# Patient Record
Sex: Male | Born: 1945 | Race: White | Hispanic: No | Marital: Married | Smoking: Former smoker
Health system: Southern US, Community
[De-identification: ages and names within clinical notes are randomized; demographics above are authoritative.]

## PROBLEM LIST (undated history)

## (undated) DIAGNOSIS — I739 Peripheral vascular disease, unspecified: Secondary | ICD-10-CM

## (undated) DIAGNOSIS — K036 Deposits [accretions] on teeth: Secondary | ICD-10-CM

## (undated) DIAGNOSIS — L02416 Cutaneous abscess of left lower limb: Secondary | ICD-10-CM

## (undated) DIAGNOSIS — I10 Essential (primary) hypertension: Secondary | ICD-10-CM

## (undated) DIAGNOSIS — I6529 Occlusion and stenosis of unspecified carotid artery: Secondary | ICD-10-CM

## (undated) DIAGNOSIS — K08109 Complete loss of teeth, unspecified cause, unspecified class: Secondary | ICD-10-CM

## (undated) DIAGNOSIS — E278 Other specified disorders of adrenal gland: Secondary | ICD-10-CM

## (undated) DIAGNOSIS — D329 Benign neoplasm of meninges, unspecified: Secondary | ICD-10-CM

## (undated) DIAGNOSIS — M1990 Unspecified osteoarthritis, unspecified site: Secondary | ICD-10-CM

## (undated) DIAGNOSIS — K031 Abrasion of teeth: Secondary | ICD-10-CM

## (undated) DIAGNOSIS — T7840XA Allergy, unspecified, initial encounter: Secondary | ICD-10-CM

## (undated) DIAGNOSIS — R0600 Dyspnea, unspecified: Secondary | ICD-10-CM

## (undated) DIAGNOSIS — N400 Enlarged prostate without lower urinary tract symptoms: Secondary | ICD-10-CM

## (undated) DIAGNOSIS — K0530 Chronic periodontitis, unspecified: Secondary | ICD-10-CM

## (undated) DIAGNOSIS — Z96652 Presence of left artificial knee joint: Secondary | ICD-10-CM

## (undated) DIAGNOSIS — I5032 Chronic diastolic (congestive) heart failure: Secondary | ICD-10-CM

## (undated) DIAGNOSIS — K029 Dental caries, unspecified: Secondary | ICD-10-CM

## (undated) DIAGNOSIS — G8929 Other chronic pain: Secondary | ICD-10-CM

## (undated) DIAGNOSIS — I714 Abdominal aortic aneurysm, without rupture, unspecified: Secondary | ICD-10-CM

## (undated) DIAGNOSIS — N138 Other obstructive and reflux uropathy: Secondary | ICD-10-CM

## (undated) DIAGNOSIS — I5033 Acute on chronic diastolic (congestive) heart failure: Secondary | ICD-10-CM

## (undated) DIAGNOSIS — J449 Chronic obstructive pulmonary disease, unspecified: Secondary | ICD-10-CM

## (undated) DIAGNOSIS — Z0120 Encounter for dental examination and cleaning without abnormal findings: Secondary | ICD-10-CM

## (undated) DIAGNOSIS — G4733 Obstructive sleep apnea (adult) (pediatric): Secondary | ICD-10-CM

## (undated) DIAGNOSIS — D126 Benign neoplasm of colon, unspecified: Secondary | ICD-10-CM

## (undated) DIAGNOSIS — I639 Cerebral infarction, unspecified: Secondary | ICD-10-CM

## (undated) DIAGNOSIS — M549 Dorsalgia, unspecified: Secondary | ICD-10-CM

## (undated) DIAGNOSIS — I2510 Atherosclerotic heart disease of native coronary artery without angina pectoris: Secondary | ICD-10-CM

## (undated) DIAGNOSIS — R351 Nocturia: Secondary | ICD-10-CM

## (undated) DIAGNOSIS — K032 Erosion of teeth: Secondary | ICD-10-CM

## (undated) DIAGNOSIS — G4730 Sleep apnea, unspecified: Secondary | ICD-10-CM

## (undated) DIAGNOSIS — M5030 Other cervical disc degeneration, unspecified cervical region: Secondary | ICD-10-CM

## (undated) DIAGNOSIS — K5790 Diverticulosis of intestine, part unspecified, without perforation or abscess without bleeding: Secondary | ICD-10-CM

## (undated) DIAGNOSIS — Z9889 Other specified postprocedural states: Secondary | ICD-10-CM

## (undated) DIAGNOSIS — R011 Cardiac murmur, unspecified: Secondary | ICD-10-CM

## (undated) DIAGNOSIS — K030 Excessive attrition of teeth: Secondary | ICD-10-CM

## (undated) DIAGNOSIS — N401 Enlarged prostate with lower urinary tract symptoms: Secondary | ICD-10-CM

## (undated) DIAGNOSIS — M545 Low back pain, unspecified: Secondary | ICD-10-CM

## (undated) DIAGNOSIS — I350 Nonrheumatic aortic (valve) stenosis: Secondary | ICD-10-CM

## (undated) DIAGNOSIS — H9319 Tinnitus, unspecified ear: Secondary | ICD-10-CM

## (undated) DIAGNOSIS — M109 Gout, unspecified: Secondary | ICD-10-CM

## (undated) HISTORY — DX: Complete loss of teeth, unspecified cause, unspecified class: K08.109

## (undated) HISTORY — DX: Excessive attrition of teeth: K03.0

## (undated) HISTORY — DX: Deposits (accretions) on teeth: K03.6

## (undated) HISTORY — DX: Gout, unspecified: M10.9

## (undated) HISTORY — DX: Other specified postprocedural states: Z98.890

## (undated) HISTORY — DX: Obstructive sleep apnea (adult) (pediatric): G47.33

## (undated) HISTORY — DX: Occlusion and stenosis of unspecified carotid artery: I65.29

## (undated) HISTORY — DX: Essential (primary) hypertension: I10

## (undated) HISTORY — DX: Abrasion of teeth: K03.1

## (undated) HISTORY — DX: Other obstructive and reflux uropathy: N13.8

## (undated) HISTORY — DX: Presence of left artificial knee joint: Z96.652

## (undated) HISTORY — DX: Low back pain, unspecified: M54.50

## (undated) HISTORY — PX: TRANSURETHRAL RESECTION OF PROSTATE: SHX73

## (undated) HISTORY — DX: Sleep apnea, unspecified: G47.30

## (undated) HISTORY — PX: APPENDECTOMY: SHX54

## (undated) HISTORY — DX: Unspecified osteoarthritis, unspecified site: M19.90

## (undated) HISTORY — DX: Benign neoplasm of meninges, unspecified: D32.9

## (undated) HISTORY — DX: Chronic periodontitis, unspecified: K05.30

## (undated) HISTORY — DX: Other cervical disc degeneration, unspecified cervical region: M50.30

## (undated) HISTORY — DX: Chronic diastolic (congestive) heart failure: I50.32

## (undated) HISTORY — DX: Erosion of teeth: K03.2

## (undated) HISTORY — DX: Atherosclerotic heart disease of native coronary artery without angina pectoris: I25.10

## (undated) HISTORY — DX: Other obstructive and reflux uropathy: N40.1

## (undated) HISTORY — PX: EYE SURGERY: SHX253

## (undated) HISTORY — DX: Diverticulosis of intestine, part unspecified, without perforation or abscess without bleeding: K57.90

## (undated) HISTORY — DX: Benign prostatic hyperplasia without lower urinary tract symptoms: N40.0

## (undated) HISTORY — DX: Cerebral infarction, unspecified: I63.9

## (undated) HISTORY — DX: Acute on chronic diastolic (congestive) heart failure: I50.33

## (undated) HISTORY — PX: CARPAL TUNNEL RELEASE: SHX101

## (undated) HISTORY — PX: TONSILLECTOMY: SUR1361

## (undated) HISTORY — PX: INGUINAL HERNIA REPAIR: SUR1180

## (undated) HISTORY — DX: Allergy, unspecified, initial encounter: T78.40XA

## (undated) HISTORY — DX: Encounter for dental examination and cleaning without abnormal findings: Z01.20

## (undated) HISTORY — DX: Benign neoplasm of colon, unspecified: D12.6

## (undated) HISTORY — DX: Dental caries, unspecified: K02.9

## (undated) HISTORY — PX: PROSTATE SURGERY: SHX751

## (undated) HISTORY — DX: Peripheral vascular disease, unspecified: I73.9

## (undated) HISTORY — DX: Tinnitus, unspecified ear: H93.19

## (undated) HISTORY — DX: Chronic obstructive pulmonary disease, unspecified: J44.9

## (undated) HISTORY — PX: OTHER SURGICAL HISTORY: SHX169

---

## 1989-06-05 HISTORY — PX: BRAIN MENINGIOMA EXCISION: SHX576

## 2006-05-21 ENCOUNTER — Ambulatory Visit: Payer: Self-pay | Admitting: Internal Medicine

## 2006-05-31 ENCOUNTER — Encounter (INDEPENDENT_AMBULATORY_CARE_PROVIDER_SITE_OTHER): Payer: Self-pay | Admitting: Specialist

## 2006-05-31 ENCOUNTER — Ambulatory Visit: Payer: Self-pay | Admitting: Internal Medicine

## 2009-03-31 ENCOUNTER — Encounter (INDEPENDENT_AMBULATORY_CARE_PROVIDER_SITE_OTHER): Payer: Self-pay | Admitting: *Deleted

## 2009-06-05 HISTORY — PX: UMBILICAL HERNIA REPAIR: SHX196

## 2010-01-06 ENCOUNTER — Encounter (INDEPENDENT_AMBULATORY_CARE_PROVIDER_SITE_OTHER): Payer: Self-pay | Admitting: *Deleted

## 2010-03-17 DIAGNOSIS — F32A Depression, unspecified: Secondary | ICD-10-CM

## 2010-03-17 DIAGNOSIS — R5381 Other malaise: Secondary | ICD-10-CM | POA: Insufficient documentation

## 2010-03-17 DIAGNOSIS — Z860100 Personal history of colon polyps, unspecified: Secondary | ICD-10-CM

## 2010-03-17 DIAGNOSIS — R1310 Dysphagia, unspecified: Secondary | ICD-10-CM | POA: Insufficient documentation

## 2010-03-17 DIAGNOSIS — F329 Major depressive disorder, single episode, unspecified: Secondary | ICD-10-CM | POA: Insufficient documentation

## 2010-03-17 DIAGNOSIS — K5730 Diverticulosis of large intestine without perforation or abscess without bleeding: Secondary | ICD-10-CM

## 2010-03-17 DIAGNOSIS — R5383 Other fatigue: Secondary | ICD-10-CM

## 2010-03-17 HISTORY — DX: Depression, unspecified: F32.A

## 2010-03-17 HISTORY — DX: Personal history of colonic polyps: Z86.010

## 2010-03-17 HISTORY — DX: Diverticulosis of large intestine without perforation or abscess without bleeding: K57.30

## 2010-03-17 HISTORY — DX: Personal history of colon polyps, unspecified: Z86.0100

## 2010-07-05 NOTE — Procedures (Signed)
Summary: COLON   Colonoscopy  Procedure date:  05/31/2006  Findings:      Location:  [facility].    Procedures Next Due Date:    Colonoscopy: 06/2009 Patient Name: Kyle Lewis, Kyle Lewis MRN:  Procedure Procedures: Colonoscopy CPT: 40102.    with polypectomy. CPT: A3573898.  Personnel: Endoscopist: Talya Quain L. Juanda Chance, MD.  Referred By: [name], MD.  Exam Location: Exam performed in Outpatient Clinic. Outpatient  Patient Consent: Procedure, Alternatives, Risks and Benefits discussed, consent obtained, from patient. Consent was obtained by the RN.  Indications  Average Risk Screening Routine.  History  Current Medications: Patient is not currently taking Coumadin.  Pre-Exam Physical: Performed May 31, 2006. Entire physical exam was normal.  Comments: Pt. history reviewed/updated, physical exam performed prior to initiation of sedation?yes Exam Exam: Extent of exam reached: Cecum, extent intended: Cecum.  ASA Classification: II. Tolerance: good.  Monitoring: Pulse and BP monitoring, Oximetry used. Supplemental O2 given.  Colon Prep Used Miralax for colon prep. Prep results: fair, adequate exam.  Sedation Meds: Patient assessed and found to be appropriate for moderate (conscious) sedation. Fentanyl 100 mcg. given IV. Versed 10 mg. given IV.  Findings - MULTIPLE POLYPS: Cecum. minimum size 4 mm, maximum size 5 mm. removed, Polyp retrieved, 2 polyps Polyps sent to pathology. ICD9: Colon Polyps: 211.3. Comments: 2 sessile polyps at the cecum, removed with hot biopsies.  - NORMAL EXAM: Cecum.  POLYP: Sigmoid Colon, Maximum size: 15 mm. pedunculated polyp. Distance from Anus 30 cm. Procedure:  snare with cautery, removed, retrieved, Polyp sent to pathology. ICD9: Colon Polyps: 211.3.  - DIVERTICULOSIS: Sigmoid Colon. ICD9: Diverticulosis: 562.10. Comments: mild diverticulosis.  - NORMAL EXAM: Rectum.    Comments: scope removal time  10:49  min Assessment Abnormal examination, see findings above.  Diagnoses: 211.3: Colon Polyps.  562.10: Diverticulosis.   Comments: multiple left and right colon polyps removed Events  Unplanned Interventions: No intervention was required.  Unplanned Events: There were no complications. Plans  Post Exam Instructions: No aspirin or non-steroidal containing medications: 2 weeks.  Medication Plan: Await pathology.  Patient Education: Patient given standard instructions for: Patient instructed to get routine colonoscopy every 3 years.  Disposition: After procedure patient sent to recovery. After recovery patient sent home.   This report was created from the original endoscopy report, which was reviewed and signed by the above listed endoscopist.     FINAL DIAGNOSIS    ***MICROSCOPIC EXAMINATION AND DIAGNOSIS***    1. COLON, CECAL POLYPS: TUBULAR ADENOMAS. NO HIGH GRADE   DYSPLASIA OR MALIGNANCY IDENTIFIED.    2. COLON, SIGMOID POLYP AT 30 CM: TUBULOVILLOUS ADENOMA. NO   HIGH GRADE DYSPLASIA OR MALIGNANCY IDENTIFIED.    cc   Date Reported: 06/04/2006 Beulah Gandy. Luisa Hart, MD   *** Electronically Signed Out By JDP ***    Clinical information   Screening. R/O adenoma (mj)    specimen(s) obtained   1: Colon, polyp(s), cecum   2: Colon, polyp(s), sigmoid    Gross Description   1. Received in formalin are tan, soft tissue fragments that are   submitted in toto. Number: two.   Size: 0.3 and 0.4 cm    2. Received in formalin is a polypoid portion of soft dark   reddish brown tissue with a smooth external surface that measures   1 x 0.9 x 0.8 cm. The base involves an area measuring 0.5 cm.   Block Summary: The specimen is bisected and submitted entirely in   one cassette.  JBM:mw 06/01/06    mw/

## 2010-07-05 NOTE — Letter (Signed)
Summary: [facility]   Imported By: Lamona Curl CMA (AAMA) 03/18/2010 15:04:40  _____________________________________________________________________  External Attachment:    Type:   Image     Comment:   External Document

## 2011-06-06 DIAGNOSIS — I639 Cerebral infarction, unspecified: Secondary | ICD-10-CM

## 2011-06-06 HISTORY — DX: Cerebral infarction, unspecified: I63.9

## 2011-07-13 ENCOUNTER — Telehealth: Payer: Self-pay | Admitting: Internal Medicine

## 2011-07-13 NOTE — Telephone Encounter (Signed)
Spoke with patient's wife. States he has a bulge where he had mesh put in for umbilical hernia and wants Dr. Juanda Chance to look at it the same time his wife is coming on 08/04/11. Scheduled patient at 3:15 PM on 08/04/11.

## 2011-07-13 NOTE — Telephone Encounter (Signed)
Line busy - will try again later

## 2011-07-19 ENCOUNTER — Encounter: Payer: Self-pay | Admitting: Internal Medicine

## 2011-07-27 ENCOUNTER — Encounter: Payer: Self-pay | Admitting: *Deleted

## 2011-08-04 ENCOUNTER — Ambulatory Visit (INDEPENDENT_AMBULATORY_CARE_PROVIDER_SITE_OTHER): Payer: Medicare Other | Admitting: Internal Medicine

## 2011-08-04 ENCOUNTER — Encounter: Payer: Self-pay | Admitting: Internal Medicine

## 2011-08-04 DIAGNOSIS — Z86010 Personal history of colonic polyps: Secondary | ICD-10-CM

## 2011-08-04 DIAGNOSIS — K439 Ventral hernia without obstruction or gangrene: Secondary | ICD-10-CM

## 2011-08-04 DIAGNOSIS — Z1211 Encounter for screening for malignant neoplasm of colon: Secondary | ICD-10-CM

## 2011-08-04 MED ORDER — PEG-KCL-NACL-NASULF-NA ASC-C 100 G PO SOLR: 1.0000 | Freq: Once | ORAL | Status: DC

## 2011-08-04 NOTE — Progress Notes (Signed)
Kyle Lewis 1946/03/30 MRN [id number]    History of Present Illness:  This is a 66 year old white male with a large umbilical hernia. He is here to discuss the hernia as well as discuss a recall colonoscopy. About 3 years ago, he underwent repair of a small umbilical hernia. About 1 week after his discharge, he developed severe abdominal pain and projectile vomiting and was admitted for a small bowel obstruction. He underwent  exploratory laparotomy for incarcerated hernia and placement of mesh in the anterior abdominal wall. Almost immediately after the second surgery, he developed a recurrent hernia which has  Persisted and has enlarged somewhat. He has a history of adenomatous polyps of the colon on a colonoscopy in December 2007. One was a tubular adenoma and one was a tubovillous adenoma. He is due for a recall colonoscopy. He denies any abdominal pain, nausea, vomiting or change in bowel habits.    Past Medical History  Diagnosis Date  . Diverticulosis   . Adenomatous colon polyp   . Sleep apnea    Past Surgical History  Procedure Date  . Umbilical hernia repair   . Tonsillectomy   . Appendectomy   . Brain meningioma excision   . Carpal tunnel release     left    reports that he has been smoking.  He has never used smokeless tobacco. He reports that he drinks alcohol. He reports that he does not use illicit drugs. family history includes Prostate cancer in his maternal grandfather.  There is no history of Colon cancer. No Known Allergies      Review of Systems: Denies heartburn, nausea vomiting or diarrhea  The remainder of the 10 point ROS is negative except as outlined in H&P   Physical Exam: General appearance  Well developed, in no distress. Obese Eyes- non icteric. HEENT nontraumatic, normocephalic. Mouth no lesions, tongue papillated, no cheilosis. Neck supple without adenopathy, thyroid not enlarged, no carotid bruits, no JVD. Lungs Clear to auscultation  bilaterally. Cor normal S1, normal S2, regular rhythm, no murmur,  quiet precordium. Abdomen: Large vertical scar with large ventral hernia 8 cm in diameter easily reducible. Nontender. Bowel sounds are normal active. Noted there is no tympany. Rectal: Not done Extremities no pedal edema. Skin no lesions. Neurological alert and oriented x 3. Psychological normal mood and affect.  Assessment and Plan:  Problem #1 History of adenomatous polyps. Patient is due for a recall colonoscopy which will be scheduled.  Problem #2 Recurrent ventral hernia. He is status post umbilical hernia repair about 4 years ago  and placement of mesh for incarcerated hernia immediately following the first surgery.. I advised patient not to rush into another surgery since here is no evidence of obstruction or incarceration. He is morbidly obese  and another surgery to repair the hernia  would be very complicated. I strongly advised him to lose weight and wear an  abdominal binder.    08/04/2011 Kyle Lewis

## 2011-08-04 NOTE — Patient Instructions (Addendum)
You have been scheduled for a colonoscopy with propofol. Please follow written instructions given to you at your visit today.  Please pick up your prep kit at the pharmacy within the next 1-3 days. Dr Kathie Rhodes. [employer]

## 2011-08-05 ENCOUNTER — Encounter: Payer: Self-pay | Admitting: Internal Medicine

## 2011-08-07 ENCOUNTER — Encounter: Payer: Self-pay | Admitting: Internal Medicine

## 2011-09-06 ENCOUNTER — Encounter: Payer: Self-pay | Admitting: Internal Medicine

## 2011-09-06 ENCOUNTER — Ambulatory Visit (AMBULATORY_SURGERY_CENTER): Payer: Medicare Other | Admitting: Internal Medicine

## 2011-09-06 VITALS — BP 152/113 | HR 66 | Temp 97.2°F | Resp 22 | Ht 66.0 in | Wt 273.0 lb

## 2011-09-06 DIAGNOSIS — Z1211 Encounter for screening for malignant neoplasm of colon: Secondary | ICD-10-CM

## 2011-09-06 DIAGNOSIS — D126 Benign neoplasm of colon, unspecified: Secondary | ICD-10-CM

## 2011-09-06 MED ORDER — SODIUM CHLORIDE 0.9 % IV SOLN: 500.0000 mL | INTRAVENOUS | Status: DC

## 2011-09-06 NOTE — Progress Notes (Signed)
Patient did not experience any of the following events: a burn prior to discharge; a fall within the facility; wrong site/side/patient/procedure/implant event; or a hospital transfer or hospital admission upon discharge from the facility. (G8907) Patient did not have preoperative order for IV antibiotic SSI prophylaxis. (G8918)  

## 2011-09-06 NOTE — Progress Notes (Signed)
The pt tolerated the colonoscopy very well. Maw   

## 2011-09-06 NOTE — Op Note (Signed)
[facility] [address] [place], [place]  [address]  COLONOSCOPY PROCEDURE REPORT  PATIENT:  Kyle, Lewis  MR#:  [id number] BIRTHDATE:  Apr 11, 1946, 65 yrs. old  GENDER:  male ENDOSCOPIST:  Hedwig Morton. Juanda Chance, MD REF. BY:  [name], M.D. PROCEDURE DATE:  09/06/2011 PROCEDURE:  Colonoscopy with biopsy and snare polypectomy ASA CLASS:  Class II INDICATIONS:  history of pre-cancerous (adenomatous) colon polyps aden.polyp 2008 MEDICATIONS:   MAC sedation, administered by CRNA, propofol (Diprivan) 250 mg  DESCRIPTION OF PROCEDURE:   After the risks and benefits and of the procedure were explained, informed consent was obtained. Digital rectal exam was performed and revealed no rectal masses. The LB 180AL [id number] endoscope was introduced through the anus and advanced to the cecum, which was identified by both the appendix and ileocecal valve.  The quality of the prep was good, using MoviPrep.  The instrument was then slowly withdrawn as the colon was fully examined. <<PROCEDUREIMAGES>>  FINDINGS:  Four polyps were found. 2 polyps 5 mm at 30 cm, 1 polyp 8 mm at 15 cm, 1 polyp 18 mm at 65cm, al;l bu t one polyp not recovered The polyps were removed using cold biopsy forceps. Polyp was snared without cautery. Retrieval was successful. snare polyp Polyp was snared without cautery. Retrieval was unsuccessful (see image1, image3, and image12). snare polyp  Arteriovenous malformations were seen in the in the cecum (see image9, image6, image5, and image4). 3 flame shaped avm"s  Moderate diverticulosis was found in the sigmoid colon (see image2 and image11).  This was otherwise a normal examination of the colon (see image13, image7, image6, image3, and image4).   Retroflexed views in the rectum revealed no abnormalities.    The scope was then withdrawn from the patient and the procedure completed.  COMPLICATIONS:  None ENDOSCOPIC IMPRESSION: 1) Four polyps 2) AVM's in the  cecum 3) Moderate diverticulosis in the sigmoid colon 4) Otherwise normal examination RECOMMENDATIONS: 1) Await pathology results 2) High fiber diet.  REPEAT EXAM:  In 5 year(s) for.  ______________________________ Hedwig Morton. Juanda Chance, MD  CC:  n. eSIGNED:   Hedwig Morton. Travell Desaulniers at 09/06/2011 03:19 PM  Holley Bouche, [id number]

## 2011-09-06 NOTE — Patient Instructions (Signed)
YOU HAD AN ENDOSCOPIC PROCEDURE TODAY AT THE [facility]: Refer to the procedure report that was given to you for any specific questions about what was found during the examination.  If the procedure report does not answer your questions, please call your gastroenterologist to clarify.  If you requested that your care partner not be given the details of your procedure findings, then the procedure report has been included in a sealed envelope for you to review at your convenience later.  YOU SHOULD EXPECT: Some feelings of bloating in the abdomen. Passage of more gas than usual.  Walking can help get rid of the air that was put into your GI tract during the procedure and reduce the bloating. If you had a lower endoscopy (such as a colonoscopy or flexible sigmoidoscopy) you may notice spotting of blood in your stool or on the toilet paper. If you underwent a bowel prep for your procedure, then you may not have a normal bowel movement for a few days.  DIET: Your first meal following the procedure should be a light meal and then it is ok to progress to your normal diet.  A half-sandwich or bowl of soup is an example of a good first meal.  Heavy or fried foods are harder to digest and may make you feel nauseous or bloated.  Likewise meals heavy in dairy and vegetables can cause extra gas to form and this can also increase the bloating.  Drink plenty of fluids but you should avoid alcoholic beverages for 24 hours.  ACTIVITY: Your care partner should take you home directly after the procedure.  You should plan to take it easy, moving slowly for the rest of the day.  You can resume normal activity the day after the procedure however you should NOT DRIVE or use heavy machinery for 24 hours (because of the sedation medicines used during the test).    SYMPTOMS TO REPORT IMMEDIATELY: A gastroenterologist can be reached at any hour.  During normal business hours, 8:30 AM to 5:00 PM Monday through Friday,  call [phone].  After hours and on weekends, please call the GI answering service at [phone] who will take a message and have the physician on call contact you.   Following lower endoscopy (colonoscopy or flexible sigmoidoscopy):  Excessive amounts of blood in the stool  Significant tenderness or worsening of abdominal pains  Swelling of the abdomen that is new, acute  Fever of 100F or higher    FOLLOW UP: If any biopsies were taken you will be contacted by phone or by letter within the next 1-3 weeks.  Call your gastroenterologist if you have not heard about the biopsies in 3 weeks.  Our staff will call the home number listed on your records the next business day following your procedure to check on you and address any questions or concerns that you may have at that time regarding the information given to you following your procedure. This is a courtesy call and so if there is no answer at the home number and we have not heard from you through the emergency physician on call, we will assume that you have returned to your regular daily activities without incident.  SIGNATURES/CONFIDENTIALITY: You and/or your care partner have signed paperwork which will be entered into your electronic medical record.  These signatures attest to the fact that that the information above on your After Visit Summary has been reviewed and is understood.  Full responsibility of the confidentiality   of this discharge information lies with you and/or your care-partner.     

## 2011-09-07 ENCOUNTER — Telehealth: Payer: Self-pay | Admitting: *Deleted

## 2011-09-07 NOTE — Telephone Encounter (Signed)
  Follow up Call-  Call back number 09/06/2011  Post procedure Call Back phone  # cell [phone], house [phone]  Permission to leave phone message Yes     Patient questions:  Do you have a fever, pain , or abdominal swelling? no Pain Score  0 *  Have you tolerated food without any problems? yes  Have you been able to return to your normal activities? yes  Do you have any questions about your discharge instructions: Diet   no Medications  no Follow up visit  no  Do you have questions or concerns about your Care? no  Actions: * If pain score is 4 or above: No action needed, pain <4. Pt states tell dr Juanda Chance i appreciate everything. ewm

## 2011-09-12 ENCOUNTER — Encounter: Payer: Self-pay | Admitting: Internal Medicine

## 2011-09-19 LAB — HM COLONOSCOPY

## 2012-03-06 ENCOUNTER — Other Ambulatory Visit: Payer: Self-pay | Admitting: Orthopedic Surgery

## 2012-03-06 MED ORDER — BUPIVACAINE 0.25 % ON-Q PUMP SINGLE CATH 300ML: 300.0000 mL | INJECTION | Status: DC

## 2012-03-06 MED ORDER — DEXAMETHASONE SODIUM PHOSPHATE 10 MG/ML IJ SOLN: 10.0000 mg | Freq: Once | INTRAMUSCULAR | Status: DC

## 2012-03-06 NOTE — Progress Notes (Signed)
Preoperative surgical orders have been place into the Epic hospital system for Holley Bouche on 03/06/2012, 9:17 AM  by Patrica Duel for surgery on 05/17/2012.  Preop Total Knee orders including Bupivacaine On-Q pump, IV Tylenol, and IV Decadron as long as there are no contraindications to the above medications. Avel Peace, PA-C

## 2012-03-07 ENCOUNTER — Other Ambulatory Visit: Payer: Self-pay | Admitting: Orthopedic Surgery

## 2012-03-07 MED ORDER — DEXAMETHASONE SODIUM PHOSPHATE 10 MG/ML IJ SOLN: 10.0000 mg | Freq: Once | INTRAMUSCULAR | Status: DC

## 2012-03-07 MED ORDER — BUPIVACAINE 0.25 % ON-Q PUMP SINGLE CATH 300ML: 300.0000 mL | INJECTION | Status: DC

## 2012-03-21 ENCOUNTER — Encounter (HOSPITAL_COMMUNITY): Payer: Self-pay | Admitting: Pharmacy Technician

## 2012-03-25 ENCOUNTER — Ambulatory Visit (HOSPITAL_COMMUNITY)
Admission: RE | Admit: 2012-03-25 | Discharge: 2012-03-25 | Disposition: A | Discharge status: Home / Self Care | Payer: Medicare Other | Source: Ambulatory Visit | Attending: Orthopedic Surgery | Admitting: Orthopedic Surgery

## 2012-03-25 ENCOUNTER — Encounter (HOSPITAL_COMMUNITY): Payer: Self-pay

## 2012-03-25 ENCOUNTER — Encounter (HOSPITAL_COMMUNITY)
Admission: RE | Admit: 2012-03-25 | Discharge: 2012-03-25 | Disposition: A | Discharge status: Home / Self Care | Payer: Medicare Other | Source: Ambulatory Visit | Attending: Orthopedic Surgery | Admitting: Orthopedic Surgery

## 2012-03-25 DIAGNOSIS — M1710 Unilateral primary osteoarthritis, unspecified knee: Secondary | ICD-10-CM | POA: Insufficient documentation

## 2012-03-25 DIAGNOSIS — Z01812 Encounter for preprocedural laboratory examination: Secondary | ICD-10-CM | POA: Insufficient documentation

## 2012-03-25 DIAGNOSIS — I700 Atherosclerosis of aorta: Secondary | ICD-10-CM | POA: Insufficient documentation

## 2012-03-25 HISTORY — DX: Nocturia: R35.1

## 2012-03-25 HISTORY — DX: Other chronic pain: G89.29

## 2012-03-25 HISTORY — DX: Other specified disorders of adrenal gland: E27.8

## 2012-03-25 HISTORY — DX: Dorsalgia, unspecified: M54.9

## 2012-03-25 LAB — COMPREHENSIVE METABOLIC PANEL
ALT: 25 U/L (ref 0–53)
AST: 19 U/L (ref 0–37)
Albumin: 4 g/dL (ref 3.5–5.2)
Alkaline Phosphatase: 93 U/L (ref 39–117)
BUN: 11 mg/dL (ref 6–23)
CO2: 30 mEq/L (ref 19–32)
Calcium: 9.7 mg/dL (ref 8.4–10.5)
Chloride: 101 mEq/L (ref 96–112)
Creatinine, Ser: 0.56 mg/dL (ref 0.50–1.35)
GFR calc Af Amer: >90 mL/min (ref >90)
GFR calc non Af Amer: >90 mL/min (ref >90)
Glucose, Bld: 118 mg/dL — ABNORMAL HIGH (ref 70–99)
Potassium: 4.2 mEq/L (ref 3.5–5.1)
Sodium: 139 mEq/L (ref 135–145)
Total Bilirubin: 0.4 mg/dL (ref 0.3–1.2)
Total Protein: 7.1 g/dL (ref 6.0–8.3)

## 2012-03-25 LAB — URINALYSIS, ROUTINE W REFLEX MICROSCOPIC
Bilirubin Urine: NEGATIVE
Glucose, UA: NEGATIVE mg/dL
Hgb urine dipstick: NEGATIVE
Ketones, ur: NEGATIVE mg/dL
Leukocytes, UA: NEGATIVE
Nitrite: NEGATIVE
Protein, ur: NEGATIVE mg/dL
Specific Gravity, Urine: 1.015 (ref 1.005–1.030)
Urobilinogen, UA: 0.2 mg/dL (ref 0.0–1.0)
pH: 5 (ref 5.0–8.0)

## 2012-03-25 LAB — APTT: aPTT: 28 seconds (ref 24–37)

## 2012-03-25 LAB — CBC
HCT: 45.1 % (ref 39.0–52.0)
Hemoglobin: 16 g/dL (ref 13.0–17.0)
MCH: 32.1 pg (ref 26.0–34.0)
MCHC: 35.5 g/dL (ref 30.0–36.0)
MCV: 90.6 fL (ref 78.0–100.0)
Platelets: 276 10*3/uL (ref 150–400)
RBC: 4.98 MIL/uL (ref 4.22–5.81)
RDW: 13.3 % (ref 11.5–15.5)
WBC: 10.3 10*3/uL (ref 4.0–10.5)

## 2012-03-25 LAB — SURGICAL PCR SCREEN
MRSA, PCR: POSITIVE — AB
Staphylococcus aureus: POSITIVE — AB

## 2012-03-25 LAB — PROTIME-INR
INR: 0.94 (ref 0.00–1.49)
Prothrombin Time: 12.5 seconds (ref 11.6–15.2)

## 2012-03-25 MED ORDER — CHLORHEXIDINE GLUCONATE 4 % EX LIQD
60.0000 mL | Freq: Once | CUTANEOUS | Status: DC
  Filled 2012-03-25: qty 60

## 2012-03-25 NOTE — Patient Instructions (Addendum)
Quadry Kampa  03/25/2012                           YOUR PROCEDURE IS SCHEDULED ON: 04/01/12 AT 11:15 AM               PLEASE REPORT TO SHORT STAY CENTER AT :  8:45 AM               CALL THIS NUMBER IF ANY PROBLEMS THE DAY OF SURGERY :               [phone]                      REMEMBER:   Do not eat food or drink liquids AFTER MIDNIGHT    Take these medicines the morning of surgery with A SIP OF WATER: PERCOCET   Do not wear jewelry, make-up   Do not wear lotions, powders, or perfumes.   Do not shave legs or underarms 12 hrs. before surgery (men may shave face)  Do not bring valuables to the hospital.  Contacts, dentures or bridgework may not be worn into surgery.  Leave suitcase in the car. After surgery it may be brought to your room.  For patients admitted to the hospital more than one night, checkout time is 11:00                          The day of discharge.   Patients discharged the day of surgery will not be allowed to drive home                             If going home same day of surgery, must have someone stay with you first                           24 hrs at home and arrange for some one to drive you home from hospital.    Special Instructions:   Please read over the following fact sheets that you were given:               1. MRSA  INFORMATION                      2. [facility] PREPARING FOR SURGERY SHEET                    3. INCENTIVE SPIROMETER               4. BRING C-PAP MASK AND TUBING TO HOSPITAL                                                X_____________________________________________________________________

## 2012-03-31 ENCOUNTER — Other Ambulatory Visit: Payer: Self-pay | Admitting: Orthopedic Surgery

## 2012-03-31 NOTE — H&P (Signed)
Kyle Lewis  DOB: 07/10/1945 Married / Language: English / Race: White Male  Date of Admission:  04/01/2012  Chief Complaint:  Left Knee Pain  History of Present Illness The patient is a 65 year old male who comes in for a preoperative History and Physical. The patient is scheduled for a left total knee arthroplasty to be performed by Dr. Frank V. Aluisio, MD at [facility]. The patient is a 65 year old male who presents with knee complaints. The patient reports left knee symptoms including: pain, instability, giving way and weakness which began year(s) ago without any known injury. The patient describes the severity of the symptoms as severe.The patient feels that the symptoms are worsening. The patient has the current diagnosis of knee osteoarthritis. Prior to being seen today the patient was previously evaluated in this clinic (Dr. Aplington). Previous work-up for this problem has included knee x-rays. Past treatment for this problem has included intra-articular injection of corticosteroids (as well as Euflexxa. The Euflexxa didn't really help at all). The patient was last seen in clinic 7 month(s) ago (had cortisone injection then). Current treatment includes knee brace and opioid analgesics (taking Oxycodone 10mg, mostly for his back pain). He complains of pain at all times. He states that he's been unable to walk recently because of increased pain. Recently, he had a marked increase in acute onset of low back pain. This made it almost impossible for him to get around. Dr. Ramos did an injection and put him on analgesics, and the back is better, but the knee is what is bothering him the most. He states that he needs his knee replaced and wants me to do it immediately.  He is definitely ready to proceed with surgery. They have been treated conservatively in the past for the above stated problem and despite conservative measures, they continue to have progressive pain  and severe functional limitations and dysfunction. They have failed non-operative management including home exercise, medications, and injections. It is felt that they would benefit from undergoing total joint replacement. Risks and benefits of the procedure have been discussed with the patient and they elect to proceed with surgery. There are no active contraindications to surgery such as ongoing infection or rapidly progressive neurological disease.  Problem List Osteoarthrosis NOS, lower leg (715.96). 10/05/2010   Allergies No Known Drug Allergies   Family History Heart Disease. mother, father and grandfather fathers side, mother and father Diabetes Mellitus. father Hypertension. mother Osteoarthritis. mother Cancer. grandfather mothers side Congestive Heart Failure. mother Chronic Obstructive Lung Disease. mother   Social History Children. 2 Alcohol use. former drinker current drinker; only occasionally per week Drug/Alcohol Rehab (Currently). no Current work status. working full time Drug/Alcohol Rehab (Previously). no Tobacco use. current every day smoker; smoke(d) 3 or more pack(s) per day smoke(d) 1 pack(s) per day Tobacco / smoke exposure. yes no Living situation. live with spouse Illicit drug use. no Pain Contract. no Marital status. married Previously in rehab. no Number of flights of stairs before winded. less than 1 Most recent primary occupation. truck driver Exercise. Exercises never   Medication History Tamsulosin HCl (0.4MG Capsule, Oral) Active. Losartan Potassium-HCTZ (100-25MG Tablet, Oral) Active. Finasteride (5MG Tablet, Oral) Active. Meloxicam ( Oral) Specific dose unknown - Active. OxyCODONE HCl (10MG Tablet, 1 (one) Tablet Oral four times daily, as needed, Taken starting 02/13/2012) Active. Omega-3 Fatty Acids (300MG Capsule, Oral) Active. Aspirin EC (81MG Tablet DR, Oral) Active.   Past Surgical  History Tonsillectomy Prostatectomy; Transurethral   Inguinal Hernia Repair. open: bilateral Carpal Tunnel Repair. right left Appendectomy Colon Polyp Removal - Colonoscopy Cataract Surgery. bilateral   Medical History High blood pressure Chronic Pain Osteoarthritis Anxiety Disorder Sleep Apnea. uses CPAP Hemorrhoids Scarlet Fever. Childhood Measles Mumps Diverticulosis   Review of Systems General:Not Present- Chills, Fever, Night Sweats, Fatigue, Weight Gain, Weight Loss and Memory Loss. Skin:Not Present- Hives, Itching, Rash, Eczema and Lesions. HEENT:Not Present- Tinnitus, Headache, Double Vision, Visual Loss, Hearing Loss and Dentures. Respiratory:Not Present- Shortness of breath with exertion, Shortness of breath at rest, Allergies, Coughing up blood and Chronic Cough. Cardiovascular:Not Present- Chest Pain, Racing/skipping heartbeats, Difficulty Breathing Lying Down, Murmur, Swelling and Palpitations. Gastrointestinal:Not Present- Bloody Stool, Heartburn, Abdominal Pain, Vomiting, Nausea, Constipation, Diarrhea, Difficulty Swallowing, Jaundice and Loss of appetitie. Male Genitourinary:Present- Urinary frequency and Urinating at Night. Not Present- Blood in Urine, Weak urinary stream, Discharge, Flank Pain, Incontinence, Painful Urination, Urgency and Urinary Retention. Musculoskeletal:Present- Muscle Weakness, Joint Pain and Back Pain. Not Present- Muscle Pain, Joint Swelling, Morning Stiffness and Spasms. Neurological:Not Present- Tremor, Dizziness, Blackout spells, Paralysis, Difficulty with balance and Weakness. Psychiatric:Not Present- Insomnia.   Vitals Weight: 260 lb Height: 67 in Body Surface Area: 2.36 m Body Mass Index: 40.72 kg/m Pulse: 64 (Regular) Resp.: 12 (Unlabored) BP: 138/58 (Sitting, Right Arm, Standard)    Physical Exam The physical exam findings are as follows:  Patient is a 65 year old male with continued knee  pain. Patient is accompanied today by with wife.   General Mental Status - Alert, cooperative and good historian. General Appearance- pleasant. Not in acute distress. Orientation- Oriented X3. Build & Nutrition- Well nourished and Well developed.   Head and Neck Head- normocephalic, atraumatic . Neck Global Assessment- bruit auscultated on the right, bruit auscultated on the left and supple.   Eye Pupil- Bilateral- Regular and Round. Motion- Bilateral- EOMI.   Chest and Lung Exam Auscultation: Breath sounds:- clear at anterior chest wall and - clear at posterior chest wall. Adventitious sounds:- No Adventitious sounds.   Cardiovascular Auscultation:Rhythm- Regular rate and rhythm. Heart Sounds- S1 WNL and S2 WNL. Murmurs & Other Heart Sounds: Murmur 1:Location- Aortic Area. Timing- Early systolic. Grade- II/VI. Character- Low pitched.   Abdomen Inspection:Contour- Generalized moderate distention. Palpation/Percussion:Tenderness- Abdomen is non-tender to palpation. Rigidity (guarding)- Abdomen is soft. Auscultation:Auscultation of the abdomen reveals - Bowel sounds normal.   Male Genitourinary Not done, not pertinent to present illness  Musculoskeletal On exam, well-developed male, alert and oriented, in no apparent distress. Evaluation of his hips shows normal ROM and no discomfort. Right knee exam shows range 0-135, no swelling, tenderness or instability, only slight crepitus on ROM. Left knee shows a significant varus deformity, range 5-115. Marked crepitus on ROM. Tender medial greater than lateral. No true laxity, just pseudolaxity due to the deformity. Pulses, sensation and motor are intact distally.  RADIOGRAPHS: AP of both knees and a lateral show severe advanced end stage arthritis of the left knee with significant tibial subluxation, bone on bone, all 3 compartments, but worse medially.  Assessment & Plan Osteoarthrosis  NOS, lower leg (715.96) Impression: Left Knee  Note: Patient is for a Left Total Knee Replacement by Dr. Aluisio.  Plan is to go to [facility].  Drew Perkins, PA-C  

## 2012-04-01 ENCOUNTER — Inpatient Hospital Stay (HOSPITAL_COMMUNITY): Payer: Medicare Other | Admitting: Anesthesiology

## 2012-04-01 ENCOUNTER — Encounter (HOSPITAL_COMMUNITY)
Admission: RE | Disposition: A | Discharge status: Skilled Nursing Facility | Payer: Self-pay | Source: Ambulatory Visit | Attending: Orthopedic Surgery

## 2012-04-01 ENCOUNTER — Encounter (HOSPITAL_COMMUNITY): Payer: Self-pay | Admitting: Anesthesiology

## 2012-04-01 ENCOUNTER — Encounter (HOSPITAL_COMMUNITY): Payer: Self-pay

## 2012-04-01 ENCOUNTER — Inpatient Hospital Stay (HOSPITAL_COMMUNITY)
Admission: RE | Admit: 2012-04-01 | Discharge: 2012-04-04 | DRG: 470 | Disposition: A | Discharge status: Skilled Nursing Facility | Payer: Medicare Other | Source: Ambulatory Visit | Attending: Orthopedic Surgery | Admitting: Orthopedic Surgery

## 2012-04-01 DIAGNOSIS — G4730 Sleep apnea, unspecified: Secondary | ICD-10-CM | POA: Diagnosis present

## 2012-04-01 DIAGNOSIS — M179 Osteoarthritis of knee, unspecified: Secondary | ICD-10-CM

## 2012-04-01 DIAGNOSIS — D62 Acute posthemorrhagic anemia: Secondary | ICD-10-CM | POA: Diagnosis not present

## 2012-04-01 DIAGNOSIS — Z6841 Body Mass Index (BMI) 40.0 and over, adult: Secondary | ICD-10-CM

## 2012-04-01 DIAGNOSIS — E876 Hypokalemia: Secondary | ICD-10-CM | POA: Diagnosis not present

## 2012-04-01 DIAGNOSIS — Z96659 Presence of unspecified artificial knee joint: Secondary | ICD-10-CM

## 2012-04-01 DIAGNOSIS — M1710 Unilateral primary osteoarthritis, unspecified knee: Principal | ICD-10-CM | POA: Diagnosis present

## 2012-04-01 DIAGNOSIS — I10 Essential (primary) hypertension: Secondary | ICD-10-CM | POA: Diagnosis present

## 2012-04-01 HISTORY — PX: TOTAL KNEE ARTHROPLASTY: SHX125

## 2012-04-01 HISTORY — PX: JOINT REPLACEMENT: SHX530

## 2012-04-01 HISTORY — DX: Osteoarthritis of knee, unspecified: M17.9

## 2012-04-01 LAB — TYPE AND SCREEN
ABO/RH(D): O POS
Antibody Screen: NEGATIVE

## 2012-04-01 LAB — ABO/RH: ABO/RH(D): O POS

## 2012-04-01 SURGERY — ARTHROPLASTY, KNEE, TOTAL
Anesthesia: Spinal | Site: Knee | Laterality: Left | Wound class: Clean

## 2012-04-01 MED ORDER — SODIUM CHLORIDE 0.9 % IV SOLN
INTRAVENOUS | Status: DC
  Administered 2012-04-01 – 2012-04-02 (×2): via INTRAVENOUS

## 2012-04-01 MED ORDER — CEFAZOLIN SODIUM-DEXTROSE 2-3 GM-% IV SOLR
2.0000 g | Freq: Four times a day (QID) | INTRAVENOUS | Status: AC
  Administered 2012-04-01 – 2012-04-02 (×2): 2 g via INTRAVENOUS
  Filled 2012-04-01 (×2): qty 50

## 2012-04-01 MED ORDER — DEXTROSE 5 % IV SOLN
500.0000 mg | Freq: Four times a day (QID) | INTRAVENOUS | Status: DC | PRN
  Administered 2012-04-01: 500 mg via INTRAVENOUS
  Filled 2012-04-01 (×2): qty 5

## 2012-04-01 MED ORDER — BISACODYL 10 MG RE SUPP: 10.0000 mg | Freq: Every day | RECTAL | Status: DC | PRN

## 2012-04-01 MED ORDER — SODIUM CHLORIDE 0.9 % IR SOLN
Status: DC | PRN
  Administered 2012-04-01: 1000 mL

## 2012-04-01 MED ORDER — DEXAMETHASONE 6 MG PO TABS
10.0000 mg | ORAL_TABLET | Freq: Once | ORAL | Status: AC
  Administered 2012-04-02: 10 mg via ORAL
  Filled 2012-04-01: qty 1

## 2012-04-01 MED ORDER — ONDANSETRON HCL 4 MG PO TABS: 4.0000 mg | ORAL_TABLET | Freq: Four times a day (QID) | ORAL | Status: DC | PRN

## 2012-04-01 MED ORDER — METOCLOPRAMIDE HCL 10 MG PO TABS: 5.0000 mg | ORAL_TABLET | Freq: Three times a day (TID) | ORAL | Status: DC | PRN

## 2012-04-01 MED ORDER — EPHEDRINE SULFATE 50 MG/ML IJ SOLN
INTRAMUSCULAR | Status: DC | PRN
  Administered 2012-04-01: 5 mg via INTRAVENOUS

## 2012-04-01 MED ORDER — HYDROMORPHONE HCL PF 1 MG/ML IJ SOLN: 0.2500 mg | INTRAMUSCULAR | Status: DC | PRN

## 2012-04-01 MED ORDER — PROPOFOL 10 MG/ML IV BOLUS
INTRAVENOUS | Status: DC | PRN
  Administered 2012-04-01 (×5): 20 mg via INTRAVENOUS

## 2012-04-01 MED ORDER — LACTATED RINGERS IV SOLN
INTRAVENOUS | Status: DC | PRN
  Administered 2012-04-01 (×2): via INTRAVENOUS

## 2012-04-01 MED ORDER — LACTATED RINGERS IV SOLN: INTRAVENOUS | Status: DC

## 2012-04-01 MED ORDER — BUPIVACAINE 0.25 % ON-Q PUMP SINGLE CATH 300ML
INJECTION | Status: DC | PRN
  Administered 2012-04-01: 300 mL

## 2012-04-01 MED ORDER — DEXAMETHASONE SODIUM PHOSPHATE 10 MG/ML IJ SOLN
INTRAMUSCULAR | Status: DC | PRN
  Administered 2012-04-01: 10 mg via INTRAVENOUS

## 2012-04-01 MED ORDER — DIPHENHYDRAMINE HCL 12.5 MG/5ML PO ELIX: 12.5000 mg | ORAL_SOLUTION | ORAL | Status: DC | PRN

## 2012-04-01 MED ORDER — METHOCARBAMOL 500 MG PO TABS
500.0000 mg | ORAL_TABLET | Freq: Four times a day (QID) | ORAL | Status: DC | PRN
  Administered 2012-04-02 – 2012-04-04 (×7): 500 mg via ORAL
  Filled 2012-04-01 (×8): qty 1

## 2012-04-01 MED ORDER — ACETAMINOPHEN 650 MG RE SUPP: 650.0000 mg | Freq: Four times a day (QID) | RECTAL | Status: DC | PRN

## 2012-04-01 MED ORDER — CEFAZOLIN SODIUM-DEXTROSE 2-3 GM-% IV SOLR
2.0000 g | INTRAVENOUS | Status: AC
  Administered 2012-04-01: 2 g via INTRAVENOUS

## 2012-04-01 MED ORDER — RIVAROXABAN 10 MG PO TABS
10.0000 mg | ORAL_TABLET | Freq: Every day | ORAL | Status: DC
  Administered 2012-04-02 – 2012-04-04 (×3): 10 mg via ORAL
  Filled 2012-04-01 (×6): qty 1

## 2012-04-01 MED ORDER — BUPIVACAINE ON-Q PAIN PUMP (FOR ORDER SET NO CHG)
INJECTION | Status: DC
  Filled 2012-04-01: qty 1

## 2012-04-01 MED ORDER — FENTANYL CITRATE 0.05 MG/ML IJ SOLN
INTRAMUSCULAR | Status: DC | PRN
  Administered 2012-04-01: 50 ug via INTRAVENOUS

## 2012-04-01 MED ORDER — LOSARTAN POTASSIUM-HCTZ 100-25 MG PO TABS: 1.0000 | ORAL_TABLET | Freq: Every morning | ORAL | Status: DC

## 2012-04-01 MED ORDER — TAMSULOSIN HCL 0.4 MG PO CAPS
0.4000 mg | ORAL_CAPSULE | Freq: Every day | ORAL | Status: DC
  Administered 2012-04-01 – 2012-04-03 (×3): 0.4 mg via ORAL
  Filled 2012-04-01 (×4): qty 1

## 2012-04-01 MED ORDER — METOCLOPRAMIDE HCL 5 MG/ML IJ SOLN: 5.0000 mg | Freq: Three times a day (TID) | INTRAMUSCULAR | Status: DC | PRN

## 2012-04-01 MED ORDER — OXYCODONE HCL 5 MG PO TABS
5.0000 mg | ORAL_TABLET | ORAL | Status: DC | PRN
  Administered 2012-04-01 – 2012-04-02 (×2): 10 mg via ORAL
  Administered 2012-04-02: 15 mg via ORAL
  Administered 2012-04-02 – 2012-04-04 (×4): 10 mg via ORAL
  Filled 2012-04-01: qty 4
  Filled 2012-04-01: qty 2
  Filled 2012-04-01: qty 1
  Filled 2012-04-01: qty 2
  Filled 2012-04-01: qty 4
  Filled 2012-04-01 (×2): qty 2
  Filled 2012-04-01 (×2): qty 1
  Filled 2012-04-01: qty 2

## 2012-04-01 MED ORDER — BUPIVACAINE HCL (PF) 0.75 % IJ SOLN
INTRAMUSCULAR | Status: DC | PRN
  Administered 2012-04-01: 15 mg via INTRATHECAL

## 2012-04-01 MED ORDER — ACETAMINOPHEN 10 MG/ML IV SOLN
1000.0000 mg | Freq: Four times a day (QID) | INTRAVENOUS | Status: AC
  Administered 2012-04-01 – 2012-04-02 (×4): 1000 mg via INTRAVENOUS
  Filled 2012-04-01 (×6): qty 100

## 2012-04-01 MED ORDER — ONDANSETRON HCL 4 MG/2ML IJ SOLN: 4.0000 mg | Freq: Four times a day (QID) | INTRAMUSCULAR | Status: DC | PRN

## 2012-04-01 MED ORDER — DEXAMETHASONE SODIUM PHOSPHATE 10 MG/ML IJ SOLN
10.0000 mg | Freq: Once | INTRAMUSCULAR | Status: AC
  Filled 2012-04-01: qty 1

## 2012-04-01 MED ORDER — HYDROCHLOROTHIAZIDE 25 MG PO TABS
25.0000 mg | ORAL_TABLET | Freq: Every day | ORAL | Status: DC
  Administered 2012-04-01 – 2012-04-04 (×4): 25 mg via ORAL
  Filled 2012-04-01 (×4): qty 1

## 2012-04-01 MED ORDER — MORPHINE SULFATE 2 MG/ML IJ SOLN
1.0000 mg | INTRAMUSCULAR | Status: DC | PRN
  Administered 2012-04-01: 2 mg via INTRAVENOUS
  Filled 2012-04-01: qty 1

## 2012-04-01 MED ORDER — ACETAMINOPHEN 10 MG/ML IV SOLN
1000.0000 mg | Freq: Once | INTRAVENOUS | Status: AC
  Administered 2012-04-01: 1000 mg via INTRAVENOUS

## 2012-04-01 MED ORDER — DOCUSATE SODIUM 100 MG PO CAPS
100.0000 mg | ORAL_CAPSULE | Freq: Two times a day (BID) | ORAL | Status: DC
  Administered 2012-04-01 – 2012-04-04 (×6): 100 mg via ORAL

## 2012-04-01 MED ORDER — PROPOFOL INFUSION 10 MG/ML OPTIME
INTRAVENOUS | Status: DC | PRN
  Administered 2012-04-01: 75 ug/kg/min via INTRAVENOUS

## 2012-04-01 MED ORDER — LOSARTAN POTASSIUM 50 MG PO TABS
100.0000 mg | ORAL_TABLET | Freq: Every day | ORAL | Status: DC
  Administered 2012-04-01 – 2012-04-04 (×4): 100 mg via ORAL
  Filled 2012-04-01 (×4): qty 2

## 2012-04-01 MED ORDER — MENTHOL 3 MG MT LOZG: 1.0000 | LOZENGE | OROMUCOSAL | Status: DC | PRN

## 2012-04-01 MED ORDER — ACETAMINOPHEN 325 MG PO TABS: 650.0000 mg | ORAL_TABLET | Freq: Four times a day (QID) | ORAL | Status: DC | PRN

## 2012-04-01 MED ORDER — SODIUM CHLORIDE 0.9 % IV SOLN: INTRAVENOUS | Status: DC

## 2012-04-01 MED ORDER — 0.9 % SODIUM CHLORIDE (POUR BTL) OPTIME
TOPICAL | Status: DC | PRN
  Administered 2012-04-01: 1000 mL

## 2012-04-01 MED ORDER — POLYETHYLENE GLYCOL 3350 17 G PO PACK: 17.0000 g | PACK | Freq: Every day | ORAL | Status: DC | PRN

## 2012-04-01 MED ORDER — FINASTERIDE 5 MG PO TABS
5.0000 mg | ORAL_TABLET | Freq: Every day | ORAL | Status: DC
  Administered 2012-04-01 – 2012-04-04 (×4): 5 mg via ORAL
  Filled 2012-04-01 (×4): qty 1

## 2012-04-01 MED ORDER — ONDANSETRON HCL 4 MG/2ML IJ SOLN
INTRAMUSCULAR | Status: DC | PRN
  Administered 2012-04-01: 4 mg via INTRAVENOUS

## 2012-04-01 MED ORDER — PHENOL 1.4 % MT LIQD: 1.0000 | OROMUCOSAL | Status: DC | PRN

## 2012-04-01 MED ORDER — FLEET ENEMA 7-19 GM/118ML RE ENEM: 1.0000 | ENEMA | Freq: Once | RECTAL | Status: AC | PRN

## 2012-04-01 MED ORDER — TRAMADOL HCL 50 MG PO TABS: 50.0000 mg | ORAL_TABLET | Freq: Four times a day (QID) | ORAL | Status: DC | PRN

## 2012-04-01 SURGICAL SUPPLY — 53 items
BAG ZIPLOCK 12X15 (MISCELLANEOUS) ×2 IMPLANT
BANDAGE ELASTIC 6 VELCRO ST LF (GAUZE/BANDAGES/DRESSINGS) ×2 IMPLANT
BANDAGE ESMARK 6X9 LF (GAUZE/BANDAGES/DRESSINGS) ×1 IMPLANT
BLADE SAG 18X100X1.27 (BLADE) ×2 IMPLANT
BLADE SAW SGTL 11.0X1.19X90.0M (BLADE) ×2 IMPLANT
BNDG CMPR 9X6 STRL LF SNTH (GAUZE/BANDAGES/DRESSINGS) ×1
BNDG ESMARK 6X9 LF (GAUZE/BANDAGES/DRESSINGS) ×2
BOWL SMART MIX CTS (DISPOSABLE) ×2 IMPLANT
CATH KIT ON-Q SILVERSOAK 5IN (CATHETERS) ×2 IMPLANT
CEMENT HV SMART SET (Cement) ×2 IMPLANT
CLOTH BEACON ORANGE TIMEOUT ST (SAFETY) ×2 IMPLANT
CLSR STERI-STRIP ANTIMIC 1/2X4 (GAUZE/BANDAGES/DRESSINGS) ×2 IMPLANT
CUFF TOURN SGL QUICK 34 (TOURNIQUET CUFF) ×2
CUFF TRNQT CYL 34X4X40X1 (TOURNIQUET CUFF) ×1 IMPLANT
DRAPE EXTREMITY T 121X128X90 (DRAPE) ×2 IMPLANT
DRAPE POUCH INSTRU U-SHP 10X18 (DRAPES) ×2 IMPLANT
DRAPE U-SHAPE 47X51 STRL (DRAPES) ×2 IMPLANT
DRSG ADAPTIC 3X8 NADH LF (GAUZE/BANDAGES/DRESSINGS) ×2 IMPLANT
DRSG PAD ABDOMINAL 8X10 ST (GAUZE/BANDAGES/DRESSINGS) ×2 IMPLANT
DURAPREP 26ML APPLICATOR (WOUND CARE) ×2 IMPLANT
ELECT REM PT RETURN 9FT ADLT (ELECTROSURGICAL) ×2
ELECTRODE REM PT RTRN 9FT ADLT (ELECTROSURGICAL) ×1 IMPLANT
EVACUATOR 1/8 PVC DRAIN (DRAIN) ×2 IMPLANT
FACESHIELD LNG OPTICON STERILE (SAFETY) ×10 IMPLANT
GLOVE BIO SURGEON STRL SZ8 (GLOVE) ×2 IMPLANT
GLOVE BIOGEL PI IND STRL 8 (GLOVE) ×2 IMPLANT
GLOVE BIOGEL PI INDICATOR 8 (GLOVE) ×2
GLOVE ECLIPSE 8.0 STRL XLNG CF (GLOVE) ×2 IMPLANT
GLOVE SURG SS PI 6.5 STRL IVOR (GLOVE) ×4 IMPLANT
GOWN STRL NON-REIN LRG LVL3 (GOWN DISPOSABLE) ×4 IMPLANT
GOWN STRL REIN XL XLG (GOWN DISPOSABLE) ×2 IMPLANT
HANDPIECE INTERPULSE COAX TIP (DISPOSABLE) ×2
IMMOBILIZER KNEE 20 (SOFTGOODS) ×2
IMMOBILIZER KNEE 20 THIGH 36 (SOFTGOODS) ×1 IMPLANT
KIT BASIN OR (CUSTOM PROCEDURE TRAY) ×2 IMPLANT
MANIFOLD NEPTUNE II (INSTRUMENTS) ×2 IMPLANT
NS IRRIG 1000ML POUR BTL (IV SOLUTION) ×2 IMPLANT
PACK TOTAL JOINT (CUSTOM PROCEDURE TRAY) ×2 IMPLANT
PAD ABD 7.5X8 STRL (GAUZE/BANDAGES/DRESSINGS) ×2 IMPLANT
PADDING CAST COTTON 6X4 STRL (CAST SUPPLIES) ×2 IMPLANT
POSITIONER SURGICAL ARM (MISCELLANEOUS) ×2 IMPLANT
SET HNDPC FAN SPRY TIP SCT (DISPOSABLE) ×1 IMPLANT
SPONGE GAUZE 4X4 12PLY (GAUZE/BANDAGES/DRESSINGS) ×2 IMPLANT
STRIP CLOSURE SKIN 1/2X4 (GAUZE/BANDAGES/DRESSINGS) ×4 IMPLANT
SUCTION FRAZIER 12FR DISP (SUCTIONS) ×2 IMPLANT
SUT MNCRL AB 4-0 PS2 18 (SUTURE) ×2 IMPLANT
SUT VIC AB 2-0 CT1 27 (SUTURE) ×6
SUT VIC AB 2-0 CT1 TAPERPNT 27 (SUTURE) ×3 IMPLANT
SUT VLOC 180 0 24IN GS25 (SUTURE) ×2 IMPLANT
TOWEL OR 17X26 10 PK STRL BLUE (TOWEL DISPOSABLE) ×4 IMPLANT
TRAY FOLEY CATH 14FRSI W/METER (CATHETERS) ×2 IMPLANT
WATER STERILE IRR 1500ML POUR (IV SOLUTION) ×2 IMPLANT
WRAP KNEE MAXI GEL POST OP (GAUZE/BANDAGES/DRESSINGS) ×4 IMPLANT

## 2012-04-01 NOTE — Interval H&P Note (Signed)
History and Physical Interval Note:  04/01/2012 9:42 AM  Kyle Lewis  has presented today for surgery, with the diagnosis of osteoarthritis right knee  The various methods of treatment have been discussed with the patient and family. After consideration of risks, benefits and other options for treatment, the patient has consented to  Procedure(s) (LRB) with comments: TOTAL KNEE ARTHROPLASTY (Right) as a surgical intervention .  The patient's history has been reviewed, patient examined, no change in status, stable for surgery.  I have reviewed the patient's chart and labs.  Questions were answered to the patient's satisfaction.     Loanne Drilling

## 2012-04-01 NOTE — Anesthesia Postprocedure Evaluation (Signed)
  Anesthesia Post-op Note  Patient: Kyle Lewis  Procedure(s) Performed: Procedure(s) (LRB): TOTAL KNEE ARTHROPLASTY (Left)  Patient Location: PACU  Anesthesia Type: Spinal  Level of Consciousness: awake and alert   Airway and Oxygen Therapy: Patient Spontanous Breathing  Post-op Pain: mild  Post-op Assessment: Post-op Vital signs reviewed, Patient's Cardiovascular Status Stable, Respiratory Function Stable, Patent Airway and No signs of Nausea or vomiting  Post-op Vital Signs: stable  Complications: No apparent anesthesia complications

## 2012-04-01 NOTE — Plan of Care (Signed)
Problem: Consults Goal: Diagnosis- Total Joint Replacement Total knee replacement         

## 2012-04-01 NOTE — Anesthesia Procedure Notes (Signed)
Spinal  Patient location during procedure: OR Start time: 04/01/2012 11:10 AM End time: 04/01/2012 11:15 AM Staffing Anesthesiologist: Ronelle Nigh L Performed by: anesthesiologist  Preanesthetic Checklist Completed: patient identified, site marked, surgical consent, pre-op evaluation, timeout performed, IV checked, risks and benefits discussed and monitors and equipment checked Spinal Block Patient position: sitting Prep: Betadine Patient monitoring: heart rate, continuous pulse ox and blood pressure Approach: midline Location: L3-4 Injection technique: single-shot Needle Needle type: Spinocan  Needle gauge: 22 G Needle length: 9 cm Assessment Sensory level: T6 Additional Notes Expiration date of kit checked and confirmed. Patient tolerated procedure well, without complications.

## 2012-04-01 NOTE — Op Note (Signed)
Pre-operative diagnosis- Osteoarthritis  Left knee(s)  Post-operative diagnosis- Osteoarthritis Left knee(s)  Procedure-  Left  Total Knee Arthroplasty  Surgeon- Gus Rankin. Kelso Bibby, MD  Assistant- Dimitri Ped, PA-C   Anesthesia-  Spinal EBL-* No blood loss amount entered *  Drains Hemovac  Tourniquet time- 36 minutes @ 300 mmHg Complications- None  Condition-PACU - hemodynamically stable.   Brief Clinical Note   Choice Kleinsasser is a 66 y.o. year old male with end stage OA of his left knee with progressively worsening pain and dysfunction. He has constant pain, with activity and at rest and significant functional deficits with difficulties even with ADLs. He has had extensive non-op management including analgesics, injections of cortisone and viscosupplements, and home exercise program, but remains in significant pain with significant dysfunction. Radiographs show bone on bone arthritis medial and patellofemoral with large varus deformity. He presents now for left Total Knee Arthroplasty.     Procedure in detail---   The patient is brought into the operating room and positioned supine on the operating table. After successful administration of  Spinal,   a tourniquet is placed high on the  Left thigh(s) and the lower extremity is prepped and draped in the usual sterile fashion. Time out is performed by the operating team and then the  Left lower extremity is wrapped in Esmarch, knee flexed and the tourniquet inflated to 300 mmHg.       A midline incision is made with a ten blade through the subcutaneous tissue to the level of the extensor mechanism. A fresh blade is used to make a medial parapatellar arthrotomy. Soft tissue over the proximal medial tibia is subperiosteally elevated to the joint line with a knife and into the semimembranosus bursa with a Cobb elevator. Soft tissue over the proximal lateral tibia is elevated with attention being paid to avoiding the patellar tendon on the tibial  tubercle. The patella is everted, knee flexed 90 degrees and the ACL and PCL are removed. Findings are bone on bone medial and patellofemoral with lare medial tibial defect.        The drill is used to create a starting hole in the distal femur and the canal is thoroughly irrigated with sterile saline to remove the fatty contents. The 5 degree Left  valgus alignment guide is placed into the femoral canal and the distal femoral cutting block is pinned to remove 10 mm off the distal femur. Resection is made with an oscillating saw.      The tibia is subluxed forward and the menisci are removed. The extramedullary alignment guide is placed referencing proximally at the medial aspect of the tibial tubercle and distally along the second metatarsal axis and tibial crest. The block is pinned to remove 2mm off the more deficient medial  side. The resection is larger than usual due to the depth of his tibial defect.Resection is made with an oscillating saw. Size 4is the most appropriate size for the tibia and the proximal tibia is prepared with the modular drill and keel punch for that size.      The femoral sizing guide is placed and size 4 is most appropriate. Rotation is marked off the epicondylar axis and confirmed by creating a rectangular flexion gap at 90 degrees. The size 4 cutting block is pinned in this rotation and the anterior, posterior and chamfer cuts are made with the oscillating saw. The intercondylar block is then placed and that cut is made.      Trial size 4  tibial component, trial size 4 posterior stabilized femur and a 17.5  mm posterior stabilized rotating platform insert trial is placed. Full extension is achieved with excellent varus/valgus and anterior/posterior balance throughout full range of motion. The patella is everted and thickness measured to be 24  mm. Free hand resection is taken to 14 mm, a 38 template is placed, lug holes are drilled, trial patella is placed, and it tracks normally.  Osteophytes are removed off the posterior femur with the trial in place. All trials are removed and the cut bone surfaces prepared with pulsatile lavage. Cement is mixed and once ready for implantation, the size 4 tibial implant, size  4 posterior stabilized femoral component, and the size 38 patella are cemented in place and the patella is held with the clamp. The trial insert is placed and the knee held in full extension. All extruded cement is removed and once the cement is hard the permanent 17.5 mm posterior stabilized rotating platform insert is placed into the tibial tray.      The wound is copiously irrigated with saline solution and the extensor mechanism closed over a hemovac drain with #1 PDS suture. The tourniquet is released for a total tourniquet time of 36  minutes. Flexion against gravity is 140 degrees and the patella tracks normally. Subcutaneous tissue is closed with 2.0 vicryl and subcuticular with running 4.0 Monocryl. The catheter for the Marcaine pain pump is placed and the pump is initiated. The incision is cleaned and dried and steri-strips and a bulky sterile dressing are applied. The limb is placed into a knee immobilizer and the patient is awakened and transported to recovery in stable condition.      Please note that a surgical assistant was a medical necessity for this procedure in order to perform it in a safe and expeditious manner. Surgical assistant was necessary to retract the ligaments and vital neurovascular structures to prevent injury to them and also necessary for proper positioning of the limb to allow for anatomic placement of the prosthesis.   Gus Rankin Virginio Isidore, MD    04/01/2012, 12:04 PM

## 2012-04-01 NOTE — Transfer of Care (Signed)
Immediate Anesthesia Transfer of Care Note  Patient: Kyle Lewis  Procedure(s) Performed: Procedure(s) (LRB) with comments: TOTAL KNEE ARTHROPLASTY (Left)  Patient Location: PACU  Anesthesia Type:Spinal  Level of Consciousness: awake, alert , oriented and patient cooperative  Airway & Oxygen Therapy: Patient Spontanous Breathing and Patient connected to face mask oxygen  Post-op Assessment: Report given to PACU RN and Post -op Vital signs reviewed and stable  Post vital signs: Reviewed and stable  Complications: No apparent anesthesia complications

## 2012-04-01 NOTE — H&P (View-Only) (Signed)
Kyle Lewis  DOB: July 14, 1945 Married / Language: English / Race: White Male  Date of Admission:  04/01/2012  Chief Complaint:  Left Knee Pain  History of Present Illness The patient is a 66 year old male who comes in for a preoperative History and Physical. The patient is scheduled for a left total knee arthroplasty to be performed by Dr. Gus Rankin. Aluisio, MD at [facility]. The patient is a 66 year old male who presents with knee complaints. The patient reports left knee symptoms including: pain, instability, giving way and weakness which began year(s) ago without any known injury. The patient describes the severity of the symptoms as severe.The patient feels that the symptoms are worsening. The patient has the current diagnosis of knee osteoarthritis. Prior to being seen today the patient was previously evaluated in this clinic (Dr. Simonne Come). Previous work-up for this problem has included knee x-rays. Past treatment for this problem has included intra-articular injection of corticosteroids (as well as Euflexxa. The Euflexxa didn't really help at all). The patient was last seen in clinic 7 month(s) ago (had cortisone injection then). Current treatment includes knee brace and opioid analgesics (taking Oxycodone 10mg , mostly for his back pain). He complains of pain at all times. He states that he's been unable to walk recently because of increased pain. Recently, he had a marked increase in acute onset of low back pain. This made it almost impossible for him to get around. Dr. Ethelene Hal did an injection and put him on analgesics, and the back is better, but the knee is what is bothering him the most. He states that he needs his knee replaced and wants me to do it immediately.  He is definitely ready to proceed with surgery. They have been treated conservatively in the past for the above stated problem and despite conservative measures, they continue to have progressive pain  and severe functional limitations and dysfunction. They have failed non-operative management including home exercise, medications, and injections. It is felt that they would benefit from undergoing total joint replacement. Risks and benefits of the procedure have been discussed with the patient and they elect to proceed with surgery. There are no active contraindications to surgery such as ongoing infection or rapidly progressive neurological disease.  Problem List Osteoarthrosis NOS, lower leg (715.96). 10/05/2010   Allergies No Known Drug Allergies   Family History Heart Disease. mother, father and grandfather fathers side, mother and father Diabetes Mellitus. father Hypertension. mother Osteoarthritis. mother Cancer. grandfather mothers side Congestive Heart Failure. mother Chronic Obstructive Lung Disease. mother   Social History Children. 2 Alcohol use. former drinker current drinker; only occasionally per week Drug/Alcohol Rehab (Currently). no Current work status. working full time Drug/Alcohol Rehab (Previously). no Tobacco use. current every day smoker; smoke(d) 3 or more pack(s) per day smoke(d) 1 pack(s) per day Tobacco / smoke exposure. yes no Living situation. live with spouse Illicit drug use. no Pain Contract. no Marital status. married Previously in rehab. no Number of flights of stairs before winded. less than 1 Most recent primary occupation. truck driver Exercise. Exercises never   Medication History Tamsulosin HCl (0.4MG  Capsule, Oral) Active. Losartan Potassium-HCTZ (100-25MG  Tablet, Oral) Active. Finasteride (5MG  Tablet, Oral) Active. Meloxicam ( Oral) Specific dose unknown - Active. OxyCODONE HCl (10MG  Tablet, 1 (one) Tablet Oral four times daily, as needed, Taken starting 02/13/2012) Active. Omega-3 Fatty Acids (300MG  Capsule, Oral) Active. Aspirin EC (81MG  Tablet DR, Oral) Active.   Past Surgical  History Tonsillectomy Prostatectomy; Transurethral  Inguinal Hernia Repair. open: bilateral Carpal Tunnel Repair. right left Appendectomy Colon Polyp Removal - Colonoscopy Cataract Surgery. bilateral   Medical History High blood pressure Chronic Pain Osteoarthritis Anxiety Disorder Sleep Apnea. uses CPAP Hemorrhoids Scarlet Fever. Childhood Measles Mumps Diverticulosis   Review of Systems General:Not Present- Chills, Fever, Night Sweats, Fatigue, Weight Gain, Weight Loss and Memory Loss. Skin:Not Present- Hives, Itching, Rash, Eczema and Lesions. HEENT:Not Present- Tinnitus, Headache, Double Vision, Visual Loss, Hearing Loss and Dentures. Respiratory:Not Present- Shortness of breath with exertion, Shortness of breath at rest, Allergies, Coughing up blood and Chronic Cough. Cardiovascular:Not Present- Chest Pain, Racing/skipping heartbeats, Difficulty Breathing Lying Down, Murmur, Swelling and Palpitations. Gastrointestinal:Not Present- Bloody Stool, Heartburn, Abdominal Pain, Vomiting, Nausea, Constipation, Diarrhea, Difficulty Swallowing, Jaundice and Loss of appetitie. Male Genitourinary:Present- Urinary frequency and Urinating at Night. Not Present- Blood in Urine, Weak urinary stream, Discharge, Flank Pain, Incontinence, Painful Urination, Urgency and Urinary Retention. Musculoskeletal:Present- Muscle Weakness, Joint Pain and Back Pain. Not Present- Muscle Pain, Joint Swelling, Morning Stiffness and Spasms. Neurological:Not Present- Tremor, Dizziness, Blackout spells, Paralysis, Difficulty with balance and Weakness. Psychiatric:Not Present- Insomnia.   Vitals Weight: 260 lb Height: 67 in Body Surface Area: 2.36 m Body Mass Index: 40.72 kg/m Pulse: 64 (Regular) Resp.: 12 (Unlabored) BP: 138/58 (Sitting, Right Arm, Standard)    Physical Exam The physical exam findings are as follows:  Patient is a 66 year old male with continued knee  pain. Patient is accompanied today by with wife.   General Mental Status - Alert, cooperative and good historian. General Appearance- pleasant. Not in acute distress. Orientation- Oriented X3. Build & Nutrition- Well nourished and Well developed.   Head and Neck Head- normocephalic, atraumatic . Neck Global Assessment- bruit auscultated on the right, bruit auscultated on the left and supple.   Eye Pupil- Bilateral- Regular and Round. Motion- Bilateral- EOMI.   Chest and Lung Exam Auscultation: Breath sounds:- clear at anterior chest wall and - clear at posterior chest wall. Adventitious sounds:- No Adventitious sounds.   Cardiovascular Auscultation:Rhythm- Regular rate and rhythm. Heart Sounds- S1 WNL and S2 WNL. Murmurs & Other Heart Sounds: Murmur 1:Location- Aortic Area. Timing- Early systolic. Grade- II/VI. Character- Low pitched.   Abdomen Inspection:Contour- Generalized moderate distention. Palpation/Percussion:Tenderness- Abdomen is non-tender to palpation. Rigidity (guarding)- Abdomen is soft. Auscultation:Auscultation of the abdomen reveals - Bowel sounds normal.   Male Genitourinary Not done, not pertinent to present illness  Musculoskeletal On exam, well-developed male, alert and oriented, in no apparent distress. Evaluation of his hips shows normal ROM and no discomfort. Right knee exam shows range 0-135, no swelling, tenderness or instability, only slight crepitus on ROM. Left knee shows a significant varus deformity, range 5-115. Marked crepitus on ROM. Tender medial greater than lateral. No true laxity, just pseudolaxity due to the deformity. Pulses, sensation and motor are intact distally.  RADIOGRAPHS: AP of both knees and a lateral show severe advanced end stage arthritis of the left knee with significant tibial subluxation, bone on bone, all 3 compartments, but worse medially.  Assessment & Plan Osteoarthrosis  NOS, lower leg (715.96) Impression: Left Knee  Note: Patient is for a Left Total Knee Replacement by Dr. Lequita Halt.  Plan is to go to [facility].  Avel Peace, PA-C

## 2012-04-01 NOTE — Anesthesia Preprocedure Evaluation (Addendum)
Anesthesia Evaluation  Patient identified by MRN, date of birth, ID band Patient awake    Reviewed: Allergy & Precautions, H&P , NPO status , Patient's Chart, lab work & pertinent test results  Airway Mallampati: III TM Distance: >3 FB Neck ROM: full    Dental  (+) Missing Missing side and back teeth:   Pulmonary sleep apnea and Continuous Positive Airway Pressure Ventilation , Current Smoker,  breath sounds clear to auscultation  Pulmonary exam normal       Cardiovascular Exercise Tolerance: Good hypertension, Pt. on medications negative cardio ROS  Rhythm:regular Rate:Normal     Neuro/Psych negative neurological ROS  negative psych ROS   GI/Hepatic negative GI ROS, Neg liver ROS,   Endo/Other  negative endocrine ROSMorbid obesityAdrenal mass  Renal/GU negative Renal ROS  negative genitourinary   Musculoskeletal   Abdominal   Peds  Hematology negative hematology ROS (+)   Anesthesia Other Findings   Reproductive/Obstetrics negative OB ROS                          Anesthesia Physical Anesthesia Plan  ASA: III  Anesthesia Plan: Spinal   Post-op Pain Management:    Induction:   Airway Management Planned:   Additional Equipment:   Intra-op Plan:   Post-operative Plan:   Informed Consent: I have reviewed the patients History and Physical, chart, labs and discussed the procedure including the risks, benefits and alternatives for the proposed anesthesia with the patient or authorized representative who has indicated his/her understanding and acceptance.   Dental Advisory Given  Plan Discussed with: CRNA and Surgeon  Anesthesia Plan Comments:         Anesthesia Quick Evaluation

## 2012-04-02 ENCOUNTER — Encounter (HOSPITAL_COMMUNITY): Payer: Self-pay | Admitting: Orthopedic Surgery

## 2012-04-02 LAB — CBC
HCT: 37.6 % — ABNORMAL LOW (ref 39.0–52.0)
Hemoglobin: 13.7 g/dL (ref 13.0–17.0)
MCH: 31.9 pg (ref 26.0–34.0)
MCHC: 36.4 g/dL — ABNORMAL HIGH (ref 30.0–36.0)
MCV: 87.6 fL (ref 78.0–100.0)
Platelets: 240 10*3/uL (ref 150–400)
RBC: 4.29 MIL/uL (ref 4.22–5.81)
RDW: 12.8 % (ref 11.5–15.5)
WBC: 10.7 10*3/uL — ABNORMAL HIGH (ref 4.0–10.5)

## 2012-04-02 LAB — BASIC METABOLIC PANEL
BUN: 9 mg/dL (ref 6–23)
CO2: 29 mEq/L (ref 19–32)
Calcium: 8.8 mg/dL (ref 8.4–10.5)
Chloride: 98 mEq/L (ref 96–112)
Creatinine, Ser: 0.49 mg/dL — ABNORMAL LOW (ref 0.50–1.35)
GFR calc Af Amer: >90 mL/min (ref >90)
GFR calc non Af Amer: >90 mL/min (ref >90)
Glucose, Bld: 126 mg/dL — ABNORMAL HIGH (ref 70–99)
Potassium: 3.4 mEq/L — ABNORMAL LOW (ref 3.5–5.1)
Sodium: 136 mEq/L (ref 135–145)

## 2012-04-02 MED ORDER — POTASSIUM CHLORIDE CRYS ER 20 MEQ PO TBCR
40.0000 meq | EXTENDED_RELEASE_TABLET | Freq: Two times a day (BID) | ORAL | Status: AC
  Administered 2012-04-02 (×2): 40 meq via ORAL
  Filled 2012-04-02 (×3): qty 2

## 2012-04-02 NOTE — Progress Notes (Signed)
Clinical Social Work Department CLINICAL SOCIAL WORK PLACEMENT NOTE 04/02/2012  Patient:  Kyle Lewis, Kyle Lewis  Account Number:  [id number] Admit date:  04/01/2012  Clinical Social Worker:  Cori Razor, LCSW  Date/time:  04/02/2012 03:00 PM  Clinical Social Work is seeking post-discharge placement for this patient at the following level of care:   SKILLED NURSING   (*CSW will update this form in Epic as items are completed)     Patient/family provided with [facility] Department of Clinical Social Work's list of facilities offering this level of care within the geographic area requested by the patient (or if unable, by the patient's family).    Patient/family informed of their freedom to choose among providers that offer the needed level of care, that participate in Medicare, Medicaid or managed care program needed by the patient, have an available bed and are willing to accept the patient.    Patient/family informed of [facility]' ownership interest in [facility], as well as of the fact that they are under no obligation to receive care at this facility.  PASARR submitted to EDS on 04/02/2012 PASARR number received from EDS on   FL2 transmitted to all facilities in geographic area requested by pt/family on  04/02/2012 FL2 transmitted to all facilities within larger geographic area on   Patient informed that his/her managed care company has contracts with or will negotiate with  certain facilities, including the following:     Patient/family informed of bed offers received:  04/02/2012 Patient chooses bed at [facility] Physician recommends and patient chooses bed at    Patient to be transferred to  on   Patient to be transferred to facility by   The following physician request were entered in Epic:   Additional Comments:  Cori Razor LCSW [phone]

## 2012-04-02 NOTE — Evaluation (Signed)
Physical Therapy Evaluation Patient Details Name: Kanard Fors MRN: [id number] DOB: [birth year] Today's Date: 04/02/2012 Time: 7829-5621 PT Time Calculation (min): 46 min  PT Assessment / Plan / Recommendation Clinical Impression  Pt s/p L TKR presents with decreased L LE strength/ROM  and preexisting back problems limiting functional mobility    PT Assessment  Patient needs continued PT services    Follow Up Recommendations  Post acute inpatient    Does the patient have the potential to tolerate intense rehabilitation   No, Recommend SNF  Barriers to Discharge None      Equipment Recommendations  None recommended by PT    Recommendations for Other Services OT consult   Frequency 7X/week    Precautions / Restrictions Precautions Precautions: Knee Restrictions Weight Bearing Restrictions: No Other Position/Activity Restrictions: WBAT   Pertinent Vitals/Pain 4/10      Mobility  Bed Mobility Bed Mobility: Supine to Sit Supine to Sit: 4: Min assist;3: Mod assist Details for Bed Mobility Assistance: cues for sequence with assist for L LE Transfers Transfers: Sit to Stand;Stand to Sit Sit to Stand: 4: Min assist;3: Mod assist Stand to Sit: 4: Min assist;3: Mod assist Details for Transfer Assistance: cues for LE management and use of UEs to self assist Ambulation/Gait Ambulation/Gait Assistance: 4: Min assist;3: Mod assist Ambulation Distance (Feet): 61 Feet Assistive device: Rolling walker Ambulation/Gait Assistance Details: cues for posture, sequence and position from RW Gait Pattern: Step-to pattern Stairs: No    Shoulder Instructions     Exercises Total Joint Exercises Ankle Circles/Pumps: AROM;15 reps;Supine;Both Quad Sets: AROM;10 reps;Supine;Both Heel Slides: AAROM;15 reps;Supine;Left Straight Leg Raises: AAROM;10 reps;Left;Supine   PT Diagnosis: Difficulty walking  PT Problem List: Decreased strength;Decreased range of motion;Decreased activity  tolerance;Decreased mobility;Decreased knowledge of use of DME;Obesity;Pain PT Treatment Interventions: DME instruction;Gait training;Stair training;Functional mobility training;Therapeutic activities;Therapeutic exercise;Patient/family education   PT Goals Acute Rehab PT Goals PT Goal Formulation: With patient Time For Goal Achievement: 04/06/12 Potential to Achieve Goals: Good Pt will go Supine/Side to Sit: with supervision PT Goal: Supine/Side to Sit - Progress: Goal set today Pt will go Sit to Supine/Side: with supervision PT Goal: Sit to Supine/Side - Progress: Goal set today Pt will go Sit to Stand: with supervision PT Goal: Sit to Stand - Progress: Goal set today Pt will go Stand to Sit: with supervision PT Goal: Stand to Sit - Progress: Goal set today Pt will Ambulate: 51 - 150 feet;with supervision;with rolling walker PT Goal: Ambulate - Progress: Goal set today  Visit Information  Last PT Received On: 04/02/12 Assistance Needed: +1    Subjective Data  Subjective: I'm more worried about my back hurting than my knee Patient Stated Goal: Rehab my knee without aggravating my back   Prior Functioning  Home Living Lives With: Spouse Prior Function Level of Independence: Independent with assistive device(s) Able to Take Stairs?: Yes Driving: Yes Vocation: On disability Communication Communication: No difficulties    Cognition  Overall Cognitive Status: Appears within functional limits for tasks assessed/performed Arousal/Alertness: Awake/alert Orientation Level: Appears intact for tasks assessed Behavior During Session: [facility] for tasks performed    Extremity/Trunk Assessment Right Upper Extremity Assessment RUE ROM/Strength/Tone: [facility] for tasks assessed Left Upper Extremity Assessment LUE ROM/Strength/Tone: WFL for tasks assessed Right Lower Extremity Assessment RLE ROM/Strength/Tone: [facility] for tasks assessed Left Lower Extremity Assessment LLE ROM/Strength/Tone:  Deficits LLE ROM/Strength/Tone Deficits: 3-/5 quads with AAROM at knee -10 - 40   Balance    End of Session PT - End of  Session Equipment Utilized During Treatment: Left knee immobilizer Activity Tolerance: Patient tolerated treatment well Patient left: in chair;with call bell/phone within reach;with family/visitor present Nurse Communication: Mobility status CPM Left Knee CPM Left Knee: Off  GP     Zhamir Pirro 04/02/2012, 12:56 PM

## 2012-04-02 NOTE — Progress Notes (Signed)
Clinical Social Work Department BRIEF PSYCHOSOCIAL ASSESSMENT 04/02/2012  Patient:  Kyle Lewis,Kyle Lewis     Account Number:  [id number]     Admit date:  04/01/2012  Clinical Social Worker:  Candie Chroman  Date/Time:  04/02/2012 02:52 PM  Referred by:  Physician  Date Referred:  04/02/2012 Referred for  SNF Placement   Other Referral:   Interview type:  Patient Other interview type:    PSYCHOSOCIAL DATA Living Status:  WIFE Admitted from facility:   Level of care:   Primary support name:  Kyle Lewis Primary support relationship to patient:  SPOUSE Degree of support available:   supportive    CURRENT CONCERNS Current Concerns  Post-Acute Placement   Other Concerns:    SOCIAL WORK ASSESSMENT / PLAN Pt is a 66 yr old gentleman living at home prior to hospitalization. CSW met with pt/spouse to assist with d/c planning. Pt has made prior arrangements to have ST Rehab at [facility] in [place]. CSW contacted SNF and d/c plan has been confirmed. CSW will follow to assist with d/c planning to SNF.   Assessment/plan status:  Psychosocial Support/Ongoing Assessment of Needs Other assessment/ plan:   Information/referral to community resources:   None needed at this time.    PATIENT'S/FAMILY'S RESPONSE TO PLAN OF CARE: Pt is looking forward to having rehab at [facility] following hospital d/c.   Cori Razor LCSW [phone]

## 2012-04-02 NOTE — Progress Notes (Signed)
   Subjective: 1 Day Post-Op Procedure(s) (LRB): TOTAL KNEE ARTHROPLASTY (Left) Patient reports pain as mild and moderate.   Patient seen in rounds with Dr. Lequita Halt. Patient is well, and has had no acute complaints or problems We will start therapy today.  Plan is to go Skilled nursing facility after hospital stay.  Objective: Vital signs in last 24 hours: Temp:  [97.4 F (36.3 C)-99.1 F (37.3 C)] 98.3 F (36.8 C) (10/29 2100) Pulse Rate:  [72-90] 90  (10/29 2100) Resp:  [16-17] 16  (10/29 2100) BP: (139-156)/(71-86) 156/86 mmHg (10/29 2100) SpO2:  [91 %-97 %] 91 % (10/29 2100)  Intake/Output from previous day:  Intake/Output Summary (Last 24 hours) at 04/02/12 2245 Last data filed at 04/02/12 2047  Gross per 24 hour  Intake 786.67 ml  Output   3085 ml  Net -2298.33 ml    Intake/Output this shift: Total I/O In: -  Out: 350 [Urine:350]  Labs:  [facility] 04/02/12 0419  HGB 13.7    Basename 04/02/12 0419  WBC 10.7*  RBC 4.29  HCT 37.6*  PLT 240    Basename 04/02/12 0419  NA 136  K 3.4*  CL 98  CO2 29  BUN 9  CREATININE 0.49*  GLUCOSE 126*  CALCIUM 8.8   No results found for this basename: LABPT:2,INR:2 in the last 72 hours  EXAM General - Patient is Alert, Appropriate and Oriented Extremity - Neurovascular intact Sensation intact distally Dorsiflexion/Plantar flexion intact Dressing - dressing C/D/I Motor Function - intact, moving foot and toes well on exam.  Hemovac pulled without difficulty.  Past Medical History  Diagnosis Date  . Diverticulosis   . Adenomatous colon polyp   . Sleep apnea     wears CPAP  . Allergy     seasonal  . Arthritis   . Cataract   . Hypertension   . Nocturia   . BPH (benign prostatic hyperplasia)   . Adrenal mass   . Chronic back pain     Assessment/Plan: 1 Day Post-Op Procedure(s) (LRB): TOTAL KNEE ARTHROPLASTY (Left) Principal Problem:  *OA (osteoarthritis) of knee  Estimated Body mass index is 40.41  kg/(m^2) as calculated from the following:   Height as of this encounter: 5\' 7" (1.702 m).   Weight as of this encounter: 258 lb(117.028 kg). Advance diet Up with therapy Discharge to SNF  DVT Prophylaxis - Xarelto Weight-Bearing as tolerated to left leg No vaccines. D/C O2 and Pulse OX and try on Room [address]  Patrica Duel 04/02/2012, 10:45 PM

## 2012-04-02 NOTE — Care Management Note (Signed)
    Page 1 of 2   04/02/2012     5:26:46 PM   CARE MANAGEMENT NOTE 04/02/2012  Patient:  Kyle Lewis,Kyle Lewis   Account Number:  [id number]  Date Initiated:  04/02/2012  Documentation initiated by:  Colleen Can  Subjective/Objective Assessment:   dx total right knee replacemnt     Action/Plan:   Cm spoke with patient and spouse. Plans are for patient to go to SNF for rehab   Anticipated DC Date:  04/04/2012   Anticipated DC Plan:  SKILLED NURSING FACILITY  In-house referral  Clinical Social Worker      DC Planning Services  CM consult      [facility] Choice  NA   Choice offered to / List presented to:  NA   DME arranged  NA      DME agency  NA     HH arranged  NA      HH agency  NA   Status of service:  Completed, signed off Medicare Important Message given?   (If response is "NO", the following Medicare IM given date fields will be blank) Date Medicare IM given:   Date Additional Medicare IM given:    Discharge Disposition:    Per UR Regulation:    If discussed at Long Length of Stay Meetings, dates discussed:    Comments:

## 2012-04-02 NOTE — Progress Notes (Signed)
Physical Therapy Treatment Patient Details Name: Kyle Lewis MRN: [id number] DOB: 1945-07-07 Today's Date: 04/02/2012 Time: 1340-1403 PT Time Calculation (min): 23 min  PT Assessment / Plan / Recommendation Comments on Treatment Session       Follow Up Recommendations  Post acute inpatient     Does the patient have the potential to tolerate intense rehabilitation  No, Recommend SNF  Barriers to Discharge        Equipment Recommendations  3 in 1 bedside comode    Recommendations for Other Services OT consult  Frequency 7X/week   Plan Discharge plan remains appropriate    Precautions / Restrictions Precautions Precautions: Knee Restrictions Other Position/Activity Restrictions: WBAT   Pertinent Vitals/Pain 4/10    Mobility  Bed Mobility Bed Mobility: Sit to Supine Sit to Supine: 4: Min assist Details for Bed Mobility Assistance: cues for sequence with assist for L LE Transfers Transfers: Sit to Stand;Stand to Sit Sit to Stand: 4: Min assist;From chair/3-in-1;With armrests Stand to Sit: 4: Min assist;With upper extremity assist;To bed Details for Transfer Assistance: cues for LE management and use of UEs to self assist Ambulation/Gait Ambulation/Gait Assistance: 4: Min assist Ambulation Distance (Feet): 123 Feet Assistive device: Rolling walker Ambulation/Gait Assistance Details: cues for posture, sequence and position from RW Gait Pattern: Step-to pattern    Exercises     PT Diagnosis:    PT Problem List:   PT Treatment Interventions:     PT Goals Acute Rehab PT Goals PT Goal Formulation: With patient Time For Goal Achievement: 04/06/12 Potential to Achieve Goals: Good Pt will go Supine/Side to Sit: with supervision PT Goal: Supine/Side to Sit - Progress: Progressing toward goal Pt will go Sit to Supine/Side: with supervision PT Goal: Sit to Supine/Side - Progress: Progressing toward goal Pt will go Sit to Stand: with supervision PT Goal: Sit to  Stand - Progress: Progressing toward goal Pt will go Stand to Sit: with supervision PT Goal: Stand to Sit - Progress: Progressing toward goal Pt will Ambulate: 51 - 150 feet;with supervision;with rolling walker PT Goal: Ambulate - Progress: Progressing toward goal  Visit Information  Last PT Received On: 04/02/12 Assistance Needed: +1    Subjective Data  Subjective: My back is holding up better than I expected Patient Stated Goal: Rehab my knee without aggravating my back   Cognition  Overall Cognitive Status: Appears within functional limits for tasks assessed/performed Arousal/Alertness: Awake/alert Orientation Level: Appears intact for tasks assessed Behavior During Session: [facility] for tasks performed    Balance     End of Session PT - End of Session Equipment Utilized During Treatment: Left knee immobilizer Activity Tolerance: Patient tolerated treatment well Patient left: with call bell/phone within reach;in bed Nurse Communication: Mobility status   GP     Kyle Lewis 04/02/2012, 3:28 PM

## 2012-04-02 NOTE — Evaluation (Signed)
Occupational Therapy Evaluation Patient Details Name: Kyle Lewis MRN: [id number] DOB: 1946/05/09 Today's Date: 04/02/2012 Time: 4098-1191 OT Time Calculation (min): 20 min  OT Assessment / Plan / Recommendation Clinical Impression  This 66 year old man was admitted for L TKA.  He will benefit from skilled OT to increase independence with bathroom transfers with supervision level goals.     OT Assessment  Patient needs continued OT Services    Follow Up Recommendations  Skilled nursing facility    Barriers to Discharge      Equipment Recommendations  3 in 1 bedside comode    Recommendations for Other Services    Frequency  Min 2X/week    Precautions / Restrictions Precautions Precautions: Knee Restrictions Weight Bearing Restrictions: No Other Position/Activity Restrictions: WBAT   Pertinent Vitals/Pain 1/10 sitting; 2/10 standing--L knee    ADL  Grooming: Performed;teeth care;Supervision/safety Where Assessed - Grooming: Supported standing Upper Body Bathing: Simulated;Set up Where Assessed - Upper Body Bathing: Supported sitting Lower Body Bathing: Simulated;Minimal assistance Where Assessed - Lower Body Bathing: Supported sit to stand Upper Body Dressing: Simulated;Minimal assistance (lines) Where Assessed - Upper Body Dressing: Supported sitting Lower Body Dressing: Simulated;Moderate assistance Where Assessed - Lower Body Dressing: Supported sit to Pharmacist, hospital: [employer] Method: Sit to stand Toileting - Clothing Manipulation and Hygiene: Simulated;Min guard Where Assessed - Toileting Clothing Manipulation and Hygiene: Sit to stand from 3-in-1 or toilet Transfers/Ambulation Related to ADLs: ambulated a few steps to sink in room: min guard:  once cue to push up with arms from chair ADL Comments: Pt is able to bend forward for adls comfortably.  He states that twisting is what hurts his back    OT Diagnosis:  Generalized weakness  OT Problem List: Decreased strength;Decreased activity tolerance;Decreased knowledge of use of DME or AE OT Treatment Interventions: Self-care/ADL training;DME and/or AE instruction;Patient/family education   OT Goals Acute Rehab OT Goals OT Goal Formulation: With patient Time For Goal Achievement: 04/09/12 Potential to Achieve Goals: Good ADL Goals Pt Will Transfer to Toilet: with supervision;Ambulation;3-in-1 (and complete all aspects of toileting at supervision) ADL Goal: Toilet Transfer - Progress: Goal set today Pt Will Perform Tub/Shower Transfer: Shower transfer;with supervision;Ambulation ADL Goal: Tub/Shower Transfer - Progress: Goal set today  Visit Information  Last OT Received On: 04/02/12 Assistance Needed: +1    Subjective Data  Subjective: My back is doing better since I've had this surgery; I don't want to make it worse Patient Stated Goal: rehab   Prior Functioning     Home Living Lives With: Spouse Bathroom Shower/Tub: Walk-in shower Additional Comments: plans rehab Prior Function Level of Independence: Independent with assistive device(s) Able to Take Stairs?: Yes Driving: Yes Vocation: On disability Communication Communication: No difficulties Dominant Hand: Right         Vision/Perception     Cognition  Overall Cognitive Status: Appears within functional limits for tasks assessed/performed Arousal/Alertness: Awake/alert Orientation Level: Appears intact for tasks assessed Behavior During Session: [facility] for tasks performed    Extremity/Trunk Assessment Right Upper Extremity Assessment RUE ROM/Strength/Tone: [facility] for tasks assessed Left Upper Extremity Assessment LUE ROM/Strength/Tone: WFL for tasks assessed     Mobility  Transfers Sit to Stand: 4: Min assist;From chair/3-in-1;With armrests, min cues     Shoulder Instructions     Exercise    Balance     End of Session   GO      Ryelynn Guedea 04/02/2012, 1:01 PM Marica Otter, OTR/L [phone] 04/02/2012

## 2012-04-03 DIAGNOSIS — D62 Acute posthemorrhagic anemia: Secondary | ICD-10-CM | POA: Diagnosis not present

## 2012-04-03 DIAGNOSIS — E876 Hypokalemia: Secondary | ICD-10-CM | POA: Diagnosis not present

## 2012-04-03 LAB — CBC
HCT: 37.4 % — ABNORMAL LOW (ref 39.0–52.0)
Hemoglobin: 13.4 g/dL (ref 13.0–17.0)
MCH: 32.1 pg (ref 26.0–34.0)
MCHC: 35.8 g/dL (ref 30.0–36.0)
MCV: 89.5 fL (ref 78.0–100.0)
Platelets: 241 10*3/uL (ref 150–400)
RBC: 4.18 MIL/uL — ABNORMAL LOW (ref 4.22–5.81)
RDW: 13.1 % (ref 11.5–15.5)
WBC: 12.7 10*3/uL — ABNORMAL HIGH (ref 4.0–10.5)

## 2012-04-03 LAB — BASIC METABOLIC PANEL
BUN: 15 mg/dL (ref 6–23)
CO2: 31 mEq/L (ref 19–32)
Calcium: 9.2 mg/dL (ref 8.4–10.5)
Chloride: 98 mEq/L (ref 96–112)
Creatinine, Ser: 0.59 mg/dL (ref 0.50–1.35)
GFR calc Af Amer: >90 mL/min (ref >90)
GFR calc non Af Amer: >90 mL/min (ref >90)
Glucose, Bld: 132 mg/dL — ABNORMAL HIGH (ref 70–99)
Potassium: 4 mEq/L (ref 3.5–5.1)
Sodium: 136 mEq/L (ref 135–145)

## 2012-04-03 NOTE — Progress Notes (Signed)
   Subjective: 2 Days Post-Op Procedure(s) (LRB): TOTAL KNEE ARTHROPLASTY (Left) Patient reports pain as mild.   Patient seen in rounds for Dr. Lequita Halt. Sitting up in chair. Patient is well, and has had no acute complaints or problems Plan is to go Skilled nursing facility after hospital stay.  Objective: Vital signs in last 24 hours: Temp:  [98 F (36.7 C)-98.5 F (36.9 C)] 98.4 F (36.9 C) (10/30 0521) Pulse Rate:  [72-90] 80  (10/30 0521) Resp:  [16-17] 16  (10/30 0521) BP: (135-156)/(73-86) 135/73 mmHg (10/30 0521) SpO2:  [91 %-95 %] 91 % (10/30 0521)  Intake/Output from previous day:  Intake/Output Summary (Last 24 hours) at 04/03/12 0759 Last data filed at 04/02/12 2047  Gross per 24 hour  Intake      0 ml  Output   1150 ml  Net  -1150 ml    Intake/Output this shift:    Labs:  Basename 04/03/12 0435 04/02/12 0419  HGB 13.4 13.7    Basename 04/03/12 0435 04/02/12 0419  WBC 12.7* 10.7*  RBC 4.18* 4.29  HCT 37.4* 37.6*  PLT 241 240    Basename 04/03/12 0435 04/02/12 0419  NA 136 136  K 4.0 3.4*  CL 98 98  CO2 31 29  BUN 15 9  CREATININE 0.59 0.49*  GLUCOSE 132* 126*  CALCIUM 9.2 8.8   No results found for this basename: LABPT:2,INR:2 in the last 72 hours  EXAM General - Patient is Alert, Appropriate and Oriented Extremity - Neurovascular intact Sensation intact distally Dorsiflexion/Plantar flexion intact No cellulitis present Dressing/Incision - clean, dry, no drainage, healing Motor Function - intact, moving foot and toes well on exam.   Past Medical History  Diagnosis Date  . Diverticulosis   . Adenomatous colon polyp   . Sleep apnea     wears CPAP  . Allergy     seasonal  . Arthritis   . Cataract   . Hypertension   . Nocturia   . BPH (benign prostatic hyperplasia)   . Adrenal mass   . Chronic back pain     Assessment/Plan: 2 Days Post-Op Procedure(s) (LRB): TOTAL KNEE ARTHROPLASTY (Left) Principal Problem:  *OA  (osteoarthritis) of knee Active Problems:  Postop Acute blood loss anemia  Postop Hypokalemia  Estimated Body mass index is 40.41 kg/(m^2) as calculated from the following:   Height as of this encounter: 5\' 7" (1.702 m).   Weight as of this encounter: 258 lb(117.028 kg). Up with therapy Plan for discharge tomorrow Discharge to SNF  DVT Prophylaxis - Xarelto Weight-Bearing as tolerated to left leg  PERKINS, ALEXZANDREW 04/03/2012, 7:59 AM

## 2012-04-03 NOTE — Progress Notes (Signed)
Physical Therapy Treatment Patient Details Name: Kyle Lewis MRN: [id number] DOB: 06-27-1945 Today's Date: 04/03/2012 Time: 1421-1440 PT Time Calculation (min): 19 min  PT Assessment / Plan / Recommendation Comments on Treatment Session       Follow Up Recommendations  Post acute inpatient     Does the patient have the potential to tolerate intense rehabilitation  No, Recommend SNF  Barriers to Discharge        Equipment Recommendations  3 in 1 bedside comode    Recommendations for Other Services OT consult  Frequency 7X/week   Plan Discharge plan remains appropriate    Precautions / Restrictions Precautions Precautions: Knee Restrictions Weight Bearing Restrictions: No Other Position/Activity Restrictions: WBAT   Pertinent Vitals/Pain 3-4/10 with mobility    Mobility  Transfers Transfers: Sit to Stand;Stand to Sit Sit to Stand: 4: Min guard Stand to Sit: 4: Min guard Details for Transfer Assistance: cues for LE management and use of UEs to self assist Ambulation/Gait Ambulation/Gait Assistance: 4: Min guard Ambulation Distance (Feet): 127 Feet (twice) Assistive device: Rolling walker Ambulation/Gait Assistance Details: min cues for posture and position from RW Gait Pattern: Step-to pattern Stairs: No    Exercises     PT Diagnosis:    PT Problem List:   PT Treatment Interventions:     PT Goals Acute Rehab PT Goals PT Goal Formulation: With patient Time For Goal Achievement: 04/06/12 Potential to Achieve Goals: Good Pt will go Supine/Side to Sit: with supervision PT Goal: Supine/Side to Sit - Progress: Progressing toward goal Pt will go Sit to Supine/Side: with supervision PT Goal: Sit to Supine/Side - Progress: Progressing toward goal Pt will go Sit to Stand: with supervision PT Goal: Sit to Stand - Progress: Progressing toward goal Pt will go Stand to Sit: with supervision PT Goal: Stand to Sit - Progress: Progressing toward goal Pt will  Ambulate: 51 - 150 feet;with supervision;with rolling walker PT Goal: Ambulate - Progress: Progressing toward goal  Visit Information  Last PT Received On: 04/03/12 Assistance Needed: +1    Subjective Data  Subjective: I'm doing pretty good - its just stiff Patient Stated Goal: Rehab my knee without aggravating my back   Cognition  Overall Cognitive Status: Appears within functional limits for tasks assessed/performed Arousal/Alertness: Awake/alert Orientation Level: Appears intact for tasks assessed Behavior During Session: [facility] for tasks performed    Balance     End of Session PT - End of Session Activity Tolerance: Patient tolerated treatment well Patient left: with call bell/phone within reach;in chair Nurse Communication: Mobility status   GP     Jarvin Ogren 04/03/2012, 3:52 PM

## 2012-04-03 NOTE — Progress Notes (Signed)
Physical Therapy Treatment Patient Details Name: Kyle Lewis MRN: [id number] DOB: 1945-07-31 Today's Date: 04/03/2012 Time: 8295-6213 PT Time Calculation (min): 29 min  PT Assessment / Plan / Recommendation Comments on Treatment Session       Follow Up Recommendations  Post acute inpatient     Does the patient have the potential to tolerate intense rehabilitation  No, Recommend SNF  Barriers to Discharge        Equipment Recommendations  3 in 1 bedside comode    Recommendations for Other Services OT consult  Frequency 7X/week   Plan Discharge plan remains appropriate    Precautions / Restrictions Precautions Precautions: Knee Restrictions Weight Bearing Restrictions: No Other Position/Activity Restrictions: WBAT   Pertinent Vitals/Pain 3/10    Mobility  Transfers Transfers: Sit to Stand;Stand to Sit Sit to Stand: 4: Min guard Stand to Sit: 4: Min guard Details for Transfer Assistance: cues for LE management and use of UEs to self assist Ambulation/Gait Ambulation/Gait Assistance: 4: Min guard Ambulation Distance (Feet): 88 Feet (twice) Assistive device: Rolling walker Ambulation/Gait Assistance Details: cues for posture and position from RW Gait Pattern: Step-to pattern    Exercises     PT Diagnosis:    PT Problem List:   PT Treatment Interventions:     PT Goals Acute Rehab PT Goals PT Goal Formulation: With patient Time For Goal Achievement: 04/06/12 Potential to Achieve Goals: Good Pt will go Supine/Side to Sit: with supervision PT Goal: Supine/Side to Sit - Progress: Progressing toward goal Pt will go Sit to Supine/Side: with supervision PT Goal: Sit to Supine/Side - Progress: Progressing toward goal Pt will go Sit to Stand: with supervision PT Goal: Sit to Stand - Progress: Progressing toward goal Pt will go Stand to Sit: with supervision PT Goal: Stand to Sit - Progress: Progressing toward goal Pt will Ambulate: 51 - 150 feet;with  supervision;with rolling walker PT Goal: Ambulate - Progress: Progressing toward goal  Visit Information  Last PT Received On: 04/03/12 Assistance Needed: +1    Subjective Data  Subjective: My back is holding up better than I expected Patient Stated Goal: Rehab my knee without aggravating my back   Cognition  Overall Cognitive Status: Appears within functional limits for tasks assessed/performed Arousal/Alertness: Awake/alert Orientation Level: Appears intact for tasks assessed Behavior During Session: [facility] for tasks performed    Balance     End of Session PT - End of Session Equipment Utilized During Treatment: Left knee immobilizer Activity Tolerance: Patient tolerated treatment well Patient left: with call bell/phone within reach;in chair Nurse Communication: Mobility status CPM Left Knee CPM Left Knee: Off   GP     Kyle Lewis 04/03/2012, 11:13 AM

## 2012-04-04 LAB — CBC
HCT: 37.1 % — ABNORMAL LOW (ref 39.0–52.0)
Hemoglobin: 13.1 g/dL (ref 13.0–17.0)
MCH: 31.7 pg (ref 26.0–34.0)
MCHC: 35.3 g/dL (ref 30.0–36.0)
MCV: 89.8 fL (ref 78.0–100.0)
Platelets: 261 10*3/uL (ref 150–400)
RBC: 4.13 MIL/uL — ABNORMAL LOW (ref 4.22–5.81)
RDW: 13.5 % (ref 11.5–15.5)
WBC: 11.2 10*3/uL — ABNORMAL HIGH (ref 4.0–10.5)

## 2012-04-04 MED ORDER — RIVAROXABAN 10 MG PO TABS: 10.0000 mg | ORAL_TABLET | Freq: Every day | ORAL | Status: DC

## 2012-04-04 MED ORDER — BISACODYL 10 MG RE SUPP: 10.0000 mg | Freq: Every day | RECTAL | Status: DC | PRN

## 2012-04-04 MED ORDER — DSS 100 MG PO CAPS: 100.0000 mg | ORAL_CAPSULE | Freq: Two times a day (BID) | ORAL | Status: DC

## 2012-04-04 MED ORDER — TRAMADOL HCL 50 MG PO TABS: 50.0000 mg | ORAL_TABLET | Freq: Four times a day (QID) | ORAL | Status: DC | PRN

## 2012-04-04 MED ORDER — METHOCARBAMOL 500 MG PO TABS: 500.0000 mg | ORAL_TABLET | Freq: Four times a day (QID) | ORAL | Status: DC | PRN

## 2012-04-04 MED ORDER — ONDANSETRON HCL 4 MG PO TABS: 4.0000 mg | ORAL_TABLET | Freq: Four times a day (QID) | ORAL | Status: DC | PRN

## 2012-04-04 MED ORDER — POLYETHYLENE GLYCOL 3350 17 G PO PACK: 17.0000 g | PACK | Freq: Every day | ORAL | Status: DC | PRN

## 2012-04-04 MED ORDER — OXYCODONE HCL 5 MG PO TABS: 5.0000 mg | ORAL_TABLET | ORAL | Status: DC | PRN

## 2012-04-04 NOTE — Progress Notes (Signed)
Report called to Vernice RN at [facility]. Pt dressing dry and intact, VSS. No complaints, pain minimal. Pt d/c with packet to take to SNF. D/c via wheelchair to car with wife.

## 2012-04-04 NOTE — Progress Notes (Signed)
Physical Therapy Treatment Patient Details Name: Kollyn Lingafelter MRN: [id number] DOB: 1945/10/17 Today's Date: 04/04/2012 Time: 4401-0272 PT Time Calculation (min): 26 min  PT Assessment / Plan / Recommendation Comments on Treatment Session  Pt plans to D/C today vis car to SNF.  Assisted pt out of his recliner to amb in hallway then with spouse assisted pt in front seat of their car with instructions on proper tech and safety.    Follow Up Recommendations   (skilled nursing)     Does the patient have the potential to tolerate intense rehabilitation     Barriers to Discharge        Equipment Recommendations       Recommendations for Other Services    Frequency 7X/week   Plan      Precautions / Restrictions Restrictions Weight Bearing Restrictions: Yes   Pertinent Vitals/Pain C/o "some"    Mobility  Bed Mobility Bed Mobility: Not assessed Details for Bed Mobility Assistance: Pt OOB in recliner  Transfers Transfers: Sit to Stand;Stand to Sit Sit to Stand: 5: Supervision;From chair/3-in-1 Stand to Sit: 5: Supervision;To chair/3-in-1 Details for Transfer Assistance: good use of hands and safety tech  Ambulation/Gait Ambulation/Gait Assistance: 4: Min guard Ambulation Distance (Feet): 85 Feet Assistive device: Rolling walker Ambulation/Gait Assistance Details: increased time and < 25% VC's on safety with turns and backward gait sequencing Gait Pattern: Step-to pattern;Decreased stance time - left;Trunk flexed Gait velocity: decreased     PT Goals           progressing    Visit Information  Last PT Received On: 04/04/12 Assistance Needed: +1    Subjective Data      Cognition       Balance     End of Session PT - End of Session Equipment Utilized During Treatment: Gait belt Activity Tolerance: Patient tolerated treatment well Patient left: Other (comment) (car) Nurse Communication: Mobility status  Felecia Shelling  PTA [facility]  Acute  Rehab Pager      [phone]

## 2012-04-04 NOTE — Discharge Summary (Signed)
Physician Discharge Summary   Patient ID: Kyle Lewis MRN: [id number] DOB/AGE: April 19, 1946 66 y.o.  Admit date: 04/01/2012 Discharge date: 04/04/2012  Primary Diagnosis: Osteoarthritis Left knee   Admission Diagnoses:  Past Medical History  Diagnosis Date  . Diverticulosis   . Adenomatous colon polyp   . Sleep apnea     wears CPAP  . Allergy     seasonal  . Arthritis   . Cataract   . Hypertension   . Nocturia   . BPH (benign prostatic hyperplasia)   . Adrenal mass   . Chronic back pain    Discharge Diagnoses:   Principal Problem:  *OA (osteoarthritis) of knee Active Problems:  Postop Acute blood loss anemia  Postop Hypokalemia  Estimated Body mass index is 40.41 kg/(m^2) as calculated from the following:   Height as of this encounter: 5\' 7" (1.702 m).   Weight as of this encounter: 258 lb(117.028 kg).  Classification of overweight in adults according to BMI (WHO, 1998)   Procedure:  Procedure(s) (LRB): TOTAL KNEE ARTHROPLASTY (Left)   Consults: None  HPI: Kyle Lewis is a 66 y.o. year old male with end stage OA of his left knee with progressively worsening pain and dysfunction. He has constant pain, with activity and at rest and significant functional deficits with difficulties even with ADLs. He has had extensive non-op management including analgesics, injections of cortisone and viscosupplements, and home exercise program, but remains in significant pain with significant dysfunction. Radiographs show bone on bone arthritis medial and patellofemoral with large varus deformity. He presents now for left Total Knee Arthroplasty.   Laboratory Data: Admission on 04/01/2012  Component Date Value Range Status  . ABO/RH(D) 04/01/2012 O POS   Final  . Antibody Screen 04/01/2012 NEG   Final  . Sample Expiration 04/01/2012 04/04/2012   Final  . ABO/RH(D) 04/01/2012 O POS   Final  . WBC 04/02/2012 10.7* 4.0 - 10.5 K/uL Final  . RBC 04/02/2012 4.29  4.22 - 5.81 MIL/uL  Final  . Hemoglobin 04/02/2012 13.7  13.0 - 17.0 g/dL Final  . HCT 52/84/1324 37.6* 39.0 - 52.0 % Final  . MCV 04/02/2012 87.6  78.0 - 100.0 fL Final  . MCH 04/02/2012 31.9  26.0 - 34.0 pg Final  . MCHC 04/02/2012 36.4* 30.0 - 36.0 g/dL Final  . RDW 40/03/2724 12.8  11.5 - 15.5 % Final  . Platelets 04/02/2012 240  150 - 400 K/uL Final  . Sodium 04/02/2012 136  135 - 145 mEq/L Final  . Potassium 04/02/2012 3.4* 3.5 - 5.1 mEq/L Final  . Chloride 04/02/2012 98  96 - 112 mEq/L Final  . CO2 04/02/2012 29  19 - 32 mEq/L Final  . Glucose, Bld 04/02/2012 126* 70 - 99 mg/dL Final  . BUN 36/64/4034 9  6 - 23 mg/dL Final  . Creatinine, Ser 04/02/2012 0.49* 0.50 - 1.35 mg/dL Final  . Calcium 74/25/9563 8.8  8.4 - 10.5 mg/dL Final  . GFR calc non Af Amer 04/02/2012 >90  >90 mL/min Final  . GFR calc Af Amer 04/02/2012 >90  >90 mL/min Final   Comment:                                 The eGFR has been calculated                          using the CKD EPI equation.  This calculation has not been                          validated in all clinical                          situations.                          eGFR's persistently                          <90 mL/min signify                          possible Chronic Kidney Disease.  . WBC 04/03/2012 12.7* 4.0 - 10.5 K/uL Final  . RBC 04/03/2012 4.18* 4.22 - 5.81 MIL/uL Final  . Hemoglobin 04/03/2012 13.4  13.0 - 17.0 g/dL Final  . HCT 19/14/7829 37.4* 39.0 - 52.0 % Final  . MCV 04/03/2012 89.5  78.0 - 100.0 fL Final  . MCH 04/03/2012 32.1  26.0 - 34.0 pg Final  . MCHC 04/03/2012 35.8  30.0 - 36.0 g/dL Final  . RDW 56/21/3086 13.1  11.5 - 15.5 % Final  . Platelets 04/03/2012 241  150 - 400 K/uL Final  . Sodium 04/03/2012 136  135 - 145 mEq/L Final  . Potassium 04/03/2012 4.0  3.5 - 5.1 mEq/L Final  . Chloride 04/03/2012 98  96 - 112 mEq/L Final  . CO2 04/03/2012 31  19 - 32 mEq/L Final  . Glucose, Bld 04/03/2012 132* 70 - 99  mg/dL Final  . BUN 57/84/6962 15  6 - 23 mg/dL Final  . Creatinine, Ser 04/03/2012 0.59  0.50 - 1.35 mg/dL Final  . Calcium 95/28/4132 9.2  8.4 - 10.5 mg/dL Final  . GFR calc non Af Amer 04/03/2012 >90  >90 mL/min Final  . GFR calc Af Amer 04/03/2012 >90  >90 mL/min Final   Comment:                                 The eGFR has been calculated                          using the CKD EPI equation.                          This calculation has not been                          validated in all clinical                          situations.                          eGFR's persistently                          <90 mL/min signify                          possible Chronic Kidney Disease.  . WBC 04/04/2012 11.2* 4.0 -  10.5 K/uL Final  . RBC 04/04/2012 4.13* 4.22 - 5.81 MIL/uL Final  . Hemoglobin 04/04/2012 13.1  13.0 - 17.0 g/dL Final  . HCT 16/03/9603 37.1* 39.0 - 52.0 % Final  . MCV 04/04/2012 89.8  78.0 - 100.0 fL Final  . MCH 04/04/2012 31.7  26.0 - 34.0 pg Final  . MCHC 04/04/2012 35.3  30.0 - 36.0 g/dL Final  . RDW 54/02/8118 13.5  11.5 - 15.5 % Final  . Platelets 04/04/2012 261  150 - 400 K/uL Final  Hospital Outpatient Visit on 03/25/2012  Component Date Value Range Status  . aPTT 03/25/2012 28  24 - 37 seconds Final  . WBC 03/25/2012 10.3  4.0 - 10.5 K/uL Final  . RBC 03/25/2012 4.98  4.22 - 5.81 MIL/uL Final  . Hemoglobin 03/25/2012 16.0  13.0 - 17.0 g/dL Final  . HCT 14/78/2956 45.1  39.0 - 52.0 % Final  . MCV 03/25/2012 90.6  78.0 - 100.0 fL Final  . MCH 03/25/2012 32.1  26.0 - 34.0 pg Final  . MCHC 03/25/2012 35.5  30.0 - 36.0 g/dL Final  . RDW 21/30/8657 13.3  11.5 - 15.5 % Final  . Platelets 03/25/2012 276  150 - 400 K/uL Final  . Sodium 03/25/2012 139  135 - 145 mEq/L Final  . Potassium 03/25/2012 4.2  3.5 - 5.1 mEq/L Final  . Chloride 03/25/2012 101  96 - 112 mEq/L Final  . CO2 03/25/2012 30  19 - 32 mEq/L Final  . Glucose, Bld 03/25/2012 118* 70 - 99 mg/dL Final  .  BUN 84/69/6295 11  6 - 23 mg/dL Final  . Creatinine, Ser 03/25/2012 0.56  0.50 - 1.35 mg/dL Final  . Calcium 28/41/3244 9.7  8.4 - 10.5 mg/dL Final  . Total Protein 03/25/2012 7.1  6.0 - 8.3 g/dL Final  . Albumin 06/07/7251 4.0  3.5 - 5.2 g/dL Final  . AST 66/44/0347 19  0 - 37 U/L Final  . ALT 03/25/2012 25  0 - 53 U/L Final  . Alkaline Phosphatase 03/25/2012 93  39 - 117 U/L Final  . Total Bilirubin 03/25/2012 0.4  0.3 - 1.2 mg/dL Final  . GFR calc non Af Amer 03/25/2012 >90  >90 mL/min Final  . GFR calc Af Amer 03/25/2012 >90  >90 mL/min Final   Comment:                                 The eGFR has been calculated                          using the CKD EPI equation.                          This calculation has not been                          validated in all clinical                          situations.                          eGFR's persistently                          <  90 mL/min signify                          possible Chronic Kidney Disease.  Marland Kitchen Prothrombin Time 03/25/2012 12.5  11.6 - 15.2 seconds Final  . INR 03/25/2012 0.94  0.00 - 1.49 Final  . Color, Urine 03/25/2012 YELLOW  YELLOW Final  . APPearance 03/25/2012 CLEAR  CLEAR Final  . Specific Gravity, Urine 03/25/2012 1.015  1.005 - 1.030 Final  . pH 03/25/2012 5.0  5.0 - 8.0 Final  . Glucose, UA 03/25/2012 NEGATIVE  NEGATIVE mg/dL Final  . Hgb urine dipstick 03/25/2012 NEGATIVE  NEGATIVE Final  . Bilirubin Urine 03/25/2012 NEGATIVE  NEGATIVE Final  . Ketones, ur 03/25/2012 NEGATIVE  NEGATIVE mg/dL Final  . Protein, ur 16/03/9603 NEGATIVE  NEGATIVE mg/dL Final  . Urobilinogen, UA 03/25/2012 0.2  0.0 - 1.0 mg/dL Final  . Nitrite 54/02/8118 NEGATIVE  NEGATIVE Final  . Leukocytes, UA 03/25/2012 NEGATIVE  NEGATIVE Final   MICROSCOPIC NOT DONE ON URINES WITH NEGATIVE PROTEIN, BLOOD, LEUKOCYTES, NITRITE, OR GLUCOSE <1000 mg/dL.  Marland Kitchen MRSA, PCR 03/25/2012 POSITIVE* NEGATIVE Final   Comment: RESULT CALLED TO, READ BACK BY AND  VERIFIED WITH:                          ROBERTS M. BY GALLIMORE S. 1649 03/25/12  . Staphylococcus aureus 03/25/2012 POSITIVE* NEGATIVE Final   Comment:                                 The Xpert SA Assay (FDA                          approved for NASAL specimens                          in patients over 36 years of age),                          is one component of                          a comprehensive surveillance                          program.  Test performance has                          been validated by [employer] for patients greater                          than or equal to 68 year old.                          It is not intended                          to diagnose infection nor to  guide or monitor treatment.     X-Rays:Dg Chest 2 View  03/25/2012  *RADIOLOGY REPORT*  Clinical Data: Preoperative evaluation for left total knee replacement.  CHEST - 2 VIEW  Comparison: No priors.  Findings: Lung volumes are normal.  No consolidative airspace disease.  No pleural effusions.  No pneumothorax.  No pulmonary nodule or mass noted.  Pulmonary vasculature and the cardiomediastinal silhouette are within normal limits. Atherosclerosis in the thoracic aorta.  IMPRESSION: 1. No radiographic evidence of acute cardiopulmonary disease. 2.  Atherosclerosis   Original Report Authenticated By: Florencia Reasons, M.D.     EKG:No orders found for this or any previous visit.   Hospital Course:  Kyle Lewis is a 66 y.o. who was admitted to [facility]. They were brought to the operating room on 04/01/2012 and underwent Procedure(s): TOTAL KNEE ARTHROPLASTY.  Patient tolerated the procedure well and was later transferred to the recovery room and then to the orthopaedic floor for postoperative care.  They were given PO and IV analgesics for pain control following their surgery.  They were given 24 hours of postoperative antibiotics of    Anti-infectives     Start     Dose/Rate Route Frequency Ordered Stop   04/01/12 1800   ceFAZolin (ANCEF) IVPB 2 g/50 mL premix        2 g 100 mL/hr over 30 Minutes Intravenous Every 6 hours 04/01/12 1432 04/02/12 0116   04/01/12 0846   ceFAZolin (ANCEF) IVPB 2 g/50 mL premix        2 g 100 mL/hr over 30 Minutes Intravenous 60 min pre-op 04/01/12 0846 04/01/12 1120         and started on DVT prophylaxis in the form of Xarelto.   PT and OT were ordered for total joint protocol.  Discharge planning consulted to help with postop disposition and equipment needs.  Patient had a decent night on the evening of surgery and started to get up OOB with therapy on day one and walked over 100 feet.  Hemovac drain was pulled without difficulty.  Continued to work with therapy into day two.  Dressing was changed on day two and the incision was healing well.  By day three, the patient had progressed with therapy and meeting their goals.  Incision was healing well.  Patient was seen in rounds and was ready to go to [facility] for continued care.   Discharge Medications: Prior to Admission medications   Medication Sig Start Date End Date Taking? Authorizing Provider  finasteride (PROSCAR) 5 MG tablet Take 5 mg by mouth daily.   Yes Historical Provider, MD  losartan-hydrochlorothiazide (HYZAAR) 100-25 MG per tablet Take 1 tablet by mouth every morning.   Yes Historical Provider, MD  Tamsulosin HCl (FLOMAX) 0.4 MG CAPS Take 0.4 mg by mouth daily after supper.  06/07/11  Yes Historical Provider, MD  bisacodyl (DULCOLAX) 10 MG suppository Place 1 suppository (10 mg total) rectally daily as needed. 04/04/12   Nykira Reddix, PA  docusate sodium 100 MG CAPS Take 100 mg by mouth 2 (two) times daily. 04/04/12   Ranyia Witting Julien Girt, PA  methocarbamol (ROBAXIN) 500 MG tablet Take 1 tablet (500 mg total) by mouth every 6 (six) hours as needed. 04/04/12   Azlan Hanway, PA  ondansetron (ZOFRAN) 4 MG  tablet Take 1 tablet (4 mg total) by mouth every 6 (six) hours as needed for nausea. 04/04/12   Clydell Sposito, PA  oxyCODONE (OXY IR/ROXICODONE) 5 MG immediate release  tablet Take 1-4 tablets (5-20 mg total) by mouth every 3 (three) hours as needed. 04/04/12   Jerid Catherman, PA  polyethylene glycol (MIRALAX / GLYCOLAX) packet Take 17 g by mouth daily as needed. 04/04/12   Moshe Wenger Julien Girt, PA  rivaroxaban (XARELTO) 10 MG TABS tablet Take 1 tablet (10 mg total) by mouth daily with breakfast. Take Xarelto for two and a half more weeks, then discontinue Xarelto. Once the patient has completed the Xarelto, they may resume the 325 mg Aspirin. 04/04/12   Jules Vidovich Julien Girt, PA  traMADol (ULTRAM) 50 MG tablet Take 1-2 tablets (50-100 mg total) by mouth every 6 (six) hours as needed. 04/04/12   Zuri Lascala Julien Girt, PA    Diet: Cardiac diet Activity:WBAT Follow-up:in 2 weeks Disposition - Skilled nursing facility - [facility] Discharged Condition: good   Discharge Orders    Future Orders Please Complete By Expires   Diet - low sodium heart healthy      Call MD / Call 911      Comments:   If you experience chest pain or shortness of breath, CALL 911 and be transported to the hospital emergency room.  If you develope a fever above 101 F, pus (white drainage) or increased drainage or redness at the wound, or calf pain, call your surgeon's office.   Discharge instructions      Comments:   Pick up stool softner and laxative for home. Do not submerge incision under water. May shower. Continue to use ice for pain and swelling from surgery.  Take Xarelto for two and a half more weeks, then discontinue Xarelto. Once the patient has completed the Xarelto, they may resume the 325 mg Aspirin.  When discharged from the skilled rehab facility, please have the facility set up the patient's Home Health Physical Therapy prior to being released.  Also provide the patient with their  medications at time of release from the facility to include their pain medication, the muscle relaxants, and their blood thinner medication.  If the patient is still at the rehab facility at time of follow up appointment, please also assist the patient in arranging follow up appointment in our office and any transportation needs.   Constipation Prevention      Comments:   Drink plenty of fluids.  Prune juice may be helpful.  You may use a stool softener, such as Colace (over the counter) 100 mg twice a day.  Use MiraLax (over the counter) for constipation as needed.   Increase activity slowly as tolerated      Patient may shower      Comments:   You may shower without a dressing once there is no drainage.  Do not wash over the wound.  If drainage remains, do not shower until drainage stops.   Weight bearing as tolerated      Driving restrictions      Comments:   No driving until released by the physician.   Lifting restrictions      Comments:   No lifting until released by the physician.   TED hose      Comments:   Use stockings (TED hose) for 3 weeks on both leg(s).  You may remove them at night for sleeping.   Change dressing      Comments:   Change dressing daily with sterile 4 x 4 inch gauze dressing and apply TED hose. Do not submerge the incision under water.   Do not put a pillow under the knee. Place  it under the heel.      Do not sit on low chairs, stoools or toilet seats, as it may be difficult to get up from low surfaces          Medication List     As of 04/04/2012  7:57 AM    STOP taking these medications         aspirin 325 MG tablet      B Complex Caps      cholecalciferol 1000 UNITS tablet   Commonly known as: VITAMIN D      Fish Oil 1200 MG Caps      KRILL OIL PO      meloxicam 15 MG tablet   Commonly known as: MOBIC      multivitamin with minerals Tabs      OVER THE COUNTER MEDICATION      oxyCODONE-acetaminophen 10-325 MG per tablet   Commonly  known as: PERCOCET      TAKE these medications         bisacodyl 10 MG suppository   Commonly known as: DULCOLAX   Place 1 suppository (10 mg total) rectally daily as needed.      DSS 100 MG Caps   Take 100 mg by mouth 2 (two) times daily.      finasteride 5 MG tablet   Commonly known as: PROSCAR   Take 5 mg by mouth daily.      losartan-hydrochlorothiazide 100-25 MG per tablet   Commonly known as: HYZAAR   Take 1 tablet by mouth every morning.      methocarbamol 500 MG tablet   Commonly known as: ROBAXIN   Take 1 tablet (500 mg total) by mouth every 6 (six) hours as needed.      ondansetron 4 MG tablet   Commonly known as: ZOFRAN   Take 1 tablet (4 mg total) by mouth every 6 (six) hours as needed for nausea.      oxyCODONE 5 MG immediate release tablet   Commonly known as: Oxy IR/ROXICODONE   Take 1-4 tablets (5-20 mg total) by mouth every 3 (three) hours as needed.      polyethylene glycol packet   Commonly known as: MIRALAX / GLYCOLAX   Take 17 g by mouth daily as needed.      rivaroxaban 10 MG Tabs tablet   Commonly known as: XARELTO   Take 1 tablet (10 mg total) by mouth daily with breakfast. Take Xarelto for two and a half more weeks, then discontinue Xarelto.  Once the patient has completed the Xarelto, they may resume the 325 mg Aspirin.      Tamsulosin HCl 0.4 MG Caps   Commonly known as: FLOMAX   Take 0.4 mg by mouth daily after supper.      traMADol 50 MG tablet   Commonly known as: ULTRAM   Take 1-2 tablets (50-100 mg total) by mouth every 6 (six) hours as needed.           Follow-up Information    Follow up with Loanne Drilling, MD. Schedule an appointment as soon as possible for a visit in 2 weeks.   Contact information:   [address] [place] [address] [phone]          Signed: Patrica Duel 04/04/2012, 7:57 AM

## 2012-04-04 NOTE — Progress Notes (Signed)
   Subjective: 3 Days Post-Op Procedure(s) (LRB): TOTAL KNEE ARTHROPLASTY (Left) Patient reports pain as mild.   Patient seen in rounds with Dr. Lequita Halt. Patient is well, and has had no acute complaints or problems Patient is ready to go to [facility] today.  Objective: Vital signs in last 24 hours: Temp:  [98.2 F (36.8 C)-98.4 F (36.9 C)] 98.3 F (36.8 C) (10/31 0656) Pulse Rate:  [85-99] 85  (10/31 0656) Resp:  [16-18] 18  (10/31 0656) BP: (108-140)/(57-76) 140/69 mmHg (10/31 0656) SpO2:  [91 %-95 %] 92 % (10/31 0656)  Intake/Output from previous day:  Intake/Output Summary (Last 24 hours) at 04/04/12 0749 Last data filed at 04/04/12 0500  Gross per 24 hour  Intake    480 ml  Output   1300 ml  Net   -820 ml    Intake/Output this shift:    Labs:  Basename 04/04/12 0545 04/03/12 0435 04/02/12 0419  HGB 13.1 13.4 13.7    Basename 04/04/12 0545 04/03/12 0435  WBC 11.2* 12.7*  RBC 4.13* 4.18*  HCT 37.1* 37.4*  PLT 261 241    Basename 04/03/12 0435 04/02/12 0419  NA 136 136  K 4.0 3.4*  CL 98 98  CO2 31 29  BUN 15 9  CREATININE 0.59 0.49*  GLUCOSE 132* 126*  CALCIUM 9.2 8.8   No results found for this basename: LABPT:2,INR:2 in the last 72 hours  EXAM: General - Patient is Alert, Appropriate and Oriented Extremity - Neurovascular intact Sensation intact distally Dorsiflexion/Plantar flexion intact No cellulitis present Incision - clean, dry, no drainage, healing Motor Function - intact, moving foot and toes well on exam.   Assessment/Plan: 3 Days Post-Op Procedure(s) (LRB): TOTAL KNEE ARTHROPLASTY (Left) Procedure(s) (LRB): TOTAL KNEE ARTHROPLASTY (Left) Past Medical History  Diagnosis Date  . Diverticulosis   . Adenomatous colon polyp   . Sleep apnea     wears CPAP  . Allergy     seasonal  . Arthritis   . Cataract   . Hypertension   . Nocturia   . BPH (benign prostatic hyperplasia)   . Adrenal mass   . Chronic back pain    Principal  Problem:  *OA (osteoarthritis) of knee Active Problems:  Postop Acute blood loss anemia  Postop Hypokalemia  Estimated Body mass index is 40.41 kg/(m^2) as calculated from the following:   Height as of this encounter: 5\' 7" (1.702 m).   Weight as of this encounter: 258 lb(117.028 kg). Discharge to SNF Diet - Cardiac diet Follow up - in 2 weeks Activity - WBAT Disposition - Skilled nursing facility Condition Upon Discharge - Good D/C Meds - See DC Summary DVT Prophylaxis - Xarelto  Jones Viviani 04/04/2012, 7:49 AM

## 2012-04-04 NOTE — Progress Notes (Signed)
Clinical Social Work Department CLINICAL SOCIAL WORK PLACEMENT NOTE 04/04/2012  Patient:  Kyle Lewis, Kyle Lewis  Account Number:  [id number] Admit date:  04/01/2012  Clinical Social Worker:  Cori Razor, LCSW  Date/time:  04/02/2012 03:00 PM  Clinical Social Work is seeking post-discharge placement for this patient at the following level of care:   SKILLED NURSING   (*CSW will update this form in Epic as items are completed)     Patient/family provided with [facility] Department of Clinical Social Work's list of facilities offering this level of care within the geographic area requested by the patient (or if unable, by the patient's family).    Patient/family informed of their freedom to choose among providers that offer the needed level of care, that participate in Medicare, Medicaid or managed care program needed by the patient, have an available bed and are willing to accept the patient.    Patient/family informed of [facility]' ownership interest in [facility], as well as of the fact that they are under no obligation to receive care at this facility.  PASARR submitted to [employer] on 04/02/2012 PASARR number received from [employer] on   FL2 transmitted to all facilities in geographic area requested by pt/family on  04/02/2012 FL2 transmitted to all facilities within larger geographic area on   Patient informed that his/her managed care company has contracts with or will negotiate with  certain facilities, including the following:     Patient/family informed of bed offers received:  04/02/2012 Patient chooses bed at [facility] Physician recommends and patient chooses bed at    Patient to be transferred to [facility]  on  04/04/2012 Patient to be transferred to facility by Family transport  The following physician request were entered in Epic:   Additional Comments:  Cori Razor LCSW [phone]

## 2012-04-04 NOTE — Plan of Care (Signed)
Problem: Discharge Progression Outcomes Goal: Negotiates stairs Outcome: Adequate for Discharge Will address at SNF

## 2012-04-29 ENCOUNTER — Telehealth: Payer: Self-pay | Admitting: Vascular Surgery

## 2012-04-29 NOTE — Telephone Encounter (Signed)
unable to reach patient by phone no answer at home # and no voicemail on mobile mailed appt letter notifying him of an appt on  05/23/12 11am with dr. Darrick Penna

## 2012-04-30 ENCOUNTER — Other Ambulatory Visit: Payer: Self-pay | Admitting: *Deleted

## 2012-04-30 DIAGNOSIS — I6529 Occlusion and stenosis of unspecified carotid artery: Secondary | ICD-10-CM

## 2012-05-03 ENCOUNTER — Emergency Department (HOSPITAL_COMMUNITY): Payer: Medicare Other

## 2012-05-03 ENCOUNTER — Encounter (HOSPITAL_COMMUNITY): Payer: Self-pay | Admitting: Emergency Medicine

## 2012-05-03 ENCOUNTER — Inpatient Hospital Stay (HOSPITAL_COMMUNITY): Payer: Medicare Other

## 2012-05-03 ENCOUNTER — Inpatient Hospital Stay (HOSPITAL_COMMUNITY)
Admission: EM | Admit: 2012-05-03 | Discharge: 2012-05-05 | DRG: 066 | Disposition: A | Discharge status: Home / Self Care | Payer: Medicare Other | Attending: Internal Medicine | Admitting: Internal Medicine

## 2012-05-03 DIAGNOSIS — N400 Enlarged prostate without lower urinary tract symptoms: Secondary | ICD-10-CM

## 2012-05-03 DIAGNOSIS — M179 Osteoarthritis of knee, unspecified: Secondary | ICD-10-CM

## 2012-05-03 DIAGNOSIS — N138 Other obstructive and reflux uropathy: Secondary | ICD-10-CM | POA: Diagnosis present

## 2012-05-03 DIAGNOSIS — G8929 Other chronic pain: Secondary | ICD-10-CM

## 2012-05-03 DIAGNOSIS — N401 Enlarged prostate with lower urinary tract symptoms: Secondary | ICD-10-CM | POA: Diagnosis present

## 2012-05-03 DIAGNOSIS — R5381 Other malaise: Secondary | ICD-10-CM

## 2012-05-03 DIAGNOSIS — I6350 Cerebral infarction due to unspecified occlusion or stenosis of unspecified cerebral artery: Principal | ICD-10-CM

## 2012-05-03 DIAGNOSIS — F17200 Nicotine dependence, unspecified, uncomplicated: Secondary | ICD-10-CM | POA: Diagnosis present

## 2012-05-03 DIAGNOSIS — F3289 Other specified depressive episodes: Secondary | ICD-10-CM

## 2012-05-03 DIAGNOSIS — M1710 Unilateral primary osteoarthritis, unspecified knee: Secondary | ICD-10-CM

## 2012-05-03 DIAGNOSIS — Z860100 Personal history of colon polyps, unspecified: Secondary | ICD-10-CM

## 2012-05-03 DIAGNOSIS — E876 Hypokalemia: Secondary | ICD-10-CM

## 2012-05-03 DIAGNOSIS — F329 Major depressive disorder, single episode, unspecified: Secondary | ICD-10-CM

## 2012-05-03 DIAGNOSIS — G4730 Sleep apnea, unspecified: Secondary | ICD-10-CM

## 2012-05-03 DIAGNOSIS — G4733 Obstructive sleep apnea (adult) (pediatric): Secondary | ICD-10-CM | POA: Diagnosis present

## 2012-05-03 DIAGNOSIS — R1310 Dysphagia, unspecified: Secondary | ICD-10-CM

## 2012-05-03 DIAGNOSIS — I10 Essential (primary) hypertension: Secondary | ICD-10-CM

## 2012-05-03 DIAGNOSIS — I639 Cerebral infarction, unspecified: Secondary | ICD-10-CM

## 2012-05-03 DIAGNOSIS — Z79899 Other long term (current) drug therapy: Secondary | ICD-10-CM

## 2012-05-03 DIAGNOSIS — I359 Nonrheumatic aortic valve disorder, unspecified: Secondary | ICD-10-CM | POA: Diagnosis present

## 2012-05-03 DIAGNOSIS — Z7902 Long term (current) use of antithrombotics/antiplatelets: Secondary | ICD-10-CM

## 2012-05-03 DIAGNOSIS — Z96659 Presence of unspecified artificial knee joint: Secondary | ICD-10-CM

## 2012-05-03 DIAGNOSIS — M549 Dorsalgia, unspecified: Secondary | ICD-10-CM | POA: Diagnosis present

## 2012-05-03 DIAGNOSIS — K5730 Diverticulosis of large intestine without perforation or abscess without bleeding: Secondary | ICD-10-CM

## 2012-05-03 DIAGNOSIS — D62 Acute posthemorrhagic anemia: Secondary | ICD-10-CM

## 2012-05-03 HISTORY — DX: Cerebral infarction, unspecified: I63.9

## 2012-05-03 LAB — CBC
HCT: 41.8 % (ref 39.0–52.0)
Hemoglobin: 14 g/dL (ref 13.0–17.0)
MCH: 30 pg (ref 26.0–34.0)
MCHC: 33.5 g/dL (ref 30.0–36.0)
MCV: 89.5 fL (ref 78.0–100.0)
Platelets: 312 10*3/uL (ref 150–400)
RBC: 4.67 MIL/uL (ref 4.22–5.81)
RDW: 13.6 % (ref 11.5–15.5)
WBC: 8.8 10*3/uL (ref 4.0–10.5)

## 2012-05-03 LAB — PROTIME-INR
INR: 1.02 (ref 0.00–1.49)
Prothrombin Time: 13.3 seconds (ref 11.6–15.2)

## 2012-05-03 LAB — COMPREHENSIVE METABOLIC PANEL
ALT: 14 U/L (ref 0–53)
AST: 16 U/L (ref 0–37)
Albumin: 3.5 g/dL (ref 3.5–5.2)
Alkaline Phosphatase: 108 U/L (ref 39–117)
BUN: 17 mg/dL (ref 6–23)
CO2: 25 mEq/L (ref 19–32)
Calcium: 9.2 mg/dL (ref 8.4–10.5)
Chloride: 101 mEq/L (ref 96–112)
Creatinine, Ser: 0.65 mg/dL (ref 0.50–1.35)
GFR calc Af Amer: >90 mL/min (ref >90)
GFR calc non Af Amer: >90 mL/min (ref >90)
Glucose, Bld: 121 mg/dL — ABNORMAL HIGH (ref 70–99)
Potassium: 3.9 mEq/L (ref 3.5–5.1)
Sodium: 134 mEq/L — ABNORMAL LOW (ref 135–145)
Total Bilirubin: 0.4 mg/dL (ref 0.3–1.2)
Total Protein: 6.7 g/dL (ref 6.0–8.3)

## 2012-05-03 LAB — POCT I-STAT, CHEM 8
BUN: 17 mg/dL (ref 6–23)
Calcium, Ion: 1.12 mmol/L — ABNORMAL LOW (ref 1.13–1.30)
Chloride: 104 mEq/L (ref 96–112)
Creatinine, Ser: 0.8 mg/dL (ref 0.50–1.35)
Glucose, Bld: 114 mg/dL — ABNORMAL HIGH (ref 70–99)
HCT: 41 % (ref 39.0–52.0)
Hemoglobin: 13.9 g/dL (ref 13.0–17.0)
Potassium: 4 mEq/L (ref 3.5–5.1)
Sodium: 138 mEq/L (ref 135–145)
TCO2: 24 mmol/L (ref 0–100)

## 2012-05-03 LAB — TROPONIN I: Troponin I: <0.3 ng/mL (ref <0.30)

## 2012-05-03 LAB — DIFFERENTIAL
Basophils Absolute: 0 10*3/uL (ref 0.0–0.1)
Basophils Relative: 0 % (ref 0–1)
Eosinophils Absolute: 0.3 10*3/uL (ref 0.0–0.7)
Eosinophils Relative: 4 % (ref 0–5)
Lymphocytes Relative: 25 % (ref 12–46)
Lymphs Abs: 2.2 10*3/uL (ref 0.7–4.0)
Monocytes Absolute: 0.4 10*3/uL (ref 0.1–1.0)
Monocytes Relative: 4 % (ref 3–12)
Neutro Abs: 5.9 10*3/uL (ref 1.7–7.7)
Neutrophils Relative %: 67 % (ref 43–77)

## 2012-05-03 LAB — RAPID URINE DRUG SCREEN, HOSP PERFORMED
Amphetamines: NOT DETECTED
Barbiturates: NOT DETECTED
Benzodiazepines: NOT DETECTED
Cocaine: NOT DETECTED
Opiates: NOT DETECTED
Tetrahydrocannabinol: NOT DETECTED

## 2012-05-03 LAB — APTT: aPTT: 27 seconds (ref 24–37)

## 2012-05-03 LAB — GLUCOSE, CAPILLARY: Glucose-Capillary: 109 mg/dL — ABNORMAL HIGH (ref 70–99)

## 2012-05-03 MED ORDER — CLOPIDOGREL BISULFATE 75 MG PO TABS: 75.0000 mg | ORAL_TABLET | Freq: Every day | ORAL | Status: DC

## 2012-05-03 MED ORDER — B COMPLEX-C PO TABS
1.0000 | ORAL_TABLET | Freq: Every day | ORAL | Status: DC
  Administered 2012-05-04 – 2012-05-05 (×2): 1 via ORAL
  Filled 2012-05-03 (×2): qty 1

## 2012-05-03 MED ORDER — DOCUSATE SODIUM 100 MG PO CAPS
100.0000 mg | ORAL_CAPSULE | Freq: Two times a day (BID) | ORAL | Status: DC
  Administered 2012-05-03 – 2012-05-05 (×4): 100 mg via ORAL
  Filled 2012-05-03 (×4): qty 1

## 2012-05-03 MED ORDER — ENOXAPARIN SODIUM 40 MG/0.4ML ~~LOC~~ SOLN
40.0000 mg | SUBCUTANEOUS | Status: DC
  Administered 2012-05-03 – 2012-05-04 (×2): 40 mg via SUBCUTANEOUS
  Filled 2012-05-03 (×3): qty 0.4

## 2012-05-03 MED ORDER — SODIUM CHLORIDE 0.9 % IV SOLN
INTRAVENOUS | Status: DC
  Administered 2012-05-03 – 2012-05-04 (×2): via INTRAVENOUS

## 2012-05-03 MED ORDER — VITAMIN D (ERGOCALCIFEROL) 1.25 MG (50000 UNIT) PO CAPS: 50000.0000 [IU] | ORAL_CAPSULE | ORAL | Status: DC

## 2012-05-03 MED ORDER — ASPIRIN 81 MG PO CHEW
81.0000 mg | CHEWABLE_TABLET | Freq: Every day | ORAL | Status: DC
  Administered 2012-05-04: 81 mg via ORAL
  Filled 2012-05-03: qty 1

## 2012-05-03 MED ORDER — LOSARTAN POTASSIUM 50 MG PO TABS: 50.0000 mg | ORAL_TABLET | Freq: Every day | ORAL | Status: DC

## 2012-05-03 MED ORDER — GADOBENATE DIMEGLUMINE 529 MG/ML IV SOLN
20.0000 mL | Freq: Once | INTRAVENOUS | Status: AC | PRN
  Administered 2012-05-03: 20 mL via INTRAVENOUS

## 2012-05-03 MED ORDER — ASPIRIN 81 MG PO CHEW: 81.0000 mg | CHEWABLE_TABLET | Freq: Every day | ORAL | Status: DC

## 2012-05-03 MED ORDER — OXYCODONE HCL 5 MG PO TABS
5.0000 mg | ORAL_TABLET | ORAL | Status: DC | PRN
  Administered 2012-05-04 – 2012-05-05 (×4): 5 mg via ORAL
  Filled 2012-05-03 (×4): qty 1

## 2012-05-03 MED ORDER — OMEGA-3-ACID ETHYL ESTERS 1 G PO CAPS
2.0000 g | ORAL_CAPSULE | Freq: Two times a day (BID) | ORAL | Status: DC
  Administered 2012-05-03 – 2012-05-05 (×4): 2 g via ORAL
  Filled 2012-05-03 (×5): qty 2

## 2012-05-03 MED ORDER — FINASTERIDE 5 MG PO TABS
5.0000 mg | ORAL_TABLET | Freq: Every day | ORAL | Status: DC
  Administered 2012-05-04 – 2012-05-05 (×2): 5 mg via ORAL
  Filled 2012-05-03 (×2): qty 1

## 2012-05-03 MED ORDER — ONDANSETRON HCL 4 MG/2ML IJ SOLN: 4.0000 mg | Freq: Four times a day (QID) | INTRAMUSCULAR | Status: DC | PRN

## 2012-05-03 MED ORDER — VITAMIN C 500 MG PO TABS
500.0000 mg | ORAL_TABLET | Freq: Every day | ORAL | Status: DC
  Administered 2012-05-04 – 2012-05-05 (×2): 500 mg via ORAL
  Filled 2012-05-03 (×2): qty 1

## 2012-05-03 MED ORDER — CLOPIDOGREL BISULFATE 75 MG PO TABS
75.0000 mg | ORAL_TABLET | Freq: Every day | ORAL | Status: DC
  Administered 2012-05-04 – 2012-05-05 (×2): 75 mg via ORAL
  Filled 2012-05-03 (×3): qty 1

## 2012-05-03 MED ORDER — TRAMADOL HCL 50 MG PO TABS: 50.0000 mg | ORAL_TABLET | Freq: Four times a day (QID) | ORAL | Status: DC | PRN

## 2012-05-03 MED ORDER — POLYETHYLENE GLYCOL 3350 17 G PO PACK
17.0000 g | PACK | Freq: Every day | ORAL | Status: DC | PRN
  Filled 2012-05-03: qty 1

## 2012-05-03 MED ORDER — ADULT MULTIVITAMIN W/MINERALS CH
1.0000 | ORAL_TABLET | Freq: Every day | ORAL | Status: DC
  Administered 2012-05-04 – 2012-05-05 (×3): 1 via ORAL
  Filled 2012-05-03 (×3): qty 1

## 2012-05-03 MED ORDER — TAMSULOSIN HCL 0.4 MG PO CAPS
0.4000 mg | ORAL_CAPSULE | Freq: Every day | ORAL | Status: DC
  Administered 2012-05-04 (×2): 0.4 mg via ORAL
  Filled 2012-05-03 (×3): qty 1

## 2012-05-03 NOTE — ED Notes (Addendum)
Last wed symptoms; went to [facility] on Thursday; c/o unresolved lt. Arm numbness; lt upper lip num - started on rt. Side; resolved rt. Tongue; front teeth numbness. No dysarthria speech, normal gait. Total knee replacement (l) on October 28th. MRI should mini strokes.

## 2012-05-03 NOTE — ED Notes (Addendum)
EDP in room at this time. Pt reports left side mouth feeling numb. Pt has had multiple test at [facility]- MRI, [place], CT for same s/s of numbness over last week. 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

## 2012-05-03 NOTE — Progress Notes (Signed)
The PT has refused our CPAP machine tonight. Kyle Lewis says he does have a CPAP at home but he is feeling comfortable right now without one. PT also states that when his last o2 sat was checked it was good and he is no distress on room air. RT advised PT to let us know if he changes his mind. RT will continue to monitor.

## 2012-05-03 NOTE — Consult Note (Signed)
Reason for Consult: Stroke Referring Physician: Ignacia Palma, A  CC: left sided numbness  History is obtained from:PAtient, wife  HPI: Kim Lauver is a 66 y.o. male who presented to [place] [facility] last week with transient left sided numbness. He was arranged for a TIA workup and his CTA showed carotid occlusion, and his MRI showed an acute infarct in the right hemisphere. He also was seen to have an old stroke in the left frontal lobe. He was stable until earlier today when he had some more numbness in his tongue and hand and therefore presented for evaluation here.   LSN: last week tpa given: no, recent stroke NIHSS: 1  ROS: A 14 point ROS was performed and is negative except as noted in the HPI.  Past Medical History  Diagnosis Date  . Diverticulosis   . Adenomatous colon polyp   . Sleep apnea     wears CPAP  . Allergy     seasonal  . Arthritis   . Cataract   . Hypertension   . Nocturia   . BPH (benign prostatic hyperplasia)   . Adrenal mass   . Chronic back pain   . CVA (cerebral infarction)     Family History: Several family members with stroke  Social History: Tob: +smoker, trying to quit  Exam: Current vital signs: BP 131/64  Pulse 68  Temp 97.1 F (36.2 C) (Oral)  Resp 18  SpO2 100% Vital signs in last 24 hours: Temp:  [97.1 F (36.2 C)] 97.1 F (36.2 C) (11/29 1904) Pulse Rate:  [68-82] 68  (11/29 1715) Resp:  [14-18] 18  (11/29 1715) BP: (122-131)/(64-93) 131/64 mmHg (11/29 1715) SpO2:  [97 %-100 %] 100 % (11/29 1715)  General: in bed, NAD CV: RRR Mental Status: Patient is awake, alert, oriented to person, place, month, year, and situation. Immediate and remote memory are intact. Patient is able to give a clear and coherent history. Cranial Nerves: II: Visual Fields are full. Pupils are equal, round, and reactive to light.  Discs are difficult to visualize. III,IV, VI: EOMI without ptosis or diploplia.  V: Facial sensation is symmetric to  temperature VII: Facial movement is symmetric.  VIII: hearing is intact to voice X: Uvula elevates symmetrically XI: Shoulder shrug is symmetric. XII: tongue is midline without atrophy or fasciculations.  Motor: Tone is normal. Bulk is normal. 5/5 strength was present in all four extremities with the exception of mild APB weakness on the right.  Sensory: Sensation is symmetric to light touch and temperature in the arms and legs. Deep Tendon Reflexes: 2+ and symmetric in the biceps and patellae.  Plantars: Toes are downgoing bilaterally.  Cerebellar: FNF intact bilaterally Gait: Antalgic gait.  I have reviewed labs in epic and the results pertinent to this consultation are: BMP, cbc - unremarkable  I have reviewed the images obtained:  CT head - old left frontal and subacute right posterior fontal lobe infarct.  Impression: 66 yo M with right and left cortical infarcts. He right is likely related to his carotid occlusion, but his left appears embolic. He has not had an embolic workup and will need to be admitted for his sunacute stroke that has had an incomplete workup.   Recommendations: 1. HgbA1c, fasting lipid panel 2. MRI of the brain without contrast to see if his stroke has extended. (Previous MRI viewable with canopy pacs) 3. PT consult, OT consult, Speech consult 4. Echocardiogram 5. Carotid dopplers are not needed 6. Prophylactic therapy- already on asa +  plavix, if for cardiac reasons would continue, but for soley secondary stroke prophylaxis, typically I use monotherapy.  7. Risk factor modification 8. Telemetry monitoring 9. Frequent neuro checks 10 Permissive hypertension(would hold anti-hypertensives)   Ritta Slot, MD [facility] [phone]  If 7pm- 7am, please page neurology on call at [phone].

## 2012-05-03 NOTE — H&P (Signed)
[facility] History and Physical  Frisco Cordts JXB:[id number] DOB: 1945-12-13 DOA: 05/03/2012  Referring physician:  PCP: [name], MD  Specialists:   Chief Complaint: Left upper lip numbness.  HPI: Kyle Lewis is a 66 y.o. male with history of recent CVA (right frontal lobe) diagnosed at [facility] last week who presents with above complaints. He states that about 9 days ago he had some strokelike symptoms and-numbness on the right side of his tongue with left upper extremity weakness and so went to [facility] the next day and he was placed on Plavix and instructed to continue taking that along with his aspirin. He was then discharged home on as to followup for imaging studies this week. 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 MRI of brain on 04/30/12 showed right frontal subacute stroke. 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 He reports he was set up to see a vascular surgeon on 12/13. Today he began having new left upper lip numbness, the left forearm or weakness was about the same as had been-he came to the ED. He denies blurry vision, dysphagia, slurred speech chest pain fevers and no cough. He was seen in the ED and a CT scan of his head showed a subacute infarct consistent with the one seen on MRI on 1126. An MRI was ordered to Neurology was consulted on admission to the hospitalist service recommended further evaluation and management.    Review of Systems: The patient denies anorexia, fever, weight loss,, vision loss, decreased hearing, hoarseness, chest pain, syncope, dyspnea on exertion, peripheral edema, balance deficits, hemoptysis, abdominal pain, melena, hematochezia, severe indigestion/heartburn, hematuria, incontinence, transient blindness,  difficulty walking, depression, unusual weight change, abnormal bleeding, enlarged lymph nodes, angioedema, and breast masses.    Past Medical History  Diagnosis Date  . Diverticulosis   . Adenomatous colon polyp   . Sleep apnea     wears CPAP  . Allergy     seasonal  . Arthritis   . Cataract   . Hypertension   . Nocturia   . BPH (benign prostatic hyperplasia)   . Adrenal mass   . Chronic back pain   . CVA (cerebral infarction)    Past Surgical History  Procedure Date  . Umbilical hernia repair   . Tonsillectomy   . Appendectomy   . Brain meningioma excision   . Carpal tunnel release     left  . Cataracts     both eyes  . Inguinal hernia repair   . Transurethral resection of prostate   . 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  He has never used smokeless tobacco. He reports that he drinks alcohol. He reports that he does not use illicit drugs.  where does patient live--home with wife   No Known Allergies  Family History  Problem Relation Age of Onset  . Prostate cancer Maternal Grandfather   . Colon cancer Neg Hx   . Esophageal cancer Neg Hx   . Rectal cancer Neg Hx   . Stomach cancer Neg Hx   mother  With multiple TIAs and uncle had  massive CVA at age 25  Prior to Admission medications   Medication Sig Start Date End Date Taking? Authorizing Provider  aspirin 81 MG chewable tablet Chew 162 mg by mouth daily.   Yes Historical Provider, MD  B Complex-C (B-COMPLEX WITH VITAMIN C) tablet Take 1 tablet by mouth daily.   Yes Historical Provider, MD  clopidogrel (PLAVIX) 75 MG tablet Take 75 mg by mouth daily.   Yes Historical Provider, MD  docusate sodium 100 MG CAPS Take 100 mg by mouth 2 (two) times daily. 04/04/12  Yes Alexzandrew Julien Girt, PA  finasteride (PROSCAR) 5 MG tablet Take 5 mg  by mouth daily.   Yes Historical Provider, MD  losartan-hydrochlorothiazide (HYZAAR) 100-25 MG per tablet Take 1 tablet by mouth every morning.   Yes Historical Provider, MD  meloxicam (MOBIC) 15 MG tablet Take 15 mg by mouth daily.   Yes Historical Provider, MD  Multiple Vitamin (MULTIVITAMIN WITH MINERALS) TABS Take 1 tablet by mouth daily.   Yes Historical Provider, MD  omega-3 acid ethyl esters (LOVAZA) 1 G capsule Take 2 g by mouth 2 (two) times daily.   Yes Historical Provider, MD  oxyCODONE (OXY IR/ROXICODONE) 5 MG immediate release tablet Take 1-4 tablets (5-20 mg total) by mouth every 3 (three) hours as needed. 04/04/12  Yes Alexzandrew Julien Girt, PA  polyethylene glycol (MIRALAX / GLYCOLAX) packet Take 17 g by mouth daily as needed. 04/04/12  Yes Alexzandrew Julien Girt, PA  rivaroxaban (XARELTO) 10 MG TABS tablet Take 1 tablet (10 mg total) by mouth daily with breakfast. Take Xarelto for two and a half more weeks, then discontinue Xarelto. Once the patient has completed the Xarelto, they may resume the 325 mg Aspirin. 04/04/12  Yes Alexzandrew Julien Girt, PA  Tamsulosin HCl (FLOMAX) 0.4 MG CAPS Take 0.4 mg by mouth daily after supper.  06/07/11  Yes Historical Provider, MD  traMADol (ULTRAM) 50 MG tablet Take 1-2 tablets (50-100 mg total) by mouth every 6 (six) hours as needed. 04/04/12  Yes Alexzandrew Julien Girt, PA  vitamin C (ASCORBIC ACID) 500 MG tablet Take 500 mg by mouth daily.   Yes Historical Provider, MD  Vitamin D, Ergocalciferol, (DRISDOL) 50000 UNITS CAPS Take 50,000 Units by mouth every 7 (seven) days.   Yes Historical Provider, MD   Physical Exam: Filed Vitals:   05/03/12 1533 05/03/12 1715 05/03/12 1904  BP: 122/93 131/64   Pulse: 82 68   Temp: 97.1 F (36.2 C)  97.1 F (36.2 C)  TempSrc: Oral    Resp: 14 18   SpO2: 97% 100%     Constitutional: Vital signs reviewed.  Patient is a well-developed and well-nourished  in no acute distress and cooperative with exam. Alert and  oriented x3.  Head: Normocephalic and atraumatic Mouth: no erythema or exudates, MMM Eyes: PERRL, EOMI, conjunctivae normal, No scleral icterus.  Neck: Supple, Trachea midline normal ROM, No JVD, mass, thyromegaly, or carotid bruit present.  Cardiovascular: RRR, S1 normal, S2 normal, no MRG, pulses symmetric and intact bilaterally Pulmonary/Chest: CTAB, no wheezes, rales, or rhonchi Abdominal: Soft. Non-tender, non-distended, bowel sounds are normal, no masses, organomegaly, or guarding present.  GU: no CVA tenderness Extremities: No cyanosis and no edema, well-healed midline incision over left knee  Neurological: A&O x3, no facial asymmetry Strength -right upper extremity 5/5, left upper extremity 4+/5, also normal strength in lower extremities bilaterally-symmetric. cranial nerve II-XII are grossly intact, no focal motor deficit, sensory intact to light touch bilaterally.  Skin: Warm, dry and intact. No rash,  cyanosis, or clubbing.  Psychiatric: Normal mood and affect. speech and behavior is normal. Judgment and thought content normal. Cognition and memory are normal.    Labs on Admission:  Basic Metabolic Panel:  Lab 05/03/12 9604 05/03/12 1613  NA 138 134*  K 4.0 3.9  CL 104 101  CO2 -- 25  GLUCOSE 114* 121*  BUN 17 17  CREATININE 0.80 0.65  CALCIUM -- 9.2  MG -- --  PHOS -- --   Liver Function Tests:  Lab 05/03/12 1613  AST 16  ALT 14  ALKPHOS 108  BILITOT 0.4  PROT 6.7  ALBUMIN 3.5   No results found for this basename: LIPASE:5,AMYLASE:5 in the last 168 hours No results found for this basename: AMMONIA:5 in the last 168 hours CBC:  Lab 05/03/12 1652 05/03/12 1613  WBC -- 8.8  NEUTROABS -- 5.9  HGB 13.9 14.0  HCT 41.0 41.8  MCV -- 89.5  PLT -- 312   Cardiac Enzymes:  Lab 05/03/12 1613  CKTOTAL --  CKMB --  CKMBINDEX --  TROPONINI <0.30    BNP (last 3 results) No results found for this basename: PROBNP:3 in the last 8760 hours CBG:  Lab 05/03/12  1634  GLUCAP 109*    Radiological Exams on Admission: Ct Head Wo Contrast  05/03/2012  *RADIOLOGY REPORT*  Clinical Data: Left perioral numbness.  CT HEAD WITHOUT CONTRAST  Technique:  Contiguous axial images were obtained from the base of the skull through the vertex without contrast.  Comparison: Multiple exams including MRI brain from [facility] dated 04/30/2012  Findings: Prior left frontal craniotomy with underlying encephalomalacia.  Hypodensity along the posterior portion the right frontal lobe corresponds with region of subacute infarct shown on the MRI from 3 days ago.  No hemorrhagic transformation observed.  The brain stem, cerebellum, cerebral peduncles, thalami, and basal ganglia appear intact.  Periventricular and corona radiata white matter hypodensities are most compatible with chronic ischemic microvascular white matter disease.  Ventricular system unremarkable.  IMPRESSION:  1.  Hypodensity posteriorly in the right frontal lobe in the vicinity of the previously identified subacute infarct shown on MRI from [facility] 3 days ago, compatible with expected evolutionary changes. 2.  Chronic microvascular white matter disease. 3.  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 4.  No intracranial hemorrhage is observed.  I have contacted [employer] personnel to merge this exam with PACS records from several institutions.  In the meantime, his other imaging is available in [place] PACS under the name ELISHUA RADFORD.   Original Report Authenticated By: Gaylyn Rong, M.D.       Assessment/Plan Principal Problem:  *CVA (cerebral infarction) -As discussed above, will continue Plavix and aspirin 81 mg -Follow up and review pending results of MRI and MRA of head and neck. -Ordered 2-D echo which was not done at [facility]. -Neurology consulted, await their evaluation and further recommendations. Active Problems:  Hypertension -Continue Cozaar, monitor blood pressures and further  treat accordingly allowing for permissive hypertension. Hold off HCTZ for now given mild hyponatremia noted initially  Sleep apnea - CPAP per RT  Chronic back pain -Continue pain management  BPH (benign prostatic hyperplasia) - continue outpatient medications Recent left total knee replacement -PT consult, follow.   Code Status: full Family Communication: wife at bedside Disposition Plan: admit to tele  Time spent: >23mins  Kela Millin [facility] Pager [phone]  If 7PM-7AM, please contact night-coverage [email or web address] Password [facility] 05/03/2012, 7:40 PM

## 2012-05-03 NOTE — ED Provider Notes (Signed)
History     CSN: [id number]  Arrival date & time 05/03/12  1526   First MD Initiated Contact with Patient 05/03/12 1547      No chief complaint on file.   (Consider location/radiation/quality/duration/timing/severity/associated sxs/prior treatment) Patient is a 66 y.o. male presenting with Acute Neurological Problem. The history is provided by the patient. No language interpreter was used.  Cerebrovascular Accident This is a recurrent problem. The current episode started 6 to 12 hours ago (Pt is a man with history of a stroke on April 25, 2012, who today has developed left hand weakness and numbness in the left upper lip.  He called his doctor who advised him to be checked at [facility] ED and to have neurology consult.). The problem occurs constantly. The problem has not changed since onset.Associated symptoms comments: None . Nothing aggravates the symptoms. Nothing (He took his aspirin and plavix and pravachol.  ) relieves the symptoms. Treatments tried: He took his regular medications without any change in symptoms.    Past Medical History  Diagnosis Date  . Diverticulosis   . Adenomatous colon polyp   . Sleep apnea     wears CPAP  . Allergy     seasonal  . Arthritis   . Cataract   . Hypertension   . Nocturia   . BPH (benign prostatic hyperplasia)   . Adrenal mass   . Chronic back pain     Past Surgical History  Procedure Date  . Umbilical hernia repair   . Tonsillectomy   . Appendectomy   . Brain meningioma excision   . Carpal tunnel release     left  . Cataracts     both eyes  . Inguinal hernia repair   . Transurethral resection of prostate   . 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 Prostate cancer Maternal Grandfather   . Colon cancer Neg Hx   . Esophageal cancer Neg Hx   .  Rectal cancer Neg Hx   . Stomach cancer Neg Hx     History  Substance Use Topics  . Smoking status: Current Every Day Smoker -- 1.0 packs/day  . Smokeless tobacco: Never Used     Comment: Counseling sheet given in exam room 08-2011   . Alcohol Use: Yes     Comment: rare      Review of Systems  Constitutional: Negative for fever and chills.  HENT:       Left upper lip numbess.  Eyes: Negative.   Respiratory: Negative.   Cardiovascular: Negative.   Gastrointestinal: Negative.   Genitourinary: Negative.   Musculoskeletal:       Left knee pain post recent left TKR.  Neurological: Positive for weakness and numbness.       Numbness of left upper lip and weakness in left hand.  Psychiatric/Behavioral: Negative.     Allergies  Review of patient's allergies indicates no known allergies.  Home Medications   Current Outpatient Rx  Name  Route  Sig  Dispense  Refill  . DSS 100 MG PO CAPS   Oral   Take 100 mg by mouth 2 (two) times daily.   60 capsule   0   . FINASTERIDE 5 MG PO TABS   Oral   Take 5 mg by mouth daily.         Marland Kitchen  LOSARTAN POTASSIUM-HCTZ 100-25 MG PO TABS   Oral   Take 1 tablet by mouth every morning.         . OXYCODONE HCL 5 MG PO TABS   Oral   Take 1-4 tablets (5-20 mg total) by mouth every 3 (three) hours as needed.   120 tablet   0   . POLYETHYLENE GLYCOL 3350 PO PACK   Oral   Take 17 g by mouth daily as needed.   14 each   0   . RIVAROXABAN 10 MG PO TABS   Oral   Take 1 tablet (10 mg total) by mouth daily with breakfast. Take Xarelto for two and a half more weeks, then discontinue Xarelto. Once the patient has completed the Xarelto, they may resume the 325 mg Aspirin.   18 tablet   0   . TAMSULOSIN HCL 0.4 MG PO CAPS   Oral   Take 0.4 mg by mouth daily after supper.          . TRAMADOL HCL 50 MG PO TABS   Oral   Take 1-2 tablets (50-100 mg total) by mouth every 6 (six) hours as needed.   60 tablet   0     BP 122/93  Pulse  82  Temp 97.1 F (36.2 C) (Oral)  Resp 14  SpO2 97%  Physical Exam  Nursing note and vitals reviewed. Constitutional: He is oriented to person, place, and time. He appears well-developed and well-nourished. No distress.  HENT:  Head: Normocephalic and atraumatic.  Right Ear: External ear normal.  Left Ear: External ear normal.  Mouth/Throat: Oropharynx is clear and moist.  Eyes: Conjunctivae normal and EOM are normal. Pupils are equal, round, and reactive to light.  Neck: Normal range of motion. Neck supple.       No right carotid pulse.  Faint bruit in left carotid.    Cardiovascular: Normal rate, regular rhythm and normal heart sounds.   Pulmonary/Chest: Effort normal and breath sounds normal.  Abdominal: Soft. Bowel sounds are normal.       Multiple abdominal scars from prior surgery.  Musculoskeletal:       Has recent L TKR scar on anterior knee.  His left knee is nontender with full ROM.   Neurological: He is alert and oriented to person, place, and time.       No facial asymmetry.  Has qualitative numb feeling in left upper lip.  No sensory or motor deficit.  I could not detect left grip weakness.    Skin: Skin is warm and dry.  Psychiatric: He has a normal mood and affect. His behavior is normal.    ED Course  Procedures (including critical care time)   Labs Reviewed  PROTIME-INR  APTT  CBC  DIFFERENTIAL  COMPREHENSIVE METABOLIC PANEL  TROPONIN I  URINE RAPID DRUG SCREEN (HOSP PERFORMED)   I reviewed pt's paper work.  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   MRI of brain on 04/30/12 showed right frontal subacute stroke.  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  Neurology consult requested.   4:45 PM Discussed with Neurology who requested MRI  of brain, MRA of head and neck.   Date: 05/03/2012  Rate: 70  Rhythm: normal sinus rhythm  QRS  Axis: normal  Intervals: normal  ST/T Wave abnormalities: normal  Conduction Disutrbances:none  Narrative Interpretation: Normal EKG  Old EKG Reviewed: none available  5:55 PM Results for orders placed during the hospital encounter of 05/03/12  PROTIME-INR      Component Value Range   Prothrombin Time 13.3  11.6 - 15.2 seconds   INR 1.02  0.00 - 1.49  APTT      Component Value Range   aPTT 27  24 - 37 seconds  CBC      Component Value Range   WBC 8.8  4.0 - 10.5 K/uL   RBC 4.67  4.22 - 5.81 MIL/uL   Hemoglobin 14.0  13.0 - 17.0 g/dL   HCT 16.1  09.6 - 04.5 %   MCV 89.5  78.0 - 100.0 fL   MCH 30.0  26.0 - 34.0 pg   MCHC 33.5  30.0 - 36.0 g/dL   RDW 40.9  81.1 - 91.4 %   Platelets 312  150 - 400 K/uL  DIFFERENTIAL      Component Value Range   Neutrophils Relative 67  43 - 77 %   Neutro Abs 5.9  1.7 - 7.7 K/uL   Lymphocytes Relative 25  12 - 46 %   Lymphs Abs 2.2  0.7 - 4.0 K/uL   Monocytes Relative 4  3 - 12 %   Monocytes Absolute 0.4  0.1 - 1.0 K/uL   Eosinophils Relative 4  0 - 5 %   Eosinophils Absolute 0.3  0.0 - 0.7 K/uL   Basophils Relative 0  0 - 1 %   Basophils Absolute 0.0  0.0 - 0.1 K/uL  COMPREHENSIVE METABOLIC PANEL      Component Value Range   Sodium 134 (*) 135 - 145 mEq/L   Potassium 3.9  3.5 - 5.1 mEq/L   Chloride 101  96 - 112 mEq/L   CO2 25  19 - 32 mEq/L   Glucose, Bld 121 (*) 70 - 99 mg/dL   BUN 17  6 - 23 mg/dL   Creatinine, Ser 7.82  0.50 - 1.35 mg/dL   Calcium 9.2  8.4 - 95.6 mg/dL   Total Protein 6.7  6.0 - 8.3 g/dL   Albumin 3.5  3.5 - 5.2 g/dL   AST 16  0 - 37 U/L   ALT 14  0 - 53 U/L   Alkaline Phosphatase 108  39 - 117 U/L   Total Bilirubin 0.4  0.3 - 1.2 mg/dL   GFR calc non Af Amer >90  >90 mL/min   GFR calc Af Amer >90  >90 mL/min  TROPONIN I      Component Value Range   Troponin I <0.30  <0.30 ng/mL  URINE RAPID DRUG SCREEN (HOSP  PERFORMED)      Component Value Range   Opiates NONE DETECTED  NONE DETECTED   Cocaine NONE DETECTED  NONE DETECTED   Benzodiazepines NONE DETECTED  NONE DETECTED   Amphetamines NONE DETECTED  NONE DETECTED   Tetrahydrocannabinol NONE DETECTED  NONE DETECTED   Barbiturates NONE DETECTED  NONE DETECTED  GLUCOSE, CAPILLARY      Component Value Range   Glucose-Capillary 109 (*) 70 - 99 mg/dL  POCT I-STAT, CHEM 8      Component Value Range   Sodium 138  135 - 145 mEq/L   Potassium 4.0  3.5 - 5.1 mEq/L   Chloride 104  96 - 112 mEq/L   BUN 17  6 - 23  mg/dL   Creatinine, Ser 1.61  0.50 - 1.35 mg/dL   Glucose, Bld 096 (*) 70 - 99 mg/dL   Calcium, Ion 0.45 (*) 1.13 - 1.30 mmol/L   TCO2 24  0 - 100 mmol/L   Hemoglobin 13.9  13.0 - 17.0 g/dL   HCT 40.9  81.1 - 91.4 %   Ct Head Wo Contrast  05/03/2012  *RADIOLOGY REPORT*  Clinical Data: Left perioral numbness.  CT HEAD WITHOUT CONTRAST  Technique:  Contiguous axial images were obtained from the base of the skull through the vertex without contrast.  Comparison: Multiple exams including MRI brain [facility] dated 04/30/2012  Findings: Prior left frontal craniotomy with underlying encephalomalacia.  Hypodensity along the posterior portion the right frontal lobe corresponds with region of subacute infarct shown on the MRI from 3 days ago.  No hemorrhagic transformation observed.  The brain stem, cerebellum, cerebral peduncles, thalami, and basal ganglia appear intact.  Periventricular and corona radiata white matter hypodensities are most compatible with chronic ischemic microvascular white matter disease.  Ventricular system unremarkable.  IMPRESSION:  1.  Hypodensity posteriorly in the right frontal lobe in the vicinity of the previously identified subacute infarct shown on MRI from [facility] 3 days ago, compatible with expected evolutionary changes. 2.  Chronic microvascular white matter disease. 3.  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 4.  No intracranial hemorrhage is observed.  I have contacted [employer] personnel to merge this exam with PACS records from several institutions.  In the meantime, his other imaging is available in [place] PACS under the name [facility].   Original Report Authenticated By: Gaylyn Rong, M.D.    5:57 PM  Case discussed with Onalee Hua, M.D., neurologist, who advised that pt needs to be admitted for completion of his stroke workup with echocardiogram, ? Vascular surgery consult.  Call to [facility] to admit him.    6:18 PM Case discussed with Dr. Donna Bernard.  Admit to [facility] to a telemetry bed.  1. CVA (cerebral infarction)            Carleene Cooper III, MD 05/03/12 Rickey Primus

## 2012-05-04 ENCOUNTER — Encounter (HOSPITAL_COMMUNITY): Payer: Self-pay | Admitting: *Deleted

## 2012-05-04 DIAGNOSIS — F329 Major depressive disorder, single episode, unspecified: Secondary | ICD-10-CM

## 2012-05-04 DIAGNOSIS — N400 Enlarged prostate without lower urinary tract symptoms: Secondary | ICD-10-CM

## 2012-05-04 DIAGNOSIS — D62 Acute posthemorrhagic anemia: Secondary | ICD-10-CM

## 2012-05-04 LAB — LIPID PANEL
Cholesterol: 127 mg/dL (ref 0–200)
HDL: 37 mg/dL — ABNORMAL LOW (ref >39)
LDL Cholesterol: 63 mg/dL (ref 0–99)
Total CHOL/HDL Ratio: 3.4 RATIO
Triglycerides: 137 mg/dL (ref <150)
VLDL: 27 mg/dL (ref 0–40)

## 2012-05-04 LAB — HEMOGLOBIN A1C
Hgb A1c MFr Bld: 5.5 % (ref <5.7)
Mean Plasma Glucose: 111 mg/dL (ref <117)

## 2012-05-04 LAB — MRSA PCR SCREENING: MRSA by PCR: NEGATIVE

## 2012-05-04 NOTE — Progress Notes (Signed)
[facility] PROGRESS NOTE  Alp Goldwater OZH:[id number] DOB: April 26, 1946 DOA: 05/03/2012 PCP: [name], MD  Assessment/Plan: Principal Problem:  *CVA (cerebral infarction)  -will d/c ASA, continue Plavix only as per Neuro recs - MRI  Reveals recent CVA, no acute findings, and MRA with  occlusion R. ICA  Which is non surgical and so neuro not recommending vascular consultation at this time  -follow up on 2-D echo results -appreciate neuro input Active Problems:  Hypertension  -Continue current Cozaar, monitor blood pressures and further treat accordingly allowing for permissive hypertension. Holding off HCTZ for now given mild hyponatremia noted initially  Sleep apnea  - CPAP per RT  Chronic back pain  -Continue pain management  BPH (benign prostatic hyperplasia)  - continue outpatient medications  Recent left total knee replacement     Code Status:full Family Communication:  Directly with pt at bedside Disposition Plan: to home when medically stable   Consultants:  neuro  Procedures:  Echo result pending  Antibiotics:  none  HPI/Subjective: Still with L. Upper lip numbness, denies any new c/o  Objective: Filed Vitals:   05/04/12 0221 05/04/12 0421 05/04/12 0621 05/04/12 1010  BP: 129/61 132/62 136/72 144/75  Pulse: 86 81 69 65  Temp: 97.7 F (36.5 C) 98.7 F (37.1 C) 97.7 F (36.5 C) 97.8 F (36.6 C)  TempSrc: Oral Oral Oral Oral  Resp: 20 20 18 18   Height: 5\' 7"  (1.702 m)     Weight: 113 kg (249 lb 1.9 oz)     SpO2: 99% 100% 97% 95%    Intake/Output Summary (Last 24 hours) at 05/04/12 1320 Last data filed at 05/04/12 1100  Gross per 24 hour  Intake    240 ml  Output   1900 ml  Net  -1660 ml   Filed Weights   05/04/12 0221  Weight: 113 kg (249 lb 1.9 oz)    Exam:   General:  A&O x3, in NAD  Cardiovascular: RRR, nl S1S2  Respiratory:CTAB  Abdomen: benign  Neuro:A&O x3, no facial asymmetry Strength -right upper extremity  5/5, left upper extremity 4+/5, sensory grossly intact  Data Reviewed: Basic Metabolic Panel:  Lab 05/03/12 6295 05/03/12 1613  NA 138 134*  K 4.0 3.9  CL 104 101  CO2 -- 25  GLUCOSE 114* 121*  BUN 17 17  CREATININE 0.80 0.65  CALCIUM -- 9.2  MG -- --  PHOS -- --   Liver Function Tests:  Lab 05/03/12 1613  AST 16  ALT 14  ALKPHOS 108  BILITOT 0.4  PROT 6.7  ALBUMIN 3.5   No results found for this basename: LIPASE:5,AMYLASE:5 in the last 168 hours No results found for this basename: AMMONIA:5 in the last 168 hours CBC:  Lab 05/03/12 1652 05/03/12 1613  WBC -- 8.8  NEUTROABS -- 5.9  HGB 13.9 14.0  HCT 41.0 41.8  MCV -- 89.5  PLT -- 312   Cardiac Enzymes:  Lab 05/03/12 1613  CKTOTAL --  CKMB --  CKMBINDEX --  TROPONINI <0.30   BNP (last 3 results) No results found for this basename: PROBNP:3 in the last 8760 hours CBG:  Lab 05/03/12 1634  GLUCAP 109*    Recent Results (from the past 240 hour(s))  MRSA PCR SCREENING     Status: Normal   Collection Time   05/04/12  3:00 AM      Component Value Range Status Comment   MRSA by PCR NEGATIVE  NEGATIVE Final  Studies: Dg Chest 2 View  05/03/2012  *RADIOLOGY REPORT*  Clinical Data:  Left-sided weakness and cerebral infarction.  CHEST - 2 VIEW  Comparison: 03/25/2012  Findings: The heart size and mediastinal contours are within normal limits.  Both lungs are clear.  The visualized skeletal structures are unremarkable.  IMPRESSION: No active disease.   Original Report Authenticated By: Irish Lack, M.D.    Ct Head Wo Contrast  05/03/2012  *RADIOLOGY REPORT*  Clinical Data: Left perioral numbness.  CT HEAD WITHOUT CONTRAST  Technique:  Contiguous axial images were obtained from the base of the skull through the vertex without contrast.  Comparison: Multiple exams including MRI brain from [facility] dated 04/30/2012  Findings: Prior left frontal craniotomy with underlying encephalomalacia.   Hypodensity along the posterior portion the right frontal lobe corresponds with region of subacute infarct shown on the MRI from 3 days ago.  No hemorrhagic transformation observed.  The brain stem, cerebellum, cerebral peduncles, thalami, and basal ganglia appear intact.  Periventricular and corona radiata white matter hypodensities are most compatible with chronic ischemic microvascular white matter disease.  Ventricular system unremarkable.  IMPRESSION:  1.  Hypodensity posteriorly in the right frontal lobe in the vicinity of the previously identified subacute infarct shown on MRI from [facility] 3 days ago, compatible with expected evolutionary changes. 2.  Chronic microvascular white matter disease. 3.  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 4.  No intracranial hemorrhage is observed.  I have contacted [employer] personnel to merge this exam with PACS records from several institutions.  In the meantime, his other imaging is available in [place] PACS under the name SOL ENGLERT.   Original Report Authenticated By: Gaylyn Rong, M.D.    Mr Angiogram Head Wo Contrast  05/04/2012  *RADIOLOGY REPORT*  Clinical Data:  Recent CVA with new left hand weakness.  Numbness in the left upper lip.  MRI HEAD WITHOUT AND WITH CONTRAST MRA HEAD WITHOUT CONTRAST MRA NECK WITHOUT AND WITH CONTRAST  Technique:  Multiplanar, multiecho pulse sequences of the brain and surrounding structures were obtained without and with intravenous contrast.  Angiographic images of the Circle of Willis were obtained using MRA technique without intravenous contrast. Angiographic images of the neck were obtained using MRA technique without and with intravenous contrast.  Carotid stenosis measurements (when applicable) are obtained utilizing NASCET criteria, using the distal internal carotid diameter as the denominator.  Contrast: 20mL MULTIHANCE GADOBENATE DIMEGLUMINE 529 MG/ML IV SOLN  Comparison:  CTA head and neck 04/30/2012 at  [facility]. MRI brain without contrast 04/30/2012 at [facility].  MRI HEAD  Findings:  The right posterior frontal lobe non hemorrhagic infarct has not change significantly on diffusion weighted images.  T2 and FLAIR hyperintensity associated with this infarct are stable as well. Diffusion signal on the left likely represents susceptibility associated with previously documented remote hemorrhage or calcification in the posterior to the area of encephalomalacia in the left frontal lobe.  Flow is present in the major intracranial arteries remote lacunar infarcts of the basal ganglia are stable.  Extensive white matter disease is similar to the prior exam.  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 Flow is present in the left internal carotid artery in the vertebral basilar system.  MCA branch vessels are within normal limits bilaterally.  The patient is status post bilateral lens extractions.  The paranasal sinuses and mastoid air cells are clear.  IMPRESSION:  1.  Expected evolution of  a non hemorrhagic posterior right frontal lobe MCA territory infarct. 2.  No acute intracranial abnormality. 3.  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 4.  Atrophy diffuse white matter is stable. 5.  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  MRA HEAD  Findings: The right internal carotid artery is occluded with reconstitution at the level of the ophthalmic segment.  There is some irregularity within the ophthalmic segment.  Stenosis is less than 50%.  Signal intensity is less robust than on the left.  Focal narrowing is present in the proximal right A1 segment.  The left cervical internal carotid artery is markedly tortuous without focal stenosis.  The left A1 and M1 segments are normal.  The no definite anterior communicating artery is identified on this exam although the anterior communicating  artery was present on recent CTA.  The left MCA bifurcation is within normal limits.  Segmental narrowing of distal MCA branch vessels is evident.  There is significant decrease in signal intensity within the right MCA branch vessels, suggesting decreased flow.  The vertebral arteries are codominant.  The PICA origins are visualized and within normal limits bilaterally.  The basilar artery is tortuous, but within normal limits.  Both posterior cerebral arteries originate from basilar tip.  There is some attenuation of distal PCA branch vessels bilaterally.  IMPRESSION:  1.  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 2.  Significantly diminished signal within the right MCA and ACA territories, compatible with decreased flow. 3.  Although the anterior communicating artery was patent on the recent CT, it is not well visualized, suggesting decreased flow. 4.  Moderate small vessel disease.  MRA NECK  Findings: Time-of-flight images demonstrate signal loss at the origin of the right internal carotid artery.  There is no significant flow disturbance on the left.  Flow is antegrade in the vertebral arteries bilaterally.  The anterior aspect of the arch is incompletely imaged and the origins of the great vessels cannot be properly evaluated.  A standard three-vessel arch configuration appears to be present. The vertebral arteries originate from the subclavian arteries bilaterally.  Poor signal is evident bilaterally.  Based on recent CTA, this is artifactual on the right and compatible with a stenosis on the left.  The remainder of the vertebral arteries are within normal limits bilaterally.  There is mild segmental narrowing of the distal right vertebral artery, is proximal to the vertebral basilar junction.  The right internal carotid artery is occluded approximately 5 mm from the bifurcation.  The vessels reconstituted in the cavernous carotid artery.  The left  common carotid artery is within normal limits.  The left bifurcation demonstrates no significant stenosis.  Moderate tortuosity is present in the distal left cervical ICA without a significant stenosis.  IMPRESSION:  1.  Occluded right internal carotid artery. Contrast within the more proximal cavernous segment likely still represents reconstitution at the ophthalmic segment. 2.  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 3.  Artifactual signal loss at the origin of the right vertebral artery. 4.  Marked tortuosity of the cervical left internal carotid artery without significant stenosis. 5.  Mild atherosclerotic narrowing of the distal right vertebral artery, just proximal to the vertebral basilar junction, without significant stenosis.   Original Report Authenticated By: Marin Roberts, M.D.    Mr Angiogram Neck W Wo Contrast  05/04/2012  *RADIOLOGY REPORT*  Clinical Data:  Recent CVA with new left hand weakness.  Numbness in the left upper lip.  MRI HEAD WITHOUT AND WITH CONTRAST MRA HEAD WITHOUT CONTRAST MRA NECK WITHOUT AND WITH CONTRAST  Technique:  Multiplanar, multiecho pulse sequences of the brain and surrounding structures were obtained without and with intravenous contrast.  Angiographic images of the Circle of Willis were obtained using MRA technique without intravenous contrast. Angiographic images of the neck were obtained using MRA technique without and with intravenous contrast.  Carotid stenosis measurements (when applicable) are obtained utilizing NASCET criteria, using the distal internal carotid diameter as the denominator.  Contrast: 20mL MULTIHANCE GADOBENATE DIMEGLUMINE 529 MG/ML IV SOLN  Comparison:  CTA head and neck 04/30/2012 at [facility]. MRI brain without contrast 04/30/2012 at [facility].  MRI HEAD  Findings:  The right posterior frontal lobe non hemorrhagic infarct has not change significantly on diffusion weighted images.   T2 and FLAIR hyperintensity associated with this infarct are stable as well. Diffusion signal on the left likely represents susceptibility associated with previously documented remote hemorrhage or calcification in the posterior to the area of encephalomalacia in the left frontal lobe.  Flow is present in the major intracranial arteries remote lacunar infarcts of the basal ganglia are stable.  Extensive white matter disease is similar to the prior exam.  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 Flow is present in the left internal carotid artery in the vertebral basilar system.  MCA branch vessels are within normal limits bilaterally.  The patient is status post bilateral lens extractions.  The paranasal sinuses and mastoid air cells are clear.  IMPRESSION:  1.  Expected evolution of a non hemorrhagic posterior right frontal lobe MCA territory infarct. 2.  No acute intracranial abnormality. 3.  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 4.  Atrophy diffuse white matter is stable. 5.  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  MRA HEAD  Findings: The right internal carotid artery is occluded with reconstitution at the level of the ophthalmic segment.  There is some irregularity within the ophthalmic segment.  Stenosis is less than 50%.  Signal intensity is less robust than on the left.  Focal narrowing is present in the proximal right A1 segment.  The left cervical internal carotid artery is markedly tortuous without focal stenosis.  The left A1 and M1 segments are normal.  The no definite anterior communicating artery is identified on this exam although the anterior communicating artery was present on recent CTA.  The left MCA bifurcation is within normal limits.  Segmental narrowing of distal MCA branch vessels is evident.  There is significant decrease in signal intensity within the right MCA  branch vessels, suggesting decreased flow.  The vertebral arteries are codominant.  The PICA origins are visualized and within normal limits bilaterally.  The basilar artery is tortuous, but within normal limits.  Both posterior cerebral arteries originate from basilar tip.  There is some attenuation of distal PCA branch vessels bilaterally.  IMPRESSION:  1.  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 2.  Significantly diminished signal within the right MCA and ACA territories, compatible with decreased flow. 3.  Although the anterior communicating artery was patent on the recent CT, it is not well visualized, suggesting decreased flow. 4.  Moderate small vessel disease.  MRA NECK  Findings: Time-of-flight images demonstrate signal loss at the  origin of the right internal carotid artery.  There is no significant flow disturbance on the left.  Flow is antegrade in the vertebral arteries bilaterally.  The anterior aspect of the arch is incompletely imaged and the origins of the great vessels cannot be properly evaluated.  A standard three-vessel arch configuration appears to be present. The vertebral arteries originate from the subclavian arteries bilaterally.  Poor signal is evident bilaterally.  Based on recent CTA, this is artifactual on the right and compatible with a stenosis on the left.  The remainder of the vertebral arteries are within normal limits bilaterally.  There is mild segmental narrowing of the distal right vertebral artery, is proximal to the vertebral basilar junction.  The right internal carotid artery is occluded approximately 5 mm from the bifurcation.  The vessels reconstituted in the cavernous carotid artery.  The left common carotid artery is within normal limits.  The left bifurcation demonstrates no significant stenosis.  Moderate tortuosity is present in the distal left cervical ICA without a significant stenosis.  IMPRESSION:  1.   Occluded right internal carotid artery. Contrast within the more proximal cavernous segment likely still represents reconstitution at the ophthalmic segment. 2.  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 3.  Artifactual signal loss at the origin of the right vertebral artery. 4.  Marked tortuosity of the cervical left internal carotid artery without significant stenosis. 5.  Mild atherosclerotic narrowing of the distal right vertebral artery, just proximal to the vertebral basilar junction, without significant stenosis.   Original Report Authenticated By: Marin Roberts, M.D.    Mr Laqueta Jean Wo Contrast  05/04/2012  *RADIOLOGY REPORT*  Clinical Data:  Recent CVA with new left hand weakness.  Numbness in the left upper lip.  MRI HEAD WITHOUT AND WITH CONTRAST MRA HEAD WITHOUT CONTRAST MRA NECK WITHOUT AND WITH CONTRAST  Technique:  Multiplanar, multiecho pulse sequences of the brain and surrounding structures were obtained without and with intravenous contrast.  Angiographic images of the Circle of Willis were obtained using MRA technique without intravenous contrast. Angiographic images of the neck were obtained using MRA technique without and with intravenous contrast.  Carotid stenosis measurements (when applicable) are obtained utilizing NASCET criteria, using the distal internal carotid diameter as the denominator.  Contrast: 20mL MULTIHANCE GADOBENATE DIMEGLUMINE 529 MG/ML IV SOLN  Comparison:  CTA head and neck 04/30/2012 at [facility]. MRI brain without contrast 04/30/2012 at [facility].  MRI HEAD  Findings:  The right posterior frontal lobe non hemorrhagic infarct has not change significantly on diffusion weighted images.  T2 and FLAIR hyperintensity associated with this infarct are stable as well. Diffusion signal on the left likely represents susceptibility associated with previously documented remote hemorrhage or calcification in the posterior  to the area of encephalomalacia in the left frontal lobe.  Flow is present in the major intracranial arteries remote lacunar infarcts of the basal ganglia are stable.  Extensive white matter disease is similar to the prior exam.  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 Flow is present in the left internal carotid artery in the vertebral basilar system.  MCA branch vessels are within normal limits bilaterally.  The patient is status post bilateral lens extractions.  The paranasal sinuses and mastoid air cells are clear.  IMPRESSION:  1.  Expected evolution of a non hemorrhagic posterior right frontal lobe MCA territory infarct. 2.  No acute intracranial abnormality. 3.  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 4.  Atrophy diffuse white matter is stable. 5.  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  MRA HEAD  Findings: The right internal carotid artery is occluded with reconstitution at the level of the ophthalmic segment.  There is some irregularity within the ophthalmic segment.  Stenosis is less than 50%.  Signal intensity is less robust than on the left.  Focal narrowing is present in the proximal right A1 segment.  The left cervical internal carotid artery is markedly tortuous without focal stenosis.  The left A1 and M1 segments are normal.  The no definite anterior communicating artery is identified on this exam although the anterior communicating artery was present on recent CTA.  The left MCA bifurcation is within normal limits.  Segmental narrowing of distal MCA branch vessels is evident.  There is significant decrease in signal intensity within the right MCA branch vessels, suggesting decreased flow.  The vertebral arteries are codominant.  The PICA origins are visualized and within normal limits bilaterally.  The basilar artery is tortuous, but within normal limits.  Both posterior  cerebral arteries originate from basilar tip.  There is some attenuation of distal PCA branch vessels bilaterally.  IMPRESSION:  1.  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 2.  Significantly diminished signal within the right MCA and ACA territories, compatible with decreased flow. 3.  Although the anterior communicating artery was patent on the recent CT, it is not well visualized, suggesting decreased flow. 4.  Moderate small vessel disease.  MRA NECK  Findings: Time-of-flight images demonstrate signal loss at the origin of the right internal carotid artery.  There is no significant flow disturbance on the left.  Flow is antegrade in the vertebral arteries bilaterally.  The anterior aspect of the arch is incompletely imaged and the origins of the great vessels cannot be properly evaluated.  A standard three-vessel arch configuration appears to be present. The vertebral arteries originate from the subclavian arteries bilaterally.  Poor signal is evident bilaterally.  Based on recent CTA, this is artifactual on the right and compatible with a stenosis on the left.  The remainder of the vertebral arteries are within normal limits bilaterally.  There is mild segmental narrowing of the distal right vertebral artery, is proximal to the vertebral basilar junction.  The right internal carotid artery is occluded approximately 5 mm from the bifurcation.  The vessels reconstituted in the cavernous carotid artery.  The left common carotid artery is within normal limits.  The left bifurcation demonstrates no significant stenosis.  Moderate tortuosity is present in the distal left cervical ICA without a significant stenosis.  IMPRESSION:  1.  Occluded right internal carotid artery. Contrast within the more proximal cavernous segment likely still represents reconstitution at the ophthalmic segment. 2.  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 3.  Artifactual signal loss at the origin of the right vertebral artery. 4.  Marked tortuosity of the cervical left internal carotid artery without significant stenosis. 5.  Mild atherosclerotic narrowing of the distal right vertebral artery, just proximal to the vertebral basilar junction, without significant stenosis.   Original Report Authenticated By: Marin Roberts, M.D.     Scheduled Meds:   . aspirin  81 mg Oral Daily  . B-complex with vitamin C  1 tablet Oral Daily  . clopidogrel  75 mg Oral Q breakfast  .  docusate sodium  100 mg Oral BID  . enoxaparin  40 mg Subcutaneous Q24H  . finasteride  5 mg Oral Daily  . multivitamin with minerals  1 tablet Oral Daily  . omega-3 acid ethyl esters  2 g Oral BID  . Tamsulosin HCl  0.4 mg Oral QPC supper  . vitamin C  500 mg Oral Daily  . Vitamin D (Ergocalciferol)  50,000 Units Oral Q7 days  . [DISCONTINUED] aspirin  81 mg Oral Daily  . [DISCONTINUED] clopidogrel  75 mg Oral Daily  . [DISCONTINUED] losartan  50 mg Oral Daily   Continuous Infusions:   . sodium chloride 50 mL/hr at 05/03/12 2352    Principal Problem:  *CVA (cerebral infarction) Active Problems:  Hypertension  Sleep apnea  Chronic back pain  BPH (benign prostatic hyperplasia)    Time spent:27mins    Kela Millin  [facility] Pager [phone] If 8PM-8AM, please contact night-coverage at [email or web address], password [facility] 05/04/2012, 1:20 PM  LOS: 1 day

## 2012-05-04 NOTE — Progress Notes (Signed)
Pt refusing to follow the safety plan and is refusing for the bed alarm to be set. Pt was explained why we use the bed alarms, and he started getting agitated, and cussing at staff. The Pt's bed alarm turned off. Will cont to monitor.

## 2012-05-04 NOTE — Progress Notes (Signed)
Placed patient on CPAP at 10cm. Sp02=97%

## 2012-05-04 NOTE — Progress Notes (Signed)
Kyle Lewis is a 66 y.o right handed. male who presented to [facility] last week with transient left sided numbness. He was arranged for a TIA workup and his CTA showed carotid occlusion, and his MRI showed an acute infarct in the right hemisphere.  He had history of left frontal meningioma, s/p resection in 1991, presented with expressive aphasia then. 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 Subjective: Friend at bedside, he only has mild left finger tips and left upper lip numbness.  Past Medical History   Diagnosis  Date   .  Diverticulosis    .  Adenomatous colon polyp    .  Sleep apnea      wears CPAP   .  Allergy      seasonal   .  Arthritis    .  Cataract    .  Hypertension    .  Nocturia    .  BPH (benign prostatic hyperplasia)    .  Adrenal mass    .  Chronic back pain    .  CVA (cerebral infarction)     Family History:  Several family members with stroke  Social History:  Tob: +smoker, trying to quit  Exam:  Current vital signs:  BP 122/93 Pulse 68  Temp 97.1 F (36.2 C) (Oral)  Resp 18  SpO2 100%    CV: RRR, obese, Mental Status:  Patient is awake, alert, oriented to person, place, month, year, and situation.   Patient is able to give a clear and coherent history.  Cranial Nerves:  II: Visual Fields are full. Pupils are equal, round, and reactive to light. Discs are difficult to visualize.  III,IV, VI: EOMI without ptosis or diploplia.  V: Facial sensation is symmetric to temperature  VII: Facial movement is symmetric.  VIII: hearing is intact to voice  X: Uvula elevates symmetrically  XI: Shoulder shrug is symmetric.  XII: tongue is midline without atrophy or fasciculations.  Motor:  Tone is normal. Bulk is normal. 5/5 strength was present in all four extremities with the exception of mild APB and grip weakness on the right.  Sensory:  Sensation is  symmetric to light touch and temperature in the arms and legs.  Deep Tendon Reflexes:  2+ and symmetric in the biceps and patellae.  Plantars:  Toes are downgoing bilaterally.  Cerebellar:  FNF intact bilaterally  Gait: deferred.  MRI brain:Expected evolution of a non hemorrhagic posterior right frontal  lobe MCA territory infarct.  No acute intracranial abnormality. 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  1. Continue to address vascular risk factor. 2. He was on ASA, now switch to plavix. 3. Less likely surgical or interventional candidate. 4. Follow up with [facility] in 1-2 months post discharge.

## 2012-05-04 NOTE — Evaluation (Signed)
Physical Therapy Evaluation Patient Details Name: Kyle Lewis MRN: [id number] DOB: [birth year] Today's Date: 05/04/2012 Time: 4098-1191 PT Time Calculation (min): 14 min  PT Assessment / Plan / Recommendation Clinical Impression  Patient is a 66 yo male admitted with CVA with lt. sided weakness. Patient will benefit from acute PT to maximize independence prior to discharge home with wife.      PT Assessment  Patient needs continued PT services    Follow Up Recommendations  Home health PT;Supervision - Intermittent    Does the patient have the potential to tolerate intense rehabilitation      Barriers to Discharge Decreased caregiver support      Equipment Recommendations  None recommended by PT    Recommendations for Other Services     Frequency Min 4X/week    Precautions / Restrictions Precautions Precautions: Fall Restrictions Weight Bearing Restrictions: No   Pertinent Vitals/Pain       Mobility  Bed Mobility Bed Mobility: Supine to Sit;Sit to Supine Supine to Sit: 7: Independent;HOB flat Sit to Supine: 7: Independent;HOB flat Details for Bed Mobility Assistance: No cues or assist needed Transfers Transfers: Sit to Stand;Stand to Sit Sit to Stand: 4: Min guard;With upper extremity assist;From bed Stand to Sit: 4: Min guard;With upper extremity assist;To bed Details for Transfer Assistance: Verbal cues for hand placement.  Assist for safety only Ambulation/Gait Ambulation/Gait Assistance: 4: Min guard Ambulation Distance (Feet): 94 Feet Assistive device: Straight cane Ambulation/Gait Assistance Details: Verbal cues to stand tall and move at safe speed.  Patient with antalgic gait - h/o Lt. TKA. Gait Pattern: Step-through pattern;Decreased stance time - left;Decreased step length - right;Antalgic;Trunk flexed Gait velocity: slow gait speed Stairs: No Modified Rankin (Stroke Patients Only) Pre-Morbid Rankin Score: No significant disability Modified Rankin:  Moderate disability           PT Diagnosis: Difficulty walking;Abnormality of gait  PT Problem List: Decreased strength;Decreased activity tolerance;Decreased balance;Decreased mobility;Decreased knowledge of use of DME;Obesity PT Treatment Interventions: DME instruction;Gait training;Stair training;Balance training;Functional mobility training;Patient/family education   PT Goals Acute Rehab PT Goals PT Goal Formulation: With patient Time For Goal Achievement: 05/11/12 Potential to Achieve Goals: Good Pt will go Sit to Stand: with modified independence;with upper extremity assist PT Goal: Sit to Stand - Progress: Goal set today Pt will Ambulate: >150 feet;with modified independence;with least restrictive assistive device PT Goal: Ambulate - Progress: Goal set today Pt will Go Up / Down Stairs: 3-5 stairs;with supervision;with rail(s);with least restrictive assistive device PT Goal: Up/Down Stairs - Progress: Goal set today Additional Goals Additional Goal #1: Patient will score > 19 on DGI balance assessment to indicate low fall risk. PT Goal: Additional Goal #1 - Progress: Goal set today  Visit Information  Last PT Received On: 05/04/12 Assistance Needed: +1    Subjective Data  Subjective: "I'm walking about like I usually do" Patient Stated Goal: To go home soon   Prior Functioning  Home Living Lives With: Spouse Available Help at Discharge: Family;Available PRN/intermittently (Wife works 6 hrs/day, 5 days/week) Type of Home: House Home Access: Stairs to enter [employer] of Steps: 7 Entrance Stairs-Rails: Right;Left Home Layout: One level Bathroom Shower/Tub: Health visitor: Standard Bathroom Accessibility: Yes How Accessible: Accessible via walker Home Adaptive Equipment: Walker - rolling;Straight cane;Shower chair with back Prior Function Level of Independence: Independent Able to Take Stairs?: Yes Driving: Yes 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, 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 Dominant Hand: Right  Cognition  Overall Cognitive Status: Appears within functional limits for tasks assessed/performed Arousal/Alertness: Awake/alert Orientation Level: Oriented X4 / Intact Behavior During Session: [facility] for tasks performed    Extremity/Trunk Assessment Right Upper Extremity Assessment RUE ROM/Strength/Tone: WFL for tasks assessed RUE Sensation: WFL - Light Touch (Fingertips feel numb - chronic) Left Upper Extremity Assessment LUE ROM/Strength/Tone: WFL for tasks assessed LUE Sensation: WFL - Light Touch (Fingertips feel numb - new) Right Lower Extremity Assessment RLE ROM/Strength/Tone: Within functional levels RLE Sensation: WFL - Light Touch RLE Coordination: WFL - gross motor Left Lower Extremity Assessment LLE ROM/Strength/Tone: Deficits LLE ROM/Strength/Tone Deficits: Strength 4+/5 - TKA impacting strength/function LLE Sensation: WFL - Light Touch (Except feels "numb" around knee) LLE Coordination: WFL - gross motor   Balance    End of Session PT - End of Session Equipment Utilized During Treatment: Gait belt Activity Tolerance: Patient tolerated treatment well Patient left: in bed;with call bell/phone within reach;with bed alarm set Nurse Communication: Mobility status  GP     Vena Austria 05/04/2012, 9:53 AM Durenda Hurt. Renaldo Fiddler, [facility] Acute Rehab Services Pager [phone]

## 2012-05-04 NOTE — Progress Notes (Signed)
  Echocardiogram 2D Echocardiogram has been performed.  Karee Christopherson FRANCES 05/04/2012, 12:35 PM

## 2012-05-04 NOTE — Evaluation (Signed)
Occupational Therapy Evaluation Patient Details Name: Kyle Lewis MRN: [id number] DOB: November 20, 1945 Today's Date: 05/04/2012 Time: 9147-8295 OT Time Calculation (min): 18 min  OT Assessment / Plan / Recommendation Clinical Impression  This 66 yo male admitted with right side of tongue numbness and left UE weakness last week at [facility] and new symptom now of left lip numbness presents to acute OT with problems below. Will benefit from acute OT without need for follow up.    OT Assessment  Patient needs continued OT Services    Follow Up Recommendations  No OT follow up    Barriers to Discharge None    Equipment Recommendations  None recommended by PT;None recommended by OT       Frequency  Min 2X/week    Precautions / Restrictions Precautions Precautions: Fall Restrictions Weight Bearing Restrictions: No   Pertinent Vitals/Pain Left knee due to recent knee surgery (Oct)    ADL  Eating/Feeding: Simulated;Independent Where Assessed - Eating/Feeding: Edge of bed Grooming: Simulated;Independent Where Assessed - Grooming: Unsupported standing Upper Body Bathing: Simulated;Independent Where Assessed - Upper Body Bathing: Unsupported sitting Lower Body Bathing: Simulated;Minimal assistance (left foot due to recent knee surgery) Where Assessed - Lower Body Bathing: Unsupported sit to stand Upper Body Dressing: Simulated;Independent Where Assessed - Upper Body Dressing: Unsupported sitting Lower Body Dressing: Performed;Minimal assistance (left sock due to recent knee surgery) Where Assessed - Lower Body Dressing: Unsupported sit to stand Toilet Transfer: Performed;Modified independent Toilet Transfer Method: Sit to Barista: Comfort height toilet;Grab bars Toileting - Clothing Manipulation and Hygiene: Simulated;Independent Where Assessed - Toileting Clothing Manipulation and Hygiene: Standing Transfers/Ambulation Related to ADLs: Independent  sit to stand and stand to sit, Mod I with ambulation with SPC    OT Diagnosis:  (FM coordination)  OT Problem List: Decreased coordination OT Treatment Interventions: Therapeutic activities;Therapeutic exercise   OT Goals Acute Rehab OT Goals OT Goal Formulation: With patient Time For Goal Achievement: 05/05/12 Potential to Achieve Goals: Good Arm Goals Additional Arm Goal #1: Pt wil be independent with LUE FM activities. Arm Goal: Additional Goal #1 - Progress: Goal set today  Visit Information  Last OT Received On: 05/04/12 Assistance Needed: +1    Subjective Data  Subjective: My left knee hurts (knee replacement Oct 2013) Patient Stated Goal: Did not ask   Prior Functioning     Home Living Lives With: Spouse Available Help at Discharge: Family;Available PRN/intermittently (wife works 6hrs/day 5 days/wk) Type of Home: House Home Access: Stairs to enter [employer] of Steps: 7 Entrance Stairs-Rails: Right;Left Home Layout: One level Bathroom Shower/Tub: Walk-in shower;Door [employer] Toilet: Standard Bathroom Accessibility: Yes How Accessible: Accessible via walker Home Adaptive Equipment: Walker - rolling;Straight cane;Shower chair with back Prior Function Level of Independence: Independent Able to Take Stairs?: Yes Driving: Yes 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, 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 Dominant Hand: Right            Cognition  Overall Cognitive Status: Appears within functional limits for tasks assessed/performed Arousal/Alertness: Awake/alert Orientation Level: Appears intact for tasks assessed Behavior During Session: [facility] for tasks performed    Extremity/Trunk Assessment Right Upper Extremity Assessment RUE ROM/Strength/Tone: Within functional levels RUE Sensation: WFL - Light Touch (finger tips feel numb-chronic) Left Upper Extremity Assessment LUE ROM/Strength/Tone: Within functional  levels LUE Sensation: Deficits LUE Sensation Deficits: finger tips feel numb--new, reports feels a little different than the right hand, arms feel the same. LUE Coordination: Deficits LUE Coordination Deficits:  FM mildly off     Mobility Bed Mobility Bed Mobility: Supine to Sit Supine to Sit: 7: Independent;HOB flat Transfers Transfers: Sit to Stand;Stand to Sit Sit to Stand: 7: Independent;With upper extremity assist;From bed Stand to Sit: 7: Independent;With upper extremity assist;To bed        Exercise Other Exercises Other Exercises: Went over some general activities he could do with his LUE (hand)---manipulation of keys, dealing/flipping card, putting together/ taking apart nuts and bolts, memory game with grand-daughter so she can flip over the pieces. Will have OT tomorrow follow up with handout for him.      End of Session OT - End of Session Equipment Utilized During Treatment:  [facility]) Activity Tolerance: Patient tolerated treatment well Patient left: in bed;with family/visitor present (sitting EOb) Nurse Communication:  (Skin tear on left forearm)       Evette Georges [phone] 05/04/2012, 3:11 PM

## 2012-05-05 LAB — BASIC METABOLIC PANEL
BUN: 10 mg/dL (ref 6–23)
CO2: 29 mEq/L (ref 19–32)
Calcium: 9.1 mg/dL (ref 8.4–10.5)
Chloride: 104 mEq/L (ref 96–112)
Creatinine, Ser: 0.48 mg/dL — ABNORMAL LOW (ref 0.50–1.35)
GFR calc Af Amer: >90 mL/min (ref >90)
GFR calc non Af Amer: >90 mL/min (ref >90)
Glucose, Bld: 111 mg/dL — ABNORMAL HIGH (ref 70–99)
Potassium: 3.8 mEq/L (ref 3.5–5.1)
Sodium: 141 mEq/L (ref 135–145)

## 2012-05-05 NOTE — Discharge Summary (Signed)
Physician Discharge Summary  Kyle Lewis ZOX:[id number] DOB: 1945-08-06 DOA: 05/03/2012  PCP: Kyle Lewis  Admit date: 05/03/2012 Discharge date: 05/05/2012  Time spent: <30 minutes  Recommendations for Outpatient Follow-up:       Follow-up Information    Follow up with Kyle Lewis. (in 1week, call for appt upon discharge)    Contact information:   [address] [place] [address] [phone]       Follow up with Kyle Lewis. (in 1-58mos, call for appt upon discharge)    Contact information:   [address] [place] [address] [phone]        Follow up outpatient with vascular surgery as previously directed Follow up for outpatient physical therapy as previously directed per orthopedics Followup with orthopedics outpatient as previously directed.  Discharge Diagnoses:  Principal Problem:  *CVA (cerebral infarction) Active Problems:  Hypertension  Sleep apnea  Chronic back pain  BPH (benign prostatic hyperplasia)  mild aortic valve stenosis-follow up outpatient with PCP  Discharge Condition: Improved/stable  Diet recommendation: Heart healthy diet  Filed Weights   05/04/12 0221  Weight: 113 kg (249 lb 1.9 oz)    History of present illness:  Kyle Lewis is a 66 y.o. male with history of recent CVA (right frontal lobe) diagnosed at [facility] hospital last week who presents with above complaints. He states that about 9 days ago he had some strokelike symptoms and-numbness on the right side of his tongue with left upper extremity weakness and so went to [facility] the next day and he was placed on Plavix and instructed to continue taking that along with his aspirin. He was then discharged home on as to followup for imaging studies this week. 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 MRI of brain on 04/30/12  showed right frontal subacute stroke. 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 He reports he was set up to see a vascular surgeon on 12/13. Today he began having new left upper lip numbness, the left forearm or weakness was about the same as had been-he came to the ED. He denies blurry vision, dysphagia, slurred speech chest pain fevers and no cough. He was seen in the ED and a CT scan of his head showed a subacute infarct consistent with the one seen on MRI on 1126. An MRI was ordered to Neurology was consulted on admission to the hospitalist service recommended further evaluation and management.   Hospital Course:  Principal Problem:  *CVA (cerebral infarction) As discussed above, upon admission neurology was consulted and MRI MRA of head and neck were ordered as well as echocardiogram, and neurology recommended discontinuing the aspirin and keeping patient on Plavix only since patient stated dose no cardiac reason for for which he had been placed on aspirin.  - MRI Reveals recent CVA, no acute findings, and MRA with occlusion R. ICA Which is non surgical and so neuro not recommending vascular consultation at this time  -2-D echo was done no wall motion abnormalities reported systolic function normal mild aortic valve stenosis reported as valve area of 1.28, and calcified mitral annulus reported,no report of embolic source  -Discussed patient with neuro and from their standpoint okay to discharge home, and his to follow up with outpatient  neurology in one to 2 months Active Problems:  Hypertension  -She was maintained on Cozaar, and blood pressures monitored and further treat accordingly allowing for permissive hypertension while in the hospital. He is to resume his preadmission medications upon discharge and followup with his primary care physician for continued monitoring and adjustment of his  medications as appropriate for optimal blood pressure control. Sleep apnea  - CPAP per RT was ordered in the hospital Chronic back pain  -Continue pain management  BPH (benign prostatic hyperplasia)  - continue outpatient medications  Recent left total knee replacement  -His to followup outpatient with orthopedics.  Procedures: Study Conclusions  - Left ventricle: The cavity size was normal. Systolic function was normal. Wall motion was normal; there were no regional wall motion abnormalities. - Aortic valve: There was mild stenosis. Valve area: 1.46cm^2(VTI). Valve area: 1.28cm^2 (Vmax). - Mitral valve: Calcified annulus. Recommendations: Consider transesophageal echocardiography if clinically indicated.   Consultations:  Neurology  Discharge Exam: Filed Vitals:   05/04/12 2200 05/05/12 0200 05/05/12 0600 05/05/12 1014  BP: 140/75 131/68 137/63 127/65  Pulse: 68 77 88 73  Temp: 97.9 F (36.6 C) 97.7 F (36.5 C) 97.8 F (36.6 C) 98.3 F (36.8 C)  TempSrc:    Oral  Resp: 20 20 20 20   Height:      Weight:      SpO2: 95% 100% 97% 97%    Exam:  General: A&O x3, in NAD  Cardiovascular: RRR, nl S1S2  Respiratory:CTAB  Abdomen: benign  Neuro:A&O x3, no facial asymmetry Strength -right upper extremity 5/5, left upper extremity 4+/5, sensory grossly intact     Discharge Instructions  Discharge Orders    Future Appointments: Provider: Department: Dept Phone: Center:   05/23/2012 11:00 AM [facility] [phone] [facility]   05/23/2012 12:30 PM Kyle Kerns, [facility] [phone] [facility]     Future Orders Please Complete By Expires   Diet - low sodium heart healthy      Increase activity slowly          Medication List     As of 05/05/2012  1:56 PM    STOP taking these medications         aspirin 81 MG chewable tablet      rivaroxaban 10 MG Tabs tablet   Commonly known as: XARELTO       TAKE these medications         B-complex with vitamin C tablet   Take 1 tablet by mouth daily.      clopidogrel 75 MG tablet   Commonly known as: PLAVIX   Take 75 mg by mouth daily.      DSS 100 MG Caps   Take 100 mg by mouth 2 (two) times daily.      finasteride 5 MG tablet   Commonly known as: PROSCAR   Take 5 mg by mouth daily.      losartan-hydrochlorothiazide 100-25 MG per tablet   Commonly known as: HYZAAR   Take 1 tablet by mouth every morning.      meloxicam 15 MG tablet   Commonly known as: MOBIC   Take 15 mg by mouth daily.      multivitamin with minerals Tabs   Take 1 tablet by mouth daily.      omega-3 acid ethyl esters 1 G capsule   Commonly known as: LOVAZA   Take 2 g by mouth 2 (two) times  daily.      oxyCODONE 5 MG immediate release tablet   Commonly known as: Oxy IR/ROXICODONE   Take 1-4 tablets (5-20 mg total) by mouth every 3 (three) hours as needed.      polyethylene glycol packet   Commonly known as: MIRALAX / GLYCOLAX   Take 17 g by mouth daily as needed.      Tamsulosin HCl 0.4 MG Caps   Commonly known as: FLOMAX   Take 0.4 mg by mouth daily after supper.      traMADol 50 MG tablet   Commonly known as: ULTRAM   Take 1-2 tablets (50-100 mg total) by mouth every 6 (six) hours as needed.      vitamin C 500 MG tablet   Commonly known as: ASCORBIC ACID   Take 500 mg by mouth daily.      Vitamin D (Ergocalciferol) 50000 UNITS Caps   Commonly known as: DRISDOL   Take 50,000 Units by mouth every 7 (seven) days.           Follow-up Information    Follow up with Kyle Lewis. (in 1week, call for appt upon discharge)    Contact information:   [address] [place] [address] [phone]       Follow up with Kyle Lewis. (in 1-54mos, call for appt upon discharge)    Contact information:   [address] [place] [address] [phone]           The results of significant diagnostics from  this hospitalization (including imaging, microbiology, ancillary and laboratory) are listed below for reference.    Significant Diagnostic Studies: Dg Chest 2 View  05/03/2012  *RADIOLOGY REPORT*  Clinical Data:  Left-sided weakness and cerebral infarction.  CHEST - 2 VIEW  Comparison: 03/25/2012  Findings: The heart size and mediastinal contours are within normal limits.  Both lungs are clear.  The visualized skeletal structures are unremarkable.  IMPRESSION: No active disease.   Original Report Authenticated By: Irish Lack, M.D.    Ct Head Wo Contrast  05/03/2012  *RADIOLOGY REPORT*  Clinical Data: Left perioral numbness.  CT HEAD WITHOUT CONTRAST  Technique:  Contiguous axial images were obtained from the base of the skull through the vertex without contrast.  Comparison: Multiple exams including MRI brain from [facility] dated 04/30/2012  Findings: Prior left frontal craniotomy with underlying encephalomalacia.  Hypodensity along the posterior portion the right frontal lobe corresponds with region of subacute infarct shown on the MRI from 3 days ago.  No hemorrhagic transformation observed.  The brain stem, cerebellum, cerebral peduncles, thalami, and basal ganglia appear intact.  Periventricular and corona radiata white matter hypodensities are most compatible with chronic ischemic microvascular white matter disease.  Ventricular system unremarkable.  IMPRESSION:  1.  Hypodensity posteriorly in the right frontal lobe in the vicinity of the previously identified subacute infarct shown on MRI from [facility] 3 days ago, compatible with expected evolutionary changes. 2.  Chronic microvascular white matter disease. 3.  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 4.  No intracranial hemorrhage is observed.  I have contacted [employer] [employer] personnel to merge this exam with PACS records from several institutions.  In the meantime, his other imaging is available in [place] PACS under the name JERAD DUNLAP.   Original Report Authenticated By: Gaylyn Rong, M.D.    Mr Angiogram Head Wo Contrast  05/04/2012  *RADIOLOGY REPORT*  Clinical Data:  Recent CVA with new left hand  weakness.  Numbness in the left upper lip.  MRI HEAD WITHOUT AND WITH CONTRAST MRA HEAD WITHOUT CONTRAST MRA NECK WITHOUT AND WITH CONTRAST  Technique:  Multiplanar, multiecho pulse sequences of the brain and surrounding structures were obtained without and with intravenous contrast.  Angiographic images of the Circle of Willis were obtained using MRA technique without intravenous contrast. Angiographic images of the neck were obtained using MRA technique without and with intravenous contrast.  Carotid stenosis measurements (when applicable) are obtained utilizing NASCET criteria, using the distal internal carotid diameter as the denominator.  Contrast: 20mL MULTIHANCE GADOBENATE DIMEGLUMINE 529 MG/ML IV SOLN  Comparison:  CTA head and neck 04/30/2012 at [facility]. MRI brain without contrast 04/30/2012 at [facility].  MRI HEAD  Findings:  The right posterior frontal lobe non hemorrhagic infarct has not change significantly on diffusion weighted images.  T2 and FLAIR hyperintensity associated with this infarct are stable as well. Diffusion signal on the left likely represents susceptibility associated with previously documented remote hemorrhage or calcification in the posterior to the area of encephalomalacia in the left frontal lobe.  Flow is present in the major intracranial arteries remote lacunar infarcts of the basal ganglia are stable.  Extensive white matter disease is similar to the prior exam.  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 Flow is present in the left internal carotid artery in the vertebral basilar system.  MCA branch vessels are within normal limits bilaterally.  The patient is status post bilateral lens extractions.  The paranasal sinuses and  mastoid air cells are clear.  IMPRESSION:  1.  Expected evolution of a non hemorrhagic posterior right frontal lobe MCA territory infarct. 2.  No acute intracranial abnormality. 3.  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 4.  Atrophy diffuse white matter is stable. 5.  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  MRA HEAD  Findings: The right internal carotid artery is occluded with reconstitution at the level of the ophthalmic segment.  There is some irregularity within the ophthalmic segment.  Stenosis is less than 50%.  Signal intensity is less robust than on the left.  Focal narrowing is present in the proximal right A1 segment.  The left cervical internal carotid artery is markedly tortuous without focal stenosis.  The left A1 and M1 segments are normal.  The no definite anterior communicating artery is identified on this exam although the anterior communicating artery was present on recent CTA.  The left MCA bifurcation is within normal limits.  Segmental narrowing of distal MCA branch vessels is evident.  There is significant decrease in signal intensity within the right MCA branch vessels, suggesting decreased flow.  The vertebral arteries are codominant.  The PICA origins are visualized and within normal limits bilaterally.  The basilar artery is tortuous, but within normal limits.  Both posterior cerebral arteries originate from basilar tip.  There is some attenuation of distal PCA branch vessels bilaterally.  IMPRESSION:  1.  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 2.  Significantly diminished signal within the right MCA and ACA territories, compatible with decreased flow. 3.  Although the anterior communicating artery was patent on the recent CT, it is not well visualized, suggesting decreased flow. 4.  Moderate small vessel disease.  MRA NECK  Findings: Time-of-flight images  demonstrate signal loss at the origin of the right internal carotid  artery.  There is no significant flow disturbance on the left.  Flow is antegrade in the vertebral arteries bilaterally.  The anterior aspect of the arch is incompletely imaged and the origins of the great vessels cannot be properly evaluated.  A standard three-vessel arch configuration appears to be present. The vertebral arteries originate from the subclavian arteries bilaterally.  Poor signal is evident bilaterally.  Based on recent CTA, this is artifactual on the right and compatible with a stenosis on the left.  The remainder of the vertebral arteries are within normal limits bilaterally.  There is mild segmental narrowing of the distal right vertebral artery, is proximal to the vertebral basilar junction.  The right internal carotid artery is occluded approximately 5 mm from the bifurcation.  The vessels reconstituted in the cavernous carotid artery.  The left common carotid artery is within normal limits.  The left bifurcation demonstrates no significant stenosis.  Moderate tortuosity is present in the distal left cervical ICA without a significant stenosis.  IMPRESSION:  1.  Occluded right internal carotid artery. Contrast within the more proximal cavernous segment likely still represents reconstitution at the ophthalmic segment. 2.  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 3.  Artifactual signal loss at the origin of the right vertebral artery. 4.  Marked tortuosity of the cervical left internal carotid artery without significant stenosis. 5.  Mild atherosclerotic narrowing of the distal right vertebral artery, just proximal to the vertebral basilar junction, without significant stenosis.   Original Report Authenticated By: Marin Roberts, M.D.    Mr Angiogram Neck W Wo Contrast  05/04/2012  *RADIOLOGY REPORT*  Clinical Data:  Recent CVA with new left hand weakness.  Numbness in the left  upper lip.  MRI HEAD WITHOUT AND WITH CONTRAST MRA HEAD WITHOUT CONTRAST MRA NECK WITHOUT AND WITH CONTRAST  Technique:  Multiplanar, multiecho pulse sequences of the brain and surrounding structures were obtained without and with intravenous contrast.  Angiographic images of the Circle of Willis were obtained using MRA technique without intravenous contrast. Angiographic images of the neck were obtained using MRA technique without and with intravenous contrast.  Carotid stenosis measurements (when applicable) are obtained utilizing NASCET criteria, using the distal internal carotid diameter as the denominator.  Contrast: 20mL MULTIHANCE GADOBENATE DIMEGLUMINE 529 MG/ML IV SOLN  Comparison:  CTA head and neck 04/30/2012 at [facility]. MRI brain without contrast 04/30/2012 at [facility].  MRI HEAD  Findings:  The right posterior frontal lobe non hemorrhagic infarct has not change significantly on diffusion weighted images.  T2 and FLAIR hyperintensity associated with this infarct are stable as well. Diffusion signal on the left likely represents susceptibility associated with previously documented remote hemorrhage or calcification in the posterior to the area of encephalomalacia in the left frontal lobe.  Flow is present in the major intracranial arteries remote lacunar infarcts of the basal ganglia are stable.  Extensive white matter disease is similar to the prior exam.  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 Flow is present in the left internal carotid artery in the vertebral basilar system.  MCA branch vessels are within normal limits bilaterally.  The patient is status post bilateral lens extractions.  The paranasal sinuses and mastoid air cells are clear.  IMPRESSION:  1.  Expected evolution of a non hemorrhagic posterior right frontal lobe MCA territory infarct. 2.  No acute intracranial abnormality. 3.  Remote encephalomalacia of the  anterior  left frontal lobe is stable. 4.  Atrophy diffuse white matter is stable. 5.  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  MRA HEAD  Findings: The right internal carotid artery is occluded with reconstitution at the level of the ophthalmic segment.  There is some irregularity within the ophthalmic segment.  Stenosis is less than 50%.  Signal intensity is less robust than on the left.  Focal narrowing is present in the proximal right A1 segment.  The left cervical internal carotid artery is markedly tortuous without focal stenosis.  The left A1 and M1 segments are normal.  The no definite anterior communicating artery is identified on this exam although the anterior communicating artery was present on recent CTA.  The left MCA bifurcation is within normal limits.  Segmental narrowing of distal MCA branch vessels is evident.  There is significant decrease in signal intensity within the right MCA branch vessels, suggesting decreased flow.  The vertebral arteries are codominant.  The PICA origins are visualized and within normal limits bilaterally.  The basilar artery is tortuous, but within normal limits.  Both posterior cerebral arteries originate from basilar tip.  There is some attenuation of distal PCA branch vessels bilaterally.  IMPRESSION:  1.  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 2.  Significantly diminished signal within the right MCA and ACA territories, compatible with decreased flow. 3.  Although the anterior communicating artery was patent on the recent CT, it is not well visualized, suggesting decreased flow. 4.  Moderate small vessel disease.  MRA NECK  Findings: Time-of-flight images demonstrate signal loss at the origin of the right internal carotid artery.  There is no significant flow disturbance on the left.  Flow is antegrade in the vertebral arteries bilaterally.  The anterior aspect of  the arch is incompletely imaged and the origins of the great vessels cannot be properly evaluated.  A standard three-vessel arch configuration appears to be present. The vertebral arteries originate from the subclavian arteries bilaterally.  Poor signal is evident bilaterally.  Based on recent CTA, this is artifactual on the right and compatible with a stenosis on the left.  The remainder of the vertebral arteries are within normal limits bilaterally.  There is mild segmental narrowing of the distal right vertebral artery, is proximal to the vertebral basilar junction.  The right internal carotid artery is occluded approximately 5 mm from the bifurcation.  The vessels reconstituted in the cavernous carotid artery.  The left common carotid artery is within normal limits.  The left bifurcation demonstrates no significant stenosis.  Moderate tortuosity is present in the distal left cervical ICA without a significant stenosis.  IMPRESSION:  1.  Occluded right internal carotid artery. Contrast within the more proximal cavernous segment likely still represents reconstitution at the ophthalmic segment. 2.  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 3.  Artifactual signal loss at the origin of the right vertebral artery. 4.  Marked tortuosity of the cervical left internal carotid artery without significant stenosis. 5.  Mild atherosclerotic narrowing of the distal right vertebral artery, just proximal to the vertebral basilar junction, without significant stenosis.   Original Report Authenticated By: Marin Roberts, M.D.    Mr Laqueta Jean Wo Contrast  05/04/2012  *RADIOLOGY REPORT*  Clinical Data:  Recent CVA with new left hand weakness.  Numbness in the left upper lip.  MRI HEAD WITHOUT AND WITH CONTRAST MRA HEAD WITHOUT  CONTRAST MRA NECK WITHOUT AND WITH CONTRAST  Technique:  Multiplanar, multiecho pulse sequences of the brain and surrounding structures were obtained without and  with intravenous contrast.  Angiographic images of the Circle of Willis were obtained using MRA technique without intravenous contrast. Angiographic images of the neck were obtained using MRA technique without and with intravenous contrast.  Carotid stenosis measurements (when applicable) are obtained utilizing NASCET criteria, using the distal internal carotid diameter as the denominator.  Contrast: 20mL MULTIHANCE GADOBENATE DIMEGLUMINE 529 MG/ML IV SOLN  Comparison:  CTA head and neck 04/30/2012 at [facility] MRI brain without contrast 04/30/2012 at [facility].  MRI HEAD  Findings:  The right posterior frontal lobe non hemorrhagic infarct has not change significantly on diffusion weighted images.  T2 and FLAIR hyperintensity associated with this infarct are stable as well. Diffusion signal on the left likely represents susceptibility associated with previously documented remote hemorrhage or calcification in the posterior to the area of encephalomalacia in the left frontal lobe.  Flow is present in the major intracranial arteries remote lacunar infarcts of the basal ganglia are stable.  Extensive white matter disease is similar to the prior exam.  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 Flow is present in the left internal carotid artery in the vertebral basilar system.  MCA branch vessels are within normal limits bilaterally.  The patient is status post bilateral lens extractions.  The paranasal sinuses and mastoid air cells are clear.  IMPRESSION:  1.  Expected evolution of a non hemorrhagic posterior right frontal lobe MCA territory infarct. 2.  No acute intracranial abnormality. 3.  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 4.  Atrophy diffuse white matter is stable. 5.  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  MRA HEAD  Findings: The right internal carotid artery is  occluded with reconstitution at the level of the ophthalmic segment.  There is some irregularity within the ophthalmic segment.  Stenosis is less than 50%.  Signal intensity is less robust than on the left.  Focal narrowing is present in the proximal right A1 segment.  The left cervical internal carotid artery is markedly tortuous without focal stenosis.  The left A1 and M1 segments are normal.  The no definite anterior communicating artery is identified on this exam although the anterior communicating artery was present on recent CTA.  The left MCA bifurcation is within normal limits.  Segmental narrowing of distal MCA branch vessels is evident.  There is significant decrease in signal intensity within the right MCA branch vessels, suggesting decreased flow.  The vertebral arteries are codominant.  The PICA origins are visualized and within normal limits bilaterally.  The basilar artery is tortuous, but within normal limits.  Both posterior cerebral arteries originate from basilar tip.  There is some attenuation of distal PCA branch vessels bilaterally.  IMPRESSION:  1.  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 2.  Significantly diminished signal within the right MCA and ACA territories, compatible with decreased flow. 3.  Although the anterior communicating artery was patent on the recent CT, it is not well visualized, suggesting decreased flow. 4.  Moderate small vessel disease.  MRA NECK  Findings: Time-of-flight images demonstrate signal loss at the origin of the right internal carotid artery.  There is no significant flow disturbance on the left.  Flow is antegrade in the vertebral  arteries bilaterally.  The anterior aspect of the arch is incompletely imaged and the origins of the great vessels cannot be properly evaluated.  A standard three-vessel arch configuration appears to be present. The vertebral arteries originate from the subclavian arteries  bilaterally.  Poor signal is evident bilaterally.  Based on recent CTA, this is artifactual on the right and compatible with a stenosis on the left.  The remainder of the vertebral arteries are within normal limits bilaterally.  There is mild segmental narrowing of the distal right vertebral artery, is proximal to the vertebral basilar junction.  The right internal carotid artery is occluded approximately 5 mm from the bifurcation.  The vessels reconstituted in the cavernous carotid artery.  The left common carotid artery is within normal limits.  The left bifurcation demonstrates no significant stenosis.  Moderate tortuosity is present in the distal left cervical ICA without a significant stenosis.  IMPRESSION:  1.  Occluded right internal carotid artery. Contrast within the more proximal cavernous segment likely still represents reconstitution at the ophthalmic segment. 2.  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 3.  Artifactual signal loss at the origin of the right vertebral artery. 4.  Marked tortuosity of the cervical left internal carotid artery without significant stenosis. 5.  Mild atherosclerotic narrowing of the distal right vertebral artery, just proximal to the vertebral basilar junction, without significant stenosis.   Original Report Authenticated By: Marin Roberts, M.D.     Microbiology: Recent Results (from the past 240 hour(s))  MRSA PCR SCREENING     Status: Normal   Collection Time   05/04/12  3:00 AM      Component Value Range Status Comment   MRSA by PCR NEGATIVE  NEGATIVE Final      Labs: Basic Metabolic Panel:  Lab 05/05/12 1610 05/03/12 1652 05/03/12 1613  NA 141 138 134*  K 3.8 4.0 3.9  CL 104 104 101  CO2 29 -- 25  GLUCOSE 111* 114* 121*  BUN 10 17 17   CREATININE 0.48* 0.80 0.65  CALCIUM 9.1 -- 9.2  MG -- -- --  PHOS -- -- --   Liver Function Tests:  Lab 05/03/12 1613  AST 16  ALT 14  ALKPHOS 108  BILITOT 0.4    PROT 6.7  ALBUMIN 3.5   No results found for this basename: LIPASE:5,AMYLASE:5 in the last 168 hours No results found for this basename: AMMONIA:5 in the last 168 hours CBC:  Lab 05/03/12 1652 05/03/12 1613  WBC -- 8.8  NEUTROABS -- 5.9  HGB 13.9 14.0  HCT 41.0 41.8  MCV -- 89.5  PLT -- 312   Cardiac Enzymes:  Lab 05/03/12 1613  CKTOTAL --  CKMB --  CKMBINDEX --  TROPONINI <0.30   BNP: BNP (last 3 results) No results found for this basename: PROBNP:3 in the last 8760 hours CBG:  Lab 05/03/12 1634  GLUCAP 109*       Signed:  Zacchaeus Halm C  [facility] 05/05/2012, 1:56 PM

## 2012-05-05 NOTE — Progress Notes (Signed)
Occupational Therapy Treatment Patient Details Name: Kyle Lewis MRN: [id number] DOB: 13-Apr-1946 Today's Date: 05/05/2012 Time: 1040-1055 OT Time Calculation (min): 15 min  OT Assessment / Plan / Recommendation Comments on Treatment Session Goals met, No further acute OT needs.    Follow Up Recommendations  No OT follow up    Barriers to Discharge       Equipment Recommendations  None recommended by OT    Recommendations for Other Services    Frequency Min 2X/week   Plan Discharge plan remains appropriate;All goals met and education completed, patient discharged from OT services    Precautions / Restrictions Precautions Precautions: Fall Restrictions Weight Bearing Restrictions: No   Pertinent Vitals/Pain See vitals    ADL  ADL Comments: Focus of session on fine motor HEP.  Provided pt with hand out describing multiple fine motor activities. Also gave pt theraputty (tan). Instructed pt on putting small objects such as coins or beads in putty and using Left hand to locate objects and pull them out.  Also gave pt several marbles to practice picking up one at a time and placing in cup one at a time.  Pt appreciative for handouts and theraputty and reports he has no further questions. Reports left hand feels the same as during eval on 11/30.    OT Diagnosis:    OT Problem List:   OT Treatment Interventions:     OT Goals Arm Goals Additional Arm Goal #1: Pt wil be independent with LUE FM activities. Arm Goal: Additional Goal #1 - Progress: Met  Visit Information  Last OT Received On: 05/05/12 Assistance Needed: +1    Subjective Data      Prior Functioning       Cognition  Overall Cognitive Status: Appears within functional limits for tasks assessed/performed Arousal/Alertness: Awake/alert Orientation Level: Appears intact for tasks assessed Behavior During Session: [facility] for tasks performed    Mobility  Shoulder Instructions Bed Mobility Bed Mobility: Not  assessed Transfers Transfers: Not assessed       Exercises   Hand Exercises Thumb Abduction: AROM;Left;10 reps;Seated Opposition: AROM;Left;10 reps;Seated Other Exercises Other Exercises: Provided handouts and theraputty with instructions for fine motor activities.   Balance    End of Session OT - End of Session Activity Tolerance: Patient tolerated treatment well Patient left: in bed;with call bell/phone within reach;with nursing in room  GO     Kyle Lewis 05/05/2012, 11:07 AM

## 2012-05-05 NOTE — Progress Notes (Signed)
Physical Therapy Treatment Patient Details Name: Kyle Lewis MRN: [id number] DOB: 13-Sep-1945 Today's Date: 05/05/2012 Time: 5621-3086 PT Time Calculation (min): 24 min  PT Assessment / Plan / Recommendation Comments on Treatment Session  Patient did well with ambulation and stairs.  Patient at pre-hospital functional level.  May need to continue OP PT for Lt. TKA (patient with appointment with orthopedics later in week to follow-up)  No further acute PT needs identified - PT will sign off.    Follow Up Recommendations  Supervision - Intermittent;Outpatient PT (at later time per orthopedics)     Does the patient have the potential to tolerate intense rehabilitation     Barriers to Discharge        Equipment Recommendations  None recommended by PT    Recommendations for Other Services    Frequency     Plan Discharge plan needs to be updated;All goals met and education completed, patient dischaged from PT services    Precautions / Restrictions Precautions Precautions: Fall Restrictions Weight Bearing Restrictions: No   Pertinent Vitals/Pain     Mobility  Bed Mobility Bed Mobility: Not assessed Transfers Transfers: Sit to Stand;Stand to Sit Sit to Stand: 6: Modified independent (Device/Increase time);With upper extremity assist;With armrests;From chair/3-in-1 Stand to Sit: 6: Modified independent (Device/Increase time);With upper extremity assist;With armrests;To chair/3-in-1 Details for Transfer Assistance: No cues or assist needed Ambulation/Gait Ambulation/Gait Assistance: 6: Modified independent (Device/Increase time) Ambulation Distance (Feet): 200 Feet Assistive device: Straight cane Ambulation/Gait Assistance Details: Patient continues with antalgic gait from Lt. TKA.  Gait stable with good balance. Gait Pattern: Step-through pattern;Decreased stance time - left;Decreased step length - right;Antalgic;Trunk flexed Gait velocity: slow gait speed Stairs: Yes Stairs  Assistance: 5: Supervision Stairs Assistance Details (indicate cue type and reason): Patient recalled proper technique on stairs from education related to TKA.  Able to complete with supervision. Stair Management Technique: One rail Left;With cane;Step to pattern;Forwards Number of Stairs: 5  (x2) Modified Rankin (Stroke Patients Only) Pre-Morbid Rankin Score: No significant disability Modified Rankin: Slight disability    Exercises Total Joint Exercises Quad Sets: AROM;Left;10 reps;Seated Long Arc Quad: AROM;Left;10 reps;Seated Knee Flexion: AROM;AAROM;Left;10 reps;Seated (Using RLE to assist flexion of LLE in sitting)     PT Goals Acute Rehab PT Goals PT Goal: Sit to Stand - Progress: Met PT Goal: Ambulate - Progress: Met PT Goal: Up/Down Stairs - Progress: Met Additional Goals PT Goal: Additional Goal #1 - Progress: Partly met  Visit Information  Last PT Received On: 05/05/12 Assistance Needed: +1    Subjective Data  Subjective: "My knee feels a little tight today"   Cognition  Overall Cognitive Status: Appears within functional limits for tasks assessed/performed Arousal/Alertness: Awake/alert Orientation Level: Appears intact for tasks assessed Behavior During Session: [facility] for tasks performed    Balance  Balance Balance Assessed: Yes High Level Balance High Level Balance Activites: Turns;Sudden stops;Head turns High Level Balance Comments: No loss of balance during high level balance activities  End of Session PT - End of Session Equipment Utilized During Treatment: Gait belt Activity Tolerance: Patient tolerated treatment well Patient left: in chair;with call bell/phone within reach Nurse Communication: Mobility status (Encouraged ambulation in hallway with nursing)   GP     Vena Austria 05/05/2012, 8:44 AM Durenda Hurt. Renaldo Fiddler, [facility] Acute Rehab Services Pager [phone]

## 2012-05-22 ENCOUNTER — Encounter: Payer: Self-pay | Admitting: Vascular Surgery

## 2012-05-23 ENCOUNTER — Encounter: Payer: Self-pay | Admitting: Vascular Surgery

## 2012-05-23 ENCOUNTER — Ambulatory Visit (INDEPENDENT_AMBULATORY_CARE_PROVIDER_SITE_OTHER): Payer: Medicare Other | Admitting: Vascular Surgery

## 2012-05-23 ENCOUNTER — Other Ambulatory Visit (INDEPENDENT_AMBULATORY_CARE_PROVIDER_SITE_OTHER): Payer: Medicare Other | Admitting: Vascular Surgery

## 2012-05-23 VITALS — BP 132/72 | HR 82 | Ht 67.0 in | Wt 246.9 lb

## 2012-05-23 DIAGNOSIS — I6529 Occlusion and stenosis of unspecified carotid artery: Secondary | ICD-10-CM

## 2012-05-23 DIAGNOSIS — I779 Disorder of arteries and arterioles, unspecified: Secondary | ICD-10-CM

## 2012-05-23 HISTORY — DX: Disorder of arteries and arterioles, unspecified: I77.9

## 2012-05-23 NOTE — Progress Notes (Signed)
History of Present Illness:  Patient is a 66 y.o. year old male who presents for evaluation of carotid stenosis.  The patient was recently admitted to [facility] for a stroke workup. This showed occlusion of the right internal carotid artery. At that time the left internal carotid artery also had some stenosis. He had some tingling in his left fingertips and left lip at that time. This has improved somewhat. The patient denies any prior symptoms of TIA, amaurosis, or stroke.  The patient is currently on Plavix antiplatelet therapy.  Other medical problems include sleep apnea requiring CPAP, hypertension, COPD.  These are currently stable. He also has a history of tobacco abuse. He currently smokes about one pack of cigarettes per day. Greater than 3 minutes they were spent regarding smoking cessation counseling.  Past Medical History  Diagnosis Date  . Diverticulosis   . Adenomatous colon polyp   . Sleep apnea     wears CPAP  . Allergy     seasonal  . Arthritis   . Cataract   . Hypertension   . Nocturia   . BPH (benign prostatic hyperplasia)   . Adrenal mass   . Chronic back pain   . CVA (cerebral infarction)   . COPD (chronic obstructive pulmonary disease)   . Peripheral vascular disease   . Stroke     Past Surgical History  Procedure Date  . Umbilical hernia repair   . Tonsillectomy   . Appendectomy   . Brain meningioma excision   . Carpal tunnel release     left  . Cataracts     both eyes  . Inguinal hernia repair   . Transurethral resection of prostate   . 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 Smoking status: Current Every Day Smoker -- 0.5 packs/day for 20 years    Types: Cigarettes  . Smokeless tobacco: Never Used     Comment: pt states he is trying to quit  . Alcohol Use: Yes   Comment: rare    Family History Family History  Problem Relation Age of Onset  . Prostate cancer Maternal Grandfather   . Colon cancer Neg Hx   . Esophageal cancer Neg Hx   . Rectal cancer Neg Hx   . Stomach cancer Neg Hx   . Heart disease Mother   . Hypertension Mother   . Diabetes Father   . Heart attack Father     Allergies  No Known Allergies   Current Outpatient Prescriptions  Medication Sig Dispense Refill  . B Complex-C (B-COMPLEX WITH VITAMIN C) tablet Take 1 tablet by mouth daily.      . Cholecalciferol (VITAMIN D-3) 5000 UNITS TABS Take 1 tablet by mouth daily.      . clopidogrel (PLAVIX) 75 MG tablet Take 75 mg by mouth daily.      . finasteride (PROSCAR) 5 MG tablet Take 5 mg by mouth daily.      Marland Kitchen losartan-hydrochlorothiazide (HYZAAR) 100-25 MG per tablet Take 1 tablet by mouth every morning.      . meloxicam (MOBIC) 15 MG tablet Take 15 mg by mouth daily.      . Multiple Vitamin (MULTIVITAMIN WITH MINERALS) TABS Take 1 tablet by mouth daily.      Marland Kitchen omega-3 acid ethyl esters (LOVAZA) 1 G capsule  Take 2 g by mouth 2 (two) times daily.      . Tamsulosin HCl (FLOMAX) 0.4 MG CAPS Take 0.4 mg by mouth daily after supper.       . vitamin C (ASCORBIC ACID) 500 MG tablet Take 500 mg by mouth daily.      Marland Kitchen docusate sodium 100 MG CAPS Take 100 mg by mouth 2 (two) times daily.  60 capsule  0  . oxyCODONE (OXY IR/ROXICODONE) 5 MG immediate release tablet Take 1-4 tablets (5-20 mg total) by mouth every 3 (three) hours as needed.  120 tablet  0  . polyethylene glycol (MIRALAX / GLYCOLAX) packet Take 17 g by mouth daily as needed.  14 each  0  . traMADol (ULTRAM) 50 MG tablet Take 1-2 tablets (50-100 mg total) by mouth every 6 (six) hours as needed.  60 tablet  0  . Vitamin D, Ergocalciferol, (DRISDOL) 50000 UNITS CAPS Take 50,000 Units by mouth every 7 (seven) days.        ROS:   General:  No weight loss, Fever, chills  HEENT: No recent headaches, no nasal bleeding, no  visual changes, no sore throat  Neurologic: No dizziness, blackouts, seizures. No recent symptoms of stroke or mini- stroke. No recent episodes of slurred speech, or temporary blindness.  Cardiac: No recent episodes of chest pain/pressure, no shortness of breath at rest.  No shortness of breath with exertion.  Denies history of atrial fibrillation or irregular heartbeat  Vascular: No history of rest pain in feet.  No history of claudication.  No history of non-healing ulcer, No history of DVT   Pulmonary: No home oxygen, no productive cough, no hemoptysis,  No asthma or wheezing  Musculoskeletal:  [ ]  Arthritis, [ ]  Low back pain,  [ ]  Joint pain  Hematologic:No history of hypercoagulable state.  No history of easy bleeding.  No history of anemia  Gastrointestinal: No hematochezia or melena,  No gastroesophageal reflux, no trouble swallowing  Urinary: [ ]  chronic Kidney disease, [ ]  on HD - [ ]  MWF or [ ]  TTHS, [ ]  Burning with urination, [ ]  Frequent urination, [ ]  Difficulty urinating;   Skin: No rashes  Psychological: No history of anxiety,  No history of depression   Physical Examination  Filed Vitals:   05/23/12 1147 05/23/12 1151  BP: 118/73 132/72  Pulse: 82   Height: 5\' 7"  (1.702 m)   Weight: 246 lb 14.4 oz (111.993 kg)   SpO2: 98%     Body mass index is 38.67 kg/(m^2).  General:  Alert and oriented, no acute distress HEENT: Normal Neck: No bruit or JVD Pulmonary: Clear to auscultation bilaterally Cardiac: Regular Rate and Rhythm without murmur Gastrointestinal: Soft, non-tender, non-distended, no mass, periumbilical scar Skin: No rash Extremity Pulses:  2+ radial, brachial, femoral, absent dorsalis pedis, posterior tibial pulses bilaterally Musculoskeletal: No deformity or edema  Neurologic: Upper and lower extremity motor 5/5 and symmetric, no facial droop  DATA: A carotid duplex exam was obtained today. I reviewed and interpreted this study. This shows a  40-60% left internal carotid artery stenosis. 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 Also if he exhibits any symptoms of left brain TIA or stroke symptoms we would need  to consider left-sided carotid endarterectomy. He will continue to take his Plavix. We'll try to quit smoking. He will followup with a repeat duplex exam in 6 months. Continue medical therapy for right internal carotid artery occlusion no surgical intervention for this   Fabienne Bruns, MD [facility] Office: [phone] Pager: [phone]

## 2012-09-03 ENCOUNTER — Other Ambulatory Visit: Payer: Self-pay | Admitting: Neurology

## 2012-11-21 ENCOUNTER — Ambulatory Visit: Payer: Medicare Other | Admitting: Neurosurgery

## 2012-11-21 ENCOUNTER — Other Ambulatory Visit (INDEPENDENT_AMBULATORY_CARE_PROVIDER_SITE_OTHER): Payer: Medicare Other | Admitting: *Deleted

## 2012-11-21 DIAGNOSIS — I6529 Occlusion and stenosis of unspecified carotid artery: Secondary | ICD-10-CM

## 2012-11-22 ENCOUNTER — Other Ambulatory Visit: Payer: Self-pay

## 2012-11-27 ENCOUNTER — Encounter: Payer: Self-pay | Admitting: Vascular Surgery

## 2012-12-04 ENCOUNTER — Ambulatory Visit: Payer: Self-pay | Admitting: Nurse Practitioner

## 2013-02-25 ENCOUNTER — Ambulatory Visit: Payer: Self-pay | Admitting: Nurse Practitioner

## 2013-04-02 ENCOUNTER — Ambulatory Visit: Payer: Self-pay | Admitting: Nurse Practitioner

## 2013-06-27 ENCOUNTER — Telehealth: Payer: Self-pay | Admitting: Neurology

## 2013-06-27 NOTE — Telephone Encounter (Signed)
Kyle Lewis called in thinking that there was an appointment for Kyle Lewis in January.  There was none scheduled.  Was in process of making appointment and it appeared that the 1st available for Dr. Leonie Man was in April and Kyle Lewis's schedule was fairly booked as well. I asked if he was having any new symptoms and Kyle Lewis said "NO" while Kyle Lewis said "yes".  Kyle Lewis made the comment that last week the patient was asleep in his recliner and she and another family had a hard time waking him up. [name] denied this but Kyle Lewis feels this is a new symptom and would like to see if there would be any way to get him in to see either Kyle Sauer or Dr. Leonie Man earlier.  Please contact.

## 2013-06-27 NOTE — Telephone Encounter (Signed)
Called and spoke with patient and he said that he has never had this problem before, informed him to contact Pcp first and get a referral for this problem, he verbalized understanding and said that he would

## 2013-07-18 ENCOUNTER — Other Ambulatory Visit: Payer: Self-pay | Admitting: Vascular Surgery

## 2013-07-18 DIAGNOSIS — I6529 Occlusion and stenosis of unspecified carotid artery: Secondary | ICD-10-CM

## 2013-09-13 ENCOUNTER — Other Ambulatory Visit: Payer: Self-pay | Admitting: Neurology

## 2013-10-10 ENCOUNTER — Encounter: Payer: Self-pay | Admitting: *Deleted

## 2013-10-13 ENCOUNTER — Ambulatory Visit (INDEPENDENT_AMBULATORY_CARE_PROVIDER_SITE_OTHER): Payer: Medicare Other | Admitting: Nurse Practitioner

## 2013-10-13 ENCOUNTER — Encounter: Payer: Self-pay | Admitting: Nurse Practitioner

## 2013-10-13 ENCOUNTER — Encounter (INDEPENDENT_AMBULATORY_CARE_PROVIDER_SITE_OTHER): Payer: Self-pay

## 2013-10-13 VITALS — BP 119/74 | HR 73 | Ht 66.5 in | Wt 264.0 lb

## 2013-10-13 DIAGNOSIS — I6350 Cerebral infarction due to unspecified occlusion or stenosis of unspecified cerebral artery: Secondary | ICD-10-CM

## 2013-10-13 DIAGNOSIS — I639 Cerebral infarction, unspecified: Secondary | ICD-10-CM

## 2013-10-13 DIAGNOSIS — I10 Essential (primary) hypertension: Secondary | ICD-10-CM

## 2013-10-13 DIAGNOSIS — G4730 Sleep apnea, unspecified: Secondary | ICD-10-CM

## 2013-10-13 MED ORDER — CLOPIDOGREL BISULFATE 75 MG PO TABS: 75.0000 mg | ORAL_TABLET | Freq: Every day | ORAL | Status: DC

## 2013-10-13 NOTE — Progress Notes (Signed)
[facility]  PATIENT: Kyle Lewis DOB: 1945-08-12   REASON FOR VISIT: Followup for history of stroke 05/03/2012    HISTORY OF PRESENT ILLNESS: Kyle Lewis, 68 year old male returns for followup. He was last seen in this office 08/14/2012. 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 He has been on Plavix since that time for secondary stroke prevention without further stroke or TIA symptoms. His blood pressure has been in good control and his lipids are followed by his primary care Dr. Alona Bene in [facility]. His most recent carotid Doppler was done 11/21/2012 by vascular and vein specialist at [facility]. 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 Right internal carotid artery not evaluated due to occlusion. Patient is not scheduled for another Doppler he claims. He does little exercise. He has minimal bruising from his Plavix. He also has obstructive sleep apnea and is compliant with his CPAP. He continues to smoke about 10 cigarettes a day. He has no new neurologic complaints  HISTORY developed sudden onset of left upper limb and hand  numbness one week prior to presentation. He was initially seen at [facility] and diagnosed with a TIA. He had outpatient workup arrange an MRI scan subsequently showed a right frontal MCA branch infarct. CT angiogram showed complete occlusion of the right internal carotid artery at its origin. There was some reconstitution in the ophthalmic segment. Left vertebral artery also had proximal high-grade stenosis. The patient was started on Plavix for secondary stroke prevention 2-D echocardiogram showed normal ejection fraction. Patient states is done well since discharge and he has only minimum tingling in his left upper lip and fingertips with some diminished fine motor  skills. He has otherwise made good recovery and has no significant physical or cognitive limitations. He wants to go back to driving. 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 Is tolerating Plavix without bleeding, bruising or other side effects. He states his blood pressure is under good control. He does have sleep apnea and uses his CPAP every night. He claimed to have quit smoking when asked him but while filling out the paperwork he stated that he still smokes half a pack per day. He was not aware of any known diagnosis of right carotid stenosis. He denies any significant injury to his neck or neck pain to suggest possible carotid dissection as etiology of his carotid occlusion.  REVIEW OF SYSTEMS: Full 14 system review of systems performed and notable only for those listed, all others are neg:  Constitutional: N/A  Cardiovascular: N/A  Ear/Nose/Throat: N/A  Skin: N/A  Eyes: N/A  Respiratory: N/A  Gastroitestinal: Urinary frequency  Hematology/Lymphatic: Easy bruising  Endocrine: N/A Musculoskeletal: Joint pain  Allergy/Immunology: N/A  Neurological: N/A Psychiatric: N/A Sleep : Obstructive sleep apnea   ALLERGIES: No Known Allergies  HOME MEDICATIONS: Outpatient Prescriptions Prior to Visit  Medication Sig Dispense Refill  . B Complex-C (B-COMPLEX WITH VITAMIN C) tablet Take 1 tablet by mouth daily.      . Cholecalciferol (VITAMIN D-3) 5000 UNITS TABS Take 1 tablet by mouth daily.      . clopidogrel (PLAVIX) 75 MG tablet TAKE 1 TABLET EVERY DAY  90 tablet  0  . finasteride (PROSCAR) 5 MG tablet Take 5 mg by mouth daily.      Marland Kitchen losartan-hydrochlorothiazide (HYZAAR) 100-25 MG per  tablet Take 1 tablet by mouth every morning.      . Multiple Vitamin (MULTIVITAMIN WITH MINERALS) TABS Take 1 tablet by mouth daily.      Marland Kitchen omega-3 acid ethyl esters (LOVAZA) 1 G capsule Take 2 g by mouth 2 (two) times daily.      . Tamsulosin HCl (FLOMAX) 0.4 MG CAPS Take  0.4 mg by mouth daily after supper.       . vitamin C (ASCORBIC ACID) 500 MG tablet Take 500 mg by mouth daily.      Marland Kitchen oxyCODONE (OXY IR/ROXICODONE) 5 MG immediate release tablet Take 1-4 tablets (5-20 mg total) by mouth every 3 (three) hours as needed.  120 tablet  0  . docusate sodium 100 MG CAPS Take 100 mg by mouth 2 (two) times daily.  60 capsule  0  . meloxicam (MOBIC) 15 MG tablet Take 15 mg by mouth daily.      . polyethylene glycol (MIRALAX / GLYCOLAX) packet Take 17 g by mouth daily as needed.  14 each  0  . traMADol (ULTRAM) 50 MG tablet Take 1-2 tablets (50-100 mg total) by mouth every 6 (six) hours as needed.  60 tablet  0  . Vitamin D, Ergocalciferol, (DRISDOL) 50000 UNITS CAPS Take 50,000 Units by mouth every 7 (seven) days.       No facility-administered medications prior to visit.    PAST MEDICAL HISTORY: Past Medical History  Diagnosis Date  . Diverticulosis   . Adenomatous colon polyp   . Sleep apnea     wears CPAP  . Allergy     seasonal  . Arthritis   . Cataract   . Hypertension   . Nocturia   . BPH (benign prostatic hyperplasia)   . Adrenal mass   . Chronic back pain   . CVA (cerebral infarction)   . COPD (chronic obstructive pulmonary disease)   . Peripheral vascular disease   . Stroke     PAST SURGICAL HISTORY: Past Surgical History  Procedure Laterality Date  . Umbilical hernia repair    . Tonsillectomy    . Appendectomy    . Brain meningioma excision    . Carpal tunnel release      left  . Cataracts      both eyes  . Inguinal hernia repair    . Transurethral resection of prostate    . Total knee arthroplasty  04/01/2012    Procedure: TOTAL KNEE ARTHROPLASTY;  Surgeon: Gearlean Alf, MD;  Location: [facility];  Service: Orthopedics;  Laterality: Left;    FAMILY HISTORY: Family History  Problem Relation Age of Onset  . Prostate cancer Maternal Grandfather   . Colon cancer Neg Hx   . Esophageal cancer Neg Hx   . Rectal cancer Neg Hx   .  Stomach cancer Neg Hx   . Heart disease Mother   . Hypertension Mother   . Diabetes Father   . Heart attack Father     SOCIAL HISTORY: History   Social History  . Marital Status: Married    Spouse Name: [name]     Number of Children: 2  . Years of Education: College    Occupational History  .      Self employed Administrator    Social History Main Topics  . Smoking status: Current Every Day Smoker -- 0.50 packs/day for 20 years    Types: Cigarettes  . Smokeless tobacco: Never Used     Comment: pt states  he is trying to quit  . Alcohol Use: Yes     Comment: rare  . Drug Use: No  . Sexual Activity: Not on file   Other Topics Concern  . Not on file   Social History Narrative   Patient lives at home with his wife. Rhonda    Patient has a Financial risk analyst.    Patient has 2 sons.    Patient smokes a half pack a day.      PHYSICAL EXAM  Filed Vitals:   10/13/13 1519  BP: 119/74  Pulse: 73  Height: 5' 6.5" (1.689 m)  Weight: 264 lb (119.75 kg)   Body mass index is 41.98 kg/(m^2).  Generalized: Well developed, morbidly obese male in no acute distress  Head: normocephalic and atraumatic,. Oropharynx benign  Neck: Supple, no carotid bruits  Cardiac: Regular rate rhythm, no murmur  Musculoskeletal: No deformity   Neurological examination   Mentation: Alert oriented to time, place, history taking. Follows all commands speech and language fluent  Cranial nerve II-XII: Pupils were equal round reactive to light extraocular movements were full, visual field were full on confrontational test. Facial sensation and strength were normal. hearing was intact to finger rubbing bilaterally. Uvula tongue midline. head turning and shoulder shrug were normal and symmetric.Tongue protrusion into cheek strength was normal. Motor: normal bulk and tone, full strength in the BUE, BLE, fine finger movements normal, no pronator drift. No focal weakness Sensory: normal and symmetric to  light touch, pinprick, and  vibration  Coordination: finger-nose-finger, heel-to-shin bilaterally, no dysmetria Reflexes: Brachioradialis 2/2, biceps 2/2, triceps 2/2, patellar 2/2, Achilles 2/2, plantar responses were flexor bilaterally. Gait and Station: Rising up from seated position without assistance, normal stance,  moderate stride, good arm swing, smooth turning, able to perform tiptoe, and heel walking without difficulty. Tandem gait is mildly unsteady  DIAGNOSTIC DATA (LABS, IMAGING, TESTING) -    ASSESSMENT AND PLAN  68 y.o. year old male  has a past medical history of right frontal embolic infarct secondary to right internal carotid artery occlusion in November 2013. Multiple risk factors of obesity smoking hypertension and obstructive sleep apnea.  Continue Plavix for secondary stroke prevention Set up for carotid Dopplers, last done 11/21/2012 by vascular and vein specialist Keep  blood pressure 093 systolic or below excellent reading today Moderate exercise for overall general health Lipids followed by primary care Stop smoking  Followup in 6-8 months Dennie Bible, [facility], [facility], [facility] [address] [place], [place] [address] [phone]

## 2013-10-13 NOTE — Patient Instructions (Addendum)
Continue Plavix for secondary stroke prevention Set up for carotid Dopplers, last done 11/21/2012 vascular and vein specialist Keep blood pressure 264 systolic or below excellent reading today Moderate exercise for overall general health Lipids followed by primary care Followup in 6-8 months

## 2013-11-08 DIAGNOSIS — R3129 Other microscopic hematuria: Secondary | ICD-10-CM

## 2013-11-08 DIAGNOSIS — E291 Testicular hypofunction: Secondary | ICD-10-CM

## 2013-11-08 DIAGNOSIS — N318 Other neuromuscular dysfunction of bladder: Secondary | ICD-10-CM | POA: Insufficient documentation

## 2013-11-08 DIAGNOSIS — Z720 Tobacco use: Secondary | ICD-10-CM | POA: Insufficient documentation

## 2013-11-08 DIAGNOSIS — R351 Nocturia: Secondary | ICD-10-CM | POA: Insufficient documentation

## 2013-11-08 DIAGNOSIS — K439 Ventral hernia without obstruction or gangrene: Secondary | ICD-10-CM

## 2013-11-08 DIAGNOSIS — E6609 Other obesity due to excess calories: Secondary | ICD-10-CM | POA: Insufficient documentation

## 2013-11-08 DIAGNOSIS — N179 Acute kidney failure, unspecified: Secondary | ICD-10-CM | POA: Insufficient documentation

## 2013-11-08 DIAGNOSIS — K567 Ileus, unspecified: Secondary | ICD-10-CM | POA: Insufficient documentation

## 2013-11-08 DIAGNOSIS — Z87891 Personal history of nicotine dependence: Secondary | ICD-10-CM

## 2013-11-08 DIAGNOSIS — E860 Dehydration: Secondary | ICD-10-CM | POA: Insufficient documentation

## 2013-11-08 DIAGNOSIS — E878 Other disorders of electrolyte and fluid balance, not elsewhere classified: Secondary | ICD-10-CM | POA: Insufficient documentation

## 2013-11-08 DIAGNOSIS — R112 Nausea with vomiting, unspecified: Secondary | ICD-10-CM | POA: Insufficient documentation

## 2013-11-08 HISTORY — DX: Other neuromuscular dysfunction of bladder: N31.8

## 2013-11-08 HISTORY — DX: Ventral hernia without obstruction or gangrene: K43.9

## 2013-11-08 HISTORY — DX: Testicular hypofunction: E29.1

## 2013-11-08 HISTORY — DX: Tobacco use: Z72.0

## 2013-11-08 HISTORY — DX: Other obesity due to excess calories: E66.09

## 2013-11-08 HISTORY — DX: Other microscopic hematuria: R31.29

## 2013-11-08 HISTORY — DX: Personal history of nicotine dependence: Z87.891

## 2013-11-26 ENCOUNTER — Encounter: Payer: Self-pay | Admitting: Family

## 2013-11-27 ENCOUNTER — Ambulatory Visit: Payer: Medicare Other | Admitting: Family

## 2013-11-27 ENCOUNTER — Other Ambulatory Visit (HOSPITAL_COMMUNITY): Payer: Medicare Other

## 2013-12-25 ENCOUNTER — Encounter: Payer: Self-pay | Admitting: Family

## 2013-12-26 ENCOUNTER — Ambulatory Visit (HOSPITAL_COMMUNITY)
Admission: RE | Admit: 2013-12-26 | Discharge: 2013-12-26 | Disposition: A | Discharge status: Home / Self Care | Payer: Medicare Other | Source: Ambulatory Visit | Attending: Family | Admitting: Family

## 2013-12-26 ENCOUNTER — Encounter: Payer: Self-pay | Admitting: Family

## 2013-12-26 ENCOUNTER — Ambulatory Visit (INDEPENDENT_AMBULATORY_CARE_PROVIDER_SITE_OTHER): Payer: Medicare Other | Admitting: Family

## 2013-12-26 VITALS — BP 135/75 | HR 64 | Resp 14 | Ht 69.0 in | Wt 269.0 lb

## 2013-12-26 DIAGNOSIS — I6529 Occlusion and stenosis of unspecified carotid artery: Secondary | ICD-10-CM

## 2013-12-26 NOTE — Progress Notes (Signed)
Established Carotid Patient   History of Present Illness  Kyle Lewis is a 68 y.o. male patient of Dr. Oneida Alar who presents for evaluation of carotid stenosis. The patient was recently admitted to [facility] for a stroke workup in 2014, as manifested by numbness of lips that lasted a couple of weeks, and still has some residual numbness. He also has intermitent left radial nerve distribution tingling and numbness which started with his stroke event in 2014.   This showed occlusion of the right internal carotid artery. At that time the left internal carotid artery also had some stenosis. He had some tingling in his left fingertips and left lip at that time. This has improved somewhat. The patient denies any prior symptoms of TIA, amaurosis, or stroke. The patient is currently on Plavix antiplatelet therapy. Other medical problems include sleep apnea requiring CPAP, hypertension, COPD. These are currently stable. He also has a history of tobacco abuse. He currently smokes about one pack of cigarettes per day.  He has a history of meningioma since 1991 with accompanying expressive aphasia and right hand weakness, resolved with the craniotomy in 1991.  Patient has not had previous carotid artery intervention.  Pt  reports New Medical or Surgical History: left knee replacement. He is a Engineer, drilling.  Pt Diabetic: No Pt smoker: smoker  (3/4 ppd, decreased from 2 ppd, started at age 3 yrs)  Pt meds include: Statin : No, states his cholesterol is good ASA: No Other anticoagulants/antiplatelets: Plavix   Past Medical History  Diagnosis Date  . Diverticulosis   . Adenomatous colon polyp   . Sleep apnea     wears CPAP  . Allergy     seasonal  . Arthritis   . Cataract   . Hypertension   . Nocturia   . BPH (benign prostatic hyperplasia)   . Adrenal mass   . Chronic back pain   . CVA (cerebral infarction)   . COPD (chronic obstructive pulmonary disease)   . Peripheral vascular  disease   . Stroke   . Carotid artery occlusion     Social History History  Substance Use Topics  . Smoking status: Current Every Day Smoker -- 0.50 packs/day for 20 years    Types: Cigarettes  . Smokeless tobacco: Never Used     Comment: pt states he is trying to quit  . Alcohol Use: Yes     Comment: rare    Family History Family History  Problem Relation Age of Onset  . Prostate cancer Maternal Grandfather   . Colon cancer Neg Hx   . Esophageal cancer Neg Hx   . Rectal cancer Neg Hx   . Stomach cancer Neg Hx   . Heart disease Mother   . Hypertension Mother   . Heart attack Mother   . Diabetes Father   . Heart attack Father   . Heart disease Father     NOT  before age 87- [facility]    Surgical History Past Surgical History  Procedure Laterality Date  . Umbilical hernia repair  2011  . Tonsillectomy    . Appendectomy    . Brain meningioma excision    . Carpal tunnel release      left  . Cataracts      both eyes  . Inguinal hernia repair    . Transurethral resection of prostate    . Total knee arthroplasty  04/01/2012    Procedure: TOTAL KNEE ARTHROPLASTY;  Surgeon: Gearlean Alf, MD;  Location: [facility];  Service: Orthopedics;  Laterality: Left;  . Joint replacement Left 04-01-12    Knee  . Eye surgery Bilateral     Cataract    No Known Allergies  Current Outpatient Prescriptions  Medication Sig Dispense Refill  . B Complex-C (B-COMPLEX WITH VITAMIN C) tablet Take 1 tablet by mouth daily.      . Cholecalciferol (VITAMIN D-3) 5000 UNITS TABS Take 1 tablet by mouth daily.      . clopidogrel (PLAVIX) 75 MG tablet Take 1 tablet (75 mg total) by mouth daily.  90 tablet  1  . finasteride (PROSCAR) 5 MG tablet Take 5 mg by mouth daily.      . mirabegron ER (MYRBETRIQ) 50 MG TB24 tablet Take 50 mg by mouth daily.      . Multiple Vitamin (MULTIVITAMIN WITH MINERALS) TABS Take 1 tablet by mouth daily.      Marland Kitchen omega-3 acid ethyl esters (LOVAZA) 1 G capsule Take 2 g by  mouth 2 (two) times daily.      Marland Kitchen oxyCODONE (OXY IR/ROXICODONE) 5 MG immediate release tablet Take 10 mg by mouth as needed. Once per night      . Tamsulosin HCl (FLOMAX) 0.4 MG CAPS Take 0.4 mg by mouth daily after supper.       . losartan-hydrochlorothiazide (HYZAAR) 100-25 MG per tablet Take 1 tablet by mouth every morning.      . vitamin C (ASCORBIC ACID) 500 MG tablet Take 500 mg by mouth daily.       No current facility-administered medications for this visit.    Review of Systems : See HPI for pertinent positives and negatives.  Physical Examination  2 Filed Vitals:   12/26/13 1430 12/26/13 1432  BP: 142/76 135/75  Pulse: 64 64  Resp:  14  Height:  5\' 9"  (1.753 m)  Weight:  269 lb (122.018 kg)  SpO2:  95%   Body mass index is 39.71 kg/(m^2).  General: WDWN obese male in NAD GAIT: normal Eyes: PERRLA Pulmonary:  Non-labored, CTAB, Negative  Rales, Negative rhonchi, & Negative wheezing.  Cardiac: regular Rhythm ,  Negative detected murmur.  VASCULAR EXAM Carotid Bruits Left Right   Negative Negative     Radial pulses are 2+ palpable and equal.                                                                                                                       Gastrointestinal: soft, nontender, BS WNL, no r/g,  negative masses.  Musculoskeletal: Negative muscle atrophy/wasting. M/S 5/5 throughout, Extremities without ischemic changes.  Neurologic: A&O X 3; Appropriate Affect ; SENSATION ;normal;  Speech is normal CN 2-12 intact, Pain and light touch intact in extremities, Motor exam as listed above.   Non-Invasive Vascular Imaging CAROTID DUPLEX 12/26/2013   CEREBROVASCULAR DUPLEX EVALUATION    INDICATION: Carotid artery stenosis    PREVIOUS INTERVENTION(S):     DUPLEX EXAM:     RIGHT  LEFT  Peak  Systolic Velocities (cm/s) End Diastolic Velocities (cm/s) Plaque LOCATION Peak Systolic Velocities (cm/s) End Diastolic Velocities (cm/s) Plaque  64 8  CCA  PROXIMAL 76 21   34 O HT CCA MID 45 14 HT  33 5  CCA DISTAL 53 15 HT  111 15 HT ECA 104 11   0  HT ICA PROXIMAL 42 14 HT  0  HT ICA MID 87 33   0  HT ICA DISTAL 95 38     N/A ICA / CCA Ratio (PSV) .93  Antegrade Vertebral Flow Antegrade  409 Brachial Systolic Pressure (mmHg) 811  Triphasic Brachial Artery Waveforms Triphasic    Plaque Morphology:  HM = Homogeneous, HT = Heterogeneous, CP = Calcific Plaque, SP = Smooth Plaque, IP = Irregular Plaque     ADDITIONAL FINDINGS:     IMPRESSION: 1.  Occlusion of the right internal carotid artery.   2. <40% left internal carotid artery stenosis.    Compared to the previous exam:  No significant change since the previous exam performed on 11-21-2012.    Assessment: Kyle Lewis is a 68 y.o. male who presents with asymptomatic occluded Right ICA and <40% left ICA Stenosis. The  ICA stenosis is  Unchanged from previous exam. He does not have DM but unfortunately continues to smoke and is obese (atherosclerotic risk factors).   Plan: Follow-up in 6 months with Carotid Duplex scan. He was again counseled re smoking cessation.   I discussed in depth with the patient the nature of atherosclerosis, and emphasized the importance of maximal medical management including strict control of blood pressure, blood glucose, and lipid levels, obtaining regular exercise, and cessation of smoking.  The patient is aware that without maximal medical management the underlying atherosclerotic disease process will progress, limiting the benefit of any interventions. The patient was given information about stroke prevention and what symptoms should prompt the patient to seek immediate medical care. Thank you for allowing [place] to participate in this patient's care.  Clemon Chambers, RN, MSN, FNP-C [facility] Office: [phone]  Clinic Physician: Bridgett Larsson  12/26/2013 2:52 PM

## 2013-12-26 NOTE — Patient Instructions (Signed)
Stroke Prevention Some medical conditions and behaviors are associated with an increased chance of having a stroke. You may prevent a stroke by making healthy choices and managing medical conditions. HOW CAN I REDUCE MY RISK OF HAVING A STROKE?   Stay physically active. Get at least 30 minutes of activity on most or all days.  Do not smoke. It may also be helpful to avoid exposure to secondhand smoke.  Limit alcohol use. Moderate alcohol use is considered to be:  No more than 2 drinks per day for men.  No more than 1 drink per day for nonpregnant women.  Eat healthy foods. This involves:  Eating 5 or more servings of fruits and vegetables a day.  Making dietary changes that address high blood pressure (hypertension), high cholesterol, diabetes, or obesity.  Manage your cholesterol levels.  Making food choices that are high in fiber and low in saturated fat, trans fat, and cholesterol may control cholesterol levels.  Take any prescribed medicines to control cholesterol as directed by your health care provider.  Manage your diabetes.  Controlling your carbohydrate and sugar intake is recommended to manage diabetes.  Take any prescribed medicines to control diabetes as directed by your health care provider.  Control your hypertension.  Making food choices that are low in salt (sodium), saturated fat, trans fat, and cholesterol is recommended to manage hypertension.  Take any prescribed medicines to control hypertension as directed by your health care provider.  Maintain a healthy weight.  Reducing calorie intake and making food choices that are low in sodium, saturated fat, trans fat, and cholesterol are recommended to manage weight.  Stop drug abuse.  Avoid taking birth control pills.  Talk to your health care provider about the risks of taking birth control pills if you are over 35 years old, smoke, get migraines, or have ever had a blood clot.  Get evaluated for sleep  disorders (sleep apnea).  Talk to your health care provider about getting a sleep evaluation if you snore a lot or have excessive sleepiness.  Take medicines only as directed by your health care provider.  For some people, aspirin or blood thinners (anticoagulants) are helpful in reducing the risk of forming abnormal blood clots that can lead to stroke. If you have the irregular heart rhythm of atrial fibrillation, you should be on a blood thinner unless there is a good reason you cannot take them.  Understand all your medicine instructions.  Make sure that other conditions (such as anemia or atherosclerosis) are addressed. SEEK IMMEDIATE MEDICAL CARE IF:   You have sudden weakness or numbness of the face, arm, or leg, especially on one side of the body.  Your face or eyelid droops to one side.  You have sudden confusion.  You have trouble speaking (aphasia) or understanding.  You have sudden trouble seeing in one or both eyes.  You have sudden trouble walking.  You have dizziness.  You have a loss of balance or coordination.  You have a sudden, severe headache with no known cause.  You have new chest pain or an irregular heartbeat. Any of these symptoms may represent a serious problem that is an emergency. Do not wait to see if the symptoms will go away. Get medical help at once. Call your local emergency services (911 in [place] Do not drive yourself to the hospital. Document Released: 06/29/2004 Document Revised: 10/06/2013 Document Reviewed: 11/22/2012 ExitCare Patient Information 2015 [employer]. This information is not intended to replace advice given   to you by your health care provider. Make sure you discuss any questions you have with your health care provider.   Smoking Cessation Quitting smoking is important to your health and has many advantages. However, it is not always easy to quit since nicotine is a very addictive drug. Oftentimes, people try 3 times or more  before being able to quit. This document explains the best ways for you to prepare to quit smoking. Quitting takes hard work and a lot of effort, but you can do it. ADVANTAGES OF QUITTING SMOKING  You will live longer, feel better, and live better.  Your body will feel the impact of quitting smoking almost immediately.  Within 20 minutes, blood pressure decreases. Your pulse returns to its normal level.  After 8 hours, carbon monoxide levels in the blood return to normal. Your oxygen level increases.  After 24 hours, the chance of having a heart attack starts to decrease. Your breath, hair, and body stop smelling like smoke.  After 48 hours, damaged nerve endings begin to recover. Your sense of taste and smell improve.  After 72 hours, the body is virtually free of nicotine. Your bronchial tubes relax and breathing becomes easier.  After 2 to 12 weeks, lungs can hold more air. Exercise becomes easier and circulation improves.  The risk of having a heart attack, stroke, cancer, or lung disease is greatly reduced.  After 1 year, the risk of coronary heart disease is cut in half.  After 5 years, the risk of stroke falls to the same as a nonsmoker.  After 10 years, the risk of lung cancer is cut in half and the risk of other cancers decreases significantly.  After 15 years, the risk of coronary heart disease drops, usually to the level of a nonsmoker.  If you are pregnant, quitting smoking will improve your chances of having a healthy baby.  The people you live with, especially any children, will be healthier.  You will have extra money to spend on things other than cigarettes. QUESTIONS TO THINK ABOUT BEFORE ATTEMPTING TO QUIT You may want to talk about your answers with your health care provider.  Why do you want to quit?  If you tried to quit in the past, what helped and what did not?  What will be the most difficult situations for you after you quit? How will you plan to  handle them?  Who can help you through the tough times? Your family? Friends? A health care provider?  What pleasures do you get from smoking? What ways can you still get pleasure if you quit? Here are some questions to ask your health care provider:  How can you help me to be successful at quitting?  What medicine do you think would be best for me and how should I take it?  What should I do if I need more help?  What is smoking withdrawal like? How can I get information on withdrawal? GET READY  Set a quit date.  Change your environment by getting rid of all cigarettes, ashtrays, matches, and lighters in your home, car, or work. Do not let people smoke in your home.  Review your past attempts to quit. Think about what worked and what did not. GET SUPPORT AND ENCOURAGEMENT You have a better chance of being successful if you have help. You can get support in many ways.  Tell your family, friends, and coworkers that you are going to quit and need their support. Ask them not   to smoke around you.  Get individual, group, or telephone counseling and support. Programs are available at local hospitals and health centers. Call your local health department for information about programs in your area.  Spiritual beliefs and practices may help some smokers quit.  Download a "quit meter" on your computer to keep track of quit statistics, such as how long you have gone without smoking, cigarettes not smoked, and money saved.  Get a self-help book about quitting smoking and staying off tobacco. LEARN NEW SKILLS AND BEHAVIORS  Distract yourself from urges to smoke. Talk to someone, go for a walk, or occupy your time with a task.  Change your normal routine. Take a different route to work. Drink tea instead of coffee. Eat breakfast in a different place.  Reduce your stress. Take a hot bath, exercise, or read a book.  Plan something enjoyable to do every day. Reward yourself for not  smoking.  Explore interactive web-based programs that specialize in helping you quit. GET MEDICINE AND USE IT CORRECTLY Medicines can help you stop smoking and decrease the urge to smoke. Combining medicine with the above behavioral methods and support can greatly increase your chances of successfully quitting smoking.  Nicotine replacement therapy helps deliver nicotine to your body without the negative effects and risks of smoking. Nicotine replacement therapy includes nicotine gum, lozenges, inhalers, nasal sprays, and skin patches. Some may be available over-the-counter and others require a prescription.  Antidepressant medicine helps people abstain from smoking, but how this works is unknown. This medicine is available by prescription.  Nicotinic receptor partial agonist medicine simulates the effect of nicotine in your brain. This medicine is available by prescription. Ask your health care provider for advice about which medicines to use and how to use them based on your health history. Your health care provider will tell you what side effects to look out for if you choose to be on a medicine or therapy. Carefully read the information on the package. Do not use any other product containing nicotine while using a nicotine replacement product.  RELAPSE OR DIFFICULT SITUATIONS Most relapses occur within the first 3 months after quitting. Do not be discouraged if you start smoking again. Remember, most people try several times before finally quitting. You may have symptoms of withdrawal because your body is used to nicotine. You may crave cigarettes, be irritable, feel very hungry, cough often, get headaches, or have difficulty concentrating. The withdrawal symptoms are only temporary. They are strongest when you first quit, but they will go away within 10-14 days. To reduce the chances of relapse, try to:  Avoid drinking alcohol. Drinking lowers your chances of successfully quitting.  Reduce the  amount of caffeine you consume. Once you quit smoking, the amount of caffeine in your body increases and can give you symptoms, such as a rapid heartbeat, sweating, and anxiety.  Avoid smokers because they can make you want to smoke.  Do not let weight gain distract you. Many smokers will gain weight when they quit, usually less than 10 pounds. Eat a healthy diet and stay active. You can always lose the weight gained after you quit.  Find ways to improve your mood other than smoking. FOR MORE INFORMATION  www.smokefree.gov  Document Released: 05/16/2001 Document Revised: 10/06/2013 Document Reviewed: 08/31/2011 ExitCare Patient Information 2015 [employer]. This information is not intended to replace advice given to you by your health care provider. Make sure you discuss any questions you have with your health care   provider.  

## 2013-12-29 NOTE — Addendum Note (Signed)
Addended by: Mena Goes on: 12/29/2013 09:32 AM   Modules accepted: Orders

## 2014-01-01 DIAGNOSIS — I639 Cerebral infarction, unspecified: Secondary | ICD-10-CM | POA: Insufficient documentation

## 2014-01-19 DIAGNOSIS — N3281 Overactive bladder: Secondary | ICD-10-CM

## 2014-01-19 HISTORY — DX: Overactive bladder: N32.81

## 2014-04-22 ENCOUNTER — Encounter: Payer: Self-pay | Admitting: Neurology

## 2014-04-28 ENCOUNTER — Encounter: Payer: Self-pay | Admitting: Neurology

## 2014-05-18 ENCOUNTER — Ambulatory Visit: Payer: Medicare Other | Admitting: Nurse Practitioner

## 2014-06-09 ENCOUNTER — Other Ambulatory Visit: Payer: Self-pay | Admitting: Nurse Practitioner

## 2014-06-10 ENCOUNTER — Other Ambulatory Visit: Payer: Self-pay | Admitting: Nurse Practitioner

## 2014-07-01 ENCOUNTER — Encounter: Payer: Self-pay | Admitting: Family

## 2014-07-02 ENCOUNTER — Ambulatory Visit (INDEPENDENT_AMBULATORY_CARE_PROVIDER_SITE_OTHER): Payer: Medicare Other | Admitting: Family

## 2014-07-02 ENCOUNTER — Ambulatory Visit (HOSPITAL_COMMUNITY)
Admission: RE | Admit: 2014-07-02 | Discharge: 2014-07-02 | Disposition: A | Discharge status: Home / Self Care | Payer: Medicare Other | Source: Ambulatory Visit | Attending: Family | Admitting: Family

## 2014-07-02 ENCOUNTER — Encounter: Payer: Self-pay | Admitting: Family

## 2014-07-02 VITALS — BP 129/75 | HR 63 | Resp 16 | Ht 67.0 in | Wt 262.0 lb

## 2014-07-02 DIAGNOSIS — Z720 Tobacco use: Secondary | ICD-10-CM

## 2014-07-02 DIAGNOSIS — I6523 Occlusion and stenosis of bilateral carotid arteries: Secondary | ICD-10-CM | POA: Insufficient documentation

## 2014-07-02 DIAGNOSIS — F17200 Nicotine dependence, unspecified, uncomplicated: Secondary | ICD-10-CM

## 2014-07-02 DIAGNOSIS — I6521 Occlusion and stenosis of right carotid artery: Secondary | ICD-10-CM

## 2014-07-02 NOTE — Progress Notes (Signed)
Established Carotid Patient   History of Present Illness  Kyle Lewis is a 69 y.o. male patient of Dr. Oneida Alar who presents for follow up of carotid stenosis. He has a known occlusion of the right internal carotid artery. The patient was admitted to [facility] for a stroke workup in 2014, as manifested by numbness of lips that lasted a couple of weeks, and still has some residual numbness. He also has intermitent left radial nerve distribution tingling and numbness which started with his stroke event in 2014.  The patient is currently on Plavix antiplatelet therapy.  Other medical problems include sleep apnea requiring CPAP, hypertension, COPD. These are currently stable.  He has a history of meningioma since 1991 with accompanying expressive aphasia and right hand weakness, resolved with the craniotomy in 1991.  Patient has not had previous carotid artery intervention.  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  Pt Diabetic: No Pt smoker: smoker (3/4 ppd, decreased from 2 ppd, started at age 79 yrs)  Pt meds include: Statin : No, states his cholesterol is good ASA: No Other anticoagulants/antiplatelets: Plavix   Past Medical History  Diagnosis Date  . Diverticulosis   . Adenomatous colon polyp   . Sleep apnea     wears CPAP  . Allergy     seasonal  . Arthritis   . Cataract   . Hypertension   . Nocturia   . BPH (benign prostatic hyperplasia)   . Adrenal mass   . Chronic back pain   . CVA (cerebral infarction)   . COPD (chronic obstructive pulmonary disease)   . Peripheral vascular disease   . Stroke   . Carotid artery occlusion     Social History History  Substance Use Topics  . Smoking status: Light Tobacco Smoker -- 0.50 packs/day for 20 years    Types: Cigarettes  . Smokeless tobacco: Never Used     Comment: pt states he is trying to quit  .  Alcohol Use: Yes     Comment: rare    Family History Family History  Problem Relation Age of Onset  . Prostate cancer Maternal Grandfather   . Colon cancer Neg Hx   . Esophageal cancer Neg Hx   . Rectal cancer Neg Hx   . Stomach cancer Neg Hx   . Heart disease Mother     After 65 yrs of age  . Hypertension Mother   . Heart attack Mother   . Hyperlipidemia Mother   . Diabetes Father   . Heart attack Father   . Heart disease Father     NOT  before age 47- [facility]    Surgical History Past Surgical History  Procedure Laterality Date  . Umbilical hernia repair  2011  . Tonsillectomy    . Appendectomy    . Brain meningioma excision    . Carpal tunnel release      left  . Cataracts      both eyes  . Inguinal hernia repair    . Transurethral resection of prostate    . Total knee arthroplasty  04/01/2012    Procedure: TOTAL KNEE ARTHROPLASTY;  Surgeon: Gearlean Alf, MD;  Location: [facility];  Service: Orthopedics;  Laterality: Left;  . Joint replacement Left 04-01-12    Knee  . Eye surgery Bilateral     Cataract  . Prostate surgery  No Known Allergies  Current Outpatient Prescriptions  Medication Sig Dispense Refill  . B Complex-C (B-COMPLEX WITH VITAMIN C) tablet Take 1 tablet by mouth daily.    . Cholecalciferol (VITAMIN D-3) 5000 UNITS TABS Take 1 tablet by mouth daily.    . clopidogrel (PLAVIX) 75 MG tablet TAKE 1 TABLET (75 MG TOTAL) BY MOUTH DAILY. 90 tablet 0  . clopidogrel (PLAVIX) 75 MG tablet TAKE 1 TABLET (75 MG TOTAL) BY MOUTH DAILY. 90 tablet 0  . finasteride (PROSCAR) 5 MG tablet Take 5 mg by mouth daily.    Marland Kitchen losartan-hydrochlorothiazide (HYZAAR) 100-25 MG per tablet Take 1 tablet by mouth every morning.    . Multiple Vitamin (MULTIVITAMIN WITH MINERALS) TABS Take 1 tablet by mouth daily.    Marland Kitchen omega-3 acid ethyl esters (LOVAZA) 1 G capsule Take 2 g by mouth 2 (two) times daily.    Marland Kitchen oxyCODONE (OXY IR/ROXICODONE) 5 MG immediate release tablet Take 10 mg  by mouth as needed. Once per night    . Tamsulosin HCl (FLOMAX) 0.4 MG CAPS Take 0.4 mg by mouth daily after supper.     . vitamin C (ASCORBIC ACID) 500 MG tablet Take 500 mg by mouth daily.    . mirabegron ER (MYRBETRIQ) 50 MG TB24 tablet Take 50 mg by mouth daily.     No current facility-administered medications for this visit.    Review of Systems : See HPI for pertinent positives and negatives.  Physical Examination  Filed Vitals:   07/02/14 1606 07/02/14 1610  BP: 131/72 129/75  Pulse: 64 63  Resp:  16  Height:  5\' 7"  (1.702 m)  Weight:  262 lb (118.842 kg)  SpO2:  95%   Body mass index is 41.03 kg/(m^2).  WDWN morbidly obese male in NAD GAIT: normal Eyes: PERRLA Pulmonary: Non-labored, CTAB, Negative Rales, Negative rhonchi, & Negative wheezing.  Cardiac: regular Rhythm, no detected murmur.  VASCULAR EXAM Carotid Bruits Left Right   Negative Negative  Aorta is not palpable.  Radial pulses are 2+ palpable and equal. Dorsalis pedis pulses are palpable.     Gastrointestinal: soft, nontender, BS WNL, no r/g,ventral hernia present and reducible s/p repair.  Musculoskeletal: Negative muscle atrophy/wasting. M/S 5/5 throughout, Extremities without ischemic changes.  Neurologic: A&O X 3; Appropriate Affect;  Speech is normal CN 2-12 intact, Pain and light touch intact in extremities, Motor exam as listed above.     Non-Invasive Vascular Imaging CAROTID DUPLEX 07/02/2014   CEREBROVASCULAR DUPLEX EVALUATION    INDICATION: Carotid disease    PREVIOUS INTERVENTION(S):     DUPLEX EXAM:     RIGHT  LEFT  Peak Systolic Velocities (cm/s) End Diastolic Velocities (cm/s) Plaque LOCATION Peak Systolic Velocities (cm/s) End Diastolic Velocities (cm/s) Plaque  59 7  CCA PROXIMAL 77 20   34 6  CCA MID 55 14   35 6 HT CCA DISTAL 42 12  HT  90 10 HT ECA 102 10   Occluded  HT ICA PROXIMAL 106 29 HT  Occluded  HT ICA MID 65 21   Occluded  HT ICA DISTAL 88 27     Not Calculated ICA / CCA Ratio (PSV) 2.5  Antegrade Vertebral Flow Antegrade   Brachial Systolic Pressure (mmHg)   Multiphasic (subclavian artery) Brachial Artery Waveforms Multiphasic (subclavian artery)    Plaque Morphology:  HM = Homogeneous, HT = Heterogeneous, CP = Calcific Plaque, SP = Smooth Plaque, IP = Irregular Plaque     ADDITIONAL FINDINGS: No significant  stenosis of the bilateral external or common carotid arteries.    IMPRESSION: 1. Known occlusion of the right internal carotid artery noted. 2. Doppler velocities suggest a less than 40% stenosis of the left proximal internal carotid artery.    Compared to the previous exam:  No significant change noted when compared to the previous exam on 12/26/13.         Assessment: Stpehen Petitjean is a 69 y.o. male who presents with asymptomatic known occlusion of the right internal carotid artery and  less than 40% stenosis of the left proximal internal carotid artery. No significant change noted when compared to the previous exam on 12/26/13.   His atherosclerotic risk factors include smoking and morbid obesity.   Face to face time with patient was 15 minutes. Over 50% of this time was spent on counseling and coordination of care.    Plan: Follow-up in 1 year with Carotid Duplex.   The patient was counseled re smoking cessation and given several free resources re smoking cessation.   I discussed in depth with the patient the nature of atherosclerosis, and emphasized the importance of maximal medical management including strict control of blood pressure, blood glucose, and lipid levels, obtaining regular exercise, and cessation of smoking.  The patient is aware that without maximal medical management the underlying atherosclerotic disease process will progress, limiting the benefit of any  interventions. The patient was given information about stroke prevention and what symptoms should prompt the patient to seek immediate medical care. Thank you for allowing [place] to participate in this patient's care.  Clemon Chambers, RN, MSN, FNP-C [facility] Office: [phone]  Clinic Physician: Oneida Alar  07/02/2014  4:33 PM

## 2014-07-02 NOTE — Patient Instructions (Signed)
Stroke Prevention Some medical conditions and behaviors are associated with an increased chance of having a stroke. You may prevent a stroke by making healthy choices and managing medical conditions. HOW CAN I REDUCE MY RISK OF HAVING A STROKE?   Stay physically active. Get at least 30 minutes of activity on most or all days.  Do not smoke. It may also be helpful to avoid exposure to secondhand smoke.  Limit alcohol use. Moderate alcohol use is considered to be:  No more than 2 drinks per day for men.  No more than 1 drink per day for nonpregnant women.  Eat healthy foods. This involves:  Eating 5 or more servings of fruits and vegetables a day.  Making dietary changes that address high blood pressure (hypertension), high cholesterol, diabetes, or obesity.  Manage your cholesterol levels.  Making food choices that are high in fiber and low in saturated fat, trans fat, and cholesterol may control cholesterol levels.  Take any prescribed medicines to control cholesterol as directed by your health care provider.  Manage your diabetes.  Controlling your carbohydrate and sugar intake is recommended to manage diabetes.  Take any prescribed medicines to control diabetes as directed by your health care provider.  Control your hypertension.  Making food choices that are low in salt (sodium), saturated fat, trans fat, and cholesterol is recommended to manage hypertension.  Take any prescribed medicines to control hypertension as directed by your health care provider.  Maintain a healthy weight.  Reducing calorie intake and making food choices that are low in sodium, saturated fat, trans fat, and cholesterol are recommended to manage weight.  Stop drug abuse.  Avoid taking birth control pills.  Talk to your health care provider about the risks of taking birth control pills if you are over 35 years old, smoke, get migraines, or have ever had a blood clot.  Get evaluated for sleep  disorders (sleep apnea).  Talk to your health care provider about getting a sleep evaluation if you snore a lot or have excessive sleepiness.  Take medicines only as directed by your health care provider.  For some people, aspirin or blood thinners (anticoagulants) are helpful in reducing the risk of forming abnormal blood clots that can lead to stroke. If you have the irregular heart rhythm of atrial fibrillation, you should be on a blood thinner unless there is a good reason you cannot take them.  Understand all your medicine instructions.  Make sure that other conditions (such as anemia or atherosclerosis) are addressed. SEEK IMMEDIATE MEDICAL CARE IF:   You have sudden weakness or numbness of the face, arm, or leg, especially on one side of the body.  Your face or eyelid droops to one side.  You have sudden confusion.  You have trouble speaking (aphasia) or understanding.  You have sudden trouble seeing in one or both eyes.  You have sudden trouble walking.  You have dizziness.  You have a loss of balance or coordination.  You have a sudden, severe headache with no known cause.  You have new chest pain or an irregular heartbeat. Any of these symptoms may represent a serious problem that is an emergency. Do not wait to see if the symptoms will go away. Get medical help at once. Call your local emergency services (911 in [place] Do not drive yourself to the hospital. Document Released: 06/29/2004 Document Revised: 10/06/2013 Document Reviewed: 11/22/2012 [employer] Patient Information 2015 [employer]. This information is not intended to replace advice given   to you by your health care provider. Make sure you discuss any questions you have with your health care provider.    Smoking Cessation Quitting smoking is important to your health and has many advantages. However, it is not always easy to quit since nicotine is a very addictive drug. Oftentimes, people try 3 times or  more before being able to quit. This document explains the best ways for you to prepare to quit smoking. Quitting takes hard work and a lot of effort, but you can do it. ADVANTAGES OF QUITTING SMOKING  You will live longer, feel better, and live better.  Your body will feel the impact of quitting smoking almost immediately.  Within 20 minutes, blood pressure decreases. Your pulse returns to its normal level.  After 8 hours, carbon monoxide levels in the blood return to normal. Your oxygen level increases.  After 24 hours, the chance of having a heart attack starts to decrease. Your breath, hair, and body stop smelling like smoke.  After 48 hours, damaged nerve endings begin to recover. Your sense of taste and smell improve.  After 72 hours, the body is virtually free of nicotine. Your bronchial tubes relax and breathing becomes easier.  After 2 to 12 weeks, lungs can hold more air. Exercise becomes easier and circulation improves.  The risk of having a heart attack, stroke, cancer, or lung disease is greatly reduced.  After 1 year, the risk of coronary heart disease is cut in half.  After 5 years, the risk of stroke falls to the same as a nonsmoker.  After 10 years, the risk of lung cancer is cut in half and the risk of other cancers decreases significantly.  After 15 years, the risk of coronary heart disease drops, usually to the level of a nonsmoker.  If you are pregnant, quitting smoking will improve your chances of having a healthy baby.  The people you live with, especially any children, will be healthier.  You will have extra money to spend on things other than cigarettes. QUESTIONS TO THINK ABOUT BEFORE ATTEMPTING TO QUIT You may want to talk about your answers with your health care provider.  Why do you want to quit?  If you tried to quit in the past, what helped and what did not?  What will be the most difficult situations for you after you quit? How will you plan to  handle them?  Who can help you through the tough times? Your family? Friends? A health care provider?  What pleasures do you get from smoking? What ways can you still get pleasure if you quit? Here are some questions to ask your health care provider:  How can you help me to be successful at quitting?  What medicine do you think would be best for me and how should I take it?  What should I do if I need more help?  What is smoking withdrawal like? How can I get information on withdrawal? GET READY  Set a quit date.  Change your environment by getting rid of all cigarettes, ashtrays, matches, and lighters in your home, car, or work. Do not let people smoke in your home.  Review your past attempts to quit. Think about what worked and what did not. GET SUPPORT AND ENCOURAGEMENT You have a better chance of being successful if you have help. You can get support in many ways.  Tell your family, friends, and coworkers that you are going to quit and need their support. Ask them   not to smoke around you.  Get individual, group, or telephone counseling and support. Programs are available at local hospitals and health centers. Call your local health department for information about programs in your area.  Spiritual beliefs and practices may help some smokers quit.  Download a "quit meter" on your computer to keep track of quit statistics, such as how long you have gone without smoking, cigarettes not smoked, and money saved.  Get a self-help book about quitting smoking and staying off tobacco. LEARN NEW SKILLS AND BEHAVIORS  Distract yourself from urges to smoke. Talk to someone, go for a walk, or occupy your time with a task.  Change your normal routine. Take a different route to work. Drink tea instead of coffee. Eat breakfast in a different place.  Reduce your stress. Take a hot bath, exercise, or read a book.  Plan something enjoyable to do every day. Reward yourself for not  smoking.  Explore interactive web-based programs that specialize in helping you quit. GET MEDICINE AND USE IT CORRECTLY Medicines can help you stop smoking and decrease the urge to smoke. Combining medicine with the above behavioral methods and support can greatly increase your chances of successfully quitting smoking.  Nicotine replacement therapy helps deliver nicotine to your body without the negative effects and risks of smoking. Nicotine replacement therapy includes nicotine gum, lozenges, inhalers, nasal sprays, and skin patches. Some may be available over-the-counter and others require a prescription.  Antidepressant medicine helps people abstain from smoking, but how this works is unknown. This medicine is available by prescription.  Nicotinic receptor partial agonist medicine simulates the effect of nicotine in your brain. This medicine is available by prescription. Ask your health care provider for advice about which medicines to use and how to use them based on your health history. Your health care provider will tell you what side effects to look out for if you choose to be on a medicine or therapy. Carefully read the information on the package. Do not use any other product containing nicotine while using a nicotine replacement product.  RELAPSE OR DIFFICULT SITUATIONS Most relapses occur within the first 3 months after quitting. Do not be discouraged if you start smoking again. Remember, most people try several times before finally quitting. You may have symptoms of withdrawal because your body is used to nicotine. You may crave cigarettes, be irritable, feel very hungry, cough often, get headaches, or have difficulty concentrating. The withdrawal symptoms are only temporary. They are strongest when you first quit, but they will go away within 10-14 days. To reduce the chances of relapse, try to:  Avoid drinking alcohol. Drinking lowers your chances of successfully quitting.  Reduce the  amount of caffeine you consume. Once you quit smoking, the amount of caffeine in your body increases and can give you symptoms, such as a rapid heartbeat, sweating, and anxiety.  Avoid smokers because they can make you want to smoke.  Do not let weight gain distract you. Many smokers will gain weight when they quit, usually less than 10 pounds. Eat a healthy diet and stay active. You can always lose the weight gained after you quit.  Find ways to improve your mood other than smoking. FOR MORE INFORMATION  www.smokefree.gov  Document Released: 05/16/2001 Document Revised: 10/06/2013 Document Reviewed: 08/31/2011 [employer] Patient Information 2015 [employer]. This information is not intended to replace advice given to you by your health care provider. Make sure you discuss any questions you have with your health   care provider.    Smoking Cessation, Tips for Success If you are ready to quit smoking, congratulations! You have chosen to help yourself be healthier. Cigarettes bring nicotine, tar, carbon monoxide, and other irritants into your body. Your lungs, heart, and blood vessels will be able to work better without these poisons. There are many different ways to quit smoking. Nicotine gum, nicotine patches, a nicotine inhaler, or nicotine nasal spray can help with physical craving. Hypnosis, support groups, and medicines help break the habit of smoking. WHAT THINGS CAN I DO TO MAKE QUITTING EASIER?  Here are some tips to help you quit for good:  Pick a date when you will quit smoking completely. Tell all of your friends and family about your plan to quit on that date.  Do not try to slowly cut down on the number of cigarettes you are smoking. Pick a quit date and quit smoking completely starting on that day.  Throw away all cigarettes.   Clean and remove all ashtrays from your home, work, and car.  On a card, write down your reasons for quitting. Carry the card with you and read it  when you get the urge to smoke.  Cleanse your body of nicotine. Drink enough water and fluids to keep your urine clear or pale yellow. Do this after quitting to flush the nicotine from your body.  Learn to predict your moods. Do not let a bad situation be your excuse to have a cigarette. Some situations in your life might tempt you into wanting a cigarette.  Never have "just one" cigarette. It leads to wanting another and another. Remind yourself of your decision to quit.  Change habits associated with smoking. If you smoked while driving or when feeling stressed, try other activities to replace smoking. Stand up when drinking your coffee. Brush your teeth after eating. Sit in a different chair when you read the paper. Avoid alcohol while trying to quit, and try to drink fewer caffeinated beverages. Alcohol and caffeine may urge you to smoke.  Avoid foods and drinks that can trigger a desire to smoke, such as sugary or spicy foods and alcohol.  Ask people who smoke not to smoke around you.  Have something planned to do right after eating or having a cup of coffee. For example, plan to take a walk or exercise.  Try a relaxation exercise to calm you down and decrease your stress. Remember, you may be tense and nervous for the first 2 weeks after you quit, but this will pass.  Find new activities to keep your hands busy. Play with a pen, coin, or rubber band. Doodle or draw things on paper.  Brush your teeth right after eating. This will help cut down on the craving for the taste of tobacco after meals. You can also try mouthwash.   Use oral substitutes in place of cigarettes. Try using lemon drops, carrots, cinnamon sticks, or chewing gum. Keep them handy so they are available when you have the urge to smoke.  When you have the urge to smoke, try deep breathing.  Designate your home as a nonsmoking area.  If you are a heavy smoker, ask your health care provider about a prescription for  nicotine chewing gum. It can ease your withdrawal from nicotine.  Reward yourself. Set aside the cigarette money you save and buy yourself something nice.  Look for support from others. Join a support group or smoking cessation program. Ask someone at home or at work   to help you with your plan to quit smoking.  Always ask yourself, "Do I need this cigarette or is this just a reflex?" Tell yourself, "Today, I choose not to smoke," or "I do not want to smoke." You are reminding yourself of your decision to quit.  Do not replace cigarette smoking with electronic cigarettes (commonly called e-cigarettes). The safety of e-cigarettes is unknown, and some may contain harmful chemicals.  If you relapse, do not give up! Plan ahead and think about what you will do the next time you get the urge to smoke. HOW WILL I FEEL WHEN I QUIT SMOKING? You may have symptoms of withdrawal because your body is used to nicotine (the addictive substance in cigarettes). You may crave cigarettes, be irritable, feel very hungry, cough often, get headaches, or have difficulty concentrating. The withdrawal symptoms are only temporary. They are strongest when you first quit but will go away within 10-14 days. When withdrawal symptoms occur, stay in control. Think about your reasons for quitting. Remind yourself that these are signs that your body is healing and getting used to being without cigarettes. Remember that withdrawal symptoms are easier to treat than the major diseases that smoking can cause.  Even after the withdrawal is over, expect periodic urges to smoke. However, these cravings are generally short lived and will go away whether you smoke or not. Do not smoke! WHAT RESOURCES ARE AVAILABLE TO HELP ME QUIT SMOKING? Your health care provider can direct you to community resources or hospitals for support, which may include:  Group support.  Education.  Hypnosis.  Therapy. Document Released: 02/18/2004 Document  Revised: 10/06/2013 Document Reviewed: 11/07/2012 [employer] Patient Information 2015 [employer]. This information is not intended to replace advice given to you by your health care provider. Make sure you discuss any questions you have with your health care provider.  

## 2014-07-03 NOTE — Addendum Note (Signed)
Addended by: Mena Goes on: 07/03/2014 03:36 PM   Modules accepted: Orders

## 2014-07-21 ENCOUNTER — Ambulatory Visit (INDEPENDENT_AMBULATORY_CARE_PROVIDER_SITE_OTHER): Payer: Medicare Other | Admitting: Nurse Practitioner

## 2014-07-21 ENCOUNTER — Encounter: Payer: Self-pay | Admitting: Nurse Practitioner

## 2014-07-21 VITALS — BP 140/80 | HR 97 | Temp 97.0°F | Ht 67.0 in | Wt 267.6 lb

## 2014-07-21 DIAGNOSIS — Z8673 Personal history of transient ischemic attack (TIA), and cerebral infarction without residual deficits: Secondary | ICD-10-CM

## 2014-07-21 DIAGNOSIS — I10 Essential (primary) hypertension: Secondary | ICD-10-CM

## 2014-07-21 HISTORY — DX: Personal history of transient ischemic attack (TIA), and cerebral infarction without residual deficits: Z86.73

## 2014-07-21 MED ORDER — CLOPIDOGREL BISULFATE 75 MG PO TABS: ORAL_TABLET | ORAL | Status: DC

## 2014-07-21 NOTE — Patient Instructions (Signed)
Continue Plavix , will give patient 3 month refill per request Keep B/P  222 systolic or lower todays reading 140/80 Lipids followed by PCP Use CPAP as directed F/U yearly and prn

## 2014-07-21 NOTE — Progress Notes (Signed)
[facility]  PATIENT: Kyle Lewis DOB: 05-Oct-1945   REASON FOR VISIT:follow up stroke HISTORY FROM:patient    HISTORY OF PRESENT ILLNESS:Kyle Lewis, 69 year old male returns for followup. He was last seen in this office 10/13/13. 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 He has been on Plavix since that time for secondary stroke prevention without further stroke or TIA symptoms. His blood pressure has been in good control and his lipids are followed by his primary care Dr. Alona Bene in [facility]. His most recent carotid Doppler was done 11/21/2012 by vascular and vein specialist at [facility]. 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 Right internal carotid artery not evaluated due to occlusion.  He does little exercise. He has minimal bruising from his Plavix. He also has obstructive sleep apnea and is compliant with his CPAP. He continues to smoke about 10 cigarettes a day. He has no new neurologic complaints  HISTORY developed sudden onset of left upper limb and hand numbness one week prior to presentation. He was initially seen at [facility] and diagnosed with a TIA. He had outpatient workup arrange an MRI scan subsequently showed a right frontal MCA branch infarct. CT angiogram showed complete occlusion of the right internal carotid artery at its origin. There was some reconstitution in the ophthalmic segment. Left vertebral artery also had proximal high-grade stenosis. The patient was started on Plavix for secondary stroke prevention 2-D echocardiogram showed normal ejection fraction. Patient states is done well since discharge and he has only minimum tingling in his left upper lip and fingertips with some diminished fine motor skills. He has otherwise made good recovery and has no  significant physical or cognitive limitations. He wants to go back to driving. 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 Is tolerating Plavix without bleeding, bruising or other side effects. He states his blood pressure is under good control. He does have sleep apnea and uses his CPAP every night. He claimed to have quit smoking when asked him but while filling out the paperwork he stated that he still smokes half a pack per day. He was not aware of any known diagnosis of right carotid stenosis. He denies any significant injury to his neck or neck pain to suggest possible carotid dissection as etiology of his carotid occlusion.   REVIEW OF SYSTEMS: Full 14 system review of systems performed and notable only for those listed, all others are neg:  Constitutional: neg  Cardiovascular: neg Ear/Nose/Throat: neg  Skin: neg Eyes: neg Respiratory: neg Gastroitestinal: Urinary frequency  Hematology/Lymphatic: Mild bruising Endocrine: neg Musculoskeletal:neg Allergy/Immunology: neg Neurological: neg Psychiatric: neg Sleep : Obstructive sleep apnea with CPAP ALLERGIES: No Known Allergies  HOME MEDICATIONS: Outpatient Prescriptions Prior to Visit  Medication Sig Dispense Refill  . B Complex-C (B-COMPLEX WITH VITAMIN C) tablet Take 1 tablet by mouth daily.    . finasteride (PROSCAR) 5 MG tablet Take 5 mg by mouth daily.    Marland Kitchen losartan-hydrochlorothiazide (HYZAAR) 100-25 MG per tablet Take 1 tablet by mouth every morning.    . Multiple Vitamin (MULTIVITAMIN WITH MINERALS) TABS Take 1 tablet by mouth daily.    Marland Kitchen omega-3 acid ethyl esters (LOVAZA) 1 G capsule Take 2 g by mouth 2 (two) times daily.    Marland Kitchen oxyCODONE (OXY IR/ROXICODONE) 5 MG immediate release tablet Take 10  mg by mouth as needed. Once per night    . Tamsulosin HCl (FLOMAX) 0.4 MG CAPS Take 0.4 mg by mouth daily after supper.     . vitamin C (ASCORBIC ACID) 500 MG tablet Take 500 mg by mouth daily.    .  clopidogrel (PLAVIX) 75 MG tablet TAKE 1 TABLET (75 MG TOTAL) BY MOUTH DAILY. 90 tablet 0  . Cholecalciferol (VITAMIN D-3) 5000 UNITS TABS Take 1 tablet by mouth daily.    . mirabegron ER (MYRBETRIQ) 50 MG TB24 tablet Take 50 mg by mouth daily.    . clopidogrel (PLAVIX) 75 MG tablet TAKE 1 TABLET (75 MG TOTAL) BY MOUTH DAILY. 90 tablet 0   No facility-administered medications prior to visit.    PAST MEDICAL HISTORY: Past Medical History  Diagnosis Date  . Diverticulosis   . Adenomatous colon polyp   . Sleep apnea     wears CPAP  . Allergy     seasonal  . Arthritis   . Cataract   . Hypertension   . Nocturia   . BPH (benign prostatic hyperplasia)   . Adrenal mass   . Chronic back pain   . CVA (cerebral infarction)   . COPD (chronic obstructive pulmonary disease)   . Peripheral vascular disease   . Stroke   . Carotid artery occlusion     PAST SURGICAL HISTORY: Past Surgical History  Procedure Laterality Date  . Umbilical hernia repair  2011  . Tonsillectomy    . Appendectomy    . Brain meningioma excision    . Carpal tunnel release      left  . Cataracts      both eyes  . Inguinal hernia repair    . Transurethral resection of prostate    . Total knee arthroplasty  04/01/2012    Procedure: TOTAL KNEE ARTHROPLASTY;  Surgeon: Gearlean Alf, MD;  Location: [facility];  Service: Orthopedics;  Laterality: Left;  . Joint replacement Left 04-01-12    Knee  . Eye surgery Bilateral     Cataract  . Prostate surgery      FAMILY HISTORY: Family History  Problem Relation Age of Onset  . Prostate cancer Maternal Grandfather   . Colon cancer Neg Hx   . Esophageal cancer Neg Hx   . Rectal cancer Neg Hx   . Stomach cancer Neg Hx   . Heart disease Mother     After 3 yrs of age  . Hypertension Mother   . Heart attack Mother   . Hyperlipidemia Mother   . Diabetes Father   . Heart attack Father   . Heart disease Father     NOT  before age 26- [facility]    SOCIAL  HISTORY: History   Social History  . Marital Status: Married    Spouse Name: [name]   . Number of Children: 2  . Years of Education: College    Occupational History  .      Self employed Administrator    Social History Main Topics  . Smoking status: Light Tobacco Smoker -- 0.50 packs/day for 20 years    Types: Cigarettes  . Smokeless tobacco: Never Used     Comment: pt states he is trying to quit  . Alcohol Use: Yes     Comment: rare  . Drug Use: No  . Sexual Activity: Not on file   Other Topics Concern  . Not on file   Social History Narrative   Patient lives at  home with his wife. Rhonda    Patient has a Financial risk analyst.    Patient has 2 sons.    Patient smokes a half pack a day.      PHYSICAL EXAM  Filed Vitals:   07/21/14 0837  BP: 140/80  Pulse: 97  Temp: 97 F (36.1 C)  TempSrc: Oral  Height: 5\' 7"  (1.702 m)  Weight: 267 lb 9.6 oz (121.383 kg)   Body mass index is 41.9 kg/(m^2). Generalized: Well developed, morbidly obese male in no acute distress  Head: normocephalic and atraumatic,. Oropharynx benign  Neck: Supple, no carotid bruits  Cardiac: Regular rate rhythm Musculoskeletal: No deformity   Neurological examination   Mentation: Alert oriented to time, place, history taking. Follows all commands speech and language fluent  Cranial nerve II-XII: Pupils were equal round reactive to light extraocular movements were full, visual field were full on confrontational test. Facial sensation and strength were normal. hearing was intact to finger rubbing bilaterally. Uvula tongue midline. head turning and shoulder shrug were normal and symmetric.Tongue protrusion into cheek strength was normal. Motor: normal bulk and tone, full strength in the BUE, BLE, fine finger movements normal, no pronator drift. No focal weakness Sensory: normal and symmetric to light touch, pinprick, and vibration  Coordination: finger-nose-finger, heel-to-shin bilaterally, no  dysmetria Reflexes: Brachioradialis 2/2, biceps 2/2, triceps 2/2, patellar 2/2, Achilles 2/2, plantar responses were flexor bilaterally. Gait and Station: Rising up from seated position without assistance, normal stance, moderate stride, good arm swing, smooth turning, able to perform tiptoe, and heel walking without difficulty. Tandem gait is mildly unsteady. No assistive device  DIAGNOSTIC DATA (LABS, IMAGING, TESTING)   ASSESSMENT AND PLAN  69 y.o. year old male  has a past medical history of right frontal embolic infarct secondary to right internal carotid artery occlusion in November 2013. Multiple risk factors of smoking, obesity, hypertension,  and obstructive sleep apnea. He has not had further stroke or TIA symptoms. He is currently on Plavix with minimal bruising.The patient is a current patient of Dr. Leonie Man  who is out of the office today . This note is sent to the work in doctor.     Continue Plavix , will give patient 3 month refill per request Keep B/P  062 systolic or lower todays reading 140/80 keep a log and take to PCP Dr. Alona Bene Lipids followed by PCP Use CPAP as directed F/U yearly and prn  Dennie Bible, [facility], [facility], [facility] [address], [place], [place] [address] [phone]

## 2014-07-21 NOTE — Progress Notes (Signed)
I have read the note, and I agree with the clinical assessment and plan.  Nyree Yonker KEITH   

## 2014-09-11 ENCOUNTER — Other Ambulatory Visit: Payer: Self-pay | Admitting: Nurse Practitioner

## 2015-01-07 ENCOUNTER — Other Ambulatory Visit (HOSPITAL_COMMUNITY): Payer: Medicare Other

## 2015-01-07 ENCOUNTER — Ambulatory Visit: Payer: Medicare Other | Admitting: Family

## 2015-01-07 DIAGNOSIS — R319 Hematuria, unspecified: Secondary | ICD-10-CM | POA: Insufficient documentation

## 2015-01-26 ENCOUNTER — Encounter: Payer: Self-pay | Admitting: Internal Medicine

## 2015-05-05 ENCOUNTER — Telehealth: Payer: Self-pay | Admitting: Neurology

## 2015-05-05 NOTE — Telephone Encounter (Signed)
Deanna with [facility] inquiring if medical release to come off plavix was rec'd. Please call at [phone]

## 2015-05-05 NOTE — Telephone Encounter (Signed)
Rn call Deanna and stated that form has not been receive to have clearance.Deanna stated it was fax last week before holidays. Rn gave Deanna pod fax number. Rn stated Dr.Sethi will be able to sign it before Friday. Pt has to be off Plavix for 5 days before his back injection next Wednesday.

## 2015-05-07 NOTE — Telephone Encounter (Signed)
Form fax to [place] at  [phone] for clearance form for patient. Pt needed neuro clearance. Form fax twice and receive.

## 2015-05-18 ENCOUNTER — Other Ambulatory Visit: Payer: Self-pay | Admitting: Physical Medicine and Rehabilitation

## 2015-05-19 ENCOUNTER — Other Ambulatory Visit: Payer: Self-pay | Admitting: *Deleted

## 2015-05-19 DIAGNOSIS — I714 Abdominal aortic aneurysm, without rupture, unspecified: Secondary | ICD-10-CM

## 2015-05-21 ENCOUNTER — Ambulatory Visit (HOSPITAL_COMMUNITY)
Admission: RE | Admit: 2015-05-21 | Discharge: 2015-05-21 | Disposition: A | Discharge status: Home / Self Care | Payer: Medicare Other | Source: Ambulatory Visit | Attending: Vascular Surgery | Admitting: Vascular Surgery

## 2015-05-21 ENCOUNTER — Other Ambulatory Visit (HOSPITAL_COMMUNITY): Payer: Medicare Other

## 2015-05-21 DIAGNOSIS — I714 Abdominal aortic aneurysm, without rupture, unspecified: Secondary | ICD-10-CM

## 2015-05-21 DIAGNOSIS — I10 Essential (primary) hypertension: Secondary | ICD-10-CM | POA: Insufficient documentation

## 2015-05-21 DIAGNOSIS — I723 Aneurysm of iliac artery: Secondary | ICD-10-CM | POA: Diagnosis not present

## 2015-05-28 ENCOUNTER — Other Ambulatory Visit: Payer: Self-pay | Admitting: Neurology

## 2015-06-01 ENCOUNTER — Other Ambulatory Visit (HOSPITAL_COMMUNITY): Payer: Medicare Other

## 2015-06-01 ENCOUNTER — Ambulatory Visit (HOSPITAL_COMMUNITY)
Admission: RE | Admit: 2015-06-01 | Discharge: 2015-06-01 | Disposition: A | Discharge status: Home / Self Care | Payer: Medicare Other | Source: Ambulatory Visit | Attending: Vascular Surgery | Admitting: Vascular Surgery

## 2015-06-01 DIAGNOSIS — I6523 Occlusion and stenosis of bilateral carotid arteries: Secondary | ICD-10-CM | POA: Insufficient documentation

## 2015-06-01 DIAGNOSIS — I6521 Occlusion and stenosis of right carotid artery: Secondary | ICD-10-CM | POA: Diagnosis present

## 2015-06-01 DIAGNOSIS — I10 Essential (primary) hypertension: Secondary | ICD-10-CM | POA: Insufficient documentation

## 2015-06-03 ENCOUNTER — Encounter: Payer: Self-pay | Admitting: Vascular Surgery

## 2015-06-10 ENCOUNTER — Encounter: Payer: Self-pay | Admitting: Vascular Surgery

## 2015-06-10 ENCOUNTER — Ambulatory Visit (INDEPENDENT_AMBULATORY_CARE_PROVIDER_SITE_OTHER): Payer: Medicare Other | Admitting: Vascular Surgery

## 2015-06-10 VITALS — BP 119/75 | HR 87 | Temp 98.2°F | Resp 16 | Ht 65.75 in | Wt 250.0 lb

## 2015-06-10 DIAGNOSIS — I714 Abdominal aortic aneurysm, without rupture, unspecified: Secondary | ICD-10-CM

## 2015-06-10 DIAGNOSIS — F17210 Nicotine dependence, cigarettes, uncomplicated: Secondary | ICD-10-CM | POA: Diagnosis not present

## 2015-06-10 DIAGNOSIS — I6521 Occlusion and stenosis of right carotid artery: Secondary | ICD-10-CM

## 2015-06-10 NOTE — Progress Notes (Signed)
History of Present Illness:  Patient is a 70 y.o. year old male who presents for evaluation of carotid stenosis as well as newly discovered abdominal aortic aneurysm.   The patient had a stroke approximate 4 years ago. He was noted to have occlusion of his right internal carotid artery at that time.. At that time the left internal carotid artery also had some stenosis. He had some tingling in his left fingertips and left lip at that time. This has improved somewhat. The patient denies any  recent symptoms of TIA, amaurosis, or stroke.  The patient is currently on Plavix antiplatelet therapy.  Other medical problems include sleep apnea requiring CPAP, hypertension, COPD.  These are currently stable. He also has a history of tobacco abuse. He currently smokes about one pack of cigarettes per day. Greater than 3 minutes they were spent regarding smoking cessation counseling.   He was recently found to have a 4.8 cm abdominal aortic aneurysm on MRI scan for back pain. He presents today for further evaluation of his carotid stenosis as well as aneurysm.    Past Medical History   Diagnosis  Date   .  Diverticulosis     .  Adenomatous colon polyp     .  Sleep apnea         wears CPAP   .  Allergy         seasonal   .  Arthritis     .  Cataract     .  Hypertension     .  Nocturia     .  BPH (benign prostatic hyperplasia)     .  Adrenal mass     .  Chronic back pain     .  CVA (cerebral infarction)     .  COPD (chronic obstructive pulmonary disease)     .  Peripheral vascular disease     .  Stroke         Past Surgical History   Procedure  Date   .  Umbilical hernia repair     .  Tonsillectomy     .  Appendectomy     .  Brain meningioma excision     .  Carpal tunnel release         left   .  Cataracts         both eyes   .  Inguinal hernia repair     .  Transurethral resection of prostate     .  Total knee arthroplasty  04/01/2012       Procedure: TOTAL KNEE ARTHROPLASTY;  Surgeon: Gearlean Alf, MD;  Location: [facility];  Service: Orthopedics;  Laterality: Left;      Social History History   Substance Use Topics   .  Smoking status:  Current Every Day Smoker -- 0.5 packs/day for 20 years       Types:  Cigarettes   .  Smokeless tobacco:  Never Used         Comment: pt states he is trying to quit   .  Alcohol Use:  Yes         Comment: rare     Family History Family History   Problem  Relation  Age of Onset   .  Prostate cancer  Maternal Grandfather     .  Colon cancer  Neg Hx     .  Esophageal cancer  Neg Hx     .  Rectal cancer  Neg Hx     .  Stomach cancer  Neg Hx     .  Heart disease  Mother     .  Hypertension  Mother     .  Diabetes  Father     .  Heart attack  Father       Allergies  No Known Allergies     Current Outpatient Prescriptions on File Prior to Visit  Medication Sig Dispense Refill  . B Complex-C (B-COMPLEX WITH VITAMIN C) tablet Take 1 tablet by mouth daily.    . Cholecalciferol (VITAMIN D-3) 5000 UNITS TABS Take 1 tablet by mouth daily.    . clopidogrel (PLAVIX) 75 MG tablet TAKE 1 TABLET BY MOUTH DAILY. 90 tablet 0  . finasteride (PROSCAR) 5 MG tablet Take 5 mg by mouth daily.    Marland Kitchen losartan-hydrochlorothiazide (HYZAAR) 100-25 MG per tablet Take 1 tablet by mouth every morning.    . mirabegron ER (MYRBETRIQ) 50 MG TB24 tablet Take 50 mg by mouth daily.    . Multiple Vitamin (MULTIVITAMIN WITH MINERALS) TABS Take 1 tablet by mouth daily.    Marland Kitchen omega-3 acid ethyl esters (LOVAZA) 1 G capsule Take 2 g by mouth 2 (two) times daily.    Marland Kitchen oxyCODONE (OXY IR/ROXICODONE) 5 MG immediate release tablet Take 10 mg by mouth as needed. Once per night    . Tamsulosin HCl (FLOMAX) 0.4 MG CAPS Take 0.4 mg by mouth daily after supper.     . vitamin C (ASCORBIC ACID) 500 MG tablet Take 500 mg by mouth daily.     No current facility-administered medications on file prior to visit.    ROS:    General:  No weight loss, Fever, chills  HEENT: No recent  headaches, no nasal bleeding, no visual changes, no sore throat  Neurologic: No dizziness, blackouts, seizures. No recent symptoms of stroke or mini- stroke. No recent episodes of slurred speech, or temporary blindness.  Cardiac: No recent episodes of chest pain/pressure, + shortness of breath at rest.  No shortness of breath with exertion.  Denies history of atrial fibrillation or irregular heartbeat  Vascular: No history of rest pain in feet.  No history of claudication.  No history of non-healing ulcer, No history of DVT    Pulmonary: No home oxygen, no productive cough, no hemoptysis,  No asthma or wheezing  Musculoskeletal:  [ ]  Arthritis, [x ] Low back pain,  [ ]  Joint pain  Hematologic:No history of hypercoagulable state.  No history of easy bleeding.  No history of anemia  Gastrointestinal: No hematochezia or melena,  No gastroesophageal reflux, no trouble swallowing  Urinary: [ ]  chronic Kidney disease, [ ]  on HD - [ ]  MWF or [ ]  TTHS, [ ]  Burning with urination, [ ]  Frequent urination, [ ]  Difficulty urinating;    Skin: No rashes  Psychological: No history of anxiety,  No history of depression   Physical Examination    Filed Vitals:   06/10/15 1023  BP: 119/75  Pulse: 87  Temp: 98.2 F (36.8 C)  TempSrc: Oral  Resp: 16  Height: 5' 5.75" (1.67 m)  Weight: 250 lb (113.399 kg)  SpO2: 96%    General:  Alert and oriented, no acute distress HEENT: Normal Neck: No bruit or JVD Pulmonary: Clear to auscultation bilaterally Cardiac: Regular Rate and Rhythm without murmur Gastrointestinal: Soft, non-tender, non-distended, no mass, periumbilical scar,  obese Skin: No rash Extremity Pulses:  2+ radial, brachial, femoral,  absent dorsalis pedis, posterior tibial pulses bilaterally Musculoskeletal: No deformity or edema     Neurologic: Upper and lower extremity motor 5/5 and symmetric, no facial droop  DATA: A carotid duplex exam was obtained today. I reviewed and  interpreted this study. This shows a 40-60% left internal carotid artery stenosis. 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 There is antegrade vertebral flow bilaterally.   This is unchanged over the last 4 years.   Patient also had a recent aortic ultrasound which showed aortic diameter of 5 cm. However this was a difficult exam and may have been measured on an oblique so potentially is smaller diameter than this. 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   Continue risk factor modification and again emphasized smoking cessation  PLAN:  The patient requires followup of his left internal carotid artery stenosis. Also if he exhibits any symptoms of left brain TIA or stroke symptoms we would need to consider left-sided carotid endarterectomy. He will continue to take his Plavix.  He will try to quit smoking. He will followup with a repeat duplex exam in 12 months. Continue medical therapy for right internal carotid artery occlusion no surgical intervention for this.   Abdominal aortic aneurysm 4.8 cm by MRI scan. 5 cm 2. 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 I discussed the patient today the risk of rupture of abdominal aortic aneurysm less than 5 and half centimeters is less than 1% per year. We will obtain a CT angiogram in 6 months time to review his entire aortoiliac system.  If this is stable we will do continued surveillance with ultrasound.  Ruta Hinds, MD [facility] Office: [phone] Pager: [phone]

## 2015-06-11 ENCOUNTER — Encounter: Payer: Self-pay | Admitting: Physical Medicine and Rehabilitation

## 2015-06-14 DIAGNOSIS — M5136 Other intervertebral disc degeneration, lumbar region: Secondary | ICD-10-CM | POA: Insufficient documentation

## 2015-06-14 DIAGNOSIS — M51369 Other intervertebral disc degeneration, lumbar region without mention of lumbar back pain or lower extremity pain: Secondary | ICD-10-CM

## 2015-06-14 HISTORY — DX: Other intervertebral disc degeneration, lumbar region: M51.36

## 2015-06-14 HISTORY — DX: Other intervertebral disc degeneration, lumbar region without mention of lumbar back pain or lower extremity pain: M51.369

## 2015-06-15 ENCOUNTER — Other Ambulatory Visit (HOSPITAL_COMMUNITY): Payer: Self-pay | Admitting: Neurological Surgery

## 2015-06-29 ENCOUNTER — Encounter (HOSPITAL_COMMUNITY)
Admission: RE | Admit: 2015-06-29 | Discharge: 2015-06-29 | Disposition: A | Discharge status: Home / Self Care | Payer: Medicare Other | Source: Ambulatory Visit | Attending: Neurological Surgery | Admitting: Neurological Surgery

## 2015-06-29 ENCOUNTER — Encounter (HOSPITAL_COMMUNITY): Payer: Self-pay

## 2015-06-29 DIAGNOSIS — G4733 Obstructive sleep apnea (adult) (pediatric): Secondary | ICD-10-CM | POA: Diagnosis not present

## 2015-06-29 DIAGNOSIS — J449 Chronic obstructive pulmonary disease, unspecified: Secondary | ICD-10-CM | POA: Insufficient documentation

## 2015-06-29 DIAGNOSIS — Z96652 Presence of left artificial knee joint: Secondary | ICD-10-CM | POA: Insufficient documentation

## 2015-06-29 DIAGNOSIS — M48061 Spinal stenosis, lumbar region without neurogenic claudication: Secondary | ICD-10-CM

## 2015-06-29 DIAGNOSIS — Z01812 Encounter for preprocedural laboratory examination: Secondary | ICD-10-CM | POA: Insufficient documentation

## 2015-06-29 DIAGNOSIS — Z8673 Personal history of transient ischemic attack (TIA), and cerebral infarction without residual deficits: Secondary | ICD-10-CM | POA: Insufficient documentation

## 2015-06-29 DIAGNOSIS — Z79899 Other long term (current) drug therapy: Secondary | ICD-10-CM | POA: Insufficient documentation

## 2015-06-29 DIAGNOSIS — M4806 Spinal stenosis, lumbar region: Secondary | ICD-10-CM | POA: Insufficient documentation

## 2015-06-29 DIAGNOSIS — G8929 Other chronic pain: Secondary | ICD-10-CM | POA: Insufficient documentation

## 2015-06-29 DIAGNOSIS — M549 Dorsalgia, unspecified: Secondary | ICD-10-CM | POA: Diagnosis not present

## 2015-06-29 DIAGNOSIS — Z0183 Encounter for blood typing: Secondary | ICD-10-CM | POA: Insufficient documentation

## 2015-06-29 DIAGNOSIS — I10 Essential (primary) hypertension: Secondary | ICD-10-CM | POA: Diagnosis not present

## 2015-06-29 DIAGNOSIS — D3502 Benign neoplasm of left adrenal gland: Secondary | ICD-10-CM | POA: Diagnosis not present

## 2015-06-29 DIAGNOSIS — Z7902 Long term (current) use of antithrombotics/antiplatelets: Secondary | ICD-10-CM | POA: Insufficient documentation

## 2015-06-29 DIAGNOSIS — Z01818 Encounter for other preprocedural examination: Secondary | ICD-10-CM | POA: Diagnosis not present

## 2015-06-29 DIAGNOSIS — M4310 Spondylolisthesis, site unspecified: Secondary | ICD-10-CM | POA: Insufficient documentation

## 2015-06-29 DIAGNOSIS — Z7982 Long term (current) use of aspirin: Secondary | ICD-10-CM | POA: Insufficient documentation

## 2015-06-29 DIAGNOSIS — IMO0002 Reserved for concepts with insufficient information to code with codable children: Secondary | ICD-10-CM

## 2015-06-29 DIAGNOSIS — I714 Abdominal aortic aneurysm, without rupture: Secondary | ICD-10-CM | POA: Diagnosis not present

## 2015-06-29 HISTORY — DX: Abdominal aortic aneurysm, without rupture: I71.4

## 2015-06-29 HISTORY — DX: Abdominal aortic aneurysm, without rupture, unspecified: I71.40

## 2015-06-29 HISTORY — DX: Nonrheumatic aortic (valve) stenosis: I35.0

## 2015-06-29 LAB — BASIC METABOLIC PANEL
Anion gap: 8 (ref 5–15)
BUN: 12 mg/dL (ref 6–20)
CO2: 27 mmol/L (ref 22–32)
Calcium: 9.5 mg/dL (ref 8.9–10.3)
Chloride: 102 mmol/L (ref 101–111)
Creatinine, Ser: 0.62 mg/dL (ref 0.61–1.24)
GFR calc Af Amer: >60 mL/min (ref >60)
GFR calc non Af Amer: >60 mL/min (ref >60)
Glucose, Bld: 121 mg/dL — ABNORMAL HIGH (ref 65–99)
Potassium: 4.4 mmol/L (ref 3.5–5.1)
Sodium: 137 mmol/L (ref 135–145)

## 2015-06-29 LAB — CBC WITH DIFFERENTIAL/PLATELET
Basophils Absolute: 0 10*3/uL (ref 0.0–0.1)
Basophils Relative: 0 %
Eosinophils Absolute: 0.1 10*3/uL (ref 0.0–0.7)
Eosinophils Relative: 1 %
HCT: 47 % (ref 39.0–52.0)
Hemoglobin: 16.5 g/dL (ref 13.0–17.0)
Lymphocytes Relative: 20 %
Lymphs Abs: 1.8 10*3/uL (ref 0.7–4.0)
MCH: 31.9 pg (ref 26.0–34.0)
MCHC: 35.1 g/dL (ref 30.0–36.0)
MCV: 90.7 fL (ref 78.0–100.0)
Monocytes Absolute: 0.6 10*3/uL (ref 0.1–1.0)
Monocytes Relative: 6 %
Neutro Abs: 6.6 10*3/uL (ref 1.7–7.7)
Neutrophils Relative %: 73 %
Platelets: 259 10*3/uL (ref 150–400)
RBC: 5.18 MIL/uL (ref 4.22–5.81)
RDW: 13.7 % (ref 11.5–15.5)
WBC: 9.1 10*3/uL (ref 4.0–10.5)

## 2015-06-29 LAB — ABO/RH: ABO/RH(D): O POS

## 2015-06-29 LAB — TYPE AND SCREEN
ABO/RH(D): O POS
Antibody Screen: NEGATIVE

## 2015-06-29 LAB — PROTIME-INR
INR: 1.04 (ref 0.00–1.49)
Prothrombin Time: 13.8 seconds (ref 11.6–15.2)

## 2015-06-29 LAB — SURGICAL PCR SCREEN
MRSA, PCR: NEGATIVE
Staphylococcus aureus: NEGATIVE

## 2015-06-29 NOTE — Pre-Procedure Instructions (Addendum)
Kyle Lewis  06/29/2015      [id number] - [place], [place] [address] [place] [address] Phone: [phone] Fax: [phone]    Your procedure is scheduled on 07/07/15.  Report to [facility] Admitting at 945 A.M.  Call this number if you have problems the morning of surgery:  [phone]   Remember:  Do not eat food or drink liquids after midnight.  Take these medicines the morning of surgery with A SIP OF WATER pain med if needed,   STOP all herbel meds, nsaids (aleve,naproxen,advil,ibuprofen) 5 days prior to surgery including all vitamins(B,D,C, multi,),omega 3 fish oil 07/02/15   STOP plavix per dr   Lazaro Arms not wear jewelry, make-up or nail polish.  Do not wear lotions, powders, or perfumes.  You may wear deodorant.  Do not shave 48 hours prior to surgery.  Men may shave face and neck.  Do not bring valuables to the hospital.  [facility] is not responsible for any belongings or valuables.  Contacts, dentures or bridgework may not be worn into surgery.  Leave your suitcase in the car.  After surgery it may be brought to your room.  For patients admitted to the hospital, discharge time will be determined by your treatment team.  Patients discharged the day of surgery will not be allowed to drive home.   Name and phone number of your driver:    Special instructions:   Special Instructions: [place] - Preparing for Surgery  Before surgery, you can play an important role.  Because skin is not sterile, your skin needs to be as free of germs as possible.  You can reduce the number of germs on you skin by washing with CHG (chlorahexidine gluconate) soap before surgery.  CHG is an antiseptic cleaner which kills germs and bonds with the skin to continue killing germs even after washing.  Please DO NOT use if you have an allergy to CHG or antibacterial soaps.  If your skin becomes reddened/irritated stop using  the CHG and inform your nurse when you arrive at Short Stay.  Do not shave (including legs and underarms) for at least 48 hours prior to the first CHG shower.  You may shave your face.  Please follow these instructions carefully:   1.  Shower with CHG Soap the night before surgery and the morning of Surgery.  2.  If you choose to wash your hair, wash your hair first as usual with your normal shampoo.  3.  After you shampoo, rinse your hair and body thoroughly to remove the Shampoo.  4.  Use CHG as you would any other liquid soap.  You can apply chg directly  to the skin and wash gently with scrungie or a clean washcloth.  5.  Apply the CHG Soap to your body ONLY FROM THE NECK DOWN.  Do not use on open wounds or open sores.  Avoid contact with your eyes ears, mouth and genitals (private parts).  Wash genitals (private parts)       with your normal soap.  6.  Wash thoroughly, paying special attention to the area where your surgery will be performed.  7.  Thoroughly rinse your body with warm water from the neck down.  8.  DO NOT shower/wash with your normal soap after using and rinsing off the CHG Soap.  9.  Pat yourself dry with a clean towel.  10.  Wear clean pajamas.            11.  Place clean sheets on your bed the night of your first shower and do not sleep with pets.  Day of Surgery  Do not apply any lotions/deodorants the morning of surgery.  Please wear clean clothes to the hospital/surgery center.  Please read over the following fact sheets that you were given. Pain Booklet, Coughing and Deep Breathing, Blood Transfusion Information, MRSA Information and Surgical Site Infection Prevention

## 2015-06-30 ENCOUNTER — Encounter (HOSPITAL_COMMUNITY): Payer: Self-pay

## 2015-06-30 NOTE — Progress Notes (Addendum)
Anesthesia Chart Review:  Patient is a 70 year old male scheduled for MAS PLIF L4-5, L2-3, L3-4 laminectomies on 07/07/15 by Dr. Ronnald Ramp.  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 BMI is consistent with obesity. PCP is listed as Dr. [name].  Vascular surgeon is Dr. Oneida Alar, last visit 06/10/15. He recommended continued medical therapy for carotid disease and plan to re-evaluate aneurysms in 6 months (next visit 12/2015).  Meds include ASA 81 mg, Plavix 75 mg (on hold starting 06/30/15), Flexeril, finasteride, losartan-HCTZ, Lovaza, oxycodone, Flomax.   06/29/15 EKG: NSR.  05/04/12 Echocardiogram: Study Conclusions - Left ventricle: The cavity size was normal. Systolic function was normal. Wall motion was normal; there were no regional wall motion abnormalities. - Aortic valve: Trileaflet; mildly thickened, mildly calcified leaflets. Mobility was not restricted. Doppler: There was mild stenosis. No regurgitation. VTI ratio of LVOT to aortic valve: 0.47. Valve area: 1.46cm^2(VTI). Indexed valve area: 0.62cm^2/m^2 (VTI). Peak velocity ratio of LVOT to aortic valve: 0.41. Valve area: 1.28cm^2 (Vmax). Indexed valve area: 0.54cm^2/m^2 (Vmax). Mean gradient: 1mm Hg (S). Peak gradient: 91mm Hg (S). - Mitral valve: Calcified annulus. Recommendations: Consider transesophageal echocardiography if clinically indicated.        06/01/15 Carotid U/S: Known occlusion of the right ICA noted. Evidence of 40-59% stenosis of the LICA. Bilateral vertebral artery is antegrade, right is elevated due to compensatory flow.  05/21/15 Abdominal aorta U/S: Impression: Maximum  AAA measurement 4.7 cm X 5.0 cm, however, this may be inaccurate due to oblique acoustic windows. 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 (05/12/15 MRI L-spine measurements showed AAA up to 4.8 cm and LCIA 2.7 cm.)  06/29/15 CXR: IMPRESSION: No active cardiopulmonary disease. Atherosclerotic disease of the aorta.  Preoperative labs noted.   Discussed above with anesthesiologist Dr. Lissa Hoard. Patient with multiple CAD risk factors and mild AS on echo three years ago, recommend pre-operative cardiology evaluation. I will notify staff at Dr. Ronnald Ramp' office.  George Hugh [facility]/Anesthesiology Phone [phone] 06/30/2015 12:59 PM  Addendum: Patient was seen by cardiologist Dr. Jenne Campus with [facility] on 07/01/15 for pre-operative cardiology evaluation. Stress and echo ordered (see below). He wrote, "If stress test and echocardiogram showed no significant finding on think she will be acceptable candidate for surgery."  07/05/15 Nuclear stress test [facility]): Conclusions:  1. Normal left ventricular size and function. 2. No clinical or scintigraphic ischemia at target heart rate on dobutamine.  3. Negative dobutamine perfusion stress test.  07/01/15 Echo [facility]): Conclusion: 1. Native trileaflet aortic valve with trace regurgitation. Mild aortic leaflet thickening with mild calcification. Normal aortic valve leaflet mobility. Mild aortic valve stenosis. AV pk vel 2.54 m/s. AV pk grad 25.9 mmHg. AV mean grad 16.0 mmHg. AVA VTI 1.20 cm2. 2. Left ventricle cavity is normal in size. Moderate concentric hypertrophy of the left ventricle. Normal global wall motion. Visual EF is 60-65%. Doppler evidence of grade 1 (impaired) diastolic dysfunction. Calculated EF 60%.  George Hugh [facility] Short Stay Center/Anesthesiology Phone [phone] 07/06/2015 9:47 AM

## 2015-07-01 ENCOUNTER — Encounter (HOSPITAL_COMMUNITY): Payer: Medicare Other

## 2015-07-01 ENCOUNTER — Ambulatory Visit: Payer: Medicare Other | Admitting: Vascular Surgery

## 2015-07-01 DIAGNOSIS — I739 Peripheral vascular disease, unspecified: Secondary | ICD-10-CM | POA: Insufficient documentation

## 2015-07-01 DIAGNOSIS — I713 Abdominal aortic aneurysm, ruptured, unspecified: Secondary | ICD-10-CM

## 2015-07-01 DIAGNOSIS — I723 Aneurysm of iliac artery: Secondary | ICD-10-CM

## 2015-07-01 DIAGNOSIS — I714 Abdominal aortic aneurysm, without rupture: Secondary | ICD-10-CM

## 2015-07-01 HISTORY — DX: Abdominal aortic aneurysm, ruptured, unspecified: I71.30

## 2015-07-01 HISTORY — DX: Aneurysm of iliac artery: I72.3

## 2015-07-06 MED ORDER — DEXAMETHASONE SODIUM PHOSPHATE 10 MG/ML IJ SOLN
10.0000 mg | INTRAMUSCULAR | Status: AC
  Administered 2015-07-07: 10 mg via INTRAVENOUS
  Filled 2015-07-06: qty 1

## 2015-07-06 MED ORDER — CEFAZOLIN SODIUM-DEXTROSE 2-3 GM-% IV SOLR
2.0000 g | INTRAVENOUS | Status: AC
  Administered 2015-07-07: 2 g via INTRAVENOUS
  Administered 2015-07-07: 1 g via INTRAVENOUS
  Administered 2015-07-07: 2 g via INTRAVENOUS
  Filled 2015-07-06: qty 50

## 2015-07-07 ENCOUNTER — Inpatient Hospital Stay (HOSPITAL_COMMUNITY): Payer: Medicare Other

## 2015-07-07 ENCOUNTER — Inpatient Hospital Stay (HOSPITAL_COMMUNITY): Payer: Medicare Other | Admitting: Certified Registered"

## 2015-07-07 ENCOUNTER — Inpatient Hospital Stay (HOSPITAL_COMMUNITY)
Admission: RE | Admit: 2015-07-07 | Discharge: 2015-07-09 | DRG: 460 | Disposition: A | Discharge status: Home Health | Payer: Medicare Other | Source: Ambulatory Visit | Attending: Neurological Surgery | Admitting: Neurological Surgery

## 2015-07-07 ENCOUNTER — Inpatient Hospital Stay (HOSPITAL_COMMUNITY): Payer: Medicare Other | Admitting: Vascular Surgery

## 2015-07-07 ENCOUNTER — Encounter (HOSPITAL_COMMUNITY): Payer: Self-pay | Admitting: *Deleted

## 2015-07-07 ENCOUNTER — Encounter (HOSPITAL_COMMUNITY)
Admission: RE | Disposition: A | Discharge status: Home Health | Payer: Self-pay | Source: Ambulatory Visit | Attending: Neurological Surgery

## 2015-07-07 DIAGNOSIS — M4806 Spinal stenosis, lumbar region: Secondary | ICD-10-CM | POA: Diagnosis present

## 2015-07-07 DIAGNOSIS — M4316 Spondylolisthesis, lumbar region: Principal | ICD-10-CM | POA: Diagnosis present

## 2015-07-07 DIAGNOSIS — Z6839 Body mass index (BMI) 39.0-39.9, adult: Secondary | ICD-10-CM

## 2015-07-07 DIAGNOSIS — Z7902 Long term (current) use of antithrombotics/antiplatelets: Secondary | ICD-10-CM | POA: Diagnosis not present

## 2015-07-07 DIAGNOSIS — E662 Morbid (severe) obesity with alveolar hypoventilation: Secondary | ICD-10-CM | POA: Diagnosis present

## 2015-07-07 DIAGNOSIS — Z7982 Long term (current) use of aspirin: Secondary | ICD-10-CM | POA: Diagnosis not present

## 2015-07-07 DIAGNOSIS — I739 Peripheral vascular disease, unspecified: Secondary | ICD-10-CM | POA: Diagnosis present

## 2015-07-07 DIAGNOSIS — K5790 Diverticulosis of intestine, part unspecified, without perforation or abscess without bleeding: Secondary | ICD-10-CM | POA: Diagnosis present

## 2015-07-07 DIAGNOSIS — Z8673 Personal history of transient ischemic attack (TIA), and cerebral infarction without residual deficits: Secondary | ICD-10-CM

## 2015-07-07 DIAGNOSIS — Z981 Arthrodesis status: Secondary | ICD-10-CM

## 2015-07-07 DIAGNOSIS — Z96652 Presence of left artificial knee joint: Secondary | ICD-10-CM | POA: Diagnosis present

## 2015-07-07 DIAGNOSIS — I714 Abdominal aortic aneurysm, without rupture: Secondary | ICD-10-CM | POA: Diagnosis present

## 2015-07-07 DIAGNOSIS — F17210 Nicotine dependence, cigarettes, uncomplicated: Secondary | ICD-10-CM | POA: Diagnosis present

## 2015-07-07 DIAGNOSIS — J449 Chronic obstructive pulmonary disease, unspecified: Secondary | ICD-10-CM | POA: Diagnosis present

## 2015-07-07 DIAGNOSIS — I10 Essential (primary) hypertension: Secondary | ICD-10-CM | POA: Diagnosis present

## 2015-07-07 DIAGNOSIS — Z23 Encounter for immunization: Secondary | ICD-10-CM | POA: Diagnosis not present

## 2015-07-07 DIAGNOSIS — M549 Dorsalgia, unspecified: Secondary | ICD-10-CM | POA: Diagnosis present

## 2015-07-07 DIAGNOSIS — M5126 Other intervertebral disc displacement, lumbar region: Secondary | ICD-10-CM | POA: Diagnosis present

## 2015-07-07 DIAGNOSIS — Z419 Encounter for procedure for purposes other than remedying health state, unspecified: Secondary | ICD-10-CM

## 2015-07-07 HISTORY — DX: Arthrodesis status: Z98.1

## 2015-07-07 HISTORY — PX: MAXIMUM ACCESS (MAS)POSTERIOR LUMBAR INTERBODY FUSION (PLIF) 1 LEVEL: SHX6368

## 2015-07-07 LAB — GLUCOSE, CAPILLARY: Glucose-Capillary: 181 mg/dL — ABNORMAL HIGH (ref 65–99)

## 2015-07-07 SURGERY — FOR MAXIMUM ACCESS (MAS) POSTERIOR LUMBAR INTERBODY FUSION (PLIF) 1 LEVEL
Anesthesia: General | Site: Spine Lumbar

## 2015-07-07 MED ORDER — ALBUMIN HUMAN 5 % IV SOLN
INTRAVENOUS | Status: DC | PRN
  Administered 2015-07-07 (×2): via INTRAVENOUS

## 2015-07-07 MED ORDER — INFLUENZA VAC SPLIT QUAD 0.5 ML IM SUSY
0.5000 mL | PREFILLED_SYRINGE | INTRAMUSCULAR | Status: AC
  Administered 2015-07-08: 0.5 mL via INTRAMUSCULAR
  Filled 2015-07-07: qty 0.5

## 2015-07-07 MED ORDER — SODIUM CHLORIDE 0.9% FLUSH
3.0000 mL | INTRAVENOUS | Status: DC | PRN
  Administered 2015-07-08: 3 mL via INTRAVENOUS
  Filled 2015-07-07: qty 3

## 2015-07-07 MED ORDER — BUPIVACAINE HCL (PF) 0.25 % IJ SOLN
INTRAMUSCULAR | Status: DC | PRN
  Administered 2015-07-07: 5 mL

## 2015-07-07 MED ORDER — CEFAZOLIN SODIUM 1-5 GM-% IV SOLN
1.0000 g | Freq: Three times a day (TID) | INTRAVENOUS | Status: AC
  Administered 2015-07-07 – 2015-07-08 (×2): 1 g via INTRAVENOUS
  Filled 2015-07-07 (×2): qty 50

## 2015-07-07 MED ORDER — NEOSTIGMINE METHYLSULFATE 10 MG/10ML IV SOLN
INTRAVENOUS | Status: AC
  Filled 2015-07-07: qty 1

## 2015-07-07 MED ORDER — HYDROCHLOROTHIAZIDE 25 MG PO TABS
25.0000 mg | ORAL_TABLET | Freq: Every day | ORAL | Status: DC
  Administered 2015-07-08 – 2015-07-09 (×2): 25 mg via ORAL
  Filled 2015-07-07 (×2): qty 1

## 2015-07-07 MED ORDER — EPHEDRINE SULFATE 50 MG/ML IJ SOLN
INTRAMUSCULAR | Status: DC | PRN
  Administered 2015-07-07 (×3): 10 mg via INTRAVENOUS
  Administered 2015-07-07: 5 mg via INTRAVENOUS

## 2015-07-07 MED ORDER — MIDAZOLAM HCL 2 MG/2ML IJ SOLN
INTRAMUSCULAR | Status: AC
  Filled 2015-07-07: qty 2

## 2015-07-07 MED ORDER — ONDANSETRON HCL 4 MG/2ML IJ SOLN
INTRAMUSCULAR | Status: DC | PRN
  Administered 2015-07-07: 4 mg via INTRAVENOUS

## 2015-07-07 MED ORDER — SUCCINYLCHOLINE CHLORIDE 20 MG/ML IJ SOLN
INTRAMUSCULAR | Status: DC | PRN
  Administered 2015-07-07: 100 mg via INTRAVENOUS

## 2015-07-07 MED ORDER — 0.9 % SODIUM CHLORIDE (POUR BTL) OPTIME
TOPICAL | Status: DC | PRN
  Administered 2015-07-07: 1000 mL

## 2015-07-07 MED ORDER — PROMETHAZINE HCL 25 MG/ML IJ SOLN: 6.2500 mg | INTRAMUSCULAR | Status: DC | PRN

## 2015-07-07 MED ORDER — PHENOL 1.4 % MT LIQD: 1.0000 | OROMUCOSAL | Status: DC | PRN

## 2015-07-07 MED ORDER — LOSARTAN POTASSIUM 50 MG PO TABS
100.0000 mg | ORAL_TABLET | Freq: Every day | ORAL | Status: DC
  Administered 2015-07-08 – 2015-07-09 (×2): 100 mg via ORAL
  Filled 2015-07-07 (×2): qty 2

## 2015-07-07 MED ORDER — TAMSULOSIN HCL 0.4 MG PO CAPS
0.4000 mg | ORAL_CAPSULE | Freq: Every day | ORAL | Status: DC
  Administered 2015-07-07 – 2015-07-08 (×2): 0.4 mg via ORAL
  Filled 2015-07-07 (×2): qty 1

## 2015-07-07 MED ORDER — SODIUM CHLORIDE 0.9% FLUSH
3.0000 mL | Freq: Two times a day (BID) | INTRAVENOUS | Status: DC
  Administered 2015-07-07 – 2015-07-09 (×3): 3 mL via INTRAVENOUS

## 2015-07-07 MED ORDER — HYDROMORPHONE HCL 1 MG/ML IJ SOLN
0.2500 mg | INTRAMUSCULAR | Status: DC | PRN
  Administered 2015-07-07 (×2): 0.5 mg via INTRAVENOUS

## 2015-07-07 MED ORDER — ROCURONIUM BROMIDE 50 MG/5ML IV SOLN
INTRAVENOUS | Status: AC
  Filled 2015-07-07: qty 1

## 2015-07-07 MED ORDER — BACITRACIN 50000 UNITS IM SOLR
INTRAMUSCULAR | Status: DC | PRN
  Administered 2015-07-07 (×2): 500 mL

## 2015-07-07 MED ORDER — SODIUM CHLORIDE 0.9 % IV SOLN: 250.0000 mL | INTRAVENOUS | Status: DC

## 2015-07-07 MED ORDER — PHENYLEPHRINE HCL 10 MG/ML IJ SOLN
10.0000 mg | INTRAMUSCULAR | Status: DC | PRN
  Administered 2015-07-07 (×2): 10 ug/min via INTRAVENOUS

## 2015-07-07 MED ORDER — ASPIRIN 81 MG PO CHEW
81.0000 mg | CHEWABLE_TABLET | Freq: Every day | ORAL | Status: DC
  Administered 2015-07-08 – 2015-07-09 (×2): 81 mg via ORAL
  Filled 2015-07-07 (×3): qty 1

## 2015-07-07 MED ORDER — CELECOXIB 200 MG PO CAPS
200.0000 mg | ORAL_CAPSULE | Freq: Two times a day (BID) | ORAL | Status: DC
  Administered 2015-07-07 – 2015-07-09 (×4): 200 mg via ORAL
  Filled 2015-07-07 (×4): qty 1

## 2015-07-07 MED ORDER — POLYETHYLENE GLYCOL 3350 17 G PO PACK: 17.0000 g | PACK | Freq: Every day | ORAL | Status: DC | PRN

## 2015-07-07 MED ORDER — ROCURONIUM BROMIDE 100 MG/10ML IV SOLN
INTRAVENOUS | Status: DC | PRN
  Administered 2015-07-07: 10 mg via INTRAVENOUS
  Administered 2015-07-07: 20 mg via INTRAVENOUS
  Administered 2015-07-07 (×2): 30 mg via INTRAVENOUS
  Administered 2015-07-07: 50 mg via INTRAVENOUS

## 2015-07-07 MED ORDER — LACTATED RINGERS IV SOLN
INTRAVENOUS | Status: DC
  Administered 2015-07-07 (×3): via INTRAVENOUS

## 2015-07-07 MED ORDER — GLYCOPYRROLATE 0.2 MG/ML IJ SOLN
INTRAMUSCULAR | Status: AC
  Filled 2015-07-07: qty 2

## 2015-07-07 MED ORDER — MAGNESIUM CITRATE PO SOLN: 1.0000 | Freq: Once | ORAL | Status: DC | PRN

## 2015-07-07 MED ORDER — THROMBIN 5000 UNITS EX SOLR
OROMUCOSAL | Status: DC | PRN
  Administered 2015-07-07 (×2): 5 mL via TOPICAL

## 2015-07-07 MED ORDER — ACETAMINOPHEN 10 MG/ML IV SOLN
INTRAVENOUS | Status: AC
  Administered 2015-07-07: 1000 mg via INTRAVENOUS
  Filled 2015-07-07: qty 100

## 2015-07-07 MED ORDER — MIDAZOLAM HCL 5 MG/5ML IJ SOLN
INTRAMUSCULAR | Status: DC | PRN
  Administered 2015-07-07: 2 mg via INTRAVENOUS

## 2015-07-07 MED ORDER — NEOSTIGMINE METHYLSULFATE 10 MG/10ML IV SOLN
INTRAVENOUS | Status: DC | PRN
  Administered 2015-07-07: 3 mg via INTRAVENOUS

## 2015-07-07 MED ORDER — GLYCOPYRROLATE 0.2 MG/ML IJ SOLN
INTRAMUSCULAR | Status: DC | PRN
  Administered 2015-07-07: 0.4 mg via INTRAVENOUS

## 2015-07-07 MED ORDER — VANCOMYCIN HCL 1000 MG IV SOLR
INTRAVENOUS | Status: AC
  Filled 2015-07-07: qty 1000

## 2015-07-07 MED ORDER — FENTANYL CITRATE (PF) 250 MCG/5ML IJ SOLN
INTRAMUSCULAR | Status: AC
  Filled 2015-07-07: qty 5

## 2015-07-07 MED ORDER — POTASSIUM CHLORIDE IN NACL 20-0.9 MEQ/L-% IV SOLN
INTRAVENOUS | Status: DC
  Administered 2015-07-07: 75 mL/h via INTRAVENOUS
  Filled 2015-07-07 (×5): qty 1000

## 2015-07-07 MED ORDER — ACETAMINOPHEN 325 MG PO TABS: 650.0000 mg | ORAL_TABLET | ORAL | Status: DC | PRN

## 2015-07-07 MED ORDER — LIDOCAINE HCL (CARDIAC) 20 MG/ML IV SOLN
INTRAVENOUS | Status: DC | PRN
  Administered 2015-07-07: 80 mg via INTRAVENOUS

## 2015-07-07 MED ORDER — ACETAMINOPHEN 650 MG RE SUPP: 650.0000 mg | RECTAL | Status: DC | PRN

## 2015-07-07 MED ORDER — ONDANSETRON HCL 4 MG/2ML IJ SOLN: 4.0000 mg | INTRAMUSCULAR | Status: DC | PRN

## 2015-07-07 MED ORDER — HYDROMORPHONE HCL 1 MG/ML IJ SOLN
INTRAMUSCULAR | Status: AC
  Filled 2015-07-07: qty 1

## 2015-07-07 MED ORDER — CEFAZOLIN SODIUM-DEXTROSE 2-3 GM-% IV SOLR
INTRAVENOUS | Status: AC
  Filled 2015-07-07: qty 50

## 2015-07-07 MED ORDER — MENTHOL 3 MG MT LOZG: 1.0000 | LOZENGE | OROMUCOSAL | Status: DC | PRN

## 2015-07-07 MED ORDER — VANCOMYCIN HCL 1000 MG IV SOLR
INTRAVENOUS | Status: DC | PRN
Start: 2015-07-07 — End: 2015-07-07
  Administered 2015-07-07: 1000 mg via TOPICAL

## 2015-07-07 MED ORDER — PNEUMOCOCCAL VAC POLYVALENT 25 MCG/0.5ML IJ INJ
0.5000 mL | INJECTION | INTRAMUSCULAR | Status: DC
  Filled 2015-07-07: qty 0.5

## 2015-07-07 MED ORDER — MORPHINE SULFATE (PF) 2 MG/ML IV SOLN
1.0000 mg | INTRAVENOUS | Status: DC | PRN
  Administered 2015-07-08: 2 mg via INTRAVENOUS
  Administered 2015-07-08: 4 mg via INTRAVENOUS
  Filled 2015-07-07: qty 2
  Filled 2015-07-07: qty 1

## 2015-07-07 MED ORDER — LOSARTAN POTASSIUM-HCTZ 100-25 MG PO TABS: 1.0000 | ORAL_TABLET | Freq: Every morning | ORAL | Status: DC

## 2015-07-07 MED ORDER — SENNA 8.6 MG PO TABS
1.0000 | ORAL_TABLET | Freq: Two times a day (BID) | ORAL | Status: DC
  Administered 2015-07-07 – 2015-07-09 (×4): 8.6 mg via ORAL
  Filled 2015-07-07 (×4): qty 1

## 2015-07-07 MED ORDER — PROPOFOL 10 MG/ML IV BOLUS
INTRAVENOUS | Status: DC | PRN
  Administered 2015-07-07: 200 mg via INTRAVENOUS

## 2015-07-07 MED ORDER — CYCLOBENZAPRINE HCL 10 MG PO TABS
10.0000 mg | ORAL_TABLET | Freq: Three times a day (TID) | ORAL | Status: DC | PRN
  Administered 2015-07-07 – 2015-07-08 (×2): 10 mg via ORAL
  Filled 2015-07-07 (×2): qty 1

## 2015-07-07 MED ORDER — FENTANYL CITRATE (PF) 100 MCG/2ML IJ SOLN
INTRAMUSCULAR | Status: DC | PRN
  Administered 2015-07-07: 50 ug via INTRAVENOUS
  Administered 2015-07-07: 150 ug via INTRAVENOUS
  Administered 2015-07-07 (×2): 50 ug via INTRAVENOUS
  Administered 2015-07-07: 25 ug via INTRAVENOUS
  Administered 2015-07-07: 50 ug via INTRAVENOUS
  Administered 2015-07-07 (×3): 25 ug via INTRAVENOUS
  Administered 2015-07-07: 50 ug via INTRAVENOUS
  Administered 2015-07-07 (×2): 25 ug via INTRAVENOUS

## 2015-07-07 MED ORDER — PROPOFOL 10 MG/ML IV BOLUS
INTRAVENOUS | Status: AC
  Filled 2015-07-07: qty 20

## 2015-07-07 MED ORDER — THROMBIN 20000 UNITS EX SOLR
CUTANEOUS | Status: DC | PRN
  Administered 2015-07-07: 20 mL via TOPICAL

## 2015-07-07 MED ORDER — FINASTERIDE 5 MG PO TABS
5.0000 mg | ORAL_TABLET | Freq: Every day | ORAL | Status: DC
  Administered 2015-07-07 – 2015-07-09 (×3): 5 mg via ORAL
  Filled 2015-07-07 (×3): qty 1

## 2015-07-07 SURGICAL SUPPLY — 71 items
ADH SKN CLS APL DERMABOND .7 (GAUZE/BANDAGES/DRESSINGS) ×1
APL SKNCLS STERI-STRIP NONHPOA (GAUZE/BANDAGES/DRESSINGS) ×1
BAG DECANTER FOR FLEXI CONT (MISCELLANEOUS) ×3 IMPLANT
BENZOIN TINCTURE PRP APPL 2/3 (GAUZE/BANDAGES/DRESSINGS) ×3 IMPLANT
BIT DRILL PLIF MAS 5.0MM DISP (DRILL) ×1 IMPLANT
BLADE CLIPPER SURG (BLADE) IMPLANT
BONE CANC CHIPS 40CC CAN1/2 (Bone Implant) ×3 IMPLANT
BUR MATCHSTICK NEURO 3.0 LAGG (BURR) ×3 IMPLANT
CAGE COROENT 10X9X28-8 LUMBAR (Cage) ×6 IMPLANT
CANISTER SUCT 3000ML PPV (MISCELLANEOUS) ×3 IMPLANT
CHIPS CANC BONE 40CC CAN1/2 (Bone Implant) ×1 IMPLANT
CLEANER TIP ELECTROSURG 2X2 (MISCELLANEOUS) ×3 IMPLANT
CLOSURE WOUND 1/2 X4 (GAUZE/BANDAGES/DRESSINGS) ×1
CONT SPEC 4OZ CLIKSEAL STRL BL (MISCELLANEOUS) ×3 IMPLANT
COVER BACK TABLE 24X17X13 BIG (DRAPES) IMPLANT
COVER BACK TABLE 60X90IN (DRAPES) ×3 IMPLANT
DERMABOND ADVANCED (GAUZE/BANDAGES/DRESSINGS) ×2
DERMABOND ADVANCED .7 DNX12 (GAUZE/BANDAGES/DRESSINGS) ×1 IMPLANT
DRAPE C-ARM 42X72 X-RAY (DRAPES) ×3 IMPLANT
DRAPE C-ARMOR (DRAPES) ×3 IMPLANT
DRAPE LAPAROTOMY 100X72X124 (DRAPES) ×3 IMPLANT
DRAPE POUCH INSTRU U-SHP 10X18 (DRAPES) ×3 IMPLANT
DRAPE SURG 17X23 STRL (DRAPES) ×3 IMPLANT
DRILL PLIF MAS 5.0MM DISP (DRILL) ×3
DRSG OPSITE 4X5.5 SM (GAUZE/BANDAGES/DRESSINGS) ×3 IMPLANT
DRSG OPSITE POSTOP 4X8 (GAUZE/BANDAGES/DRESSINGS) ×3 IMPLANT
DURAPREP 26ML APPLICATOR (WOUND CARE) ×3 IMPLANT
ELECT REM PT RETURN 9FT ADLT (ELECTROSURGICAL) ×3
ELECTRODE REM PT RTRN 9FT ADLT (ELECTROSURGICAL) ×1 IMPLANT
EVACUATOR 1/8 PVC DRAIN (DRAIN) ×3 IMPLANT
GAUZE SPONGE 4X4 16PLY XRAY LF (GAUZE/BANDAGES/DRESSINGS) ×3 IMPLANT
GLOVE BIO SURGEON STRL SZ8 (GLOVE) ×9 IMPLANT
GLOVE BIOGEL PI IND STRL 7.0 (GLOVE) ×5 IMPLANT
GLOVE BIOGEL PI INDICATOR 7.0 (GLOVE) ×10
GLOVE INDICATOR 6.5 STRL GRN (GLOVE) ×3 IMPLANT
GLOVE SURG SS PI 6.5 STRL IVOR (GLOVE) ×15 IMPLANT
GOWN STRL REUS W/ TWL LRG LVL3 (GOWN DISPOSABLE) ×1 IMPLANT
GOWN STRL REUS W/ TWL XL LVL3 (GOWN DISPOSABLE) ×4 IMPLANT
GOWN STRL REUS W/TWL 2XL LVL3 (GOWN DISPOSABLE) IMPLANT
GOWN STRL REUS W/TWL LRG LVL3 (GOWN DISPOSABLE) ×3
GOWN STRL REUS W/TWL XL LVL3 (GOWN DISPOSABLE) ×12
HEMOSTAT POWDER KIT SURGIFOAM (HEMOSTASIS) ×6 IMPLANT
KIT BASIN OR (CUSTOM PROCEDURE TRAY) ×3 IMPLANT
KIT ROOM TURNOVER OR (KITS) ×3 IMPLANT
MILL MEDIUM DISP (BLADE) ×3 IMPLANT
NEEDLE HYPO 25X1 1.5 SAFETY (NEEDLE) ×3 IMPLANT
NS IRRIG 1000ML POUR BTL (IV SOLUTION) ×3 IMPLANT
PACK LAMINECTOMY NEURO (CUSTOM PROCEDURE TRAY) ×3 IMPLANT
PAD ARMBOARD 7.5X6 YLW CONV (MISCELLANEOUS) ×18 IMPLANT
PUTTY BONE ATTRAX 10CC STRIP (Putty) ×3 IMPLANT
ROD FIXATION PLIF 85MM PREBENT (Rod) ×6 IMPLANT
SCREW LOCK (Screw) ×24 IMPLANT
SCREW LOCK FXNS SPNE MAS PL (Screw) ×8 IMPLANT
SCREW MAS PLIF 5.5X30 (Screw) ×4 IMPLANT
SCREW PAS PLIF 5X30 (Screw) ×2 IMPLANT
SCREW PLIF MAS 5.0X35 (Screw) ×18 IMPLANT
SCREW SHANK 5.0X35 (Screw) ×3 IMPLANT
SCREW TULIP 5.5 (Screw) ×3 IMPLANT
SPONGE LAP 4X18 X RAY DECT (DISPOSABLE) ×3 IMPLANT
SPONGE SURGIFOAM ABS GEL 100 (HEMOSTASIS) ×3 IMPLANT
STRIP CLOSURE SKIN 1/2X4 (GAUZE/BANDAGES/DRESSINGS) ×2 IMPLANT
SUT VIC AB 0 CT1 18XCR BRD8 (SUTURE) ×2 IMPLANT
SUT VIC AB 0 CT1 8-18 (SUTURE) ×6
SUT VIC AB 2-0 CP2 18 (SUTURE) ×3 IMPLANT
SUT VIC AB 3-0 SH 8-18 (SUTURE) ×6 IMPLANT
SYR 3ML LL SCALE MARK (SYRINGE) IMPLANT
TOWEL OR 17X24 6PK STRL BLUE (TOWEL DISPOSABLE) ×3 IMPLANT
TOWEL OR 17X26 10 PK STRL BLUE (TOWEL DISPOSABLE) ×6 IMPLANT
TRAP SPECIMEN MUCOUS 40CC (MISCELLANEOUS) ×3 IMPLANT
TRAY FOLEY W/METER SILVER 14FR (SET/KITS/TRAYS/PACK) ×3 IMPLANT
WATER STERILE IRR 1000ML POUR (IV SOLUTION) ×3 IMPLANT

## 2015-07-07 NOTE — Op Note (Signed)
07/07/2015  5:54 PM  PATIENT:  Kyle Lewis  70 y.o. male  PRE-OPERATIVE DIAGNOSIS:  Spondylolisthesis L4-5, severe spinal stenosis L2-3 L3-4 L4-5, back and leg pain  POST-OPERATIVE DIAGNOSIS:  Same  PROCEDURE:   1. Decompressive lumbar laminectomy L2-3 L3-4 and L4-5 requiring more work than would be required for a simple exposure of the disk for PLIF in order to adequately decompress the neural elements and address the spinal stenosis, note that 2 of the decompression levels were separate from the interbody fusion level 2. Posterior lumbar interbody fusion L4-5 using PEEK interbody cages packed with morcellized allograft and autograft 3. Posterior fixation L2-L5 using nuvasive cortical pedicle screws.  4. Intertransverse arthrodesis L2-L5 using morcellized autograft and allograft.  SURGEON:  Sherley Bounds, MD  ASSISTANTS: Dr. Saintclair Halsted  ANESTHESIA:  General  EBL: 1400 ml  Total I/O In: 3150 [I.V.:2000; Blood:650; IV [id number] Out: [id number] [Urine:395; Blood:1400]  BLOOD ADMINISTERED:650 CC CELLSAVER  DRAINS: Hemovac   INDICATION FOR PROCEDURE: This patient presented with severe back pain and left leg pain. MRI showed severe spondylosis with severe spinal stenosis L2 through L3-4 L4-5 with a spondylolisthesis L4-5. There was retrolisthesis of L2-3 and L3-4. He tried medical management without relief. I recommended decompression and instrumented fusion. We considered only fusing L4-5 with decompressions at L2-3 and L3-4, but during surgery it appeared that he had instability at L2-3 and L3-4 with severe spondylosis and facet arthropathy and since we were performing full laminectomies of L2-3 and L3-4 above a fusion I felt it was important to go ahead and fuse him up to L2. Patient understood the risks, benefits, and alternatives and potential outcomes and wished to proceed.  PROCEDURE DETAILS:  The patient was brought to the operating room. After induction of generalized endotracheal  anesthesia the patient was rolled into the prone position on chest rolls and all pressure points were padded. The patient's lumbar region was cleaned and then prepped with DuraPrep and draped in the usual sterile fashion. Anesthesia was injected and then a dorsal midline incision was made and carried down to the lumbosacral fascia. The fascia was opened and the paraspinous musculature was taken down in a subperiosteal fashion to expose L2-3 L3-4 and L4-5. A self-retaining retractor was placed. Intraoperative fluoroscopy confirmed my level, and I started with placement of the L2 cortical pedicle screws. The pedicle screw entry zones were identified utilizing surface landmarks and  AP and lateral fluoroscopy. I scored the cortex with the high-speed drill and then used the hand drill and EMG monitoring to drill an upward and outward direction into the pedicle. I then tapped line to line, and the tap was also monitored. I then placed a 5-0 x 35 mm cortical pedicle screw into the pedicles of L2 bilaterally. I then turned my attention to the decompression and the spinous process was removed and complete lumbar laminectomies, hemi- facetectomies, and foraminotomies were performed at L2-3 L3-4 and L4-5. The patient had significant spinal stenosis and this required more work than would be required for a simple exposure of the disc for posterior lumbar interbody fusion at L4-5. There was severe spondylosis and overgrown facet. Much more generous decompression was undertaken in order to adequately decompress the neural elements and address the patient's leg pain. The yellow ligament was removed to expose the underlying dura and nerve roots, and generous foraminotomies were performed to adequately decompress the neural elements. Both the exiting and traversing nerve roots were decompressed on both sides until a coronary dilator passed easily along  the nerve roots. There was a large disc herniation at L4-5 on the left with a  superior free fragment and an extraforaminal fragment. These were removed. Once the decompression was complete, I turned my attention to the posterior lower lumbar interbody fusion. The epidural venous vasculature was coagulated and cut sharply. Disc space was incised and the initial discectomy was performed with pituitary rongeurs. The disc space was distracted with sequential distractors to a height of 10 mm. We then used a series of scrapers and shavers to prepare the endplates for fusion. The midline was prepared with Epstein curettes. Once the complete discectomy was finished, we packed an appropriate sized peek interbody cage with local autograft and morcellized allograft, gently retracted the nerve root, and tapped the cage into position at L4-5 bilaterally.  The midline between the cages was packed with morselized autograft and allograft. We then turned our attention to the placement of the lower pedicle screws. The pedicle screw entry zones were identified utilizing surface landmarks and fluoroscopy. I drilled into each pedicle utilizing the hand drill and EMG monitoring, and tapped each pedicle with the appropriate tap. We palpated with a ball probe to assure no break in the cortex. We then placed 5-0 x 35 mm cortical pedicle screws into the pedicles bilaterally at L3-L4 and L5. We then decorticated the transverse processes and laid a mixture of morcellized autograft and allograft out over these to perform intertransverse arthrodesis at L2-L5 bilaterally. We then placed lordotic rods into the multiaxial screw heads of the pedicle screws and locked these in position with the locking caps and anti-torque device. We then checked our construct with AP and lateral fluoroscopy. Irrigated with copious amounts of bacitracin-containing saline solution. Placed a medium Hemovac drain through separate stab incision. Inspected the nerve roots once again to assure adequate decompression, lined to the dura with Gelfoam,  and closed the muscle and the fascia with 0 Vicryl. Closed the subcutaneous tissues with 2-0 Vicryl and subcuticular tissues with 3-0 Vicryl. The skin was closed with benzoin and Steri-Strips. Dressing was then applied, the patient was awakened from general anesthesia and transported to the recovery room in stable condition. At the end of the procedure all sponge, needle and instrument counts were correct.   PLAN OF CARE: Admit to inpatient   PATIENT DISPOSITION:  PACU - hemodynamically stable.   Delay start of Pharmacological VTE agent (>24hrs) due to surgical blood loss or risk of bleeding:  yes

## 2015-07-07 NOTE — Anesthesia Preprocedure Evaluation (Addendum)
Anesthesia Evaluation  Patient identified by MRN, date of birth, ID band Patient awake    Reviewed: Allergy & Precautions, NPO status , Patient's Chart, lab work & pertinent test results  History of Anesthesia Complications Negative for: history of anesthetic complications  Airway Mallampati: II  TM Distance: >3 FB Neck ROM: Full    Dental  (+) Teeth Intact, Dental Advisory Given   Pulmonary sleep apnea and Continuous Positive Airway Pressure Ventilation , COPD,  COPD inhaler, Current Smoker,    breath sounds clear to auscultation       Cardiovascular hypertension, + Peripheral Vascular Disease   Rhythm:Regular Rate:Normal  Cleared in recent cardiology eval , notes in chart Other hx notale for Pvd atheroclerosis   Neuro/Psych Depression CVA, No Residual Symptoms    GI/Hepatic negative GI ROS, Neg liver ROS,   Endo/Other  Morbid obesity  Renal/GU negative Renal ROS     Musculoskeletal  (+) Arthritis ,   Abdominal (+) + obese,  Abdomen: soft. Bowel sounds: normal.  Peds  Hematology   Anesthesia Other Findings   Reproductive/Obstetrics                         Anesthesia Physical Anesthesia Plan  ASA: III  Anesthesia Plan: General   Post-op Pain Management:    Induction: Intravenous  Airway Management Planned: Oral ETT  Additional Equipment: Arterial line  Intra-op Plan:   Post-operative Plan: Extubation in OR  Informed Consent: I have reviewed the patients History and Physical, chart, labs and discussed the procedure including the risks, benefits and alternatives for the proposed anesthesia with the patient or authorized representative who has indicated his/her understanding and acceptance.   Dental advisory given  Plan Discussed with: CRNA and Surgeon  Anesthesia Plan Comments:         Anesthesia Quick Evaluation

## 2015-07-07 NOTE — Anesthesia Procedure Notes (Signed)
Procedure Name: Intubation Date/Time: 07/07/2015 1:10 PM Performed by: Lavell Luster Pre-anesthesia Checklist: Patient identified, Emergency Drugs available, Suction available, Patient being monitored and Timeout performed Patient Re-evaluated:Patient Re-evaluated prior to inductionOxygen Delivery Method: Circle system utilized Preoxygenation: Pre-oxygenation with 100% oxygen Intubation Type: IV induction Ventilation: Oral airway inserted - appropriate to patient size Laryngoscope Size: Sabra Heck and 2 Grade View: Grade II Tube type: Oral Tube size: 7.5 mm Number of attempts: 1 Airway Equipment and Method: Stylet Placement Confirmation: ETT inserted through vocal cords under direct vision,  breath sounds checked- equal and bilateral and positive ETCO2 Secured at: 24 cm Tube secured with: Tape Dental Injury: Teeth and Oropharynx as per pre-operative assessment

## 2015-07-07 NOTE — Transfer of Care (Signed)
Immediate Anesthesia Transfer of Care Note  Patient: Daneal Larock  Procedure(s) Performed: Procedure(s) with comments:  POSTERIOR LUMBAR INTERBODY FUSION (PLIF) Lumbar Four-Five with Pedicle Screw Fixation Lumbar Two-Five;Laminectomy Lumbar Two-Five (N/A) -  POSTERIOR LUMBAR INTERBODY FUSION (PLIF) Lumbar Four-Five with Pedicle Screw Fixation Lumbar Two-Five;Laminectomy Lumbar Two-Five  Patient Location: PACU  Anesthesia Type:General  Level of Consciousness: awake  Airway & Oxygen Therapy: Patient Spontanous Breathing and Patient connected to face mask oxygen  Post-op Assessment: Report given to RN, Post -op Vital signs reviewed and stable and Patient moving all extremities X 4  Post vital signs: stable  Last Vitals:  Filed Vitals:   07/07/15 1016  BP: 112/68  Pulse: 77  Temp: 36.2 C  Resp: 20    Complications: No apparent anesthesia complications

## 2015-07-07 NOTE — H&P (Signed)
Subjective: Patient is a 70 y.o. male admitted for DLL and PLIF. Onset of symptoms was several months ago, gradually worsening since that time.  The pain is rated severe, and is located at the across the lower back and radiates to LLE. The pain is described as aching and occurs all day. The symptoms have been progressive. Symptoms are exacerbated by exercise. MRI or CT showed spondylolisthesis with stenosis L4-5 with degenerative disc disease and retrolisthesis with stenosis L2-3 and L3-4. I think his symptomatic lesion is the large disc herniation at L4-5 on the left with severe stenosis.   Past Medical History  Diagnosis Date  . Diverticulosis   . Adenomatous colon polyp   . Sleep apnea     wears CPAP  . Allergy     seasonal  . Arthritis   . Cataract   . Hypertension   . Nocturia   . BPH (benign prostatic hyperplasia)   . Adrenal mass ([place])     2 benign appearing left adrenal adenomas noted on 01/13/15 CT  . Chronic back pain   . CVA (cerebral infarction)   . COPD (chronic obstructive pulmonary disease) ([place])   . Peripheral vascular disease ([place])   . Carotid artery occlusion   . Stroke [facility]) 2013    tia   . 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 Aortic stenosis     mild AS by 04/2012 echo    Past Surgical History  Procedure Laterality Date  . Umbilical hernia repair  2011  . Tonsillectomy    . Appendectomy    . Carpal tunnel release      left  . Cataracts      both eyes  . Inguinal hernia repair    . Transurethral resection of prostate    . Total knee arthroplasty  04/01/2012    Procedure: TOTAL KNEE ARTHROPLASTY;  Surgeon: Gearlean Alf, MD;  Location: [facility] ORS;  Service: Orthopedics;  Laterality: Left;  . Joint replacement Left 04-01-12    Knee  . Eye surgery Bilateral     Cataract  . Prostate surgery    . Brain meningioma excision      menigioma    Prior to Admission medications   Medication Sig Start Date End Date  Taking? Authorizing Provider  aspirin 81 MG tablet Take 81 mg by mouth daily. To start 06/30/15 after stop plavix   Yes Historical Provider, MD  B Complex-C (B-COMPLEX WITH VITAMIN C) tablet Take 1 tablet by mouth daily.   Yes Historical Provider, MD  Cholecalciferol (VITAMIN D-3) 5000 UNITS TABS Take 1 tablet by mouth daily.   Yes Historical Provider, MD  clopidogrel (PLAVIX) 75 MG tablet TAKE 1 TABLET BY MOUTH DAILY. 05/28/15  Yes Garvin Fila, MD  cyclobenzaprine (FLEXERIL) 10 MG tablet Take 10 mg by mouth 2 (two) times daily as needed for muscle spasms.   Yes Historical Provider, MD  finasteride (PROSCAR) 5 MG tablet Take 5 mg by mouth daily.   Yes Historical Provider, MD  losartan-hydrochlorothiazide (HYZAAR) 100-25 MG per tablet Take 1 tablet by mouth every morning.   Yes Historical Provider, MD  Multiple Vitamin (MULTIVITAMIN WITH MINERALS) TABS Take 1 tablet by mouth daily.   Yes Historical Provider, MD  oxyCODONE (OXY IR/ROXICODONE) 5 MG immediate release tablet Take 10 mg by mouth 2 (two) times daily.  04/04/12  Yes Arlee Muslim, PA-C  Tamsulosin HCl (FLOMAX) 0.4 MG CAPS Take  0.4 mg by mouth daily after supper.  06/07/11  Yes Historical Provider, MD  omega-3 acid ethyl esters (LOVAZA) 1 G capsule Take 2 g by mouth 2 (two) times daily.    Historical Provider, MD  vitamin C (ASCORBIC ACID) 500 MG tablet Take 500 mg by mouth daily.    Historical Provider, MD   No Known Allergies  Social History  Substance Use Topics  . Smoking status: Light Tobacco Smoker -- 0.50 packs/day for 20 years    Types: Cigarettes  . Smokeless tobacco: Never Used     Comment: pt states he is trying to quit  . Alcohol Use: Yes     Comment: rare    Family History  Problem Relation Age of Onset  . Prostate cancer Maternal Grandfather   . Colon cancer Neg Hx   . Esophageal cancer Neg Hx   . Rectal cancer Neg Hx   . Stomach cancer Neg Hx   . Heart disease Mother     After 18 yrs of age  . Hypertension  Mother   . Heart attack Mother   . Hyperlipidemia Mother   . Diabetes Father   . Heart attack Father   . Heart disease Father     NOT  before age 70- [facility]     Review of Systems  Positive ROS: neg  All other systems have been reviewed and were otherwise negative with the exception of those mentioned in the HPI and as above.  Objective: Vital signs in last 24 hours: Temp:  [97.2 F (36.2 C)] 97.2 F (36.2 C) (02/01 1016) Pulse Rate:  [77] 77 (02/01 1016) Resp:  [20] 20 (02/01 1016) BP: (112)/(68) 112/68 mmHg (02/01 1016) SpO2:  [94 %] 94 % (02/01 1016) Weight:  [113.569 kg (250 lb 6 oz)] 113.569 kg (250 lb 6 oz) (02/01 1016)  General Appearance: Alert, cooperative, no distress, appears stated age Head: Normocephalic, without obvious abnormality, atraumatic Eyes: PERRL, conjunctiva/corneas clear, EOM's intact    Neck: Supple, symmetrical, trachea midline Back: Symmetric, no curvature, ROM normal, no CVA tenderness Lungs:  respirations unlabored Heart: Regular rate and rhythm Abdomen: Soft, non-tender Extremities: Extremities normal, atraumatic, no cyanosis or edema Pulses: 2+ and symmetric all extremities Skin: Skin color, texture, turgor normal, no rashes or lesions  NEUROLOGIC:   Mental status: Alert and oriented x4,  no aphasia, good attention span, fund of knowledge, and memory Motor Exam - grossly normal Sensory Exam - grossly normal Reflexes: 1+ Coordination - grossly normal Gait - grossly normal Balance - grossly normal Cranial Nerves: I: smell Not tested  II: visual acuity  OS: nl    OD: nl  II: visual fields Full to confrontation  II: pupils Equal, round, reactive to light  III,VII: ptosis None  III,IV,VI: extraocular muscles  Full ROM  V: mastication Normal  V: facial light touch sensation  Normal  V,VII: corneal reflex  Present  VII: facial muscle function - upper  Normal  VII: facial muscle function - lower Normal  VIII: hearing Not tested  IX: soft  palate elevation  Normal  IX,X: gag reflex Present  XI: trapezius strength  5/5  XI: sternocleidomastoid strength 5/5  XI: neck flexion strength  5/5  XII: tongue strength  Normal    Data Review Lab Results  Component Value Date   WBC 9.1 06/29/2015   HGB 16.5 06/29/2015   HCT 47.0 06/29/2015   MCV 90.7 06/29/2015   PLT 259 06/29/2015   Lab Results  Component  Value Date   NA 137 06/29/2015   K 4.4 06/29/2015   CL 102 06/29/2015   CO2 27 06/29/2015   BUN 12 06/29/2015   CREATININE 0.62 06/29/2015   GLUCOSE 121* 06/29/2015   Lab Results  Component Value Date   INR 1.04 06/29/2015    Assessment/Plan: Patient admitted for posterior lumbar interbody fusion with his mentation L4-5, decompressive laminectomy L2 through L3-4.Marland Kitchen Patient has failed a reasonable attempt at conservative therapy.  I explained the condition and procedure to the patient and answered any questions.  Patient wishes to proceed with procedure as planned. Understands risks/ benefits and typical outcomes of procedure.   Kyle Lewis S 07/07/2015 12:37 PM

## 2015-07-08 ENCOUNTER — Encounter (HOSPITAL_COMMUNITY): Payer: Self-pay | Admitting: Neurological Surgery

## 2015-07-08 DIAGNOSIS — M4316 Spondylolisthesis, lumbar region: Secondary | ICD-10-CM | POA: Diagnosis not present

## 2015-07-08 LAB — GLUCOSE, CAPILLARY
Glucose-Capillary: 123 mg/dL — ABNORMAL HIGH (ref 65–99)
Glucose-Capillary: 118 mg/dL — ABNORMAL HIGH (ref 65–99)
Glucose-Capillary: 138 mg/dL — ABNORMAL HIGH (ref 65–99)
Glucose-Capillary: 143 mg/dL — ABNORMAL HIGH (ref 65–99)

## 2015-07-08 LAB — CBC
HCT: 36.9 % — ABNORMAL LOW (ref 39.0–52.0)
Hemoglobin: 12.9 g/dL — ABNORMAL LOW (ref 13.0–17.0)
MCH: 31.6 pg (ref 26.0–34.0)
MCHC: 35 g/dL (ref 30.0–36.0)
MCV: 90.4 fL (ref 78.0–100.0)
Platelets: 214 10*3/uL (ref 150–400)
RBC: 4.08 MIL/uL — ABNORMAL LOW (ref 4.22–5.81)
RDW: 13.7 % (ref 11.5–15.5)
WBC: 10.6 10*3/uL — ABNORMAL HIGH (ref 4.0–10.5)

## 2015-07-08 NOTE — Evaluation (Signed)
Physical Therapy Evaluation Patient Details Name: Kyle Lewis MRN: [id number] DOB: 07/29/1945 Today's Date: 07/08/2015   History of Present Illness  s/p Decompressive lumbar laminectomy L2-3 L3-4 and L4-5, arthrodesis L2-L5  PMHx- obesity, Lt TKR, CVA, COPD  Clinical Impression  Patient is s/p above surgery resulting in the deficits listed below (see PT Problem List). Patient moving well (almost too well, with some impulsivity!). Wife very concerned re: pt going home too soon and having incr pain POD 3 and she will not be able to physically assist him due to her back issues. Patient required education re: back precautions and mobility, however demonstrated good carryover with repetition. Anticipate d/c home with HHPT to assure pt is adhering to back precautions and safe use of DME. Patient will benefit from skilled PT to increase their independence and safety with mobility (while adhering to their precautions) to allow discharge to the venue listed below.     Follow Up Recommendations Home health PT;Supervision for mobility/OOB    Equipment Recommendations  None recommended by PT    Recommendations for Other Services OT consult     Precautions / Restrictions Precautions Precautions: Back Precaution Booklet Issued: Yes (comment) Precaution Comments: Educated pt and wife on precautions Required Braces or Orthoses: Spinal Brace Spinal Brace: Lumbar corset;Applied in sitting position Restrictions Weight Bearing Restrictions: No      Mobility  Bed Mobility Overal bed mobility: Needs Assistance Bed Mobility: Rolling;Sidelying to Sit;Sit to Sidelying Rolling: Supervision Sidelying to sit: Supervision     Sit to sidelying: Supervision General bed mobility comments: x2 in/out of bed; vc for technique with good follow-through  Transfers Overall transfer level: Needs assistance Equipment used: Rolling walker (2 wheeled) Transfers: Sit to/from Stand Sit to Stand: Min guard          General transfer comment: x 2; vc for safe use of RW  Ambulation/Gait Ambulation/Gait assistance: Supervision Ambulation Distance (Feet): 200 Feet Assistive device: Rolling walker (2 wheeled) Gait Pattern/deviations: Step-through pattern;Trunk flexed   Gait velocity interpretation: at or above normal speed for age/gender General Gait Details: vc for upright posture and proximity to [employer]:  (discussed, not a problem with rails)          Wheelchair Mobility    Modified Rankin (Stroke Patients Only)       Balance Overall balance assessment: No apparent balance deficits (not formally assessed)                                           Pertinent Vitals/Pain Pain Assessment: 0-10 Pain Score: 4  Pain Location: back Pain Descriptors / Indicators: Operative site guarding Pain Intervention(s): Limited activity within patient's tolerance;Monitored during session;Repositioned    Home Living Family/patient expects to be discharged to:: Private residence Living Arrangements: Spouse/significant other Available Help at Discharge: Family Type of Home: House Home Access: Stairs to enter Entrance Stairs-Rails: Right;Left;Can reach both Entrance Stairs-Number of Steps: 7 Home Layout: One level Home Equipment: Environmental consultant - 4 wheels;Shower seat;Hand held shower head;Grab bars - tub/shower (sink and windowsill)      Prior Function Level of Independence: Independent with assistive device(s)         Comments: using rollator     Hand Dominance        Extremity/Trunk Assessment   Upper Extremity Assessment: Defer to OT evaluation;Overall [facility] for tasks assessed  Lower Extremity Assessment: Overall WFL for tasks assessed      Cervical / Trunk Assessment: Other exceptions  Communication   Communication: No difficulties  Cognition Arousal/Alertness: Awake/alert Behavior During Therapy: WFL for tasks  assessed/performed;Impulsive Overall Cognitive Status: Within Functional Limits for tasks assessed                      General Comments General comments (skin integrity, edema, etc.): Wife concerned re: caring for pt due to her h/o back surgery. Eager for pt to go to SNF short-term. Educated that he would only be covered by insurance while he needs therapy--which would not be many days (if at all).     Exercises        Assessment/Plan    PT Assessment Patient needs continued PT services  PT Diagnosis Difficulty walking;Acute pain   PT Problem List Decreased mobility;Decreased knowledge of use of DME;Decreased knowledge of precautions;Obesity;Pain  PT Treatment Interventions DME instruction;Gait training;Functional mobility training;Therapeutic activities;Patient/family education   PT Goals (Current goals can be found in the Care Plan section) Acute Rehab PT Goals Patient Stated Goal: regain upright posture PT Goal Formulation: With patient Time For Goal Achievement: 07/10/15 Potential to Achieve Goals: Good    Frequency Min 5X/week   Barriers to discharge Decreased caregiver support wife can only provide supervision    Co-evaluation               End of Session Equipment Utilized During Treatment: Back brace Activity Tolerance: Patient tolerated treatment well Patient left: in chair;with call bell/phone within reach;with chair alarm set;with family/visitor present Nurse Communication: Mobility status         Time: [id number] PT Time Calculation (min) (ACUTE ONLY): 37 min   Charges:   PT Evaluation $PT Eval Moderate Complexity: 1 Procedure PT Treatments $Gait Training: 8-22 mins   PT G Codes:        Kyle Lewis 07/26/15, 9:31 AM  Pager [phone]

## 2015-07-08 NOTE — Anesthesia Postprocedure Evaluation (Signed)
Anesthesia Post Note  Patient: Kyle Lewis  Procedure(s) Performed: Procedure(s) (LRB):  POSTERIOR LUMBAR INTERBODY FUSION (PLIF) Lumbar Four-Five with Pedicle Screw Fixation Lumbar Two-Five;Laminectomy Lumbar Two-Five (N/A)  Patient location during evaluation: PACU Anesthesia Type: General Level of consciousness: awake and alert Pain management: pain level controlled Vital Signs Assessment: post-procedure vital signs reviewed and stable Respiratory status: spontaneous breathing, nonlabored ventilation and respiratory function stable Cardiovascular status: blood pressure returned to baseline and stable Postop Assessment: no signs of nausea or vomiting Anesthetic complications: no    Last Vitals:  Filed Vitals:   07/08/15 0951 07/08/15 1440  BP: 107/62 121/62  Pulse: 95 86  Temp: 36.6 C 36.6 C  Resp: 20 20    Last Pain:  Filed Vitals:   07/08/15 1442  PainSc: 5                  Ajai Terhaar,W. EDMOND

## 2015-07-08 NOTE — Progress Notes (Signed)
Utilization review completed.  

## 2015-07-08 NOTE — Evaluation (Signed)
Occupational Therapy Evaluation Patient Details Name: Kyle Lewis MRN: [id number] DOB: 1946/04/09 Today's Date: 07/08/2015    History of Present Illness s/p Decompressive lumbar laminectomy L2-3 L3-4 and L4-5, arthrodesis L2-L5  PMHx- obesity, Lt TKR, CVA, COPD   Clinical Impression   Pt reports he was independent with ADLs PTA. Currently pt overall supervision for safety with ADLs and functional mobility with the exception of min assist for LB ADLs. Began back, safety, and ADL education with pt and wife; they verbalize understanding. Pt planning to d/c home with 24/7 supervision; wife reports she is unable to physically assist due to hx of back problems. Pt would benefit from continued skilled OT to address established goals.     Follow Up Recommendations  No OT follow up;Supervision - Intermittent    Equipment Recommendations  Other (comment) (long handled sponge)    Recommendations for Other Services       Precautions / Restrictions Precautions Precautions: Back Precaution Booklet Issued: Yes (comment) Precaution Comments: Pt able to recall 3/3 back precautions and maintain throughout functional activities during session. Required Braces or Orthoses: Spinal Brace Spinal Brace: Lumbar corset;Applied in sitting position Restrictions Weight Bearing Restrictions: No      Mobility Bed Mobility         General bed mobility comments: Pt OOB in chair  Transfers Overall transfer level: Needs assistance Equipment used: 1 person hand held assist Transfers: Sit to/from Stand Sit to Stand: Min guard         General transfer comment: Discussed use of RW for functional mobility in home for safety. Min guard for safety. Sit to stand from chair x 1.    Balance Overall balance assessment: No apparent balance deficits (not formally assessed)                                          ADL Overall ADL's : Needs assistance/impaired Eating/Feeding: Set  up;Sitting   Grooming: Supervision/safety;Standing;Wash/dry hands Grooming Details (indicate cue type and reason): Educated on use of cup for oral care.     Lower Body Bathing: Minimal assistance;Sit to/from stand Lower Body Bathing Details (indicate cue type and reason): Educated on use of long handled sponge for increased independence with LB bathing; pt and wife verbalize understanding.   Upper Body Dressing Details (indicate cue type and reason): Back brace already donned. Pt and wife report no difficulties with donning/doffing brace. Lower Body Dressing: Minimal assistance;Sit to/from stand Lower Body Dressing Details (indicate cue type and reason): Pt unable to cross one foot over opposite knee. Pt reports that he has reacher at home to assist with LB ADLs. Pt reports that he does not wear socks, just slip on shoes.  Toilet Transfer: Supervision/safety;Ambulation Toilet Transfer Details (indicate cue type and reason): Pt able to stand at toilet to void. Educated on use of 3 in 1 over toilet for height and handles to push off from when performing sit to stand; pt and wife verbalize understanding. Toileting- Clothing Manipulation and Hygiene: Supervision/safety;Sit to/from stand       Functional mobility during ADLs: Supervision/safety General ADL Comments: Pts wife present for OT eval. Educated on use of RW for functional mobility in home, supervision for shower transfers, maintaining precautions during functional activities, keeping frequently used items at counter top height; pt verbalized understanding.     Vision     Perception     Praxis  Pertinent Vitals/Pain Pain Assessment: 0-10 Pain Score: 3  Pain Location: back Pain Descriptors / Indicators: Sore;Operative site guarding Pain Intervention(s): Limited activity within patient's tolerance;Monitored during session;Repositioned     Hand Dominance Right   Extremity/Trunk Assessment Upper Extremity Assessment Upper  Extremity Assessment: Overall WFL for tasks assessed;RUE deficits/detail RUE Deficits / Details: Slight numbness in tips of digits 1, 2, 3.   Lower Extremity Assessment Lower Extremity Assessment: Defer to PT evaluation   Cervical / Trunk Assessment Cervical / Trunk Assessment: Other exceptions Cervical / Trunk Exceptions: obese, apple shape   Communication Communication Communication: No difficulties   Cognition Arousal/Alertness: Awake/alert Behavior During Therapy: WFL for tasks assessed/performed;Impulsive Overall Cognitive Status: Within Functional Limits for tasks assessed                     General Comments       Exercises      Shoulder Instructions      Home Living Family/patient expects to be discharged to:: Private residence Living Arrangements: Spouse/significant other Available Help at Discharge: Family Type of Home: House Home Access: Stairs to enter Technical brewer of Steps: 7 Entrance Stairs-Rails: Right;Left;Can reach both [place]: One level     Bathroom Shower/Tub: Occupational psychologist: Handicapped height Bathroom Accessibility: No   Home Equipment: Environmental consultant - 4 wheels;Shower seat;Hand held shower head;Grab bars - tub/shower;Adaptive equipment;Bedside commode (sink and windowsill) Adaptive Equipment: Reacher        Prior Functioning/Environment Level of Independence: Independent with assistive device(s)        Comments: using rollator    OT Diagnosis: Acute pain   OT Problem List: Decreased safety awareness;Decreased knowledge of use of DME or AE;Decreased knowledge of precautions;Obesity;Pain   OT Treatment/Interventions: Self-care/ADL training;Energy conservation;DME and/or AE instruction;Patient/family education    OT Goals(Current goals can be found in the care plan section) Acute Rehab OT Goals Patient Stated Goal: regain upright posture OT Goal Formulation: With patient Time For Goal Achievement:  07/22/15 Potential to Achieve Goals: Good ADL Goals Pt Will Perform Lower Body Dressing: with supervision;with adaptive equipment;sit to/from stand Pt Will Transfer to Toilet: with modified independence;ambulating;bedside commode (over toilet) Pt Will Perform Toileting - Clothing Manipulation and hygiene: with modified independence;with adaptive equipment;sit to/from stand Additional ADL Goal #1: Pt will don/doff brace independently.  Additional ADL Goal #2: Pt will maintain back precautions throughout  ADL activity.  OT Frequency: Min 2X/week   Barriers to D/C: Decreased caregiver support  Wife unable to provide physical assist secondary to hx of back sx       Co-evaluation              End of Session Equipment Utilized During Treatment: Back brace  Activity Tolerance: Patient tolerated treatment well Patient left: in chair;with call bell/phone within reach;with chair alarm set;with family/visitor present   Time: [id number] OT Time Calculation (min): 19 min Charges:  OT General Charges $OT Visit: 1 Procedure OT Evaluation $OT Eval Moderate Complexity: 1 Procedure G-Codes:     Binnie Kand M.S., OTR/L Pager: [phone]  07/08/2015, 9:54 AM

## 2015-07-08 NOTE — Care Management Note (Signed)
Case Management Note  Patient Details  Name: Ghassan Coggeshall MRN: [id number] Date of Birth: [birth year]  Subjective/Objective:                    Action/Plan: Plan is for home health when patient is ready for discharge. CM met with the patient and provided him a list of agencies in the [facility] area. He selected [facility]. CM spoke to [facility] and they accepted the referral. Information they requested was faxed to them at the number they provided. Patient also ordered 3 in 1 and rolling walker. Patient states he has a walker at home and he does not want the 3 in 1. CM will continue to follow for discharge needs.   Expected Discharge Date:                  Expected Discharge Plan:  [place]  In-House Referral:     Discharge planning Services  CM Consult  Post Acute Care Choice:  Durable Medical Equipment, Home Health Choice offered to:  Patient  DME Arranged:  3-N-1, Walker rolling DME Agency:  [place]:  PT [place]:  [place]  Status of Service:  In process, will continue to follow  Medicare Important Message Given:    Date Medicare IM Given:    Medicare IM give by:    Date Additional Medicare IM Given:    Additional Medicare Important Message give by:     If discussed at [place] of Stay Meetings, dates discussed:    Additional Comments:  Pollie Friar, RN 07/08/2015, 2:34 PM

## 2015-07-08 NOTE — Progress Notes (Signed)
Pt arrived on unit 1940 hrs, A&O, no obvious distress, pain rated 5/10 but refused pain medication, pt oriented to room and equipment, transfer orders implemented.

## 2015-07-08 NOTE — Progress Notes (Signed)
Patient ID: Kyle Lewis, male   DOB: March 21, 1946, 70 y.o.   MRN: [id number] Doing great, walking straighter, no leg pain , taking no pain meds, good strength, already urinated. Maybe home today or tomorrow depending on how day progresses and how therapy goes

## 2015-07-09 LAB — GLUCOSE, CAPILLARY: Glucose-Capillary: 126 mg/dL — ABNORMAL HIGH (ref 65–99)

## 2015-07-09 MED ORDER — OXYCODONE HCL 5 MG PO TABS: 10.0000 mg | ORAL_TABLET | Freq: Four times a day (QID) | ORAL | Status: DC | PRN

## 2015-07-09 NOTE — Care Management Important Message (Signed)
Important Message  Patient Details  Name: Kyle Lewis MRN: [id number] Date of Birth: 17-Mar-1946   Medicare Important Message Given:  Yes    Barb Merino [name] 07/09/2015, 4:14 PM

## 2015-07-09 NOTE — Progress Notes (Signed)
Patient is being discharged home. Discharge instructions given and patient verbalized understanding.

## 2015-07-09 NOTE — Progress Notes (Signed)
Physical Therapy Treatment Patient Details Name: Kyle Lewis MRN: [id number] DOB: 07-Sep-1945 Today's Date: 07/09/2015    History of Present Illness s/p Decompressive lumbar laminectomy L2-3 L3-4 and L4-5, arthrodesis L2-L5  PMHx- obesity, Lt TKR, CVA, COPD    PT Comments    Patient mobilizing well. Knows his precautions and adhered to these while up and walking. Patient with no further questions for PT. Anticipated d/c home later today.  Follow Up Recommendations  Home health PT;Supervision for mobility/OOB     Equipment Recommendations  None recommended by PT    Recommendations for Other Services OT consult     Precautions / Restrictions Precautions Precautions: Back Precaution Comments: Patient able to recall and adhere to back precautions Required Braces or Orthoses: Spinal Brace Spinal Brace: Lumbar corset;Applied in sitting position    Mobility  Bed Mobility               General bed mobility comments: up in chair and returned to chair for lunch  Transfers Overall transfer level: Needs assistance Equipment used: Rolling walker (2 wheeled) Transfers: Sit to/from Stand Sit to Stand: Supervision         General transfer comment: vc for safe use of RW  Ambulation/Gait Ambulation/Gait assistance: Min assist;Supervision Ambulation Distance (Feet): 120 Feet Assistive device: Rolling walker (2 wheeled);None Gait Pattern/deviations: Step-through pattern;Decreased stride length;Trunk flexed   Gait velocity interpretation: Below normal speed for age/gender General Gait Details: attempted without RW to see if he would stand more upright, however pt felt unsteady and incr pain (grabbing for countertop, wall); vc for upright posture and proximity to [employer] Mobility    Modified Rankin (Stroke Patients Only)       Balance                                    Cognition Arousal/Alertness: Awake/alert Behavior  During Therapy: WFL for tasks assessed/performed;Impulsive Overall Cognitive Status: Within Functional Limits for tasks assessed                      Exercises      General Comments        Pertinent Vitals/Pain Pain Assessment: 0-10 Pain Score: 5  Pain Location: back Pain Descriptors / Indicators: Operative site guarding Pain Intervention(s): Limited activity within patient's tolerance;Monitored during session;Repositioned    Home Living                      Prior Function            PT Goals (current goals can now be found in the care plan section) Acute Rehab PT Goals Patient Stated Goal: regain upright posture Time For Goal Achievement: 07/10/15 Progress towards PT goals: Progressing toward goals (pt will have needed help on d/c; does not need to be mod I)    Frequency  Min 5X/week    PT Plan Current plan remains appropriate    Co-evaluation             End of Session Equipment Utilized During Treatment: Back brace;Gait belt Activity Tolerance: Patient tolerated treatment well Patient left: in chair;with call bell/phone within reach     Time: 1051-1103 PT Time Calculation (min) (ACUTE ONLY): 12 min  Charges:  $Gait Training: 8-22 mins  G Codes:      [id number] 07/09/2015, 2:18 PM Pager [phone]

## 2015-07-09 NOTE — Discharge Summary (Signed)
Physician Discharge Summary  Patient ID: Kyle Lewis MRN: [id number] DOB/AGE: [birth year] 70 y.o.  Admit date: 07/07/2015 Discharge date: 07/09/2015  Admission Diagnoses: lumbar stenosis with instability    Discharge Diagnoses: same   Discharged Condition: good  Hospital Course: The patient was admitted on 07/07/2015 and taken to the operating room where the patient underwent PLIF. The patient tolerated the procedure well and was taken to the recovery room and then to the floor in stable condition. The hospital course was routine. There were no complications. The wound remained clean dry and intact. Pt had appropriate back soreness. No complaints of leg pain or new N/T/W. The patient remained afebrile with stable vital signs, and tolerated a regular diet. The patient continued to increase activities, and pain was well controlled with oral pain medications.   Consults: None  Significant Diagnostic Studies:  Results for orders placed or performed during the hospital encounter of 07/07/15  CBC  Result Value Ref Range   WBC 10.6 (H) 4.0 - 10.5 K/uL   RBC 4.08 (L) 4.22 - 5.81 MIL/uL   Hemoglobin 12.9 (L) 13.0 - 17.0 g/dL   HCT 36.9 (L) 39.0 - 52.0 %   MCV 90.4 78.0 - 100.0 fL   MCH 31.6 26.0 - 34.0 pg   MCHC 35.0 30.0 - 36.0 g/dL   RDW 13.7 11.5 - 15.5 %   Platelets 214 150 - 400 K/uL  Glucose, capillary  Result Value Ref Range   Glucose-Capillary 181 (H) 65 - 99 mg/dL   Comment 1 Notify RN    Comment 2 Document in Chart   Glucose, capillary  Result Value Ref Range   Glucose-Capillary 123 (H) 65 - 99 mg/dL   Comment 1 Notify RN    Comment 2 Document in Chart   Glucose, capillary  Result Value Ref Range   Glucose-Capillary 118 (H) 65 - 99 mg/dL   Comment 1 Notify RN    Comment 2 Document in Chart   Glucose, capillary  Result Value Ref Range   Glucose-Capillary 138 (H) 65 - 99 mg/dL   Comment 1 Notify RN    Comment 2 Document in Chart   Glucose, capillary  Result Value Ref  Range   Glucose-Capillary 143 (H) 65 - 99 mg/dL   Comment 1 Notify RN    Comment 2 Document in Chart   Glucose, capillary  Result Value Ref Range   Glucose-Capillary 126 (H) 65 - 99 mg/dL    Chest 2 View  06/29/2015  CLINICAL DATA:  Preoperative exam. Patient is short of breath with activity. EXAM: CHEST  2 VIEW COMPARISON:  05/17/2015 FINDINGS: The cardiac silhouette is normal. Mediastinal contours appear intact. Tortuosity and calcific atherosclerotic disease of the aorta are noted. There is no evidence of focal airspace consolidation, pleural effusion or pneumothorax. Osseous structures are without acute abnormality. Bilateral shoulder joint arthropathy. Soft tissues are grossly normal. IMPRESSION: No active cardiopulmonary disease. Atherosclerotic disease of the aorta. Electronically Signed   By: Fidela Salisbury M.D.   On: 06/29/2015 13:03   Dg Lumbar Spine 2-3 Views  07/07/2015  CLINICAL DATA:  PLIF of lumbar spine. EXAM: DG C-ARM 61-120 MIN; LUMBAR SPINE - 2-3 VIEW COMPARISON:  None. FINDINGS: Three images from intraoperative C-arm radiography shows pedicle screws and posterior rods which extend from L2 through L5. Fusion cage is identified at the L4-5 disc space. IMPRESSION: 1. Status post posterior laminectomy and interbody fusion of L2 through L5. Electronically Signed   By: Queen Slough.D.  On: 07/07/2015 17:45   Dg C-arm 61-120 Min  07/07/2015  CLINICAL DATA:  PLIF of lumbar spine. EXAM: DG C-ARM 61-120 MIN; LUMBAR SPINE - 2-3 VIEW COMPARISON:  None. FINDINGS: Three images from intraoperative C-arm radiography shows pedicle screws and posterior rods which extend from L2 through L5. Fusion cage is identified at the L4-5 disc space. IMPRESSION: 1. Status post posterior laminectomy and interbody fusion of L2 through L5. Electronically Signed   By: Kerby Moors M.D.   On: 07/07/2015 17:45    Antibiotics:  Anti-infectives    Start     Dose/Rate Route Frequency Ordered Stop    07/07/15 2000  ceFAZolin (ANCEF) IVPB 1 g/50 mL premix     1 g 100 mL/hr over 30 Minutes Intravenous Every 8 hours 07/07/15 1958 07/08/15 0457   07/07/15 1708  vancomycin (VANCOCIN) powder  Status:  Discontinued       As needed 07/07/15 1709 07/07/15 1759   07/07/15 1422  vancomycin (VANCOCIN) 1000 MG powder    Comments:  Day, Dory   : cabinet override      07/07/15 1422 07/08/15 0229   07/07/15 1419  bacitracin 50,000 Units in sodium chloride irrigation 0.9 % 500 mL irrigation  Status:  Discontinued       As needed 07/07/15 1419 07/07/15 1759   07/07/15 0600  ceFAZolin (ANCEF) IVPB 2 g/50 mL premix     2 g 100 mL/hr over 30 Minutes Intravenous On call to O.R. 07/06/15 1348 07/07/15 1745      Discharge Exam: Blood pressure 124/64, pulse 75, temperature 97.9 F (36.6 C), temperature source Oral, resp. rate 20, height 5\' 7"  (1.702 m), weight 113.569 kg (250 lb 6 oz), SpO2 98 %. Neurologic: Grossly normal Dressing dry  Discharge Medications:     Medication List    TAKE these medications        aspirin 81 MG tablet  Take 81 mg by mouth daily. To start 06/30/15 after stop plavix     B-complex with vitamin C tablet  Take 1 tablet by mouth daily.     clopidogrel 75 MG tablet  Commonly known as:  PLAVIX  TAKE 1 TABLET BY MOUTH DAILY.     cyclobenzaprine 10 MG tablet  Commonly known as:  FLEXERIL  Take 10 mg by mouth 2 (two) times daily as needed for muscle spasms.     finasteride 5 MG tablet  Commonly known as:  PROSCAR  Take 5 mg by mouth daily.     losartan-hydrochlorothiazide 100-25 MG tablet  Commonly known as:  HYZAAR  Take 1 tablet by mouth every morning.     multivitamin with minerals Tabs tablet  Take 1 tablet by mouth daily.     omega-3 acid ethyl esters 1 g capsule  Commonly known as:  LOVAZA  Take 2 g by mouth 2 (two) times daily.     oxyCODONE 5 MG immediate release tablet  Commonly known as:  Oxy IR/ROXICODONE  Take 2 tablets (10 mg total) by mouth every  6 (six) hours as needed for severe pain.     tamsulosin 0.4 MG Caps capsule  Commonly known as:  FLOMAX  Take 0.4 mg by mouth daily after supper.     vitamin C 500 MG tablet  Commonly known as:  ASCORBIC ACID  Take 500 mg by mouth daily.     Vitamin D-3 5000 UNITS Tabs  Take 1 tablet by mouth daily.        Disposition: home  Final Dx: LL/ fusion L2-L5      Discharge Instructions    Call MD for:  difficulty breathing, headache or visual disturbances    Complete by:  As directed      Call MD for:  persistant nausea and vomiting    Complete by:  As directed      Call MD for:  redness, tenderness, or signs of infection (pain, swelling, redness, odor or green/yellow discharge around incision site)    Complete by:  As directed      Call MD for:  severe uncontrolled pain    Complete by:  As directed      Call MD for:  temperature >100.4    Complete by:  As directed      Diet - low sodium heart healthy    Complete by:  As directed      Discharge instructions    Complete by:  As directed   No driving, no bending or twisting, no heavy lifting, may shower     Increase activity slowly    Complete by:  As directed      Remove dressing in 48 hours    Complete by:  As directed            Follow-up Information    Follow up with JONES,DAVID S, MD In 2 weeks.   Specialty:  Neurosurgery   Contact information:   [address] [place] [address] [place] [address] [phone]        Signed: Eustace Moore 07/09/2015, 10:42 AM

## 2015-07-09 NOTE — Progress Notes (Signed)
Occupational Therapy Treatment Patient Details Name: Kendrick Ledyard MRN: [id number] DOB: 1946/04/20 Today's Date: 07/09/2015    History of present illness s/p Decompressive lumbar laminectomy L2-3 L3-4 and L4-5, arthrodesis L2-L5  PMHx- obesity, Lt TKR, CVA, COPD   OT comments  All education completed.  Pt is ready for discharge.   Follow Up Recommendations  No OT follow up;Supervision - Intermittent    Equipment Recommendations  None recommended by OT    Recommendations for Other Services      Precautions / Restrictions Precautions Precautions: Back Precaution Comments: Patient able to recall and adhere to back precautions Required Braces or Orthoses: Spinal Brace Spinal Brace: Lumbar corset;Applied in sitting position       Mobility Bed Mobility                  Transfers Overall transfer level: Needs assistance Equipment used: Rolling walker (2 wheeled) Transfers: Sit to/from [facility] Sit to Stand: Supervision Stand pivot transfers: Supervision            Balance Overall balance assessment: No apparent balance deficits (not formally assessed)                                 ADL                                         General ADL Comments: Reviewed back precautions and safety with pt.  Pt able to state 3/3 precautions and verbalize how to safely perform ADLs at discharge.  He reports wife will assist him as needed with LB ADLs, but he was able to verbalize safe technique.  Reviewed safety with grooming and recommendation to sit no longer than 45 mins at a time.  Discussed option of adding extra cushion to recliner to provide additional support and height, vs. sitting in straight back chair.       Vision                     Perception     Praxis      Cognition   Behavior During Therapy: [facility] for tasks assessed/performed;Impulsive Overall Cognitive Status: Within Functional Limits for tasks  assessed                       Extremity/Trunk Assessment               Exercises     Shoulder Instructions       General Comments      Pertinent Vitals/ Pain       Pain Assessment: Faces Faces Pain Scale: Hurts little more Pain Location: back  Pain Descriptors / Indicators: Aching Pain Intervention(s): Monitored during session  Home Living                                          Prior Functioning/Environment              Frequency Min 2X/week     Progress Toward Goals  OT Goals(current goals can now be found in the care plan section)  Progress towards OT goals: Progressing toward goals  ADL Goals Pt Will Perform Lower Body Dressing: with supervision;with adaptive equipment;sit to/from  stand Pt Will Transfer to Toilet: with modified independence;ambulating;bedside commode (over toilet) Pt Will Perform Toileting - Clothing Manipulation and hygiene: with modified independence;with adaptive equipment;sit to/from stand Additional ADL Goal #1: Pt will don/doff brace independently.  Additional ADL Goal #2: Pt will maintain back precautions throughout  ADL activity.  Plan Discharge plan remains appropriate    Co-evaluation                 End of Session Equipment Utilized During Treatment: Back brace   Activity Tolerance Patient tolerated treatment well   Patient Left in chair;with call bell/phone within reach   Nurse Communication Mobility status        Time: [id number] OT Time Calculation (min): 19 min  Charges: OT General Charges $OT Visit: 1 Procedure OT Treatments $Self Care/Home Management : 8-22 mins  Melissia Lahman M 07/09/2015, 8:03 PM

## 2015-07-13 MED FILL — Heparin Sodium (Porcine) Inj 1000 Unit/ML: INTRAMUSCULAR | Qty: 30 | Status: AC

## 2015-07-13 MED FILL — Sodium Chloride IV Soln 0.9%: INTRAVENOUS | Qty: 3000 | Status: AC

## 2015-07-22 ENCOUNTER — Ambulatory Visit: Payer: Medicare Other | Admitting: Nurse Practitioner

## 2015-09-01 ENCOUNTER — Telehealth: Payer: Self-pay

## 2015-09-01 ENCOUNTER — Other Ambulatory Visit: Payer: Self-pay | Admitting: Neurology

## 2015-09-01 NOTE — Telephone Encounter (Signed)
Rn call patients wife about her husband r/s for a follow up.Pt was last seen 2016 and had to cancel appt for 07/2015 because of surgery. Rn stated a refill can be given one time until he comes in. Pt schedule with Nancy(NP) for April 2017.

## 2015-09-01 NOTE — Telephone Encounter (Signed)
Pt said he had missed a call from [place]. Msg relayed to pt, he understood.

## 2015-09-09 ENCOUNTER — Encounter: Payer: Self-pay | Admitting: Nurse Practitioner

## 2015-09-09 ENCOUNTER — Ambulatory Visit: Payer: Self-pay | Admitting: Nurse Practitioner

## 2015-09-09 ENCOUNTER — Ambulatory Visit (INDEPENDENT_AMBULATORY_CARE_PROVIDER_SITE_OTHER): Payer: Medicare Other | Admitting: Nurse Practitioner

## 2015-09-09 VITALS — BP 123/76 | HR 72 | Wt 258.8 lb

## 2015-09-09 DIAGNOSIS — I6521 Occlusion and stenosis of right carotid artery: Secondary | ICD-10-CM

## 2015-09-09 DIAGNOSIS — G4730 Sleep apnea, unspecified: Secondary | ICD-10-CM | POA: Diagnosis not present

## 2015-09-09 DIAGNOSIS — Z8673 Personal history of transient ischemic attack (TIA), and cerebral infarction without residual deficits: Secondary | ICD-10-CM

## 2015-09-09 DIAGNOSIS — I10 Essential (primary) hypertension: Secondary | ICD-10-CM | POA: Diagnosis not present

## 2015-09-09 DIAGNOSIS — I739 Peripheral vascular disease, unspecified: Secondary | ICD-10-CM

## 2015-09-09 DIAGNOSIS — I779 Disorder of arteries and arterioles, unspecified: Secondary | ICD-10-CM | POA: Diagnosis not present

## 2015-09-09 MED ORDER — CLOPIDOGREL BISULFATE 75 MG PO TABS: 75.0000 mg | ORAL_TABLET | Freq: Every day | ORAL | Status: DC

## 2015-09-09 NOTE — Progress Notes (Signed)
[facility]  PATIENT: Kyle Lewis DOB: 1946-04-14   REASON FOR VISIT:  History of right MCA stroke with risk factors of obesity obstructive sleep apnea, smoking, carotid artery disease HISTORY FROM: patient    HISTORY OF PRESENT ILLNESS:Kyle Lewis, 70 year old male returns for followup. He was last seen in this office 07/21/14. 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 He has been on Plavix since that time for secondary stroke prevention without further stroke or TIA symptoms. His blood pressure has been in good control and his lipids are followed by his primary care Dr. Asher Muir in [facility] His most recent carotid Doppler was done 06/10/15 by Dr. Sabino Snipes was evidence of 40-60% left internal carotid artery stenosis. 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 This has been unchanged for 4 years  He does little exercise. He has minimal bruising from his Plavix. He also has obstructive sleep apnea and is compliant with his CPAP. He continues to smoke about a pack of cigarettes a day. He has no new neurologic complaints. He needs refills on his Plavix    HISTORY developed sudden onset of left upper limb and hand numbness one week prior to presentation. He was initially seen at [facility] and diagnosed with a TIA. He had outpatient workup arrange an MRI scan subsequently showed a right frontal MCA branch infarct. CT angiogram showed complete occlusion of the right internal carotid artery at its origin. There was some reconstitution in the ophthalmic segment. Left vertebral artery also had proximal high-grade stenosis. The patient was started on Plavix for secondary stroke prevention 2-D echocardiogram showed normal ejection fraction. Patient states is done well since discharge and he has only minimum  tingling in his left upper lip and fingertips with some diminished fine motor skills. He has otherwise made good recovery and has no significant physical or cognitive limitations. He wants to go back to driving. 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 Is tolerating Plavix without bleeding, bruising or other side effects. He states his blood pressure is under good control. He does have sleep apnea and uses his CPAP every night. He claimed to have quit smoking when asked him but while filling out the paperwork he stated that he still smokes half a pack per day. He was not aware of any known diagnosis of right carotid stenosis. He denies any significant injury to his neck or neck pain to suggest possible carotid dissection as etiology of his carotid occlusion.    REVIEW OF SYSTEMS: Full 14 system review of systems performed and notable only for those listed, all others are neg:  Constitutional: neg  Cardiovascular: neg Ear/Nose/Throat: neg  Skin: neg Eyes: neg Respiratory: neg Gastroitestinal:  Urinary frequency Hematology/Lymphatic: neg  Endocrine: neg Musculoskeletal: recent back surgery Allergy/Immunology: neg Neurological: neg Psychiatric: neg Sleep :  Obstructive sleep apnea with CPAP   ALLERGIES: No Known Allergies  HOME MEDICATIONS: Outpatient Prescriptions Prior to Visit  Medication Sig Dispense Refill  . B Complex-C (B-COMPLEX WITH VITAMIN C) tablet Take 1 tablet by mouth daily.    . Cholecalciferol (VITAMIN D-3) 5000 UNITS TABS Take 1 tablet by mouth daily.    . cyclobenzaprine (FLEXERIL) 10 MG tablet Take 10 mg by mouth 2 (two) times daily as needed for muscle spasms.    . finasteride (PROSCAR) 5 MG  tablet Take 5 mg by mouth daily.    Marland Kitchen losartan-hydrochlorothiazide (HYZAAR) 100-25 MG per tablet Take 1 tablet by mouth every morning.    . Multiple Vitamin (MULTIVITAMIN WITH MINERALS) TABS Take 1 tablet by mouth daily.    Marland Kitchen omega-3 acid ethyl  esters (LOVAZA) 1 G capsule Take 2 g by mouth 2 (two) times daily.    . Tamsulosin HCl (FLOMAX) 0.4 MG CAPS Take 0.4 mg by mouth daily after supper.     . vitamin C (ASCORBIC ACID) 500 MG tablet Take 500 mg by mouth daily.    . clopidogrel (PLAVIX) 75 MG tablet TAKE 1 TABLET BY MOUTH DAILY. 90 tablet 0  . aspirin 81 MG tablet Take 81 mg by mouth daily. Reported on 09/09/2015    . oxyCODONE (OXY IR/ROXICODONE) 5 MG immediate release tablet Take 2 tablets (10 mg total) by mouth every 6 (six) hours as needed for severe pain. 90 tablet 0   No facility-administered medications prior to visit.    PAST MEDICAL HISTORY: Past Medical History  Diagnosis Date  . Diverticulosis   . Adenomatous colon polyp   . Sleep apnea     wears CPAP  . Allergy     seasonal  . Arthritis   . Cataract   . Hypertension   . Nocturia   . BPH (benign prostatic hyperplasia)   . Adrenal mass ([place])     2 benign appearing left adrenal adenomas noted on 01/13/15 CT  . Chronic back pain   . CVA (cerebral infarction)   . COPD (chronic obstructive pulmonary disease) ([place])   . Peripheral vascular disease ([place])   . Carotid artery occlusion   . Stroke [facility]) 2013    tia   . 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 Aortic stenosis     mild AS by 04/2012 echo    PAST SURGICAL HISTORY: Past Surgical History  Procedure Laterality Date  . Umbilical hernia repair  2011  . Tonsillectomy    . Appendectomy    . Carpal tunnel release      left  . Cataracts      both eyes  . Inguinal hernia repair    . Transurethral resection of prostate    . Total knee arthroplasty  04/01/2012    Procedure: TOTAL KNEE ARTHROPLASTY;  Surgeon: Gearlean Alf, MD;  Location: [facility];  Service: Orthopedics;  Laterality: Left;  . Joint replacement Left 04-01-12    Knee  . Eye surgery Bilateral     Cataract  . Prostate surgery    . Brain meningioma excision      menigioma  . Maximum access  (mas)posterior lumbar interbody fusion (plif) 1 level N/A 07/07/2015    Procedure:  POSTERIOR LUMBAR INTERBODY FUSION (PLIF) Lumbar Four-Five with Pedicle Screw Fixation Lumbar Two-Five;Laminectomy Lumbar Two-Five;  Surgeon: Eustace Moore, MD;  Location: [facility];  Service: Neurosurgery;  Laterality: N/A;   POSTERIOR LUMBAR INTERBODY FUSION (PLIF) Lumbar Four-Five with Pedicle Screw Fixation Lumbar Two-Five;Laminectomy Lumbar Two-Five    FAMILY HISTORY: Family History  Problem Relation Age of Onset  . Prostate cancer Maternal Grandfather   . Colon cancer Neg Hx   . Esophageal cancer Neg Hx   . Rectal cancer Neg Hx   . Stomach cancer Neg Hx   . Heart disease Mother     After 27 yrs of age  . Hypertension Mother   . Heart  attack Mother   . Hyperlipidemia Mother   . Diabetes Father   . Heart attack Father   . Heart disease Father     NOT  before age 22- [facility]    SOCIAL HISTORY: Social History   Social History  . Marital Status: Married    Spouse Name: [name]   . Number of Children: 2  . Years of Education: College    Occupational History  .      Self employed Administrator    Social History Main Topics  . Smoking status: Light Tobacco Smoker -- 0.50 packs/day for 20 years    Types: Cigarettes  . Smokeless tobacco: Never Used     Comment: pt states he is trying to quit, 09/09/15 1/2 PPD  . Alcohol Use: Yes     Comment: rare  . Drug Use: No  . Sexual Activity: Not on file   Other Topics Concern  . Not on file   Social History Narrative   Patient lives at home with his wife. Rhonda    Patient has a Financial risk analyst.    Patient has 2 sons.    Patient smokes a half pack a day.      PHYSICAL EXAM  Filed Vitals:   09/09/15 0942 09/09/15 0947  BP: 123/76 123/76  Pulse: 72 72  Weight: 259 lb 12.8 oz (117.845 kg) 258 lb 12.8 oz (117.391 kg)   Body mass index is 40.52 kg/(m^2).   Generalized: Well developed, morbidly obese male in no acute distress  Head:  normocephalic and atraumatic,. Oropharynx benign  Neck: Supple, no carotid bruits  Cardiac: Regular rate rhythm,  No murmur Musculoskeletal: No deformity   Neurological examination   Mentation: Alert oriented to time, place, history taking. Follows all commands speech and language fluent  Cranial nerve II-XII: Pupils were equal round reactive to light extraocular movements were full, visual field were full on confrontational test. Facial sensation and strength were normal. hearing was intact to finger rubbing bilaterally. Uvula tongue midline. head turning and shoulder shrug were normal and symmetric.Tongue protrusion into cheek strength was normal. Motor: normal bulk and tone, full strength in the BUE, BLE, fine finger movements normal, no pronator drift. No focal weakness Sensory: normal and symmetric to light touch,  on the face arms and legs Coordination: finger-nose-finger, heel-to-shin bilaterally, no dysmetria Reflexes: Brachioradialis 2/2, biceps 2/2, triceps 2/2, patellar 2/2, Achilles 2/2, plantar responses were flexor bilaterally. Gait and Station: Rising up from seated position without assistance, normal stance, moderate stride, good arm swing, smooth turning, able to perform tiptoe, and heel walking without difficulty. Tandem gait is mildly unsteady.  Ambulates with a single-point cane DIAGNOSTIC DATA (LABS, IMAGING, TESTING) - I reviewed patient records, labs, notes, testing and imaging myself where available.  Lab Results  Component Value Date   WBC 10.6* 07/08/2015   HGB 12.9* 07/08/2015   HCT 36.9* 07/08/2015   MCV 90.4 07/08/2015   PLT 214 07/08/2015      Component Value Date/Time   NA 137 06/29/2015 1200   K 4.4 06/29/2015 1200   CL 102 06/29/2015 1200   CO2 27 06/29/2015 1200   GLUCOSE 121* 06/29/2015 1200   BUN 12 06/29/2015 1200   CREATININE 0.62 06/29/2015 1200   CALCIUM 9.5 06/29/2015 1200   PROT 6.7 05/03/2012 1613   ALBUMIN 3.5 05/03/2012 1613    AST 16 05/03/2012 1613   ALT 14 05/03/2012 1613   ALKPHOS 108 05/03/2012 1613   BILITOT 0.4 05/03/2012 1613  GFRNONAA >60 06/29/2015 1200   GFRAA >60 06/29/2015 1200     ASSESSMENT AND PLAN 70 y.o. year old male has a past medical history of right frontal embolic infarct secondary to right internal carotid artery occlusion in November 2013. Multiple risk factors of smoking, obesity, hypertension, and obstructive sleep apnea. He has not had further stroke or TIA symptoms. He is currently on Plavix with minimal bruising.Carotid Doppler was done 06/10/15 by Dr. Sabino Snipes was evidence of 40-60% left internal carotid artery stenosis. 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 This has been unchanged for 4 years The patient is a current patient of Dr. Leonie Man who is out of the office today . This note is sent to the work in doctor.   PLAN: Continue CPAP as ordered every night Keep systolic blood pressure less than 130, today's reading 123/76 Lipids are followed by Dr. Asher Muir  Carotid Doppler to be followed by Dr. Oneida Alar, just done in January 2017 Continue Plavix for secondary stroke prevention will refill for 1 year then obtain from PCP Stop smoking and given information on smoking cessation No further stroke or TIA symptoms since November 2013 If recurrent stroke symptoms occur, call 911 and proceed to the hospital Discharge from neurologic services at this time Dennie Bible, [facility], [facility], [facility] [address], [place], [place] [address] [phone]

## 2015-09-09 NOTE — Patient Instructions (Signed)
Continue CPAP as ordered every night Keep systolic blood pressure less than 130, today's reading 123/76 Lipids are followed by Dr. Asher Muir  Carotid Doppler to be followed by Dr. Oneida Alar, just done in January 2017 Continue Plavix for secondary stroke prevention will refill for 1 year then obtain from PCP No further stroke or TIA symptoms since November 2013 If recurrent stroke symptoms occur, call 911 and proceed to the hospital Discharge from neurologic services at this time

## 2015-09-09 NOTE — Progress Notes (Signed)
Agree with impression and plan described above by Cecille Rubin, NP

## 2015-12-23 ENCOUNTER — Inpatient Hospital Stay: Admission: RE | Admit: 2015-12-23 | Payer: Medicare Other | Source: Ambulatory Visit

## 2015-12-23 ENCOUNTER — Encounter: Payer: Self-pay | Admitting: Vascular Surgery

## 2015-12-23 ENCOUNTER — Ambulatory Visit
Admission: RE | Admit: 2015-12-23 | Discharge: 2015-12-23 | Disposition: A | Discharge status: Home / Self Care | Payer: Medicare Other | Source: Ambulatory Visit | Attending: Vascular Surgery | Admitting: Vascular Surgery

## 2015-12-23 DIAGNOSIS — I714 Abdominal aortic aneurysm, without rupture, unspecified: Secondary | ICD-10-CM

## 2015-12-23 DIAGNOSIS — I6521 Occlusion and stenosis of right carotid artery: Secondary | ICD-10-CM

## 2015-12-23 MED ORDER — IOPAMIDOL (ISOVUE-370) INJECTION 76%
100.0000 mL | Freq: Once | INTRAVENOUS | Status: AC | PRN
  Administered 2015-12-23: 100 mL via INTRAVENOUS

## 2015-12-30 ENCOUNTER — Encounter: Payer: Self-pay | Admitting: Vascular Surgery

## 2015-12-30 ENCOUNTER — Ambulatory Visit (INDEPENDENT_AMBULATORY_CARE_PROVIDER_SITE_OTHER): Payer: Medicare Other | Admitting: Vascular Surgery

## 2015-12-30 VITALS — BP 112/72 | HR 73 | Temp 97.3°F | Ht 67.0 in | Wt 253.0 lb

## 2015-12-30 DIAGNOSIS — I714 Abdominal aortic aneurysm, without rupture, unspecified: Secondary | ICD-10-CM

## 2015-12-30 DIAGNOSIS — I723 Aneurysm of iliac artery: Secondary | ICD-10-CM | POA: Diagnosis not present

## 2015-12-30 DIAGNOSIS — I6529 Occlusion and stenosis of unspecified carotid artery: Secondary | ICD-10-CM | POA: Diagnosis not present

## 2015-12-30 NOTE — Progress Notes (Signed)
History of Present Illness:  Patient is a 70 y.o. year old male who presents for evaluation of carotid stenosis as well asabdominal aortic aneurysm.   The patient had a stroke approximate 4 years ago. He was noted to have occlusion of his right internal carotid artery at that time. At that time the left internal carotid artery also had some stenosis. He had some tingling in his left fingertips and left lip at that time. This has improved somewhat. The patient denies any  recent symptoms of TIA, amaurosis, or stroke.  The patient is currently on Plavix antiplatelet therapy.  Other medical problems include sleep apnea requiring CPAP, hypertension, COPD.  These are currently stable. He also has a history of tobacco abuse. He currently smokes about one pack of cigarettes per day. Greater than 3 minutes they were spent regarding smoking cessation counseling.    He was recently found to have a 4.8 cm abdominal aortic aneurysm on MRI scan for back pain. He presents today for further evaluation of his aneurysm. He is scheduled to have a repeat carotid ultrasound in January 2017.        Past Medical History   Diagnosis  Date   .  Diverticulosis     .  Adenomatous colon polyp     .  Sleep apnea         wears CPAP   .  Allergy         seasonal   .  Arthritis     .  Cataract     .  Hypertension     .  Nocturia     .  BPH (benign prostatic hyperplasia)     .  Adrenal mass     .  Chronic back pain     .  CVA (cerebral infarction)     .  COPD (chronic obstructive pulmonary disease)     .  Peripheral vascular disease     .  Stroke              Past Surgical History   Procedure  Date   .  Umbilical hernia repair     .  Tonsillectomy     .  Appendectomy     .  Brain meningioma excision     .  Carpal tunnel release         left   .  Cataracts         both eyes   .  Inguinal hernia repair     .  Transurethral resection of prostate     .  Total knee arthroplasty  04/01/2012       Procedure: TOTAL  KNEE ARTHROPLASTY;  Surgeon: Gearlean Alf, MD;  Location: [facility];  Service: Orthopedics;  Laterality: Left;         Social History       History   Substance Use Topics   .  Smoking status:  Current Every Day Smoker -- 0.5 packs/day for 20 years       Types:  Cigarettes   .  Smokeless tobacco:  Never Used         Comment: pt states he is trying to quit   .  Alcohol Use:  Yes         Comment: rare       Family History      Family History   Problem  Relation  Age of Onset   .  Prostate cancer  Maternal Grandfather     .  Colon cancer  Neg Hx     .  Esophageal cancer  Neg Hx     .  Rectal cancer  Neg Hx     .  Stomach cancer  Neg Hx     .  Heart disease  Mother     .  Hypertension  Mother     .  Diabetes  Father     .  Heart attack  Father         Allergies   No Known Allergies           Current Outpatient Prescriptions on File Prior to Visit  Medication Sig Dispense Refill  . B Complex-C (B-COMPLEX WITH VITAMIN C) tablet Take 1 tablet by mouth daily.      . Cholecalciferol (VITAMIN D-3) 5000 UNITS TABS Take 1 tablet by mouth daily.      . clopidogrel (PLAVIX) 75 MG tablet TAKE 1 TABLET BY MOUTH DAILY. 90 tablet 0  . finasteride (PROSCAR) 5 MG tablet Take 5 mg by mouth daily.      Marland Kitchen losartan-hydrochlorothiazide (HYZAAR) 100-25 MG per tablet Take 1 tablet by mouth every morning.      . mirabegron ER (MYRBETRIQ) 50 MG TB24 tablet Take 50 mg by mouth daily.      . Multiple Vitamin (MULTIVITAMIN WITH MINERALS) TABS Take 1 tablet by mouth daily.      Marland Kitchen omega-3 acid ethyl esters (LOVAZA) 1 G capsule Take 2 g by mouth 2 (two) times daily.      Marland Kitchen oxyCODONE (OXY IR/ROXICODONE) 5 MG immediate release tablet Take 10 mg by mouth as needed. Once per night      . Tamsulosin HCl (FLOMAX) 0.4 MG CAPS Take 0.4 mg by mouth daily after supper.       . vitamin C (ASCORBIC ACID) 500 MG tablet Take 500 mg by mouth daily.        No current facility-administered medications on file prior to  visit.      ROS:     General:  No weight loss, Fever, chills   HEENT: No recent headaches, no nasal bleeding, no visual changes, no sore throat   Neurologic: No dizziness, blackouts, seizures. No recent symptoms of stroke or mini- stroke. No recent episodes of slurred speech, or temporary blindness.   Cardiac: No recent episodes of chest pain/pressure, + shortness of breath at rest.  No shortness of breath with exertion.  Denies history of atrial fibrillation or irregular heartbeat   Vascular: No history of rest pain in feet.  No history of claudication.  No history of non-healing ulcer, No history of DVT     Pulmonary: No home oxygen, no productive cough, no hemoptysis,  No asthma or wheezing   Musculoskeletal:  [ ]  Arthritis, [x ] Low back pain,  [ ]  Joint pain   Hematologic:No history of hypercoagulable state.  No history of easy bleeding.  No history of anemia   Gastrointestinal: No hematochezia or melena,  No gastroesophageal reflux, no trouble swallowing   Urinary: [ ]  chronic Kidney disease, [ ]  on HD - [ ]  MWF or [ ]  TTHS, [ ]  Burning with urination, [ ]  Frequent urination, [ ]  Difficulty urinating;     Skin: No rashes   Psychological: No history of anxiety,  No history of depression     Physical Examination    Vitals:   12/30/15 1125 12/30/15 1127  BP: 115/73 112/72  Pulse:  73   Temp: 97.3 F (36.3 C)   TempSrc: Oral   SpO2: 94%   Weight: 253 lb (114.8 kg)   Height: 5\' 7"  (1.702 m)      General:  Alert and oriented, no acute distress HEENT: Normal Neck: No bruit or JVD Pulmonary: Clear to auscultation bilaterally Cardiac: Regular Rate and Rhythm without murmur Gastrointestinal: Soft, non-tender, non-distended, no mass, periumbilical scar,  obese Skin: No rash Extremity Pulses:  2+ radial, brachial, femoral, absent dorsalis pedis, posterior tibial pulses bilaterally Musculoskeletal: No deformity or edema     Neurologic: Upper and lower extremity motor 5/5  and symmetric, no facial droop   DATA:   Recent CT angiogram shows his aneurysm has increased in size from 5-5.3 cm comparing to a CT from one year ago. 3 cm left common iliac aneurysm.     ASSESSMENT: Asymptomatic 40-60% left internal carotid artery stenosis with right internal carotid artery occlusion.   Continue risk factor modification and again emphasized smoking cessation.  Follow-up carotid duplex in January 2017  Abdominal aortic aneurysm appears to be amenable to stent graft repair if we proceed with this. I offered the patient repair of his abdominal aortic aneurysm today since it has grown 3 mm and is almost at the level of 5.5 cm. I discussed with the patient that I think is inevitable that the aneurysm is going to grow this level. However he prefers to defer repair at this time wishing to wait until it is 5-1/2 cm or more. The left iliac aneurysm is also going to need to be repaired which is currently 3 cm. The patient will follow-up with me in 6 months time with repeat CT scan at that time. If he has continued progression of growth we will again discuss whether or not he would wish to go with repair. Discussed with the patient today if he has severe abdominal or back pain he should let anyone in the emergency room no that he has no abdominal aortic aneurysm.   PLAN:   see above   Ruta Hinds, MD [facility] Office: [phone] Pager: [phone]

## 2016-04-20 ENCOUNTER — Other Ambulatory Visit: Payer: Self-pay | Admitting: *Deleted

## 2016-04-20 DIAGNOSIS — I714 Abdominal aortic aneurysm, without rupture, unspecified: Secondary | ICD-10-CM

## 2016-06-05 HISTORY — PX: BACK SURGERY: SHX140

## 2016-06-15 ENCOUNTER — Ambulatory Visit: Payer: Medicare Other | Admitting: Family

## 2016-06-15 ENCOUNTER — Ambulatory Visit (HOSPITAL_COMMUNITY): Payer: Medicare Other

## 2016-07-06 ENCOUNTER — Ambulatory Visit
Admission: RE | Admit: 2016-07-06 | Discharge: 2016-07-06 | Disposition: A | Discharge status: Home / Self Care | Payer: Medicare Other | Source: Ambulatory Visit | Attending: Vascular Surgery | Admitting: Vascular Surgery

## 2016-07-06 ENCOUNTER — Ambulatory Visit: Payer: Medicare Other | Admitting: Vascular Surgery

## 2016-07-06 ENCOUNTER — Other Ambulatory Visit: Payer: Medicare Other

## 2016-07-06 DIAGNOSIS — I714 Abdominal aortic aneurysm, without rupture, unspecified: Secondary | ICD-10-CM

## 2016-07-06 MED ORDER — IOPAMIDOL (ISOVUE-370) INJECTION 76%
100.0000 mL | Freq: Once | INTRAVENOUS | Status: DC | PRN
Start: 2016-07-06 — End: 2016-07-07

## 2016-07-18 ENCOUNTER — Encounter: Payer: Self-pay | Admitting: Vascular Surgery

## 2016-07-27 ENCOUNTER — Ambulatory Visit (INDEPENDENT_AMBULATORY_CARE_PROVIDER_SITE_OTHER): Payer: Medicare Other | Admitting: Vascular Surgery

## 2016-07-27 ENCOUNTER — Telehealth: Payer: Self-pay | Admitting: Vascular Surgery

## 2016-07-27 ENCOUNTER — Encounter: Payer: Self-pay | Admitting: Vascular Surgery

## 2016-07-27 ENCOUNTER — Ambulatory Visit (HOSPITAL_COMMUNITY)
Admission: RE | Admit: 2016-07-27 | Discharge: 2016-07-27 | Disposition: A | Discharge status: Home / Self Care | Payer: Medicare Other | Source: Ambulatory Visit | Attending: Vascular Surgery | Admitting: Vascular Surgery

## 2016-07-27 VITALS — BP 137/86 | HR 74 | Temp 97.2°F | Resp 20 | Ht 67.0 in | Wt 241.0 lb

## 2016-07-27 DIAGNOSIS — I714 Abdominal aortic aneurysm, without rupture, unspecified: Secondary | ICD-10-CM

## 2016-07-27 DIAGNOSIS — I6523 Occlusion and stenosis of bilateral carotid arteries: Secondary | ICD-10-CM | POA: Diagnosis not present

## 2016-07-27 DIAGNOSIS — I6521 Occlusion and stenosis of right carotid artery: Secondary | ICD-10-CM

## 2016-07-27 LAB — VAS US CAROTID
LEFT ECA DIAS: -10 cm/s
LEFT VERTEBRAL DIAS: 17 cm/s
Left CCA dist dias: -13 cm/s
Left CCA dist sys: -39 cm/s
Left CCA prox dias: 19 cm/s
Left CCA prox sys: 90 cm/s
Left ICA prox dias: 16 cm/s
Left ICA prox sys: 46 cm/s
RIGHT CCA MID DIAS: 5 cm/s
RIGHT ECA DIAS: -12 cm/s
RIGHT VERTEBRAL DIAS: -42 cm/s
Right CCA prox dias: 9 cm/s
Right CCA prox sys: 71 cm/s

## 2016-07-27 NOTE — Telephone Encounter (Signed)
Notified [facility] of referral for Pre-op clearance.

## 2016-07-27 NOTE — Telephone Encounter (Signed)
Left a message for Dr. Ledell Peoples office regarding referral for forehead lesion. Will follow up.

## 2016-07-27 NOTE — Progress Notes (Addendum)
History of Present Illness:  Patient is a 71 y.o. year old male who presents for evaluation of carotid stenosis as well as abdominal aortic aneurysm.   The patient had a stroke approximate 4 years ago. He was noted to have occlusion of his right internal carotid artery at that time. At that time the left internal carotid artery also had some stenosis. He had some tingling in his left fingertips and left lip at that time. This has improved somewhat. The patient denies any  recent symptoms of TIA, amaurosis, or stroke.  The patient is currently on Plavix antiplatelet therapy.  Other medical problems include sleep apnea requiring CPAP, hypertension, COPD.  These are currently stable. He also has a history of tobacco abuse. He currently smokes about one pack of cigarettes per day. Greater than 3 minutes they were spent regarding smoking cessation counseling.    He was recently found to have a 4.8 cm abdominal aortic aneurysm on MRI scan for back pain. He presents today for further evaluation of his aneurysm. He is scheduled to have a repeat carotid ultrasound in January 2017.    Past Medical History:  Diagnosis Date  . 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 Adenomatous colon polyp   . Adrenal mass ([place])    2 benign appearing left adrenal adenomas noted on 01/13/15 CT  . Allergy    seasonal  . Aortic stenosis    mild AS by 04/2012 echo  . Arthritis   . BPH (benign prostatic hyperplasia)   . Carotid artery occlusion   . Cataract   . Chronic back pain   . COPD (chronic obstructive pulmonary disease) ([place])   . CVA (cerebral infarction)   . Diverticulosis   . Hypertension   . Nocturia   . Peripheral vascular disease ([place])   . Sleep apnea    wears CPAP  . Stroke [facility]) 2013   tia    Past Surgical History:  Procedure Laterality Date  . APPENDECTOMY    . BRAIN MENINGIOMA EXCISION     menigioma  . CARPAL TUNNEL RELEASE     left  . cataracts     both eyes  . EYE SURGERY Bilateral    Cataract  . INGUINAL HERNIA REPAIR    . JOINT REPLACEMENT Left 04-01-12   Knee  . MAXIMUM ACCESS (MAS)POSTERIOR LUMBAR INTERBODY FUSION (PLIF) 1 LEVEL N/A 07/07/2015   Procedure:  POSTERIOR LUMBAR INTERBODY FUSION (PLIF) Lumbar Four-Five with Pedicle Screw Fixation Lumbar Two-Five;Laminectomy Lumbar Two-Five;  Surgeon: Eustace Moore, MD;  Location: [place] [facility];  Service: Neurosurgery;  Laterality: N/A;   POSTERIOR LUMBAR INTERBODY FUSION (PLIF) Lumbar Four-Five with Pedicle Screw Fixation Lumbar Two-Five;Laminectomy Lumbar Two-Five  . PROSTATE SURGERY    . TONSILLECTOMY    . TOTAL KNEE ARTHROPLASTY  04/01/2012   Procedure: TOTAL KNEE ARTHROPLASTY;  Surgeon: Gearlean Alf, MD;  Location: [facility];  Service: Orthopedics;  Laterality: Left;  . TRANSURETHRAL RESECTION OF PROSTATE    . UMBILICAL HERNIA REPAIR  2011       Social History            History   Substance Use Topics   .  Smoking status:  Current Every Day Smoker -- 0.5 packs/day for 20 years       Types:  Cigarettes   .  Smokeless tobacco:  Never Used         Comment: pt states he  is trying to quit   .  Alcohol Use:  Yes         Comment: rare       Family History          Family History   Problem  Relation  Age of Onset   .  Prostate cancer  Maternal Grandfather     .  Colon cancer  Neg Hx     .  Esophageal cancer  Neg Hx     .  Rectal cancer  Neg Hx     .  Stomach cancer  Neg Hx     .  Heart disease  Mother     .  Hypertension  Mother     .  Diabetes  Father     .  Heart attack  Father         Allergies   No Known Allergies      Current Outpatient Prescriptions on File Prior to Visit  Medication Sig Dispense Refill  . B Complex-C (B-COMPLEX WITH VITAMIN C) tablet Take 1 tablet by mouth daily.    . Cholecalciferol (VITAMIN D-3) 5000 UNITS TABS Take 1 tablet by mouth daily.    . clopidogrel (PLAVIX) 75 MG tablet Take 1 tablet (75 mg total) by mouth daily. 90 tablet 3  .  cyclobenzaprine (FLEXERIL) 10 MG tablet Take 10 mg by mouth 2 (two) times daily as needed for muscle spasms.    . finasteride (PROSCAR) 5 MG tablet Take 5 mg by mouth daily.    Marland Kitchen losartan-hydrochlorothiazide (HYZAAR) 100-25 MG per tablet Take 1 tablet by mouth every morning.    . Multiple Vitamin (MULTIVITAMIN WITH MINERALS) TABS Take 1 tablet by mouth daily.    . Oxycodone HCl 10 MG TABS 1 (ONE) TABLET BY MOUTH FOUR TIMES DAILY, AS NEEDED  0  . Tamsulosin HCl (FLOMAX) 0.4 MG CAPS Take 0.4 mg by mouth daily after supper.     Marland Kitchen tiZANidine (ZANAFLEX) 4 MG tablet As needed  0  . omega-3 acid ethyl esters (LOVAZA) 1 G capsule Take 2 g by mouth 2 (two) times daily.    . vitamin C (ASCORBIC ACID) 500 MG tablet Take 500 mg by mouth daily.     No current facility-administered medications on file prior to visit.      ROS:     General:  No weight loss, Fever, chills   HEENT: No recent headaches, no nasal bleeding, no visual changes, no sore throat   Neurologic: No dizziness, blackouts, seizures. No recent symptoms of stroke or mini- stroke. No recent episodes of slurred speech, or temporary blindness.   Cardiac: No recent episodes of chest pain/pressure, + shortness of breath at rest.  No shortness of breath with exertion.  Denies history of atrial fibrillation or irregular heartbeat   Vascular: No history of rest pain in feet.  No history of claudication.  No history of non-healing ulcer, No history of DVT     Pulmonary: No home oxygen, no productive cough, no hemoptysis,  No asthma or wheezing   Musculoskeletal:  [ ]  Arthritis, [x ] Low back pain,  [ ]  Joint pain   Hematologic:No history of hypercoagulable state.  No history of easy bleeding.  No history of anemia   Gastrointestinal: No hematochezia or melena,  No gastroesophageal reflux, no trouble swallowing   Urinary: [ ]  chronic Kidney disease, [ ]  on HD - [ ]  MWF or [ ]  TTHS, [ ]  Burning with urination, [ ]   Frequent urination, [ ]   Difficulty urinating;     Skin: No rashes   Psychological: No history of anxiety,  No history of depression     Physical Examination      Vitals:   07/27/16 0906 07/27/16 0908  BP: (!) 141/84 137/86  Pulse: 74   Resp: 20   Temp: 97.2 F (36.2 C)   TempSrc: Oral   SpO2: 96%   Weight: 241 lb (109.3 kg)   Height: 5\' 7"  (1.702 m)      General:  Alert and oriented, no acute distress HEENT: 2 cm ulcerated area left frontal occipital scalp Neck: No bruit or JVD Pulmonary: Clear to auscultation bilaterally Cardiac: Regular Rate and Rhythm without murmur Gastrointestinal: Soft, non-tender, non-distended, no mass, periumbilical scar with recurrent hernia,  obese Skin: No rash Extremity Pulses:  2+ radial, brachial, femoral, absent dorsalis pedis, posterior tibial pulses bilaterally Musculoskeletal: No deformity or edema     Neurologic: Upper and lower extremity motor 5/5 and symmetric, no facial droop   DATA:   Recent CT angiogram shows his aneurysm has increased in size 5.8 cm comparing to a CT from one year ago. 3 cm left common iliac aneurysm.     ASSESSMENT: Asymptomatic 40-60% left internal carotid artery stenosis with right internal carotid artery occlusion.   Continue risk factor modification and again emphasized smoking cessation.  Follow-up carotid duplex in January 2017   Abdominal aortic aneurysm Which has a fairly short neck and I do not believe would be amenable to standard stent graft repair but may be fixable with a fenestrated stent graft. I will discuss the patient with my partner Dr. Trula Slade and if it is felt he is a candidate for branch stent graft would proceed with that. Otherwise the patient may require open aortic aneurysm repair. We will get him scheduled for cardiac risk stratification. Additionally I referred him to dermatology for a nonhealing wound on his left for head to make sure this is not a basal cell cancer. Discussed with the patient today if he has  severe abdominal or back pain he should let anyone in the emergency room no that he has no abdominal aortic aneurysm.   PLAN:   see above   Ruta Hinds, MD [facility] Office: [phone] Pager: [phone]

## 2016-08-10 ENCOUNTER — Encounter: Payer: Self-pay | Admitting: Internal Medicine

## 2016-08-17 ENCOUNTER — Ambulatory Visit (INDEPENDENT_AMBULATORY_CARE_PROVIDER_SITE_OTHER): Payer: Medicare Other | Admitting: Internal Medicine

## 2016-08-17 ENCOUNTER — Encounter: Payer: Self-pay | Admitting: Internal Medicine

## 2016-08-17 VITALS — BP 130/70 | HR 66 | Ht 67.0 in | Wt 245.0 lb

## 2016-08-17 DIAGNOSIS — I350 Nonrheumatic aortic (valve) stenosis: Secondary | ICD-10-CM | POA: Diagnosis not present

## 2016-08-17 DIAGNOSIS — Z01810 Encounter for preprocedural cardiovascular examination: Secondary | ICD-10-CM | POA: Diagnosis not present

## 2016-08-17 DIAGNOSIS — Z720 Tobacco use: Secondary | ICD-10-CM | POA: Diagnosis not present

## 2016-08-17 DIAGNOSIS — R0609 Other forms of dyspnea: Secondary | ICD-10-CM | POA: Diagnosis not present

## 2016-08-17 DIAGNOSIS — I739 Peripheral vascular disease, unspecified: Secondary | ICD-10-CM

## 2016-08-17 NOTE — Progress Notes (Signed)
New Outpatient Visit Date: 08/17/2016  Referring Provider: Elam Dutch, MD [address] [place], [place] [address]  Chief Complaint: Preop evaluation for abdominal aortic aneurysm repair  HPI:  Kyle Lewis is a 71 y.o. year-old male with history of PAD (cerebrovascular disease and AAA), stroke, HTN, COPD, sleep apnea, and mild aortic stenosis by echo in 2013, who has been referred by Dr. Oneida Alar for preoperative cardiovascular evaluation in anticipation of AAA repair. The patient underwent spine MRI last year for evaluation of back pain and was incidentally noted to have a 4.8 cm AAA. Abdominal CTA last month showed further enlargement of the aneurysm, which now measures 5.8 x 5.4 cm.  The patient denies a history of cardiac disease, and has not had any chest pain. He notes some exertional dyspnea when walking for extended periods or climbing stairs. He denies orthopnea, PND, leg edema, palpitations, and lightheadedness. At the time of his stroke in 2013, the patient reportedly felt only numbness along his upper lip. Subsequent work-up showed right MCA territory stroke and occlusion of the right internal carotid artery. Moderate disease of the left internal carotid artery has been monitored by Dr. Oneida Alar. The patient's only remaining symptom are tingling in the left hand. This has been stable for several years; he has not had any recent neurologic changes (including weakness, paresthesias, and vision changes). 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 He continues to smoke, though he has tried cutting back. He denies a history of anesthesia/perioperative complications with surgeries in the past.  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  AAA  Aortic stenosis  Risk Factors:  PVD, hypertension,  obesity, sedentary lifestyle, male gender, tobacco use, and age > 48  Cath/PCI:  None  CV Surgery:  None  EP Procedures and Devices:  None  Non-Invasive Evaluation(s):  Carotid Doppler (07/27/16): Chronically occluded right ICA. Stable 40-59% left ICA stenosis.  TTE (04/24/12): Normal LV size and contraction. Mildly thickened aortic valve with mild stenosis (mean gradient 12 mmHg). Normal RV size and function.  Recent CV Pertinent Labs: Lab Results  Component Value Date   CHOL 127 05/04/2012   HDL 37 (L) 05/04/2012   LDLCALC 63 05/04/2012   TRIG 137 05/04/2012   CHOLHDL 3.4 05/04/2012   INR 1.04 06/29/2015   K 4.4 06/29/2015   BUN 12 06/29/2015   CREATININE 0.62 06/29/2015    --------------------------------------------------------------------------------------------------  Past Medical History:  Diagnosis Date  . 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 Adenomatous colon polyp   . Adrenal mass ([place])    2 benign appearing left adrenal adenomas noted on 01/13/15 CT  . Allergy    seasonal  . Aortic stenosis    mild AS by 04/2012 echo  . Arthritis   . BPH (benign prostatic hyperplasia)   . Carotid artery occlusion   . Cataract   . Chronic back pain   . COPD (chronic obstructive pulmonary disease) ([place])   . CVA (cerebral infarction)   . Diverticulosis   . Hypertension   . Nocturia   . Peripheral vascular disease ([place])   . Sleep apnea    wears CPAP  . Stroke [facility]) 2013   tia     Past Surgical History:  Procedure Laterality Date  . APPENDECTOMY    . BRAIN MENINGIOMA EXCISION     menigioma  . CARPAL  TUNNEL RELEASE     left  . cataracts     both eyes  . EYE SURGERY Bilateral    Cataract  . INGUINAL HERNIA REPAIR    . JOINT REPLACEMENT Left 04-01-12   Knee  . MAXIMUM ACCESS (MAS)POSTERIOR LUMBAR INTERBODY FUSION (PLIF) 1 LEVEL N/A 07/07/2015   Procedure:  POSTERIOR LUMBAR INTERBODY FUSION (PLIF) Lumbar  Four-Five with Pedicle Screw Fixation Lumbar Two-Five;Laminectomy Lumbar Two-Five;  Surgeon: Eustace Moore, MD;  Location: [facility];  Service: Neurosurgery;  Laterality: N/A;   POSTERIOR LUMBAR INTERBODY FUSION (PLIF) Lumbar Four-Five with Pedicle Screw Fixation Lumbar Two-Five;Laminectomy Lumbar Two-Five  . PROSTATE SURGERY    . TONSILLECTOMY    . TOTAL KNEE ARTHROPLASTY  04/01/2012   Procedure: TOTAL KNEE ARTHROPLASTY;  Surgeon: Gearlean Alf, MD;  Location: [facility];  Service: Orthopedics;  Laterality: Left;  . TRANSURETHRAL RESECTION OF PROSTATE    . UMBILICAL HERNIA REPAIR  2011    Outpatient Encounter Prescriptions as of 08/17/2016  Medication Sig  . B Complex-C (B-COMPLEX WITH VITAMIN C) tablet Take 1 tablet by mouth daily.  . Cholecalciferol (VITAMIN D-3) 5000 UNITS TABS Take 1 tablet by mouth daily.  . clopidogrel (PLAVIX) 75 MG tablet Take 1 tablet (75 mg total) by mouth daily.  . finasteride (PROSCAR) 5 MG tablet Take 5 mg by mouth daily.  Marland Kitchen losartan-hydrochlorothiazide (HYZAAR) 100-25 MG per tablet Take 1 tablet by mouth every morning.  . Multiple Vitamin (MULTIVITAMIN WITH MINERALS) TABS Take 1 tablet by mouth daily.  Marland Kitchen omega-3 acid ethyl esters (LOVAZA) 1 G capsule Take 2 g by mouth 2 (two) times daily.  . Tamsulosin HCl (FLOMAX) 0.4 MG CAPS Take 0.4 mg by mouth daily after supper.   . vitamin C (ASCORBIC ACID) 500 MG tablet Take 500 mg by mouth daily.  . cyclobenzaprine (FLEXERIL) 10 MG tablet Take 10 mg by mouth 2 (two) times daily as needed for muscle spasms.  . Oxycodone HCl 10 MG TABS 1 (ONE) TABLET BY MOUTH FOUR TIMES DAILY, AS NEEDED  . tiZANidine (ZANAFLEX) 4 MG tablet As needed   No facility-administered encounter medications on file as of 08/17/2016.     Allergies: Patient has no known allergies.  Social History   Social History  . Marital status: Married    Spouse name: Kyle Lewis   . Number of children: 2  . Years of education: College    Occupational  History  .      Self employed Administrator    Social History Main Topics  . Smoking status: Current Every Day Smoker    Packs/day: 0.75    Years: 40.00    Types: Cigarettes  . Smokeless tobacco: Never Used  . Alcohol use Yes     Comment: rare  . Drug use: No  . Sexual activity: Not on file   Other Topics Concern  . Not on file   Social History Narrative   Patient lives at home with his wife. Rhonda    Patient has a Financial risk analyst.    Patient has 2 sons.    Patient smokes a half pack a day.     Family History  Problem Relation Age of Onset  . Heart disease Mother     Onset ~68 y/o  . Hypertension Mother   . Hyperlipidemia Mother   . Diabetes Father   . Heart attack Father   . Heart disease Father     CABG at age 30  . Prostate cancer  Maternal Grandfather   . Colon cancer Neg Hx   . Esophageal cancer Neg Hx   . Rectal cancer Neg Hx   . Stomach cancer Neg Hx     Review of Systems: A 12-system review of systems was performed and was negative except as noted in the HPI.  --------------------------------------------------------------------------------------------------  Physical Exam: BP 130/70   Pulse 66   Ht 5\' 7"  (1.702 m)   Wt 245 lb (111.1 kg)   BMI 38.37 kg/m   General:  Obese man, seated comfortably in the exam room. HEENT: No conjunctival pallor or scleral icterus.  Moist mucous membranes.  OP clear. Neck: Supple without lymphadenopathy, thyromegaly, JVD, or HJR.  No carotid bruit. Lungs: Normal work of breathing. Mildly diminished breath sounds with faint expiratory wheezes at the bases. No crackles. Heart: Distant heart sounds. Regular rate and rhythm without murmurs, rubs, or gallops.  Unable to assess PMI due to body habitus. Abd: Bowel sounds present.  Soft, non-tender, and non-distended. Unable to assess HSM due to body habitus. Ext: 1+ pretibial edema bilaterally.  2+ radial and trace pedal pulses bilaterally. Skin: warm and dry Neuro:  CNIII-XII intact.  Strength and fine-touch sensation intact in upper and lower extremities bilaterally. Psych: Normal mood and affect.  EKG:  Normal sinus rhythm without significant abnormalities. No significant change from prior tracing on 06/29/15 (I have personally reviewed both tracings).  Lab Results  Component Value Date   WBC 10.6 (H) 07/08/2015   HGB 12.9 (L) 07/08/2015   HCT 36.9 (L) 07/08/2015   MCV 90.4 07/08/2015   PLT 214 07/08/2015    Lab Results  Component Value Date   NA 137 06/29/2015   K 4.4 06/29/2015   CL 102 06/29/2015   CO2 27 06/29/2015   BUN 12 06/29/2015   CREATININE 0.62 06/29/2015   GLUCOSE 121 (H) 06/29/2015   ALT 14 05/03/2012    Lab Results  Component Value Date   CHOL 127 05/04/2012   HDL 37 (L) 05/04/2012   LDLCALC 63 05/04/2012   TRIG 137 05/04/2012   CHOLHDL 3.4 05/04/2012    --------------------------------------------------------------------------------------------------  ASSESSMENT AND PLAN: Preoperative cardiovascular risk assessment with dyspnea on exertion and history of aortic stenosis The patient is planning to undergo AAA repair; open vs endovascular repair is still undecided. Though Mr. Karner does not have chest pain, he has multiple cardiovascular risk factors and experienced significant dyspnea when I observed him climb 2 flights of stairs in the office today. Given the high-risk nature of AAA repair, I recommend further evaluation cardiac evaluation. We have agreed to obtain a transthoracic echocardiogram to reevaluate his previously mild aortic stenosis, as well as to exclude other structural abnormalities that could be contributing to his dyspnea. In addition, we will perform a pharmacologic myocardial perfusion stress test to exclude high-risk ischemia. If significant ischemia is seen, we will need to consider coronary angiography before the patient proceeds with elective AAA repair.  Vascular disease Patient has history of  CVA with known cerebrovascular disease, as well as recently discovered AAA. His blood pressure is borderline elevated today. He remains on clopidogrel for secondary prevention of stroke. It does not appear that he is on a statin. Given his ASCVD, he would ideally be on a high-intensity statin. If he has documented intolerance to this, a PCSK9 inhibitor would be another option. I will defer further lipid management to Mr. Waide's PCP.  Tobacco abuse The patient continues to smoke, likely contributing to his dyspnea on exertion and  increasing his risk for a perioperative complication. I encouraged him to stop smoking, ideally at least a month before undergoing surgery.  Follow-up: Return to clinic as needed based on results of echo and stress test.  Nelva Bush, MD 08/17/2016 8:49 PM

## 2016-08-17 NOTE — Patient Instructions (Signed)
Medication Instructions:   Your physician recommends that you continue on your current medications as directed. Please refer to the Current Medication list given to you today.  Labwork: None   Testing/Procedures: Your physician has requested that you have an echocardiogram. Echocardiography is a painless test that uses sound waves to create images of your heart. It provides your doctor with information about the size and shape of your heart and how well your heart's chambers and valves are working. This procedure takes approximately one hour. There are no restrictions for this procedure.  Your physician has requested that you have a lexiscan myoview. For further information please visit [email or web address]. Please follow instruction sheet, as given.    Follow-Up: Your physician recommends that you schedule a follow-up appointment as needed with Dr End.       If you need a refill on your cardiac medications before your next appointment, please call your pharmacy.

## 2016-08-21 ENCOUNTER — Telehealth: Payer: Self-pay | Admitting: Vascular Surgery

## 2016-08-21 NOTE — Telephone Encounter (Signed)
Spoke with pt.  His anatomy is not suited for stent graft traditional or fenestrated.  He will need open repair with aortobiiliac graft.  We will await conclusion of cardiac work up stress test April 2.  Will see if we can move up.  Ruta Hinds, MD [facility] Office: [phone] Pager: [phone]

## 2016-08-24 ENCOUNTER — Encounter (HOSPITAL_COMMUNITY): Payer: Medicare Other

## 2016-08-25 ENCOUNTER — Telehealth: Payer: Self-pay | Admitting: Vascular Surgery

## 2016-08-25 NOTE — Telephone Encounter (Signed)
The heart tests could not be moved up enough that it would make a difference. Informed Kyle Lewis. Sched [facility] f/u 09/07/16 at 3:00. Spoke to pt to inform them of appt.

## 2016-08-25 NOTE — Telephone Encounter (Signed)
-----   Message from Gregery Na, RN sent at 08/22/2016  9:42 AM EDT ----- Regarding: OV Can you see if Mr. Rosevear stress test & echo can be moved to an earlier date? Also, patient will need to see Dr. Oneida Alar in the office after these tests. Please see Dr. Oneida Alar message below.  Thank you, Colletta [place] ----- Message ----- From: Elam Dutch, MD Sent: 08/21/2016   2:45 PM To: Gregery Na, RN  Not a candidate for FEVAR.  He will need open repair.  His stress test is April 2.  Can you see if we can move this up and he will need to see me in office after stress test to discuss open AAA repair  Thanks  Ruta Hinds

## 2016-08-29 ENCOUNTER — Encounter: Payer: Self-pay | Admitting: Vascular Surgery

## 2016-08-30 ENCOUNTER — Telehealth (HOSPITAL_COMMUNITY): Payer: Self-pay | Admitting: *Deleted

## 2016-08-30 NOTE — Telephone Encounter (Signed)
Patient given detailed instructions per Myocardial Perfusion Study Information Sheet for the test on  09/04/16. Patient notified to arrive 15 minutes early and that it is imperative to arrive on time for appointment to keep from having the test rescheduled.  If you need to cancel or reschedule your appointment, please call the office within 24 hours of your appointment. Failure to do so may result in a cancellation of your appointment, and a $50 no show fee. Patient verbalized understanding. Zaahir Pickney Jacqueline  \  

## 2016-09-01 ENCOUNTER — Other Ambulatory Visit (HOSPITAL_COMMUNITY): Payer: Medicare Other

## 2016-09-04 ENCOUNTER — Ambulatory Visit (HOSPITAL_COMMUNITY): Payer: Medicare Other | Attending: Internal Medicine

## 2016-09-04 ENCOUNTER — Encounter (HOSPITAL_COMMUNITY): Payer: Medicare Other

## 2016-09-04 ENCOUNTER — Other Ambulatory Visit (HOSPITAL_COMMUNITY): Payer: Medicare Other

## 2016-09-04 ENCOUNTER — Other Ambulatory Visit: Payer: Self-pay

## 2016-09-04 ENCOUNTER — Ambulatory Visit (HOSPITAL_BASED_OUTPATIENT_CLINIC_OR_DEPARTMENT_OTHER): Payer: Medicare Other

## 2016-09-04 DIAGNOSIS — Z8673 Personal history of transient ischemic attack (TIA), and cerebral infarction without residual deficits: Secondary | ICD-10-CM | POA: Diagnosis not present

## 2016-09-04 DIAGNOSIS — E669 Obesity, unspecified: Secondary | ICD-10-CM | POA: Insufficient documentation

## 2016-09-04 DIAGNOSIS — I350 Nonrheumatic aortic (valve) stenosis: Secondary | ICD-10-CM

## 2016-09-04 DIAGNOSIS — Z8249 Family history of ischemic heart disease and other diseases of the circulatory system: Secondary | ICD-10-CM | POA: Diagnosis not present

## 2016-09-04 DIAGNOSIS — G4733 Obstructive sleep apnea (adult) (pediatric): Secondary | ICD-10-CM | POA: Diagnosis not present

## 2016-09-04 DIAGNOSIS — J449 Chronic obstructive pulmonary disease, unspecified: Secondary | ICD-10-CM | POA: Insufficient documentation

## 2016-09-04 DIAGNOSIS — Z6838 Body mass index (BMI) 38.0-38.9, adult: Secondary | ICD-10-CM | POA: Diagnosis not present

## 2016-09-04 DIAGNOSIS — I10 Essential (primary) hypertension: Secondary | ICD-10-CM | POA: Diagnosis not present

## 2016-09-04 DIAGNOSIS — R9439 Abnormal result of other cardiovascular function study: Secondary | ICD-10-CM | POA: Insufficient documentation

## 2016-09-04 DIAGNOSIS — R0609 Other forms of dyspnea: Secondary | ICD-10-CM | POA: Diagnosis not present

## 2016-09-04 DIAGNOSIS — I739 Peripheral vascular disease, unspecified: Secondary | ICD-10-CM | POA: Insufficient documentation

## 2016-09-04 LAB — ECHOCARDIOGRAM COMPLETE
Height: 67 in
Weight: 3920 oz

## 2016-09-04 LAB — MYOCARDIAL PERFUSION IMAGING
LV dias vol: 114 mL (ref 62–150)
LV sys vol: 50 mL
Peak HR: 99 {beats}/min
RATE: 0.27
Rest HR: 68 {beats}/min
SDS: 4
SRS: 4
SSS: 8
TID: 1.02

## 2016-09-04 MED ORDER — PERFLUTREN LIPID MICROSPHERE
1.0000 mL | INTRAVENOUS | Status: AC | PRN
  Administered 2016-09-04: 1 mL via INTRAVENOUS

## 2016-09-04 MED ORDER — REGADENOSON 0.4 MG/5ML IV SOLN
0.4000 mg | Freq: Once | INTRAVENOUS | Status: AC
  Administered 2016-09-04: 0.4 mg via INTRAVENOUS

## 2016-09-04 MED ORDER — TECHNETIUM TC 99M TETROFOSMIN IV KIT
10.2000 | PACK | Freq: Once | INTRAVENOUS | Status: AC | PRN
  Administered 2016-09-04: 10.2 via INTRAVENOUS
  Filled 2016-09-04: qty 11

## 2016-09-04 MED ORDER — TECHNETIUM TC 99M TETROFOSMIN IV KIT
30.8000 | PACK | Freq: Once | INTRAVENOUS | Status: AC | PRN
  Administered 2016-09-04: 30.8 via INTRAVENOUS
  Filled 2016-09-04: qty 31

## 2016-09-05 ENCOUNTER — Telehealth: Payer: Self-pay | Admitting: Internal Medicine

## 2016-09-05 ENCOUNTER — Encounter: Payer: Self-pay | Admitting: *Deleted

## 2016-09-05 DIAGNOSIS — I714 Abdominal aortic aneurysm, without rupture, unspecified: Secondary | ICD-10-CM

## 2016-09-05 DIAGNOSIS — R9439 Abnormal result of other cardiovascular function study: Secondary | ICD-10-CM

## 2016-09-05 DIAGNOSIS — I350 Nonrheumatic aortic (valve) stenosis: Secondary | ICD-10-CM

## 2016-09-05 DIAGNOSIS — Z01812 Encounter for preprocedural laboratory examination: Secondary | ICD-10-CM

## 2016-09-05 DIAGNOSIS — R0609 Other forms of dyspnea: Principal | ICD-10-CM

## 2016-09-05 NOTE — Telephone Encounter (Signed)
I spoke with pt's wife and verbally went over cath instructions with her. Pt will come in for lab work on 09/11/16.  I will mail copy of instructions to pt and also leave copy at front desk for him to pick up when he comes in for lab work.

## 2016-09-05 NOTE — Telephone Encounter (Signed)
Cath arranged for 09/15/16 at 7:30.  Will need to arrive at 5:30.  I spoke with pt and he is available to have cath as scheduled.  He is currently out and not able to go over instructions.  He will call us back later this afternoon.

## 2016-09-05 NOTE — Telephone Encounter (Signed)
Echocardiogram and myocardial perfusion stress test were reviewed with the patient. Echo shows mild to moderate AS, slightly worse than on prior study. LVEF is normal. Stress test reveals an intermediate-risk defect involving the inferior wall and septum, concerning for possible ischemia. I have touched base with Dr. Oneida Alar and the patient, and we have agreed to perform left heart catheterization for further assessment. We will obtain routine precath labs and tentatively scheduled the procedure for 09/15/16. I have reviewed the risks, indications, and alternatives to cardiac catheterization, possible angioplasty, and stenting with the patient. Risks include but are not limited to bleeding, infection, vascular injury, stroke, myocardial infection, arrhythmia, kidney injury, radiation-related injury in the case of prolonged fluoroscopy use, emergency cardiac surgery, and death. The patient understands the risks of serious complication is 1-2 in 6283 with diagnostic cardiac cath and 1-2% or less with angioplasty/stenting.  Nelva Bush, MD [facility] Pager: [phone]

## 2016-09-07 ENCOUNTER — Ambulatory Visit: Payer: Medicare Other | Admitting: Vascular Surgery

## 2016-09-11 ENCOUNTER — Other Ambulatory Visit: Payer: Medicare Other

## 2016-09-11 DIAGNOSIS — I714 Abdominal aortic aneurysm, without rupture, unspecified: Secondary | ICD-10-CM

## 2016-09-11 DIAGNOSIS — R0609 Other forms of dyspnea: Principal | ICD-10-CM

## 2016-09-11 DIAGNOSIS — I350 Nonrheumatic aortic (valve) stenosis: Secondary | ICD-10-CM

## 2016-09-11 DIAGNOSIS — Z01812 Encounter for preprocedural laboratory examination: Secondary | ICD-10-CM

## 2016-09-11 DIAGNOSIS — R9439 Abnormal result of other cardiovascular function study: Secondary | ICD-10-CM

## 2016-09-12 LAB — BASIC METABOLIC PANEL
BUN/Creatinine Ratio: 16 (ref 10–24)
BUN: 11 mg/dL (ref 8–27)
CO2: 29 mmol/L (ref 18–29)
Calcium: 9 mg/dL (ref 8.6–10.2)
Chloride: 95 mmol/L — ABNORMAL LOW (ref 96–106)
Creatinine, Ser: 0.68 mg/dL — ABNORMAL LOW (ref 0.76–1.27)
GFR calc Af Amer: 112 mL/min/{1.73_m2} (ref >59)
GFR calc non Af Amer: 97 mL/min/{1.73_m2} (ref >59)
Glucose: 118 mg/dL — ABNORMAL HIGH (ref 65–99)
Potassium: 4.2 mmol/L (ref 3.5–5.2)
Sodium: 138 mmol/L (ref 134–144)

## 2016-09-12 LAB — CBC WITH DIFFERENTIAL/PLATELET
Basophils Absolute: 0 10*3/uL (ref 0.0–0.2)
Basos: 0 %
EOS (ABSOLUTE): 0.3 10*3/uL (ref 0.0–0.4)
Eos: 3 %
Hematocrit: 46.3 % (ref 37.5–51.0)
Hemoglobin: 15.5 g/dL (ref 13.0–17.7)
Immature Grans (Abs): 0 10*3/uL (ref 0.0–0.1)
Immature Granulocytes: 0 %
Lymphocytes Absolute: 2.6 10*3/uL (ref 0.7–3.1)
Lymphs: 28 %
MCH: 30.4 pg (ref 26.6–33.0)
MCHC: 33.5 g/dL (ref 31.5–35.7)
MCV: 91 fL (ref 79–97)
Monocytes Absolute: 0.7 10*3/uL (ref 0.1–0.9)
Monocytes: 7 %
Neutrophils Absolute: 5.9 10*3/uL (ref 1.4–7.0)
Neutrophils: 62 %
Platelets: 251 10*3/uL (ref 150–379)
RBC: 5.1 x10E6/uL (ref 4.14–5.80)
RDW: 14.4 % (ref 12.3–15.4)
WBC: 9.5 10*3/uL (ref 3.4–10.8)

## 2016-09-12 LAB — PROTIME-INR
INR: 1 (ref 0.8–1.2)
Prothrombin Time: 10.6 s (ref 9.1–12.0)

## 2016-09-14 ENCOUNTER — Other Ambulatory Visit: Payer: Self-pay | Admitting: Internal Medicine

## 2016-09-14 DIAGNOSIS — R9439 Abnormal result of other cardiovascular function study: Secondary | ICD-10-CM

## 2016-09-14 DIAGNOSIS — R0602 Shortness of breath: Secondary | ICD-10-CM

## 2016-09-15 ENCOUNTER — Ambulatory Visit (HOSPITAL_COMMUNITY)
Admission: RE | Admit: 2016-09-15 | Discharge: 2016-09-15 | Disposition: A | Discharge status: Home / Self Care | Payer: Medicare Other | Source: Ambulatory Visit | Attending: Internal Medicine | Admitting: Internal Medicine

## 2016-09-15 ENCOUNTER — Encounter (HOSPITAL_COMMUNITY)
Admission: RE | Disposition: A | Discharge status: Home / Self Care | Payer: Self-pay | Source: Ambulatory Visit | Attending: Internal Medicine

## 2016-09-15 DIAGNOSIS — R351 Nocturia: Secondary | ICD-10-CM | POA: Diagnosis not present

## 2016-09-15 DIAGNOSIS — Z8249 Family history of ischemic heart disease and other diseases of the circulatory system: Secondary | ICD-10-CM | POA: Diagnosis not present

## 2016-09-15 DIAGNOSIS — Z01810 Encounter for preprocedural cardiovascular examination: Secondary | ICD-10-CM | POA: Diagnosis not present

## 2016-09-15 DIAGNOSIS — I739 Peripheral vascular disease, unspecified: Secondary | ICD-10-CM | POA: Diagnosis not present

## 2016-09-15 DIAGNOSIS — I2510 Atherosclerotic heart disease of native coronary artery without angina pectoris: Secondary | ICD-10-CM | POA: Diagnosis not present

## 2016-09-15 DIAGNOSIS — I10 Essential (primary) hypertension: Secondary | ICD-10-CM | POA: Diagnosis not present

## 2016-09-15 DIAGNOSIS — G8929 Other chronic pain: Secondary | ICD-10-CM | POA: Insufficient documentation

## 2016-09-15 DIAGNOSIS — F17210 Nicotine dependence, cigarettes, uncomplicated: Secondary | ICD-10-CM | POA: Diagnosis not present

## 2016-09-15 DIAGNOSIS — I6523 Occlusion and stenosis of bilateral carotid arteries: Secondary | ICD-10-CM | POA: Insufficient documentation

## 2016-09-15 DIAGNOSIS — Z7902 Long term (current) use of antithrombotics/antiplatelets: Secondary | ICD-10-CM | POA: Diagnosis not present

## 2016-09-15 DIAGNOSIS — Z6838 Body mass index (BMI) 38.0-38.9, adult: Secondary | ICD-10-CM | POA: Insufficient documentation

## 2016-09-15 DIAGNOSIS — G4730 Sleep apnea, unspecified: Secondary | ICD-10-CM | POA: Insufficient documentation

## 2016-09-15 DIAGNOSIS — I350 Nonrheumatic aortic (valve) stenosis: Secondary | ICD-10-CM | POA: Diagnosis not present

## 2016-09-15 DIAGNOSIS — Z8673 Personal history of transient ischemic attack (TIA), and cerebral infarction without residual deficits: Secondary | ICD-10-CM | POA: Diagnosis not present

## 2016-09-15 DIAGNOSIS — R9439 Abnormal result of other cardiovascular function study: Secondary | ICD-10-CM

## 2016-09-15 DIAGNOSIS — M549 Dorsalgia, unspecified: Secondary | ICD-10-CM | POA: Insufficient documentation

## 2016-09-15 DIAGNOSIS — N401 Enlarged prostate with lower urinary tract symptoms: Secondary | ICD-10-CM | POA: Insufficient documentation

## 2016-09-15 DIAGNOSIS — E669 Obesity, unspecified: Secondary | ICD-10-CM | POA: Diagnosis not present

## 2016-09-15 DIAGNOSIS — J449 Chronic obstructive pulmonary disease, unspecified: Secondary | ICD-10-CM | POA: Diagnosis not present

## 2016-09-15 DIAGNOSIS — M1990 Unspecified osteoarthritis, unspecified site: Secondary | ICD-10-CM | POA: Insufficient documentation

## 2016-09-15 DIAGNOSIS — I714 Abdominal aortic aneurysm, without rupture: Secondary | ICD-10-CM | POA: Insufficient documentation

## 2016-09-15 DIAGNOSIS — R0602 Shortness of breath: Secondary | ICD-10-CM

## 2016-09-15 HISTORY — PX: LEFT HEART CATH AND CORONARY ANGIOGRAPHY: CATH118249

## 2016-09-15 LAB — LIPID PANEL
Cholesterol: 157 mg/dL (ref 0–200)
HDL: 39 mg/dL — ABNORMAL LOW (ref >40)
LDL Cholesterol: 104 mg/dL — ABNORMAL HIGH (ref 0–99)
Total CHOL/HDL Ratio: 4 RATIO
Triglycerides: 68 mg/dL (ref <150)
VLDL: 14 mg/dL (ref 0–40)

## 2016-09-15 LAB — ALT: ALT: 17 U/L (ref 17–63)

## 2016-09-15 SURGERY — LEFT HEART CATH AND CORONARY ANGIOGRAPHY
Anesthesia: LOCAL

## 2016-09-15 MED ORDER — MIDAZOLAM HCL 2 MG/2ML IJ SOLN
INTRAMUSCULAR | Status: DC | PRN
  Administered 2016-09-15: 1 mg via INTRAVENOUS

## 2016-09-15 MED ORDER — ROSUVASTATIN CALCIUM 5 MG PO TABS: 5.0000 mg | ORAL_TABLET | Freq: Every day | ORAL | 5 refills | Status: DC

## 2016-09-15 MED ORDER — VERAPAMIL HCL 2.5 MG/ML IV SOLN
INTRAVENOUS | Status: AC
  Filled 2016-09-15: qty 2

## 2016-09-15 MED ORDER — LIDOCAINE HCL (PF) 1 % IJ SOLN
INTRAMUSCULAR | Status: DC | PRN
  Administered 2016-09-15: 2 mL via INTRADERMAL

## 2016-09-15 MED ORDER — CLOPIDOGREL BISULFATE 75 MG PO TABS: 75.0000 mg | ORAL_TABLET | Freq: Once | ORAL | Status: DC

## 2016-09-15 MED ORDER — VERAPAMIL HCL 2.5 MG/ML IV SOLN
INTRAVENOUS | Status: DC | PRN
  Administered 2016-09-15: 10 mL via INTRA_ARTERIAL

## 2016-09-15 MED ORDER — IOPAMIDOL (ISOVUE-370) INJECTION 76%
INTRAVENOUS | Status: AC
  Filled 2016-09-15: qty 100

## 2016-09-15 MED ORDER — FENTANYL CITRATE (PF) 100 MCG/2ML IJ SOLN
INTRAMUSCULAR | Status: DC | PRN
  Administered 2016-09-15: 25 ug via INTRAVENOUS

## 2016-09-15 MED ORDER — IOPAMIDOL (ISOVUE-370) INJECTION 76%
INTRAVENOUS | Status: DC | PRN
  Administered 2016-09-15: 50 mL via INTRA_ARTERIAL

## 2016-09-15 MED ORDER — HEPARIN SODIUM (PORCINE) 1000 UNIT/ML IJ SOLN
INTRAMUSCULAR | Status: DC | PRN
  Administered 2016-09-15: 5000 [IU] via INTRAVENOUS

## 2016-09-15 MED ORDER — LIDOCAINE HCL (PF) 1 % IJ SOLN
INTRAMUSCULAR | Status: AC
  Filled 2016-09-15: qty 30

## 2016-09-15 MED ORDER — AMLODIPINE BESYLATE 2.5 MG PO TABS: 2.5000 mg | ORAL_TABLET | Freq: Every day | ORAL | 5 refills | Status: DC

## 2016-09-15 MED ORDER — SODIUM CHLORIDE 0.9 % IV SOLN: 250.0000 mL | INTRAVENOUS | Status: DC | PRN

## 2016-09-15 MED ORDER — HEPARIN SODIUM (PORCINE) 1000 UNIT/ML IJ SOLN
INTRAMUSCULAR | Status: AC
  Filled 2016-09-15: qty 1

## 2016-09-15 MED ORDER — SODIUM CHLORIDE 0.9 % WEIGHT BASED INFUSION
3.0000 mL/kg/h | INTRAVENOUS | Status: DC
  Administered 2016-09-15: 3 mL/kg/h via INTRAVENOUS

## 2016-09-15 MED ORDER — FENTANYL CITRATE (PF) 100 MCG/2ML IJ SOLN
INTRAMUSCULAR | Status: AC
  Filled 2016-09-15: qty 2

## 2016-09-15 MED ORDER — SODIUM CHLORIDE 0.9 % WEIGHT BASED INFUSION: 1.0000 mL/kg/h | INTRAVENOUS | Status: DC

## 2016-09-15 MED ORDER — HEPARIN (PORCINE) IN NACL 2-0.9 UNIT/ML-% IJ SOLN
INTRAMUSCULAR | Status: DC | PRN
  Administered 2016-09-15: 1000 mL via INTRA_ARTERIAL

## 2016-09-15 MED ORDER — SODIUM CHLORIDE 0.9% FLUSH: 3.0000 mL | Freq: Two times a day (BID) | INTRAVENOUS | Status: DC

## 2016-09-15 MED ORDER — SODIUM CHLORIDE 0.9 % IV SOLN: INTRAVENOUS | Status: DC

## 2016-09-15 MED ORDER — SODIUM CHLORIDE 0.9% FLUSH: 3.0000 mL | INTRAVENOUS | Status: DC | PRN

## 2016-09-15 MED ORDER — ASPIRIN 81 MG PO CHEW
CHEWABLE_TABLET | ORAL | Status: AC
  Administered 2016-09-15: 81 mg via ORAL
  Filled 2016-09-15: qty 1

## 2016-09-15 MED ORDER — ASPIRIN 81 MG PO CHEW
81.0000 mg | CHEWABLE_TABLET | ORAL | Status: AC
  Administered 2016-09-15: 81 mg via ORAL

## 2016-09-15 MED ORDER — HEPARIN (PORCINE) IN NACL 2-0.9 UNIT/ML-% IJ SOLN
INTRAMUSCULAR | Status: AC
  Filled 2016-09-15: qty 1000

## 2016-09-15 MED ORDER — MIDAZOLAM HCL 2 MG/2ML IJ SOLN
INTRAMUSCULAR | Status: AC
  Filled 2016-09-15: qty 2

## 2016-09-15 SURGICAL SUPPLY — 9 items
CATH 5FR JL3.5 JR4 ANG PIG MP (CATHETERS) ×2 IMPLANT
DEVICE RAD COMP TR BAND LRG (VASCULAR PRODUCTS) ×2 IMPLANT
GLIDESHEATH SLEND SS 6F .021 (SHEATH) ×2 IMPLANT
GUIDEWIRE INQWIRE 1.5J.035X260 (WIRE) ×1 IMPLANT
INQWIRE 1.5J .035X260CM (WIRE) ×2
KIT HEART LEFT (KITS) ×2 IMPLANT
PACK CARDIAC CATHETERIZATION (CUSTOM PROCEDURE TRAY) ×2 IMPLANT
TRANSDUCER W/STOPCOCK (MISCELLANEOUS) ×2 IMPLANT
TUBING CIL FLEX 10 FLL-RA (TUBING) ×2 IMPLANT

## 2016-09-15 NOTE — Interval H&P Note (Signed)
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 The various methods of treatment have been discussed with the patient and family. After consideration of risks, benefits and other options for treatment, the patient has consented to  Procedure(s): Left Heart Cath and Coronary Angiography (N/A) as a surgical intervention .  The patient's history has been reviewed, patient examined, no change in status, stable for surgery.  I have reviewed the patient's chart and labs.  Questions were answered to the patient's satisfaction.    Cath Lab Visit (complete for each Cath Lab visit)  Clinical Evaluation Leading to the Procedure:   ACS: No.  Non-ACS:    Anginal Classification: CCS III  Anti-ischemic medical therapy: No Therapy  Non-Invasive Test Results: Intermediate-risk stress test findings: cardiac mortality 1-3%/year  Prior CABG: No previous CABG   Kyle Lewis

## 2016-09-15 NOTE — H&P (View-Only) (Signed)
New Outpatient Visit Date: 08/17/2016  Referring Provider: Elam Dutch, MD [address] [place], [place] [address]  Chief Complaint: Preop evaluation for abdominal aortic aneurysm repair  HPI:  Kyle Lewis is a 71 y.o. year-old male with history of PAD (cerebrovascular disease and AAA), stroke, HTN, COPD, sleep apnea, and mild aortic stenosis by echo in 2013, who has been referred by Dr. Oneida Alar for preoperative cardiovascular evaluation in anticipation of AAA repair. The patient underwent spine MRI last year for evaluation of back pain and was incidentally noted to have a 4.8 cm AAA. Abdominal CTA last month showed further enlargement of the aneurysm, which now measures 5.8 x 5.4 cm.  The patient denies a history of cardiac disease, and has not had any chest pain. He notes some exertional dyspnea when walking for extended periods or climbing stairs. He denies orthopnea, PND, leg edema, palpitations, and lightheadedness. At the time of his stroke in 2013, the patient reportedly felt only numbness along his upper lip. Subsequent work-up showed right MCA territory stroke and occlusion of the right internal carotid artery. Moderate disease of the left internal carotid artery has been monitored by Dr. Oneida Alar. The patient's only remaining symptom are tingling in the left hand. This has been stable for several years; he has not had any recent neurologic changes (including weakness, paresthesias, and vision changes). 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 He continues to smoke, though he has tried cutting back. He denies a history of anesthesia/perioperative complications with surgeries in the past.  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  AAA  Aortic stenosis  Risk Factors:  PVD, hypertension,  obesity, sedentary lifestyle, male gender, tobacco use, and age > 59  Cath/PCI:  None  CV Surgery:  None  EP Procedures and Devices:  None  Non-Invasive Evaluation(s):  Carotid Doppler (07/27/16): Chronically occluded right ICA. Stable 40-59% left ICA stenosis.  TTE (04/24/12): Normal LV size and contraction. Mildly thickened aortic valve with mild stenosis (mean gradient 12 mmHg). Normal RV size and function.  Recent CV Pertinent Labs: Lab Results  Component Value Date   CHOL 127 05/04/2012   HDL 37 (L) 05/04/2012   LDLCALC 63 05/04/2012   TRIG 137 05/04/2012   CHOLHDL 3.4 05/04/2012   INR 1.04 06/29/2015   K 4.4 06/29/2015   BUN 12 06/29/2015   CREATININE 0.62 06/29/2015    --------------------------------------------------------------------------------------------------  Past Medical History:  Diagnosis Date  . 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 Adenomatous colon polyp   . Adrenal mass ([place])    2 benign appearing left adrenal adenomas noted on 01/13/15 CT  . Allergy    seasonal  . Aortic stenosis    mild AS by 04/2012 echo  . Arthritis   . BPH (benign prostatic hyperplasia)   . Carotid artery occlusion   . Cataract   . Chronic back pain   . COPD (chronic obstructive pulmonary disease) ([place] [facility])   . CVA (cerebral infarction)   . Diverticulosis   . Hypertension   . Nocturia   . Peripheral vascular disease ([place])   . Sleep apnea    wears CPAP  . Stroke [facility]) 2013   tia     Past Surgical History:  Procedure Laterality Date  . APPENDECTOMY    . BRAIN MENINGIOMA EXCISION     menigioma  . CARPAL  TUNNEL RELEASE     left  . cataracts     both eyes  . EYE SURGERY Bilateral    Cataract  . INGUINAL HERNIA REPAIR    . JOINT REPLACEMENT Left 04-01-12   Knee  . MAXIMUM ACCESS (MAS)POSTERIOR LUMBAR INTERBODY FUSION (PLIF) 1 LEVEL N/A 07/07/2015   Procedure:  POSTERIOR LUMBAR INTERBODY FUSION (PLIF) Lumbar  Four-Five with Pedicle Screw Fixation Lumbar Two-Five;Laminectomy Lumbar Two-Five;  Surgeon: Eustace Moore, MD;  Location: [facility];  Service: Neurosurgery;  Laterality: N/A;   POSTERIOR LUMBAR INTERBODY FUSION (PLIF) Lumbar Four-Five with Pedicle Screw Fixation Lumbar Two-Five;Laminectomy Lumbar Two-Five  . PROSTATE SURGERY    . TONSILLECTOMY    . TOTAL KNEE ARTHROPLASTY  04/01/2012   Procedure: TOTAL KNEE ARTHROPLASTY;  Surgeon: Gearlean Alf, MD;  Location: [facility];  Service: Orthopedics;  Laterality: Left;  . TRANSURETHRAL RESECTION OF PROSTATE    . UMBILICAL HERNIA REPAIR  2011    Outpatient Encounter Prescriptions as of 08/17/2016  Medication Sig  . B Complex-C (B-COMPLEX WITH VITAMIN C) tablet Take 1 tablet by mouth daily.  . Cholecalciferol (VITAMIN D-3) 5000 UNITS TABS Take 1 tablet by mouth daily.  . clopidogrel (PLAVIX) 75 MG tablet Take 1 tablet (75 mg total) by mouth daily.  . finasteride (PROSCAR) 5 MG tablet Take 5 mg by mouth daily.  Marland Kitchen losartan-hydrochlorothiazide (HYZAAR) 100-25 MG per tablet Take 1 tablet by mouth every morning.  . Multiple Vitamin (MULTIVITAMIN WITH MINERALS) TABS Take 1 tablet by mouth daily.  Marland Kitchen omega-3 acid ethyl esters (LOVAZA) 1 G capsule Take 2 g by mouth 2 (two) times daily.  . Tamsulosin HCl (FLOMAX) 0.4 MG CAPS Take 0.4 mg by mouth daily after supper.   . vitamin C (ASCORBIC ACID) 500 MG tablet Take 500 mg by mouth daily.  . cyclobenzaprine (FLEXERIL) 10 MG tablet Take 10 mg by mouth 2 (two) times daily as needed for muscle spasms.  . Oxycodone HCl 10 MG TABS 1 (ONE) TABLET BY MOUTH FOUR TIMES DAILY, AS NEEDED  . tiZANidine (ZANAFLEX) 4 MG tablet As needed   No facility-administered encounter medications on file as of 08/17/2016.     Allergies: Patient has no known allergies.  Social History   Social History  . Marital status: Married    Spouse name: Kyle Lewis   . Number of children: 2  . Years of education: College    Occupational  History  .      Self employed Administrator    Social History Main Topics  . Smoking status: Current Every Day Smoker    Packs/day: 0.75    Years: 40.00    Types: Cigarettes  . Smokeless tobacco: Never Used  . Alcohol use Yes     Comment: rare  . Drug use: No  . Sexual activity: Not on file   Other Topics Concern  . Not on file   Social History Narrative   Patient lives at home with his wife. Rhonda    Patient has a Financial risk analyst.    Patient has 2 sons.    Patient smokes a half pack a day.     Family History  Problem Relation Age of Onset  . Heart disease Mother     Onset ~29 y/o  . Hypertension Mother   . Hyperlipidemia Mother   . Diabetes Father   . Heart attack Father   . Heart disease Father     CABG at age 47  . Prostate cancer  Maternal Grandfather   . Colon cancer Neg Hx   . Esophageal cancer Neg Hx   . Rectal cancer Neg Hx   . Stomach cancer Neg Hx     Review of Systems: A 12-system review of systems was performed and was negative except as noted in the HPI.  --------------------------------------------------------------------------------------------------  Physical Exam: BP 130/70   Pulse 66   Ht 5\' 7"  (1.702 m)   Wt 245 lb (111.1 kg)   BMI 38.37 kg/m   General:  Obese man, seated comfortably in the exam room. HEENT: No conjunctival pallor or scleral icterus.  Moist mucous membranes.  OP clear. Neck: Supple without lymphadenopathy, thyromegaly, JVD, or HJR.  No carotid bruit. Lungs: Normal work of breathing. Mildly diminished breath sounds with faint expiratory wheezes at the bases. No crackles. Heart: Distant heart sounds. Regular rate and rhythm without murmurs, rubs, or gallops.  Unable to assess PMI due to body habitus. Abd: Bowel sounds present.  Soft, non-tender, and non-distended. Unable to assess HSM due to body habitus. Ext: 1+ pretibial edema bilaterally.  2+ radial and trace pedal pulses bilaterally. Skin: warm and dry Neuro:  CNIII-XII intact.  Strength and fine-touch sensation intact in upper and lower extremities bilaterally. Psych: Normal mood and affect.  EKG:  Normal sinus rhythm without significant abnormalities. No significant change from prior tracing on 06/29/15 (I have personally reviewed both tracings).  Lab Results  Component Value Date   WBC 10.6 (H) 07/08/2015   HGB 12.9 (L) 07/08/2015   HCT 36.9 (L) 07/08/2015   MCV 90.4 07/08/2015   PLT 214 07/08/2015    Lab Results  Component Value Date   NA 137 06/29/2015   K 4.4 06/29/2015   CL 102 06/29/2015   CO2 27 06/29/2015   BUN 12 06/29/2015   CREATININE 0.62 06/29/2015   GLUCOSE 121 (H) 06/29/2015   ALT 14 05/03/2012    Lab Results  Component Value Date   CHOL 127 05/04/2012   HDL 37 (L) 05/04/2012   LDLCALC 63 05/04/2012   TRIG 137 05/04/2012   CHOLHDL 3.4 05/04/2012    --------------------------------------------------------------------------------------------------  ASSESSMENT AND PLAN: Preoperative cardiovascular risk assessment with dyspnea on exertion and history of aortic stenosis The patient is planning to undergo AAA repair; open vs endovascular repair is still undecided. Though Mr. Inclan does not have chest pain, he has multiple cardiovascular risk factors and experienced significant dyspnea when I observed him climb 2 flights of stairs in the office today. Given the high-risk nature of AAA repair, I recommend further evaluation cardiac evaluation. We have agreed to obtain a transthoracic echocardiogram to reevaluate his previously mild aortic stenosis, as well as to exclude other structural abnormalities that could be contributing to his dyspnea. In addition, we will perform a pharmacologic myocardial perfusion stress test to exclude high-risk ischemia. If significant ischemia is seen, we will need to consider coronary angiography before the patient proceeds with elective AAA repair.  Vascular disease Patient has history of  CVA with known cerebrovascular disease, as well as recently discovered AAA. His blood pressure is borderline elevated today. He remains on clopidogrel for secondary prevention of stroke. It does not appear that he is on a statin. Given his ASCVD, he would ideally be on a high-intensity statin. If he has documented intolerance to this, a PCSK9 inhibitor would be another option. I will defer further lipid management to Mr. Atayde's PCP.  Tobacco abuse The patient continues to smoke, likely contributing to his dyspnea on exertion and  increasing his risk for a perioperative complication. I encouraged him to stop smoking, ideally at least a month before undergoing surgery.  Follow-up: Return to clinic as needed based on results of echo and stress test.  Nelva Bush, MD 08/17/2016 8:49 PM

## 2016-09-15 NOTE — Progress Notes (Signed)
Dr. Saunders Revel at bedside. Aware that pt took Plavix at 2000 last night. Dr. Saunders Revel verbalized to give 81 mg aspirin but no Plavix at this time.

## 2016-09-15 NOTE — Brief Op Note (Signed)
Brief Cardiac Catheterization Note (Full Report to Follow)  Date: 09/15/2016 Time: 7:54 AM  PATIENT:  Kyle Lewis  71 y.o. male  PRE-OPERATIVE DIAGNOSIS:  Dyspnea on exertion, abnormal stress test  POST-OPERATIVE DIAGNOSIS:  Same  PROCEDURE:  Procedure(s): Left Heart Cath and Coronary Angiography (N/A)  SURGEON:  Surgeon(s) and Role:    * Nelva Bush, MD - Primary  FINDINGS: 1. Moderate-severe but noncritical coronary artery disease involving mid PDA and OM2. 2. Normal left ventricular filling pressure. 3. Mild to moderate aortic valve peak-to-peak gradient.  RECOMMENDATIONS: 1. Aggressive medical therapy. 2. Given the lack of chest pain and involvement only of small to moderate-caliber vessels, I do not feel that stenting of the OM or PDA would provide any significant perioperative risk reduction. It is reasonable to proceed with AAA repair. 3. Initiate low-dose amlodipine for blood pressure control and antianginal therapy to see if this improves his shortness of breath. 4. Smoking cessation counseling.  Nelva Bush, MD [facility] Pager: [phone]

## 2016-09-15 NOTE — Discharge Instructions (Signed)
Radial Site Care °Refer to this sheet in the next few weeks. These instructions provide you with information about caring for yourself after your procedure. Your health care provider may also give you more specific instructions. Your treatment has been planned according to current medical practices, but problems sometimes occur. Call your health care provider if you have any problems or questions after your procedure. °What can I expect after the procedure? °After your procedure, it is typical to have the following: °· Bruising at the radial site that usually fades within 1-2 weeks. °· Blood collecting in the tissue (hematoma) that may be painful to the touch. It should usually decrease in size and tenderness within 1-2 weeks. °Follow these instructions at home: °· Take medicines only as directed by your health care provider. °· You may shower 24-48 hours after the procedure or as directed by your health care provider. Remove the bandage (dressing) and gently wash the site with plain soap and water. Pat the area dry with a clean towel. Do not rub the site, because this may cause bleeding. °· Do not take baths, swim, or use a hot tub until your health care provider approves. °· Check your insertion site every day for redness, swelling, or drainage. °· Do not apply powder or lotion to the site. °· Do not flex or bend the affected arm for 24 hours or as directed by your health care provider. °· Do not push or pull heavy objects with the affected arm for 24 hours or as directed by your health care provider. °· Do not lift over 10 lb (4.5 kg) for 5 days after your procedure or as directed by your health care provider. °· Ask your health care provider when it is okay to: °¨ Return to work or school. °¨ Resume usual physical activities or sports. °¨ Resume sexual activity. °· Do not drive home if you are discharged the same day as the procedure. Have someone else drive you. °· You may drive 24 hours after the procedure  unless otherwise instructed by your health care provider. °· Do not operate machinery or power tools for 24 hours after the procedure. °· If your procedure was done as an outpatient procedure, which means that you went home the same day as your procedure, a responsible adult should be with you for the first 24 hours after you arrive home. °· Keep all follow-up visits as directed by your health care provider. This is important. °Contact a health care provider if: °· You have a fever. °· You have chills. °· You have increased bleeding from the radial site. Hold pressure on the site. °Get help right away if: °· You have unusual pain at the radial site. °· You have redness, warmth, or swelling at the radial site. °· You have drainage (other than a small amount of blood on the dressing) from the radial site. °· The radial site is bleeding, and the bleeding does not stop after 30 minutes of holding steady pressure on the site. °· Your arm or hand becomes pale, cool, tingly, or numb. °This information is not intended to replace advice given to you by your health care provider. Make sure you discuss any questions you have with your health care provider. °Document Released: 06/24/2010 Document Revised: 10/28/2015 Document Reviewed: 12/08/2013 °Elsevier Interactive Patient Education © 2017 [employer] ° °

## 2016-09-18 ENCOUNTER — Encounter (HOSPITAL_COMMUNITY): Payer: Self-pay | Admitting: Internal Medicine

## 2016-09-27 ENCOUNTER — Encounter: Payer: Self-pay | Admitting: Vascular Surgery

## 2016-09-28 ENCOUNTER — Other Ambulatory Visit: Payer: Self-pay | Admitting: Nurse Practitioner

## 2016-10-02 ENCOUNTER — Other Ambulatory Visit: Payer: Self-pay | Admitting: Neurology

## 2016-10-02 ENCOUNTER — Telehealth: Payer: Self-pay | Admitting: Nurse Practitioner

## 2016-10-02 DIAGNOSIS — Z8673 Personal history of transient ischemic attack (TIA), and cerebral infarction without residual deficits: Secondary | ICD-10-CM

## 2016-10-02 MED ORDER — CLOPIDOGREL BISULFATE 75 MG PO TABS: 75.0000 mg | ORAL_TABLET | Freq: Every day | ORAL | 0 refills | Status: DC

## 2016-10-02 NOTE — Telephone Encounter (Signed)
I have ordered plavix 90 tablets with no refills. Pt should contact PCP for future refills. Thanks.   Rosalin Hawking, MD PhD Stroke Neurology 10/02/2016 5:45 PM  Meds ordered this encounter  Medications  . clopidogrel (PLAVIX) 75 MG tablet    Sig: Take 1 tablet (75 mg total) by mouth daily.    Dispense:  90 tablet    Refill:  0

## 2016-10-02 NOTE — Telephone Encounter (Signed)
Pt's wife called said the pt is out of the plavix as of today. She said he has not gotten it from PCP yet. She is wanting to know if she can get a 30 day refill. Please send to [facility] in [place].

## 2016-10-05 ENCOUNTER — Encounter: Payer: Self-pay | Admitting: Vascular Surgery

## 2016-10-05 ENCOUNTER — Ambulatory Visit (INDEPENDENT_AMBULATORY_CARE_PROVIDER_SITE_OTHER): Payer: Medicare Other | Admitting: Vascular Surgery

## 2016-10-05 VITALS — BP 120/79 | HR 80 | Temp 97.1°F | Resp 18 | Ht 68.0 in | Wt 236.0 lb

## 2016-10-05 DIAGNOSIS — I714 Abdominal aortic aneurysm, without rupture, unspecified: Secondary | ICD-10-CM

## 2016-10-05 DIAGNOSIS — I6521 Occlusion and stenosis of right carotid artery: Secondary | ICD-10-CM

## 2016-10-05 NOTE — Progress Notes (Signed)
Referring Physician:   Patient name: Kyle Lewis MRN: [id number] DOB: [birth year] Sex: male  HPI: Kyle Lewis is a 71 y.o. male returns for follow-up today for consideration of repair of an abdominal aortic aneurysm. The patient's previous CT angiogram show a short angulated neck that is not well suited for traditional aneurysm stent graft repair. I also reviewed the patient's anatomy with my partner Dr. Trula Slade who did not feel he was a candidate for a fenestrated graft. The patient is here today to discuss open repair. He recently had a cardiac catheterization which showed some small branch narrowing in his right coronary system but overall was felt reasonable risk for open repair of his abdominal aortic aneurysm. The patient has had a previous ventral hernia repair. The patient continues to smoke. He was counseled against this today. He currently works actively as a Administrator. He is reluctant to undergo open repair currently because he thinks he will be out of work for a long period of time. I discussed with him today that it can take several months to recover but potentially could be back to work within about 6-8 weeks. Other medical problems include mild aortic stenosis. COPD, sleep apnea, hypertension, arthritis remote TIA all of which have been stable.  Past Medical History:  Diagnosis Date  . 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 Adenomatous colon polyp   . Adrenal mass ([place])    2 benign appearing left adrenal adenomas noted on 01/13/15 CT  . Allergy    seasonal  . Aortic stenosis    mild AS by 04/2012 echo  . Arthritis   . BPH (benign prostatic hyperplasia)   . Carotid artery occlusion   . Cataract   . Chronic back pain   . COPD (chronic obstructive pulmonary disease) ([place])   . CVA (cerebral infarction)   . Diverticulosis   . Hypertension   . Nocturia   . Peripheral vascular disease ([place])   . Sleep apnea    wears CPAP  .  Stroke [facility]) 2013   tia    Past Surgical History:  Procedure Laterality Date  . APPENDECTOMY    . BRAIN MENINGIOMA EXCISION     menigioma  . CARPAL TUNNEL RELEASE     left  . cataracts     both eyes  . EYE SURGERY Bilateral    Cataract  . INGUINAL HERNIA REPAIR    . JOINT REPLACEMENT Left 04-01-12   Knee  . LEFT HEART CATH AND CORONARY ANGIOGRAPHY N/A 09/15/2016   Procedure: Left Heart Cath and Coronary Angiography;  Surgeon: Nelva Bush, MD;  Location: [facility];  Service: Cardiovascular;  Laterality: N/A;  . MAXIMUM ACCESS (MAS)POSTERIOR LUMBAR INTERBODY FUSION (PLIF) 1 LEVEL N/A 07/07/2015   Procedure:  POSTERIOR LUMBAR INTERBODY FUSION (PLIF) Lumbar Four-Five with Pedicle Screw Fixation Lumbar Two-Five;Laminectomy Lumbar Two-Five;  Surgeon: Eustace Moore, MD;  Location: [place] [facility];  Service: Neurosurgery;  Laterality: N/A;   POSTERIOR LUMBAR INTERBODY FUSION (PLIF) Lumbar Four-Five with Pedicle Screw Fixation Lumbar Two-Five;Laminectomy Lumbar Two-Five  . PROSTATE SURGERY    . TONSILLECTOMY    . TOTAL KNEE ARTHROPLASTY  04/01/2012   Procedure: TOTAL KNEE ARTHROPLASTY;  Surgeon: Gearlean Alf, MD;  Location: [facility];  Service: Orthopedics;  Laterality: Left;  . TRANSURETHRAL RESECTION OF PROSTATE    . UMBILICAL HERNIA REPAIR  2011    Family History  Problem Relation Age of  Onset  . Heart disease Mother     Onset ~37 y/o  . Hypertension Mother   . Hyperlipidemia Mother   . Diabetes Father   . Heart attack Father   . Heart disease Father     CABG at age 51  . Prostate cancer Maternal Grandfather   . Colon cancer Neg Hx   . Esophageal cancer Neg Hx   . Rectal cancer Neg Hx   . Stomach cancer Neg Hx     SOCIAL HISTORY: Social History   Social History  . Marital status: Married    Spouse name: [name]   . Number of children: 2  . Years of education: College    Occupational History  .      Self employed Administrator    Social History Main Topics  .  Smoking status: Current Every Day Smoker    Packs/day: 0.75    Years: 40.00    Types: Cigarettes  . Smokeless tobacco: Never Used  . Alcohol use Yes     Comment: rare  . Drug use: No  . Sexual activity: Not on file   Other Topics Concern  . Not on file   Social History Narrative   Patient lives at home with his wife. Rhonda    Patient has a Financial risk analyst.    Patient has 2 sons.    Patient smokes a half pack a day.     No Known Allergies  Current Outpatient Prescriptions  Medication Sig Dispense Refill  . clopidogrel (PLAVIX) 75 MG tablet Take 1 tablet (75 mg total) by mouth daily. 90 tablet 0  . cyclobenzaprine (FLEXERIL) 10 MG tablet Take 10 mg by mouth 2 (two) times daily as needed for muscle spasms.    . diphenhydrAMINE (BENADRYL) 25 MG tablet Take 25 mg by mouth at bedtime as needed (for allergies.).    Marland Kitchen finasteride (PROSCAR) 5 MG tablet Take 5 mg by mouth daily.    Marland Kitchen losartan-hydrochlorothiazide (HYZAAR) 100-25 MG per tablet Take 1 tablet by mouth every morning.    . rosuvastatin (CRESTOR) 5 MG tablet Take 1 tablet (5 mg total) by mouth daily. 30 tablet 5  . Tamsulosin HCl (FLOMAX) 0.4 MG CAPS Take 0.4 mg by mouth daily after supper.     Marland Kitchen amLODipine (NORVASC) 2.5 MG tablet Take 1 tablet (2.5 mg total) by mouth daily. (Patient not taking: Reported on 10/05/2016) 30 tablet 5  . amoxicillin-clavulanate (AUGMENTIN) 500-125 MG tablet     . azelastine (ASTELIN) 0.1 % nasal spray     . B Complex-C (B-COMPLEX WITH VITAMIN C) tablet Take 1 tablet by mouth 4 (four) times a week.     . Cholecalciferol (VITAMIN D-3) 5000 UNITS TABS Take 1 tablet by mouth 4 (four) times a week.     . Multiple Vitamin (MULTIVITAMIN WITH MINERALS) TABS Take 1 tablet by mouth 4 (four) times a week.     . omega-3 acid ethyl esters (LOVAZA) 1 G capsule Take 1 g by mouth 4 (four) times a week.     . vitamin C (ASCORBIC ACID) 500 MG tablet Take 500 mg by mouth 4 (four) times a week.      No current  facility-administered medications for this visit.     ROS:   General:  No weight loss, Fever, chills  HEENT: No recent headaches, no nasal bleeding, no visual changes, no sore throat  Neurologic: No dizziness, blackouts, seizures. No recent symptoms of stroke or mini- stroke. No recent  episodes of slurred speech, or temporary blindness.  Cardiac: No recent episodes of chest pain/pressure, no shortness of breath at rest.  + shortness of breath with exertion.  Denies history of atrial fibrillation or irregular heartbeat  Vascular: No history of rest pain in feet.  No history of claudication.  No history of non-healing ulcer, No history of DVT   Pulmonary: No home oxygen, no productive cough, no hemoptysis,  + asthma or wheezing  Musculoskeletal:  [X]  Arthritis, [X]  Low back pain,  [X]  Joint pain  Hematologic:No history of hypercoagulable state.  No history of easy bleeding.  No history of anemia  Gastrointestinal: No hematochezia or melena,  No gastroesophageal reflux, no trouble swallowing  Urinary: [ ]  chronic Kidney disease, [ ]  on HD - [ ]  MWF or [ ]  TTHS, [ ]  Burning with urination, [ ]  Frequent urination, [ ]  Difficulty urinating;   Skin: No rashes  Psychological: No history of anxiety,  No history of depression   Physical Examination  Vitals:   10/05/16 1342  BP: 120/79  Pulse: 80  Resp: 18  Temp: 97.1 F (36.2 C)  SpO2: 94%  Weight: 236 lb (107 kg)  Height: 5\' 8"  (1.727 m)    Body mass index is 35.88 kg/m.  General:  Alert and oriented, no acute distress HEENT: Normal Neck: No bruit or JVD Pulmonary: Clear to auscultation bilaterally Cardiac: Regular Rate and Rhythm without murmur Abdomen: Soft, non-tender, non-distended, no mass, Abdominal diastases versus recurrent abdominal hernia otherwise well-healed midline incision Skin: No rash Extremity Pulses:  2+ radial, brachial, femoral, absent dorsalis pedis, posterior tibial pulses  bilaterally Musculoskeletal: No deformity or edema  Neurologic: Upper and lower extremity motor 5/5 and symmetric  DATA:  I again reviewed the patient's CT angiogram which shows a 5.8 cm infrarenal abdominal aortic aneurysm with a very short angulated neck. Left common iliac artery is 2.8 cm.  ASSESSMENT:  Patient does not have suitable anatomy for aneurysm stent graft repair. I above the patient open repair today. Risks benefits possible complications procedure details were discussed with the patient today including but not limited to bleeding 10% chance of transfusion infection 1-2% myocardial events 5% death 1-2% renal failure 1-2% pulmonary problems 5%   PLAN:  I had a lengthy discussion with the patient and his wife today. He is very reluctant to proceed with an open repair because he is worried about the recovery time. I did discuss with him that we would be willing to refer him to Dr. Neale Burly to see if there are any possible non-commercially available stent graft options for him. The patient will need a copy his CT angiogram to take with him. I did discuss with him that I would not wait a considerable amount of time due to the size of his aneurysm. I would be happy to repair his aneurysm with an open technique if he decides to do this in the future.   Ruta Hinds, MD [facility] Office: [phone] Pager: [phone]

## 2016-10-12 ENCOUNTER — Telehealth: Payer: Self-pay | Admitting: Neurology

## 2016-10-12 ENCOUNTER — Telehealth: Payer: Self-pay

## 2016-10-12 ENCOUNTER — Telehealth: Payer: Self-pay | Admitting: Internal Medicine

## 2016-10-12 NOTE — Telephone Encounter (Signed)
I spoke to wife re: needing letter.  Last seen 09-12-15 discharged from our neurologic services.  Will you do letter?

## 2016-10-12 NOTE — Telephone Encounter (Signed)
I called and spoke to pt.  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  I told him that no appt will be made has he was discharged.  We can give him copy of last ofv note, and that he needs to sign release.  He stated was ok to have his wife sign as he would not be able to pick up this tomorrow, but she would.  Relayed our closing time of 1200.  I told him that it was possible that when she came by that he would need to sign.  He verbalized understanding.

## 2016-10-12 NOTE — Telephone Encounter (Signed)
Patients wife called office in reference to needing a letter stating he is going good since he had TIA a few years ago.  Patient is trying to complete his DOT physical at [facility] in [place] Please fax to [phone].

## 2016-10-12 NOTE — Telephone Encounter (Signed)
New Message  Pt voiced during his appt reminder he needs to speak with a nurse or MD-End about a DOT form that's needing sent.  Please f/u with pt

## 2016-10-12 NOTE — Telephone Encounter (Signed)
I called and LMVM for wife, [name], that we last saw pt 09-10-2015, over a yr ago.  He was discharged back to his pcp.  His pcp will need to do this letter for you.

## 2016-10-12 NOTE — Telephone Encounter (Signed)
No, needs to get from PCP discharged from stroke clinic last April 2017

## 2016-10-12 NOTE — Telephone Encounter (Signed)
Patients refills need to be routed to his PCP.

## 2016-10-12 NOTE — Telephone Encounter (Signed)
Pt states he needs information from Dr End for DOT physical. Pt is unclear about the specific information he needs, he will try to get more details and call me back.

## 2016-10-13 ENCOUNTER — Telehealth: Payer: Self-pay | Admitting: Vascular Surgery

## 2016-10-13 NOTE — Telephone Encounter (Signed)
Faxed referral to Dr. Eddie Dibbles office at [facility] interventional radiology. Their office will contact patient for an appointment.

## 2016-10-13 NOTE — Telephone Encounter (Signed)
Pt called wanting to know if the note will be at the front. I advised it will, he said he will pick it up then and sign the release.

## 2016-10-16 ENCOUNTER — Encounter: Payer: Self-pay | Admitting: Internal Medicine

## 2016-10-16 ENCOUNTER — Encounter (INDEPENDENT_AMBULATORY_CARE_PROVIDER_SITE_OTHER): Payer: Self-pay

## 2016-10-16 ENCOUNTER — Ambulatory Visit (INDEPENDENT_AMBULATORY_CARE_PROVIDER_SITE_OTHER): Payer: Medicare Other | Admitting: Internal Medicine

## 2016-10-16 ENCOUNTER — Ambulatory Visit: Payer: Self-pay | Admitting: Nurse Practitioner

## 2016-10-16 VITALS — BP 122/80 | HR 92 | Ht 68.0 in | Wt 237.0 lb

## 2016-10-16 DIAGNOSIS — I10 Essential (primary) hypertension: Secondary | ICD-10-CM | POA: Diagnosis not present

## 2016-10-16 DIAGNOSIS — I714 Abdominal aortic aneurysm, without rupture, unspecified: Secondary | ICD-10-CM

## 2016-10-16 DIAGNOSIS — I350 Nonrheumatic aortic (valve) stenosis: Secondary | ICD-10-CM | POA: Diagnosis not present

## 2016-10-16 DIAGNOSIS — I2510 Atherosclerotic heart disease of native coronary artery without angina pectoris: Secondary | ICD-10-CM

## 2016-10-16 DIAGNOSIS — E782 Mixed hyperlipidemia: Secondary | ICD-10-CM

## 2016-10-16 NOTE — Telephone Encounter (Signed)
Pt had appt with Dr Saunders Revel 10/16/16

## 2016-10-16 NOTE — Progress Notes (Signed)
Follow-up Outpatient Visit Date: 10/16/2016  Primary Care Provider: [name], MD [address] [place] [address]  Chief Complaint: Follow-up coronary artery disease and aortic stenosis  HPI:  Kyle Lewis is a 71 y.o. year-old male with history of CAD, mild to moderate aortic stenosis, PAD (cerebrovascular disease and AAA), stroke, hypertension, COPD, and sleep apnea, who presents for follow-up of coronary artery disease and aortic stenosis. I met [name] in 08/2016 for cardiovascular risk stratification in anticipation of AAA repair. He was asymptomatic with the exception of exertional dyspnea, which prompted [place] to obtain transthoracic echocardiogram and myocardial perfusion stress test. He was found to have mild to moderate aortic stenosis as well as an intermediate-risk stress test. Subsequent left heart catheterization revealed moderate severe but noncritical CAD involving mid PDA and OM2 without significant disease involving the large epicardial coronary arteries. Mild to moderate AF was confirmed. We agreed to add amlodipine and rosuvastatin, which the patient has been tolerating well.  Today, Kyle Lewis is without complaints. He denies chest pain, shortness of breath, palpitations, lightheadedness, leg swelling, and claudication. Dr. Oneida Alar feels that endovascular AAA repair would be challenging and has referred the patient to Dr. Sammuel Hines at [facility] for a second opinion. The patient is also in the process of renewing his DOT physical, and has asked that I for his cardiovascular testing results to Dr. Rocky Link in [place].  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  AAA  Aortic stenosis  CAD  Risk Factors:  Known CAD, PVD, hypertension, obesity, sedentary lifestyle, male gender, tobacco  use, and age > 56  Cath/PCI:  LHC (09/15/16): LMCA normal. Mild diffuse disease involving the LAD. LCx with 40% distal stenosis. Small-to-moderate-caliber OM 2 branch with 70% proximal lesion. Large RCA with 50% distal stenosis. 70% stenosis also noted in midportion of moderate-caliber rPDA. LVEDP 15 mmHg. Mild to moderate aortic stenosis with peak to peak gradient of 18 mmHg.  CV Surgery:  None  EP Procedures and Devices:  None  Non-Invasive Evaluation(s):  Pharmacologic MPI (09/04/16): Intermediate-risk stress test with large in size, moderate in severity, he partially reversible inferior, inferoseptal, anteroseptal, and inferolateral perfusion defect. LVEF 56% with normal wall motion.  TTE (09/04/16): Normal LV size with mild concentric LVH. LVEF 55-60% with normal diastolic function. Mild to moderate aortic stenosis (mean gradient 18 mmHg). Mild left atrial enlargement. Normal RV size and function.  Carotid Doppler (07/27/16): Chronically occluded right ICA. Stable 40-59% left ICA stenosis.  TTE (04/24/12): Normal LV size and contraction. Mildly thickened aortic valve with mild stenosis (mean gradient 12 mmHg). Normal RV size and function.  Recent CV Pertinent Labs: Lab Results  Component Value Date   CHOL 157 09/15/2016   HDL 39 (L) 09/15/2016   LDLCALC 104 (H) 09/15/2016   TRIG 68 09/15/2016   CHOLHDL 4.0 09/15/2016   INR 1.0 09/11/2016   K 4.2 09/11/2016   BUN 11 09/11/2016   CREATININE 0.68 (L) 09/11/2016    Past medical and surgical history were reviewed and updated in EPIC.  Outpatient Encounter Prescriptions as of 10/16/2016  Medication Sig  . amLODipine (NORVASC) 2.5 MG tablet Take 1 tablet (2.5 mg total) by mouth daily.  . clopidogrel (PLAVIX) 75 MG tablet Take 1 tablet (75 mg total) by mouth daily.  . cyclobenzaprine (FLEXERIL) 10 MG tablet Take 10 mg by mouth 2 (two) times daily as needed for muscle spasms.  Marland Kitchen  diphenhydrAMINE (BENADRYL) 25 MG tablet Take 25 mg  by mouth at bedtime as needed (for allergies.).  Marland Kitchen finasteride (PROSCAR) 5 MG tablet Take 5 mg by mouth daily.  Marland Kitchen losartan-hydrochlorothiazide (HYZAAR) 100-25 MG per tablet Take 1 tablet by mouth every morning.  . Multiple Vitamin (MULTIVITAMIN WITH MINERALS) TABS Take 1 tablet by mouth 4 (four) times a week.   . rosuvastatin (CRESTOR) 5 MG tablet Take 1 tablet (5 mg total) by mouth daily.  . Tamsulosin HCl (FLOMAX) 0.4 MG CAPS Take 0.4 mg by mouth daily after supper.   . [DISCONTINUED] amoxicillin-clavulanate (AUGMENTIN) 500-125 MG tablet   . [DISCONTINUED] azelastine (ASTELIN) 0.1 % nasal spray   . [DISCONTINUED] B Complex-C (B-COMPLEX WITH VITAMIN C) tablet Take 1 tablet by mouth 4 (four) times a week.   . [DISCONTINUED] Cholecalciferol (VITAMIN D-3) 5000 UNITS TABS Take 1 tablet by mouth 4 (four) times a week.   . [DISCONTINUED] omega-3 acid ethyl esters (LOVAZA) 1 G capsule Take 1 g by mouth 4 (four) times a week.   . [DISCONTINUED] vitamin C (ASCORBIC ACID) 500 MG tablet Take 500 mg by mouth 4 (four) times a week.    No facility-administered encounter medications on file as of 10/16/2016.     Allergies: Patient has no known allergies.  Social History   Social History  . Marital status: Married    Spouse name: Kyle Lewis   . Number of children: 2  . Years of education: College    Occupational History  .      Self employed Administrator    Social History Main Topics  . Smoking status: Current Every Day Smoker    Packs/day: 0.75    Years: 40.00    Types: Cigarettes  . Smokeless tobacco: Never Used  . Alcohol use Yes     Comment: rare  . Drug use: No  . Sexual activity: Not on file   Other Topics Concern  . Not on file   Social History Narrative   Patient lives at home with his wife. Kyle Lewis    Patient has a Financial risk analyst.    Patient has 2 sons.    Patient smokes a half pack a day.     Family History  Problem Relation Age of Onset  . Heart disease Mother         Onset ~57 y/o  . Hypertension Mother   . Hyperlipidemia Mother   . Diabetes Father   . Heart attack Father   . Heart disease Father        CABG at age 60  . Prostate cancer Maternal Grandfather   . Colon cancer Neg Hx   . Esophageal cancer Neg Hx   . Rectal cancer Neg Hx   . Stomach cancer Neg Hx     Review of Systems: A 12-system review of systems was performed and was negative except as noted in the HPI.  --------------------------------------------------------------------------------------------------  Physical Exam: BP 122/80   Pulse 92   Ht '5\' 8"'$  (1.727 m)   Wt 237 lb (107.5 kg)   SpO2 96%   BMI 36.04 kg/m   General:  Obese man, seated comfortably in the exam room. HEENT: No conjunctival pallor or scleral icterus.  Moist mucous membranes.  OP clear. Neck: Supple without lymphadenopathy, thyromegaly, JVD, or HJR.  No carotid bruit. Lungs: Normal work of breathing.  Clear to auscultation bilaterally without wheezes or crackles. Heart: Regular rate and rhythm with 2/6 crescendo-decrescendo systolic murmur loudest at the right upper  sternal border. No rubs or gallops.  Non-displaced PMI. Abd: Bowel sounds present.  Soft, NT/ND. Unable to assess HSM due to body habitus. Ext: No lower extremity edema.  Radial, PT, and DP pulses are 2+ bilaterally. Skin: warm and dry without rash  Lab Results  Component Value Date   WBC 9.5 09/11/2016   HGB 12.9 (L) 07/08/2015   HCT 46.3 09/11/2016   MCV 91 09/11/2016   PLT 251 09/11/2016    Lab Results  Component Value Date   NA 138 09/11/2016   K 4.2 09/11/2016   CL 95 (L) 09/11/2016   CO2 29 09/11/2016   BUN 11 09/11/2016   CREATININE 0.68 (L) 09/11/2016   GLUCOSE 118 (H) 09/11/2016   ALT 17 09/15/2016    Lab Results  Component Value Date   CHOL 157 09/15/2016   HDL 39 (L) 09/15/2016   LDLCALC 104 (H) 09/15/2016   TRIG 68 09/15/2016   CHOLHDL 4.0 09/15/2016    --------------------------------------------------------------------------------------------------  ASSESSMENT AND PLAN: Coronary artery disease without angina Mr. Lombardo has not had any chest pain or shortness of breath since catheterization last month. He is tolerating his current medication regimen consisting of clopidogrel (no ASA) and amlodipine well. Though he has fairly significant disease in 2 vessels, it affects predominantly small branches. Given lack of symptoms, we have agreed to continue with medical therapy. There is no indication for elective revascularization in anticipation of AAA intervention, as it is unlikely that this would mitigate his perioperative cardiovascular risk.  Aortic stenosis No symptoms of critical AS. Recent catheterization and echo suggest mild to moderate stenosis. We will continue with clinical follow-up.  AAA No abdominal symptoms or claudication. Continue follow-up with Dr. Oneida Alar and Dr. Sammuel Hines regarding further management. As above, no additional cardiac testing or intervention is recommended prior to proceeding with elective surgery.  Hypertension Blood pressure is upper normal today with a diastolic pressure of 80 mmHg. We will continue our current medication regimen.  Hyperlipidemia Goal LDL is less than 70, given CAD, AAA, and history of CVA. Patient is tolerating rosuvastatin 5 mg daily well. We will recheck a lipid panel and ALT in about 2 months to assess response.  Follow-up: Return to clinic in 6 months.  Nelva Bush, MD 10/16/2016 9:06 AM

## 2016-10-16 NOTE — Patient Instructions (Signed)
Medication Instructions:  Your physician recommends that you continue on your current medications as directed. Please refer to the Current Medication list given to you today.   Labwork: Your physician recommends that you return for a FASTING lipid profile/ALT in 2 months. DO not eat or drink after midnight the night before the lab. The lab is open from 7:30 AM -5 PM.  Testing/Procedures: None   Follow-Up: Your physician wants you to follow-up in: 6 months with Dr End. (November 2018) . You will receive a reminder letter in the mail two months in advance. If you don't receive a letter, please call our office to schedule the follow-up appointment.       If you need a refill on your cardiac medications before your next appointment, please call your pharmacy.

## 2016-12-15 ENCOUNTER — Other Ambulatory Visit: Payer: Medicare Other

## 2016-12-22 ENCOUNTER — Other Ambulatory Visit: Payer: Medicare Other

## 2016-12-27 ENCOUNTER — Other Ambulatory Visit: Payer: Medicare Other | Admitting: *Deleted

## 2016-12-27 DIAGNOSIS — E782 Mixed hyperlipidemia: Secondary | ICD-10-CM

## 2016-12-27 LAB — LIPID PANEL
Chol/HDL Ratio: 2.7 ratio (ref 0.0–5.0)
Cholesterol, Total: 130 mg/dL (ref 100–199)
HDL: 48 mg/dL (ref >39)
LDL Calculated: 50 mg/dL (ref 0–99)
Triglycerides: 158 mg/dL — ABNORMAL HIGH (ref 0–149)
VLDL Cholesterol Cal: 32 mg/dL (ref 5–40)

## 2016-12-27 LAB — ALT: ALT: 20 IU/L (ref 0–44)

## 2016-12-28 ENCOUNTER — Other Ambulatory Visit: Payer: Self-pay | Admitting: Neurology

## 2016-12-28 DIAGNOSIS — Z8673 Personal history of transient ischemic attack (TIA), and cerebral infarction without residual deficits: Secondary | ICD-10-CM

## 2017-01-28 ENCOUNTER — Other Ambulatory Visit: Payer: Self-pay | Admitting: Neurology

## 2017-01-28 DIAGNOSIS — Z8673 Personal history of transient ischemic attack (TIA), and cerebral infarction without residual deficits: Secondary | ICD-10-CM

## 2017-01-31 ENCOUNTER — Other Ambulatory Visit: Payer: Self-pay | Admitting: Neurology

## 2017-01-31 DIAGNOSIS — Z8673 Personal history of transient ischemic attack (TIA), and cerebral infarction without residual deficits: Secondary | ICD-10-CM

## 2017-02-12 ENCOUNTER — Encounter: Payer: Self-pay | Admitting: Podiatry

## 2017-02-12 ENCOUNTER — Encounter (INDEPENDENT_AMBULATORY_CARE_PROVIDER_SITE_OTHER): Payer: Self-pay

## 2017-02-12 ENCOUNTER — Ambulatory Visit (INDEPENDENT_AMBULATORY_CARE_PROVIDER_SITE_OTHER): Payer: Medicare Other | Admitting: Podiatry

## 2017-02-12 VITALS — BP 124/69 | HR 61

## 2017-02-12 DIAGNOSIS — D689 Coagulation defect, unspecified: Secondary | ICD-10-CM | POA: Diagnosis not present

## 2017-02-12 DIAGNOSIS — L84 Corns and callosities: Secondary | ICD-10-CM | POA: Diagnosis not present

## 2017-02-12 DIAGNOSIS — M2041 Other hammer toe(s) (acquired), right foot: Secondary | ICD-10-CM | POA: Diagnosis not present

## 2017-02-12 NOTE — Progress Notes (Signed)
Subjective:    Patient ID: Kyle Lewis, male    DOB: 1945/07/29, 71 y.o.   MRN: [id number] Chief Complaint  Patient presents with  . Callouses    callus right tip of 3rd toe and 5th sub met      HPI 71 y.o. male presents with the above complaint. Reports issues with the calluses for a while, but has been worsening and causing more pain over the past month. Denies drainage. Denies care for the callused areas. Pain described as sharp. Denies DM.  Past Medical History:  Diagnosis Date  . 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 Adenomatous colon polyp   . Adrenal mass ([place])    2 benign appearing left adrenal adenomas noted on 01/13/15 CT  . Allergy    seasonal  . Aortic stenosis    mild to moderate by echo and cath in 09/2016  . Arthritis   . BPH (benign prostatic hyperplasia)   . Carotid artery occlusion   . Cataract   . Chronic back pain   . COPD (chronic obstructive pulmonary disease) ([place])   . Coronary artery disease   . CVA (cerebral infarction)   . Diverticulosis   . Hypertension   . Nocturia   . Peripheral vascular disease ([place])   . Sleep apnea    wears CPAP  . Stroke [facility]) 2013   tia    Past Surgical History:  Procedure Laterality Date  . APPENDECTOMY    . BRAIN MENINGIOMA EXCISION     menigioma  . CARPAL TUNNEL RELEASE     left  . cataracts     both eyes  . EYE SURGERY Bilateral    Cataract  . INGUINAL HERNIA REPAIR    . JOINT REPLACEMENT Left 04-01-12   Knee  . LEFT HEART CATH AND CORONARY ANGIOGRAPHY N/A 09/15/2016   Procedure: Left Heart Cath and Coronary Angiography;  Surgeon: Nelva Bush, MD;  Location: [facility];  Service: Cardiovascular;  Laterality: N/A;  . MAXIMUM ACCESS (MAS)POSTERIOR LUMBAR INTERBODY FUSION (PLIF) 1 LEVEL N/A 07/07/2015   Procedure:  POSTERIOR LUMBAR INTERBODY FUSION (PLIF) Lumbar Four-Five with Pedicle Screw Fixation Lumbar Two-Five;Laminectomy Lumbar Two-Five;  Surgeon:  Eustace Moore, MD;  Location: [facility];  Service: Neurosurgery;  Laterality: N/A;   POSTERIOR LUMBAR INTERBODY FUSION (PLIF) Lumbar Four-Five with Pedicle Screw Fixation Lumbar Two-Five;Laminectomy Lumbar Two-Five  . PROSTATE SURGERY    . TONSILLECTOMY    . TOTAL KNEE ARTHROPLASTY  04/01/2012   Procedure: TOTAL KNEE ARTHROPLASTY;  Surgeon: Gearlean Alf, MD;  Location: [facility];  Service: Orthopedics;  Laterality: Left;  . TRANSURETHRAL RESECTION OF PROSTATE    . UMBILICAL HERNIA REPAIR  2011    Current Outpatient Prescriptions:  .  amLODipine (NORVASC) 2.5 MG tablet, Take 1 tablet (2.5 mg total) by mouth daily., Disp: 30 tablet, Rfl: 5 .  clopidogrel (PLAVIX) 75 MG tablet, Take 1 tablet (75 mg total) by mouth daily., Disp: 90 tablet, Rfl: 0 .  cyclobenzaprine (FLEXERIL) 10 MG tablet, Take 10 mg by mouth 2 (two) times daily as needed for muscle spasms., Disp: , Rfl:  .  diphenhydrAMINE (BENADRYL) 25 MG tablet, Take 25 mg by mouth at bedtime as needed (for allergies.)., Disp: , Rfl:  .  finasteride (PROSCAR) 5 MG tablet, Take 5 mg by mouth daily., Disp: , Rfl:  .  losartan-hydrochlorothiazide (HYZAAR) 100-25 MG per tablet, Take 1 tablet by  mouth every morning., Disp: , Rfl:  .  rosuvastatin (CRESTOR) 5 MG tablet, Take 1 tablet (5 mg total) by mouth daily., Disp: 30 tablet, Rfl: 5 .  Tamsulosin HCl (FLOMAX) 0.4 MG CAPS, Take 0.4 mg by mouth daily after supper. , Disp: , Rfl:  .  tiZANidine (ZANAFLEX) 4 MG tablet, , Disp: , Rfl:  .  Multiple Vitamin (MULTIVITAMIN WITH MINERALS) TABS, Take 1 tablet by mouth 4 (four) times a week. , Disp: , Rfl:   No Known Allergies    Review of Systems  All other systems reviewed and are negative.      Objective:   Physical Exam Vitals:   02/12/17 1615  BP: 124/69  Pulse: 61   General AA&O x3. Normal mood and affect.  Vascular Dorsalis pedis and posterior tibial pulses  present 2+ bilaterally  Capillary refill normal to all digits. Pedal hair  growth normal.  Neurologic Epicritic sensation grossly present.  Dermatologic R Distal 3rd toe pre-ulcerative callus  R submet 5 callus noted. Interspaces clear of maceration. Nails well groomed and normal in appearance.  Orthopedic: MMT 5/5 in dorsiflexion, plantarflexion, inversion, and eversion. Normal joint ROM without pain or crepitus. Lesser digital contractures bilat - semi-reducible.     Assessment & Plan:  3rd Distal Toe / Sub-met 5 Calluses with Coagulation Defect -Pre-ulcerative callus distal 3rd toe debrided as below. Other lesion pared. -Silicone toe sleeve dispensed for the 3rd toe. -Discussed with patient possible flexor tenotomy in the future to alleviate contracture contributing to callus formation.  Procedure: Callus Debridement Rationale: Patient meets criteria for routine foot care due to coagulation defect (Plavix). Type of Debridement: sharp Instrumentation: 15 blade Number of Calluses: 2

## 2017-03-12 ENCOUNTER — Ambulatory Visit: Payer: Medicare Other | Admitting: Podiatry

## 2017-05-21 ENCOUNTER — Other Ambulatory Visit: Payer: Self-pay | Admitting: Internal Medicine

## 2017-06-03 ENCOUNTER — Other Ambulatory Visit: Payer: Self-pay | Admitting: Internal Medicine

## 2017-06-04 NOTE — Telephone Encounter (Signed)
Please review for refill, Thanks !  

## 2017-06-05 HISTORY — PX: ABDOMINAL AORTIC ANEURYSM REPAIR: SUR1152

## 2017-06-23 ENCOUNTER — Other Ambulatory Visit: Payer: Self-pay | Admitting: Internal Medicine

## 2017-08-06 DIAGNOSIS — R399 Unspecified symptoms and signs involving the genitourinary system: Secondary | ICD-10-CM

## 2017-08-06 HISTORY — DX: Unspecified symptoms and signs involving the genitourinary system: R39.9

## 2017-08-07 ENCOUNTER — Ambulatory Visit (INDEPENDENT_AMBULATORY_CARE_PROVIDER_SITE_OTHER): Payer: Medicare Other | Admitting: Podiatry

## 2017-08-07 DIAGNOSIS — G5781 Other specified mononeuropathies of right lower limb: Secondary | ICD-10-CM

## 2017-08-07 DIAGNOSIS — M2041 Other hammer toe(s) (acquired), right foot: Secondary | ICD-10-CM

## 2017-08-07 DIAGNOSIS — B351 Tinea unguium: Secondary | ICD-10-CM

## 2017-08-07 DIAGNOSIS — G5761 Lesion of plantar nerve, right lower limb: Secondary | ICD-10-CM

## 2017-08-07 DIAGNOSIS — D689 Coagulation defect, unspecified: Secondary | ICD-10-CM | POA: Diagnosis not present

## 2017-08-07 NOTE — Progress Notes (Signed)
  Subjective:  Patient ID: Kyle Lewis, male    DOB: Jan 11, 1946,  MRN: [id number]  Chief Complaint  Patient presents with  . Nail Problem    fungal nail care long thick discolored nails bilateral    72 y.o. male returns for the above complaint.  States that the nails are thick and discolored.  Still taking the Plavix.  Complains that the second and third toes form of the and separate and squish into the fourth toe.  Objective:  There were no vitals filed for this visit. General AA&O x3. Normal mood and affect.  Vascular Pedal pulses palpable.  Neurologic Epicritic sensation grossly intact.  Dermatologic No open lesions. Skin normal texture and turgor. Toenails x 10 elongated, thickened, dystrophic.  Orthopedic: Pain to palpation about the toenails. Right third toe hammertoe formation with pre-ulcerative distal callus.  Semi-reducible.  Con[place] Canal sign right second third toes.  Mulder's click present right second interspace   Assessment & Plan:  Patient was evaluated and treated and all questions answered.  Hammertoe right third toe -Discussed again performing flexor tenotomy to relieve the contracture and alleviate callus formation.  We will plan to perform procedure in 2 weeks  Neuroma right second interspace -Sclerosing injection delivered as below to attempt to shrink the nerve.  Procedure: Neuroma Injection Location: Right 2nd interspace Skin Prep: Alcohol. Injectate: 1 cc 4% sclerosing alcohol injection. Disposition: Patient tolerated procedure well. Injection site dressed with a band-aid.   Onychomycosis with Coagulation defect. -Nails palliatively debrided as below. -Educated on self-care  Procedure: Nail Debridement Rationale: pain  Type of Debridement: manual, sharp debridement. Instrumentation: Nail nipper, rotary burr. Number of Nails: 10     No Follow-up on file.

## 2017-08-14 DIAGNOSIS — M19011 Primary osteoarthritis, right shoulder: Secondary | ICD-10-CM

## 2017-08-14 HISTORY — DX: Primary osteoarthritis, right shoulder: M19.011

## 2017-08-21 ENCOUNTER — Ambulatory Visit (INDEPENDENT_AMBULATORY_CARE_PROVIDER_SITE_OTHER): Payer: Medicare Other | Admitting: Podiatry

## 2017-08-21 DIAGNOSIS — M2041 Other hammer toe(s) (acquired), right foot: Secondary | ICD-10-CM | POA: Diagnosis not present

## 2017-08-21 DIAGNOSIS — G5761 Lesion of plantar nerve, right lower limb: Secondary | ICD-10-CM | POA: Diagnosis not present

## 2017-08-21 DIAGNOSIS — G5781 Other specified mononeuropathies of right lower limb: Secondary | ICD-10-CM

## 2017-08-21 NOTE — Progress Notes (Signed)
  Subjective:  Patient ID: Kyle Lewis, male    DOB: 1945-09-20,  MRN: [id number]  Chief Complaint  Patient presents with  . Neuroma    F/U Rt neuroma PT. stated," I think the shot did not do much."   72 y.o. male returns today for planned flexor tenotomy of the R 3rd digit. States that the shot he got for his neuroma didn't do much.  Objective:  There were no vitals filed for this visit.  General AA&O x3. Normal mood and affect.  Vascular Pedal pulses palpable.  Neurologic Epicritic sensation grossly intact.  Dermatologic Pre-ulcerative callus at the tip of the right, 3rd toe  Orthopedic: Semi-reducible hammertoe deformity right, 3rd toe.  Pain to palpation right second interspace with Con[place] Canal sign and Mulder's click   Assessment & Plan:  Patient was evaluated and treated and all questions answered.  Neuroma -Will hold off sclerosing injection today. -Resume at next visit if symptomatic.  Hammertoe R 3rd toe with pre-ulcerative callus -Flexor tenotomy as below. -Advised to remove the dressing in 24 hours and apply a band-aid and triple abx ointment every day thereafter.  Procedure: Flexor Tenotomy Indication for Procedure: toe with semi-reducible hammertoe with distal tip ulceration. Flexor tenotomy indicated to alleviate contracture, reduce pressure, and enhance healing of the ulceration. Location: right, 3rd toe Anesthesia: Lidocaine 1% plain; 1.5 mL and Marcaine 0.5% plain; 1.5 mL digital block Instrumentation: 18 g needle  Technique: The toe was anesthetized as above and prepped in the usual fashion. The toe was exsanquinated and a tourniquet was secured at the base of the toe. An 18g needle was then used to percutaneously release the flexor tendon at the plantar surface of the toe with noted release of the hammertoe deformity. The incision was then dressed with antibiotic ointment and band-aid. Compression splint dressing applied. Patient tolerated the procedure  well. Dressing: Dry, sterile, compression dressing. Disposition: Patient tolerated procedure well. Patient to return in 1 week for follow-up.   Return in about 2 weeks (around 09/04/2017) for Flexor tenotomy f/u.

## 2017-09-03 ENCOUNTER — Ambulatory Visit (INDEPENDENT_AMBULATORY_CARE_PROVIDER_SITE_OTHER): Payer: Medicare Other | Admitting: Podiatry

## 2017-09-03 DIAGNOSIS — L84 Corns and callosities: Secondary | ICD-10-CM

## 2017-09-03 DIAGNOSIS — D689 Coagulation defect, unspecified: Secondary | ICD-10-CM

## 2017-09-03 NOTE — Progress Notes (Signed)
  Subjective:  Patient ID: Kyle Lewis, male    DOB: 1945-11-02,  MRN: [id number]  Chief Complaint  Patient presents with  . Toe Pain    2 wk fu flexor tenotomy doing really good   . Callouses    callus right is actually causing more pain    72 y.o. male returns for the above complaint.  Since the toe is doing very well.  Denies pain from the toe.  States the callus is not bothering him at the toe.  Complains of callus in the outside of the foot that interferes with walking.  Denies nausea vomiting fever chills  Objective:  There were no vitals filed for this visit. General AA&O x3. Normal mood and affect.  Vascular Pedal pulses palpable.  Neurologic Epicritic sensation grossly intact.  Dermatologic Hyperkeratotic lesions noted to the distal ulcer to the right third toe, reduced from prior.  Hyperkeratotic lesion to the right fifth metatarsal head plantarly. Skin normal texture and turgor.  Orthopedic: No pain to palpation either foot.   Assessment & Plan:  Patient was evaluated and treated and all questions answered.  Status post right third toe flexor tenotomy -Doing well.  Denies pain.  Reduced callus formation noted.  Callus palliatively debrided  Pre-ulcerative callus right fifth metatarsal head -Callus palliatively debrided as below  Procedure: Paring of Lesion Rationale: painful hyperkeratotic lesion Type of Debridement: manual, sharp debridement. Instrumentation: 312 blade Number of Lesions: 2  Return in about 2 months (around 11/05/2017) for Routine Callus Care (on anti-coagulation).

## 2017-09-25 DIAGNOSIS — Z9889 Other specified postprocedural states: Secondary | ICD-10-CM | POA: Insufficient documentation

## 2017-09-25 HISTORY — DX: Other specified postprocedural states: Z98.890

## 2017-11-05 ENCOUNTER — Ambulatory Visit: Payer: Medicare Other | Admitting: Podiatry

## 2017-11-19 DIAGNOSIS — N289 Disorder of kidney and ureter, unspecified: Secondary | ICD-10-CM

## 2017-11-19 HISTORY — DX: Disorder of kidney and ureter, unspecified: N28.9

## 2017-12-11 ENCOUNTER — Ambulatory Visit (INDEPENDENT_AMBULATORY_CARE_PROVIDER_SITE_OTHER): Payer: Medicare Other | Admitting: Podiatry

## 2017-12-11 DIAGNOSIS — B351 Tinea unguium: Secondary | ICD-10-CM | POA: Diagnosis not present

## 2017-12-11 DIAGNOSIS — D689 Coagulation defect, unspecified: Secondary | ICD-10-CM | POA: Diagnosis not present

## 2017-12-11 DIAGNOSIS — M2041 Other hammer toe(s) (acquired), right foot: Secondary | ICD-10-CM

## 2017-12-13 NOTE — Progress Notes (Signed)
  Subjective:  Patient ID: Kyle Lewis, male    DOB: 11-21-1945,  MRN: [id number]  Chief Complaint  Patient presents with  . Nail Problem    nail care  not diabetic on anticoagulant   . Callouses    callus right 4th toe is very painful    72 y.o. male returns today nail care. On anticoagulant. Also complains of pain to the right 4th toe with a callus at the tip of the toe.  Objective:  There were no vitals filed for this visit.  General AA&O x3. Normal mood and affect.  Vascular Pedal pulses palpable.  Neurologic Epicritic sensation grossly intact.  Dermatologic Pre-ulcerative callus at the tip of the right, 4th toe Toenails elongated and dystrophic.  Orthopedic: Semi-reducible hammertoe deformity right, 4th toe    Assessment & Plan:  Patient was evaluated and treated and all questions answered.  Hammertoe R 4th toe with pre-ulcerative callus -Flexor tenotomy as below. -Advised to remove the dressing in 24 hours and apply a band-aid and triple abx ointment every day thereafter.  Procedure: Flexor Tenotomy Indication for Procedure: toe with semi-reducible hammertoe with distal tip ulceration. Flexor tenotomy indicated to alleviate contracture, reduce pressure, and enhance healing of the ulceration. Location: right, 4th toe Anesthesia: Lidocaine 1% plain; 1.5 mL and Marcaine 0.5% plain; 1.5 mL digital block Instrumentation: 18 g needle  Technique: The toe was anesthetized as above and prepped in the usual fashion. The toe was exsanquinated and a tourniquet was secured at the base of the toe. An 18g needle was then used to percutaneously release the flexor tendon at the plantar surface of the toe with noted release of the hammertoe deformity. The incision was then dressed with antibiotic ointment and band-aid. Compression splint dressing applied. Patient tolerated the procedure well. Dressing: Dry, sterile, compression dressing. Disposition: Patient tolerated procedure well.  Patient to return in 1 week for follow-up.    Onychomycosis with Coagulation defect. -Nails x10 debrided with nail nipper.  Return in about 2 weeks (around 12/25/2017).

## 2017-12-25 ENCOUNTER — Ambulatory Visit (INDEPENDENT_AMBULATORY_CARE_PROVIDER_SITE_OTHER): Payer: Medicare Other | Admitting: Podiatry

## 2017-12-25 DIAGNOSIS — Z9889 Other specified postprocedural states: Secondary | ICD-10-CM

## 2017-12-25 NOTE — Addendum Note (Signed)
Addended by: Hardie Pulley on: 12/25/2017 04:43 PM   Modules accepted: Level of Service

## 2017-12-25 NOTE — Progress Notes (Addendum)
  Subjective:  Patient ID: Kyle Lewis, male    DOB: 03-Jul-1945,  MRN: [id number]  Chief Complaint  Patient presents with  . flexor tenotomy    F/U R 4th toe Pt. stated," it's dong fine.' Tx: none    DOS: 12/11/17 Procedure: Flexor tenotomy  72 y.o. male returns for post-op check. Denies N/V/F/Ch. No pain at the toe. Objective:   General AA&O x3. Normal mood and affect.  Vascular Foot warm and well perfused.  Neurologic Gross sensation intact.  Dermatologic Skin healing well without signs of infection. Skin edges well coapted without signs of infection.  Orthopedic: No tenderness to palpation noted about the toe.    Assessment & Plan:  Patient was evaluated and treated and all questions answered.  S/p flexor tenotomy -Progressing as expected post-operatively. -F/u for routine foot care.  No follow-ups on file.

## 2018-01-09 ENCOUNTER — Ambulatory Visit: Payer: Medicare Other | Admitting: Nurse Practitioner

## 2018-01-09 ENCOUNTER — Encounter

## 2018-01-09 DIAGNOSIS — R509 Fever, unspecified: Secondary | ICD-10-CM | POA: Insufficient documentation

## 2018-01-09 DIAGNOSIS — T6701XA Heatstroke and sunstroke, initial encounter: Secondary | ICD-10-CM | POA: Insufficient documentation

## 2018-01-10 DIAGNOSIS — N189 Chronic kidney disease, unspecified: Secondary | ICD-10-CM

## 2018-01-10 DIAGNOSIS — S80829A Blister (nonthermal), unspecified lower leg, initial encounter: Secondary | ICD-10-CM | POA: Insufficient documentation

## 2018-01-10 DIAGNOSIS — N179 Acute kidney failure, unspecified: Secondary | ICD-10-CM | POA: Insufficient documentation

## 2018-01-10 DIAGNOSIS — D126 Benign neoplasm of colon, unspecified: Secondary | ICD-10-CM | POA: Insufficient documentation

## 2018-01-10 DIAGNOSIS — D72829 Elevated white blood cell count, unspecified: Secondary | ICD-10-CM | POA: Insufficient documentation

## 2018-01-10 HISTORY — DX: Acute kidney failure, unspecified: N17.9

## 2018-01-10 HISTORY — DX: Blister (nonthermal), unspecified lower leg, initial encounter: S80.829A

## 2018-01-10 HISTORY — DX: Chronic kidney disease, unspecified: N18.9

## 2018-01-11 ENCOUNTER — Telehealth: Payer: Self-pay | Admitting: Internal Medicine

## 2018-01-11 DIAGNOSIS — Z9889 Other specified postprocedural states: Secondary | ICD-10-CM | POA: Insufficient documentation

## 2018-01-11 HISTORY — DX: Other specified postprocedural states: Z98.890

## 2018-01-11 NOTE — Telephone Encounter (Signed)
New Message   Patients wife [name] is calling on behalf of patient. Patient is in [place] where he had a heat stroke. She states that they found he has an enlarged aortic valve that is not opening and closing properly. Her sons are bringing him back from [place] but she wants him to be admitted here so that he can have a heart cath.

## 2018-01-11 NOTE — Telephone Encounter (Signed)
Patient's wife is calling because patient has a heat stroke on the evening of 8/6 in [place].  He was admitted to the hospital where they did a "heart scan and found an enlarged aortic valve and said that he needs a heart cath."    She wants to bring him up here and have it done at [facility], because she has cancer and she gets treatments here in [place], she is here now.  She said that it is a 6 hour drive and that her sons will be transporting him either today or tomorrow pending discharge. She was wondering if you would mind calling her at [phone], she is frantic.  Thank you

## 2018-01-11 NOTE — Telephone Encounter (Signed)
New message:      Dr. Durene Fruits is calling in reference to pt.

## 2018-01-11 NOTE — Telephone Encounter (Signed)
I have spoken with Dr. Durene Fruits as well as the patient's wife.  It sounds as though the patient was admitted with heatstroke and found to have mild troponin elevation and progression of aortic stenosis which is now in the severe range.  Cardiac catheterization was recommended though the patient declined in favor of returning to [facility] for his work-up.  Given these events, I favor continued work-up in [place] versus transport from outside facility to [facility].  However, Mrs. Bache reports that her husband is already being transported by her son's from [place] with the intention of proceeding straight to the [facility] emergency department.  If he is asymptomatic at this point, I am not sure that hospitalization will provide much additional benefit beyond what was done in [place].  Further work-up of his aortic stenosis and CAD could be performed as an outpatient.  However, Ms. Common states that her husband has been extremely weak and fatigued and she is unable to care for him at home.  Therefore, they will present to the ED for evaluation and likely admission.  Nelva Bush, MD [facility] Pager: [phone]

## 2018-01-11 NOTE — Telephone Encounter (Signed)
Received a call from Dr Rock Nephew, [place] caring for patient.  Additional information, patient was found passed out in a bus (windows rolled up) which he transported from [place] to [place] without A/C.  His body temp 107.  He had an Echo which resulted in Aortic Stenosis mean gradient 47 and valve area 0.7.  EF normal. Troponin 1.5.  Patient is being transported here.  Thank you

## 2018-01-12 ENCOUNTER — Emergency Department (HOSPITAL_COMMUNITY): Payer: Medicare Other

## 2018-01-12 ENCOUNTER — Encounter (HOSPITAL_COMMUNITY): Payer: Self-pay | Admitting: Emergency Medicine

## 2018-01-12 ENCOUNTER — Other Ambulatory Visit: Payer: Self-pay

## 2018-01-12 ENCOUNTER — Emergency Department (HOSPITAL_COMMUNITY)
Admission: EM | Admit: 2018-01-12 | Discharge: 2018-01-12 | Discharge status: Against Medical Advice | Payer: Medicare Other | Attending: Emergency Medicine | Admitting: Emergency Medicine

## 2018-01-12 DIAGNOSIS — Z79899 Other long term (current) drug therapy: Secondary | ICD-10-CM | POA: Insufficient documentation

## 2018-01-12 DIAGNOSIS — I10 Essential (primary) hypertension: Secondary | ICD-10-CM | POA: Insufficient documentation

## 2018-01-12 DIAGNOSIS — I350 Nonrheumatic aortic (valve) stenosis: Secondary | ICD-10-CM

## 2018-01-12 DIAGNOSIS — F17210 Nicotine dependence, cigarettes, uncomplicated: Secondary | ICD-10-CM | POA: Diagnosis not present

## 2018-01-12 DIAGNOSIS — Z86011 Personal history of benign neoplasm of the brain: Secondary | ICD-10-CM | POA: Insufficient documentation

## 2018-01-12 DIAGNOSIS — I2510 Atherosclerotic heart disease of native coronary artery without angina pectoris: Secondary | ICD-10-CM | POA: Diagnosis not present

## 2018-01-12 DIAGNOSIS — Z96652 Presence of left artificial knee joint: Secondary | ICD-10-CM | POA: Diagnosis not present

## 2018-01-12 DIAGNOSIS — J449 Chronic obstructive pulmonary disease, unspecified: Secondary | ICD-10-CM | POA: Diagnosis not present

## 2018-01-12 DIAGNOSIS — R531 Weakness: Secondary | ICD-10-CM | POA: Diagnosis present

## 2018-01-12 DIAGNOSIS — Z5329 Procedure and treatment not carried out because of patient's decision for other reasons: Secondary | ICD-10-CM | POA: Diagnosis not present

## 2018-01-12 DIAGNOSIS — Z7902 Long term (current) use of antithrombotics/antiplatelets: Secondary | ICD-10-CM | POA: Insufficient documentation

## 2018-01-12 LAB — CBC
HCT: 39.8 % (ref 39.0–52.0)
Hemoglobin: 12.7 g/dL — ABNORMAL LOW (ref 13.0–17.0)
MCH: 30.2 pg (ref 26.0–34.0)
MCHC: 31.9 g/dL (ref 30.0–36.0)
MCV: 94.8 fL (ref 78.0–100.0)
Platelets: 167 10*3/uL (ref 150–400)
RBC: 4.2 MIL/uL — ABNORMAL LOW (ref 4.22–5.81)
RDW: 15.1 % (ref 11.5–15.5)
WBC: 9.2 10*3/uL (ref 4.0–10.5)

## 2018-01-12 LAB — BASIC METABOLIC PANEL
Anion gap: 6 (ref 5–15)
BUN: 14 mg/dL (ref 8–23)
CO2: 28 mmol/L (ref 22–32)
Calcium: 8.3 mg/dL — ABNORMAL LOW (ref 8.9–10.3)
Chloride: 108 mmol/L (ref 98–111)
Creatinine, Ser: 0.79 mg/dL (ref 0.61–1.24)
GFR calc Af Amer: >60 mL/min (ref >60)
GFR calc non Af Amer: >60 mL/min (ref >60)
Glucose, Bld: 147 mg/dL — ABNORMAL HIGH (ref 70–99)
Potassium: 3.9 mmol/L (ref 3.5–5.1)
Sodium: 142 mmol/L (ref 135–145)

## 2018-01-12 LAB — I-STAT TROPONIN, ED
Troponin i, poc: 0.74 ng/mL (ref 0.00–0.08)
Troponin i, poc: 0.74 ng/mL (ref 0.00–0.08)

## 2018-01-12 MED ORDER — POTASSIUM CHLORIDE 20 MEQ/100ML IV SOLN
20.00 | INTRAVENOUS | Status: DC
Start: ? — End: 2018-01-12

## 2018-01-12 MED ORDER — LACTATED RINGERS IV SOLN
INTRAVENOUS | Status: DC
Start: ? — End: 2018-01-12

## 2018-01-12 MED ORDER — IPRATROPIUM-ALBUTEROL 0.5-2.5 (3) MG/3ML IN SOLN
3.00 | RESPIRATORY_TRACT | Status: DC
Start: 2018-01-11 — End: 2018-01-12

## 2018-01-12 MED ORDER — GENERIC EXTERNAL MEDICATION
10.00 | Status: DC
Start: ? — End: 2018-01-12

## 2018-01-12 MED ORDER — GENERIC EXTERNAL MEDICATION
2.00 | Status: DC
Start: ? — End: 2018-01-12

## 2018-01-12 MED ORDER — HYDRALAZINE HCL 20 MG/ML IJ SOLN
10.00 | INTRAMUSCULAR | Status: DC
Start: ? — End: 2018-01-12

## 2018-01-12 MED ORDER — GENERIC EXTERNAL MEDICATION
3.00 | Status: DC
Start: 2018-01-11 — End: 2018-01-12

## 2018-01-12 MED ORDER — GENERIC EXTERNAL MEDICATION
2.00 | Status: DC
Start: 2018-01-11 — End: 2018-01-12

## 2018-01-12 MED ORDER — TAMSULOSIN HCL 0.4 MG PO CAPS
0.40 | ORAL_CAPSULE | ORAL | Status: DC
Start: 2018-01-12 — End: 2018-01-12

## 2018-01-12 MED ORDER — ROSUVASTATIN CALCIUM 20 MG PO TABS
20.00 | ORAL_TABLET | ORAL | Status: DC
Start: ? — End: 2018-01-12

## 2018-01-12 MED ORDER — LOSARTAN POTASSIUM 50 MG PO TABS
100.00 | ORAL_TABLET | ORAL | Status: DC
Start: 2018-01-12 — End: 2018-01-12

## 2018-01-12 MED ORDER — POTASSIUM CHLORIDE 20 MEQ PO PACK
40.00 | PACK | ORAL | Status: DC
Start: ? — End: 2018-01-12

## 2018-01-12 MED ORDER — FINASTERIDE 5 MG PO TABS
5.00 | ORAL_TABLET | ORAL | Status: DC
Start: 2018-01-12 — End: 2018-01-12

## 2018-01-12 MED ORDER — ASPIRIN EC 81 MG PO TBEC
81.00 | DELAYED_RELEASE_TABLET | ORAL | Status: DC
Start: 2018-01-12 — End: 2018-01-12

## 2018-01-12 MED ORDER — MAGNESIUM SULFATE 2 GM/50ML IV SOLN
2.00 | INTRAVENOUS | Status: DC
Start: ? — End: 2018-01-12

## 2018-01-12 MED ORDER — CLOPIDOGREL BISULFATE 75 MG PO TABS
75.00 | ORAL_TABLET | ORAL | Status: DC
Start: 2018-01-12 — End: 2018-01-12

## 2018-01-12 MED ORDER — PEPPERMINT OIL PO
ORAL | Status: DC
Start: ? — End: 2018-01-12

## 2018-01-12 MED ORDER — CHLORHEXIDINE GLUCONATE 4 % EX LIQD
CUTANEOUS | Status: DC
Start: ? — End: 2018-01-12

## 2018-01-12 MED ORDER — METOPROLOL SUCCINATE ER 25 MG PO TB24
25.00 | ORAL_TABLET | ORAL | Status: DC
Start: 2018-01-12 — End: 2018-01-12

## 2018-01-12 NOTE — ED Notes (Signed)
Dr Venora Maples informed of troponin results .[place]

## 2018-01-12 NOTE — ED Notes (Signed)
Pt states that he does not want to wait for his test results, pt eloped.

## 2018-01-12 NOTE — ED Notes (Signed)
Pt refuses to get into gown or have vitals assessed, pt denies CP at this time, wife states that pt is unsteady on his feet, pt ambulated in hallway without assistance. Pt wife upset that pt is not being directly admitted and wants another doctor.

## 2018-01-12 NOTE — ED Notes (Signed)
Pt brought back from x-ray and was refusing to be put on cardiac monitoring and into a gown, pts wife was verbally agressive and upset stating "that they weren't going to comply because we were not doing anything for them" made RN [name] aware.

## 2018-01-12 NOTE — ED Notes (Addendum)
Dr Venora Maples informed of troponin results .[place]

## 2018-01-12 NOTE — ED Triage Notes (Signed)
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 Found suffering from heat unresponsive in the bus on Wednesday evening. Pt was taken to hospital and treated. States he had an elevated troponin and needed further cardiac treatment but pt declined wanting to see his own cardiologist. Pt talked with cardiologist earlier tonight and was told to come to the hospital to be monitored.  Pt has CT-A results in hand. Pt has history of AAA repair.

## 2018-01-12 NOTE — ED Notes (Signed)
Patient transported to X-ray, family in room.
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
## 2018-01-14 ENCOUNTER — Telehealth: Payer: Self-pay | Admitting: Internal Medicine

## 2018-01-14 NOTE — Telephone Encounter (Signed)
Spoke with patient's wife again and wants him to be seen in our office, because he is SOB, although his O2 sat is 98%.  He was SOB and asked his wife if he could use her O2.  He has COPD so I gave her warning of that.  You have an opening Thursday afternoon, should I slip him in there?

## 2018-01-14 NOTE — Telephone Encounter (Signed)
New Message:       Pt's wife is calling and states they wouldn't admit the pt to the hospital. After coming from [facility] after they expressed him needing to have a heart cath.

## 2018-01-14 NOTE — Telephone Encounter (Signed)
See prior phone message from today.  Will review further at appointment later this week.  If symptoms worsen in the meantime, he should go to the ED.  Nelva Bush, MD [facility] Pager: [phone]
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
## 2018-01-14 NOTE — Care Management Note (Signed)
Case Management Note  CM consulted for [facility] follow up from pt's transition home from an ED visit on 8/10 which resulted in the pt eloping.  Pt is ambulatory without assistance per notes and he/spouse can drive, so pt is not a candidate for [facility] services that will be covered by ins.  CM noted pt has another call in with Dr. Saunders Revel this morning and can follow up as outpt as previously discussed with pt.  Dr. Saunders Revel or pt's PCP can prescribe outpt therapy if they feel that the pt needs it.  Updated Dr. Venora Maples via messages.  No further CM needs noted at this time.  Aairah Negrette, Benjaman Lobe, RN 01/14/2018, 10:31 AM

## 2018-01-14 NOTE — Telephone Encounter (Signed)
New Message         Patient's wife called states husband can't breathe well. Would like for someone to call her concerning this matter.

## 2018-01-14 NOTE — Telephone Encounter (Signed)
I reviewed his ED note.  It sounds like the patient was asymptomatic and able to ambulate without difficulty.  ED physician felt that there was no indication for admission.  I see that Kyle Lewis has been added to my schedule for Thursday (8/15).  We will discuss further workup of his CAD and aortic stenosis at that time.  If he has new symptoms in the meantime, he should return to the ED.  Nelva Bush, MD [facility] Pager: [phone]

## 2018-01-15 ENCOUNTER — Telehealth: Payer: Self-pay

## 2018-01-15 NOTE — Telephone Encounter (Signed)
Left patient message confirming appt for 8/15 @ 1:40p.  I told them to call if they have questions.

## 2018-01-15 NOTE — Telephone Encounter (Signed)
Spoke to patient and confirmed appt on 8/15 @ 1:40p.  He acknowledged understanding and knows that if he declines in health to head to the ED.

## 2018-01-17 ENCOUNTER — Encounter (INDEPENDENT_AMBULATORY_CARE_PROVIDER_SITE_OTHER): Payer: Self-pay

## 2018-01-17 ENCOUNTER — Ambulatory Visit (INDEPENDENT_AMBULATORY_CARE_PROVIDER_SITE_OTHER): Payer: Medicare Other | Admitting: Internal Medicine

## 2018-01-17 ENCOUNTER — Encounter: Payer: Self-pay | Admitting: Internal Medicine

## 2018-01-17 VITALS — BP 140/78 | HR 56 | Ht 68.0 in | Wt 230.4 lb

## 2018-01-17 DIAGNOSIS — Z01812 Encounter for preprocedural laboratory examination: Secondary | ICD-10-CM

## 2018-01-17 DIAGNOSIS — I214 Non-ST elevation (NSTEMI) myocardial infarction: Secondary | ICD-10-CM

## 2018-01-17 DIAGNOSIS — I350 Nonrheumatic aortic (valve) stenosis: Secondary | ICD-10-CM | POA: Diagnosis not present

## 2018-01-17 DIAGNOSIS — I10 Essential (primary) hypertension: Secondary | ICD-10-CM

## 2018-01-17 DIAGNOSIS — Z720 Tobacco use: Secondary | ICD-10-CM

## 2018-01-17 DIAGNOSIS — E785 Hyperlipidemia, unspecified: Secondary | ICD-10-CM | POA: Diagnosis not present
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
## 2018-01-17 NOTE — Patient Instructions (Addendum)
Medication Instructions:  Your physician recommends that you continue on your current medications as directed. Please refer to the Current Medication list given to you today.  -- If you need a refill on your cardiac medications before your next appointment, please call your pharmacy. --  Labwork:TODAY  BMET/CBC  Testing/Procedures: Your physician has requested that you have a cardiac catheterization. Cardiac catheterization is used to diagnose and/or treat various heart conditions. Doctors may recommend this procedure for a number of different reasons. The most common reason is to evaluate chest pain. Chest pain can be a symptom of coronary artery disease (CAD), and cardiac catheterization can show whether plaque is narrowing or blocking your heart's arteries. This procedure is also used to evaluate the valves, as well as measure the blood flow and oxygen levels in different parts of your heart. For further information please visit [email or web address]. Please follow instruction sheet, as given.   Follow-Up: After Heart Cath  Thank you for choosing [facility]!!    Any Other Special Instructions Will Be Listed Below (If Applicable).

## 2018-01-18 ENCOUNTER — Other Ambulatory Visit: Payer: Self-pay | Admitting: Internal Medicine

## 2018-01-18 LAB — BASIC METABOLIC PANEL
BUN/Creatinine Ratio: 29 — ABNORMAL HIGH (ref 10–24)
BUN: 20 mg/dL (ref 8–27)
CO2: 26 mmol/L (ref 20–29)
Calcium: 9.3 mg/dL (ref 8.6–10.2)
Chloride: 103 mmol/L (ref 96–106)
Creatinine, Ser: 0.7 mg/dL — ABNORMAL LOW (ref 0.76–1.27)
GFR calc Af Amer: 110 mL/min/{1.73_m2} (ref >59)
GFR calc non Af Amer: 95 mL/min/{1.73_m2} (ref >59)
Glucose: 176 mg/dL — ABNORMAL HIGH (ref 65–99)
Potassium: 4.4 mmol/L (ref 3.5–5.2)
Sodium: 140 mmol/L (ref 134–144)

## 2018-01-18 LAB — CBC
Hematocrit: 38.8 % (ref 37.5–51.0)
Hemoglobin: 13.1 g/dL (ref 13.0–17.7)
MCH: 30.6 pg (ref 26.6–33.0)
MCHC: 33.8 g/dL (ref 31.5–35.7)
MCV: 91 fL (ref 79–97)
Platelets: 357 10*3/uL (ref 150–450)
RBC: 4.28 x10E6/uL (ref 4.14–5.80)
RDW: 15.8 % — ABNORMAL HIGH (ref 12.3–15.4)
WBC: 7.5 10*3/uL (ref 3.4–10.8)

## 2018-01-19 ENCOUNTER — Encounter: Payer: Self-pay | Admitting: Internal Medicine

## 2018-01-19 DIAGNOSIS — E785 Hyperlipidemia, unspecified: Secondary | ICD-10-CM

## 2018-01-19 DIAGNOSIS — Z01812 Encounter for preprocedural laboratory examination: Secondary | ICD-10-CM | POA: Insufficient documentation

## 2018-01-19 HISTORY — DX: Hyperlipidemia, unspecified: E78.5

## 2018-01-19 HISTORY — DX: Encounter for preprocedural laboratory examination: Z01.812

## 2018-01-21 ENCOUNTER — Telehealth: Payer: Self-pay

## 2018-01-21 NOTE — Telephone Encounter (Signed)
Notes recorded by Frederik Schmidt, RN on 01/21/2018 at 8:01 AM EDT Informed patient of results. He verbalized understanding and he can proceed with heart cath.

## 2018-01-21 NOTE — Telephone Encounter (Signed)
-----   Message from Nelva Bush, MD sent at 01/19/2018 10:35 AM EDT ----- BMP within normal limits.  Okay to proceed with catheterization, as planned.

## 2018-01-23 ENCOUNTER — Telehealth: Payer: Self-pay | Admitting: *Deleted

## 2018-01-23 NOTE — Telephone Encounter (Signed)
Pt contacted pre-catheterization scheduled at [facility] for: Thursday August 22,2019 7:30 AM Verified arrival time and place: [place] Entrance A at: 5:30 AM  No solid food after midnight prior to cath, clear liquids until 5 AM day of procedure. Verified allergies in Epic Verified no diabetes medications.  Hold: Losartan-HCTZ AM of procedure.  Except hold medications AM meds can be  taken pre-cath with sip of water including: ASA 81 mg Clopidogrel 75 mg  Confirmed patient has responsible person to drive home post procedure and for 24 hours after you arrive home: yes

## 2018-01-24 ENCOUNTER — Other Ambulatory Visit: Payer: Self-pay

## 2018-01-24 ENCOUNTER — Ambulatory Visit (HOSPITAL_COMMUNITY)
Admission: RE | Admit: 2018-01-24 | Discharge: 2018-01-24 | Disposition: A | Discharge status: Home / Self Care | Payer: Medicare Other | Source: Ambulatory Visit | Attending: Internal Medicine | Admitting: Internal Medicine

## 2018-01-24 ENCOUNTER — Encounter (HOSPITAL_COMMUNITY)
Admission: RE | Disposition: A | Discharge status: Home / Self Care | Payer: Self-pay | Source: Ambulatory Visit | Attending: Internal Medicine

## 2018-01-24 DIAGNOSIS — I714 Abdominal aortic aneurysm, without rupture: Secondary | ICD-10-CM | POA: Insufficient documentation

## 2018-01-24 DIAGNOSIS — I350 Nonrheumatic aortic (valve) stenosis: Secondary | ICD-10-CM

## 2018-01-24 DIAGNOSIS — E785 Hyperlipidemia, unspecified: Secondary | ICD-10-CM | POA: Insufficient documentation

## 2018-01-24 DIAGNOSIS — J449 Chronic obstructive pulmonary disease, unspecified: Secondary | ICD-10-CM | POA: Diagnosis not present

## 2018-01-24 DIAGNOSIS — I2510 Atherosclerotic heart disease of native coronary artery without angina pectoris: Secondary | ICD-10-CM | POA: Diagnosis not present

## 2018-01-24 DIAGNOSIS — Z7902 Long term (current) use of antithrombotics/antiplatelets: Secondary | ICD-10-CM | POA: Insufficient documentation

## 2018-01-24 DIAGNOSIS — E669 Obesity, unspecified: Secondary | ICD-10-CM | POA: Insufficient documentation

## 2018-01-24 DIAGNOSIS — I10 Essential (primary) hypertension: Secondary | ICD-10-CM | POA: Diagnosis not present

## 2018-01-24 DIAGNOSIS — Z8673 Personal history of transient ischemic attack (TIA), and cerebral infarction without residual deficits: Secondary | ICD-10-CM | POA: Insufficient documentation

## 2018-01-24 DIAGNOSIS — Z8249 Family history of ischemic heart disease and other diseases of the circulatory system: Secondary | ICD-10-CM | POA: Diagnosis not present

## 2018-01-24 DIAGNOSIS — I252 Old myocardial infarction: Secondary | ICD-10-CM | POA: Insufficient documentation

## 2018-01-24 DIAGNOSIS — Z881 Allergy status to other antibiotic agents status: Secondary | ICD-10-CM | POA: Insufficient documentation

## 2018-01-24 DIAGNOSIS — Z6835 Body mass index (BMI) 35.0-35.9, adult: Secondary | ICD-10-CM | POA: Insufficient documentation

## 2018-01-24 DIAGNOSIS — I214 Non-ST elevation (NSTEMI) myocardial infarction: Secondary | ICD-10-CM | POA: Diagnosis not present

## 2018-01-24 DIAGNOSIS — Z79899 Other long term (current) drug therapy: Secondary | ICD-10-CM | POA: Diagnosis not present

## 2018-01-24 DIAGNOSIS — F17210 Nicotine dependence, cigarettes, uncomplicated: Secondary | ICD-10-CM | POA: Diagnosis not present

## 2018-01-24 HISTORY — PX: RIGHT/LEFT HEART CATH AND CORONARY ANGIOGRAPHY: CATH118266

## 2018-01-24 LAB — POCT I-STAT 3, VENOUS BLOOD GAS (G3P V)
Acid-Base Excess: 2 mmol/L (ref 0.0–2.0)
Acid-Base Excess: 3 mmol/L — ABNORMAL HIGH (ref 0.0–2.0)
Acid-Base Excess: 3 mmol/L — ABNORMAL HIGH (ref 0.0–2.0)
Acid-Base Excess: 3 mmol/L — ABNORMAL HIGH (ref 0.0–2.0)
Bicarbonate: 28.7 mmol/L — ABNORMAL HIGH (ref 20.0–28.0)
Bicarbonate: 28.8 mmol/L — ABNORMAL HIGH (ref 20.0–28.0)
Bicarbonate: 29.2 mmol/L — ABNORMAL HIGH (ref 20.0–28.0)
Bicarbonate: 29.3 mmol/L — ABNORMAL HIGH (ref 20.0–28.0)
O2 Saturation: 71 %
O2 Saturation: 75 %
O2 Saturation: 76 %
O2 Saturation: 78 %
TCO2: 30 mmol/L (ref 22–32)
TCO2: 30 mmol/L (ref 22–32)
TCO2: 31 mmol/L (ref 22–32)
TCO2: 31 mmol/L (ref 22–32)
pCO2, Ven: 50.5 mmHg (ref 44.0–60.0)
pCO2, Ven: 50.6 mmHg (ref 44.0–60.0)
pCO2, Ven: 51.2 mmHg (ref 44.0–60.0)
pCO2, Ven: 51.8 mmHg (ref 44.0–60.0)
pH, Ven: 7.36 (ref 7.250–7.430)
pH, Ven: 7.362 (ref 7.250–7.430)
pH, Ven: 7.363 (ref 7.250–7.430)
pH, Ven: 7.364 (ref 7.250–7.430)
pO2, Ven: 39 mmHg (ref 32.0–45.0)
pO2, Ven: 42 mmHg (ref 32.0–45.0)
pO2, Ven: 43 mmHg (ref 32.0–45.0)
pO2, Ven: 45 mmHg (ref 32.0–45.0)

## 2018-01-24 LAB — POCT I-STAT 3, ART BLOOD GAS (G3+)
Acid-Base Excess: 3 mmol/L — ABNORMAL HIGH (ref 0.0–2.0)
Bicarbonate: 28.4 mmol/L — ABNORMAL HIGH (ref 20.0–28.0)
O2 Saturation: 99 %
TCO2: 30 mmol/L (ref 22–32)
pCO2 arterial: 44.6 mmHg (ref 32.0–48.0)
pH, Arterial: 7.411 (ref 7.350–7.450)
pO2, Arterial: 115 mmHg — ABNORMAL HIGH (ref 83.0–108.0)

## 2018-01-24 SURGERY — RIGHT/LEFT HEART CATH AND CORONARY ANGIOGRAPHY
Anesthesia: LOCAL

## 2018-01-24 MED ORDER — SODIUM CHLORIDE 0.9 % IV SOLN: INTRAVENOUS | Status: DC

## 2018-01-24 MED ORDER — SODIUM CHLORIDE 0.9 % IV SOLN: 250.0000 mL | INTRAVENOUS | Status: DC | PRN

## 2018-01-24 MED ORDER — SODIUM CHLORIDE 0.9% FLUSH: 3.0000 mL | INTRAVENOUS | Status: DC | PRN

## 2018-01-24 MED ORDER — VERAPAMIL HCL 2.5 MG/ML IV SOLN
INTRAVENOUS | Status: DC | PRN
  Administered 2018-01-24: 10 mL via INTRA_ARTERIAL

## 2018-01-24 MED ORDER — HEPARIN SODIUM (PORCINE) 1000 UNIT/ML IJ SOLN
INTRAMUSCULAR | Status: AC
  Filled 2018-01-24: qty 1

## 2018-01-24 MED ORDER — SODIUM CHLORIDE 0.9% FLUSH: 3.0000 mL | Freq: Two times a day (BID) | INTRAVENOUS | Status: DC

## 2018-01-24 MED ORDER — HEPARIN (PORCINE) IN NACL 1000-0.9 UT/500ML-% IV SOLN
INTRAVENOUS | Status: AC
  Filled 2018-01-24: qty 1000

## 2018-01-24 MED ORDER — HEPARIN (PORCINE) IN NACL 1000-0.9 UT/500ML-% IV SOLN
INTRAVENOUS | Status: DC | PRN
  Administered 2018-01-24 (×2): 500 mL

## 2018-01-24 MED ORDER — ACETAMINOPHEN 325 MG PO TABS: 650.0000 mg | ORAL_TABLET | ORAL | Status: DC | PRN

## 2018-01-24 MED ORDER — VERAPAMIL HCL 2.5 MG/ML IV SOLN
INTRAVENOUS | Status: AC
  Filled 2018-01-24: qty 2

## 2018-01-24 MED ORDER — ONDANSETRON HCL 4 MG/2ML IJ SOLN: 4.0000 mg | Freq: Four times a day (QID) | INTRAMUSCULAR | Status: DC | PRN

## 2018-01-24 MED ORDER — LIDOCAINE HCL (PF) 1 % IJ SOLN
INTRAMUSCULAR | Status: DC | PRN
  Administered 2018-01-24: 4 mL

## 2018-01-24 MED ORDER — FENTANYL CITRATE (PF) 100 MCG/2ML IJ SOLN
INTRAMUSCULAR | Status: AC
  Filled 2018-01-24: qty 2

## 2018-01-24 MED ORDER — IOHEXOL 350 MG/ML SOLN
INTRAVENOUS | Status: DC | PRN
  Administered 2018-01-24: 50 mL via INTRAVENOUS

## 2018-01-24 MED ORDER — LIDOCAINE HCL (PF) 1 % IJ SOLN
INTRAMUSCULAR | Status: AC
  Filled 2018-01-24: qty 30

## 2018-01-24 MED ORDER — FENTANYL CITRATE (PF) 100 MCG/2ML IJ SOLN
INTRAMUSCULAR | Status: DC | PRN
  Administered 2018-01-24: 50 ug via INTRAVENOUS

## 2018-01-24 MED ORDER — MIDAZOLAM HCL 2 MG/2ML IJ SOLN
INTRAMUSCULAR | Status: AC
  Filled 2018-01-24: qty 2

## 2018-01-24 MED ORDER — HEPARIN SODIUM (PORCINE) 1000 UNIT/ML IJ SOLN
INTRAMUSCULAR | Status: DC | PRN
  Administered 2018-01-24: 5000 [IU] via INTRAVENOUS

## 2018-01-24 MED ORDER — SODIUM CHLORIDE 0.9 % WEIGHT BASED INFUSION
3.0000 mL/kg/h | INTRAVENOUS | Status: DC
  Administered 2018-01-24: 3 mL/kg/h via INTRAVENOUS

## 2018-01-24 MED ORDER — SODIUM CHLORIDE 0.9 % WEIGHT BASED INFUSION: 1.0000 mL/kg/h | INTRAVENOUS | Status: DC

## 2018-01-24 MED ORDER — MIDAZOLAM HCL 2 MG/2ML IJ SOLN
INTRAMUSCULAR | Status: DC | PRN
  Administered 2018-01-24: 1 mg via INTRAVENOUS

## 2018-01-24 MED ORDER — ASPIRIN 81 MG PO CHEW: 81.0000 mg | CHEWABLE_TABLET | ORAL | Status: DC

## 2018-01-24 SURGICAL SUPPLY — 13 items
CATH 5FR JL3.5 JR4 ANG PIG MP (CATHETERS) ×2 IMPLANT
CATH BALLN WEDGE 5F 110CM (CATHETERS) ×2 IMPLANT
CATH LANGSTON DUAL LUM PIG 6FR (CATHETERS) ×2 IMPLANT
DEVICE RAD COMP TR BAND LRG (VASCULAR PRODUCTS) ×2 IMPLANT
GLIDESHEATH SLEND SS 6F .021 (SHEATH) ×2 IMPLANT
GUIDEWIRE INQWIRE 1.5J.035X260 (WIRE) ×1 IMPLANT
INQWIRE 1.5J .035X260CM (WIRE) ×2
KIT HEART LEFT (KITS) ×2 IMPLANT
KIT HEART RIGHT NAMIC (KITS) ×2 IMPLANT
PACK CARDIAC CATHETERIZATION (CUSTOM PROCEDURE TRAY) ×2 IMPLANT
SHEATH GLIDE SLENDER 4/5FR (SHEATH) ×2 IMPLANT
TRANSDUCER W/STOPCOCK (MISCELLANEOUS) ×2 IMPLANT
TUBING CIL FLEX 10 FLL-RA (TUBING) ×2 IMPLANT

## 2018-01-24 NOTE — Brief Op Note (Signed)
BRIEF CARDIAC CATHETERIZATION NOTE  DATE: 01/24/2018 TIME: 8:28 AM  PATIENT:  Kyle Lewis  72 y.o. male  PRE-OPERATIVE DIAGNOSIS:  Aortic stenosis and recent NSTEMI  POST-OPERATIVE DIAGNOSIS:  Moderate aortic stenosis and non-critical coronary artery disease  PROCEDURE:  Procedure(s): RIGHT/LEFT HEART CATH AND CORONARY ANGIOGRAPHY (N/A)  SURGEON:  Surgeon(s) and Role:    * Earl Losee, MD - Primary  FINDINGS: 1. Stable appearance of coronary arteries since 2018.  Moderate to severe disease involving the distal RCA/rPDA and OM2 is not significantly changed and involves relatively small vessels. 2. Upper normal to mildly elevated left and right heart filling pressures. 3. Normal Fick cardiac output/index. 4. Moderate aortic stenosis.  RECOMMENDATIONS: 1. Continue medical therapy. 2. Obtain outpatient echo to reassess aortic valve, given discrepant findings between recent outside echo (severe AS) and today's catheterization.  Nelva Bush, MD [facility] Pager: [phone]

## 2018-01-24 NOTE — Interval H&P Note (Signed)
History and Physical Interval Note:  01/24/2018 7:26 AM  Kyle Lewis  has presented today for cardiac catheterization, with the diagnosis of aortic stenosis and recent NSTEMI. The various methods of treatment have been discussed with the patient and family. After consideration of risks, benefits and other options for treatment, the patient has consented to  Procedure(s): RIGHT/LEFT HEART CATH AND CORONARY ANGIOGRAPHY (N/A) as a surgical intervention .  The patient's history has been reviewed, patient examined, no change in status, stable for surgery.  I have reviewed the patient's chart and labs.  Questions were answered to the patient's satisfaction.    Cath Lab Visit (complete for each Cath Lab visit)  Clinical Evaluation Leading to the Procedure:   ACS: Yes.  (recent NSTEMI)  Non-ACS:    Anginal Classification: CCS III  Anti-ischemic medical therapy: Minimal Therapy (1 class of medications)  Non-Invasive Test Results: No non-invasive testing performed  Prior CABG: No previous CABG  Nalina Yeatman

## 2018-01-24 NOTE — Discharge Instructions (Signed)

## 2018-01-25 ENCOUNTER — Encounter (HOSPITAL_COMMUNITY): Payer: Self-pay | Admitting: Internal Medicine

## 2018-01-29 ENCOUNTER — Telehealth: Payer: Self-pay

## 2018-01-29 NOTE — Telephone Encounter (Signed)
-----   Message from Frederik Schmidt, RN sent at 01/24/2018 10:58 AM EDT ----- Regarding: FW: Post-cath follow-up Please re-establish patient with Dr Geraldo Pitter in [place] per patient.  Okay with Dr End here in [place].  Dr End would like him scheduled in 2 weeks, if possible.  Patient is post-cath 8/22 Aortic Stenosis. Thank you ----- Message ----- From: Nelva Bush, MD Sent: 01/24/2018  10:24 AM EDT To: Jenean Lindau, MD, Frederik Schmidt, RN Subject: Post-cath follow-up                            Kyle Lewis,  Can you help arrange follow-up for Mr. [place]burg in about 2 weeks?  He lives and [place] and would like to reestablish with Dr. Geraldo Pitter now that our office in [place] is open.  I also recommend that we obtain an echo at the patient's convenience, given recent outside echo report suggesting severe AS but today's catheterization showing moderate AS.  Thanks.  Gerald Stabs

## 2018-01-29 NOTE — Telephone Encounter (Signed)
Post cath follow-up appointment was made with patient's wife (okay per DPR). Patient will transition to [place] okay with Dr. Saunders Revel.

## 2018-01-30 ENCOUNTER — Ambulatory Visit (HOSPITAL_COMMUNITY): Payer: Medicare Other | Attending: Cardiology

## 2018-01-30 ENCOUNTER — Other Ambulatory Visit: Payer: Self-pay

## 2018-01-30 ENCOUNTER — Telehealth: Payer: Self-pay

## 2018-01-30 DIAGNOSIS — Z8673 Personal history of transient ischemic attack (TIA), and cerebral infarction without residual deficits: Secondary | ICD-10-CM | POA: Diagnosis not present

## 2018-01-30 DIAGNOSIS — Z951 Presence of aortocoronary bypass graft: Secondary | ICD-10-CM | POA: Insufficient documentation

## 2018-01-30 DIAGNOSIS — Z87891 Personal history of nicotine dependence: Secondary | ICD-10-CM | POA: Insufficient documentation

## 2018-01-30 DIAGNOSIS — I350 Nonrheumatic aortic (valve) stenosis: Secondary | ICD-10-CM | POA: Diagnosis present

## 2018-01-30 DIAGNOSIS — Z6837 Body mass index (BMI) 37.0-37.9, adult: Secondary | ICD-10-CM | POA: Diagnosis not present

## 2018-01-30 DIAGNOSIS — I2510 Atherosclerotic heart disease of native coronary artery without angina pectoris: Secondary | ICD-10-CM | POA: Insufficient documentation

## 2018-01-30 DIAGNOSIS — I10 Essential (primary) hypertension: Secondary | ICD-10-CM | POA: Insufficient documentation

## 2018-01-30 DIAGNOSIS — J449 Chronic obstructive pulmonary disease, unspecified: Secondary | ICD-10-CM | POA: Insufficient documentation

## 2018-01-30 DIAGNOSIS — E785 Hyperlipidemia, unspecified: Secondary | ICD-10-CM | POA: Diagnosis not present

## 2018-01-30 DIAGNOSIS — I219 Acute myocardial infarction, unspecified: Secondary | ICD-10-CM | POA: Diagnosis not present

## 2018-01-30 NOTE — Telephone Encounter (Signed)
-----   Message from Nelva Bush, MD sent at 01/30/2018  3:14 PM EDT ----- Please let Mr. Kyle Lewis know that his echo shows vigorous LV contraction with moderate aortic stenosis, as noted on recent catheterization.  I do not recommend any further testing or intervention at this time.  She should follow-up as planned with Dr. Geraldo Pitter.  I think it is okay for him to return to work if cleared by his wound care specialist.

## 2018-01-30 NOTE — Telephone Encounter (Signed)
Notes recorded by Frederik Schmidt, RN on 01/30/2018 at 3:49 PM EDT Informed patient's wife of results/recommendations. She verbalized understanding and thanked [place] for the call. ------

## 2018-01-30 NOTE — Telephone Encounter (Signed)
Patient's wife called in regards to submitting a letter allowing the patient to return to work next week.  Are you ok with me doing that?

## 2018-01-30 NOTE — Telephone Encounter (Signed)
Dr End, patient had his Echo today.  I'll wait for your interpretation.  Thank you.

## 2018-01-30 NOTE — Telephone Encounter (Signed)
Please let Kyle Lewis know that his echo shows vigorous LV contraction with moderate aortic stenosis, as noted on recent catheterization.  I do not recommend any further testing or intervention at this time.  She should follow-up as planned with Dr. Geraldo Pitter.  I think it is okay for him to return to work if cleared by his wound care specialist.

## 2018-01-30 NOTE — Telephone Encounter (Signed)
He needs echo first.  Nelva Bush, MD [place] Pager: [phone]

## 2018-02-01 ENCOUNTER — Ambulatory Visit (INDEPENDENT_AMBULATORY_CARE_PROVIDER_SITE_OTHER): Payer: Medicare Other | Admitting: Cardiology

## 2018-02-01 ENCOUNTER — Encounter: Payer: Self-pay | Admitting: Cardiology

## 2018-02-01 VITALS — BP 132/76 | HR 61 | Ht 66.0 in | Wt 231.0 lb

## 2018-02-01 DIAGNOSIS — I2510 Atherosclerotic heart disease of native coronary artery without angina pectoris: Secondary | ICD-10-CM

## 2018-02-01 DIAGNOSIS — Z8679 Personal history of other diseases of the circulatory system: Secondary | ICD-10-CM | POA: Insufficient documentation

## 2018-02-01 DIAGNOSIS — Z8673 Personal history of transient ischemic attack (TIA), and cerebral infarction without residual deficits: Secondary | ICD-10-CM | POA: Diagnosis not present

## 2018-02-01 DIAGNOSIS — E785 Hyperlipidemia, unspecified: Secondary | ICD-10-CM | POA: Diagnosis not present

## 2018-02-01 DIAGNOSIS — I350 Nonrheumatic aortic (valve) stenosis: Secondary | ICD-10-CM

## 2018-02-01 DIAGNOSIS — I10 Essential (primary) hypertension: Secondary | ICD-10-CM | POA: Diagnosis not present

## 2018-02-01 DIAGNOSIS — Z9889 Other specified postprocedural states: Secondary | ICD-10-CM

## 2018-02-01 HISTORY — DX: Personal history of other diseases of the circulatory system: Z86.79

## 2018-02-01 HISTORY — DX: Atherosclerotic heart disease of native coronary artery without angina pectoris: I25.10

## 2018-02-01 HISTORY — DX: Nonrheumatic aortic (valve) stenosis: I35.0

## 2018-02-01 LAB — LIPID PANEL
Chol/HDL Ratio: 3 ratio (ref 0.0–5.0)
Cholesterol, Total: 144 mg/dL (ref 100–199)
HDL: 48 mg/dL (ref >39)
LDL Calculated: 78 mg/dL (ref 0–99)
Triglycerides: 92 mg/dL (ref 0–149)
VLDL Cholesterol Cal: 18 mg/dL (ref 5–40)

## 2018-02-01 LAB — BASIC METABOLIC PANEL
BUN/Creatinine Ratio: 19 (ref 10–24)
BUN: 13 mg/dL (ref 8–27)
CO2: 28 mmol/L (ref 20–29)
Calcium: 9.3 mg/dL (ref 8.6–10.2)
Chloride: 100 mmol/L (ref 96–106)
Creatinine, Ser: 0.7 mg/dL — ABNORMAL LOW (ref 0.76–1.27)
GFR calc Af Amer: 110 mL/min/{1.73_m2} (ref >59)
GFR calc non Af Amer: 95 mL/min/{1.73_m2} (ref >59)
Glucose: 116 mg/dL — ABNORMAL HIGH (ref 65–99)
Potassium: 4.3 mmol/L (ref 3.5–5.2)
Sodium: 139 mmol/L (ref 134–144)

## 2018-02-01 LAB — HEPATIC FUNCTION PANEL
ALT: 12 IU/L (ref 0–44)
AST: 12 IU/L (ref 0–40)
Albumin: 3.8 g/dL (ref 3.5–4.8)
Alkaline Phosphatase: 104 IU/L (ref 39–117)
Bilirubin Total: 0.3 mg/dL (ref 0.0–1.2)
Bilirubin, Direct: 0.12 mg/dL (ref 0.00–0.40)
Total Protein: 6.4 g/dL (ref 6.0–8.5)

## 2018-02-01 NOTE — Patient Instructions (Signed)
Medication Instructions:  Your physician recommends that you continue on your current medications as directed. Please refer to the Current Medication list given to you today.  Labwork: Your physician recommends that you have the following labs drawn: BMP, liver and lipid panel  Testing/Procedures: None  Follow-Up: Your physician recommends that you schedule a follow-up appointment in: 6 months  Any Other Special Instructions Will Be Listed Below (If Applicable).     If you need a refill on your cardiac medications before your next appointment, please call your pharmacy.   [place], RN, BSN

## 2018-02-01 NOTE — Progress Notes (Signed)
Cardiology Office Note:    Date:  02/01/2018   ID:  Kyle Lewis, DOB 1945/12/16, MRN [id number]  PCP:  [name], MD  Cardiologist:  Jenean Lindau, MD   Referring MD: [name], MD    ASSESSMENT:    1. Essential hypertension   2. History of right MCA stroke   3. Hyperlipidemia LDL goal <70   4. Coronary artery disease involving native coronary artery of native heart without angina pectoris   5. Moderate aortic stenosis    PLAN:    In order of problems listed above:  1. Secondary prevention stressed to the patient.  Importance of compliance with diet and medication stressed and he vocalized understanding.  I advised him to walk regularly and lose weight any he is agreeable. 2. He is fasting this morning and have blood work including lipids.  He was told by his doctors at [facility] after coronary angiography that he could go back.  I agree with this.  He was given a note to that effect to take to work. 3. Patient will be seen in follow-up appointment in 6 months or earlier if the patient has any concerns    Medication Adjustments/Labs and Tests Ordered: Current medicines are reviewed at length with the patient today.  Concerns regarding medicines are outlined above.  No orders of the defined types were placed in this encounter.  No orders of the defined types were placed in this encounter.    No chief complaint on file.    History of Present Illness:    Kyle Lewis is a 72 y.o. male.  The patient has known coronary artery disease and aortic stenosis.  He mentions to me that he has no problems with exertion such as chest pain dizziness syncope or any shortness of breath.  He takes care of activities of daily living.  At the time of my evaluation, the patient is alert awake oriented and in no distress.  Past Medical History:  Diagnosis Date  . 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 Adenomatous  colon polyp   . Adrenal mass ([place])    2 benign appearing left adrenal adenomas noted on 01/13/15 CT  . Allergy    seasonal  . Aortic stenosis    mild to moderate by echo and cath in 09/2016  . Arthritis   . BPH (benign prostatic hyperplasia)   . Carotid artery occlusion   . Cataract   . Chronic back pain   . COPD (chronic obstructive pulmonary disease) ([place])   . Coronary artery disease   . CVA (cerebral infarction)   . Diverticulosis   . Hypertension   . Nocturia   . Peripheral vascular disease ([place])   . Sleep apnea    wears CPAP  . Stroke [facility]) 2013   tia     Past Surgical History:  Procedure Laterality Date  . APPENDECTOMY    . BRAIN MENINGIOMA EXCISION     menigioma  . CARPAL TUNNEL RELEASE     left  . cataracts     both eyes  . EYE SURGERY Bilateral    Cataract  . INGUINAL HERNIA REPAIR    . JOINT REPLACEMENT Left 04-01-12   Knee  . LEFT HEART CATH AND CORONARY ANGIOGRAPHY N/A 09/15/2016   Procedure: Left Heart Cath and Coronary Angiography;  Surgeon: Nelva Bush, MD;  Location: [facility] CV LAB;  Service: Cardiovascular;  Laterality:  N/A;  . MAXIMUM ACCESS (MAS)POSTERIOR LUMBAR INTERBODY FUSION (PLIF) 1 LEVEL N/A 07/07/2015   Procedure:  POSTERIOR LUMBAR INTERBODY FUSION (PLIF) Lumbar Four-Five with Pedicle Screw Fixation Lumbar Two-Five;Laminectomy Lumbar Two-Five;  Surgeon: Eustace Moore, MD;  Location: [place] NEURO ORS;  Service: Neurosurgery;  Laterality: N/A;   POSTERIOR LUMBAR INTERBODY FUSION (PLIF) Lumbar Four-Five with Pedicle Screw Fixation Lumbar Two-Five;Laminectomy Lumbar Two-Five  . PROSTATE SURGERY    . RIGHT/LEFT HEART CATH AND CORONARY ANGIOGRAPHY N/A 01/24/2018   Procedure: RIGHT/LEFT HEART CATH AND CORONARY ANGIOGRAPHY;  Surgeon: Nelva Bush, MD;  Location: [place] CV LAB;  Service: Cardiovascular;  Laterality: N/A;  . TONSILLECTOMY    . TOTAL KNEE ARTHROPLASTY  04/01/2012   Procedure: TOTAL KNEE ARTHROPLASTY;  Surgeon: Gearlean Alf, MD;   Location: [facility] ORS;  Service: Orthopedics;  Laterality: Left;  . TRANSURETHRAL RESECTION OF PROSTATE    . UMBILICAL HERNIA REPAIR  2011    Current Medications: Current Meds  Medication Sig  . amLODipine (NORVASC) 2.5 MG tablet Take 1 tablet (2.5 mg total) by mouth daily. Please make yearly appt with Dr. Saunders Revel for May before anymore refills. 1st attempt (Patient taking differently: Take 2.5 mg by mouth daily. )  . atorvastatin (LIPITOR) 20 MG tablet Take 20 mg by mouth every evening.  . clopidogrel (PLAVIX) 75 MG tablet Take 1 tablet (75 mg total) by mouth daily.  . finasteride (PROSCAR) 5 MG tablet Take 5 mg by mouth daily.  Marland Kitchen losartan-hydrochlorothiazide (HYZAAR) 100-25 MG per tablet Take 1 tablet by mouth daily.   . Tamsulosin HCl (FLOMAX) 0.4 MG CAPS Take 0.4 mg by mouth daily after supper.      Allergies:   Cefazolin   Social History   Socioeconomic History  . Marital status: Married    Spouse name: [name]   . Number of children: 2  . Years of education: College   . Highest education level: Not on file  Occupational History    Comment: Self employed Administrator   Social Needs  . Financial resource strain: Not on file  . Food insecurity:    Worry: Not on file    Inability: Not on file  . Transportation needs:    Medical: Not on file    Non-medical: Not on file  Tobacco Use  . Smoking status: Current Every Day Smoker    Packs/day: 0.75    Years: 40.00    Pack years: 30.00    Types: Cigarettes  . Smokeless tobacco: Never Used  Substance and Sexual Activity  . Alcohol use: Yes    Comment: rare  . Drug use: No  . Sexual activity: Not on file  Lifestyle  . Physical activity:    Days per week: Not on file    Minutes per session: Not on file  . Stress: Not on file  Relationships  . Social connections:    Talks on phone: Not on file    Gets together: Not on file    Attends religious service: Not on file    Active member of club or organization: Not on file     Attends meetings of clubs or organizations: Not on file    Relationship status: Not on file  Other Topics Concern  . Not on file  Social History Narrative   Patient lives at home with his wife. Rhonda    Patient has a Financial risk analyst.    Patient has 2 sons.    Patient smokes a half pack a day.  Family History: The patient's family history includes Diabetes in his father; Heart attack in his father; Heart disease in his father and mother; Hyperlipidemia in his mother; Hypertension in his mother; Prostate cancer in his maternal grandfather. There is no history of Colon cancer, Esophageal cancer, Rectal cancer, or Stomach cancer.  ROS:   Please see the history of present illness.    All other systems reviewed and are negative.  EKGs/Labs/Other Studies Reviewed:    The following studies were reviewed today: Discussed findings of the coronary angiography with the patient at extensive length and questions were answered to satisfaction.   Recent Labs: 01/17/2018: Hemoglobin 13.1; Platelets 357 01/18/2018: BUN 20; Creatinine, Ser 0.70; Potassium 4.4; Sodium 140  Recent Lipid Panel    Component Value Date/Time   CHOL 130 12/27/2016 0821   TRIG 158 (H) 12/27/2016 0821   HDL 48 12/27/2016 0821   CHOLHDL 2.7 12/27/2016 0821   CHOLHDL 4.0 09/15/2016 0824   VLDL 14 09/15/2016 0824   LDLCALC 50 12/27/2016 0821    Physical Exam:    VS:  BP 132/76 (BP Location: Right Arm, Patient Position: Sitting, Cuff Size: Normal)   Pulse 61   Ht 5\' 6"  (1.676 m)   Wt 231 lb (104.8 kg)   SpO2 98%   BMI 37.28 kg/m     Wt Readings from Last 3 Encounters:  02/01/18 231 lb (104.8 kg)  01/24/18 230 lb (104.3 kg)  01/17/18 230 lb 6.4 oz (104.5 kg)     GEN: Patient is in no acute distress HEENT: Normal NECK: No JVD; No carotid bruits LYMPHATICS: No lymphadenopathy CARDIAC: Hear sounds regular, 2/6 systolic murmur at the apex. RESPIRATORY:  Clear to auscultation without rales, wheezing or  rhonchi  ABDOMEN: Soft, non-tender, non-distended MUSCULOSKELETAL:  No edema; No deformity  SKIN: Warm and dry NEUROLOGIC:  Alert and oriented x 3 PSYCHIATRIC:  Normal affect   Signed, Jenean Lindau, MD  02/01/2018 10:44 AM    [place]

## 2018-02-05 ENCOUNTER — Telehealth: Payer: Self-pay

## 2018-02-05 NOTE — Telephone Encounter (Signed)
patient was called and notified of results.

## 2018-02-05 NOTE — Telephone Encounter (Signed)
-----   Message from Jenean Lindau, MD sent at 02/05/2018  9:00 AM EDT ----- The results of the study is unremarkable. Please inform patient. I will discuss in detail at next appointment. Cc  primary care/referring physician Jenean Lindau, MD 02/05/2018 9:00 AM

## 2018-02-26 ENCOUNTER — Encounter: Payer: Self-pay | Admitting: Podiatry

## 2018-02-26 ENCOUNTER — Ambulatory Visit (INDEPENDENT_AMBULATORY_CARE_PROVIDER_SITE_OTHER): Payer: Medicare Other | Admitting: Podiatry

## 2018-02-26 VITALS — BP 152/81 | HR 61 | Resp 16

## 2018-02-26 DIAGNOSIS — D689 Coagulation defect, unspecified: Secondary | ICD-10-CM

## 2018-02-26 DIAGNOSIS — S81802A Unspecified open wound, left lower leg, initial encounter: Secondary | ICD-10-CM

## 2018-02-26 DIAGNOSIS — B351 Tinea unguium: Secondary | ICD-10-CM

## 2018-02-26 NOTE — Progress Notes (Signed)
Subjective:  Patient ID: Kyle Lewis, male    DOB: [birth year],  MRN: [id number]  Chief Complaint  Patient presents with  . debride    BL nail trimming -pt on BT    72 y.o. male presents with the above complaint.  Here for routine foot care.  Still on blood thinner.  Also complains of a burn to left ankle currently being treated by wound care center.  Patient request dressing change of the wound today.   Review of Systems: Negative except as noted in the HPI. Denies N/V/F/Ch.  Past Medical History:  Diagnosis Date  . 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 Adenomatous colon polyp   . Adrenal mass ([place])    2 benign appearing left adrenal adenomas noted on 01/13/15 CT  . Allergy    seasonal  . Aortic stenosis    mild to moderate by echo and cath in 09/2016  . Arthritis   . BPH (benign prostatic hyperplasia)   . Carotid artery occlusion   . Cataract   . Chronic back pain   . COPD (chronic obstructive pulmonary disease) ([place])   . Coronary artery disease   . CVA (cerebral infarction)   . Diverticulosis   . Hypertension   . Nocturia   . Peripheral vascular disease ([place])   . Sleep apnea    wears CPAP  . Stroke [facility]) 2013   tia     Current Outpatient Medications:  .  amLODipine (NORVASC) 2.5 MG tablet, Take 1 tablet (2.5 mg total) by mouth daily. Please make yearly appt with Dr. Saunders Revel for May before anymore refills. 1st attempt (Patient taking differently: Take 2.5 mg by mouth daily. ), Disp: 30 tablet, Rfl: 4 .  atorvastatin (LIPITOR) 20 MG tablet, Take 20 mg by mouth every evening., Disp: , Rfl:  .  clopidogrel (PLAVIX) 75 MG tablet, Take 1 tablet (75 mg total) by mouth daily., Disp: 90 tablet, Rfl: 0 .  finasteride (PROSCAR) 5 MG tablet, Take 5 mg by mouth daily., Disp: , Rfl:  .  losartan-hydrochlorothiazide (HYZAAR) 100-25 MG per tablet, Take 1 tablet by mouth daily. , Disp: , Rfl:  .  Tamsulosin HCl (FLOMAX) 0.4 MG CAPS, Take  0.4 mg by mouth daily after supper. , Disp: , Rfl:   Social History   Tobacco Use  Smoking Status Current Every Day Smoker  . Packs/day: 0.75  . Years: 40.00  . Pack years: 30.00  . Types: Cigarettes  Smokeless Tobacco Never Used    Allergies  Allergen Reactions  . Cefazolin Rash    The patient had surgery and was given cefazolin intraop. ~ 10 days later he developed a rash confirmed by biopsy to be consistent w/ drug eruption. We cannot know for sure, but this is the most likely agent.     Objective:   Vitals:   02/26/18 1321  BP: (!) 152/81  Pulse: 61  Resp: 16   There is no height or weight on file to calculate BMI. Constitutional Well developed. Well nourished.  Vascular Dorsalis pedis pulses palpable bilaterally. Posterior tibial pulses palpable bilaterally. Capillary refill normal to all digits.  No cyanosis or clubbing noted. Pedal hair growth normal.  Neurologic Normal speech. Oriented to person, place, and time. Epicritic sensation to light touch grossly present bilaterally.  Dermatologic Nails well groomed and normal in appearance. Left ankle burn with thin epithelialization noted No skin lesions.  Orthopedic: Normal joint ROM  without pain or crepitus bilaterally. No visible deformities. No bony tenderness.   Radiographs: None Assessment:   1. 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 Open wound of left lower leg, initial encounter    Plan:  Patient was evaluated and treated and all questions answered.  Onychomycosis with coagulation defect -Nails debrided x10 -Small iatrogenic bleed right hallux cauterized with silver nitrate  Burn to left leg -Dressing change per patient request.  Dressed with Silvadene ABD pad Kerlix Ace bandage  Return in about 2 months (around 04/29/2018) for Routine Foot Care - on anticoagulant.

## 2018-04-30 ENCOUNTER — Ambulatory Visit: Payer: Medicare Other | Admitting: Podiatry

## 2018-08-27 ENCOUNTER — Telehealth: Payer: Self-pay | Admitting: Internal Medicine

## 2018-08-27 NOTE — Telephone Encounter (Signed)
Called and spoke with Kyle Lewis. She stated Patient needs OV/televist for bipap compliance.  As of this time, Patient is scheduled with Dr Chase Caller, for COPD consult, 09/04/18.  Bethanne Ginger stated Patient is willing to see another pulmonologist/sleep doc for bipap compliance. Bethanne Ginger stated she would call the Patient to let him know,as of now, he is scheduled with MR, 09/04/18. Judith Blonder, if Patient receives a  call, to explain his need, and willing to see another provider, if needed. Nothing further at this time.

## 2018-08-27 NOTE — Telephone Encounter (Signed)
Spoke with pt wife, [name] [facility]) and she stated they wanted to keep appt and come in.  Instructed her to stay in the house and stay safe and come in for appt on 09/04/18.  If she needs anything please call us.  She also has appt with MR on 09/04/18

## 2018-09-04 ENCOUNTER — Institutional Professional Consult (permissible substitution): Payer: Medicare Other | Admitting: Internal Medicine

## 2018-09-05 ENCOUNTER — Encounter: Payer: Self-pay | Admitting: Internal Medicine

## 2018-09-05 ENCOUNTER — Other Ambulatory Visit: Payer: Self-pay

## 2018-09-05 ENCOUNTER — Ambulatory Visit (INDEPENDENT_AMBULATORY_CARE_PROVIDER_SITE_OTHER): Payer: Medicare Other | Admitting: Internal Medicine

## 2018-09-05 VITALS — BP 124/70 | HR 81 | Temp 98.7°F | Ht 68.0 in | Wt 224.4 lb

## 2018-09-05 DIAGNOSIS — G4733 Obstructive sleep apnea (adult) (pediatric): Secondary | ICD-10-CM | POA: Diagnosis not present

## 2018-09-05 DIAGNOSIS — F17210 Nicotine dependence, cigarettes, uncomplicated: Secondary | ICD-10-CM

## 2018-09-05 HISTORY — DX: Nicotine dependence, cigarettes, uncomplicated: F17.210

## 2018-09-05 NOTE — Progress Notes (Signed)
Kyle Lewis, male    DOB: 02-20-1946,    MRN: [id number]   Brief patient profile:  4 yowm active smoker with onset insomnia "can't get to sleep" on side x 2015 > chodri eval place on cpap > slept better but then changed to bipap and just wanted to change sleep doctors due to communication issues with Chodri, actually satisfied he's sleeping better with bipap and denies every having hypersomnolence or poorly controlled hypertension previous to dx.      History of Present Illness  09/05/2018  Pulmonary/ 1st office eval/Anjelo Pullman  Chief Complaint  Patient presents with  . Consult    Consult for COPD. He has previously seen pulmonology but was not happy with their services. He to get established for sleep and COPD.   Dyspnea:  More limited by knee and back than breathing but still able to get MB flat and haul the trash to street Cough: not a problem  Sleep: fine as far  as he's aware / minimal daytime drowsiness / some p supper  SABA use: none   No  assoc excess/ purulent sputum or mucus plugs or hemoptysis or cp or chest tightness, subjective wheeze or overt sinus or hb symptoms.   Sleeping now without nocturnal  or early am exacerbation  of respiratory  c/o's or need for noct saba. Also denies any obvious fluctuation of symptoms with weather or environmental changes or other aggravating or alleviating factors except as outlined above   No unusual exposure hx or h/o childhood pna/ asthma or knowledge of premature birth.  Current Allergies, Complete Past Medical History, Past Surgical History, Family History, and Social History were reviewed in [employer] record.  ROS  The following are not active complaints unless bolded Hoarseness, sore throat, dysphagia, dental problems, itching, sneezing,  nasal congestion or discharge of excess mucus or purulent secretions, ear ache,   fever, chills, sweats, unintended wt loss or wt gain, classically pleuritic or exertional cp,   orthopnea pnd or arm/hand swelling  or leg swelling, presyncope, palpitations, abdominal pain, anorexia, nausea, vomiting, diarrhea  or change in bowel habits or change in bladder habits, change in stools or change in urine, dysuria, hematuria,  rash, arthralgias, visual complaints, headache, numbness, weakness or ataxia or problems with walking or coordination,  change in mood or  memory.           Past Medical History:  Diagnosis Date  . 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 Adenomatous colon polyp   . Adrenal mass ([address])    2 benign appearing left adrenal adenomas noted on 01/13/15 CT  . Allergy    seasonal  . Aortic stenosis    mild to moderate by echo and cath in 09/2016  . Arthritis   . BPH (benign prostatic hyperplasia)   . Carotid artery occlusion   . Cataract   . Chronic back pain   . COPD (chronic obstructive pulmonary disease) ([place])   . Coronary artery disease   . CVA (cerebral infarction)   . Diverticulosis   . Hypertension   . Nocturia   . Peripheral vascular disease ([place])   . Sleep apnea    wears CPAP  . Stroke [facility]) 2013   tia     Outpatient Medications Prior to Visit  Medication Sig Dispense Refill  . ALPRAZolam (XANAX) 0.5 MG tablet Take 0.5 mg by mouth at bedtime as needed for anxiety.    Marland Kitchen  HYDROcodone-acetaminophen (NORCO/VICODIN) 5-325 MG tablet Take 1 tablet by mouth every 6 (six) hours as needed for moderate pain.    Marland Kitchen loperamide (IMODIUM A-D) 2 MG capsule Take 2 mg by mouth.    . losartan-hydrochlorothiazide (HYZAAR) 100-25 MG per tablet Take 1 tablet by mouth daily.     . Tamsulosin HCl (FLOMAX) 0.4 MG CAPS Take 0.4 mg by mouth daily after supper.     Marland Kitchen tiZANidine (ZANAFLEX) 4 MG capsule Take 4 mg by mouth 3 (three) times daily as needed for muscle spasms.    . finasteride (PROSCAR) 5 MG tablet Take 5 mg by mouth daily.    Marland Kitchen amLODipine (NORVASC) 2.5 MG tablet Take 1 tablet (2.5 mg total) by mouth daily. Please  make yearly appt with Dr. Saunders Revel for May before anymore refills. 1st attempt (Patient not taking: Reported on 09/05/2018) 30 tablet 4  . atorvastatin (LIPITOR) 20 MG tablet Take 20 mg by mouth every evening.    . clopidogrel (PLAVIX) 75 MG tablet Take 1 tablet (75 mg total) by mouth daily. (Patient not taking: Reported on 09/05/2018) 90 tablet 0      Objective:     BP 124/70   Pulse 81   Temp 98.7 F (37.1 C) (Oral)   Ht 5\' 8"  (1.727 m)   Wt 224 lb 6.4 oz (101.8 kg)   SpO2 99%   BMI 34.12 kg/m   SpO2: 99 %  RA  Obese pleasant wm nad   HEENT: nl dentition, turbinates bilaterally, and oropharynx. Nl external ear canals without cough reflex Modified Mallampati Score =   1   NECK :  without JVD/Nodes/TM/ nl carotid upstrokes bilaterally   LUNGS: no acc muscle use,  Nl contour chest which is clear to A and P bilaterally without cough on insp or exp maneuvers   CV:  RRR  no s3 or murmur or increase in P2, and no edema   ABD:  Quite obese but  nontender with nl inspiratory excursion in the supine position. No bruits or organomegaly appreciated, bowel sounds nl  MS:  Nl gait/ ext warm without deformities, calf tenderness, cyanosis or clubbing No obvious joint restrictions   SKIN: warm and dry without lesions    NEURO:  alert, approp, nl sensorium with  no motor or cerebellar deficits apparent.     I personally reviewed radiology impression as follows:   Chest CT in care everywhere  05/09/18  Clear central tracheobronchial tree. No lung consolidation. Emphysema. Unchanged sub-5 mm right middle lobe pulmonary nodule (6:116). No new suspicious nodule.   HC03  02/01/18  = 28       Assessment   OSA (obstructive sleep apnea) Bipap per Chodri since ? 2018 - Download 09/05/2018 used > 4 h x > 92% of days and avg use 8 h 56min with AHI 3.1 @ 6 ipap and 10 epap    Pt appears to be compliant and benefiting from bipap but wants to establish here with sleep medicine > referred.  In  meantime,   Etiology and pathophysiology of osa including relationship to obesity reviewed in detail       Cigarette smoker Counseled re importance of smoking cessation but did not meet time criteria for separate billing     Ask about tobacco use:   ongoing Advise quitting    I reviewed the Fletcher curve with the patient that basically indicates  if you quit smoking when your best day FEV1 is still well preserved (as is clearly  the case here)  it is highly unlikely you will progress to severe disease and informed the patient there was  no medication on the market that has proven to alter the curve/ its downward trajectory  or the likelihood of progression of their disease(unlike other chronic medical conditions such as atheroclerosis where we do think we can change the natural hx with risk reducing meds)    Therefore stopping smoking and maintaining abstinence are  the most important aspects of care, not choice of inhalers or for that matter, doctors.   Treatment other than smoking cessation  is entirely directed by severity of symptoms and focused also on reducing exacerbations, not attempting to change the natural history of the disease.  Since no aecopd or limiting sob then no indication to treat at this time  Assess willingness:  Not committed at this point Assist in quit attempt:  Per PCP when ready Arrange follow up:   Follow up per Primary Care planned  For smoking cessation classes call [phone]         Total time devoted to counseling  > 50 % of initial 60 min office visit:  review case with pt/ reviewed download info from his bipap with him/ discussion of options/alternatives/ personally creating written customized instructions  in presence of pt  then going over those specific  Instructions directly with the pt including how to use all of the meds but in particular covering each new medication in detail and the difference between the maintenance= "automatic" meds and the prns  using an action plan format for the latter (If this problem/symptom => do that organization reading Left to right).  Please see AVS from this visit for a full list of these instructions which I personally wrote for this pt and  are unique to this visit.        Christinia Gully, MD 09/05/2018

## 2018-09-05 NOTE — Assessment & Plan Note (Signed)
Bipap per Chodri since ? 2018 - Download 09/05/2018 used > 4 h x > 92% of days and avg use 8 h 53min with AHI 3.1 @ 6 ipap and 10 epap    Pt appears to be compliant and benefiting from bipap but wants to establish here with sleep medicine > referred.  In meantime,   Etiology and pathophysiology of osa including relationship to obesity reviewed in detail

## 2018-09-05 NOTE — Patient Instructions (Signed)
We will  let [facility] know you are seeing [place] and provide you with whatever you need pending an appointment (1st available) with one of our sleep medicine doctors.

## 2018-09-05 NOTE — Assessment & Plan Note (Signed)
Counseled re importance of smoking cessation but did not meet time criteria for separate billing    4-5 min discussion re active cigarette smoking in addition to office E&M  Ask about tobacco use:   ongoing Advise quitting    I reviewed the Fletcher curve with the patient that basically indicates  if you quit smoking when your best day FEV1 is still well preserved (as is clearly  the case here)  it is highly unlikely you will progress to severe disease and informed the patient there was  no medication on the market that has proven to alter the curve/ its downward trajectory  or the likelihood of progression of their disease(unlike other chronic medical conditions such as atheroclerosis where we do think we can change the natural hx with risk reducing meds)    Therefore stopping smoking and maintaining abstinence are  the most important aspects of care, not choice of inhalers or for that matter, doctors.   Treatment other than smoking cessation  is entirely directed by severity of symptoms and focused also on reducing exacerbations, not attempting to change the natural history of the disease.  Since no aecopd or limiting sob then no indication to treat at this time  Assess willingness:  Not committed at this point Assist in quit attempt:  Per PCP when ready Arrange follow up:   Follow up per Primary Care planned  For smoking cessation classes call [phone]       Total time devoted to counseling  > 50 % of initial 60 min office visit:  review case with pt/ reviewed download info from his bipap with him/ discussion of options/alternatives/ personally creating written customized instructions  in presence of pt  then going over those specific  Instructions directly with the pt including how to use all of the meds but in particular covering each new medication in detail and the difference between the maintenance= "automatic" meds and the prns using an action plan format for the latter (If this  problem/symptom => do that organization reading Left to right).  Please see AVS from this visit for a full list of these instructions which I personally wrote for this pt and  are unique to this visit.

## 2018-09-10 ENCOUNTER — Other Ambulatory Visit: Payer: Self-pay

## 2018-09-10 ENCOUNTER — Encounter: Payer: Self-pay | Admitting: Podiatry

## 2018-09-10 ENCOUNTER — Ambulatory Visit (INDEPENDENT_AMBULATORY_CARE_PROVIDER_SITE_OTHER): Payer: Medicare Other | Admitting: Podiatry

## 2018-09-10 VITALS — BP 162/85 | HR 82 | Temp 97.5°F | Resp 16

## 2018-09-10 DIAGNOSIS — D689 Coagulation defect, unspecified: Secondary | ICD-10-CM | POA: Diagnosis not present

## 2018-09-10 DIAGNOSIS — M79676 Pain in unspecified toe(s): Secondary | ICD-10-CM

## 2018-09-10 DIAGNOSIS — B351 Tinea unguium: Secondary | ICD-10-CM

## 2018-09-10 NOTE — Progress Notes (Signed)
Subjective:  Patient ID: Kyle Lewis, male    DOB: 16-Jun-1945,  MRN: [id number]  Chief Complaint  Patient presents with  . debride    BL nail trimmign -pt still on Plavix -pt deneis any issues/concern    73 y.o. male presents with the above complaint.  Reports painfully elongated nails to both feet. On plavix  Review of Systems: Negative except as noted in the HPI. Denies N/V/F/Ch.  Past Medical History:  Diagnosis Date  . 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 Adenomatous colon polyp   . Adrenal mass ([place])    2 benign appearing left adrenal adenomas noted on 01/13/15 CT  . Allergy    seasonal  . Aortic stenosis    mild to moderate by echo and cath in 09/2016  . Arthritis   . BPH (benign prostatic hyperplasia)   . Carotid artery occlusion   . Cataract   . Chronic back pain   . COPD (chronic obstructive pulmonary disease) ([place])   . Coronary artery disease   . CVA (cerebral infarction)   . Diverticulosis   . Hypertension   . Nocturia   . Peripheral vascular disease ([place])   . Sleep apnea    wears CPAP  . Stroke [facility]) 2013   tia     Current Outpatient Medications:  .  ALPRAZolam (XANAX) 0.5 MG tablet, Take 0.5 mg by mouth at bedtime as needed for anxiety., Disp: , Rfl:  .  clopidogrel (PLAVIX) 300 MG TABS tablet, Take 300 mg by mouth once., Disp: , Rfl:  .  finasteride (PROSCAR) 5 MG tablet, Take 5 mg by mouth daily., Disp: , Rfl:  .  HYDROcodone-acetaminophen (NORCO/VICODIN) 5-325 MG tablet, Take 1 tablet by mouth every 6 (six) hours as needed for moderate pain., Disp: , Rfl:  .  loperamide (IMODIUM A-D) 2 MG capsule, Take 2 mg by mouth., Disp: , Rfl:  .  losartan-hydrochlorothiazide (HYZAAR) 100-25 MG per tablet, Take 1 tablet by mouth daily. , Disp: , Rfl:  .  oxyCODONE-acetaminophen (PERCOCET/ROXICET) 5-325 MG tablet, oxycodone-acetaminophen 5 mg-325 mg tablet, Disp: , Rfl:  .  Tamsulosin HCl (FLOMAX) 0.4 MG CAPS, Take  0.4 mg by mouth daily after supper. , Disp: , Rfl:  .  tiZANidine (ZANAFLEX) 4 MG capsule, Take 4 mg by mouth 3 (three) times daily as needed for muscle spasms., Disp: , Rfl:  .  tiZANidine (ZANAFLEX) 4 MG tablet, , Disp: , Rfl:   Social History   Tobacco Use  Smoking Status Current Every Day Smoker  . Packs/day: 0.75  . Years: 40.00  . Pack years: 30.00  . Types: Cigarettes  Smokeless Tobacco Never Used  Tobacco Comment   1/2 pack per day 09/05/18 BB LPN    Allergies  Allergen Reactions  . Cefazolin Rash    The patient had surgery and was given cefazolin intraop. ~ 10 days later he developed a rash confirmed by biopsy to be consistent w/ drug eruption. We cannot know for sure, but this is the most likely agent.     Objective:   Vitals:   09/10/18 1313  BP: (!) 162/85  Pulse: 82  Resp: 16  Temp: (!) 97.5 F (36.4 C)   There is no height or weight on file to calculate BMI. Constitutional Well developed. Well nourished.  Vascular Dorsalis pedis pulses palpable bilaterally. Posterior tibial pulses palpable bilaterally. Capillary refill normal to all digits.  No cyanosis or  clubbing noted. Pedal hair growth normal.  Neurologic Normal speech. Oriented to person, place, and time. Epicritic sensation to light touch grossly present bilaterally.  Dermatologic Nails elongated dystrophic pain to palpation No open wounds. No skin lesions.  Orthopedic: Normal joint ROM without pain or crepitus bilaterally. No visible deformities. No bony tenderness.   Radiographs: None Assessment:   1. 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  Onychomycosis with coagulation defect. -Nails palliatively debridement as below -Educated on self-care  Procedure: Nail Debridement Rationale: Pain Type of Debridement: manual, sharp debridement. Instrumentation: Nail nipper, rotary burr. Number of Nails: 10     Return in about 9 weeks (around 11/12/2018).

## 2018-09-16 ENCOUNTER — Encounter: Payer: Self-pay | Admitting: Internal Medicine

## 2018-09-19 ENCOUNTER — Encounter: Payer: Self-pay | Admitting: Internal Medicine

## 2018-09-19 ENCOUNTER — Other Ambulatory Visit: Payer: Self-pay

## 2018-09-19 ENCOUNTER — Ambulatory Visit (INDEPENDENT_AMBULATORY_CARE_PROVIDER_SITE_OTHER): Payer: Medicare Other | Admitting: Internal Medicine

## 2018-09-19 VITALS — BP 136/78 | HR 59 | Ht 68.0 in | Wt 228.0 lb

## 2018-09-19 DIAGNOSIS — G4733 Obstructive sleep apnea (adult) (pediatric): Secondary | ICD-10-CM | POA: Diagnosis not present

## 2018-09-19 DIAGNOSIS — Z720 Tobacco use: Secondary | ICD-10-CM | POA: Diagnosis not present

## 2018-09-19 NOTE — Patient Instructions (Signed)
[facility]- Continue BIPAP 10/6, mask of choice, humidifier, supplies, AirView/ card  Mr Archila- please try hard to stop smoking. You can do it and you know it is important.   Please call if we can help

## 2018-09-19 NOTE — Progress Notes (Signed)
09/19/2018- 5 yoM smoker with hx OSA for sleep evalutation. referred by Dr Melvyn Novas for OSA, Complicating medical problems COPD, moderate AS, R MCA CVA, PVD, BPH  DME [facility], uses bipap, denies problems Previously w Dr Alcide Clever NPSG 10/06/17- AHI 84.6/ hr, desaturation mean 89.3%, body weight 236 lbs BIPAP Max IPAP 25, Min EPAP 5, Min PS 4. 90% of time I 10.4/ E 6.4 Download  100% compliance, AHI 2.3/ hr CXR 01/12/18-Vascular congestion noted. Mildly increased interstitial markings may reflect mild interstitial edema.  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  Sleeps much better with BIPAP. Needs to reestablish for supplies.  Denies active breathing problems- no cough, wheeze or unusual dyspnea despite smoking.   Prior to Admission medications   Medication Sig Start Date End Date Taking? Authorizing Provider  ALPRAZolam Duanne Moron) 0.5 MG tablet Take 0.5 mg by mouth at bedtime as needed for anxiety.   Yes [provider]  clopidogrel (PLAVIX) 300 MG TABS tablet Take 300 mg by mouth once.   Yes [provider]  finasteride (PROSCAR) 5 MG tablet Take 5 mg by mouth daily.   Yes [provider]  HYDROcodone-acetaminophen (NORCO/VICODIN) 5-325 MG tablet Take 1 tablet by mouth every 6 (six) hours as needed for moderate pain.   Yes [provider]  losartan-hydrochlorothiazide (HYZAAR) 100-25 MG per tablet Take 1 tablet by mouth daily.    Yes [provider]  oxyCODONE-acetaminophen (PERCOCET/ROXICET) 5-325 MG tablet oxycodone-acetaminophen 5 mg-325 mg tablet   Yes [provider]  Tamsulosin HCl (FLOMAX) 0.4 MG CAPS Take 0.4 mg by mouth daily after supper.  06/07/11  Yes [provider]  tiZANidine (ZANAFLEX) 4 MG capsule Take 4 mg by mouth 3 (three) times daily as  needed for muscle spasms.   Yes [provider]  loperamide (IMODIUM A-D) 2 MG capsule Take 2 mg by mouth.    [provider]   Past Medical History:  Diagnosis Date  . 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 Adenomatous colon polyp   . Adrenal mass ([place])    2 benign appearing left adrenal adenomas noted on 01/13/15 CT  . Allergy    seasonal  . Aortic stenosis    mild to moderate by echo and cath in 09/2016  . Arthritis   . BPH (benign prostatic hyperplasia)   . Carotid artery occlusion   . Cataract   . Chronic back pain   . COPD (chronic obstructive pulmonary disease) ([place])   . Coronary artery disease   . CVA (cerebral infarction)   . Diverticulosis   . Hypertension   . Nocturia   . Peripheral vascular disease ([place])   . Sleep apnea    wears CPAP  . Stroke [facility]) 2013   tia    Past Surgical History:  Procedure Laterality Date  . APPENDECTOMY    . BRAIN MENINGIOMA EXCISION     menigioma  . CARPAL TUNNEL RELEASE     left  . cataracts     both eyes  . EYE SURGERY Bilateral    Cataract  . INGUINAL HERNIA REPAIR    . JOINT REPLACEMENT Left 04-01-12   Knee  . LEFT HEART CATH AND CORONARY ANGIOGRAPHY N/A 09/15/2016   Procedure: Left Heart Cath and Coronary  Angiography;  Surgeon: Nelva Bush, MD;  Location: [place] CV LAB;  Service: Cardiovascular;  Laterality: N/A;  . MAXIMUM ACCESS (MAS)POSTERIOR LUMBAR INTERBODY FUSION (PLIF) 1 LEVEL N/A 07/07/2015   Procedure:  POSTERIOR LUMBAR INTERBODY FUSION (PLIF) Lumbar Four-Five with Pedicle Screw Fixation Lumbar Two-Five;Laminectomy Lumbar Two-Five;  Surgeon: Eustace Moore, MD;  Location: [place] NEURO ORS;  Service: Neurosurgery;  Laterality: N/A;   POSTERIOR LUMBAR INTERBODY FUSION (PLIF) Lumbar Four-Five with Pedicle Screw Fixation Lumbar Two-Five;Laminectomy Lumbar Two-Five  . PROSTATE SURGERY    . RIGHT/LEFT HEART CATH AND CORONARY ANGIOGRAPHY N/A 01/24/2018    Procedure: RIGHT/LEFT HEART CATH AND CORONARY ANGIOGRAPHY;  Surgeon: Nelva Bush, MD;  Location: [place] CV LAB;  Service: Cardiovascular;  Laterality: N/A;  . TONSILLECTOMY    . TOTAL KNEE ARTHROPLASTY  04/01/2012   Procedure: TOTAL KNEE ARTHROPLASTY;  Surgeon: Gearlean Alf, MD;  Location: [facility] OR[facility];  Service: Orthopedics;  Laterality: Left;  . TRANSURETHRAL RESECTION OF PROSTATE    . UMBILICAL HERNIA REPAIR  2011   Family History  Problem Relation Age of Onset  . Heart disease Mother        Onset ~85 y/o  . Hypertension Mother   . Hyperlipidemia Mother   . Diabetes Father   . Heart attack Father   . Heart disease Father        CABG at age 66  . Prostate cancer Maternal Grandfather   . Colon cancer Neg Hx   . Esophageal cancer Neg Hx   . Rectal cancer Neg Hx   . Stomach cancer Neg Hx    Social History   Socioeconomic History  . Marital status: Married    Spouse name: [name]   . Number of children: 2  . Years of education: College   . Highest education level: Not on file  Occupational History    Comment: Self employed Administrator   Social Needs  . Financial resource strain: Not on file  . Food insecurity:    Worry: Not on file    Inability: Not on file  . Transportation needs:    Medical: Not on file    Non-medical: Not on file  Tobacco Use  . Smoking status: Current Every Day Smoker    Packs/day: 0.75    Years: 40.00    Pack years: 30.00    Types: Cigarettes  . Smokeless tobacco: Never Used  . Tobacco comment: 1/2 pack per day   Substance and Sexual Activity  . Alcohol use: Yes    Comment: rare  . Drug use: No  . Sexual activity: Not on file  Lifestyle  . Physical activity:    Days per week: Not on file    Minutes per session: Not on file  . Stress: Not on file  Relationships  . Social connections:    Talks on phone: Not on file    Gets together: Not on file    Attends religious service: Not on file    Active member of club or organization:  Not on file    Attends meetings of clubs or organizations: Not on file    Relationship status: Not on file  . Intimate partner violence:    Fear of current or ex partner: Not on file    Emotionally abused: Not on file    Physically abused: Not on file    Forced sexual activity: Not on file  Other Topics Concern  . Not on file  Social History Narrative   Patient  lives at home with his wife. Rhonda    Patient has a Financial risk analyst.    Patient has 2 sons.    Patient smokes a half pack a day.    ROS-see HPI  + = positive Constitutional:    weight loss, night sweats, fevers, chills, fatigue, lassitude. HEENT:    headaches, difficulty swallowing, tooth/dental problems, sore throat,       +sneezing, itching, ear ache, nasal congestion, post nasal drip, snoring CV:    chest pain, orthopnea, PND, swelling in lower extremities, anasarca,                                  dizziness, palpitations Resp:   +shortness of breath with exertion or at rest.                productive cough,   non-productive cough, coughing up of blood.              change in color of mucus.  wheezing.   Skin:    rash or lesions. GI:  No-   heartburn, indigestion, abdominal pain, nausea, vomiting, diarrhea,                 change in bowel habits, loss of appetite GU: dysuria, change in color of urine, no urgency or frequency.   flank pain. MS:  + joint pain, stiffness, decreased range of motion, back pain. Neuro-     nothing unusual Psych:  change in mood or affect.  depression or anxiety.   memory loss.  OBJ- Physical Exam General- Alert, Oriented, Affect-appropriate, Distress- none acute, + obese Skin- rash-none, lesions- none, excoriation- none Lymphadenopathy- none Head- atraumatic            Eyes- Gross vision intact, PERRLA, conjunctivae and secretions clear            Ears- Hearing, canals-normal            Nose- Clear, no-Septal dev, mucus, polyps, erosion, perforation             Throat- Mallampati  II-III , mucosa clear , drainage- none, tonsils- atrophic, own teeth Neck- flexible , trachea midline, no stridor , thyroid nl, carotid no bruit Chest - symmetrical excursion , unlabored           Heart/CV- RRR , no murmur , no gallop  , no rub, nl s1 s2                           - JVD- none , edema- none, stasis changes- none, varices- none           Lung- clear to P&A, wheeze- none, cough- none , dullness-none, rub- none           Chest wall-  Abd-  Br/ Gen/ Rectal- Not done, not indicated Extrem- cyanosis- none, clubbing, none, atrophy- none, strength- nl Neuro- grossly intact to observation

## 2018-09-23 ENCOUNTER — Encounter: Payer: Self-pay | Admitting: Internal Medicine

## 2018-09-23 NOTE — Assessment & Plan Note (Signed)
He clearly benefits from BIPAP and appreciates the importance, given his cardiovascular disease. Download confirms good compliance and control. Plan- Continue BIPAP Max IPAP 25, Min EPAP 5, Min PS 4. 90% of time I 10.4/ E 6.4

## 2018-09-23 NOTE — Assessment & Plan Note (Signed)
Counseled, emphasizing his cardiovascular ddisease. He is not ready to try to quit. Encouraged to reconsider.

## 2018-10-21 ENCOUNTER — Telehealth: Payer: Self-pay | Admitting: Internal Medicine

## 2018-10-21 NOTE — Telephone Encounter (Signed)
° °  [facility] Pre-operative Risk Assessment    Request for surgical clearance:  1. What type of surgery is being performed? RT Reverse total shoulder    2. When is this surgery scheduled? 11/21/2018   3. What type of clearance is required (medical clearance vs. Pharmacy clearance to hold med vs. Both)? both  4. Are there any medications that need to be held prior to surgery and how long? Plavix instructions   5. Practice name and name of physician performing surgery? [facility], Dr Lennette Bihari Supple  6. What is your office phone number [phone] - Kyle Lewis    7.   What is your office fax number [phone]  8.   Anesthesia type (None, local, MAC, general) ? general   Kyle Lewis 10/21/2018, 2:34 PM  _________________________________________________________________   (provider comments below)

## 2018-10-21 NOTE — Telephone Encounter (Signed)
   Primary Cardiologist:Rajan R Revankar, MD  Chart reviewed as part of pre-operative protocol coverage. Because of Kyle Lewis's past medical history and time since last visit, he/she will require a follow-up visit in order to better assess preoperative cardiovascular risk.  Pre-op covering staff: - Please schedule appointment and call patient to inform them.  Please make an appt with Dr. Geraldo Pitter for cardiac clearance. Surgery is schedule on 11/21/18 and he may need to hold plavix for 7 days. Please schedule prior to 11/13/18.  - Please contact requesting surgeon's office via preferred method (i.e, phone, fax) to inform them of need for appointment prior to surgery.  If applicable, this message will also be routed to pharmacy pool and/or primary cardiologist for input on holding anticoagulant/antiplatelet agent as requested below so that this information is available at time of patient's appointment.   Ledora Bottcher, PA  10/21/2018, 5:54 PM

## 2018-10-22 ENCOUNTER — Telehealth: Payer: Self-pay

## 2018-10-22 NOTE — Telephone Encounter (Signed)
Called patient to schedule for cardiac clearance virtual visit.   Received phone consent for telemedicine visit. Patient instructed to have home BP cuff available, weigh himself and all medication out for reconciliation/refills. Patient scheduled for virtual visit.  Request sent to Dr. Asher Muir office for most recent labs

## 2018-10-22 NOTE — Telephone Encounter (Signed)
Patient has been scheduled

## 2018-10-23 ENCOUNTER — Telehealth: Payer: Self-pay | Admitting: Cardiology

## 2018-10-23 NOTE — Telephone Encounter (Signed)
Virtual Visit Pre-Appointment Phone Call  "(Name), I am calling you today to discuss your upcoming appointment. We are currently trying to limit exposure to the virus that causes COVID-19 by seeing patients at home rather than in the office."  1. "What is the BEST phone number to call the day of the visit?" - include this in appointment notes  2. Do you have or have access to (through a family member/friend) a smartphone with video capability that we can use for your visit?" a. If yes - list this number in appt notes as cell (if different from BEST phone #) and list the appointment type as a VIDEO visit in appointment notes b. If no - list the appointment type as a PHONE visit in appointment notes  Confirm consent - "In the setting of the current Covid19 crisis, you are scheduled for a (phone or video) visit with your provider on (date) at (time).  Just as we do with many in-office visits, in order for you to participate in this visit, we must obtain consent.  If you'd like, I can send this to your mychart (if signed up) or email for you to review.  Otherwise, I can obtain your verbal consent now.  All virtual visits are billed to your insurance company just like a normal visit would be.  By agreeing to a virtual visit, we'd like you to understand that the technology does not allow for your provider to perform an examination, and thus may limit your provider's ability to fully assess your condition. If your provider identifies any concerns that need to be evaluated in person, we will make arrangements to do so.  Finally, though the technology is pretty good, we cannot assure that it will always work on either your or our end, and in the setting of a video visit, we may have to convert it to a phone-only visit.  In either situation, we cannot ensure that we have a secure connection.  Are you willing to proceed?" STAFF: Did the patient verbally acknowledge consent to telehealth visit? Document  YES/NO here: YES 3. Advise patient to be prepared - "Two hours prior to your appointment, go ahead and check your blood pressure, pulse, oxygen saturation, and your weight (if you have the equipment to check those) and write them all down. When your visit starts, your provider will ask you for this information. If you have an Apple Watch or Kardia device, please plan to have heart rate information ready on the day of your appointment. Please have a pen and paper handy nearby the day of the visit as well."  4. Give patient instructions for MyChart download to smartphone OR Doximity/Doxy.me as below if video visit (depending on what platform provider is using)  5. Inform patient they will receive a phone call 15 minutes prior to their appointment time (may be from unknown caller ID) so they should be prepared to answer    TELEPHONE CALL NOTE  Kyle Lewis has been deemed a candidate for a follow-up tele-health visit to limit community exposure during the Covid-19 pandemic. I spoke with the patient via phone to ensure availability of phone/video source, confirm preferred email & phone number, and discuss instructions and expectations.  I reminded JAKHARI SPACE to be prepared with any vital sign and/or heart rhythm information that could potentially be obtained via home monitoring, at the time of his visit. I reminded KAEVION SINCLAIR to expect a phone call prior to his visit.  Frederic Jericho 10/23/2018 2:37 PM   INSTRUCTIONS FOR DOWNLOADING THE MYCHART APP TO SMARTPHONE  - The patient must first make sure to have activated MyChart and know their login information - If Apple, go to [employer] and type in MyChart in the search bar and download the app. If Android, ask patient to go to [employer] and type in [place] in the search bar and download the app. The app is free but as with any other app downloads, their phone may require them to verify saved payment information or Apple/Android  password.  - The patient will need to then log into the app with their MyChart username and password, and select [place] as their healthcare provider to link the account. When it is time for your visit, go to the MyChart app, find appointments, and click Begin Video Visit. Be sure to Select Allow for your device to access the Microphone and Camera for your visit. You will then be connected, and your provider will be with you shortly.  **If they have any issues connecting, or need assistance please contact MyChart service desk [phone])**  **If using a computer, in order to ensure the best quality for their visit they will need to use either of the following Internet Browsers: [employer], or Google Chrome**  IF USING DOXIMITY or DOXY.ME - The patient will receive a link just prior to their visit by text.     FULL LENGTH CONSENT FOR TELE-HEALTH VISIT   I hereby voluntarily request, consent and authorize [facility] and its employed or contracted physicians, physician assistants, nurse practitioners or other licensed health care professionals (the Practitioner), to provide me with telemedicine health care services (the Services") as deemed necessary by the treating Practitioner. I acknowledge and consent to receive the Services by the Practitioner via telemedicine. I understand that the telemedicine visit will involve communicating with the Practitioner through live audiovisual communication technology and the disclosure of certain medical information by electronic transmission. I acknowledge that I have been given the opportunity to request an in-person assessment or other available alternative prior to the telemedicine visit and am voluntarily participating in the telemedicine visit.  I understand that I have the right to withhold or withdraw my consent to the use of telemedicine in the course of my care at any time, without affecting my right to future care or treatment,  and that the Practitioner or I may terminate the telemedicine visit at any time. I understand that I have the right to inspect all information obtained and/or recorded in the course of the telemedicine visit and may receive copies of available information for a reasonable fee.  I understand that some of the potential risks of receiving the Services via telemedicine include:   Delay or interruption in medical evaluation due to technological equipment failure or disruption;  Information transmitted may not be sufficient (e.g. poor resolution of images) to allow for appropriate medical decision making by the Practitioner; and/or   In rare instances, security protocols could fail, causing a breach of personal health information.  Furthermore, I acknowledge that it is my responsibility to provide information about my medical history, conditions and care that is complete and accurate to the best of my ability. I acknowledge that Practitioner's advice, recommendations, and/or decision may be based on factors not within their control, such as incomplete or inaccurate data provided by me or distortions of diagnostic images or specimens that may result from electronic transmissions. I understand that the  practice of medicine is not an Chief Strategy Officer and that Practitioner makes no warranties or guarantees regarding treatment outcomes. I acknowledge that I will receive a copy of this consent concurrently upon execution via email to the email address I last provided but may also request a printed copy by calling the office of [facility].    I understand that my insurance will be billed for this visit.   I have read or had this consent read to me.  I understand the contents of this consent, which adequately explains the benefits and risks of the Services being provided via telemedicine.   I have been provided ample opportunity to ask questions regarding this consent and the Services and have had my questions  answered to my satisfaction.  I give my informed consent for the services to be provided through the use of telemedicine in my medical care  By participating in this telemedicine visit I agree to the above.

## 2018-10-24 ENCOUNTER — Other Ambulatory Visit: Payer: Self-pay

## 2018-10-24 ENCOUNTER — Telehealth (INDEPENDENT_AMBULATORY_CARE_PROVIDER_SITE_OTHER): Payer: Medicare Other | Admitting: Cardiology

## 2018-10-24 ENCOUNTER — Encounter: Payer: Self-pay | Admitting: Cardiology

## 2018-10-24 VITALS — BP 137/85 | HR 67 | Temp 97.6°F | Ht 68.0 in | Wt 227.4 lb

## 2018-10-24 DIAGNOSIS — I2510 Atherosclerotic heart disease of native coronary artery without angina pectoris: Secondary | ICD-10-CM

## 2018-10-24 DIAGNOSIS — I350 Nonrheumatic aortic (valve) stenosis: Secondary | ICD-10-CM

## 2018-10-24 DIAGNOSIS — I714 Abdominal aortic aneurysm, without rupture, unspecified: Secondary | ICD-10-CM

## 2018-10-24 DIAGNOSIS — Z01810 Encounter for preprocedural cardiovascular examination: Secondary | ICD-10-CM

## 2018-10-24 DIAGNOSIS — G4733 Obstructive sleep apnea (adult) (pediatric): Secondary | ICD-10-CM

## 2018-10-24 DIAGNOSIS — Z8673 Personal history of transient ischemic attack (TIA), and cerebral infarction without residual deficits: Secondary | ICD-10-CM | POA: Diagnosis not present

## 2018-10-24 DIAGNOSIS — I10 Essential (primary) hypertension: Secondary | ICD-10-CM

## 2018-10-24 DIAGNOSIS — E6609 Other obesity due to excess calories: Secondary | ICD-10-CM

## 2018-10-24 HISTORY — DX: Encounter for preprocedural cardiovascular examination: Z01.810

## 2018-10-24 NOTE — Patient Instructions (Signed)
Per Dr.Revankar, copy of today's office note was sent to Dr. Justice Britain for surgical clearance.  Medication Instructions:  Your physician recommends that you continue on your current medications as directed. Please refer to the Current Medication list given to you today.  If you need a refill on your cardiac medications before your next appointment, please call your pharmacy.   Lab work: NONE If you have labs (blood work) drawn today and your tests are completely normal, you will receive your results only by: Marland Kitchen MyChart Message (if you have MyChart) OR . A paper copy in the mail If you have any lab test that is abnormal or we need to change your treatment, we will call you to review the results.  Testing/Procedures: NONE  Follow-Up: At [facility], you and your health needs are our priority.  As part of our continuing mission to provide you with exceptional heart care, we have created designated Provider Care Teams.  These Care Teams include your primary Cardiologist (physician) and Advanced Practice Providers (APPs -  Physician Assistants and Nurse Practitioners) who all work together to provide you with the care you need, when you need it. You will need a follow up appointment in 2 weeks.

## 2018-10-24 NOTE — Progress Notes (Signed)
Virtual Visit via Video Note   This visit type was conducted due to national recommendations for restrictions regarding the COVID-19 Pandemic (e.g. social distancing) in an effort to limit this patient's exposure and mitigate transmission in our community.  Due to his co-morbid illnesses, this patient is at least at moderate risk for complications without adequate follow up.  This format is felt to be most appropriate for this patient at this time.  All issues noted in this document were discussed and addressed.  A limited physical exam was performed with this format.  Please refer to the patient's chart for his consent to telehealth for [facility].   Date:  10/24/2018   ID:  Kyle Lewis, DOB 1945-08-26, MRN [id number]  Patient Location: Home Provider Location: Home  PCP:  [name], MD  Cardiologist:  Jenean Lindau, MD  Electrophysiologist:  None   Evaluation Performed:  Follow-Up Visit  Chief Complaint: Preoperative cardiovascular risk stratification  History of Present Illness:    Kyle Lewis is a 73 y.o. male with past medical history of coronary artery disease which is nonobstructive by recent coronary angiography and moderate aortic stenosis by echocardiogram and coronary angiography.  Patient leads a sedentary lifestyle.  He denies any problems at this time and takes care of activities of daily living.  No chest pain orthopnea PND syncope or dyspnea on exertion.  At the time of my evaluation, the patient is alert awake oriented and in no distress.  He is planning to undergo shoulder surgery.  The patient does not have symptoms concerning for COVID-19 infection (fever, chills, cough, or new shortness of breath).    Past Medical History:  Diagnosis Date  . 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 Adenomatous colon polyp   . Adrenal mass ([place])    2 benign appearing left adrenal adenomas noted on 01/13/15 CT  .  Allergy    seasonal  . Aortic stenosis    mild to moderate by echo and cath in 09/2016  . Arthritis   . BPH (benign prostatic hyperplasia)   . Carotid artery occlusion   . Cataract   . Chronic back pain   . COPD (chronic obstructive pulmonary disease) ([place])   . Coronary artery disease   . CVA (cerebral infarction)   . Diverticulosis   . Hypertension   . Nocturia   . Peripheral vascular disease ([place])   . Sleep apnea    wears CPAP  . Stroke [facility]) 2013   tia    Past Surgical History:  Procedure Laterality Date  . APPENDECTOMY    . BRAIN MENINGIOMA EXCISION     menigioma  . CARPAL TUNNEL RELEASE     left  . cataracts     both eyes  . EYE SURGERY Bilateral    Cataract  . INGUINAL HERNIA REPAIR    . JOINT REPLACEMENT Left 04-01-12   Knee  . LEFT HEART CATH AND CORONARY ANGIOGRAPHY N/A 09/15/2016   Procedure: Left Heart Cath and Coronary Angiography;  Surgeon: Nelva Bush, MD;  Location: [place] CV LAB;  Service: Cardiovascular;  Laterality: N/A;  . MAXIMUM ACCESS (MAS)POSTERIOR LUMBAR INTERBODY FUSION (PLIF) 1 LEVEL N/A 07/07/2015   Procedure:  POSTERIOR LUMBAR INTERBODY FUSION (PLIF) Lumbar Four-Five with Pedicle Screw Fixation Lumbar Two-Five;Laminectomy Lumbar Two-Five;  Surgeon: Eustace Moore, MD;  Location: [place] [facility]UR[facility] ORS;  Service: Neurosurgery;  Laterality: N/A;   POSTERIOR LUMBAR INTERBODY  FUSION (PLIF) Lumbar Four-Five with Pedicle Screw Fixation Lumbar Two-Five;Laminectomy Lumbar Two-Five  . PROSTATE SURGERY    . RIGHT/LEFT HEART CATH AND CORONARY ANGIOGRAPHY N/A 01/24/2018   Procedure: RIGHT/LEFT HEART CATH AND CORONARY ANGIOGRAPHY;  Surgeon: Nelva Bush, MD;  Location: [place] CV LAB;  Service: Cardiovascular;  Laterality: N/A;  . TONSILLECTOMY    . TOTAL KNEE ARTHROPLASTY  04/01/2012   Procedure: TOTAL KNEE ARTHROPLASTY;  Surgeon: Gearlean Alf, MD;  Location: [facility];  Service: Orthopedics;  Laterality: Left;  . TRANSURETHRAL RESECTION OF PROSTATE    .  UMBILICAL HERNIA REPAIR  2011     Current Meds  Medication Sig  . ALPRAZolam (XANAX) 0.5 MG tablet Take 0.5 mg by mouth at bedtime as needed for anxiety.  . clopidogrel (PLAVIX) 300 MG TABS tablet Take 300 mg by mouth daily.   . finasteride (PROSCAR) 5 MG tablet Take 5 mg by mouth daily.  . hydrochlorothiazide (HYDRODIURIL) 25 MG tablet Take 25 mg by mouth daily.  Marland Kitchen HYDROcodone-acetaminophen (NORCO/VICODIN) 5-325 MG tablet Take 1 tablet by mouth every 6 (six) hours as needed for moderate pain.  Marland Kitchen loperamide (IMODIUM A-D) 2 MG capsule Take 2 mg by mouth as needed.   Marland Kitchen losartan (COZAAR) 100 MG tablet Take 1 tablet by mouth daily.  Marland Kitchen losartan-hydrochlorothiazide (HYZAAR) 100-25 MG per tablet Take 1 tablet by mouth daily.   Marland Kitchen olmesartan (BENICAR) 40 MG tablet Take 40 mg by mouth daily.  . Tamsulosin HCl (FLOMAX) 0.4 MG CAPS Take 0.4 mg by mouth daily after supper.   Marland Kitchen tiZANidine (ZANAFLEX) 4 MG capsule Take 4 mg by mouth 3 (three) times daily as needed for muscle spasms.     Allergies:   Cefazolin   Social History   Tobacco Use  . Smoking status: Current Every Day Smoker    Packs/day: 0.75    Years: 40.00    Pack years: 30.00    Types: Cigarettes  . Smokeless tobacco: Never Used  . Tobacco comment: 1/2 pack per day   Substance Use Topics  . Alcohol use: Yes    Comment: rare  . Drug use: No     Family Hx: The patient's family history includes Diabetes in his father; Heart attack in his father; Heart disease in his father and mother; Hyperlipidemia in his mother; Hypertension in his mother; Prostate cancer in his maternal grandfather. There is no history of Colon cancer, Esophageal cancer, Rectal cancer, or Stomach cancer.  ROS:   Please see the history of present illness.    As above All other systems reviewed and are negative.   Prior CV studies:   The following studies were reviewed today:  Echocardiogram and coronary angiography and left heart catheterization was  reviewed and discussed with patient  Labs/Other Tests and Data Reviewed:    EKG:  No ECG reviewed.  Recent Labs: 01/17/2018: Hemoglobin 13.1; Platelets 357 02/01/2018: ALT 12; BUN 13; Creatinine, Ser 0.70; Potassium 4.3; Sodium 139   Recent Lipid Panel Lab Results  Component Value Date/Time   CHOL 144 02/01/2018 11:01 AM   TRIG 92 02/01/2018 11:01 AM   HDL 48 02/01/2018 11:01 AM   CHOLHDL 3.0 02/01/2018 11:01 AM   CHOLHDL 4.0 09/15/2016 08:24 AM   LDLCALC 78 02/01/2018 11:01 AM    Wt Readings from Last 3 Encounters:  10/24/18 227 lb 6.4 oz (103.1 kg)  09/19/18 228 lb (103.4 kg)  09/05/18 224 lb 6.4 oz (101.8 kg)     Objective:  Vital Signs:  BP 137/85 (BP Location: Right Arm, Patient Position: Sitting, Cuff Size: Normal)   Pulse 67   Temp 97.6 F (36.4 C)   Ht 5\' 8"  (1.727 m)   Wt 227 lb 6.4 oz (103.1 kg)   BMI 34.58 kg/m    VITAL SIGNS:  reviewed  ASSESSMENT & PLAN:    1. Preoperative risk stratification from cardiovascular standpoint: I discussed my findings and preoperative risk stratification process with the patient at extensive length.  He and his wife had questions which were answered to their satisfaction.  I told him to start walking on a regular basis 15 to 20 minutes every day and he promises to do so.  With this he does not have any symptoms.  If he does this successfully and can walk anywhere between 15 to 20 minutes or up to half an hour at regular pace then I think he is not at high risk for coronary events during the aforementioned surgery.  Also it will be a good assessment of his valvular function and effort tolerance. 2. Essential hypertension: He will keep a track of his blood pressures twice a day and mail it to me in 3 to 4 days from now. 3. Follow-up appointment in 2 weeks or earlier if he has any concerns his shoulder surgery scheduled to be in the second part of June.  COVID-19 Education: The signs and symptoms of COVID-19 were discussed with  the patient and how to seek care for testing (follow up with PCP or arrange E-visit).  The importance of social distancing was discussed today.  Time:   Today, I have spent 15 minutes with the patient with telehealth technology discussing the above problems.     Medication Adjustments/Labs and Tests Ordered: Current medicines are reviewed at length with the patient today.  Concerns regarding medicines are outlined above.   Tests Ordered: No orders of the defined types were placed in this encounter.   Medication Changes: No orders of the defined types were placed in this encounter.   Disposition:  Follow up in 3 month(s)  Signed, Jenean Lindau, MD  10/24/2018 11:42 AM    [place]

## 2018-11-01 ENCOUNTER — Telehealth: Payer: Self-pay | Admitting: *Deleted

## 2018-11-01 NOTE — Telephone Encounter (Signed)
Telephone call to patient . Spoke with wife. Informed her that patient's blood pressure log was reviewed by Dr. Geraldo Pitter and that there is no need to change medication routine at this time. Also informed to continue  to encourage walking daily. Wife states that they have put off his surgery until July 23,2020.

## 2018-11-14 ENCOUNTER — Telehealth: Payer: Medicare Other | Admitting: Cardiology

## 2018-11-14 ENCOUNTER — Other Ambulatory Visit: Payer: Self-pay

## 2018-11-18 ENCOUNTER — Telehealth (INDEPENDENT_AMBULATORY_CARE_PROVIDER_SITE_OTHER): Payer: Medicare Other | Admitting: Cardiology

## 2018-11-18 ENCOUNTER — Encounter: Payer: Self-pay | Admitting: Cardiology

## 2018-11-18 ENCOUNTER — Other Ambulatory Visit: Payer: Self-pay

## 2018-11-18 VITALS — BP 172/92 | HR 68 | Temp 97.9°F | Ht 68.0 in | Wt 225.0 lb

## 2018-11-18 DIAGNOSIS — I2510 Atherosclerotic heart disease of native coronary artery without angina pectoris: Secondary | ICD-10-CM

## 2018-11-18 DIAGNOSIS — F17210 Nicotine dependence, cigarettes, uncomplicated: Secondary | ICD-10-CM | POA: Diagnosis not present

## 2018-11-18 DIAGNOSIS — I10 Essential (primary) hypertension: Secondary | ICD-10-CM

## 2018-11-18 DIAGNOSIS — I350 Nonrheumatic aortic (valve) stenosis: Secondary | ICD-10-CM

## 2018-11-18 NOTE — Patient Instructions (Signed)

## 2018-11-18 NOTE — Progress Notes (Signed)
Virtual Visit via Telephone Note   This visit type was conducted due to national recommendations for restrictions regarding the COVID-19 Pandemic (e.g. social distancing) in an effort to limit this patient's exposure and mitigate transmission in our community.  Due to his co-morbid illnesses, this patient is at least at moderate risk for complications without adequate follow up.  This format is felt to be most appropriate for this patient at this time.  The patient did not have access to video technology/had technical difficulties with video requiring transitioning to audio format only (telephone).  All issues noted in this document were discussed and addressed.  No physical exam could be performed with this format.  Please refer to the patient's chart for his  consent to telehealth for [facility].   Date:  11/18/2018   ID:  Kyle Lewis, DOB 11/15/1945, MRN [id number]  Patient Location: Home Provider Location: Home  PCP:  [name], MD  Cardiologist:  Jenean Lindau, MD  Electrophysiologist:  None   Evaluation Performed:  Follow-Up Visit  Chief Complaint: Moderate aortic stenosis  History of Present Illness:    Kyle Lewis is a 73 y.o. 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  He has moderate aortic stenosis.  He was evaluated with coronary angiography and left and right heart catheterization and echocardiography.  This has revealed moderate aortic stenosis as mentioned above.  He denies any problems at this time and takes care of activities of daily living.  No chest pain orthopnea or PND.  Unfortunately continues to smoke.  He originally wanted to have shoulder surgery but because of the coronavirus situation he has put it off.  He walks as much as he can on a daily basis.  At the time of my evaluation, the patient is alert awake oriented  and in no distress.  The patient does not have symptoms concerning for COVID-19 infection (fever, chills, cough, or new shortness of breath).    Past Medical History:  Diagnosis Date  . 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 Adenomatous colon polyp   . Adrenal mass ([place])    2 benign appearing left adrenal adenomas noted on 01/13/15 CT  . Allergy    seasonal  . Aortic stenosis    mild to moderate by echo and cath in 09/2016  . Arthritis   . BPH (benign prostatic hyperplasia)   . Carotid artery occlusion   . Cataract   . Chronic back pain   . COPD (chronic obstructive pulmonary disease) ([place])   . Coronary artery disease   . CVA (cerebral infarction)   . Diverticulosis   . Hypertension   . Nocturia   . Peripheral vascular disease ([place])   . Sleep apnea    wears CPAP  . Stroke [facility]) 2013   tia    Past Surgical History:  Procedure Laterality Date  . APPENDECTOMY    . BRAIN MENINGIOMA EXCISION     menigioma  . CARPAL TUNNEL RELEASE     left  . cataracts     both eyes  . EYE SURGERY Bilateral    Cataract  . INGUINAL HERNIA REPAIR    . JOINT REPLACEMENT Left 04-01-12   Knee  . LEFT HEART CATH AND CORONARY ANGIOGRAPHY N/A 09/15/2016   Procedure: Left Heart Cath and Coronary Angiography;  Surgeon: Nelva Bush, MD;  Location: [place] CV LAB;  Service: Cardiovascular;  Laterality: N/A;  . MAXIMUM ACCESS (MAS)POSTERIOR LUMBAR INTERBODY FUSION (PLIF) 1 LEVEL N/A 07/07/2015   Procedure:  POSTERIOR LUMBAR INTERBODY FUSION (PLIF) Lumbar Four-Five with Pedicle Screw Fixation Lumbar Two-Five;Laminectomy Lumbar Two-Five;  Surgeon: Eustace Moore, MD;  Location: [place] NEURO ORS;  Service: Neurosurgery;  Laterality: N/A;   POSTERIOR LUMBAR INTERBODY FUSION (PLIF) Lumbar Four-Five with Pedicle Screw Fixation Lumbar Two-Five;Laminectomy Lumbar Two-Five  . PROSTATE SURGERY    . RIGHT/LEFT HEART CATH AND CORONARY ANGIOGRAPHY N/A 01/24/2018    Procedure: RIGHT/LEFT HEART CATH AND CORONARY ANGIOGRAPHY;  Surgeon: Nelva Bush, MD;  Location: [place] CV LAB;  Service: Cardiovascular;  Laterality: N/A;  . TONSILLECTOMY    . TOTAL KNEE ARTHROPLASTY  04/01/2012   Procedure: TOTAL KNEE ARTHROPLASTY;  Surgeon: Gearlean Alf, MD;  Location: [facility] OR[facility];  Service: Orthopedics;  Laterality: Left;  . TRANSURETHRAL RESECTION OF PROSTATE    . UMBILICAL HERNIA REPAIR  2011     Current Meds  Medication Sig  . ALPRAZolam (XANAX) 0.5 MG tablet Take 0.5 mg by mouth at bedtime as needed for anxiety.  . clopidogrel (PLAVIX) 300 MG TABS tablet Take 300 mg by mouth daily.   . finasteride (PROSCAR) 5 MG tablet Take 5 mg by mouth daily.  Marland Kitchen HYDROcodone-acetaminophen (NORCO/VICODIN) 5-325 MG tablet Take 1 tablet by mouth every 6 (six) hours as needed for moderate pain.  Marland Kitchen loperamide (IMODIUM A-D) 2 MG capsule Take 2 mg by mouth as needed.   Marland Kitchen olmesartan (BENICAR) 40 MG tablet Take 40 mg by mouth daily.  . Tamsulosin HCl (FLOMAX) 0.4 MG CAPS Take 0.4 mg by mouth daily after supper.   . [DISCONTINUED] tiZANidine (ZANAFLEX) 4 MG capsule Take 4 mg by mouth 3 (three) times daily as needed for muscle spasms.     Allergies:   Cefazolin   Social History   Tobacco Use  . Smoking status: Current Every Day Smoker    Packs/day: 0.75    Years: 40.00    Pack years: 30.00    Types: Cigarettes  . Smokeless tobacco: Never Used  . Tobacco comment: 1/2 pack per day   Substance Use Topics  . Alcohol use: Yes    Comment: rare  . Drug use: No     Family Hx: The patient's family history includes Diabetes in his father; Heart attack in his father; Heart disease in his father and mother; Hyperlipidemia in his mother; Hypertension in his mother; Prostate cancer in his maternal grandfather. There is no history of Colon cancer, Esophageal cancer, Rectal cancer, or Stomach cancer.  ROS:   Please see the history of present illness.    As mentioned above All other  systems reviewed and are negative.   Prior CV studies:   The following studies were reviewed today:  I reviewed coronary angiography and echocardiogram report with the patient at extensive length  Labs/Other Tests and Data Reviewed:    EKG:  No ECG reviewed.  Recent Labs: 01/17/2018: Hemoglobin 13.1; Platelets 357 02/01/2018: ALT 12; BUN 13; Creatinine, Ser 0.70; Potassium 4.3; Sodium 139   Recent Lipid Panel Lab Results  Component Value Date/Time   CHOL 144 02/01/2018 11:01 AM   TRIG 92 02/01/2018 11:01 AM   HDL 48 02/01/2018 11:01 AM   CHOLHDL 3.0 02/01/2018 11:01 AM   CHOLHDL 4.0 09/15/2016 08:24 AM   LDLCALC 78 02/01/2018 11:01 AM    Wt Readings from Last 3 Encounters:  11/18/18 225 lb (102.1  kg)  10/24/18 227 lb 6.4 oz (103.1 kg)  09/19/18 228 lb (103.4 kg)     Objective:    Vital Signs:  BP (!) 172/92 (BP Location: Left Arm, Patient Position: Sitting, Cuff Size: Normal)   Pulse 68   Temp 97.9 F (36.6 C)   Ht 5\' 8"  (1.727 m)   Wt 225 lb (102.1 kg)   BMI 34.21 kg/m    VITAL SIGNS:  reviewed  ASSESSMENT & PLAN:    1. Moderate aortic stenosis: Asymptomatic at this time.  Patient denies any history of chest pain orthopnea PND syncope or any dizziness or shortness of breath.  We will continue with medical therapy. 2. Essential hypertension: Blood pressure stable.  Today was a little anxious and also mentions to me that he has not taken his morning medications. 3. Cigarette smoking: I spent 5 minutes with the patient discussing solely about smoking. Smoking cessation was counseled. I suggested to the patient also different medications and pharmacological interventions. Patient is keen to try stopping on its own at this time. He will get back to me if he needs any further assistance in this matter. 4. Mixed dyslipidemia: Diet was discussed.  Patient will continue statin therapy.  He also takes antiplatelet agents on a regular basis. 5. Patient will be seen in  follow-up appointment in 3 months or earlier if the patient has any concerns   COVID-19 Education: The signs and symptoms of COVID-19 were discussed with the patient and how to seek care for testing (follow up with PCP or arrange E-visit).  The importance of social distancing was discussed today.  Time:   Today, I have spent 15 minutes with the patient with telehealth technology discussing the above problems.     Medication Adjustments/Labs and Tests Ordered: Current medicines are reviewed at length with the patient today.  Concerns regarding medicines are outlined above.   Tests Ordered: No orders of the defined types were placed in this encounter.   Medication Changes: No orders of the defined types were placed in this encounter.   Follow Up:  Virtual Visit or In Person in 3 month(s)  Signed, Jenean Lindau, MD  11/18/2018 11:40 AM    [place]

## 2018-12-09 ENCOUNTER — Encounter: Payer: Self-pay | Admitting: Podiatry

## 2018-12-09 ENCOUNTER — Other Ambulatory Visit: Payer: Self-pay

## 2018-12-09 ENCOUNTER — Ambulatory Visit (INDEPENDENT_AMBULATORY_CARE_PROVIDER_SITE_OTHER): Payer: Medicare Other | Admitting: Podiatry

## 2018-12-09 VITALS — Temp 99.2°F | Resp 16

## 2018-12-09 DIAGNOSIS — D689 Coagulation defect, unspecified: Secondary | ICD-10-CM | POA: Diagnosis not present

## 2018-12-09 DIAGNOSIS — B351 Tinea unguium: Secondary | ICD-10-CM

## 2018-12-09 NOTE — Progress Notes (Signed)
Subjective:  Patient ID: Kyle Lewis, male    DOB: 05-30-1946,  MRN: [id number]  Chief Complaint  Patient presents with  . debride    BL routine nail care -pt still on plavix   73 y.o. male presents with the above complaint.  Hx as above. On plavix.  Review of Systems: Negative except as noted in the HPI. Denies N/V/F/Ch.  Past Medical History:  Diagnosis Date  . 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 Adenomatous colon polyp   . Adrenal mass ([place])    2 benign appearing left adrenal adenomas noted on 01/13/15 CT  . Allergy    seasonal  . Aortic stenosis    mild to moderate by echo and cath in 09/2016  . Arthritis   . BPH (benign prostatic hyperplasia)   . Carotid artery occlusion   . Cataract   . Chronic back pain   . COPD (chronic obstructive pulmonary disease) ([place])   . Coronary artery disease   . CVA (cerebral infarction)   . Diverticulosis   . Hypertension   . Nocturia   . Peripheral vascular disease ([place])   . Sleep apnea    wears CPAP  . Stroke [facility]) 2013   tia     Current Outpatient Medications:  .  ALPRAZolam (XANAX) 0.5 MG tablet, Take 0.5 mg by mouth at bedtime as needed for anxiety., Disp: , Rfl:  .  amLODipine (NORVASC) 5 MG tablet, , Disp: , Rfl:  .  clarithromycin (BIAXIN) 500 MG tablet, , Disp: , Rfl:  .  clopidogrel (PLAVIX) 300 MG TABS tablet, Take 300 mg by mouth daily. , Disp: , Rfl:  .  finasteride (PROSCAR) 5 MG tablet, Take 5 mg by mouth daily., Disp: , Rfl:  .  HYDROcodone-acetaminophen (NORCO/VICODIN) 5-325 MG tablet, Take 1 tablet by mouth every 6 (six) hours as needed for moderate pain., Disp: , Rfl:  .  loperamide (IMODIUM A-D) 2 MG capsule, Take 2 mg by mouth as needed. , Disp: , Rfl:  .  nitrofurantoin (MACRODANTIN) 100 MG capsule, Take 100 mg by mouth at bedtime., Disp: , Rfl:  .  olmesartan (BENICAR) 40 MG tablet, Take 40 mg by mouth daily., Disp: , Rfl:  .  predniSONE (DELTASONE) 10 MG  tablet, , Disp: , Rfl:  .  Tamsulosin HCl (FLOMAX) 0.4 MG CAPS, Take 0.4 mg by mouth daily after supper. , Disp: , Rfl:   Social History   Tobacco Use  Smoking Status Current Every Day Smoker  . Packs/day: 0.75  . Years: 40.00  . Pack years: 30.00  . Types: Cigarettes  Smokeless Tobacco Never Used  Tobacco Comment   1/2 pack per day     Allergies  Allergen Reactions  . Cefazolin Rash    The patient had surgery and was given cefazolin intraop. ~ 10 days later he developed a rash confirmed by biopsy to be consistent w/ drug eruption. We cannot know for sure, but this is the most likely agent.     Objective:   Vitals:   12/09/18 1322  Resp: 16  Temp: 99.2 F (37.3 C)   There is no height or weight on file to calculate BMI. Constitutional Well developed. Well nourished.  Vascular Dorsalis pedis pulses palpable bilaterally. Posterior tibial pulses palpable bilaterally. Capillary refill normal to all digits.  No cyanosis or clubbing noted. Pedal hair growth normal.  Neurologic Normal speech. Oriented to person, place, and  time. Epicritic sensation to light touch grossly present bilaterally.  Dermatologic Nails elongated dystrophic pain to palpation No open wounds. No skin lesions.  Orthopedic: Normal joint ROM without pain or crepitus bilaterally. No visible deformities. No bony tenderness.   Radiographs: None Assessment:   1. 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  Onychomycosis with coagulation defect. -Routine foot care as below  Procedure: Nail Debridement Rationale: Patient meets criteria for routine foot care due to Coag defect Type of Debridement: manual, sharp debridement. Instrumentation: Nail nipper, rotary burr. Number of Nails: 10  No follow-ups on file.

## 2018-12-26 ENCOUNTER — Inpatient Hospital Stay: Admit: 2018-12-26 | Payer: Medicare Other | Admitting: Orthopedic Surgery

## 2018-12-26 SURGERY — ARTHROPLASTY, SHOULDER, TOTAL, REVERSE
Anesthesia: General | Laterality: Right

## 2019-02-19 ENCOUNTER — Telehealth: Payer: Medicare Other | Admitting: Cardiology

## 2019-02-21 ENCOUNTER — Encounter (HOSPITAL_COMMUNITY): Payer: Medicare Other

## 2019-02-24 ENCOUNTER — Encounter: Payer: Self-pay | Admitting: Primary Care

## 2019-02-24 ENCOUNTER — Other Ambulatory Visit: Payer: Self-pay

## 2019-02-24 ENCOUNTER — Ambulatory Visit (INDEPENDENT_AMBULATORY_CARE_PROVIDER_SITE_OTHER): Payer: Medicare Other | Admitting: Primary Care

## 2019-02-24 VITALS — BP 138/74 | HR 61 | Ht 67.0 in | Wt 222.8 lb

## 2019-02-24 DIAGNOSIS — R0609 Other forms of dyspnea: Secondary | ICD-10-CM | POA: Diagnosis not present

## 2019-02-24 DIAGNOSIS — I2510 Atherosclerotic heart disease of native coronary artery without angina pectoris: Secondary | ICD-10-CM

## 2019-02-24 DIAGNOSIS — G4733 Obstructive sleep apnea (adult) (pediatric): Secondary | ICD-10-CM

## 2019-02-24 DIAGNOSIS — F17210 Nicotine dependence, cigarettes, uncomplicated: Secondary | ICD-10-CM

## 2019-02-24 MED ORDER — ALBUTEROL SULFATE HFA 108 (90 BASE) MCG/ACT IN AERS: 2.0000 | INHALATION_SPRAY | Freq: Four times a day (QID) | RESPIRATORY_TRACT | 2 refills | Status: DC | PRN

## 2019-02-24 NOTE — Patient Instructions (Signed)
Pleasure meeting you today, great work wearing BIPAP every night  Recommendations: Continue BIPAP use every night, goal 4-6 hours or more Do not drive if experiencing excessive daytime fatigue or somnolence Continue to work on quitting smoking (taper amount and pick quit date!!!!)  Rx: - Abuterol rescue inhaler- take 2 puffs every 4-6 hours as needed for shortness of breath/wheezing *Keep take of how often you are having to use your inhaler, if requiring daily then we may consider putting you on maintenance inhaler   Letter: -Please provider patient with letter states that he is under Dr. Annamaria Boots care for OSA. He is 100% compliant with BIPAP use and is not having any significant event. You can attach download if able.   Orders: -Needs PFTs  Follow-up: - 6-8 weeks with PFTs - Dr. Melvyn Novas or NP     Living With Sleep Apnea Sleep apnea is a condition in which breathing pauses or becomes shallow during sleep. Sleep apnea is most commonly caused by a collapsed or blocked airway. People with sleep apnea snore loudly and have times when they gasp and stop breathing for 10 seconds or more during sleep. This happens over and over during the night. This disrupts your sleep and keeps your body from getting the rest that it needs, which can cause tiredness and lack of energy (fatigue) during the day. The breaks in breathing also interrupt the deep sleep that you need to feel rested. Even if you do not completely wake up from the gaps in breathing, your sleep may not be restful. You may also have a headache in the morning and low energy during the day, and you may feel anxious or depressed. How can sleep apnea affect me? Sleep apnea increases your chances of extreme tiredness during the day (daytime fatigue). It can also increase your risk for health conditions, such as:  Heart attack.  Stroke.  Diabetes.  Heart failure.  Irregular heartbeat.  High blood pressure. If you have daytime fatigue as a  result of sleep apnea, you may be more likely to:  Perform poorly at school or work.  Fall asleep while driving.  Have difficulty with attention.  Develop depression or anxiety.  Become severely overweight (obese).  Have sexual dysfunction. What actions can I take to manage sleep apnea? Sleep apnea treatment   If you were given a device to open your airway while you sleep, use it only as told by your health care provider. You may be given: ? An oral appliance. This is a custom-made mouthpiece that shifts your lower jaw forward. ? A continuous positive airway pressure (CPAP) device. This device blows air through a mask when you breathe out (exhale). ? A nasal expiratory positive airway pressure (EPAP) device. This device has valves that you put into each nostril. ? A bi-level positive airway pressure (BPAP) device. This device blows air through a mask when you breathe in (inhale) and breathe out (exhale).  You may need surgery if other treatments do not work for you. Sleep habits  Go to sleep and wake up at the same time every day. This helps set your internal clock (circadian rhythm) for sleeping. ? If you stay up later than usual, such as on weekends, try to get up in the morning within 2 hours of your normal wake time.  Try to get at least 7-9 hours of sleep each night.  Stop computer, tablet, and mobile phone use a few hours before bedtime.  Do not take long naps during  the day. If you nap, limit it to 30 minutes.  Have a relaxing bedtime routine. Reading or listening to music may relax you and help you sleep.  Use your bedroom only for sleep. ? Keep your television and computer out of your bedroom. ? Keep your bedroom cool, dark, and quiet. ? Use a supportive mattress and pillows.  Follow your health care provider's instructions for other changes to sleep habits. Nutrition  Do not eat heavy meals in the evening.  Do not have caffeine in the later part of the day.  The effects of caffeine can last for more than 5 hours.  Follow your health care provider's or dietitian's instructions for any diet changes. Lifestyle      Do not drink alcohol before bedtime. Alcohol can cause you to fall asleep at first, but then it can cause you to wake up in the middle of the night and have trouble getting back to sleep.  Do not use any products that contain nicotine or tobacco, such as cigarettes and e-cigarettes. If you need help quitting, ask your health care provider. Medicines  Take over-the-counter and prescription medicines only as told by your health care provider.  Do not use over-the-counter sleep medicine. You can become dependent on this medicine, and it can make sleep apnea worse.  Do not use medicines, such as sedatives and narcotics, unless told by your health care provider. Activity  Exercise on most days, but avoid exercising in the evening. Exercising near bedtime can interfere with sleeping.  If possible, spend time outside every day. Natural light helps regulate your circadian rhythm. General information  Lose weight if you need to, and maintain a healthy weight.  Keep all follow-up visits as told by your health care provider. This is important.  If you are having surgery, make sure to tell your health care provider that you have sleep apnea. You may need to bring your device with you. Where to find more information Learn more about sleep apnea and daytime fatigue from:  American Sleep Association: sleepassociation.[place]: sleepfoundation.org  National Heart, Lung, and Blood Institute: https://www.[place].biz/ Summary  Sleep apnea can cause daytime fatigue and other serious health conditions.  Both sleep apnea and daytime fatigue can be bad for your health and well-being.  You may need to wear a device while sleeping to help keep your airway open.  If you are having surgery, make sure to tell your health care  provider that you have sleep apnea. You may need to bring your device with you.  Making changes to sleep habits, diet, lifestyle, and activity can help you manage sleep apnea. This information is not intended to replace advice given to you by your health care provider. Make sure you discuss any questions you have with your health care provider. Document Released: 08/16/2017 Document Revised: 09/13/2018 Document Reviewed: 08/16/2017 Elsevier Patient Education  [place].

## 2019-02-24 NOTE — Progress Notes (Signed)
@Patient  ID: Kyle Lewis, male    DOB: 04-Jul-1945, 74 y.o.   MRN: [id number]  Chief Complaint  Patient presents with  . Follow-up    [facility] pt on bipap for OSA. pt states he is doing well with bipap, no complaints at this time.     Referring provider: [name], MD  HPI: 73 year old male, current every day smoker (30+ pack year hx). PMH significant for OSA, moderate COPD, insomnia, aortic stenosis, PVD, stroke, CAD, hypertension, AAA repair, osteoarthritis, lumbar spinal fusion, BPH. Patient of Dr. Loretha Brasil, last seen on 09/19/18. NPSG 10/06/17 showed AHI 84.6/ hr. BIPAP Max IPAP 25, Min EPAP 5, Min PS 4.   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 [place] 3. Referred to [facility] for supplies. Encouraged smoking cessation.  02/24/2019 Patient presents today for 6 month OSA follow-up. He is doing well, reports compliance with BIPAP everynight. He is sleeping well. No issues with pressure setting or mask. Breathing is at baseline. Occasional shortness of breath when walking long distanced. Reports some cough, brings up clear mucus. Seldom wheezing. He is not on a maintenance inhaler and does not have rescue inhaler. Works part time delivering school buses. States that he could not get physical renewed for more than 3 months at a time. DME is [employer].    [facility]view download -100% compliant with use - Averaging 8 hours - AHI 2.3.   Allergies  Allergen Reactions  . Cefazolin Rash    The patient had surgery and was given cefazolin intraop. ~ 10 days later he developed a rash confirmed by biopsy to be consistent w/ drug eruption. We cannot know for sure, but this is the most likely agent.      Immunization History  Administered Date(s) Administered  . Influenza, High Dose Seasonal PF 04/03/2018, 02/17/2019  . Influenza,inj,Quad PF,6+ Mos 07/08/2015    Past Medical History:  Diagnosis Date  . 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  Adenomatous colon polyp   . Adrenal mass ([place])    2 benign appearing left adrenal adenomas noted on 01/13/15 CT  . Allergy    seasonal  . Aortic stenosis    mild to moderate by echo and cath in 09/2016  . Arthritis   . BPH (benign prostatic hyperplasia)   . Carotid artery occlusion   . Cataract   . Chronic back pain   . COPD (chronic obstructive pulmonary disease) ([place])   . Coronary artery disease   . CVA (cerebral infarction)   . Diverticulosis   . Hypertension   . Nocturia   . Peripheral vascular disease ([place])   . Sleep apnea    wears CPAP  . Stroke [facility]) 2013   tia     Tobacco History: Social History   Tobacco Use  Smoking Status Current Every Day Smoker  . Packs/day: 0.75  . Years: 40.00  . Pack years: 30.00  . Types: Cigarettes  Smokeless Tobacco Never Used  Tobacco Comment   1/2 pack per day    Ready to quit: Not Answered Counseling given: Not Answered Comment: 1/2 pack per day    Outpatient Medications Prior to Visit  Medication Sig Dispense Refill  . ALPRAZolam (XANAX) 0.5 MG tablet Take 0.5 mg by mouth at bedtime as needed for anxiety.    Marland Kitchen amLODipine (NORVASC) 5 MG tablet     . clarithromycin (BIAXIN) 500 MG tablet     . clopidogrel (  PLAVIX) 300 MG TABS tablet Take 300 mg by mouth daily.     . finasteride (PROSCAR) 5 MG tablet Take 5 mg by mouth daily.    Marland Kitchen loperamide (IMODIUM A-D) 2 MG capsule Take 2 mg by mouth as needed.     Marland Kitchen olmesartan (BENICAR) 40 MG tablet Take 40 mg by mouth daily.    . Tamsulosin HCl (FLOMAX) 0.4 MG CAPS Take 0.4 mg by mouth daily after supper.     . predniSONE (DELTASONE) 10 MG tablet     . HYDROcodone-acetaminophen (NORCO/VICODIN) 5-325 MG tablet Take 1 tablet by mouth every 6 (six) hours as needed for moderate pain.    . nitrofurantoin (MACRODANTIN) 100 MG capsule Take 100 mg by mouth at bedtime.     No facility-administered medications prior to visit.    Review of Systems  Review of Systems  Constitutional: Negative.    Respiratory: Negative for cough, shortness of breath and wheezing.        DOE   Cardiovascular: Negative.    Physical Exam  BP 138/74 (BP Location: Left Arm, Cuff Size: Normal)   Pulse 61   Ht 5\' 7"  (1.702 m)   Wt 222 lb 12.8 oz (101.1 kg)   SpO2 97%   BMI 34.90 kg/m  Physical Exam Constitutional:      Appearance: Normal appearance. He is obese. He is not ill-appearing.  HENT:     Head: Normocephalic and atraumatic.     Nose: Nose normal.     Mouth/Throat:     Mouth: Mucous membranes are moist.     Pharynx: Oropharynx is clear.     Comments: Mallampati class II-III Neck:     Musculoskeletal: Normal range of motion and neck supple.  Cardiovascular:     Rate and Rhythm: Normal rate and regular rhythm.  Pulmonary:     Effort: Pulmonary effort is normal. No respiratory distress.     Breath sounds: Wheezing present. No rhonchi.     Comments: Dull expiratory wheeze at bases Musculoskeletal: Normal range of motion.  Skin:    General: Skin is warm and dry.  Neurological:     General: No focal deficit present.     Mental Status: He is alert and oriented to person, place, and time. Mental status is at baseline.  Psychiatric:        Mood and Affect: Mood normal.        Behavior: Behavior normal.        Thought Content: Thought content normal.        Judgment: Judgment normal.      Lab Results:  CBC    Component Value Date/Time   WBC 7.5 01/17/2018 1520   WBC 9.2 01/12/2018 0222   RBC 4.28 01/17/2018 1520   RBC 4.20 (L) 01/12/2018 0222   HGB 13.1 01/17/2018 1520   HCT 38.8 01/17/2018 1520   PLT 357 01/17/2018 1520   MCV 91 01/17/2018 1520   MCH 30.6 01/17/2018 1520   MCH 30.2 01/12/2018 0222   MCHC 33.8 01/17/2018 1520   MCHC 31.9 01/12/2018 0222   RDW 15.8 (H) 01/17/2018 1520   LYMPHSABS 2.6 09/11/2016 1647   MONOABS 0.6 06/29/2015 1200   EOSABS 0.3 09/11/2016 1647   BASOSABS 0.0 09/11/2016 1647    BMET    Component Value Date/Time   NA 139 02/01/2018  1101   K 4.3 02/01/2018 1101   CL 100 02/01/2018 1101   CO2 28 02/01/2018 1101   GLUCOSE 116 (H) 02/01/2018  1101   GLUCOSE 147 (H) 01/12/2018 0222   BUN 13 02/01/2018 1101   CREATININE 0.70 (L) 02/01/2018 1101   CALCIUM 9.3 02/01/2018 1101   GFRNONAA 95 02/01/2018 1101   GFRAA 110 02/01/2018 1101    BNP No results found for: BNP  ProBNP No results found for: PROBNP  Imaging: No results found.   Assessment & Plan:   OSA (obstructive sleep apnea) - Patient is 100% compliant and reports benefit from BIPAP use - Pressure IPAP 6/ EPAP 10; AHI 2.3  - Continue BIPAP use every night, goal 4-6 hours or more - Do not drive if experiencing excessive daytime fatigue or somnolence  Exertional dyspnea - Current smoker - Needs PFTs - RX albuterol 2 puffs every 4-6 hours prn sob/wheezing  Cigarette smoker - Smoking cessation reviewed, encouraged tapering amount and Lewis quit date     Martyn Ehrich, NP 02/24/2019

## 2019-02-24 NOTE — Assessment & Plan Note (Signed)
-   Current smoker - Needs PFTs - RX albuterol 2 puffs every 4-6 hours prn sob/wheezing

## 2019-02-24 NOTE — Assessment & Plan Note (Signed)
-   Smoking cessation reviewed, encouraged tapering amount and picking quit date

## 2019-02-24 NOTE — Assessment & Plan Note (Addendum)
-   Patient is 100% compliant and reports benefit from BIPAP use - Pressure IPAP 6/ EPAP 10; AHI 2.3  - Continue BIPAP use every night, goal 4-6 hours or more - Do not drive if experiencing excessive daytime fatigue or somnolence

## 2019-02-27 ENCOUNTER — Inpatient Hospital Stay: Admit: 2019-02-27 | Payer: Medicare Other | Admitting: Orthopedic Surgery

## 2019-02-27 SURGERY — ARTHROPLASTY, SHOULDER, TOTAL, REVERSE
Anesthesia: General | Laterality: Right

## 2019-03-10 ENCOUNTER — Ambulatory Visit (INDEPENDENT_AMBULATORY_CARE_PROVIDER_SITE_OTHER): Payer: Medicare Other | Admitting: Podiatry

## 2019-03-10 ENCOUNTER — Other Ambulatory Visit: Payer: Self-pay

## 2019-03-10 DIAGNOSIS — Z5329 Procedure and treatment not carried out because of patient's decision for other reasons: Secondary | ICD-10-CM

## 2019-03-10 NOTE — Progress Notes (Signed)
No show for appt. 

## 2019-03-24 ENCOUNTER — Ambulatory Visit: Payer: Medicare Other | Admitting: Internal Medicine

## 2019-03-25 DIAGNOSIS — M542 Cervicalgia: Secondary | ICD-10-CM | POA: Insufficient documentation

## 2019-03-25 HISTORY — DX: Cervicalgia: M54.2

## 2019-03-31 ENCOUNTER — Telehealth: Payer: Self-pay | Admitting: Primary Care

## 2019-03-31 NOTE — Telephone Encounter (Signed)
LMTCB x1 for pt's wife.  

## 2019-04-01 NOTE — Telephone Encounter (Signed)
Spoke with patient's wife Kyle Lewis. She stated that the patient had an appt with Beth on 9/21 and he received a letter stating that he was cleared to drive. He has misplaced the letter in process of moving. She has requested that we send a copy of the letter to [phone]. I advised her that I would send the letter today. Will also send a copy of the letter to the patient. Their address has changed to [address] [place] [address].   Nothing further needed at time of call.

## 2019-04-01 NOTE — Telephone Encounter (Signed)
Pt returning call regarding letter.   [phone].

## 2019-04-04 ENCOUNTER — Other Ambulatory Visit (HOSPITAL_COMMUNITY)
Admission: RE | Admit: 2019-04-04 | Discharge: 2019-04-04 | Disposition: A | Discharge status: Home / Self Care | Payer: Medicare Other | Source: Ambulatory Visit | Attending: Internal Medicine | Admitting: Internal Medicine

## 2019-04-04 ENCOUNTER — Other Ambulatory Visit: Payer: Self-pay | Admitting: *Deleted

## 2019-04-04 DIAGNOSIS — Z20828 Contact with and (suspected) exposure to other viral communicable diseases: Secondary | ICD-10-CM | POA: Insufficient documentation

## 2019-04-04 DIAGNOSIS — Z01812 Encounter for preprocedural laboratory examination: Secondary | ICD-10-CM | POA: Insufficient documentation

## 2019-04-04 DIAGNOSIS — R0609 Other forms of dyspnea: Secondary | ICD-10-CM

## 2019-04-04 LAB — SARS CORONAVIRUS 2 (TAT 6-24 HRS): SARS Coronavirus 2: NEGATIVE

## 2019-04-07 ENCOUNTER — Ambulatory Visit (INDEPENDENT_AMBULATORY_CARE_PROVIDER_SITE_OTHER): Payer: Medicare Other | Admitting: Internal Medicine

## 2019-04-07 ENCOUNTER — Encounter: Payer: Self-pay | Admitting: Internal Medicine

## 2019-04-07 ENCOUNTER — Ambulatory Visit (INDEPENDENT_AMBULATORY_CARE_PROVIDER_SITE_OTHER): Payer: Medicare Other

## 2019-04-07 ENCOUNTER — Other Ambulatory Visit: Payer: Self-pay

## 2019-04-07 DIAGNOSIS — R0600 Dyspnea, unspecified: Secondary | ICD-10-CM

## 2019-04-07 DIAGNOSIS — I2510 Atherosclerotic heart disease of native coronary artery without angina pectoris: Secondary | ICD-10-CM | POA: Diagnosis not present

## 2019-04-07 DIAGNOSIS — R0609 Other forms of dyspnea: Secondary | ICD-10-CM

## 2019-04-07 DIAGNOSIS — I350 Nonrheumatic aortic (valve) stenosis: Secondary | ICD-10-CM | POA: Diagnosis not present

## 2019-04-07 DIAGNOSIS — J449 Chronic obstructive pulmonary disease, unspecified: Secondary | ICD-10-CM

## 2019-04-07 HISTORY — DX: Chronic obstructive pulmonary disease, unspecified: J44.9

## 2019-04-07 LAB — PULMONARY FUNCTION TEST
DL/VA % pred: 74 %
DL/VA: 3.05 ml/min/mmHg/L
DLCO unc % pred: 66 %
DLCO unc: 15.22 ml/min/mmHg
FEF 25-75 Post: 0.65 L/sec
FEF 25-75 Pre: 0.67 L/sec
FEF2575-%Change-Post: -2 %
FEF2575-%Pred-Post: 31 %
FEF2575-%Pred-Pre: 32 %
FEV1-%Change-Post: -2 %
FEV1-%Pred-Post: 56 %
FEV1-%Pred-Pre: 57 %
FEV1-Post: 1.54 L
FEV1-Pre: 1.58 L
FEV1FVC-%Change-Post: 1 %
FEV1FVC-%Pred-Pre: 73 %
FEV6-%Change-Post: -1 %
FEV6-%Pred-Post: 77 %
FEV6-%Pred-Pre: 78 %
FEV6-Post: 2.73 L
FEV6-Pre: 2.77 L
FEV6FVC-%Change-Post: 3 %
FEV6FVC-%Pred-Post: 104 %
FEV6FVC-%Pred-Pre: 101 %
FVC-%Change-Post: -3 %
FVC-%Pred-Post: 74 %
FVC-%Pred-Pre: 77 %
FVC-Post: 2.81 L
FVC-Pre: 2.92 L
Post FEV1/FVC ratio: 55 %
Post FEV6/FVC ratio: 98 %
Pre FEV1/FVC ratio: 54 %
Pre FEV6/FVC Ratio: 95 %
RV % pred: 157 %
RV: 3.61 L
TLC % pred: 106 %
TLC: 6.74 L

## 2019-04-07 NOTE — Assessment & Plan Note (Signed)
Quit smoking 03/2019 - PFT's  04/07/2019  FEV1 1.58 (57 % ) ratio 0.54  p 0 % improvement from saba p nothing prior to study with DLCO  15.22 (66%) corrects to 3.05 (74%)  for alv volume and FV curve concave classically    As I explained to this patient in detail:  although there is moderate copd present, it may not be clinically relevant:   it does not appear to be limiting activity tolerance any more than a set of worn tires limits someone from driving a car  around a parking lot.  A new set of Michelins might look good but would have no perceived impact on the performance of the car and would not be worth the cost.  That is to say:   this pt is so sedentary due to obesity, djd knees/ deconditioning that    I don't recommend aggressive pulmonary rx at this point unless limiting symptoms arise or acute exacerbations become as issue, neither of which is the case now.  I asked the patient to contact this office at any time in the future should either of these problems arise.    Also rec LDSCT for lung ca but will call for referral for shared decision making with our NP if choses to pursue this modality > advised that stopping smoking prevents more lung ca than LDSCT and re-inforced importance of maintaining completely off at this point   > 50 % of this final summary f/u 25 min ov spent in face to face counseling.  >>> pulmonary f/u is prn

## 2019-04-07 NOTE — Patient Instructions (Addendum)
Only use your albuterol as a rescue medication to be used if you can't catch your breath by resting or doing a relaxed purse lip breathing pattern.  - The less you use it, the better it will work when you need it. - Ok to use up to 2 puffs  every 4 hours if you must but call for immediate appointment if use goes up over your usual need - Don't leave home without it !!  (think of it like the spare tire for your car)   Try using albuterol x 2 pffs x 15 min before exertion to see if makes a difference and call me back if this is the case for stronger longer acting version  Call if you wish to entire the lung cancer screening program give [place] a call  Please remember to go to the  x-ray department  for your tests - we will call you with the results when they are available    Pulmonary follow up is as needed

## 2019-04-07 NOTE — Progress Notes (Signed)
Full PFT performed today. °

## 2019-04-07 NOTE — Assessment & Plan Note (Signed)
ECHO 01/30/18 - Left ventricle: The cavity size was normal. There was moderate   focal basal hypertrophy of the septum. Systolic function was   vigorous. The estimated ejection fraction was in the range of 65%   to 70%. Wall motion was normal; there were no regional wall   motion abnormalities. Doppler parameters are consistent with   abnormal left ventricular relaxation (grade 1 diastolic   dysfunction). - Aortic valve: Trileaflet; severely thickened, severely calcified   leaflets. Valve mobility was restricted. There was moderate   stenosis. Peak velocity (S): 366 cm/s. Mean gradient (S): 32 mm   Hg. Peak gradient (S): 54 mm Hg. Valve area (VTI): 1.23 cm^2.   Valve area (Vmax): 1.4 cm^2. Valve area (Vmean): 1.37 cm^2. - Mitral valve: There was trivial regurgitation. - Pulmonary arteries: Systolic pressure was mildly increased. PA   peak pressure: 33 mm Hg (S).  At risk of progression to severe AS/ advised f/u as planned

## 2019-04-07 NOTE — Progress Notes (Addendum)
Kyle Lewis, male    DOB: 06-28-1945,    MRN: [id number]   Brief patient profile:  9 yowm active smoker with onset insomnia "can't get to sleep" on side x 2015 > chodri eval place on cpap > slept better but then changed to bipap and just wanted to change sleep doctors due to communication issues with Chodri, actually satisfied he's sleeping better with bipap and denies every having hypersomnolence or poorly controlled hypertension previous to dx.      History of Present Illness  09/05/2018  Pulmonary/ 1st office eval/Nicosha Struve  Chief Complaint  Patient presents with  . Consult    Consult for COPD. He has previously seen pulmonology but was not happy with their services. He to get established for sleep and COPD.   Dyspnea:  More limited by knee and back than breathing but still able to get MB flat and haul the trash to street Cough: not a problem  Sleep: fine as far  as he's aware / minimal daytime drowsiness / some p supper  SABA use: none  rec Sleep w/u   04/07/2019  f/u ov/Ilani Otterson re: COPD II/ reported quit smoking a week prior to [place]   Chief Complaint  Patient presents with  . Follow-up    PFT's done today.   Dyspnea:  Limited by knees  >  Breathing  Cough: no  Sleeping: on bipap per [place]  SABA use: twice daily no benefit "does it that way cause was told to"  02: none    No obvious day to day or daytime variability or assoc excess/ purulent sputum or mucus plugs or hemoptysis or cp or chest tightness, subjective wheeze or overt sinus or hb symptoms.   Sleeping as above  without nocturnal  or early am exacerbation  of respiratory  c/o's or need for noct saba. Also denies any obvious fluctuation of symptoms with weather or environmental changes or other aggravating or alleviating factors except as outlined above   No unusual exposure hx or h/o childhood pna/ asthma or knowledge of premature birth.  Current Allergies, Complete Past Medical History, Past Surgical History, Family  History, and Social History were reviewed in [employer] record.  ROS  The following are not active complaints unless bolded Hoarseness, sore throat, dysphagia, dental problems, itching, sneezing,  nasal congestion or discharge of excess mucus or purulent secretions, ear ache,   fever, chills, sweats, unintended wt loss or wt gain, classically pleuritic or exertional cp,  orthopnea pnd or arm/hand swelling  or leg swelling, presyncope, palpitations, abdominal pain, anorexia, nausea, vomiting, diarrhea  or change in bowel habits or change in bladder habits, change in stools or change in urine, dysuria, hematuria,  rash, arthralgias, visual complaints, headache, numbness, weakness or ataxia or problems with walking or coordination,  change in mood or  memory.        Current Meds  Medication Sig  . albuterol (VENTOLIN HFA) 108 (90 Base) MCG/ACT inhaler Inhale 2 puffs into the lungs every 6 (six) hours as needed for wheezing or shortness of breath.  . ALPRAZolam (XANAX) 0.5 MG tablet Take 0.5 mg by mouth at bedtime as needed for anxiety.  . clopidogrel (PLAVIX) 300 MG TABS tablet Take 300 mg by mouth daily.   . finasteride (PROSCAR) 5 MG tablet Take 5 mg by mouth daily.  Marland Kitchen loperamide (IMODIUM A-D) 2 MG capsule Take 2 mg by mouth as needed.   . rosuvastatin (CRESTOR) 40 MG tablet rosuvastatin 40 mg  tablet  . Tamsulosin HCl (FLOMAX) 0.4 MG CAPS Take 0.4 mg by mouth daily after supper.                   Past Medical History:  Diagnosis Date  . 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 Adenomatous colon polyp   . Adrenal mass ([place])    2 benign appearing left adrenal adenomas noted on 01/13/15 CT  . Allergy    seasonal  . Aortic stenosis    mild to moderate by echo and cath in 09/2016  . Arthritis   . BPH (benign prostatic hyperplasia)   . Carotid artery occlusion   . Cataract   . Chronic back pain   . COPD (chronic obstructive  pulmonary disease) ([place])   . Coronary artery disease   . CVA (cerebral infarction)   . Diverticulosis   . Hypertension   . Nocturia   . Peripheral vascular disease ([place])   . Sleep apnea    wears CPAP  . Stroke [facility]) 2013   tia        Objective:      amb wm gruff voice  Wt Readings from Last 3 Encounters:  04/07/19 223 lb (101.2 kg)  02/24/19 222 lb 12.8 oz (101.1 kg)  11/18/18 225 lb (102.1 kg)     BP 122/78 (BP Location: Left Arm, Cuff Size: Normal)   Pulse 85   Temp 98.2 F (36.8 C) (Temporal)   Ht 5' 6.5" (1.689 m)   Wt 223 lb (101.2 kg)   SpO2 93% Comment: on RA  BMI 35.45 kg/m      HEENT : pt wearing mask not removed for exam due to covid - 19 concerns.    NECK :  without JVD/Nodes/TM/ nl carotid upstrokes bilaterally   LUNGS: no acc muscle use,  Mild barrel  contour chest wall with bilateral  Distant bs s audible wheeze and  without cough on insp or exp maneuvers  and mild  Hyperresonant  to  percussion bilaterally     CV:  RRR  no s3 with II-III/VI sem s increase in P2, and no edema   ABD:  soft and nontender with pos end  insp Hoover's  in the supine position. No bruits or organomegaly appreciated, bowel sounds nl  MS:   Nl gait/  ext warm without deformities, calf tenderness, cyanosis or clubbing No obvious joint restrictions   SKIN: warm and dry without lesions    NEURO:  alert, approp, nl sensorium with  no motor or cerebellar deficits apparent.         CXR PA and Lateral:   04/07/2019 :    I personally reviewed images and agree with radiology impression as follows:    1. Findings consistent with emphysema/COPD, similar in comparison to prior radiographs. 2. No acute cardiopulmonary disease. 3. Central pulmonary arterial enlargement suggesting pulmonary artery hypertension.    Assessment

## 2019-04-07 NOTE — Progress Notes (Signed)
Spoke with pt and notified of results per Dr. Wert. Pt verbalized understanding and denied any questions. 

## 2019-04-07 NOTE — Progress Notes (Signed)
mychart result note sent

## 2019-04-24 ENCOUNTER — Ambulatory Visit (INDEPENDENT_AMBULATORY_CARE_PROVIDER_SITE_OTHER): Payer: Medicare Other | Admitting: Podiatry

## 2019-04-24 ENCOUNTER — Other Ambulatory Visit: Payer: Self-pay

## 2019-04-24 VITALS — Temp 99.1°F

## 2019-04-24 DIAGNOSIS — D689 Coagulation defect, unspecified: Secondary | ICD-10-CM | POA: Diagnosis not present

## 2019-04-24 DIAGNOSIS — B351 Tinea unguium: Secondary | ICD-10-CM | POA: Diagnosis not present

## 2019-05-04 ENCOUNTER — Encounter: Payer: Self-pay | Admitting: Podiatry

## 2019-05-04 NOTE — Progress Notes (Signed)
Subjective: Kyle Lewis is a 73 y.o. y.o. male who presents for preventative foot care today with h/o coagulation defect and painful, discolored, thick toenails  which interfere with daily activities. Pain is aggravated when wearing enclosed shoe gear. Pain is relieved with periodic professional debridement.  Kyle Poot, MD is his PCP.   Current Outpatient Medications on File Prior to Visit  Medication Sig Dispense Refill  . albuterol (VENTOLIN HFA) 108 (90 Base) MCG/ACT inhaler Inhale 2 puffs into the lungs every 6 (six) hours as needed for wheezing or shortness of breath. 6.7 g 2  . ALPRAZolam (XANAX) 0.5 MG tablet Take 0.5 mg by mouth at bedtime as needed for anxiety.    Marland Kitchen amLODipine (NORVASC) 5 MG tablet     . clopidogrel (PLAVIX) 300 MG TABS tablet Take 300 mg by mouth daily.     . finasteride (PROSCAR) 5 MG tablet Take 5 mg by mouth daily.    Marland Kitchen loperamide (IMODIUM A-D) 2 MG capsule Take 2 mg by mouth as needed.     Marland Kitchen olmesartan (BENICAR) 40 MG tablet Take 40 mg by mouth daily.    . rosuvastatin (CRESTOR) 40 MG tablet rosuvastatin 40 mg tablet    . Tamsulosin HCl (FLOMAX) 0.4 MG CAPS Take 0.4 mg by mouth daily after supper.      No current facility-administered medications on file prior to visit.     Allergies  Allergen Reactions  . Cefazolin Rash    The patient had surgery and was given cefazolin intraop. ~ 10 days later he developed a rash confirmed by biopsy to be consistent w/ drug eruption. We cannot know for sure, but this is the most likely agent.     Objective: Vitals:   04/24/19 0926  Temp: 99.1 F (37.3 C)   Vascular Examination: Capillary refill time immediate x 10 digits.  Dorsalis pedis pulses palpable b/l.   Posterior tibial pulses palpable b/l.  Digital hair present b/l.  Skin temperature WNL b/l.  Dermatological Examination: Skin with normal turgor, texture and tone b/l.  Toenails 1-5 b/l discolored, thick, dystrophic with subungual debris  and pain with palpation to nailbeds due to thickness of nails.  Musculoskeletal: Muscle strength 5/5 to all LE muscle groups.  Rigid hammertoe deformity right 2nd digit.  Neurological: Sensation intact 5/5 b/l with 10 gram monofilament.  Vibratory sensation intact b/l.  Assessment: 1. Painful onychomycosis toenails 1-5 b/l 2. Coagulation defect  Plan: 1. Toenails 1-5 b/l were debrided in length and girth without iatrogenic bleeding. 2. Patient to continue soft, supportive shoe gear daily. 3. Patient to report any pedal injuries to medical professional immediately. 4. Follow up 3 months.  5. Patient/POA to call should there be a concern in the interim.

## 2019-05-06 DIAGNOSIS — R030 Elevated blood-pressure reading, without diagnosis of hypertension: Secondary | ICD-10-CM | POA: Insufficient documentation

## 2019-05-06 DIAGNOSIS — M461 Sacroiliitis, not elsewhere classified: Secondary | ICD-10-CM

## 2019-05-06 HISTORY — DX: Sacroiliitis, not elsewhere classified: M46.1

## 2019-05-14 ENCOUNTER — Telehealth: Payer: Self-pay | Admitting: Cardiology

## 2019-05-14 NOTE — Telephone Encounter (Signed)
Called patient to schedule recall that patient cancelled and wife states he in not ready to schedule and will call back at a later time.

## 2019-05-23 ENCOUNTER — Telehealth: Payer: Self-pay | Admitting: Cardiology

## 2019-05-23 NOTE — Telephone Encounter (Signed)
I contacted wife to schedule her recall with Dr. Stanford Breed. While on the phone she asked to speak with Edmon Crape in regards to her husbands appt.

## 2019-07-29 ENCOUNTER — Ambulatory Visit: Payer: Medicare Other | Admitting: Podiatry

## 2019-07-31 DIAGNOSIS — R269 Unspecified abnormalities of gait and mobility: Secondary | ICD-10-CM

## 2019-07-31 HISTORY — DX: Unspecified abnormalities of gait and mobility: R26.9

## 2019-08-04 ENCOUNTER — Other Ambulatory Visit: Payer: Self-pay | Admitting: Student

## 2019-08-04 ENCOUNTER — Other Ambulatory Visit: Payer: Self-pay

## 2019-08-04 ENCOUNTER — Ambulatory Visit (INDEPENDENT_AMBULATORY_CARE_PROVIDER_SITE_OTHER): Payer: Medicare Other | Admitting: Podiatry

## 2019-08-04 DIAGNOSIS — D689 Coagulation defect, unspecified: Secondary | ICD-10-CM

## 2019-08-04 DIAGNOSIS — R269 Unspecified abnormalities of gait and mobility: Secondary | ICD-10-CM

## 2019-08-04 DIAGNOSIS — B351 Tinea unguium: Secondary | ICD-10-CM | POA: Diagnosis not present

## 2019-08-04 NOTE — Progress Notes (Signed)
  Subjective:  Patient ID: Kyle Lewis, male    DOB: [birth year],  MRN: [id number]  Chief Complaint  Patient presents with  . debride    routine foot care -pt on plavix    74 y.o. male presents with the above complaint. History confirmed with patient.   Objective:  Physical Exam: warm, good capillary refill, nail exam onychomycosis of the toenails, no trophic changes or ulcerative lesions. DP pulses palpable, PT pulses palpable and protective sensation intact Left Foot: normal exam, no swelling, tenderness, instability; ligaments intact, full range of motion of all ankle/foot joints  Right Foot: normal exam, no swelling, tenderness, instability; ligaments intact, full range of motion of all ankle/foot joints   No images are attached to the encounter.  Assessment:   1. Onychomycosis   2. Coagulation defect [facility])      Plan:  Patient was evaluated and treated and all questions answered.  Onychomycosis and Coagulation Defect -Nails palliatively debrided secondary to pain  Procedure: Nail Debridement Rationale: Patient meets criteria for routine foot care due to coagulation defect Type of Debridement: manual, sharp debridement. Instrumentation: Nail nipper, rotary burr. Number of Nails: 10    Return in about 4 weeks (around 09/01/2019).

## 2019-08-06 ENCOUNTER — Other Ambulatory Visit: Payer: Self-pay | Admitting: Student

## 2019-09-02 ENCOUNTER — Ambulatory Visit (INDEPENDENT_AMBULATORY_CARE_PROVIDER_SITE_OTHER): Payer: Medicare Other | Admitting: Podiatry

## 2019-09-02 ENCOUNTER — Other Ambulatory Visit: Payer: Self-pay

## 2019-09-02 DIAGNOSIS — D689 Coagulation defect, unspecified: Secondary | ICD-10-CM

## 2019-09-02 DIAGNOSIS — B351 Tinea unguium: Secondary | ICD-10-CM | POA: Diagnosis not present

## 2019-09-04 ENCOUNTER — Other Ambulatory Visit: Payer: Self-pay

## 2019-09-04 ENCOUNTER — Ambulatory Visit
Admission: RE | Admit: 2019-09-04 | Discharge: 2019-09-04 | Disposition: A | Discharge status: Home / Self Care | Payer: Medicare Other | Source: Ambulatory Visit | Attending: Student | Admitting: Student

## 2019-09-04 DIAGNOSIS — R269 Unspecified abnormalities of gait and mobility: Secondary | ICD-10-CM

## 2019-09-04 MED ORDER — GADOBENATE DIMEGLUMINE 529 MG/ML IV SOLN
20.0000 mL | Freq: Once | INTRAVENOUS | Status: AC | PRN
  Administered 2019-09-04: 20 mL via INTRAVENOUS

## 2019-09-25 ENCOUNTER — Encounter: Payer: Self-pay | Admitting: Neurology

## 2019-09-25 ENCOUNTER — Ambulatory Visit (INDEPENDENT_AMBULATORY_CARE_PROVIDER_SITE_OTHER): Payer: Medicare Other | Admitting: Neurology

## 2019-09-25 ENCOUNTER — Other Ambulatory Visit: Payer: Self-pay

## 2019-09-25 VITALS — BP 176/80 | HR 86 | Temp 97.7°F | Ht 66.0 in | Wt 243.0 lb

## 2019-09-25 DIAGNOSIS — G3184 Mild cognitive impairment, so stated: Secondary | ICD-10-CM | POA: Diagnosis not present

## 2019-09-25 DIAGNOSIS — G5793 Unspecified mononeuropathy of bilateral lower limbs: Secondary | ICD-10-CM

## 2019-09-25 DIAGNOSIS — R413 Other amnesia: Secondary | ICD-10-CM | POA: Diagnosis not present

## 2019-09-25 DIAGNOSIS — R269 Unspecified abnormalities of gait and mobility: Secondary | ICD-10-CM
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
## 2019-09-25 NOTE — Patient Instructions (Signed)
I had a long discussion with the patient as well as his wife whom I spoke to over the phone regarding his subacute symptoms of worsening gait and balance difficulties which I think are multifactorial due to combination of underlying peripheral neuropathy as well as residual radiculopathy from his degenerative back disease as well as age-related changes of small vessel disease.  Recommend further evaluation by checking EMG nerve conduction studies, neuropathy panel labs as well as referral to physical therapy for gait and balance training.  He also has mild cognitive impairment but is worried about family history of Alzheimer's.  We will check lab work for reversible causes of memory loss and EEG.  We also discussed memory compensation strategies.  He will return for follow-up in the future in 2 months or call earlier if necessary Memory Compensation Strategies  1. Use "WARM" strategy.  W= write it down  A= associate it  R= repeat it  M= make a mental note  2.   You can keep a Social worker.  Use a 3-ring notebook with sections for the following: calendar, important names and phone numbers,  medications, doctors' names/phone numbers, lists/reminders, and a section to journal what you did  each day.   3.    Use a calendar to write appointments down.  4.    Write yourself a schedule for the day.  This can be placed on the calendar or in a separate section of the Memory Notebook.  Keeping a  regular schedule can help memory.  5.    Use medication organizer with sections for each day or morning/evening pills.  You may need help loading it  6.    Keep a basket, or pegboard by the door.  Place items that you need to take out with you in the basket or on the pegboard.  You may also want to  include a message board for reminders.  7.    Use sticky notes.  Place sticky notes with reminders in a place where the task is performed.  For example: " turn off the  stove" placed by the stove, "lock the  door" placed on the door at eye level, " take your medications" on  the bathroom mirror or by the place where you normally take your medications.  8.    Use alarms/timers.  Use while cooking to remind yourself to check on food or as a reminder to take your medicine, or as a  reminder to make a call, or as a reminder to perform another task, etc. .

## 2019-09-29 LAB — NEUROPATHY PANEL
A/G Ratio: 1.4 (ref 0.7–1.7)
Albumin ELP: 3.7 g/dL (ref 2.9–4.4)
Alpha 1: 0.2 g/dL (ref 0.0–0.4)
Alpha 2: 0.6 g/dL (ref 0.4–1.0)
Angio Convert Enzyme: 38 U/L (ref 14–82)
Anti Nuclear Antibody (ANA): NEGATIVE
Beta: 1.1 g/dL (ref 0.7–1.3)
Gamma Globulin: 0.7 g/dL (ref 0.4–1.8)
Globulin, Total: 2.7 g/dL (ref 2.2–3.9)
Rhuematoid fact SerPl-aCnc: <10 IU/mL (ref 0.0–13.9)
Sed Rate: 8 mm/hr (ref 0–30)
TSH: 1.86 u[IU]/mL (ref 0.450–4.500)
Total Protein: 6.4 g/dL (ref 6.0–8.5)
Vit D, 25-Hydroxy: 26.5 ng/mL — ABNORMAL LOW (ref 30.0–100.0)
Vitamin B-12: 232 pg/mL (ref 232–1245)

## 2019-09-29 LAB — HEMOGLOBIN A1C
Est. average glucose Bld gHb Est-mCnc: 111 mg/dL
Hgb A1c MFr Bld: 5.5 % (ref 4.8–5.6)

## 2019-09-29 LAB — DEMENTIA PANEL
Homocysteine: 13.8 umol/L (ref 0.0–19.2)
RPR Ser Ql: NONREACTIVE

## 2019-10-10 NOTE — Progress Notes (Signed)
Kindly inform the patient that lab work for reversible causes of neuropathy and memory loss fairly unremarkable except for low vitamin D levels.  He needs to see his primary care physician to discuss treatment option for vitamin D replacement

## 2019-10-15 ENCOUNTER — Telehealth: Payer: Self-pay

## 2019-10-15 NOTE — Telephone Encounter (Signed)
I called pts wife on dpr about pts lab work.I stated the memory and neuropathy panel were unremarkable except for low vitamin d levels. I stated per D.Leonie Man pt needs to seek primary doctor for treatment for this. I explain to wife this will be fax and sent electronically and to call the office this week for their recommendations. The wife verbalized understanding

## 2019-10-15 NOTE — Telephone Encounter (Signed)
-----   Message from Garvin Fila, MD sent at 10/10/2019  1:38 PM EDT ----- Kyle Lewis inform the patient that lab work for reversible causes of neuropathy and memory loss fairly unremarkable except for low vitamin D levels.  He needs to see his primary care physician to discuss treatment option for vitamin D replacement

## 2019-10-15 NOTE — Telephone Encounter (Signed)
Labs routed to PCP about vitamin d.

## 2019-10-23 ENCOUNTER — Ambulatory Visit (INDEPENDENT_AMBULATORY_CARE_PROVIDER_SITE_OTHER): Payer: Medicare Other | Admitting: Diagnostic Neuroimaging

## 2019-10-23 ENCOUNTER — Other Ambulatory Visit: Payer: Self-pay

## 2019-10-23 ENCOUNTER — Encounter (INDEPENDENT_AMBULATORY_CARE_PROVIDER_SITE_OTHER): Payer: Medicare Other | Admitting: Diagnostic Neuroimaging

## 2019-10-23 DIAGNOSIS — Z0289 Encounter for other administrative examinations: Secondary | ICD-10-CM

## 2019-10-23 DIAGNOSIS — G5793 Unspecified mononeuropathy of bilateral lower limbs: Secondary | ICD-10-CM

## 2019-10-27 ENCOUNTER — Other Ambulatory Visit: Payer: Medicare Other

## 2019-10-27 ENCOUNTER — Encounter: Payer: Self-pay | Admitting: Neurology

## 2019-11-04 ENCOUNTER — Telehealth: Payer: Self-pay | Admitting: Neurology

## 2019-11-06 NOTE — Procedures (Signed)
[facility]  NCS (NERVE CONDUCTION STUDY) WITH EMG (ELECTROMYOGRAPHY) REPORT   STUDY DATE: 10/23/19 PATIENT NAME: Kyle Lewis DOB: 16-Oct-1945 MRN: [id number]  ORDERING CLINICIAN: Antony Contras, MD   TECHNOLOGIST: Sherre Scarlet ELECTROMYOGRAPHER: Earlean Polka. Calan Doren, MD  CLINICAL INFORMATION: 74 year old male with low back pain, unsteady gait, evaluate for neuropathy or lumbar radiculopathy.  FINDINGS: NERVE CONDUCTION STUDY:  Bilateral peroneal and right tibial motor responses of decreased amplitudes.  Right tibial motor response also has slightly slow conduction velocity.  Left tibial motor response is normal.  Bilateral sural and superficial peroneal sensory responses are normal.  Bilateral tibial F wave latencies are normal.   NEEDLE ELECTROMYOGRAPHY:  Needle examination of left lower extremity notable for chronic denervation in left tibialis anterior and left gastrocnemius muscles on exertion.  Abnormal spontaneous activity noted in left tibialis anterior at rest.  Lumbar paraspinal muscles deferred due to patient's history of low back surgery with metal hardware.   IMPRESSION:   Abnormal study demonstrating: - Decreased amplitudes in motor responses of bilateral lower extremities and chronic denervation changes on needle EMG which suggest bilateral lumbar radiculopathies (L5-S1).     INTERPRETING PHYSICIAN:  Penni Bombard, MD Certified in Neurology, Neurophysiology and Neuroimaging  [facility] [address], [place], [place] [address] [phone]   [facility]    Nerve / Sites Muscle Latency Ref. Amplitude Ref. Rel Amp Segments Distance Velocity Ref. Area    ms ms mV mV %  cm m/s m/s mVms  R Peroneal - EDB     Ankle EDB 4.5 ?6.5 1.8 ?2.0 100 Ankle - EDB 9   7.0     Fib head EDB 10.1  1.5  81 Fib head - Ankle 26 46 ?44 5.5     Pop fossa EDB 12.2  1.4  96 Pop fossa - Fib head 10 47 ?44 5.1         Pop fossa -  Ankle      L Peroneal - EDB     Ankle EDB 4.1 ?6.5 1.7 ?2.0 100 Ankle - EDB 9   4.1     Fib head EDB 10.3  1.9  118 Fib head - Ankle 27 44 ?44 5.5     Pop fossa EDB 12.5  1.9  97 Pop fossa - Fib head 10 44 ?44 5.5         Pop fossa - Ankle      R Tibial - AH     Ankle AH 3.8 ?5.8 0.6 ?4.0 100 Ankle - AH 9   2.3     Pop fossa AH 14.3  0.4  62.9 Pop fossa - Ankle 37 35 ?41 1.7  L Tibial - AH     Ankle AH 4.4 ?5.8 4.8 ?4.0 100 Ankle - AH 9   11.3     Pop fossa AH 12.2  2.5  52.3 Pop fossa - Ankle 38 48 ?41 11.9             SNC    Nerve / Sites Rec. Site Peak Lat Ref.  Amp Ref. Segments Distance    ms ms V V  cm  R Sural - Ankle (Calf)     Calf Ankle 3.4 ?4.4 6 ?6 Calf - Ankle 14  L Sural - Ankle (Calf)     Calf Ankle 3.5 ?4.4 7 ?6 Calf - Ankle 14  R Superficial peroneal - Ankle     Lat leg Ankle  4.0 ?4.4 6 ?6 Lat leg - Ankle 14  L Superficial peroneal - Ankle     Lat leg Ankle 3.9 ?4.4 7 ?6 Lat leg - Ankle 14             F  Wave    Nerve F Lat Ref.   ms ms  R Tibial - AH 55.1 ?56.0  L Tibial - AH 53.4 ?56.0         EMG Summary Table    Spontaneous MUAP Recruitment  Muscle IA Fib PSW Fasc Other Amp Dur. Poly Pattern  L. Vastus medialis Normal None None None _______ Normal Normal Normal Normal  L. Tibialis anterior Normal 3+ 3+ None _______ Increased Normal Normal Discrete  L. Gastrocnemius (Medial head) Increased None None None _______ Increased Normal Normal Discrete

## 2019-12-01 ENCOUNTER — Ambulatory Visit: Payer: Medicare Other | Admitting: Neurology

## 2019-12-05 NOTE — Progress Notes (Signed)
Kindly inform patient that EMG nerve conduction study shows no evidence of neuropathy but pinched nerves in his back on both sides

## 2019-12-07 NOTE — Progress Notes (Signed)
  Subjective:  Patient ID: Kyle Lewis, male    DOB: 1945-08-05,  MRN: [id number]  Chief Complaint  Patient presents with  . debride    routine foot care -plavix     74 y.o. male presents with the above complaint. History confirmed with patient.   Objective:  Physical Exam: warm, good capillary refill, nail exam onychomycosis of the toenails, no trophic changes or ulcerative lesions. DP pulses palpable, PT pulses palpable and protective sensation intact Left Foot: normal exam, no swelling, tenderness, instability; ligaments intact, full range of motion of all ankle/foot joints  Right Foot: normal exam, no swelling, tenderness, instability; ligaments intact, full range of motion of all ankle/foot joints   No images are attached to the encounter.  Assessment:   1. 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  Onychomycosis and Coagulation Defect -Nails palliatively debrided secondary to pain   Procedure: Nail Debridement Rationale: Patient meets criteria for routine foot care due to coag defect Type of Debridement: manual, sharp debridement. Instrumentation: Nail nipper, rotary burr. Number of Nails: 10      No follow-ups on file.

## 2019-12-15 ENCOUNTER — Ambulatory Visit: Payer: Medicare Other | Admitting: Podiatry

## 2019-12-22 ENCOUNTER — Ambulatory Visit: Payer: Medicare Other | Admitting: Podiatry

## 2020-01-01 ENCOUNTER — Ambulatory Visit: Payer: Medicare Other | Admitting: Neurology

## 2020-01-12 ENCOUNTER — Ambulatory Visit (INDEPENDENT_AMBULATORY_CARE_PROVIDER_SITE_OTHER): Payer: Medicare Other | Admitting: Neurology

## 2020-01-12 ENCOUNTER — Encounter: Payer: Self-pay | Admitting: Neurology

## 2020-01-12 ENCOUNTER — Other Ambulatory Visit: Payer: Self-pay

## 2020-01-12 VITALS — BP 141/73 | HR 60 | Ht 66.0 in | Wt 238.0 lb

## 2020-01-12 DIAGNOSIS — M5416 Radiculopathy, lumbar region: Secondary | ICD-10-CM

## 2020-01-12 DIAGNOSIS — G3184 Mild cognitive impairment, so stated: Secondary | ICD-10-CM
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
## 2020-01-12 NOTE — Patient Instructions (Signed)
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  Recommend he follow-up with Dr. Amaryllis Dyke his neurosurgeon to see if he may be a candidate for further surgery to help his back pain and gait.  Continue Plavix for stroke prevention with strict control of hypertension blood pressure goal below 130/90 and lipids with LDL cholesterol goal below 70 mg percent.  I encouraged him to continue to participate in cognitively challenging activities like solving crossword puzzles, playing bridge and sodoku.  He will return for follow-up in the future in 1 year or call earlier if necessary.

## 2020-01-19 ENCOUNTER — Ambulatory Visit: Payer: Medicare Other | Admitting: Podiatry

## 2020-01-22 ENCOUNTER — Other Ambulatory Visit: Payer: Self-pay

## 2020-01-22 ENCOUNTER — Ambulatory Visit (INDEPENDENT_AMBULATORY_CARE_PROVIDER_SITE_OTHER): Payer: Medicare Other | Admitting: Podiatry

## 2020-01-22 DIAGNOSIS — B351 Tinea unguium: Secondary | ICD-10-CM | POA: Diagnosis not present

## 2020-01-22 DIAGNOSIS — D689 Coagulation defect, unspecified: Secondary | ICD-10-CM | POA: Diagnosis not present

## 2020-01-22 NOTE — Progress Notes (Signed)
  Subjective:  Patient ID: Kyle Lewis, male    DOB: January 08, 1946,  MRN: [id number]  Chief Complaint  Patient presents with  . debride    RFC -pt still on plavix    74 y.o. male presents with the above complaint. History confirmed with patient.   Objective:  Physical Exam: warm, good capillary refill, nail exam onychomycosis of the toenails, no trophic changes or ulcerative lesions. DP pulses palpable, PT pulses palpable and protective sensation intact Left Foot: normal exam, no swelling, tenderness, instability; ligaments intact, full range of motion of all ankle/foot joints  Right Foot: normal exam, no swelling, tenderness, instability; ligaments intact, full range of motion of all ankle/foot joints   No images are attached to the encounter.  Assessment:   1. Onychomycosis   2. 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  Onychomycosis and Coagulation Defect -Nails palliatively debrided secondary to pain   Procedure: Nail Debridement Rationale: Patient meets criteria for routine foot care due to coag defect Type of Debridement: manual, sharp debridement. Instrumentation: Nail nipper, rotary burr. Number of Nails: 10  Return in about 3 months (around 04/23/2020) for Routine Foot Care - on Anticoagulant.

## 2020-01-29 ENCOUNTER — Other Ambulatory Visit: Payer: Self-pay | Admitting: Neurological Surgery

## 2020-01-29 DIAGNOSIS — M545 Low back pain, unspecified: Secondary | ICD-10-CM

## 2020-02-10 ENCOUNTER — Ambulatory Visit
Admission: RE | Admit: 2020-02-10 | Discharge: 2020-02-10 | Disposition: A | Discharge status: Home / Self Care | Payer: Medicare Other | Source: Ambulatory Visit | Attending: Neurological Surgery | Admitting: Neurological Surgery

## 2020-02-10 ENCOUNTER — Other Ambulatory Visit: Payer: Self-pay

## 2020-02-10 DIAGNOSIS — M545 Low back pain, unspecified: Secondary | ICD-10-CM

## 2020-03-24 ENCOUNTER — Telehealth: Payer: Self-pay | Admitting: Neurology

## 2020-03-24 NOTE — Telephone Encounter (Signed)
I think we discuss PET scan to make diagnosis of Alzheimer's early I do not think Medicare ordering on his insurance will still cover it.  If his memory difficulties are getting worse I could see him and start him on medications like Aricept or Namenda.  I do not strongly believe the test is necessary

## 2020-03-24 NOTE — Telephone Encounter (Signed)
Thier,Rhonda(wife on DPR) is asking for a call to discuss a certain test that Dr Leonie Man ordered for pt months ago (a test that cost around $3,000.00) wife states at the time they were unable to afford.  Wife states now this will be covered thru an insurance and she would like the name of the [place] office that referral was sent to, please call.
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
## 2020-03-25 NOTE — Telephone Encounter (Signed)
Called and LVM to return call.

## 2020-03-25 NOTE — Telephone Encounter (Signed)
The wife of pt has called Erika,RN back.  Please call

## 2020-04-07 DIAGNOSIS — Z6834 Body mass index (BMI) 34.0-34.9, adult: Secondary | ICD-10-CM

## 2020-04-07 HISTORY — DX: Body mass index (BMI) 34.0-34.9, adult: Z68.34

## 2020-04-08 ENCOUNTER — Other Ambulatory Visit: Payer: Self-pay

## 2020-04-08 ENCOUNTER — Encounter: Payer: Self-pay | Admitting: Internal Medicine

## 2020-04-08 ENCOUNTER — Ambulatory Visit (INDEPENDENT_AMBULATORY_CARE_PROVIDER_SITE_OTHER): Payer: Medicare Other | Admitting: Internal Medicine

## 2020-04-08 VITALS — BP 130/82 | HR 60 | Temp 98.1°F | Ht 61.0 in | Wt 227.0 lb

## 2020-04-08 DIAGNOSIS — G8929 Other chronic pain: Secondary | ICD-10-CM

## 2020-04-08 DIAGNOSIS — J449 Chronic obstructive pulmonary disease, unspecified: Secondary | ICD-10-CM

## 2020-04-08 DIAGNOSIS — R413 Other amnesia: Secondary | ICD-10-CM

## 2020-04-08 DIAGNOSIS — Z23 Encounter for immunization: Secondary | ICD-10-CM

## 2020-04-08 DIAGNOSIS — E785 Hyperlipidemia, unspecified: Secondary | ICD-10-CM

## 2020-04-08 DIAGNOSIS — N401 Enlarged prostate with lower urinary tract symptoms: Secondary | ICD-10-CM

## 2020-04-08 DIAGNOSIS — I10 Essential (primary) hypertension: Secondary | ICD-10-CM

## 2020-04-08 DIAGNOSIS — F419 Anxiety disorder, unspecified: Secondary | ICD-10-CM

## 2020-04-08 DIAGNOSIS — M5442 Lumbago with sciatica, left side: Secondary | ICD-10-CM

## 2020-04-08 DIAGNOSIS — Z1159 Encounter for screening for other viral diseases: Secondary | ICD-10-CM

## 2020-04-08 DIAGNOSIS — I350 Nonrheumatic aortic (valve) stenosis: Secondary | ICD-10-CM

## 2020-04-08 DIAGNOSIS — N138 Other obstructive and reflux uropathy: Secondary | ICD-10-CM

## 2020-04-08 DIAGNOSIS — Z9889 Other specified postprocedural states: Secondary | ICD-10-CM

## 2020-04-08 DIAGNOSIS — G2581 Restless legs syndrome: Secondary | ICD-10-CM

## 2020-04-08 DIAGNOSIS — G4733 Obstructive sleep apnea (adult) (pediatric): Secondary | ICD-10-CM

## 2020-04-08 DIAGNOSIS — I2510 Atherosclerotic heart disease of native coronary artery without angina pectoris: Secondary | ICD-10-CM

## 2020-04-08 DIAGNOSIS — Z8679 Personal history of other diseases of the circulatory system: Secondary | ICD-10-CM

## 2020-04-08 HISTORY — DX: Anxiety disorder, unspecified: F41.9

## 2020-04-08 MED ORDER — FINASTERIDE 5 MG PO TABS: 5.0000 mg | ORAL_TABLET | Freq: Every day | ORAL | 1 refills | Status: DC

## 2020-04-08 MED ORDER — GABAPENTIN 300 MG PO CAPS: 300.0000 mg | ORAL_CAPSULE | Freq: Every day | ORAL | 1 refills | Status: DC

## 2020-04-08 NOTE — Patient Instructions (Signed)
Please get your covid booster at the pharmacy A few weeks later, go back for your tdap (tetanus) booster.
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
## 2020-04-12 LAB — COMPLETE METABOLIC PANEL WITH GFR
AG Ratio: 1.9 (calc) (ref 1.0–2.5)
ALT: 16 U/L (ref 9–46)
AST: 16 U/L (ref 10–35)
Albumin: 4.2 g/dL (ref 3.6–5.1)
Alkaline phosphatase (APISO): 73 U/L (ref 35–144)
BUN: 16 mg/dL (ref 7–25)
CO2: 27 mmol/L (ref 20–32)
Calcium: 9.1 mg/dL (ref 8.6–10.3)
Chloride: 104 mmol/L (ref 98–110)
Creat: 0.72 mg/dL (ref 0.70–1.18)
GFR, Est African American: 107 mL/min/{1.73_m2} (ref >=60)
GFR, Est Non African American: 93 mL/min/{1.73_m2} (ref >=60)
Globulin: 2.2 g/dL (calc) (ref 1.9–3.7)
Glucose, Bld: 104 mg/dL — ABNORMAL HIGH (ref 65–99)
Potassium: 4.1 mmol/L (ref 3.5–5.3)
Sodium: 141 mmol/L (ref 135–146)
Total Bilirubin: 0.6 mg/dL (ref 0.2–1.2)
Total Protein: 6.4 g/dL (ref 6.1–8.1)

## 2020-04-12 LAB — HEPATITIS C ANTIBODY
Hepatitis C Ab: NONREACTIVE
SIGNAL TO CUT-OFF: 0.01 (ref <1.00)

## 2020-04-12 LAB — CBC WITH DIFFERENTIAL/PLATELET
Absolute Monocytes: 455 cells/uL (ref 200–950)
Basophils Absolute: 21 cells/uL (ref 0–200)
Basophils Relative: 0.3 %
Eosinophils Absolute: 83 cells/uL (ref 15–500)
Eosinophils Relative: 1.2 %
HCT: 47.8 % (ref 38.5–50.0)
Hemoglobin: 16.5 g/dL (ref 13.2–17.1)
Lymphs Abs: 1387 cells/uL (ref 850–3900)
MCH: 32 pg (ref 27.0–33.0)
MCHC: 34.5 g/dL (ref 32.0–36.0)
MCV: 92.6 fL (ref 80.0–100.0)
MPV: 9 fL (ref 7.5–12.5)
Monocytes Relative: 6.6 %
Neutro Abs: 4954 cells/uL (ref 1500–7800)
Neutrophils Relative %: 71.8 %
Platelets: 216 10*3/uL (ref 140–400)
RBC: 5.16 10*6/uL (ref 4.20–5.80)
RDW: 13.6 % (ref 11.0–15.0)
Total Lymphocyte: 20.1 %
WBC: 6.9 10*3/uL (ref 3.8–10.8)

## 2020-04-12 LAB — LIPID PANEL
Cholesterol: 161 mg/dL (ref <200)
HDL: 49 mg/dL (ref >=40)
LDL Cholesterol (Calc): 95 mg/dL (calc)
Non-HDL Cholesterol (Calc): 112 mg/dL (calc) (ref <130)
Total CHOL/HDL Ratio: 3.3 (calc) (ref <5.0)
Triglycerides: 81 mg/dL (ref <150)

## 2020-04-12 LAB — FOLATE: Folate: 11.9 ng/mL

## 2020-04-12 LAB — VITAMIN B12: Vitamin B-12: 299 pg/mL (ref 200–1100)

## 2020-04-12 LAB — TSH: TSH: 1.54 mIU/L (ref 0.40–4.50)

## 2020-04-15 ENCOUNTER — Other Ambulatory Visit: Payer: Self-pay | Admitting: *Deleted

## 2020-04-15 MED ORDER — OLMESARTAN MEDOXOMIL 40 MG PO TABS
40.0000 mg | ORAL_TABLET | Freq: Every day | ORAL | 1 refills | Status: DC
Start: 2020-04-15 — End: 2020-07-19

## 2020-04-15 NOTE — Telephone Encounter (Signed)
Patient requested refill

## 2020-04-26 ENCOUNTER — Ambulatory Visit: Payer: Medicare Other | Admitting: Podiatry

## 2020-05-06 ENCOUNTER — Encounter: Payer: Self-pay | Admitting: Podiatry

## 2020-05-06 ENCOUNTER — Ambulatory Visit (INDEPENDENT_AMBULATORY_CARE_PROVIDER_SITE_OTHER): Payer: Medicare Other | Admitting: Podiatry

## 2020-05-06 ENCOUNTER — Other Ambulatory Visit: Payer: Self-pay

## 2020-05-06 DIAGNOSIS — D689 Coagulation defect, unspecified: Secondary | ICD-10-CM

## 2020-05-06 DIAGNOSIS — B351 Tinea unguium: Secondary | ICD-10-CM

## 2020-05-06 NOTE — Progress Notes (Signed)
  Subjective:  Patient ID: Kyle Lewis, male    DOB: 12/13/1945,  MRN: [id number]  Chief Complaint  Patient presents with  . Nail Problem    trim nails     74 y.o. male presents with the above complaint. History confirmed with patient.   Objective:  Physical Exam: warm, good capillary refill, nail exam onychomycosis of the toenails, no trophic changes or ulcerative lesions. DP pulses palpable, PT pulses palpable and protective sensation intact Left Foot: normal exam, no swelling, tenderness, instability; ligaments intact, full range of motion of all ankle/foot joints  Right Foot: normal exam, no swelling, tenderness, instability; ligaments intact, full range of motion of all ankle/foot joints   No images are attached to the encounter.  Assessment:   1. 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  Onychomycosis and Coagulation Defect -Nails palliatively debrided secondary to pain  Procedure: Nail Debridement Type of Debridement: manual, sharp debridement. Instrumentation: Nail nipper, rotary burr. Number of Nails: 10  No follow-ups on file.

## 2020-05-20 ENCOUNTER — Telehealth: Payer: Self-pay | Admitting: *Deleted

## 2020-05-20 NOTE — Telephone Encounter (Signed)
I started him on the 25mg  dose of myrbetriq which is fine to give more samples and send a Rx.

## 2020-05-20 NOTE — Telephone Encounter (Signed)
Patient called and stated that Dr. Mariea Clonts gave him some Myrebetriq samples and they worked good and he is requesting more samples.   Medication is not in current medication list and patient is unsure of the mg.   Please Advise.    OV Note Dated 04/08/2020: 7. BPH with obstruction/lower urinary tract symptoms - cont flomax, finasteride and will try myrbetriq samples again for OAB/nocturia component since helped before--maybe more affordable than over a year ago - finasteride (PROSCAR) 5 MG tablet; Take 1 tablet (5 mg total) by mouth daily.  Dispense: 90 tablet; Refill: 1

## 2020-05-21 NOTE — Telephone Encounter (Signed)
LMOM to return call.  Medication list updated.

## 2020-05-21 NOTE — Telephone Encounter (Signed)
Patient stated that he will wait for samples and will call back next week to see if we have any.

## 2020-06-03 ENCOUNTER — Encounter: Payer: Self-pay | Admitting: Neurology

## 2020-06-03 ENCOUNTER — Ambulatory Visit (INDEPENDENT_AMBULATORY_CARE_PROVIDER_SITE_OTHER): Payer: Medicare Other | Admitting: Neurology

## 2020-06-03 VITALS — BP 157/81 | HR 65 | Ht 67.0 in | Wt 225.0 lb

## 2020-06-03 DIAGNOSIS — I2510 Atherosclerotic heart disease of native coronary artery without angina pectoris: Secondary | ICD-10-CM

## 2020-06-03 DIAGNOSIS — G3184 Mild cognitive impairment, so stated: Secondary | ICD-10-CM | POA: Diagnosis not present

## 2020-06-03 DIAGNOSIS — R269 Unspecified abnormalities of gait and mobility: Secondary | ICD-10-CM

## 2020-06-03 MED ORDER — CYANOCOBALAMIN 2000 MCG PO TABS: 1000.0000 ug | ORAL_TABLET | Freq: Every day | ORAL | 2 refills | Status: DC
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
## 2020-06-03 NOTE — Patient Instructions (Signed)
I had a long discussion with the patient regarding his chronic gait and balance difficulties and mild cognitive impairment which appear to be stable.  I recommend he use a cane at all times and we discussed fall safety precautions.  I recommend he start taking vitamin B12 1000 mcg daily to correct his low normal vitamin B12 levels this may also help with his gait as well as cognitive impairment.  Recommend increase participation in cognitively challenging activities like solving crossword puzzles, playing bridge and sodoku.  We will continue Plavix for stroke prevention and maintain aggressive risk factor modification with strict control of hypertension with blood pressure goal below 130/90, lipids with LDL cholesterol goal below 70 mg percent and diabetes with hemoglobin A1c goal below 6.5%.  He will return for follow-up in the future in 6 months with my nurse practitioner Shanda Bumps or call earlier if necessary.

## 2020-06-07 DIAGNOSIS — Z952 Presence of prosthetic heart valve: Secondary | ICD-10-CM

## 2020-06-09 ENCOUNTER — Other Ambulatory Visit: Payer: Self-pay | Admitting: *Deleted

## 2020-06-09 DIAGNOSIS — G2581 Restless legs syndrome: Secondary | ICD-10-CM

## 2020-06-09 MED ORDER — TAMSULOSIN HCL 0.4 MG PO CAPS
0.4000 mg | ORAL_CAPSULE | Freq: Every day | ORAL | 1 refills | Status: DC
Start: 2020-06-09 — End: 2020-12-20

## 2020-06-09 MED ORDER — CLOPIDOGREL BISULFATE 75 MG PO TABS
75.0000 mg | ORAL_TABLET | Freq: Every day | ORAL | 1 refills | Status: DC
Start: 2020-06-09 — End: 2020-12-20

## 2020-06-09 MED ORDER — GABAPENTIN 300 MG PO CAPS: 300.0000 mg | ORAL_CAPSULE | Freq: Every day | ORAL | 1 refills | Status: DC

## 2020-06-09 NOTE — Telephone Encounter (Signed)
Patient requested refill

## 2020-06-10 ENCOUNTER — Telehealth: Payer: Self-pay

## 2020-06-10 NOTE — Telephone Encounter (Signed)
   [facility] Pre-operative Risk Assessment    HEARTCARE STAFF: - Please ensure there is not already an duplicate clearance open for this procedure. - Under Visit Info/Reason for Call, type in Other and utilize the format Clearance MM/DD/YY or Clearance TBD. Do not use dashes or single digits. - If request is for dental extraction, please clarify the # of teeth to be extracted.  Request for surgical clearance:  1. What type of surgery is being performed? L5-S1 posterior lumbar interbody fusion with removal of hardware at L2-3   2. When is this surgery scheduled? TBD   3. What type of clearance is required (medical clearance vs. Pharmacy clearance to hold med vs. Both)? Pharmacy  4. Are there any medications that need to be held prior to surgery and how long? Plavix   5. Practice name and name of physician performing surgery? [facility]   6. What is the office phone number? [phone]   7.   What is the office fax number? [phone]  8.   Anesthesia type (None, local, MAC, general) ? None listed   Mady Haagensen 06/10/2020, 5:21 PM  _________________________________________________________________   (provider comments below)

## 2020-06-11 ENCOUNTER — Telehealth: Payer: Self-pay | Admitting: *Deleted

## 2020-06-11 NOTE — Telephone Encounter (Signed)
Received fax from [facility] requesting Preoperative Clearance.  Procedure: L5-S1 Posterior Lumber interbody fusion with removal of Hardware at L2-3 Patient is on Plavix and will need to hold, needing provider to Advise.   Placed paperwork in Dr. Cyndi Lennert folder with OV notes attached.   To be faxed back to [place] at [facility] Fax: [phone]

## 2020-06-11 NOTE — Telephone Encounter (Signed)
   Primary Cardiologist: Jenean Lindau, MD  Chart reviewed as part of pre-operative protocol coverage.   Patient has a history of non-obstructive CAD without prior stent placement. Would be reasonable to hold plavix 5 days prior to his spinal injection with plans to restart as soon as he is cleared to do so by his surgeon.   I will route this recommendation to the requesting party via Epic fax function and remove from pre-op pool.  Please call with questions.  Abigail Butts, PA-C 06/11/2020, 4:21 PM

## 2020-06-14 ENCOUNTER — Telehealth: Payer: Self-pay | Admitting: Emergency Medicine

## 2020-06-14 ENCOUNTER — Telehealth: Payer: Self-pay

## 2020-06-14 NOTE — Telephone Encounter (Signed)
I spoke to patient and he stated he is trying to get in to see the cardiologist, he is just waiting for them to call him back.

## 2020-06-14 NOTE — Telephone Encounter (Signed)
Good because I recommend he get cardiology clearance.  It looks like his last visit with them was virtual in 6/20 and he may need more workup preop.

## 2020-06-14 NOTE — Telephone Encounter (Signed)
Per Dr. Mariea Clonts " I recommend cardiac clearance be obtained for him. Cal him and recommend he schedule a cardio appt. ASAP. I called patient and left a voicemail for him to call back for your recommendation.

## 2020-06-14 NOTE — Telephone Encounter (Signed)
Faxed preop clearance to [facility].  OK transmission received.

## 2020-07-19 ENCOUNTER — Encounter: Payer: Self-pay | Admitting: Internal Medicine

## 2020-07-19 ENCOUNTER — Ambulatory Visit (INDEPENDENT_AMBULATORY_CARE_PROVIDER_SITE_OTHER): Payer: Medicare Other | Admitting: Internal Medicine

## 2020-07-19 ENCOUNTER — Other Ambulatory Visit: Payer: Self-pay

## 2020-07-19 VITALS — BP 110/62 | HR 63 | Temp 97.8°F | Ht 67.0 in | Wt 225.0 lb

## 2020-07-19 DIAGNOSIS — M5442 Lumbago with sciatica, left side: Secondary | ICD-10-CM | POA: Diagnosis not present

## 2020-07-19 DIAGNOSIS — E785 Hyperlipidemia, unspecified: Secondary | ICD-10-CM

## 2020-07-19 DIAGNOSIS — I350 Nonrheumatic aortic (valve) stenosis: Secondary | ICD-10-CM

## 2020-07-19 DIAGNOSIS — N401 Enlarged prostate with lower urinary tract symptoms: Secondary | ICD-10-CM

## 2020-07-19 DIAGNOSIS — G8929 Other chronic pain: Secondary | ICD-10-CM

## 2020-07-19 DIAGNOSIS — J449 Chronic obstructive pulmonary disease, unspecified: Secondary | ICD-10-CM | POA: Diagnosis not present

## 2020-07-19 DIAGNOSIS — N138 Other obstructive and reflux uropathy: Secondary | ICD-10-CM | POA: Diagnosis not present

## 2020-07-19 MED ORDER — FINASTERIDE 5 MG PO TABS: 5.0000 mg | ORAL_TABLET | Freq: Every day | ORAL | 1 refills | Status: DC

## 2020-07-19 MED ORDER — OLMESARTAN MEDOXOMIL 40 MG PO TABS: 40.0000 mg | ORAL_TABLET | Freq: Every day | ORAL | 1 refills | Status: DC

## 2020-07-19 MED ORDER — METHOCARBAMOL 500 MG PO TABS: 500.0000 mg | ORAL_TABLET | Freq: Every evening | ORAL | 0 refills | Status: DC | PRN

## 2020-07-19 NOTE — Patient Instructions (Addendum)
I recommend you get your covid booster asap.  Please call the [place] to find out if you might qualify for disability for paperwork to be done.

## 2020-07-19 NOTE — Progress Notes (Signed)
Location:  [facility] clinic Provider:  Draeden Kellman L. Mariea Clonts, D.O., C.M.D.  Goals of Care:  Advanced Directives 07/19/2020  Does Patient Have a Medical Advance Directive? No  Does patient want to make changes to medical advance directive? No - Patient declined  Would patient like information on creating a medical advance directive? -  Pre-existing out of facility DNR order (yellow form or pink MOST form) -     Chief Complaint  Patient presents with  . Medical Management of Chronic Issues    3 month follow up   . Health Maintenance    Covid Booster and  PNA     HPI: Patient is a 75 y.o. male seen today for medical management of chronic diseases and MMSE.    He told CMA he wants flexeril for sleep  He scored 30/30 on mmse today.  Passed clock.     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  He has periods of unsteadiness on his feet which means he cannot be hired.  He needs a desk job.  He last worked 08/2018.  He used to be in a [employer] for 4 or 6 other people he did not know in close quarters.    His wife has CML--she's immunocompromised so he can't be around people too close.    He is to have another back surgery done with Dr. Ronnald Ramp.  Cardiology did their clearance for it and sent it back.  His prior surgery made him better than he was.  He was hoping he'd be 100% afterward.    He used to use flexeril at night.  He does not have to go far to the toilet to get up to urinate.    Usually the most times he gets up at night are 3.  Usually gets up 2x.  Is on myrbetriq.  Also takes finasteride.    Past Medical History:  Diagnosis Date  . 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 Adenomatous colon polyp   . Adrenal mass ([place])    2 benign appearing left adrenal adenomas noted on 01/13/15 CT  . Allergy    seasonal  . Aortic stenosis    mild to moderate by echo and cath in 09/2016  . Arthritis   . BPH (benign prostatic  hyperplasia)   . Carotid artery occlusion   . Cataract   . Chronic back pain   . COPD (chronic obstructive pulmonary disease) ([place])   . Coronary artery disease   . CVA (cerebral infarction)   . Degeneration of cervical intervertebral disc   . Diverticulosis   . Gout   . History of back surgery    Rods and Screws in back  . History of left knee replacement   . Hypertension   . Low back pain   . Meningioma ([place])   . Nocturia   . Peripheral vascular disease ([place])   . Ringing in ear    (SLIGHT)  . Sleep apnea    wears CPAP  . Stroke [facility]) 2013   tia     Past Surgical History:  Procedure Laterality Date  . APPENDECTOMY    . BACK SURGERY     Rods and Screws in Back  . BRAIN MENINGIOMA EXCISION     menigioma  . CARPAL TUNNEL RELEASE     left  . cataracts     both eyes  . EYE SURGERY Bilateral  Cataract  . INGUINAL HERNIA REPAIR    . JOINT REPLACEMENT Left 04-01-12   Knee  . LEFT HEART CATH AND CORONARY ANGIOGRAPHY N/A 09/15/2016   Procedure: Left Heart Cath and Coronary Angiography;  Surgeon: Nelva Bush, MD;  Location: [place] CV LAB;  Service: Cardiovascular;  Laterality: N/A;  . MAXIMUM ACCESS (MAS)POSTERIOR LUMBAR INTERBODY FUSION (PLIF) 1 LEVEL N/A 07/07/2015   Procedure:  POSTERIOR LUMBAR INTERBODY FUSION (PLIF) Lumbar Four-Five with Pedicle Screw Fixation Lumbar Two-Five;Laminectomy Lumbar Two-Five;  Surgeon: Eustace Moore, MD;  Location: [place] NEURO ORS;  Service: Neurosurgery;  Laterality: N/A;   POSTERIOR LUMBAR INTERBODY FUSION (PLIF) Lumbar Four-Five with Pedicle Screw Fixation Lumbar Two-Five;Laminectomy Lumbar Two-Five  . PROSTATE SURGERY    . RIGHT/LEFT HEART CATH AND CORONARY ANGIOGRAPHY N/A 01/24/2018   Procedure: RIGHT/LEFT HEART CATH AND CORONARY ANGIOGRAPHY;  Surgeon: Nelva Bush, MD;  Location: [place] CV LAB;  Service: Cardiovascular;  Laterality: N/A;  . TONSILLECTOMY    . TOTAL KNEE ARTHROPLASTY  04/01/2012   Procedure: TOTAL KNEE  ARTHROPLASTY;  Surgeon: Gearlean Alf, MD;  Location: [facility];  Service: Orthopedics;  Laterality: Left;  . TRANSURETHRAL RESECTION OF PROSTATE    . UMBILICAL HERNIA REPAIR  2011    Allergies  Allergen Reactions  . Cefazolin Rash    The patient had surgery and was given cefazolin intraop. ~ 10 days later he developed a rash confirmed by biopsy to be consistent w/ drug eruption. We cannot know for sure, but this is the most likely agent.      Outpatient Encounter Medications as of 07/19/2020  Medication Sig  . acetaminophen (TYLENOL) 500 MG tablet Take 500 mg by mouth at bedtime.   Marland Kitchen albuterol (VENTOLIN HFA) 108 (90 Base) MCG/ACT inhaler Inhale 2 puffs into the lungs every 6 (six) hours as needed for wheezing or shortness of breath.  . ALPRAZolam (XANAX) 0.5 MG tablet Take by mouth at bedtime.  Marland Kitchen amLODipine (NORVASC) 5 MG tablet   . AZO-CRANBERRY PO Take by mouth.  Marland Kitchen b complex vitamins capsule Take 1 capsule by mouth daily.  . Cholecalciferol (D-3-5) 125 MCG (5000 UT) capsule Take 5,000 Units by mouth daily.  . clopidogrel (PLAVIX) 75 MG tablet Take 1 tablet (75 mg total) by mouth daily.  . cyanocobalamin 2000 MCG tablet Take 0.5 tablets (1,000 mcg total) by mouth daily.  . fluticasone (FLONASE) 50 MCG/ACT nasal spray   . gabapentin (NEURONTIN) 300 MG capsule Take 1 capsule (300 mg total) by mouth at bedtime.  Marland Kitchen loperamide (IMODIUM) 2 MG capsule Take 2 mg by mouth as needed.   . mirabegron ER (MYRBETRIQ) 25 MG TB24 tablet Take 25 mg by mouth daily.  . Phenazopyridine HCl (AZO TABS PO) Take by mouth.  . Pumpkin Seed (AZO MEN BLADDER CONTROL PO) Take by mouth 2 (two) times daily.  . rosuvastatin (CRESTOR) 40 MG tablet rosuvastatin 40 mg tablet  . tamsulosin (FLOMAX) 0.4 MG CAPS capsule Take 1 capsule (0.4 mg total) by mouth daily after supper.  Marland Kitchen UNABLE TO FIND Med Name: xyzol  . [DISCONTINUED] finasteride (PROSCAR) 5 MG tablet Take 1 tablet (5 mg total) by mouth daily.  .  [DISCONTINUED] olmesartan (BENICAR) 40 MG tablet Take 1 tablet (40 mg total) by mouth daily.  . finasteride (PROSCAR) 5 MG tablet Take 1 tablet (5 mg total) by mouth daily.  Marland Kitchen olmesartan (BENICAR) 40 MG tablet Take 1 tablet (40 mg total) by mouth daily.   No facility-administered encounter medications on file  as of 07/19/2020.    Review of Systems:  Review of Systems  Constitutional: Negative for chills, fever and malaise/fatigue.  HENT: Negative for congestion and sore throat.   Eyes: Negative for blurred vision.  Respiratory: Negative for cough and shortness of breath.   Cardiovascular: Negative for chest pain, palpitations and leg swelling.  Gastrointestinal: Negative for abdominal pain and constipation.  Genitourinary: Positive for frequency. Negative for dysuria and urgency.  Musculoskeletal: Positive for back pain. Negative for falls and joint pain.  Skin: Negative for itching and rash.  Neurological: Negative for dizziness and loss of consciousness.       Unsteady gait, has cane  Endo/Heme/Allergies: Bruises/bleeds easily.  Psychiatric/Behavioral: Positive for memory loss. Negative for depression. The patient is not nervous/anxious and does not have insomnia.        MCI, but scores perfect on mmse     Health Maintenance  Topic Date Due  . PNA vac Low Risk Adult (1 of 2 - PCV13) Never done  . TETANUS/TDAP  11/03/2017  . COVID-19 Vaccine (3 - Booster for Pfizer series) 08/03/2020  . COLONOSCOPY (Pts 45-4yrs Insurance coverage will need to be confirmed)  09/18/2021  . INFLUENZA VACCINE  Completed  . Hepatitis C Screening  Completed    Physical Exam: Vitals:   07/19/20 1520  BP: 110/62  Pulse: 63  Temp: 97.8 F (36.6 C)  TempSrc: Temporal  SpO2: 95%  Weight: 225 lb (102.1 kg)  Height: 5\' 7"  (1.702 m)   Body mass index is 35.24 kg/m. Physical Exam Vitals reviewed.  Constitutional:      Appearance: Normal appearance. He is obese.  HENT:     Head: Normocephalic  and atraumatic.  Eyes:     Pupils: Pupils are equal, round, and reactive to light.  Cardiovascular:     Rate and Rhythm: Normal rate and regular rhythm.     Heart sounds: Murmur heard.    Pulmonary:     Effort: Pulmonary effort is normal.     Breath sounds: Normal breath sounds. No wheezing, rhonchi or rales.  Abdominal:     General: Bowel sounds are normal.  Musculoskeletal:        General: Normal range of motion.     Right lower leg: No edema.     Left lower leg: No edema.  Neurological:     General: No focal deficit present.     Mental Status: He is alert and oriented to person, place, and time.     Gait: Gait abnormal.     Comments: Uses cane  Psychiatric:        Mood and Affect: Mood normal.        Behavior: Behavior normal.     Labs reviewed: Basic Metabolic Panel: Recent Labs    09/25/19 1110 04/08/20 1506  NA  --  141  K  --  4.1  CL  --  104  CO2  --  27  GLUCOSE  --  104*  BUN  --  16  CREATININE  --  0.72  CALCIUM  --  9.1  TSH 1.860 1.54   Liver Function Tests: Recent Labs    09/25/19 1110 04/08/20 1506  AST  --  16  ALT  --  16  BILITOT  --  0.6  PROT 6.4 6.4   No results for input(s): LIPASE, AMYLASE in the last 8760 hours. No results for input(s): AMMONIA in the last 8760 hours. CBC: Recent Labs    04/08/20 1506  WBC 6.9  NEUTROABS 4,954  HGB 16.5  HCT 47.8  MCV 92.6  PLT 216   Lipid Panel: Recent Labs    04/08/20 1506  CHOL 161  HDL 49  LDLCALC 95  TRIG 81  CHOLHDL 3.3   Lab Results  Component Value Date   HGBA1C 5.5 09/25/2019    Assessment/Plan 1. BPH with obstruction/lower urinary tract symptoms -cont myrbetriq and proscar - finasteride (PROSCAR) 5 MG tablet; Take 1 tablet (5 mg total) by mouth daily.  Dispense: 90 tablet; Refill: 1  2. Chronic bilateral low back pain with left-sided sciatica - is for another surgery with Dr. Ronnald Ramp, just had cardiac clearance done - methocarbamol (ROBAXIN) 500 MG tablet; Take  1 tablet (500 mg total) by mouth at bedtime as needed for muscle spasms.  Dispense: 30 tablet; Refill: 0  3. Morbid obesity ([place]) -ongoing, he's not very active due to his back and BMI 35 -encouraged healthy low fat and low carb diet  4. COPD GOLD II -cont prn albuterol but breathing well  5. Moderate aortic stenosis - follows with cardiology, denies symptoms  6. Hyperlipidemia LDL goal <70 -was above goal last check Lab Results  Component Value Date   LDLCALC 95 04/08/2020  -continue crestor 40mg  daily -will need recheck when seen again  Labs/tests ordered:  None ordered today--will need f/u flp, cbc, bmp  Next appt:  4 mos med mgt and prevnar vaccine  Kynsley Whitehouse L. Asia Favata, D.O. [facility] [address], [place] [address] Cell Phone (Mon-Fri 8am-5pm):  [phone] On Call:  [phone] & follow prompts after 5pm & weekends Office Phone:  [phone] Office Fax:  [phone]

## 2020-07-26 ENCOUNTER — Encounter: Payer: Self-pay | Admitting: Internal Medicine

## 2020-08-02 ENCOUNTER — Ambulatory Visit: Payer: Medicare Other | Admitting: Internal Medicine

## 2020-08-05 ENCOUNTER — Ambulatory Visit: Payer: Medicare Other | Admitting: Podiatry

## 2020-08-05 ENCOUNTER — Telehealth: Payer: Self-pay | Admitting: *Deleted

## 2020-08-05 NOTE — Telephone Encounter (Signed)
Received fax fax from [facility] for Preoperative Medical Clearance.  Printed last OV note and placed in Dr. Cyndi Lennert folder to review. To be signed and faxed back to [phone]

## 2020-08-06 ENCOUNTER — Other Ambulatory Visit: Payer: Self-pay

## 2020-08-06 ENCOUNTER — Encounter: Payer: Self-pay | Admitting: Orthopedic Surgery

## 2020-08-06 ENCOUNTER — Ambulatory Visit (INDEPENDENT_AMBULATORY_CARE_PROVIDER_SITE_OTHER): Payer: Medicare Other | Admitting: Orthopedic Surgery

## 2020-08-06 VITALS — BP 90/62 | HR 51 | Temp 98.4°F | Resp 20 | Ht 67.0 in | Wt 220.8 lb

## 2020-08-06 DIAGNOSIS — T148XXA Other injury of unspecified body region, initial encounter: Secondary | ICD-10-CM

## 2020-08-06 DIAGNOSIS — L03116 Cellulitis of left lower limb: Secondary | ICD-10-CM

## 2020-08-06 MED ORDER — DOXYCYCLINE HYCLATE 100 MG PO TABS: 100.0000 mg | ORAL_TABLET | Freq: Two times a day (BID) | ORAL | 0 refills | Status: DC

## 2020-08-06 NOTE — Progress Notes (Signed)
Careteam: Patient Care Team: Gayland Curry, DO as PCP - General (Geriatric Medicine) Revankar, Reita Cliche, MD as PCP - Cardiology (Cardiology) Druscilla Brownie, MD as Consulting Physician (Dermatology) Garvin Fila, MD as Consulting Physician (Neurology)  Seen by: Windell Moulding, AGNP-C  PLACE OF SERVICE:  [place] Directive information Does Patient Have a Medical Advance Directive?: No, Does patient want to make changes to medical advance directive?: No - Patient declined  Allergies  Allergen Reactions  . Cefazolin Rash    The patient had surgery and was given cefazolin intraop. ~ 10 days later he developed a rash confirmed by biopsy to be consistent w/ drug eruption. We cannot know for sure, but this is the most likely agent.      Chief Complaint  Patient presents with  . Acute Visit    Knot on left leg      HPI: Patient is a 75 y.o. male seen today for acute visit for left leg abrasion and swelling.   About a week ago, he dropped a bottle of bleach, the cap broke, injuring his left shin. He developed a nickel-sized abrasion. Initially, he cleansed area with soap and water and left it open to air. The next day, he noticed swelling under abrasion. Yesterday, redness and tenderness developed on lower part of injury. Denies fever, pain or pus drainage. Seeking medical attention due to new symptoms of redness and tenderness.   Review of Systems:  Review of Systems  Constitutional: Negative for chills, fever and malaise/fatigue.  Respiratory: Negative for cough, shortness of breath and wheezing.   Cardiovascular: Negative for chest pain and leg swelling.  Skin:       Abrasion   Psychiatric/Behavioral: Negative for depression. The patient is not nervous/anxious.     Past Medical History:  Diagnosis Date  . 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 Adenomatous colon polyp   . Adrenal mass ([place])    2 benign appearing  left adrenal adenomas noted on 01/13/15 CT  . Allergy    seasonal  . Aortic stenosis    mild to moderate by echo and cath in 09/2016  . Arthritis   . BPH (benign prostatic hyperplasia)   . Carotid artery occlusion   . Cataract   . Chronic back pain   . COPD (chronic obstructive pulmonary disease) ([place])   . Coronary artery disease   . CVA (cerebral infarction)   . Degeneration of cervical intervertebral disc   . Diverticulosis   . Gout   . History of back surgery    Rods and Screws in back  . History of left knee replacement   . Hypertension   . Low back pain   . Meningioma ([place])   . Nocturia   . Peripheral vascular disease ([place])   . Ringing in ear    ([place])  . Sleep apnea    wears CPAP  . Stroke [facility]) 2013   tia    Past Surgical History:  Procedure Laterality Date  . APPENDECTOMY    . BACK SURGERY     Rods and Screws in Back  . BRAIN MENINGIOMA EXCISION     menigioma  . CARPAL TUNNEL RELEASE     left  . cataracts     both eyes  . EYE SURGERY Bilateral    Cataract  . INGUINAL HERNIA REPAIR    . JOINT REPLACEMENT Left 04-01-12   Knee  .  LEFT HEART CATH AND CORONARY ANGIOGRAPHY N/A 09/15/2016   Procedure: Left Heart Cath and Coronary Angiography;  Surgeon: Nelva Bush, MD;  Location: [place] CV LAB;  Service: Cardiovascular;  Laterality: N/A;  . MAXIMUM ACCESS (MAS)POSTERIOR LUMBAR INTERBODY FUSION (PLIF) 1 LEVEL N/A 07/07/2015   Procedure:  POSTERIOR LUMBAR INTERBODY FUSION (PLIF) Lumbar Four-Five with Pedicle Screw Fixation Lumbar Two-Five;Laminectomy Lumbar Two-Five;  Surgeon: Eustace Moore, MD;  Location: [place] NEURO ORS;  Service: Neurosurgery;  Laterality: N/A;   POSTERIOR LUMBAR INTERBODY FUSION (PLIF) Lumbar Four-Five with Pedicle Screw Fixation Lumbar Two-Five;Laminectomy Lumbar Two-Five  . PROSTATE SURGERY    . RIGHT/LEFT HEART CATH AND CORONARY ANGIOGRAPHY N/A 01/24/2018   Procedure: RIGHT/LEFT HEART CATH AND CORONARY ANGIOGRAPHY;  Surgeon: Nelva Bush, MD;  Location: [place] CV LAB;  Service: Cardiovascular;  Laterality: N/A;  . TONSILLECTOMY    . TOTAL KNEE ARTHROPLASTY  04/01/2012   Procedure: TOTAL KNEE ARTHROPLASTY;  Surgeon: Gearlean Alf, MD;  Location: [facility];  Service: Orthopedics;  Laterality: Left;  . TRANSURETHRAL RESECTION OF PROSTATE    . UMBILICAL HERNIA REPAIR  2011   Social History:   reports that he quit smoking about 12 months ago. His smoking use included cigarettes. He has a 30.00 pack-year smoking history. He has never used smokeless tobacco. He reports current alcohol use. He reports that he does not use drugs.  Family History  Problem Relation Age of Onset  . Heart disease Mother        Onset ~47 y/o  . Hypertension Mother        Deceased from old age at 50  . Hyperlipidemia Mother   . Arthritis Mother   . Diabetes Father        Deceased from old age at 22  . Heart attack Father   . Heart disease Father        CABG at age 23  . Dementia Father 70  . Arthritis Father   . Prostate cancer Maternal Grandfather   . Colon cancer Neg Hx   . Esophageal cancer Neg Hx   . Rectal cancer Neg Hx   . Stomach cancer Neg Hx     Medications: Patient's Medications  New Prescriptions   No medications on file  Previous Medications   ACETAMINOPHEN (TYLENOL) 500 MG TABLET    Take 500 mg by mouth at bedtime.    ALBUTEROL (VENTOLIN HFA) 108 (90 BASE) MCG/ACT INHALER    Inhale 2 puffs into the lungs every 6 (six) hours as needed for wheezing or shortness of breath.   AMLODIPINE (NORVASC) 5 MG TABLET       AZO-CRANBERRY PO    Take 2 tablets by mouth at bedtime.   B COMPLEX VITAMINS CAPSULE    Take 1 capsule by mouth daily.   CHOLECALCIFEROL (D-3-5) 125 MCG (5000 UT) CAPSULE    Take 5,000 Units by mouth daily.   CLOPIDOGREL (PLAVIX) 75 MG TABLET    Take 1 tablet (75 mg total) by mouth daily.   CYANOCOBALAMIN 2000 MCG TABLET    Take 0.5 tablets (1,000 mcg total) by mouth daily.   FINASTERIDE (PROSCAR) 5 MG  TABLET    Take 1 tablet (5 mg total) by mouth daily.   FLUTICASONE (FLONASE) 50 MCG/ACT NASAL SPRAY    Place 1 spray into both nostrils daily.   GABAPENTIN (NEURONTIN) 300 MG CAPSULE    Take 1 capsule (300 mg total) by mouth at bedtime.   LEVOCETIRIZINE DIHYDROCHLORIDE (XYZAL ALLERGY 24HR  PO)    Take 2.5 mg by mouth at bedtime. For Allergies   LOPERAMIDE (IMODIUM) 2 MG CAPSULE    Take 2 mg by mouth as needed.    METHOCARBAMOL (ROBAXIN) 500 MG TABLET    Take 1 tablet (500 mg total) by mouth at bedtime as needed for muscle spasms.   MIRABEGRON ER (MYRBETRIQ) 25 MG TB24 TABLET    Take 25 mg by mouth daily.   OLMESARTAN (BENICAR) 40 MG TABLET    Take 1 tablet (40 mg total) by mouth daily.   ROSUVASTATIN (CRESTOR) 40 MG TABLET    Take 40 mg by mouth daily.   TAMSULOSIN (FLOMAX) 0.4 MG CAPS CAPSULE    Take 1 capsule (0.4 mg total) by mouth daily after supper.  Modified Medications   No medications on file  Discontinued Medications   PHENAZOPYRIDINE HCL (AZO TABS PO)    Take by mouth.   PUMPKIN SEED (AZO MEN BLADDER CONTROL PO)    Take by mouth 2 (two) times daily.   UNABLE TO FIND    Med Name: xyzol    Physical Exam:  Vitals:   08/06/20 1450  Pulse: (!) 51  Resp: 20  Temp: 98.4 F (36.9 C)  TempSrc: Temporal  SpO2: 93%  Weight: 220 lb 12.8 oz (100.2 kg)  Height: 5\' 7"  (1.702 m)   Body mass index is 34.58 kg/m. Wt Readings from Last 3 Encounters:  08/06/20 220 lb 12.8 oz (100.2 kg)  07/19/20 225 lb (102.1 kg)  06/03/20 225 lb (102.1 kg)    Physical Exam Vitals reviewed.  Constitutional:      General: He is not in acute distress. HENT:     Head: Normocephalic.  Cardiovascular:     Rate and Rhythm: Normal rate and regular rhythm.     Pulses: Normal pulses.     Heart sounds: Murmur heard.    Pulmonary:     Effort: Pulmonary effort is normal. No respiratory distress.     Breath sounds: Normal breath sounds. No wheezing.  Skin:    General: Skin is warm and dry.      Capillary Refill: Capillary refill takes less than 2 seconds.     Findings: Lesion present.     Comments: Nickel-sized abrasion to left anterior leg. Scab developed, black. Erythema present near lower half of scab. Moderate swelling under wound suggestive of hematoma. No drainage present. Tenderness noted with touch, patient tolerated well.   Neurological:     General: No focal deficit present.     Mental Status: He is alert and oriented to person, place, and time.     Motor: Weakness present.     Gait: Gait abnormal.     Comments: cane  Psychiatric:        Mood and Affect: Mood normal.        Behavior: Behavior normal.     Labs reviewed: Basic Metabolic Panel: Recent Labs    09/25/19 1110 04/08/20 1506  NA  --  141  K  --  4.1  CL  --  104  CO2  --  27  GLUCOSE  --  104*  BUN  --  16  CREATININE  --  0.72  CALCIUM  --  9.1  TSH 1.860 1.54   Liver Function Tests: Recent Labs    09/25/19 1110 04/08/20 1506  AST  --  16  ALT  --  16  BILITOT  --  0.6  PROT 6.4 6.4   No results  for input(s): LIPASE, AMYLASE in the last 8760 hours. No results for input(s): AMMONIA in the last 8760 hours. CBC: Recent Labs    04/08/20 1506  WBC 6.9  NEUTROABS 4,954  HGB 16.5  HCT 47.8  MCV 92.6  PLT 216   Lipid Panel: Recent Labs    04/08/20 1506  CHOL 161  HDL 49  LDLCALC 95  TRIG 81  CHOLHDL 3.3   TSH: Recent Labs    09/25/19 1110 04/08/20 1506  TSH 1.860 1.54   A1C: Lab Results  Component Value Date   HGBA1C 5.5 09/25/2019     Assessment/Plan 1. Cellulitis of left lower extremity - erythema and tenderness suggestive of cellulitis - advised to keep area clean and scab intact - advised to complete all antibiotics - contact PCP if fever, pus drainage or increased redness occur - doxycycline (VIBRA-TABS) 100 MG tablet; Take 1 tablet (100 mg total) by mouth 2 (two) times daily.  Dispense: 20 tablet; Refill: 0  2. Hematoma - effusion under wound suggestive  of hematoma - do not recommend heat until infection has resolved - I have educated patient it may take awhile for swelling to subside - contact PCP if swelling increases  I provided 20 minutes of face-to-face time during this encounter.    Next appt: 11/19/2020 Windell Moulding, [facility] [phone]

## 2020-08-06 NOTE — Patient Instructions (Signed)

## 2020-08-11 ENCOUNTER — Other Ambulatory Visit: Payer: Self-pay | Admitting: Neurological Surgery

## 2020-08-11 DIAGNOSIS — M5126 Other intervertebral disc displacement, lumbar region: Secondary | ICD-10-CM

## 2020-08-13 ENCOUNTER — Encounter (HOSPITAL_COMMUNITY): Payer: Self-pay

## 2020-08-13 ENCOUNTER — Emergency Department (HOSPITAL_COMMUNITY)
Admission: EM | Admit: 2020-08-13 | Discharge: 2020-08-13 | Disposition: A | Discharge status: Home / Self Care | Payer: Medicare Other | Attending: Emergency Medicine | Admitting: Emergency Medicine

## 2020-08-13 ENCOUNTER — Other Ambulatory Visit: Payer: Self-pay

## 2020-08-13 ENCOUNTER — Encounter: Payer: Self-pay | Admitting: Orthopedic Surgery

## 2020-08-13 ENCOUNTER — Ambulatory Visit (INDEPENDENT_AMBULATORY_CARE_PROVIDER_SITE_OTHER): Payer: Medicare Other | Admitting: Orthopedic Surgery

## 2020-08-13 VITALS — BP 100/60 | HR 61 | Temp 98.8°F | Resp 20 | Ht 67.0 in | Wt 222.2 lb

## 2020-08-13 DIAGNOSIS — I129 Hypertensive chronic kidney disease with stage 1 through stage 4 chronic kidney disease, or unspecified chronic kidney disease: Secondary | ICD-10-CM | POA: Diagnosis not present

## 2020-08-13 DIAGNOSIS — J449 Chronic obstructive pulmonary disease, unspecified: Secondary | ICD-10-CM | POA: Insufficient documentation

## 2020-08-13 DIAGNOSIS — Z96652 Presence of left artificial knee joint: Secondary | ICD-10-CM | POA: Diagnosis not present

## 2020-08-13 DIAGNOSIS — I2510 Atherosclerotic heart disease of native coronary artery without angina pectoris: Secondary | ICD-10-CM | POA: Insufficient documentation

## 2020-08-13 DIAGNOSIS — Z79899 Other long term (current) drug therapy: Secondary | ICD-10-CM | POA: Insufficient documentation

## 2020-08-13 DIAGNOSIS — M79662 Pain in left lower leg: Secondary | ICD-10-CM | POA: Diagnosis present

## 2020-08-13 DIAGNOSIS — N189 Chronic kidney disease, unspecified: Secondary | ICD-10-CM | POA: Diagnosis not present

## 2020-08-13 DIAGNOSIS — Z7952 Long term (current) use of systemic steroids: Secondary | ICD-10-CM | POA: Diagnosis not present

## 2020-08-13 DIAGNOSIS — L03116 Cellulitis of left lower limb: Secondary | ICD-10-CM | POA: Insufficient documentation

## 2020-08-13 DIAGNOSIS — Z87891 Personal history of nicotine dependence: Secondary | ICD-10-CM | POA: Insufficient documentation

## 2020-08-13 LAB — COMPREHENSIVE METABOLIC PANEL
ALT: 17 U/L (ref 0–44)
AST: 16 U/L (ref 15–41)
Albumin: 3.8 g/dL (ref 3.5–5.0)
Alkaline Phosphatase: 66 U/L (ref 38–126)
Anion gap: 5 (ref 5–15)
BUN: 18 mg/dL (ref 8–23)
CO2: 28 mmol/L (ref 22–32)
Calcium: 8.9 mg/dL (ref 8.9–10.3)
Chloride: 107 mmol/L (ref 98–111)
Creatinine, Ser: 0.69 mg/dL (ref 0.61–1.24)
GFR, Estimated: >60 mL/min (ref >60)
Glucose, Bld: 115 mg/dL — ABNORMAL HIGH (ref 70–99)
Potassium: 4.4 mmol/L (ref 3.5–5.1)
Sodium: 140 mmol/L (ref 135–145)
Total Bilirubin: 1.2 mg/dL (ref 0.3–1.2)
Total Protein: 6.4 g/dL — ABNORMAL LOW (ref 6.5–8.1)

## 2020-08-13 LAB — CBC WITH DIFFERENTIAL/PLATELET
Abs Immature Granulocytes: 0.04 10*3/uL (ref 0.00–0.07)
Basophils Absolute: 0 10*3/uL (ref 0.0–0.1)
Basophils Relative: 0 %
Eosinophils Absolute: 0.1 10*3/uL (ref 0.0–0.5)
Eosinophils Relative: 1 %
HCT: 43.9 % (ref 39.0–52.0)
Hemoglobin: 14.7 g/dL (ref 13.0–17.0)
Immature Granulocytes: 1 %
Lymphocytes Relative: 12 %
Lymphs Abs: 1.1 10*3/uL (ref 0.7–4.0)
MCH: 32 pg (ref 26.0–34.0)
MCHC: 33.5 g/dL (ref 30.0–36.0)
MCV: 95.6 fL (ref 80.0–100.0)
Monocytes Absolute: 0.6 10*3/uL (ref 0.1–1.0)
Monocytes Relative: 7 %
Neutro Abs: 6.9 10*3/uL (ref 1.7–7.7)
Neutrophils Relative %: 79 %
Platelets: 195 10*3/uL (ref 150–400)
RBC: 4.59 MIL/uL (ref 4.22–5.81)
RDW: 13.2 % (ref 11.5–15.5)
WBC: 8.7 10*3/uL (ref 4.0–10.5)
nRBC: 0 % (ref 0.0–0.2)

## 2020-08-13 MED ORDER — DOXYCYCLINE HYCLATE 100 MG PO CAPS: 100.0000 mg | ORAL_CAPSULE | Freq: Two times a day (BID) | ORAL | 0 refills | Status: AC

## 2020-08-13 MED ORDER — CIPROFLOXACIN HCL 500 MG PO TABS: 500.0000 mg | ORAL_TABLET | Freq: Two times a day (BID) | ORAL | 0 refills | Status: DC

## 2020-08-13 MED ORDER — CIPROFLOXACIN HCL 500 MG PO TABS
500.0000 mg | ORAL_TABLET | Freq: Once | ORAL | Status: AC
  Administered 2020-08-13: 500 mg via ORAL
  Filled 2020-08-13: qty 1

## 2020-08-13 NOTE — Patient Instructions (Addendum)
Please report to ED for further evaluation  Cellulitis, Adult  Cellulitis is a skin infection. The infected area is often warm, red, swollen, and sore. It occurs most often in the arms and lower legs. It is very important to get treated for this condition. What are the causes? This condition is caused by bacteria. The bacteria enter through a break in the skin, such as a cut, burn, insect bite, open sore, or crack. What increases the risk? This condition is more likely to occur in people who:  Have a weak body defense system (immune system).  Have open cuts, burns, bites, or scrapes on the skin.  Are older than 75 years of age.  Have a blood sugar problem (diabetes).  Have a long-lasting (chronic) liver disease (cirrhosis) or kidney disease.  Are very overweight (obese).  Have a skin problem, such as: ? Itchy rash (eczema). ? Slow movement of blood in the veins (venous stasis). ? Fluid buildup below the skin (edema).  Have been treated with high-energy rays (radiation).  Use IV drugs. What are the signs or symptoms? Symptoms of this condition include:  Skin that is: ? Red. ? Streaking. ? Spotting. ? Swollen. ? Sore or painful when you touch it. ? Warm.  A fever.  Chills.  Blisters. How is this diagnosed? This condition is diagnosed based on:  Medical history.  Physical exam.  Blood tests.  Imaging tests. How is this treated? Treatment for this condition may include:  Medicines to treat infections or allergies.  Home care, such as: ? Rest. ? Placing cold or warm cloths (compresses) on the skin.  Hospital care, if the condition is very bad. Follow these instructions at home: Medicines  Take over-the-counter and prescription medicines only as told by your doctor.  If you were prescribed an antibiotic medicine, take it as told by your doctor. Do not stop taking it even if you start to feel better. General instructions  Drink enough fluid to keep  your pee (urine) pale yellow.  Do not touch or rub the infected area.  Raise (elevate) the infected area above the level of your heart while you are sitting or lying down.  Place cold or warm cloths on the area as told by your doctor.  Keep all follow-up visits as told by your doctor. This is important.   Contact a doctor if:  You have a fever.  You do not start to get better after 1-2 days of treatment.  Your bone or joint under the infected area starts to hurt after the skin has healed.  Your infection comes back. This can happen in the same area or another area.  You have a swollen bump in the area.  You have new symptoms.  You feel ill and have muscle aches and pains. Get help right away if:  Your symptoms get worse.  You feel very sleepy.  You throw up (vomit) or have watery poop (diarrhea) for a long time.  You see red streaks coming from the area.  Your red area gets larger.  Your red area turns dark in color. These symptoms may represent a serious problem that is an emergency. Do not wait to see if the symptoms will go away. Get medical help right away. Call your local emergency services (911 in the [place] Do not drive yourself to the hospital. Summary  Cellulitis is a skin infection. The area is often warm, red, swollen, and sore.  This condition is treated with medicines, rest, and  cold and warm cloths.  Take all medicines only as told by your doctor.  Tell your doctor if symptoms do not start to get better after 1-2 days of treatment. This information is not intended to replace advice given to you by your health care provider. Make sure you discuss any questions you have with your health care provider. Document Revised: 10/11/2017 Document Reviewed: 10/11/2017 Elsevier Patient Education  [place].

## 2020-08-13 NOTE — ED Provider Notes (Signed)
[facility] DEPT Provider Note   CSN: [id number] Arrival date & time: 08/13/20  1310     History Chief Complaint  Patient presents with  . Leg Injury    Kyle Lewis is a 75 y.o. male history of AAA, BPH, COPD who presents for evaluation of pain, redness noted to his left lower extremity that has been ongoing for about a week and a half.  He states that initially, he had hit his shin on a bottle of Clorox.  He states that Had broken off and caused a small abrasion to his shin.  He started noticing a few days later, the area was getting warm, hot to touch.  He saw his primary care doctor who started him on doxycycline.  He has been taking doxycycline for about 5 days now.  He had a follow-up appointment with her today.  He states that he feels like the central wound itself is getting better but he started noticing that the redness started spreading around his leg.  He has not had any fevers.  He denies any chest pain, difficulty breathing.  He has been taking the antibiotics and has not missed any doses.  No history of diabetes.  No history of DVT or PE.  The history is provided by the patient.       Past Medical History:  Diagnosis Date  . 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 Adenomatous colon polyp   . Adrenal mass ([place])    2 benign appearing left adrenal adenomas noted on 01/13/15 CT  . Allergy    seasonal  . Aortic stenosis    mild to moderate by echo and cath in 09/2016  . Arthritis   . BPH (benign prostatic hyperplasia)   . Carotid artery occlusion   . Cataract   . Chronic back pain   . COPD (chronic obstructive pulmonary disease) ([place])   . Coronary artery disease   . CVA (cerebral infarction)   . Degeneration of cervical intervertebral disc   . Diverticulosis   . Gout   . History of back surgery    Rods and Screws in back  . History of left knee replacement   . Hypertension   . Low back  pain   . Meningioma ([place])   . Nocturia   . Peripheral vascular disease ([place])   . Ringing in ear    ([place])  . Sleep apnea    wears CPAP  . Stroke [facility]) 2013   tia     Patient Active Problem List   Diagnosis Date Noted  . Anxiety 04/08/2020  . COPD GOLD II 04/07/2019  . Exertional dyspnea 02/24/2019  . Preop cardiovascular exam 10/24/2018  . Cigarette smoker 09/05/2018  . CAD (coronary artery disease) 02/01/2018  . Moderate aortic stenosis 02/01/2018  . Status post AAA (abdominal aortic aneurysm) repair 02/01/2018  . Pre-procedure lab exam 01/19/2018  . Hyperlipidemia LDL goal <70 01/19/2018  . History of craniotomy 01/11/2018  . Blister of multiple sites of lower extremity 01/10/2018  . Leukocytosis 01/10/2018  . Acute kidney injury superimposed on chronic kidney disease ([place]) 01/10/2018  . Heat stroke 01/09/2018  . Hyperthermia 01/09/2018  . Other specified postprocedural states 09/25/2017  . Osteoarthritis of right glenohumeral joint 08/14/2017  . Lower urinary tract symptoms (LUTS) 08/06/2017  . S/P lumbar spinal fusion 07/07/2015  . Iliac artery aneurysm, left ([place]) 07/01/2015  . Peripheral vascular disease (  [place]) 07/01/2015  . Ruptured abdominal aortic aneurysm (AAA) ([place]) 07/01/2015  . Hematuria 01/07/2015  . History of right MCA stroke 07/21/2014  . OAB (overactive bladder) 01/19/2014  . Stroke ([place]) 01/01/2014  . Acute renal failure ([place]) 11/08/2013  . Dehydration 11/08/2013  . Electrolyte and fluid disorder 11/08/2013  . Exogenous obesity 11/08/2013  . 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  . Ileus ([place]) 11/08/2013  . Microscopic hematuria 11/08/2013  . Nausea with vomiting 11/08/2013  . Tobacco use 11/08/2013  . Nocturia 11/08/2013  . Ventral hernia 11/08/2013  . Carotid arterial disease ([place]) 05/23/2012  . Essential hypertension   . OSA (obstructive sleep apnea)   . Chronic back pain   . BPH with obstruction/lower  urinary tract symptoms   . CVA (cerebral infarction)   . Postop Acute blood loss anemia 04/03/2012  . Postop Hypokalemia 04/03/2012  . OA (osteoarthritis) of knee 04/01/2012  . DEPRESSION 03/17/2010  . DIVERTICULOSIS, COLON 03/17/2010  . FATIGUE 03/17/2010  . DYSPHAGIA UNSPECIFIED 03/17/2010  . COLONIC POLYPS, ADENOMATOUS, HX OF 03/17/2010    Past Surgical History:  Procedure Laterality Date  . APPENDECTOMY    . BACK SURGERY     Rods and Screws in Back  . BRAIN MENINGIOMA EXCISION     menigioma  . CARPAL TUNNEL RELEASE     left  . cataracts     both eyes  . EYE SURGERY Bilateral    Cataract  . INGUINAL HERNIA REPAIR    . JOINT REPLACEMENT Left 04-01-12   Knee  . LEFT HEART CATH AND CORONARY ANGIOGRAPHY N/A 09/15/2016   Procedure: Left Heart Cath and Coronary Angiography;  Surgeon: Nelva Bush, MD;  Location: [facility] CV LAB;  Service: Cardiovascular;  Laterality: N/A;  . MAXIMUM ACCESS (MAS)POSTERIOR LUMBAR INTERBODY FUSION (PLIF) 1 LEVEL N/A 07/07/2015   Procedure:  POSTERIOR LUMBAR INTERBODY FUSION (PLIF) Lumbar Four-Five with Pedicle Screw Fixation Lumbar Two-Five;Laminectomy Lumbar Two-Five;  Surgeon: Eustace Moore, MD;  Location: [facility]UR[facility] ORS;  Service: Neurosurgery;  Laterality: N/A;   POSTERIOR LUMBAR INTERBODY FUSION (PLIF) Lumbar Four-Five with Pedicle Screw Fixation Lumbar Two-Five;Laminectomy Lumbar Two-Five  . PROSTATE SURGERY    . RIGHT/LEFT HEART CATH AND CORONARY ANGIOGRAPHY N/A 01/24/2018   Procedure: RIGHT/LEFT HEART CATH AND CORONARY ANGIOGRAPHY;  Surgeon: Nelva Bush, MD;  Location: [facility] CV LAB;  Service: Cardiovascular;  Laterality: N/A;  . TONSILLECTOMY    . TOTAL KNEE ARTHROPLASTY  04/01/2012   Procedure: TOTAL KNEE ARTHROPLASTY;  Surgeon: Gearlean Alf, MD;  Location: [facility] ORS;  Service: Orthopedics;  Laterality: Left;  . TRANSURETHRAL RESECTION OF PROSTATE    . UMBILICAL HERNIA REPAIR  2011       Family History  Problem Relation Age  of Onset  . Heart disease Mother        Onset ~36 y/o  . Hypertension Mother        Deceased from old age at 32  . Hyperlipidemia Mother   . Arthritis Mother   . Diabetes Father        Deceased from old age at 85  . Heart attack Father   . Heart disease Father        CABG at age 104  . Dementia Father 53  . Arthritis Father   . Prostate cancer Maternal Grandfather   . Colon cancer Neg Hx   . Esophageal cancer Neg Hx   . Rectal cancer Neg Hx   . Stomach cancer Neg Hx  Social History   Tobacco Use  . Smoking status: Former Smoker    Packs/day: 0.75    Years: 40.00    Pack years: 30.00    Types: Cigarettes    Quit date: 07/20/2019    Years since quitting: 1.0  . Smokeless tobacco: Never Used  . Tobacco comment: 1/2 pack a day if that   Vaping Use  . Vaping Use: Never used  Substance Use Topics  . Alcohol use: Yes    Comment: rare  . Drug use: No    Home Medications Prior to Admission medications   Medication Sig Start Date End Date Taking? Authorizing Provider  ciprofloxacin (CIPRO) 500 MG tablet Take 1 tablet (500 mg total) by mouth every 12 (twelve) hours. 08/13/20  Yes Volanda Napoleon, PA-C  doxycycline (VIBRAMYCIN) 100 MG capsule Take 1 capsule (100 mg total) by mouth 2 (two) times daily for 5 days. 08/13/20 08/18/20 Yes Volanda Napoleon, PA-C  acetaminophen (TYLENOL) 500 MG tablet Take 500 mg by mouth at bedtime.     [provider]  albuterol (VENTOLIN HFA) 108 (90 Base) MCG/ACT inhaler Inhale 2 puffs into the lungs every 6 (six) hours as needed for wheezing or shortness of breath. 02/24/19   Martyn Ehrich, NP  amLODipine (NORVASC) 5 MG tablet  11/28/18   [provider]  AZO-CRANBERRY PO Take 2 tablets by mouth at bedtime.    [provider]  b complex vitamins capsule Take 1 capsule by mouth daily.    [provider]  Cholecalciferol (D-3-5) 125 MCG (5000 UT) capsule Take 5,000 Units by mouth daily.    [provider]  clopidogrel (PLAVIX) 75 MG tablet Take 1 tablet (75 mg total) by mouth daily. 06/09/20   Reed, Tiffany L, DO  cyanocobalamin 2000 MCG tablet Take 0.5 tablets (1,000 mcg total) by mouth daily. 06/03/20   Garvin Fila, MD  finasteride (PROSCAR) 5 MG tablet Take 1 tablet (5 mg total) by mouth daily. 07/19/20   Reed, Tiffany L, DO  fluticasone (FLONASE) 50 MCG/ACT nasal spray Place 1 spray into both nostrils daily. 09/02/19   [provider]  gabapentin (NEURONTIN) 300 MG capsule Take 1 capsule (300 mg total) by mouth at bedtime. 06/09/20   Reed, Tiffany L, DO  Levocetirizine Dihydrochloride (XYZAL ALLERGY 24HR PO) Take 2.5 mg by mouth at bedtime. For Allergies    [provider]  loperamide (IMODIUM) 2 MG capsule Take 2 mg by mouth as needed.     [provider]  methocarbamol (ROBAXIN) 500 MG tablet Take 1 tablet (500 mg total) by mouth at bedtime as needed for muscle spasms. 07/19/20   Reed, Tiffany L, DO  mirabegron ER (MYRBETRIQ) 25 MG TB24 tablet Take 25 mg by mouth daily.    [provider]  olmesartan (BENICAR) 40 MG tablet Take 1 tablet (40 mg total) by mouth daily. 07/19/20   Reed, Tiffany L, DO  rosuvastatin (CRESTOR) 40 MG tablet Take 40 mg by mouth daily.    [provider]  tamsulosin (FLOMAX) 0.4 MG CAPS capsule Take 1 capsule (0.4 mg total) by mouth daily after supper. 06/09/20   Reed, Tiffany L, DO    Allergies    Cefazolin  Review of Systems   Review of Systems  Constitutional: Negative for fever.  Respiratory: Negative for cough and shortness of breath.   Cardiovascular: Negative for chest pain.  Gastrointestinal: Negative for abdominal pain, nausea and vomiting.  Genitourinary: Negative for dysuria.  Skin: Positive for color change and wound.  Neurological: Negative for weakness and numbness.  All other systems reviewed and are negative.   Physical Exam Updated Vital Signs BP (!) 174/81   Pulse (!) 53   Temp 98.4  F (36.9 C)   Resp 19   Ht 5\' 7"  (1.702 m)   Wt 100.8 kg   SpO2 95%   BMI 34.80 kg/m   Physical Exam Vitals and nursing note reviewed.  Constitutional:      Appearance: Normal appearance. He is well-developed.  HENT:     Head: Normocephalic and atraumatic.  Eyes:     General: Lids are normal.     Conjunctiva/sclera: Conjunctivae normal.     Pupils: Pupils are equal, round, and reactive to light.  Cardiovascular:     Rate and Rhythm: Normal rate and regular rhythm.     Pulses: Normal pulses.          Dorsalis pedis pulses are 2+ on the right side and 2+ on the left side.     Heart sounds: Normal heart sounds. No murmur heard. No friction rub. No gallop.   Pulmonary:     Effort: Pulmonary effort is normal.     Breath sounds: Normal breath sounds.     Comments: Lungs clear to auscultation bilaterally.  Symmetric chest rise.  No wheezing, rales, rhonchi. Abdominal:     Palpations: Abdomen is soft. Abdomen is not rigid.     Tenderness: There is no abdominal tenderness. There is no guarding.  Musculoskeletal:        General: Normal range of motion.     Cervical back: Full passive range of motion without pain.  Skin:    General: Skin is warm and dry.     Capillary Refill: Capillary refill takes less than 2 seconds.          Comments: Nickel sized wound noted to the anterior aspect of the left lower leg.  No drainage noted.  There is surrounding warmth, erythema noted to the anterior aspect of the left lower extremity. Good distal cap refill.  LLE is not dusky in appearance or cool to touch.  Neurological:     Mental Status: He is alert and oriented to person, place, and time.  Psychiatric:        Speech: Speech normal.        ED Results / Procedures / Treatments   Labs (all labs ordered are listed, but only abnormal results are displayed) Labs Reviewed  COMPREHENSIVE METABOLIC PANEL - Abnormal; Notable for the following components:      Result Value   Glucose, Bld  115 (*)    Total Protein 6.4 (*)    All other components within normal limits  CBC WITH DIFFERENTIAL/PLATELET    EKG None  Radiology No results found.  Procedures Procedures   Medications Ordered in ED Medications  ciprofloxacin (CIPRO) tablet 500 mg (500 mg Oral Given 08/13/20 1717)    ED Course  I have reviewed the triage vital signs and the nursing notes.  Pertinent labs & imaging results that were available during my care of the patient were reviewed by me and considered in my medical decision making (see chart for details).    MDM Rules/Calculators/A&P                          75 year old male who presents for evaluation of redness, wound noted to the left lower leg.  He  states that about a week and a half ago, he hit it on a cap of a bleach bottle.  Saw PCP and started him on doxycycline which he states he has been taking with no improvement.  Was sent here for further evaluation.  On initial arrival, he is afebrile, nontoxic-appearing.  Vital signs are stable.  On exam, he has a nickel sized wound noted to the anterior aspect of the shin.  No active drainage.  There is surrounding warmth, erythema. Concern for infectious process.  Do not suspect this is a DVT given that it is not circumferential and it occurred after trauma. We will plan to check labs.  CBC shows no leukocytosis. CMP shows normal BUN/Cr.  Discussed results with patient.  At this time, patient does not have fevers, he is hemodynamically stable with reassuring work-up.  At this time, he does not meet criteria for admission and in his work-up is not concerning for systemic infection.  We will plan to add an antibiotic to his doxycycline regimen.  He has a history of allergy to cefazolin.  We will hold off on any cephalosporins.  We will add Cipro.  The area was marked.  I discussed patient that if he has no improvement 40 hours to return as he would have failed outpatient oral antibiotic therapy.  Additionally, he  is starting spreading outside the line in the time before that, to return to the emergency department.  At this time, he has no signs of systemic illness, he is well-appearing. At this time, patient exhibits no emergent life-threatening condition that require further evaluation in ED. Patient had ample opportunity for questions and discussion. All patient's questions were answered with full understanding. Strict return precautions discussed. Patient expresses understanding and agreement to plan.   Portions of this note were generated with [employer]. Dictation errors may occur despite best attempts at proofreading.   Final Clinical Impression(s) / ED Diagnoses Final diagnoses:  Cellulitis of left lower extremity    Rx / DC Orders ED Discharge Orders         Ordered    doxycycline (VIBRAMYCIN) 100 MG capsule  2 times daily        08/13/20 1709    ciprofloxacin (CIPRO) 500 MG tablet  Every 12 hours        08/13/20 1709           Volanda Napoleon, PA-C 08/13/20 2041    Varney Biles, MD 08/15/20 1235

## 2020-08-13 NOTE — Progress Notes (Signed)
Careteam: Patient Care Team: Gayland Curry, DO as PCP - General (Geriatric Medicine) Revankar, Reita Cliche, MD as PCP - Cardiology (Cardiology) Druscilla Brownie, MD as Consulting Physician (Dermatology) Garvin Fila, MD as Consulting Physician (Neurology)  Seen by: Windell Moulding, AGNP-C  PLACE OF SERVICE:  [place] Directive information Does Patient Have a Medical Advance Directive?: No, Does patient want to make changes to medical advance directive?: No - Patient declined  Allergies  Allergen Reactions  . Cefazolin Rash    The patient had surgery and was given cefazolin intraop. ~ 10 days later he developed a rash confirmed by biopsy to be consistent w/ drug eruption. We cannot know for sure, but this is the most likely agent.      Chief Complaint  Patient presents with  . Acute Visit    Knot on leg hot to touch and sore to touch, red, and slite bleeding      HPI: Patient is a 75 y.o. male seen today for unresolved cellulitis of left shin.   Over a week ago, he dropped a bottle of bleach and the cap caused a abrasion to left shin. He initially kept area clean with soap and water, but a few days later, swelling and redness began. He was seen last Friday and doxycycline started. Today, he reports the abrasion to his skin is painful, hot to touch and redness has spread. Denies fever. He is still taking doxycycline and keeping area clean. Seeking advise for unresolved cellulitis and worsening symptoms.   Review of Systems:  Review of Systems  Constitutional: Negative for chills and fever.  Respiratory: Negative for cough, shortness of breath and wheezing.   Cardiovascular: Negative for chest pain and leg swelling.  Skin:       Abrasion to left shin  Psychiatric/Behavioral: Negative for depression. The patient is not nervous/anxious.     Past Medical History:  Diagnosis Date  . 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  Adenomatous colon polyp   . Adrenal mass ([place])    2 benign appearing left adrenal adenomas noted on 01/13/15 CT  . Allergy    seasonal  . Aortic stenosis    mild to moderate by echo and cath in 09/2016  . Arthritis   . BPH (benign prostatic hyperplasia)   . Carotid artery occlusion   . Cataract   . Chronic back pain   . COPD (chronic obstructive pulmonary disease) ([place])   . Coronary artery disease   . CVA (cerebral infarction)   . Degeneration of cervical intervertebral disc   . Diverticulosis   . Gout   . History of back surgery    Rods and Screws in back  . History of left knee replacement   . Hypertension   . Low back pain   . Meningioma ([place])   . Nocturia   . Peripheral vascular disease ([place])   . Ringing in ear    (SLIGHT)  . Sleep apnea    wears CPAP  . Stroke [facility]) 2013   tia    Past Surgical History:  Procedure Laterality Date  . APPENDECTOMY    . BACK SURGERY     Rods and Screws in Back  . BRAIN MENINGIOMA EXCISION     menigioma  . CARPAL TUNNEL RELEASE     left  . cataracts     both eyes  . EYE SURGERY Bilateral  Cataract  . INGUINAL HERNIA REPAIR    . JOINT REPLACEMENT Left 04-01-12   Knee  . LEFT HEART CATH AND CORONARY ANGIOGRAPHY N/A 09/15/2016   Procedure: Left Heart Cath and Coronary Angiography;  Surgeon: Nelva Bush, MD;  Location: [place] CV LAB;  Service: Cardiovascular;  Laterality: N/A;  . MAXIMUM ACCESS (MAS)POSTERIOR LUMBAR INTERBODY FUSION (PLIF) 1 LEVEL N/A 07/07/2015   Procedure:  POSTERIOR LUMBAR INTERBODY FUSION (PLIF) Lumbar Four-Five with Pedicle Screw Fixation Lumbar Two-Five;Laminectomy Lumbar Two-Five;  Surgeon: Eustace Moore, MD;  Location: [place] NEURO ORS;  Service: Neurosurgery;  Laterality: N/A;   POSTERIOR LUMBAR INTERBODY FUSION (PLIF) Lumbar Four-Five with Pedicle Screw Fixation Lumbar Two-Five;Laminectomy Lumbar Two-Five  . PROSTATE SURGERY    . RIGHT/LEFT HEART CATH AND CORONARY ANGIOGRAPHY N/A 01/24/2018   Procedure:  RIGHT/LEFT HEART CATH AND CORONARY ANGIOGRAPHY;  Surgeon: Nelva Bush, MD;  Location: [place] CV LAB;  Service: Cardiovascular;  Laterality: N/A;  . TONSILLECTOMY    . TOTAL KNEE ARTHROPLASTY  04/01/2012   Procedure: TOTAL KNEE ARTHROPLASTY;  Surgeon: Gearlean Alf, MD;  Location: [facility];  Service: Orthopedics;  Laterality: Left;  . TRANSURETHRAL RESECTION OF PROSTATE    . UMBILICAL HERNIA REPAIR  2011   Social History:   reports that he quit smoking about 12 months ago. His smoking use included cigarettes. He has a 30.00 pack-year smoking history. He has never used smokeless tobacco. He reports current alcohol use. He reports that he does not use drugs.  Family History  Problem Relation Age of Onset  . Heart disease Mother        Onset ~44 y/o  . Hypertension Mother        Deceased from old age at 68  . Hyperlipidemia Mother   . Arthritis Mother   . Diabetes Father        Deceased from old age at 7  . Heart attack Father   . Heart disease Father        CABG at age 69  . Dementia Father 11  . Arthritis Father   . Prostate cancer Maternal Grandfather   . Colon cancer Neg Hx   . Esophageal cancer Neg Hx   . Rectal cancer Neg Hx   . Stomach cancer Neg Hx     Medications: Patient's Medications  New Prescriptions   No medications on file  Previous Medications   ACETAMINOPHEN (TYLENOL) 500 MG TABLET    Take 500 mg by mouth at bedtime.    ALBUTEROL (VENTOLIN HFA) 108 (90 BASE) MCG/ACT INHALER    Inhale 2 puffs into the lungs every 6 (six) hours as needed for wheezing or shortness of breath.   AMLODIPINE (NORVASC) 5 MG TABLET       AZO-CRANBERRY PO    Take 2 tablets by mouth at bedtime.   B COMPLEX VITAMINS CAPSULE    Take 1 capsule by mouth daily.   CHOLECALCIFEROL (D-3-5) 125 MCG (5000 UT) CAPSULE    Take 5,000 Units by mouth daily.   CLOPIDOGREL (PLAVIX) 75 MG TABLET    Take 1 tablet (75 mg total) by mouth daily.   CYANOCOBALAMIN 2000 MCG TABLET    Take 0.5 tablets  (1,000 mcg total) by mouth daily.   DOXYCYCLINE (VIBRA-TABS) 100 MG TABLET    Take 1 tablet (100 mg total) by mouth 2 (two) times daily.   FINASTERIDE (PROSCAR) 5 MG TABLET    Take 1 tablet (5 mg total) by mouth daily.   FLUTICASONE (FLONASE)  50 MCG/ACT NASAL SPRAY    Place 1 spray into both nostrils daily.   GABAPENTIN (NEURONTIN) 300 MG CAPSULE    Take 1 capsule (300 mg total) by mouth at bedtime.   LEVOCETIRIZINE DIHYDROCHLORIDE (XYZAL ALLERGY 24HR PO)    Take 2.5 mg by mouth at bedtime. For Allergies   LOPERAMIDE (IMODIUM) 2 MG CAPSULE    Take 2 mg by mouth as needed.    METHOCARBAMOL (ROBAXIN) 500 MG TABLET    Take 1 tablet (500 mg total) by mouth at bedtime as needed for muscle spasms.   MIRABEGRON ER (MYRBETRIQ) 25 MG TB24 TABLET    Take 25 mg by mouth daily.   OLMESARTAN (BENICAR) 40 MG TABLET    Take 1 tablet (40 mg total) by mouth daily.   ROSUVASTATIN (CRESTOR) 40 MG TABLET    Take 40 mg by mouth daily.   TAMSULOSIN (FLOMAX) 0.4 MG CAPS CAPSULE    Take 1 capsule (0.4 mg total) by mouth daily after supper.  Modified Medications   No medications on file  Discontinued Medications   No medications on file    Physical Exam:  Vitals:   08/13/20 1150  BP: 100/60  Pulse: 61  Resp: 20  Temp: 98.8 F (37.1 C)  TempSrc: Temporal  SpO2: 95%  Weight: 222 lb 3.2 oz (100.8 kg)  Height: 5\' 7"  (1.702 m)   Body mass index is 34.8 kg/m. Wt Readings from Last 3 Encounters:  08/13/20 222 lb 3.2 oz (100.8 kg)  08/06/20 220 lb 12.8 oz (100.2 kg)  07/19/20 225 lb (102.1 kg)    Physical Exam Vitals reviewed.  Constitutional:      General: He is not in acute distress. Cardiovascular:     Rate and Rhythm: Normal rate and regular rhythm.     Pulses: Normal pulses.     Heart sounds: Normal heart sounds.  Pulmonary:     Effort: Pulmonary effort is normal. No respiratory distress.     Breath sounds: Normal breath sounds. No wheezing.  Musculoskeletal:     Right lower leg: No edema.      Left lower leg: No edema.  Skin:    General: Skin is warm and dry.     Capillary Refill: Capillary refill takes less than 2 seconds.     Findings: Lesion present.     Comments: Nickel sized abrasion to left shin with purulent drainage. Abrasion about 2 cm deep. Erythema surrounding abrasion and increased over anterior shin. Tenderness and warmth noted with touch.   Neurological:     General: No focal deficit present.     Mental Status: He is alert and oriented to person, place, and time.  Psychiatric:        Mood and Affect: Mood normal.        Behavior: Behavior normal.     Labs reviewed: Basic Metabolic Panel: Recent Labs    09/25/19 1110 04/08/20 1506  NA  --  141  K  --  4.1  CL  --  104  CO2  --  27  GLUCOSE  --  104*  BUN  --  16  CREATININE  --  0.72  CALCIUM  --  9.1  TSH 1.860 1.54   Liver Function Tests: Recent Labs    09/25/19 1110 04/08/20 1506  AST  --  16  ALT  --  16  BILITOT  --  0.6  PROT 6.4 6.4   No results for input(s): LIPASE, AMYLASE in the  last 8760 hours. No results for input(s): AMMONIA in the last 8760 hours. CBC: Recent Labs    04/08/20 1506  WBC 6.9  NEUTROABS 4,954  HGB 16.5  HCT 47.8  MCV 92.6  PLT 216   Lipid Panel: Recent Labs    04/08/20 1506  CHOL 161  HDL 49  LDLCALC 95  TRIG 81  CHOLHDL 3.3   TSH: Recent Labs    09/25/19 1110 04/08/20 1506  TSH 1.860 1.54   A1C: Lab Results  Component Value Date   HGBA1C 5.5 09/25/2019     Assessment/Plan 1. Cellulitis of left lower extremity - ongoing, symptoms have progressed- increased erythema, tenderness, heat and purulent drainage - day 7 doxycycline - advised patient to report to ED for further evaluation and treatment   I provided 20 minutes of face-to-face time during this encounter.    Next appt: 11/19/2020 Windell Moulding, [facility] [phone]

## 2020-08-13 NOTE — Discharge Instructions (Addendum)
As we discussed, your work up today was reassuring.   We will plan to add an additional antibiotic to your course. Take cipro and doxy for 5 days.   As we discussed, if you have no improvement in 48 hours, return to the emergency department or if the redness starts extending outside of the marked lines, return to emergency department immediately.  If you start developing fever, worsening redness or swelling of the leg or any other worsening concerning symptoms, return to emergency department.

## 2020-08-13 NOTE — ED Triage Notes (Addendum)
Patient states he had a bottle of clorox in a bag and the bag broke causing the Clorox bottle to hit him in the left shin approx 1 1/2 -2 weeks ago. patient has been on oral antibiotics and states he was sent to the ED for Iv antibiotics.

## 2020-08-19 ENCOUNTER — Other Ambulatory Visit: Payer: Self-pay

## 2020-08-19 ENCOUNTER — Ambulatory Visit (INDEPENDENT_AMBULATORY_CARE_PROVIDER_SITE_OTHER): Payer: Medicare Other | Admitting: Podiatry

## 2020-08-19 DIAGNOSIS — D689 Coagulation defect, unspecified: Secondary | ICD-10-CM | POA: Diagnosis not present

## 2020-08-19 DIAGNOSIS — B351 Tinea unguium: Secondary | ICD-10-CM

## 2020-08-19 DIAGNOSIS — L03116 Cellulitis of left lower limb: Secondary | ICD-10-CM | POA: Diagnosis not present

## 2020-08-19 DIAGNOSIS — L97923 Non-pressure chronic ulcer of unspecified part of left lower leg with necrosis of muscle: Secondary | ICD-10-CM

## 2020-08-19 DIAGNOSIS — L02416 Cutaneous abscess of left lower limb: Secondary | ICD-10-CM

## 2020-08-19 MED ORDER — SILVER SULFADIAZINE 1 % EX CREA: TOPICAL_CREAM | CUTANEOUS | 0 refills | Status: DC

## 2020-08-19 NOTE — Progress Notes (Signed)
  Subjective:  Patient ID: Kyle Lewis, male    DOB: 1945/07/04,  MRN: [id number]  No chief complaint on file.  75 y.o. male presents with the above complaint. History confirmed with patient. New complaint of left leg injury - states he was carrying a chlorox bottle in a bag and the bag broke and the handle hit his leg. Was seen in the ED where he was given abx and told to f/u if it worsens. The demarcation line does show the cellulitis to be improving. Also requests care of his nails today.  Objective:  Physical Exam: warm, good capillary refill, nail exam onychomycosis of the toenails, DP pulses palpable, PT pulses palpable and protective sensation intact   Left leg abscess with tunneling at 12 o clock and 6 o clock. Purulent drainage. 50/50 fibrotic/granular base. Surrounding resolving cellulitis.  Assessment:   1. Cellulitis and abscess of left leg   2. Onychomycosis   3. 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  Onychomycosis and Coagulation Defect -Nails palliatively debrided secondary to pain   Procedure: Nail Debridement Type of Debridement: manual, sharp debridement. Instrumentation: Nail nipper, rotary burr. Number of Nails: 10  Abscess left foot -Manually expressed purulence. Wound cultured. Thoroughly cleansed and irrigated and packed with iodoform packing. Patient's wife to pull tomorrow. Continue abx. Will f/u Monday to assess progress, should this worsen will need to go to the ED and consider surgical debridement  Return in about 4 days (around 08/23/2020) for Abscess f/u.

## 2020-08-19 NOTE — Progress Notes (Signed)
Wound culture confirmation code : [phone]

## 2020-08-23 ENCOUNTER — Ambulatory Visit: Payer: Self-pay | Admitting: Podiatry

## 2020-08-23 ENCOUNTER — Encounter: Payer: Self-pay | Admitting: Podiatry

## 2020-08-23 ENCOUNTER — Ambulatory Visit (INDEPENDENT_AMBULATORY_CARE_PROVIDER_SITE_OTHER): Payer: Medicare Other | Admitting: Podiatry

## 2020-08-23 ENCOUNTER — Other Ambulatory Visit: Payer: Self-pay

## 2020-08-23 DIAGNOSIS — L02416 Cutaneous abscess of left lower limb: Secondary | ICD-10-CM | POA: Diagnosis not present

## 2020-08-23 DIAGNOSIS — L03116 Cellulitis of left lower limb: Secondary | ICD-10-CM

## 2020-08-23 DIAGNOSIS — I739 Peripheral vascular disease, unspecified: Secondary | ICD-10-CM | POA: Diagnosis not present

## 2020-08-23 DIAGNOSIS — L97923 Non-pressure chronic ulcer of unspecified part of left lower leg with necrosis of muscle: Secondary | ICD-10-CM

## 2020-08-24 ENCOUNTER — Other Ambulatory Visit: Payer: Self-pay

## 2020-08-24 ENCOUNTER — Ambulatory Visit (INDEPENDENT_AMBULATORY_CARE_PROVIDER_SITE_OTHER): Payer: Medicare Other | Admitting: Orthopedic Surgery

## 2020-08-24 ENCOUNTER — Encounter (HOSPITAL_BASED_OUTPATIENT_CLINIC_OR_DEPARTMENT_OTHER): Payer: Self-pay | Admitting: Podiatry

## 2020-08-24 ENCOUNTER — Encounter: Payer: Self-pay | Admitting: Orthopedic Surgery

## 2020-08-24 ENCOUNTER — Other Ambulatory Visit (HOSPITAL_COMMUNITY)
Admission: RE | Admit: 2020-08-24 | Discharge: 2020-08-24 | Disposition: A | Discharge status: Home / Self Care | Payer: Medicare Other | Source: Ambulatory Visit | Attending: Podiatry | Admitting: Podiatry

## 2020-08-24 VITALS — BP 140/80 | HR 65 | Temp 97.2°F | Resp 20 | Ht 67.0 in | Wt 227.0 lb

## 2020-08-24 DIAGNOSIS — Z20822 Contact with and (suspected) exposure to covid-19: Secondary | ICD-10-CM | POA: Insufficient documentation

## 2020-08-24 DIAGNOSIS — G8929 Other chronic pain: Secondary | ICD-10-CM

## 2020-08-24 DIAGNOSIS — I10 Essential (primary) hypertension: Secondary | ICD-10-CM

## 2020-08-24 DIAGNOSIS — L03116 Cellulitis of left lower limb: Secondary | ICD-10-CM

## 2020-08-24 DIAGNOSIS — Z01818 Encounter for other preprocedural examination: Secondary | ICD-10-CM | POA: Diagnosis not present

## 2020-08-24 DIAGNOSIS — E785 Hyperlipidemia, unspecified: Secondary | ICD-10-CM

## 2020-08-24 DIAGNOSIS — Z01812 Encounter for preprocedural laboratory examination: Secondary | ICD-10-CM | POA: Insufficient documentation

## 2020-08-24 DIAGNOSIS — J449 Chronic obstructive pulmonary disease, unspecified: Secondary | ICD-10-CM

## 2020-08-24 DIAGNOSIS — M5442 Lumbago with sciatica, left side: Secondary | ICD-10-CM

## 2020-08-24 DIAGNOSIS — I350 Nonrheumatic aortic (valve) stenosis: Secondary | ICD-10-CM

## 2020-08-24 LAB — SARS CORONAVIRUS 2 (TAT 6-24 HRS): SARS Coronavirus 2: NEGATIVE

## 2020-08-24 NOTE — Progress Notes (Signed)
Spoke with patient by phone and patient stated he is seeing dr read for pre op clearance at 100 pm today., pt states he wishes to put off surgery due to weather tomorrow, pt given shelley's number at dr price, pt instructed to call me back and let me know if he is having surgery tomorrow.

## 2020-08-24 NOTE — Progress Notes (Signed)
Careteam: Patient Care Team: Yvonna Alanis, NP as PCP - General (Adult Health Nurse Practitioner) Revankar, Reita Cliche, MD as PCP - Cardiology (Cardiology) Druscilla Brownie, MD as Consulting Physician (Dermatology) Garvin Fila, MD as Consulting Physician (Neurology)  Seen by: Windell Moulding, AGNP-C  PLACE OF SERVICE:  [place] Directive information    Allergies  Allergen Reactions  . Cefazolin Rash    The patient had surgery and was given cefazolin intraop. ~ 10 days later he developed a rash confirmed by biopsy to be consistent w/ drug eruption. We cannot know for sure, but this is the most likely agent.      Chief Complaint  Patient presents with  . Pre-op Exam    Surgical clearance      HPI: Patient is a 75 y.o. male seen today for presurgical clearance.   Scheduled for irrigation and debridement of left leg abscess with Dr. Hardie Pulley 08/25/2020.   EKG done in office. Leads V1 and V2 inconclusive, overall normal sinus rhythm. Blood pressure 140/80 with daily amlodipine. He denies chest pain, sob or ankle edema. He does not have a pacemaker or defibrillator.   He quit smoking 03/2019. Denies cough or sob. Wears CPAP at night for OSA.   Advised to stop Plavix until procedure.   No recent hospitalizations or injuries.   Left leg abscess with minimal pain. He was seen by Dr. March Rummage yesterday and advised to schedule surgery. He is currently not taking antibiotics. Abscess repacked and clean dressing place, patient tolerated well.   He is concerned about upcoming weather tomorrow. Worried procedure may be cancelled.     Review of Systems:  Review of Systems  Constitutional: Negative for chills, fever and malaise/fatigue.  HENT: Negative for hearing loss and sore throat.   Eyes: Negative for blurred vision and double vision.  Respiratory: Negative for cough, shortness of breath and wheezing.   Cardiovascular: Negative for chest pain, palpitations,  orthopnea and leg swelling.  Gastrointestinal: Negative for abdominal pain, constipation, diarrhea, heartburn, nausea and vomiting.  Musculoskeletal: Positive for back pain and joint pain. Negative for falls.  Neurological: Negative for dizziness, seizures, weakness and headaches.  Psychiatric/Behavioral: Negative for depression. The patient is not nervous/anxious.     Past Medical History:  Diagnosis Date  . 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 Adenomatous colon polyp   . Adrenal mass ([place])    2 benign appearing left adrenal adenomas noted on 01/13/15 CT  . Allergy    seasonal  . Aortic stenosis    mild to moderate by echo and cath in 09/2016  . Arthritis   . BPH (benign prostatic hyperplasia)   . Carotid artery occlusion   . Cataract   . Chronic back pain   . COPD (chronic obstructive pulmonary disease) ([place])   . Coronary artery disease   . CVA (cerebral infarction)   . Degeneration of cervical intervertebral disc   . Diverticulosis   . Gout   . History of back surgery    Rods and Screws in back  . History of left knee replacement   . Hypertension   . Low back pain   . Meningioma ([place])   . Nocturia   . Peripheral vascular disease ([place])   . Ringing in ear    (SLIGHT)  . Sleep apnea    wears CPAP  . Stroke [facility]) 2013   tia  Past Surgical History:  Procedure Laterality Date  . APPENDECTOMY    . BACK SURGERY     Rods and Screws in Back  . BRAIN MENINGIOMA EXCISION     menigioma  . CARPAL TUNNEL RELEASE     left  . cataracts     both eyes  . EYE SURGERY Bilateral    Cataract  . INGUINAL HERNIA REPAIR    . JOINT REPLACEMENT Left 04-01-12   Knee  . LEFT HEART CATH AND CORONARY ANGIOGRAPHY N/A 09/15/2016   Procedure: Left Heart Cath and Coronary Angiography;  Surgeon: Nelva Bush, MD;  Location: [place] CV LAB;  Service: Cardiovascular;  Laterality: N/A;  . MAXIMUM ACCESS (MAS)POSTERIOR LUMBAR INTERBODY  FUSION (PLIF) 1 LEVEL N/A 07/07/2015   Procedure:  POSTERIOR LUMBAR INTERBODY FUSION (PLIF) Lumbar Four-Five with Pedicle Screw Fixation Lumbar Two-Five;Laminectomy Lumbar Two-Five;  Surgeon: Eustace Moore, MD;  Location: [place] NEURO ORS;  Service: Neurosurgery;  Laterality: N/A;   POSTERIOR LUMBAR INTERBODY FUSION (PLIF) Lumbar Four-Five with Pedicle Screw Fixation Lumbar Two-Five;Laminectomy Lumbar Two-Five  . PROSTATE SURGERY    . RIGHT/LEFT HEART CATH AND CORONARY ANGIOGRAPHY N/A 01/24/2018   Procedure: RIGHT/LEFT HEART CATH AND CORONARY ANGIOGRAPHY;  Surgeon: Nelva Bush, MD;  Location: [place] CV LAB;  Service: Cardiovascular;  Laterality: N/A;  . TONSILLECTOMY    . TOTAL KNEE ARTHROPLASTY  04/01/2012   Procedure: TOTAL KNEE ARTHROPLASTY;  Surgeon: Gearlean Alf, MD;  [facility];  Service: Orthopedics;  Laterality: Left;  . TRANSURETHRAL RESECTION OF PROSTATE    . UMBILICAL HERNIA REPAIR  2011   Social History:   reports that he quit smoking about 13 months ago. His smoking use included cigarettes. He has a 30.00 pack-year smoking history. He has never used smokeless tobacco. He reports current alcohol use. He reports that he does not use drugs.  Family History  Problem Relation Age of Onset  . Heart disease Mother        Onset ~1 y/o  . Hypertension Mother        Deceased from old age at 42  . Hyperlipidemia Mother   . Arthritis Mother   . Diabetes Father        Deceased from old age at 92  . Heart attack Father   . Heart disease Father        CABG at age 73  . Dementia Father 47  . Arthritis Father   . Prostate cancer Maternal Grandfather   . Colon cancer Neg Hx   . Esophageal cancer Neg Hx   . Rectal cancer Neg Hx   . Stomach cancer Neg Hx     Medications: Patient's Medications  New Prescriptions   No medications on file  Previous Medications   ACETAMINOPHEN (TYLENOL) 500 MG TABLET    Take 500 mg by mouth at bedtime.    ALBUTEROL (VENTOLIN HFA) 108 (90  BASE) MCG/ACT INHALER    Inhale 2 puffs into the lungs every 6 (six) hours as needed for wheezing or shortness of breath.   AMLODIPINE (NORVASC) 5 MG TABLET       AZO-CRANBERRY PO    Take 2 tablets by mouth at bedtime.   B COMPLEX VITAMINS CAPSULE    Take 1 capsule by mouth daily.   CHOLECALCIFEROL (D-3-5) 125 MCG (5000 UT) CAPSULE    Take 5,000 Units by mouth daily.   CIPROFLOXACIN (CIPRO) 500 MG TABLET    Take 1 tablet (500 mg total) by mouth every 12 (  twelve) hours.   CLOPIDOGREL (PLAVIX) 75 MG TABLET    Take 1 tablet (75 mg total) by mouth daily.   CYANOCOBALAMIN 2000 MCG TABLET    Take 0.5 tablets (1,000 mcg total) by mouth daily.   FINASTERIDE (PROSCAR) 5 MG TABLET    Take 1 tablet (5 mg total) by mouth daily.   FLUTICASONE (FLONASE) 50 MCG/ACT NASAL SPRAY    Place 1 spray into both nostrils daily.   GABAPENTIN (NEURONTIN) 300 MG CAPSULE    Take 1 capsule (300 mg total) by mouth at bedtime.   LEVOCETIRIZINE DIHYDROCHLORIDE (XYZAL ALLERGY 24HR PO)    Take 2.5 mg by mouth at bedtime. For Allergies   LOPERAMIDE (IMODIUM) 2 MG CAPSULE    Take 2 mg by mouth as needed.    METHOCARBAMOL (ROBAXIN) 500 MG TABLET    Take 1 tablet (500 mg total) by mouth at bedtime as needed for muscle spasms.   MIRABEGRON ER (MYRBETRIQ) 25 MG TB24 TABLET    Take 25 mg by mouth daily.   OLMESARTAN (BENICAR) 40 MG TABLET    Take 1 tablet (40 mg total) by mouth daily.   ROSUVASTATIN (CRESTOR) 40 MG TABLET    Take 40 mg by mouth daily.   SILVER SULFADIAZINE (SILVADENE) 1 % CREAM    Apply pea-sized amount to wound daily.   TAMSULOSIN (FLOMAX) 0.4 MG CAPS CAPSULE    Take 1 capsule (0.4 mg total) by mouth daily after supper.  Modified Medications   No medications on file  Discontinued Medications   No medications on file    Physical Exam:  Vitals:   08/24/20 1256  BP: 140/80  Pulse: 65  Resp: 20  SpO2: 94%  Weight: 227 lb (103 kg)  Height: 5\' 7"  (1.702 m)   Body mass index is 35.55 kg/m. Wt Readings from  Last 3 Encounters:  08/24/20 227 lb (103 kg)  08/13/20 222 lb 3.2 oz (100.8 kg)  08/13/20 222 lb 3.2 oz (100.8 kg)    Physical Exam Vitals reviewed.  Constitutional:      General: He is not in acute distress. HENT:     Head: Normocephalic.     Right Ear: There is no impacted cerumen.     Left Ear: There is no impacted cerumen.     Nose: Nose normal.     Mouth/Throat:     Mouth: Mucous membranes are moist.     Pharynx: No posterior oropharyngeal erythema.  Eyes:     General:        Right eye: No discharge.        Left eye: No discharge.  Neck:     Vascular: No carotid bruit.  Cardiovascular:     Rate and Rhythm: Normal rate and regular rhythm.     Pulses: Normal pulses.     Heart sounds: Murmur heard.    Pulmonary:     Effort: Pulmonary effort is normal. No respiratory distress.     Breath sounds: Normal breath sounds. No wheezing.  Abdominal:     General: Bowel sounds are normal. There is no distension.     Palpations: Abdomen is soft.     Tenderness: There is no abdominal tenderness.  Musculoskeletal:     Cervical back: Normal range of motion.     Right lower leg: No edema.     Left lower leg: No edema.  Lymphadenopathy:     Cervical: No cervical adenopathy.  Skin:    General: Skin is warm and dry.  Capillary Refill: Capillary refill takes less than 2 seconds.     Findings: Lesion present.     Comments: Left leg abscess with tunneling present. Drainage purulent. Erythema and effusion present. Tenderness noted during dressing change. New iodiform packing applied, covered with mepilex.   Neurological:     General: No focal deficit present.     Mental Status: He is alert and oriented to person, place, and time.     Motor: No weakness.     Gait: Gait normal.  Psychiatric:        Mood and Affect: Mood normal.        Behavior: Behavior normal.     Labs reviewed: Basic Metabolic Panel: Recent Labs    09/25/19 1110 04/08/20 1506 08/13/20 1348  NA  --  141  140  K  --  4.1 4.4  CL  --  104 107  CO2  --  27 28  GLUCOSE  --  104* 115*  BUN  --  16 18  CREATININE  --  0.72 0.69  CALCIUM  --  9.1 8.9  TSH 1.860 1.54  --    Liver Function Tests: Recent Labs    09/25/19 1110 04/08/20 1506 08/13/20 1348  AST  --  16 16  ALT  --  16 17  ALKPHOS  --   --  66  BILITOT  --  0.6 1.2  PROT 6.4 6.4 6.4*  ALBUMIN  --   --  3.8   No results for input(s): LIPASE, AMYLASE in the last 8760 hours. No results for input(s): AMMONIA in the last 8760 hours. CBC: Recent Labs    04/08/20 1506 08/13/20 1348  WBC 6.9 8.7  NEUTROABS 4,954 6.9  HGB 16.5 14.7  HCT 47.8 43.9  MCV 92.6 95.6  PLT 216 195   Lipid Panel: Recent Labs    04/08/20 1506  CHOL 161  HDL 49  LDLCALC 95  TRIG 81  CHOLHDL 3.3   TSH: Recent Labs    09/25/19 1110 04/08/20 1506  TSH 1.860 1.54   A1C: Lab Results  Component Value Date   HGBA1C 5.5 09/25/2019     Assessment/Plan 1. Pre-op exam - leads V1 and V2 inconclusive, otherwise NRS, murmur noted - CHADS-VASC score 3 - advised to stop plavix prior to procedure - EKG 12-Lead  2. Cellulitis of left lower extremity - ongoing, followed by Dr. March Rummage - I/D scheduled tomorrow  3. Chronic bilateral low back pain with left-sided sciatica - ongoing, scheduled for back surgery 08/30/2020 - he may postpone until abscess heals  4. Morbid obesity (HCC) - BMI 35 - activity limited by low back pain  encourage healthy eating and limiting calroies  5. COPD GOLD II - does not require oxygen - cont prn albuterol  6. Moderate aortic stenosis - followed by cardiology, no symptoms at this time  7. Hyperlipidemia LDL goal <70 -LDL 95, goal < 70 - cont crestor - lipid panel- future  8. Essential hypertension - bp at goal with amlodipine - cont low sodium diet  I provided 30 minutes of face-to-face time during this encounter.      Next appt: 11/19/2020 Windell Moulding, [place] [phone]

## 2020-08-24 NOTE — Progress Notes (Signed)
Spoke w/ via phone for pre-op interview---pt  Lab needs dos----I stat                Lab results-----see below- COVID test ------08-24-2020 1500 Arrive at -------900 07-28-2020 NPO after MN NO Solid Food.  Clear liquids from MN until---800 am then npo Med rec completed Medications to take morning of surgery -----albuterol inhaler prn/bring inhaler, finasteride, amlodipine, rosuvastatin Diabetic medication -----n/a Patient instructed to bring photo id and insurance card day of surgery Patient aware to have Driver (ride ) / caregiver   [name] spouse  for 24 hours after surgery  Patient Special Instructions ----bring bipap maks tubing and machine and leave in car Pre-Op special Istructions -----none Patient verbalized understanding of instructions that were given at this phone interview. Patient denies shortness of breath, chest pain, fever, cough at this phone interview.  H & P amy [place] np 08-25-2020 on chart/epic  ekg 08-24-2020 on chart/epic old ekg 01-17-2018 chart/epic lov cardiology dr Geraldo Pitter 11-18-2018 epic.chart Echo 01-31-2018 epic Cardiac cath 01-24-2018 epic 111-07-2018 pulmonary lov dr wert epic pf test 04-07-2019 epic Chest 2 view 04-07-2019 epic plavix last dose was 07-26-2020 per patient  Reviewed patient chart with dr Legrand [place] foster mda and pt to be assessed by anesthesia tomorrow and ok for [facility] per dr Legrand [place] foster mda

## 2020-08-24 NOTE — Patient Instructions (Signed)

## 2020-08-24 NOTE — Progress Notes (Signed)
  Subjective:  Patient ID: Kyle Lewis, male    DOB: Nov 11, 1945,  MRN: [id number]  Chief Complaint  Patient presents with  . Foot Ulcer    It seems to be getting better on the left leg area   75 y.o. male presents with the above complaint. Takin the abx as directed. Wife pulled his packing the day after his visit as directed.   Objective:  Physical Exam: warm, good capillary refill, nail exam onychomycosis of the toenails, DP pulses palpable, PT pulses palpable and protective sensation intact  Left leg abscess with tunneling at 12 o clock and 6 o clock. Purulent drainage. 64/40 fibrotic/granular base. Resolving cellulitis  Assessment:   1. Cellulitis and abscess of left leg   2. Ulcer of left lower leg, with necrosis of muscle ([place])   3. Peripheral vascular disease ([place])    Plan:  Patient was evaluated and treated and all questions answered.  Abscess left leg -While the cellulitis is decreased he still has some deep purulence that was expressible today. I think it would be best to perform a deep debridement as the antibiotics alone are not allowing for full resolution of the abscess. Discussed this with patient who agrees to proceed. -Patient has failed all conservative therapy and wishes to proceed with surgical intervention. All risks, benefits, and alternatives discussed with patient. No guarantees given. Consent reviewed and signed by patient. -Planned procedures: Left leg debridement and irrigation of abcess -ASA 3 - Patient with moderate systemic disease with functional limitations   No follow-ups on file.

## 2020-08-24 NOTE — H&P (View-Only) (Signed)
  Subjective:  Patient ID: Kyle Lewis, male    DOB: 12-05-1945,  MRN: [id number]  Chief Complaint  Patient presents with  . Foot Ulcer    It seems to be getting better on the left leg area   75 y.o. male presents with the above complaint. Takin the abx as directed. Wife pulled his packing the day after his visit as directed.   Objective:  Physical Exam: warm, good capillary refill, nail exam onychomycosis of the toenails, DP pulses palpable, PT pulses palpable and protective sensation intact  Left leg abscess with tunneling at 12 o clock and 6 o clock. Purulent drainage. 64/40 fibrotic/granular base. Resolving cellulitis  Assessment:   1. Cellulitis and abscess of left leg   2. Ulcer of left lower leg, with necrosis of muscle ([place])   3. Peripheral vascular disease ([place])    Plan:  Patient was evaluated and treated and all questions answered.  Abscess left leg -While the cellulitis is decreased he still has some deep purulence that was expressible today. I think it would be best to perform a deep debridement as the antibiotics alone are not allowing for full resolution of the abscess. Discussed this with patient who agrees to proceed. -Patient has failed all conservative therapy and wishes to proceed with surgical intervention. All risks, benefits, and alternatives discussed with patient. No guarantees given. Consent reviewed and signed by patient. -Planned procedures: Left leg debridement and irrigation of abcess -ASA 3 - Patient with moderate systemic disease with functional limitations   No follow-ups on file.

## 2020-08-25 ENCOUNTER — Ambulatory Visit (HOSPITAL_BASED_OUTPATIENT_CLINIC_OR_DEPARTMENT_OTHER): Payer: Medicare Other | Admitting: Certified Registered Nurse Anesthetist

## 2020-08-25 ENCOUNTER — Encounter: Payer: Self-pay | Admitting: Podiatry

## 2020-08-25 ENCOUNTER — Encounter (HOSPITAL_BASED_OUTPATIENT_CLINIC_OR_DEPARTMENT_OTHER): Payer: Self-pay | Admitting: Podiatry

## 2020-08-25 ENCOUNTER — Other Ambulatory Visit: Payer: Self-pay

## 2020-08-25 ENCOUNTER — Ambulatory Visit (HOSPITAL_BASED_OUTPATIENT_CLINIC_OR_DEPARTMENT_OTHER)
Admission: RE | Admit: 2020-08-25 | Discharge: 2020-08-25 | Disposition: A | Discharge status: Home / Self Care | Payer: Medicare Other | Attending: Podiatry | Admitting: Podiatry

## 2020-08-25 ENCOUNTER — Encounter (HOSPITAL_BASED_OUTPATIENT_CLINIC_OR_DEPARTMENT_OTHER)
Admission: RE | Disposition: A | Discharge status: Home / Self Care | Payer: Self-pay | Source: Home / Self Care | Attending: Podiatry

## 2020-08-25 DIAGNOSIS — L02416 Cutaneous abscess of left lower limb: Secondary | ICD-10-CM | POA: Insufficient documentation

## 2020-08-25 DIAGNOSIS — I70248 Atherosclerosis of native arteries of left leg with ulceration of other part of lower left leg: Secondary | ICD-10-CM | POA: Diagnosis not present

## 2020-08-25 DIAGNOSIS — L03116 Cellulitis of left lower limb: Secondary | ICD-10-CM | POA: Diagnosis not present

## 2020-08-25 DIAGNOSIS — L97823 Non-pressure chronic ulcer of other part of left lower leg with necrosis of muscle: Secondary | ICD-10-CM | POA: Insufficient documentation

## 2020-08-25 HISTORY — DX: Cutaneous abscess of left lower limb: L02.416

## 2020-08-25 HISTORY — PX: IRRIGATION AND DEBRIDEMENT ABSCESS: SHX5252

## 2020-08-25 LAB — POCT I-STAT, CHEM 8
BUN: 23 mg/dL (ref 8–23)
Calcium, Ion: 1.28 mmol/L (ref 1.15–1.40)
Chloride: 101 mmol/L (ref 98–111)
Creatinine, Ser: 0.7 mg/dL (ref 0.61–1.24)
Glucose, Bld: 115 mg/dL — ABNORMAL HIGH (ref 70–99)
HCT: 45 % (ref 39.0–52.0)
Hemoglobin: 15.3 g/dL (ref 13.0–17.0)
Potassium: 4.1 mmol/L (ref 3.5–5.1)
Sodium: 142 mmol/L (ref 135–145)
TCO2: 31 mmol/L (ref 22–32)

## 2020-08-25 SURGERY — IRRIGATION AND DEBRIDEMENT ABSCESS
Anesthesia: Monitor Anesthesia Care | Laterality: Left

## 2020-08-25 MED ORDER — LACTATED RINGERS IV SOLN: INTRAVENOUS | Status: DC | PRN

## 2020-08-25 MED ORDER — PROPOFOL 500 MG/50ML IV EMUL
INTRAVENOUS | Status: AC
  Filled 2020-08-25: qty 50

## 2020-08-25 MED ORDER — FENTANYL CITRATE (PF) 100 MCG/2ML IJ SOLN
25.0000 ug | INTRAMUSCULAR | Status: DC | PRN
Start: 2020-08-25 — End: 2020-08-25

## 2020-08-25 MED ORDER — PROPOFOL 500 MG/50ML IV EMUL
INTRAVENOUS | Status: DC | PRN
  Administered 2020-08-25: 200 ug/kg/min via INTRAVENOUS

## 2020-08-25 MED ORDER — EPHEDRINE SULFATE-NACL 50-0.9 MG/10ML-% IV SOSY
PREFILLED_SYRINGE | INTRAVENOUS | Status: DC | PRN
  Administered 2020-08-25 (×2): 10 mg via INTRAVENOUS

## 2020-08-25 MED ORDER — IPRATROPIUM-ALBUTEROL 0.5-2.5 (3) MG/3ML IN SOLN
3.0000 mL | RESPIRATORY_TRACT | Status: DC
  Administered 2020-08-25: 3 mL via RESPIRATORY_TRACT

## 2020-08-25 MED ORDER — FENTANYL CITRATE (PF) 100 MCG/2ML IJ SOLN
INTRAMUSCULAR | Status: AC
  Filled 2020-08-25: qty 2

## 2020-08-25 MED ORDER — MIDAZOLAM HCL 5 MG/5ML IJ SOLN
INTRAMUSCULAR | Status: DC | PRN
  Administered 2020-08-25: 1 mg via INTRAVENOUS

## 2020-08-25 MED ORDER — PROPOFOL 500 MG/50ML IV EMUL
INTRAVENOUS | Status: AC
  Filled 2020-08-25: qty 100

## 2020-08-25 MED ORDER — FENTANYL CITRATE (PF) 250 MCG/5ML IJ SOLN
INTRAMUSCULAR | Status: DC | PRN
  Administered 2020-08-25: 25 ug via INTRAVENOUS

## 2020-08-25 MED ORDER — ONDANSETRON HCL 4 MG/2ML IJ SOLN
INTRAMUSCULAR | Status: DC | PRN
  Administered 2020-08-25: 4 mg via INTRAVENOUS

## 2020-08-25 MED ORDER — LACTATED RINGERS IV SOLN
INTRAVENOUS | Status: DC
  Administered 2020-08-25: 1000 mL via INTRAVENOUS

## 2020-08-25 MED ORDER — PROPOFOL 10 MG/ML IV BOLUS
INTRAVENOUS | Status: AC
  Filled 2020-08-25: qty 20

## 2020-08-25 MED ORDER — CLINDAMYCIN PHOSPHATE 900 MG/50ML IV SOLN
INTRAVENOUS | Status: AC
  Filled 2020-08-25: qty 50

## 2020-08-25 MED ORDER — CIPROFLOXACIN HCL 500 MG PO TABS: 500.0000 mg | ORAL_TABLET | Freq: Two times a day (BID) | ORAL | 0 refills | Status: AC

## 2020-08-25 MED ORDER — BUPIVACAINE HCL (PF) 0.5 % IJ SOLN
INTRAMUSCULAR | Status: DC | PRN
  Administered 2020-08-25: 10 mL

## 2020-08-25 MED ORDER — CLINDAMYCIN PHOSPHATE 900 MG/50ML IV SOLN
900.0000 mg | INTRAVENOUS | Status: AC
  Administered 2020-08-25: 900 mg via INTRAVENOUS

## 2020-08-25 MED ORDER — ACETAMINOPHEN 10 MG/ML IV SOLN: 1000.0000 mg | Freq: Once | INTRAVENOUS | Status: DC | PRN

## 2020-08-25 MED ORDER — ONDANSETRON HCL 4 MG/2ML IJ SOLN: 4.0000 mg | Freq: Once | INTRAMUSCULAR | Status: DC | PRN

## 2020-08-25 MED ORDER — IPRATROPIUM-ALBUTEROL 0.5-2.5 (3) MG/3ML IN SOLN
RESPIRATORY_TRACT | Status: AC
  Filled 2020-08-25: qty 3

## 2020-08-25 MED ORDER — MIDAZOLAM HCL 2 MG/2ML IJ SOLN
INTRAMUSCULAR | Status: AC
  Filled 2020-08-25: qty 2

## 2020-08-25 SURGICAL SUPPLY — 61 items
APL PRP STRL LF DISP 70% ISPRP (MISCELLANEOUS)
BLADE SURG 15 STRL LF DISP TIS (BLADE) ×1 IMPLANT
BLADE SURG 15 STRL SS (BLADE) ×2
BNDG CMPR 9X4 STRL LF SNTH (GAUZE/BANDAGES/DRESSINGS)
BNDG ELASTIC 3X5.8 VLCR STR LF (GAUZE/BANDAGES/DRESSINGS) ×2 IMPLANT
BNDG ELASTIC 4X5.8 VLCR STR LF (GAUZE/BANDAGES/DRESSINGS) ×2 IMPLANT
BNDG ESMARK 4X9 LF (GAUZE/BANDAGES/DRESSINGS) IMPLANT
BNDG GAUZE ELAST 4 BULKY (GAUZE/BANDAGES/DRESSINGS) ×2 IMPLANT
CHLORAPREP W/TINT 26 (MISCELLANEOUS) IMPLANT
CNTNR URN SCR LID CUP LEK RST (MISCELLANEOUS) IMPLANT
CONT SPEC 4OZ STRL OR WHT (MISCELLANEOUS)
COVER BACK TABLE 60X90IN (DRAPES) ×2 IMPLANT
COVER WAND RF STERILE (DRAPES) ×2 IMPLANT
CUFF TOURN SGL QUICK 18X4 (TOURNIQUET CUFF) IMPLANT
CUFF TOURN SGL QUICK 24 (TOURNIQUET CUFF)
CUFF TRNQT CYL 24X4X16.5-23 (TOURNIQUET CUFF) IMPLANT
DRAPE 3/4 80X56 (DRAPES) ×2 IMPLANT
DRAPE EXTREMITY T 121X128X90 (DISPOSABLE) ×2 IMPLANT
DRAPE SHEET LG 3/4 BI-LAMINATE (DRAPES) ×2 IMPLANT
DRAPE U-SHAPE 47X51 STRL (DRAPES) ×2 IMPLANT
DRSG TEGADERM 4X4.75 (GAUZE/BANDAGES/DRESSINGS) ×2 IMPLANT
ELECT REM PT RETURN 9FT ADLT (ELECTROSURGICAL) ×2
ELECTRODE REM PT RTRN 9FT ADLT (ELECTROSURGICAL) ×1 IMPLANT
GAUZE SPONGE 4X4 12PLY STRL (GAUZE/BANDAGES/DRESSINGS) ×2 IMPLANT
GAUZE XEROFORM 1X8 LF (GAUZE/BANDAGES/DRESSINGS) ×2 IMPLANT
GLOVE SRG 8 PF TXTR STRL LF DI (GLOVE) ×1 IMPLANT
GLOVE SURG ENC MOIS LTX SZ7.5 (GLOVE) ×2 IMPLANT
GLOVE SURG UNDER POLY LF SZ8 (GLOVE) ×2
GOWN STRL REUS W/TWL XL LVL3 (GOWN DISPOSABLE) ×2 IMPLANT
KIT TURNOVER CYSTO (KITS) ×2 IMPLANT
MANIFOLD NEPTUNE II (INSTRUMENTS) ×2 IMPLANT
MATRIX WOUND FLOWABLE 3CC (Tissue) ×2 IMPLANT
MICROMATRIX 1000MG (Tissue) ×2 IMPLANT
NEEDLE HYPO 25X1 1.5 SAFETY (NEEDLE) ×2 IMPLANT
NS IRRIG 1000ML POUR BTL (IV SOLUTION) IMPLANT
PACK BASIN DAY SURGERY FS (CUSTOM PROCEDURE TRAY) ×2 IMPLANT
PADDING CAST ABS 4INX4YD NS (CAST SUPPLIES) ×1
PADDING CAST ABS COTTON 4X4 ST (CAST SUPPLIES) ×1 IMPLANT
PENCIL SMOKE EVAC W/HOLSTER (ELECTROSURGICAL) ×2 IMPLANT
PROBE DEBRIDE SHARPVAC MISONIX (TIP) ×2 IMPLANT
SET IRRIG Y TYPE TUR BLADDER L (SET/KITS/TRAYS/PACK) IMPLANT
SOLUTION PARTIC MCRMTRX 1000MG (Tissue) ×1 IMPLANT
SPONGE LAP 4X18 RFD (DISPOSABLE) ×2 IMPLANT
STAPLER VISISTAT 35W (STAPLE) IMPLANT
STOCKINETTE 6  STRL (DRAPES) ×2
STOCKINETTE 6 STRL (DRAPES) ×1 IMPLANT
SUCTION FRAZIER HANDLE 10FR (MISCELLANEOUS)
SUCTION TUBE FRAZIER 10FR DISP (MISCELLANEOUS) IMPLANT
SUT ETHILON 3 0 PS 1 (SUTURE) IMPLANT
SUT ETHILON 4 0 PS 2 18 (SUTURE) IMPLANT
SUT MNCRL AB 3-0 PS2 18 (SUTURE) IMPLANT
SUT MNCRL AB 4-0 PS2 18 (SUTURE) IMPLANT
SUT VIC AB 2-0 SH 27 (SUTURE)
SUT VIC AB 2-0 SH 27XBRD (SUTURE) IMPLANT
SYR BULB EAR ULCER 3OZ GRN STR (SYRINGE) ×2 IMPLANT
SYR CONTROL 10ML LL (SYRINGE) ×2 IMPLANT
TIP PROBE CYLINDRICAL MISONIX (TIP) ×2 IMPLANT
TRAY DSU PREP LF (CUSTOM PROCEDURE TRAY) ×2 IMPLANT
TUBE IRRIGATION SET MISONIX (TUBING) ×4 IMPLANT
UNDERPAD 30X36 HEAVY ABSORB (UNDERPADS AND DIAPERS) ×2 IMPLANT
YANKAUER SUCT BULB TIP NO VENT (SUCTIONS) ×2 IMPLANT

## 2020-08-25 NOTE — Anesthesia Preprocedure Evaluation (Addendum)
Anesthesia Evaluation  Patient identified by MRN, date of birth, ID band Patient awake    Reviewed: Allergy & Precautions, NPO status , Patient's Chart, lab work & pertinent test results  Airway Mallampati: II  TM Distance: >3 FB Neck ROM: Full    Dental  (+) Teeth Intact   Pulmonary sleep apnea and Continuous Positive Airway Pressure Ventilation , COPD,  COPD inhaler, former smoker,    Pulmonary exam normal        Cardiovascular hypertension, Pt. on medications + CAD and + Peripheral Vascular Disease (AAA s/p repair 2019, on Plavix)   Rhythm:Regular Rate:Normal     Neuro/Psych Anxiety Depression CVA (2013), No Residual Symptoms    GI/Hepatic negative GI ROS, Neg liver ROS,   Endo/Other  negative endocrine ROS  Renal/GU Renal disease  negative genitourinary   Musculoskeletal  (+) Arthritis , Osteoarthritis,  Left thigh abscess    Abdominal (+)  Abdomen: soft.    Peds  Hematology  (+) anemia ,   Anesthesia Other Findings   Reproductive/Obstetrics                            Anesthesia Physical Anesthesia Plan  ASA: III  Anesthesia Plan: MAC   Post-op Pain Management:    Induction: Intravenous  PONV Risk Score and Plan: 1 and Ondansetron, Dexamethasone, Propofol infusion and Treatment may vary due to age or medical condition  Airway Management Planned: Simple Face Mask, Natural Airway and Nasal Cannula  Additional Equipment: None  Intra-op Plan:   Post-operative Plan:   Informed Consent: I have reviewed the patients History and Physical, chart, labs and discussed the procedure including the risks, benefits and alternatives for the proposed anesthesia with the patient or authorized representative who has indicated his/her understanding and acceptance.     Dental advisory given  Plan Discussed with: CRNA  Anesthesia Plan Comments: (ECHO 2019: - Left ventricle: The cavity  size was normal. There was moderate  focal basal hypertrophy of the septum. Systolic function was  vigorous. The estimated ejection fraction was in the range of 65%  to 70%. Wall motion was normal; there were no regional wall  motion abnormalities. Doppler parameters are consistent with  abnormal left ventricular relaxation (grade 1 diastolic  dysfunction).  - Aortic valve: Trileaflet; severely thickened, severely calcified  leaflets. Valve mobility was restricted. There was moderate  stenosis. Peak velocity (S): 366 cm/s. Mean gradient (S): 32 mm  Hg. Peak gradient (S): 54 mm Hg. Valve area (VTI): 1.23 cm^2.  Valve area (Vmax): 1.4 cm^2. Valve area (Vmean): 1.37 cm^2.  - Mitral valve: There was trivial regurgitation.  - Pulmonary arteries: Systolic pressure was mildly increased. PA  peak pressure: 33 mm Hg (S). Lab Results      Component                Value               Date                      WBC                      8.7                 08/13/2020                HGB  15.3                08/25/2020                HCT                      45.0                08/25/2020                MCV                      95.6                08/13/2020                PLT                      195                 08/13/2020           Lab Results      Component                Value               Date                      NA                       142                 08/25/2020                K                        4.1                 08/25/2020                CO2                      28                  08/13/2020                GLUCOSE                  115 (H)             08/25/2020                BUN                      23                  08/25/2020                CREATININE               0.70                08/25/2020                CALCIUM                  8.9  08/13/2020                GFRNONAA                 >60                  08/13/2020                GFRAA                    107                 04/08/2020          )        Anesthesia Quick Evaluation

## 2020-08-25 NOTE — Anesthesia Postprocedure Evaluation (Signed)
Anesthesia Post Note  Patient: Kyle Lewis  Procedure(s) Performed: IRRIGATION AND DEBRIDEMENT ABSCESS LEFT LEG (Left )     Patient location during evaluation: PACU Anesthesia Type: MAC Level of consciousness: awake and alert Pain management: pain level controlled Vital Signs Assessment: post-procedure vital signs reviewed and stable Respiratory status: spontaneous breathing, nonlabored ventilation, respiratory function stable and patient connected to nasal cannula oxygen Cardiovascular status: stable and blood pressure returned to baseline Postop Assessment: no apparent nausea or vomiting Anesthetic complications: no   No complications documented.  Last Vitals:  Vitals:   08/25/20 1300 08/25/20 1326  BP: 91/70 124/64  Pulse: 86 80  Resp: 19 18  Temp:  36.5 C  SpO2: 92% (S) (!) 87%    Last Pain:  Vitals:   08/25/20 1326  TempSrc: Axillary  PainSc: 0-No pain                 Belenda Cruise P Kyden Potash

## 2020-08-25 NOTE — H&P (Signed)
Anesthesia H&P Update: History and Physical Exam reviewed; patient is OK for planned anesthetic and procedure. ? ?

## 2020-08-25 NOTE — Transfer of Care (Signed)
Immediate Anesthesia Transfer of Care Note  Patient: Kyle Lewis  Procedure(s) Performed: IRRIGATION AND DEBRIDEMENT ABSCESS LEFT LEG (Left )  Patient Location: PACU  Anesthesia Type:MAC  Level of Consciousness: patient cooperative and responds to stimulation  Airway & Oxygen Therapy: Patient Spontanous Breathing and Patient connected to face mask oxygen  Post-op Assessment: Report given to RN and Post -op Vital signs reviewed and stable  Post vital signs: Reviewed and stable  Last Vitals:  Vitals Value Taken Time  BP 86/61 08/25/20 1207  Temp    Pulse 97 08/25/20 1209  Resp 23 08/25/20 1209  SpO2 99 % 08/25/20 1209  Vitals shown include unvalidated device data.  Last Pain:  Vitals:   08/25/20 1014  TempSrc: Oral  PainSc: 0-No pain      Patients Stated Pain Goal: 6 (51/70/01 7494)  Complications: No complications documented.

## 2020-08-25 NOTE — Op Note (Signed)
Patient Name: Kyle Lewis DOB: [birth year]  MRN: [id number]   Date of Service: 08/25/20   Surgeon: Dr. Hardie Pulley, DPM Assistants: None Pre-operative Diagnosis: Abscess and ulcer of left leg Post-operative Diagnosis: same Procedures:             1) Incision and drainage of left leg abscess  2) Application of skin graft substitute. Pathology/Specimens: None Anesthesia: MAC/local Hemostasis: Anatomic Estimated Blood Loss: 1m Materials: Implant Name Type Inv. Item Serial No. Manufacturer Lot No. LRB No. Used Action  MICRO MATRIX STANDARD PARTICULATE   [id number]  [id number] 1 Implanted  MATRIX WOUND FLOWABLE 3CC - [id number] MATRIX WOUND FLOWABLE 3CC  [employer] [id number] 1 Implanted    Medications: 10 ml marcaine Complications: None  Indications for Procedure:  This is a 75y.o. male with a wound to the left leg as a result of a direct injury. The wound has had continued purulent drainage despite abx, and presents today for further treatment.   Procedure in Detail: Patient was identified in pre-operative holding area. Formal consent was signed and the left lower extremity was marked. Patient was brought back to the operating room and placed on the operating room table in the supine position. Anesthesia was induced.   The extremity was prepped and draped in the usual sterile fashion. Timeout was taken to confirm patient name, laterality, and procedure prior to incision. Attention was then directed to the left leg where a wound measuring 2x1 was encountered. The wound tunneled at 12 O'clock position. There was only mild purulence expressible.   The wound was sharply excisionally debrided with a 15 blade, followed by a misonix ultrasonic debrider. Debridement was performed to bleeding, viable wound base. The wound was debrided to and including the level of the muscle tissue. Following debridement the wound measured 2x1x2. Integra flowable matrix was applied to the  tunneling aspect of the wound. Micromatrix powder was hydrated into a paste and applied to the wound base.   The foot was then dressed with xeroform, 4x4, Tegaderm, and ACE bandage. Patient tolerated the procedure well.   Disposition: Following a period of post-operative monitoring, patient will be transferred back home.

## 2020-08-25 NOTE — Interval H&P Note (Signed)
History and Physical Interval Note:  08/25/2020 11:05 AM  Kyle Lewis  has presented today for surgery, with the diagnosis of Abscess left leg.  The various methods of treatment have been discussed with the patient and family. After consideration of risks, benefits and other options for treatment, the patient has consented to  Procedure(s): IRRIGATION AND DEBRIDEMENT ABSCESS LEFT LEG (Left) as a surgical intervention.  The patient's history has been reviewed, patient examined, no change in status, stable for surgery.  I have reviewed the patient's chart and labs.  Questions were answered to the patient's satisfaction.     Evelina Bucy

## 2020-08-25 NOTE — Anesthesia Procedure Notes (Signed)
Procedure Name: MAC Date/Time: 08/25/2020 11:34 AM Performed by: Rogers Blocker, CRNA Pre-anesthesia Checklist: Patient identified, Emergency Drugs available, Suction available, Patient being monitored and Timeout performed Oxygen Delivery Method: Simple face mask Placement Confirmation: positive ETCO2 and CO2 detector

## 2020-08-25 NOTE — Discharge Instructions (Signed)
  After Surgery Instructions   1) If you are recuperating from surgery anywhere other than home, please be sure to leave [place] the number where you can be reached.  2) Go directly home and rest.  3) Keep the operated foot(feet) elevated six inches above the hip when sitting or lying down. This will help control swelling and pain.  4) Support the elevated foot and leg with pillows. DO NOT PLACE PILLOWS UNDER THE KNEE.  5) DO NOT REMOVE or get your bandages WET, unless you were given different instructions by your doctor to do so. This increases the risk of infection.  6) Wear your surgical shoe or surgical boot at all times when you are up on your feet.  7) A limited amount of pain and swelling may occur. The skin may take on a bruised appearance. DO NOT BE ALARMED, THIS IS NORMAL.  8) For slight pain and swelling, apply an ice pack directly over the bandages for 15 minutes only out of each hour of the day. Continue until seen in the office for your first post op visit. DO NOT APPLY ANY FORM OF HEAT TO THE AREA.  9) Have prescriptions filled immediately and take as directed.  10) Drink lots of liquids, water and juice to stay hydrated.  11) CALL IMMEDIATELY IF:  *Bleeding continues until the following day of surgery  *Pain increases and/or does not respond to medication  *Bandages or cast appears to tight  *If your bandage gets wet  *Trip, fall or stump your surgical foot  *If your temperature goes above 101  *If you have ANY questions at all  12) You are expected to be weightbearing after your surgery.   If you need to reach the nurse for any reason, please call: [place]: [phone] [place]: [phone] [place]: [phone]    Post Anesthesia Home Care Instructions  Activity: Get plenty of rest for the remainder of the day. A responsible individual must stay with you for 24 hours following the procedure.  For the next 24 hours, DO NOT: -Drive a  car -Paediatric nurse -Drink alcoholic beverages -Take any medication unless instructed by your physician -Make any legal decisions or sign important papers.  Meals: Start with liquid foods such as gelatin or soup. Progress to regular foods as tolerated. Avoid greasy, spicy, heavy foods. If nausea and/or vomiting occur, drink only clear liquids until the nausea and/or vomiting subsides. Call your physician if vomiting continues.  Special Instructions/Symptoms: Your throat may feel dry or sore from the anesthesia or the breathing tube placed in your throat during surgery. If this causes discomfort, gargle with warm salt water. The discomfort should disappear within 24 hours.  If you had a scopolamine patch placed behind your ear for the management of post- operative nausea and/or vomiting:  1. The medication in the patch is effective for 72 hours, after which it should be removed.  Wrap patch in a tissue and discard in the trash. Wash hands thoroughly with soap and water. 2. You may remove the patch earlier than 72 hours if you experience unpleasant side effects which may include dry mouth, dizziness or visual disturbances. 3. Avoid touching the patch. Wash your hands with soap and water after contact with the patch.

## 2020-08-26 ENCOUNTER — Encounter (HOSPITAL_BASED_OUTPATIENT_CLINIC_OR_DEPARTMENT_OTHER): Payer: Self-pay | Admitting: Podiatry

## 2020-08-27 LAB — AEROBIC CULTURE

## 2020-08-27 LAB — SPECIMEN STATUS REPORT

## 2020-08-29 IMAGING — DX DG CHEST 2V
2 series · 2 of 2 positions shown · non-contrast
Comparison: Radiograph 03/07/2019, CT 06/26/2017

CLINICAL DATA: Cough, history of COPD

EXAM:
CHEST - 2 VIEW

[chest pa]
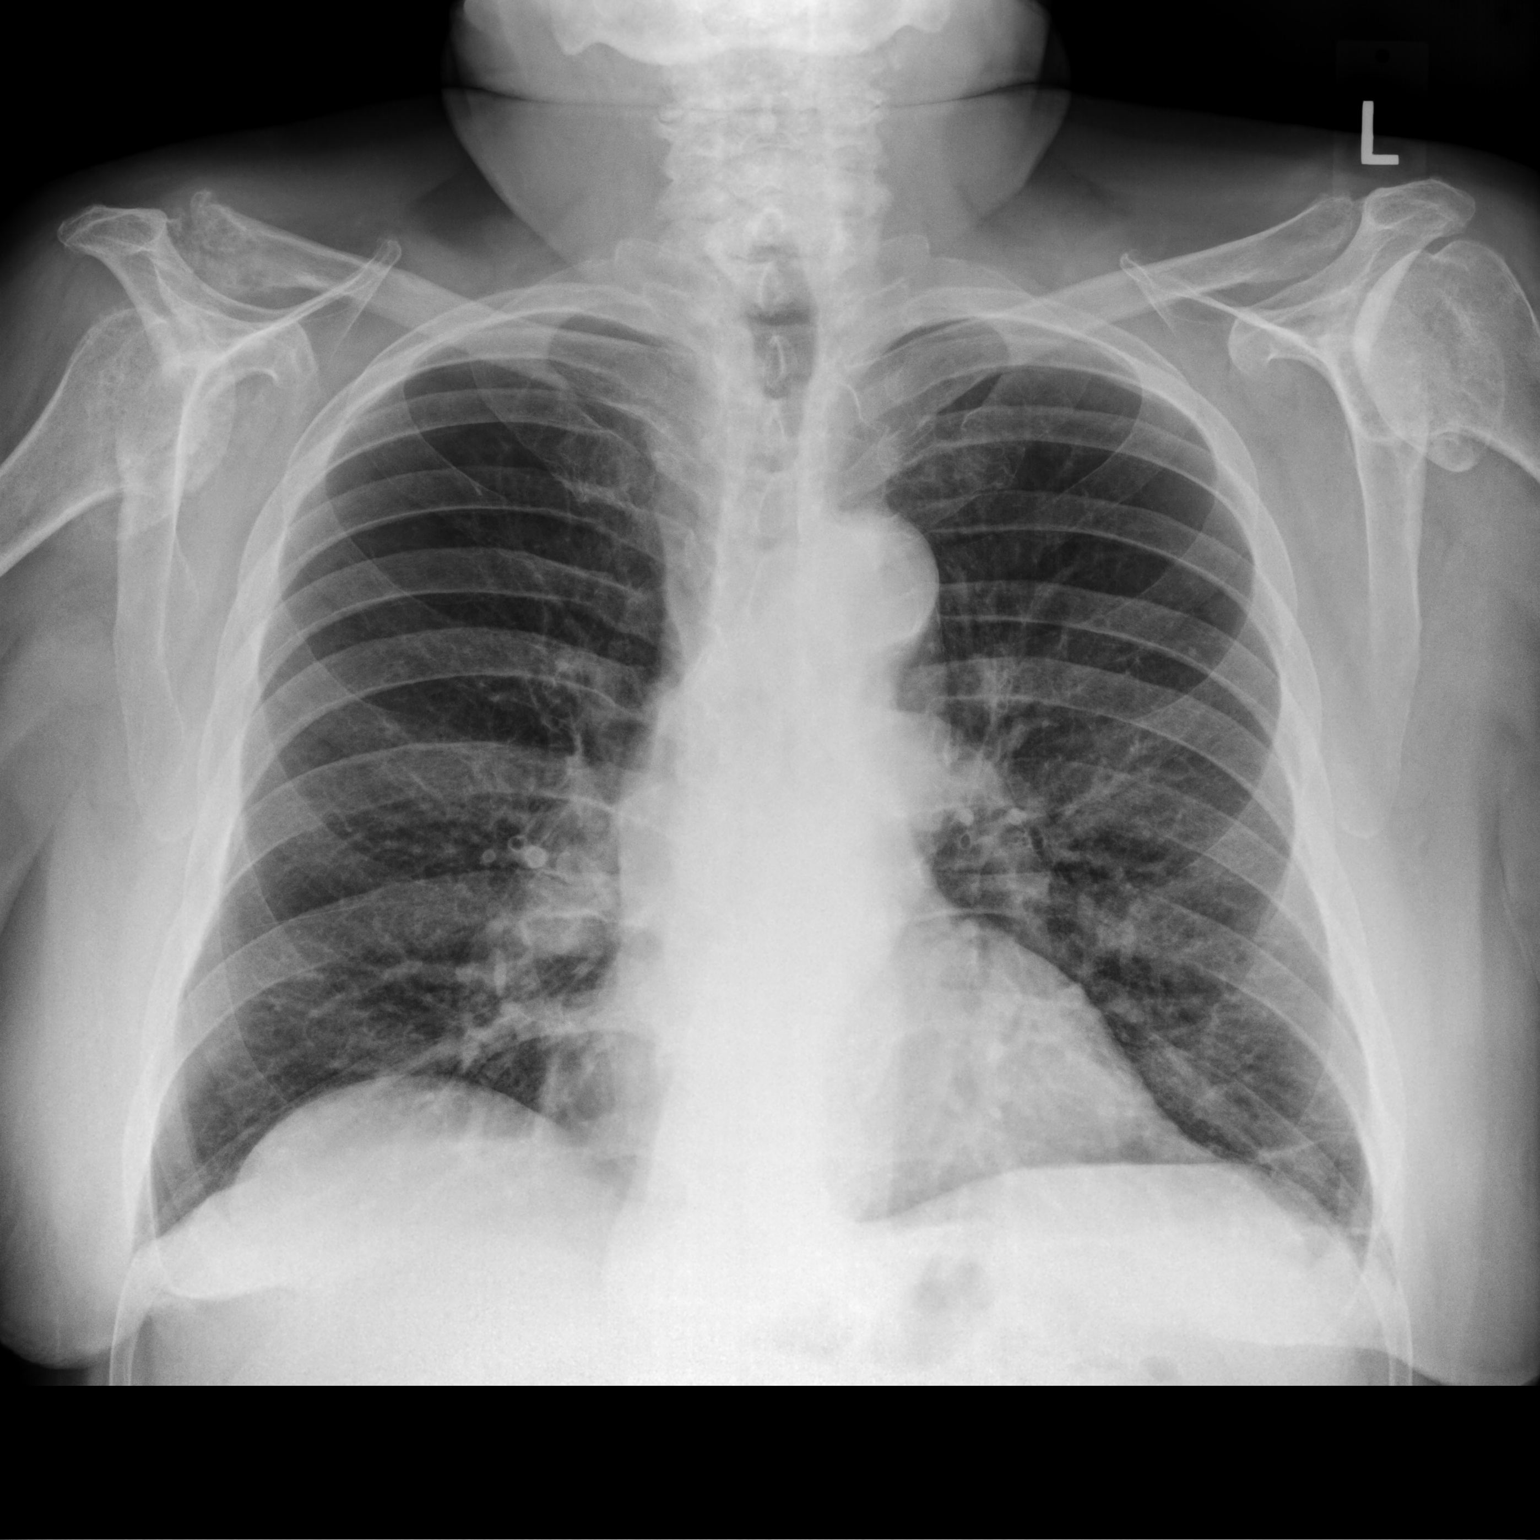

[chest lat]
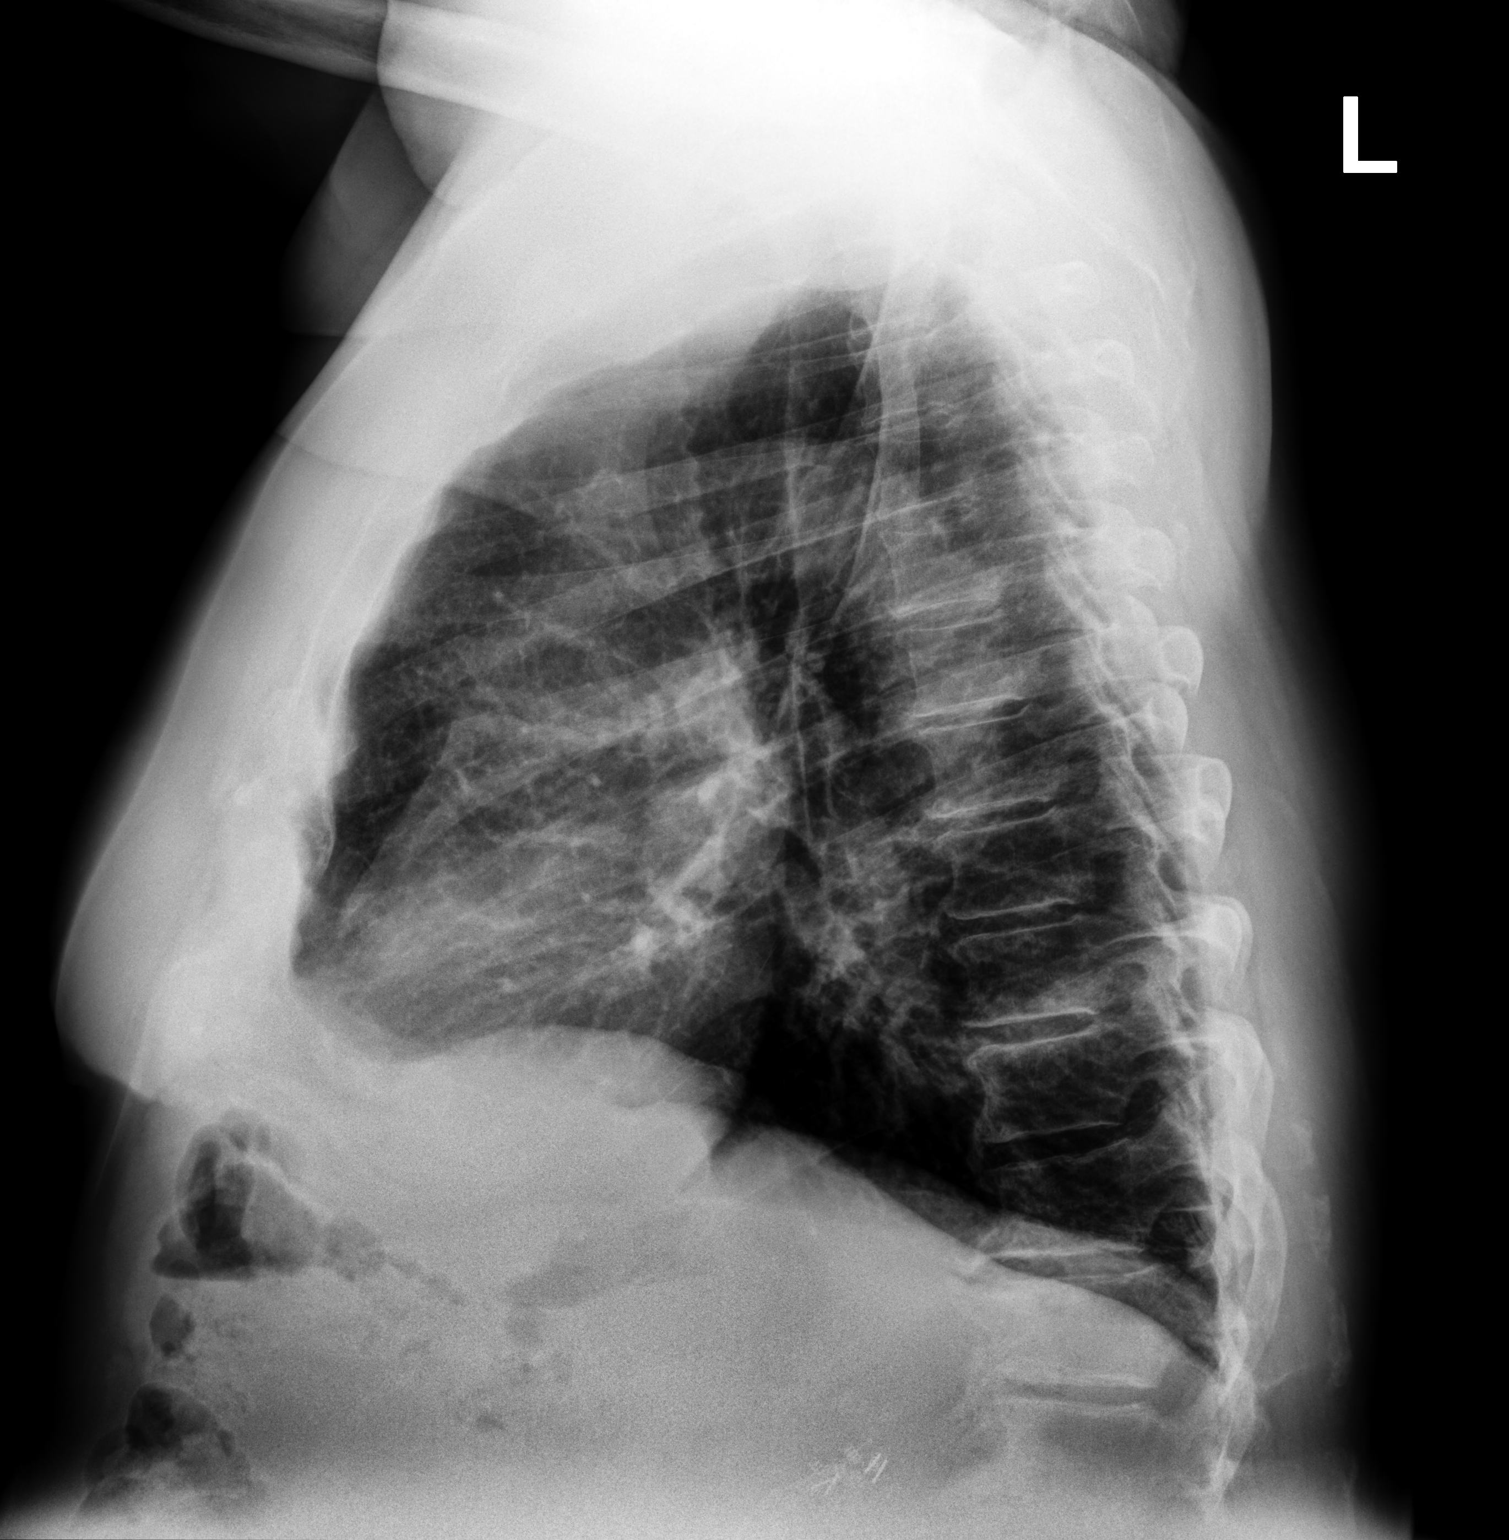

[2 of 2 positions shown; findings below may reference images not displayed]

FINDINGS: Lungs appear hyperinflated with coarsened interstitial changes
towards the bases and apical lucency as well as diaphragmatic
flattening compatible with emphysematous changes seen on prior CT
and overall similar in comparison to prior radiographs. No focal
consolidative process. The aorta is calcified. Central pulmonary
arterial enlargement suggesting pulmonary artery hypertension. The
remaining cardiomediastinal contours are unremarkable. Severe
degenerative changes noted in both shoulders. Multilevel
degenerative changes are present in the imaged portions of the
spine. Surgical clips in the upper abdomen.
IMPRESSION: 1. Findings consistent with emphysema/COPD, similar in comparison to
prior radiographs.
2. No acute cardiopulmonary disease.
3. Central pulmonary arterial enlargement suggesting pulmonary
artery hypertension.

## 2020-08-30 ENCOUNTER — Encounter (HOSPITAL_COMMUNITY): Admission: RE | Payer: Self-pay | Source: Home / Self Care

## 2020-08-30 ENCOUNTER — Ambulatory Visit (INDEPENDENT_AMBULATORY_CARE_PROVIDER_SITE_OTHER): Payer: Medicare Other | Admitting: Podiatry

## 2020-08-30 ENCOUNTER — Other Ambulatory Visit: Payer: Self-pay

## 2020-08-30 ENCOUNTER — Inpatient Hospital Stay (HOSPITAL_COMMUNITY): Admission: RE | Admit: 2020-08-30 | Payer: Medicare Other | Source: Home / Self Care | Admitting: Neurological Surgery

## 2020-08-30 DIAGNOSIS — L02416 Cutaneous abscess of left lower limb: Secondary | ICD-10-CM | POA: Diagnosis not present

## 2020-08-30 DIAGNOSIS — L97923 Non-pressure chronic ulcer of unspecified part of left lower leg with necrosis of muscle: Secondary | ICD-10-CM

## 2020-08-30 DIAGNOSIS — L03116 Cellulitis of left lower limb: Secondary | ICD-10-CM

## 2020-08-30 SURGERY — POSTERIOR LUMBAR FUSION 1 WITH HARDWARE REMOVAL
Anesthesia: General | Site: Back

## 2020-08-30 NOTE — Progress Notes (Signed)
  Subjective:  Patient ID: Kyle Lewis, male    DOB: [birth year],  MRN: [id number]  Chief Complaint  Patient presents with  . Routine Post Op    POV #1 -pt denies N/V/F/Ch -pt states," it'd doing okay, with a little pain; 2/10 occasional pain." - with little drainage (clear), redness and swelling per pt Tx: dry bandage   75 y.o. male presents with the above complaint. Hx confirmed with patient.  Objective:  Physical Exam: warm, good capillary refill, nail exam onychomycosis of the toenails, DP pulses palpable, PT pulses palpable and protective sensation intact  Left leg abscess site without warmth, erythema, signs of infection. Serous drainage. No purulence.  Assessment:   1. Cellulitis and abscess of left leg   2. Ulcer of left lower leg, with necrosis of muscle ([place])    Plan:  Patient was evaluated and treated and all questions answered.  Abscess left leg -Improving. No purulence expressible today. Decreased depth noted. Soft eschar overtop. -Wound cleansed. Dressed with silvadene and foam border dressing. Change daily. Ok to shower.  Return in about 2 weeks (around 09/13/2020) for abscess f/u.

## 2020-09-07 ENCOUNTER — Encounter: Payer: Self-pay | Admitting: Family Medicine

## 2020-09-07 ENCOUNTER — Ambulatory Visit (INDEPENDENT_AMBULATORY_CARE_PROVIDER_SITE_OTHER): Payer: Medicare Other | Admitting: Family Medicine

## 2020-09-07 ENCOUNTER — Other Ambulatory Visit: Payer: Self-pay

## 2020-09-07 VITALS — BP 118/72 | HR 58 | Temp 98.1°F | Resp 20 | Ht 67.0 in | Wt 224.6 lb

## 2020-09-07 DIAGNOSIS — I10 Essential (primary) hypertension: Secondary | ICD-10-CM

## 2020-09-07 DIAGNOSIS — N318 Other neuromuscular dysfunction of bladder: Secondary | ICD-10-CM

## 2020-09-07 DIAGNOSIS — G4733 Obstructive sleep apnea (adult) (pediatric): Secondary | ICD-10-CM | POA: Diagnosis not present

## 2020-09-07 NOTE — Patient Instructions (Signed)
Continue brain exercises, crooswords, etc
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
## 2020-09-13 ENCOUNTER — Ambulatory Visit (INDEPENDENT_AMBULATORY_CARE_PROVIDER_SITE_OTHER): Payer: Medicare Other | Admitting: Podiatry

## 2020-09-13 ENCOUNTER — Other Ambulatory Visit: Payer: Self-pay

## 2020-09-13 DIAGNOSIS — L97923 Non-pressure chronic ulcer of unspecified part of left lower leg with necrosis of muscle: Secondary | ICD-10-CM

## 2020-09-13 DIAGNOSIS — L02416 Cutaneous abscess of left lower limb: Secondary | ICD-10-CM | POA: Diagnosis not present

## 2020-09-13 DIAGNOSIS — L03116 Cellulitis of left lower limb: Secondary | ICD-10-CM

## 2020-09-13 NOTE — Progress Notes (Signed)
  Subjective:  Patient ID: Kyle Lewis, male    DOB: 27-Dec-1945,  MRN: [id number]  Chief Complaint  Patient presents with  . Routine Post Op    POV#2 -pt denies N/V/F/Ch -pt states," doing better, it's looking better." - pt denies pain/swelling -w/ less redness and drainage Tx: abx ointment and dressing change   75 y.o. male presents with the above complaint. Hx confirmed with patient.  Objective:  Physical Exam: warm, good capillary refill, nail exam onychomycosis of the toenails, DP pulses palpable, PT pulses palpable and protective sensation intact  Left leg abscess site without warmth, erythema, signs of infection. 1x0.5 x0.4. No deep probing or to bone.  Assessment:   1. Cellulitis and abscess of left leg   2. Ulcer of left lower leg, with necrosis of muscle ([place])    Plan:  Patient was evaluated and treated and all questions answered.  Abscess left leg -Continues to improve. Wound cleansed with saline and cotton tip applicator. No deep probing. No purulence. -Prisma packed into the wound and covered with band-aid. Patient to do so daily. Given supplies to do this. -F/u in 2 weeks.  Return in about 2 weeks (around 09/27/2020) for Wound Care.

## 2020-10-07 ENCOUNTER — Ambulatory Visit (INDEPENDENT_AMBULATORY_CARE_PROVIDER_SITE_OTHER): Payer: Medicare Other | Admitting: Podiatry

## 2020-10-07 ENCOUNTER — Other Ambulatory Visit: Payer: Self-pay

## 2020-10-07 ENCOUNTER — Encounter: Payer: Self-pay | Admitting: Podiatry

## 2020-10-07 DIAGNOSIS — L97923 Non-pressure chronic ulcer of unspecified part of left lower leg with necrosis of muscle: Secondary | ICD-10-CM | POA: Diagnosis not present

## 2020-10-07 NOTE — Progress Notes (Signed)
  Subjective:  Patient ID: LAWRNCE REYEZ, male    DOB: Oct 07, 1945,  MRN: [id number]  Chief Complaint  Patient presents with  . Foot Ulcer    I am doing better on the left leg and there is little draining    75 y.o. male presents for wound care. Hx confirmed with patient.  Objective:  Physical Exam: Wound Location: left leg Wound Measurement: 1x0.5 Wound Base: Granular/Healthy Peri-wound: Normal Exudate: Scant/small amount Serosanguinous exudate undermining noted 12 to 6 o'clock  No images are attached to the encounter. Assessment:   1. Ulcer of left lower leg, with necrosis of muscle ([place])    Plan:  Patient was evaluated and treated and all questions answered.  Ulcer left leg -Wound cleansed and debrided -Puraply graft applied to the wound. Lot [id number] exp 11/21/22. Hydrated with saline, covered with Mepitel, followed by foam border dressing. -To be left intact x1 week.  No follow-ups on file.

## 2020-10-14 ENCOUNTER — Other Ambulatory Visit: Payer: Self-pay

## 2020-10-14 ENCOUNTER — Encounter: Payer: Self-pay | Admitting: Podiatry

## 2020-10-14 ENCOUNTER — Ambulatory Visit (INDEPENDENT_AMBULATORY_CARE_PROVIDER_SITE_OTHER): Payer: Medicare Other | Admitting: Podiatry

## 2020-10-14 DIAGNOSIS — L97923 Non-pressure chronic ulcer of unspecified part of left lower leg with necrosis of muscle: Secondary | ICD-10-CM | POA: Diagnosis not present

## 2020-10-14 NOTE — Progress Notes (Signed)
  Subjective:  Patient ID: Kyle Lewis, male    DOB: 1945/06/17,  MRN: [id number]  Chief Complaint  Patient presents with  . Foot Problem    The left leg is doing better and there is not any draining unless I mash it   75 y.o. male presents for wound care. Hx confirmed with patient.  Objective:  Physical Exam: Wound Location: left leg Wound Measurement: 0.6x0.5 Wound Base: Granular/Healthy Peri-wound: Normal Exudate: Scant/small amount Serosanguinous exudate undermining noted 12 to 6 o'clock  No images are attached to the encounter. Assessment:   1. Ulcer of left lower leg, with necrosis of muscle ([place])    Plan:  Patient was evaluated and treated and all questions answered.  Ulcer left leg -Wound cleansed and minimally debrided -Puraply packed into the wound. Lot [id number] exp 11/21/22. Dressed with mepilex border dressing. Patient to keep intact x1 week  Return in about 1 week (around 10/21/2020) for Wound Care possible graft application.

## 2020-10-21 ENCOUNTER — Ambulatory Visit (INDEPENDENT_AMBULATORY_CARE_PROVIDER_SITE_OTHER): Payer: Medicare Other | Admitting: Podiatry

## 2020-10-21 ENCOUNTER — Other Ambulatory Visit: Payer: Self-pay

## 2020-10-21 ENCOUNTER — Encounter: Payer: Self-pay | Admitting: Podiatry

## 2020-10-21 DIAGNOSIS — L97923 Non-pressure chronic ulcer of unspecified part of left lower leg with necrosis of muscle: Secondary | ICD-10-CM | POA: Diagnosis not present

## 2020-10-21 NOTE — Progress Notes (Signed)
  Subjective:  Patient ID: Kyle Lewis, male    DOB: [birth year],  MRN: [id number]  Chief Complaint  Patient presents with  . Foot Ulcer    The left leg is healing up and I think he is using a graft application    75 y.o. male presents for wound care. Hx confirmed with patient. Thinks the wound is doing ok. Objective:  Physical Exam: Wound Location: left leg Wound Measurement: 0.4x0.4 Wound Base: Granular/Healthy Peri-wound: Normal Exudate: Scant/small amount Serosanguinous exudate undermining noted 12 to 6 o'clock  No images are attached to the encounter. Assessment:   1. Ulcer of left lower leg, with necrosis of muscle ([place])    Plan:  Patient was evaluated and treated and all questions answered.  Ulcer left leg -Improving -Wound cleansed, minimally mechanically debrided -Puraply graft [id number] exp 11/30/22 applied and packed into wound. Dressed with foam border dressing. Keep intact x1 week.  Return in about 1 week (around 10/28/2020) for Wound care, possible graft application.

## 2020-10-28 ENCOUNTER — Other Ambulatory Visit: Payer: Self-pay

## 2020-10-28 ENCOUNTER — Ambulatory Visit (INDEPENDENT_AMBULATORY_CARE_PROVIDER_SITE_OTHER): Payer: Medicare Other | Admitting: Podiatry

## 2020-10-28 ENCOUNTER — Encounter: Payer: Self-pay | Admitting: Podiatry

## 2020-10-28 DIAGNOSIS — L97923 Non-pressure chronic ulcer of unspecified part of left lower leg with necrosis of muscle: Secondary | ICD-10-CM | POA: Diagnosis not present

## 2020-10-28 NOTE — Progress Notes (Signed)
  Subjective:  Patient ID: Kyle Lewis, male    DOB: 1945-12-15,  MRN: [id number]  Chief Complaint  Patient presents with  . Foot Ulcer    I have been doing ok on the left leg    75 y.o. male presents for wound care. Hx confirmed with patient. Thinks the wound is doing better. Denies new issues with is feet. Objective:  Physical Exam: Wound Location: left leg Wound Measurement: 0.3x0.3x1.5 Wound Base: Granular/Healthy Peri-wound: Normal Exudate: Scant/small amount Serosanguinous exudate undermining noted 12 to 6 o'clock  No images are attached to the encounter. Assessment:   1. Ulcer of left lower leg, with necrosis of muscle ([place])    Plan:  Patient was evaluated and treated and all questions answered.  Ulcer left leg -Improving -Wound cleansed and flushed. -Puraply graft [id number] exp 11/30/22 packed into wound and hydrated with saline, dressed with mepitel, foam border dressing.  Return in about 1 week (around 11/04/2020) for Wound Care.

## 2020-11-04 ENCOUNTER — Other Ambulatory Visit: Payer: Self-pay

## 2020-11-04 ENCOUNTER — Encounter: Payer: Self-pay | Admitting: Podiatry

## 2020-11-04 ENCOUNTER — Ambulatory Visit (INDEPENDENT_AMBULATORY_CARE_PROVIDER_SITE_OTHER): Payer: Medicare Other | Admitting: Podiatry

## 2020-11-04 DIAGNOSIS — L97923 Non-pressure chronic ulcer of unspecified part of left lower leg with necrosis of muscle: Secondary | ICD-10-CM

## 2020-11-04 NOTE — Progress Notes (Signed)
  Subjective:  Patient ID: Kyle Lewis, male    DOB: 04/10/1946,  MRN: [id number]  Chief Complaint  Patient presents with  . Foot Ulcer    Doing ok on the left leg   75 y.o. male presents for wound care. Hx confirmed with patient. Thinks the wound is doing better. Objective:  Physical Exam: Wound Location: left leg Wound Measurement: 0.5x0.5x1 Wound Base: Granular/Healthy Peri-wound: Normal Exudate: Scant/small amount Serosanguinous exudate undermining noted 12 to 3 o'clock  No images are attached to the encounter. Assessment:   1. Ulcer of left lower leg, with necrosis of muscle ([place])    Plan:  Patient was evaluated and treated and all questions answered.  Ulcer left leg -Improving -Wound cleansed and flushed. -#2 Affinity grafts each 2.5x2.5 applied. One packed deep into wound. The other placed on the wound bed itself. Graft ID [id number] and [id number].  Return in about 1 week (around 11/11/2020) for Wound Care, possible graft application.

## 2020-11-11 ENCOUNTER — Ambulatory Visit (INDEPENDENT_AMBULATORY_CARE_PROVIDER_SITE_OTHER): Payer: Medicare Other | Admitting: Podiatry

## 2020-11-11 ENCOUNTER — Other Ambulatory Visit: Payer: Self-pay

## 2020-11-11 ENCOUNTER — Encounter: Payer: Self-pay | Admitting: Podiatry

## 2020-11-11 DIAGNOSIS — L97923 Non-pressure chronic ulcer of unspecified part of left lower leg with necrosis of muscle: Secondary | ICD-10-CM | POA: Diagnosis not present

## 2020-11-11 NOTE — Progress Notes (Signed)
  Subjective:  Patient ID: Kyle Lewis, male    DOB: 1945/11/12,  MRN: [id number]  Chief Complaint  Patient presents with   Foot Ulcer    Doing ok on the left leg    75 y.o. male presents for wound care. Hx confirmed with patient. Has kept dressing intact as directed.  Objective:  Physical Exam: Wound Location: left leg Wound Measurement: 0.5x0.5x0.5 Wound Base: Granular/Healthy Peri-wound: Normal Exudate: Scant/small amount Serosanguinous exudate No undermining noted.  No images are attached to the encounter. Assessment:   1. Ulcer of left lower leg, with necrosis of muscle ([place])    Plan:  Patient was evaluated and treated and all questions answered.  Ulcer left leg -Continues to improve. No longer undermining. -Wound cleansed and debrided.  -Affinity graft applied to the wound base. ID# [id number] exp 11/12/20. Covered with Mepitel and Foam border dressing. Leave intact x1 week  No follow-ups on file.

## 2020-11-18 ENCOUNTER — Ambulatory Visit: Payer: Medicare Other | Admitting: Podiatry

## 2020-11-19 ENCOUNTER — Ambulatory Visit: Payer: Medicare Other | Admitting: Orthopedic Surgery

## 2020-11-25 ENCOUNTER — Encounter: Payer: Self-pay | Admitting: Podiatry

## 2020-11-25 ENCOUNTER — Ambulatory Visit (INDEPENDENT_AMBULATORY_CARE_PROVIDER_SITE_OTHER): Payer: Medicare Other | Admitting: Podiatry

## 2020-11-25 ENCOUNTER — Other Ambulatory Visit: Payer: Self-pay

## 2020-11-25 DIAGNOSIS — L97923 Non-pressure chronic ulcer of unspecified part of left lower leg with necrosis of muscle: Secondary | ICD-10-CM | POA: Diagnosis not present

## 2020-11-25 NOTE — Progress Notes (Signed)
  Subjective:  Patient ID: Kyle Lewis, male    DOB: 10-22-1945,  MRN: [id number]  Chief Complaint  Patient presents with   Foot Ulcer    I am doing a lot better on the left leg and is almost healed   75 y.o. male presents for wound care. Hx confirmed with patient. Has kept dressing intact as directed.  Objective:  Physical Exam: Wound Location: left leg Wound Measurement: 0.2x0.3 Wound Base: Granular/Healthy Peri-wound: Normal Exudate: Scant/small amount Serosanguinous exudate No undermining noted.  No images are attached to the encounter. Assessment:   1. Ulcer of left lower leg, with necrosis of muscle ([place])    Plan:  Patient was evaluated and treated and all questions answered.  Ulcer left leg -Again without tunnneling or undermining. Minimal debridement, cleansed.  -[employer] affinity graft applied. ID# [id number] exp 11/26/20 applied. Covered with mepitel and foam border dressing.  Return in about 2 weeks (around 12/09/2020) for Wound Care.

## 2020-11-30 ENCOUNTER — Ambulatory Visit: Payer: Medicare Other | Admitting: Adult Health

## 2020-12-09 ENCOUNTER — Encounter: Payer: Self-pay | Admitting: Podiatry

## 2020-12-09 ENCOUNTER — Ambulatory Visit (INDEPENDENT_AMBULATORY_CARE_PROVIDER_SITE_OTHER): Payer: Medicare Other | Admitting: Podiatry

## 2020-12-09 DIAGNOSIS — L03116 Cellulitis of left lower limb: Secondary | ICD-10-CM | POA: Diagnosis not present

## 2020-12-09 DIAGNOSIS — L97923 Non-pressure chronic ulcer of unspecified part of left lower leg with necrosis of muscle: Secondary | ICD-10-CM | POA: Diagnosis not present

## 2020-12-09 DIAGNOSIS — L02416 Cutaneous abscess of left lower limb: Secondary | ICD-10-CM

## 2020-12-09 DIAGNOSIS — I739 Peripheral vascular disease, unspecified: Secondary | ICD-10-CM

## 2020-12-09 NOTE — Progress Notes (Signed)
  Subjective:  Patient ID: Kyle Lewis, male    DOB: 24-Feb-1946,  MRN: [id number]  Chief Complaint  Patient presents with   Foot Ulcer    The left leg is doing good and all healed up    75 y.o. male presents for wound care. Hx confirmed with patient. Thinks the wound is all healed and is pleased with results. Objective:  Physical Exam: Wound Location: left leg Wound Measurement: healed Wound Base: epithelialized Peri-wound: Normal Exudate: None   No images are attached to the encounter. Assessment:   1. Ulcer of left lower leg, with necrosis of muscle ([place])   2. Cellulitis and abscess of left leg   3. Peripheral vascular disease ([place])     Plan:  Patient was evaluated and treated and all questions answered.  Ulcer left leg -Wound appears fully healed. No debridement today. -F/u in 3 months for at risk foot care.  Return in about 3 months (around 03/11/2021) for Diabetic Foot Care.

## 2020-12-20 ENCOUNTER — Other Ambulatory Visit: Payer: Self-pay | Admitting: *Deleted

## 2020-12-20 MED ORDER — CLOPIDOGREL BISULFATE 75 MG PO TABS: 75.0000 mg | ORAL_TABLET | Freq: Every day | ORAL | 1 refills | Status: DC

## 2020-12-20 MED ORDER — TAMSULOSIN HCL 0.4 MG PO CAPS: 0.4000 mg | ORAL_CAPSULE | Freq: Every day | ORAL | 1 refills | Status: DC

## 2020-12-20 NOTE — Addendum Note (Signed)
Addended by: Rafael Bihari A on: 12/20/2020 03:31 PM   Modules accepted: Orders

## 2020-12-20 NOTE — Telephone Encounter (Signed)
[place] request refill.

## 2020-12-21 ENCOUNTER — Other Ambulatory Visit: Payer: Self-pay | Admitting: *Deleted

## 2020-12-21 DIAGNOSIS — G2581 Restless legs syndrome: Secondary | ICD-10-CM

## 2020-12-21 MED ORDER — GABAPENTIN 300 MG PO CAPS: 300.0000 mg | ORAL_CAPSULE | Freq: Every day | ORAL | 1 refills | Status: DC

## 2020-12-21 NOTE — Telephone Encounter (Signed)
Patient requested refill

## 2021-01-05 ENCOUNTER — Encounter: Payer: Self-pay | Admitting: Podiatry

## 2021-01-11 ENCOUNTER — Encounter: Payer: Self-pay | Admitting: Family Medicine

## 2021-01-11 ENCOUNTER — Ambulatory Visit (INDEPENDENT_AMBULATORY_CARE_PROVIDER_SITE_OTHER): Payer: Medicare Other | Admitting: Family Medicine

## 2021-01-11 ENCOUNTER — Other Ambulatory Visit: Payer: Self-pay

## 2021-01-11 VITALS — BP 132/78 | HR 68 | Temp 97.7°F | Ht 66.0 in | Wt 224.0 lb

## 2021-01-11 DIAGNOSIS — I350 Nonrheumatic aortic (valve) stenosis: Secondary | ICD-10-CM | POA: Diagnosis not present

## 2021-01-11 DIAGNOSIS — M170 Bilateral primary osteoarthritis of knee: Secondary | ICD-10-CM | POA: Diagnosis not present

## 2021-01-11 DIAGNOSIS — I10 Essential (primary) hypertension: Secondary | ICD-10-CM

## 2021-01-11 NOTE — Progress Notes (Signed)
Provider:  [name], MD  Careteam: Patient Care Team: [name], MD as PCP - General (Family Medicine) Revankar, Reita Cliche, MD as PCP - Cardiology (Cardiology) Druscilla Brownie, MD as Consulting Physician (Dermatology) Garvin Fila, MD as Consulting Physician (Neurology)  PLACE OF SERVICE:  [place]  Advanced Directive information    Allergies  Allergen Reactions   Cefazolin Rash    The patient had surgery and was given cefazolin intraop. ~ 10 days later he developed a rash confirmed by biopsy to be consistent w/ drug eruption. We cannot know for sure, but this is the most likely agent.      Chief Complaint  Patient presents with   Medical Management of Chronic Issues    Patient presents today for 4 month follow-up.     HPI: Patient is a 75 y.o. male patient is here for routine blood pressure follow-up.  He has also noted some hearing loss and feels like he might need his ears irrigated. Other problems include enlarged prostate for which he takes tamsulosin and finasteride. At his last visit we talked about memory issues and he performed well on recent recall with 3 words at 5 minutes and what he ate for supper the previous day.  He was accompanied by his wife that day.  She is not here today to contribute to the history.  Review of Systems:  Review of Systems  HENT:  Positive for hearing loss.   Respiratory: Negative.    Cardiovascular: Negative.   Genitourinary:  Positive for frequency.  All other systems reviewed and are negative.  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   Abscess of left leg    Adenomatous colon polyp    Adrenal mass (HCC)    2 benign appearing left adrenal adenomas noted on 01/13/15 CT   Allergy    seasonal   Aortic stenosis    mild to moderate by echo and cath in 09/2016   Arthritis    Carotid artery occlusion    Cataract    Chronic back  pain    COPD (chronic obstructive pulmonary disease) (HCC)    Coronary artery disease    CVA (cerebral infarction)    Degeneration of cervical intervertebral disc    Diverticulosis    History of back surgery    Rods and Screws in back   History of left knee replacement    Hypertension    Meningioma (HCC)    Nocturia    Peripheral vascular disease ([place])    Ringing in ear    (SLIGHT)   Sleep apnea    wears BIPAP set on 5 to 9   Stroke ([place]) 2013   tia no residual deficit from   Past Surgical History:  Procedure Laterality Date   ABDOMINAL AORTIC ANEURYSM REPAIR  2019   [place]  as child   BACK SURGERY  2018   Rods and Screws in Back lower back   BRAIN MENINGIOMA EXCISION  1991   menigioma   CARPAL TUNNEL RELEASE     left   EYE SURGERY Bilateral    Cataract   IRRIGATION AND DEBRIDEMENT ABSCESS Left 08/25/2020   Procedure: IRRIGATION AND DEBRIDEMENT ABSCESS LEFT LEG;  Surgeon: Evelina Bucy, DPM;  Location: [facility];  Service: Podiatry;  Laterality: Left;   JOINT REPLACEMENT Left 04-01-12   Knee  LEFT HEART CATH AND CORONARY ANGIOGRAPHY N/A 09/15/2016   Procedure: Left Heart Cath and Coronary Angiography;  Surgeon: Nelva Bush, MD;  Location: [facility];  Service: Cardiovascular;  Laterality: N/A;   MAXIMUM ACCESS (MAS)POSTERIOR LUMBAR INTERBODY FUSION (PLIF) 1 LEVEL N/A 07/07/2015   Procedure:  POSTERIOR LUMBAR INTERBODY FUSION (PLIF) Lumbar Four-Five with Pedicle Screw Fixation Lumbar Two-Five;Laminectomy Lumbar Two-Five;  Surgeon: Eustace Moore, MD;  Location: [facility];  Service: Neurosurgery;  Laterality: N/A;   POSTERIOR LUMBAR INTERBODY FUSION (PLIF) Lumbar Four-Five with Pedicle Screw Fixation Lumbar Two-Five;Laminectomy Lumbar Two-Five   RIGHT/LEFT HEART CATH AND CORONARY ANGIOGRAPHY N/A 01/24/2018   Procedure: RIGHT/LEFT HEART CATH AND CORONARY ANGIOGRAPHY;  Surgeon: Nelva Bush, MD;  Location: [facility];   Service: Cardiovascular;  Laterality: N/A;   TONSILLECTOMY  as child   TOTAL KNEE ARTHROPLASTY  04/01/2012   Procedure: TOTAL KNEE ARTHROPLASTY;  Surgeon: Gearlean Alf, MD;  Location: [facility];  Service: Orthopedics;  Laterality: Left;   TRANSURETHRAL RESECTION OF PROSTATE     UMBILICAL HERNIA REPAIR  2011   Social History:   reports that he quit smoking about 17 months ago. His smoking use included cigarettes. He has a 30.00 pack-year smoking history. He has never used smokeless tobacco. He reports current alcohol use. He reports that he does not use drugs.  Family History  Problem Relation Age of Onset   Heart disease Mother        Onset ~106 y/o   Hypertension Mother        Deceased from old age at 12   Hyperlipidemia Mother    Arthritis Mother    Diabetes Father        Deceased from old age at 49   Heart attack Father    Heart disease Father        CABG at age 57   Dementia Father 45   Arthritis Father    Prostate cancer Maternal Grandfather    Colon cancer Neg Hx    Esophageal cancer Neg Hx    Rectal cancer Neg Hx    Stomach cancer Neg Hx     Medications: Patient's Medications  New Prescriptions   No medications on file  Previous Medications   ACETAMINOPHEN (TYLENOL) 500 MG TABLET    Take 500 mg by mouth at bedtime.    ALBUTEROL (VENTOLIN HFA) 108 (90 BASE) MCG/ACT INHALER    Inhale 2 puffs into the lungs every 6 (six) hours as needed for wheezing or shortness of breath.   AZO-CRANBERRY PO    Take 2 tablets by mouth at bedtime.   B COMPLEX VITAMINS CAPSULE    Take 1 capsule by mouth daily.   CHOLECALCIFEROL (D-3-5) 125 MCG (5000 UT) CAPSULE    Take 5,000 Units by mouth daily.   CLOPIDOGREL (PLAVIX) 75 MG TABLET    Take 1 tablet (75 mg total) by mouth daily.   FINASTERIDE (PROSCAR) 5 MG TABLET    Take 1 tablet (5 mg total) by mouth daily.   GABAPENTIN (NEURONTIN) 300 MG CAPSULE    Take 1 capsule (300 mg total) by mouth at bedtime.   LOPERAMIDE (IMODIUM) 2 MG CAPSULE     Take 2 mg by mouth as needed.    METHOCARBAMOL (ROBAXIN) 500 MG TABLET    Take 1 tablet (500 mg total) by mouth at bedtime as needed for muscle spasms.   MIRABEGRON ER (MYRBETRIQ) 25 MG TB24 TABLET    Take 25 mg by  mouth daily.   OLMESARTAN (BENICAR) 40 MG TABLET    Take 1 tablet (40 mg total) by mouth daily.   SILVER SULFADIAZINE (SILVADENE) 1 % CREAM    Apply pea-sized amount to wound daily.   TAMSULOSIN (FLOMAX) 0.4 MG CAPS CAPSULE    Take 1 capsule (0.4 mg total) by mouth daily after supper.  Modified Medications   No medications on file  Discontinued Medications   AMLODIPINE (NORVASC) 5 MG TABLET    daily.   CIPROFLOXACIN (CIPRO) 500 MG TABLET    Take 1 tablet (500 mg total) by mouth every 12 (twelve) hours.   LEVOCETIRIZINE DIHYDROCHLORIDE (XYZAL ALLERGY 24HR PO)    Take 2.5 mg by mouth at bedtime. For Allergies    Physical Exam:  Vitals:   01/11/21 1420  BP: 132/78  Pulse: 68  Temp: 97.7 F (36.5 C)  TempSrc: Temporal  SpO2: 96%  Weight: 224 lb (101.6 kg)  Height: '5\' 6"'$  (1.676 m)   Body mass index is 36.15 kg/m. Wt Readings from Last 3 Encounters:  01/11/21 224 lb (101.6 kg)  09/07/20 224 lb 9.6 oz (101.9 kg)  08/25/20 222 lb 9.6 oz (101 kg)    Physical Exam Vitals and nursing note reviewed.  Constitutional:      Appearance: Normal appearance. He is obese.  HENT:     Ears:     Comments: Both external auditory canals are full of cerumen and will require irrigation Cardiovascular:     Rate and Rhythm: Normal rate and regular rhythm.     Heart sounds: Murmur heard.  Pulmonary:     Effort: Pulmonary effort is normal.     Breath sounds: Wheezing present.  Neurological:     General: No focal deficit present.     Mental Status: He is alert and oriented to person, place, and time.     Gait: Gait abnormal.     Comments: Uses cane to help with ambulation    Labs reviewed: Basic Metabolic Panel: Recent Labs    04/08/20 1506 08/13/20 1348 08/25/20 1036  NA  141 140 142  K 4.1 4.4 4.1  CL 104 107 101  CO2 27 28  --   GLUCOSE 104* 115* 115*  BUN '16 18 23  '$ CREATININE 0.72 0.69 0.70  CALCIUM 9.1 8.9  --   TSH 1.54  --   --    Liver Function Tests: Recent Labs    04/08/20 1506 08/13/20 1348  AST 16 16  ALT 16 17  ALKPHOS  --  66  BILITOT 0.6 1.2  PROT 6.4 6.4*  ALBUMIN  --  3.8   No results for input(s): LIPASE, AMYLASE in the last 8760 hours. No results for input(s): AMMONIA in the last 8760 hours. CBC: Recent Labs    04/08/20 1506 08/13/20 1348 08/25/20 1036  WBC 6.9 8.7  --   NEUTROABS 4,954 6.9  --   HGB 16.5 14.7 15.3  HCT 47.8 43.9 45.0  MCV 92.6 95.6  --   PLT 216 195  --    Lipid Panel: Recent Labs    04/08/20 1506  CHOL 161  HDL 49  LDLCALC 95  TRIG 81  CHOLHDL 3.3   TSH: Recent Labs    04/08/20 1506  TSH 1.54   A1C: Lab Results  Component Value Date   HGBA1C 5.5 09/25/2019     Assessment/Plan There are no diagnoses linked to this encounter.  1. Primary osteoarthritis of both knees Patient walks with pain and  cane.  2. Moderate aortic stenosis No evidence of CHF.  Murmur still present.  Denies syncope or shortness of breath  3. Primary hypertension Blood pressure is well controlled at 132/78 on olmesartan.   [name], MD [facility] [phone]

## 2021-01-12 ENCOUNTER — Ambulatory Visit: Payer: Medicare Other | Admitting: Neurology

## 2021-01-25 ENCOUNTER — Telehealth: Payer: Self-pay

## 2021-01-25 NOTE — Telephone Encounter (Signed)
Incoming call received from patient requesting samples for myrbetriq.  2 weeks of samples was provided to the patient and I offered to send in a rx to his pharmacy. Patient states " I can not afford the rx, it is about 700 dollars for a 30 day supply." I asked patient if he has ever looked into a patient assistance program and patient replied no.  I googled the [employer] Patient [employer], printed off the contact information for patient to call and see if he qualifies for assistance. Papers were attached to sample bag and placed at the front desk for pick-up

## 2021-01-27 ENCOUNTER — Other Ambulatory Visit: Payer: Self-pay | Admitting: *Deleted

## 2021-01-27 MED ORDER — OLMESARTAN MEDOXOMIL 40 MG PO TABS: 40.0000 mg | ORAL_TABLET | Freq: Every day | ORAL | 1 refills | Status: DC

## 2021-01-27 NOTE — Telephone Encounter (Signed)
Patient wife requested refill.

## 2021-03-01 ENCOUNTER — Telehealth: Payer: Self-pay | Admitting: *Deleted

## 2021-03-01 NOTE — Telephone Encounter (Signed)
Patient wife called requesting samples of Myrebetriq. Samples left up front for pick up.   Asked wife if they filled out the patient assistance forms that [place] printed off last time and she stated that they make too much money to qualify and the medication is $700/month.

## 2021-03-14 ENCOUNTER — Ambulatory Visit: Payer: Medicare Other | Admitting: Podiatry

## 2021-03-24 ENCOUNTER — Other Ambulatory Visit: Payer: Self-pay

## 2021-03-24 ENCOUNTER — Ambulatory Visit (INDEPENDENT_AMBULATORY_CARE_PROVIDER_SITE_OTHER): Payer: Medicare Other | Admitting: Podiatry

## 2021-03-24 ENCOUNTER — Encounter: Payer: Self-pay | Admitting: Podiatry

## 2021-03-24 DIAGNOSIS — D689 Coagulation defect, unspecified: Secondary | ICD-10-CM

## 2021-03-24 DIAGNOSIS — B351 Tinea unguium: Secondary | ICD-10-CM

## 2021-03-24 NOTE — Progress Notes (Signed)
  Subjective:  Patient ID: EDRAS WILFORD, male    DOB: 1946-04-20,  MRN: [id number]  Chief Complaint  Patient presents with   Nail Problem    Trim nails     75 y.o. male presents with the above complaint. History confirmed with patient. Denies new issues with his feet. Left leg wound has remained healed.  Objective:  Physical Exam: warm, good capillary refill, nail exam onychomycosis of the toenails, no trophic changes or ulcerative lesions. DP pulses palpable, PT pulses palpable, and protective sensation intact   No images are attached to the encounter.  Assessment:   1. 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  Onychomycosis and Coagulation Defect -Nails palliatively debrided secondary to pain and coag defect  Procedure: Nail Debridement Type of Debridement: manual, sharp debridement. Instrumentation: Nail nipper, rotary burr. Number of Nails: 10  No follow-ups on file.

## 2021-04-21 ENCOUNTER — Telehealth: Payer: Self-pay

## 2021-04-21 NOTE — Telephone Encounter (Signed)
Message left on clinical intake voicemail:   Incoming voicemail received from patient requesting samples for Myrbetriq.   Side note: Patient request myrbetriq medication samples frequently.  I returned called to patients wife and asked if patient applied for patient assistance, as we may not always have samples to supply. Mrs.Oelke states if you make 2 dollars more than the requirement, you will not qualify and she has tried patient assistance several times.  I then posed the question what happens when they request samples and we do not have any samples and Mrs.Eichhorst stated patient will be off medication until samples become available. Mrs.Suess also states patient has tried several alternatives and this is the only medication that works.    4 weeks of samples placed at the front desk for pick-up.   I informed Mrs.Garris that I will send this information to [place] as a [place]

## 2021-05-02 ENCOUNTER — Telehealth: Payer: Self-pay

## 2021-05-02 NOTE — Telephone Encounter (Signed)
Can use mucinex by mouth twice daily for chest congestion. It would be beneficial to talk to him if he wants further recommendations

## 2021-05-02 NOTE — Telephone Encounter (Signed)
Patient called requesting antibiotic for cough and chest congestion x 1 week. Patient took at home covid test and it was negative. I offered patient appointment and he refused due to lack of transportation. I offered video visit and patient also refused. Patient is asking for recommendations to treat smyptoms  Patient denies fever or other any other symptoms.   Please advise

## 2021-05-03 NOTE — Telephone Encounter (Signed)
Call returned to patient and/or family member, a detailed message was left with the providers reply.   

## 2021-05-05 ENCOUNTER — Ambulatory Visit (INDEPENDENT_AMBULATORY_CARE_PROVIDER_SITE_OTHER): Payer: Medicare Other | Admitting: Orthopedic Surgery

## 2021-05-05 ENCOUNTER — Encounter: Payer: Self-pay | Admitting: Orthopedic Surgery

## 2021-05-05 ENCOUNTER — Other Ambulatory Visit: Payer: Self-pay

## 2021-05-05 VITALS — BP 138/90 | HR 72 | Temp 96.8°F | Resp 18 | Ht 66.0 in | Wt 222.6 lb

## 2021-05-05 DIAGNOSIS — M5442 Lumbago with sciatica, left side: Secondary | ICD-10-CM | POA: Diagnosis not present

## 2021-05-05 DIAGNOSIS — N138 Other obstructive and reflux uropathy: Secondary | ICD-10-CM

## 2021-05-05 DIAGNOSIS — Z23 Encounter for immunization: Secondary | ICD-10-CM

## 2021-05-05 DIAGNOSIS — G8929 Other chronic pain: Secondary | ICD-10-CM

## 2021-05-05 DIAGNOSIS — I10 Essential (primary) hypertension: Secondary | ICD-10-CM

## 2021-05-05 DIAGNOSIS — R053 Chronic cough: Secondary | ICD-10-CM

## 2021-05-05 DIAGNOSIS — G2581 Restless legs syndrome: Secondary | ICD-10-CM

## 2021-05-05 DIAGNOSIS — J449 Chronic obstructive pulmonary disease, unspecified: Secondary | ICD-10-CM

## 2021-05-05 DIAGNOSIS — N401 Enlarged prostate with lower urinary tract symptoms: Secondary | ICD-10-CM

## 2021-05-05 DIAGNOSIS — M170 Bilateral primary osteoarthritis of knee: Secondary | ICD-10-CM

## 2021-05-05 DIAGNOSIS — E785 Hyperlipidemia, unspecified: Secondary | ICD-10-CM

## 2021-05-05 MED ORDER — GUAIFENESIN 200 MG PO TABS: 400.0000 mg | ORAL_TABLET | Freq: Two times a day (BID) | ORAL | 0 refills | Status: DC

## 2021-05-05 MED ORDER — GABAPENTIN 300 MG PO CAPS: 300.0000 mg | ORAL_CAPSULE | Freq: Two times a day (BID) | ORAL | 1 refills | Status: DC

## 2021-05-05 MED ORDER — FINASTERIDE 5 MG PO TABS: 5.0000 mg | ORAL_TABLET | Freq: Every day | ORAL | 1 refills | Status: AC

## 2021-05-05 MED ORDER — ALBUTEROL SULFATE HFA 108 (90 BASE) MCG/ACT IN AERS: 2.0000 | INHALATION_SPRAY | Freq: Four times a day (QID) | RESPIRATORY_TRACT | 2 refills | Status: DC | PRN

## 2021-05-05 MED ORDER — METHOCARBAMOL 500 MG PO TABS: 500.0000 mg | ORAL_TABLET | Freq: Every evening | ORAL | 0 refills | Status: DC | PRN

## 2021-05-05 NOTE — Progress Notes (Signed)
Careteam: Patient Care Team: [name], MD as PCP - General (Family Medicine) Revankar, Reita Cliche, MD as PCP - Cardiology (Cardiology) Druscilla Brownie, MD as Consulting Physician (Dermatology) Garvin Fila, MD as Consulting Physician (Neurology)  Seen by: Windell Moulding, AGNP-C  PLACE OF SERVICE:  [place]  Advanced Directive information    Allergies  Allergen Reactions   Cefazolin Rash    The patient had surgery and was given cefazolin intraop. ~ 10 days later he developed a rash confirmed by biopsy to be consistent w/ drug eruption. We cannot know for sure, but this is the most likely agent.      No chief complaint on file.    HPI: Patient is a 75 y.o. male seen today for medical management of chronic conditions.   He is not fasting for lab work today.   Blood pressure- continues to take Benicar for blood pressure. Does not check blood pressure at home.Tries to limit salty foods. Denies chest pain, blurred vision and headaches.   HLD- tries to follow low fat diet.   COPD- needs refill on albuterol inhaler. Starting to have chronic productive cough with clear sputum daily. He is not taking anything to help with cough. Denies other cold symptoms.  Chronic back pain- taking robaxin every night for the past week. Pain to right lower back pain/ pelvis area. History of back surgery in past. Back pain rated 6/10. Continues to take gabapentin every night. Requesting PT referral.   Urinary frequency unchanged. Continues to take Finasteride, Myrbetriq and Flomax.   Knee pain ongoing. Ambulates with cane. No recent falls.   Does not want covid booster vaccines.       Review of Systems:  Review of Systems  Constitutional:  Negative for chills, fever, malaise/fatigue and weight loss.  HENT:  Negative for congestion, hearing loss and sore throat.   Eyes:  Negative for blurred vision and double vision.  Respiratory:  Positive for cough and sputum production.  Negative for shortness of breath and wheezing.   Cardiovascular:  Negative for chest pain and leg swelling.  Gastrointestinal:  Negative for abdominal pain, blood in stool, constipation, diarrhea, heartburn, nausea and vomiting.  Genitourinary:  Positive for frequency. Negative for dysuria and hematuria.  Musculoskeletal:  Positive for back pain and joint pain. Negative for falls and myalgias.  Skin: Negative.   Neurological:  Negative for dizziness, weakness and headaches.  Psychiatric/Behavioral:  Negative for depression. The patient is not nervous/anxious and does not have insomnia.    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   Abscess of left leg    Adenomatous colon polyp    Adrenal mass (HCC)    2 benign appearing left adrenal adenomas noted on 01/13/15 CT   Allergy    seasonal   Aortic stenosis    mild to moderate by echo and cath in 09/2016   Arthritis    Carotid artery occlusion    Cataract    Chronic back pain    COPD (chronic obstructive pulmonary disease) (HCC)    Coronary artery disease    CVA (cerebral infarction)    Degeneration of cervical intervertebral disc    Diverticulosis    History of back surgery    Rods and Screws in back   History of left knee replacement    Hypertension    Meningioma (HCC)    Nocturia  Peripheral vascular disease ([place])    Ringing in ear    (SLIGHT)   Sleep apnea    wears BIPAP set on 5 to 9   Stroke [facility]) 2013   tia no residual deficit from   Past Surgical History:  Procedure Laterality Date   ABDOMINAL AORTIC ANEURYSM REPAIR  2019   [place]  as child   BACK SURGERY  2018   Rods and Screws in Back lower back   BRAIN MENINGIOMA EXCISION  1991   menigioma   CARPAL TUNNEL RELEASE     left   EYE SURGERY Bilateral    Cataract   IRRIGATION AND DEBRIDEMENT ABSCESS Left 08/25/2020   Procedure: IRRIGATION AND DEBRIDEMENT ABSCESS LEFT  LEG;  Surgeon: Evelina Bucy, DPM;  Location: [place];  Service: Podiatry;  Laterality: Left;   JOINT REPLACEMENT Left 04-01-12   Knee   LEFT HEART CATH AND CORONARY ANGIOGRAPHY N/A 09/15/2016   Procedure: Left Heart Cath and Coronary Angiography;  Surgeon: Nelva Bush, MD;  Location: [place] [facility];  Service: Cardiovascular;  Laterality: N/A;   MAXIMUM ACCESS (MAS)POSTERIOR LUMBAR INTERBODY FUSION (PLIF) 1 LEVEL N/A 07/07/2015   Procedure:  POSTERIOR LUMBAR INTERBODY FUSION (PLIF) Lumbar Four-Five with Pedicle Screw Fixation Lumbar Two-Five;Laminectomy Lumbar Two-Five;  Surgeon: Eustace Moore, MD;  Location: [place] [facility];  Service: Neurosurgery;  Laterality: N/A;   POSTERIOR LUMBAR INTERBODY FUSION (PLIF) Lumbar Four-Five with Pedicle Screw Fixation Lumbar Two-Five;Laminectomy Lumbar Two-Five   RIGHT/LEFT HEART CATH AND CORONARY ANGIOGRAPHY N/A 01/24/2018   Procedure: RIGHT/LEFT HEART CATH AND CORONARY ANGIOGRAPHY;  Surgeon: Nelva Bush, MD;  Location: [place] [facility];  Service: Cardiovascular;  Laterality: N/A;   TONSILLECTOMY  as child   TOTAL KNEE ARTHROPLASTY  04/01/2012   Procedure: TOTAL KNEE ARTHROPLASTY;  Surgeon: Gearlean Alf, MD;  Location: [facility];  Service: Orthopedics;  Laterality: Left;   TRANSURETHRAL RESECTION OF PROSTATE     UMBILICAL HERNIA REPAIR  2011   Social History:   reports that he quit smoking about 21 months ago. His smoking use included cigarettes. He has a 30.00 pack-year smoking history. He has never used smokeless tobacco. He reports current alcohol use. He reports that he does not use drugs.  Family History  Problem Relation Age of Onset   Heart disease Mother        Onset ~44 y/o   Hypertension Mother        Deceased from old age at 72   Hyperlipidemia Mother    Arthritis Mother    Diabetes Father        Deceased from old age at 21   Heart attack Father    Heart disease Father        CABG at age 62   Dementia Father  17   Arthritis Father    Prostate cancer Maternal Grandfather    Colon cancer Neg Hx    Esophageal cancer Neg Hx    Rectal cancer Neg Hx    Stomach cancer Neg Hx     Medications: Patient's Medications  New Prescriptions   No medications on file  Previous Medications   ACETAMINOPHEN (TYLENOL) 500 MG TABLET    Take 500 mg by mouth at bedtime.    ALBUTEROL (VENTOLIN HFA) 108 (90 BASE) MCG/ACT INHALER    Inhale 2 puffs into the lungs every 6 (six) hours as needed for wheezing or shortness of breath.   AZO-CRANBERRY PO  Take 2 tablets by mouth at bedtime.   B COMPLEX VITAMINS CAPSULE    Take 1 capsule by mouth daily.   CHOLECALCIFEROL (D-3-5) 125 MCG (5000 UT) CAPSULE    Take 5,000 Units by mouth daily.   CLOPIDOGREL (PLAVIX) 75 MG TABLET    Take 1 tablet (75 mg total) by mouth daily.   FINASTERIDE (PROSCAR) 5 MG TABLET    Take 1 tablet (5 mg total) by mouth daily.   GABAPENTIN (NEURONTIN) 300 MG CAPSULE    Take 1 capsule (300 mg total) by mouth at bedtime.   LOPERAMIDE (IMODIUM) 2 MG CAPSULE    Take 2 mg by mouth as needed.    METHOCARBAMOL (ROBAXIN) 500 MG TABLET    Take 1 tablet (500 mg total) by mouth at bedtime as needed for muscle spasms.   MIRABEGRON ER (MYRBETRIQ) 25 MG TB24 TABLET    Take 25 mg by mouth daily.   OLMESARTAN (BENICAR) 40 MG TABLET    Take 1 tablet (40 mg total) by mouth daily.   SILVER SULFADIAZINE (SILVADENE) 1 % CREAM    Apply pea-sized amount to wound daily.   TAMSULOSIN (FLOMAX) 0.4 MG CAPS CAPSULE    Take 1 capsule (0.4 mg total) by mouth daily after supper.  Modified Medications   No medications on file  Discontinued Medications   No medications on file    Physical Exam:  There were no vitals filed for this visit. There is no height or weight on file to calculate BMI. Wt Readings from Last 3 Encounters:  01/11/21 224 lb (101.6 kg)  09/07/20 224 lb 9.6 oz (101.9 kg)  08/25/20 222 lb 9.6 oz (101 kg)    Physical Exam Vitals reviewed.   Constitutional:      General: He is not in acute distress. HENT:     Head: Normocephalic.  Eyes:     General:        Right eye: No discharge.        Left eye: No discharge.  Neck:     Vascular: No carotid bruit.  Cardiovascular:     Rate and Rhythm: Normal rate and regular rhythm.     Pulses: Normal pulses.     Heart sounds: Murmur heard.  Pulmonary:     Effort: Pulmonary effort is normal. No respiratory distress.     Breath sounds: Normal breath sounds. No wheezing.  Abdominal:     General: Bowel sounds are normal. There is no distension.     Palpations: Abdomen is soft.     Tenderness: There is no abdominal tenderness.  Musculoskeletal:     Cervical back: Normal range of motion.     Right lower leg: Edema present.     Left lower leg: Edema present.     Comments: Non-pitting  Lymphadenopathy:     Cervical: No cervical adenopathy.  Skin:    General: Skin is warm and dry.     Capillary Refill: Capillary refill takes less than 2 seconds.  Neurological:     General: No focal deficit present.     Mental Status: He is alert and oriented to person, place, and time.     Gait: Gait abnormal.     Comments: cane  Psychiatric:        Mood and Affect: Mood normal.        Behavior: Behavior normal.    Labs reviewed: Basic Metabolic Panel: Recent Labs    08/13/20 1348 08/25/20 1036  NA 140 142  K  4.4 4.1  CL 107 101  CO2 28  --   GLUCOSE 115* 115*  BUN 18 23  CREATININE 0.69 0.70  CALCIUM 8.9  --    Liver Function Tests: Recent Labs    08/13/20 1348  AST 16  ALT 17  ALKPHOS 66  BILITOT 1.2  PROT 6.4*  ALBUMIN 3.8   No results for input(s): LIPASE, AMYLASE in the last 8760 hours. No results for input(s): AMMONIA in the last 8760 hours. CBC: Recent Labs    08/13/20 1348 08/25/20 1036  WBC 8.7  --   NEUTROABS 6.9  --   HGB 14.7 15.3  HCT 43.9 45.0  MCV 95.6  --   PLT 195  --    Lipid Panel: No results for input(s): CHOL, HDL, LDLCALC, TRIG, CHOLHDL,  LDLDIRECT in the last 8760 hours. TSH: No results for input(s): TSH in the last 8760 hours. A1C: Lab Results  Component Value Date   HGBA1C 5.5 09/25/2019     Assessment/Plan 1. Need for influenza vaccination - Flu Vaccine QUAD High Dose(Fluad)  2. BPH with obstruction/lower urinary tract symptoms - ongoing, no increase in frequency - cont finasteride, flomax and myrbetriq - finasteride (PROSCAR) 5 MG tablet; Take 1 tablet (5 mg total) by mouth daily.  Dispense: 90 tablet; Refill: 1  3. Chronic bilateral low back pain with left-sided sciatica - ongoing, using robaxin more often - will increase gabapentin to 300 mg po bid - cont robaxin qhs prn - Ambulatory referral to Physical Therapy; Future - methocarbamol (ROBAXIN) 500 MG tablet; Take 1 tablet (500 mg total) by mouth at bedtime as needed for muscle spasms.  Dispense: 30 tablet; Refill: 0  4. Restless leg syndrome - stable with gabapentin - gabapentin (NEURONTIN) 300 MG capsule; Take 1 capsule (300 mg total) by mouth 2 (two) times daily.  Dispense: 90 capsule; Refill: 1  5. COPD GOLD II - suspect he has developed chronic bronchitis - lung sounds clear today, sats 90% - will start mucinex to start with secretions- see below - albuterol (VENTOLIN HFA) 108 (90 Base) MCG/ACT inhaler; Inhale 2 puffs into the lungs every 6 (six) hours as needed for wheezing or shortness of breath.  Dispense: 6.7 g; Refill:   6. Essential hypertension - controlled - cont Benicar - CBC with Differential/Platelet; Future - CMP; Future  7. Primary osteoarthritis of both knees - ongoing - cont tylenol 500 mf qhs  8. Morbid obesity ([place]) - BMI 35.93 - exercise limited due to back and knee pain - recommend limiting calories, consume < 1800 calories/day  9. Hyperlipidemia LDL goal <70 - LDL 95 04/08/2021 - Lipid Panel; Future  10. Chronic cough - increased cough with clear sputum - guaiFENesin 200 MG tablet; Take 2 tablets (400 mg total)  by mouth 2 (two) times daily.  Dispense: 90 tablet; Refill: 0  Total time: 35 minutes. Greater than 50% of total time spent doing patient education on symptom and medication management regarding chronic cough, back pain and health maintenance.   Next appt: none Skyy Mcknight [place], [facility] [phone]

## 2021-05-05 NOTE — Patient Instructions (Signed)
Please schedule fasting labs within next week  Referral to PT made  Increase gabapentin for pain to twice daily  Start guaifenesin for chronic cough

## 2021-05-09 ENCOUNTER — Other Ambulatory Visit: Payer: Self-pay | Admitting: Orthopedic Surgery

## 2021-05-09 ENCOUNTER — Telehealth: Payer: Self-pay | Admitting: *Deleted

## 2021-05-09 DIAGNOSIS — J069 Acute upper respiratory infection, unspecified: Secondary | ICD-10-CM

## 2021-05-09 MED ORDER — AZITHROMYCIN 250 MG PO TABS: ORAL_TABLET | ORAL | 0 refills | Status: AC

## 2021-05-09 NOTE — Telephone Encounter (Signed)
Prescription for z-pack (azithromycin) sent to [facility].

## 2021-05-09 NOTE — Telephone Encounter (Signed)
Patient notified and agreed.  

## 2021-05-09 NOTE — Telephone Encounter (Signed)
Patient wife, Kyle Lewis called and stated that patient was seen on 05/05/2021. Stated that his cough is no better and now coughing up Yellow congestion. No fever. No other symptom except cough and congestion. Patient is taking the guaifenesin with no relief.   Wife is requesting an antibiotic to be called to the pharmacy because she is fearful of it turning into pneumonia. Patient unable to come into office.   Please Advise.

## 2021-05-10 ENCOUNTER — Ambulatory Visit: Payer: Medicare Other | Admitting: Family Medicine

## 2021-05-11 ENCOUNTER — Telehealth: Payer: Self-pay | Admitting: *Deleted

## 2021-05-11 NOTE — Telephone Encounter (Signed)
Suppressing a cough is not advised. Recommend cough drops or OTC Robitussin. Continue mucinex. If cough does not improve, recommend scheduling appointment.

## 2021-05-11 NOTE — Telephone Encounter (Signed)
Patient wife notified and agreed.  

## 2021-05-11 NOTE — Telephone Encounter (Signed)
Patient wife called and stated that you saw patient on 12/1 for URI and gave him a Z-Pac. Patient is better but has a terrible cough. Non Productive. No fever.   Patient is requesting a cough medication to be sent to pharmacy to help ease this deep cough.   Please Advise.

## 2021-05-18 ENCOUNTER — Other Ambulatory Visit: Payer: Medicare Other

## 2021-05-20 ENCOUNTER — Ambulatory Visit (INDEPENDENT_AMBULATORY_CARE_PROVIDER_SITE_OTHER): Payer: Medicare Other | Admitting: Adult Health

## 2021-05-20 ENCOUNTER — Other Ambulatory Visit: Payer: Medicare Other

## 2021-05-20 ENCOUNTER — Encounter: Payer: Self-pay | Admitting: Adult Health

## 2021-05-20 ENCOUNTER — Other Ambulatory Visit: Payer: Self-pay

## 2021-05-20 VITALS — BP 140/88 | HR 62 | Temp 97.9°F | Resp 16 | Ht 66.0 in

## 2021-05-20 DIAGNOSIS — J209 Acute bronchitis, unspecified: Secondary | ICD-10-CM

## 2021-05-20 DIAGNOSIS — J440 Chronic obstructive pulmonary disease with acute lower respiratory infection: Secondary | ICD-10-CM

## 2021-05-20 LAB — CBC WITH DIFFERENTIAL/PLATELET
Absolute Monocytes: 401 cells/uL (ref 200–950)
Basophils Absolute: 41 cells/uL (ref 0–200)
Basophils Relative: 0.6 %
Eosinophils Absolute: 61 cells/uL (ref 15–500)
Eosinophils Relative: 0.9 %
HCT: 49.3 % (ref 38.5–50.0)
Hemoglobin: 16.3 g/dL (ref 13.2–17.1)
Lymphs Abs: 884 cells/uL (ref 850–3900)
MCH: 31.4 pg (ref 27.0–33.0)
MCHC: 33.1 g/dL (ref 32.0–36.0)
MCV: 95 fL (ref 80.0–100.0)
MPV: 8.5 fL (ref 7.5–12.5)
Monocytes Relative: 5.9 %
Neutro Abs: 5413 cells/uL (ref 1500–7800)
Neutrophils Relative %: 79.6 %
Platelets: 213 10*3/uL (ref 140–400)
RBC: 5.19 10*6/uL (ref 4.20–5.80)
RDW: 13.2 % (ref 11.0–15.0)
Total Lymphocyte: 13 %
WBC: 6.8 10*3/uL (ref 3.8–10.8)

## 2021-05-20 LAB — BASIC METABOLIC PANEL WITH GFR
BUN: 16 mg/dL (ref 7–25)
CO2: 37 mmol/L — ABNORMAL HIGH (ref 20–32)
Calcium: 8.4 mg/dL — ABNORMAL LOW (ref 8.6–10.3)
Chloride: 102 mmol/L (ref 98–110)
Creat: 0.71 mg/dL (ref 0.70–1.28)
Glucose, Bld: 106 mg/dL (ref 65–139)
Potassium: 4.3 mmol/L (ref 3.5–5.3)
Sodium: 145 mmol/L (ref 135–146)
eGFR: 96 mL/min/{1.73_m2} (ref >=60)

## 2021-05-20 MED ORDER — SACCHAROMYCES BOULARDII 250 MG PO CAPS: 250.0000 mg | ORAL_CAPSULE | Freq: Two times a day (BID) | ORAL | 0 refills | Status: DC

## 2021-05-20 MED ORDER — AZITHROMYCIN 250 MG PO TABS: ORAL_TABLET | ORAL | 0 refills | Status: AC

## 2021-05-20 MED ORDER — DEXAMETHASONE 4 MG PO TABS: 4.0000 mg | ORAL_TABLET | Freq: Every day | ORAL | 0 refills | Status: DC

## 2021-05-20 NOTE — Patient Instructions (Signed)
Acute Bronchitis, Adult ?Acute bronchitis is when air tubes in the lungs (bronchi) suddenly get swollen. The condition can make it hard for you to breathe. In adults, acute bronchitis usually goes away within 2 weeks. A cough caused by bronchitis may last up to 3 weeks. Smoking, allergies, and asthma can make the condition worse. ?What are the causes? ?Germs that cause cold and flu (viruses). The most common cause of this condition is the virus that causes the common cold. ?Bacteria. ?Substances that bother (irritate) the lungs, including: ?Smoke from cigarettes and other types of tobacco. ?Dust and pollen. ?Fumes from chemicals, gases, or burned fuel. ?Indoor or outdoor air pollution. ?What increases the risk? ?A weak body's defense system. This is also called the immune system. ?Any condition that affects your lungs and breathing, such as asthma. ?What are the signs or symptoms? ?A cough. ?Coughing up clear, yellow, or green mucus. ?Making high-pitched whistling sounds when you breathe, most often when you breathe out (wheezing). ?Runny or stuffy nose. ?Having too much mucus in your lungs (chest congestion). ?Shortness of breath. ?Body aches. ?A sore throat. ?How is this treated? ?Acute bronchitis may go away over time without treatment. Your doctor may tell you to: ?Drink more fluids. This will help thin your mucus so it is easier to cough up. ?Use a device that gets medicine into your lungs (inhaler). ?Use a vaporizer or a humidifier. These are machines that add water to the air. This helps with coughing and poor breathing. ?Take a medicine that thins mucus and helps clear it from your lungs. ?Take a medicine that prevents or stops coughing. ?It is not common to take an antibiotic medicine for this condition. ?Follow these instructions at home: ? ?Take over-the-counter and prescription medicines only as told by your doctor. ?Use an inhaler, vaporizer, or humidifier as told by your doctor. ?Take two teaspoons (10  mL) of honey at bedtime. This helps lessen your coughing at night. ?Drink enough fluid to keep your pee (urine) pale yellow. ?Do not smoke or use any products that contain nicotine or tobacco. If you need help quitting, ask your doctor. ?Get a lot of rest. ?Return to your normal activities when your doctor says that it is safe. ?Keep all follow-up visits. ?How is this prevented? ? ?Wash your hands often with soap and water for at least 20 seconds. If you cannot use soap and water, use hand sanitizer. ?Avoid contact with people who have cold symptoms. ?Try not to touch your mouth, nose, or eyes with your hands. ?Avoid breathing in smoke or chemical fumes. ?Make sure to get the flu shot every year. ?Contact a doctor if: ?Your symptoms do not get better in 2 weeks. ?You have trouble coughing up the mucus. ?Your cough keeps you awake at night. ?You have a fever. ?Get help right away if: ?You cough up blood. ?You have chest pain. ?You have very bad shortness of breath. ?You faint or keep feeling like you are going to faint. ?You have a very bad headache. ?Your fever or chills get worse. ?These symptoms may be an emergency. Get help right away. Call your local emergency services (911 in the [place] ?Do not wait to see if the symptoms will go away. ?Do not drive yourself to the hospital. ?Summary ?Acute bronchitis is when air tubes in the lungs (bronchi) suddenly get swollen. In adults, acute bronchitis usually goes away within 2 weeks. ?Drink more fluids. This will help thin your mucus so it is easier to   cough up. ?Take over-the-counter and prescription medicines only as told by your doctor. ?Contact a doctor if your symptoms do not improve after 2 weeks of treatment. ?This information is not intended to replace advice given to you by your health care provider. Make sure you discuss any questions you have with your health care provider. ?Document Revised: 09/22/2020 Document Reviewed: 09/22/2020 ?Elsevier Patient Education  ? 2022 [employer] ? ?

## 2021-05-20 NOTE — Progress Notes (Signed)
[facility] clinic  Provider:   Durenda Age  DNP  Code Status:  Full Code  Goals of Care:  Advanced Directives 05/20/2021  Does Patient Have a Medical Advance Directive? No  Does patient want to make changes to medical advance directive? -  Would patient like information on creating a medical advance directive? No - Patient declined  Pre-existing out of facility DNR order (yellow form or pink MOST form) -     Chief Complaint  Patient presents with   Acute Visit    Patient complains of chest congestion, puffy eyes, SOB, and coughing. Complains of possible PNE. Negative Covid Test done at home.    HPI: Patient is a 75 y.o. male seen today for an acute visit. He has a PMH of COPD, history of CVA, BPH, seasonal allergy and CAD. He was accompanied today by wife. He complains of chest congestion, puffy eyes, shortness of breath and coughing. He has 2 COVID-19 vaccines. Wife stated that he tested negative for COVID-19 at home.Marland Kitchen He denies fever nor chills.   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   Abscess of left leg    Adenomatous colon polyp    Adrenal mass (HCC)    2 benign appearing left adrenal adenomas noted on 01/13/15 CT   Allergy    seasonal   Aortic stenosis    mild to moderate by echo and cath in 09/2016   Arthritis    Carotid artery occlusion    Cataract    Chronic back pain    COPD (chronic obstructive pulmonary disease) (HCC)    Coronary artery disease    CVA (cerebral infarction)    Degeneration of cervical intervertebral disc    Diverticulosis    History of back surgery    Rods and Screws in back   History of left knee replacement    Hypertension    Meningioma (HCC)    Nocturia    Peripheral vascular disease ([place])    Ringing in ear    (SLIGHT)   Sleep apnea    wears BIPAP set on 5 to 9   Stroke ([place]) 2013   tia no residual deficit from    Past Surgical History:   Procedure Laterality Date   ABDOMINAL AORTIC ANEURYSM REPAIR  2019   [place]  as child   BACK SURGERY  2018   Rods and Screws in Back lower back   BRAIN MENINGIOMA EXCISION  1991   menigioma   CARPAL TUNNEL RELEASE     left   EYE SURGERY Bilateral    Cataract   IRRIGATION AND DEBRIDEMENT ABSCESS Left 08/25/2020   Procedure: IRRIGATION AND DEBRIDEMENT ABSCESS LEFT LEG;  Surgeon: Evelina Bucy, DPM;  Location: [facility];  Service: Podiatry;  Laterality: Left;   JOINT REPLACEMENT Left 04-01-12   Knee   LEFT HEART CATH AND CORONARY ANGIOGRAPHY N/A 09/15/2016   Procedure: Left Heart Cath and Coronary Angiography;  Surgeon: Nelva Bush, MD;  Location: [place] [facility];  Service: Cardiovascular;  Laterality: N/A;   MAXIMUM ACCESS (MAS)POSTERIOR LUMBAR INTERBODY FUSION (PLIF) 1 LEVEL N/A 07/07/2015   Procedure:  POSTERIOR LUMBAR INTERBODY FUSION (PLIF) Lumbar Four-Five with Pedicle Screw Fixation Lumbar Two-Five;Laminectomy Lumbar Two-Five;  Surgeon: Eustace Moore, MD;  Location: [facility];  Service: Neurosurgery;  Laterality: N/A;   POSTERIOR LUMBAR INTERBODY FUSION (PLIF) Lumbar Four-Five  with Pedicle Screw Fixation Lumbar Two-Five;Laminectomy Lumbar Two-Five   RIGHT/LEFT HEART CATH AND CORONARY ANGIOGRAPHY N/A 01/24/2018   Procedure: RIGHT/LEFT HEART CATH AND CORONARY ANGIOGRAPHY;  Surgeon: Nelva Bush, MD;  Location: [place] [facility];  Service: Cardiovascular;  Laterality: N/A;   TONSILLECTOMY  as child   TOTAL KNEE ARTHROPLASTY  04/01/2012   Procedure: TOTAL KNEE ARTHROPLASTY;  Surgeon: Gearlean Alf, MD;  Location: [facility];  Service: Orthopedics;  Laterality: Left;   TRANSURETHRAL RESECTION OF PROSTATE     UMBILICAL HERNIA REPAIR  2011    Allergies  Allergen Reactions   Cefazolin Rash    The patient had surgery and was given cefazolin intraop. ~ 10 days later he developed a rash confirmed by biopsy to be consistent w/ drug eruption.  We cannot know for sure, but this is the most likely agent.      Outpatient Encounter Medications as of 05/20/2021  Medication Sig   acetaminophen (TYLENOL) 500 MG tablet Take 500 mg by mouth at bedtime.    albuterol (VENTOLIN HFA) 108 (90 Base) MCG/ACT inhaler Inhale 2 puffs into the lungs every 6 (six) hours as needed for wheezing or shortness of breath.   AZO-CRANBERRY PO Take 2 tablets by mouth at bedtime.   b complex vitamins capsule Take 1 capsule by mouth daily.   Cholecalciferol (D-3-5) 125 MCG (5000 UT) capsule Take 5,000 Units by mouth daily.   clopidogrel (PLAVIX) 75 MG tablet Take 1 tablet (75 mg total) by mouth daily.   finasteride (PROSCAR) 5 MG tablet Take 1 tablet (5 mg total) by mouth daily.   gabapentin (NEURONTIN) 300 MG capsule Take 1 capsule (300 mg total) by mouth 2 (two) times daily.   guaiFENesin 200 MG tablet Take 2 tablets (400 mg total) by mouth 2 (two) times daily.   loperamide (IMODIUM) 2 MG capsule Take 2 mg by mouth as needed.    methocarbamol (ROBAXIN) 500 MG tablet Take 1 tablet (500 mg total) by mouth at bedtime as needed for muscle spasms.   mirabegron ER (MYRBETRIQ) 25 MG TB24 tablet Take 25 mg by mouth daily.   olmesartan (BENICAR) 40 MG tablet Take 1 tablet (40 mg total) by mouth daily.   tamsulosin (FLOMAX) 0.4 MG CAPS capsule Take 1 capsule (0.4 mg total) by mouth daily after supper.   No facility-administered encounter medications on file as of 05/20/2021.    Review of Systems:  Review of Systems  Constitutional:  Negative for activity change, appetite change, chills and fever.  HENT:  Positive for congestion. Negative for rhinorrhea.   Eyes:  Negative for pain and redness.  Respiratory:  Positive for cough and shortness of breath. Negative for wheezing.   Cardiovascular:  Negative for chest pain.  Gastrointestinal:  Negative for abdominal pain, constipation, nausea and vomiting.  Genitourinary:  Negative for difficulty urinating and frequency.   Skin:  Negative for rash.  Neurological:  Negative for dizziness, numbness and headaches.  Psychiatric/Behavioral: Negative.     Health Maintenance  Topic Date Due   Zoster Vaccines- Shingrix (1 of 2) Never done   Pneumonia Vaccine 24+ Years old (2 - PCV) 05/28/2012   TETANUS/TDAP  11/03/2017   COVID-19 Vaccine (3 - Booster for Pfizer series) 03/31/2020   COLONOSCOPY (Pts 45-94yrs Insurance coverage will need to be confirmed)  09/18/2021   INFLUENZA VACCINE  Completed   Hepatitis C Screening  Completed   HPV VACCINES  Aged Out    Physical Exam: Vitals:   05/20/21 1558  BP: 140/88  Pulse: 62  Resp: 16  Temp: 97.9 F (36.6 C)  Height: $Remove'5\' 6"'kLXjAbv$  (1.676 m)   Body mass index is 35.93 kg/m. Physical Exam Constitutional:      Appearance: He is obese.  HENT:     Head: Normocephalic.     Right Ear: External ear normal.     Left Ear: External ear normal.     Nose: No congestion.     Mouth/Throat:     Mouth: Mucous membranes are moist.  Eyes:     Conjunctiva/sclera: Conjunctivae normal.     Comments: Bilateral periorbital puffiness  Cardiovascular:     Rate and Rhythm: Normal rate and regular rhythm.  Abdominal:     General: Bowel sounds are normal.     Palpations: Abdomen is soft.  Musculoskeletal:     Cervical back: Normal range of motion and neck supple.     Right lower leg: No edema.     Left lower leg: No edema.  Skin:    General: Skin is warm and dry.  Neurological:     General: No focal deficit present.  Psychiatric:        Mood and Affect: Mood normal.        Behavior: Behavior normal.        Thought Content: Thought content normal.        Judgment: Judgment normal.    Labs reviewed: Basic Metabolic Panel: Recent Labs    08/13/20 1348 08/25/20 1036  NA 140 142  K 4.4 4.1  CL 107 101  CO2 28  --   GLUCOSE 115* 115*  BUN 18 23  CREATININE 0.69 0.70  CALCIUM 8.9  --    Liver Function Tests: Recent Labs    08/13/20 1348  AST 16  ALT 17  ALKPHOS  66  BILITOT 1.2  PROT 6.4*  ALBUMIN 3.8   No results for input(s): LIPASE, AMYLASE in the last 8760 hours. No results for input(s): AMMONIA in the last 8760 hours. CBC: Recent Labs    08/13/20 1348 08/25/20 1036  WBC 8.7  --   NEUTROABS 6.9  --   HGB 14.7 15.3  HCT 43.9 45.0  MCV 95.6  --   PLT 195  --    Lipid Panel: No results for input(s): CHOL, HDL, LDLCALC, TRIG, CHOLHDL, LDLDIRECT in the last 8760 hours. Lab Results  Component Value Date   HGBA1C 5.5 09/25/2019    Procedures since last visit: No results found.  Assessment/Plan  1. Acute bronchitis with COPD ([place]) - azithromycin (ZITHROMAX) 250 MG tablet; Take 2 tablets on day 1, then 1 tablet daily on days 2 through 5  Dispense: 6 tablet; Refill: 0 - saccharomyces boulardii (FLORASTOR) 250 MG capsule; Take 1 capsule (250 mg total) by mouth 2 (two) times daily for 9 days.  Dispense: 18 capsule; Refill: 0 - dexamethasone (DECADRON) 4 MG tablet; Take 1 tablet (4 mg total) by mouth daily for 7 days.  Dispense: 7 tablet; Refill: 0 - CBC with Differential/Platelets - BMP with eGFR([facility]) - SARS-COV-2 RNA,(COVID-19) QUAL NAAT    Labs/tests ordered:  BMP with GFR, CBC with differentials, SARS-COV RNA  Next appt:  09/13/2021

## 2021-05-21 LAB — SARS-COV-2 RNA,(COVID-19) QUALITATIVE NAAT: SARS CoV2 RNA: NOT DETECTED

## 2021-05-21 NOTE — Progress Notes (Signed)
CO2 slightly elevated @ 37 (normal level 2-  32). Continue to use BiPAP when sleeping.

## 2021-05-24 ENCOUNTER — Telehealth: Payer: Self-pay | Admitting: *Deleted

## 2021-05-24 NOTE — Telephone Encounter (Signed)
Kyle Honour, MD  You Just now (1:05 PM)   Given zithromax which onle given for 5 days, but keeps working for 5 more       LMOM to return call.

## 2021-05-24 NOTE — Telephone Encounter (Signed)
Patient wife notified and agreed. Stated that she will call us if patient doesn't get better or if he gets worse.

## 2021-05-24 NOTE — Telephone Encounter (Signed)
Patient wife called and stated that patient is still having SOB, Fatigue and Chest Congestion with Non Productive cough. No Fever.  Stated that they have a PulseOx and his O2 is ranging between 61-91%  Stated that patient was seen on Friday and given Antibiotic and Prednisone but he is almost out.   Wife thinks not enough was given and requesting a longer supply.   I offered patient an appointment for today for reevaluation but they refused. Stated that they Want YOUR ADVICE/Recommendation.   Please Advise.

## 2021-05-27 ENCOUNTER — Other Ambulatory Visit: Payer: Self-pay

## 2021-05-27 ENCOUNTER — Emergency Department (HOSPITAL_COMMUNITY): Payer: Medicare Other

## 2021-05-27 ENCOUNTER — Encounter (HOSPITAL_COMMUNITY): Payer: Self-pay

## 2021-05-27 ENCOUNTER — Inpatient Hospital Stay (HOSPITAL_COMMUNITY)
Admission: EM | Admit: 2021-05-27 | Discharge: 2021-06-10 | DRG: 266 | Disposition: A | Discharge status: Home / Self Care | Payer: Medicare Other | Attending: Internal Medicine | Admitting: Internal Medicine

## 2021-05-27 ENCOUNTER — Telehealth: Payer: Self-pay | Admitting: *Deleted

## 2021-05-27 DIAGNOSIS — I739 Peripheral vascular disease, unspecified: Secondary | ICD-10-CM | POA: Diagnosis present

## 2021-05-27 DIAGNOSIS — J9621 Acute and chronic respiratory failure with hypoxia: Secondary | ICD-10-CM | POA: Diagnosis not present

## 2021-05-27 DIAGNOSIS — R351 Nocturia: Secondary | ICD-10-CM | POA: Diagnosis present

## 2021-05-27 DIAGNOSIS — Z86011 Personal history of benign neoplasm of the brain: Secondary | ICD-10-CM

## 2021-05-27 DIAGNOSIS — J9622 Acute and chronic respiratory failure with hypercapnia: Secondary | ICD-10-CM | POA: Diagnosis not present

## 2021-05-27 DIAGNOSIS — Z7902 Long term (current) use of antithrombotics/antiplatelets: Secondary | ICD-10-CM

## 2021-05-27 DIAGNOSIS — Z952 Presence of prosthetic heart valve: Secondary | ICD-10-CM | POA: Diagnosis not present

## 2021-05-27 DIAGNOSIS — I252 Old myocardial infarction: Secondary | ICD-10-CM | POA: Diagnosis not present

## 2021-05-27 DIAGNOSIS — R32 Unspecified urinary incontinence: Secondary | ICD-10-CM | POA: Diagnosis present

## 2021-05-27 DIAGNOSIS — J449 Chronic obstructive pulmonary disease, unspecified: Secondary | ICD-10-CM | POA: Diagnosis present

## 2021-05-27 DIAGNOSIS — Z83438 Family history of other disorder of lipoprotein metabolism and other lipidemia: Secondary | ICD-10-CM

## 2021-05-27 DIAGNOSIS — Z720 Tobacco use: Secondary | ICD-10-CM | POA: Diagnosis not present

## 2021-05-27 DIAGNOSIS — Z79899 Other long term (current) drug therapy: Secondary | ICD-10-CM

## 2021-05-27 DIAGNOSIS — Z8679 Personal history of other diseases of the circulatory system: Secondary | ICD-10-CM

## 2021-05-27 DIAGNOSIS — N183 Chronic kidney disease, stage 3 unspecified: Secondary | ICD-10-CM | POA: Diagnosis present

## 2021-05-27 DIAGNOSIS — M5030 Other cervical disc degeneration, unspecified cervical region: Secondary | ICD-10-CM | POA: Diagnosis present

## 2021-05-27 DIAGNOSIS — Z7982 Long term (current) use of aspirin: Secondary | ICD-10-CM

## 2021-05-27 DIAGNOSIS — I340 Nonrheumatic mitral (valve) insufficiency: Secondary | ICD-10-CM | POA: Diagnosis not present

## 2021-05-27 DIAGNOSIS — D49519 Neoplasm of unspecified behavior of unspecified kidney: Secondary | ICD-10-CM | POA: Diagnosis present

## 2021-05-27 DIAGNOSIS — R7303 Prediabetes: Secondary | ICD-10-CM | POA: Diagnosis present

## 2021-05-27 DIAGNOSIS — N189 Chronic kidney disease, unspecified: Secondary | ICD-10-CM | POA: Diagnosis not present

## 2021-05-27 DIAGNOSIS — I5043 Acute on chronic combined systolic (congestive) and diastolic (congestive) heart failure: Secondary | ICD-10-CM | POA: Diagnosis present

## 2021-05-27 DIAGNOSIS — J9602 Acute respiratory failure with hypercapnia: Secondary | ICD-10-CM | POA: Diagnosis not present

## 2021-05-27 DIAGNOSIS — Z8042 Family history of malignant neoplasm of prostate: Secondary | ICD-10-CM

## 2021-05-27 DIAGNOSIS — Z006 Encounter for examination for normal comparison and control in clinical research program: Secondary | ICD-10-CM

## 2021-05-27 DIAGNOSIS — Z8673 Personal history of transient ischemic attack (TIA), and cerebral infarction without residual deficits: Secondary | ICD-10-CM

## 2021-05-27 DIAGNOSIS — E8729 Other acidosis: Secondary | ICD-10-CM | POA: Diagnosis present

## 2021-05-27 DIAGNOSIS — J209 Acute bronchitis, unspecified: Secondary | ICD-10-CM

## 2021-05-27 DIAGNOSIS — F17210 Nicotine dependence, cigarettes, uncomplicated: Secondary | ICD-10-CM | POA: Diagnosis present

## 2021-05-27 DIAGNOSIS — G4733 Obstructive sleep apnea (adult) (pediatric): Secondary | ICD-10-CM | POA: Diagnosis not present

## 2021-05-27 DIAGNOSIS — E6609 Other obesity due to excess calories: Secondary | ICD-10-CM | POA: Diagnosis present

## 2021-05-27 DIAGNOSIS — I5033 Acute on chronic diastolic (congestive) heart failure: Secondary | ICD-10-CM | POA: Diagnosis present

## 2021-05-27 DIAGNOSIS — N400 Enlarged prostate without lower urinary tract symptoms: Secondary | ICD-10-CM | POA: Diagnosis present

## 2021-05-27 DIAGNOSIS — Z8249 Family history of ischemic heart disease and other diseases of the circulatory system: Secondary | ICD-10-CM

## 2021-05-27 DIAGNOSIS — Z20822 Contact with and (suspected) exposure to covid-19: Secondary | ICD-10-CM | POA: Diagnosis present

## 2021-05-27 DIAGNOSIS — I130 Hypertensive heart and chronic kidney disease with heart failure and stage 1 through stage 4 chronic kidney disease, or unspecified chronic kidney disease: Secondary | ICD-10-CM | POA: Diagnosis present

## 2021-05-27 DIAGNOSIS — I350 Nonrheumatic aortic (valve) stenosis: Principal | ICD-10-CM

## 2021-05-27 DIAGNOSIS — K089 Disorder of teeth and supporting structures, unspecified: Secondary | ICD-10-CM

## 2021-05-27 DIAGNOSIS — R627 Adult failure to thrive: Secondary | ICD-10-CM | POA: Diagnosis present

## 2021-05-27 DIAGNOSIS — I6523 Occlusion and stenosis of bilateral carotid arteries: Secondary | ICD-10-CM | POA: Diagnosis present

## 2021-05-27 DIAGNOSIS — I10 Essential (primary) hypertension: Secondary | ICD-10-CM | POA: Diagnosis present

## 2021-05-27 DIAGNOSIS — Z87891 Personal history of nicotine dependence: Secondary | ICD-10-CM | POA: Diagnosis present

## 2021-05-27 DIAGNOSIS — Z6836 Body mass index (BMI) 36.0-36.9, adult: Secondary | ICD-10-CM

## 2021-05-27 DIAGNOSIS — I509 Heart failure, unspecified: Secondary | ICD-10-CM | POA: Diagnosis not present

## 2021-05-27 DIAGNOSIS — I5032 Chronic diastolic (congestive) heart failure: Secondary | ICD-10-CM | POA: Diagnosis present

## 2021-05-27 DIAGNOSIS — E279 Disorder of adrenal gland, unspecified: Secondary | ICD-10-CM | POA: Diagnosis present

## 2021-05-27 DIAGNOSIS — I714 Abdominal aortic aneurysm, without rupture, unspecified: Secondary | ICD-10-CM | POA: Diagnosis not present

## 2021-05-27 DIAGNOSIS — R739 Hyperglycemia, unspecified: Secondary | ICD-10-CM | POA: Diagnosis not present

## 2021-05-27 DIAGNOSIS — F03A0 Unspecified dementia, mild, without behavioral disturbance, psychotic disturbance, mood disturbance, and anxiety: Secondary | ICD-10-CM | POA: Diagnosis present

## 2021-05-27 DIAGNOSIS — I779 Disorder of arteries and arterioles, unspecified: Secondary | ICD-10-CM | POA: Diagnosis not present

## 2021-05-27 DIAGNOSIS — M5136 Other intervertebral disc degeneration, lumbar region: Secondary | ICD-10-CM | POA: Diagnosis present

## 2021-05-27 DIAGNOSIS — Z86010 Personal history of colonic polyps: Secondary | ICD-10-CM

## 2021-05-27 DIAGNOSIS — Z8261 Family history of arthritis: Secondary | ICD-10-CM

## 2021-05-27 DIAGNOSIS — R0602 Shortness of breath: Secondary | ICD-10-CM | POA: Diagnosis present

## 2021-05-27 DIAGNOSIS — J9601 Acute respiratory failure with hypoxia: Secondary | ICD-10-CM | POA: Diagnosis present

## 2021-05-27 DIAGNOSIS — K5790 Diverticulosis of intestine, part unspecified, without perforation or abscess without bleeding: Secondary | ICD-10-CM | POA: Diagnosis present

## 2021-05-27 DIAGNOSIS — Z888 Allergy status to other drugs, medicaments and biological substances status: Secondary | ICD-10-CM

## 2021-05-27 DIAGNOSIS — E662 Morbid (severe) obesity with alveolar hypoventilation: Secondary | ICD-10-CM | POA: Diagnosis present

## 2021-05-27 DIAGNOSIS — E785 Hyperlipidemia, unspecified: Secondary | ICD-10-CM | POA: Diagnosis present

## 2021-05-27 DIAGNOSIS — Z833 Family history of diabetes mellitus: Secondary | ICD-10-CM

## 2021-05-27 DIAGNOSIS — Z01818 Encounter for other preprocedural examination: Secondary | ICD-10-CM

## 2021-05-27 DIAGNOSIS — G8929 Other chronic pain: Secondary | ICD-10-CM | POA: Diagnosis present

## 2021-05-27 DIAGNOSIS — Z96652 Presence of left artificial knee joint: Secondary | ICD-10-CM | POA: Diagnosis present

## 2021-05-27 DIAGNOSIS — Z981 Arthrodesis status: Secondary | ICD-10-CM

## 2021-05-27 DIAGNOSIS — I2510 Atherosclerotic heart disease of native coronary artery without angina pectoris: Secondary | ICD-10-CM | POA: Diagnosis present

## 2021-05-27 HISTORY — DX: Acute respiratory failure with hypoxia: J96.01

## 2021-05-27 LAB — CBC WITH DIFFERENTIAL/PLATELET
Abs Immature Granulocytes: 0.06 10*3/uL (ref 0.00–0.07)
Basophils Absolute: 0 10*3/uL (ref 0.0–0.1)
Basophils Relative: 0 %
Eosinophils Absolute: 0 10*3/uL (ref 0.0–0.5)
Eosinophils Relative: 0 %
HCT: 52.8 % — ABNORMAL HIGH (ref 39.0–52.0)
Hemoglobin: 16.6 g/dL (ref 13.0–17.0)
Immature Granulocytes: 1 %
Lymphocytes Relative: 12 %
Lymphs Abs: 0.9 10*3/uL (ref 0.7–4.0)
MCH: 31.9 pg (ref 26.0–34.0)
MCHC: 31.4 g/dL (ref 30.0–36.0)
MCV: 101.5 fL — ABNORMAL HIGH (ref 80.0–100.0)
Monocytes Absolute: 0.5 10*3/uL (ref 0.1–1.0)
Monocytes Relative: 7 %
Neutro Abs: 6.1 10*3/uL (ref 1.7–7.7)
Neutrophils Relative %: 80 %
Platelets: 186 10*3/uL (ref 150–400)
RBC: 5.2 MIL/uL (ref 4.22–5.81)
RDW: 14.5 % (ref 11.5–15.5)
WBC: 7.7 10*3/uL (ref 4.0–10.5)
nRBC: 0 % (ref 0.0–0.2)

## 2021-05-27 LAB — COMPREHENSIVE METABOLIC PANEL
ALT: 31 U/L (ref 0–44)
AST: 19 U/L (ref 15–41)
Albumin: 3.7 g/dL (ref 3.5–5.0)
Alkaline Phosphatase: 56 U/L (ref 38–126)
Anion gap: 6 (ref 5–15)
BUN: 23 mg/dL (ref 8–23)
CO2: 34 mmol/L — ABNORMAL HIGH (ref 22–32)
Calcium: 8.4 mg/dL — ABNORMAL LOW (ref 8.9–10.3)
Chloride: 104 mmol/L (ref 98–111)
Creatinine, Ser: 0.74 mg/dL (ref 0.61–1.24)
GFR, Estimated: >60 mL/min (ref >60)
Glucose, Bld: 123 mg/dL — ABNORMAL HIGH (ref 70–99)
Potassium: 4.1 mmol/L (ref 3.5–5.1)
Sodium: 144 mmol/L (ref 135–145)
Total Bilirubin: 0.8 mg/dL (ref 0.3–1.2)
Total Protein: 6.2 g/dL — ABNORMAL LOW (ref 6.5–8.1)

## 2021-05-27 LAB — TROPONIN I (HIGH SENSITIVITY)
Troponin I (High Sensitivity): 58 ng/L — ABNORMAL HIGH (ref <18)
Troponin I (High Sensitivity): 54 ng/L — ABNORMAL HIGH (ref <18)

## 2021-05-27 LAB — RESP PANEL BY RT-PCR (FLU A&B, COVID) ARPGX2
Influenza A by PCR: NEGATIVE
Influenza B by PCR: NEGATIVE
SARS Coronavirus 2 by RT PCR: NEGATIVE

## 2021-05-27 LAB — BRAIN NATRIURETIC PEPTIDE: B Natriuretic Peptide: 1348.7 pg/mL — ABNORMAL HIGH (ref 0.0–100.0)

## 2021-05-27 MED ORDER — FINASTERIDE 5 MG PO TABS
5.0000 mg | ORAL_TABLET | Freq: Every day | ORAL | Status: DC
  Administered 2021-05-28 – 2021-06-10 (×13): 5 mg via ORAL
  Filled 2021-05-27 (×13): qty 1

## 2021-05-27 MED ORDER — NITROGLYCERIN 2 % TD OINT
1.0000 [in_us] | TOPICAL_OINTMENT | Freq: Four times a day (QID) | TRANSDERMAL | Status: DC
  Administered 2021-05-27 – 2021-05-28 (×3): 1 [in_us] via TOPICAL
  Filled 2021-05-27: qty 1
  Filled 2021-05-27: qty 30

## 2021-05-27 MED ORDER — FUROSEMIDE 10 MG/ML IJ SOLN
40.0000 mg | Freq: Once | INTRAMUSCULAR | Status: AC
  Administered 2021-05-27: 19:00:00 40 mg via INTRAVENOUS
  Filled 2021-05-27: qty 4

## 2021-05-27 MED ORDER — IPRATROPIUM-ALBUTEROL 0.5-2.5 (3) MG/3ML IN SOLN
3.0000 mL | Freq: Once | RESPIRATORY_TRACT | Status: AC
  Administered 2021-05-27: 16:00:00 3 mL via RESPIRATORY_TRACT
  Filled 2021-05-27: qty 3

## 2021-05-27 MED ORDER — ACETAMINOPHEN 650 MG RE SUPP: 650.0000 mg | Freq: Four times a day (QID) | RECTAL | Status: DC | PRN

## 2021-05-27 MED ORDER — ONDANSETRON HCL 4 MG PO TABS: 4.0000 mg | ORAL_TABLET | Freq: Four times a day (QID) | ORAL | Status: DC | PRN

## 2021-05-27 MED ORDER — ONDANSETRON HCL 4 MG/2ML IJ SOLN: 4.0000 mg | Freq: Four times a day (QID) | INTRAMUSCULAR | Status: DC | PRN

## 2021-05-27 MED ORDER — GABAPENTIN 300 MG PO CAPS
300.0000 mg | ORAL_CAPSULE | Freq: Two times a day (BID) | ORAL | Status: DC
  Administered 2021-05-27 – 2021-06-10 (×26): 300 mg via ORAL
  Filled 2021-05-27 (×26): qty 1

## 2021-05-27 MED ORDER — ALBUTEROL SULFATE (2.5 MG/3ML) 0.083% IN NEBU: 2.5000 mg | INHALATION_SOLUTION | RESPIRATORY_TRACT | Status: DC | PRN

## 2021-05-27 MED ORDER — TAMSULOSIN HCL 0.4 MG PO CAPS
0.4000 mg | ORAL_CAPSULE | Freq: Every day | ORAL | Status: DC
  Administered 2021-05-28 – 2021-06-09 (×13): 0.4 mg via ORAL
  Filled 2021-05-27 (×14): qty 1

## 2021-05-27 MED ORDER — METHYLPREDNISOLONE SODIUM SUCC 125 MG IJ SOLR
125.0000 mg | Freq: Once | INTRAMUSCULAR | Status: AC
  Administered 2021-05-27: 15:00:00 125 mg via INTRAVENOUS
  Filled 2021-05-27: qty 2

## 2021-05-27 MED ORDER — IRBESARTAN 300 MG PO TABS
300.0000 mg | ORAL_TABLET | Freq: Once | ORAL | Status: AC
  Administered 2021-05-27: 22:00:00 300 mg via ORAL
  Filled 2021-05-27 (×2): qty 1

## 2021-05-27 MED ORDER — ALBUTEROL SULFATE (2.5 MG/3ML) 0.083% IN NEBU
2.5000 mg | INHALATION_SOLUTION | Freq: Once | RESPIRATORY_TRACT | Status: AC
  Administered 2021-05-27: 14:00:00 2.5 mg via RESPIRATORY_TRACT
  Filled 2021-05-27: qty 3

## 2021-05-27 MED ORDER — ENSURE ENLIVE PO LIQD
237.0000 mL | Freq: Two times a day (BID) | ORAL | Status: DC
  Administered 2021-05-28 – 2021-06-09 (×14): 237 mL via ORAL

## 2021-05-27 MED ORDER — ACETAMINOPHEN 325 MG PO TABS
650.0000 mg | ORAL_TABLET | Freq: Four times a day (QID) | ORAL | Status: DC | PRN
  Filled 2021-05-27: qty 2

## 2021-05-27 MED ORDER — ASPIRIN 81 MG PO CHEW
324.0000 mg | CHEWABLE_TABLET | Freq: Once | ORAL | Status: AC
  Administered 2021-05-27: 19:00:00 324 mg via ORAL
  Filled 2021-05-27: qty 4

## 2021-05-27 MED ORDER — ENOXAPARIN SODIUM 40 MG/0.4ML IJ SOSY
40.0000 mg | PREFILLED_SYRINGE | Freq: Every day | INTRAMUSCULAR | Status: DC
  Administered 2021-05-27 – 2021-05-30 (×4): 40 mg via SUBCUTANEOUS
  Filled 2021-05-27 (×4): qty 0.4

## 2021-05-27 MED ORDER — MIRABEGRON ER 25 MG PO TB24
25.0000 mg | ORAL_TABLET | Freq: Every day | ORAL | Status: DC
  Administered 2021-05-28 – 2021-06-10 (×13): 25 mg via ORAL
  Filled 2021-05-27 (×14): qty 1

## 2021-05-27 MED ORDER — FUROSEMIDE 10 MG/ML IJ SOLN
40.0000 mg | Freq: Two times a day (BID) | INTRAMUSCULAR | Status: DC
  Administered 2021-05-28: 09:00:00 40 mg via INTRAVENOUS
  Filled 2021-05-27: qty 4

## 2021-05-27 NOTE — Assessment & Plan Note (Signed)
Admit to cardiac telemetry at [facility].  I think that his acute heart failure is due to severe aortic stenosis.  Patient is hypertensive so I do not think that his severe AS has low flow.  Patient has known moderate to severe aortic stenosis since 2019.  He has been lost to cardiac follow-up.  I think the patient needs another echo and probably expedited TAVR work-up.  Transfer to [facility] for cardiac evaluation.  We will gently diurese him but his AAS is probably very preload dependent.

## 2021-05-27 NOTE — Assessment & Plan Note (Signed)
Patient not on home oxygen.  Continue supplemental oxygen.  Wean as tolerated.

## 2021-05-27 NOTE — ED Provider Notes (Signed)
[facility] DEPT Provider Note   CSN: [id number] Arrival date & time: 05/27/21  1345     History Chief Complaint  Patient presents with   Shortness of Breath    Kyle Lewis is a 75 y.o. male.  HPI Patient reports that he has been getting increasingly short of breath for over a week.  He reports he has had cough has been nonproductive.  He has had no body aches fevers or chills.  No sore throat or URI symptoms.  He reports he is getting pretty short of breath with minimal exertion.  Patient describes 2 courses of antibiotics over the past 2 weeks.  He denies he has been prescribed or told to take any inhalers for his COPD.  He does still smoke.  He has been trying to cut back and is down to half a pack per day.  He reports his wife has noticed that his legs are more swollen over the past week.  Denies calf pain.    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   Abscess of left leg    Adenomatous colon polyp    Adrenal mass (HCC)    2 benign appearing left adrenal adenomas noted on 01/13/15 CT   Allergy    seasonal   Aortic stenosis    mild to moderate by echo and cath in 09/2016   Arthritis    Carotid artery occlusion    Cataract    Chronic back pain    COPD (chronic obstructive pulmonary disease) (HCC)    Coronary artery disease    CVA (cerebral infarction)    Degeneration of cervical intervertebral disc    Diverticulosis    History of back surgery    Rods and Screws in back   History of left knee replacement    Hypertension    Meningioma (HCC)    Nocturia    Peripheral vascular disease ([place])    Ringing in ear    (SLIGHT)   Sleep apnea    wears BIPAP set on 5 to 9   Stroke ([place]) 2013   tia no residual deficit from    Patient Active Problem List   Diagnosis Date Noted   Anxiety 04/08/2020   Body mass index (BMI) 34.0-34.9, adult 04/07/2020   Abnormal gait  07/31/2019   Elevated blood-pressure reading, without diagnosis of hypertension 05/06/2019   Inflammation of sacroiliac joint ([place]) 05/06/2019   COPD GOLD II 04/07/2019   Neck pain 03/25/2019   Exertional dyspnea 02/24/2019   Preop cardiovascular exam 10/24/2018   Cigarette smoker 09/05/2018   CAD (coronary artery disease) 02/01/2018   Moderate aortic stenosis 02/01/2018   Status post AAA (abdominal aortic aneurysm) repair 02/01/2018   Pre-procedure lab exam 01/19/2018   Hyperlipidemia LDL goal <70 01/19/2018   History of craniotomy 01/11/2018   Blister of multiple sites of lower extremity 01/10/2018   Leukocytosis 01/10/2018   Acute kidney injury superimposed on chronic kidney disease ([place]) 01/10/2018   Heat stroke 01/09/2018   Hyperthermia 01/09/2018   Other specified postprocedural states 09/25/2017   Osteoarthritis of right glenohumeral joint 08/14/2017   Lower urinary tract symptoms (LUTS) 08/06/2017   S/P lumbar spinal fusion 07/07/2015   Iliac artery aneurysm, left ([place]) 07/01/2015   Peripheral vascular disease ([place]) 07/01/2015   Ruptured abdominal aortic aneurysm (AAA) 07/01/2015   Degeneration of lumbar intervertebral disc 06/14/2015   Hematuria  01/07/2015   History of right MCA stroke 07/21/2014   OAB (overactive bladder) 01/19/2014   Stroke ([place]) 01/01/2014   Acute renal failure ([place]) 11/08/2013   Dehydration 11/08/2013   Electrolyte and fluid disorder 11/08/2013   Exogenous obesity 11/08/2013   Hypertonicity of bladder 11/08/2013   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   Ileus ([place]) 11/08/2013   Microscopic hematuria 11/08/2013   Nausea with vomiting 11/08/2013   Tobacco use 11/08/2013   Nocturia 11/08/2013   Ventral hernia 11/08/2013   Carotid arterial disease ([place]) 05/23/2012   Hypertension    OSA (obstructive sleep apnea)    Chronic back pain    BPH with obstruction/lower urinary tract symptoms    CVA (cerebral infarction)    Postop Acute blood loss anemia  04/03/2012   Postop Hypokalemia 04/03/2012   OA (osteoarthritis) of knee 04/01/2012   DEPRESSION 03/17/2010   DIVERTICULOSIS, COLON 03/17/2010   FATIGUE 03/17/2010   DYSPHAGIA UNSPECIFIED 03/17/2010   COLONIC POLYPS, ADENOMATOUS, HX OF 03/17/2010    Past Surgical History:  Procedure Laterality Date   ABDOMINAL AORTIC ANEURYSM REPAIR  2019   [facility]   APPENDECTOMY  as child   BACK SURGERY  2018   Rods and Screws in Back lower back   BRAIN MENINGIOMA EXCISION  1991   [facility]igi[facility]   CARPAL TUNNEL RELEASE     left   EYE SURGERY Bilateral    Cataract   IRRIGATION AND DEBRIDEMENT ABSCESS Left 08/25/2020   Procedure: IRRIGATION AND DEBRIDEMENT ABSCESS LEFT LEG;  Surgeon: Evelina Bucy, DPM;  Location: [facility];  Service: Podiatry;  Laterality: Left;   JOINT REPLACEMENT Left 04-01-12   Knee   LEFT HEART CATH AND CORONARY ANGIOGRAPHY N/A 09/15/2016   Procedure: Left Heart Cath and Coronary Angiography;  Surgeon: Nelva Bush, MD;  Location: [facility];  Service: Cardiovascular;  Laterality: N/A;   MAXIMUM ACCESS (MAS)POSTERIOR LUMBAR INTERBODY FUSION (PLIF) 1 LEVEL N/A 07/07/2015   Procedure:  POSTERIOR LUMBAR INTERBODY FUSION (PLIF) Lumbar Four-Five with Pedicle Screw Fixation Lumbar Two-Five;Laminectomy Lumbar Two-Five;  Surgeon: Eustace Moore, MD;  Location: [facility];  Service: Neurosurgery;  Laterality: N/A;   POSTERIOR LUMBAR INTERBODY FUSION (PLIF) Lumbar Four-Five with Pedicle Screw Fixation Lumbar Two-Five;Laminectomy Lumbar Two-Five   RIGHT/LEFT HEART CATH AND CORONARY ANGIOGRAPHY N/A 01/24/2018   Procedure: RIGHT/LEFT HEART CATH AND CORONARY ANGIOGRAPHY;  Surgeon: Nelva Bush, MD;  Location: [facility];  Service: Cardiovascular;  Laterality: N/A;   TONSILLECTOMY  as child   TOTAL KNEE ARTHROPLASTY  04/01/2012   Procedure: TOTAL KNEE ARTHROPLASTY;  Surgeon: Gearlean Alf, MD;  Location: [facility];  Service: Orthopedics;  Laterality: Left;    TRANSURETHRAL RESECTION OF PROSTATE     UMBILICAL HERNIA REPAIR  2011       Family History  Problem Relation Age of Onset   Heart disease Mother        Onset ~43 y/o   Hypertension Mother        Deceased from old age at 85   Hyperlipidemia Mother    Arthritis Mother    Diabetes Father        Deceased from old age at 29   Heart attack Father    Heart disease Father        CABG at age 23   Dementia Father 40   Arthritis Father    Prostate cancer Maternal Grandfather    Colon cancer Neg Hx    Esophageal cancer Neg Hx  Rectal cancer Neg Hx    Stomach cancer Neg Hx     Social History   Tobacco Use   Smoking status: Former    Packs/day: 0.75    Years: 40.00    Pack years: 30.00    Types: Cigarettes    Quit date: 07/20/2019    Years since quitting: 1.8   Smokeless tobacco: Never   Tobacco comments:    1/2 pack a day if that   Vaping Use   Vaping Use: Never used  Substance Use Topics   Alcohol use: Yes    Comment: rare   Drug use: No    Home Medications Prior to Admission medications   Medication Sig Start Date End Date Taking? Authorizing Provider  acetaminophen (TYLENOL) 500 MG tablet Take 500 mg by mouth at bedtime.     [provider]  albuterol (VENTOLIN HFA) 108 (90 Base) MCG/ACT inhaler Inhale 2 puffs into the lungs every 6 (six) hours as needed for wheezing or shortness of breath. 05/05/21   Fargo, Amy E, NP  AZO-CRANBERRY PO Take 2 tablets by mouth at bedtime.    [provider]  b complex vitamins capsule Take 1 capsule by mouth daily.    [provider]  Cholecalciferol (D-3-5) 125 MCG (5000 UT) capsule Take 5,000 Units by mouth daily.    [provider]  clopidogrel (PLAVIX) 75 MG tablet Take 1 tablet (75 mg total) by mouth daily. 12/20/20   [name], MD  dexamethasone (DECADRON) 4 MG tablet Take 1 tablet (4 mg total) by mouth daily for 7 days. 05/20/21 05/27/21  Medina-Vargas, Monina C, NP  finasteride  (PROSCAR) 5 MG tablet Take 1 tablet (5 mg total) by mouth daily. 05/05/21   Fargo, Amy E, NP  gabapentin (NEURONTIN) 300 MG capsule Take 1 capsule (300 mg total) by mouth 2 (two) times daily. 05/05/21   Fargo, Amy E, NP  guaiFENesin 200 MG tablet Take 2 tablets (400 mg total) by mouth 2 (two) times daily. 05/05/21   Fargo, Amy E, NP  loperamide (IMODIUM) 2 MG capsule Take 2 mg by mouth as needed.     [provider]  methocarbamol (ROBAXIN) 500 MG tablet Take 1 tablet (500 mg total) by mouth at bedtime as needed for muscle spasms. 05/05/21   Fargo, Amy E, NP  mirabegron ER (MYRBETRIQ) 25 MG TB24 tablet Take 25 mg by mouth daily.    [provider]  olmesartan (BENICAR) 40 MG tablet Take 1 tablet (40 mg total) by mouth daily. 01/27/21   [name], MD  saccharomyces boulardii (FLORASTOR) 250 MG capsule Take 1 capsule (250 mg total) by mouth 2 (two) times daily for 9 days. 05/20/21 05/29/21  Medina-Vargas, Monina C, NP  tamsulosin (FLOMAX) 0.4 MG CAPS capsule Take 1 capsule (0.4 mg total) by mouth daily after supper. 12/20/20   [name], MD    Allergies    Cefazolin  Review of Systems   Review of Systems 10 systems reviewed and negative except as per HPI Physical Exam Updated Vital Signs BP (!) 179/101    Pulse 74    Temp 97.7 F (36.5 C) (Oral)    Resp 20    Ht 5\' 6"  (1.676 m)    Wt 99.8 kg    SpO2 93%    BMI 35.51 kg/m   Physical Exam Constitutional:      Comments: Mild to moderate increased work of breathing at rest.  Speaking in full sentences.  HENT:     Head: Normocephalic and atraumatic.     Mouth/Throat:     Pharynx: Oropharynx is clear.  Cardiovascular:     Comments: Heart regular.  Do not appreciate gross rub murmur gallop Pulmonary:     Comments: Mild/moderate increased work of breathing.  Breath sounds are soft to the bases.  Occasional crackles appreciated. Abdominal:     General: There is no distension.     Palpations: Abdomen is soft.      Tenderness: There is no abdominal tenderness. There is no guarding.  Musculoskeletal:     Comments: 1+ to 2+ pitting edema bilateral lower extremities.  Patient has erythematous scaly rash on the soles of the feet and around the bottoms of the ankles consistent with tinea pedis.  Skin:    General: Skin is warm and dry.  Neurological:     General: No focal deficit present.     Mental Status: He is oriented to person, place, and time.     Motor: No weakness.     Coordination: Coordination normal.  Psychiatric:        Mood and Affect: Mood normal.    ED Results / Procedures / Treatments   Labs (all labs ordered are listed, but only abnormal results are displayed) Labs Reviewed  COMPREHENSIVE METABOLIC PANEL - Abnormal; Notable for the following components:      Result Value   CO2 34 (*)    Glucose, Bld 123 (*)    Calcium 8.4 (*)    Total Protein 6.2 (*)    All other components within normal limits  CBC WITH DIFFERENTIAL/PLATELET - Abnormal; Notable for the following components:   HCT 52.8 (*)    MCV 101.5 (*)    All other components within normal limits  BRAIN NATRIURETIC PEPTIDE - Abnormal; Notable for the following components:   B Natriuretic Peptide 1,348.7 (*)    All other components within normal limits  TROPONIN I (HIGH SENSITIVITY) - Abnormal; Notable for the following components:   Troponin I (High Sensitivity) 58 (*)    All other components within normal limits  RESP PANEL BY RT-PCR (FLU A&B, COVID) [id number]  TROPONIN I (HIGH SENSITIVITY)    EKG EKG Interpretation  Date/Time:  Friday May 27 2021 17:06:46 EST Ventricular Rate:  88 PR Interval:  180 QRS Duration: 132 QT Interval:  403 QTC Calculation: 488 R Axis:   -62 Text Interpretation: Sinus rhythm Probable left atrial enlargement Nonspecific IVCD with LAD Left ventricular hypertrophy Anterior Q waves, possibly due to LVH voltage change since previous tracing, increased IVCD Confirmed by Charlesetta Shanks  [id number]) on 05/27/2021 7:18:43 PM  Radiology DG Chest 2 View  Result Date: 05/27/2021 CLINICAL DATA:  Shortness of breath, cough EXAM: CHEST - 2 VIEW COMPARISON:  04/07/2019 FINDINGS: Eventration of the right hemidiaphragm. Small right and trace left pleural effusions. Associated bibasilar atelectasis. No frank interstitial edema.  Mild cardiomegaly.  No pneumothorax. IMPRESSION: Mild cardiomegaly with small right and trace left pleural effusions. Associated bibasilar atelectasis. No frank interstitial edema. Electronically Signed   By: Julian Hy M.D.   On: 05/27/2021 14:30    Procedures Procedures  CRITICAL CARE Performed by: Charlesetta Shanks   Total critical care time: 30 minutes  Critical care time was exclusive of separately billable procedures and treating other patients.  Critical care was necessary to treat or prevent imminent or life-threatening deterioration.  Critical care was time spent personally by me on the following activities: development of treatment plan with  patient and/or surrogate as well as nursing, discussions with consultants, evaluation of patient's response to treatment, examination of patient, obtaining history from patient or surrogate, ordering and performing treatments and interventions, ordering and review of laboratory studies, ordering and review of radiographic studies, pulse oximetry and re-evaluation of patient's condition.  Medications Ordered in ED Medications  nitroGLYCERIN (NITROGLYN) 2 % ointment 1 inch (1 inch Topical Given 05/27/21 1906)  irbesartan (AVAPRO) tablet 300 mg (has no administration in time range)  methylPREDNISolone sodium succinate (SOLU-MEDROL) 125 mg/2 mL injection 125 mg (125 mg Intravenous Given 05/27/21 1459)  albuterol (PROVENTIL) (2.5 MG/3ML) 0.083% nebulizer solution 2.5 mg (2.5 mg Nebulization Given 05/27/21 1426)  ipratropium-albuterol (DUONEB) 0.5-2.5 (3) MG/3ML nebulizer solution 3 mL (3 mLs Nebulization Given  05/27/21 1620)  furosemide (LASIX) injection 40 mg (40 mg Intravenous Given 05/27/21 1904)  aspirin chewable tablet 324 mg (324 mg Oral Given 05/27/21 1901)    ED Course  I have reviewed the triage vital signs and the nursing notes.  Pertinent labs & imaging results that were available during my care of the patient were reviewed by me and considered in my medical decision making (see chart for details).    MDM Rules/Calculators/A&P                         Consult:Dr. Bridgett Larsson for admission.    Patient presents about 2 weeks of worsening shortness of breath.  He has unsuccessfully been treated with antibiotics for COPD exacerbation and/or pneumonia.  Symptoms have continued to progress.  He is short of breath with minimal activity. Physical exam is suggestive of volume overload.  He does have pitting edema and crackles on exam.  Patient is hypertensive.  BNP was significant elevation at 1300 and EKG showing increased LVH since previous.This is consistent with new onset CHF for the patient.  Prior echo showed moderate aortic stenosis without recommendations for urgent repair.  Plan for admission for diuresis, blood pressure management and further diagnostic evaluation.  Final Clinical Impression(s) / ED Diagnoses Final diagnoses:  Acute congestive heart failure, unspecified heart failure type [facility])    Rx / [place] Orders ED Discharge Orders     None        Charlesetta Shanks, MD 05/27/21 2024

## 2021-05-27 NOTE — Telephone Encounter (Signed)
Faythe Dingwall, wife called and stated that patient was much worse. Stated that he is SOB and can hardly speak a sentence. Stated that she is on O2 and last night she placed him on her O2 at 3lpm and it helped some. Power is off now and patient is struggling to breath again.   Stated that is SpO2 is 58 and Pulse is 108.   Wife stated that patient is not "with It" and not his normal self. Very cold and not his Normal Color and has extremity swelling. I hear patient in the background struggling with a sentence.   I instructed wife to call 911 to have patient evaluated. Stated that patient is refusing to go to the Hospital. I explained that it was important to get him medical help due to his SOB and low Oxygen level and swelling.  She agreed/promised to Call 911. (Forwarded to Dinah due to Dr. Sabra Heck out of office)

## 2021-05-27 NOTE — Assessment & Plan Note (Signed)
Patient had an echo back in August 2019 from [facility].  At that point his max pressure gradient was 68 mmHg with a mean gradient of 43 mm.  Calculated valve area then was 0.66 cm.  His echo performed here at [facility] back in 2019 showed a valve area of 1.32 cm, mean gradient of 32 mm and a peak gradient of 54 mm.  Patient will need another echocardiogram.  Potentially TEE with 3D reconstruction.  I think he needs to expedite TAVR work-up.  Careful with his diuresis as he is likely preload dependent.

## 2021-05-27 NOTE — ED Notes (Addendum)
ED TO INPATIENT HANDOFF REPORT  ED Nurse Name and Phone #: [phone] Erick Colace, RN   S Name/Age/Gender Kyle Lewis 75 y.o. male Room/Bed: WA18/WA18  Code Status   Code Status: Full Code  Home/SNF/Other Home Patient oriented to: self, place, time, and situation Is this baseline? Yes   Triage Complete: Triage complete  Chief Complaint Acute CHF (congestive heart failure) ([place]) [I50.9]  Triage Note Per EMS-is currently on antibiotic for his COPD-states he has been SOB for ! Week-was given a dueoneb and albuterol in route-patient states it helped-   Allergies Allergies  Allergen Reactions   Cefazolin Rash and Other (See Comments)    The patient had surgery and was given cefazolin intraop. ~ 10 days later he developed a rash confirmed by biopsy to be consistent w/ drug eruption. We cannot know for sure, but this is the most likely agent.      Level of Care/Admitting Diagnosis ED Disposition     ED Disposition  Admit   Condition  --   Comment  Hospital Area: [place] [[id number]]  Level of Care: Telemetry Cardiac [[id number]]  May admit patient to [facility] or [facility] if equivalent level of care is available:: No  Covid Evaluation: Confirmed COVID Negative  Diagnosis: Acute CHF (congestive heart failure) [facility]) [[id number]]  Admitting Physician: Bridgett Larsson, [place]  Attending Physician: Bridgett Larsson, ERIC [[id number]]  Estimated length of stay: 3 - 4 days  Certification:: I certify this patient will need inpatient services for at least 2 midnights          B Medical/Surgery History 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   Abscess of left leg    Acute kidney injury superimposed on chronic kidney disease ([place]) 01/10/2018   Adenomatous colon polyp    Adrenal mass ([place])    2 benign appearing left adrenal adenomas noted on 01/13/15 CT   Allergy    seasonal   Aortic stenosis    mild to  moderate by echo and cath in 09/2016   Arthritis    Carotid artery occlusion    Cataract    Chronic back pain    COPD (chronic obstructive pulmonary disease) ([place])    Coronary artery disease    CVA (cerebral infarction)    Degeneration of cervical intervertebral disc    Diverticulosis    History of back surgery    Rods and Screws in back   History of left knee replacement    Hypertension    Meningioma ([place])    Nocturia    Peripheral vascular disease ([place])    Ringing in ear    ([place])   Ruptured abdominal aortic aneurysm (AAA) 07/01/2015   Formatting of this note might be different from the original. Overview:  Last evaluation by ultrasounds with diameter of 5 cm follow-up by Dr. Oneida Alar last seen in June 10, 2015 Formatting of this note might be different from the original. Last evaluation by ultrasounds with diameter of 5 cm follow-up by Dr. Oneida Alar last seen in June 10, 2015   Sleep apnea    wears BIPAP set on 5 to 9   Stroke [facility]) 2013   tia no residual deficit from   Past Surgical History:  Procedure Laterality Date   ABDOMINAL AORTIC ANEURYSM REPAIR  2019   [place]  as child   BACK SURGERY  2018  Rods and Screws in Back lower back   BRAIN MENINGIOMA EXCISION  1991   menigioma   CARPAL TUNNEL RELEASE     left   EYE SURGERY Bilateral    Cataract   IRRIGATION AND DEBRIDEMENT ABSCESS Left 08/25/2020   Procedure: IRRIGATION AND DEBRIDEMENT ABSCESS LEFT LEG;  Surgeon: Evelina Bucy, DPM;  Location: [place];  Service: Podiatry;  Laterality: Left;   JOINT REPLACEMENT Left 04-01-12   Knee   LEFT HEART CATH AND CORONARY ANGIOGRAPHY N/A 09/15/2016   Procedure: Left Heart Cath and Coronary Angiography;  Surgeon: Nelva Bush, MD;  Location: [place] [facility];  Service: Cardiovascular;  Laterality: N/A;   MAXIMUM ACCESS (MAS)POSTERIOR LUMBAR INTERBODY FUSION (PLIF) 1 LEVEL N/A 07/07/2015   Procedure:  POSTERIOR LUMBAR INTERBODY FUSION  (PLIF) Lumbar Four-Five with Pedicle Screw Fixation Lumbar Two-Five;Laminectomy Lumbar Two-Five;  Surgeon: Eustace Moore, MD;  Location: [facility];  Service: Neurosurgery;  Laterality: N/A;   POSTERIOR LUMBAR INTERBODY FUSION (PLIF) Lumbar Four-Five with Pedicle Screw Fixation Lumbar Two-Five;Laminectomy Lumbar Two-Five   RIGHT/LEFT HEART CATH AND CORONARY ANGIOGRAPHY N/A 01/24/2018   Procedure: RIGHT/LEFT HEART CATH AND CORONARY ANGIOGRAPHY;  Surgeon: Nelva Bush, MD;  Location: [place] [facility];  Service: Cardiovascular;  Laterality: N/A;   TONSILLECTOMY  as child   TOTAL KNEE ARTHROPLASTY  04/01/2012   Procedure: TOTAL KNEE ARTHROPLASTY;  Surgeon: Gearlean Alf, MD;  Location: [facility];  Service: Orthopedics;  Laterality: Left;   TRANSURETHRAL RESECTION OF PROSTATE     UMBILICAL HERNIA REPAIR  2011     A IV Location/Drains/Wounds Patient Lines/Drains/Airways Status     Active Line/Drains/Airways     Name Placement date Placement time Site Days   Peripheral IV 05/27/21 18 G 1" Right Antecubital 05/27/21  1904  Antecubital  less than 1   Incision (Closed) 07/07/15 Back 07/07/15  1748  -- 2151   Incision (Closed) 08/25/20 Leg Left 08/25/20  1200  -- 275   Wound 05/04/12 Skin tear Arm Left Pt. notified staff that he had skin tear from coming out of the bathroom- the coat handler on tthe inside door. 05/04/12  1500  Arm  3310            Intake/Output Last 24 hours No intake or output data in the 24 hours ending 05/27/21 2154  Labs/Imaging Results for orders placed or performed during the hospital encounter of 05/27/21 (from the past 48 hour(s))  Resp Panel by RT-PCR (Flu A&B, Covid) Nasopharyngeal Swab     Status: None   Collection Time: 05/27/21  2:08 PM   Specimen: Nasopharyngeal Swab; Nasopharyngeal(NP) swabs in vial transport medium  Result Value Ref Range   SARS Coronavirus 2 by RT PCR NEGATIVE NEGATIVE    Comment: (NOTE) SARS-CoV-2 target nucleic acids are NOT  DETECTED.  The SARS-CoV-2 RNA is generally detectable in upper respiratory specimens during the acute phase of infection. The lowest concentration of SARS-CoV-2 viral copies this assay can detect is 138 copies/mL. A negative result does not preclude SARS-Cov-2 infection and should not be used as the sole basis for treatment or other patient management decisions. A negative result may occur with  improper specimen collection/handling, submission of specimen other than nasopharyngeal swab, presence of viral mutation(s) within the areas targeted by this assay, and inadequate number of viral copies(<138 copies/mL). A negative result must be combined with clinical observations, patient history, and epidemiological information. The expected result is Negative.  Fact Sheet for Patients:  EntrepreneurPulse.com.au  Fact Sheet for Healthcare Providers:  IncredibleEmployment.be  This test is no t yet approved or cleared by the [place] FDA and  has been authorized for detection and/or diagnosis of SARS-CoV-2 by FDA under an Emergency Use Authorization (EUA). This EUA will remain  in effect (meaning this test can be used) for the duration of the COVID-19 declaration under Section 564(b)(1) of the Act, 21 U.S.C.section 360bbb-3(b)(1), unless the authorization is terminated  or revoked sooner.       Influenza A by PCR NEGATIVE NEGATIVE   Influenza B by PCR NEGATIVE NEGATIVE    Comment: (NOTE) The Xpert Xpress SARS-CoV-2/FLU/RSV plus assay is intended as an aid in the diagnosis of influenza from Nasopharyngeal swab specimens and should not be used as a sole basis for treatment. Nasal washings and aspirates are unacceptable for Xpert Xpress SARS-CoV-2/FLU/RSV testing.  Fact Sheet for Patients: EntrepreneurPulse.com.au  Fact Sheet for Healthcare Providers: IncredibleEmployment.be  This test is not yet approved or  cleared by the [place] FDA and has been authorized for detection and/or diagnosis of SARS-CoV-2 by FDA under an Emergency Use Authorization (EUA). This EUA will remain in effect (meaning this test can be used) for the duration of the COVID-19 declaration under Section 564(b)(1) of the Act, 21 U.S.C. section 360bbb-3(b)(1), unless the authorization is terminated or revoked.  Performed at [facility], [place] [address] [place], [place] [address]   Comprehensive metabolic panel     Status: Abnormal   Collection Time: 05/27/21  2:20 PM  Result Value Ref Range   Sodium 144 135 - 145 mmol/L   Potassium 4.1 3.5 - 5.1 mmol/L   Chloride 104 98 - 111 mmol/L   CO2 34 (H) 22 - 32 mmol/L   Glucose, Bld 123 (H) 70 - 99 mg/dL    Comment: Glucose reference range applies only to samples taken after fasting for at least 8 hours.   BUN 23 8 - 23 mg/dL   Creatinine, Ser 0.74 0.61 - 1.24 mg/dL   Calcium 8.4 (L) 8.9 - 10.3 mg/dL   Total Protein 6.2 (L) 6.5 - 8.1 g/dL   Albumin 3.7 3.5 - 5.0 g/dL   AST 19 15 - 41 U/L   ALT 31 0 - 44 U/L   Alkaline Phosphatase 56 38 - 126 U/L   Total Bilirubin 0.8 0.3 - 1.2 mg/dL   GFR, Estimated >60 >60 mL/min    Comment: (NOTE) Calculated using the CKD-EPI Creatinine Equation (2021)    Anion gap 6 5 - 15    Comment: Performed at [facility], [place] [address], [place], [place] [address]  CBC with Differential     Status: Abnormal   Collection Time: 05/27/21  2:20 PM  Result Value Ref Range   WBC 7.7 4.0 - 10.5 K/uL   RBC 5.20 4.22 - 5.81 MIL/uL   Hemoglobin 16.6 13.0 - 17.0 g/dL   HCT 52.8 (H) 39.0 - 52.0 %   MCV 101.5 (H) 80.0 - 100.0 fL   MCH 31.9 26.0 - 34.0 pg   MCHC 31.4 30.0 - 36.0 g/dL   RDW 14.5 11.5 - 15.5 %   Platelets 186 150 - 400 K/uL   nRBC 0.0 0.0 - 0.2 %   Neutrophils Relative % 80 %   Neutro Abs 6.1 1.7 - 7.7 K/uL   Lymphocytes Relative 12 %   Lymphs Abs 0.9 0.7 - 4.0 K/uL   Monocytes Relative 7  %   Monocytes Absolute  0.5 0.1 - 1.0 K/uL   Eosinophils Relative 0 %   Eosinophils Absolute 0.0 0.0 - 0.5 K/uL   Basophils Relative 0 %   Basophils Absolute 0.0 0.0 - 0.1 K/uL   Immature Granulocytes 1 %   Abs Immature Granulocytes 0.06 0.00 - 0.07 K/uL    Comment: Performed at [facility], [place] [address] [place], [place] [address]  Brain natriuretic peptide     Status: Abnormal   Collection Time: 05/27/21  2:20 PM  Result Value Ref Range   B Natriuretic Peptide 1,348.7 (H) 0.0 - 100.0 pg/mL    Comment: Performed at [facility], [place] [address], [place], [place] [address]  Troponin I (High Sensitivity)     Status: Abnormal   Collection Time: 05/27/21  2:20 PM  Result Value Ref Range   Troponin I (High Sensitivity) 58 (H) <18 ng/L    Comment: (NOTE) Elevated high sensitivity troponin I (hsTnI) values and significant  changes across serial measurements may suggest ACS but many other  chronic and acute conditions are known to elevate hsTnI results.  Refer to the "Links" section for chest pain algorithms and additional  guidance. Performed at [facility], [place] [address] [place], [place] [address]   Troponin I (High Sensitivity)     Status: Abnormal   Collection Time: 05/27/21  7:04 PM  Result Value Ref Range   Troponin I (High Sensitivity) 54 (H) <18 ng/L    Comment: (NOTE) Elevated high sensitivity troponin I (hsTnI) values and significant  changes across serial measurements may suggest ACS but many other  chronic and acute conditions are known to elevate hsTnI results.  Refer to the "Links" section for chest pain algorithms and additional  guidance. Performed at [facility], [place] [address], [place], [place] [address]    DG Chest 2 View  Result Date: 05/27/2021 CLINICAL DATA:  Shortness of breath, cough EXAM: CHEST - 2 VIEW COMPARISON:  04/07/2019 FINDINGS: Eventration of the right hemidiaphragm.  Small right and trace left pleural effusions. Associated bibasilar atelectasis. No frank interstitial edema.  Mild cardiomegaly.  No pneumothorax. IMPRESSION: Mild cardiomegaly with small right and trace left pleural effusions. Associated bibasilar atelectasis. No frank interstitial edema. Electronically Signed   By: Julian Hy M.D.   On: 05/27/2021 14:30    Pending Labs [employer] (From admission, onward)     Start     Ordered   Signed and Held  CBC with Differential/Platelet  Tomorrow morning,   R        Signed and Held   Signed and Held  Comprehensive metabolic panel  Tomorrow morning,   R        Signed and Held   Signed and Held  Magnesium  Tomorrow morning,   R        Signed and Held   Signed and Held  Creatinine, serum  (enoxaparin (LOVENOX)    CrCl >/= 30 ml/min)  Once,   R       Comments: Baseline for enoxaparin therapy IF NOT ALREADY DRAWN.    Signed and Held            Vitals/Pain Today's Vitals   05/27/21 1730 05/27/21 1810 05/27/21 2100 05/27/21 2138  BP: (!) 187/108 (!) 179/101 (!) 150/94 (!) 176/104  Pulse: 93 74  77  Resp: (!) 21 20 20  (!) 24  Temp:      TempSrc:      SpO2: 90% 93%  94%  Weight:      Height:      PainSc:        Isolation Precautions No active isolations  Medications Medications  nitroGLYCERIN (NITROGLYN) 2 % ointment 1 inch (1 inch Topical Given 05/27/21 1906)  methylPREDNISolone sodium succinate (SOLU-MEDROL) 125 mg/2 mL injection 125 mg (125 mg Intravenous Given 05/27/21 1459)  albuterol (PROVENTIL) (2.5 MG/3ML) 0.083% nebulizer solution 2.5 mg (2.5 mg Nebulization Given 05/27/21 1426)  ipratropium-albuterol (DUONEB) 0.5-2.5 (3) MG/3ML nebulizer solution 3 mL (3 mLs Nebulization Given 05/27/21 1620)  furosemide (LASIX) injection 40 mg (40 mg Intravenous Given 05/27/21 1904)  aspirin chewable tablet 324 mg (324 mg Oral Given 05/27/21 1901)  irbesartan (AVAPRO) tablet 300 mg (300 mg Oral Given 05/27/21 2138)     Mobility walks with person assist Low fall risk   Focused Assessments    R Recommendations: See Admitting Provider Note  Report given to:   Additional Notes:

## 2021-05-27 NOTE — ED Notes (Signed)
[facility] contacted, transport to [facility] arranged for patient.

## 2021-05-27 NOTE — Assessment & Plan Note (Signed)
Patient advised to stop smoking 

## 2021-05-27 NOTE — Telephone Encounter (Signed)
I agree with sending patient to ED

## 2021-05-27 NOTE — Telephone Encounter (Signed)
Called wife to check on patient. Patient was taken to [facility]. EMS checked patient's O2 and it was 56% and they placed him on 15lpm of Oxygen.  Wife was very emotional and worried about patient because she cannot go to hospital with patient due to having Leukemia herself.

## 2021-05-27 NOTE — H&P (Signed)
History and Physical    Kyle Lewis OIZ:[id number] DOB: Dec 07, 1945 DOA: 05/27/2021  PCP: [name], MD   Patient coming from: Home  I have personally briefly reviewed patient's old medical records in [place]  Chief complaint: Shortness of breath History of present illness: 75 year old white male with a history of COPD, OSA, hypertension, prior history of AAA repair presents to the hospital today with worsening shortness of breath.  Patient been treated about 7 to 10 days ago with prednisone, antibiotics for presumed COPD exacerbation.  Patient 's wife called his PCP today due to patient having hypoxia at home with reported room air sats of 58%.  911 was activated.  On arrival sats were 94% on 4 L.  Blood pressure 175/95.  Patient denies any recent fever or chills.  He has been short of breath for 7 to 10 days.  He has had an occasional cough.  Patient states that his wife noticed ankle swelling the last 2 to 3 days.  He does not weigh himself.  He has no prior history of congestive heart failure.  Lab evaluation BNP was elevated to 1348.  No prior other BNP's are available in EMR.  COVID-negative.  Flu negative.  Serum CO2 34, BUN 23, creatinine 0.7  CBC white count 7.7, hemoglobin 16.6, platelets 186.  Chest x-ray demonstrated mild cardiomegaly.  Some trace effusions.  No frank edema.  [facility] hospitalist contacted for admission.   ED Course: BNP elevated. CXR without pulmonary edema. O2 sats 90% on 4 L  Review of Systems:  Review of Systems  Constitutional:  Positive for malaise/fatigue. Negative for chills and fever.  HENT: Negative.    Eyes: Negative.   Respiratory:  Positive for shortness of breath.   Cardiovascular:  Positive for leg swelling. Negative for chest pain.  Gastrointestinal: Negative.   Genitourinary: Negative.   Musculoskeletal: Negative.   Skin:        Bilateral ankle edema for 2-3 days  Neurological: Negative.   Endo/Heme/Allergies:  Negative.   Psychiatric/Behavioral: Negative.    All other systems reviewed and are negative.  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   Abscess of left leg    Acute kidney injury superimposed on chronic kidney disease ([place]) 01/10/2018   Adenomatous colon polyp    Adrenal mass (HCC)    2 benign appearing left adrenal adenomas noted on 01/13/15 CT   Allergy    seasonal   Aortic stenosis    mild to moderate by echo and cath in 09/2016   Arthritis    Carotid artery occlusion    Cataract    Chronic back pain    COPD (chronic obstructive pulmonary disease) (HCC)    Coronary artery disease    CVA (cerebral infarction)    Degeneration of cervical intervertebral disc    Diverticulosis    History of back surgery    Rods and Screws in back   History of left knee replacement    Hypertension    Meningioma (HCC)    Nocturia    Peripheral vascular disease (HCC)    Ringing in ear    (SLIGHT)   Ruptured abdominal aortic aneurysm (AAA) 07/01/2015   Formatting of this note might be different from the original. Overview:  Last evaluation by ultrasounds with diameter of 5 cm follow-up by Dr. Oneida Alar last seen in June 10, 2015 Formatting of this note  might be different from the original. Last evaluation by ultrasounds with diameter of 5 cm follow-up by Dr. Oneida Alar last seen in June 10, 2015   Sleep apnea    wears BIPAP set on 5 to 9   Stroke [facility]) 2013   tia no residual deficit from    Past Surgical History:  Procedure Laterality Date   ABDOMINAL AORTIC ANEURYSM REPAIR  2019   [place]  as child   BACK SURGERY  2018   Rods and Screws in Back lower back   BRAIN MENINGIOMA EXCISION  1991   menigioma   CARPAL TUNNEL RELEASE     left   EYE SURGERY Bilateral    Cataract   IRRIGATION AND DEBRIDEMENT ABSCESS Left 08/25/2020   Procedure: IRRIGATION AND DEBRIDEMENT ABSCESS LEFT LEG;  Surgeon: Evelina Bucy, DPM;  Location: [place];  Service: Podiatry;  Laterality: Left;   JOINT REPLACEMENT Left 04-01-12   Knee   LEFT HEART CATH AND CORONARY ANGIOGRAPHY N/A 09/15/2016   Procedure: Left Heart Cath and Coronary Angiography;  Surgeon: Nelva Bush, MD;  Location: [place] [facility];  Service: Cardiovascular;  Laterality: N/A;   MAXIMUM ACCESS (MAS)POSTERIOR LUMBAR INTERBODY FUSION (PLIF) 1 LEVEL N/A 07/07/2015   Procedure:  POSTERIOR LUMBAR INTERBODY FUSION (PLIF) Lumbar Four-Five with Pedicle Screw Fixation Lumbar Two-Five;Laminectomy Lumbar Two-Five;  Surgeon: Eustace Moore, MD;  Location: [place] [facility];  Service: Neurosurgery;  Laterality: N/A;   POSTERIOR LUMBAR INTERBODY FUSION (PLIF) Lumbar Four-Five with Pedicle Screw Fixation Lumbar Two-Five;Laminectomy Lumbar Two-Five   RIGHT/LEFT HEART CATH AND CORONARY ANGIOGRAPHY N/A 01/24/2018   Procedure: RIGHT/LEFT HEART CATH AND CORONARY ANGIOGRAPHY;  Surgeon: Nelva Bush, MD;  Location: [place] [facility];  Service: Cardiovascular;  Laterality: N/A;   TONSILLECTOMY  as child   TOTAL KNEE ARTHROPLASTY  04/01/2012   Procedure: TOTAL KNEE ARTHROPLASTY;  Surgeon: Gearlean Alf, MD;  Location: [facility];  Service: Orthopedics;  Laterality: Left;   TRANSURETHRAL RESECTION OF PROSTATE     UMBILICAL HERNIA REPAIR  2011     reports that he quit smoking about 22 months ago. His smoking use included cigarettes. He has a 30.00 pack-year smoking history. He has never used smokeless tobacco. He reports current alcohol use. He reports that he does not use drugs.  Allergies  Allergen Reactions   Cefazolin Rash and Other (See Comments)    The patient had surgery and was given cefazolin intraop. ~ 10 days later he developed a rash confirmed by biopsy to be consistent w/ drug eruption. We cannot know for sure, but this is the most likely agent.      Family History  Problem Relation Age of Onset   Heart disease Mother        Onset  ~23 y/o   Hypertension Mother        Deceased from old age at 38   Hyperlipidemia Mother    Arthritis Mother    Diabetes Father        Deceased from old age at 54   Heart attack Father    Heart disease Father        CABG at age 84   Dementia Father 48   Arthritis Father    Prostate cancer Maternal Grandfather    Colon cancer Neg Hx    Esophageal cancer Neg Hx    Rectal cancer Neg Hx    Stomach cancer Neg Hx     Prior to Admission  medications   Medication Sig Start Date End Date Taking? Authorizing Provider  acetaminophen (TYLENOL) 500 MG tablet Take 500 mg by mouth at bedtime.     [provider]  albuterol (VENTOLIN HFA) 108 (90 Base) MCG/ACT inhaler Inhale 2 puffs into the lungs every 6 (six) hours as needed for wheezing or shortness of breath. 05/05/21   Fargo, Amy E, NP  AZO-CRANBERRY PO Take 2 tablets by mouth at bedtime.    [provider]  b complex vitamins capsule Take 1 capsule by mouth daily.    [provider]  Cholecalciferol (D-3-5) 125 MCG (5000 UT) capsule Take 5,000 Units by mouth daily.    [provider]  clopidogrel (PLAVIX) 75 MG tablet Take 1 tablet (75 mg total) by mouth daily. 12/20/20   [name], MD  dexamethasone (DECADRON) 4 MG tablet Take 1 tablet (4 mg total) by mouth daily for 7 days. 05/20/21 05/27/21  Medina-Vargas, Monina C, NP  finasteride (PROSCAR) 5 MG tablet Take 1 tablet (5 mg total) by mouth daily. 05/05/21   Fargo, Amy E, NP  gabapentin (NEURONTIN) 300 MG capsule Take 1 capsule (300 mg total) by mouth 2 (two) times daily. 05/05/21   Fargo, Amy E, NP  guaiFENesin 200 MG tablet Take 2 tablets (400 mg total) by mouth 2 (two) times daily. 05/05/21   Fargo, Amy E, NP  loperamide (IMODIUM) 2 MG capsule Take 2 mg by mouth as needed.     [provider]  methocarbamol (ROBAXIN) 500 MG tablet Take 1 tablet (500 mg total) by mouth at bedtime as needed for muscle spasms. 05/05/21   Fargo, Amy E, NP   mirabegron ER (MYRBETRIQ) 25 MG TB24 tablet Take 25 mg by mouth daily.    [provider]  olmesartan (BENICAR) 40 MG tablet Take 1 tablet (40 mg total) by mouth daily. 01/27/21   [name], MD  saccharomyces boulardii (FLORASTOR) 250 MG capsule Take 1 capsule (250 mg total) by mouth 2 (two) times daily for 9 days. 05/20/21 05/29/21  Medina-Vargas, Monina C, NP  tamsulosin (FLOMAX) 0.4 MG CAPS capsule Take 1 capsule (0.4 mg total) by mouth daily after supper. 12/20/20   [name], MD    Physical Exam: Vitals:   05/27/21 1620 05/27/21 1700 05/27/21 1730 05/27/21 1810  BP: (!) 158/113 (!) 182/95 (!) 187/108 (!) 179/101  Pulse: 80 85 93 74  Resp: (!) 21 20 (!) 21 20  Temp:      TempSrc:      SpO2: 92% 92% 90% 93%  Weight:      Height:        Physical Exam Vitals and nursing note reviewed.  Constitutional:      General: He is not in acute distress.    Appearance: Normal appearance. He is obese. He is not ill-appearing, toxic-appearing or diaphoretic.  HENT:     Head: Normocephalic and atraumatic.     Nose: Nose normal. No rhinorrhea.  Eyes:     General:        Right eye: No discharge.        Left eye: No discharge.  Cardiovascular:     Rate and Rhythm: Normal rate and regular rhythm.     Heart sounds: Murmur heard.     Comments: Loud harsh 3 out of 6 systolic ejection murmur of the left upper sternal border. Pulmonary:     Breath sounds: Decreased breath sounds present. No wheezing or rhonchi.  Abdominal:  General: Bowel sounds are normal. There is no distension.     Tenderness: There is no abdominal tenderness. There is no guarding or rebound.  Musculoskeletal:     Right ankle: Swelling present.     Left ankle: Swelling present.     Comments: +1 pitting ankle edema bilaterally.  Skin:    General: Skin is warm and dry.     Capillary Refill: Capillary refill takes less than 2 seconds.  Neurological:     General: No focal deficit present.      Mental Status: He is alert and oriented to person, place, and time.     Labs on Admission: I have personally reviewed following labs and imaging studies  CBC: Recent Labs  Lab 05/27/21 1420  WBC 7.7  NEUTROABS 6.1  HGB 16.6  HCT 52.8*  MCV 101.5*  PLT 970   Basic Metabolic Panel: Recent Labs  Lab 05/27/21 1420  NA 144  K 4.1  CL 104  CO2 34*  GLUCOSE 123*  BUN 23  CREATININE 0.74  CALCIUM 8.4*   GFR: Estimated Creatinine Clearance: 89.6 mL/min (by C-G formula based on SCr of 0.74 mg/dL). Liver Function Tests: Recent Labs  Lab 05/27/21 1420  AST 19  ALT 31  ALKPHOS 56  BILITOT 0.8  PROT 6.2*  ALBUMIN 3.7   No results for input(s): LIPASE, AMYLASE in the last 168 hours. No results for input(s): AMMONIA in the last 168 hours. Coagulation Profile: No results for input(s): INR, PROTIME in the last 168 hours. Cardiac Enzymes: No results for input(s): CKTOTAL, CKMB, CKMBINDEX, TROPONINI in the last 168 hours. BNP (last 3 results) No results for input(s): PROBNP in the last 8760 hours. HbA1C: No results for input(s): HGBA1C in the last 72 hours. CBG: No results for input(s): GLUCAP in the last 168 hours. Lipid Profile: No results for input(s): CHOL, HDL, LDLCALC, TRIG, CHOLHDL, LDLDIRECT in the last 72 hours. Thyroid Function Tests: No results for input(s): TSH, T4TOTAL, FREET4, T3FREE, THYROIDAB in the last 72 hours. Anemia Panel: No results for input(s): VITAMINB12, FOLATE, FERRITIN, TIBC, IRON, RETICCTPCT in the last 72 hours. Urine analysis:    Component Value Date/Time   COLORURINE YELLOW 03/25/2012 1501   APPEARANCEUR CLEAR 03/25/2012 1501   LABSPEC 1.015 03/25/2012 1501   PHURINE 5.0 03/25/2012 1501   GLUCOSEU NEGATIVE 03/25/2012 1501   HGBUR NEGATIVE 03/25/2012 1501   BILIRUBINUR NEGATIVE 03/25/2012 1501   KETONESUR NEGATIVE 03/25/2012 1501   PROTEINUR NEGATIVE 03/25/2012 1501   UROBILINOGEN 0.2 03/25/2012 1501   NITRITE NEGATIVE 03/25/2012  1501   LEUKOCYTESUR NEGATIVE 03/25/2012 1501    Radiological Exams on Admission: I have personally reviewed images DG Chest 2 View  Result Date: 05/27/2021 CLINICAL DATA:  Shortness of breath, cough EXAM: CHEST - 2 VIEW COMPARISON:  04/07/2019 FINDINGS: Eventration of the right hemidiaphragm. Small right and trace left pleural effusions. Associated bibasilar atelectasis. No frank interstitial edema.  Mild cardiomegaly.  No pneumothorax. IMPRESSION: Mild cardiomegaly with small right and trace left pleural effusions. Associated bibasilar atelectasis. No frank interstitial edema. Electronically Signed   By: Julian Hy M.D.   On: 05/27/2021 14:30    EKG: I have personally reviewed EKG: NSR   Assessment/Plan Principal Problem:   Acute CHF (congestive heart failure) (HCC) Active Problems:   Severe aortic stenosis by prior echocardiogram   Acute respiratory failure with hypoxia (HCC)   OSA (obstructive sleep apnea)   Exogenous obesity   Tobacco use    Acute CHF (congestive heart  failure) ([place]) Admit to cardiac telemetry at [facility].  I think that his acute heart failure is due to severe aortic stenosis.  Patient is hypertensive so I do not think that his severe AS has low flow.  Patient has known moderate to severe aortic stenosis since 2019.  He has been lost to cardiac follow-up.  I think the patient needs another echo and probably expedited TAVR work-up.  Transfer to [facility] for cardiac evaluation.  We will gently diurese him but his AAS is probably very preload dependent.  Severe aortic stenosis by prior echocardiogram Patient had an echo back in August 2019 from [facility].  At that point his max pressure gradient was 68 mmHg with a mean gradient of 43 mm.  Calculated valve area then was 0.66 cm.  His echo performed here at [facility] back in 2019 showed a valve area of 1.32 cm, mean gradient of 32 mm and a peak gradient of 54 mm.  Patient will need another  echocardiogram.  Potentially TEE with 3D reconstruction.  I think he needs to expedite TAVR work-up.  Careful with his diuresis as he is likely preload dependent.  Acute respiratory failure with hypoxia ([place]) Patient not on home oxygen.  Continue supplemental oxygen.  Wean as tolerated.  OSA (obstructive sleep apnea) Chronic.  Continue home BiPAP.  Exogenous obesity Chronic.  Tobacco use Patient advised to stop smoking.  DVT prophylaxis: Lovenox Code Status: Full Code Family Communication: no family at bedside  Disposition Plan: return home  Consults called: none  Admission status: Inpatient, Telemetry bed   Kristopher Oppenheim, DO [facility] 05/27/2021, 7:50 PM

## 2021-05-27 NOTE — Subjective & Objective (Signed)
Chief complaint: Shortness of breath History of present illness: 75 year old white male with a history of COPD, OSA, hypertension, prior history of AAA repair presents to the hospital today with worsening shortness of breath.  Patient been treated about 7 to 10 days ago with prednisone, antibiotics for presumed COPD exacerbation.  Patient 's wife called his PCP today due to patient having hypoxia at home with reported room air sats of 58%.  911 was activated.  On arrival sats were 94% on 4 L.  Blood pressure 175/95.  Patient denies any recent fever or chills.  He has been short of breath for 7 to 10 days.  He has had an occasional cough.  Patient states that his wife noticed ankle swelling the last 2 to 3 days.  He does not weigh himself.  He has no prior history of congestive heart failure.  Lab evaluation BNP was elevated to 1348.  No prior other BNP's are available in EMR.  COVID-negative.  Flu negative.  Serum CO2 34, BUN 23, creatinine 0.7  CBC white count 7.7, hemoglobin 16.6, platelets 186.  Chest x-ray demonstrated mild cardiomegaly.  Some trace effusions.  No frank edema.  [facility] hospitalist contacted for admission.

## 2021-05-27 NOTE — Assessment & Plan Note (Addendum)
Chronic.  Continue home BiPAP.

## 2021-05-27 NOTE — ED Provider Notes (Signed)
Emergency Medicine Provider Triage Evaluation Note  Kyle Lewis , a 75 y.o. male  was evaluated in triage.  Pt complains of shortness of breath.  He says shortness of breath has been present over the last 3 to 4 days.  States that he has been having a cough over the last 2 weeks.  Cough is occasionally producing clear to yellow mucus.  Patient states that his cough is COPD does not normally produce mucus.  Also endorses rhinorrhea and nasal congestion.  Per chart review patient was seen by PCP on 12/16 and treated with 5-day course of azithromycin as well as 7-day course of dexamethasone.  Patient reports that he wears CPAP at night.  Does not use any home oxygen  Review of Systems  Positive: Shortness of breath, productive cough, rhinorrhea, nasal congestion Negative: Fever, chills, chest pain, palpitations, leg swelling or tenderness, abdominal pain, nausea, vomiting, diarrhea, sore throat  Physical Exam  BP (!) 175/95 (BP Location: Left Arm)    Pulse 100    Temp 97.7 F (36.5 C) (Oral)    Resp 16    Ht 5\' 6"  (1.676 m)    Wt 99.8 kg    SpO2 90%    BMI 35.51 kg/m  Gen:   Awake, no distress, on 4 LPM of O2 via nasal cannula Resp:  Normal effort, expiratory wheezing noted throughout with decreased lung sounds MSK:   Moves extremities without difficulty; minimal edema to bilateral lower extremities Other:    Medical Decision Making  Medically screening exam initiated at 2:08 PM.  Appropriate orders placed.  [name] was informed that the remainder of the evaluation will be completed by another provider, this initial triage assessment does not replace that evaluation, and the importance of remaining in the ED until their evaluation is complete.  Due to  wheezing with decreased lung sounds we will give patient Solu-Medrol and albuterol treatment.  Patient states he had improvement in shortness of breath after receiving a DuoNeb with EMS.   Loni Beckwith, PA-C 05/27/21  1411    Varney Biles, MD 05/28/21 1534

## 2021-05-27 NOTE — ED Notes (Signed)
Lab reports adding on troponin and BNP to previous collected labs.

## 2021-05-27 NOTE — ED Triage Notes (Signed)
Per EMS-is currently on antibiotic for his COPD-states he has been SOB for ! Week-was given a dueoneb and albuterol in route-patient states it helped-

## 2021-05-27 NOTE — Assessment & Plan Note (Signed)
Chronic. 

## 2021-05-28 ENCOUNTER — Inpatient Hospital Stay (HOSPITAL_COMMUNITY): Payer: Medicare Other

## 2021-05-28 DIAGNOSIS — I509 Heart failure, unspecified: Secondary | ICD-10-CM | POA: Diagnosis not present

## 2021-05-28 DIAGNOSIS — I5033 Acute on chronic diastolic (congestive) heart failure: Secondary | ICD-10-CM | POA: Diagnosis present

## 2021-05-28 DIAGNOSIS — I350 Nonrheumatic aortic (valve) stenosis: Secondary | ICD-10-CM | POA: Diagnosis not present

## 2021-05-28 DIAGNOSIS — J9601 Acute respiratory failure with hypoxia: Secondary | ICD-10-CM | POA: Diagnosis not present

## 2021-05-28 DIAGNOSIS — I5032 Chronic diastolic (congestive) heart failure: Secondary | ICD-10-CM | POA: Diagnosis present

## 2021-05-28 LAB — COMPREHENSIVE METABOLIC PANEL WITH GFR
ALT: 29 U/L (ref 0–44)
AST: 22 U/L (ref 15–41)
Albumin: 3.3 g/dL — ABNORMAL LOW (ref 3.5–5.0)
Alkaline Phosphatase: 48 U/L (ref 38–126)
Anion gap: 6 (ref 5–15)
BUN: 18 mg/dL (ref 8–23)
CO2: 36 mmol/L — ABNORMAL HIGH (ref 22–32)
Calcium: 7.9 mg/dL — ABNORMAL LOW (ref 8.9–10.3)
Chloride: 97 mmol/L — ABNORMAL LOW (ref 98–111)
Creatinine, Ser: 0.81 mg/dL (ref 0.61–1.24)
GFR, Estimated: >60 mL/min
Glucose, Bld: 210 mg/dL — ABNORMAL HIGH (ref 70–99)
Potassium: 4.1 mmol/L (ref 3.5–5.1)
Sodium: 139 mmol/L (ref 135–145)
Total Bilirubin: 1.1 mg/dL (ref 0.3–1.2)
Total Protein: 5.5 g/dL — ABNORMAL LOW (ref 6.5–8.1)

## 2021-05-28 LAB — BLOOD GAS, ARTERIAL
Acid-Base Excess: 15 mmol/L — ABNORMAL HIGH (ref 0.0–2.0)
Bicarbonate: 43.9 mmol/L — ABNORMAL HIGH (ref 20.0–28.0)
Drawn by: 31996
FIO2: 44
O2 Saturation: 93.3 %
Patient temperature: 37
pCO2 arterial: >120 mmHg (ref 32.0–48.0)
pH, Arterial: 7.157 — CL (ref 7.350–7.450)
pO2, Arterial: 80.5 mmHg — ABNORMAL LOW (ref 83.0–108.0)

## 2021-05-28 LAB — CBC WITH DIFFERENTIAL/PLATELET
Abs Immature Granulocytes: 0.12 10*3/uL — ABNORMAL HIGH (ref 0.00–0.07)
Basophils Absolute: 0 10*3/uL (ref 0.0–0.1)
Basophils Relative: 0 %
Eosinophils Absolute: 0 10*3/uL (ref 0.0–0.5)
Eosinophils Relative: 0 %
HCT: 48.4 % (ref 39.0–52.0)
Hemoglobin: 15.7 g/dL (ref 13.0–17.0)
Immature Granulocytes: 2 %
Lymphocytes Relative: 5 %
Lymphs Abs: 0.3 10*3/uL — ABNORMAL LOW (ref 0.7–4.0)
MCH: 31.8 pg (ref 26.0–34.0)
MCHC: 32.4 g/dL (ref 30.0–36.0)
MCV: 98 fL (ref 80.0–100.0)
Monocytes Absolute: 0.1 10*3/uL (ref 0.1–1.0)
Monocytes Relative: 2 %
Neutro Abs: 5.3 10*3/uL (ref 1.7–7.7)
Neutrophils Relative %: 91 %
Platelets: 167 10*3/uL (ref 150–400)
RBC: 4.94 MIL/uL (ref 4.22–5.81)
RDW: 14.2 % (ref 11.5–15.5)
WBC: 5.8 10*3/uL (ref 4.0–10.5)
nRBC: 0 % (ref 0.0–0.2)

## 2021-05-28 LAB — ECHOCARDIOGRAM COMPLETE
AR max vel: 0.59 cm2
AV Area VTI: 0.61 cm2
AV Area mean vel: 0.58 cm2
AV Mean grad: 56.5 mmHg
AV Peak grad: 93.5 mmHg
Ao pk vel: 4.84 m/s
Area-P 1/2: 2.42 cm2
Height: 66 in
MV M vel: 4 m/s
MV Peak grad: 64 mmHg
MV VTI: 2.14 cm2
Radius: 0.5 cm
S' Lateral: 2.8 cm
Weight: 3626.13 oz

## 2021-05-28 LAB — MAGNESIUM: Magnesium: 2 mg/dL (ref 1.7–2.4)

## 2021-05-28 LAB — BLOOD GAS, VENOUS
Acid-Base Excess: 18.5 mmol/L — ABNORMAL HIGH (ref 0.0–2.0)
Bicarbonate: 47.2 mmol/L — ABNORMAL HIGH (ref 20.0–28.0)
Drawn by: 1679
O2 Saturation: 47.5 %
Patient temperature: 37.1
pCO2, Ven: >120 mmHg (ref 44.0–60.0)
pH, Ven: 7.185 — CL (ref 7.250–7.430)
pO2, Ven: <31 mmHg — CL (ref 32.0–45.0)

## 2021-05-28 LAB — GLUCOSE, CAPILLARY
Glucose-Capillary: 116 mg/dL — ABNORMAL HIGH (ref 70–99)
Glucose-Capillary: 193 mg/dL — ABNORMAL HIGH (ref 70–99)

## 2021-05-28 LAB — HEMOGLOBIN A1C
Hgb A1c MFr Bld: 5.8 % — ABNORMAL HIGH (ref 4.8–5.6)
Mean Plasma Glucose: 119.76 mg/dL

## 2021-05-28 MED ORDER — BUDESONIDE 0.25 MG/2ML IN SUSP
0.2500 mg | Freq: Two times a day (BID) | RESPIRATORY_TRACT | Status: DC
  Administered 2021-05-28 – 2021-05-31 (×5): 0.25 mg via RESPIRATORY_TRACT
  Filled 2021-05-28 (×6): qty 2

## 2021-05-28 MED ORDER — FUROSEMIDE 10 MG/ML IJ SOLN
40.0000 mg | Freq: Two times a day (BID) | INTRAMUSCULAR | Status: AC
  Administered 2021-05-28 – 2021-06-01 (×9): 40 mg via INTRAVENOUS
  Filled 2021-05-28 (×9): qty 4

## 2021-05-28 MED ORDER — INSULIN ASPART 100 UNIT/ML IJ SOLN
0.0000 [IU] | Freq: Every day | INTRAMUSCULAR | Status: DC
  Administered 2021-06-07: 3 [IU] via SUBCUTANEOUS

## 2021-05-28 MED ORDER — INSULIN ASPART 100 UNIT/ML IJ SOLN
0.0000 [IU] | Freq: Three times a day (TID) | INTRAMUSCULAR | Status: DC
  Administered 2021-05-29: 13:00:00 1 [IU] via SUBCUTANEOUS
  Administered 2021-05-30 – 2021-05-31 (×3): 2 [IU] via SUBCUTANEOUS
  Administered 2021-06-01 – 2021-06-08 (×6): 1 [IU] via SUBCUTANEOUS
  Administered 2021-06-09 (×2): 7 [IU] via SUBCUTANEOUS
  Administered 2021-06-10: 2 [IU] via SUBCUTANEOUS

## 2021-05-28 MED ORDER — IPRATROPIUM-ALBUTEROL 0.5-2.5 (3) MG/3ML IN SOLN: 3.0000 mL | Freq: Four times a day (QID) | RESPIRATORY_TRACT | Status: DC | PRN

## 2021-05-28 MED ORDER — IPRATROPIUM-ALBUTEROL 0.5-2.5 (3) MG/3ML IN SOLN
3.0000 mL | Freq: Four times a day (QID) | RESPIRATORY_TRACT | Status: DC
  Administered 2021-05-28 – 2021-06-03 (×21): 3 mL via RESPIRATORY_TRACT
  Filled 2021-05-28 (×24): qty 3

## 2021-05-28 MED ORDER — PREDNISONE 50 MG PO TABS
50.0000 mg | ORAL_TABLET | Freq: Every day | ORAL | Status: AC
  Administered 2021-05-28 – 2021-05-31 (×4): 50 mg via ORAL
  Filled 2021-05-28 (×4): qty 1

## 2021-05-28 NOTE — Progress Notes (Signed)
Patient placed on bipap at this time due to ABG results.  PCCM has been consulted and will round shortly. Bipap settings are 18/8 50% 16

## 2021-05-28 NOTE — Progress Notes (Signed)
°  Echocardiogram 2D Echocardiogram has been performed.  Kyle Lewis 05/28/2021, 10:38 AM

## 2021-05-28 NOTE — Progress Notes (Signed)
Nutrition Brief Note  Patient identified on the Malnutrition Screening Tool (MST) Report.  Admitting Dx: Acute CHF (congestive heart failure) (HCC) [I50.9] Acute congestive heart failure, unspecified heart failure type (HCC) [I50.9] PMH:  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   Abscess of left leg    Acute kidney injury superimposed on chronic kidney disease ([place]) 01/10/2018   Adenomatous colon polyp    Adrenal mass (HCC)    2 benign appearing left adrenal adenomas noted on 01/13/15 CT   Allergy    seasonal   Aortic stenosis    mild to moderate by echo and cath in 09/2016   Arthritis    Carotid artery occlusion    Cataract    Chronic back pain    COPD (chronic obstructive pulmonary disease) (HCC)    Coronary artery disease    CVA (cerebral infarction)    Degeneration of cervical intervertebral disc    Diverticulosis    History of back surgery    Rods and Screws in back   History of left knee replacement    Hypertension    Meningioma (HCC)    Nocturia    Peripheral vascular disease (HCC)    Ringing in ear    ([place])   Ruptured abdominal aortic aneurysm (AAA) 07/01/2015   Formatting of this note might be different from the original. Overview:  Last evaluation by ultrasounds with diameter of 5 cm follow-up by Dr. Oneida Alar last seen in June 10, 2015 Formatting of this note might be different from the original. Last evaluation by ultrasounds with diameter of 5 cm follow-up by Dr. Oneida Alar last seen in June 10, 2015   Sleep apnea    wears BIPAP set on 5 to 9   Stroke ([place]) 2013   tia no residual deficit from   Medications:   enoxaparin (LOVENOX) injection  40 mg Subcutaneous QHS   feeding supplement  237 mL Oral BID BM   finasteride  5 mg Oral Daily   furosemide  40 mg Intravenous Q12H   gabapentin  300 mg Oral BID   mirabegron ER  25 mg Oral Daily   nitroGLYCERIN  1 inch Topical Q6H   tamsulosin   0.4 mg Oral QPC supper   Labs: Recent Labs  Lab 05/27/21 1420 05/28/21 0026  NA 144 139  K 4.1 4.1  CL 104 97*  CO2 34* 36*  BUN 23 18  CREATININE 0.74 0.81   0.82  CALCIUM 8.4* 7.9*  MG  --  2.0  GLUCOSE 123* 210*    Wt Readings from Last 15 Encounters:  05/28/21 102.8 kg  05/05/21 101 kg  01/11/21 101.6 kg  09/07/20 101.9 kg  08/25/20 101 kg  08/24/20 103 kg  08/13/20 100.8 kg  08/13/20 100.8 kg  08/06/20 100.2 kg  07/19/20 102.1 kg  06/03/20 102.1 kg  04/08/20 103 kg  01/12/20 108 kg  09/25/19 110.2 kg  04/07/19 101.2 kg  Body mass index is 36.58 kg/m. Patient meets criteria for obesity based on current BMI.   Current diet order is heart healthy. No PO intake documented, but RN reports pt ate ~ 75% or more of breakfast and pt denies any changes to appetite.  No nutrition interventions warranted at this time. If nutrition issues arise, please consult RD.    Theone Stanley., MS, RD, LDN (she/her/hers) RD pager number and weekend/on-call pager number located in [place].

## 2021-05-28 NOTE — Progress Notes (Signed)
Unable to obtain abg 2x due to pt. Complaining of pain and discomfort.

## 2021-05-28 NOTE — Consult Note (Addendum)
Cardiology Consultation:   Patient ID: Kyle Lewis MRN: [id number]; DOB: [birth year]  Admit date: 05/27/2021 Date of Consult: 05/28/2021  PCP:  [name], MD   [place] [facility] Cardiologist:  Jenean Lindau, MD        Patient Profile:   Kyle Lewis is a 75 y.o. male with a hx of CAD rx medically, AAA s/p repair, CKD III, AS, carotid dz, OSA on CPAP, CVA, COPD, HTN who is being seen 05/28/2021 for the evaluation of AS at the request of Dr Doristine Bosworth.  History of Present Illness:   Kyle Lewis was hospitalized in [place], [place] in 2019 for heat stroke and NSTEMI. He was intubated for several days. Cath and echo results are below. They felt he had severe AS w/ mean gradient 47 and recommended TAVR eval, but he preferred to go back to [place].  He was last seen by Dr Geraldo Pitter 11/2018 and was doing well by tele-visit. Dr Geraldo Pitter felt his AS was moderate.   He was admitted 12/23 from [facility] ER for SOB, hypoxia w/ O2 sats 58% on RA, elevated BNP, mildly elevated troponin. Pt felt to be at high risk for AS to now be severe, Cards asked to see.      Kyle Lewis states that he has had a very low activity level for a long time.  He feels that he can walk about 25 feet.  He admits that he gets short of breath walking from the bedroom to the kitchen.  He has had some lower extremity edema but says it has not been severe.  He has been waking up in the night to urinate, but denies PND.  He has orthopnea.  He thinks his abdomen may be a little bit bigger than it was, but is not sure.  He wears oxygen at night with his CPAP, and has a concentrator, but did not try oxygen at home to help his symptoms.  Since being in the hospital on O2, he feels better.  He is very sleepy now, since he has not been getting a lot of rest.  Says he has not had any chest pain.   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   Abscess of  left leg    Acute kidney injury superimposed on chronic kidney disease ([place]) 01/10/2018   Adenomatous colon polyp    Adrenal mass (HCC)    2 benign appearing left adrenal adenomas noted on 01/13/15 CT   Allergy    seasonal   Aortic stenosis    mild to moderate by echo and cath in 09/2016   Arthritis    Carotid artery occlusion    Cataract    Chronic back pain    COPD (chronic obstructive pulmonary disease) (HCC)    Coronary artery disease    CVA (cerebral infarction)    Degeneration of cervical intervertebral disc    Diverticulosis    History of back surgery    Rods and Screws in back   History of left knee replacement    Hypertension    Meningioma (HCC)    Nocturia    Peripheral vascular disease ([place])    Ringing in ear    (SLIGHT)   Ruptured abdominal aortic aneurysm (AAA) 07/01/2015   Formatting of this note might be different from the original. Overview:  Last evaluation by ultrasounds with diameter of 5 cm follow-up by Dr.  fields last seen in June 10, 2015 Formatting of this note might be different from the original. Last evaluation by ultrasounds with diameter of 5 cm follow-up by Dr. Oneida Alar last seen in June 10, 2015   Sleep apnea    wears BIPAP set on 5 to 9   Stroke [facility]) 2013   tia no residual deficit from    Past Surgical History:  Procedure Laterality Date   ABDOMINAL AORTIC ANEURYSM REPAIR  2019   [place]  as child   BACK SURGERY  2018   Rods and Screws in Back lower back   BRAIN MENINGIOMA EXCISION  1991   menigioma   CARPAL TUNNEL RELEASE     left   EYE SURGERY Bilateral    Cataract   IRRIGATION AND DEBRIDEMENT ABSCESS Left 08/25/2020   Procedure: IRRIGATION AND DEBRIDEMENT ABSCESS LEFT LEG;  Surgeon: Evelina Bucy, DPM;  Location: [place];  Service: Podiatry;  Laterality: Left;   JOINT REPLACEMENT Left 04-01-12   Knee   LEFT HEART CATH AND CORONARY ANGIOGRAPHY N/A 09/15/2016   Procedure: Left Heart Cath and  Coronary Angiography;  Surgeon: Nelva Bush, MD;  Location: [facility];  Service: Cardiovascular;  Laterality: N/A;   MAXIMUM ACCESS (MAS)POSTERIOR LUMBAR INTERBODY FUSION (PLIF) 1 LEVEL N/A 07/07/2015   Procedure:  POSTERIOR LUMBAR INTERBODY FUSION (PLIF) Lumbar Four-Five with Pedicle Screw Fixation Lumbar Two-Five;Laminectomy Lumbar Two-Five;  Surgeon: Eustace Moore, MD;  Location: [facility];  Service: Neurosurgery;  Laterality: N/A;   POSTERIOR LUMBAR INTERBODY FUSION (PLIF) Lumbar Four-Five with Pedicle Screw Fixation Lumbar Two-Five;Laminectomy Lumbar Two-Five   RIGHT/LEFT HEART CATH AND CORONARY ANGIOGRAPHY N/A 01/24/2018   Procedure: RIGHT/LEFT HEART CATH AND CORONARY ANGIOGRAPHY;  Surgeon: Nelva Bush, MD;  Location: [place];  Service: Cardiovascular;  Laterality: N/A;   TONSILLECTOMY  as child   TOTAL KNEE ARTHROPLASTY  04/01/2012   Procedure: TOTAL KNEE ARTHROPLASTY;  Surgeon: Gearlean Alf, MD;  Location: [facility];  Service: Orthopedics;  Laterality: Left;   TRANSURETHRAL RESECTION OF PROSTATE     UMBILICAL HERNIA REPAIR  2011     Home Medications:  Prior to Admission medications   Medication Sig Start Date End Date Taking? Authorizing Provider  acetaminophen (TYLENOL) 500 MG tablet Take 500 mg by mouth at bedtime.     [provider]  albuterol (VENTOLIN HFA) 108 (90 Base) MCG/ACT inhaler Inhale 2 puffs into the lungs every 6 (six) hours as needed for wheezing or shortness of breath. 05/05/21   Fargo, Amy E, NP  AZO-CRANBERRY PO Take 2 tablets by mouth at bedtime.    [provider]  b complex vitamins capsule Take 1 capsule by mouth daily.    [provider]  Cholecalciferol (D-3-5) 125 MCG (5000 UT) capsule Take 5,000 Units by mouth daily.    [provider]  clopidogrel (PLAVIX) 75 MG tablet Take 1 tablet (75 mg total) by mouth daily. 12/20/20   [name], MD  finasteride (PROSCAR) 5 MG tablet Take 1 tablet (5  mg total) by mouth daily. 05/05/21   Fargo, Amy E, NP  gabapentin (NEURONTIN) 300 MG capsule Take 1 capsule (300 mg total) by mouth 2 (two) times daily. 05/05/21   Fargo, Amy E, NP  guaiFENesin 200 MG tablet Take 2 tablets (400 mg total) by mouth 2 (two) times daily. 05/05/21   Fargo, Amy E, NP  loperamide (IMODIUM) 2 MG capsule Take 2 mg by  mouth as needed.     [provider]  methocarbamol (ROBAXIN) 500 MG tablet Take 1 tablet (500 mg total) by mouth at bedtime as needed for muscle spasms. 05/05/21   Fargo, Amy E, NP  mirabegron ER (MYRBETRIQ) 25 MG TB24 tablet Take 25 mg by mouth daily.    [provider]  olmesartan (BENICAR) 40 MG tablet Take 1 tablet (40 mg total) by mouth daily. 01/27/21   [name], MD  saccharomyces boulardii (FLORASTOR) 250 MG capsule Take 1 capsule (250 mg total) by mouth 2 (two) times daily for 9 days. 05/20/21 05/29/21  Medina-Vargas, Monina C, NP  tamsulosin (FLOMAX) 0.4 MG CAPS capsule Take 1 capsule (0.4 mg total) by mouth daily after supper. 12/20/20   [name], MD    Inpatient Medications: Scheduled Meds:  enoxaparin (LOVENOX) injection  40 mg Subcutaneous QHS   feeding supplement  237 mL Oral BID BM   finasteride  5 mg Oral Daily   furosemide  40 mg Intravenous Q12H   gabapentin  300 mg Oral BID   mirabegron ER  25 mg Oral Daily   nitroGLYCERIN  1 inch Topical Q6H   tamsulosin  0.4 mg Oral QPC supper   Continuous Infusions:  PRN Meds: acetaminophen **OR** acetaminophen, albuterol, ondansetron **OR** ondansetron (ZOFRAN) IV  Allergies:    Allergies  Allergen Reactions   Cefazolin Rash and Other (See Comments)    The patient had surgery and was given cefazolin intraop. ~ 10 days later he developed a rash confirmed by biopsy to be consistent w/ drug eruption. We cannot know for sure, but this is the most likely agent.      Social History:   Social History   Socioeconomic History   Marital status: Married    Spouse  name: [name]    Number of children: 2   Years of education: College    Highest education level: Not on file  Occupational History    Comment: Self employed Administrator retired  Tobacco Use   Smoking status: Former    Packs/day: 0.75    Years: 40.00    Pack years: 30.00    Types: Cigarettes    Quit date: 07/20/2019    Years since quitting: 1.8   Smokeless tobacco: Never   Tobacco comments:    1/2 pack a day if that   Vaping Use   Vaping Use: Never used  Substance and Sexual Activity   Alcohol use: Yes    Comment: rare   Drug use: No   Sexual activity: Not Currently  Other Topics Concern   Not on file  Social History Narrative   Patient lives at home with his wife. Rhonda    Patient has a Financial risk analyst.    Patient has 2 sons.    Patient smokes a half pack a day.          Social Determinants of Health   Financial Resource Strain: Not on file  Food Insecurity: Not on file  Transportation Needs: Not on file  Physical Activity: Not on file  Stress: Not on file  Social Connections: Not on file  Intimate Partner Violence: Not on file    Family History:   Family History  Problem Relation Age of Onset   Heart disease Mother        Onset ~68 y/o   Hypertension Mother        Deceased from old age at 88   Hyperlipidemia Mother  Arthritis Mother    Diabetes Father        Deceased from old age at 79   Heart attack Father    Heart disease Father        CABG at age 60   Dementia Father 44   Arthritis Father    Prostate cancer Maternal Grandfather    Colon cancer Neg Hx    Esophageal cancer Neg Hx    Rectal cancer Neg Hx    Stomach cancer Neg Hx      ROS:  Please see the history of present illness.  All other ROS reviewed and negative.     Physical Exam/Data:   Vitals:   05/27/21 2312 05/28/21 0344 05/28/21 0537 05/28/21 0749  BP: (!) 171/97 113/73 111/68 139/81  Pulse: 74 (!) 59 63 66  Resp: 20 18    Temp: 98 F (36.7 C) 98.6 F (37 C)  97.7 F  (36.5 C)  TempSrc: Oral   Oral  SpO2: 94% 95% 97%   Weight: 101.2 kg 102.8 kg    Height:        Intake/Output Summary (Last 24 hours) at 05/28/2021 0953 Last data filed at 05/28/2021 0902 Gross per 24 hour  Intake 240 ml  Output 550 ml  Net -310 ml   Last 3 Weights 05/28/2021 05/27/2021 05/27/2021  Weight (lbs) 226 lb 10.1 oz 223 lb 1.7 oz 220 lb  Weight (kg) 102.8 kg 101.2 kg 99.791 kg     Body mass index is 36.58 kg/m.  General:  Well nourished, well developed, in mild respiratory distress HEENT: normal Neck: JVD 10 cm Vascular: No carotid bruits; Distal radial pulses 2+ bilaterally Cardiac:  normal S1, S2; RRR; 2/6 murmur  Lungs: Decreased breath sounds bases with some rales bilaterally, no wheezing, rhonchi  Abd: Obese, soft, nontender, no hepatomegaly  Ext: no edema Musculoskeletal:  No deformities, BUE and BLE strength weak but equal Skin: warm and dry  Neuro:  CNs 2-12 intact, no focal abnormalities noted Psych:  Normal affect   EKG:  The EKG was personally reviewed and demonstrates: 12/23 ECG is sinus rhythm, heart rate 88, nonspecific IVCD with QRS 132 ms Telemetry:  Telemetry was personally reviewed and demonstrates: Sinus rhythm  Relevant CV Studies:  ECHO: 01/10/2018 [facility] in [place], [place] MMode/2D Measurements & Calculations  Ao root diam: 4.0 cm             LVOT diam: 2.0 cm      TAPSE_phl: 2.8 cm    Lat Peak E' Vel: 7.2 cm/sec  Ao root area: 12.8 cm2           LVOT area: 3.0 cm2  LA dimension: 3.4 cm                                                          __  Med Peak E' Vel: 8.8 cm/sec      E\Med E': 11.4   Doppler Measurements & Calculations  MV E max vel: 90.2 cm/sec   MV V2 max: 117.3 cm/sec  MV P1/2t max vel: 89.9 cm/sec  Ao V2 max: 413.4 cm/sec  MV A max vel: 115.4 cm/sec  MV max PG: 5.5 mmHg      MV P1/2t: 94.9 msec  Ao max PG: 68.4 mmHg  MV E/A: 0.78                MV V2 mean: 83.0 cm/sec                                  Ao V2 mean: 314.1 cm/sec                              MV mean PG: 3.0 mmHg     MVA(P1/2t): 2.3 cm2            Ao mean PG: 43.4 mmHg                              MV V2 VTI: 25.6 cm       MV dec slope: 277.6 cm/sec2    Ao V2 VTI: 91.1 cm                                                       MV dec time: 0.27 sec                              MVA(VTI): 2.4 cm2                                       AVA(I,D): 0.66 cm2                                                                                      AVA(V,D): 0.82 cm2                                                         __   LV V1 max PG: 5.1 mmHg      MR max vel: 497.2 cm/sec SV(LVOT): 60.5 ml              PA V2 max: 148.6 cm/sec  LV V1 mean PG: 2.3 mmHg     MR max PG: 99.0 mmHg     SI(LVOT): 27.8 ml/m2           PA max PG: 8.9 mmHg  LV V1 max: 113.0 cm/sec  LV V1 mean: 67.5 cm/sec  LV V1 VTI: 20.2 cm  __   TR max vel: 203.6 cm/sec    AV VR_phl: 0.27          MV P1/2t-pr_phl: 96.3 msec     RV S Vel_phl: 13.9 cm/sec  TR max PG: 16.6 mmHg        AVA(VTI)/BSA_phl: 0.32   QLAB Heart Model  EDV (HM)_phl: 276.0 ml                     EF (HM)_phl: 50.0 %          ED Current (HM)_phl: 60.0 %  ESV (HM)_phl: 140.0 ml                     HR (HM)_phl: 81.0 BPM        ES Current (HM)_phl: 30.0 %  LV Length ED (HM)_phl: 101.0 mm            SV (HM)_phl: 137.0 ml        ED Default (HM)_phl: 60.0 %  LV Length ES (HM)_phl: 88.0 mm                                          ES Default (HM)_phl: 30.0 %   Aortic Valve Calcified aortic valve. Severe valvular aortic stenosis. No aortic regurgitation is present.  Mitral Valve There is mild degenerative thickening of the mitral valve. There is mild mitral regurgitation.  Tricuspid Valve There is mild tricuspid regurgitation.  Pulmonic Valve The pulmonic valve is not well visualized.  Atria The left atrial size is normal.  Right Ventricle The right  ventricle is normal in size and function.  Left Ventricle The left ventricle is grossly normal size. Left ventricular systolic function is borderline  reduced. Ejection Fraction = 50-55%. The transmitral spectral Doppler flow pattern is suggestive of impaired  LV relaxation. The left ventricular wall motion is normal.  Great Vessels/ IVC Mild aortic root dilatation.  Pericardium There is no pericardial effusion. There is no pleural effusion.    Interpretation Summary  Ejection Fraction = 50-55%.  Severe calcific valvular aortic stenosis. Mean AV gradient 47 mmHg, Peak velocity 4.2 m/s and AVA ~ 0.7 cm2.  Mild MR    CARDIAC CATH: 01/24/2018 in [place] Conclusions: Stable appearance of coronary arteries since 09/2016.  Moderate to severe disease involving the distal RCA/rPDA and OM2 is not significantly changed and involves relatively small vessels.  Recent troponin elevation in [place] was likely due to supply-demand mismatch in the setting of heat stroke/hyperthermia, moderate aortic stenosis, and moderate to severe CAD involving small branches. Upper normal to mildly elevated left and right heart filling pressures. Normal Fick cardiac output/index. Moderate aortic stenosis.   Recommendations: Continue medical therapy.  Given minimal symptoms and stable disease since prior catheterization in 09/2016, I do not recommend PCI to small to moderate sized OM2 and rPDA branches at this time. Obtain outpatient echo to reassess aortic valve, given discrepant findings between recent outside echo (severe AS) and today's catheterization. Patient wishes to follow-up with Dr. Geraldo Pitter in [place]; we will arrange for appointment in ~2 weeks.  If his symptoms remain stable or improve and echo confirms non-critical aortic stenosis, I think it is reasonable for Kyle Lewis to begin driving again.   Recommend continuation of clopidogrel 75 mg daily for moderate CAD and history of AAA status post endovascular  repair. Diagnostic Dominance: Right  Laboratory Data:  High Sensitivity Troponin:   Recent Labs  Lab 05/27/21 1420 05/27/21 1904  TROPONINIHS 58* 54*     Chemistry Recent Labs  Lab 05/27/21 1420 05/28/21 0026  NA 144 139  K 4.1 4.1  CL 104 97*  CO2 34* 36*  GLUCOSE 123* 210*  BUN 23 18  CREATININE 0.74 0.81   0.82  CALCIUM 8.4* 7.9*  MG  --  2.0  GFRNONAA >60 >60   >60  ANIONGAP 6 6    Recent Labs  Lab 05/27/21 1420 05/28/21 0026  PROT 6.2* 5.5*  ALBUMIN 3.7 3.3*  AST 19 22  ALT 31 29  ALKPHOS 56 48  BILITOT 0.8 1.1   Lipids No results for input(s): CHOL, TRIG, HDL, LABVLDL, LDLCALC, CHOLHDL in the last 168 hours.  Hematology Recent Labs  Lab 05/27/21 1420 05/28/21 0026  WBC 7.7 5.8  RBC 5.20 4.94  HGB 16.6 15.7  HCT 52.8* 48.4  MCV 101.5* 98.0  MCH 31.9 31.8  MCHC 31.4 32.4  RDW 14.5 14.2  PLT 186 167   Thyroid No results for input(s): TSH, FREET4 in the last 168 hours.  BNP Recent Labs  Lab 05/27/21 1420  BNP 1,348.7*    DDimer No results for input(s): DDIMER in the last 168 hours.   Radiology/Studies:  DG Chest 2 View  Result Date: 05/27/2021 CLINICAL DATA:  Shortness of breath, cough EXAM: CHEST - 2 VIEW COMPARISON:  04/07/2019 FINDINGS: Eventration of the right hemidiaphragm. Small right and trace left pleural effusions. Associated bibasilar atelectasis. No frank interstitial edema.  Mild cardiomegaly.  No pneumothorax. IMPRESSION: Mild cardiomegaly with small right and trace left pleural effusions. Associated bibasilar atelectasis. No frank interstitial edema. Electronically Signed   By: Julian Hy M.D.   On: 05/27/2021 14:30     Assessment and Plan:   Severe aortic stenosis -Echo today pending formal read, preliminary results show a mean gradient of 59 mmHg and a max gradient of 94 with a V-max 485 cm/s, definitely severe aortic stenosis - Continue IV Lasix.  We will be cautious to avoid overdiuresis in setting of  severe AS, but would continue IV Lasix for now  -We will plan for RHC/LHC later this week  2. Acute on chronic diastolic CHF, COPD - He was 103 kg 04/2020, has generally been between 101 & 103 kg since then - he is 102.8 kg today - He is only on nocturnal O2, and wears CPAP at home - He is on steroids, nebulizers, and oxygen at 4 L >> sats have improved - he is on Lasix 40 mg IV every 12 hours.  Have to be careful not to over diurese with his aortic stenosis  Otherwise, per IM   Risk Assessment/Risk Scores:       New York Heart Association (NYHA) Functional Class NYHA Class IV     For questions or updates, please contact [place] Please consult [email or web address] for contact info under    Signed, Rosaria Ferries, PA-C  05/28/2021 9:53 AM  Patient seen and examined.  Agree with above documentation.  Kyle Lewis is a 75 year old male with a history of CAD, aortic stenosis, AAA status postrepair, CKD stage III, OSA, CVA, COPD, hypertension who we are consulted for evaluation of heart failure.  LHC 01/24/2018 showed moderate to severe disease involving distal RCA/RPDA and OM 2, moderate aortic stenosis.  Medical management was recommended.  Echocardiogram on 01/30/2018 showed EF 65 to 70%, moderate AS (Vmax 3.7 m/s, mean gradient  32 mmHg, AVA 1.2 cm^2).  He presented with worsening shortness of breath.  Reports had been on prednisone for 7 to 10 days and antibiotics for presumed COPD exacerbation.  He was found to be hypoxic when he checked his O2 sats at home, prompting him to present to the ED.  On presentation to the ED SPO2 was 94% on 4 L, BP 175/95,  pulse 100.  Labs notable for creatinine 0.7, BNP 1350, troponin 58 > 54, WBC 7.7, hemoglobin 16.6, platelets 186, COVID-19 negative, flu negative.  Chest x-ray with mild cardiomegaly with small right pleural effusion.  EKG was normal sinus rhythm, rate 88, LVH with reports vision changes.  Telemetry shows normal sinus rhythm with rate 60s to  70s.  Echocardiogram personally reviewed and shows normal LV systolic function but severe aortic stenosis (V-max 4.9 m/s, mean gradient 59 mmHg, AVA 0.6 cm, DI 0.18).  On exam, patient is alert and oriented, regular rate and rhythm, 3 out of 6 systolic murmur, bibasilar crackles, no edema.  Suspect his acute on chronic diastolic heart failure is being driven by his very severe aortic stenosis.  Recommend continuing IV Lasix.  Will plan RHC/LHC later this week and evaluation by structural heart team.  Donato Heinz, MD

## 2021-05-28 NOTE — Consult Note (Signed)
NAME:  Kyle Lewis, MRN:  [id number], DOB:  1945/12/13, LOS: 1 ADMISSION DATE:  05/27/2021, CONSULTATION DATE:  05/28/21 REFERRING MD:  Doristine Bosworth, CHIEF COMPLAINT:  Hypercapnic respiratory failure   History of Present Illness:  38M with PMHx siginficant for COPD with moderately sevevere obstructive defect, OSA on nocturnal bipap, coronary artery disease and severe aortic stenosis whom we are asked to see in consultation re: hypoxic/hypercapnic respiratory failure.  He was admitted on 12/23 to the hospitalist service with cc of 10 days of SOB, treated at the start of his course with prednisone and an antibiotic as an outpatient. He did not improve and was noted to be hypoxic at home. On arrival HDS and requiring 4L [place] (no home O2 requirement). Labs were notable for a BNP elevated to 1348 with new history of ankle swelling. He was admitted with plan for diuresis for presumed CHF exacerbation in setting of aortic stenosis and possible TAVR workup.Also treated for potential COPD exacerbation as had wheeze on arrival. Cardiology service saw today and recommended continued attempts at cautious diuresis with plans for RHC/LHC later this week for TAVR workup. Notably, his weight was noted to be stable since 11/201 at 103kg  The evening of 05/28/21 was noted to become obtunded. ABG demonstrated uncompensated acute on chronic respiratory acidosis with 7.15/>120/80/44. NIPPV was initiated (had not been on prior during this hospital stay per RT). On my exam this evening he is awake and alert on BIPAP, Spo2 100%)  Pertinent  Medical History  COPD (PFTs 2020 FEV1 57% oof predicted, DLCO 66% of predicted) OSA requiring nocturnal Bipap (IPAP 23, EPAP 19) Obesity Severe aortic stenosis Coronary artery disease AAA repair Tobacco use, ongoing HFpEF  Significant Hospital Events: Including procedures, antibiotic start and stop dates in addition to other pertinent events   05/27/21 - Admitted to hospitalist  service for CHF exacerbation, TAVR workup for new severe AS 05/28/21 - in PM, patient altered. ABG 7.15/>120/80.5/44. Placed on NIPPV  Interim History / Subjective:  RT placed on NIPPV. Mental status improved. Patient reports nightly compliance with nocturnal bipap at home.  Objective   Blood pressure 136/77, pulse 77, temperature (!) 97.1 F (36.2 C), temperature source Axillary, resp. rate (!) 22, height 5\' 6"  (1.676 m), weight 102.8 kg, SpO2 95 %.    FiO2 (%):  [32 %] 32 %   Intake/Output Summary (Last 24 hours) at 05/28/2021 1929 Last data filed at 05/28/2021 1418 Gross per 24 hour  Intake 420 ml  Output 550 ml  Net -130 ml   Filed Weights   05/27/21 1406 05/27/21 2312 05/28/21 0344  Weight: 99.8 kg 101.2 kg 102.8 kg    Examination: General: obese male with ruddy complection resting in bed on NIPPV. NO distress HENT: anicteric sclera. Pupils equal in size. Moist oral mucosa Lungs: diminished air movement bilaterally. No crackles. No wheeze. Symmetric chest rise Cardiovascular: RRR. Harsh systolic murmur RICS2. Cap refill < 2 seconds. Trace pedal edema Abdomen: nontender, nondistended Extremities: no cyanosis. No clubbing Neuro: Awake. Follows commands in all four extremities. Oriented to self and situation  Assessment & Plan:   #Acute Hypoxic and Hypercapnic Respiratory Failure -- DDX: likely multifactorial. Acute on chronic diastolic heart failure likely a big contributor given BNP elevated to 1348 with new small to moderate right sided effusion in setting of severe AS and moderate MR. Although not significantly volume overloaded peripherally. Air movement is somewhat diminished in setting of his known obstructive airways disease. Also is chronically bipap dependent  with sleep and had not used yet this hospital stay and could be contributing to this acute episode    --On admission required  4L nasal cannula. Became altered evening of 05/28/21 with significant uncompensated  respiratory acidosis noted on ABG --Diagnostic plan: Repeat ABG s/p trial of NIPPV. Anticipate will be improved --Therapeutic plan: Change duoneb from PRN to scheduled. Added BID budesonide as well. Agree to continue prednisone for a total of 5 days Agree with plan for diuresis for net negative volume status. Strict I/O. Careful monitoring given severe aortic stenosis with plans for further evaluation by cards with RHC/LHC this week NIPPV with sleep and naps. Can trial off in AM after additional diuresis and bronchodilators this evening. SpO2 goal >88%. Avoid hyperoxia   Labs   CBC: Recent Labs  Lab 05/27/21 1420 05/28/21 0026  WBC 7.7 5.8  NEUTROABS 6.1 5.3  HGB 16.6 15.7  HCT 52.8* 48.4  MCV 101.5* 98.0  PLT 186 893    Basic Metabolic Panel: Recent Labs  Lab 05/27/21 1420 05/28/21 0026  NA 144 139  K 4.1 4.1  CL 104 97*  CO2 34* 36*  GLUCOSE 123* 210*  BUN 23 18  CREATININE 0.74 0.81   0.82  CALCIUM 8.4* 7.9*  MG  --  2.0   GFR: Estimated Creatinine Clearance: 88.5 mL/min (by C-G formula based on SCr of 0.81 mg/dL). Recent Labs  Lab 05/27/21 1420 05/28/21 0026  WBC 7.7 5.8    Liver Function Tests: Recent Labs  Lab 05/27/21 1420 05/28/21 0026  AST 19 22  ALT 31 29  ALKPHOS 56 48  BILITOT 0.8 1.1  PROT 6.2* 5.5*  ALBUMIN 3.7 3.3*   No results for input(s): LIPASE, AMYLASE in the last 168 hours. No results for input(s): AMMONIA in the last 168 hours.  ABG    Component Value Date/Time   PHART 7.157 (LL) 05/28/2021 1747   PCO2ART >120.00 (HH) 05/28/2021 1747   PO2ART 80.5 (L) 05/28/2021 1747   HCO3 43.9 (H) 05/28/2021 1747   TCO2 31 08/25/2020 1036   O2SAT 93.3 05/28/2021 1747     Coagulation Profile: No results for input(s): INR, PROTIME in the last 168 hours.  Cardiac Enzymes: No results for input(s): CKTOTAL, CKMB, CKMBINDEX, TROPONINI in the last 168 hours.  HbA1C: Hgb A1c MFr Bld  Date/Time Value Ref Range Status  05/28/2021 12:26 AM  5.8 (H) 4.8 - 5.6 % Final    Comment:    (NOTE) Pre diabetes:          5.7%-6.4%  Diabetes:              >6.4%  Glycemic control for   <7.0% adults with diabetes   09/25/2019 11:10 AM 5.5 4.8 - 5.6 % Final    Comment:             Prediabetes: 5.7 - 6.4          Diabetes: >6.4          Glycemic control for adults with diabetes: <7.0     CBG: Recent Labs  Lab 05/28/21 1806  GLUCAP 116*    Review of Systems:   Limited 2/2 patient condition  Past Medical History:  He,  has a past medical history of AAA (abdominal aortic aneurysm), Abscess of left leg, Acute kidney injury superimposed on chronic kidney disease ([place]) (01/10/2018), Adenomatous colon polyp, Adrenal mass ([place]), Allergy, Aortic stenosis, Arthritis, Carotid artery occlusion, Cataract, Chronic back pain, COPD (chronic obstructive pulmonary disease) ([place]),  Coronary artery disease, CVA (cerebral infarction), Degeneration of cervical intervertebral disc, Diverticulosis, History of back surgery, History of left knee replacement, Hypertension, Meningioma ([place]), Nocturia, Peripheral vascular disease ([place]), Ringing in ear, Ruptured abdominal aortic aneurysm (AAA) (07/01/2015), Sleep apnea, and Stroke ([place]) (2013).   Surgical History:   Past Surgical History:  Procedure Laterality Date   ABDOMINAL AORTIC ANEURYSM REPAIR  2019   [place]  as child   BACK SURGERY  2018   [facility] Scre[facility] in Back lower back   BRAIN MENINGIOMA EXCISION  1991   menigioma   CARPAL TUNNEL RELEASE     left   EYE SURGERY Bilateral    Cataract   IRRIGATION AND DEBRIDEMENT ABSCESS Left 08/25/2020   Procedure: IRRIGATION AND DEBRIDEMENT ABSCESS LEFT LEG;  Surgeon: Evelina Bucy, DPM;  Location: [place];  Service: Podiatry;  Laterality: Left;   JOINT REPLACEMENT Left 04-01-12   Knee   LEFT HEART CATH AND CORONARY ANGIOGRAPHY N/A 09/15/2016   Procedure: Left Heart Cath and Coronary Angiography;  Surgeon: Nelva Bush, MD;  Location: [place] [facility];  Service: Cardiovascular;  Laterality: N/A;   MAXIMUM ACCESS (MAS)POSTERIOR LUMBAR INTERBODY FUSION (PLIF) 1 LEVEL N/A 07/07/2015   Procedure:  POSTERIOR LUMBAR INTERBODY FUSION (PLIF) Lumbar Four-Five with Pedicle Screw Fixation Lumbar Two-Five;Laminectomy Lumbar Two-Five;  Surgeon: Eustace Moore, MD;  Location: [facility];  Service: Neurosurgery;  Laterality: N/A;   POSTERIOR LUMBAR INTERBODY FUSION (PLIF) Lumbar Four-Five with Pedicle Screw Fixation Lumbar Two-Five;Laminectomy Lumbar Two-Five   RIGHT/LEFT HEART CATH AND CORONARY ANGIOGRAPHY N/A 01/24/2018   Procedure: RIGHT/LEFT HEART CATH AND CORONARY ANGIOGRAPHY;  Surgeon: Nelva Bush, MD;  Location: [place];  Service: Cardiovascular;  Laterality: N/A;   TONSILLECTOMY  as child   TOTAL KNEE ARTHROPLASTY  04/01/2012   Procedure: TOTAL KNEE ARTHROPLASTY;  Surgeon: Gearlean Alf, MD;  Location: [facility];  Service: Orthopedics;  Laterality: Left;   TRANSURETHRAL RESECTION OF PROSTATE     UMBILICAL HERNIA REPAIR  2011     Social History:   reports that he quit smoking about 22 months ago. His smoking use included cigarettes. He has a 30.00 pack-year smoking history. He has never used smokeless tobacco. He reports current alcohol use. He reports that he does not use drugs.   Family History:  His family history includes Arthritis in his father and mother; Dementia (age of onset: 50) in his father; Diabetes in his father; Heart attack in his father; Heart disease in his father and mother; Hyperlipidemia in his mother; Hypertension in his mother; Prostate cancer in his maternal grandfather. There is no history of Colon cancer, Esophageal cancer, Rectal cancer, or Stomach cancer.   Allergies Allergies  Allergen Reactions   Cefazolin Rash and Other (See Comments)    The patient had surgery and was given cefazolin intraop. ~ 10 days later he developed a rash confirmed by biopsy to be consistent w/  drug eruption. We cannot know for sure, but this is the most likely agent.       Home Medications  Prior to Admission medications   Medication Sig Start Date End Date Taking? Authorizing Provider  acetaminophen (TYLENOL) 650 MG CR tablet Take 1,300 mg by mouth at bedtime.   Yes [provider]  Cholecalciferol (D-3-5) 125 MCG (5000 UT) capsule Take 5,000 Units by mouth every morning.   Yes [provider]  clopidogrel (PLAVIX) 75 MG tablet Take 1 tablet (75 mg  total) by mouth daily. Patient taking differently: Take 75 mg by mouth every morning. 12/20/20  Yes [name], MD  finasteride (PROSCAR) 5 MG tablet Take 1 tablet (5 mg total) by mouth daily. Patient taking differently: Take 5 mg by mouth at bedtime. 05/05/21  Yes Fargo, Amy E, NP  gabapentin (NEURONTIN) 300 MG capsule Take 1 capsule (300 mg total) by mouth 2 (two) times daily. Patient taking differently: Take 300 mg by mouth at bedtime. 05/05/21  Yes Fargo, Amy E, NP  loperamide (IMODIUM) 2 MG capsule Take 2 mg by mouth daily as needed for diarrhea or loose stools.   Yes [provider]  methocarbamol (ROBAXIN) 500 MG tablet Take 1 tablet (500 mg total) by mouth at bedtime as needed for muscle spasms. Patient taking differently: Take 500 mg by mouth at bedtime. 05/05/21  Yes Fargo, Amy E, NP  mirabegron ER (MYRBETRIQ) 25 MG TB24 tablet Take 25 mg by mouth every morning.   Yes [provider]  olmesartan (BENICAR) 40 MG tablet Take 1 tablet (40 mg total) by mouth daily. 01/27/21  Yes [name], MD  PRESCRIPTION MEDICATION Inhale into the lungs at bedtime. BIPAP   Yes [provider]  tamsulosin (FLOMAX) 0.4 MG CAPS capsule Take 1 capsule (0.4 mg total) by mouth daily after supper. 12/20/20  Yes [name], MD  vitamin B-12 (CYANOCOBALAMIN) 1000 MCG tablet Take 1,000 mcg by mouth every morning.   Yes [provider]  albuterol (VENTOLIN HFA) 108 (90 Base) MCG/ACT  inhaler Inhale 2 puffs into the lungs every 6 (six) hours as needed for wheezing or shortness of breath. Patient not taking: Reported on 05/28/2021 05/05/21   Yvonna Alanis, NP  azithromycin (ZITHROMAX) 250 MG tablet Take 250-500 mg by mouth See admin instructions. Take 2 tablets (500 mg) by mouth 1st day, then take 1 tablet (250 mg) daily on days 2-5 Patient not taking: Reported on 05/28/2021    [provider]  guaiFENesin 200 MG tablet Take 2 tablets (400 mg total) by mouth 2 (two) times daily. Patient not taking: Reported on 05/28/2021 05/05/21   Yvonna Alanis, NP  saccharomyces boulardii (FLORASTOR) 250 MG capsule Take 1 capsule (250 mg total) by mouth 2 (two) times daily for 9 days. Patient not taking: Reported on 05/28/2021 05/20/21 05/29/21  Medina-Vargas, Senaida Lange, NP     Critical care time: 35 minutes

## 2021-05-28 NOTE — Progress Notes (Signed)
PROGRESS NOTE    Kyle Lewis  NTI:[id number] DOB: 29-Jan-1946 DOA: 05/27/2021 PCP: [name], MD   Brief Narrative:  75 year old white male with a history of COPD, OSA, hypertension, prior history of AAA repair presents to the hospital today with worsening shortness of breath.  Patient been treated about 7 to 10 days ago with prednisone, antibiotics for presumed COPD exacerbation.  Patient 's wife called his PCP today due to patient having hypoxia at home with reported room air sats of 58%.  911 was activated.   On arrival sats were 94% on 4 L.  Blood pressure 175/95.   Patient denies any recent fever or chills.  He has been short of breath for 7 to 10 days.  He has had an occasional cough.  Patient states that his wife noticed ankle swelling the last 2 to 3 days.  He does not weigh himself.  He has no prior history of congestive heart failure.   Lab evaluation BNP was elevated to 1348.  No prior other BNP's are available in EMR.  COVID-negative.  Flu negative.   Serum CO2 34, BUN 23, creatinine 0.7   CBC white count 7.7, hemoglobin 16.6, platelets 186.   Chest x-ray demonstrated mild cardiomegaly.  Some trace effusions.  No frank edema.   [facility] hospitalist contacted for admission.    ED Course: BNP elevated. CXR without pulmonary edema. O2 sats 90% on 4 L  Assessment & Plan:   Principal Problem:   Acute CHF (congestive heart failure) (HCC) Active Problems:   OSA (obstructive sleep apnea)   Exogenous obesity   Tobacco use   Severe aortic stenosis by prior echocardiogram   Acute respiratory failure with hypoxia (HCC)  Acute respiratory failure with hypoxia secondary to acute on chronic diastolic congestive heart failure moderate to severe aortic stenosis: Previous echo done in August 2019 shows normal ejection fraction but grade 1 diastolic dysfunction.  Presents with CHF features, chest x-ray with pulmonary edema.  Bilateral lower extremity edema. Patient has known  moderate to severe aortic stenosis since 2019.  He has been lost to cardiac follow-up.  Last follow-up was in June 2020.  Likely his aortic stenosis has gotten worse now.  Echo pending, informal reading does suggest that he has severe aortic stenosis.  Cardiology is on board.  He will likely need TAVR work-up with structural cardiac team which is not possible until Tuesday due to holidays.  We will continue IV diuresis and monitor closely in the meantime and defer further management to cardiology.  He is still on 4 L of oxygen.  Diuresis is not decent yet.  Mild acute COPD exacerbation: He is wheezy on exam and I believe he has mild COPD exacerbation as well.  He received 1 dose of Solu-Medrol yesterday.  I will start him on prednisone for 4 more days and continue bronchodilators.   OSA (obstructive sleep apnea) Chronic.  Continue home BiPAP.   Morbid obesity: Weight loss counseling provided.   Tobacco abuse: Patient advised to stop smoking.  Hyperglycemia: We will check hemoglobin A1c.  Will start on SSI in the meantime.  BPH: Continue Flomax.  DVT prophylaxis: enoxaparin (LOVENOX) injection 40 mg Start: 05/27/21 2315 SCDs Start: 05/27/21 2310   Code Status: Full Code  Family Communication:  None present at bedside.  Plan of care discussed with patient in length and he verbalized understanding and agreed with it.  Status is: Inpatient  Remains inpatient appropriate because: Needs work-up for TAVR.  Estimated  body mass index is 36.58 kg/m as calculated from the following:   Height as of this encounter: 5\' 6"  (1.676 m).   Weight as of this encounter: 102.8 kg.   Nutritional Assessment: Body mass index is 36.58 kg/m.Marland Kitchen Seen by dietician.  I agree with the assessment and plan as outlined below: Nutrition Status:   Skin Assessment: I have examined the patient's skin and I agree with the wound assessment as performed by the wound care RN as outlined below:  Consultants:   Cardiology  Procedures:  None  Antimicrobials:  Anti-infectives (From admission, onward)    None          Subjective: Seen and examined.  He tells me that his breathing is slightly better than yesterday.  No new complaint.  Objective: Vitals:   05/28/21 0344 05/28/21 0537 05/28/21 0749 05/28/21 1121  BP: 113/73 111/68 139/81   Pulse: (!) 59 63 66 66  Resp: 18     Temp: 98.6 F (37 C)  97.7 F (36.5 C)   TempSrc:   Oral   SpO2: 95% 97%  94%  Weight: 102.8 kg     Height:        Intake/Output Summary (Last 24 hours) at 05/28/2021 1206 Last data filed at 05/28/2021 0902 Gross per 24 hour  Intake 240 ml  Output 550 ml  Net -310 ml   Filed Weights   05/27/21 1406 05/27/21 2312 05/28/21 0344  Weight: 99.8 kg 101.2 kg 102.8 kg    Examination:  General exam: Appears calm and comfortable, morbidly obese Respiratory system: Faint crackles at the bases bilaterally respiratory effort normal. Cardiovascular system: S1 & S2 heard, RRR. No JVD, rubs, gallops or clicks.  Loud systolic murmur.  +1-2 pitting edema bilateral lower extremity. Gastrointestinal system: Abdomen is nondistended, soft and nontender. No organomegaly or masses felt. Normal bowel sounds heard. Central nervous system: Alert and oriented. No focal neurological deficits. Extremities: Symmetric 5 x 5 power. Skin: No rashes, lesions or ulcers Psychiatry: Judgement and insight appear poor   Data Reviewed: I have personally reviewed following labs and imaging studies  CBC: Recent Labs  Lab 05/27/21 1420 05/28/21 0026  WBC 7.7 5.8  NEUTROABS 6.1 5.3  HGB 16.6 15.7  HCT 52.8* 48.4  MCV 101.5* 98.0  PLT 186 595   Basic Metabolic Panel: Recent Labs  Lab 05/27/21 1420 05/28/21 0026  NA 144 139  K 4.1 4.1  CL 104 97*  CO2 34* 36*  GLUCOSE 123* 210*  BUN 23 18  CREATININE 0.74 0.81   0.82  CALCIUM 8.4* 7.9*  MG  --  2.0   GFR: Estimated Creatinine Clearance: 88.5 mL/min (by C-G formula  based on SCr of 0.81 mg/dL). Liver Function Tests: Recent Labs  Lab 05/27/21 1420 05/28/21 0026  AST 19 22  ALT 31 29  ALKPHOS 56 48  BILITOT 0.8 1.1  PROT 6.2* 5.5*  ALBUMIN 3.7 3.3*   No results for input(s): LIPASE, AMYLASE in the last 168 hours. No results for input(s): AMMONIA in the last 168 hours. Coagulation Profile: No results for input(s): INR, PROTIME in the last 168 hours. Cardiac Enzymes: No results for input(s): CKTOTAL, CKMB, CKMBINDEX, TROPONINI in the last 168 hours. BNP (last 3 results) No results for input(s): PROBNP in the last 8760 hours. HbA1C: No results for input(s): HGBA1C in the last 72 hours. CBG: No results for input(s): GLUCAP in the last 168 hours. Lipid Profile: No results for input(s): CHOL, HDL, LDLCALC, TRIG, CHOLHDL,  LDLDIRECT in the last 72 hours. Thyroid Function Tests: No results for input(s): TSH, T4TOTAL, FREET4, T3FREE, THYROIDAB in the last 72 hours. Anemia Panel: No results for input(s): VITAMINB12, FOLATE, FERRITIN, TIBC, IRON, RETICCTPCT in the last 72 hours. Sepsis Labs: No results for input(s): PROCALCITON, LATICACIDVEN in the last 168 hours.  Recent Results (from the past 240 hour(s))  SARS-COV-2 RNA,(COVID-19) QUAL NAAT     Status: None   Collection Time: 05/20/21  5:05 PM  Result Value Ref Range Status   SARS CoV2 RNA NOT DETECTED NOT DETECTED Final    Comment: . A Not Detected result means that SARS-CoV-2 RNA was not present in the specimen above the limit of detection. . A Not Detected result does not rule out the possibility of COVID-19 and should not be used as the sole basis for treatment or patient management decisions. If COVID-19 is still suspected, based on exposure history together with other clinical findings, re-testing should be  considered in the context of clinical observations and epidemiological data for patient management decisions. . Test Method: Nucleic Acid Amplification Test including reverse  transcription polymerase chain reaction (RT-PCR) and transcription mediated amplification (TMA). The test method meets the [place] Centers for Disease Control and prevention (CDC) pre departure and arrival requirement for viral test for COVID-19 dated July 03, 2019. Testing requirements for traveling may change with time. The patient is responsible for determining the test requirements for each nation while they ar e traveling. . This test has been authorized by the FDA under an  Emergency Use Authorization (EUA) for use by authorized laboratories. . Please review the "Fact Sheets" and FDA authorized labeling available for health care providers and patients using the following websites: https://www.[facility].com/home/Covid-19/HCP/NAAT/fact-sheet2  https://www.[facility].com/home/Covid-19/Patients/NAAT/ fact-sheet2 . Due to the current public health emergency, [facility] is accepting samples from appropriate clinical sources collected using wide variety of swabs and transport media for COVID-19. Not detected test results derived from specimens received in non- commercially manufactured viral collection kits or those not yet authorized by FDA for COVID-19 testing should be cautiously evaluated and take extra precautions such as additional clinical monitoring, including collection of an additional specimen. . Additional information about COVID-19 can be fo und at the [employer] website: www.[facility].com/Covid19. . For patients with a Detected or Inconclusive test result, please see CDC's COVID-19 Treatments and  Medications page located at  BroadcastLocal.cz- severe-illness.html for information on COVID-19 therapeutics. . For patients with a Not Detected test result, please see CDC's Vaccines for COVID-19 page located at PhoneStatistics.is for  information on COVID-19 vaccines.   Resp Panel by RT-PCR (Flu A&B, Covid) Nasopharyngeal Swab     Status: None   Collection Time: 05/27/21  2:08 PM   Specimen: Nasopharyngeal Swab; Nasopharyngeal(NP) swabs in vial transport medium  Result Value Ref Range Status   SARS Coronavirus 2 by RT PCR NEGATIVE NEGATIVE Final    Comment: (NOTE) SARS-CoV-2 target nucleic acids are NOT DETECTED.  The SARS-CoV-2 RNA is generally detectable in upper respiratory specimens during the acute phase of infection. The lowest concentration of SARS-CoV-2 viral copies this assay can detect is 138 copies/mL. A negative result does not preclude SARS-Cov-2 infection and should not be used as the sole basis for treatment or other patient management decisions. A negative result may occur with  improper specimen collection/handling, submission of specimen other than nasopharyngeal swab, presence of viral mutation(s) within the areas targeted by this assay, and inadequate number of viral copies(<138 copies/mL). A negative result must be combined  with clinical observations, patient history, and epidemiological information. The expected result is Negative.  Fact Sheet for Patients:  EntrepreneurPulse.com.au  Fact Sheet for Healthcare Providers:  IncredibleEmployment.be  This test is no t yet approved or cleared by the [place] FDA and  has been authorized for detection and/or diagnosis of SARS-CoV-2 by FDA under an Emergency Use Authorization (EUA). This EUA will remain  in effect (meaning this test can be used) for the duration of the COVID-19 declaration under Section 564(b)(1) of the Act, 21 U.S.C.section 360bbb-3(b)(1), unless the authorization is terminated  or revoked sooner.       Influenza A by PCR NEGATIVE NEGATIVE Final   Influenza B by PCR NEGATIVE NEGATIVE Final    Comment: (NOTE) The Xpert Xpress SARS-CoV-2/FLU/RSV plus assay is intended as an aid in the  diagnosis of influenza from Nasopharyngeal swab specimens and should not be used as a sole basis for treatment. Nasal washings and aspirates are unacceptable for Xpert Xpress SARS-CoV-2/FLU/RSV testing.  Fact Sheet for Patients: EntrepreneurPulse.com.au  Fact Sheet for Healthcare Providers: IncredibleEmployment.be  This test is not yet approved or cleared by the [place] FDA and has been authorized for detection and/or diagnosis of SARS-CoV-2 by FDA under an Emergency Use Authorization (EUA). This EUA will remain in effect (meaning this test can be used) for the duration of the COVID-19 declaration under Section 564(b)(1) of the Act, 21 U.S.C. section 360bbb-3(b)(1), unless the authorization is terminated or revoked.  Performed at [facility], [place] [address], [place],  [address]       Radiology Studies: DG Chest 2 View  Result Date: 05/27/2021 CLINICAL DATA:  Shortness of breath, cough EXAM: CHEST - 2 VIEW COMPARISON:  04/07/2019 FINDINGS: Eventration of the right hemidiaphragm. Small right and trace left pleural effusions. Associated bibasilar atelectasis. No frank interstitial edema.  Mild cardiomegaly.  No pneumothorax. IMPRESSION: Mild cardiomegaly with small right and trace left pleural effusions. Associated bibasilar atelectasis. No frank interstitial edema. Electronically Signed   By: Julian Hy M.D.   On: 05/27/2021 14:30    Scheduled Meds:  enoxaparin (LOVENOX) injection  40 mg Subcutaneous QHS   feeding supplement  237 mL Oral BID BM   finasteride  5 mg Oral Daily   furosemide  40 mg Intravenous Q12H   gabapentin  300 mg Oral BID   mirabegron ER  25 mg Oral Daily   nitroGLYCERIN  1 inch Topical Q6H   tamsulosin  0.4 mg Oral QPC supper   Continuous Infusions:   LOS: 1 day   Time spent: 36 minutes   Darliss Cheney, MD [facility]  05/28/2021, 12:06 PM  Please page via [facility] and  do not message via secure chat for anything urgent. Secure chat can be used for anything non urgent.  How to contact the [facility] Attending or Consulting provider [place] or covering provider during after hours [place], for this patient?  Check the care team in [facility] and look for a) attending/consulting [facility] provider listed and b) the [facility] team listed. Page or secure chat [phone]. Log into [email or web address] and use [place]'s universal password to access. If you do not have the password, please contact the hospital operator. Locate the [facility] provider you are looking for under [facility] and page to a number that you can be directly reached. If you still have difficulty reaching the provider, please page the [facility] (Director on Call) for the Hospitalists listed on amion for assistance.

## 2021-05-28 NOTE — Progress Notes (Signed)
Patient placed on cpap at this time for napping.

## 2021-05-28 NOTE — Significant Event (Signed)
Rapid Response Event Note   Reason for Call :  ABG  7.157/ >120/ 80.5/ 43.9  Initial Focused Assessment:  Pt lying in bed. He will briefly open his eyes to verbal stimuli, but does not answer orientation questions. Skin is warm, pink. Face is red. Mild accessory muscle use prior to being placed on BiPAP. Lung sounds are diminished. Pt moves extremities appropriately.   VS: T 97.27F, BP 136/77, HR 77, RR 22, SpO2 95% on room air CBG: 116  Interventions:  -BIPAP -PCCM consult  Plan of Care:  -Repeat ABG 1-2 hours following BiPAP initiation -Frequent reevaluation of pt mentation -Aspiration precautions, NPO while requiring BiPAP  Call rapid response for additional needs  Event Summary:  MD Notified: Pahwani Call Time: [place] Time: [place] Time: 1920  Casimer Bilis, RN

## 2021-05-29 DIAGNOSIS — I5033 Acute on chronic diastolic (congestive) heart failure: Secondary | ICD-10-CM | POA: Diagnosis not present

## 2021-05-29 DIAGNOSIS — I350 Nonrheumatic aortic (valve) stenosis: Secondary | ICD-10-CM | POA: Diagnosis not present

## 2021-05-29 DIAGNOSIS — I509 Heart failure, unspecified: Secondary | ICD-10-CM | POA: Diagnosis not present

## 2021-05-29 DIAGNOSIS — J9601 Acute respiratory failure with hypoxia: Secondary | ICD-10-CM | POA: Diagnosis not present

## 2021-05-29 DIAGNOSIS — J9602 Acute respiratory failure with hypercapnia: Secondary | ICD-10-CM | POA: Diagnosis not present

## 2021-05-29 DIAGNOSIS — E6609 Other obesity due to excess calories: Secondary | ICD-10-CM | POA: Diagnosis not present

## 2021-05-29 LAB — BLOOD GAS, ARTERIAL
Acid-Base Excess: 19.7 mmol/L — ABNORMAL HIGH (ref 0.0–2.0)
Bicarbonate: 46.1 mmol/L — ABNORMAL HIGH (ref 20.0–28.0)
Drawn by: 28099
FIO2: 50
O2 Saturation: 96.1 %
Patient temperature: 37
pCO2 arterial: 81.8 mmHg (ref 32.0–48.0)
pH, Arterial: 7.37 (ref 7.350–7.450)
pO2, Arterial: 80.6 mmHg — ABNORMAL LOW (ref 83.0–108.0)

## 2021-05-29 LAB — BLOOD GAS, VENOUS
Acid-Base Excess: 17.4 mmol/L — ABNORMAL HIGH (ref 0.0–2.0)
Acid-Base Excess: 18.7 mmol/L — ABNORMAL HIGH (ref 0.0–2.0)
Bicarbonate: 44.9 mmol/L — ABNORMAL HIGH (ref 20.0–28.0)
Bicarbonate: 46.9 mmol/L — ABNORMAL HIGH (ref 20.0–28.0)
Drawn by: 1679
Drawn by: 2585
O2 Saturation: 49.9 %
O2 Saturation: 83 %
Patient temperature: 36.4
Patient temperature: 37
pCO2, Ven: 101 mmHg (ref 44.0–60.0)
pCO2, Ven: 115 mmHg (ref 44.0–60.0)
pH, Ven: 7.229 — ABNORMAL LOW (ref 7.250–7.430)
pH, Ven: 7.27 (ref 7.250–7.430)
pO2, Ven: 28.9 mmHg — CL (ref 32.0–45.0)
pO2, Ven: 51.5 mmHg — ABNORMAL HIGH (ref 32.0–45.0)

## 2021-05-29 LAB — BASIC METABOLIC PANEL
Anion gap: 5 (ref 5–15)
BUN: 22 mg/dL (ref 8–23)
CO2: 42 mmol/L — ABNORMAL HIGH (ref 22–32)
Calcium: 8.2 mg/dL — ABNORMAL LOW (ref 8.9–10.3)
Chloride: 96 mmol/L — ABNORMAL LOW (ref 98–111)
Creatinine, Ser: 1.16 mg/dL (ref 0.61–1.24)
GFR, Estimated: >60 mL/min (ref >60)
Glucose, Bld: 127 mg/dL — ABNORMAL HIGH (ref 70–99)
Potassium: 4.6 mmol/L (ref 3.5–5.1)
Sodium: 143 mmol/L (ref 135–145)

## 2021-05-29 LAB — GLUCOSE, CAPILLARY
Glucose-Capillary: 108 mg/dL — ABNORMAL HIGH (ref 70–99)
Glucose-Capillary: 147 mg/dL — ABNORMAL HIGH (ref 70–99)
Glucose-Capillary: 120 mg/dL — ABNORMAL HIGH (ref 70–99)
Glucose-Capillary: 189 mg/dL — ABNORMAL HIGH (ref 70–99)

## 2021-05-29 LAB — MAGNESIUM: Magnesium: 2.3 mg/dL (ref 1.7–2.4)

## 2021-05-29 NOTE — Progress Notes (Signed)
Progress Note  Patient Name: Kyle Lewis Date of Encounter: 05/29/2021  [facility] Cardiologist: Jenean Lindau, MD   Subjective   Net -2.1 L yesterday.  BP 123/59 this morning.  Weight is down 5 pounds from yesterday.  Mild bump in creatinine (0.81 > 1.16).  Developed confusion overnight and ABG showed profound respiratory acidosis (PCO2 greater than 120, pH 7.157).  PCCM consulted, he is improving on Bipap (most recent ABG 7.27/101/52/45).  He reports dyspnea improved today, denies any chest  Inpatient Medications    Scheduled Meds:  budesonide (PULMICORT) nebulizer solution  0.25 mg Nebulization BID   enoxaparin (LOVENOX) injection  40 mg Subcutaneous QHS   feeding supplement  237 mL Oral BID BM   finasteride  5 mg Oral Daily   furosemide  40 mg Intravenous BID   gabapentin  300 mg Oral BID   insulin aspart  0-5 Units Subcutaneous QHS   insulin aspart  0-9 Units Subcutaneous TID WC   ipratropium-albuterol  3 mL Nebulization Q6H   mirabegron ER  25 mg Oral Daily   predniSONE  50 mg Oral Q breakfast   tamsulosin  0.4 mg Oral QPC supper   Continuous Infusions:  PRN Meds: acetaminophen **OR** acetaminophen, ondansetron **OR** ondansetron (ZOFRAN) IV   Vital Signs    Vitals:   05/29/21 0800 05/29/21 0808 05/29/21 0834 05/29/21 0842  BP: (!) 123/59     Pulse: 66   75  Resp:      Temp:  98.8 F (37.1 C)    TempSrc:  Oral    SpO2:   97% 96%  Weight:      Height:        Intake/Output Summary (Last 24 hours) at 05/29/2021 0928 Last data filed at 05/29/2021 0300 Gross per 24 hour  Intake 180 ml  Output 1700 ml  Net -1520 ml   Last 3 Weights 05/29/2021 05/28/2021 05/27/2021  Weight (lbs) 222 lb 3.6 oz 226 lb 10.1 oz 223 lb 1.7 oz  Weight (kg) 100.8 kg 102.8 kg 101.2 kg      Telemetry    Normal sinus rhythm with rate 60s to 70s- Personally Reviewed  ECG    No new ECG- Personally Reviewed  Physical Exam   GEN: On Bipap, appears  comfortable Neck: +JVD Cardiac: RRR, 3/6 systolic murmur Respiratory: Expiratory wheezing GI: Soft, nontender MS: No edema; No deformity. Neuro:  Nonfocal  Psych: Normal affect   Labs    High Sensitivity Troponin:   Recent Labs  Lab 05/27/21 1420 05/27/21 1904  TROPONINIHS 58* 54*     Chemistry Recent Labs  Lab 05/27/21 1420 05/28/21 0026 05/29/21 0015  NA 144 139 143  K 4.1 4.1 4.6  CL 104 97* 96*  CO2 34* 36* 42*  GLUCOSE 123* 210* 127*  BUN 23 18 22   CREATININE 0.74 0.81   0.82 1.16  CALCIUM 8.4* 7.9* 8.2*  MG  --  2.0 2.3  PROT 6.2* 5.5*  --   ALBUMIN 3.7 3.3*  --   AST 19 22  --   ALT 31 29  --   ALKPHOS 56 48  --   BILITOT 0.8 1.1  --   GFRNONAA >60 >60   >60 >60  ANIONGAP 6 6 5     Lipids No results for input(s): CHOL, TRIG, HDL, LABVLDL, LDLCALC, CHOLHDL in the last 168 hours.  Hematology Recent Labs  Lab 05/27/21 1420 05/28/21 0026  WBC 7.7 5.8  RBC 5.20 4.94  HGB 16.6 15.7  HCT 52.8* 48.4  MCV 101.5* 98.0  MCH 31.9 31.8  MCHC 31.4 32.4  RDW 14.5 14.2  PLT 186 167   Thyroid No results for input(s): TSH, FREET4 in the last 168 hours.  BNP Recent Labs  Lab 05/27/21 1420  BNP 1,348.7*    DDimer No results for input(s): DDIMER in the last 168 hours.   Radiology    DG Chest 2 View  Result Date: 05/27/2021 CLINICAL DATA:  Shortness of breath, cough EXAM: CHEST - 2 VIEW COMPARISON:  04/07/2019 FINDINGS: Eventration of the right hemidiaphragm. Small right and trace left pleural effusions. Associated bibasilar atelectasis. No frank interstitial edema.  Mild cardiomegaly.  No pneumothorax. IMPRESSION: Mild cardiomegaly with small right and trace left pleural effusions. Associated bibasilar atelectasis. No frank interstitial edema. Electronically Signed   By: Julian Hy M.D.   On: 05/27/2021 14:30   ECHOCARDIOGRAM COMPLETE  Result Date: 05/28/2021    ECHOCARDIOGRAM REPORT   Patient Name:   Kyle Lewis Date of Exam: 05/28/2021  Medical Rec #:  [id number]       Height:       66.0 in Accession #:    [id number]      Weight:       226.6 lb Date of Birth:  [birth year]      BSA:          2.109 m Patient Age:    75 years        BP:           139/81 mmHg Patient Gender: M               HR:           71 bpm. Exam Location:  Inpatient Procedure: 2D Echo, Cardiac Doppler and Color Doppler Indications:    CHF  History:        Patient has prior history of Echocardiogram examinations, most                 recent 01/30/2018. CAD and Previous Myocardial Infarction, COPD,                 Aortic Valve Disease; Risk Factors:Former Smoker, Hypertension                 and Dyslipidemia. Hx stroke.  Sonographer:    Clayton Lefort RDCS (AE) Referring Phys: [place]  1. Left ventricular ejection fraction, by estimation, is approximately 55%. The left ventricle has normal function. Left ventricular endocardial border not optimally defined to evaluate regional wall motion. There is severe concentric left ventricular hypertrophy. Left ventricular diastolic parameters are consistent with Grade I diastolic dysfunction (impaired relaxation). Elevated left ventricular end-diastolic pressure.  2. Right ventricular systolic function is normal. The right ventricular size is normal. There is mildly elevated pulmonary artery systolic pressure. The estimated right ventricular systolic pressure is 62.1 mmHg.  3. Left atrial size was mildly dilated.  4. Right atrial size was mildly dilated.  5. There is trivial pericardial effusion surrounding the apex.  6. The mitral valve is degenerative. Moderate mitral valve regurgitation and very eccentric. The mean mitral valve gradient is 3.0 mmHg. Moderate mitral annular calcification.  7. Tricuspid valve regurgitation is moderate.  8. The aortic valve is tricuspid. There is severe calcifcation of the aortic valve. Aortic valve regurgitation is not visualized. Severe aortic valve stenosis. Aortic valve mean gradient measures  56.5 mmHg. Dimentionless index 0.19.  9. The inferior vena  cava is dilated in size with <50% respiratory variability, suggesting right atrial pressure of 15 mmHg. Comparison(s): Prior images reviewed side by side. Aortic stenosis has progressed and in severe range. Mitral regurgitation is very eccentric and likely moderate. FINDINGS  Left Ventricle: Left ventricular ejection fraction, by estimation, is 55%. The left ventricle has normal function. Left ventricular endocardial border not optimally defined to evaluate regional wall motion. The left ventricular internal cavity size was normal in size. There is severe concentric left ventricular hypertrophy. Left ventricular diastolic parameters are consistent with Grade I diastolic dysfunction (impaired relaxation). Elevated left ventricular end-diastolic pressure. Right Ventricle: The right ventricular size is normal. No increase in right ventricular wall thickness. Right ventricular systolic function is normal. There is mildly elevated pulmonary artery systolic pressure. The tricuspid regurgitant velocity is 2.37  m/s, and with an assumed right atrial pressure of 15 mmHg, the estimated right ventricular systolic pressure is 25.8 mmHg. Left Atrium: Left atrial size was mildly dilated. Right Atrium: Right atrial size was mildly dilated. Pericardium: Trivial pericardial effusion is present. The pericardial effusion is surrounding the apex. Mitral Valve: The mitral valve is degenerative in appearance. There is mild thickening of the mitral valve leaflet(s). There is mild calcification of the mitral valve leaflet(s). Moderate mitral annular calcification. Moderate mitral valve regurgitation.  MV peak gradient, 4.9 mmHg. The mean mitral valve gradient is 3.0 mmHg. Tricuspid Valve: The tricuspid valve is grossly normal. Tricuspid valve regurgitation is moderate. Aortic Valve: The aortic valve is tricuspid. There is severe calcifcation of the aortic valve. Aortic valve  regurgitation is not visualized. Severe aortic stenosis is present. Aortic valve mean gradient measures 56.5 mmHg. Aortic valve peak gradient measures 93.5 mmHg. Aortic valve area, by VTI measures 0.61 cm. Pulmonic Valve: The pulmonic valve was grossly normal. Pulmonic valve regurgitation is trivial. Aorta: The aortic root is normal in size and structure. Venous: The inferior vena cava is dilated in size with less than 50% respiratory variability, suggesting right atrial pressure of 15 mmHg. IAS/Shunts: No atrial level shunt detected by color flow Doppler.  LEFT VENTRICLE PLAX 2D LVIDd:         4.10 cm   Diastology LVIDs:         2.80 cm   LV e' medial:    4.35 cm/s LV PW:         2.50 cm   LV E/e' medial:  18.2 LV IVS:        2.00 cm   LV e' lateral:   6.85 cm/s LVOT diam:     2.00 cm   LV E/e' lateral: 11.5 LV SV:         67 LV SV Index:   32 LVOT Area:     3.14 cm  RIGHT VENTRICLE             IVC RV Basal diam:  4.40 cm     IVC diam: 2.80 cm RV Mid diam:    3.40 cm RV S prime:     17.80 cm/s TAPSE (M-mode): 2.6 cm LEFT ATRIUM             Index        RIGHT ATRIUM           Index LA diam:        3.90 cm 1.85 cm/m   RA Area:     23.90 cm LA Vol (A2C):   82.8 ml 39.26 ml/m  RA Volume:   79.30 ml  37.60  ml/m LA Vol (A4C):   76.5 ml 36.27 ml/m LA Biplane Vol: 82.5 ml 39.12 ml/m  AORTIC VALVE AV Area (Vmax):    0.59 cm AV Area (Vmean):   0.58 cm AV Area (VTI):     0.61 cm AV Vmax:           483.50 cm/s AV Vmean:          352.000 cm/s AV VTI:            1.105 m AV Peak Grad:      93.5 mmHg AV Mean Grad:      56.5 mmHg LVOT Vmax:         90.10 cm/s LVOT Vmean:        65.500 cm/s LVOT VTI:          0.213 m LVOT/AV VTI ratio: 0.19  AORTA Ao Root diam: 3.50 cm Ao Asc diam:  3.10 cm MITRAL VALVE                  TRICUSPID VALVE MV Area (PHT): 2.42 cm       TR Peak grad:   22.5 mmHg MV Area VTI:   2.14 cm       TR Vmax:        237.00 cm/s MV Peak grad:  4.9 mmHg MV Mean grad:  3.0 mmHg       SHUNTS MV Vmax:        1.11 m/s       Systemic VTI:  0.21 m MV Vmean:      76.2 cm/s      Systemic Diam: 2.00 cm MV Decel Time: 313 msec MR Peak grad:    64.0 mmHg MR Mean grad:    39.0 mmHg MR Vmax:         400.00 cm/s MR Vmean:        290.0 cm/s MR PISA:         1.57 cm MR PISA Eff ROA: 12 mm MR PISA Radius:  0.50 cm MV E velocity: 79.10 cm/s MV A velocity: 101.00 cm/s MV E/A ratio:  0.78 Rozann Lesches MD Electronically signed by Rozann Lesches MD Signature Date/Time: 05/28/2021/2:45:46 PM    Final     Cardiac Studies   1. Left ventricular ejection fraction, by estimation, is approximately  55%. The left ventricle has normal function. Left ventricular endocardial  border not optimally defined to evaluate regional wall motion. There is  severe concentric left ventricular  hypertrophy. Left ventricular diastolic parameters are consistent with  Grade I diastolic dysfunction (impaired relaxation). Elevated left  ventricular end-diastolic pressure.   2. Right ventricular systolic function is normal. The right ventricular  size is normal. There is mildly elevated pulmonary artery systolic  pressure. The estimated right ventricular systolic pressure is 56.4 mmHg.   3. Left atrial size was mildly dilated.   4. Right atrial size was mildly dilated.   5. There is trivial pericardial effusion surrounding the apex.   6. The mitral valve is degenerative. Moderate mitral valve regurgitation  and very eccentric. The mean mitral valve gradient is 3.0 mmHg. Moderate  mitral annular calcification.   7. Tricuspid valve regurgitation is moderate.   8. The aortic valve is tricuspid. There is severe calcifcation of the  aortic valve. Aortic valve regurgitation is not visualized. Severe aortic  valve stenosis. Aortic valve mean gradient measures 56.5 mmHg.  Dimentionless index 0.19.   9. The inferior vena cava is dilated in size with <50%  respiratory  variability, suggesting right atrial pressure of 15 mmHg.   Patient Profile      75 y.o. male with a hx of CAD rx medically, AAA s/p repair, CKD III, AS, carotid dz, OSA on CPAP, CVA, COPD, HTN who is being seen for the evaluation of AS  Assessment & Plan    Acute respiratory failure with hypoxia and hypercapnia: Likely multifactorial with diastolic heart failure due to severe aortic stenosis and COPD and OHS/OSA contributing.  Developed confusion on 12/24 and ABG showed profound respiratory acidosis (PCO2 greater than 120, pH 7.157).  PCCM consulted, he is improving on Bipap (most recent ABG 7.27/101/52/45).  Continue diuresis.  On steroids, nebulizers per PCCM.  Severe aortic stenosis: Echo 05/28/2021 showed severe aortic stenosis (V-max 4.9 m/s, mean gradient 59 mmHg, AVA 0.6 cm, DI 0.18).   - Continue IV Lasix.  Will be cautious to avoid overdiuresis in setting of severe AS, but would continue IV Lasix for now  -Will plan for RHC/LHC once respiratory status improved.  Plan for structural heart team consult for TAVR evaluation this admission   Acute on chronic diastolic CHF: Presented with volume overload in setting of severe aortic stenosis as above - Continue IV Lasix.  Will be cautious to avoid overdiuresis in setting of severe AS, but would continue IV Lasix for now   Mitral regurgitation: Very eccentric jet, and not well visualized on TTE.  Low E wave velocity with E less than A argues against severe MR.  Suspect likely moderate MR and can monitor for improvement following treatment of his severe AS  For questions or updates, please contact [place] Please consult [email or web address] for contact info under        Signed, Donato Heinz, MD  05/29/2021, 9:28 AM

## 2021-05-29 NOTE — Progress Notes (Signed)
Pt removed from BIPAP and placed on 5L [place]. WOB WNL. Pt is awake and alert.

## 2021-05-29 NOTE — Progress Notes (Signed)
PROGRESS NOTE    Kyle Lewis  XKG:[id number] DOB: Mar 20, 1946 DOA: 05/27/2021 PCP: [name], MD   Brief Narrative:  75 year old white male with a history of COPD, OSA, hypertension, prior history of AAA repair presents to the hospital today with worsening shortness of breath.  Patient been treated about 7 to 10 days ago with prednisone, antibiotics for presumed COPD exacerbation.  Patient 's wife called his PCP today due to patient having hypoxia at home with reported room air sats of 58%.  911 was activated.   On arrival sats were 94% on 4 L.  Blood pressure 175/95.   Patient denies any recent fever or chills.  He has been short of breath for 7 to 10 days.  He has had an occasional cough.  Patient states that his wife noticed ankle swelling the last 2 to 3 days.  He does not weigh himself.  He has no prior history of congestive heart failure.   Lab evaluation BNP was elevated to 1348.  No prior other BNP's are available in EMR.  COVID-negative.  Flu negative.   Serum CO2 34, BUN 23, creatinine 0.7   CBC white count 7.7, hemoglobin 16.6, platelets 186.   Chest x-ray demonstrated mild cardiomegaly.  Some trace effusions.  No frank edema.   [facility] hospitalist contacted for admission.    ED Course: BNP elevated. CXR without pulmonary edema. O2 sats 90% on 4 L  Assessment & Plan:   Principal Problem:   Acute CHF (congestive heart failure) (HCC) Active Problems:   OSA (obstructive sleep apnea)   Exogenous obesity   Tobacco use   Severe aortic stenosis   Acute respiratory failure with hypoxia and hypercapnia (HCC)   Acute on chronic diastolic heart failure (HCC)  Acute respiratory failure with hypoxia secondary to acute on chronic diastolic congestive heart failure moderate to severe aortic stenosis/respiratory acidosis: Previous echo done in August 2019 shows normal ejection fraction but grade 1 diastolic dysfunction.  Presents with CHF features, chest x-ray with  pulmonary edema.  Bilateral lower extremity edema. Cardiology is on board.Repeat echo shows severe aortic stenosis.  He will likely need TAVR work-up with structural cardiac team which is not possible until Tuesday due to holidays.  Last evening, patient's respiratory status got worse, ABG showed significant retention of CO2 and profound respiratory acidosis.  He was started on BiPAP, PCCM was consulted, ABGs now improving.  This morning he was on BiPAP but is still alert and oriented.  Repeating ABG now.  Continuing diuresis and rest of the cardiac management per cardiology team.  Mild acute COPD exacerbation: Wheezing has improved.  Still on BiPAP.  Continue prednisone, bronchodilators and antibiotics.   OSA (obstructive sleep apnea) Chronic.  Continue home BiPAP.   Morbid obesity: Weight loss counseling provided.   Tobacco abuse: Patient advised to stop smoking.  Prediabetes: Hemoglobin A1c 5.8.  Continue SSI.  BPH: Continue Flomax.  DVT prophylaxis: enoxaparin (LOVENOX) injection 40 mg Start: 05/27/21 2315 SCDs Start: 05/27/21 2310   Code Status: Full Code  Family Communication:  None present at bedside.  Updated his wife over the phone.  Status is: Inpatient  Remains inpatient appropriate because: Needs work-up for TAVR.  Estimated body mass index is 35.87 kg/m as calculated from the following:   Height as of this encounter: 5\' 6"  (1.676 m).   Weight as of this encounter: 100.8 kg.   Nutritional Assessment: Body mass index is 35.87 kg/m.Marland Kitchen Seen by dietician.  I agree with the  assessment and plan as outlined below: Nutrition Status:   Skin Assessment: I have examined the patient's skin and I agree with the wound assessment as performed by the wound care RN as outlined below:  Consultants:  Cardiology  Procedures:  None  Antimicrobials:  Anti-infectives (From admission, onward)    None          Subjective:  Seen and examined.  Still on BiPAP but alert and  oriented.  Denies any complaints.  Objective: Vitals:   05/29/21 0834 05/29/21 0842 05/29/21 1100 05/29/21 1120  BP:   (!) 159/80   Pulse:  75 69 66  Resp:      Temp:      TempSrc:      SpO2: 97% 96% 92% 91%  Weight:      Height:        Intake/Output Summary (Last 24 hours) at 05/29/2021 1359 Last data filed at 05/29/2021 1119 Gross per 24 hour  Intake 180 ml  Output 2700 ml  Net -2520 ml    Filed Weights   05/27/21 2312 05/28/21 0344 05/29/21 0452  Weight: 101.2 kg 102.8 kg 100.8 kg    Examination:  General exam: Appears calm and comfortable, morbidly obese Respiratory system: Bilateral scattered rhonchi, on BiPAP, Cardiovascular system: S1 & S2 heard, RRR. No JVD, murmurs, rubs, gallops or clicks.  +1 pitting edema bilateral lower extremity. Gastrointestinal system: Abdomen is nondistended, soft and nontender. No organomegaly or masses felt. Normal bowel sounds heard. Central nervous system: Alert and oriented. No focal neurological deficits. Extremities: Symmetric 5 x 5 power. Skin: No rashes, lesions or ulcers.  Psychiatry: Judgement and insight appear poor  Data Reviewed: I have personally reviewed following labs and imaging studies  CBC: Recent Labs  Lab 05/27/21 1420 05/28/21 0026  WBC 7.7 5.8  NEUTROABS 6.1 5.3  HGB 16.6 15.7  HCT 52.8* 48.4  MCV 101.5* 98.0  PLT 186 638    Basic Metabolic Panel: Recent Labs  Lab 05/27/21 1420 05/28/21 0026 05/29/21 0015  NA 144 139 143  K 4.1 4.1 4.6  CL 104 97* 96*  CO2 34* 36* 42*  GLUCOSE 123* 210* 127*  BUN 23 18 22   CREATININE 0.74 0.81   0.82 1.16  CALCIUM 8.4* 7.9* 8.2*  MG  --  2.0 2.3    GFR: Estimated Creatinine Clearance: 61.2 mL/min (by C-G formula based on SCr of 1.16 mg/dL). Liver Function Tests: Recent Labs  Lab 05/27/21 1420 05/28/21 0026  AST 19 22  ALT 31 29  ALKPHOS 56 48  BILITOT 0.8 1.1  PROT 6.2* 5.5*  ALBUMIN 3.7 3.3*    No results for input(s): LIPASE, AMYLASE in  the last 168 hours. No results for input(s): AMMONIA in the last 168 hours. Coagulation Profile: No results for input(s): INR, PROTIME in the last 168 hours. Cardiac Enzymes: No results for input(s): CKTOTAL, CKMB, CKMBINDEX, TROPONINI in the last 168 hours. BNP (last 3 results) No results for input(s): PROBNP in the last 8760 hours. HbA1C: Recent Labs    05/28/21 0026  HGBA1C 5.8*   CBG: Recent Labs  Lab 05/28/21 1806 05/28/21 2203 05/29/21 0616 05/29/21 1122  GLUCAP 116* 193* 108* 147*   Lipid Profile: No results for input(s): CHOL, HDL, LDLCALC, TRIG, CHOLHDL, LDLDIRECT in the last 72 hours. Thyroid Function Tests: No results for input(s): TSH, T4TOTAL, FREET4, T3FREE, THYROIDAB in the last 72 hours. Anemia Panel: No results for input(s): VITAMINB12, FOLATE, FERRITIN, TIBC, IRON, RETICCTPCT in the last 72  hours. Sepsis Labs: No results for input(s): PROCALCITON, LATICACIDVEN in the last 168 hours.  Recent Results (from the past 240 hour(s))  SARS-COV-2 RNA,(COVID-19) QUAL NAAT     Status: None   Collection Time: 05/20/21  5:05 PM  Result Value Ref Range Status   SARS CoV2 RNA NOT DETECTED NOT DETECTED Final    Comment: . A Not Detected result means that SARS-CoV-2 RNA was not present in the specimen above the limit of detection. . A Not Detected result does not rule out the possibility of COVID-19 and should not be used as the sole basis for treatment or patient management decisions. If COVID-19 is still suspected, based on exposure history together with other clinical findings, re-testing should be  considered in the context of clinical observations and epidemiological data for patient management decisions. . Test Method: Nucleic Acid Amplification Test including reverse transcription polymerase chain reaction (RT-PCR) and transcription mediated amplification (TMA). The test method meets the [place] Centers for Disease Control and prevention (CDC) pre departure and  arrival requirement for viral test for COVID-19 dated July 03, 2019. Testing requirements for traveling may change with time. The patient is responsible for determining the test requirements for each nation while they ar e traveling. . This test has been authorized by the FDA under an  Emergency Use Authorization (EUA) for use by authorized laboratories. . Please review the "Fact Sheets" and FDA authorized labeling available for health care providers and patients using the following websites: https://[email or web address]/fact-[email or web address]  [email or web address]://[email or web address]/home/[email or web address]-19/[email or web address]/ fact-sheet2 . Due to the current public health emergency, [facility] is accepting samples from appropriate clinical sources collected using wide variety of swabs and transport media for COVID-19. Not detected test results derived from specimens received in non- commercially manufactured viral collection kits or those not yet authorized by FDA for COVID-19 testing should be cautiously evaluated and take extra precautions such as additional clinical monitoring, including collection of an additional specimen. . Additional information about COVID-19 can be fo und at the [employer] website: www.[email or web address].com/Covid19. . For patients with a Detected or Inconclusive test result, please see CDC's COVID-19 Treatments and  Medications page located at  BroadcastLocal.cz- severe-illness.html for information on COVID-19 therapeutics. . For patients with a Not Detected test result, please see CDC's Vaccines for COVID-19 page located at PhoneStatistics.is for information on COVID-19 vaccines.   Resp Panel by RT-PCR (Flu A&B, Covid) Nasopharyngeal Swab     Status: None   Collection Time: 05/27/21  2:08 PM   Specimen: Nasopharyngeal Swab; Nasopharyngeal(NP)  swabs in vial transport medium  Result Value Ref Range Status   SARS Coronavirus 2 by RT PCR NEGATIVE NEGATIVE Final    Comment: (NOTE) SARS-CoV-2 target nucleic acids are NOT DETECTED.  The SARS-CoV-2 RNA is generally detectable in upper respiratory specimens during the acute phase of infection. The lowest concentration of SARS-CoV-2 viral copies this assay can detect is 138 copies/mL. A negative result does not preclude SARS-Cov-2 infection and should not be used as the sole basis for treatment or other patient management decisions. A negative result may occur with  improper specimen collection/handling, submission of specimen other than nasopharyngeal swab, presence of viral mutation(s) within the areas targeted by this assay, and inadequate number of viral copies(<138 copies/mL). A negative result must be combined with clinical observations, patient history, and epidemiological information. The expected result is Negative.  Fact Sheet for Patients:  EntrepreneurPulse.com.au  Fact Sheet for Healthcare Providers:  IncredibleEmployment.be  This test is no t yet approved or cleared by  the [employer] and  has been authorized for detection and/or diagnosis of SARS-CoV-2 by FDA under an Emergency Use Authorization (EUA). This EUA will remain  in effect (meaning this test can be used) for the duration of the COVID-19 declaration under Section 564(b)(1) of the Act, 21 U.S.C.section 360bbb-3(b)(1), unless the authorization is terminated  or revoked sooner.       Influenza A by PCR NEGATIVE NEGATIVE Final   Influenza B by PCR NEGATIVE NEGATIVE Final    Comment: (NOTE) The Xpert Xpress SARS-CoV-2/FLU/RSV plus assay is intended as an aid in the diagnosis of influenza from Nasopharyngeal swab specimens and should not be used as a sole basis for treatment. Nasal washings and aspirates are unacceptable for Xpert Xpress  SARS-CoV-2/FLU/RSV testing.  Fact Sheet for Patients: EntrepreneurPulse.com.au  Fact Sheet for Healthcare Providers: IncredibleEmployment.be  This test is not yet approved or cleared by the [place] FDA and has been authorized for detection and/or diagnosis of SARS-CoV-2 by FDA under an Emergency Use Authorization (EUA). This EUA will remain in effect (meaning this test can be used) for the duration of the COVID-19 declaration under Section 564(b)(1) of the Act, 21 U.S.C. section 360bbb-3(b)(1), unless the authorization is terminated or revoked.  Performed at [facility], [place] [address] [place], [place] [address]        Radiology Studies: DG Chest 2 View  Result Date: 05/27/2021 CLINICAL DATA:  Shortness of breath, cough EXAM: CHEST - 2 VIEW COMPARISON:  04/07/2019 FINDINGS: Eventration of the right hemidiaphragm. Small right and trace left pleural effusions. Associated bibasilar atelectasis. No frank interstitial edema.  Mild cardiomegaly.  No pneumothorax. IMPRESSION: Mild cardiomegaly with small right and trace left pleural effusions. Associated bibasilar atelectasis. No frank interstitial edema. Electronically Signed   By: Julian Hy M.D.   On: 05/27/2021 14:30   ECHOCARDIOGRAM COMPLETE  Result Date: 05/28/2021    ECHOCARDIOGRAM REPORT   Patient Name:   LONDYN WOTTON Date of Exam: 05/28/2021 Medical Rec #:  [id number]       Height:       66.0 in Accession #:    [id number]      Weight:       226.6 lb Date of Birth:  10/06/1945      BSA:          2.109 m Patient Age:    9 years        BP:           139/81 mmHg Patient Gender: M               HR:           71 bpm. Exam Location:  Inpatient Procedure: 2D Echo, Cardiac Doppler and Color Doppler Indications:    CHF  History:        Patient has prior history of Echocardiogram examinations, most                 recent 01/30/2018. CAD and Previous Myocardial Infarction,  COPD,                 Aortic Valve Disease; Risk Factors:Former Smoker, Hypertension                 and Dyslipidemia. Hx stroke.  Sonographer:    Clayton Lefort RDCS (AE) Referring Phys: Laurie  1. Left ventricular ejection fraction, by estimation, is approximately 55%. The left ventricle has normal function. Left ventricular endocardial border not  optimally defined to evaluate regional wall motion. There is severe concentric left ventricular hypertrophy. Left ventricular diastolic parameters are consistent with Grade I diastolic dysfunction (impaired relaxation). Elevated left ventricular end-diastolic pressure.  2. Right ventricular systolic function is normal. The right ventricular size is normal. There is mildly elevated pulmonary artery systolic pressure. The estimated right ventricular systolic pressure is 96.7 mmHg.  3. Left atrial size was mildly dilated.  4. Right atrial size was mildly dilated.  5. There is trivial pericardial effusion surrounding the apex.  6. The mitral valve is degenerative. Moderate mitral valve regurgitation and very eccentric. The mean mitral valve gradient is 3.0 mmHg. Moderate mitral annular calcification.  7. Tricuspid valve regurgitation is moderate.  8. The aortic valve is tricuspid. There is severe calcifcation of the aortic valve. Aortic valve regurgitation is not visualized. Severe aortic valve stenosis. Aortic valve mean gradient measures 56.5 mmHg. Dimentionless index 0.19.  9. The inferior vena cava is dilated in size with <50% respiratory variability, suggesting right atrial pressure of 15 mmHg. Comparison(s): Prior images reviewed side by side. Aortic stenosis has progressed and in severe range. Mitral regurgitation is very eccentric and likely moderate. FINDINGS  Left Ventricle: Left ventricular ejection fraction, by estimation, is 55%. The left ventricle has normal function. Left ventricular endocardial border not optimally defined to evaluate regional  wall motion. The left ventricular internal cavity size was normal in size. There is severe concentric left ventricular hypertrophy. Left ventricular diastolic parameters are consistent with Grade I diastolic dysfunction (impaired relaxation). Elevated left ventricular end-diastolic pressure. Right Ventricle: The right ventricular size is normal. No increase in right ventricular wall thickness. Right ventricular systolic function is normal. There is mildly elevated pulmonary artery systolic pressure. The tricuspid regurgitant velocity is 2.37  m/s, and with an assumed right atrial pressure of 15 mmHg, the estimated right ventricular systolic pressure is 59.1 mmHg. Left Atrium: Left atrial size was mildly dilated. Right Atrium: Right atrial size was mildly dilated. Pericardium: Trivial pericardial effusion is present. The pericardial effusion is surrounding the apex. Mitral Valve: The mitral valve is degenerative in appearance. There is mild thickening of the mitral valve leaflet(s). There is mild calcification of the mitral valve leaflet(s). Moderate mitral annular calcification. Moderate mitral valve regurgitation.  MV peak gradient, 4.9 mmHg. The mean mitral valve gradient is 3.0 mmHg. Tricuspid Valve: The tricuspid valve is grossly normal. Tricuspid valve regurgitation is moderate. Aortic Valve: The aortic valve is tricuspid. There is severe calcifcation of the aortic valve. Aortic valve regurgitation is not visualized. Severe aortic stenosis is present. Aortic valve mean gradient measures 56.5 mmHg. Aortic valve peak gradient measures 93.5 mmHg. Aortic valve area, by VTI measures 0.61 cm. Pulmonic Valve: The pulmonic valve was grossly normal. Pulmonic valve regurgitation is trivial. Aorta: The aortic root is normal in size and structure. Venous: The inferior vena cava is dilated in size with less than 50% respiratory variability, suggesting right atrial pressure of 15 mmHg. IAS/Shunts: No atrial level shunt  detected by color flow Doppler.  LEFT VENTRICLE PLAX 2D LVIDd:         4.10 cm   Diastology LVIDs:         2.80 cm   LV e' medial:    4.35 cm/s LV PW:         2.50 cm   LV E/e' medial:  18.2 LV IVS:        2.00 cm   LV e' lateral:   6.85 cm/s LVOT diam:  2.00 cm   LV E/e' lateral: 11.5 LV SV:         67 LV SV Index:   32 LVOT Area:     3.14 cm  RIGHT VENTRICLE             IVC RV Basal diam:  4.40 cm     IVC diam: 2.80 cm RV Mid diam:    3.40 cm RV S prime:     17.80 cm/s TAPSE (M-mode): 2.6 cm LEFT ATRIUM             Index        RIGHT ATRIUM           Index LA diam:        3.90 cm 1.85 cm/m   RA Area:     23.90 cm LA Vol (A2C):   82.8 ml 39.26 ml/m  RA Volume:   79.30 ml  37.60 ml/m LA Vol (A4C):   76.5 ml 36.27 ml/m LA Biplane Vol: 82.5 ml 39.12 ml/m  AORTIC VALVE AV Area (Vmax):    0.59 cm AV Area (Vmean):   0.58 cm AV Area (VTI):     0.61 cm AV Vmax:           483.50 cm/s AV Vmean:          352.000 cm/s AV VTI:            1.105 m AV Peak Grad:      93.5 mmHg AV Mean Grad:      56.5 mmHg LVOT Vmax:         90.10 cm/s LVOT Vmean:        65.500 cm/s LVOT VTI:          0.213 m LVOT/AV VTI ratio: 0.19  AORTA Ao Root diam: 3.50 cm Ao Asc diam:  3.10 cm MITRAL VALVE                  TRICUSPID VALVE MV Area (PHT): 2.42 cm       TR Peak grad:   22.5 mmHg MV Area VTI:   2.14 cm       TR Vmax:        237.00 cm/s MV Peak grad:  4.9 mmHg MV Mean grad:  3.0 mmHg       SHUNTS MV Vmax:       1.11 m/s       Systemic VTI:  0.21 m MV Vmean:      76.2 cm/s      Systemic Diam: 2.00 cm MV Decel Time: 313 msec MR Peak grad:    64.0 mmHg MR Mean grad:    39.0 mmHg MR Vmax:         400.00 cm/s MR Vmean:        290.0 cm/s MR PISA:         1.57 cm MR PISA Eff ROA: 12 mm MR PISA Radius:  0.50 cm MV E velocity: 79.10 cm/s MV A velocity: 101.00 cm/s MV E/A ratio:  0.78 Rozann Lesches MD Electronically signed by Rozann Lesches MD Signature Date/Time: 05/28/2021/2:45:46 PM    Final     Scheduled Meds:  budesonide  (PULMICORT) nebulizer solution  0.25 mg Nebulization BID   enoxaparin (LOVENOX) injection  40 mg Subcutaneous QHS   feeding supplement  237 mL Oral BID BM   finasteride  5 mg Oral Daily   furosemide  40 mg Intravenous BID   gabapentin  300 mg Oral  BID   insulin aspart  0-5 Units Subcutaneous QHS   insulin aspart  0-9 Units Subcutaneous TID WC   ipratropium-albuterol  3 mL Nebulization Q6H   mirabegron ER  25 mg Oral Daily   predniSONE  50 mg Oral Q breakfast   tamsulosin  0.4 mg Oral QPC supper   Continuous Infusions:   LOS: 2 days   Time spent: 30 minutes   Darliss Cheney, MD [facility]  05/29/2021, 1:59 PM  Please page via [place] and do not message via secure chat for anything urgent. Secure chat can be used for anything non urgent.  How to contact the [facility] Attending or Consulting provider [place] or covering provider during after hours [place], for this patient?  Check the care team in [facility] and look for a) attending/consulting [facility] provider listed and b) the [facility] team listed. Page or secure chat [phone]. Log into [email or web address] and use [place]'s universal password to access. If you do not have the password, please contact the hospital operator. Locate the [facility] provider you are looking for under [facility] and page to a number that you can be directly reached. If you still have difficulty reaching the provider, please page the [facility] (Director on Call) for the Hospitalists listed on amion for assistance.

## 2021-05-29 NOTE — Progress Notes (Signed)
CRITICAL VALUE STICKER  CRITICAL VALUE: PH=7.185, CO2>120, PO2<31  RECEIVER (on-site recipient of call):Gola Bribiesca RN  DATE & TIME NOTIFIED: 05/28/21 2227  MESSENGER (representative from lab):Purcell Nails  MD NOTIFIED: Dr. Jeannene Patella  TIME OF NOTIFICATION:2245  RESPONSE:  No new orders given at this time

## 2021-05-29 NOTE — Plan of Care (Signed)
Patient seen and evaluated at 0200  Awake on NIPPV watching football replays. AAOx3. Comfortable appearing.  Ipap 20, epap 6, TV ranging 490-750. Uncompensated hyeprcarbia improving. Has diuresed well with almost 2L uop and air movement is better on exam.  Continue care as outlined in consult note. Follow up blood gas in AM.

## 2021-05-29 NOTE — Progress Notes (Signed)
CRITICAL VALUE STICKER  CRITICAL VALUE: PCO2= 115, PO2=28.9  RECEIVER (on-site recipient of call):Venson Ferencz [place] NOTIFIED: 05/29/21 0033  MESSENGER (representative from lab):Purcell Nails  MD NOTIFIED: Jeannene Patella  TIME OF NOTIFICATION:0039  RESPONSE: No new orders given at this time

## 2021-05-29 NOTE — Progress Notes (Signed)
°   05/29/21 0228  BiPAP/CPAP/SIPAP  $ Non-Invasive Ventilator  Non-Invasive Vent Subsequent  BiPAP/CPAP/SIPAP Pt Type Adult  Mask Type Full face mask  Mask Size Large  Set Rate 18 breaths/min  Respiratory Rate 20 breaths/min  IPAP 20 cmH20  EPAP 6 cmH2O  Oxygen Percent 50 %  Minute Ventilation 8.7  Leak 32  Peak Inspiratory Pressure (PIP) 20  Tidal Volume (Vt) 589  BiPAP/CPAP/SIPAP BiPAP  Patient Home Equipment No  Auto Titrate No  Press High Alarm 25 cmH2O  Press Low Alarm 5 cmH2O  BiPAP/CPAP /SiPAP Vitals  Pulse Rate 67  Resp 20  SpO2 100 %  MEWS Score/Color  MEWS Score 0  MEWS Score Color Green   Pt. Is more alert and oriented pt. Tolerating above settings.

## 2021-05-30 DIAGNOSIS — J9601 Acute respiratory failure with hypoxia: Secondary | ICD-10-CM | POA: Diagnosis not present

## 2021-05-30 DIAGNOSIS — I509 Heart failure, unspecified: Secondary | ICD-10-CM | POA: Diagnosis not present

## 2021-05-30 DIAGNOSIS — I350 Nonrheumatic aortic (valve) stenosis: Secondary | ICD-10-CM | POA: Diagnosis not present

## 2021-05-30 DIAGNOSIS — Z720 Tobacco use: Secondary | ICD-10-CM

## 2021-05-30 DIAGNOSIS — E6609 Other obesity due to excess calories: Secondary | ICD-10-CM | POA: Diagnosis not present

## 2021-05-30 DIAGNOSIS — J9602 Acute respiratory failure with hypercapnia: Secondary | ICD-10-CM | POA: Diagnosis not present

## 2021-05-30 DIAGNOSIS — I5033 Acute on chronic diastolic (congestive) heart failure: Secondary | ICD-10-CM | POA: Diagnosis not present

## 2021-05-30 LAB — BASIC METABOLIC PANEL
Anion gap: 6 (ref 5–15)
BUN: 22 mg/dL (ref 8–23)
CO2: 44 mmol/L — ABNORMAL HIGH (ref 22–32)
Calcium: 8.3 mg/dL — ABNORMAL LOW (ref 8.9–10.3)
Chloride: 91 mmol/L — ABNORMAL LOW (ref 98–111)
Creatinine, Ser: 0.73 mg/dL (ref 0.61–1.24)
GFR, Estimated: >60 mL/min (ref >60)
Glucose, Bld: 90 mg/dL (ref 70–99)
Potassium: 4.3 mmol/L (ref 3.5–5.1)
Sodium: 141 mmol/L (ref 135–145)

## 2021-05-30 LAB — BLOOD GAS, ARTERIAL
Acid-Base Excess: 21.5 mmol/L — ABNORMAL HIGH (ref 0.0–2.0)
Bicarbonate: 48 mmol/L — ABNORMAL HIGH (ref 20.0–28.0)
FIO2: 40
O2 Saturation: 88.5 %
Patient temperature: 37
pCO2 arterial: 83.3 mmHg (ref 32.0–48.0)
pH, Arterial: 7.379 (ref 7.350–7.450)
pO2, Arterial: 56.7 mmHg — ABNORMAL LOW (ref 83.0–108.0)

## 2021-05-30 LAB — GLUCOSE, CAPILLARY
Glucose-Capillary: 100 mg/dL — ABNORMAL HIGH (ref 70–99)
Glucose-Capillary: 161 mg/dL — ABNORMAL HIGH (ref 70–99)
Glucose-Capillary: 196 mg/dL — ABNORMAL HIGH (ref 70–99)
Glucose-Capillary: 156 mg/dL — ABNORMAL HIGH (ref 70–99)

## 2021-05-30 LAB — MAGNESIUM: Magnesium: 2.2 mg/dL (ref 1.7–2.4)

## 2021-05-30 MED ORDER — ASPIRIN EC 81 MG PO TBEC
81.0000 mg | DELAYED_RELEASE_TABLET | Freq: Every day | ORAL | Status: DC
  Administered 2021-05-30 – 2021-06-10 (×11): 81 mg via ORAL
  Filled 2021-05-30 (×11): qty 1

## 2021-05-30 MED ORDER — ASPIRIN 81 MG PO CHEW
81.0000 mg | CHEWABLE_TABLET | ORAL | Status: AC
  Administered 2021-05-31: 06:00:00 81 mg via ORAL
  Filled 2021-05-30: qty 1

## 2021-05-30 NOTE — Progress Notes (Addendum)
Progress Note  Patient Name: Kyle Lewis Date of Encounter: 05/31/2021  [facility] Cardiologist: Jenean Lindau, MD   Subjective  Comfortable. States breathing is improving. No chest pain.  Net negative 2.3L [place] stable 0.84 Remains on 5L [place]   Inpatient Medications    Scheduled Meds:  aspirin EC  81 mg Oral Daily   budesonide (PULMICORT) nebulizer solution  0.25 mg Nebulization BID   enoxaparin (LOVENOX) injection  40 mg Subcutaneous QHS   feeding supplement  237 mL Oral BID BM   finasteride  5 mg Oral Daily   furosemide  40 mg Intravenous BID   gabapentin  300 mg Oral BID   insulin aspart  0-5 Units Subcutaneous QHS   insulin aspart  0-9 Units Subcutaneous TID WC   ipratropium-albuterol  3 mL Nebulization Q6H   mirabegron ER  25 mg Oral Daily   sodium chloride flush  3 mL Intravenous Q12H   tamsulosin  0.4 mg Oral QPC supper   Continuous Infusions:  sodium chloride     sodium chloride 100 mL/hr at 05/31/21 0619   PRN Meds: sodium chloride, acetaminophen **OR** acetaminophen, ondansetron **OR** ondansetron (ZOFRAN) IV, sodium chloride flush   Vital Signs    Vitals:   05/31/21 0446 05/31/21 0700 05/31/21 0808 05/31/21 0900  BP: 128/72 (!) 141/69  (!) 148/81  Pulse: 63 66  73  Resp:      Temp: 98 F (36.7 C)     TempSrc:      SpO2: 91% 94% 94% 94%  Weight: 99.3 kg     Height:        Intake/Output Summary (Last 24 hours) at 05/31/2021 1058 Last data filed at 05/31/2021 0700 Gross per 24 hour  Intake 1800 ml  Output 3075 ml  Net -1275 ml   Last 3 Weights 05/31/2021 05/30/2021 05/29/2021  Weight (lbs) 218 lb 14.7 oz 219 lb 9.3 oz 222 lb 3.6 oz  Weight (kg) 99.3 kg 99.6 kg 100.8 kg      Telemetry    NSR- Personally Reviewed  ECG    No new ECG today- Personally Reviewed  Physical Exam   GEN: Comfortable, on 5L [place] Neck: JVD difficult to assess given habitus Cardiac: RRR, 3/6 systolic murmur Respiratory: Expiratory wheezing  throughout GI: Obese, soft, ND MS: No edema; No deformity. Neuro:  Nonfocal  Psych: Normal affect   Labs    High Sensitivity Troponin:   Recent Labs  Lab 05/27/21 1420 05/27/21 1904  TROPONINIHS 58* 54*     Chemistry Recent Labs  Lab 05/27/21 1420 05/28/21 0026 05/29/21 0015 05/30/21 0705 05/31/21 0419  NA 144 139 143 141 139  K 4.1 4.1 4.6 4.3 4.8  CL 104 97* 96* 91* 87*  CO2 34* 36* 42* 44* 45*  GLUCOSE 123* 210* 127* 90 99  BUN 23 18 22 22  32*  CREATININE 0.74 0.81   0.82 1.16 0.73 0.84  CALCIUM 8.4* 7.9* 8.2* 8.3* 8.5*  MG  --  2.0 2.3 2.2  --   PROT 6.2* 5.5*  --   --   --   ALBUMIN 3.7 3.3*  --   --   --   AST 19 22  --   --   --   ALT 31 29  --   --   --   ALKPHOS 56 48  --   --   --   BILITOT 0.8 1.1  --   --   --  GFRNONAA >60 >60   >60 >60 >60 >60  ANIONGAP 6 6 5 6 7     Lipids  Recent Labs  Lab 05/31/21 0419  CHOL 159  TRIG 64  HDL 58  LDLCALC 88  CHOLHDL 2.7    Hematology Recent Labs  Lab 05/27/21 1420 05/28/21 0026  WBC 7.7 5.8  RBC 5.20 4.94  HGB 16.6 15.7  HCT 52.8* 48.4  MCV 101.5* 98.0  MCH 31.9 31.8  MCHC 31.4 32.4  RDW 14.5 14.2  PLT 186 167   Thyroid No results for input(s): TSH, FREET4 in the last 168 hours.  BNP Recent Labs  Lab 05/27/21 1420  BNP 1,348.7*    DDimer No results for input(s): DDIMER in the last 168 hours.   Radiology    No results found.  Cardiac Studies   1. Left ventricular ejection fraction, by estimation, is approximately  55%. The left ventricle has normal function. Left ventricular endocardial  border not optimally defined to evaluate regional wall motion. There is  severe concentric left ventricular  hypertrophy. Left ventricular diastolic parameters are consistent with  Grade I diastolic dysfunction (impaired relaxation). Elevated left  ventricular end-diastolic pressure.   2. Right ventricular systolic function is normal. The right ventricular  size is normal. There is mildly  elevated pulmonary artery systolic  pressure. The estimated right ventricular systolic pressure is 30.0 mmHg.   3. Left atrial size was mildly dilated.   4. Right atrial size was mildly dilated.   5. There is trivial pericardial effusion surrounding the apex.   6. The mitral valve is degenerative. Moderate mitral valve regurgitation  and very eccentric. The mean mitral valve gradient is 3.0 mmHg. Moderate  mitral annular calcification.   7. Tricuspid valve regurgitation is moderate.   8. The aortic valve is tricuspid. There is severe calcifcation of the  aortic valve. Aortic valve regurgitation is not visualized. Severe aortic  valve stenosis. Aortic valve mean gradient measures 56.5 mmHg.  Dimentionless index 0.19.   9. The inferior vena cava is dilated in size with <50% respiratory  variability, suggesting right atrial pressure of 15 mmHg.   Patient Profile     75 y.o. male with a hx of CAD rx medically, AAA s/p repair, CKD III, AS, carotid disease, OSA on CPAP, CVA, COPD, HTN who presented with worsening SOB and hypoxia found to have severe AS on TTE and elevated BNP for which Cardiology was consulted.   Assessment & Plan    #Acute respiratory failure with hypoxia and hypercapnia: Likely multifactorial with diastolic heart failure due to severe aortic stenosis and COPD and OHS/OSA.  Developed confusion on 12/24 and ABG showed profound respiratory acidosis (PCO2 greater than 120, pH 7.157).  PCCM consulted, he improved on BiPAP and now weaned to 5L [place]. -Continue nebs -Completed course of steroids -Diuresis as below  #Severe aortic stenosis:  Echo 05/28/2021 showed severe aortic stenosis (V-max 4.9 m/s, mean gradient 59 mmHg, AVA 0.6 cm, DI 0.18).   -Plan for RHC/LHC today -Will need structural evaluation -Judicious diuresis given severe AS; continue for now  #Acute on chronic diastolic CHF:  Presented with volume overload in setting of severe aortic stenosis as above.  -Continue  lasix 40mg  IV BID; reassess filling pressures on cath today -Will add spiro/SGLT2i as able post-cath -Monitor I/Os and daily weights  #Mitral regurgitation:  Very eccentric jet (Coanda effect), and not well visualized on TTE.  Low E wave velocity with E less than A argues against severe  MR.  Suspect likely moderate MR and can monitor for improvement following treatment of his severe AS. -Management of severe AS as above -Diuresis as above -Will need repeat TTE as out-patient for monitoring  #CAD:  Cath in 2019 showed moderate to severe disease involving distal RCA and OM 2, relatively small vessels and medical management was recommended.  Will follow-up LHC as above.  -Plan for LHC/RHC as above -Continue ASA 81mg  daily -Start crestor 20mg  daily  INFORMED CONSENT: I have reviewed the risks, indications, and alternatives to cardiac catheterization, possible angioplasty, and stenting with the patient. Risks include but are not limited to bleeding, infection, vascular injury, stroke, myocardial infection, arrhythmia, kidney injury, radiation-related injury in the case of prolonged fluoroscopy use, emergency cardiac surgery, and death. The patient understands the risks of serious complication is 1-2 in 6967 with diagnostic cardiac cath and 1-2% or less with angioplasty/stenting.     For questions or updates, please contact [place] Please consult [email or web address] for contact info under        Signed, Freada Bergeron, MD  05/31/2021, 10:58 AM

## 2021-05-30 NOTE — Progress Notes (Signed)
Progress Note  Patient Name: Kyle Lewis Date of Encounter: 05/30/2021  [facility] Cardiologist: Jenean Lindau, MD   Subjective   Net -2.1 L yesterday, -3.9 L on admission.  BP 169/79 this morning.  Weight down 2 pounds from yesterday.  He is off BiPAP, now on 5 L [place].  Creatinine stable at 0.7.  He reports his dyspnea has improved    Inpatient Medications    Scheduled Meds:  budesonide (PULMICORT) nebulizer solution  0.25 mg Nebulization BID   enoxaparin (LOVENOX) injection  40 mg Subcutaneous QHS   feeding supplement  237 mL Oral BID BM   finasteride  5 mg Oral Daily   furosemide  40 mg Intravenous BID   gabapentin  300 mg Oral BID   insulin aspart  0-5 Units Subcutaneous QHS   insulin aspart  0-9 Units Subcutaneous TID WC   ipratropium-albuterol  3 mL Nebulization Q6H   mirabegron ER  25 mg Oral Daily   predniSONE  50 mg Oral Q breakfast   tamsulosin  0.4 mg Oral QPC supper   Continuous Infusions:  PRN Meds: acetaminophen **OR** acetaminophen, ondansetron **OR** ondansetron (ZOFRAN) IV   Vital Signs    Vitals:   05/30/21 0150 05/30/21 0500 05/30/21 0731 05/30/21 0756  BP:   (!) 169/79   Pulse: 68  74   Resp:  18 17   Temp:  98.7 F (37.1 C) 98.9 F (37.2 C)   TempSrc:  Oral Oral   SpO2: 95%  92% 90%  Weight:  99.6 kg    Height:        Intake/Output Summary (Last 24 hours) at 05/30/2021 0941 Last data filed at 05/30/2021 0810 Gross per 24 hour  Intake 716 ml  Output 2550 ml  Net -1834 ml    Last 3 Weights 05/30/2021 05/29/2021 05/28/2021  Weight (lbs) 219 lb 9.3 oz 222 lb 3.6 oz 226 lb 10.1 oz  Weight (kg) 99.6 kg 100.8 kg 102.8 kg      Telemetry    Normal sinus rhythm with rate 60s to 70s- Personally Reviewed  ECG    No new ECG- Personally Reviewed  Physical Exam   GEN: On Bipap, appears comfortable Neck: JVD difficult to assess given habitus Cardiac: RRR, 3/6 systolic murmur Respiratory: Expiratory wheezing GI: Soft,  nontender MS: No edema; No deformity. Neuro:  Nonfocal  Psych: Normal affect   Labs    High Sensitivity Troponin:   Recent Labs  Lab 05/27/21 1420 05/27/21 1904  TROPONINIHS 58* 54*      Chemistry Recent Labs  Lab 05/27/21 1420 05/28/21 0026 05/29/21 0015 05/30/21 0705  NA 144 139 143 141  K 4.1 4.1 4.6 4.3  CL 104 97* 96* 91*  CO2 34* 36* 42* 44*  GLUCOSE 123* 210* 127* 90  BUN 23 18 22 22   CREATININE 0.74 0.81   0.82 1.16 0.73  CALCIUM 8.4* 7.9* 8.2* 8.3*  MG  --  2.0 2.3 2.2  PROT 6.2* 5.5*  --   --   ALBUMIN 3.7 3.3*  --   --   AST 19 22  --   --   ALT 31 29  --   --   ALKPHOS 56 48  --   --   BILITOT 0.8 1.1  --   --   GFRNONAA >60 >60   >60 >60 >60  ANIONGAP 6 6 5 6      Lipids No results for input(s): CHOL, TRIG, HDL, LABVLDL, LDLCALC,  CHOLHDL in the last 168 hours.  Hematology Recent Labs  Lab 05/27/21 1420 05/28/21 0026  WBC 7.7 5.8  RBC 5.20 4.94  HGB 16.6 15.7  HCT 52.8* 48.4  MCV 101.5* 98.0  MCH 31.9 31.8  MCHC 31.4 32.4  RDW 14.5 14.2  PLT 186 167    Thyroid No results for input(s): TSH, FREET4 in the last 168 hours.  BNP Recent Labs  Lab 05/27/21 1420  BNP 1,348.7*     DDimer No results for input(s): DDIMER in the last 168 hours.   Radiology    ECHOCARDIOGRAM COMPLETE  Result Date: 05/28/2021    ECHOCARDIOGRAM REPORT   Patient Name:   Kyle Lewis Date of Exam: 05/28/2021 Medical Rec #:  [id number]       Height:       66.0 in Accession #:    [id number]      Weight:       226.6 lb Date of Birth:  [birth year]      BSA:          2.109 m Patient Age:    58 years        BP:           139/81 mmHg Patient Gender: M               HR:           71 bpm. Exam Location:  Inpatient Procedure: 2D Echo, Cardiac Doppler and Color Doppler Indications:    CHF  History:        Patient has prior history of Echocardiogram examinations, most                 recent 01/30/2018. CAD and Previous Myocardial Infarction, COPD,                 Aortic Valve  Disease; Risk Factors:Former Smoker, Hypertension                 and Dyslipidemia. Hx stroke.  Sonographer:    Clayton Lefort RDCS (AE) Referring Phys: [place]  1. Left ventricular ejection fraction, by estimation, is approximately 55%. The left ventricle has normal function. Left ventricular endocardial border not optimally defined to evaluate regional wall motion. There is severe concentric left ventricular hypertrophy. Left ventricular diastolic parameters are consistent with Grade I diastolic dysfunction (impaired relaxation). Elevated left ventricular end-diastolic pressure.  2. Right ventricular systolic function is normal. The right ventricular size is normal. There is mildly elevated pulmonary artery systolic pressure. The estimated right ventricular systolic pressure is 18.2 mmHg.  3. Left atrial size was mildly dilated.  4. Right atrial size was mildly dilated.  5. There is trivial pericardial effusion surrounding the apex.  6. The mitral valve is degenerative. Moderate mitral valve regurgitation and very eccentric. The mean mitral valve gradient is 3.0 mmHg. Moderate mitral annular calcification.  7. Tricuspid valve regurgitation is moderate.  8. The aortic valve is tricuspid. There is severe calcifcation of the aortic valve. Aortic valve regurgitation is not visualized. Severe aortic valve stenosis. Aortic valve mean gradient measures 56.5 mmHg. Dimentionless index 0.19.  9. The inferior vena cava is dilated in size with <50% respiratory variability, suggesting right atrial pressure of 15 mmHg. Comparison(s): Prior images reviewed side by side. Aortic stenosis has progressed and in severe range. Mitral regurgitation is very eccentric and likely moderate. FINDINGS  Left Ventricle: Left ventricular ejection fraction, by estimation, is 55%. The left ventricle has normal function.  Left ventricular endocardial border not optimally defined to evaluate regional wall motion. The left ventricular  internal cavity size was normal in size. There is severe concentric left ventricular hypertrophy. Left ventricular diastolic parameters are consistent with Grade I diastolic dysfunction (impaired relaxation). Elevated left ventricular end-diastolic pressure. Right Ventricle: The right ventricular size is normal. No increase in right ventricular wall thickness. Right ventricular systolic function is normal. There is mildly elevated pulmonary artery systolic pressure. The tricuspid regurgitant velocity is 2.37  m/s, and with an assumed right atrial pressure of 15 mmHg, the estimated right ventricular systolic pressure is 79.3 mmHg. Left Atrium: Left atrial size was mildly dilated. Right Atrium: Right atrial size was mildly dilated. Pericardium: Trivial pericardial effusion is present. The pericardial effusion is surrounding the apex. Mitral Valve: The mitral valve is degenerative in appearance. There is mild thickening of the mitral valve leaflet(s). There is mild calcification of the mitral valve leaflet(s). Moderate mitral annular calcification. Moderate mitral valve regurgitation.  MV peak gradient, 4.9 mmHg. The mean mitral valve gradient is 3.0 mmHg. Tricuspid Valve: The tricuspid valve is grossly normal. Tricuspid valve regurgitation is moderate. Aortic Valve: The aortic valve is tricuspid. There is severe calcifcation of the aortic valve. Aortic valve regurgitation is not visualized. Severe aortic stenosis is present. Aortic valve mean gradient measures 56.5 mmHg. Aortic valve peak gradient measures 93.5 mmHg. Aortic valve area, by VTI measures 0.61 cm. Pulmonic Valve: The pulmonic valve was grossly normal. Pulmonic valve regurgitation is trivial. Aorta: The aortic root is normal in size and structure. Venous: The inferior vena cava is dilated in size with less than 50% respiratory variability, suggesting right atrial pressure of 15 mmHg. IAS/Shunts: No atrial level shunt detected by color flow Doppler.  LEFT  VENTRICLE PLAX 2D LVIDd:         4.10 cm   Diastology LVIDs:         2.80 cm   LV e' medial:    4.35 cm/s LV PW:         2.50 cm   LV E/e' medial:  18.2 LV IVS:        2.00 cm   LV e' lateral:   6.85 cm/s LVOT diam:     2.00 cm   LV E/e' lateral: 11.5 LV SV:         67 LV SV Index:   32 LVOT Area:     3.14 cm  RIGHT VENTRICLE             IVC RV Basal diam:  4.40 cm     IVC diam: 2.80 cm RV Mid diam:    3.40 cm RV S prime:     17.80 cm/s TAPSE (M-mode): 2.6 cm LEFT ATRIUM             Index        RIGHT ATRIUM           Index LA diam:        3.90 cm 1.85 cm/m   RA Area:     23.90 cm LA Vol (A2C):   82.8 ml 39.26 ml/m  RA Volume:   79.30 ml  37.60 ml/m LA Vol (A4C):   76.5 ml 36.27 ml/m LA Biplane Vol: 82.5 ml 39.12 ml/m  AORTIC VALVE AV Area (Vmax):    0.59 cm AV Area (Vmean):   0.58 cm AV Area (VTI):     0.61 cm AV Vmax:  483.50 cm/s AV Vmean:          352.000 cm/s AV VTI:            1.105 m AV Peak Grad:      93.5 mmHg AV Mean Grad:      56.5 mmHg LVOT Vmax:         90.10 cm/s LVOT Vmean:        65.500 cm/s LVOT VTI:          0.213 m LVOT/AV VTI ratio: 0.19  AORTA Ao Root diam: 3.50 cm Ao Asc diam:  3.10 cm MITRAL VALVE                  TRICUSPID VALVE MV Area (PHT): 2.42 cm       TR Peak grad:   22.5 mmHg MV Area VTI:   2.14 cm       TR Vmax:        237.00 cm/s MV Peak grad:  4.9 mmHg MV Mean grad:  3.0 mmHg       SHUNTS MV Vmax:       1.11 m/s       Systemic VTI:  0.21 m MV Vmean:      76.2 cm/s      Systemic Diam: 2.00 cm MV Decel Time: 313 msec MR Peak grad:    64.0 mmHg MR Mean grad:    39.0 mmHg MR Vmax:         400.00 cm/s MR Vmean:        290.0 cm/s MR PISA:         1.57 cm MR PISA Eff ROA: 12 mm MR PISA Radius:  0.50 cm MV E velocity: 79.10 cm/s MV A velocity: 101.00 cm/s MV E/A ratio:  0.78 Rozann Lesches MD Electronically signed by Rozann Lesches MD Signature Date/Time: 05/28/2021/2:45:46 PM    Final     Cardiac Studies   1. Left ventricular ejection fraction, by estimation,  is approximately  55%. The left ventricle has normal function. Left ventricular endocardial  border not optimally defined to evaluate regional wall motion. There is  severe concentric left ventricular  hypertrophy. Left ventricular diastolic parameters are consistent with  Grade I diastolic dysfunction (impaired relaxation). Elevated left  ventricular end-diastolic pressure.   2. Right ventricular systolic function is normal. The right ventricular  size is normal. There is mildly elevated pulmonary artery systolic  pressure. The estimated right ventricular systolic pressure is 62.6 mmHg.   3. Left atrial size was mildly dilated.   4. Right atrial size was mildly dilated.   5. There is trivial pericardial effusion surrounding the apex.   6. The mitral valve is degenerative. Moderate mitral valve regurgitation  and very eccentric. The mean mitral valve gradient is 3.0 mmHg. Moderate  mitral annular calcification.   7. Tricuspid valve regurgitation is moderate.   8. The aortic valve is tricuspid. There is severe calcifcation of the  aortic valve. Aortic valve regurgitation is not visualized. Severe aortic  valve stenosis. Aortic valve mean gradient measures 56.5 mmHg.  Dimentionless index 0.19.   9. The inferior vena cava is dilated in size with <50% respiratory  variability, suggesting right atrial pressure of 15 mmHg.   Patient Profile     75 y.o. male with a hx of CAD rx medically, AAA s/p repair, CKD III, AS, carotid dz, OSA on CPAP, CVA, COPD, HTN who is being seen for the evaluation of AS  Assessment & Plan  Acute respiratory failure with hypoxia and hypercapnia: Likely multifactorial with diastolic heart failure due to severe aortic stenosis and COPD and OHS/OSA contributing.  Developed confusion on 12/24 and ABG showed profound respiratory acidosis (PCO2 greater than 120, pH 7.157).  PCCM consulted, he improved on BiPAP, currently off BiPAP and on 5 L [place].  Continue diuresis.  On  steroids, nebulizers per PCCM.  Severe aortic stenosis: Echo 05/28/2021 showed severe aortic stenosis (V-max 4.9 m/s, mean gradient 59 mmHg, AVA 0.6 cm, DI 0.18).   - Continue IV Lasix.  Will be cautious to avoid overdiuresis in setting of severe AS, but would continue IV Lasix for now  -Will plan for RHC/LHC.  Tentatively plan for tomorrow if respiratory status continues to improve.  Plan for structural heart team consult for TAVR evaluation  -Risks and benefits of cardiac catheterization have been discussed with the patient.  I also called and spoke with his wife given patient also with reported mild dementia.  Risks include bleeding, infection, kidney damage, stroke, heart attack, death.  The patient and his wife understand these risks and willing to proceed.   Acute on chronic diastolic CHF: Presented with volume overload in setting of severe aortic stenosis as above - Continue IV Lasix.  Will be cautious to avoid overdiuresis in setting of severe AS, but would continue IV Lasix for now   Mitral regurgitation: Very eccentric jet, and not well visualized on TTE.  Low E wave velocity with E less than A argues against severe MR.  Suspect likely moderate MR and can monitor for improvement following treatment of his severe AS  CAD: Cath in 2019 showed moderate to severe disease involving distal RCA and OM 2, relatively small vessels and medical management was recommended.  Will follow-up LHC as above.  Continue aspirin.  He is not on statin, will check lipid panel and start statin.   For questions or updates, please contact [place] Please consult [email or web address] for contact info under        Signed, Donato Heinz, MD  05/30/2021, 9:41 AM

## 2021-05-30 NOTE — H&P (View-Only) (Signed)
Progress Note  Patient Name: Kyle Lewis Date of Encounter: 05/31/2021  [facility] Cardiologist: Jenean Lindau, MD   Subjective  Comfortable. States breathing is improving. No chest pain.  Net negative 2.3L Cr stable 0.84 Remains on 5L [place]   Inpatient Medications    Scheduled Meds:  aspirin EC  81 mg Oral Daily   budesonide (PULMICORT) nebulizer solution  0.25 mg Nebulization BID   enoxaparin (LOVENOX) injection  40 mg Subcutaneous QHS   feeding supplement  237 mL Oral BID BM   finasteride  5 mg Oral Daily   furosemide  40 mg Intravenous BID   gabapentin  300 mg Oral BID   insulin aspart  0-5 Units Subcutaneous QHS   insulin aspart  0-9 Units Subcutaneous TID WC   ipratropium-albuterol  3 mL Nebulization Q6H   mirabegron ER  25 mg Oral Daily   sodium chloride flush  3 mL Intravenous Q12H   tamsulosin  0.4 mg Oral QPC supper   Continuous Infusions:  sodium chloride     sodium chloride 100 mL/hr at 05/31/21 0619   PRN Meds: sodium chloride, acetaminophen **OR** acetaminophen, ondansetron **OR** ondansetron (ZOFRAN) IV, sodium chloride flush   Vital Signs    Vitals:   05/31/21 0446 05/31/21 0700 05/31/21 0808 05/31/21 0900  BP: 128/72 (!) 141/69  (!) 148/81  Pulse: 63 66  73  Resp:      Temp: 98 F (36.7 C)     TempSrc:      SpO2: 91% 94% 94% 94%  Weight: 99.3 kg     Height:        Intake/Output Summary (Last 24 hours) at 05/31/2021 1058 Last data filed at 05/31/2021 0700 Gross per 24 hour  Intake 1800 ml  Output 3075 ml  Net -1275 ml   Last 3 Weights 05/31/2021 05/30/2021 05/29/2021  Weight (lbs) 218 lb 14.7 oz 219 lb 9.3 oz 222 lb 3.6 oz  Weight (kg) 99.3 kg 99.6 kg 100.8 kg      Telemetry    NSR- Personally Reviewed  ECG    No new ECG today- Personally Reviewed  Physical Exam   GEN: Comfortable, on 5L [place] Neck: JVD difficult to assess given habitus Cardiac: RRR, 3/6 systolic murmur Respiratory: Expiratory wheezing  throughout GI: Obese, soft, ND MS: No edema; No deformity. Neuro:  Nonfocal  Psych: Normal affect   Labs    High Sensitivity Troponin:   Recent Labs  Lab 05/27/21 1420 05/27/21 1904  TROPONINIHS 58* 54*     Chemistry Recent Labs  Lab 05/27/21 1420 05/28/21 0026 05/29/21 0015 05/30/21 0705 05/31/21 0419  NA 144 139 143 141 139  K 4.1 4.1 4.6 4.3 4.8  CL 104 97* 96* 91* 87*  CO2 34* 36* 42* 44* 45*  GLUCOSE 123* 210* 127* 90 99  BUN 23 18 22 22  32*  CREATININE 0.74 0.81   0.82 1.16 0.73 0.84  CALCIUM 8.4* 7.9* 8.2* 8.3* 8.5*  MG  --  2.0 2.3 2.2  --   PROT 6.2* 5.5*  --   --   --   ALBUMIN 3.7 3.3*  --   --   --   AST 19 22  --   --   --   ALT 31 29  --   --   --   ALKPHOS 56 48  --   --   --   BILITOT 0.8 1.1  --   --   --  GFRNONAA >60 >60   >60 >60 >60 >60  ANIONGAP 6 6 5 6 7     Lipids  Recent Labs  Lab 05/31/21 0419  CHOL 159  TRIG 64  HDL 58  LDLCALC 88  CHOLHDL 2.7    Hematology Recent Labs  Lab 05/27/21 1420 05/28/21 0026  WBC 7.7 5.8  RBC 5.20 4.94  HGB 16.6 15.7  HCT 52.8* 48.4  MCV 101.5* 98.0  MCH 31.9 31.8  MCHC 31.4 32.4  RDW 14.5 14.2  PLT 186 167   Thyroid No results for input(s): TSH, FREET4 in the last 168 hours.  BNP Recent Labs  Lab 05/27/21 1420  BNP 1,348.7*    DDimer No results for input(s): DDIMER in the last 168 hours.   Radiology    No results found.  Cardiac Studies   1. Left ventricular ejection fraction, by estimation, is approximately  55%. The left ventricle has normal function. Left ventricular endocardial  border not optimally defined to evaluate regional wall motion. There is  severe concentric left ventricular  hypertrophy. Left ventricular diastolic parameters are consistent with  Grade I diastolic dysfunction (impaired relaxation). Elevated left  ventricular end-diastolic pressure.   2. Right ventricular systolic function is normal. The right ventricular  size is normal. There is mildly  elevated pulmonary artery systolic  pressure. The estimated right ventricular systolic pressure is 62.8 mmHg.   3. Left atrial size was mildly dilated.   4. Right atrial size was mildly dilated.   5. There is trivial pericardial effusion surrounding the apex.   6. The mitral valve is degenerative. Moderate mitral valve regurgitation  and very eccentric. The mean mitral valve gradient is 3.0 mmHg. Moderate  mitral annular calcification.   7. Tricuspid valve regurgitation is moderate.   8. The aortic valve is tricuspid. There is severe calcifcation of the  aortic valve. Aortic valve regurgitation is not visualized. Severe aortic  valve stenosis. Aortic valve mean gradient measures 56.5 mmHg.  Dimentionless index 0.19.   9. The inferior vena cava is dilated in size with <50% respiratory  variability, suggesting right atrial pressure of 15 mmHg.   Patient Profile     75 y.o. male with a hx of CAD rx medically, AAA s/p repair, CKD III, AS, carotid disease, OSA on CPAP, CVA, COPD, HTN who presented with worsening SOB and hypoxia found to have severe AS on TTE and elevated BNP for which Cardiology was consulted.   Assessment & Plan    #Acute respiratory failure with hypoxia and hypercapnia: Likely multifactorial with diastolic heart failure due to severe aortic stenosis and COPD and OHS/OSA.  Developed confusion on 12/24 and ABG showed profound respiratory acidosis (PCO2 greater than 120, pH 7.157).  PCCM consulted, he improved on BiPAP and now weaned to 5L [place]. -Continue nebs -Completed course of steroids -Diuresis as below  #Severe aortic stenosis:  Echo 05/28/2021 showed severe aortic stenosis (V-max 4.9 m/s, mean gradient 59 mmHg, AVA 0.6 cm, DI 0.18).   -Plan for RHC/LHC today -Will need structural evaluation -Judicious diuresis given severe AS; continue for now  #Acute on chronic diastolic CHF:  Presented with volume overload in setting of severe aortic stenosis as above.  -Continue  lasix 40mg  IV BID; reassess filling pressures on cath today -Will add spiro/SGLT2i as able post-cath -Monitor I/Os and daily weights  #Mitral regurgitation:  Very eccentric jet (Coanda effect), and not well visualized on TTE.  Low E wave velocity with E less than A argues against severe  MR.  Suspect likely moderate MR and can monitor for improvement following treatment of his severe AS. -Management of severe AS as above -Diuresis as above -Will need repeat TTE as out-patient for monitoring  #CAD:  Cath in 2019 showed moderate to severe disease involving distal RCA and OM 2, relatively small vessels and medical management was recommended.  Will follow-up LHC as above.  -Plan for LHC/RHC as above -Continue ASA 81mg  daily -Start crestor 20mg  daily  INFORMED CONSENT: I have reviewed the risks, indications, and alternatives to cardiac catheterization, possible angioplasty, and stenting with the patient. Risks include but are not limited to bleeding, infection, vascular injury, stroke, myocardial infection, arrhythmia, kidney injury, radiation-related injury in the case of prolonged fluoroscopy use, emergency cardiac surgery, and death. The patient understands the risks of serious complication is 1-2 in 6803 with diagnostic cardiac cath and 1-2% or less with angioplasty/stenting.     For questions or updates, please contact [place] Please consult [email or web address] for contact info under        Signed, Freada Bergeron, MD  05/31/2021, 10:58 AM

## 2021-05-30 NOTE — Care Management Important Message (Signed)
Important Message  Patient Details  Name: ZAKARIYA KNICKERBOCKER MRN: [id number] Date of Birth: 1946-01-08   Medicare Important Message Given:  Yes     [name] 05/30/2021, 10:20 AM

## 2021-05-30 NOTE — Plan of Care (Signed)
  Problem: Activity: Goal: Capacity to carry out activities will improve Outcome: Progressing   Problem: Cardiac: Goal: Ability to achieve and maintain adequate cardiopulmonary perfusion will improve Outcome: Progressing   

## 2021-05-30 NOTE — TOC Progression Note (Signed)
Transition of Care [facility]) - Progression Note    Patient Details  Name: Kyle Lewis MRN: [id number] Date of Birth: 04/09/1946  Transition of Care [facility]) CM/SW Contact  Zenon Mayo, RN Phone Number: 05/30/2021, 4:14 PM  Clinical Narrative:     Transition of Care [facility]) Screening Note   Patient Details  Name: JOHNTAVIUS SHEPARD Date of Birth: 1946-04-14   Transition of Care [facility]) CM/SW Contact:    Zenon Mayo, RN Phone Number: 05/30/2021, 4:14 PM    Transition of Care Department [facility]) has reviewed patient and no TOC needs have been identified at this time. We will continue to monitor patient advancement through interdisciplinary progression rounds. If new patient transition needs arise, please place a TOC consult.          Expected Discharge Plan and Services                                                 Social Determinants of Health (SDOH) Interventions    Readmission Risk Interventions No flowsheet data found.

## 2021-05-30 NOTE — Progress Notes (Signed)
PROGRESS NOTE    Kyle Lewis  SPQ:[id number] DOB: [birth year] DOA: 05/27/2021 PCP: [name], MD   Brief Narrative:  75 year old white male with a history of COPD, OSA, hypertension, prior history of AAA repair presented to the hospital with worsening shortness of breath.  Patient been treated about 7 to 10 days ago with prednisone, antibiotics for presumed COPD exacerbation.  Patient 's wife called his PCP on the day of admission due to patient having hypoxia at home with reported room air sats of 58%.  911 was activated.On arrival sats were 94% on 4 L.  Blood pressure 175/95.  BNP 1348.  No prior BNP is available.  COVID and flu negative.   Patient was admitted to hospital service due to acute hypoxic respiratory failure secondary to acute on chronic diastolic congestive heart failure likely secondary to severe aortic stenosis.   Assessment & Plan:   Principal Problem:   Acute CHF (congestive heart failure) (HCC) Active Problems:   OSA (obstructive sleep apnea)   Exogenous obesity   Tobacco use   Severe aortic stenosis   Acute respiratory failure with hypoxia and hypercapnia (HCC)   Acute on chronic diastolic heart failure (HCC)  Acute respiratory failure with hypoxia secondary to acute on chronic diastolic congestive heart failure moderate to severe aortic stenosis/respiratory acidosis: Previous echo done in August 2019 shows normal ejection fraction but grade 1 diastolic dysfunction.  Cardiology is on board.Repeat echo shows severe aortic stenosis.  He will likely need TAVR work-up with structural cardiac team which is not possible until Tuesday due to holidays.  Patient is now off of BiPAP and he is on 5 L oxygen, feels well.  However ABGs this morning once again shows that his CO2 remains elevated around 82.  He has put out another 2 L and he is net -4 L now.  Cardiology managing, he is on IV Lasix and the plan is for left heart cath tomorrow morning.  Mild acute COPD  exacerbation: Wheezing has improved.  Now off BiPAP, on 5 L oxygen.  Completed prednisone and antibiotic.  Continue bronchodilators.   OSA (obstructive sleep apnea) Chronic.  Continue home BiPAP.   Morbid obesity: Weight loss counseling provided.   Tobacco abuse: Patient advised to stop smoking.  Prediabetes: Hemoglobin A1c 5.8.  Continue SSI.  BPH: Continue Flomax.  DVT prophylaxis: enoxaparin (LOVENOX) injection 40 mg Start: 05/27/21 2315 SCDs Start: 05/27/21 2310   Code Status: Full Code  Family Communication:  None present at bedside.  His wife was updated by cardiology today.  Status is: Inpatient  Remains inpatient appropriate because: Needs work-up for TAVR.  Estimated body mass index is 35.44 kg/m as calculated from the following:   Height as of this encounter: 5\' 6"  (1.676 m).   Weight as of this encounter: 99.6 kg.   Nutritional Assessment: Body mass index is 35.44 kg/m.Marland Kitchen Seen by dietician.  I agree with the assessment and plan as outlined below: Nutrition Status:   Skin Assessment: I have examined the patient's skin and I agree with the wound assessment as performed by the wound care RN as outlined below:  Consultants:  Cardiology  Procedures:  None  Antimicrobials:  Anti-infectives (From admission, onward)    None          Subjective:  Seen and examined.  Feels well.  No shortness of breath.  Appears to have some signs of dementia.  I highly doubt he has insight about his medical issues and severe aortic  stenosis.  Objective: Vitals:   05/30/21 0500 05/30/21 0731 05/30/21 0756 05/30/21 1104  BP:  (!) 169/79  138/69  Pulse:  74  79  Resp: 18 17  20   Temp: 98.7 F (37.1 C) 98.9 F (37.2 C)  98.1 F (36.7 C)  TempSrc: Oral Oral  Oral  SpO2:  92% 90% 93%  Weight: 99.6 kg     Height:        Intake/Output Summary (Last 24 hours) at 05/30/2021 1331 Last data filed at 05/30/2021 1329 Gross per 24 hour  Intake 1136 ml  Output 3500 ml   Net -2364 ml    Filed Weights   05/28/21 0344 05/29/21 0452 05/30/21 0500  Weight: 102.8 kg 100.8 kg 99.6 kg    Examination:  General exam: Appears calm and comfortable, morbidly obese Respiratory system: Clear to auscultation. Respiratory effort normal. Cardiovascular system: S1 & S2 heard, RRR. No JVD, rubs, gallops or clicks.  Loud systolic murmur.  +1 pitting edema bilateral lower extremity Gastrointestinal system: Abdomen is nondistended, soft and nontender. No organomegaly or masses felt. Normal bowel sounds heard. Central nervous system: Alert and oriented. No focal neurological deficits. Extremities: Symmetric 5 x 5 power. Skin: No rashes, lesions or ulcers.  Psychiatry: Judgement and insight appear poor  Data Reviewed: I have personally reviewed following labs and imaging studies  CBC: Recent Labs  Lab 05/27/21 1420 05/28/21 0026  WBC 7.7 5.8  NEUTROABS 6.1 5.3  HGB 16.6 15.7  HCT 52.8* 48.4  MCV 101.5* 98.0  PLT 186 466    Basic Metabolic Panel: Recent Labs  Lab 05/27/21 1420 05/28/21 0026 05/29/21 0015 05/30/21 0705  NA 144 139 143 141  K 4.1 4.1 4.6 4.3  CL 104 97* 96* 91*  CO2 34* 36* 42* 44*  GLUCOSE 123* 210* 127* 90  BUN 23 18 22 22   CREATININE 0.74 0.81   0.82 1.16 0.73  CALCIUM 8.4* 7.9* 8.2* 8.3*  MG  --  2.0 2.3 2.2    GFR: Estimated Creatinine Clearance: 88.1 mL/min (by C-G formula based on SCr of 0.73 mg/dL). Liver Function Tests: Recent Labs  Lab 05/27/21 1420 05/28/21 0026  AST 19 22  ALT 31 29  ALKPHOS 56 48  BILITOT 0.8 1.1  PROT 6.2* 5.5*  ALBUMIN 3.7 3.3*    No results for input(s): LIPASE, AMYLASE in the last 168 hours. No results for input(s): AMMONIA in the last 168 hours. Coagulation Profile: No results for input(s): INR, PROTIME in the last 168 hours. Cardiac Enzymes: No results for input(s): CKTOTAL, CKMB, CKMBINDEX, TROPONINI in the last 168 hours. BNP (last 3 results) No results for input(s): PROBNP in the  last 8760 hours. HbA1C: Recent Labs    05/28/21 0026  HGBA1C 5.8*    CBG: Recent Labs  Lab 05/29/21 1122 05/29/21 1612 05/29/21 2103 05/30/21 0602 05/30/21 1109  GLUCAP 147* 120* 189* 100* 161*    Lipid Profile: No results for input(s): CHOL, HDL, LDLCALC, TRIG, CHOLHDL, LDLDIRECT in the last 72 hours. Thyroid Function Tests: No results for input(s): TSH, T4TOTAL, FREET4, T3FREE, THYROIDAB in the last 72 hours. Anemia Panel: No results for input(s): VITAMINB12, FOLATE, FERRITIN, TIBC, IRON, RETICCTPCT in the last 72 hours. Sepsis Labs: No results for input(s): PROCALCITON, LATICACIDVEN in the last 168 hours.  Recent Results (from the past 240 hour(s))  SARS-COV-2 RNA,(COVID-19) QUAL NAAT     Status: None   Collection Time: 05/20/21  5:05 PM  Result Value Ref Range Status  SARS CoV2 RNA NOT DETECTED NOT DETECTED Final    Comment: . A Not Detected result means that SARS-CoV-2 RNA was not present in the specimen above the limit of detection. . A Not Detected result does not rule out the possibility of COVID-19 and should not be used as the sole basis for treatment or patient management decisions. If COVID-19 is still suspected, based on exposure history together with other clinical findings, re-testing should be  considered in the context of clinical observations and epidemiological data for patient management decisions. . Test Method: Nucleic Acid Amplification Test including reverse transcription polymerase chain reaction (RT-PCR) and transcription mediated amplification (TMA). The test method meets the [place] Centers for Disease Control and prevention (CDC) pre departure and arrival requirement for viral test for COVID-19 dated July 03, 2019. Testing requirements for traveling may change with time. The patient is responsible for determining the test requirements for each nation while they ar e traveling. . This test has been authorized by the FDA under an   Emergency Use Authorization (EUA) for use by authorized laboratories. . Please review the "Fact Sheets" and FDA authorized labeling available for health care providers and patients using the following websites: https://www.[facility].com/home/Covid-19/HCP/NAAT/fact-sheet2  https://www.[facility].com/home/Covid-19/Patients/NAAT/ fact-sheet2 . Due to the current public health emergency, [facility] is accepting samples from appropriate clinical sources collected using wide variety of swabs and transport media for COVID-19. Not detected test results derived from specimens received in non- commercially manufactured viral collection kits or those not yet authorized by FDA for COVID-19 testing should be cautiously evaluated and take extra precautions such as additional clinical monitoring, including collection of an additional specimen. . Additional information about COVID-19 can be fo und at the [employer] website: www.[facility].com/Covid19. . For patients with a Detected or Inconclusive test result, please see CDC's COVID-19 Treatments and  Medications page located at  BroadcastLocal.cz- severe-illness.html for information on COVID-19 therapeutics. . For patients with a Not Detected test result, please see CDC's Vaccines for COVID-19 page located at PhoneStatistics.is for information on COVID-19 vaccines.   Resp Panel by RT-PCR (Flu A&B, Covid) Nasopharyngeal Swab     Status: None   Collection Time: 05/27/21  2:08 PM   Specimen: Nasopharyngeal Swab; Nasopharyngeal(NP) swabs in vial transport medium  Result Value Ref Range Status   SARS Coronavirus 2 by RT PCR NEGATIVE NEGATIVE Final    Comment: (NOTE) SARS-CoV-2 target nucleic acids are NOT DETECTED.  The SARS-CoV-2 RNA is generally detectable in upper respiratory specimens during the acute phase of  infection. The lowest concentration of SARS-CoV-2 viral copies this assay can detect is 138 copies/mL. A negative result does not preclude SARS-Cov-2 infection and should not be used as the sole basis for treatment or other patient management decisions. A negative result may occur with  improper specimen collection/handling, submission of specimen other than nasopharyngeal swab, presence of viral mutation(s) within the areas targeted by this assay, and inadequate number of viral copies(<138 copies/mL). A negative result must be combined with clinical observations, patient history, and epidemiological information. The expected result is Negative.  Fact Sheet for Patients:  EntrepreneurPulse.com.au  Fact Sheet for Healthcare Providers:  IncredibleEmployment.be  This test is no t yet approved or cleared by the [place] FDA and  has been authorized for detection and/or diagnosis of SARS-CoV-2 by FDA under an Emergency Use Authorization (EUA). This EUA will remain  in effect (meaning this test can be used) for the duration of the COVID-19 declaration under Section 564(b)(1) of the Act,  21 U.S.C.section 360bbb-3(b)(1), unless the authorization is terminated  or revoked sooner.       Influenza A by PCR NEGATIVE NEGATIVE Final   Influenza B by PCR NEGATIVE NEGATIVE Final    Comment: (NOTE) The Xpert Xpress SARS-CoV-2/FLU/RSV plus assay is intended as an aid in the diagnosis of influenza from Nasopharyngeal swab specimens and should not be used as a sole basis for treatment. Nasal washings and aspirates are unacceptable for Xpert Xpress SARS-CoV-2/FLU/RSV testing.  Fact Sheet for Patients: EntrepreneurPulse.com.au  Fact Sheet for Healthcare Providers: IncredibleEmployment.be  This test is not yet approved or cleared by the [place] FDA and has been authorized for detection and/or diagnosis of SARS-CoV-2  by FDA under an Emergency Use Authorization (EUA). This EUA will remain in effect (meaning this test can be used) for the duration of the COVID-19 declaration under Section 564(b)(1) of the Act, 21 U.S.C. section 360bbb-3(b)(1), unless the authorization is terminated or revoked.  Performed at [facility] [place] [address], [place], [place] [address]        Radiology Studies: No results found.  Scheduled Meds:  aspirin EC  81 mg Oral Daily   budesonide (PULMICORT) nebulizer solution  0.25 mg Nebulization BID   enoxaparin (LOVENOX) injection  40 mg Subcutaneous QHS   feeding supplement  237 mL Oral BID BM   finasteride  5 mg Oral Daily   furosemide  40 mg Intravenous BID   gabapentin  300 mg Oral BID   insulin aspart  0-5 Units Subcutaneous QHS   insulin aspart  0-9 Units Subcutaneous TID WC   ipratropium-albuterol  3 mL Nebulization Q6H   mirabegron ER  25 mg Oral Daily   predniSONE  50 mg Oral Q breakfast   tamsulosin  0.4 mg Oral QPC supper   Continuous Infusions:   LOS: 3 days   Time spent: 28 minutes   Darliss Cheney, MD [facility]  05/30/2021, 1:31 PM  Please page via Amion and do not message via secure chat for anything urgent. Secure chat can be used for anything non urgent.  How to contact the [facility] Attending or Consulting provider [place] or covering provider during after hours [place], for this patient?  Check the care team in [facility] and look for a) attending/consulting TRH provider listed and b) the [facility] team listed. Page or secure chat [phone]. Log into [email or web address] and use [place]'s universal password to access. If you do not have the password, please contact the hospital operator. Locate the [facility] provider you are looking for under [facility] and page to a number that you can be directly reached. If you still have difficulty reaching the provider, please page the [facility] (Director on Call) for the Hospitalists listed on amion for  assistance.

## 2021-05-31 ENCOUNTER — Encounter (HOSPITAL_COMMUNITY)
Admission: EM | Disposition: A | Discharge status: Home / Self Care | Payer: Self-pay | Source: Home / Self Care | Attending: Family Medicine

## 2021-05-31 ENCOUNTER — Encounter (HOSPITAL_COMMUNITY): Payer: Self-pay | Admitting: Cardiovascular Disease

## 2021-05-31 DIAGNOSIS — I350 Nonrheumatic aortic (valve) stenosis: Secondary | ICD-10-CM | POA: Diagnosis not present

## 2021-05-31 DIAGNOSIS — J9601 Acute respiratory failure with hypoxia: Secondary | ICD-10-CM | POA: Diagnosis not present

## 2021-05-31 DIAGNOSIS — E6609 Other obesity due to excess calories: Secondary | ICD-10-CM | POA: Diagnosis not present

## 2021-05-31 DIAGNOSIS — I5033 Acute on chronic diastolic (congestive) heart failure: Secondary | ICD-10-CM | POA: Diagnosis not present

## 2021-05-31 DIAGNOSIS — I340 Nonrheumatic mitral (valve) insufficiency: Secondary | ICD-10-CM

## 2021-05-31 DIAGNOSIS — I509 Heart failure, unspecified: Secondary | ICD-10-CM | POA: Diagnosis not present

## 2021-05-31 HISTORY — PX: RIGHT/LEFT HEART CATH AND CORONARY ANGIOGRAPHY: CATH118266

## 2021-05-31 LAB — BASIC METABOLIC PANEL
Anion gap: 7 (ref 5–15)
BUN: 32 mg/dL — ABNORMAL HIGH (ref 8–23)
CO2: 45 mmol/L — ABNORMAL HIGH (ref 22–32)
Calcium: 8.5 mg/dL — ABNORMAL LOW (ref 8.9–10.3)
Chloride: 87 mmol/L — ABNORMAL LOW (ref 98–111)
Creatinine, Ser: 0.84 mg/dL (ref 0.61–1.24)
GFR, Estimated: >60 mL/min (ref >60)
Glucose, Bld: 99 mg/dL (ref 70–99)
Potassium: 4.8 mmol/L (ref 3.5–5.1)
Sodium: 139 mmol/L (ref 135–145)

## 2021-05-31 LAB — POCT I-STAT EG7
Acid-Base Excess: 18 mmol/L — ABNORMAL HIGH (ref 0.0–2.0)
Bicarbonate: 49.1 mmol/L — ABNORMAL HIGH (ref 20.0–28.0)
Calcium, Ion: 1.08 mmol/L — ABNORMAL LOW (ref 1.15–1.40)
HCT: 50 % (ref 39.0–52.0)
Hemoglobin: 17 g/dL (ref 13.0–17.0)
O2 Saturation: 70 %
Potassium: 4.5 mmol/L (ref 3.5–5.1)
Sodium: 137 mmol/L (ref 135–145)
TCO2: >50 mmol/L — ABNORMAL HIGH (ref 22–32)
pCO2, Ven: 82.2 mmHg (ref 44.0–60.0)
pH, Ven: 7.384 (ref 7.250–7.430)
pO2, Ven: 40 mmHg (ref 32.0–45.0)

## 2021-05-31 LAB — POCT I-STAT 7, (LYTES, BLD GAS, ICA,H+H)
Acid-Base Excess: 16 mmol/L — ABNORMAL HIGH (ref 0.0–2.0)
Bicarbonate: 46.5 mmol/L — ABNORMAL HIGH (ref 20.0–28.0)
Calcium, Ion: 0.98 mmol/L — ABNORMAL LOW (ref 1.15–1.40)
HCT: 48 % (ref 39.0–52.0)
Hemoglobin: 16.3 g/dL (ref 13.0–17.0)
O2 Saturation: 90 %
Potassium: 4.2 mmol/L (ref 3.5–5.1)
Sodium: 138 mmol/L (ref 135–145)
TCO2: 49 mmol/L — ABNORMAL HIGH (ref 22–32)
pCO2 arterial: 76.8 mmHg (ref 32.0–48.0)
pH, Arterial: 7.39 (ref 7.350–7.450)
pO2, Arterial: 64 mmHg — ABNORMAL LOW (ref 83.0–108.0)

## 2021-05-31 LAB — LIPID PANEL
Cholesterol: 159 mg/dL (ref 0–200)
HDL: 58 mg/dL (ref >40)
LDL Cholesterol: 88 mg/dL (ref 0–99)
Total CHOL/HDL Ratio: 2.7 RATIO
Triglycerides: 64 mg/dL (ref <150)
VLDL: 13 mg/dL (ref 0–40)

## 2021-05-31 LAB — GLUCOSE, CAPILLARY
Glucose-Capillary: 107 mg/dL — ABNORMAL HIGH (ref 70–99)
Glucose-Capillary: 200 mg/dL — ABNORMAL HIGH (ref 70–99)
Glucose-Capillary: 193 mg/dL — ABNORMAL HIGH (ref 70–99)
Glucose-Capillary: 140 mg/dL — ABNORMAL HIGH (ref 70–99)

## 2021-05-31 SURGERY — RIGHT/LEFT HEART CATH AND CORONARY ANGIOGRAPHY
Anesthesia: LOCAL

## 2021-05-31 MED ORDER — SODIUM CHLORIDE 0.9% FLUSH
3.0000 mL | Freq: Two times a day (BID) | INTRAVENOUS | Status: DC
  Administered 2021-05-31 – 2021-06-09 (×13): 3 mL via INTRAVENOUS

## 2021-05-31 MED ORDER — HYDRALAZINE HCL 20 MG/ML IJ SOLN: 10.0000 mg | INTRAMUSCULAR | Status: AC | PRN

## 2021-05-31 MED ORDER — HEPARIN (PORCINE) IN NACL 1000-0.9 UT/500ML-% IV SOLN
INTRAVENOUS | Status: AC
  Filled 2021-05-31: qty 1000

## 2021-05-31 MED ORDER — LABETALOL HCL 5 MG/ML IV SOLN: 10.0000 mg | INTRAVENOUS | Status: AC | PRN

## 2021-05-31 MED ORDER — ENOXAPARIN SODIUM 40 MG/0.4ML IJ SOSY
40.0000 mg | PREFILLED_SYRINGE | Freq: Every day | INTRAMUSCULAR | Status: DC
  Administered 2021-06-01 – 2021-06-05 (×5): 40 mg via SUBCUTANEOUS
  Filled 2021-05-31 (×5): qty 0.4

## 2021-05-31 MED ORDER — IOHEXOL 350 MG/ML SOLN
INTRAVENOUS | Status: DC | PRN
  Administered 2021-05-31: 14:00:00 60 mL

## 2021-05-31 MED ORDER — SODIUM CHLORIDE 0.9 % IV SOLN: 250.0000 mL | INTRAVENOUS | Status: DC | PRN

## 2021-05-31 MED ORDER — ROSUVASTATIN CALCIUM 20 MG PO TABS
20.0000 mg | ORAL_TABLET | Freq: Every day | ORAL | Status: DC
  Administered 2021-06-01 – 2021-06-06 (×6): 20 mg via ORAL
  Filled 2021-05-31 (×6): qty 1

## 2021-05-31 MED ORDER — HEPARIN SODIUM (PORCINE) 1000 UNIT/ML IJ SOLN
INTRAMUSCULAR | Status: AC
  Filled 2021-05-31: qty 10

## 2021-05-31 MED ORDER — LIDOCAINE HCL (PF) 1 % IJ SOLN
INTRAMUSCULAR | Status: AC
  Filled 2021-05-31: qty 30

## 2021-05-31 MED ORDER — VERAPAMIL HCL 2.5 MG/ML IV SOLN
INTRAVENOUS | Status: DC | PRN
  Administered 2021-05-31: 13:00:00 10 mL via INTRA_ARTERIAL

## 2021-05-31 MED ORDER — HEPARIN (PORCINE) IN NACL 1000-0.9 UT/500ML-% IV SOLN
INTRAVENOUS | Status: DC | PRN
  Administered 2021-05-31 (×2): 500 mL

## 2021-05-31 MED ORDER — LIDOCAINE HCL (PF) 1 % IJ SOLN
INTRAMUSCULAR | Status: DC | PRN
  Administered 2021-05-31 (×2): 2 mL

## 2021-05-31 MED ORDER — VERAPAMIL HCL 2.5 MG/ML IV SOLN
INTRAVENOUS | Status: AC
  Filled 2021-05-31: qty 2

## 2021-05-31 MED ORDER — HEPARIN SODIUM (PORCINE) 1000 UNIT/ML IJ SOLN
INTRAMUSCULAR | Status: DC | PRN
  Administered 2021-05-31: 5000 [IU] via INTRAVENOUS

## 2021-05-31 MED ORDER — FLUTICASONE FUROATE-VILANTEROL 200-25 MCG/ACT IN AEPB
1.0000 | INHALATION_SPRAY | Freq: Every day | RESPIRATORY_TRACT | Status: DC
  Administered 2021-06-01 – 2021-06-10 (×7): 1 via RESPIRATORY_TRACT
  Filled 2021-05-31 (×2): qty 28

## 2021-05-31 MED ORDER — SODIUM CHLORIDE 0.9% FLUSH: 3.0000 mL | INTRAVENOUS | Status: DC | PRN

## 2021-05-31 MED ORDER — SODIUM CHLORIDE 0.9% FLUSH
3.0000 mL | Freq: Two times a day (BID) | INTRAVENOUS | Status: DC
  Administered 2021-05-31: 09:00:00 3 mL via INTRAVENOUS

## 2021-05-31 MED ORDER — SODIUM CHLORIDE 0.9 % IV SOLN: INTRAVENOUS | Status: DC

## 2021-05-31 MED ORDER — SODIUM CHLORIDE 0.9 % IV SOLN: INTRAVENOUS | Status: AC

## 2021-05-31 MED ORDER — SODIUM CHLORIDE 0.9% FLUSH
3.0000 mL | INTRAVENOUS | Status: DC | PRN
  Administered 2021-06-08: 3 mL via INTRAVENOUS

## 2021-05-31 SURGICAL SUPPLY — 12 items
CATH BALLN WEDGE 5F 110CM (CATHETERS) ×1 IMPLANT
CATH INFINITI 5 FR JL3.5 (CATHETERS) ×1 IMPLANT
CATH INFINITI JR4 5F (CATHETERS) ×1 IMPLANT
DEVICE RAD COMP TR BAND LRG (VASCULAR PRODUCTS) ×1 IMPLANT
GLIDESHEATH SLEND SS 6F .021 (SHEATH) ×1 IMPLANT
GUIDEWIRE INQWIRE 1.5J.035X260 (WIRE) IMPLANT
INQWIRE 1.5J .035X260CM (WIRE) ×2
KIT HEART LEFT (KITS) ×2 IMPLANT
PACK CARDIAC CATHETERIZATION (CUSTOM PROCEDURE TRAY) ×2 IMPLANT
SHEATH GLIDE SLENDER 4/5FR (SHEATH) ×1 IMPLANT
TRANSDUCER W/STOPCOCK (MISCELLANEOUS) ×2 IMPLANT
TUBING CIL FLEX 10 FLL-RA (TUBING) ×2 IMPLANT

## 2021-05-31 NOTE — Consult Note (Addendum)
[place] Nurse Consult Note: Reason for Consult: Consult requested for bilat buttocks.  Pt is frequently incontinent of urine and has red moist macerated skin to bilat buttocks, inner perineum and groin.  Appearance is consistent with moisture associated skin damage and probable candidiasis.  An external pouch has been applied to attempt to contain urine, but the penis is small and retracted and it is difficult to maintain a seal.   ICD-10 CM Codes for Irritant Dermatitis L24A2 - Due to fecal, urinary or dual incontinence L24A9 - Due to friction or contact with other specified body fluids  There are 2 red areas to buttocks; 2X1X.1cm and .5X.5X.1cm, red and dry with shaggy irregular boarders.  Appearance and locations are consistent with friction/shear and moisture, not pressure injuries.  Dressing procedure/placement/frequency: Topical treatment orders provided for bedside nurses to perform as follows to protect skin and promote healing: Apply barrier cream and then antifungal powder to inner perineum/groin/bilat buttocks TID and with each turning and cleaning episode. Leave foam dressing off so it does not trap incontinence against skin. Please re-consult if further assistance is needed.  Thank-you,  Julien Girt MSN, [place], [place], [place], [place]

## 2021-05-31 NOTE — Consult Note (Addendum)
HEART AND [place] VALVE TEAM  Cardiology Consultation:   Patient ID: Kyle Lewis MRN: [id number]; DOB: [birth year]  Admit date: 05/27/2021 Date of Consult: 05/31/2021  Primary Care Provider: [name], MD [facility] Cardiologist: Jenean Lindau, MD   Patient Profile:   Kyle Lewis is a 75 y.o. male with a hx of medically managed CAD per cardiac catheterization 2019, AAA s/p repair 2016, previously moderate aortic stenosis>>now severe per echocardiogram, carotid artery disease with known RICA occlusion and 33-00% LICA disease from 7622, OSA on CPAP, hx of TIA 2013, COPD, ongoing tobacco use and HTN who is being seen today for the evaluation of severe aortic stenosis at the request of Dr. Gardiner Rhyme.  History of Present Illness:   Kyle Lewis lives at home with his wife in [place], [place]. They have two adult children who live nearby. HPI somewhat difficult to obtain due to patients poor awareness of the situation and sleepiness during our conversation. He reports that he is fairly sedentary at baseline this was confirmed after speaking with his wife via telephone. He spends most of his day watching television. He continues to drive and go to the grocery store however uses a motorized scooter at stores and a cane all other times for ambulation. He can walk up stairs and around his home but does feel significant dyspnea. He takes frequent breaks with ADL/IADL activities due to dyspnea. He thinks he was doing well about a year ago but has seen a decline in his functional ability since that time. He continues to smoke about a half pack a day but does not need home O2. Wife reports some mild dementia although he is not currently taking medication for this.   Kyle Lewis was referred to Dr. Oneida Alar after MRI performed for back pain showed 4.8cm abdominal aortic aneurysm. Repeat study showed increased growth to 5.3cm at which time surgical repair was  recommended however the patient initially deferred. He was then referred to Dr. Saunders Revel by 2018 for risk stratification prior to AAA repair. He was noted to be asymptomatic with the exception of exertional dyspnea, which prompted further testing with an echocardiogram and myocardial perfusion stress testing. He was found to have mild to moderate aortic stenosis at that time as well as an intermediate-risk stress test. Subsequent LHC 09/15/2016 revealed moderate severe but noncritical CAD involving mid PDA and OM2 without significant disease. Plan was to continue with medical management of his CAD and monitoring clinical symptoms of AS. He was placed on Plavix and Lipitor. He then underwent repeat LHC 01/24/2018 which showed moderate to severe disease involving distal RCA/RPDA and OM 2, moderate aortic stenosis. Echocardiogram on 01/30/2018 showed EF 65 to 70%, moderate AS (Vmax 3.7 m/s, mean gradient 32 mmHg, AVA 1.2 cm^2). Appears that he was lost to follow up since 2020 when he was last seen by Dr. Lennox Pippins. At that time, he continued to be asymptomatic and no changes were made to his regimen.   On 05/27/21, he presented to [facility] after a 1-2 week hx of worsening shortness of breath. He was seen by his PCP who prescribed a short course of prednisone and antibiotics for presumed COPD exacerbation. He was found to be hypoxic when he checked his O2 sats at home, prompting him to present to the ED. On ED presentation, SpO2 was at 94% on 4 L with stable vital signs. BNP was 1350, HsT at 58 > 54, WBC 7.7, Hb 16.6, Platelets 186,  COVID-19 negative, flu negative. CXR with mild cardiomegaly with small right pleural effusion. EKG with NSR.  Echocardiogram this admission showed normal LV systolic function however severe aortic stenosis with a mean gradient 59 mmHg, AVA 0.6 cm, and DI 0.18. It was felt that his acute CHF was driven by severe AS.   Plan was to proceed with [facility] performed today which showed mild LAD stenosis,  moderate dLCx disease, moderate RCA disease and moderate ostial RPDA to RPDA disease with confirmed severe AS with a mean gradient at 50.32mmHg, peak 19mmHg, and AVA 0.93cm2.   Structural heart team has been consulted for further evaluation and possible TAVR.     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   Abscess of left leg    Acute kidney injury superimposed on chronic kidney disease ([place]) 01/10/2018   Adenomatous colon polyp    Adrenal mass (HCC)    2 benign appearing left adrenal adenomas noted on 01/13/15 CT   Allergy    seasonal   Aortic stenosis    mild to moderate by echo and cath in 09/2016   Arthritis    Carotid artery occlusion    Cataract    Chronic back pain    COPD (chronic obstructive pulmonary disease) (HCC)    Coronary artery disease    CVA (cerebral infarction)    Degeneration of cervical intervertebral disc    Diverticulosis    History of back surgery    Rods and Screws in back   History of left knee replacement    Hypertension    Meningioma (HCC)    Nocturia    Peripheral vascular disease ([place])    Ringing in ear    (SLIGHT)   Ruptured abdominal aortic aneurysm (AAA) 07/01/2015   Formatting of this note might be different from the original. Overview:  Last evaluation by ultrasounds with diameter of 5 cm follow-up by Dr. Oneida Alar last seen in June 10, 2015 Formatting of this note might be different from the original. Last evaluation by ultrasounds with diameter of 5 cm follow-up by Dr. Oneida Alar last seen in June 10, 2015   Sleep apnea    wears BIPAP set on 5 to 9   Stroke ([place]) 2013   tia no residual deficit from    Past Surgical History:  Procedure Laterality Date   ABDOMINAL AORTIC ANEURYSM REPAIR  2019   [place]  as child   BACK SURGERY  2018   Rods and Screws in Back lower back   BRAIN MENINGIOMA [place]     left    EYE SURGERY Bilateral    Cataract   IRRIGATION AND DEBRIDEMENT ABSCESS Left 08/25/2020   Procedure: IRRIGATION AND DEBRIDEMENT ABSCESS LEFT LEG;  Surgeon: Evelina Bucy, DPM;  Location: [facility];  Service: Podiatry;  Laterality: Left;   JOINT REPLACEMENT Left 04-01-12   Knee   LEFT HEART CATH AND CORONARY ANGIOGRAPHY N/A 09/15/2016   Procedure: Left Heart Cath and Coronary Angiography;  Surgeon: Nelva Bush, MD;  Location: [facility];  Service: Cardiovascular;  Laterality: N/A;   MAXIMUM ACCESS (MAS)POSTERIOR LUMBAR INTERBODY FUSION (PLIF) 1 LEVEL N/A 07/07/2015   Procedure:  POSTERIOR LUMBAR INTERBODY FUSION (PLIF) Lumbar Four-Five with Pedicle Screw Fixation Lumbar Two-Five;Laminectomy Lumbar Two-Five;  Surgeon: Eustace Moore, MD;  Location: [facility];  Service: Neurosurgery;  Laterality: N/A;   POSTERIOR LUMBAR INTERBODY FUSION (PLIF) Lumbar Four-Five with Pedicle Screw Fixation Lumbar Two-Five;Laminectomy Lumbar Two-Five   RIGHT/LEFT HEART CATH AND CORONARY ANGIOGRAPHY N/A 01/24/2018   Procedure: RIGHT/LEFT HEART CATH AND CORONARY ANGIOGRAPHY;  Surgeon: Nelva Bush, MD;  Location: [place] [facility];  Service: Cardiovascular;  Laterality: N/A;   TONSILLECTOMY  as child   TOTAL KNEE ARTHROPLASTY  04/01/2012   Procedure: TOTAL KNEE ARTHROPLASTY;  Surgeon: Gearlean Alf, MD;  Location: [facility];  Service: Orthopedics;  Laterality: Left;   TRANSURETHRAL RESECTION OF PROSTATE     UMBILICAL HERNIA REPAIR  2011    Inpatient Medications: Scheduled Meds:  aspirin EC  81 mg Oral Daily   budesonide (PULMICORT) nebulizer solution  0.25 mg Nebulization BID   enoxaparin (LOVENOX) injection  40 mg Subcutaneous QHS   feeding supplement  237 mL Oral BID BM   finasteride  5 mg Oral Daily   furosemide  40 mg Intravenous BID   gabapentin  300 mg Oral BID   insulin aspart  0-5 Units Subcutaneous QHS   insulin aspart  0-9 Units Subcutaneous TID WC    ipratropium-albuterol  3 mL Nebulization Q6H   mirabegron ER  25 mg Oral Daily   rosuvastatin  20 mg Oral Daily   sodium chloride flush  3 mL Intravenous Q12H   tamsulosin  0.4 mg Oral QPC supper   Continuous Infusions:  sodium chloride     sodium chloride 100 mL/hr at 05/31/21 0619   PRN Meds: sodium chloride, acetaminophen **OR** acetaminophen, ondansetron **OR** ondansetron (ZOFRAN) IV, sodium chloride flush  Allergies:    Allergies  Allergen Reactions   Cefazolin Rash and Other (See Comments)    The patient had surgery and was given cefazolin intraop. ~ 10 days later he developed a rash confirmed by biopsy to be consistent w/ drug eruption. We cannot know for sure, but this is the most likely agent.      Social History:   Social History   Socioeconomic History   Marital status: Married    Spouse name: [name]    Number of children: 2   Years of education: College    Highest education level: Not on file  Occupational History    Comment: Self employed Administrator retired  Tobacco Use   Smoking status: Former    Packs/day: 0.75    Years: 40.00    Pack years: 30.00    Types: Cigarettes    Quit date: 07/20/2019    Years since quitting: 1.8   Smokeless tobacco: Never   Tobacco comments:    1/2 pack a day if that   Vaping Use   Vaping Use: Never used  Substance and Sexual Activity   Alcohol use: Yes    Comment: rare   Drug use: No   Sexual activity: Not Currently  Other Topics Concern   Not on file  Social History Narrative   Patient lives at home with his wife. Rhonda    Patient has a Financial risk analyst.    Patient has 2 sons.    Patient smokes a half pack a day.          Social Determinants of Health   Financial Resource Strain: Not on file  Food Insecurity: Not on file  Transportation Needs: Not on file  Physical Activity: Not on file  Stress: Not on file  Social Connections: Not on file  Intimate Partner Violence: Not on file    Family  History:     Family History  Problem Relation Age of Onset   Heart disease Mother        Onset ~44 y/o   Hypertension Mother        Deceased from old age at 60   Hyperlipidemia Mother    Arthritis Mother    Diabetes Father        Deceased from old age at 76   Heart attack Father    Heart disease Father        CABG at age 75   Dementia Father 16   Arthritis Father    Prostate cancer Maternal Grandfather    Colon cancer Neg Hx    Esophageal cancer Neg Hx    Rectal cancer Neg Hx    Stomach cancer Neg Hx     ROS:  Please see the history of present illness.   All other ROS reviewed and negative.     Physical Exam/Data:   Vitals:   05/31/21 0446 05/31/21 0700 05/31/21 0808 05/31/21 0900  BP: 128/72 (!) 141/69  (!) 148/81  Pulse: 63 66  73  Resp:      Temp: 98 F (36.7 C)     TempSrc:      SpO2: 91% 94% 94% 94%  Weight: 99.3 kg     Height:        Intake/Output Summary (Last 24 hours) at 05/31/2021 1135 Last data filed at 05/31/2021 0700 Gross per 24 hour  Intake 1800 ml  Output 3075 ml  Net -1275 ml   Last 3 Weights 05/31/2021 05/30/2021 05/29/2021  Weight (lbs) 218 lb 14.7 oz 219 lb 9.3 oz 222 lb 3.6 oz  Weight (kg) 99.3 kg 99.6 kg 100.8 kg     Body mass index is 35.33 kg/m.   General: Ill appearing, NAD Skin: Warm, dry Neck: No JVD Lungs: Diminished in bilateral lower lobes, upper lobes wheezing. No rales, or rhonchi. Breathing is mildly labored with communication  Cardiovascular: RRR with S1 S2. + AS radiating murmur Abdomen: Soft, non-tender, non-distended. No obvious abdominal masses. Extremities: No edema.  Neuro: Alert and oriented however slow to respond to questioning. No focal deficits. No facial asymmetry. MAE spontaneously. Psych: Responds to questions appropriately with normal affect.    EKG:  The EKG was personally reviewed and demonstrates:  NSR with IVCD, HR 88bpm  Telemetry:  Telemetry was personally reviewed and demonstrates: NSR with rates in the  70's   Relevant CV Studies:  [facility] 05/31/21:     Prox LAD lesion is 25% stenosed.   Dist Cx lesion is 40% stenosed.   Ost 2nd Mrg to 2nd Mrg lesion is 70% stenosed.   Dist RCA lesion is 60% stenosed.   Ost RPDA to RPDA lesion is 40% stenosed.   The LAD is a large caliber vessel that courses to the apex. There is mild mid LAD stenosis The Circumflex is a large caliber vessel that gives off a small obtuse marginal branch then 2 moderate caliber obtuse marginal branches. The second obtuse marginal branch has a moderate stenosis, unchanged from last cath in 2019.  The RCA is a large dominant artery. The distal RCA has a moderate stenosis, unchanged from last cath in 2019.  Severe aortic stenosis (mean gradient 50.6 mmHg, 59 mmHg, AVA 0.93 cm2).    Recommendations: Will continue workup for TAVR. His right and left heart pressures are only mildly abnormal following inpatient diuresis. Continue diuresis. The structural heart team will provide a formal consultation  regarding his candidacy for TAVR.    Echocardiogram 05/28/21:   1. Left ventricular ejection fraction, by estimation, is approximately  55%. The left ventricle has normal function. Left ventricular endocardial  border not optimally defined to evaluate regional wall motion. There is  severe concentric left ventricular  hypertrophy. Left ventricular diastolic parameters are consistent with  Grade I diastolic dysfunction (impaired relaxation). Elevated left  ventricular end-diastolic pressure.   2. Right ventricular systolic function is normal. The right ventricular  size is normal. There is mildly elevated pulmonary artery systolic  pressure. The estimated right ventricular systolic pressure is 82.9 mmHg.   3. Left atrial size was mildly dilated.   4. Right atrial size was mildly dilated.   5. There is trivial pericardial effusion surrounding the apex.   6. The mitral valve is degenerative. Moderate mitral valve regurgitation  and  very eccentric. The mean mitral valve gradient is 3.0 mmHg. Moderate  mitral annular calcification.   7. Tricuspid valve regurgitation is moderate.   8. The aortic valve is tricuspid. There is severe calcifcation of the  aortic valve. Aortic valve regurgitation is not visualized. Severe aortic  valve stenosis. Aortic valve mean gradient measures 56.5 mmHg.  Dimentionless index 0.19.   9. The inferior vena cava is dilated in size with <50% respiratory  variability, suggesting right atrial pressure of 15 mmHg.    Echocardiogram 01/30/2018:  - Left ventricle: The cavity size was normal. There was moderate    focal basal hypertrophy of the septum. Systolic function was    vigorous. The estimated ejection fraction was in the range of 65%    to 70%. Wall motion was normal; there were no regional wall    motion abnormalities. Doppler parameters are consistent with    abnormal left ventricular relaxation (grade 1 diastolic    dysfunction).  - Aortic valve: Trileaflet; severely thickened, severely calcified    leaflets. Valve mobility was restricted. There was moderate    stenosis. Peak velocity (S): 366 cm/s. Mean gradient (S): 32 mm    Hg. Peak gradient (S): 54 mm Hg. Valve area (VTI): 1.23 cm^2.    Valve area (Vmax): 1.4 cm^2. Valve area (Vmean): 1.37 cm^2.  - Mitral valve: There was trivial regurgitation.  - Pulmonary arteries: Systolic pressure was mildly increased. PA    peak pressure: 33 mm Hg (S).  R/LHC 01/24/2018:  Conclusions: Stable appearance of coronary arteries since 09/2016.  Moderate to severe disease involving the distal RCA/rPDA and OM2 is not significantly changed and involves relatively small vessels.  Recent troponin elevation in [place] was likely due to supply-demand mismatch in the setting of heat stroke/hyperthermia, moderate aortic stenosis, and moderate to severe CAD involving small branches. Upper normal to mildly elevated left and right heart filling  pressures. Normal Fick cardiac output/index. Moderate aortic stenosis.   Recommendations: Continue medical therapy.  Given minimal symptoms and stable disease since prior catheterization in 09/2016, I do not recommend PCI to small to moderate sized OM2 and rPDA branches at this time. Obtain outpatient echo to reassess aortic valve, given discrepant findings between recent outside echo (severe AS) and today's catheterization. Patient wishes to follow-up with Dr. Geraldo Pitter in [place]; we will arrange for appointment in ~2 weeks.  If his symptoms remain stable or improve and echo confirms non-critical aortic stenosis, I think it is reasonable for Kyle Lewis to begin driving again.   Recommend continuation of clopidogrel 75 mg daily for moderate CAD and history of AAA status post endovascular  repair.  Laboratory Data:  High Sensitivity Troponin:   Recent Labs  Lab 05/27/21 1420 05/27/21 1904  TROPONINIHS 58* 54*     Chemistry Recent Labs  Lab 05/29/21 0015 05/30/21 0705 05/31/21 0419  NA 143 141 139  K 4.6 4.3 4.8  CL 96* 91* 87*  CO2 42* 44* 45*  GLUCOSE 127* 90 99  BUN 22 22 32*  CREATININE 1.16 0.73 0.84  CALCIUM 8.2* 8.3* 8.5*  GFRNONAA >60 >60 >60  ANIONGAP 5 6 7     Recent Labs  Lab 05/27/21 1420 05/28/21 0026  PROT 6.2* 5.5*  ALBUMIN 3.7 3.3*  AST 19 22  ALT 31 29  ALKPHOS 56 48  BILITOT 0.8 1.1   Hematology Recent Labs  Lab 05/27/21 1420 05/28/21 0026  WBC 7.7 5.8  RBC 5.20 4.94  HGB 16.6 15.7  HCT 52.8* 48.4  MCV 101.5* 98.0  MCH 31.9 31.8  MCHC 31.4 32.4  RDW 14.5 14.2  PLT 186 167   BNP Recent Labs  Lab 05/27/21 1420  BNP 1,348.7*    DDimer No results for input(s): DDIMER in the last 168 hours.   Radiology/Studies:  DG Chest 2 View  Result Date: 05/27/2021 CLINICAL DATA:  Shortness of breath, cough EXAM: CHEST - 2 VIEW COMPARISON:  04/07/2019 FINDINGS: Eventration of the right hemidiaphragm. Small right and trace left pleural effusions.  Associated bibasilar atelectasis. No frank interstitial edema.  Mild cardiomegaly.  No pneumothorax. IMPRESSION: Mild cardiomegaly with small right and trace left pleural effusions. Associated bibasilar atelectasis. No frank interstitial edema. Electronically Signed   By: Julian Hy M.D.   On: 05/27/2021 14:30   ECHOCARDIOGRAM COMPLETE  Result Date: 05/28/2021    ECHOCARDIOGRAM REPORT   Patient Name:   Kyle Lewis Date of Exam: 05/28/2021 Medical Rec #:  [id number]       Height:       66.0 in Accession #:    [id number]      Weight:       226.6 lb Date of Birth:  Oct 18, 1945      BSA:          2.109 m Patient Age:    74 years        BP:           139/81 mmHg Patient Gender: M               HR:           71 bpm. Exam Location:  Inpatient Procedure: 2D Echo, Cardiac Doppler and Color Doppler Indications:    CHF  History:        Patient has prior history of Echocardiogram examinations, most                 recent 01/30/2018. CAD and Previous Myocardial Infarction, COPD,                 Aortic Valve Disease; Risk Factors:Former Smoker, Hypertension                 and Dyslipidemia. Hx stroke.  Sonographer:    Clayton Lefort RDCS (AE) Referring Phys: [place]  1. Left ventricular ejection fraction, by estimation, is approximately 55%. The left ventricle has normal function. Left ventricular endocardial border not optimally defined to evaluate regional wall motion. There is severe concentric left ventricular hypertrophy. Left ventricular diastolic parameters are consistent with Grade I diastolic dysfunction (impaired relaxation). Elevated left ventricular end-diastolic pressure.  2. Right ventricular  systolic function is normal. The right ventricular size is normal. There is mildly elevated pulmonary artery systolic pressure. The estimated right ventricular systolic pressure is 01.0 mmHg.  3. Left atrial size was mildly dilated.  4. Right atrial size was mildly dilated.  5. There is trivial  pericardial effusion surrounding the apex.  6. The mitral valve is degenerative. Moderate mitral valve regurgitation and very eccentric. The mean mitral valve gradient is 3.0 mmHg. Moderate mitral annular calcification.  7. Tricuspid valve regurgitation is moderate.  8. The aortic valve is tricuspid. There is severe calcifcation of the aortic valve. Aortic valve regurgitation is not visualized. Severe aortic valve stenosis. Aortic valve mean gradient measures 56.5 mmHg. Dimentionless index 0.19.  9. The inferior vena cava is dilated in size with <50% respiratory variability, suggesting right atrial pressure of 15 mmHg. Comparison(s): Prior images reviewed side by side. Aortic stenosis has progressed and in severe range. Mitral regurgitation is very eccentric and likely moderate. FINDINGS  Left Ventricle: Left ventricular ejection fraction, by estimation, is 55%. The left ventricle has normal function. Left ventricular endocardial border not optimally defined to evaluate regional wall motion. The left ventricular internal cavity size was normal in size. There is severe concentric left ventricular hypertrophy. Left ventricular diastolic parameters are consistent with Grade I diastolic dysfunction (impaired relaxation). Elevated left ventricular end-diastolic pressure. Right Ventricle: The right ventricular size is normal. No increase in right ventricular wall thickness. Right ventricular systolic function is normal. There is mildly elevated pulmonary artery systolic pressure. The tricuspid regurgitant velocity is 2.37  m/s, and with an assumed right atrial pressure of 15 mmHg, the estimated right ventricular systolic pressure is 93.2 mmHg. Left Atrium: Left atrial size was mildly dilated. Right Atrium: Right atrial size was mildly dilated. Pericardium: Trivial pericardial effusion is present. The pericardial effusion is surrounding the apex. Mitral Valve: The mitral valve is degenerative in appearance. There is mild  thickening of the mitral valve leaflet(s). There is mild calcification of the mitral valve leaflet(s). Moderate mitral annular calcification. Moderate mitral valve regurgitation.  MV peak gradient, 4.9 mmHg. The mean mitral valve gradient is 3.0 mmHg. Tricuspid Valve: The tricuspid valve is grossly normal. Tricuspid valve regurgitation is moderate. Aortic Valve: The aortic valve is tricuspid. There is severe calcifcation of the aortic valve. Aortic valve regurgitation is not visualized. Severe aortic stenosis is present. Aortic valve mean gradient measures 56.5 mmHg. Aortic valve peak gradient measures 93.5 mmHg. Aortic valve area, by VTI measures 0.61 cm. Pulmonic Valve: The pulmonic valve was grossly normal. Pulmonic valve regurgitation is trivial. Aorta: The aortic root is normal in size and structure. Venous: The inferior vena cava is dilated in size with less than 50% respiratory variability, suggesting right atrial pressure of 15 mmHg. IAS/Shunts: No atrial level shunt detected by color flow Doppler.  LEFT VENTRICLE PLAX 2D LVIDd:         4.10 cm   Diastology LVIDs:         2.80 cm   LV e' medial:    4.35 cm/s LV PW:         2.50 cm   LV E/e' medial:  18.2 LV IVS:        2.00 cm   LV e' lateral:   6.85 cm/s LVOT diam:     2.00 cm   LV E/e' lateral: 11.5 LV SV:         67 LV SV Index:   32 LVOT Area:     3.14 cm  RIGHT VENTRICLE             IVC RV Basal diam:  4.40 cm     IVC diam: 2.80 cm RV Mid diam:    3.40 cm RV S prime:     17.80 cm/s TAPSE (M-mode): 2.6 cm LEFT ATRIUM             Index        RIGHT ATRIUM           Index LA diam:        3.90 cm 1.85 cm/m   RA Area:     23.90 cm LA Vol (A2C):   82.8 ml 39.26 ml/m  RA Volume:   79.30 ml  37.60 ml/m LA Vol (A4C):   76.5 ml 36.27 ml/m LA Biplane Vol: 82.5 ml 39.12 ml/m  AORTIC VALVE AV Area (Vmax):    0.59 cm AV Area (Vmean):   0.58 cm AV Area (VTI):     0.61 cm AV Vmax:           483.50 cm/s AV Vmean:          352.000 cm/s AV VTI:            1.105  m AV Peak Grad:      93.5 mmHg AV Mean Grad:      56.5 mmHg LVOT Vmax:         90.10 cm/s LVOT Vmean:        65.500 cm/s LVOT VTI:          0.213 m LVOT/AV VTI ratio: 0.19  AORTA Ao Root diam: 3.50 cm Ao Asc diam:  3.10 cm MITRAL VALVE                  TRICUSPID VALVE MV Area (PHT): 2.42 cm       TR Peak grad:   22.5 mmHg MV Area VTI:   2.14 cm       TR Vmax:        237.00 cm/s MV Peak grad:  4.9 mmHg MV Mean grad:  3.0 mmHg       SHUNTS MV Vmax:       1.11 m/s       Systemic VTI:  0.21 m MV Vmean:      76.2 cm/s      Systemic Diam: 2.00 cm MV Decel Time: 313 msec MR Peak grad:    64.0 mmHg MR Mean grad:    39.0 mmHg MR Vmax:         400.00 cm/s MR Vmean:        290.0 cm/s MR PISA:         1.57 cm MR PISA Eff ROA: 12 mm MR PISA Radius:  0.50 cm MV E velocity: 79.10 cm/s MV A velocity: 101.00 cm/s MV E/A ratio:  0.78 Rozann Lesches MD Electronically signed by Rozann Lesches MD Signature Date/Time: 05/28/2021/2:45:46 PM    Final     (Addend note) STS Risk Calculator: Procedure: AV Replacement  Risk of Mortality: 3.131% Renal Failure: 1.599% Permanent Stroke: 2.236% Prolonged Ventilation: 11.996% DSW Infection: 0.271% Reoperation: 3.374% Morbidity or Mortality: 15.631% Short Length of Stay: 29.024% Long Length of Stay: 7.181%  [facility] Cardiomyopathy Questionnaire  KCCQ-12 05/31/2021  1 a. Ability to shower/bathe Quite a bit limited  1 b. Ability to walk 1 block Other, Did not do  1 c. Ability to hurry/jog Other, Did not do  2. Edema feet/ankles/legs Less than once a  week  3. Limited by fatigue All of the time  4. Limited by dyspnea All of the time  5. Sitting up / on 3+ pillows 1-2 times a week  6. Limited enjoyment of life Limited quite a bit  7. Rest of life w/ symptoms Mostly dissatisfied  8 a. Participation in hobbies Severely limited  8 b. Participation in chores Severely limited  8 c. Visiting family/friends Moderately limited    Assessment and Plan:   Kyle Lewis is a 75 y.o. male with symptoms of severe, stage D1 aortic stenosis with NYHA Class III symptoms. I have reviewed the patient's recent echocardiogram which is notable for normal LV systolic function and severe aortic stenosis with peak gradient of 93.46mmHg and mean transvalvular gradient of 56.73mmHg. The patient's dimensionless index is 0.19 and calculated aortic valve area is 0.61 cm.    I have reviewed the natural history of aortic stenosis with the patient. We have discussed the limitations of medical therapy and the poor prognosis associated with symptomatic aortic stenosis. We have reviewed potential treatment options, including palliative medical therapy, conventional surgical aortic valve replacement, and transcatheter aortic valve replacement. We discussed treatment options in the context of this patient's specific comorbid medical conditions.    The patient's predicted risk of mortality with conventional aortic valve replacement is 0.925% primarily based on acute diastolic CHF, CAD, carotid artery stenosis and ongoing tobacco use. Other significant comorbid conditions include sleep apnea, memory issues, obesity, and sedentary lifestyle. TAVR seems like a reasonable treatment option for this patient pending formal cardiac surgical consultation. We discussed typical evaluation which will require a gated cardiac CTA and a CTA of the chest/abdomen/pelvis to evaluate both his cardiac anatomy and peripheral vasculature. He has not been seen by a dentist in many years therefore will obtain dental pantogram tomorrow. He was previously followed by Dr. Oneida Alar for carotid artery disease with known total occlusion of the RICA and 34-19% of the LICA. Given this, will obtain doppler studies as well.   For questions or updates, please contact [place] Please consult [email or web address] for contact info under    Signed, Kathyrn Drown, NP  05/31/2021 11:35 AM  I have personally seen and examined this  patient. I agree with the assessment and plan as outlined above.  He is a 75 yo male with history of CAD, AAA s/p repair, carotid artery disease, sleep apnea, prior TIA, COPD, ongoing tobacco abuse, HTN and severe aortic stenosis. He is admitted with acute systolic CHF.  Echo with severe aortic stenosis with mean gradient 56.5 mmHg. LVEF=55%. Cardiac cath today with stable moderate distal RCA and moderate OM2 stenosis. Hemodynamics during cardiac cath confirm severe aortic stenosis with mean gradient 50.6 mmHg. He has been diuresed over the past few days and reports symptomatic improvement. His right and left heart pressures are mildly abnormal by cath today.  My exam: I agree with above. Elderly male in NAD. FX:TKWI harsh systolic murmur. Pulm: scattered wheezes. Ext: No LE edema I have personally reviewed his echo images, labs and EKG.  Plan: We will continue planning for TAVR. We will arrange dental imaging tomorrow as well as carotid artery dopplers. If his renal function remains stable post cath, we can try to arrange pre TAVR CT scans prior to discharge home. He will also need to be seen by Dr. Cyndia Bent with CT surgery.   Lauree Chandler 05/31/2021 4:27 PM

## 2021-05-31 NOTE — TOC Progression Note (Signed)
Transition of Care [facility]) - Progression Note    Patient Details  Name: MERLAND HOLNESS MRN: [id number] Date of Birth: 1946/01/21  Transition of [facility]) CM/SW Contact  Zenon Mayo, RN Phone Number: 05/31/2021, 9:15 AM  Clinical Narrative:    Per patient son, he will need PTAR transport at discharge.         Expected Discharge Plan and Services                                                 Social Determinants of Health (SDOH) Interventions    Readmission Risk Interventions No flowsheet data found.

## 2021-05-31 NOTE — Interval H&P Note (Signed)
History and Physical Interval Note:  05/31/2021 12:56 PM  Kyle Lewis  has presented today for surgery, with the diagnosis of hf.  The various methods of treatment have been discussed with the patient and family. After consideration of risks, benefits and other options for treatment, the patient has consented to  Procedure(s): RIGHT/LEFT HEART CATH AND CORONARY ANGIOGRAPHY (N/A) as a surgical intervention.  The patient's history has been reviewed, patient examined, no change in status, stable for surgery.  I have reviewed the patient's chart and labs.  Questions were answered to the patient's satisfaction.    Cath Lab Visit (complete for each Cath Lab visit)  Clinical Evaluation Leading to the Procedure:   ACS: No.  Non-ACS:    Anginal Classification: CCS I  Anti-ischemic medical therapy: No Therapy  Non-Invasive Test Results: No non-invasive testing performed  Prior CABG: No previous CABG        Lauree Chandler

## 2021-05-31 NOTE — Progress Notes (Signed)
PROGRESS NOTE    Kyle Lewis  ERD:[id number] DOB: 12-30-1945 DOA: 05/27/2021 PCP: [name], MD   Brief Narrative:  75 year old white male with a history of COPD, OSA, hypertension, prior history of AAA repair presented to the hospital with worsening shortness of breath.  Patient been treated about 7 to 10 days ago with prednisone, antibiotics for presumed COPD exacerbation.  Patient 's wife called his PCP on the day of admission due to patient having hypoxia at home with reported room air sats of 58%.  911 was activated.On arrival sats were 94% on 4 L.  Blood pressure 175/95.  BNP 1348.  No prior BNP is available.  COVID and flu negative.   Patient was admitted to hospital service due to acute hypoxic respiratory failure secondary to acute on chronic diastolic congestive heart failure likely secondary to severe aortic stenosis.   Assessment & Plan:   Principal Problem:   Acute CHF (congestive heart failure) (HCC) Active Problems:   OSA (obstructive sleep apnea)   Exogenous obesity   Tobacco use   Severe aortic stenosis   Acute respiratory failure with hypoxia and hypercapnia (HCC)   Acute on chronic diastolic heart failure (HCC)  Acute respiratory failure with hypoxia secondary to acute on chronic diastolic congestive heart failure moderate to severe aortic stenosis/respiratory acidosis: Previous echo done in August 2019 shows normal ejection fraction but grade 1 diastolic dysfunction.  Cardiology is on board.Repeat echo shows severe aortic stenosis.  Patient had worsened respiratory distress on 05/30/2091 when he needed to be placed on BiPAP for severe respiratory cirrhosis with CO2 of 120.  He is now requiring intermittent BiPAP.  Mostly on 5 L of oxygen, denies any shortness of breath.  Repeat ABG yesterday shows CO2 80.  Repeat ABGs today are still pending.  He remains on IV Lasix, net -6.2 L since admission.Underwent right and left heart cath and was found to have stable  multivessel CAD but no target for stents and no recommendations for any other surgical intervention.  Continue medical management for that.  Structural cardiology team is notified we will work him up for TAVR.  Mild acute COPD exacerbation: No more wheezes, he still has bibasilar crackles.  He is on 5 L of oxygen.   OSA (obstructive sleep apnea) Chronic.  Continue home BiPAP.   Morbid obesity: Weight loss counseling provided.   Tobacco abuse: Patient advised to stop smoking.  Prediabetes: Hemoglobin A1c 5.8.  Continue SSI.  BPH: Continue Flomax.  DVT prophylaxis: enoxaparin (LOVENOX) injection 40 mg Start: 05/27/21 2315 SCDs Start: 05/27/21 2310   Code Status: Full Code  Family Communication:  None present at bedside.   Status is: Inpatient  Remains inpatient appropriate because: Needs work-up for TAVR.  Estimated body mass index is 35.33 kg/m as calculated from the following:   Height as of this encounter: 5\' 6"  (1.676 m).   Weight as of this encounter: 99.3 kg.   Nutritional Assessment: Body mass index is 35.33 kg/m.Marland Kitchen Seen by dietician.  I agree with the assessment and plan as outlined below: Nutrition Status:   Skin Assessment: I have examined the patient's skin and I agree with the wound assessment as performed by the wound care RN as outlined below:  Consultants:  Cardiology  Procedures:  None  Antimicrobials:  Anti-infectives (From admission, onward)    None          Subjective:  Seen and examined.  No complaints.  Fully alert and oriented but does have  some degree of dementia.  Denies shortness of breath.  Objective: Vitals:   05/31/21 1317 05/31/21 1322 05/31/21 1326 05/31/21 1331  BP: (!) 142/86 139/81 137/76 134/79  Pulse: 71 74 76 77  Resp: 18 (!) 27 12 12   Temp:      TempSrc:      SpO2: 93% 91% 91% (!) 88%  Weight:      Height:        Intake/Output Summary (Last 24 hours) at 05/31/2021 1403 Last data filed at 05/31/2021 0700 Gross  per 24 hour  Intake 1380 ml  Output 2425 ml  Net -1045 ml    Filed Weights   05/29/21 0452 05/30/21 0500 05/31/21 0446  Weight: 100.8 kg 99.6 kg 99.3 kg    Examination:  General exam: Appears calm and comfortable  Respiratory system: Faint crackles at the bases bilaterally. Respiratory effort normal. Cardiovascular system: S1 & S2 heard, RRR. No JVD, rubs, gallops or clicks.  Loud systolic murmur, trace pitting edema bilateral lower extremity.  Gastrointestinal system: Abdomen is nondistended, soft and nontender. No organomegaly or masses felt. Normal bowel sounds heard. Central nervous system: Alert and oriented. No focal neurological deficits. Extremities: Symmetric 5 x 5 power. Skin: No rashes, lesions or ulcers.  Psychiatry: Judgement and insight appear poor.  Data Reviewed: I have personally reviewed following labs and imaging studies  CBC: Recent Labs  Lab 05/27/21 1420 05/28/21 0026 05/31/21 1320  WBC 7.7 5.8  --   NEUTROABS 6.1 5.3  --   HGB 16.6 15.7 16.3  HCT 52.8* 48.4 48.0  MCV 101.5* 98.0  --   PLT 186 167  --     Basic Metabolic Panel: Recent Labs  Lab 05/27/21 1420 05/28/21 0026 05/29/21 0015 05/30/21 0705 05/31/21 0419 05/31/21 1320  NA 144 139 143 141 139 138  K 4.1 4.1 4.6 4.3 4.8 4.2  CL 104 97* 96* 91* 87*  --   CO2 34* 36* 42* 44* 45*  --   GLUCOSE 123* 210* 127* 90 99  --   BUN 23 18 22 22  32*  --   CREATININE 0.74 0.81   0.82 1.16 0.73 0.84  --   CALCIUM 8.4* 7.9* 8.2* 8.3* 8.5*  --   MG  --  2.0 2.3 2.2  --   --     GFR: Estimated Creatinine Clearance: 83.8 mL/min (by C-G formula based on SCr of 0.84 mg/dL). Liver Function Tests: Recent Labs  Lab 05/27/21 1420 05/28/21 0026  AST 19 22  ALT 31 29  ALKPHOS 56 48  BILITOT 0.8 1.1  PROT 6.2* 5.5*  ALBUMIN 3.7 3.3*    No results for input(s): LIPASE, AMYLASE in the last 168 hours. No results for input(s): AMMONIA in the last 168 hours. Coagulation Profile: No results for  input(s): INR, PROTIME in the last 168 hours. Cardiac Enzymes: No results for input(s): CKTOTAL, CKMB, CKMBINDEX, TROPONINI in the last 168 hours. BNP (last 3 results) No results for input(s): PROBNP in the last 8760 hours. HbA1C: No results for input(s): HGBA1C in the last 72 hours.  CBG: Recent Labs  Lab 05/30/21 1109 05/30/21 1622 05/30/21 2107 05/31/21 0613 05/31/21 1141  GLUCAP 161* 196* 156* 107* 200*    Lipid Profile: Recent Labs    05/31/21 0419  CHOL 159  HDL 58  LDLCALC 88  TRIG 64  CHOLHDL 2.7   Thyroid Function Tests: No results for input(s): TSH, T4TOTAL, FREET4, T3FREE, THYROIDAB in the last 72 hours. Anemia Panel:  No results for input(s): VITAMINB12, FOLATE, FERRITIN, TIBC, IRON, RETICCTPCT in the last 72 hours. Sepsis Labs: No results for input(s): PROCALCITON, LATICACIDVEN in the last 168 hours.  Recent Results (from the past 240 hour(s))  Resp Panel by RT-PCR (Flu A&B, Covid) Nasopharyngeal Swab     Status: None   Collection Time: 05/27/21  2:08 PM   Specimen: Nasopharyngeal Swab; Nasopharyngeal(NP) swabs in vial transport medium  Result Value Ref Range Status   SARS Coronavirus 2 by RT PCR NEGATIVE NEGATIVE Final    Comment: (NOTE) SARS-CoV-2 target nucleic acids are NOT DETECTED.  The SARS-CoV-2 RNA is generally detectable in upper respiratory specimens during the acute phase of infection. The lowest concentration of SARS-CoV-2 viral copies this assay can detect is 138 copies/mL. A negative result does not preclude SARS-Cov-2 infection and should not be used as the sole basis for treatment or other patient management decisions. A negative result may occur with  improper specimen collection/handling, submission of specimen other than nasopharyngeal swab, presence of viral mutation(s) within the areas targeted by this assay, and inadequate number of viral copies(<138 copies/mL). A negative result must be combined with clinical observations,  patient history, and epidemiological information. The expected result is Negative.  Fact Sheet for Patients:  EntrepreneurPulse.com.au  Fact Sheet for Healthcare Providers:  IncredibleEmployment.be  This test is no t yet approved or cleared by the [place] FDA and  has been authorized for detection and/or diagnosis of SARS-CoV-2 by FDA under an Emergency Use Authorization (EUA). This EUA will remain  in effect (meaning this test can be used) for the duration of the COVID-19 declaration under Section 564(b)(1) of the Act, 21 U.S.C.section 360bbb-3(b)(1), unless the authorization is terminated  or revoked sooner.       Influenza A by PCR NEGATIVE NEGATIVE Final   Influenza B by PCR NEGATIVE NEGATIVE Final    Comment: (NOTE) The Xpert Xpress SARS-CoV-2/FLU/RSV plus assay is intended as an aid in the diagnosis of influenza from Nasopharyngeal swab specimens and should not be used as a sole basis for treatment. Nasal washings and aspirates are unacceptable for Xpert Xpress SARS-CoV-2/FLU/RSV testing.  Fact Sheet for Patients: EntrepreneurPulse.com.au  Fact Sheet for Healthcare Providers: IncredibleEmployment.be  This test is not yet approved or cleared by the [place] FDA and has been authorized for detection and/or diagnosis of SARS-CoV-2 by FDA under an Emergency Use Authorization (EUA). This EUA will remain in effect (meaning this test can be used) for the duration of the COVID-19 declaration under Section 564(b)(1) of the Act, 21 U.S.C. section 360bbb-3(b)(1), unless the authorization is terminated or revoked.  Performed at [facility], [place] [address] [place], [place] [address]        Radiology Studies: CARDIAC CATHETERIZATION  Result Date: 05/31/2021   Prox LAD lesion is 25% stenosed.   Dist Cx lesion is 40% stenosed.   Ost 2nd Mrg to 2nd Mrg lesion is 70%  stenosed.   Dist RCA lesion is 60% stenosed.   Ost RPDA to RPDA lesion is 40% stenosed. The LAD is a large caliber vessel that courses to the apex. There is mild mid LAD stenosis The Circumflex is a large caliber vessel that gives off a small obtuse marginal branch then 2 moderate caliber obtuse marginal branches. The second obtuse marginal branch has a moderate stenosis, unchanged from last cath in 2019. The RCA is a large dominant artery. The distal RCA has a moderate stenosis, unchanged from last cath in 2019. Severe aortic  stenosis (mean gradient 50.6 mmHg, 59 mmHg, AVA 0.93 cm2). Recommendations: Will continue workup for TAVR. His right and left heart pressures are only mildly abnormal following inpatient diuresis. Continue diuresis. The structural heart team will provide a formal consultation regarding his candidacy for TAVR.    Scheduled Meds:  [MAR Hold] aspirin EC  81 mg Oral Daily   [MAR Hold] budesonide (PULMICORT) nebulizer solution  0.25 mg Nebulization BID   [MAR Hold] enoxaparin (LOVENOX) injection  40 mg Subcutaneous QHS   [MAR Hold] feeding supplement  237 mL Oral BID BM   [MAR Hold] finasteride  5 mg Oral Daily   [MAR Hold] furosemide  40 mg Intravenous BID   [MAR Hold] gabapentin  300 mg Oral BID   [MAR Hold] insulin aspart  0-5 Units Subcutaneous QHS   [MAR Hold] insulin aspart  0-9 Units Subcutaneous TID WC   [MAR Hold] ipratropium-albuterol  3 mL Nebulization Q6H   [MAR Hold] mirabegron ER  25 mg Oral Daily   [MAR Hold] rosuvastatin  20 mg Oral Daily   sodium chloride flush  3 mL Intravenous Q12H   [MAR Hold] tamsulosin  0.4 mg Oral QPC supper   Continuous Infusions:  sodium chloride     sodium chloride 100 mL/hr at 05/31/21 0619     LOS: 4 days   Time spent: 26 minutes   Darliss Cheney, MD [facility]  05/31/2021, 2:03 PM  Please page via Shea Evans and do not message via secure chat for anything urgent. Secure chat can be used for anything non urgent.  How to  contact the [facility] Attending or Consulting provider [place] or covering provider during after hours [place], for this patient?  Check the care team in [facility] and look for a) attending/consulting TRH provider listed and b) the [facility] team listed. Page or secure chat [phone]. Log into [email or web address] and use [place]'s universal password to access. If you do not have the password, please contact the hospital operator. Locate the [facility] provider you are looking for under [facility] and page to a number that you can be directly reached. If you still have difficulty reaching the provider, please page the [facility] (Director on Call) for the Hospitalists listed on amion for assistance.

## 2021-05-31 NOTE — Consult Note (Signed)
NAME:  Kyle Lewis, MRN:  [id number], DOB:  1946/05/10, LOS: 4 ADMISSION DATE:  05/27/2021, CONSULTATION DATE:  05/28/21 REFERRING MD:  Doristine Bosworth, CHIEF COMPLAINT:  Hypercapnic respiratory failure   History of Present Illness:  8M with PMHx siginficant for COPD with moderately sevevere obstructive defect, OSA on nocturnal bipap, coronary artery disease and severe aortic stenosis whom we are asked to see in consultation re: hypoxic/hypercapnic respiratory failure.  He was admitted on 12/23 to the hospitalist service with cc of 10 days of SOB, treated at the start of his course with prednisone and an antibiotic as an outpatient. He did not improve and was noted to be hypoxic at home. On arrival HDS and requiring 4L [place] (no home O2 requirement). Labs were notable for a BNP elevated to 1348 with new history of ankle swelling. He was admitted with plan for diuresis for presumed CHF exacerbation in setting of aortic stenosis and possible TAVR workup.Also treated for potential COPD exacerbation as had wheeze on arrival. Cardiology service saw today and recommended continued attempts at cautious diuresis with plans for RHC/LHC later this week for TAVR workup. Notably, his weight was noted to be stable since 11/201 at 103kg  The evening of 05/28/21 was noted to become obtunded. ABG demonstrated uncompensated acute on chronic respiratory acidosis with 7.15/>120/80/44. NIPPV was initiated (had not been on prior during this hospital stay per RT). On my exam this evening he is awake and alert on BIPAP, Spo2 100%)  Pertinent  Medical History  COPD (PFTs 2020 FEV1 57% oof predicted, DLCO 66% of predicted) OSA requiring nocturnal Bipap (IPAP 23, EPAP 19) Obesity Severe aortic stenosis Coronary artery disease AAA repair Tobacco use, ongoing HFpEF  Significant Hospital Events: Including procedures, antibiotic start and stop dates in addition to other pertinent events   05/27/21 - Admitted to hospitalist  service for CHF exacerbation, TAVR workup for new severe AS 05/28/21 - in PM, patient altered. ABG 7.15/>120/80.5/44. Placed on NIPPV 12/25 ABG with pH normalized. Chronic hypercarbic respiratory failure  Interim History / Subjective:  Tolerating BiPAP overnight. Weaned to 3-4L via [place]. Good UOP in last 24 hours  Objective   Blood pressure 106/66, pulse 81, temperature 98 F (36.7 C), resp. rate 19, height 5\' 6"  (1.676 m), weight 99.3 kg, SpO2 (!) 88 %.        Intake/Output Summary (Last 24 hours) at 05/31/2021 1614 Last data filed at 05/31/2021 1500 Gross per 24 hour  Intake 1389.32 ml  Output 2425 ml  Net -1035.68 ml   Filed Weights   05/29/21 0452 05/30/21 0500 05/31/21 0446  Weight: 100.8 kg 99.6 kg 99.3 kg   Physical Exam: General: Chronically ill-appearing, no acute distress HENT: [place], AT, OP clear, MMM Eyes: EOMI, no scleral icterus Respiratory: Diminished breath sounds bilaterally.  No crackles, wheezing or rales Cardiovascular: RRR, -M/R/G, no JVD Extremities:-Edema,-tenderness Neuro: Drowsy but AAO x4, CNII-XII grossly intact  Cr 0.84 Imaging, labs and test noted above have been reviewed independently by me.  Assessment & Plan:   #Acute Hypoxic and Hypercapnic Respiratory Failure #Severe OSA/OHS #Acute on chronic diastolic heart failure #Severe aortic stenosis #Moderately severe COPD On admission required  4L nasal cannula. Became altered evening of 05/28/21 with significant uncompensated respiratory acidosis noted on ABG. Improved on BiPAP. Cardiology consulted for AS and patient currently undergoing evaluation for TAVR. S/p left and right heart cath with multivessel disease but no target for stents.  --Wean supplemental O2 for goal SpO2 >88% --BiPAP nightly while inpatient.  Will need outpatient sleep evaluation --Complete steroids x 5 days --Diurese for goal net negative daily --STOP Pulmicort. Start Breo 200-25 mcg ONE puff ONCE a day --Evaluation for TAVR  per Cardiology  Will arrange follow-up with Pulmonary (Dr. Melvyn Novas) in January  Pulmonary will be available as needed  Critical care time: N/A    Care Time: 36 min  Rodman Pickle, M.D. [facility] Pulmonary/Critical Care Medicine 05/31/2021 4:16 PM   See Amion for personal pager For hours between 7 PM to 7 AM, please call [place] for urgent questions

## 2021-05-31 NOTE — Progress Notes (Signed)
Went to patient room to place on BIPAP.  Pt eating dinner.  I told him I would be back in about 30 min to see if he was napping.  I explained it is important that he use BIPAP during day when napping and also at night.  Patient was agreeable.

## 2021-06-01 ENCOUNTER — Inpatient Hospital Stay (HOSPITAL_COMMUNITY): Payer: Medicare Other

## 2021-06-01 ENCOUNTER — Other Ambulatory Visit (HOSPITAL_COMMUNITY): Payer: Self-pay

## 2021-06-01 DIAGNOSIS — I350 Nonrheumatic aortic (valve) stenosis: Secondary | ICD-10-CM | POA: Diagnosis not present

## 2021-06-01 DIAGNOSIS — I509 Heart failure, unspecified: Secondary | ICD-10-CM | POA: Diagnosis not present

## 2021-06-01 DIAGNOSIS — I779 Disorder of arteries and arterioles, unspecified: Secondary | ICD-10-CM | POA: Diagnosis not present

## 2021-06-01 DIAGNOSIS — I340 Nonrheumatic mitral (valve) insufficiency: Secondary | ICD-10-CM | POA: Diagnosis not present

## 2021-06-01 LAB — GLUCOSE, CAPILLARY
Glucose-Capillary: 99 mg/dL (ref 70–99)
Glucose-Capillary: 122 mg/dL — ABNORMAL HIGH (ref 70–99)
Glucose-Capillary: 121 mg/dL — ABNORMAL HIGH (ref 70–99)
Glucose-Capillary: 132 mg/dL — ABNORMAL HIGH (ref 70–99)

## 2021-06-01 LAB — BASIC METABOLIC PANEL
Anion gap: 7 (ref 5–15)
BUN: 33 mg/dL — ABNORMAL HIGH (ref 8–23)
CO2: 43 mmol/L — ABNORMAL HIGH (ref 22–32)
Calcium: 8.5 mg/dL — ABNORMAL LOW (ref 8.9–10.3)
Chloride: 86 mmol/L — ABNORMAL LOW (ref 98–111)
Creatinine, Ser: 0.85 mg/dL (ref 0.61–1.24)
GFR, Estimated: >60 mL/min (ref >60)
Glucose, Bld: 138 mg/dL — ABNORMAL HIGH (ref 70–99)
Potassium: 4.4 mmol/L (ref 3.5–5.1)
Sodium: 136 mmol/L (ref 135–145)

## 2021-06-01 MED ORDER — IOHEXOL 350 MG/ML SOLN
100.0000 mL | Freq: Once | INTRAVENOUS | Status: AC | PRN
  Administered 2021-06-01: 12:00:00 100 mL via INTRAVENOUS

## 2021-06-01 MED ORDER — FUROSEMIDE 40 MG PO TABS
40.0000 mg | ORAL_TABLET | Freq: Every day | ORAL | Status: DC
  Administered 2021-06-02 – 2021-06-10 (×8): 40 mg via ORAL
  Filled 2021-06-01 (×8): qty 1

## 2021-06-01 NOTE — Progress Notes (Signed)
Progress Note  Patient Name: Kyle Lewis Date of Encounter: 06/01/2021  [facility] Cardiologist: Jenean Lindau, MD   Subjective   Doing well this morning. No chest pain. Right radial access site c/d/I. Breathing improved.   Cath with 25% prox LAD, 40% distal Lcx. 70% OM2, 60% distal RCA, 40% RPDA. AoV mean gradient 50.63mmHg, AVA 0.93. LVEDP 10-30mmHg, PCWP 10-81mmHg. Recommended for medical management of CAD and currently undergoing work-up for TAVR.   Inpatient Medications    Scheduled Meds:  aspirin EC  81 mg Oral Daily   enoxaparin (LOVENOX) injection  40 mg Subcutaneous QHS   feeding supplement  237 mL Oral BID BM   finasteride  5 mg Oral Daily   fluticasone furoate-vilanterol  1 puff Inhalation Daily   furosemide  40 mg Intravenous BID   gabapentin  300 mg Oral BID   insulin aspart  0-5 Units Subcutaneous QHS   insulin aspart  0-9 Units Subcutaneous TID WC   ipratropium-albuterol  3 mL Nebulization Q6H   mirabegron ER  25 mg Oral Daily   rosuvastatin  20 mg Oral Daily   sodium chloride flush  3 mL Intravenous Q12H   tamsulosin  0.4 mg Oral QPC supper   Continuous Infusions:  sodium chloride     PRN Meds: sodium chloride, acetaminophen **OR** acetaminophen, iohexol, ondansetron **OR** ondansetron (ZOFRAN) IV, sodium chloride flush   Vital Signs    Vitals:   06/01/21 0433 06/01/21 0747 06/01/21 0853 06/01/21 0855  BP: 122/71     Pulse: 76     Resp: 19     Temp:  97.8 F (36.6 C)    TempSrc:  Oral    SpO2: 97% 91% 92% 92%  Weight:      Height:        Intake/Output Summary (Last 24 hours) at 06/01/2021 0941 Last data filed at 06/01/2021 6834 Gross per 24 hour  Intake 1629.32 ml  Output 1450 ml  Net 179.32 ml    Last 3 Weights 06/01/2021 05/31/2021 05/30/2021  Weight (lbs) 209 lb 7 oz 218 lb 14.7 oz 219 lb 9.3 oz  Weight (kg) 95 kg 99.3 kg 99.6 kg      Telemetry    NSR- Personally Reviewed  ECG    No new tracing- Personally  Reviewed  Physical Exam   GEN: Comfortable, on 5L [place]: JVD difficult to assess given habitus Cardiac: RRR, 3/6 harsh systolic murmur Respiratory: Diminished. Faint expiratory wheezing GI: Obese, soft, ND MS: No edema; No deformity. Neuro:  Nonfocal  Psych: Normal affect   Labs    High Sensitivity Troponin:   Recent Labs  Lab 05/27/21 1420 05/27/21 1904  TROPONINIHS 58* 54*      Chemistry Recent Labs  Lab 05/27/21 1420 05/28/21 0026 05/29/21 0015 05/30/21 0705 05/31/21 0419 05/31/21 1320 05/31/21 1323 06/01/21 0824  NA 144 139 143 141 139 138 137 136  K 4.1 4.1 4.6 4.3 4.8 4.2 4.5 4.4  CL 104 97* 96* 91* 87*  --   --  86*  CO2 34* 36* 42* 44* 45*  --   --  43*  GLUCOSE 123* 210* 127* 90 99  --   --  138*  BUN 23 18 22 22  32*  --   --  33*  CREATININE 0.74 0.81   0.82 1.16 0.73 0.84  --   --  0.85  CALCIUM 8.4* 7.9* 8.2* 8.3* 8.5*  --   --  8.5*  MG  --  2.0 2.3 2.2  --   --   --   --   PROT 6.2* 5.5*  --   --   --   --   --   --   ALBUMIN 3.7 3.3*  --   --   --   --   --   --   AST 19 22  --   --   --   --   --   --   ALT 31 29  --   --   --   --   --   --   ALKPHOS 56 48  --   --   --   --   --   --   BILITOT 0.8 1.1  --   --   --   --   --   --   GFRNONAA >60 >60   >60 >60 >60 >60  --   --  >60  ANIONGAP 6 6 5 6 7   --   --  7     Lipids  Recent Labs  Lab 05/31/21 0419  CHOL 159  TRIG 64  HDL 58  LDLCALC 88  CHOLHDL 2.7     Hematology Recent Labs  Lab 05/27/21 1420 05/28/21 0026 05/31/21 1320 05/31/21 1323  WBC 7.7 5.8  --   --   RBC 5.20 4.94  --   --   HGB 16.6 15.7 16.3 17.0  HCT 52.8* 48.4 48.0 50.0  MCV 101.5* 98.0  --   --   MCH 31.9 31.8  --   --   MCHC 31.4 32.4  --   --   RDW 14.5 14.2  --   --   PLT 186 167  --   --     Thyroid No results for input(s): TSH, FREET4 in the last 168 hours.  BNP Recent Labs  Lab 05/27/21 1420  BNP 1,348.7*     DDimer No results for input(s): DDIMER in the last 168 hours.   Radiology     CARDIAC CATHETERIZATION  Result Date: 05/31/2021   Prox LAD lesion is 25% stenosed.   Dist Cx lesion is 40% stenosed.   Ost 2nd Mrg to 2nd Mrg lesion is 70% stenosed.   Dist RCA lesion is 60% stenosed.   Ost RPDA to RPDA lesion is 40% stenosed. The LAD is a large caliber vessel that courses to the apex. There is mild mid LAD stenosis The Circumflex is a large caliber vessel that gives off a small obtuse marginal branch then 2 moderate caliber obtuse marginal branches. The second obtuse marginal branch has a moderate stenosis, unchanged from last cath in 2019. The RCA is a large dominant artery. The distal RCA has a moderate stenosis, unchanged from last cath in 2019. Severe aortic stenosis (mean gradient 50.6 mmHg, 59 mmHg, AVA 0.93 cm2). Recommendations: Will continue workup for TAVR. His right and left heart pressures are only mildly abnormal following inpatient diuresis. Continue diuresis. The structural heart team will provide a formal consultation regarding his candidacy for TAVR.    Cardiac Studies   Cath 05/31/21:   Prox LAD lesion is 25% stenosed.   Dist Cx lesion is 40% stenosed.   Ost 2nd Mrg to 2nd Mrg lesion is 70% stenosed.   Dist RCA lesion is 60% stenosed.   Ost RPDA to RPDA lesion is 40% stenosed.   The LAD is a large caliber vessel that courses to the apex. There is mild mid LAD  stenosis The Circumflex is a large caliber vessel that gives off a small obtuse marginal branch then 2 moderate caliber obtuse marginal branches. The second obtuse marginal branch has a moderate stenosis, unchanged from last cath in 2019.  The RCA is a large dominant artery. The distal RCA has a moderate stenosis, unchanged from last cath in 2019.  Severe aortic stenosis (mean gradient 50.6 mmHg, 59 mmHg, AVA 0.93 cm2).    Recommendations: Will continue workup for TAVR. His right and left heart pressures are only mildly abnormal following inpatient diuresis. Continue diuresis. The structural heart  team will provide a formal consultation regarding his candidacy for TAVR.   TTE 05/28/21 1. Left ventricular ejection fraction, by estimation, is approximately  55%. The left ventricle has normal function. Left ventricular endocardial  border not optimally defined to evaluate regional wall motion. There is  severe concentric left ventricular  hypertrophy. Left ventricular diastolic parameters are consistent with  Grade I diastolic dysfunction (impaired relaxation). Elevated left  ventricular end-diastolic pressure.   2. Right ventricular systolic function is normal. The right ventricular  size is normal. There is mildly elevated pulmonary artery systolic  pressure. The estimated right ventricular systolic pressure is 50.9 mmHg.   3. Left atrial size was mildly dilated.   4. Right atrial size was mildly dilated.   5. There is trivial pericardial effusion surrounding the apex.   6. The mitral valve is degenerative. Moderate mitral valve regurgitation  and very eccentric. The mean mitral valve gradient is 3.0 mmHg. Moderate  mitral annular calcification.   7. Tricuspid valve regurgitation is moderate.   8. The aortic valve is tricuspid. There is severe calcifcation of the  aortic valve. Aortic valve regurgitation is not visualized. Severe aortic  valve stenosis. Aortic valve mean gradient measures 56.5 mmHg.  Dimentionless index 0.19.   9. The inferior vena cava is dilated in size with <50% respiratory  variability, suggesting right atrial pressure of 15 mmHg.   Patient Profile     75 y.o. male with a hx of CAD rx medically, AAA s/p repair, CKD III, AS, carotid disease, OSA on CPAP, CVA, COPD, HTN who presented with worsening SOB and hypoxia found to have severe AS on TTE and elevated BNP for which Cardiology was consulted. Now undergoing TAVR work-up.  Assessment & Plan    #Severe aortic stenosis:  Echo 05/28/2021 showed severe aortic stenosis (V-max 4.9 m/s, mean gradient 59 mmHg,  AVA 0.6 cm, DI 0.18).  Cath 12/27 with confirmed severe AS with mean gradient 66mmHg and moderate CAD recommended for medical management. Currently undergoing work-up for possible TAVR. -Structural heart team consulted, appreciate recommendations -Plan for CT TAVR protocol, dental imaging and carotid artery dopplers  #Acute respiratory failure with hypoxia and hypercapnia:  Likely multifactorial with diastolic heart failure due to severe aortic stenosis and COPD and OHS/OSA.  Developed confusion on 12/24 and ABG showed profound respiratory acidosis (PCO2 greater than 120, pH 7.157).  PCCM consulted, he improved on BiPAP and now weaned to 5L [place]. -Weaning [place] for goal spO2>88% -BiPAP nightly while in-patient, will need outpatient sleep study -Diuresis as below -On Breo and nebs  #Acute on chronic diastolic CHF:  Presented with volume overload in setting of severe aortic stenosis as above. Filling pressures mildly elevated on cath 12/27 with LVEDP 10-58mmhg; PCWP 10-59mmhg.  -Continue lasix 40mg  IV BID for today; transition to PO dosing tomorrow -Will add spiro/SGLT2i as able pending renal function post-cath/CT TAVR -Monitor I/Os and daily weights  #  Mitral regurgitation:  Very eccentric jet (Coanda effect), and not well visualized on TTE.  Low E wave velocity with E less than A argues against severe MR.  Suspect likely moderate MR and can monitor for improvement following treatment of his severe AS. -Management of severe AS as above -Diuresis as above -Will need repeat TTE as out-patient for monitoring  #CAD:  Cath in 2019 showed moderate to severe disease involving distal RCA and OM 2, relatively small vessels and medical management was recommended.  Cath 05/31/2021 with continued moderate RCA and OM2 disease. Recommended for continued medical management. -Continue ASA 81mg  daily -Continue crestor 20mg  daily  For questions or updates, please contact [place] Please consult  [email or web address] for contact info under        Signed, Freada Bergeron, MD  06/01/2021, 9:41 AM

## 2021-06-01 NOTE — Consult Note (Addendum)
HEART AND [place] VALVE TEAM  Cardiology Consultation:   Patient ID: Kyle Lewis MRN: [id number]; DOB: 10-Jul-1945  Admit date: 05/27/2021 Date of Consult: 06/01/2021  Primary Care Provider: [name], MD [facility] Cardiologist: Jenean Lindau, MD   Patient Profile:   Kyle Lewis is a 75 y.o. male with a hx of medically managed CAD per cardiac catheterization 2019, AAA s/p repair 2016, previously moderate aortic stenosis>>now severe per echocardiogram, carotid artery disease with known RICA occlusion and 37-90% LICA disease from 2409, OSA on CPAP, hx of TIA 2013, COPD, ongoing tobacco use and HTN who is being seen today for the evaluation of severe aortic stenosis at the request of Dr. Angelena Form.  History of Present Illness:   Kyle Lewis lives at home with his wife in [place], [place]. They have two adult children who live nearby. HPI somewhat difficult to obtain due to patients poor awareness of the situation and sleepiness during our conversation. He reports that he is fairly sedentary at baseline this was confirmed after speaking with his wife via telephone. He spends most of his day watching television. He continues to drive and go to the grocery store however uses a motorized scooter at stores and a cane all other times for ambulation. He can walk up stairs and around his home but does feel significant dyspnea. He takes frequent breaks with ADL/IADL activities due to dyspnea. He thinks he was doing well about a year ago but has seen a decline in his functional ability since that time. He continues to smoke about a half pack a day but does not need home O2. Wife reports some mild dementia although he is not currently taking medication for this.    Kyle Lewis was referred to Dr. Oneida Alar after MRI performed for back pain showed 4.8cm abdominal aortic aneurysm. Repeat study showed increased growth to 5.3cm at which time surgical repair was  recommended however the patient initially deferred. He was then referred to Dr. Saunders Revel by 2018 for risk stratification prior to AAA repair. He was noted to be asymptomatic with the exception of exertional dyspnea, which prompted further testing with an echocardiogram and myocardial perfusion stress testing. He was found to have mild to moderate aortic stenosis at that time as well as an intermediate-risk stress test. Subsequent LHC 09/15/2016 revealed moderate severe but noncritical CAD involving mid PDA and OM2 without significant disease. Plan was to continue with medical management of his CAD and monitoring clinical symptoms of AS. He was placed on Plavix and Lipitor. He then underwent repeat LHC 01/24/2018 which showed moderate to severe disease involving distal RCA/RPDA and OM 2, moderate aortic stenosis. Echocardiogram on 01/30/2018 showed EF 65 to 70%, moderate AS (Vmax 3.7 m/s, mean gradient 32 mmHg, AVA 1.2 cm^2). Appears that he was lost to follow up since 2020 when he was last seen by Dr. Lennox Pippins. At that time, he continued to be asymptomatic and no changes were made to his regimen.    On 05/27/21, he presented to [facility] after a 1-2 week hx of worsening shortness of breath. He was seen by his PCP who prescribed a short course of prednisone and antibiotics for presumed COPD exacerbation. He was found to be hypoxic when he checked his O2 sats at home, prompting him to present to the ED. On ED presentation, SpO2 was at 94% on 4 L with stable vital signs. BNP was 1350, HsT at 58 > 54, WBC 7.7, Hb 16.6,  Platelets 186, COVID-19 negative, flu negative. CXR with mild cardiomegaly with small right pleural effusion. EKG with NSR.  Echocardiogram this admission showed normal LV systolic function however severe aortic stenosis with a mean gradient 59 mmHg, AVA 0.6 cm, and DI 0.18. It was felt that his acute CHF was driven by severe AS.    Plan was to proceed with [facility] performed 05/31/21 which showed mild LAD stenosis,  moderate dLCx disease, moderate RCA disease and moderate ostial RPDA to RPDA disease with confirmed severe AS with a mean gradient at 50.33mmHg, peak 53mmHg, and AVA 0.93cm2. Structural heart team was consulted for further evaluation and possible TAVR versus SAVR.   Today Kyle Lewis has no specific complaints. I spoke at length to his wife yesterday afternoon regarding our plans. He has no chest pain, SOB, LE edema, palpitations or orthopnea symptoms.   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   Abscess of left leg    Acute kidney injury superimposed on chronic kidney disease ([place]) 01/10/2018   Adenomatous colon polyp    Adrenal mass (HCC)    2 benign appearing left adrenal adenomas noted on 01/13/15 CT   Allergy    seasonal   Aortic stenosis    mild to moderate by echo and cath in 09/2016   Arthritis    Carotid artery occlusion    Cataract    Chronic back pain    COPD (chronic obstructive pulmonary disease) (HCC)    Coronary artery disease    CVA (cerebral infarction)    Degeneration of cervical intervertebral disc    Diverticulosis    History of back surgery    Rods and Screws in back   History of left knee replacement    Hypertension    Meningioma (HCC)    Nocturia    Peripheral vascular disease ([place])    Ringing in ear    (SLIGHT)   Ruptured abdominal aortic aneurysm (AAA) 07/01/2015   Formatting of this note might be different from the original. Overview:  Last evaluation by ultrasounds with diameter of 5 cm follow-up by Dr. Oneida Alar last seen in June 10, 2015 Formatting of this note might be different from the original. Last evaluation by ultrasounds with diameter of 5 cm follow-up by Dr. Oneida Alar last seen in June 10, 2015   Sleep apnea    wears BIPAP set on 5 to 9   Stroke ([place]) 2013   tia no residual deficit from    Past Surgical History:  Procedure Laterality Date   ABDOMINAL AORTIC ANEURYSM REPAIR   2019   [place]  as child   BACK SURGERY  2018   Rods and Screws in Back lower back   BRAIN MENINGIOMA [place]     left   EYE SURGERY Bilateral    Cataract   IRRIGATION AND DEBRIDEMENT ABSCESS Left 08/25/2020   Procedure: IRRIGATION AND DEBRIDEMENT ABSCESS LEFT LEG;  Surgeon: Evelina Bucy, DPM;  Location: [place];  Service: Podiatry;  Laterality: Left;   JOINT REPLACEMENT Left 04-01-12   Knee   LEFT HEART CATH AND CORONARY ANGIOGRAPHY N/A 09/15/2016   Procedure: Left Heart Cath and Coronary Angiography;  Surgeon: Nelva Bush, MD;  Location: [place] [facility];  Service: Cardiovascular;  Laterality: N/A;   MAXIMUM ACCESS (MAS)POSTERIOR LUMBAR INTERBODY FUSION (PLIF) 1  LEVEL N/A 07/07/2015   Procedure:  POSTERIOR LUMBAR INTERBODY FUSION (PLIF) Lumbar Four-Five with Pedicle Screw Fixation Lumbar Two-Five;Laminectomy Lumbar Two-Five;  Surgeon: Eustace Moore, MD;  Location: [place] [facility];  Service: Neurosurgery;  Laterality: N/A;   POSTERIOR LUMBAR INTERBODY FUSION (PLIF) Lumbar Four-Five with Pedicle Screw Fixation Lumbar Two-Five;Laminectomy Lumbar Two-Five   RIGHT/LEFT HEART CATH AND CORONARY ANGIOGRAPHY N/A 01/24/2018   Procedure: RIGHT/LEFT HEART CATH AND CORONARY ANGIOGRAPHY;  Surgeon: Nelva Bush, MD;  Location: [place] [facility];  Service: Cardiovascular;  Laterality: N/A;   RIGHT/LEFT HEART CATH AND CORONARY ANGIOGRAPHY N/A 05/31/2021   Procedure: RIGHT/LEFT HEART CATH AND CORONARY ANGIOGRAPHY;  Surgeon: Burnell Blanks, MD;  Location: [place] [facility];  Service: Cardiovascular;  Laterality: N/A;   TONSILLECTOMY  as child   TOTAL KNEE ARTHROPLASTY  04/01/2012   Procedure: TOTAL KNEE ARTHROPLASTY;  Surgeon: Gearlean Alf, MD;  Location: [facility];  Service: Orthopedics;  Laterality: Left;   TRANSURETHRAL RESECTION OF PROSTATE     UMBILICAL HERNIA REPAIR  2011    Inpatient  Medications: Scheduled Meds:  aspirin EC  81 mg Oral Daily   enoxaparin (LOVENOX) injection  40 mg Subcutaneous QHS   feeding supplement  237 mL Oral BID BM   finasteride  5 mg Oral Daily   fluticasone furoate-vilanterol  1 puff Inhalation Daily   furosemide  40 mg Intravenous BID   [START ON 06/02/2021] furosemide  40 mg Oral Daily   gabapentin  300 mg Oral BID   insulin aspart  0-5 Units Subcutaneous QHS   insulin aspart  0-9 Units Subcutaneous TID WC   ipratropium-albuterol  3 mL Nebulization Q6H   mirabegron ER  25 mg Oral Daily   rosuvastatin  20 mg Oral Daily   sodium chloride flush  3 mL Intravenous Q12H   tamsulosin  0.4 mg Oral QPC supper   Continuous Infusions:  sodium chloride     PRN Meds: sodium chloride, acetaminophen **OR** acetaminophen, iohexol, ondansetron **OR** ondansetron (ZOFRAN) IV, sodium chloride flush  Allergies:    Allergies  Allergen Reactions   Cefazolin Rash and Other (See Comments)    The patient had surgery and was given cefazolin intraop. ~ 10 days later he developed a rash confirmed by biopsy to be consistent w/ drug eruption. We cannot know for sure, but this is the most likely agent.     Social History:   Social History   Socioeconomic History   Marital status: Married    Spouse name: [name]    Number of children: 2   Years of education: College    Highest education level: Not on file  Occupational History    Comment: Self employed Administrator retired  Tobacco Use   Smoking status: Former    Packs/day: 0.75    Years: 40.00    Pack years: 30.00    Types: Cigarettes    Quit date: 07/20/2019    Years since quitting: 1.8   Smokeless tobacco: Never   Tobacco comments:    1/2 pack a day if that   Vaping Use   Vaping Use: Never used  Substance and Sexual Activity   Alcohol use: Yes    Comment: rare   Drug use: No   Sexual activity: Not Currently  Other Topics Concern   Not on file  Social History Narrative   Patient lives at  home with his wife. Rhonda    Patient has a Financial risk analyst.    Patient has 2  sons.    Patient smokes a half pack a day.          Social Determinants of Health   Financial Resource Strain: Not on file  Food Insecurity: Not on file  Transportation Needs: Not on file  Physical Activity: Not on file  Stress: Not on file  Social Connections: Not on file  Intimate Partner Violence: Not on file    Family History:    Family History  Problem Relation Age of Onset   Heart disease Mother        Onset ~77 y/o   Hypertension Mother        Deceased from old age at 78   Hyperlipidemia Mother    Arthritis Mother    Diabetes Father        Deceased from old age at 38   Heart attack Father    Heart disease Father        CABG at age 71   Dementia Father 6   Arthritis Father    Prostate cancer Maternal Grandfather    Colon cancer Neg Hx    Esophageal cancer Neg Hx    Rectal cancer Neg Hx    Stomach cancer Neg Hx     ROS:  Please see the history of present illness.   All other ROS reviewed and negative.     Physical Exam/Data:   Vitals:   06/01/21 0853 06/01/21 0855 06/01/21 1000 06/01/21 1038  BP:   126/74 (!) 96/58  Pulse:   75 85  Resp:   19 (!) 23  Temp:    97.9 F (36.6 C)  TempSrc:    Oral  SpO2: 92% 92% 100% 95%  Weight:      Height:        Intake/Output Summary (Last 24 hours) at 06/01/2021 1053 Last data filed at 06/01/2021 1044 Gross per 24 hour  Intake 1629.32 ml  Output 2050 ml  Net -420.68 ml   Last 3 Weights 06/01/2021 05/31/2021 05/30/2021  Weight (lbs) 209 lb 7 oz 218 lb 14.7 oz 219 lb 9.3 oz  Weight (kg) 95 kg 99.3 kg 99.6 kg     Body mass index is 33.8 kg/m.   General: Well developed, well nourished, NAD Neck: No JVD Lungs: Diminished in bilateral lower lobes with scattered wheezing. Breathing is unlabored. Cardiovascular: RRR with S1 S2. + harsh radiating murmur Abdomen: Soft, non-tender, non-distended. No obvious abdominal  masses. Extremities: No edema Neuro: Alert and oriented. No focal deficits. No facial asymmetry. MAE spontaneously. Psych: Responds to questions appropriately with normal affect.    EKG:  The EKG was personally reviewed and demonstrates: NSR with IVCD, HR 88bpm  Telemetry:  Telemetry was personally reviewed and demonstrates:  NSR with rates in the 70's   Relevant CV Studies:  [facility] 05/31/21:      Prox LAD lesion is 25% stenosed.   Dist Cx lesion is 40% stenosed.   Ost 2nd Mrg to 2nd Mrg lesion is 70% stenosed.   Dist RCA lesion is 60% stenosed.   Ost RPDA to RPDA lesion is 40% stenosed.   The LAD is a large caliber vessel that courses to the apex. There is mild mid LAD stenosis The Circumflex is a large caliber vessel that gives off a small obtuse marginal branch then 2 moderate caliber obtuse marginal branches. The second obtuse marginal branch has a moderate stenosis, unchanged from last cath in 2019.  The RCA is a large dominant artery. The distal RCA has  a moderate stenosis, unchanged from last cath in 2019.  Severe aortic stenosis (mean gradient 50.6 mmHg, 59 mmHg, AVA 0.93 cm2).    Recommendations: Will continue workup for TAVR. His right and left heart pressures are only mildly abnormal following inpatient diuresis. Continue diuresis. The structural heart team will provide a formal consultation regarding his candidacy for TAVR.    Echocardiogram 05/28/21:    1. Left ventricular ejection fraction, by estimation, is approximately  55%. The left ventricle has normal function. Left ventricular endocardial  border not optimally defined to evaluate regional wall motion. There is  severe concentric left ventricular  hypertrophy. Left ventricular diastolic parameters are consistent with  Grade I diastolic dysfunction (impaired relaxation). Elevated left  ventricular end-diastolic pressure.   2. Right ventricular systolic function is normal. The right ventricular  size is normal.  There is mildly elevated pulmonary artery systolic  pressure. The estimated right ventricular systolic pressure is 96.7 mmHg.   3. Left atrial size was mildly dilated.   4. Right atrial size was mildly dilated.   5. There is trivial pericardial effusion surrounding the apex.   6. The mitral valve is degenerative. Moderate mitral valve regurgitation  and very eccentric. The mean mitral valve gradient is 3.0 mmHg. Moderate  mitral annular calcification.   7. Tricuspid valve regurgitation is moderate.   8. The aortic valve is tricuspid. There is severe calcifcation of the  aortic valve. Aortic valve regurgitation is not visualized. Severe aortic  valve stenosis. Aortic valve mean gradient measures 56.5 mmHg.  Dimentionless index 0.19.   9. The inferior vena cava is dilated in size with <50% respiratory  variability, suggesting right atrial pressure of 15 mmHg.    Echocardiogram 01/30/2018:  - Left ventricle: The cavity size was normal. There was moderate    focal basal hypertrophy of the septum. Systolic function was    vigorous. The estimated ejection fraction was in the range of 65%    to 70%. Wall motion was normal; there were no regional wall    motion abnormalities. Doppler parameters are consistent with    abnormal left ventricular relaxation (grade 1 diastolic    dysfunction).  - Aortic valve: Trileaflet; severely thickened, severely calcified    leaflets. Valve mobility was restricted. There was moderate    stenosis. Peak velocity (S): 366 cm/s. Mean gradient (S): 32 mm    Hg. Peak gradient (S): 54 mm Hg. Valve area (VTI): 1.23 cm^2.    Valve area (Vmax): 1.4 cm^2. Valve area (Vmean): 1.37 cm^2.  - Mitral valve: There was trivial regurgitation.  - Pulmonary arteries: Systolic pressure was mildly increased. PA    peak pressure: 33 mm Hg (S).   R/LHC 01/24/2018:   Conclusions: Stable appearance of coronary arteries since 09/2016.  Moderate to severe disease involving the distal  RCA/rPDA and OM2 is not significantly changed and involves relatively small vessels.  Recent troponin elevation in [place] was likely due to supply-demand mismatch in the setting of heat stroke/hyperthermia, moderate aortic stenosis, and moderate to severe CAD involving small branches. Upper normal to mildly elevated left and right heart filling pressures. Normal Fick cardiac output/index. Moderate aortic stenosis.   Recommendations: Continue medical therapy.  Given minimal symptoms and stable disease since prior catheterization in 09/2016, I do not recommend PCI to small to moderate sized OM2 and rPDA branches at this time. Obtain outpatient echo to reassess aortic valve, given discrepant findings between recent outside echo (severe AS) and today's catheterization. Patient wishes to follow-up  with Dr. Geraldo Pitter in [place]; we will arrange for appointment in ~2 weeks.  If his symptoms remain stable or improve and echo confirms non-critical aortic stenosis, I think it is reasonable for Kyle Lewis to begin driving again.   Recommend continuation of clopidogrel 75 mg daily for moderate CAD and history of AAA status post endovascular repair.  Laboratory Data:  High Sensitivity Troponin:   Recent Labs  Lab 05/27/21 1420 05/27/21 1904  TROPONINIHS 58* 54*     Chemistry Recent Labs  Lab 05/30/21 0705 05/31/21 0419 05/31/21 1320 05/31/21 1323 06/01/21 0824  NA 141 139 138 137 136  K 4.3 4.8 4.2 4.5 4.4  CL 91* 87*  --   --  86*  CO2 44* 45*  --   --  43*  GLUCOSE 90 99  --   --  138*  BUN 22 32*  --   --  33*  CREATININE 0.73 0.84  --   --  0.85  CALCIUM 8.3* 8.5*  --   --  8.5*  GFRNONAA >60 >60  --   --  >60  ANIONGAP 6 7  --   --  7    Recent Labs  Lab 05/27/21 1420 05/28/21 0026  PROT 6.2* 5.5*  ALBUMIN 3.7 3.3*  AST 19 22  ALT 31 29  ALKPHOS 56 48  BILITOT 0.8 1.1   Hematology Recent Labs  Lab 05/27/21 1420 05/28/21 0026 05/31/21 1320 05/31/21 1323  WBC 7.7 5.8   --   --   RBC 5.20 4.94  --   --   HGB 16.6 15.7 16.3 17.0  HCT 52.8* 48.4 48.0 50.0  MCV 101.5* 98.0  --   --   MCH 31.9 31.8  --   --   MCHC 31.4 32.4  --   --   RDW 14.5 14.2  --   --   PLT 186 167  --   --    BNP Recent Labs  Lab 05/27/21 1420  BNP 1,348.7*    DDimer No results for input(s): DDIMER in the last 168 hours.   Radiology/Studies:  CARDIAC CATHETERIZATION  Result Date: 05/31/2021   Prox LAD lesion is 25% stenosed.   Dist Cx lesion is 40% stenosed.   Ost 2nd Mrg to 2nd Mrg lesion is 70% stenosed.   Dist RCA lesion is 60% stenosed.   Ost RPDA to RPDA lesion is 40% stenosed. The LAD is a large caliber vessel that courses to the apex. There is mild mid LAD stenosis The Circumflex is a large caliber vessel that gives off a small obtuse marginal branch then 2 moderate caliber obtuse marginal branches. The second obtuse marginal branch has a moderate stenosis, unchanged from last cath in 2019. The RCA is a large dominant artery. The distal RCA has a moderate stenosis, unchanged from last cath in 2019. Severe aortic stenosis (mean gradient 50.6 mmHg, 59 mmHg, AVA 0.93 cm2). Recommendations: Will continue workup for TAVR. His right and left heart pressures are only mildly abnormal following inpatient diuresis. Continue diuresis. The structural heart team will provide a formal consultation regarding his candidacy for TAVR.     STS Risk Calculator: Procedure: AV Replacement  STS Risk Calculator: Procedure: AV Replacement  Risk of Mortality: 0.925% Renal Failure: 0.944% Permanent Stroke: 1.500% Prolonged Ventilation: 5.190% DSW Infection: 0.163% Reoperation: 3.370% Morbidity or Mortality: 8.417% Short Length of Stay: 49.081% Long Length of Stay: 2.416%  [facility] Cardiomyopathy Questionnaire  KCCQ-12 05/31/2021  1 a. Ability  to shower/bathe Quite a bit limited  1 b. Ability to walk 1 block Other, Did not do  1 c. Ability to hurry/jog Other, Did not do  2.  Edema feet/ankles/legs Less than once a week  3. Limited by fatigue All of the time  4. Limited by dyspnea All of the time  5. Sitting up / on 3+ pillows 1-2 times a week  6. Limited enjoyment of life Limited quite a bit  7. Rest of life w/ symptoms Mostly dissatisfied  8 a. Participation in hobbies Severely limited  8 b. Participation in chores Severely limited  8 c. Visiting family/friends Moderately limited    Assessment and Plan:   MIACHEL NARDELLI is a 75 y.o. male with symptoms of severe, stage D1 aortic stenosis with NYHA Class III symptoms. I have reviewed the patient's recent echocardiogram which is notable for normal LV systolic function and severe aortic stenosis with peak gradient of 93.79mmHg and mean transvalvular gradient of 56.21mmHg. The patient's dimensionless index is 0.19 and calculated aortic valve area is 0.61 cm.    I have reviewed the natural history of aortic stenosis with the patient. We have discussed the limitations of medical therapy and the poor prognosis associated with symptomatic aortic stenosis. We have reviewed potential treatment options, including palliative medical therapy, conventional surgical aortic valve replacement, and transcatheter aortic valve replacement. We discussed treatment options in the context of this patient's specific comorbid medical conditions.    The patient's predicted risk of mortality with conventional aortic valve replacement is 0.925% primarily based on acute diastolic CHF, CAD, carotid artery stenosis and ongoing tobacco use. Other significant comorbid conditions include sleep apnea, memory issues, obesity, and sedentary lifestyle. TAVR seems like a reasonable treatment option for this patient pending formal cardiac surgical consultation. We discussed typical evaluation which will require a gated cardiac CTA and a CTA of the chest/abdomen/pelvis to evaluate both his cardiac anatomy and peripheral vasculature which is scheduled for today,  06/01/21. He has not been seen by a dentist in many years therefore will obtain dental pantogram as well. He was previously followed by Dr. Oneida Alar for carotid artery disease with known total occlusion of the RICA and 67-20% of the LICA. Given this, will obtain doppler studies with other imaging.   For questions or updates, please contact [place] Please consult [email or web address] for contact info under    Signed, Kathyrn Drown, NP  06/01/2021 10:53 AM    Chart reviewed, patient examined, agree with above. This 75 year old gentleman has stage D, severe, symptomatic aortic stenosis with New York Heart Association class III symptoms of exertional fatigue and shortness of breath with a severely calcified trileaflet aortic valve with a mean gradient of 56.5 mmHg and a dimensionless index of 0.19.  Left ventricular ejection fraction is 55%.  There is moderate mitral regurgitation.  Cardiac catheterization shows moderate nonobstructive coronary disease with a mean gradient across aortic valve of 50.6 mmHg and a peak gradient of 59 mmHg.  I agree that aortic valve replacement is indicated in this patient for relief of his progressive symptoms of congestive heart failure.  Given his comorbid risk factors of restricted mobility, morbid obesity, COPD with ongoing smoking, cerebrovascular disease with an occluded right internal carotid artery and 40 to 60% left internal carotid artery I do not think I would consider him a candidate for open surgical aortic valve replacement at 75 years old.  I agree that transcatheter aortic valve replacement would be a reasonable alternative for treating  him.  His gated cardiac CTA shows anatomy suitable for TAVR using a SAPIEN 3 valve although he has a shallow left main height of 9.2 mm which could potentially result in left main coronary obstruction.  His abdominal and pelvic CTA shows extensive aortoiliac vascular disease with prior aortobifemoral stent grafting for abdominal  aortic aneurysm.  We will need to review his scans with the team to decide on the best approach.  He does appear to have adequate left subclavian artery size to allow insertion.  He has an extensive rash over the upper chest bilaterally which he says just started since hospitalization and is located where a subclavicular incision would be placed.  I would not recommend using his left carotid artery since he has an occluded right internal carotid artery.  His carotid Doppler examination is essentially unchanged from his prior study with occlusion of the right internal carotid artery and 40 to 60% stenosis of the left internal carotid artery.  Vertebral flow is antegrade bilaterally.  He also had an orthopantogram which showed no signs of dental abscess.  The patient was counseled at length regarding treatment alternatives for management of severe symptomatic aortic stenosis. The risks and benefits of surgical intervention has been discussed in detail. Long-term prognosis with medical therapy was discussed. Alternative approaches such as conventional surgical aortic valve replacement, transcatheter aortic valve replacement, and palliative medical therapy were compared and contrasted at length. This discussion was placed in the context of the patient's own specific clinical presentation and past medical history. All of his questions have been addressed.   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  I do not think he is a candidate for emergent sternotomy to manage any intraoperative complications.  The patient is aware of the fact that transient use of  cardiopulmonary bypass may be necessary. The patient has been advised of a variety of complications that might develop including but not limited to risks of death, stroke, paravalvular leak, aortic dissection or other major vascular complications, aortic annulus rupture, device embolization, cardiac rupture or perforation, mitral regurgitation, acute myocardial infarction, arrhythmia, heart block or bradycardia requiring permanent pacemaker placement, congestive heart failure, respiratory failure, renal failure, pneumonia, infection, other late complications related to structural valve deterioration or migration, or other complications that might ultimately cause a temporary or permanent loss of functional independence or other long term morbidity. The patient provides full informed consent for the procedure as described and all questions were answered.

## 2021-06-01 NOTE — H&P (View-Only) (Signed)
HEART AND [place] VALVE TEAM  Cardiology Consultation:   Patient ID: GUTHRIE LEMME MRN: [id number]; DOB: [birth year]  Admit date: 05/27/2021 Date of Consult: 06/01/2021  Primary Care Provider: [name], MD [facility] Cardiologist: Jenean Lindau, MD   Patient Profile:   Kyle Lewis is a 75 y.o. male with a hx of medically managed CAD per cardiac catheterization 2019, AAA s/p repair 2016, previously moderate aortic stenosis>>now severe per echocardiogram, carotid artery disease with known RICA occlusion and 09-32% LICA disease from 3557, OSA on CPAP, hx of TIA 2013, COPD, ongoing tobacco use and HTN who is being seen today for the evaluation of severe aortic stenosis at the request of Dr. Angelena Form.  History of Present Illness:   Kyle Lewis lives at home with his wife in [place], [place]. They have two adult children who live nearby. HPI somewhat difficult to obtain due to patients poor awareness of the situation and sleepiness during our conversation. He reports that he is fairly sedentary at baseline this was confirmed after speaking with his wife via telephone. He spends most of his day watching television. He continues to drive and go to the grocery store however uses a motorized scooter at stores and a cane all other times for ambulation. He can walk up stairs and around his home but does feel significant dyspnea. He takes frequent breaks with ADL/IADL activities due to dyspnea. He thinks he was doing well about a year ago but has seen a decline in his functional ability since that time. He continues to smoke about a half pack a day but does not need home O2. Wife reports some mild dementia although he is not currently taking medication for this.    [name] was referred to Dr. Oneida Alar after MRI performed for back pain showed 4.8cm abdominal aortic aneurysm. Repeat study showed increased growth to 5.3cm at which time surgical repair was  recommended however the patient initially deferred. He was then referred to Dr. Saunders Revel by 2018 for risk stratification prior to AAA repair. He was noted to be asymptomatic with the exception of exertional dyspnea, which prompted further testing with an echocardiogram and myocardial perfusion stress testing. He was found to have mild to moderate aortic stenosis at that time as well as an intermediate-risk stress test. Subsequent LHC 09/15/2016 revealed moderate severe but noncritical CAD involving mid PDA and OM2 without significant disease. Plan was to continue with medical management of his CAD and monitoring clinical symptoms of AS. He was placed on Plavix and Lipitor. He then underwent repeat LHC 01/24/2018 which showed moderate to severe disease involving distal RCA/RPDA and OM 2, moderate aortic stenosis. Echocardiogram on 01/30/2018 showed EF 65 to 70%, moderate AS (Vmax 3.7 m/s, mean gradient 32 mmHg, AVA 1.2 cm^2). Appears that he was lost to follow up since 2020 when he was last seen by Dr. Lennox Pippins. At that time, he continued to be asymptomatic and no changes were made to his regimen.    On 05/27/21, he presented to [facility] after a 1-2 week hx of worsening shortness of breath. He was seen by his PCP who prescribed a short course of prednisone and antibiotics for presumed COPD exacerbation. He was found to be hypoxic when he checked his O2 sats at home, prompting him to present to the ED. On ED presentation, SpO2 was at 94% on 4 L with stable vital signs. BNP was 1350, HsT at 58 > 54, WBC 7.7, Hb 16.6,  Platelets 186, COVID-19 negative, flu negative. CXR with mild cardiomegaly with small right pleural effusion. EKG with NSR.  Echocardiogram this admission showed normal LV systolic function however severe aortic stenosis with a mean gradient 59 mmHg, AVA 0.6 cm, and DI 0.18. It was felt that his acute CHF was driven by severe AS.    Plan was to proceed with [facility] performed 05/31/21 which showed mild LAD stenosis,  moderate dLCx disease, moderate RCA disease and moderate ostial RPDA to RPDA disease with confirmed severe AS with a mean gradient at 50.60mmHg, peak 8mmHg, and AVA 0.93cm2. Structural heart team was consulted for further evaluation and possible TAVR versus SAVR.   Today [name] has no specific complaints. I spoke at length to his wife yesterday afternoon regarding our plans. He has no chest pain, SOB, LE edema, palpitations or orthopnea symptoms.   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   Abscess of left leg    Acute kidney injury superimposed on chronic kidney disease ([place]) 01/10/2018   Adenomatous colon polyp    Adrenal mass (HCC)    2 benign appearing left adrenal adenomas noted on 01/13/15 CT   Allergy    seasonal   Aortic stenosis    mild to moderate by echo and cath in 09/2016   Arthritis    Carotid artery occlusion    Cataract    Chronic back pain    COPD (chronic obstructive pulmonary disease) (HCC)    Coronary artery disease    CVA (cerebral infarction)    Degeneration of cervical intervertebral disc    Diverticulosis    History of back surgery    Rods and Screws in back   History of left knee replacement    Hypertension    Meningioma (HCC)    Nocturia    Peripheral vascular disease ([place])    Ringing in ear    ([place])   Ruptured abdominal aortic aneurysm (AAA) 07/01/2015   Formatting of this note might be different from the original. Overview:  Last evaluation by ultrasounds with diameter of 5 cm follow-up by Dr. Oneida Alar last seen in June 10, 2015 Formatting of this note might be different from the original. Last evaluation by ultrasounds with diameter of 5 cm follow-up by Dr. Oneida Alar last seen in June 10, 2015   Sleep apnea    wears BIPAP set on 5 to 9   Stroke ([place]) 2013   tia no residual deficit from    Past Surgical History:  Procedure Laterality Date   ABDOMINAL AORTIC ANEURYSM REPAIR   2019   [place]  as child   BACK SURGERY  2018   Rods and Screws in Back lower back   BRAIN MENINGIOMA [place]     left   EYE SURGERY Bilateral    Cataract   IRRIGATION AND DEBRIDEMENT ABSCESS Left 08/25/2020   Procedure: IRRIGATION AND DEBRIDEMENT ABSCESS LEFT LEG;  Surgeon: Evelina Bucy, DPM;  Location: [facility];  Service: Podiatry;  Laterality: Left;   JOINT REPLACEMENT Left 04-01-12   Knee   LEFT HEART CATH AND CORONARY ANGIOGRAPHY N/A 09/15/2016   Procedure: Left Heart Cath and Coronary Angiography;  Surgeon: Nelva Bush, MD;  Location: [facility];  Service: Cardiovascular;  Laterality: N/A;   MAXIMUM ACCESS (MAS)POSTERIOR LUMBAR INTERBODY FUSION (PLIF) 1  LEVEL N/A 07/07/2015   Procedure:  POSTERIOR LUMBAR INTERBODY FUSION (PLIF) Lumbar Four-Five with Pedicle Screw Fixation Lumbar Two-Five;Laminectomy Lumbar Two-Five;  Surgeon: Eustace Moore, MD;  Location: [facility];  Service: Neurosurgery;  Laterality: N/A;   POSTERIOR LUMBAR INTERBODY FUSION (PLIF) Lumbar Four-Five with Pedicle Screw Fixation Lumbar Two-Five;Laminectomy Lumbar Two-Five   RIGHT/LEFT HEART CATH AND CORONARY ANGIOGRAPHY N/A 01/24/2018   Procedure: RIGHT/LEFT HEART CATH AND CORONARY ANGIOGRAPHY;  Surgeon: Nelva Bush, MD;  Location: [facility];  Service: Cardiovascular;  Laterality: N/A;   RIGHT/LEFT HEART CATH AND CORONARY ANGIOGRAPHY N/A 05/31/2021   Procedure: RIGHT/LEFT HEART CATH AND CORONARY ANGIOGRAPHY;  Surgeon: Burnell Blanks, MD;  Location: [facility];  Service: Cardiovascular;  Laterality: N/A;   TONSILLECTOMY  as child   TOTAL KNEE ARTHROPLASTY  04/01/2012   Procedure: TOTAL KNEE ARTHROPLASTY;  Surgeon: Gearlean Alf, MD;  Location: [facility];  Service: Orthopedics;  Laterality: Left;   TRANSURETHRAL RESECTION OF PROSTATE     UMBILICAL HERNIA REPAIR  2011    Inpatient  Medications: Scheduled Meds:  aspirin EC  81 mg Oral Daily   enoxaparin (LOVENOX) injection  40 mg Subcutaneous QHS   feeding supplement  237 mL Oral BID BM   finasteride  5 mg Oral Daily   fluticasone furoate-vilanterol  1 puff Inhalation Daily   furosemide  40 mg Intravenous BID   [START ON 06/02/2021] furosemide  40 mg Oral Daily   gabapentin  300 mg Oral BID   insulin aspart  0-5 Units Subcutaneous QHS   insulin aspart  0-9 Units Subcutaneous TID WC   ipratropium-albuterol  3 mL Nebulization Q6H   mirabegron ER  25 mg Oral Daily   rosuvastatin  20 mg Oral Daily   sodium chloride flush  3 mL Intravenous Q12H   tamsulosin  0.4 mg Oral QPC supper   Continuous Infusions:  sodium chloride     PRN Meds: sodium chloride, acetaminophen **OR** acetaminophen, iohexol, ondansetron **OR** ondansetron (ZOFRAN) IV, sodium chloride flush  Allergies:    Allergies  Allergen Reactions   Cefazolin Rash and Other (See Comments)    The patient had surgery and was given cefazolin intraop. ~ 10 days later he developed a rash confirmed by biopsy to be consistent w/ drug eruption. We cannot know for sure, but this is the most likely agent.     Social History:   Social History   Socioeconomic History   Marital status: Married    Spouse name: [name]    Number of children: 2   Years of education: College    Highest education level: Not on file  Occupational History    Comment: Self employed Administrator retired  Tobacco Use   Smoking status: Former    Packs/day: 0.75    Years: 40.00    Pack years: 30.00    Types: Cigarettes    Quit date: 07/20/2019    Years since quitting: 1.8   Smokeless tobacco: Never   Tobacco comments:    1/2 pack a day if that   Vaping Use   Vaping Use: Never used  Substance and Sexual Activity   Alcohol use: Yes    Comment: rare   Drug use: No   Sexual activity: Not Currently  Other Topics Concern   Not on file  Social History Narrative   Patient lives at  home with his wife. Rhonda    Patient has a Financial risk analyst.    Patient has 2  sons.    Patient smokes a half pack a day.          Social Determinants of Health   Financial Resource Strain: Not on file  Food Insecurity: Not on file  Transportation Needs: Not on file  Physical Activity: Not on file  Stress: Not on file  Social Connections: Not on file  Intimate Partner Violence: Not on file    Family History:    Family History  Problem Relation Age of Onset   Heart disease Mother        Onset ~108 y/o   Hypertension Mother        Deceased from old age at 64   Hyperlipidemia Mother    Arthritis Mother    Diabetes Father        Deceased from old age at 23   Heart attack Father    Heart disease Father        CABG at age 60   Dementia Father 47   Arthritis Father    Prostate cancer Maternal Grandfather    Colon cancer Neg Hx    Esophageal cancer Neg Hx    Rectal cancer Neg Hx    Stomach cancer Neg Hx     ROS:  Please see the history of present illness.   All other ROS reviewed and negative.     Physical Exam/Data:   Vitals:   06/01/21 0853 06/01/21 0855 06/01/21 1000 06/01/21 1038  BP:   126/74 (!) 96/58  Pulse:   75 85  Resp:   19 (!) 23  Temp:    97.9 F (36.6 C)  TempSrc:    Oral  SpO2: 92% 92% 100% 95%  Weight:      Height:        Intake/Output Summary (Last 24 hours) at 06/01/2021 1053 Last data filed at 06/01/2021 1044 Gross per 24 hour  Intake 1629.32 ml  Output 2050 ml  Net -420.68 ml   Last 3 Weights 06/01/2021 05/31/2021 05/30/2021  Weight (lbs) 209 lb 7 oz 218 lb 14.7 oz 219 lb 9.3 oz  Weight (kg) 95 kg 99.3 kg 99.6 kg     Body mass index is 33.8 kg/m.   General: Well developed, well nourished, NAD Neck: No JVD Lungs: Diminished in bilateral lower lobes with scattered wheezing. Breathing is unlabored. Cardiovascular: RRR with S1 S2. + harsh radiating murmur Abdomen: Soft, non-tender, non-distended. No obvious abdominal  masses. Extremities: No edema Neuro: Alert and oriented. No focal deficits. No facial asymmetry. MAE spontaneously. Psych: Responds to questions appropriately with normal affect.    EKG:  The EKG was personally reviewed and demonstrates: NSR with IVCD, HR 88bpm  Telemetry:  Telemetry was personally reviewed and demonstrates:  NSR with rates in the 70's   Relevant CV Studies:  [facility] 05/31/21:      Prox LAD lesion is 25% stenosed.   Dist Cx lesion is 40% stenosed.   Ost 2nd Mrg to 2nd Mrg lesion is 70% stenosed.   Dist RCA lesion is 60% stenosed.   Ost RPDA to RPDA lesion is 40% stenosed.   The LAD is a large caliber vessel that courses to the apex. There is mild mid LAD stenosis The Circumflex is a large caliber vessel that gives off a small obtuse marginal branch then 2 moderate caliber obtuse marginal branches. The second obtuse marginal branch has a moderate stenosis, unchanged from last cath in 2019.  The RCA is a large dominant artery. The distal RCA has  a moderate stenosis, unchanged from last cath in 2019.  Severe aortic stenosis (mean gradient 50.6 mmHg, 59 mmHg, AVA 0.93 cm2).    Recommendations: Will continue workup for TAVR. His right and left heart pressures are only mildly abnormal following inpatient diuresis. Continue diuresis. The structural heart team will provide a formal consultation regarding his candidacy for TAVR.    Echocardiogram 05/28/21:    1. Left ventricular ejection fraction, by estimation, is approximately  55%. The left ventricle has normal function. Left ventricular endocardial  border not optimally defined to evaluate regional wall motion. There is  severe concentric left ventricular  hypertrophy. Left ventricular diastolic parameters are consistent with  Grade I diastolic dysfunction (impaired relaxation). Elevated left  ventricular end-diastolic pressure.   2. Right ventricular systolic function is normal. The right ventricular  size is normal.  There is mildly elevated pulmonary artery systolic  pressure. The estimated right ventricular systolic pressure is 37.0 mmHg.   3. Left atrial size was mildly dilated.   4. Right atrial size was mildly dilated.   5. There is trivial pericardial effusion surrounding the apex.   6. The mitral valve is degenerative. Moderate mitral valve regurgitation  and very eccentric. The mean mitral valve gradient is 3.0 mmHg. Moderate  mitral annular calcification.   7. Tricuspid valve regurgitation is moderate.   8. The aortic valve is tricuspid. There is severe calcifcation of the  aortic valve. Aortic valve regurgitation is not visualized. Severe aortic  valve stenosis. Aortic valve mean gradient measures 56.5 mmHg.  Dimentionless index 0.19.   9. The inferior vena cava is dilated in size with <50% respiratory  variability, suggesting right atrial pressure of 15 mmHg.    Echocardiogram 01/30/2018:  - Left ventricle: The cavity size was normal. There was moderate    focal basal hypertrophy of the septum. Systolic function was    vigorous. The estimated ejection fraction was in the range of 65%    to 70%. Wall motion was normal; there were no regional wall    motion abnormalities. Doppler parameters are consistent with    abnormal left ventricular relaxation (grade 1 diastolic    dysfunction).  - Aortic valve: Trileaflet; severely thickened, severely calcified    leaflets. Valve mobility was restricted. There was moderate    stenosis. Peak velocity (S): 366 cm/s. Mean gradient (S): 32 mm    Hg. Peak gradient (S): 54 mm Hg. Valve area (VTI): 1.23 cm^2.    Valve area (Vmax): 1.4 cm^2. Valve area (Vmean): 1.37 cm^2.  - Mitral valve: There was trivial regurgitation.  - Pulmonary arteries: Systolic pressure was mildly increased. PA    peak pressure: 33 mm Hg (S).   R/LHC 01/24/2018:   Conclusions: Stable appearance of coronary arteries since 09/2016.  Moderate to severe disease involving the distal  RCA/rPDA and OM2 is not significantly changed and involves relatively small vessels.  Recent troponin elevation in [place] was likely due to supply-demand mismatch in the setting of heat stroke/hyperthermia, moderate aortic stenosis, and moderate to severe CAD involving small branches. Upper normal to mildly elevated left and right heart filling pressures. Normal Fick cardiac output/index. Moderate aortic stenosis.   Recommendations: Continue medical therapy.  Given minimal symptoms and stable disease since prior catheterization in 09/2016, I do not recommend PCI to small to moderate sized OM2 and rPDA branches at this time. Obtain outpatient echo to reassess aortic valve, given discrepant findings between recent outside echo (severe AS) and today's catheterization. Patient wishes to follow-up  with Dr. Geraldo Pitter in [place]; we will arrange for appointment in ~2 weeks.  If his symptoms remain stable or improve and echo confirms non-critical aortic stenosis, I think it is reasonable for Mr. Wickizer to begin driving again.   Recommend continuation of clopidogrel 75 mg daily for moderate CAD and history of AAA status post endovascular repair.  Laboratory Data:  High Sensitivity Troponin:   Recent Labs  Lab 05/27/21 1420 05/27/21 1904  TROPONINIHS 58* 54*     Chemistry Recent Labs  Lab 05/30/21 0705 05/31/21 0419 05/31/21 1320 05/31/21 1323 06/01/21 0824  NA 141 139 138 137 136  K 4.3 4.8 4.2 4.5 4.4  CL 91* 87*  --   --  86*  CO2 44* 45*  --   --  43*  GLUCOSE 90 99  --   --  138*  BUN 22 32*  --   --  33*  CREATININE 0.73 0.84  --   --  0.85  CALCIUM 8.3* 8.5*  --   --  8.5*  GFRNONAA >60 >60  --   --  >60  ANIONGAP 6 7  --   --  7    Recent Labs  Lab 05/27/21 1420 05/28/21 0026  PROT 6.2* 5.5*  ALBUMIN 3.7 3.3*  AST 19 22  ALT 31 29  ALKPHOS 56 48  BILITOT 0.8 1.1   Hematology Recent Labs  Lab 05/27/21 1420 05/28/21 0026 05/31/21 1320 05/31/21 1323  WBC 7.7 5.8   --   --   RBC 5.20 4.94  --   --   HGB 16.6 15.7 16.3 17.0  HCT 52.8* 48.4 48.0 50.0  MCV 101.5* 98.0  --   --   MCH 31.9 31.8  --   --   MCHC 31.4 32.4  --   --   RDW 14.5 14.2  --   --   PLT 186 167  --   --    BNP Recent Labs  Lab 05/27/21 1420  BNP 1,348.7*    DDimer No results for input(s): DDIMER in the last 168 hours.   Radiology/Studies:  CARDIAC CATHETERIZATION  Result Date: 05/31/2021   Prox LAD lesion is 25% stenosed.   Dist Cx lesion is 40% stenosed.   Ost 2nd Mrg to 2nd Mrg lesion is 70% stenosed.   Dist RCA lesion is 60% stenosed.   Ost RPDA to RPDA lesion is 40% stenosed. The LAD is a large caliber vessel that courses to the apex. There is mild mid LAD stenosis The Circumflex is a large caliber vessel that gives off a small obtuse marginal branch then 2 moderate caliber obtuse marginal branches. The second obtuse marginal branch has a moderate stenosis, unchanged from last cath in 2019. The RCA is a large dominant artery. The distal RCA has a moderate stenosis, unchanged from last cath in 2019. Severe aortic stenosis (mean gradient 50.6 mmHg, 59 mmHg, AVA 0.93 cm2). Recommendations: Will continue workup for TAVR. His right and left heart pressures are only mildly abnormal following inpatient diuresis. Continue diuresis. The structural heart team will provide a formal consultation regarding his candidacy for TAVR.     STS Risk Calculator: Procedure: AV Replacement  STS Risk Calculator: Procedure: AV Replacement  Risk of Mortality: 0.925% Renal Failure: 0.944% Permanent Stroke: 1.500% Prolonged Ventilation: 5.190% DSW Infection: 0.163% Reoperation: 3.370% Morbidity or Mortality: 8.417% Short Length of Stay: 49.081% Long Length of Stay: 2.416%  [facility] Cardiomyopathy Questionnaire  KCCQ-12 05/31/2021  1 a. Ability  to shower/bathe Quite a bit limited  1 b. Ability to walk 1 block Other, Did not do  1 c. Ability to hurry/jog Other, Did not do  2.  Edema feet/ankles/legs Less than once a week  3. Limited by fatigue All of the time  4. Limited by dyspnea All of the time  5. Sitting up / on 3+ pillows 1-2 times a week  6. Limited enjoyment of life Limited quite a bit  7. Rest of life w/ symptoms Mostly dissatisfied  8 a. Participation in hobbies Severely limited  8 b. Participation in chores Severely limited  8 c. Visiting family/friends Moderately limited    Assessment and Plan:   TRAVUS OREN is a 75 y.o. male with symptoms of severe, stage D1 aortic stenosis with NYHA Class III symptoms. I have reviewed the patient's recent echocardiogram which is notable for normal LV systolic function and severe aortic stenosis with peak gradient of 93.56mmHg and mean transvalvular gradient of 56.60mmHg. The patient's dimensionless index is 0.19 and calculated aortic valve area is 0.61 cm.    I have reviewed the natural history of aortic stenosis with the patient. We have discussed the limitations of medical therapy and the poor prognosis associated with symptomatic aortic stenosis. We have reviewed potential treatment options, including palliative medical therapy, conventional surgical aortic valve replacement, and transcatheter aortic valve replacement. We discussed treatment options in the context of this patient's specific comorbid medical conditions.    The patient's predicted risk of mortality with conventional aortic valve replacement is 0.925% primarily based on acute diastolic CHF, CAD, carotid artery stenosis and ongoing tobacco use. Other significant comorbid conditions include sleep apnea, memory issues, obesity, and sedentary lifestyle. TAVR seems like a reasonable treatment option for this patient pending formal cardiac surgical consultation. We discussed typical evaluation which will require a gated cardiac CTA and a CTA of the chest/abdomen/pelvis to evaluate both his cardiac anatomy and peripheral vasculature which is scheduled for today,  06/01/21. He has not been seen by a dentist in many years therefore will obtain dental pantogram as well. He was previously followed by Dr. Oneida Alar for carotid artery disease with known total occlusion of the RICA and 16-10% of the LICA. Given this, will obtain doppler studies with other imaging.   For questions or updates, please contact [place] Please consult [email or web address] for contact info under    Signed, Kathyrn Drown, NP  06/01/2021 10:53 AM    Chart reviewed, patient examined, agree with above. This 75 year old gentleman has stage D, severe, symptomatic aortic stenosis with New York Heart Association class III symptoms of exertional fatigue and shortness of breath with a severely calcified trileaflet aortic valve with a mean gradient of 56.5 mmHg and a dimensionless index of 0.19.  Left ventricular ejection fraction is 55%.  There is moderate mitral regurgitation.  Cardiac catheterization shows moderate nonobstructive coronary disease with a mean gradient across aortic valve of 50.6 mmHg and a peak gradient of 59 mmHg.  I agree that aortic valve replacement is indicated in this patient for relief of his progressive symptoms of congestive heart failure.  Given his comorbid risk factors of restricted mobility, morbid obesity, COPD with ongoing smoking, cerebrovascular disease with an occluded right internal carotid artery and 40 to 60% left internal carotid artery I do not think I would consider him a candidate for open surgical aortic valve replacement at 75 years old.  I agree that transcatheter aortic valve replacement would be a reasonable alternative for treating  him.  His gated cardiac CTA shows anatomy suitable for TAVR using a SAPIEN 3 valve although he has a shallow left main height of 9.2 mm which could potentially result in left main coronary obstruction.  His abdominal and pelvic CTA shows extensive aortoiliac vascular disease with prior aortobifemoral stent grafting for abdominal  aortic aneurysm.  We will need to review his scans with the team to decide on the best approach.  He does appear to have adequate left subclavian artery size to allow insertion.  He has an extensive rash over the upper chest bilaterally which he says just started since hospitalization and is located where a subclavicular incision would be placed.  I would not recommend using his left carotid artery since he has an occluded right internal carotid artery.  His carotid Doppler examination is essentially unchanged from his prior study with occlusion of the right internal carotid artery and 40 to 60% stenosis of the left internal carotid artery.  Vertebral flow is antegrade bilaterally.  He also had an orthopantogram which showed no signs of dental abscess.  The patient was counseled at length regarding treatment alternatives for management of severe symptomatic aortic stenosis. The risks and benefits of surgical intervention has been discussed in detail. Long-term prognosis with medical therapy was discussed. Alternative approaches such as conventional surgical aortic valve replacement, transcatheter aortic valve replacement, and palliative medical therapy were compared and contrasted at length. This discussion was placed in the context of the patient's own specific clinical presentation and past medical history. All of his questions have been addressed.   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  I do not think he is a candidate for emergent sternotomy to manage any intraoperative complications.  The patient is aware of the fact that transient use of  cardiopulmonary bypass may be necessary. The patient has been advised of a variety of complications that might develop including but not limited to risks of death, stroke, paravalvular leak, aortic dissection or other major vascular complications, aortic annulus rupture, device embolization, cardiac rupture or perforation, mitral regurgitation, acute myocardial infarction, arrhythmia, heart block or bradycardia requiring permanent pacemaker placement, congestive heart failure, respiratory failure, renal failure, pneumonia, infection, other late complications related to structural valve deterioration or migration, or other complications that might ultimately cause a temporary or permanent loss of functional independence or other long term morbidity. The patient provides full informed consent for the procedure as described and all questions were answered.

## 2021-06-01 NOTE — Progress Notes (Signed)
PROGRESS NOTE    Kyle Lewis  IRJ:[id number] DOB: 06/20/1945 DOA: 05/27/2021 PCP: [name], MD    Brief Narrative:  75 year old with history of COPD, obstructive sleep apnea on CPAP at home, hypertension presented to the ER with worsening shortness of breath for about 2 weeks.  Treated with steroids and antibiotics as outpatient.  On arrival to the ER, he was 94% on 4 L oxygen.  Blood pressure stable.  BNP 1348.  COVID and flu negative.  Admitted to hospital with acute on chronic diastolic heart failure secondary to severe aortic stenosis.   Assessment & Plan:   Principal Problem:   Acute CHF (congestive heart failure) (HCC) Active Problems:   OSA (obstructive sleep apnea)   Exogenous obesity   Tobacco use   Severe aortic stenosis   Acute respiratory failure with hypoxia and hypercapnia (HCC)   Acute on chronic diastolic heart failure (HCC)  Acute on chronic hypoxemic and hypercapnic respiratory failure, multifactorial.  Acute on chronic diastolic congestive heart failure, severe aortic stenosis.  Untreated sleep apnea. -Currently using oxygen in the daytime, BiPAP at night.  Patient does have BiPAP at home. Use oxygen to keep saturation more than 90%.  Consistently use BiPAP at night.  Patient will use his CPAP at night when he goes home. -Patient is diuresed with IV Lasix and did fairly well.  Urine output 1450 in the last 24 hours.  Changing to oral Lasix tomorrow.  Added Aldactone.  Severe aortic stenosis, symptomatic with shortness of breath, presyncope.  TAVR work-up on progress.  Getting CT scans.  Seen by structural heart team.  Smoker: Counseled to quit.   DVT prophylaxis: enoxaparin (LOVENOX) injection 40 mg Start: 06/01/21 2200 SCDs Start: 05/27/21 2310   Code Status: Full code Family Communication: None Disposition Plan: Status is: Inpatient  Remains inpatient appropriate because: Inpatient procedures planned         Consultants:   Cardiology  Procedures:  None  Antimicrobials:  None   Subjective: Patient seen and examined.  He was sleepy.  He woke up and denied any complaints.  He has not been mobilized yet.  We will consult PT OT.  He is using BiPAP at night.  Denies any chest pain or palpitations.  Objective: Vitals:   06/01/21 0853 06/01/21 0855 06/01/21 1000 06/01/21 1038  BP:   126/74 (!) 96/58  Pulse:   75 85  Resp:   19 (!) 23  Temp:    97.9 F (36.6 C)  TempSrc:    Oral  SpO2: 92% 92% 100% 95%  Weight:      Height:        Intake/Output Summary (Last 24 hours) at 06/01/2021 1403 Last data filed at 06/01/2021 1242 Gross per 24 hour  Intake 2049.32 ml  Output 2050 ml  Net -0.68 ml   Filed Weights   05/30/21 0500 05/31/21 0446 06/01/21 0000  Weight: 99.6 kg 99.3 kg 95 kg    Examination:  General: Chronically sick looking.  Flat affect.  Frail and debilitated.  Fairly comfortable on minimal oxygen. Cardiovascular: Fine crepitations at bases bilateral.  Respiratory effort is normal. Respiratory: S1-S2 normal.  Regular rate rhythm.  Trace bilateral pedal edema. Gastrointestinal: Soft.  Nondistended.  Bowel sound present. Ext: Trace bilateral pedal edema.  No cyanosis or deformity.   Data Reviewed: I have personally reviewed following labs and imaging studies  CBC: Recent Labs  Lab 05/27/21 1420 05/28/21 0026 05/31/21 1320 05/31/21 1323  WBC 7.7 5.8  --   --  NEUTROABS 6.1 5.3  --   --   HGB 16.6 15.7 16.3 17.0  HCT 52.8* 48.4 48.0 50.0  MCV 101.5* 98.0  --   --   PLT 186 167  --   --    Basic Metabolic Panel: Recent Labs  Lab 05/28/21 0026 05/29/21 0015 05/30/21 0705 05/31/21 0419 05/31/21 1320 05/31/21 1323 06/01/21 0824  NA 139 143 141 139 138 137 136  K 4.1 4.6 4.3 4.8 4.2 4.5 4.4  CL 97* 96* 91* 87*  --   --  86*  CO2 36* 42* 44* 45*  --   --  43*  GLUCOSE 210* 127* 90 99  --   --  138*  BUN 18 22 22  32*  --   --  33*  CREATININE 0.81   0.82 1.16 0.73 0.84   --   --  0.85  CALCIUM 7.9* 8.2* 8.3* 8.5*  --   --  8.5*  MG 2.0 2.3 2.2  --   --   --   --    GFR: Estimated Creatinine Clearance: 81 mL/min (by C-G formula based on SCr of 0.85 mg/dL). Liver Function Tests: Recent Labs  Lab 05/27/21 1420 05/28/21 0026  AST 19 22  ALT 31 29  ALKPHOS 56 48  BILITOT 0.8 1.1  PROT 6.2* 5.5*  ALBUMIN 3.7 3.3*   No results for input(s): LIPASE, AMYLASE in the last 168 hours. No results for input(s): AMMONIA in the last 168 hours. Coagulation Profile: No results for input(s): INR, PROTIME in the last 168 hours. Cardiac Enzymes: No results for input(s): CKTOTAL, CKMB, CKMBINDEX, TROPONINI in the last 168 hours. BNP (last 3 results) No results for input(s): PROBNP in the last 8760 hours. HbA1C: No results for input(s): HGBA1C in the last 72 hours. CBG: Recent Labs  Lab 05/31/21 1141 05/31/21 1649 05/31/21 2133 06/01/21 0602 06/01/21 1041  GLUCAP 200* 193* 140* 99 122*   Lipid Profile: Recent Labs    05/31/21 0419  CHOL 159  HDL 58  LDLCALC 88  TRIG 64  CHOLHDL 2.7   Thyroid Function Tests: No results for input(s): TSH, T4TOTAL, FREET4, T3FREE, THYROIDAB in the last 72 hours. Anemia Panel: No results for input(s): VITAMINB12, FOLATE, FERRITIN, TIBC, IRON, RETICCTPCT in the last 72 hours. Sepsis Labs: No results for input(s): PROCALCITON, LATICACIDVEN in the last 168 hours.  Recent Results (from the past 240 hour(s))  Resp Panel by RT-PCR (Flu A&B, Covid) Nasopharyngeal Swab     Status: None   Collection Time: 05/27/21  2:08 PM   Specimen: Nasopharyngeal Swab; Nasopharyngeal(NP) swabs in vial transport medium  Result Value Ref Range Status   SARS Coronavirus 2 by RT PCR NEGATIVE NEGATIVE Final    Comment: (NOTE) SARS-CoV-2 target nucleic acids are NOT DETECTED.  The SARS-CoV-2 RNA is generally detectable in upper respiratory specimens during the acute phase of infection. The lowest concentration of SARS-CoV-2 viral copies  this assay can detect is 138 copies/mL. A negative result does not preclude SARS-Cov-2 infection and should not be used as the sole basis for treatment or other patient management decisions. A negative result may occur with  improper specimen collection/handling, submission of specimen other than nasopharyngeal swab, presence of viral mutation(s) within the areas targeted by this assay, and inadequate number of viral copies(<138 copies/mL). A negative result must be combined with clinical observations, patient history, and epidemiological information. The expected result is Negative.  Fact Sheet for Patients:  EntrepreneurPulse.com.au  Fact Sheet for Healthcare  Providers:  IncredibleEmployment.be  This test is no t yet approved or cleared by the [place] and  has been authorized for detection and/or diagnosis of SARS-CoV-2 by FDA under an Emergency Use Authorization (EUA). This EUA will remain  in effect (meaning this test can be used) for the duration of the COVID-19 declaration under Section 564(b)(1) of the Act, 21 U.S.C.section 360bbb-3(b)(1), unless the authorization is terminated  or revoked sooner.       Influenza A by PCR NEGATIVE NEGATIVE Final   Influenza B by PCR NEGATIVE NEGATIVE Final    Comment: (NOTE) The Xpert Xpress SARS-CoV-2/FLU/RSV plus assay is intended as an aid in the diagnosis of influenza from Nasopharyngeal swab specimens and should not be used as a sole basis for treatment. Nasal washings and aspirates are unacceptable for Xpert Xpress SARS-CoV-2/FLU/RSV testing.  Fact Sheet for Patients: EntrepreneurPulse.com.au  Fact Sheet for Healthcare Providers: IncredibleEmployment.be  This test is not yet approved or cleared by the [place] FDA and has been authorized for detection and/or diagnosis of SARS-CoV-2 by FDA under an Emergency Use Authorization (EUA). This EUA  will remain in effect (meaning this test can be used) for the duration of the COVID-19 declaration under Section 564(b)(1) of the Act, 21 U.S.C. section 360bbb-3(b)(1), unless the authorization is terminated or revoked.  Performed at [facility], [place] [address], [place], [place] [address]          Radiology Studies: DG Orthopantogram  Result Date: 06/01/2021 CLINICAL DATA:  Poor dentition. EXAM: ORTHOPANTOGRAM/PANORAMIC COMPARISON:  None. FINDINGS: Patient motion blurs the central incisors. Dental fillings are present in the upper and lower molars bilaterally. No significant dental caries are present. No significant periapical lucencies are present. The mandible is intact and located. IMPRESSION: 1. Dental fillings in the upper and lower molars bilaterally. 2. No significant dental caries. Electronically Signed   By: San Morelle M.D.   On: 06/01/2021 11:46   CARDIAC CATHETERIZATION  Result Date: 05/31/2021   Prox LAD lesion is 25% stenosed.   Dist Cx lesion is 40% stenosed.   Ost 2nd Mrg to 2nd Mrg lesion is 70% stenosed.   Dist RCA lesion is 60% stenosed.   Ost RPDA to RPDA lesion is 40% stenosed. The LAD is a large caliber vessel that courses to the apex. There is mild mid LAD stenosis The Circumflex is a large caliber vessel that gives off a small obtuse marginal branch then 2 moderate caliber obtuse marginal branches. The second obtuse marginal branch has a moderate stenosis, unchanged from last cath in 2019. The RCA is a large dominant artery. The distal RCA has a moderate stenosis, unchanged from last cath in 2019. Severe aortic stenosis (mean gradient 50.6 mmHg, 59 mmHg, AVA 0.93 cm2). Recommendations: Will continue workup for TAVR. His right and left heart pressures are only mildly abnormal following inpatient diuresis. Continue diuresis. The structural heart team will provide a formal consultation regarding his candidacy for TAVR.   CT CORONARY MORPH  W/CTA COR W/SCORE W/CA W/CM &/OR WO/CM  Result Date: 06/01/2021 EXAM: OVER-READ INTERPRETATION  CT CHEST The following report is an over-read performed by radiologist Dr. Salvatore Marvel of [facility], [place] on 06/01/2021. This over-read does not include interpretation of cardiac or coronary anatomy or pathology. The coronary CTA interpretation by the cardiologist is attached. COMPARISON:  06/26/2017 chest CT angiogram. FINDINGS: Please see the separate concurrent chest CT angiogram report for details. IMPRESSION: Please see the separate concurrent chest CT angiogram report  for details. Electronically Signed   By: Ilona Sorrel M.D.   On: 06/01/2021 13:19        Scheduled Meds:  aspirin EC  81 mg Oral Daily   enoxaparin (LOVENOX) injection  40 mg Subcutaneous QHS   feeding supplement  237 mL Oral BID BM   finasteride  5 mg Oral Daily   fluticasone furoate-vilanterol  1 puff Inhalation Daily   furosemide  40 mg Intravenous BID   [START ON 06/02/2021] furosemide  40 mg Oral Daily   gabapentin  300 mg Oral BID   insulin aspart  0-5 Units Subcutaneous QHS   insulin aspart  0-9 Units Subcutaneous TID WC   ipratropium-albuterol  3 mL Nebulization Q6H   mirabegron ER  25 mg Oral Daily   rosuvastatin  20 mg Oral Daily   sodium chloride flush  3 mL Intravenous Q12H   tamsulosin  0.4 mg Oral QPC supper   Continuous Infusions:  sodium chloride       LOS: 5 days    Time spent: 30 minutes    Barb Merino, MD [facility] Pager [phone]

## 2021-06-01 NOTE — TOC Benefit Eligibility Note (Signed)
Patient Teacher, English as a foreign language completed.    The patient is currently admitted and upon discharge could be taking Farxiga 10 mg.  The current 30 day co-pay is, $530.22 due to a $472.88 deductible remaining.   The patient is currently admitted and upon discharge could be taking Jardiance 10 mg.  The current 30 day co-pay is, $497.69 due to a $472.88 deductible remaining.   The patient is insured through [place] [employer], [place] Patient Advocate Specialist [place] Patient Advocate Team Direct Number: [phone]  Fax: [phone]

## 2021-06-01 NOTE — Progress Notes (Signed)
Carotid artery duplex has been completed. Preliminary results can be found in CV Proc through chart review.   06/01/21 2:17 PM Kyle Lewis RVT

## 2021-06-01 NOTE — Evaluation (Signed)
Physical Therapy Evaluation & TAVR Pre-Assessment Patient Details Name: Kyle Lewis MRN: [id number] DOB: February 09, 1946 Today's Date: 06/01/2021  History of Present Illness  Pt is a 75 y.o. male admitted 05/27/21 with worsening SOB and hypoxia. Workup revealed severe AS; now undergoing TAVR workup. Course complicated by hypercardbic respiratory failure. PMH includes CAD, AAA s/p repair, CKD 3, AS, OSA on CPAP, COPD, HTN, CVA.   Clinical Impression  Pt is a 75 y.o.male being assessed for pre-TAVR.  Pt reports symptoms of fatigue and SOB with activity; PTA, pt typically household ambulator with SPC. Pt has WFL strength/ROM and fair balance. Pt ambulated 92 ft during the 6-minute walk test. 5-meter walk test produced an average gait speed of 1.36 ft/sec which indicates increased risk for falls. RPE was 8 and dyspnea was 8/10 during mobility. Pt was limited by fatigue and SOB. Pts frailty rating was 5/9 which is considered "mildly frail." Pt would benefit from continued PT in the acute care setting due to the above listed deficits in balance, strength, ROM, endurance and activity tolerance.    General UE/LE Strength and ROM:  Strength (0-5/5) ROM (limited/full)  R UE WFL WFL  L UE WFL WFL  R LE WFL WFL  L LE WFL WFL    6 Minute Walk Test:  Total Distance Walked:92 ft.    Did the pt need a rest break? Yes If yes, why? Pt reports limited by SOB and fatigue, declining further distance once sitting to rest   Pre-Test Post-Test  HR 75 84  BP 107/58 99/55  O2 saturations  67% on RA 91% on 5L O2 [place]  Modified Borg Dyspnea Scale (0 none-10 maximal) 2/10 6/10  RPE (6 very light-10 very hard) 6 8  Comments: Pt received with O2 [place] out of nose with SpO2 67% on RA  5 Meter Walk Test: Trial 1 12.71 seconds  Trial 2 11.16 seconds  Trial 3 12.33 seconds  3 Trial Average/Gait Speed 12.07 seconds/1.36 ft/sec (<1.8 ft/sec indicates high fall risk)   Clinical Frailty Scale (1 very fit - 9 terminally  ill): 5 (>/= 3/9 is considered frail)    Recommendations for follow up therapy are one component of a multi-disciplinary discharge planning process, led by the attending physician.  Recommendations may be updated based on patient status, additional functional criteria and insurance authorization.  Follow Up Recommendations Home health PT    Assistance Recommended at Discharge Intermittent Supervision/Assistance  Functional Status Assessment Patient has had a recent decline in their functional status and demonstrates the ability to make significant improvements in function in a reasonable and predictable amount of time.  Equipment Recommendations  None recommended by PT    Recommendations for Other Services       Precautions / Restrictions Precautions Precautions: Fall;Other (comment) Precaution Comments: Watch SpO2 (not on home O2) Restrictions Weight Bearing Restrictions: No      Mobility  Bed Mobility Overal bed mobility: Modified Independent                  Transfers Overall transfer level: Needs assistance Equipment used: Rollator (4 wheels) Transfers: Sit to/from Stand Sit to Stand: Min guard           General transfer comment: Cues to lock rollator brakes, reliant on momentum to power into standing, self-corrected hand placement; poor eccentric control into sitting    Ambulation/Gait Ambulation/Gait assistance: Min guard Gait Distance (Feet): 92 Feet Assistive device: Rollator (4 wheels) Gait Pattern/deviations: Step-through pattern;Decreased stride length;Trunk flexed  Gait velocity: Decreased     General Gait Details: Slow, fatigued gait with rollator and intermittent min guard for balance; pt declined futher distance secondary to fatigue  Stairs            Wheelchair Mobility    Modified Rankin (Stroke Patients Only)       Balance Overall balance assessment: Needs assistance   Sitting balance-Leahy Scale: Fair       Standing  balance-Leahy Scale: Fair Standing balance comment: can static stand without UE support, balance not challenged                             Pertinent Vitals/Pain Pain Assessment: No/denies pain    Home Living Family/patient expects to be discharged to:: Private residence Living Arrangements: Spouse/significant other Available Help at Discharge: Family;Available 24 hours/day Type of Home: House Home Access: Ramped entrance       Home Layout: One level Home Equipment: Cane - single Barista (2 wheels);Shower seat Additional Comments: Does not wear home O2    Prior Function Prior Level of Function : Independent/Modified Independent;Needs assist;Driving             Mobility Comments: Pt reports limited household ambulator with SPC; ambulation distance limited by fatigue/SOB, but pt managing household distances ok with various seated rest breaks ADLs Comments: Pt sits to shower, pt reports mod indep. Wife performs majority of cooking and household tasks     Journalist, newspaper        Extremity/Trunk Assessment   Upper Extremity Assessment Upper Extremity Assessment: Generalized weakness    Lower Extremity Assessment Lower Extremity Assessment: Generalized weakness       Communication      Cognition Arousal/Alertness: Awake/alert Behavior During Therapy: WFL for tasks assessed/performed Overall Cognitive Status: No family/caregiver present to determine baseline cognitive functioning                                 General Comments: Question decreased awareness, processing and short-term memory deficits        General Comments General comments (skin integrity, edema, etc.): Pt received with nasal canula outside of nostrils on face; SpO2 67% on RA; quick return to 91% when 5L O2 [place] replaced. Pre-TAVR assessment completed    Exercises     Assessment/Plan    PT Assessment Patient needs continued PT services  PT Problem List  Decreased strength;Decreased activity tolerance;Decreased balance;Decreased mobility;Cardiopulmonary status limiting activity       PT Treatment Interventions DME instruction;Gait training;Stair training;Functional mobility training;Therapeutic activities;Therapeutic exercise;Balance training;Patient/family education    PT Goals (Current goals can be found in the Care Plan section)  Acute Rehab PT Goals Patient Stated Goal: Return home PT Goal Formulation: With patient Time For Goal Achievement: 06/15/21 Potential to Achieve Goals: Good    Frequency Min 3X/week   Barriers to discharge        Co-evaluation               AM-PAC PT "6 Clicks" Mobility  Outcome Measure Help needed turning from your back to your side while in a flat bed without using bedrails?: A Little Help needed moving from lying on your back to sitting on the side of a flat bed without using bedrails?: A Little Help needed moving to and from a bed to a chair (including a wheelchair)?: A Little Help needed standing up from  a chair using your arms (e.g., wheelchair or bedside chair)?: A Little Help needed to walk in hospital room?: A Little Help needed climbing 3-5 steps with a railing? : A Little 6 Click Score: 18    End of Session Equipment Utilized During Treatment: Gait belt;Oxygen Activity Tolerance: Patient tolerated treatment well;Patient limited by fatigue Patient left: in bed;with call bell/phone within reach;with bed alarm set;Other (comment) (with vascular) Nurse Communication: Mobility status PT Visit Diagnosis: Other abnormalities of gait and mobility (R26.89);Muscle weakness (generalized) (M62.81)    Time: 1855-0158 PT Time Calculation (min) (ACUTE ONLY): 31 min   Charges:   PT Evaluation $PT Eval Moderate Complexity: [place], PT, DPT Acute Rehabilitation Services  Pager [phone] Office [place] 06/01/2021, 3:02 PM

## 2021-06-01 NOTE — Progress Notes (Signed)
Pt states he's not ready for sleep yet.  He will have RN call when he is ready for BIPAP.

## 2021-06-02 DIAGNOSIS — I340 Nonrheumatic mitral (valve) insufficiency: Secondary | ICD-10-CM

## 2021-06-02 DIAGNOSIS — I350 Nonrheumatic aortic (valve) stenosis: Secondary | ICD-10-CM | POA: Diagnosis not present

## 2021-06-02 DIAGNOSIS — J449 Chronic obstructive pulmonary disease, unspecified: Secondary | ICD-10-CM

## 2021-06-02 DIAGNOSIS — I5033 Acute on chronic diastolic (congestive) heart failure: Secondary | ICD-10-CM | POA: Diagnosis not present

## 2021-06-02 DIAGNOSIS — I509 Heart failure, unspecified: Secondary | ICD-10-CM | POA: Diagnosis not present

## 2021-06-02 DIAGNOSIS — N189 Chronic kidney disease, unspecified: Secondary | ICD-10-CM | POA: Diagnosis not present

## 2021-06-02 DIAGNOSIS — I714 Abdominal aortic aneurysm, without rupture, unspecified: Secondary | ICD-10-CM | POA: Diagnosis not present

## 2021-06-02 DIAGNOSIS — I739 Peripheral vascular disease, unspecified: Secondary | ICD-10-CM

## 2021-06-02 HISTORY — DX: Nonrheumatic mitral (valve) insufficiency: I34.0

## 2021-06-02 LAB — BASIC METABOLIC PANEL
Anion gap: 8 (ref 5–15)
BUN: 40 mg/dL — ABNORMAL HIGH (ref 8–23)
CO2: 40 mmol/L — ABNORMAL HIGH (ref 22–32)
Calcium: 8.3 mg/dL — ABNORMAL LOW (ref 8.9–10.3)
Chloride: 85 mmol/L — ABNORMAL LOW (ref 98–111)
Creatinine, Ser: 1.11 mg/dL (ref 0.61–1.24)
GFR, Estimated: >60 mL/min (ref >60)
Glucose, Bld: 92 mg/dL (ref 70–99)
Potassium: 4.7 mmol/L (ref 3.5–5.1)
Sodium: 133 mmol/L — ABNORMAL LOW (ref 135–145)

## 2021-06-02 LAB — GLUCOSE, CAPILLARY
Glucose-Capillary: 97 mg/dL (ref 70–99)
Glucose-Capillary: 109 mg/dL — ABNORMAL HIGH (ref 70–99)
Glucose-Capillary: 109 mg/dL — ABNORMAL HIGH (ref 70–99)
Glucose-Capillary: 107 mg/dL — ABNORMAL HIGH (ref 70–99)

## 2021-06-02 NOTE — Significant Event (Signed)
SATURATION QUALIFICATIONS: (This note is used to comply with regulatory documentation for home oxygen)  Patient Saturations on Room Air at Rest = 77%  Patient Saturations on Room Air while Ambulating = %  Patient Saturations on 4 Liters of oxygen while Ambulating = 92%  Please briefly explain why patient needs home oxygen:Desats at rest 70s 87-92 on 4l n/c

## 2021-06-02 NOTE — Significant Event (Signed)
Patient in chair with chair alarm does not want to go back to bed yet

## 2021-06-02 NOTE — Evaluation (Signed)
Occupational Therapy Evaluation Patient Details Name: Kyle Lewis MRN: [id number] DOB: 20-May-1946 Today's Date: 06/02/2021   History of Present Illness Pt is a 74 y.o. male admitted 05/27/21 with worsening SOB and hypoxia. Workup revealed severe AS; now undergoing TAVR workup. Course complicated by hypercardbic respiratory failure. PMH includes CAD, AAA s/p repair, CKD 3, AS, OSA on CPAP, COPD, HTN, CVA.   Clinical Impression   PTA, pt lives with spouse, typically ambulatory with cane and reports Modified Independence with all ADLs (aside from sock mgmt - wife assists). Pt presents now with decreased cardiopulmonary tolerance (new supplemental O2 needs), strength, standing balance and cognition. Pt overall min guard for short mobility using RW, Min A for UB ADL and Mod A for LB ADLs. Initiated energy conservation education, as well as fall prevention strategies. Will continue to follow acutely to maximize safety/independence with ADLs/mobility. Rec HHOT and frequent supervision at DC  Desats to 83% on 4 L O2, recovers to 91% within 1.5 min seated rest break.      Recommendations for follow up therapy are one component of a multi-disciplinary discharge planning process, led by the attending physician.  Recommendations may be updated based on patient status, additional functional criteria and insurance authorization.   Follow Up Recommendations  Home health OT    Assistance Recommended at Discharge Frequent or constant Supervision/Assistance  Functional Status Assessment  Patient has had a recent decline in their functional status and demonstrates the ability to make significant improvements in function in a reasonable and predictable amount of time.  Equipment Recommendations  None recommended by OT    Recommendations for Other Services       Precautions / Restrictions Precautions Precautions: Fall;Other (comment) Precaution Comments: Watch SpO2 (not on home  O2) Restrictions Weight Bearing Restrictions: No      Mobility Bed Mobility Overal bed mobility: Needs Assistance Bed Mobility: Supine to Sit     Supine to sit: Supervision;HOB elevated     General bed mobility comments: increased time/effort, use of bedrails and increased time to steady self EOB    Transfers Overall transfer level: Needs assistance Equipment used: Rolling walker (2 wheels) Transfers: Sit to/from Stand Sit to Stand: Min guard           General transfer comment: increased trunk flexion and increased time to rise      Balance Overall balance assessment: Needs assistance Sitting-balance support: No upper extremity supported;Feet supported Sitting balance-Leahy Scale: Fair     Standing balance support: Bilateral upper extremity supported;During functional activity Standing balance-Leahy Scale: Poor Standing balance comment: reliant on at least one UE support                           ADL either performed or assessed with clinical judgement   ADL Overall ADL's : Needs assistance/impaired Eating/Feeding: Independent;Sitting Eating/Feeding Details (indicate cue type and reason): able to open containers, prepare coffee Grooming: Min guard;Standing   Upper Body Bathing: Minimal assistance;Sitting Upper Body Bathing Details (indicate cue type and reason): to bathe back Lower Body Bathing: Moderate assistance;Sit to/from stand Lower Body Bathing Details (indicate cue type and reason): able to bathe anterior region, assist for posterior region and lower LEs Upper Body Dressing : Minimal assistance;Sitting   Lower Body Dressing: Moderate assistance;Sit to/from stand Lower Body Dressing Details (indicate cue type and reason): assist with donning socks (reports assist with this at baseline) Toilet Transfer: Min guard;Ambulation;Rolling walker (2 wheels)   Toileting- Clothing  Manipulation and Hygiene: Moderate assistance;Sit to/from stand        Functional mobility during ADLs: Min guard;Rolling walker (2 wheels) General ADL Comments: noted decreased awareness and knowledge of DME use increasing fall risk. New O2 needs and decreased cardiopulmonary tolerance with need for energy conservation education     Vision Ability to See in Adequate Light: 0 Adequate Patient Visual Report: No change from baseline Vision Assessment?: No apparent visual deficits     Perception     Praxis      Pertinent Vitals/Pain Pain Assessment: No/denies pain     Hand Dominance Right   Extremity/Trunk Assessment Upper Extremity Assessment Upper Extremity Assessment: Generalized weakness   Lower Extremity Assessment Lower Extremity Assessment: Defer to PT evaluation   Cervical / Trunk Assessment Cervical / Trunk Assessment: Normal   Communication Communication Communication: No difficulties   Cognition Arousal/Alertness: Awake/alert Behavior During Therapy: WFL for tasks assessed/performed Overall Cognitive Status: No family/caregiver present to determine baseline cognitive functioning                                 General Comments: noted per chart, wife reports pt with mild dementia. noted decreased awareness (had urinated in bed, aware of it but did not call out for assistance), cues for safety with DME use     General Comments  received after RN gotten report with [employer] of primo fit malfunction and urine in bed. pt reports he knew bed was wet but did not call out for assistance. Received on 4 L O2, 91% at rest, desat to 83% briefly with mobility and returned to low 90s within 1.5 min seated rest break    Exercises     Shoulder Instructions      Home Living Family/patient expects to be discharged to:: Private residence Living Arrangements: Spouse/significant other Available Help at Discharge: Family;Available 24 hours/day Type of Home: House Home Access: Ramped entrance     Home Layout: One level      Bathroom Shower/Tub: Hospital doctor Toilet: Handicapped height     Home Equipment: [place] - single Barista (2 wheels);Shower seat   Additional Comments: Does not wear home O2      Prior Functioning/Environment Prior Level of Function : Independent/Modified Independent;Needs assist;Driving             Mobility Comments: Pt reports limited household ambulator with SPC; ambulation distance limited by fatigue/SOB, but pt managing household distances ok with various seated rest breaks ADLs Comments: Pt sits to shower, pt reports mod indep with all ADLs aside from sock mgmt. Wife performs majority of cooking and household tasks        OT Problem List: Decreased strength;Decreased activity tolerance;Impaired balance (sitting and/or standing);Cardiopulmonary status limiting activity;Decreased knowledge of use of DME or AE;Decreased safety awareness;Decreased cognition      OT Treatment/Interventions: Self-care/ADL training;Therapeutic exercise;Energy conservation;DME and/or AE instruction;Therapeutic activities;Patient/family education;Balance training    OT Goals(Current goals can be found in the care plan section) Acute Rehab OT Goals Patient Stated Goal: feel better, have procedure and go home OT Goal Formulation: With patient Time For Goal Achievement: 06/16/21 Potential to Achieve Goals: Good  OT Frequency: Min 2X/week   Barriers to D/C:            Co-evaluation              AM-PAC OT "6 Clicks" Daily Activity     Outcome  Measure Help from another person eating meals?: None Help from another person taking care of personal grooming?: A Little Help from another person toileting, which includes using toliet, bedpan, or urinal?: A Lot Help from another person bathing (including washing, rinsing, drying)?: A Lot Help from another person to put on and taking off regular upper body clothing?: A Little Help from another person to put on and taking  off regular lower body clothing?: A Lot 6 Click Score: 16   End of Session Equipment Utilized During Treatment: Rolling walker (2 wheels);Oxygen Nurse Communication: Mobility status;Other (comment) (scrotal redness)  Activity Tolerance: Patient tolerated treatment well Patient left: in chair;with call bell/phone within reach;with chair alarm set  OT Visit Diagnosis: Unsteadiness on feet (R26.81);Other abnormalities of gait and mobility (R26.89);Muscle weakness (generalized) (M62.81)                Time: 6967-8938 OT Time Calculation (min): 25 min Charges:  OT General Charges $OT Visit: 1 Visit OT Evaluation $OT Eval Moderate Complexity: 1 Mod OT Treatments $Self Care/Home Management : 8-22 mins  Kyle Lewis, OTR/L Acute Rehab Services Office: [phone]   Layla Maw 06/02/2021, 7:54 AM

## 2021-06-02 NOTE — Progress Notes (Signed)
Mobility Specialist Progress Note:   06/02/21 1137  Mobility  Activity Ambulated in room  Level of Assistance Standby assist, set-up cues, supervision of patient - no hands on  Assistive Device Front wheel walker  Distance Ambulated (ft) 40 ft  Mobility Ambulated with assistance in room  Mobility Response Tolerated well  Mobility performed by Mobility specialist  Bed Position Chair  $Mobility charge 1 Mobility   Pt received in chair willing to participate in mobility. No complaints of pain. Pt ambulated to bathroom then declined further ambulation. Returned to chair with call bell in reach and all needs met.   [facility] Public librarian Phone [phone] Secondary Phone [phone]

## 2021-06-02 NOTE — Progress Notes (Signed)
Nurse asked NT to go put leads on pt per CCMD when NT came in room the bedside table was pushed by bathroom door leads were laying on the floor. NT asked pt why his leads were off he said I went to the bathroom when asked if he went by himself he stated yes but I need to be cleaned pt also had removed oxygen and asked for a cigarette

## 2021-06-02 NOTE — Progress Notes (Signed)
PROGRESS NOTE    Kyle BREARLEY  Lewis:[id number] DOB: 1945-12-01 DOA: 05/27/2021 PCP: [name], MD    Brief Narrative:  75 year old with history of COPD, obstructive sleep apnea on CPAP at home, hypertension presented to the ER with worsening shortness of breath for about 2 weeks.  Treated with steroids and antibiotics as outpatient.  On arrival to the ER, he was 94% on 4 L oxygen.  Blood pressure stable.  BNP 1348.  COVID and flu negative.  Admitted to hospital with acute on chronic diastolic heart failure secondary to severe aortic stenosis.   Assessment & Plan:   Principal Problem:   Acute CHF (congestive heart failure) (HCC) Active Problems:   OSA (obstructive sleep apnea)   Exogenous obesity   Tobacco use   Severe aortic stenosis   Acute respiratory failure with hypoxia and hypercapnia (HCC)   Acute on chronic diastolic heart failure (HCC)   Mitral regurgitation  Acute on chronic hypoxemic and hypercapnic respiratory failure, multifactorial.  Acute on chronic diastolic congestive heart failure, severe aortic stenosis.  Untreated sleep apnea. -Currently using oxygen in the daytime, BiPAP at night.  Patient does have BiPAP at home. Use oxygen to keep saturation more than 90%.  Consistently use BiPAP at night.  Patient will use his CPAP at night when he goes home. -Patient is diuresed with IV Lasix and did fairly well.  Urine output 2.7 L in the last 24 hours.  Changing to oral Lasix tomorrow.  Added Aldactone. Mobilize today.  Anticipate home tomorrow. Oxygen will be prescribed for home.  Severe aortic stenosis, symptomatic with shortness of breath, presyncope.  TAVR work-up on progress.   Scheduled for TAVR next week 06/07/2021.  Smoker: Counseled to quit.   DVT prophylaxis: enoxaparin (LOVENOX) injection 40 mg Start: 06/01/21 2200 SCDs Start: 05/27/21 2310   Code Status: Full code Family Communication: wife on the phone .  Disposition Plan: Status is:  Inpatient  Remains inpatient appropriate because: Inpatient procedures planned   Consultants:  Cardiology Pulmonary   Procedures:  None  Antimicrobials:  None   Subjective:  Patient seen and examined.  No overnight events.  Poor historian.  Feels much better.  Denies any shortness of breath.  Objective: Vitals:   06/02/21 0405 06/02/21 0841 06/02/21 0945 06/02/21 1000  BP: (!) 96/57   (!) 98/59  Pulse: 72   83  Resp: 17   16  Temp: 98.4 F (36.9 C)   98.7 F (37.1 C)  TempSrc:    Oral  SpO2: 96% 92% 95% 91%  Weight:      Height:        Intake/Output Summary (Last 24 hours) at 06/02/2021 1350 Last data filed at 06/02/2021 0900 Gross per 24 hour  Intake 1140 ml  Output 2150 ml  Net -1010 ml   Filed Weights   05/31/21 0446 06/01/21 0000 06/02/21 0006  Weight: 99.3 kg 95 kg 94.9 kg    Examination:  General: Chronically sick looking.  Flat affect.  Fairly comfortable on minimal oxygen. Cardiovascular: Fine crepitations at bases bilateral.  Respiratory effort is normal. Respiratory: S1-S2 normal.  Regular rate rhythm.  Trace bilateral pedal edema. Gastrointestinal: Soft.  Nondistended.  Bowel sound present. Ext: Trace bilateral pedal edema.  No cyanosis or deformity.   Data Reviewed: I have personally reviewed following labs and imaging studies  CBC: Recent Labs  Lab 05/27/21 1420 05/28/21 0026 05/31/21 1320 05/31/21 1323  WBC 7.7 5.8  --   --  NEUTROABS 6.1 5.3  --   --   HGB 16.6 15.7 16.3 17.0  HCT 52.8* 48.4 48.0 50.0  MCV 101.5* 98.0  --   --   PLT 186 167  --   --    Basic Metabolic Panel: Recent Labs  Lab 05/28/21 0026 05/29/21 0015 05/30/21 0705 05/31/21 0419 05/31/21 1320 05/31/21 1323 06/01/21 0824 06/02/21 0351  NA 139 143 141 139 138 137 136 133*  K 4.1 4.6 4.3 4.8 4.2 4.5 4.4 4.7  CL 97* 96* 91* 87*  --   --  86* 85*  CO2 36* 42* 44* 45*  --   --  43* 40*  GLUCOSE 210* 127* 90 99  --   --  138* 92  BUN 18 22 22  32*  --    --  33* 40*  CREATININE 0.81   0.82 1.16 0.73 0.84  --   --  0.85 1.11  CALCIUM 7.9* 8.2* 8.3* 8.5*  --   --  8.5* 8.3*  MG 2.0 2.3 2.2  --   --   --   --   --    GFR: Estimated Creatinine Clearance: 62 mL/min (by C-G formula based on SCr of 1.11 mg/dL). Liver Function Tests: Recent Labs  Lab 05/27/21 1420 05/28/21 0026  AST 19 22  ALT 31 29  ALKPHOS 56 48  BILITOT 0.8 1.1  PROT 6.2* 5.5*  ALBUMIN 3.7 3.3*   No results for input(s): LIPASE, AMYLASE in the last 168 hours. No results for input(s): AMMONIA in the last 168 hours. Coagulation Profile: No results for input(s): INR, PROTIME in the last 168 hours. Cardiac Enzymes: No results for input(s): CKTOTAL, CKMB, CKMBINDEX, TROPONINI in the last 168 hours. BNP (last 3 results) No results for input(s): PROBNP in the last 8760 hours. HbA1C: No results for input(s): HGBA1C in the last 72 hours. CBG: Recent Labs  Lab 06/01/21 1041 06/01/21 1602 06/01/21 2110 06/02/21 0556 06/02/21 1128  GLUCAP 122* 121* 132* 97 109*   Lipid Profile: Recent Labs    05/31/21 0419  CHOL 159  HDL 58  LDLCALC 88  TRIG 64  CHOLHDL 2.7   Thyroid Function Tests: No results for input(s): TSH, T4TOTAL, FREET4, T3FREE, THYROIDAB in the last 72 hours. Anemia Panel: No results for input(s): VITAMINB12, FOLATE, FERRITIN, TIBC, IRON, RETICCTPCT in the last 72 hours. Sepsis Labs: No results for input(s): PROCALCITON, LATICACIDVEN in the last 168 hours.  Recent Results (from the past 240 hour(s))  Resp Panel by RT-PCR (Flu A&B, Covid) Nasopharyngeal Swab     Status: None   Collection Time: 05/27/21  2:08 PM   Specimen: Nasopharyngeal Swab; Nasopharyngeal(NP) swabs in vial transport medium  Result Value Ref Range Status   SARS Coronavirus 2 by RT PCR NEGATIVE NEGATIVE Final    Comment: (NOTE) SARS-CoV-2 target nucleic acids are NOT DETECTED.  The SARS-CoV-2 RNA is generally detectable in upper respiratory specimens during the acute phase of  infection. The lowest concentration of SARS-CoV-2 viral copies this assay can detect is 138 copies/mL. A negative result does not preclude SARS-Cov-2 infection and should not be used as the sole basis for treatment or other patient management decisions. A negative result may occur with  improper specimen collection/handling, submission of specimen other than nasopharyngeal swab, presence of viral mutation(s) within the areas targeted by this assay, and inadequate number of viral copies(<138 copies/mL). A negative result must be combined with clinical observations, patient history, and epidemiological information. The expected result is  Negative.  Fact Sheet for Patients:  EntrepreneurPulse.com.au  Fact Sheet for Healthcare Providers:  IncredibleEmployment.be  This test is no t yet approved or cleared by the [place] FDA and  has been authorized for detection and/or diagnosis of SARS-CoV-2 by FDA under an Emergency Use Authorization (EUA). This EUA will remain  in effect (meaning this test can be used) for the duration of the COVID-19 declaration under Section 564(b)(1) of the Act, 21 U.S.C.section 360bbb-3(b)(1), unless the authorization is terminated  or revoked sooner.       Influenza A by PCR NEGATIVE NEGATIVE Final   Influenza B by PCR NEGATIVE NEGATIVE Final    Comment: (NOTE) The Xpert Xpress SARS-CoV-2/FLU/RSV plus assay is intended as an aid in the diagnosis of influenza from Nasopharyngeal swab specimens and should not be used as a sole basis for treatment. Nasal washings and aspirates are unacceptable for Xpert Xpress SARS-CoV-2/FLU/RSV testing.  Fact Sheet for Patients: EntrepreneurPulse.com.au  Fact Sheet for Healthcare Providers: IncredibleEmployment.be  This test is not yet approved or cleared by the [place] FDA and has been authorized for detection and/or diagnosis of SARS-CoV-2  by FDA under an Emergency Use Authorization (EUA). This EUA will remain in effect (meaning this test can be used) for the duration of the COVID-19 declaration under Section 564(b)(1) of the Act, 21 U.S.C. section 360bbb-3(b)(1), unless the authorization is terminated or revoked.  Performed at [facility], [place] [address] [place], [place] [address]          Radiology Studies: DG Orthopantogram  Result Date: 06/01/2021 CLINICAL DATA:  Poor dentition. EXAM: ORTHOPANTOGRAM/PANORAMIC COMPARISON:  None. FINDINGS: Patient motion blurs the central incisors. Dental fillings are present in the upper and lower molars bilaterally. No significant dental caries are present. No significant periapical lucencies are present. The mandible is intact and located. IMPRESSION: 1. Dental fillings in the upper and lower molars bilaterally. 2. No significant dental caries. Electronically Signed   By: San Morelle M.D.   On: 06/01/2021 11:46   CT CORONARY MORPH W/CTA COR W/SCORE W/CA W/CM &/OR WO/CM  Addendum Date: 06/01/2021   ADDENDUM REPORT: 06/01/2021 22:12 CLINICAL DATA:  Severe Aortic Stenosis. EXAM: Cardiac TAVR CT TECHNIQUE: A non-contrast, gated CT scan was obtained with axial slices of 3 mm through the heart for aortic valve calcium scoring. A 110 kV retrospective, gated, contrast cardiac scan was obtained. Gantry rotation speed was 250 msecs and collimation was 0.6 mm. Nitroglycerin was not given. The 3D data set was reconstructed in 5% intervals of the 0-95% of the R-R cycle. Systolic and diastolic phases were analyzed on a dedicated workstation using MPR, MIP, and VRT modes. The patient received 100 cc of contrast. FINDINGS: Image quality: Poor. The contrast remained in the SVC and the study had poor contrast opacification. Noise artifact is: Moderate. Valve Morphology: Tricuspid aortic valve that is severely calcified. There is bulky calcification of the Plum. Aortic Valve  Calcium score: 2634 Aortic annular dimension: Phase assessed: 25% Annular area: 574 mm2 Annular perimeter: 86.2 mm Max diameter: 30.1 mm Min diameter: 24.6 mm Membranous septum length: 13.4 mm Annular and subannular calcification: None. Optimal coplanar projection: RAO 3 CAU 15 Coronary Artery Height above Annulus: Left Main: 9.2 mm Right Coronary: 16.0 mm Sinus of Valsalva Measurements: Non-coronary: 33.5 mm Right-coronary: 33.6 mm Left-coronary: 34.5 mm Sinus of Valsalva Height: Non-coronary: 25.3 mm Right-coronary: 23.9 mm Left-coronary: 19.5 mm Sinotubular Junction: 28 mm Ascending Thoracic Aorta: 35 mm Coronary Arteries: Normal coronary origin. Right  dominance. The study was performed without use of NTG and is insufficient for plaque evaluation. Please refer to recent cardiac catheterization for coronary assessment. 3-vessel coronary calcifications noted. Cardiac Morphology: Right Atrium: Right atrial size is within normal limits. Contrast reflux into the RA consistent with elevated RA pressure. Right Ventricle: The right ventricular cavity is within normal limits. Left Atrium: Left atrial size is normal in size with no left atrial appendage filling defect. Left Ventricle: The ventricular cavity size is within normal limits. There are no stigmata of prior infarction. There is no abnormal filling defect. Normal left ventricular function, LVEF=56%. No regional wall motion abnormalities. Pulmonary arteries: Dilated suggestive of pulmonary hypertension. Pulmonary veins: Normal pulmonary venous drainage. Pericardium: Normal thickness with no significant effusion or calcium present. Mitral Valve: The mitral valve is normal structure without significant calcification. Extra-cardiac findings: See attached radiology report for non-cardiac structures. IMPRESSION: 1. 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 2. Annular measurements support a 29 mm S3 TAVR (574 mm2). 3. No significant annular or subannular  calcifications. 4. Shallow left main height concerning for increased risk for coronary obstruction (9.2 mm). 5. Optimal Fluoroscopic Angle for Delivery: RAO 3 CAU 15 6. Dilated pulmonary artery suggestive of pulmonary hypertension. [place] T. Audie Box, MD Electronically Signed   By: Eleonore Chiquito M.D.   On: 06/01/2021 22:12   Result Date: 06/01/2021 EXAM: OVER-READ INTERPRETATION  CT CHEST The following report is an over-read performed by radiologist Dr. Salvatore Marvel of [facility], [place] on 06/01/2021. This over-read does not include interpretation of cardiac or coronary anatomy or pathology. The coronary CTA interpretation by the cardiologist is attached. COMPARISON:  06/26/2017 chest CT angiogram. FINDINGS: Please see the separate concurrent chest CT angiogram report for details. IMPRESSION: Please see the separate concurrent chest CT angiogram report for details. Electronically Signed: By: Ilona Sorrel M.D. On: 06/01/2021 13:19   CT ANGIO CHEST AORTA W/CM & OR WO/CM  Result Date: 06/01/2021 CLINICAL DATA:  Inpatient. 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 Aortic stenosis. EXAM: CT ANGIOGRAPHY CHEST, ABDOMEN AND PELVIS TECHNIQUE: Multidetector CT imaging through the chest, abdomen and pelvis was performed using the standard protocol during bolus administration of intravenous contrast. Multiplanar reconstructed images and MIPs were obtained and reviewed to evaluate the vascular anatomy. CONTRAST:  145mL OMNIPAQUE IOHEXOL 350 MG/ML SOLN COMPARISON:  06/26/2017 CT angiogram of the chest, abdomen and pelvis. 05/12/2018 unenhanced CT abdomen/pelvis. 10/25/2018 MRI abdomen. FINDINGS: CTA CHEST FINDINGS Cardiovascular: Borderline mild cardiomegaly. No significant pericardial effusion/thickening. Diffuse thickening and coarse calcification of the aortic valve. Three-vessel coronary atherosclerosis. Atherosclerotic nonaneurysmal thoracic aorta. Top-normal caliber main pulmonary artery (3.1 cm diameter).  No central pulmonary emboli. Mediastinum/Nodes: No discrete thyroid nodules. Unremarkable esophagus. No pathologically enlarged axillary, mediastinal or hilar lymph nodes. Lungs/Pleura: No pneumothorax. Small dependent bilateral pleural effusions. Severe centrilobular emphysema with diffuse bronchial wall thickening. Saber sheath trachea. Right upper lobe 0.3 cm solid pulmonary nodule (series 8/image 64), stable since 06/26/2017 chest CT. Mild-to-moderate dependent bilateral lower lobe atelectasis. No lung masses or additional significant pulmonary nodules in the aerated portions of the lungs. Musculoskeletal: No aggressive appearing focal osseous lesions. Mild thoracic spondylosis. Marked degenerative disc disease throughout the cervical spine. CTA ABDOMEN AND PELVIS FINDINGS Hepatobiliary: Normal liver size. Several scattered subcentimeter simple liver cysts throughout the liver, not appreciably changed from 10/25/2018 MRI abdomen. No appreciable new liver lesions. Cholelithiasis. No biliary ductal dilatation. Pancreas: Normal, with no mass or duct dilation. Spleen: Normal size. No mass. Adrenals/Urinary Tract: Normal right adrenal. Left  adrenal 2.3 cm nodule with density 44 HU, stable since at least 05/12/2018 unenhanced CT abdomen study, compatible with an adenoma. Hypodense exophytic 1.6 cm anterior interpolar left renal cortical lesion (series 7/image 150), indeterminate, mildly increased from 1.2 cm on 10/25/2018 MRI, not well evaluated on prior MRI due to artifact. Simple 2.2 cm anterior lower left renal cortical cyst. Additional scattered subcentimeter hypodense bilateral renal cortical lesions are too small to characterize. No hydronephrosis. Chronic mild diffuse bladder wall thickening and trabeculation, similar. Small superior bladder wall diverticula. Stomach/Bowel: Normal non-distended stomach. Postsurgical changes from prior small bowel resection with enteroenterostomy in the mid abdomen. No  unexpected small bowel dilatation. No small bowel wall thickening. Marked diffuse colonic diverticulosis, most prominent in the sigmoid colon, with no acute large bowel wall thickening or pericolonic fat stranding. 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 Ectatic 2.8 cm diameter right common iliac artery, increased from 2.4 cm on 06/26/2017 CT. No pathologically enlarged lymph nodes in the abdomen or pelvis. Reproductive: Top-normal size prostate with postsurgical changes from TURP in the right prostate. Nonspecific coarse internal prostatic calcifications. Other: No pneumoperitoneum, ascites or focal fluid collection. Musculoskeletal: No aggressive appearing focal osseous lesions. Bilateral posterior spinal fusion hardware L2-L5. 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 VASCULAR MEASUREMENTS PERTINENT TO TAVR: AORTA: Minimal Aortic Diameter-18.1 x 17.1 mm Severity of Aortic Calcification-severe RIGHT PELVIS: Right Common Iliac Artery - Minimal Diameter-11.8 x 10.9 mm Tortuosity-mild Calcification-moderate (predominantly stented) Right External Iliac Artery - Minimal Diameter-7.4 x 6.2 mm Tortuosity-severe Calcification-severe Right Common Femoral Artery - Minimal Diameter-9.1 x 8.5 mm Tortuosity-mild Calcification-severe LEFT PELVIS: Left Common Iliac Artery - Minimal Diameter-9.7 x 9.1 mm Tortuosity-mild-to-moderate Calcification-severe (entirely stented) Left External Iliac Artery - Minimal Diameter-7.2 x 6.6 mm Tortuosity-mild Calcification-severe (stented proximally) Left Common Femoral Artery - Minimal Diameter-9.2 x 7.8 mm Tortuosity-mild Calcification-severe Review of the MIP images confirms the above findings. IMPRESSION: 1. Vascular findings and measurements pertinent to potential TAVR procedure, as detailed. 2. Diffuse thickening and coarse calcification  of the aortic valve, compatible with reported aortic stenosis. 3. Borderline mild cardiomegaly. Three-vessel coronary atherosclerosis. 4. Small dependent bilateral pleural effusions. 5. Severe centrilobular emphysema with diffuse bronchial wall thickening and saber sheath trachea, suggesting COPD. 6. Hypodense exophytic 1.6 cm anterior interpolar left renal cortical lesion, indeterminate for renal neoplasm, mildly increased from 1.2 cm on 10/25/2018 MRI, not well evaluated on prior MRI due to artifact. Suggest attention on follow-up renal mass protocol CT abdomen without and with IV contrast in 3-6 months. 7. Cholelithiasis. 8. Marked diffuse colonic diverticulosis. 9. Stable left adrenal adenoma. 10. 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 11. Aortic Atherosclerosis (ICD10-I70.0) and Emphysema (ICD10-J43.9). Electronically Signed   By: Ilona Sorrel M.D.   On: 06/01/2021 14:14   VAS US CAROTID  Result Date: 06/01/2021 Carotid Arterial Duplex Study Patient Name:  Kyle Lewis  Date of Exam:   06/01/2021 Medical Rec #: [id number]        Accession #:    [id number] Date of Birth: [birth year]       Patient Gender: M Patient Age:   60 years Exam Location:  [facility] Procedure:      VAS US CAROTID Referring Phys: Barb Merino --------------------------------------------------------------------------------  Indications:       Stenosis. Risk Factors:      Hypertension,  current smoker. Limitations        Today's exam was limited due to the body habitus of the                    patient, the high bifurcation of the carotid, heavy                    calcification and the resulting shadowing, the patient's                    respiratory variation and patient movement. Comparison Study:  No prior studies. Performing Technologist: Oliver Hum RVT  Examination Guidelines: A complete  evaluation includes B-mode imaging, spectral Doppler, color Doppler, and power Doppler as needed of all accessible portions of each vessel. Bilateral testing is considered an integral part of a complete examination. Limited examinations for reoccurring indications may be performed as noted.  Right Carotid Findings: +----------+--------+--------+--------+-----------------------+--------+             PSV cm/s EDV cm/s Stenosis Plaque Description      Comments  +----------+--------+--------+--------+-----------------------+--------+  CCA Prox   65       1                 smooth and heterogenous           +----------+--------+--------+--------+-----------------------+--------+  CCA Distal 43       4                 smooth and heterogenous           +----------+--------+--------+--------+-----------------------+--------+  ICA Prox                     Occluded calcific                          +----------+--------+--------+--------+-----------------------+--------+  ICA Distal                   Occluded                                   +----------+--------+--------+--------+-----------------------+--------+  ECA        75       7                                                   +----------+--------+--------+--------+-----------------------+--------+ +----------+--------+-------+--------+-------------------+             PSV cm/s EDV cms Describe Arm Pressure (mmHG)  +----------+--------+-------+--------+-------------------+  Subclavian 77                                             +----------+--------+-------+--------+-------------------+ +---------+--------+---+--------+--+---------+  Vertebral PSV cm/s 167 EDV cm/s 63 Antegrade  +---------+--------+---+--------+--+---------+  Left Carotid Findings: +----------+--------+--------+--------+-----------------------+--------+             PSV cm/s EDV cm/s Stenosis Plaque Description      Comments  +----------+--------+--------+--------+-----------------------+--------+  CCA  Prox   90       22                smooth and heterogenous           +----------+--------+--------+--------+-----------------------+--------+  CCA Distal 49       20                smooth and heterogenous           +----------+--------+--------+--------+-----------------------+--------+  ICA Prox   121      50       40-59%   calcific                tortuous  +----------+--------+--------+--------+-----------------------+--------+  ICA Mid    114      49       40-59%                                     +----------+--------+--------+--------+-----------------------+--------+  ICA Distal 68       35                                        tortuous  +----------+--------+--------+--------+-----------------------+--------+  ECA        70       5                                                   +----------+--------+--------+--------+-----------------------+--------+ +----------+--------+--------+--------+-------------------+             PSV cm/s EDV cm/s Describe Arm Pressure (mmHG)  +----------+--------+--------+--------+-------------------+  Subclavian 144                                             +----------+--------+--------+--------+-------------------+ +---------+--------+--+--------+--+---------+  Vertebral PSV cm/s 36 EDV cm/s 16 Antegrade  +---------+--------+--+--------+--+---------+   Summary: Right 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 Left Carotid: Velocities in the left ICA are consistent with a 40-59% stenosis. Vertebrals: Bilateral vertebral arteries demonstrate antegrade flow. *See table(s) above for measurements and observations.  Electronically signed by Jamelle Haring on 06/01/2021 at 3:21:01 PM.    Final    CT Angio Abd/Pel w/ and/or w/o  Result Date: 06/01/2021 CLINICAL DATA:  Inpatient. 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 Aortic stenosis. EXAM: CT ANGIOGRAPHY CHEST, ABDOMEN AND PELVIS TECHNIQUE: Multidetector CT imaging through the chest, abdomen and  pelvis was performed using the standard protocol during bolus administration of intravenous contrast. Multiplanar reconstructed images and MIPs were obtained and reviewed to evaluate the vascular anatomy. CONTRAST:  174mL OMNIPAQUE IOHEXOL 350 MG/ML SOLN COMPARISON:  06/26/2017 CT angiogram of the chest, abdomen and pelvis. 05/12/2018 unenhanced CT abdomen/pelvis. 10/25/2018 MRI abdomen. FINDINGS: CTA CHEST FINDINGS Cardiovascular: Borderline mild cardiomegaly. No significant pericardial effusion/thickening. Diffuse thickening and coarse calcification of the aortic valve. Three-vessel coronary atherosclerosis. Atherosclerotic nonaneurysmal thoracic aorta. Top-normal caliber main pulmonary artery (3.1 cm diameter). No central pulmonary emboli. Mediastinum/Nodes: No discrete thyroid nodules. Unremarkable esophagus. No pathologically enlarged axillary, mediastinal or hilar lymph nodes. Lungs/Pleura: No pneumothorax. Small dependent bilateral pleural effusions. Severe centrilobular emphysema with diffuse bronchial wall thickening. Saber sheath trachea. Right upper lobe 0.3 cm solid pulmonary nodule (series 8/image 64), stable since 06/26/2017 chest CT. Mild-to-moderate dependent bilateral lower lobe atelectasis. No lung masses or additional significant pulmonary nodules in the aerated  portions of the lungs. Musculoskeletal: No aggressive appearing focal osseous lesions. Mild thoracic spondylosis. Marked degenerative disc disease throughout the cervical spine. CTA ABDOMEN AND PELVIS FINDINGS Hepatobiliary: Normal liver size. Several scattered subcentimeter simple liver cysts throughout the liver, not appreciably changed from 10/25/2018 MRI abdomen. No appreciable new liver lesions. Cholelithiasis. No biliary ductal dilatation. Pancreas: Normal, with no mass or duct dilation. Spleen: Normal size. No mass. Adrenals/Urinary Tract: Normal right adrenal. Left adrenal 2.3 cm nodule with density 44 HU, stable since at least  05/12/2018 unenhanced CT abdomen study, compatible with an adenoma. Hypodense exophytic 1.6 cm anterior interpolar left renal cortical lesion (series 7/image 150), indeterminate, mildly increased from 1.2 cm on 10/25/2018 MRI, not well evaluated on prior MRI due to artifact. Simple 2.2 cm anterior lower left renal cortical cyst. Additional scattered subcentimeter hypodense bilateral renal cortical lesions are too small to characterize. No hydronephrosis. Chronic mild diffuse bladder wall thickening and trabeculation, similar. Small superior bladder wall diverticula. Stomach/Bowel: Normal non-distended stomach. Postsurgical changes from prior small bowel resection with enteroenterostomy in the mid abdomen. No unexpected small bowel dilatation. No small bowel wall thickening. Marked diffuse colonic diverticulosis, most prominent in the sigmoid colon, with no acute large bowel wall thickening or pericolonic fat stranding. 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 Ectatic 2.8 cm diameter right common iliac artery, increased from 2.4 cm on 06/26/2017 CT. No pathologically enlarged lymph nodes in the abdomen or pelvis. Reproductive: Top-normal size prostate with postsurgical changes from TURP in the right prostate. Nonspecific coarse internal prostatic calcifications. Other: No pneumoperitoneum, ascites or focal fluid collection. Musculoskeletal: No aggressive appearing focal osseous lesions. Bilateral posterior spinal fusion hardware L2-L5. 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 VASCULAR MEASUREMENTS PERTINENT TO TAVR: AORTA: Minimal Aortic Diameter-18.1 x 17.1 mm Severity of Aortic Calcification-severe RIGHT PELVIS: Right Common Iliac Artery - Minimal Diameter-11.8 x 10.9 mm Tortuosity-mild Calcification-moderate (predominantly stented) Right External Iliac Artery -  Minimal Diameter-7.4 x 6.2 mm Tortuosity-severe Calcification-severe Right Common Femoral Artery - Minimal Diameter-9.1 x 8.5 mm Tortuosity-mild Calcification-severe LEFT PELVIS: Left Common Iliac Artery - Minimal Diameter-9.7 x 9.1 mm Tortuosity-mild-to-moderate Calcification-severe (entirely stented) Left External Iliac Artery - Minimal Diameter-7.2 x 6.6 mm Tortuosity-mild Calcification-severe (stented proximally) Left Common Femoral Artery - Minimal Diameter-9.2 x 7.8 mm Tortuosity-mild Calcification-severe Review of the MIP images confirms the above findings. IMPRESSION: 1. Vascular findings and measurements pertinent to potential TAVR procedure, as detailed. 2. Diffuse thickening and coarse calcification of the aortic valve, compatible with reported aortic stenosis. 3. Borderline mild cardiomegaly. Three-vessel coronary atherosclerosis. 4. Small dependent bilateral pleural effusions. 5. Severe centrilobular emphysema with diffuse bronchial wall thickening and saber sheath trachea, suggesting COPD. 6. Hypodense exophytic 1.6 cm anterior interpolar left renal cortical lesion, indeterminate for renal neoplasm, mildly increased from 1.2 cm on 10/25/2018 MRI, not well evaluated on prior MRI due to artifact. Suggest attention on follow-up renal mass protocol CT abdomen without and with IV contrast in 3-6 months. 7. Cholelithiasis. 8. Marked diffuse colonic diverticulosis. 9. Stable left adrenal adenoma. 10. 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 11. Aortic Atherosclerosis (ICD10-I70.0) and Emphysema (ICD10-J43.9). Electronically Signed   By: Ilona Sorrel M.D.   On: 06/01/2021 14:14        Scheduled Meds:  aspirin EC  81 mg Oral Daily   enoxaparin (LOVENOX) injection  40 mg Subcutaneous QHS   feeding supplement  237 mL Oral BID BM   finasteride  5 mg Oral Daily   fluticasone  furoate-vilanterol  1 puff Inhalation Daily   furosemide  40 mg Oral Daily   gabapentin  300 mg Oral BID   insulin aspart  0-5 Units Subcutaneous QHS   insulin aspart  0-9 Units Subcutaneous TID WC   ipratropium-albuterol  3 mL Nebulization Q6H   mirabegron ER  25 mg Oral Daily   rosuvastatin  20 mg Oral Daily   sodium chloride flush  3 mL Intravenous Q12H   tamsulosin  0.4 mg Oral QPC supper   Continuous Infusions:  sodium chloride       LOS: 6 days    Time spent: 30 minutes    Barb Merino, MD [facility] Pager [phone]

## 2021-06-02 NOTE — Progress Notes (Signed)
Pt placed on BIPAP for night rest.  RT will continue to monitor.

## 2021-06-02 NOTE — Progress Notes (Addendum)
[facility] TEAM  Patient Name: Kyle Lewis Date of Encounter: 06/02/2021  Primary Cardiologist: Dr. Terrill Mohr Problem List     Principal Problem:   Acute CHF (congestive heart failure) (HCC) Active Problems:   OSA (obstructive sleep apnea)   Exogenous obesity   Tobacco use   Severe aortic stenosis   Acute respiratory failure with hypoxia and hypercapnia (HCC)   Acute on chronic diastolic heart failure (HCC)   Mitral regurgitation  Subjective   Feeling well today. Up to chair. O2 saturations remain in the mid to upper 80's even with supplemental oxygen. No other specific complaints.   Inpatient Medications    Scheduled Meds:  aspirin EC  81 mg Oral Daily   enoxaparin (LOVENOX) injection  40 mg Subcutaneous QHS   feeding supplement  237 mL Oral BID BM   finasteride  5 mg Oral Daily   fluticasone furoate-vilanterol  1 puff Inhalation Daily   furosemide  40 mg Oral Daily   gabapentin  300 mg Oral BID   insulin aspart  0-5 Units Subcutaneous QHS   insulin aspart  0-9 Units Subcutaneous TID WC   ipratropium-albuterol  3 mL Nebulization Q6H   mirabegron ER  25 mg Oral Daily   rosuvastatin  20 mg Oral Daily   sodium chloride flush  3 mL Intravenous Q12H   tamsulosin  0.4 mg Oral QPC supper   Continuous Infusions:  sodium chloride     PRN Meds: sodium chloride, acetaminophen **OR** acetaminophen, iohexol, ondansetron **OR** ondansetron (ZOFRAN) IV, sodium chloride flush   Vital Signs    Vitals:   06/02/21 0405 06/02/21 0841 06/02/21 0945 06/02/21 1000  BP: (!) 96/57   (!) 98/59  Pulse: 72   83  Resp: 17   16  Temp: 98.4 F (36.9 C)     TempSrc:      SpO2: 96% 92% 95% 91%  Weight:      Height:        Intake/Output Summary (Last 24 hours) at 06/02/2021 1210 Last data filed at 06/02/2021 0900 Gross per 24 hour  Intake 1560 ml  Output 2150 ml  Net -590 ml   Filed Weights   05/31/21 0446 06/01/21 0000  06/02/21 0006  Weight: 99.3 kg 95 kg 94.9 kg    Physical Exam    General: Obese, NAD Neck: Negative for carotid bruits. No JVD Lungs: Diminished in lower lobes with trace wheezing throughout. Breathing is unlabored. Cardiovascular: RRR with S1 S2. + harsh systolic murmur Abdomen: Soft, non-tender, non-distended. No obvious abdominal masses. Extremities: No edema. Neuro: Alert and oriented. No focal deficits. No facial asymmetry. MAE spontaneously. Psych: Responds to questions appropriately with normal affect.    Labs    CBC Recent Labs    05/31/21 1320 05/31/21 1323  HGB 16.3 17.0  HCT 48.0 91.4   Basic Metabolic Panel Recent Labs    06/01/21 0824 06/02/21 0351  NA 136 133*  K 4.4 4.7  CL 86* 85*  CO2 43* 40*  GLUCOSE 138* 92  BUN 33* 40*  CREATININE 0.85 1.11  CALCIUM 8.5* 8.3*   Liver Function Tests No results for input(s): AST, ALT, ALKPHOS, BILITOT, PROT, ALBUMIN in the last 72 hours. No results for input(s): LIPASE, AMYLASE in the last 72 hours. Cardiac Enzymes No results for input(s): CKTOTAL, CKMB, CKMBINDEX, TROPONINI in the last 72 hours. BNP Invalid input(s): POCBNP D-Dimer No results for input(s): DDIMER in the  last 72 hours. Hemoglobin A1C No results for input(s): HGBA1C in the last 72 hours. Fasting Lipid Panel Recent Labs    05/31/21 0419  CHOL 159  HDL 58  LDLCALC 88  TRIG 64  CHOLHDL 2.7   Thyroid Function Tests No results for input(s): TSH, T4TOTAL, T3FREE, THYROIDAB in the last 72 hours.  Invalid input(s): [place]  Telemetry     06/02/21 NSR with rates in the 80's- Personally Reviewed  ECG    05/31/21 NSR with HR 88bpm, known IVCD- Personally Reviewed  Radiology    DG Orthopantogram  Result Date: 06/01/2021 CLINICAL DATA:  Poor dentition. EXAM: ORTHOPANTOGRAM/PANORAMIC COMPARISON:  None. FINDINGS: Patient motion blurs the central incisors. Dental fillings are present in the upper and lower molars bilaterally. No  significant dental caries are present. No significant periapical lucencies are present. The mandible is intact and located. IMPRESSION: 1. Dental fillings in the upper and lower molars bilaterally. 2. No significant dental caries. Electronically Signed   By: San Morelle M.D.   On: 06/01/2021 11:46   CARDIAC CATHETERIZATION  Result Date: 05/31/2021   Prox LAD lesion is 25% stenosed.   Dist Cx lesion is 40% stenosed.   Ost 2nd Mrg to 2nd Mrg lesion is 70% stenosed.   Dist RCA lesion is 60% stenosed.   Ost RPDA to RPDA lesion is 40% stenosed. The LAD is a large caliber vessel that courses to the apex. There is mild mid LAD stenosis The Circumflex is a large caliber vessel that gives off a small obtuse marginal branch then 2 moderate caliber obtuse marginal branches. The second obtuse marginal branch has a moderate stenosis, unchanged from last cath in 2019. The RCA is a large dominant artery. The distal RCA has a moderate stenosis, unchanged from last cath in 2019. Severe aortic stenosis (mean gradient 50.6 mmHg, 59 mmHg, AVA 0.93 cm2). Recommendations: Will continue workup for TAVR. His right and left heart pressures are only mildly abnormal following inpatient diuresis. Continue diuresis. The structural heart team will provide a formal consultation regarding his candidacy for TAVR.   CT CORONARY MORPH W/CTA COR W/SCORE W/CA W/CM &/OR WO/CM  Addendum Date: 06/01/2021   ADDENDUM REPORT: 06/01/2021 22:12 CLINICAL DATA:  Severe Aortic Stenosis. EXAM: Cardiac TAVR CT TECHNIQUE: A non-contrast, gated CT scan was obtained with axial slices of 3 mm through the heart for aortic valve calcium scoring. A 110 kV retrospective, gated, contrast cardiac scan was obtained. Gantry rotation speed was 250 msecs and collimation was 0.6 mm. Nitroglycerin was not given. The 3D data set was reconstructed in 5% intervals of the 0-95% of the R-R cycle. Systolic and diastolic phases were analyzed on a dedicated workstation  using MPR, MIP, and VRT modes. The patient received 100 cc of contrast. FINDINGS: Image quality: Poor. The contrast remained in the SVC and the study had poor contrast opacification. Noise artifact is: Moderate. Valve Morphology: Tricuspid aortic valve that is severely calcified. There is bulky calcification of the [place]. Aortic Valve Calcium score: 2634 Aortic annular dimension: Phase assessed: 25% Annular area: 574 mm2 Annular perimeter: 86.2 mm Max diameter: 30.1 mm Min diameter: 24.6 mm Membranous septum length: 13.4 mm Annular and subannular calcification: None. Optimal coplanar projection: RAO 3 CAU 15 Coronary Artery Height above Annulus: Left Main: 9.2 mm Right Coronary: 16.0 mm Sinus of Valsalva Measurements: Non-coronary: 33.5 mm Right-coronary: 33.6 mm Left-coronary: 34.5 mm Sinus of Valsalva Height: Non-coronary: 25.3 mm Right-coronary: 23.9 mm Left-coronary: 19.5 mm Sinotubular Junction: 28 mm Ascending Thoracic  Aorta: 35 mm Coronary Arteries: Normal coronary origin. Right dominance. The study was performed without use of NTG and is insufficient for plaque evaluation. Please refer to recent cardiac catheterization for coronary assessment. 3-vessel coronary calcifications noted. Cardiac Morphology: Right Atrium: Right atrial size is within normal limits. Contrast reflux into the RA consistent with elevated RA pressure. Right Ventricle: The right ventricular cavity is within normal limits. Left Atrium: Left atrial size is normal in size with no left atrial appendage filling defect. Left Ventricle: The ventricular cavity size is within normal limits. There are no stigmata of prior infarction. There is no abnormal filling defect. Normal left ventricular function, LVEF=56%. No regional wall motion abnormalities. Pulmonary arteries: Dilated suggestive of pulmonary hypertension. Pulmonary veins: Normal pulmonary venous drainage. Pericardium: Normal thickness with no significant effusion or calcium present.  Mitral Valve: The mitral valve is normal structure without significant calcification. Extra-cardiac findings: See attached radiology report for non-cardiac structures. IMPRESSION: 1. 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 2. Annular measurements support a 29 mm S3 TAVR (574 mm2). 3. No significant annular or subannular calcifications. 4. Shallow left main height concerning for increased risk for coronary obstruction (9.2 mm). 5. Optimal Fluoroscopic Angle for Delivery: RAO 3 CAU 15 6. Dilated pulmonary artery suggestive of pulmonary hypertension. [place] T. Audie Box, MD Electronically Signed   By: Eleonore Chiquito M.D.   On: 06/01/2021 22:12   Result Date: 06/01/2021 EXAM: OVER-READ INTERPRETATION  CT CHEST The following report is an over-read performed by radiologist Dr. Salvatore Marvel of [facility], [place] on 06/01/2021. This over-read does not include interpretation of cardiac or coronary anatomy or pathology. The coronary CTA interpretation by the cardiologist is attached. COMPARISON:  06/26/2017 chest CT angiogram. FINDINGS: Please see the separate concurrent chest CT angiogram report for details. IMPRESSION: Please see the separate concurrent chest CT angiogram report for details. Electronically Signed: By: Ilona Sorrel M.D. On: 06/01/2021 13:19   CT ANGIO CHEST AORTA W/CM & OR WO/CM  Result Date: 06/01/2021 CLINICAL DATA:  Inpatient. 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 Aortic stenosis. EXAM: CT ANGIOGRAPHY CHEST, ABDOMEN AND PELVIS TECHNIQUE: Multidetector CT imaging through the chest, abdomen and pelvis was performed using the standard protocol during bolus administration of intravenous contrast. Multiplanar reconstructed images and MIPs were obtained and reviewed to evaluate the vascular anatomy. CONTRAST:  159mL OMNIPAQUE IOHEXOL 350 MG/ML SOLN COMPARISON:  06/26/2017 CT angiogram of the chest, abdomen and pelvis. 05/12/2018 unenhanced CT abdomen/pelvis. 10/25/2018 MRI abdomen.  FINDINGS: CTA CHEST FINDINGS Cardiovascular: Borderline mild cardiomegaly. No significant pericardial effusion/thickening. Diffuse thickening and coarse calcification of the aortic valve. Three-vessel coronary atherosclerosis. Atherosclerotic nonaneurysmal thoracic aorta. Top-normal caliber main pulmonary artery (3.1 cm diameter). No central pulmonary emboli. Mediastinum/Nodes: No discrete thyroid nodules. Unremarkable esophagus. No pathologically enlarged axillary, mediastinal or hilar lymph nodes. Lungs/Pleura: No pneumothorax. Small dependent bilateral pleural effusions. Severe centrilobular emphysema with diffuse bronchial wall thickening. Saber sheath trachea. Right upper lobe 0.3 cm solid pulmonary nodule (series 8/image 64), stable since 06/26/2017 chest CT. Mild-to-moderate dependent bilateral lower lobe atelectasis. No lung masses or additional significant pulmonary nodules in the aerated portions of the lungs. Musculoskeletal: No aggressive appearing focal osseous lesions. Mild thoracic spondylosis. Marked degenerative disc disease throughout the cervical spine. CTA ABDOMEN AND PELVIS FINDINGS Hepatobiliary: Normal liver size. Several scattered subcentimeter simple liver cysts throughout the liver, not appreciably changed from 10/25/2018 MRI abdomen. No appreciable new liver lesions. Cholelithiasis. No biliary ductal dilatation. Pancreas: Normal, with no mass or duct dilation. Spleen: Normal  size. No mass. Adrenals/Urinary Tract: Normal right adrenal. Left adrenal 2.3 cm nodule with density 44 HU, stable since at least 05/12/2018 unenhanced CT abdomen study, compatible with an adenoma. Hypodense exophytic 1.6 cm anterior interpolar left renal cortical lesion (series 7/image 150), indeterminate, mildly increased from 1.2 cm on 10/25/2018 MRI, not well evaluated on prior MRI due to artifact. Simple 2.2 cm anterior lower left renal cortical cyst. Additional scattered subcentimeter hypodense bilateral renal  cortical lesions are too small to characterize. No hydronephrosis. Chronic mild diffuse bladder wall thickening and trabeculation, similar. Small superior bladder wall diverticula. Stomach/Bowel: Normal non-distended stomach. Postsurgical changes from prior small bowel resection with enteroenterostomy in the mid abdomen. No unexpected small bowel dilatation. No small bowel wall thickening. Marked diffuse colonic diverticulosis, most prominent in the sigmoid colon, with no acute large bowel wall thickening or pericolonic fat stranding. 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 Ectatic 2.8 cm diameter right common iliac artery, increased from 2.4 cm on 06/26/2017 CT. No pathologically enlarged lymph nodes in the abdomen or pelvis. Reproductive: Top-normal size prostate with postsurgical changes from TURP in the right prostate. Nonspecific coarse internal prostatic calcifications. Other: No pneumoperitoneum, ascites or focal fluid collection. Musculoskeletal: No aggressive appearing focal osseous lesions. Bilateral posterior spinal fusion hardware L2-L5. 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 VASCULAR MEASUREMENTS PERTINENT TO TAVR: AORTA: Minimal Aortic Diameter-18.1 x 17.1 mm Severity of Aortic Calcification-severe RIGHT PELVIS: Right Common Iliac Artery - Minimal Diameter-11.8 x 10.9 mm Tortuosity-mild Calcification-moderate (predominantly stented) Right External Iliac Artery - Minimal Diameter-7.4 x 6.2 mm Tortuosity-severe Calcification-severe Right Common Femoral Artery - Minimal Diameter-9.1 x 8.5 mm Tortuosity-mild Calcification-severe LEFT PELVIS: Left Common Iliac Artery - Minimal Diameter-9.7 x 9.1 mm Tortuosity-mild-to-moderate Calcification-severe (entirely stented) Left External Iliac Artery - Minimal Diameter-7.2 x 6.6 mm Tortuosity-mild  Calcification-severe (stented proximally) Left Common Femoral Artery - Minimal Diameter-9.2 x 7.8 mm Tortuosity-mild Calcification-severe Review of the MIP images confirms the above findings. IMPRESSION: 1. Vascular findings and measurements pertinent to potential TAVR procedure, as detailed. 2. Diffuse thickening and coarse calcification of the aortic valve, compatible with reported aortic stenosis. 3. Borderline mild cardiomegaly. Three-vessel coronary atherosclerosis. 4. Small dependent bilateral pleural effusions. 5. Severe centrilobular emphysema with diffuse bronchial wall thickening and saber sheath trachea, suggesting COPD. 6. Hypodense exophytic 1.6 cm anterior interpolar left renal cortical lesion, indeterminate for renal neoplasm, mildly increased from 1.2 cm on 10/25/2018 MRI, not well evaluated on prior MRI due to artifact. Suggest attention on follow-up renal mass protocol CT abdomen without and with IV contrast in 3-6 months. 7. Cholelithiasis. 8. Marked diffuse colonic diverticulosis. 9. Stable left adrenal adenoma. 10. 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 11. Aortic Atherosclerosis (ICD10-I70.0) and Emphysema (ICD10-J43.9). Electronically Signed   By: Ilona Sorrel M.D.   On: 06/01/2021 14:14   VAS US CAROTID  Result Date: 06/01/2021 Carotid Arterial Duplex Study Patient Name:  KALLEN MCCRYSTAL  Date of Exam:   06/01/2021 Medical Rec #: [id number]        Accession #:    [id number] Date of Birth: 1946-01-15       Patient Gender: M Patient Age:   75 years Exam Location:  [facility] Procedure:      VAS US CAROTID Referring Phys: Barb Merino --------------------------------------------------------------------------------  Indications:  Stenosis. Risk Factors:      Hypertension, current smoker. Limitations        Today's exam was limited due to the body habitus of  the                    patient, the high bifurcation of the carotid, heavy                    calcification and the resulting shadowing, the patient's                    respiratory variation and patient movement. Comparison Study:  No prior studies. Performing Technologist: Oliver Hum RVT  Examination Guidelines: A complete evaluation includes B-mode imaging, spectral Doppler, color Doppler, and power Doppler as needed of all accessible portions of each vessel. Bilateral testing is considered an integral part of a complete examination. Limited examinations for reoccurring indications may be performed as noted.  Right Carotid Findings: +----------+--------+--------+--------+-----------------------+--------+             PSV cm/s EDV cm/s Stenosis Plaque Description      Comments  +----------+--------+--------+--------+-----------------------+--------+  CCA Prox   65       1                 smooth and heterogenous           +----------+--------+--------+--------+-----------------------+--------+  CCA Distal 43       4                 smooth and heterogenous           +----------+--------+--------+--------+-----------------------+--------+  ICA Prox                     Occluded calcific                          +----------+--------+--------+--------+-----------------------+--------+  ICA Distal                   Occluded                                   +----------+--------+--------+--------+-----------------------+--------+  ECA        75       7                                                   +----------+--------+--------+--------+-----------------------+--------+ +----------+--------+-------+--------+-------------------+             PSV cm/s EDV cms Describe Arm Pressure (mmHG)  +----------+--------+-------+--------+-------------------+  Subclavian 77                                             +----------+--------+-------+--------+-------------------+ +---------+--------+---+--------+--+---------+   Vertebral PSV cm/s 167 EDV cm/s 63 Antegrade  +---------+--------+---+--------+--+---------+  Left Carotid Findings: +----------+--------+--------+--------+-----------------------+--------+             PSV cm/s EDV cm/s Stenosis Plaque Description      Comments  +----------+--------+--------+--------+-----------------------+--------+  CCA Prox   90       22  smooth and heterogenous           +----------+--------+--------+--------+-----------------------+--------+  CCA Distal 49       20                smooth and heterogenous           +----------+--------+--------+--------+-----------------------+--------+  ICA Prox   121      50       40-59%   calcific                tortuous  +----------+--------+--------+--------+-----------------------+--------+  ICA Mid    114      49       40-59%                                     +----------+--------+--------+--------+-----------------------+--------+  ICA Distal 68       35                                        tortuous  +----------+--------+--------+--------+-----------------------+--------+  ECA        70       5                                                   +----------+--------+--------+--------+-----------------------+--------+ +----------+--------+--------+--------+-------------------+             PSV cm/s EDV cm/s Describe Arm Pressure (mmHG)  +----------+--------+--------+--------+-------------------+  Subclavian 144                                             +----------+--------+--------+--------+-------------------+ +---------+--------+--+--------+--+---------+  Vertebral PSV cm/s 36 EDV cm/s 16 Antegrade  +---------+--------+--+--------+--+---------+   Summary: Right 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 Left Carotid: Velocities in the left ICA are consistent with a 40-59% stenosis. Vertebrals: Bilateral vertebral arteries demonstrate antegrade flow. *See table(s) above for measurements and observations.   Electronically signed by Jamelle Haring on 06/01/2021 at 3:21:01 PM.    Final    CT Angio Abd/Pel w/ and/or w/o  Result Date: 06/01/2021 CLINICAL DATA:  Inpatient. 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 Aortic stenosis. EXAM: CT ANGIOGRAPHY CHEST, ABDOMEN AND PELVIS TECHNIQUE: Multidetector CT imaging through the chest, abdomen and pelvis was performed using the standard protocol during bolus administration of intravenous contrast. Multiplanar reconstructed images and MIPs were obtained and reviewed to evaluate the vascular anatomy. CONTRAST:  17mL OMNIPAQUE IOHEXOL 350 MG/ML SOLN COMPARISON:  06/26/2017 CT angiogram of the chest, abdomen and pelvis. 05/12/2018 unenhanced CT abdomen/pelvis. 10/25/2018 MRI abdomen. FINDINGS: CTA CHEST FINDINGS Cardiovascular: Borderline mild cardiomegaly. No significant pericardial effusion/thickening. Diffuse thickening and coarse calcification of the aortic valve. Three-vessel coronary atherosclerosis. Atherosclerotic nonaneurysmal thoracic aorta. Top-normal caliber main pulmonary artery (3.1 cm diameter). No central pulmonary emboli. Mediastinum/Nodes: No discrete thyroid nodules. Unremarkable esophagus. No pathologically enlarged axillary, mediastinal or hilar lymph nodes. Lungs/Pleura: No pneumothorax. Small dependent bilateral pleural effusions. Severe centrilobular emphysema with diffuse bronchial wall thickening. Saber sheath trachea. Right upper lobe 0.3 cm solid pulmonary nodule (series 8/image 64), stable since 06/26/2017 chest CT. Mild-to-moderate dependent  bilateral lower lobe atelectasis. No lung masses or additional significant pulmonary nodules in the aerated portions of the lungs. Musculoskeletal: No aggressive appearing focal osseous lesions. Mild thoracic spondylosis. Marked degenerative disc disease throughout the cervical spine. CTA ABDOMEN AND PELVIS FINDINGS Hepatobiliary: Normal liver size. Several scattered subcentimeter simple liver cysts  throughout the liver, not appreciably changed from 10/25/2018 MRI abdomen. No appreciable new liver lesions. Cholelithiasis. No biliary ductal dilatation. Pancreas: Normal, with no mass or duct dilation. Spleen: Normal size. No mass. Adrenals/Urinary Tract: Normal right adrenal. Left adrenal 2.3 cm nodule with density 44 HU, stable since at least 05/12/2018 unenhanced CT abdomen study, compatible with an adenoma. Hypodense exophytic 1.6 cm anterior interpolar left renal cortical lesion (series 7/image 150), indeterminate, mildly increased from 1.2 cm on 10/25/2018 MRI, not well evaluated on prior MRI due to artifact. Simple 2.2 cm anterior lower left renal cortical cyst. Additional scattered subcentimeter hypodense bilateral renal cortical lesions are too small to characterize. No hydronephrosis. Chronic mild diffuse bladder wall thickening and trabeculation, similar. Small superior bladder wall diverticula. Stomach/Bowel: Normal non-distended stomach. Postsurgical changes from prior small bowel resection with enteroenterostomy in the mid abdomen. No unexpected small bowel dilatation. No small bowel wall thickening. Marked diffuse colonic diverticulosis, most prominent in the sigmoid colon, with no acute large bowel wall thickening or pericolonic fat stranding. 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 Ectatic 2.8 cm diameter right common iliac artery, increased from 2.4 cm on 06/26/2017 CT. No pathologically enlarged lymph nodes in the abdomen or pelvis. Reproductive: Top-normal size prostate with postsurgical changes from TURP in the right prostate. Nonspecific coarse internal prostatic calcifications. Other: No pneumoperitoneum, ascites or focal fluid collection. Musculoskeletal: No aggressive appearing focal osseous lesions. Bilateral posterior spinal fusion hardware L2-L5. Marked  degenerative disc disease throughout the lumbar spine. VASCULAR MEASUREMENTS PERTINENT TO TAVR: AORTA: Minimal Aortic Diameter-18.1 x 17.1 mm Severity of Aortic Calcification-severe RIGHT PELVIS: Right Common Iliac Artery - Minimal Diameter-11.8 x 10.9 mm Tortuosity-mild Calcification-moderate (predominantly stented) Right External Iliac Artery - Minimal Diameter-7.4 x 6.2 mm Tortuosity-severe Calcification-severe Right Common Femoral Artery - Minimal Diameter-9.1 x 8.5 mm Tortuosity-mild Calcification-severe LEFT PELVIS: Left Common Iliac Artery - Minimal Diameter-9.7 x 9.1 mm Tortuosity-mild-to-moderate Calcification-severe (entirely stented) Left External Iliac Artery - Minimal Diameter-7.2 x 6.6 mm Tortuosity-mild Calcification-severe (stented proximally) Left Common Femoral Artery - Minimal Diameter-9.2 x 7.8 mm Tortuosity-mild Calcification-severe Review of the MIP images confirms the above findings. IMPRESSION: 1. Vascular findings and measurements pertinent to potential TAVR procedure, as detailed. 2. Diffuse thickening and coarse calcification of the aortic valve, compatible with reported aortic stenosis. 3. Borderline mild cardiomegaly. Three-vessel coronary atherosclerosis. 4. Small dependent bilateral pleural effusions. 5. Severe centrilobular emphysema with diffuse bronchial wall thickening and saber sheath trachea, suggesting COPD. 6. Hypodense exophytic 1.6 cm anterior interpolar left renal cortical lesion, indeterminate for renal neoplasm, mildly increased from 1.2 cm on 10/25/2018 MRI, not well evaluated on prior MRI due to artifact. Suggest attention on follow-up renal mass protocol CT abdomen without and with IV contrast in 3-6 months. 7. Cholelithiasis. 8. Marked diffuse colonic diverticulosis. 9. Stable left adrenal adenoma. 10. 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 11.  Aortic Atherosclerosis (ICD10-I70.0) and Emphysema (ICD10-J43.9). Electronically Signed   By: Ilona Sorrel M.D.   On: 06/01/2021 14:14    Cardiac Studies  Echocardiogram 05/28/21:   1. Left ventricular ejection fraction, by estimation, is approximately  55%. The left ventricle has normal function. Left ventricular endocardial  border not optimally defined to evaluate regional wall motion. There is  severe concentric left ventricular  hypertrophy. Left ventricular diastolic parameters are consistent with  Grade I diastolic dysfunction (impaired relaxation). Elevated left  ventricular end-diastolic pressure.   2. Right ventricular systolic function is normal. The right ventricular  size is normal. There is mildly elevated pulmonary artery systolic  pressure. The estimated right ventricular systolic pressure is 28.4 mmHg.   3. Left atrial size was mildly dilated.   4. Right atrial size was mildly dilated.   5. There is trivial pericardial effusion surrounding the apex.   6. The mitral valve is degenerative. Moderate mitral valve regurgitation  and very eccentric. The mean mitral valve gradient is 3.0 mmHg. Moderate  mitral annular calcification.   7. Tricuspid valve regurgitation is moderate.   8. The aortic valve is tricuspid. There is severe calcifcation of the  aortic valve. Aortic valve regurgitation is not visualized. Severe aortic  valve stenosis. Aortic valve mean gradient measures 56.5 mmHg.  Dimentionless index 0.19.   9. The inferior vena cava is dilated in size with <50% respiratory  variability, suggesting right atrial pressure of 15 mmHg.   Comparison(s): Prior images reviewed side by side. Aortic stenosis has  progressed and in severe range. Mitral regurgitation is very eccentric and  likely moderate.   R/LHC 05/31/21:    Prox LAD lesion is 25% stenosed.   Dist Cx lesion is 40% stenosed.   Ost 2nd Mrg to 2nd Mrg lesion is 70% stenosed.   Dist RCA lesion is 60%  stenosed.   Ost RPDA to RPDA lesion is 40% stenosed.   The LAD is a large caliber vessel that courses to the apex. There is mild mid LAD stenosis The Circumflex is a large caliber vessel that gives off a small obtuse marginal branch then 2 moderate caliber obtuse marginal branches. The second obtuse marginal branch has a moderate stenosis, unchanged from last cath in 2019.  The RCA is a large dominant artery. The distal RCA has a moderate stenosis, unchanged from last cath in 2019.  Severe aortic stenosis (mean gradient 50.6 mmHg, 59 mmHg, AVA 0.93 cm2).    Recommendations: Will continue workup for TAVR. His right and left heart pressures are only mildly abnormal following inpatient diuresis. Continue diuresis. The structural heart team will provide a formal consultation regarding his candidacy for TAVR.   CTA 06/01/21: IMPRESSION: 1. 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   2. Annular measurements support a 29 mm S3 TAVR (574 mm2).   3. No significant annular or subannular calcifications.   4. Shallow left main height concerning for increased risk for coronary obstruction (9.2 mm).   5. Optimal Fluoroscopic Angle for Delivery: RAO 3 CAU 15   6. Dilated pulmonary artery suggestive of pulmonary hypertension.   CT chest 06/01/21:  IMPRESSION: 1. Vascular findings and measurements pertinent to potential TAVR procedure, as detailed. 2. Diffuse thickening and coarse calcification of the aortic valve, compatible with reported aortic stenosis. 3. Borderline mild cardiomegaly. Three-vessel coronary atherosclerosis. 4. Small dependent bilateral pleural effusions. 5. Severe centrilobular emphysema with diffuse bronchial wall thickening and saber sheath trachea, suggesting COPD. 6. Hypodense exophytic 1.6 cm anterior interpolar left renal cortical lesion, indeterminate for renal neoplasm, mildly increased from 1.2 cm on 10/25/2018 MRI, not well evaluated on prior MRI due to  artifact. Suggest attention on follow-up renal mass protocol CT abdomen without and with IV contrast in 3-6 months. 7. Cholelithiasis. 8. Marked diffuse colonic diverticulosis. 9. Stable left adrenal adenoma. 10. 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 11. Aortic Atherosclerosis (ICD10-I70.0) and Emphysema (ICD10-J43.9).  Orthopantogram 06/01/21:  FINDINGS: Patient motion blurs the central incisors. Dental fillings are present in the upper and lower molars bilaterally. No significant dental caries are present. No significant periapical lucencies are present. The mandible is intact and located.   IMPRESSION: 1. Dental fillings in the upper and lower molars bilaterally. 2. No significant dental caries.   Patient Profile     RUDOLPH DAOUST is a 75 y.o. male with a hx of medically managed CAD per cardiac catheterization 2019, AAA s/p repair 2016, previously moderate aortic stenosis>>now severe per echocardiogram, carotid artery disease with known RICA occlusion and 87-56% LICA disease from 4332, OSA on CPAP, hx of TIA 2013, COPD, ongoing tobacco use and HTN who is being seen today for the evaluation of severe aortic stenosis at the request of Dr. Gardiner Rhyme.  Assessment & Plan    Severe AS: Echocardiogram from 05/28/21 with severe aortic stenosis. V-max 4.9 m/s, mean gradient 59 mmHg, AVA 0.6 cm, DI 0.18. Underwent cardiac catheterization 05/31/21 which confirmed severe AS with mean gradient 30mmHg and moderate CAD with recommendations for continued medical management. Seen in structural heart consultation yesterday with plans to pursue further TAVR workup with CT imaging of the chest, abdomen and pelvise along with dental imaging and carotid doppler studies. Imaging reveals anatomy suitable for TAVR. No dental caries on pantogram and stable carotid disease. Given current  issues with hypoxemia, may consider general anesthesia for TAVR case. Pulmonary consulted yesterday at with time he was placed on Breo and steroid taper. Does not appear to be volume overloaded. Plan for TAVE next Tuesday 06/07/21.   Acute respiratory failure with ongoing hypoxia: Felt to be multifactorial with diastolic HF, severe AS, and COPD. Pulmonary consulted yesterday and placed on steroid taper along with Breo. Continue with HS Bipap. Does not appear volume up at this time.   Acute on chronic diastolic CHF: RHC with elevated filling pressures with LVEDP 10-30mmHg and wedge 10-36mmHg. Net negative 7.4L today. Weight, 209lb with an admission weight at 220lb. Will transition IV lasix to PO today. Creatinine creeping up at 1.11 from 0.85.  Mitral regurgitation: Noted on recent echo with eccentric jet. Will follow for improvement with the treatment of AS with TAVR.   Moderate CAD: per cardiac catheterization with moderate RCA and OM2 with recommendations for continued medical management. Continue ASA, statin. No anginal symptoms.    Tobacco Use: Reports ongoing tobacco use, 1/2 pack per day. Cessation encouraged.   Signed, Kathyrn Drown, NP  06/02/2021, 12:10 PM  Pager [phone]   I have personally seen and examined this patient. I agree with the assessment and plan as outlined above.  He is stable today. He reports improvement in dyspnea. No volume overload on exam. We will convert Lasix to po today.  Planning for TAVR next week. Cardiac anatomy suitable for 29 mm Edwards Sapien 3 THV. He has had a AAA repair and what appears to be stents and calcification in his iliac arteries. Will review with team. May need to consider alternative access for TAVR.   Lauree Chandler 06/02/2021 12:26 PM

## 2021-06-03 DIAGNOSIS — I350 Nonrheumatic aortic (valve) stenosis: Secondary | ICD-10-CM | POA: Diagnosis not present

## 2021-06-03 DIAGNOSIS — I509 Heart failure, unspecified: Secondary | ICD-10-CM | POA: Diagnosis not present

## 2021-06-03 DIAGNOSIS — J9601 Acute respiratory failure with hypoxia: Secondary | ICD-10-CM | POA: Diagnosis not present

## 2021-06-03 LAB — GLUCOSE, CAPILLARY
Glucose-Capillary: 90 mg/dL (ref 70–99)
Glucose-Capillary: 144 mg/dL — ABNORMAL HIGH (ref 70–99)
Glucose-Capillary: 150 mg/dL — ABNORMAL HIGH (ref 70–99)

## 2021-06-03 LAB — BASIC METABOLIC PANEL
Anion gap: 8 (ref 5–15)
BUN: 36 mg/dL — ABNORMAL HIGH (ref 8–23)
CO2: 39 mmol/L — ABNORMAL HIGH (ref 22–32)
Calcium: 8.6 mg/dL — ABNORMAL LOW (ref 8.9–10.3)
Chloride: 91 mmol/L — ABNORMAL LOW (ref 98–111)
Creatinine, Ser: 0.8 mg/dL (ref 0.61–1.24)
GFR, Estimated: >60 mL/min (ref >60)
Glucose, Bld: 115 mg/dL — ABNORMAL HIGH (ref 70–99)
Potassium: 4.4 mmol/L (ref 3.5–5.1)
Sodium: 138 mmol/L (ref 135–145)

## 2021-06-03 NOTE — Progress Notes (Signed)
Pt in bed resting well on nasal cannula SP02 96% RR 16.  No distress noted at this time.

## 2021-06-03 NOTE — Progress Notes (Signed)
Mobility Specialist Progress Note:   06/03/21 1026  Mobility  Activity Ambulated in hall  Level of Assistance Standby assist, set-up cues, supervision of patient - no hands on  Assistive Device Four wheel walker  Distance Ambulated (ft) 100 ft  Mobility Ambulated with assistance in hallway  Mobility Response Tolerated well  Mobility performed by Mobility specialist  Bed Position Chair  $Mobility charge 1 Mobility   Pt received in bed willing to participate in mobility. No complaints of pain. Pt returned to chair with call bell in reach and all needs met.   [facility] Public librarian Phone [phone] Secondary Phone [phone]

## 2021-06-03 NOTE — Progress Notes (Signed)
Physical Therapy Treatment Patient Details Name: Kyle Lewis MRN: [id number] DOB: 1945/06/26 Today's Date: 06/03/2021   History of Present Illness Pt is a 75 y.o. male admitted 05/27/21 with worsening SOB and hypoxia. Workup revealed severe AS; now undergoing TAVR workup. Course complicated by hypercardbic respiratory failure. PMH includes CAD, AAA s/p repair, CKD 3, AS, OSA on CPAP, COPD, HTN, CVA.    PT Comments    Pt seated in recliner and required max cues for encouragement.  Pt slow to mobilize and limited due to increase WOB and poor pacing.  Plan remains for HHPT.   HR elevated to 143 bpm with gt so limited distance.    Recommendations for follow up therapy are one component of a multi-disciplinary discharge planning process, led by the attending physician.  Recommendations may be updated based on patient status, additional functional criteria and insurance authorization.  Follow Up Recommendations  Home health PT     Assistance Recommended at Discharge Intermittent Supervision/Assistance  Equipment Recommendations  None recommended by PT    Recommendations for Other Services       Precautions / Restrictions Precautions Precautions: Fall;Other (comment) Precaution Comments: Watch SpO2 (not on home O2) Restrictions Weight Bearing Restrictions: No     Mobility  Bed Mobility               General bed mobility comments: seated in recliner on arrival.    Transfers Overall transfer level: Needs assistance Equipment used: Rolling walker (2 wheels) Transfers: Sit to/from Stand Sit to Stand: Supervision           General transfer comment: Cues for hand placement and technique.    Ambulation/Gait Ambulation/Gait assistance: Min guard Gait Distance (Feet): 60 Feet Assistive device: Rolling walker (2 wheels) Gait Pattern/deviations: Step-through pattern;Decreased stride length;Trunk flexed Gait velocity: Decreased     General Gait Details: Cues for  safety with RW position and postural awareness.   Stairs             Wheelchair Mobility    Modified Rankin (Stroke Patients Only)       Balance Overall balance assessment: Needs assistance   Sitting balance-Leahy Scale: Fair                                      Cognition Arousal/Alertness: Awake/alert Behavior During Therapy: WFL for tasks assessed/performed Overall Cognitive Status: No family/caregiver present to determine baseline cognitive functioning                                 General Comments: noted per chart, wife reports pt with mild dementia. noted decreased awareness (had urinated in bed, aware of it but did not call out for assistance), cues for safety with DME use        Exercises      General Comments        Pertinent Vitals/Pain Pain Assessment: No/denies pain    Home Living                          Prior Function            PT Goals (current goals can now be found in the care plan section) Acute Rehab PT Goals Patient Stated Goal: Return home Potential to Achieve Goals: Good Progress towards PT goals: Progressing toward goals  Frequency    Min 3X/week      PT Plan Current plan remains appropriate    Co-evaluation              AM-PAC PT "6 Clicks" Mobility   Outcome Measure  Help needed turning from your back to your side while in a flat bed without using bedrails?: A Little Help needed moving from lying on your back to sitting on the side of a flat bed without using bedrails?: A Little Help needed moving to and from a bed to a chair (including a wheelchair)?: A Little Help needed standing up from a chair using your arms (e.g., wheelchair or bedside chair)?: A Little Help needed to walk in hospital room?: A Little Help needed climbing 3-5 steps with a railing? : A Little 6 Click Score: 18    End of Session Equipment Utilized During Treatment: Gait belt;Oxygen Activity  Tolerance: Patient tolerated treatment well;Patient limited by fatigue Patient left: with call bell/phone within reach;in chair;with chair alarm set Nurse Communication: Mobility status PT Visit Diagnosis: Other abnormalities of gait and mobility (R26.89);Muscle weakness (generalized) (M62.81)     Time: 7357-8978 PT Time Calculation (min) (ACUTE ONLY): 18 min  Charges:  $Gait Training: 8-22 mins                     Erasmo Leventhal , PTA Acute Rehabilitation Services Pager [phone] Office [phone]    Kyle Lewis 06/03/2021, 3:57 PM

## 2021-06-03 NOTE — Progress Notes (Signed)
Progress Note  Patient Name: Kyle Lewis Date of Encounter: 06/03/2021  [facility] Cardiologist: Jenean Lindau, MD   Subjective   No acute events overnight.  Denies dyspnea.    Inpatient Medications    Scheduled Meds:  aspirin EC  81 mg Oral Daily   enoxaparin (LOVENOX) injection  40 mg Subcutaneous QHS   feeding supplement  237 mL Oral BID BM   finasteride  5 mg Oral Daily   fluticasone furoate-vilanterol  1 puff Inhalation Daily   furosemide  40 mg Oral Daily   gabapentin  300 mg Oral BID   insulin aspart  0-5 Units Subcutaneous QHS   insulin aspart  0-9 Units Subcutaneous TID WC   ipratropium-albuterol  3 mL Nebulization Q6H   mirabegron ER  25 mg Oral Daily   rosuvastatin  20 mg Oral Daily   sodium chloride flush  3 mL Intravenous Q12H   tamsulosin  0.4 mg Oral QPC supper   Continuous Infusions:  sodium chloride     PRN Meds: sodium chloride, acetaminophen **OR** acetaminophen, iohexol, ondansetron **OR** ondansetron (ZOFRAN) IV, sodium chloride flush   Vital Signs    Vitals:   06/02/21 1952 06/03/21 0237 06/03/21 0326 06/03/21 0716  BP:   100/62 106/61  Pulse:   71 72  Resp:   20 (!) 22  Temp:   98.5 F (36.9 C)   TempSrc:   Oral   SpO2: 94% 97% (!) 89% 97%  Weight:   96 kg   Height:        Intake/Output Summary (Last 24 hours) at 06/03/2021 0838 Last data filed at 06/03/2021 0836 Gross per 24 hour  Intake 1440 ml  Output 2000 ml  Net -560 ml   Last 3 Weights 06/03/2021 06/02/2021 06/01/2021  Weight (lbs) 211 lb 10.3 oz 209 lb 3.5 oz 209 lb 7 oz  Weight (kg) 96 kg 94.9 kg 95 kg      Telemetry    SR - Personally Reviewed  ECG    SR with IVCD - Personally Reviewed  Physical Exam   GEN: No acute distress.   Neck: No JVD Cardiac: RRR, 3/6 SEM Respiratory: Decreased BS at bases GI: Soft, nontender, non-distended  MS: No edema; No deformity. Neuro:  Nonfocal  Psych: Normal affect   Labs    High Sensitivity Troponin:    Recent Labs  Lab 05/27/21 1420 05/27/21 1904  TROPONINIHS 58* 54*     Chemistry Recent Labs  Lab 05/27/21 1420 05/28/21 0026 05/29/21 0015 05/30/21 0705 05/31/21 0419 05/31/21 1320 05/31/21 1323 06/01/21 0824 06/02/21 0351  NA 144 139 143 141 139   < > 137 136 133*  K 4.1 4.1 4.6 4.3 4.8   < > 4.5 4.4 4.7  CL 104 97* 96* 91* 87*  --   --  86* 85*  CO2 34* 36* 42* 44* 45*  --   --  43* 40*  GLUCOSE 123* 210* 127* 90 99  --   --  138* 92  BUN 23 18 22 22  32*  --   --  33* 40*  CREATININE 0.74 0.81   0.82 1.16 0.73 0.84  --   --  0.85 1.11  CALCIUM 8.4* 7.9* 8.2* 8.3* 8.5*  --   --  8.5* 8.3*  MG  --  2.0 2.3 2.2  --   --   --   --   --   PROT 6.2* 5.5*  --   --   --   --   --   --   --  ALBUMIN 3.7 3.3*  --   --   --   --   --   --   --   AST 19 22  --   --   --   --   --   --   --   ALT 31 29  --   --   --   --   --   --   --   ALKPHOS 56 48  --   --   --   --   --   --   --   BILITOT 0.8 1.1  --   --   --   --   --   --   --   GFRNONAA >60 >60   >60 >60 >60 >60  --   --  >60 >60  ANIONGAP 6 6 5 6 7   --   --  7 8   < > = values in this interval not displayed.    Lipids  Recent Labs  Lab 05/31/21 0419  CHOL 159  TRIG 64  HDL 58  LDLCALC 88  CHOLHDL 2.7    Hematology Recent Labs  Lab 05/27/21 1420 05/28/21 0026 05/31/21 1320 05/31/21 1323  WBC 7.7 5.8  --   --   RBC 5.20 4.94  --   --   HGB 16.6 15.7 16.3 17.0  HCT 52.8* 48.4 48.0 50.0  MCV 101.5* 98.0  --   --   MCH 31.9 31.8  --   --   MCHC 31.4 32.4  --   --   RDW 14.5 14.2  --   --   PLT 186 167  --   --    Thyroid No results for input(s): TSH, FREET4 in the last 168 hours.  BNP Recent Labs  Lab 05/27/21 1420  BNP 1,348.7*    DDimer No results for input(s): DDIMER in the last 168 hours.   Radiology    DG Orthopantogram  Result Date: 06/01/2021 CLINICAL DATA:  Poor dentition. EXAM: ORTHOPANTOGRAM/PANORAMIC COMPARISON:  None. FINDINGS: Patient motion blurs the central incisors. Dental  fillings are present in the upper and lower molars bilaterally. No significant dental caries are present. No significant periapical lucencies are present. The mandible is intact and located. IMPRESSION: 1. Dental fillings in the upper and lower molars bilaterally. 2. No significant dental caries. Electronically Signed   By: San Morelle M.D.   On: 06/01/2021 11:46   CT CORONARY MORPH W/CTA COR W/SCORE W/CA W/CM &/OR WO/CM  Addendum Date: 06/01/2021   ADDENDUM REPORT: 06/01/2021 22:12 CLINICAL DATA:  Severe Aortic Stenosis. EXAM: Cardiac TAVR CT TECHNIQUE: A non-contrast, gated CT scan was obtained with axial slices of 3 mm through the heart for aortic valve calcium scoring. A 110 kV retrospective, gated, contrast cardiac scan was obtained. Gantry rotation speed was 250 msecs and collimation was 0.6 mm. Nitroglycerin was not given. The 3D data set was reconstructed in 5% intervals of the 0-95% of the R-R cycle. Systolic and diastolic phases were analyzed on a dedicated workstation using MPR, MIP, and VRT modes. The patient received 100 cc of contrast. FINDINGS: Image quality: Poor. The contrast remained in the SVC and the study had poor contrast opacification. Noise artifact is: Moderate. Valve Morphology: Tricuspid aortic valve that is severely calcified. There is bulky calcification of the [place]. Aortic Valve Calcium score: 2634 Aortic annular dimension: Phase assessed: 25% Annular area: 574 mm2 Annular perimeter: 86.2 mm Max diameter: 30.1 mm Min diameter:  24.6 mm Membranous septum length: 13.4 mm Annular and subannular calcification: None. Optimal coplanar projection: RAO 3 CAU 15 Coronary Artery Height above Annulus: Left Main: 9.2 mm Right Coronary: 16.0 mm Sinus of Valsalva Measurements: Non-coronary: 33.5 mm Right-coronary: 33.6 mm Left-coronary: 34.5 mm Sinus of Valsalva Height: Non-coronary: 25.3 mm Right-coronary: 23.9 mm Left-coronary: 19.5 mm Sinotubular Junction: 28 mm Ascending Thoracic  Aorta: 35 mm Coronary Arteries: Normal coronary origin. Right dominance. The study was performed without use of NTG and is insufficient for plaque evaluation. Please refer to recent cardiac catheterization for coronary assessment. 3-vessel coronary calcifications noted. Cardiac Morphology: Right Atrium: Right atrial size is within normal limits. Contrast reflux into the RA consistent with elevated RA pressure. Right Ventricle: The right ventricular cavity is within normal limits. Left Atrium: Left atrial size is normal in size with no left atrial appendage filling defect. Left Ventricle: The ventricular cavity size is within normal limits. There are no stigmata of prior infarction. There is no abnormal filling defect. Normal left ventricular function, LVEF=56%. No regional wall motion abnormalities. Pulmonary arteries: Dilated suggestive of pulmonary hypertension. Pulmonary veins: Normal pulmonary venous drainage. Pericardium: Normal thickness with no significant effusion or calcium present. Mitral Valve: The mitral valve is normal structure without significant calcification. Extra-cardiac findings: See attached radiology report for non-cardiac structures. IMPRESSION: 1. 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 2. Annular measurements support a 29 mm S3 TAVR (574 mm2). 3. No significant annular or subannular calcifications. 4. Shallow left main height concerning for increased risk for coronary obstruction (9.2 mm). 5. Optimal Fluoroscopic Angle for Delivery: RAO 3 CAU 15 6. Dilated pulmonary artery suggestive of pulmonary hypertension. [place] T. Audie Box, MD Electronically Signed   By: Eleonore Chiquito M.D.   On: 06/01/2021 22:12   Result Date: 06/01/2021 EXAM: OVER-READ INTERPRETATION  CT CHEST The following report is an over-read performed by radiologist Dr. Salvatore Marvel of [facility], [place] on 06/01/2021. This over-read does not include interpretation of cardiac or coronary anatomy or pathology.  The coronary CTA interpretation by the cardiologist is attached. COMPARISON:  06/26/2017 chest CT angiogram. FINDINGS: Please see the separate concurrent chest CT angiogram report for details. IMPRESSION: Please see the separate concurrent chest CT angiogram report for details. Electronically Signed: By: Ilona Sorrel M.D. On: 06/01/2021 13:19   CT ANGIO CHEST AORTA W/CM & OR WO/CM  Result Date: 06/01/2021 CLINICAL DATA:  Inpatient. 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 Aortic stenosis. EXAM: CT ANGIOGRAPHY CHEST, ABDOMEN AND PELVIS TECHNIQUE: Multidetector CT imaging through the chest, abdomen and pelvis was performed using the standard protocol during bolus administration of intravenous contrast. Multiplanar reconstructed images and MIPs were obtained and reviewed to evaluate the vascular anatomy. CONTRAST:  135mL OMNIPAQUE IOHEXOL 350 MG/ML SOLN COMPARISON:  06/26/2017 CT angiogram of the chest, abdomen and pelvis. 05/12/2018 unenhanced CT abdomen/pelvis. 10/25/2018 MRI abdomen. FINDINGS: CTA CHEST FINDINGS Cardiovascular: Borderline mild cardiomegaly. No significant pericardial effusion/thickening. Diffuse thickening and coarse calcification of the aortic valve. Three-vessel coronary atherosclerosis. Atherosclerotic nonaneurysmal thoracic aorta. Top-normal caliber main pulmonary artery (3.1 cm diameter). No central pulmonary emboli. Mediastinum/Nodes: No discrete thyroid nodules. Unremarkable esophagus. No pathologically enlarged axillary, mediastinal or hilar lymph nodes. Lungs/Pleura: No pneumothorax. Small dependent bilateral pleural effusions. Severe centrilobular emphysema with diffuse bronchial wall thickening. Saber sheath trachea. Right upper lobe 0.3 cm solid pulmonary nodule (series 8/image 64), stable since 06/26/2017 chest CT. Mild-to-moderate dependent bilateral lower lobe atelectasis. No lung masses or additional significant pulmonary nodules in the aerated portions of the  lungs.  Musculoskeletal: No aggressive appearing focal osseous lesions. Mild thoracic spondylosis. Marked degenerative disc disease throughout the cervical spine. CTA ABDOMEN AND PELVIS FINDINGS Hepatobiliary: Normal liver size. Several scattered subcentimeter simple liver cysts throughout the liver, not appreciably changed from 10/25/2018 MRI abdomen. No appreciable new liver lesions. Cholelithiasis. No biliary ductal dilatation. Pancreas: Normal, with no mass or duct dilation. Spleen: Normal size. No mass. Adrenals/Urinary Tract: Normal right adrenal. Left adrenal 2.3 cm nodule with density 44 HU, stable since at least 05/12/2018 unenhanced CT abdomen study, compatible with an adenoma. Hypodense exophytic 1.6 cm anterior interpolar left renal cortical lesion (series 7/image 150), indeterminate, mildly increased from 1.2 cm on 10/25/2018 MRI, not well evaluated on prior MRI due to artifact. Simple 2.2 cm anterior lower left renal cortical cyst. Additional scattered subcentimeter hypodense bilateral renal cortical lesions are too small to characterize. No hydronephrosis. Chronic mild diffuse bladder wall thickening and trabeculation, similar. Small superior bladder wall diverticula. Stomach/Bowel: Normal non-distended stomach. Postsurgical changes from prior small bowel resection with enteroenterostomy in the mid abdomen. No unexpected small bowel dilatation. No small bowel wall thickening. Marked diffuse colonic diverticulosis, most prominent in the sigmoid colon, with no acute large bowel wall thickening or pericolonic fat stranding. 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 Ectatic 2.8 cm diameter right common iliac artery, increased from 2.4 cm on 06/26/2017 CT. No pathologically enlarged lymph nodes in the abdomen or pelvis. Reproductive: Top-normal size prostate with postsurgical changes  from TURP in the right prostate. Nonspecific coarse internal prostatic calcifications. Other: No pneumoperitoneum, ascites or focal fluid collection. Musculoskeletal: No aggressive appearing focal osseous lesions. Bilateral posterior spinal fusion hardware L2-L5. 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 VASCULAR MEASUREMENTS PERTINENT TO TAVR: AORTA: Minimal Aortic Diameter-18.1 x 17.1 mm Severity of Aortic Calcification-severe RIGHT PELVIS: Right Common Iliac Artery - Minimal Diameter-11.8 x 10.9 mm Tortuosity-mild Calcification-moderate (predominantly stented) Right External Iliac Artery - Minimal Diameter-7.4 x 6.2 mm Tortuosity-severe Calcification-severe Right Common Femoral Artery - Minimal Diameter-9.1 x 8.5 mm Tortuosity-mild Calcification-severe LEFT PELVIS: Left Common Iliac Artery - Minimal Diameter-9.7 x 9.1 mm Tortuosity-mild-to-moderate Calcification-severe (entirely stented) Left External Iliac Artery - Minimal Diameter-7.2 x 6.6 mm Tortuosity-mild Calcification-severe (stented proximally) Left Common Femoral Artery - Minimal Diameter-9.2 x 7.8 mm Tortuosity-mild Calcification-severe Review of the MIP images confirms the above findings. IMPRESSION: 1. Vascular findings and measurements pertinent to potential TAVR procedure, as detailed. 2. Diffuse thickening and coarse calcification of the aortic valve, compatible with reported aortic stenosis. 3. Borderline mild cardiomegaly. Three-vessel coronary atherosclerosis. 4. Small dependent bilateral pleural effusions. 5. Severe centrilobular emphysema with diffuse bronchial wall thickening and saber sheath trachea, suggesting COPD. 6. Hypodense exophytic 1.6 cm anterior interpolar left renal cortical lesion, indeterminate for renal neoplasm, mildly increased from 1.2 cm on 10/25/2018 MRI, not well evaluated on prior MRI due to artifact. Suggest attention on follow-up renal mass protocol CT abdomen without and with IV contrast in 3-6  months. 7. Cholelithiasis. 8. Marked diffuse colonic diverticulosis. 9. Stable left adrenal adenoma. 10. 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 11. Aortic Atherosclerosis (ICD10-I70.0) and Emphysema (ICD10-J43.9). Electronically Signed   By: Ilona Sorrel M.D.   On: 06/01/2021 14:14   VAS US CAROTID  Result Date: 06/01/2021 Carotid Arterial Duplex Study Patient Name:  [name]  Date of Exam:  06/01/2021 Medical Rec #: [id number]        Accession #:    [id number] Date of Birth: 14-Apr-1946       Patient Gender: M Patient Age:   44 years Exam Location:  [facility] Procedure:      VAS US CAROTID Referring Phys: Barb Merino --------------------------------------------------------------------------------  Indications:       Stenosis. Risk Factors:      Hypertension, current smoker. Limitations        Today's exam was limited due to the body habitus of the                    patient, the high bifurcation of the carotid, heavy                    calcification and the resulting shadowing, the patient's                    respiratory variation and patient movement. Comparison Study:  No prior studies. Performing Technologist: Oliver Hum RVT  Examination Guidelines: A complete evaluation includes B-mode imaging, spectral Doppler, color Doppler, and power Doppler as needed of all accessible portions of each vessel. Bilateral testing is considered an integral part of a complete examination. Limited examinations for reoccurring indications may be performed as noted.  Right Carotid Findings: +----------+--------+--------+--------+-----------------------+--------+             PSV cm/s EDV cm/s Stenosis Plaque Description      Comments  +----------+--------+--------+--------+-----------------------+--------+  CCA Prox   65       1                 smooth and heterogenous            +----------+--------+--------+--------+-----------------------+--------+  CCA Distal 43       4                 smooth and heterogenous           +----------+--------+--------+--------+-----------------------+--------+  ICA Prox                     Occluded calcific                          +----------+--------+--------+--------+-----------------------+--------+  ICA Distal                   Occluded                                   +----------+--------+--------+--------+-----------------------+--------+  ECA        75       7                                                   +----------+--------+--------+--------+-----------------------+--------+ +----------+--------+-------+--------+-------------------+             PSV cm/s EDV cms Describe Arm Pressure (mmHG)  +----------+--------+-------+--------+-------------------+  Subclavian 77                                             +----------+--------+-------+--------+-------------------+ +---------+--------+---+--------+--+---------+  Vertebral PSV cm/s 167 EDV  cm/s 63 Antegrade  +---------+--------+---+--------+--+---------+  Left Carotid Findings: +----------+--------+--------+--------+-----------------------+--------+             PSV cm/s EDV cm/s Stenosis Plaque Description      Comments  +----------+--------+--------+--------+-----------------------+--------+  CCA Prox   90       22                smooth and heterogenous           +----------+--------+--------+--------+-----------------------+--------+  CCA Distal 49       20                smooth and heterogenous           +----------+--------+--------+--------+-----------------------+--------+  ICA Prox   121      50       40-59%   calcific                tortuous  +----------+--------+--------+--------+-----------------------+--------+  ICA Mid    114      49       40-59%                                     +----------+--------+--------+--------+-----------------------+--------+  ICA Distal 68       35                                         tortuous  +----------+--------+--------+--------+-----------------------+--------+  ECA        70       5                                                   +----------+--------+--------+--------+-----------------------+--------+ +----------+--------+--------+--------+-------------------+             PSV cm/s EDV cm/s Describe Arm Pressure (mmHG)  +----------+--------+--------+--------+-------------------+  Subclavian 144                                             +----------+--------+--------+--------+-------------------+ +---------+--------+--+--------+--+---------+  Vertebral PSV cm/s 36 EDV cm/s 16 Antegrade  +---------+--------+--+--------+--+---------+   Summary: Right 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 Left Carotid: Velocities in the left ICA are consistent with a 40-59% stenosis. Vertebrals: Bilateral vertebral arteries demonstrate antegrade flow. *See table(s) above for measurements and observations.  Electronically signed by Jamelle Haring on 06/01/2021 at 3:21:01 PM.    Final    CT Angio Abd/Pel w/ and/or w/o  Result Date: 06/01/2021 CLINICAL DATA:  Inpatient. 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 Aortic stenosis. EXAM: CT ANGIOGRAPHY CHEST, ABDOMEN AND PELVIS TECHNIQUE: Multidetector CT imaging through the chest, abdomen and pelvis was performed using the standard protocol during bolus administration of intravenous contrast. Multiplanar reconstructed images and MIPs were obtained and reviewed to evaluate the vascular anatomy. CONTRAST:  114mL OMNIPAQUE IOHEXOL 350 MG/ML SOLN COMPARISON:  06/26/2017 CT angiogram of the chest, abdomen and pelvis. 05/12/2018 unenhanced CT abdomen/pelvis. 10/25/2018 MRI abdomen. FINDINGS: CTA CHEST FINDINGS Cardiovascular: Borderline mild cardiomegaly. No significant pericardial effusion/thickening. Diffuse thickening and coarse calcification of the aortic valve. Three-vessel coronary  atherosclerosis. Atherosclerotic nonaneurysmal thoracic  aorta. Top-normal caliber main pulmonary artery (3.1 cm diameter). No central pulmonary emboli. Mediastinum/Nodes: No discrete thyroid nodules. Unremarkable esophagus. No pathologically enlarged axillary, mediastinal or hilar lymph nodes. Lungs/Pleura: No pneumothorax. Small dependent bilateral pleural effusions. Severe centrilobular emphysema with diffuse bronchial wall thickening. Saber sheath trachea. Right upper lobe 0.3 cm solid pulmonary nodule (series 8/image 64), stable since 06/26/2017 chest CT. Mild-to-moderate dependent bilateral lower lobe atelectasis. No lung masses or additional significant pulmonary nodules in the aerated portions of the lungs. Musculoskeletal: No aggressive appearing focal osseous lesions. Mild thoracic spondylosis. Marked degenerative disc disease throughout the cervical spine. CTA ABDOMEN AND PELVIS FINDINGS Hepatobiliary: Normal liver size. Several scattered subcentimeter simple liver cysts throughout the liver, not appreciably changed from 10/25/2018 MRI abdomen. No appreciable new liver lesions. Cholelithiasis. No biliary ductal dilatation. Pancreas: Normal, with no mass or duct dilation. Spleen: Normal size. No mass. Adrenals/Urinary Tract: Normal right adrenal. Left adrenal 2.3 cm nodule with density 44 HU, stable since at least 05/12/2018 unenhanced CT abdomen study, compatible with an adenoma. Hypodense exophytic 1.6 cm anterior interpolar left renal cortical lesion (series 7/image 150), indeterminate, mildly increased from 1.2 cm on 10/25/2018 MRI, not well evaluated on prior MRI due to artifact. Simple 2.2 cm anterior lower left renal cortical cyst. Additional scattered subcentimeter hypodense bilateral renal cortical lesions are too small to characterize. No hydronephrosis. Chronic mild diffuse bladder wall thickening and trabeculation, similar. Small superior bladder wall diverticula. Stomach/Bowel: Normal  non-distended stomach. Postsurgical changes from prior small bowel resection with enteroenterostomy in the mid abdomen. No unexpected small bowel dilatation. No small bowel wall thickening. Marked diffuse colonic diverticulosis, most prominent in the sigmoid colon, with no acute large bowel wall thickening or pericolonic fat stranding. 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 Ectatic 2.8 cm diameter right common iliac artery, increased from 2.4 cm on 06/26/2017 CT. No pathologically enlarged lymph nodes in the abdomen or pelvis. Reproductive: Top-normal size prostate with postsurgical changes from TURP in the right prostate. Nonspecific coarse internal prostatic calcifications. Other: No pneumoperitoneum, ascites or focal fluid collection. Musculoskeletal: No aggressive appearing focal osseous lesions. Bilateral posterior spinal fusion hardware L2-L5. 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 VASCULAR MEASUREMENTS PERTINENT TO TAVR: AORTA: Minimal Aortic Diameter-18.1 x 17.1 mm Severity of Aortic Calcification-severe RIGHT PELVIS: Right Common Iliac Artery - Minimal Diameter-11.8 x 10.9 mm Tortuosity-mild Calcification-moderate (predominantly stented) Right External Iliac Artery - Minimal Diameter-7.4 x 6.2 mm Tortuosity-severe Calcification-severe Right Common Femoral Artery - Minimal Diameter-9.1 x 8.5 mm Tortuosity-mild Calcification-severe LEFT PELVIS: Left Common Iliac Artery - Minimal Diameter-9.7 x 9.1 mm Tortuosity-mild-to-moderate Calcification-severe (entirely stented) Left External Iliac Artery - Minimal Diameter-7.2 x 6.6 mm Tortuosity-mild Calcification-severe (stented proximally) Left Common Femoral Artery - Minimal Diameter-9.2 x 7.8 mm Tortuosity-mild Calcification-severe Review of the MIP images confirms the above findings. IMPRESSION: 1. Vascular  findings and measurements pertinent to potential TAVR procedure, as detailed. 2. Diffuse thickening and coarse calcification of the aortic valve, compatible with reported aortic stenosis. 3. Borderline mild cardiomegaly. Three-vessel coronary atherosclerosis. 4. Small dependent bilateral pleural effusions. 5. Severe centrilobular emphysema with diffuse bronchial wall thickening and saber sheath trachea, suggesting COPD. 6. Hypodense exophytic 1.6 cm anterior interpolar left renal cortical lesion, indeterminate for renal neoplasm, mildly increased from 1.2 cm on 10/25/2018 MRI, not well evaluated on prior MRI due to artifact. Suggest attention on follow-up renal mass protocol CT abdomen without and with IV contrast  in 3-6 months. 7. Cholelithiasis. 8. Marked diffuse colonic diverticulosis. 9. Stable left adrenal adenoma. 10. 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 11. Aortic Atherosclerosis (ICD10-I70.0) and Emphysema (ICD10-J43.9). Electronically Signed   By: Ilona Sorrel M.D.   On: 06/01/2021 14:14    Cardiac Studies   Echocardiogram 05/28/21:   1. Left ventricular ejection fraction, by estimation, is approximately  55%. The left ventricle has normal function. Left ventricular endocardial  border not optimally defined to evaluate regional wall motion. There is  severe concentric left ventricular  hypertrophy. Left ventricular diastolic parameters are consistent with  Grade I diastolic dysfunction (impaired relaxation). Elevated left  ventricular end-diastolic pressure.   2. Right ventricular systolic function is normal. The right ventricular  size is normal. There is mildly elevated pulmonary artery systolic  pressure. The estimated right ventricular systolic pressure is 06.2 mmHg.   3. Left atrial size was mildly dilated.   4. Right atrial size was mildly dilated.   5. There is  trivial pericardial effusion surrounding the apex.   6. The mitral valve is degenerative. Moderate mitral valve regurgitation  and very eccentric. The mean mitral valve gradient is 3.0 mmHg. Moderate  mitral annular calcification.   7. Tricuspid valve regurgitation is moderate.   8. The aortic valve is tricuspid. There is severe calcifcation of the  aortic valve. Aortic valve regurgitation is not visualized. Severe aortic  valve stenosis. Aortic valve mean gradient measures 56.5 mmHg.  Dimentionless index 0.19.   9. The inferior vena cava is dilated in size with <50% respiratory  variability, suggesting right atrial pressure of 15 mmHg.   Comparison(s): Prior images reviewed side by side. Aortic stenosis has  progressed and in severe range. Mitral regurgitation is very eccentric and  likely moderate.    R/LHC 05/31/21:     Prox LAD lesion is 25% stenosed.   Dist Cx lesion is 40% stenosed.   Ost 2nd Mrg to 2nd Mrg lesion is 70% stenosed.   Dist RCA lesion is 60% stenosed.   Ost RPDA to RPDA lesion is 40% stenosed.   The LAD is a large caliber vessel that courses to the apex. There is mild mid LAD stenosis The Circumflex is a large caliber vessel that gives off a small obtuse marginal branch then 2 moderate caliber obtuse marginal branches. The second obtuse marginal branch has a moderate stenosis, unchanged from last cath in 2019.  The RCA is a large dominant artery. The distal RCA has a moderate stenosis, unchanged from last cath in 2019.  Severe aortic stenosis (mean gradient 50.6 mmHg, 59 mmHg, AVA 0.93 cm2).    Recommendations: Will continue workup for TAVR. His right and left heart pressures are only mildly abnormal following inpatient diuresis. Continue diuresis. The structural heart team will provide a formal consultation regarding his candidacy for TAVR.    CTA 06/01/21: IMPRESSION: 1. 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   2. Annular measurements support a 29  mm S3 TAVR (574 mm2).   3. No significant annular or subannular calcifications.   4. Shallow left main height concerning for increased risk for coronary obstruction (9.2 mm).   5. Optimal Fluoroscopic Angle for Delivery: RAO 3 CAU 15   6. Dilated pulmonary artery suggestive of pulmonary hypertension.   CT chest 06/01/21:   IMPRESSION: 1. Vascular findings and measurements pertinent to potential TAVR procedure, as detailed. 2. Diffuse thickening and coarse  calcification of the aortic valve, compatible with reported aortic stenosis. 3. Borderline mild cardiomegaly. Three-vessel coronary atherosclerosis. 4. Small dependent bilateral pleural effusions. 5. Severe centrilobular emphysema with diffuse bronchial wall thickening and saber sheath trachea, suggesting COPD. 6. Hypodense exophytic 1.6 cm anterior interpolar left renal cortical lesion, indeterminate for renal neoplasm, mildly increased from 1.2 cm on 10/25/2018 MRI, not well evaluated on prior MRI due to artifact. Suggest attention on follow-up renal mass protocol CT abdomen without and with IV contrast in 3-6 months. 7. Cholelithiasis. 8. Marked diffuse colonic diverticulosis. 9. Stable left adrenal adenoma. 10. 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 11. Aortic Atherosclerosis (ICD10-I70.0) and Emphysema (ICD10-J43.9).   Orthopantogram 06/01/21:   FINDINGS: Patient motion blurs the central incisors. Dental fillings are present in the upper and lower molars bilaterally. No significant dental caries are present. No significant periapical lucencies are present. The mandible is intact and located.   IMPRESSION: 1. Dental fillings in the upper and lower molars bilaterally. 2. No significant dental caries.  Patient Profile     JAVEON MACMURRAY is a 75 y.o. male with a hx of medically managed CAD per  cardiac catheterization 2019, AAA s/p repair 2016, previously moderate aortic stenosis>>now severe per echocardiogram, carotid artery disease with known RICA occlusion and 16-10% LICA disease from 9604, OSA on CPAP, hx of TIA 2013, COPD, ongoing tobacco use and HTN who is being seen today for the evaluation of severe aortic stenosis at the request of Dr. Gardiner Rhyme.  Assessment & Plan    Severe AS: Plan for TAVR next Tuesday 06/07/21.  Discussed with TAVR team.  Plan on keeping in house until TAVR.   Acute respiratory failure with ongoing hypoxia: Due to COPD, AS, and diastolic dysfunction.  Cont PO lasix for now.  Check Cr.   Acute on chronic diastolic CHF:  Relatively euvolemic on exam, cont PO lasix, monitor cr.   Mitral regurgitation: Noted on recent echo with eccentric jet. Will follow for improvement with the treatment of AS with TAVR.    Moderate CAD: per cardiac catheterization with moderate RCA and OM2 with recommendations for continued medical management. Continue ASA, statin. No anginal symptoms.     Tobacco Use: Reports ongoing tobacco use, 1/2 pack per day. Cessation encouraged.      For questions or updates, please contact [place] Please consult [email or web address] for contact info under        Signed, Early Osmond, MD  06/03/2021, 8:38 AM

## 2021-06-03 NOTE — Progress Notes (Signed)
PROGRESS NOTE    Kyle Lewis  KNL:[id number] DOB: 1945-09-06 DOA: 05/27/2021 PCP: [name], MD    Brief Narrative:  75 year old with history of COPD, obstructive sleep apnea on CPAP at home, hypertension presented to the ER with worsening shortness of breath for about 2 weeks.  Treated with steroids and antibiotics as outpatient.  On arrival to the ER, he was 94% on 4 L oxygen.  Blood pressure stable.  BNP 1348.  COVID and flu negative.  Admitted to hospital with acute on chronic diastolic heart failure secondary to severe aortic stenosis.   Assessment & Plan:   Principal Problem:   Acute CHF (congestive heart failure) (HCC) Active Problems:   OSA (obstructive sleep apnea)   Exogenous obesity   Tobacco use   Severe aortic stenosis   Acute respiratory failure with hypoxia and hypercapnia (HCC)   Acute on chronic diastolic heart failure (HCC)   Mitral regurgitation  Acute on chronic hypoxemic and hypercapnic respiratory failure, multifactorial. Acute on chronic diastolic congestive heart failure, severe aortic stenosis.  Untreated sleep apnea. -Currently using oxygen in the daytime, BiPAP at night.  Patient does have BiPAP at home. Use oxygen to keep saturation more than 90%.  Consistently use BiPAP at night.  Patient will use his CPAP at night when he goes home. -Patient is diuresed with IV Lasix and did fairly well and now converted to oral Lasix.  Aldactone.   -Surgical plan for aortic stenosis as below.  Severe aortic stenosis, symptomatic with shortness of breath, presyncope.  TAVR work-up on progress.   Scheduled for TAVR next week 06/07/2021. Patient is very debilitated and unable to be discharged today.  He will have inpatient procedure, reevaluation for SNF placement.  Patient seen and examined.  Denies any complaints.  No other overnight events.  Smoker: Counseled to quit.   DVT prophylaxis: enoxaparin (LOVENOX) injection 40 mg Start: 06/01/21 2200 SCDs  Start: 05/27/21 2310   Code Status: Full code Family Communication: wife on the phone called and updated. Disposition Plan: Status is: Inpatient  Remains inpatient appropriate because: Inpatient procedures planned   Consultants:  Cardiology Pulmonary   Procedures:  None  Antimicrobials:  None   Subjective:  Patient seen and examined.  No overnight events.  Poor historian.  Feels much better.  Denies any shortness of breath.  Objective: Vitals:   06/03/21 0716 06/03/21 0905 06/03/21 1035 06/03/21 1200  BP: 106/61  (!) 96/52 104/75  Pulse: 72  79   Resp: (!) 22  (!) 25 20  Temp:   97.8 F (36.6 C)   TempSrc:   Oral   SpO2: 97% 94% 92%   Weight:      Height:        Intake/Output Summary (Last 24 hours) at 06/03/2021 1307 Last data filed at 06/03/2021 1239 Gross per 24 hour  Intake 1440 ml  Output 2300 ml  Net -860 ml   Filed Weights   06/01/21 0000 06/02/21 0006 06/03/21 0326  Weight: 95 kg 94.9 kg 96 kg    Examination:  General: Chronically sick looking.  Flat affect.  Fairly comfortable on 3 L oxygen. Cardiovascular: Fine crepitations at bases bilateral.  Respiratory effort is normal. Respiratory: S1-S2 normal.  Regular rate rhythm.  Trace bilateral pedal edema. Gastrointestinal: Soft.  Nondistended.  Bowel sound present. Ext: Trace bilateral pedal edema.  No cyanosis or deformity.   Data Reviewed: I have personally reviewed following labs and imaging studies  CBC: Recent Labs  Lab  05/27/21 1420 05/28/21 0026 05/31/21 1320 05/31/21 1323  WBC 7.7 5.8  --   --   NEUTROABS 6.1 5.3  --   --   HGB 16.6 15.7 16.3 17.0  HCT 52.8* 48.4 48.0 50.0  MCV 101.5* 98.0  --   --   PLT 186 167  --   --    Basic Metabolic Panel: Recent Labs  Lab 05/28/21 0026 05/29/21 0015 05/30/21 0705 05/31/21 0419 05/31/21 1320 05/31/21 1323 06/01/21 0824 06/02/21 0351 06/03/21 0933  NA 139 143 141 139 138 137 136 133* 138  K 4.1 4.6 4.3 4.8 4.2 4.5 4.4 4.7 4.4   CL 97* 96* 91* 87*  --   --  86* 85* 91*  CO2 36* 42* 44* 45*  --   --  43* 40* 39*  GLUCOSE 210* 127* 90 99  --   --  138* 92 115*  BUN 18 22 22  32*  --   --  33* 40* 36*  CREATININE 0.81   0.82 1.16 0.73 0.84  --   --  0.85 1.11 0.80  CALCIUM 7.9* 8.2* 8.3* 8.5*  --   --  8.5* 8.3* 8.6*  MG 2.0 2.3 2.2  --   --   --   --   --   --    GFR: Estimated Creatinine Clearance: 86.6 mL/min (by C-G formula based on SCr of 0.8 mg/dL). Liver Function Tests: Recent Labs  Lab 05/27/21 1420 05/28/21 0026  AST 19 22  ALT 31 29  ALKPHOS 56 48  BILITOT 0.8 1.1  PROT 6.2* 5.5*  ALBUMIN 3.7 3.3*   No results for input(s): LIPASE, AMYLASE in the last 168 hours. No results for input(s): AMMONIA in the last 168 hours. Coagulation Profile: No results for input(s): INR, PROTIME in the last 168 hours. Cardiac Enzymes: No results for input(s): CKTOTAL, CKMB, CKMBINDEX, TROPONINI in the last 168 hours. BNP (last 3 results) No results for input(s): PROBNP in the last 8760 hours. HbA1C: No results for input(s): HGBA1C in the last 72 hours. CBG: Recent Labs  Lab 06/02/21 1128 06/02/21 1631 06/02/21 2045 06/03/21 0558 06/03/21 1111  GLUCAP 109* 109* 107* 90 144*   Lipid Profile: No results for input(s): CHOL, HDL, LDLCALC, TRIG, CHOLHDL, LDLDIRECT in the last 72 hours.  Thyroid Function Tests: No results for input(s): TSH, T4TOTAL, FREET4, T3FREE, THYROIDAB in the last 72 hours. Anemia Panel: No results for input(s): VITAMINB12, FOLATE, FERRITIN, TIBC, IRON, RETICCTPCT in the last 72 hours. Sepsis Labs: No results for input(s): PROCALCITON, LATICACIDVEN in the last 168 hours.  Recent Results (from the past 240 hour(s))  Resp Panel by RT-PCR (Flu A&B, Covid) Nasopharyngeal Swab     Status: None   Collection Time: 05/27/21  2:08 PM   Specimen: Nasopharyngeal Swab; Nasopharyngeal(NP) swabs in vial transport medium  Result Value Ref Range Status   SARS Coronavirus 2 by RT PCR NEGATIVE  NEGATIVE Final    Comment: (NOTE) SARS-CoV-2 target nucleic acids are NOT DETECTED.  The SARS-CoV-2 RNA is generally detectable in upper respiratory specimens during the acute phase of infection. The lowest concentration of SARS-CoV-2 viral copies this assay can detect is 138 copies/mL. A negative result does not preclude SARS-Cov-2 infection and should not be used as the sole basis for treatment or other patient management decisions. A negative result may occur with  improper specimen collection/handling, submission of specimen other than nasopharyngeal swab, presence of viral mutation(s) within the areas targeted by this assay, and  inadequate number of viral copies(<138 copies/mL). A negative result must be combined with clinical observations, patient history, and epidemiological information. The expected result is Negative.  Fact Sheet for Patients:  EntrepreneurPulse.com.au  Fact Sheet for Healthcare Providers:  IncredibleEmployment.be  This test is no t yet approved or cleared by the [place] FDA and  has been authorized for detection and/or diagnosis of SARS-CoV-2 by FDA under an Emergency Use Authorization (EUA). This EUA will remain  in effect (meaning this test can be used) for the duration of the COVID-19 declaration under Section 564(b)(1) of the Act, 21 U.S.C.section 360bbb-3(b)(1), unless the authorization is terminated  or revoked sooner.       Influenza A by PCR NEGATIVE NEGATIVE Final   Influenza B by PCR NEGATIVE NEGATIVE Final    Comment: (NOTE) The Xpert Xpress SARS-CoV-2/FLU/RSV plus assay is intended as an aid in the diagnosis of influenza from Nasopharyngeal swab specimens and should not be used as a sole basis for treatment. Nasal washings and aspirates are unacceptable for Xpert Xpress SARS-CoV-2/FLU/RSV testing.  Fact Sheet for Patients: EntrepreneurPulse.com.au  Fact Sheet for Healthcare  Providers: IncredibleEmployment.be  This test is not yet approved or cleared by the [place] FDA and has been authorized for detection and/or diagnosis of SARS-CoV-2 by FDA under an Emergency Use Authorization (EUA). This EUA will remain in effect (meaning this test can be used) for the duration of the COVID-19 declaration under Section 564(b)(1) of the Act, 21 U.S.C. section 360bbb-3(b)(1), unless the authorization is terminated or revoked.  Performed at [facility], [place] [address], [place], [place] [address]          Radiology Studies: VAS US CAROTID  Result Date: 06/01/2021 Carotid Arterial Duplex Study Patient Name:  FERMAN BASILIO  Date of Exam:   06/01/2021 Medical Rec #: [id number]        Accession #:    [id number] Date of Birth: April 07, 1946       Patient Gender: M Patient Age:   16 years Exam Location:  [facility] Procedure:      VAS US CAROTID Referring Phys: Barb Merino --------------------------------------------------------------------------------  Indications:       Stenosis. Risk Factors:      Hypertension, current smoker. Limitations        Today's exam was limited due to the body habitus of the                    patient, the high bifurcation of the carotid, heavy                    calcification and the resulting shadowing, the patient's                    respiratory variation and patient movement. Comparison Study:  No prior studies. Performing Technologist: Oliver Hum RVT  Examination Guidelines: A complete evaluation includes B-mode imaging, spectral Doppler, color Doppler, and power Doppler as needed of all accessible portions of each vessel. Bilateral testing is considered an integral part of a complete examination. Limited examinations for reoccurring indications may be performed as noted.  Right Carotid Findings: +----------+--------+--------+--------+-----------------------+--------+             PSV cm/s EDV  cm/s Stenosis Plaque Description      Comments  +----------+--------+--------+--------+-----------------------+--------+  CCA Prox   65       1  smooth and heterogenous           +----------+--------+--------+--------+-----------------------+--------+  CCA Distal 43       4                 smooth and heterogenous           +----------+--------+--------+--------+-----------------------+--------+  ICA Prox                     Occluded calcific                          +----------+--------+--------+--------+-----------------------+--------+  ICA Distal                   Occluded                                   +----------+--------+--------+--------+-----------------------+--------+  ECA        75       7                                                   +----------+--------+--------+--------+-----------------------+--------+ +----------+--------+-------+--------+-------------------+             PSV cm/s EDV cms Describe Arm Pressure (mmHG)  +----------+--------+-------+--------+-------------------+  Subclavian 77                                             +----------+--------+-------+--------+-------------------+ +---------+--------+---+--------+--+---------+  Vertebral PSV cm/s 167 EDV cm/s 63 Antegrade  +---------+--------+---+--------+--+---------+  Left Carotid Findings: +----------+--------+--------+--------+-----------------------+--------+             PSV cm/s EDV cm/s Stenosis Plaque Description      Comments  +----------+--------+--------+--------+-----------------------+--------+  CCA Prox   90       22                smooth and heterogenous           +----------+--------+--------+--------+-----------------------+--------+  CCA Distal 49       20                smooth and heterogenous           +----------+--------+--------+--------+-----------------------+--------+  ICA Prox   121      50       40-59%   calcific                tortuous   +----------+--------+--------+--------+-----------------------+--------+  ICA Mid    114      49       40-59%                                     +----------+--------+--------+--------+-----------------------+--------+  ICA Distal 68       35                                        tortuous  +----------+--------+--------+--------+-----------------------+--------+  ECA        70       5                                                   +----------+--------+--------+--------+-----------------------+--------+ +----------+--------+--------+--------+-------------------+  PSV cm/s EDV cm/s Describe Arm Pressure (mmHG)  +----------+--------+--------+--------+-------------------+  Subclavian 144                                             +----------+--------+--------+--------+-------------------+ +---------+--------+--+--------+--+---------+  Vertebral PSV cm/s 36 EDV cm/s 16 Antegrade  +---------+--------+--+--------+--+---------+   Summary: Right 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 Left Carotid: Velocities in the left ICA are consistent with a 40-59% stenosis. Vertebrals: Bilateral vertebral arteries demonstrate antegrade flow. *See table(s) above for measurements and observations.  Electronically signed by Jamelle Haring on 06/01/2021 at 3:21:01 PM.    Final         Scheduled Meds:  aspirin EC  81 mg Oral Daily   enoxaparin (LOVENOX) injection  40 mg Subcutaneous QHS   feeding supplement  237 mL Oral BID BM   finasteride  5 mg Oral Daily   fluticasone furoate-vilanterol  1 puff Inhalation Daily   furosemide  40 mg Oral Daily   gabapentin  300 mg Oral BID   insulin aspart  0-5 Units Subcutaneous QHS   insulin aspart  0-9 Units Subcutaneous TID WC   ipratropium-albuterol  3 mL Nebulization Q6H   mirabegron ER  25 mg Oral Daily   rosuvastatin  20 mg Oral Daily   sodium chloride flush  3 mL Intravenous Q12H   tamsulosin  0.4 mg Oral QPC supper   Continuous  Infusions:  sodium chloride       LOS: 7 days    Time spent: 30 minutes    Barb Merino, MD [facility] Pager [phone]

## 2021-06-04 DIAGNOSIS — I350 Nonrheumatic aortic (valve) stenosis: Secondary | ICD-10-CM | POA: Diagnosis not present

## 2021-06-04 DIAGNOSIS — I340 Nonrheumatic mitral (valve) insufficiency: Secondary | ICD-10-CM | POA: Diagnosis not present

## 2021-06-04 DIAGNOSIS — I509 Heart failure, unspecified: Secondary | ICD-10-CM | POA: Diagnosis not present

## 2021-06-04 DIAGNOSIS — G4733 Obstructive sleep apnea (adult) (pediatric): Secondary | ICD-10-CM | POA: Diagnosis not present

## 2021-06-04 LAB — GLUCOSE, CAPILLARY
Glucose-Capillary: 104 mg/dL — ABNORMAL HIGH (ref 70–99)
Glucose-Capillary: 97 mg/dL (ref 70–99)
Glucose-Capillary: 107 mg/dL — ABNORMAL HIGH (ref 70–99)
Glucose-Capillary: 129 mg/dL — ABNORMAL HIGH (ref 70–99)
Glucose-Capillary: 148 mg/dL — ABNORMAL HIGH (ref 70–99)

## 2021-06-04 MED ORDER — IPRATROPIUM-ALBUTEROL 0.5-2.5 (3) MG/3ML IN SOLN
3.0000 mL | Freq: Three times a day (TID) | RESPIRATORY_TRACT | Status: DC
  Administered 2021-06-04 – 2021-06-05 (×5): 3 mL via RESPIRATORY_TRACT
  Filled 2021-06-04 (×5): qty 3

## 2021-06-04 NOTE — Progress Notes (Signed)
Progress Note  Patient Name: Kyle Lewis Date of Encounter: 06/04/2021  [facility] Cardiologist: Jenean Lindau, MD   Subjective   Feeling well.  Breathing improving.   Inpatient Medications    Scheduled Meds:  aspirin EC  81 mg Oral Daily   enoxaparin (LOVENOX) injection  40 mg Subcutaneous QHS   feeding supplement  237 mL Oral BID BM   finasteride  5 mg Oral Daily   fluticasone furoate-vilanterol  1 puff Inhalation Daily   furosemide  40 mg Oral Daily   gabapentin  300 mg Oral BID   insulin aspart  0-5 Units Subcutaneous QHS   insulin aspart  0-9 Units Subcutaneous TID WC   ipratropium-albuterol  3 mL Nebulization TID   mirabegron ER  25 mg Oral Daily   rosuvastatin  20 mg Oral Daily   sodium chloride flush  3 mL Intravenous Q12H   tamsulosin  0.4 mg Oral QPC supper   Continuous Infusions:  sodium chloride     PRN Meds: sodium chloride, acetaminophen **OR** acetaminophen, iohexol, ondansetron **OR** ondansetron (ZOFRAN) IV, sodium chloride flush   Vital Signs    Vitals:   06/03/21 2115 06/04/21 0357 06/04/21 0726 06/04/21 0844  BP: (!) 112/58 131/65 140/88   Pulse: 71 66 72   Resp: 18 15 18    Temp: 97.7 F (36.5 C) 97.7 F (36.5 C) 97.9 F (36.6 C)   TempSrc: Oral Oral Oral   SpO2: 100% 100% 100% 93%  Weight:  97.1 kg    Height:        Intake/Output Summary (Last 24 hours) at 06/04/2021 1019 Last data filed at 06/04/2021 0820 Gross per 24 hour  Intake 2000 ml  Output 1550 ml  Net 450 ml   Last 3 Weights 06/04/2021 06/03/2021 06/02/2021  Weight (lbs) 214 lb 1.1 oz 211 lb 10.3 oz 209 lb 3.5 oz  Weight (kg) 97.1 kg 96 kg 94.9 kg      Telemetry    Sinus rhythm.  No events.  - Personally Reviewed  ECG    N/a - Personally Reviewed  Physical Exam   GEN: No acute distress.   Neck: No JVD Cardiac: RRR, III/VI systolic murmur at the LUSB, rubs, or gallops.  Respiratory: Clear to auscultation bilaterally. GI: Soft, nontender,  non-distended  MS: No edema; No deformity. Neuro:  Nonfocal  Psych: Normal affect   Labs    High Sensitivity Troponin:   Recent Labs  Lab 05/27/21 1420 05/27/21 1904  TROPONINIHS 58* 54*     Chemistry Recent Labs  Lab 05/29/21 0015 05/30/21 0705 05/31/21 0419 06/01/21 0824 06/02/21 0351 06/03/21 0933  NA 143 141   < > 136 133* 138  K 4.6 4.3   < > 4.4 4.7 4.4  CL 96* 91*   < > 86* 85* 91*  CO2 42* 44*   < > 43* 40* 39*  GLUCOSE 127* 90   < > 138* 92 115*  BUN 22 22   < > 33* 40* 36*  CREATININE 1.16 0.73   < > 0.85 1.11 0.80  CALCIUM 8.2* 8.3*   < > 8.5* 8.3* 8.6*  MG 2.3 2.2  --   --   --   --   GFRNONAA >60 >60   < > >60 >60 >60  ANIONGAP 5 6   < > 7 8 8    < > = values in this interval not displayed.    Lipids  Recent Labs  Lab 05/31/21  0419  CHOL 159  TRIG 64  HDL 58  LDLCALC 88  CHOLHDL 2.7    Hematology Recent Labs  Lab 05/31/21 1320 05/31/21 1323  HGB 16.3 17.0  HCT 48.0 50.0   Thyroid No results for input(s): TSH, FREET4 in the last 168 hours.  BNPNo results for input(s): BNP, PROBNP in the last 168 hours.  DDimer No results for input(s): DDIMER in the last 168 hours.   Radiology    No results found.  Cardiac Studies   Echocardiogram 05/28/21:   1. Left ventricular ejection fraction, by estimation, is approximately  55%. The left ventricle has normal function. Left ventricular endocardial  border not optimally defined to evaluate regional wall motion. There is  severe concentric left ventricular  hypertrophy. Left ventricular diastolic parameters are consistent with  Grade I diastolic dysfunction (impaired relaxation). Elevated left  ventricular end-diastolic pressure.   2. Right ventricular systolic function is normal. The right ventricular  size is normal. There is mildly elevated pulmonary artery systolic  pressure. The estimated right ventricular systolic pressure is 24.0 mmHg.   3. Left atrial size was mildly dilated.   4. Right  atrial size was mildly dilated.   5. There is trivial pericardial effusion surrounding the apex.   6. The mitral valve is degenerative. Moderate mitral valve regurgitation  and very eccentric. The mean mitral valve gradient is 3.0 mmHg. Moderate  mitral annular calcification.   7. Tricuspid valve regurgitation is moderate.   8. The aortic valve is tricuspid. There is severe calcifcation of the  aortic valve. Aortic valve regurgitation is not visualized. Severe aortic  valve stenosis. Aortic valve mean gradient measures 56.5 mmHg.  Dimentionless index 0.19.   9. The inferior vena cava is dilated in size with <50% respiratory  variability, suggesting right atrial pressure of 15 mmHg.   Comparison(s): Prior images reviewed side by side. Aortic stenosis has  progressed and in severe range. Mitral regurgitation is very eccentric and  likely moderate.    R/LHC 05/31/21:     Prox LAD lesion is 25% stenosed.   Dist Cx lesion is 40% stenosed.   Ost 2nd Mrg to 2nd Mrg lesion is 70% stenosed.   Dist RCA lesion is 60% stenosed.   Ost RPDA to RPDA lesion is 40% stenosed.   The LAD is a large caliber vessel that courses to the apex. There is mild mid LAD stenosis The Circumflex is a large caliber vessel that gives off a small obtuse marginal branch then 2 moderate caliber obtuse marginal branches. The second obtuse marginal branch has a moderate stenosis, unchanged from last cath in 2019.  The RCA is a large dominant artery. The distal RCA has a moderate stenosis, unchanged from last cath in 2019.  Severe aortic stenosis (mean gradient 50.6 mmHg, 59 mmHg, AVA 0.93 cm2).    Recommendations: Will continue workup for TAVR. His right and left heart pressures are only mildly abnormal following inpatient diuresis. Continue diuresis. The structural heart team will provide a formal consultation regarding his candidacy for TAVR.    CTA 06/01/21: IMPRESSION: 1. Tricuspid aortic valve with severe aortic  stenosis.   2. Annular measurements support a 29 mm S3 TAVR (574 mm2).   3. No significant annular or subannular calcifications.   4. Shallow left main height concerning for increased risk for coronary obstruction (9.2 mm).   5. Optimal Fluoroscopic Angle for Delivery: RAO 3 CAU 15   6. Dilated pulmonary artery suggestive of pulmonary hypertension.  CT chest 06/01/21:   IMPRESSION: 1. Vascular findings and measurements pertinent to potential TAVR procedure, as detailed. 2. Diffuse thickening and coarse calcification of the aortic valve, compatible with reported aortic stenosis. 3. Borderline mild cardiomegaly. Three-vessel coronary atherosclerosis. 4. Small dependent bilateral pleural effusions. 5. Severe centrilobular emphysema with diffuse bronchial wall thickening and saber sheath trachea, suggesting COPD. 6. Hypodense exophytic 1.6 cm anterior interpolar left renal cortical lesion, indeterminate for renal neoplasm, mildly increased from 1.2 cm on 10/25/2018 MRI, not well evaluated on prior MRI due to artifact. Suggest attention on follow-up renal mass protocol CT abdomen without and with IV contrast in 3-6 months. 7. Cholelithiasis. 8. Marked diffuse colonic diverticulosis. 9. Stable left adrenal adenoma. 10. 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 11. Aortic Atherosclerosis (ICD10-I70.0) and Emphysema (ICD10-J43.9).   Orthopantogram 06/01/21:   FINDINGS: Patient motion blurs the central incisors. Dental fillings are present in the upper and lower molars bilaterally. No significant dental caries are present. No significant periapical lucencies are present. The mandible is intact and located.   IMPRESSION: 1. Dental fillings in the upper and lower molars bilaterally. 2. No significant dental caries.   Patient Profile     75 y.o. male with  nonobstructive CAD, severe aortic stenosis, abdominal aortic aneurysm status postrepair (2016) carotid stenosis with known right ICA stenosis and 40 to 59% left ICA disease, OSA on CPAP, hypertension, and ongoing tobacco abuse here with severe restenosis planning for TAVR on 06/07/2021.  Assessment & Plan    #Severe arctic stenosis: #Moderate mitral regurgitation: Planning for TAVR on 06/07/2021.  Currently stable.  Open moderate mitral vegetation will improve with fixing his aortic valve.  #Acute hypoxic respiratory failure: #Acute on chronic diastolic heart failure: Multifactorial due to underlying COPD, aortic stenosis, and diastolic heart failure.  He is euvolemic on oral Lasix.  Continue to monitor renal function.  #Tobacco use: Continue to recommend smoking cessation.  #Nonobstructive CAD: Moderate disease in the RCA and OM2.  Medical management with aspirin and statin.      For questions or updates, please contact [place] Please consult [email or web address] for contact info under        Signed, Skeet Latch, MD  06/04/2021, 10:19 AM

## 2021-06-04 NOTE — Progress Notes (Signed)
PROGRESS NOTE    Kyle Lewis  STM:[id number] DOB: [birth year] DOA: 05/27/2021 PCP: [name], MD    Brief Narrative:  75 year old with history of COPD, obstructive sleep apnea on CPAP at home, hypertension presented to the ER with worsening shortness of breath for about 2 weeks.  Treated with steroids and antibiotics as outpatient.  On arrival to the ER, he was 94% on 4 L oxygen.  Blood pressure stable.  BNP 1348.  COVID and flu negative.  Admitted to hospital with acute on chronic diastolic heart failure secondary to severe aortic stenosis.   Assessment & Plan:   Principal Problem:   Acute CHF (congestive heart failure) (HCC) Active Problems:   OSA (obstructive sleep apnea)   Exogenous obesity   Tobacco use   Severe aortic stenosis   Acute respiratory failure with hypoxia and hypercapnia (HCC)   Acute on chronic diastolic heart failure (HCC)   Mitral regurgitation  Acute on chronic hypoxemic and hypercapnic respiratory failure, multifactorial. Acute on chronic diastolic congestive heart failure, severe aortic stenosis.  Untreated sleep apnea. -Currently using oxygen in the daytime, BiPAP at night.  Patient does have BiPAP at home. Use oxygen to keep saturation more than 90%.  Consistently use BiPAP at night.  Patient will use his CPAP at night when he goes home. -Patient is diuresed with IV Lasix and did fairly well and now converted to oral Lasix.  Aldactone.   -Surgical plan for aortic stenosis as below.  Severe aortic stenosis, symptomatic with shortness of breath, presyncope.  TAVR work-up on progress.   Scheduled for TAVR next week 06/07/2021. He will have inpatient procedure, reevaluation for SNF placement.    DVT prophylaxis: enoxaparin (LOVENOX) injection 40 mg Start: 06/01/21 2200 SCDs Start: 05/27/21 2310   Code Status: Full code Family Communication: none today. Disposition Plan: Status is: Inpatient  Remains inpatient appropriate because: Inpatient  procedures planned   Consultants:  Cardiology Pulmonary   Procedures:  None  Antimicrobials:  None   Subjective:  Patient seen and examined.  No overnight events.  Pleasant.  Denies any complaints.  Objective: Vitals:   06/03/21 2115 06/04/21 0357 06/04/21 0726 06/04/21 0844  BP: (!) 112/58 131/65 140/88   Pulse: 71 66 72   Resp: 18 15 18    Temp: 97.7 F (36.5 C) 97.7 F (36.5 C) 97.9 F (36.6 C)   TempSrc: Oral Oral Oral   SpO2: 100% 100% 100% 93%  Weight:  97.1 kg    Height:        Intake/Output Summary (Last 24 hours) at 06/04/2021 1355 Last data filed at 06/04/2021 1214 Gross per 24 hour  Intake 1520 ml  Output 1850 ml  Net -330 ml   Filed Weights   06/02/21 0006 06/03/21 0326 06/04/21 0357  Weight: 94.9 kg 96 kg 97.1 kg    Examination:  General: Looks fairly comfortable on 3 L of oxygen.  Sitting in a chair.  Chronically sick looking.  Debilitated. Cardiovascular: S1-S2 normal.  Regular rate rhythm.  Systolic murmur aortic area. Respiratory: Bilateral clear.  On 3 L oxygen. Gastrointestinal: Soft.  Nontender.  Bowel sound present. Ext: No edema.    Data Reviewed: I have personally reviewed following labs and imaging studies  CBC: Recent Labs  Lab 05/31/21 1320 05/31/21 1323  HGB 16.3 17.0  HCT 48.0 89.2   Basic Metabolic Panel: Recent Labs  Lab 05/29/21 0015 05/30/21 0705 05/31/21 0419 05/31/21 1320 05/31/21 1323 06/01/21 0824 06/02/21 0351 06/03/21 0933  NA  143 141 139 138 137 136 133* 138  K 4.6 4.3 4.8 4.2 4.5 4.4 4.7 4.4  CL 96* 91* 87*  --   --  86* 85* 91*  CO2 42* 44* 45*  --   --  43* 40* 39*  GLUCOSE 127* 90 99  --   --  138* 92 115*  BUN 22 22 32*  --   --  33* 40* 36*  CREATININE 1.16 0.73 0.84  --   --  0.85 1.11 0.80  CALCIUM 8.2* 8.3* 8.5*  --   --  8.5* 8.3* 8.6*  MG 2.3 2.2  --   --   --   --   --   --    GFR: Estimated Creatinine Clearance: 87 mL/min (by C-G formula based on SCr of 0.8 mg/dL). Liver  Function Tests: No results for input(s): AST, ALT, ALKPHOS, BILITOT, PROT, ALBUMIN in the last 168 hours.  No results for input(s): LIPASE, AMYLASE in the last 168 hours. No results for input(s): AMMONIA in the last 168 hours. Coagulation Profile: No results for input(s): INR, PROTIME in the last 168 hours. Cardiac Enzymes: No results for input(s): CKTOTAL, CKMB, CKMBINDEX, TROPONINI in the last 168 hours. BNP (last 3 results) No results for input(s): PROBNP in the last 8760 hours. HbA1C: No results for input(s): HGBA1C in the last 72 hours. CBG: Recent Labs  Lab 06/03/21 1111 06/03/21 1549 06/03/21 2116 06/04/21 0545 06/04/21 1100  GLUCAP 144* 104* 150* 97 107*   Lipid Profile: No results for input(s): CHOL, HDL, LDLCALC, TRIG, CHOLHDL, LDLDIRECT in the last 72 hours.  Thyroid Function Tests: No results for input(s): TSH, T4TOTAL, FREET4, T3FREE, THYROIDAB in the last 72 hours. Anemia Panel: No results for input(s): VITAMINB12, FOLATE, FERRITIN, TIBC, IRON, RETICCTPCT in the last 72 hours. Sepsis Labs: No results for input(s): PROCALCITON, LATICACIDVEN in the last 168 hours.  Recent Results (from the past 240 hour(s))  Resp Panel by RT-PCR (Flu A&B, Covid) Nasopharyngeal Swab     Status: None   Collection Time: 05/27/21  2:08 PM   Specimen: Nasopharyngeal Swab; Nasopharyngeal(NP) swabs in vial transport medium  Result Value Ref Range Status   SARS Coronavirus 2 by RT PCR NEGATIVE NEGATIVE Final    Comment: (NOTE) SARS-CoV-2 target nucleic acids are NOT DETECTED.  The SARS-CoV-2 RNA is generally detectable in upper respiratory specimens during the acute phase of infection. The lowest concentration of SARS-CoV-2 viral copies this assay can detect is 138 copies/mL. A negative result does not preclude SARS-Cov-2 infection and should not be used as the sole basis for treatment or other patient management decisions. A negative result may occur with  improper specimen  collection/handling, submission of specimen other than nasopharyngeal swab, presence of viral mutation(s) within the areas targeted by this assay, and inadequate number of viral copies(<138 copies/mL). A negative result must be combined with clinical observations, patient history, and epidemiological information. The expected result is Negative.  Fact Sheet for Patients:  EntrepreneurPulse.com.au  Fact Sheet for Healthcare Providers:  IncredibleEmployment.be  This test is no t yet approved or cleared by the [place] FDA and  has been authorized for detection and/or diagnosis of SARS-CoV-2 by FDA under an Emergency Use Authorization (EUA). This EUA will remain  in effect (meaning this test can be used) for the duration of the COVID-19 declaration under Section 564(b)(1) of the Act, 21 U.S.C.section 360bbb-3(b)(1), unless the authorization is terminated  or revoked sooner.       Influenza  A by PCR NEGATIVE NEGATIVE Final   Influenza B by PCR NEGATIVE NEGATIVE Final    Comment: (NOTE) The Xpert Xpress SARS-CoV-2/FLU/RSV plus assay is intended as an aid in the diagnosis of influenza from Nasopharyngeal swab specimens and should not be used as a sole basis for treatment. Nasal washings and aspirates are unacceptable for Xpert Xpress SARS-CoV-2/FLU/RSV testing.  Fact Sheet for Patients: EntrepreneurPulse.com.au  Fact Sheet for Healthcare Providers: IncredibleEmployment.be  This test is not yet approved or cleared by the [place] FDA and has been authorized for detection and/or diagnosis of SARS-CoV-2 by FDA under an Emergency Use Authorization (EUA). This EUA will remain in effect (meaning this test can be used) for the duration of the COVID-19 declaration under Section 564(b)(1) of the Act, 21 U.S.C. section 360bbb-3(b)(1), unless the authorization is terminated or revoked.  Performed at [facility], [place] [address], [place], [place] [address]          Radiology Studies: No results found.      Scheduled Meds:  aspirin EC  81 mg Oral Daily   enoxaparin (LOVENOX) injection  40 mg Subcutaneous QHS   feeding supplement  237 mL Oral BID BM   finasteride  5 mg Oral Daily   fluticasone furoate-vilanterol  1 puff Inhalation Daily   furosemide  40 mg Oral Daily   gabapentin  300 mg Oral BID   insulin aspart  0-5 Units Subcutaneous QHS   insulin aspart  0-9 Units Subcutaneous TID WC   ipratropium-albuterol  3 mL Nebulization TID   mirabegron ER  25 mg Oral Daily   rosuvastatin  20 mg Oral Daily   sodium chloride flush  3 mL Intravenous Q12H   tamsulosin  0.4 mg Oral QPC supper   Continuous Infusions:  sodium chloride       LOS: 8 days    Time spent: 25 minutes    Barb Merino, MD [facility] Pager [phone]

## 2021-06-05 DIAGNOSIS — Z720 Tobacco use: Secondary | ICD-10-CM | POA: Diagnosis not present

## 2021-06-05 DIAGNOSIS — J9601 Acute respiratory failure with hypoxia: Secondary | ICD-10-CM | POA: Diagnosis not present

## 2021-06-05 DIAGNOSIS — I5033 Acute on chronic diastolic (congestive) heart failure: Secondary | ICD-10-CM | POA: Diagnosis not present

## 2021-06-05 DIAGNOSIS — I340 Nonrheumatic mitral (valve) insufficiency: Secondary | ICD-10-CM | POA: Diagnosis not present

## 2021-06-05 DIAGNOSIS — J9602 Acute respiratory failure with hypercapnia: Secondary | ICD-10-CM | POA: Diagnosis not present

## 2021-06-05 DIAGNOSIS — I509 Heart failure, unspecified: Secondary | ICD-10-CM | POA: Diagnosis not present

## 2021-06-05 DIAGNOSIS — I350 Nonrheumatic aortic (valve) stenosis: Secondary | ICD-10-CM | POA: Diagnosis not present

## 2021-06-05 LAB — GLUCOSE, CAPILLARY
Glucose-Capillary: 101 mg/dL — ABNORMAL HIGH (ref 70–99)
Glucose-Capillary: 107 mg/dL — ABNORMAL HIGH (ref 70–99)
Glucose-Capillary: 121 mg/dL — ABNORMAL HIGH (ref 70–99)
Glucose-Capillary: 123 mg/dL — ABNORMAL HIGH (ref 70–99)

## 2021-06-05 MED ORDER — IPRATROPIUM-ALBUTEROL 0.5-2.5 (3) MG/3ML IN SOLN
3.0000 mL | Freq: Two times a day (BID) | RESPIRATORY_TRACT | Status: DC
  Administered 2021-06-05 – 2021-06-10 (×9): 3 mL via RESPIRATORY_TRACT
  Filled 2021-06-05 (×10): qty 3

## 2021-06-05 NOTE — Progress Notes (Signed)
Pt placed on Bipap for the night, right after that pt stated he wanted something to eat and will go back on Bipap afterward.  Pt placed back on 3L [place]

## 2021-06-05 NOTE — Progress Notes (Signed)
Progress Note  Patient Name: Kyle Lewis Date of Encounter: 06/05/2021  [facility] Cardiologist: Jenean Lindau, MD   Subjective   Feeling well.  Denies chest pain or shortness of breath.  Inpatient Medications    Scheduled Meds:  aspirin EC  81 mg Oral Daily   enoxaparin (LOVENOX) injection  40 mg Subcutaneous QHS   feeding supplement  237 mL Oral BID BM   finasteride  5 mg Oral Daily   fluticasone furoate-vilanterol  1 puff Inhalation Daily   furosemide  40 mg Oral Daily   gabapentin  300 mg Oral BID   insulin aspart  0-5 Units Subcutaneous QHS   insulin aspart  0-9 Units Subcutaneous TID WC   ipratropium-albuterol  3 mL Nebulization TID   mirabegron ER  25 mg Oral Daily   rosuvastatin  20 mg Oral Daily   sodium chloride flush  3 mL Intravenous Q12H   tamsulosin  0.4 mg Oral QPC supper   Continuous Infusions:  sodium chloride     PRN Meds: sodium chloride, acetaminophen **OR** acetaminophen, iohexol, ondansetron **OR** ondansetron (ZOFRAN) IV, sodium chloride flush   Vital Signs    Vitals:   06/05/21 0512 06/05/21 0519 06/05/21 0806 06/05/21 0808  BP: (!) 108/48  (!) 148/84   Pulse: 76  70 71  Resp: 19   (!) 21  Temp: 97.8 F (36.6 C)  97.8 F (36.6 C)   TempSrc: Oral  Oral   SpO2: (!) 84%  96% 100%  Weight:  96.4 kg    Height:        Intake/Output Summary (Last 24 hours) at 06/05/2021 0852 Last data filed at 06/05/2021 0517 Gross per 24 hour  Intake 840 ml  Output 2200 ml  Net -1360 ml   Last 3 Weights 06/05/2021 06/04/2021 06/03/2021  Weight (lbs) 212 lb 8.4 oz 214 lb 1.1 oz 211 lb 10.3 oz  Weight (kg) 96.4 kg 97.1 kg 96 kg      Telemetry    Sinus rhythm.  No events.  - Personally Reviewed  ECG    N/a - Personally Reviewed  Physical Exam   GEN: No acute distress.   Neck: No JVD Cardiac: RRR, Late-peaking III/VI systolic murmur at the LUSB, rubs, or gallops.  Respiratory: Clear to auscultation bilaterally. GI: Soft, nontender,  non-distended  MS: Trace bilateral LE edema; No deformity. Neuro:  Nonfocal  Psych: Normal affect   Labs    High Sensitivity Troponin:   Recent Labs  Lab 05/27/21 1420 05/27/21 1904  TROPONINIHS 58* 54*     Chemistry Recent Labs  Lab 05/30/21 0705 05/31/21 0419 06/01/21 0824 06/02/21 0351 06/03/21 0933  NA 141   < > 136 133* 138  K 4.3   < > 4.4 4.7 4.4  CL 91*   < > 86* 85* 91*  CO2 44*   < > 43* 40* 39*  GLUCOSE 90   < > 138* 92 115*  BUN 22   < > 33* 40* 36*  CREATININE 0.73   < > 0.85 1.11 0.80  CALCIUM 8.3*   < > 8.5* 8.3* 8.6*  MG 2.2  --   --   --   --   GFRNONAA >60   < > >60 >60 >60  ANIONGAP 6   < > 7 8 8    < > = values in this interval not displayed.    Lipids  Recent Labs  Lab 05/31/21 0419  CHOL 159  TRIG  64  HDL 58  LDLCALC 88  CHOLHDL 2.7    Hematology Recent Labs  Lab 05/31/21 1320 05/31/21 1323  HGB 16.3 17.0  HCT 48.0 50.0   Thyroid No results for input(s): TSH, FREET4 in the last 168 hours.  BNPNo results for input(s): BNP, PROBNP in the last 168 hours.  DDimer No results for input(s): DDIMER in the last 168 hours.   Radiology    No results found.  Cardiac Studies   Echocardiogram 05/28/21:   1. Left ventricular ejection fraction, by estimation, is approximately  55%. The left ventricle has normal function. Left ventricular endocardial  border not optimally defined to evaluate regional wall motion. There is  severe concentric left ventricular  hypertrophy. Left ventricular diastolic parameters are consistent with  Grade I diastolic dysfunction (impaired relaxation). Elevated left  ventricular end-diastolic pressure.   2. Right ventricular systolic function is normal. The right ventricular  size is normal. There is mildly elevated pulmonary artery systolic  pressure. The estimated right ventricular systolic pressure is 32.9 mmHg.   3. Left atrial size was mildly dilated.   4. Right atrial size was mildly dilated.   5.  There is trivial pericardial effusion surrounding the apex.   6. The mitral valve is degenerative. Moderate mitral valve regurgitation  and very eccentric. The mean mitral valve gradient is 3.0 mmHg. Moderate  mitral annular calcification.   7. Tricuspid valve regurgitation is moderate.   8. The aortic valve is tricuspid. There is severe calcifcation of the  aortic valve. Aortic valve regurgitation is not visualized. Severe aortic  valve stenosis. Aortic valve mean gradient measures 56.5 mmHg.  Dimentionless index 0.19.   9. The inferior vena cava is dilated in size with <50% respiratory  variability, suggesting right atrial pressure of 15 mmHg.   Comparison(s): Prior images reviewed side by side. Aortic stenosis has  progressed and in severe range. Mitral regurgitation is very eccentric and  likely moderate.    R/LHC 05/31/21:     Prox LAD lesion is 25% stenosed.   Dist Cx lesion is 40% stenosed.   Ost 2nd Mrg to 2nd Mrg lesion is 70% stenosed.   Dist RCA lesion is 60% stenosed.   Ost RPDA to RPDA lesion is 40% stenosed.   The LAD is a large caliber vessel that courses to the apex. There is mild mid LAD stenosis The Circumflex is a large caliber vessel that gives off a small obtuse marginal branch then 2 moderate caliber obtuse marginal branches. The second obtuse marginal branch has a moderate stenosis, unchanged from last cath in 2019.  The RCA is a large dominant artery. The distal RCA has a moderate stenosis, unchanged from last cath in 2019.  Severe aortic stenosis (mean gradient 50.6 mmHg, 59 mmHg, AVA 0.93 cm2).    Recommendations: Will continue workup for TAVR. His right and left heart pressures are only mildly abnormal following inpatient diuresis. Continue diuresis. The structural heart team will provide a formal consultation regarding his candidacy for TAVR.    CTA 06/01/21: IMPRESSION: 1. 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   2. Annular measurements  support a 29 mm S3 TAVR (574 mm2).   3. No significant annular or subannular calcifications.   4. Shallow left main height concerning for increased risk for coronary obstruction (9.2 mm).   5. Optimal Fluoroscopic Angle for Delivery: RAO 3 CAU 15   6. Dilated pulmonary artery suggestive of pulmonary hypertension.   CT chest 06/01/21:   IMPRESSION:  1. Vascular findings and measurements pertinent to potential TAVR procedure, as detailed. 2. Diffuse thickening and coarse calcification of the aortic valve, compatible with reported aortic stenosis. 3. Borderline mild cardiomegaly. Three-vessel coronary atherosclerosis. 4. Small dependent bilateral pleural effusions. 5. Severe centrilobular emphysema with diffuse bronchial wall thickening and saber sheath trachea, suggesting COPD. 6. Hypodense exophytic 1.6 cm anterior interpolar left renal cortical lesion, indeterminate for renal neoplasm, mildly increased from 1.2 cm on 10/25/2018 MRI, not well evaluated on prior MRI due to artifact. Suggest attention on follow-up renal mass protocol CT abdomen without and with IV contrast in 3-6 months. 7. Cholelithiasis. 8. Marked diffuse colonic diverticulosis. 9. Stable left adrenal adenoma. 10. 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 11. Aortic Atherosclerosis (ICD10-I70.0) and Emphysema (ICD10-J43.9).   Orthopantogram 06/01/21:   FINDINGS: Patient motion blurs the central incisors. Dental fillings are present in the upper and lower molars bilaterally. No significant dental caries are present. No significant periapical lucencies are present. The mandible is intact and located.   IMPRESSION: 1. Dental fillings in the upper and lower molars bilaterally. 2. No significant dental caries.   Patient Profile     76 y.o. male with nonobstructive CAD, severe aortic  stenosis, abdominal aortic aneurysm status postrepair (2016) carotid stenosis with known right ICA stenosis and 40 to 59% left ICA disease, OSA on CPAP, hypertension, and ongoing tobacco abuse here with severe restenosis planning for TAVR on 06/07/2021.  Assessment & Plan    #Severe arctic stenosis: #Moderate mitral regurgitation: Planning for TAVR on 06/07/2021.  Currently stable.  Hopefully moderate mitral vegetation will improve with fixing his aortic valve.  #Acute hypoxic respiratory failure: #Acute on chronic diastolic heart failure: Multifactorial due to underlying COPD, aortic stenosis, and diastolic heart failure.  He is euvolemic on oral Lasix.  Continue to monitor renal function.  Check BMP in AM.  #Tobacco use: Continue to recommend smoking cessation.  #Nonobstructive CAD: Moderate disease in the RCA and OM2.  Medical management with aspirin and statin.      For questions or updates, please contact [place] Please consult [email or web address] for contact info under        Signed, Skeet Latch, MD  06/05/2021, 8:52 AM

## 2021-06-05 NOTE — Progress Notes (Signed)
Pt declined CPAP tonight. °

## 2021-06-05 NOTE — Progress Notes (Signed)
PROGRESS NOTE    Kyle Lewis  VWU:[id number] DOB: 10-05-1945 DOA: 05/27/2021 PCP: [name], MD   Chief complaint.  Shortness of breath. Brief Narrative:  76 year old with history of COPD, obstructive sleep apnea on CPAP at home, hypertension presented to the ER with worsening shortness of breath for about 2 weeks.  Treated with steroids and antibiotics as outpatient.  On arrival to the ER, he was 94% on 4 L oxygen.  Blood pressure stable.  BNP 1348.  COVID and flu negative.  Admitted to hospital with acute on chronic diastolic heart failure secondary to severe aortic stenosis.   Assessment & Plan:   Principal Problem:   Acute CHF (congestive heart failure) (HCC) Active Problems:   OSA (obstructive sleep apnea)   Exogenous obesity   Tobacco use   Severe aortic stenosis   Acute respiratory failure with hypoxia and hypercapnia (HCC)   Acute on chronic diastolic heart failure (HCC)   Mitral regurgitation  Acute on chronic hypoxemic and hypercapnic respiratory failure, multifactorial. Acute on chronic diastolic congestive heart failure severe aortic stenosis.  Obstructive sleep apnea. Patient volume status has improved, Lasix changed to oral.  Also added Aldactone. Patient has been followed by cardiology, scheduled for TAVR procedure on 06/07/2021. Still on 2 L oxygen, oxygen saturation dropped down when he took his oxygen off.  Patient probably will need home oxygen at time of discharge    DVT prophylaxis: Lovenox Code Status: full Family Communication:  Disposition Plan:    Status is: Inpatient  Remains inpatient appropriate because: Severity of disease and pending procedure.        I/O last 3 completed shifts: In: 1800 [P.O.:1800] Out: 3100 [Urine:3100] Total I/O In: 480 [P.O.:480] Out: 800 [Urine:800]     Consultants:  Cardiology  Procedures:   Antimicrobials: None  Subjective: Patient doing well today, no significant short of breath. He  had a significant hypoxemia this morning when he was taken off oxygen and eating. No cough. No abdominal pain nausea vomiting. No dysuria hematuria.  Objective: Vitals:   06/05/21 0512 06/05/21 0519 06/05/21 0806 06/05/21 0808  BP: (!) 108/48  (!) 148/84   Pulse: 76  70 71  Resp: 19   (!) 21  Temp: 97.8 F (36.6 C)  97.8 F (36.6 C)   TempSrc: Oral  Oral   SpO2: (!) 84%  96% 100%  Weight:  96.4 kg    Height:        Intake/Output Summary (Last 24 hours) at 06/05/2021 1236 Last data filed at 06/05/2021 1126 Gross per 24 hour  Intake 960 ml  Output 2400 ml  Net -1440 ml   Filed Weights   06/03/21 0326 06/04/21 0357 06/05/21 0519  Weight: 96 kg 97.1 kg 96.4 kg    Examination:  General exam: Appears calm and comfortable  Respiratory system: Clear to auscultation. Respiratory effort normal. Cardiovascular system: S1 & S2 heard, RRR. 2/6 SM at RUSB No pedal edema. Gastrointestinal system: Abdomen is nondistended, soft and nontender. No organomegaly or masses felt. Normal bowel sounds heard. Central nervous system: Alert and oriented. No focal neurological deficits. Extremities: Symmetric 5 x 5 power. Skin: No rashes, lesions or ulcers Psychiatry: Judgement and insight appear normal. Mood & affect appropriate.     Data Reviewed: I have personally reviewed following labs and imaging studies  CBC: Recent Labs  Lab 05/31/21 1320 05/31/21 1323  HGB 16.3 17.0  HCT 48.0 29.5   Basic Metabolic Panel: Recent Labs  Lab 05/30/21 0705  05/31/21 0419 05/31/21 1320 05/31/21 1323 06/01/21 0824 06/02/21 0351 06/03/21 0933  NA 141 139 138 137 136 133* 138  K 4.3 4.8 4.2 4.5 4.4 4.7 4.4  CL 91* 87*  --   --  86* 85* 91*  CO2 44* 45*  --   --  43* 40* 39*  GLUCOSE 90 99  --   --  138* 92 115*  BUN 22 32*  --   --  33* 40* 36*  CREATININE 0.73 0.84  --   --  0.85 1.11 0.80  CALCIUM 8.3* 8.5*  --   --  8.5* 8.3* 8.6*  MG 2.2  --   --   --   --   --   --    GFR: Estimated  Creatinine Clearance: 86.7 mL/min (by C-G formula based on SCr of 0.8 mg/dL). Liver Function Tests: No results for input(s): AST, ALT, ALKPHOS, BILITOT, PROT, ALBUMIN in the last 168 hours. No results for input(s): LIPASE, AMYLASE in the last 168 hours. No results for input(s): AMMONIA in the last 168 hours. Coagulation Profile: No results for input(s): INR, PROTIME in the last 168 hours. Cardiac Enzymes: No results for input(s): CKTOTAL, CKMB, CKMBINDEX, TROPONINI in the last 168 hours. BNP (last 3 results) No results for input(s): PROBNP in the last 8760 hours. HbA1C: No results for input(s): HGBA1C in the last 72 hours. CBG: Recent Labs  Lab 06/04/21 1100 06/04/21 1709 06/04/21 2122 06/05/21 0622 06/05/21 1124  GLUCAP 107* 129* 148* 101* 107*   Lipid Profile: No results for input(s): CHOL, HDL, LDLCALC, TRIG, CHOLHDL, LDLDIRECT in the last 72 hours. Thyroid Function Tests: No results for input(s): TSH, T4TOTAL, FREET4, T3FREE, THYROIDAB in the last 72 hours. Anemia Panel: No results for input(s): VITAMINB12, FOLATE, FERRITIN, TIBC, IRON, RETICCTPCT in the last 72 hours. Sepsis Labs: No results for input(s): PROCALCITON, LATICACIDVEN in the last 168 hours.  Recent Results (from the past 240 hour(s))  Resp Panel by RT-PCR (Flu A&B, Covid) Nasopharyngeal Swab     Status: None   Collection Time: 05/27/21  2:08 PM   Specimen: Nasopharyngeal Swab; Nasopharyngeal(NP) swabs in vial transport medium  Result Value Ref Range Status   SARS Coronavirus 2 by RT PCR NEGATIVE NEGATIVE Final    Comment: (NOTE) SARS-CoV-2 target nucleic acids are NOT DETECTED.  The SARS-CoV-2 RNA is generally detectable in upper respiratory specimens during the acute phase of infection. The lowest concentration of SARS-CoV-2 viral copies this assay can detect is 138 copies/mL. A negative result does not preclude SARS-Cov-2 infection and should not be used as the sole basis for treatment or other  patient management decisions. A negative result may occur with  improper specimen collection/handling, submission of specimen other than nasopharyngeal swab, presence of viral mutation(s) within the areas targeted by this assay, and inadequate number of viral copies(<138 copies/mL). A negative result must be combined with clinical observations, patient history, and epidemiological information. The expected result is Negative.  Fact Sheet for Patients:  EntrepreneurPulse.com.au  Fact Sheet for Healthcare Providers:  IncredibleEmployment.be  This test is no t yet approved or cleared by the [place] FDA and  has been authorized for detection and/or diagnosis of SARS-CoV-2 by FDA under an Emergency Use Authorization (EUA). This EUA will remain  in effect (meaning this test can be used) for the duration of the COVID-19 declaration under Section 564(b)(1) of the Act, 21 U.S.C.section 360bbb-3(b)(1), unless the authorization is terminated  or revoked sooner.  Influenza A by PCR NEGATIVE NEGATIVE Final   Influenza B by PCR NEGATIVE NEGATIVE Final    Comment: (NOTE) The Xpert Xpress SARS-CoV-2/FLU/RSV plus assay is intended as an aid in the diagnosis of influenza from Nasopharyngeal swab specimens and should not be used as a sole basis for treatment. Nasal washings and aspirates are unacceptable for Xpert Xpress SARS-CoV-2/FLU/RSV testing.  Fact Sheet for Patients: EntrepreneurPulse.com.au  Fact Sheet for Healthcare Providers: IncredibleEmployment.be  This test is not yet approved or cleared by the [place] FDA and has been authorized for detection and/or diagnosis of SARS-CoV-2 by FDA under an Emergency Use Authorization (EUA). This EUA will remain in effect (meaning this test can be used) for the duration of the COVID-19 declaration under Section 564(b)(1) of the Act, 21 U.S.C. section  360bbb-3(b)(1), unless the authorization is terminated or revoked.  Performed at [facility], [facility] [address] [place], [place] [address]          Radiology Studies: No results found.      Scheduled Meds:  aspirin EC  81 mg Oral Daily   enoxaparin (LOVENOX) injection  40 mg Subcutaneous QHS   feeding supplement  237 mL Oral BID BM   finasteride  5 mg Oral Daily   fluticasone furoate-vilanterol  1 puff Inhalation Daily   furosemide  40 mg Oral Daily   gabapentin  300 mg Oral BID   insulin aspart  0-5 Units Subcutaneous QHS   insulin aspart  0-9 Units Subcutaneous TID WC   ipratropium-albuterol  3 mL Nebulization TID   mirabegron ER  25 mg Oral Daily   rosuvastatin  20 mg Oral Daily   sodium chloride flush  3 mL Intravenous Q12H   tamsulosin  0.4 mg Oral QPC supper   Continuous Infusions:  sodium chloride       LOS: 9 days    Time spent: 28 minutes    Sharen Hones, MD [facility]   To contact the attending provider between 7A-7P or the covering provider during after hours 7P-7A, please log into the web site [email or web address] and access using universal [place] password for that web site. If you do not have the password, please call the hospital operator.  06/05/2021, 12:36 PM

## 2021-06-05 NOTE — Plan of Care (Signed)
  Problem: Education: Goal: Ability to demonstrate management of disease process will improve Outcome: Progressing Goal: Ability to verbalize understanding of medication therapies will improve Outcome: Progressing   

## 2021-06-06 ENCOUNTER — Inpatient Hospital Stay (HOSPITAL_COMMUNITY): Payer: Medicare Other

## 2021-06-06 DIAGNOSIS — Z720 Tobacco use: Secondary | ICD-10-CM | POA: Diagnosis not present

## 2021-06-06 DIAGNOSIS — I340 Nonrheumatic mitral (valve) insufficiency: Secondary | ICD-10-CM | POA: Diagnosis not present

## 2021-06-06 DIAGNOSIS — J9602 Acute respiratory failure with hypercapnia: Secondary | ICD-10-CM | POA: Diagnosis not present

## 2021-06-06 DIAGNOSIS — I350 Nonrheumatic aortic (valve) stenosis: Secondary | ICD-10-CM | POA: Diagnosis not present

## 2021-06-06 DIAGNOSIS — I5033 Acute on chronic diastolic (congestive) heart failure: Secondary | ICD-10-CM | POA: Diagnosis not present

## 2021-06-06 DIAGNOSIS — J9601 Acute respiratory failure with hypoxia: Secondary | ICD-10-CM | POA: Diagnosis not present

## 2021-06-06 LAB — APTT: aPTT: 26 seconds (ref 24–36)

## 2021-06-06 LAB — GLUCOSE, CAPILLARY
Glucose-Capillary: 125 mg/dL — ABNORMAL HIGH (ref 70–99)
Glucose-Capillary: 106 mg/dL — ABNORMAL HIGH (ref 70–99)

## 2021-06-06 LAB — CBC
HCT: 44.9 % (ref 39.0–52.0)
Hemoglobin: 14.8 g/dL (ref 13.0–17.0)
MCH: 31.1 pg (ref 26.0–34.0)
MCHC: 33 g/dL (ref 30.0–36.0)
MCV: 94.3 fL (ref 80.0–100.0)
Platelets: 147 10*3/uL — ABNORMAL LOW (ref 150–400)
RBC: 4.76 MIL/uL (ref 4.22–5.81)
RDW: 13.5 % (ref 11.5–15.5)
WBC: 6.6 10*3/uL (ref 4.0–10.5)
nRBC: 0 % (ref 0.0–0.2)

## 2021-06-06 LAB — BASIC METABOLIC PANEL
Anion gap: 7 (ref 5–15)
Anion gap: 8 (ref 5–15)
BUN: 23 mg/dL (ref 8–23)
BUN: 24 mg/dL — ABNORMAL HIGH (ref 8–23)
CO2: 36 mmol/L — ABNORMAL HIGH (ref 22–32)
CO2: 33 mmol/L — ABNORMAL HIGH (ref 22–32)
Calcium: 8.5 mg/dL — ABNORMAL LOW (ref 8.9–10.3)
Calcium: 8.7 mg/dL — ABNORMAL LOW (ref 8.9–10.3)
Chloride: 94 mmol/L — ABNORMAL LOW (ref 98–111)
Chloride: 95 mmol/L — ABNORMAL LOW (ref 98–111)
Creatinine, Ser: 0.89 mg/dL (ref 0.61–1.24)
Creatinine, Ser: 0.86 mg/dL (ref 0.61–1.24)
GFR, Estimated: >60 mL/min (ref >60)
GFR, Estimated: >60 mL/min (ref >60)
Glucose, Bld: 107 mg/dL — ABNORMAL HIGH (ref 70–99)
Glucose, Bld: 118 mg/dL — ABNORMAL HIGH (ref 70–99)
Potassium: 4.4 mmol/L (ref 3.5–5.1)
Potassium: 4.6 mmol/L (ref 3.5–5.1)
Sodium: 137 mmol/L (ref 135–145)
Sodium: 136 mmol/L (ref 135–145)

## 2021-06-06 LAB — PROTIME-INR
INR: 0.9 (ref 0.8–1.2)
Prothrombin Time: 12.5 seconds (ref 11.4–15.2)

## 2021-06-06 LAB — TYPE AND SCREEN
ABO/RH(D): O POS
Antibody Screen: NEGATIVE

## 2021-06-06 LAB — BLOOD GAS, ARTERIAL
Acid-Base Excess: 12.2 mmol/L — ABNORMAL HIGH (ref 0.0–2.0)
Bicarbonate: 37.3 mmol/L — ABNORMAL HIGH (ref 20.0–28.0)
Drawn by: 51155
FIO2: 32
O2 Saturation: 96.1 %
Patient temperature: 37.1
pCO2 arterial: 58.4 mmHg — ABNORMAL HIGH (ref 32.0–48.0)
pH, Arterial: 7.421 (ref 7.350–7.450)
pO2, Arterial: 79.3 mmHg — ABNORMAL LOW (ref 83.0–108.0)

## 2021-06-06 LAB — HEMOGLOBIN A1C
Hgb A1c MFr Bld: 5.5 % (ref 4.8–5.6)
Mean Plasma Glucose: 111.15 mg/dL

## 2021-06-06 MED ORDER — CHLORHEXIDINE GLUCONATE CLOTH 2 % EX PADS
6.0000 | MEDICATED_PAD | Freq: Once | CUTANEOUS | Status: AC
  Administered 2021-06-07: 6 via TOPICAL

## 2021-06-06 MED ORDER — BISACODYL 5 MG PO TBEC
5.0000 mg | DELAYED_RELEASE_TABLET | Freq: Once | ORAL | Status: AC
  Administered 2021-06-06: 5 mg via ORAL
  Filled 2021-06-06: qty 1

## 2021-06-06 MED ORDER — CHLORHEXIDINE GLUCONATE CLOTH 2 % EX PADS
6.0000 | MEDICATED_PAD | Freq: Once | CUTANEOUS | Status: AC
  Administered 2021-06-06: 6 via TOPICAL

## 2021-06-06 MED ORDER — POTASSIUM CHLORIDE 2 MEQ/ML IV SOLN
80.0000 meq | INTRAVENOUS | Status: DC
  Filled 2021-06-06: qty 40

## 2021-06-06 MED ORDER — DEXMEDETOMIDINE HCL IN NACL 400 MCG/100ML IV SOLN
0.1000 ug/kg/h | INTRAVENOUS | Status: DC
  Filled 2021-06-06: qty 100

## 2021-06-06 MED ORDER — CHLORHEXIDINE GLUCONATE CLOTH 2 % EX PADS: 6.0000 | MEDICATED_PAD | Freq: Once | CUTANEOUS | Status: DC

## 2021-06-06 MED ORDER — CHLORHEXIDINE GLUCONATE 0.12 % MT SOLN
15.0000 mL | Freq: Once | OROMUCOSAL | Status: AC
  Administered 2021-06-07: 15 mL via OROMUCOSAL
  Filled 2021-06-06: qty 15

## 2021-06-06 MED ORDER — VANCOMYCIN HCL 1500 MG/300ML IV SOLN
1500.0000 mg | INTRAVENOUS | Status: DC
  Filled 2021-06-06: qty 300

## 2021-06-06 MED ORDER — CHLORHEXIDINE GLUCONATE 0.12 % MT SOLN: 15.0000 mL | Freq: Once | OROMUCOSAL | Status: AC

## 2021-06-06 MED ORDER — NOREPINEPHRINE 4 MG/250ML-% IV SOLN
0.0000 ug/min | INTRAVENOUS | Status: DC
  Filled 2021-06-06: qty 250

## 2021-06-06 MED ORDER — HEPARIN 30,000 UNITS/1000 ML (OHS) CELLSAVER SOLUTION
Status: DC
  Filled 2021-06-06: qty 1000

## 2021-06-06 MED ORDER — VANCOMYCIN HCL 1500 MG/300ML IV SOLN
1500.0000 mg | INTRAVENOUS | Status: AC
  Administered 2021-06-07: 1500 mg via INTRAVENOUS
  Filled 2021-06-06: qty 300

## 2021-06-06 MED ORDER — VANCOMYCIN HCL IN DEXTROSE 1-5 GM/200ML-% IV SOLN
1000.0000 mg | INTRAVENOUS | Status: DC
  Filled 2021-06-06: qty 200

## 2021-06-06 MED ORDER — TEMAZEPAM 15 MG PO CAPS
15.0000 mg | ORAL_CAPSULE | Freq: Once | ORAL | Status: AC | PRN
  Administered 2021-06-07: 15 mg via ORAL
  Filled 2021-06-06: qty 1

## 2021-06-06 MED ORDER — MAGNESIUM SULFATE 50 % IJ SOLN
40.0000 meq | INTRAMUSCULAR | Status: DC
  Filled 2021-06-06: qty 9.85

## 2021-06-06 MED ORDER — VANCOMYCIN HCL 1500 MG/300ML IV SOLN
1500.0000 mg | Freq: Once | INTRAVENOUS | Status: DC
  Filled 2021-06-06: qty 300

## 2021-06-06 NOTE — Progress Notes (Signed)
Pt placed on Bipap for the night.  

## 2021-06-06 NOTE — Progress Notes (Signed)
Pt asked to be taken off bipap to sit on recliner.  Offered pt to go back on Bipap while on recliner, pt stated he doesn't want to wear BIPAP. Pt place back on 3L [place]

## 2021-06-06 NOTE — Progress Notes (Signed)
OT Cancellation Note  Patient Details Name: TALON REGALA MRN: [id number] DOB: 1945/10/15   Cancelled Treatment:    Reason Eval/Treat Not Completed: Patient declined, no reason specified- pt declined reports not ready to get up yet.  Will follow and see as able.  Jolaine Artist, OT Acute Rehabilitation Services Pager [phone] Office [phone]   Delight Stare 06/06/2021, 9:05 AM

## 2021-06-06 NOTE — Progress Notes (Signed)
Physical Therapy Treatment Patient Details Name: Kyle Lewis MRN: [id number] DOB: 08/30/1945 Today's Date: 06/06/2021   History of Present Illness 76 y.o. male admitted 05/27/21 with worsening SOB and hypoxia. Workup revealed severe AS; now undergoing TAVR workup. Course complicated by hypercardbic respiratory failure. PMH includes CAD, AAA s/p repair, CKD 3, AS, OSA on CPAP, COPD, HTN, CVA.    PT Comments    Pt was seen for mobility today and encouraged him to get OOB and replace O2 which was in bed.  Pt was down to 81% and even once on O2 recovered only to 92%.  Took steps to chair and then was seated with safety features on in the chair. Has a plan for home with family assistance, and will recommend him to do this only if 24/7 is there.  Has difficulty with recall of safety and tends to get a bit sidetracked with details of things he wants to be done.  Follow acute PT goals, focus on LE strength, control of O2 sats and being OOB to chair to increase his ability to walk on RW with or without O2.   Recommendations for follow up therapy are one component of a multi-disciplinary discharge planning process, led by the attending physician.  Recommendations may be updated based on patient status, additional functional criteria and insurance authorization.  Follow Up Recommendations  Home health PT     Assistance Recommended at Discharge Intermittent Supervision/Assistance  Equipment Recommendations  None recommended by PT    Recommendations for Other Services       Precautions / Restrictions Precautions Precautions: Fall Precaution Comments: Watch SpO2 (not on home O2) Restrictions Weight Bearing Restrictions: No     Mobility  Bed Mobility Overal bed mobility: Needs Assistance Bed Mobility: Supine to Sit     Supine to sit: Supervision     General bed mobility comments: agreeable to get OOB to recliner with some convincing    Transfers Overall transfer level: Needs  assistance Equipment used: Rolling walker (2 wheels) Transfers: Sit to/from Stand Sit to Stand: Supervision                Ambulation/Gait Ambulation/Gait assistance: Min guard Gait Distance (Feet): 4 Feet Assistive device: Rolling walker (2 wheels) Gait Pattern/deviations: Step-through pattern;Decreased stride length;Trunk flexed Gait velocity: Decreased Gait velocity interpretation: <1.31 ft/sec, indicative of household ambulator Pre-gait activities: balance cues and instruction for posture and safety with lines General Gait Details: cues for hand placement and sequence with lines   Stairs             Wheelchair Mobility    Modified Rankin (Stroke Patients Only)       Balance Overall balance assessment: Needs assistance Sitting-balance support: Feet supported Sitting balance-Leahy Scale: Fair     Standing balance support: Bilateral upper extremity supported;During functional activity Standing balance-Leahy Scale: Poor Standing balance comment: requires RW for steadying to chair and then let go without being completely transferred there.  Reminders for safety                            Cognition Arousal/Alertness: Awake/alert;Lethargic Behavior During Therapy: WFL for tasks assessed/performed Overall Cognitive Status: No family/caregiver present to determine baseline cognitive functioning                                 General Comments: has been in bed tired, hypoxic and taking  off O2.        Exercises      General Comments General comments (skin integrity, edema, etc.): pt is wearing no O2 when PT arrived, declining walk but also hypoxic.  Replaced cannula but pt only got to 92% during the time PT was there      Pertinent Vitals/Pain Pain Assessment: No/denies pain    Home Living                          Prior Function            PT Goals (current goals can now be found in the care plan section) Acute  Rehab PT Goals Patient Stated Goal: go home    Frequency    Min 3X/week      PT Plan Current plan remains appropriate    Co-evaluation              AM-PAC PT "6 Clicks" Mobility   Outcome Measure  Help needed turning from your back to your side while in a flat bed without using bedrails?: A Little Help needed moving from lying on your back to sitting on the side of a flat bed without using bedrails?: A Little Help needed moving to and from a bed to a chair (including a wheelchair)?: A Little Help needed standing up from a chair using your arms (e.g., wheelchair or bedside chair)?: A Little Help needed to walk in hospital room?: A Little Help needed climbing 3-5 steps with a railing? : A Lot 6 Click Score: 17    End of Session Equipment Utilized During Treatment: Gait belt;Oxygen Activity Tolerance: Patient limited by fatigue;Treatment limited secondary to medical complications (Comment) Patient left: with call bell/phone within reach;in chair;with chair alarm set Nurse Communication: Mobility status PT Visit Diagnosis: Other abnormalities of gait and mobility (R26.89);Muscle weakness (generalized) (M62.81)     Time: 7342-8768 PT Time Calculation (min) (ACUTE ONLY): 31 min  Charges:  $Therapeutic Activity: 23-37 mins              Ramond Dial 06/06/2021, 4:27 PM  Mee Hives, PT PhD Acute Rehab Dept. Number: [address] [place]

## 2021-06-06 NOTE — Progress Notes (Signed)
Progress Note  Patient Name: Kyle Lewis Date of Encounter: 06/06/2021  [facility] Cardiologist: Jenean Lindau, MD   Subjective   Feeling well.  Denies chest pain or shortness of breath.  Ready for TAVR tomorrow.   Inpatient Medications    Scheduled Meds:  aspirin EC  81 mg Oral Daily   enoxaparin (LOVENOX) injection  40 mg Subcutaneous QHS   feeding supplement  237 mL Oral BID BM   finasteride  5 mg Oral Daily   fluticasone furoate-vilanterol  1 puff Inhalation Daily   furosemide  40 mg Oral Daily   gabapentin  300 mg Oral BID   insulin aspart  0-5 Units Subcutaneous QHS   insulin aspart  0-9 Units Subcutaneous TID WC   ipratropium-albuterol  3 mL Nebulization BID   [START ON 06/07/2021] magnesium sulfate  40 mEq Other To OR   mirabegron ER  25 mg Oral Daily   [START ON 06/07/2021] potassium chloride  80 mEq Other To OR   rosuvastatin  20 mg Oral Daily   sodium chloride flush  3 mL Intravenous Q12H   tamsulosin  0.4 mg Oral QPC supper   Continuous Infusions:  sodium chloride     [START ON 06/07/2021] dexmedetomidine     [START ON 06/07/2021] heparin 30,000 units/NS 1000 mL solution for CELLSAVER     [START ON 06/07/2021] norepinephrine (LEVOPHED) Adult infusion     [START ON 06/07/2021] vancomycin     PRN Meds: sodium chloride, acetaminophen **OR** acetaminophen, iohexol, ondansetron **OR** ondansetron (ZOFRAN) IV, sodium chloride flush   Vital Signs    Vitals:   06/06/21 0400 06/06/21 0430 06/06/21 0607 06/06/21 0820  BP: 125/81     Pulse: 69 71 65 70  Resp: 16 20 17 17   Temp: 98.6 F (37 C)     TempSrc:      SpO2: 94% 96% 95% 94%  Weight: 97 kg     Height:        Intake/Output Summary (Last 24 hours) at 06/06/2021 1052 Last data filed at 06/06/2021 0805 Gross per 24 hour  Intake 1320 ml  Output 3800 ml  Net -2480 ml   Last 3 Weights 06/06/2021 06/05/2021 06/04/2021  Weight (lbs) 213 lb 12.8 oz 212 lb 8.4 oz 214 lb 1.1 oz  Weight (kg) 96.979 kg 96.4 kg 97.1  kg      Telemetry    Sinus rhythm.  No events.  - Personally Reviewed  ECG    N/a - Personally Reviewed  Physical Exam   GEN: No acute distress.  Resting comfortably. Neck: No JVD Cardiac: RRR, Late-peaking III/VI systolic murmur at the LUSB, rubs, or gallops.  Respiratory: Clear to auscultation bilaterally. GI: Soft, nontender, non-distended  MS: Trace bilateral LE edema; No deformity. Neuro:  Nonfocal  Psych: Normal affect   Labs    High Sensitivity Troponin:   Recent Labs  Lab 05/27/21 1420 05/27/21 1904  TROPONINIHS 58* 54*     Chemistry Recent Labs  Lab 06/02/21 0351 06/03/21 0933 06/06/21 0133  NA 133* 138 137  K 4.7 4.4 4.4  CL 85* 91* 94*  CO2 40* 39* 36*  GLUCOSE 92 115* 107*  BUN 40* 36* 23  CREATININE 1.11 0.80 0.89  CALCIUM 8.3* 8.6* 8.5*  GFRNONAA >60 >60 >60  ANIONGAP 8 8 7     Lipids  Recent Labs  Lab 05/31/21 0419  CHOL 159  TRIG 64  HDL 58  LDLCALC 88  CHOLHDL 2.7  Hematology Recent Labs  Lab 05/31/21 1320 05/31/21 1323  HGB 16.3 17.0  HCT 48.0 50.0   Thyroid No results for input(s): TSH, FREET4 in the last 168 hours.  BNPNo results for input(s): BNP, PROBNP in the last 168 hours.  DDimer No results for input(s): DDIMER in the last 168 hours.   Radiology    No results found.  Cardiac Studies   Echocardiogram 05/28/21:   1. Left ventricular ejection fraction, by estimation, is approximately  55%. The left ventricle has normal function. Left ventricular endocardial  border not optimally defined to evaluate regional wall motion. There is  severe concentric left ventricular  hypertrophy. Left ventricular diastolic parameters are consistent with  Grade I diastolic dysfunction (impaired relaxation). Elevated left  ventricular end-diastolic pressure.   2. Right ventricular systolic function is normal. The right ventricular  size is normal. There is mildly elevated pulmonary artery systolic  pressure. The estimated right  ventricular systolic pressure is 08.6 mmHg.   3. Left atrial size was mildly dilated.   4. Right atrial size was mildly dilated.   5. There is trivial pericardial effusion surrounding the apex.   6. The mitral valve is degenerative. Moderate mitral valve regurgitation  and very eccentric. The mean mitral valve gradient is 3.0 mmHg. Moderate  mitral annular calcification.   7. Tricuspid valve regurgitation is moderate.   8. The aortic valve is tricuspid. There is severe calcifcation of the  aortic valve. Aortic valve regurgitation is not visualized. Severe aortic  valve stenosis. Aortic valve mean gradient measures 56.5 mmHg.  Dimentionless index 0.19.   9. The inferior vena cava is dilated in size with <50% respiratory  variability, suggesting right atrial pressure of 15 mmHg.   Comparison(s): Prior images reviewed side by side. Aortic stenosis has  progressed and in severe range. Mitral regurgitation is very eccentric and  likely moderate.    R/LHC 05/31/21:     Prox LAD lesion is 25% stenosed.   Dist Cx lesion is 40% stenosed.   Ost 2nd Mrg to 2nd Mrg lesion is 70% stenosed.   Dist RCA lesion is 60% stenosed.   Ost RPDA to RPDA lesion is 40% stenosed.   The LAD is a large caliber vessel that courses to the apex. There is mild mid LAD stenosis The Circumflex is a large caliber vessel that gives off a small obtuse marginal branch then 2 moderate caliber obtuse marginal branches. The second obtuse marginal branch has a moderate stenosis, unchanged from last cath in 2019.  The RCA is a large dominant artery. The distal RCA has a moderate stenosis, unchanged from last cath in 2019.  Severe aortic stenosis (mean gradient 50.6 mmHg, 59 mmHg, AVA 0.93 cm2).    Recommendations: Will continue workup for TAVR. His right and left heart pressures are only mildly abnormal following inpatient diuresis. Continue diuresis. The structural heart team will provide a formal consultation regarding his  candidacy for TAVR.    CTA 06/01/21: IMPRESSION: 1. 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   2. Annular measurements support a 29 mm S3 TAVR (574 mm2).   3. No significant annular or subannular calcifications.   4. Shallow left main height concerning for increased risk for coronary obstruction (9.2 mm).   5. Optimal Fluoroscopic Angle for Delivery: RAO 3 CAU 15   6. Dilated pulmonary artery suggestive of pulmonary hypertension.   CT chest 06/01/21:   IMPRESSION: 1. Vascular findings and measurements pertinent to potential TAVR procedure, as detailed. 2.  Diffuse thickening and coarse calcification of the aortic valve, compatible with reported aortic stenosis. 3. Borderline mild cardiomegaly. Three-vessel coronary atherosclerosis. 4. Small dependent bilateral pleural effusions. 5. Severe centrilobular emphysema with diffuse bronchial wall thickening and saber sheath trachea, suggesting COPD. 6. Hypodense exophytic 1.6 cm anterior interpolar left renal cortical lesion, indeterminate for renal neoplasm, mildly increased from 1.2 cm on 10/25/2018 MRI, not well evaluated on prior MRI due to artifact. Suggest attention on follow-up renal mass protocol CT abdomen without and with IV contrast in 3-6 months. 7. Cholelithiasis. 8. Marked diffuse colonic diverticulosis. 9. Stable left adrenal adenoma. 10. 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 11. Aortic Atherosclerosis (ICD10-I70.0) and Emphysema (ICD10-J43.9).   Orthopantogram 06/01/21:   FINDINGS: Patient motion blurs the central incisors. Dental fillings are present in the upper and lower molars bilaterally. No significant dental caries are present. No significant periapical lucencies are present. The mandible is intact and located.   IMPRESSION: 1. Dental fillings in the upper and lower  molars bilaterally. 2. No significant dental caries.   Patient Profile     76 y.o. male with nonobstructive CAD, severe aortic stenosis, abdominal aortic aneurysm status postrepair (2016) carotid stenosis with known right ICA stenosis and 40 to 59% left ICA disease, OSA on CPAP, hypertension, and ongoing tobacco abuse here with severe restenosis planning for TAVR on 06/07/2021.  Assessment & Plan    # Severe arctic stenosis: # Moderate mitral regurgitation: Planning for TAVR tomorrow.  Currently stable.  Hopefully moderate mitral vegetation will improve with fixing his aortic valve.  Will make sure he is NPO.  # Acute hypoxic respiratory failure: # Acute on chronic diastolic heart failure: Multifactorial due to underlying COPD, aortic stenosis, and diastolic heart failure.  He is euvolemic on oral Lasix.  Renal function stable.  He was net -2.1L yesterday.  Renal function stable.  Continue oral lasix.  # Tobacco use: Continue to recommend smoking cessation.  # Nonobstructive CAD: Moderate disease in the RCA and OM2.  Medical management with aspirin and statin.      For questions or updates, please contact [place] Please consult [email or web address] for contact info under        Signed, Skeet Latch, MD  06/06/2021, 10:52 AM

## 2021-06-06 NOTE — Anesthesia Preprocedure Evaluation (Addendum)
Anesthesia Evaluation  Patient identified by MRN, date of birth, ID band Patient awake    Reviewed: Allergy & Precautions, NPO status , Patient's Chart, lab work & pertinent test results  History of Anesthesia Complications Negative for: history of anesthetic complications  Airway Mallampati: II  TM Distance: >3 FB Neck ROM: Full    Dental  (+) Dental Advisory Given, Caps, Missing   Pulmonary sleep apnea (BiPAP) , COPD,  COPD inhaler, former smoker,    breath sounds clear to auscultation       Cardiovascular hypertension, Pt. on medications (-) angina+ CAD (non-obstructive by cath), + Peripheral Vascular Disease (h/o AAA) and +CHF  + Valvular Problems/Murmurs AS  Rhythm:Regular Rate:Normal + Systolic murmurs 94/70/9628 ECHO: EF 55%, severe LVH with normal LVF, Grade 1 DD, mod MR, severe AS with mean grad 56 mmHg  05/31/2021 Cath:  Prox LAD lesion is 25% stenosed.   Dist Cx lesion is 40% stenosed.   Ost 2nd Mrg to 2nd Mrg lesion is 70% stenosed.   Dist RCA lesion is 60% stenosed.   Ost RPDA to RPDA lesion is 40% stenosed    Neuro/Psych Anxiety Depression CVA, No Residual Symptoms    GI/Hepatic   Endo/Other  Morbid obesity  Renal/GU Renal InsufficiencyRenal disease     Musculoskeletal  (+) Arthritis ,   Abdominal (+) + obese,   Peds  Hematology   Anesthesia Other Findings   Reproductive/Obstetrics                            Anesthesia Physical Anesthesia Plan  ASA: 4  Anesthesia Plan: General   Post-op Pain Management: Tylenol PO (pre-op) and Minimal or no pain anticipated   Induction: Intravenous  PONV Risk Score and Plan: 2 and Ondansetron and Dexamethasone  Airway Management Planned: Oral ETT  Additional Equipment: Arterial line  Intra-op Plan:   Post-operative Plan: Extubation in OR  Informed Consent: I have reviewed the patients History and Physical, chart, labs  and discussed the procedure including the risks, benefits and alternatives for the proposed anesthesia with the patient or authorized representative who has indicated his/her understanding and acceptance.     Dental advisory given  Plan Discussed with: CRNA and Surgeon  Anesthesia Plan Comments:        Anesthesia Quick Evaluation

## 2021-06-06 NOTE — Progress Notes (Signed)
PROGRESS NOTE    Kyle Lewis  GQQ:[id number] DOB: 10-06-1945 DOA: 05/27/2021 PCP: [name], MD   Chief complaint.  Shortness of breath. Brief Narrative:   76 year old with history of COPD, obstructive sleep apnea on CPAP at home, hypertension presented to the ER with worsening shortness of breath for about 2 weeks.  Treated with steroids and antibiotics as outpatient.  On arrival to the ER, he was 94% on 4 L oxygen.  Blood pressure stable.  BNP 1348.  COVID and flu negative.  Admitted to hospital with acute on chronic diastolic heart failure secondary to severe aortic stenosis.  Assessment & Plan:   Principal Problem:   Acute CHF (congestive heart failure) (HCC) Active Problems:   OSA (obstructive sleep apnea)   Exogenous obesity   Tobacco use   Severe aortic stenosis   Acute respiratory failure with hypoxia and hypercapnia (HCC)   Acute on chronic diastolic heart failure (HCC)   Mitral regurgitation  Acute on chronic hypoxemic and hypercapnic respiratory failure, multifactorial. Acute on chronic diastolic congestive heart failure severe aortic stenosis.  Obstructive sleep apnea. Patient condition had improved, currently on oral Lasix and Aldactone. Patient is scheduled for TAVR procedure on 1/3 by cardiology. Patient will need home oxygen evaluation before discharge.    DVT prophylaxis: Lovenox Code Status: full Family Communication:  Disposition Plan:      Status is: Inpatient   Remains inpatient appropriate because: pending inpatient procedure.      I/O last 3 completed shifts: In: 53 [P.O.:1560] Out: 54 [Urine:4850] Total I/O In: 240 [P.O.:240] Out: 500 [Urine:500]     Consultants:  Cardiology  Procedures: None  Antimicrobials:None  Subjective: Did well last night, tolerating BiPAP. Still on 2 L oxygen, does not feel short of breath. No cough. No abdominal pain nausea vomiting. No fever or chills  Objective: Vitals:    06/06/21 0400 06/06/21 0430 06/06/21 0607 06/06/21 0820  BP: 125/81     Pulse: 69 71 65 70  Resp: 16 20 17 17   Temp: 98.6 F (37 C)     TempSrc:      SpO2: 94% 96% 95% 94%  Weight: 97 kg     Height:        Intake/Output Summary (Last 24 hours) at 06/06/2021 1032 Last data filed at 06/06/2021 0805 Gross per 24 hour  Intake 1320 ml  Output 3800 ml  Net -2480 ml   Filed Weights   06/04/21 0357 06/05/21 0519 06/06/21 0400  Weight: 97.1 kg 96.4 kg 97 kg    Examination:  General exam: Appears calm and comfortable  Respiratory system: Clear to auscultation. Respiratory effort normal. Cardiovascular system: S1 & S2 heard, RRR.  2/6 systolic murmur in right upper sternal border.. No pedal edema. Gastrointestinal system: Abdomen is nondistended, soft and nontender. No organomegaly or masses felt. Normal bowel sounds heard. Central nervous system: Alert and oriented. No focal neurological deficits. Extremities: Symmetric 5 x 5 power. Skin: No rashes, lesions or ulcers Psychiatry: Judgement and insight appear normal. Mood & affect appropriate.     Data Reviewed: I have personally reviewed following labs and imaging studies  CBC: Recent Labs  Lab 05/31/21 1320 05/31/21 1323  HGB 16.3 17.0  HCT 48.0 67.1   Basic Metabolic Panel: Recent Labs  Lab 05/31/21 0419 05/31/21 1320 05/31/21 1323 06/01/21 0824 06/02/21 0351 06/03/21 0933 06/06/21 0133  NA 139   < > 137 136 133* 138 137  K 4.8   < > 4.5 4.4 4.7  4.4 4.4  CL 87*  --   --  86* 85* 91* 94*  CO2 45*  --   --  43* 40* 39* 36*  GLUCOSE 99  --   --  138* 92 115* 107*  BUN 32*  --   --  33* 40* 36* 23  CREATININE 0.84  --   --  0.85 1.11 0.80 0.89  CALCIUM 8.5*  --   --  8.5* 8.3* 8.6* 8.5*   < > = values in this interval not displayed.   GFR: Estimated Creatinine Clearance: 78.2 mL/min (by C-G formula based on SCr of 0.89 mg/dL). Liver Function Tests: No results for input(s): AST, ALT, ALKPHOS, BILITOT, PROT, ALBUMIN  in the last 168 hours. No results for input(s): LIPASE, AMYLASE in the last 168 hours. No results for input(s): AMMONIA in the last 168 hours. Coagulation Profile: No results for input(s): INR, PROTIME in the last 168 hours. Cardiac Enzymes: No results for input(s): CKTOTAL, CKMB, CKMBINDEX, TROPONINI in the last 168 hours. BNP (last 3 results) No results for input(s): PROBNP in the last 8760 hours. HbA1C: No results for input(s): HGBA1C in the last 72 hours. CBG: Recent Labs  Lab 06/04/21 2122 06/05/21 0622 06/05/21 1124 06/05/21 1556 06/05/21 2143  GLUCAP 148* 101* 107* 121* 123*   Lipid Profile: No results for input(s): CHOL, HDL, LDLCALC, TRIG, CHOLHDL, LDLDIRECT in the last 72 hours. Thyroid Function Tests: No results for input(s): TSH, T4TOTAL, FREET4, T3FREE, THYROIDAB in the last 72 hours. Anemia Panel: No results for input(s): VITAMINB12, FOLATE, FERRITIN, TIBC, IRON, RETICCTPCT in the last 72 hours. Sepsis Labs: No results for input(s): PROCALCITON, LATICACIDVEN in the last 168 hours.  Recent Results (from the past 240 hour(s))  Resp Panel by RT-PCR (Flu A&B, Covid) Nasopharyngeal Swab     Status: None   Collection Time: 05/27/21  2:08 PM   Specimen: Nasopharyngeal Swab; Nasopharyngeal(NP) swabs in vial transport medium  Result Value Ref Range Status   SARS Coronavirus 2 by RT PCR NEGATIVE NEGATIVE Final    Comment: (NOTE) SARS-CoV-2 target nucleic acids are NOT DETECTED.  The SARS-CoV-2 RNA is generally detectable in upper respiratory specimens during the acute phase of infection. The lowest concentration of SARS-CoV-2 viral copies this assay can detect is 138 copies/mL. A negative result does not preclude SARS-Cov-2 infection and should not be used as the sole basis for treatment or other patient management decisions. A negative result may occur with  improper specimen collection/handling, submission of specimen other than nasopharyngeal swab, presence of  viral mutation(s) within the areas targeted by this assay, and inadequate number of viral copies(<138 copies/mL). A negative result must be combined with clinical observations, patient history, and epidemiological information. The expected result is Negative.  Fact Sheet for Patients:  EntrepreneurPulse.com.au  Fact Sheet for Healthcare Providers:  IncredibleEmployment.be  This test is no t yet approved or cleared by the [place] FDA and  has been authorized for detection and/or diagnosis of SARS-CoV-2 by FDA under an Emergency Use Authorization (EUA). This EUA will remain  in effect (meaning this test can be used) for the duration of the COVID-19 declaration under Section 564(b)(1) of the Act, 21 U.S.C.section 360bbb-3(b)(1), unless the authorization is terminated  or revoked sooner.       Influenza A by PCR NEGATIVE NEGATIVE Final   Influenza B by PCR NEGATIVE NEGATIVE Final    Comment: (NOTE) The Xpert Xpress SARS-CoV-2/FLU/RSV plus assay is intended as an aid in the diagnosis of  influenza from Nasopharyngeal swab specimens and should not be used as a sole basis for treatment. Nasal washings and aspirates are unacceptable for Xpert Xpress SARS-CoV-2/FLU/RSV testing.  Fact Sheet for Patients: EntrepreneurPulse.com.au  Fact Sheet for Healthcare Providers: IncredibleEmployment.be  This test is not yet approved or cleared by the [place] FDA and has been authorized for detection and/or diagnosis of SARS-CoV-2 by FDA under an Emergency Use Authorization (EUA). This EUA will remain in effect (meaning this test can be used) for the duration of the COVID-19 declaration under Section 564(b)(1) of the Act, 21 U.S.C. section 360bbb-3(b)(1), unless the authorization is terminated or revoked.  Performed at [facility], [place] [address], [place], [place] [address]           Radiology Studies: No results found.      Scheduled Meds:  aspirin EC  81 mg Oral Daily   enoxaparin (LOVENOX) injection  40 mg Subcutaneous QHS   feeding supplement  237 mL Oral BID BM   finasteride  5 mg Oral Daily   fluticasone furoate-vilanterol  1 puff Inhalation Daily   furosemide  40 mg Oral Daily   gabapentin  300 mg Oral BID   insulin aspart  0-5 Units Subcutaneous QHS   insulin aspart  0-9 Units Subcutaneous TID WC   ipratropium-albuterol  3 mL Nebulization BID   [START ON 06/07/2021] magnesium sulfate  40 mEq Other To OR   mirabegron ER  25 mg Oral Daily   [START ON 06/07/2021] potassium chloride  80 mEq Other To OR   rosuvastatin  20 mg Oral Daily   sodium chloride flush  3 mL Intravenous Q12H   tamsulosin  0.4 mg Oral QPC supper   Continuous Infusions:  sodium chloride     [START ON 06/07/2021] dexmedetomidine     [START ON 06/07/2021] heparin 30,000 units/NS 1000 mL solution for CELLSAVER     [START ON 06/07/2021] norepinephrine (LEVOPHED) Adult infusion     [START ON 06/07/2021] vancomycin       LOS: 10 days    Time spent: 24 minutes    Sharen Hones, MD [facility]   To contact the attending provider between 7A-7P or the covering provider during after hours 7P-7A, please log into the web site [email or web address] and access using universal [place] password for that web site. If you do not have the password, please call the hospital operator.  06/06/2021, 10:32 AM

## 2021-06-06 NOTE — Progress Notes (Signed)
Mobility Specialist Progress Note:   06/06/21 1311  Mobility  Activity Ambulated in hall  Level of Assistance Standby assist, set-up cues, supervision of patient - no hands on  Assistive Device Front wheel walker  Distance Ambulated (ft) 80 ft  Mobility Ambulated with assistance in hallway  Mobility Response Tolerated well  Mobility performed by Mobility specialist  Bed Position Chair  $Mobility charge 1 Mobility   Pt received in chair willing to participate in mobility. No complaints of pain. Pt returned to chair with call bell in reach and all needs met.  [facility] Public librarian Phone [phone] Secondary Phone [phone]

## 2021-06-07 ENCOUNTER — Encounter (HOSPITAL_COMMUNITY)
Admission: EM | Disposition: A | Discharge status: Home / Self Care | Payer: Medicare Other | Source: Home / Self Care | Attending: Family Medicine

## 2021-06-07 ENCOUNTER — Inpatient Hospital Stay (HOSPITAL_COMMUNITY): Payer: Medicare Other | Admitting: Anesthesiology

## 2021-06-07 ENCOUNTER — Inpatient Hospital Stay (HOSPITAL_COMMUNITY): Payer: Medicare Other

## 2021-06-07 ENCOUNTER — Encounter (HOSPITAL_COMMUNITY): Payer: Self-pay | Admitting: Internal Medicine

## 2021-06-07 ENCOUNTER — Other Ambulatory Visit: Payer: Self-pay | Admitting: Physician Assistant

## 2021-06-07 DIAGNOSIS — Z006 Encounter for examination for normal comparison and control in clinical research program: Secondary | ICD-10-CM | POA: Diagnosis not present

## 2021-06-07 DIAGNOSIS — Z952 Presence of prosthetic heart valve: Secondary | ICD-10-CM

## 2021-06-07 DIAGNOSIS — I350 Nonrheumatic aortic (valve) stenosis: Secondary | ICD-10-CM | POA: Diagnosis not present

## 2021-06-07 DIAGNOSIS — I5043 Acute on chronic combined systolic (congestive) and diastolic (congestive) heart failure: Secondary | ICD-10-CM | POA: Diagnosis not present

## 2021-06-07 DIAGNOSIS — I5033 Acute on chronic diastolic (congestive) heart failure: Secondary | ICD-10-CM | POA: Diagnosis not present

## 2021-06-07 DIAGNOSIS — J9602 Acute respiratory failure with hypercapnia: Secondary | ICD-10-CM | POA: Diagnosis not present

## 2021-06-07 DIAGNOSIS — J9601 Acute respiratory failure with hypoxia: Secondary | ICD-10-CM | POA: Diagnosis not present

## 2021-06-07 DIAGNOSIS — Z20822 Contact with and (suspected) exposure to covid-19: Secondary | ICD-10-CM | POA: Diagnosis not present

## 2021-06-07 HISTORY — DX: Presence of prosthetic heart valve: Z95.2

## 2021-06-07 HISTORY — PX: ULTRASOUND GUIDANCE FOR VASCULAR ACCESS: SHX6516

## 2021-06-07 HISTORY — PX: TRANSCATHETER AORTIC VALVE REPLACEMENT, TRANSFEMORAL: SHX6400

## 2021-06-07 HISTORY — PX: TEE WITHOUT CARDIOVERSION: SHX5443

## 2021-06-07 LAB — POCT I-STAT, CHEM 8
BUN: 22 mg/dL (ref 8–23)
BUN: 21 mg/dL (ref 8–23)
Calcium, Ion: 1.2 mmol/L (ref 1.15–1.40)
Calcium, Ion: 1.2 mmol/L (ref 1.15–1.40)
Chloride: 96 mmol/L — ABNORMAL LOW (ref 98–111)
Chloride: 96 mmol/L — ABNORMAL LOW (ref 98–111)
Creatinine, Ser: 0.7 mg/dL (ref 0.61–1.24)
Creatinine, Ser: 0.7 mg/dL (ref 0.61–1.24)
Glucose, Bld: 113 mg/dL — ABNORMAL HIGH (ref 70–99)
Glucose, Bld: 132 mg/dL — ABNORMAL HIGH (ref 70–99)
HCT: 43 % (ref 39.0–52.0)
HCT: 43 % (ref 39.0–52.0)
Hemoglobin: 14.6 g/dL (ref 13.0–17.0)
Hemoglobin: 14.6 g/dL (ref 13.0–17.0)
Potassium: 4.4 mmol/L (ref 3.5–5.1)
Potassium: 4.3 mmol/L (ref 3.5–5.1)
Sodium: 137 mmol/L (ref 135–145)
Sodium: 138 mmol/L (ref 135–145)
TCO2: 34 mmol/L — ABNORMAL HIGH (ref 22–32)
TCO2: 34 mmol/L — ABNORMAL HIGH (ref 22–32)

## 2021-06-07 LAB — BASIC METABOLIC PANEL
Anion gap: 5 (ref 5–15)
BUN: 22 mg/dL (ref 8–23)
CO2: 37 mmol/L — ABNORMAL HIGH (ref 22–32)
Calcium: 8.4 mg/dL — ABNORMAL LOW (ref 8.9–10.3)
Chloride: 95 mmol/L — ABNORMAL LOW (ref 98–111)
Creatinine, Ser: 0.8 mg/dL (ref 0.61–1.24)
GFR, Estimated: >60 mL/min (ref >60)
Glucose, Bld: 104 mg/dL — ABNORMAL HIGH (ref 70–99)
Potassium: 4.1 mmol/L (ref 3.5–5.1)
Sodium: 137 mmol/L (ref 135–145)

## 2021-06-07 LAB — ECHO INTRAOPERATIVE TEE
AR max vel: 2.98 cm2
AV Area VTI: 2.55 cm2
AV Area mean vel: 3.01 cm2
AV Mean grad: 5 mmHg
AV Peak grad: 9.6 mmHg
Ao pk vel: 1.55 m/s
Height: 67 in
Weight: 3365.1 oz

## 2021-06-07 LAB — GLUCOSE, CAPILLARY
Glucose-Capillary: 95 mg/dL (ref 70–99)
Glucose-Capillary: 135 mg/dL — ABNORMAL HIGH (ref 70–99)
Glucose-Capillary: 104 mg/dL — ABNORMAL HIGH (ref 70–99)
Glucose-Capillary: 146 mg/dL — ABNORMAL HIGH (ref 70–99)
Glucose-Capillary: 255 mg/dL — ABNORMAL HIGH (ref 70–99)

## 2021-06-07 LAB — URINALYSIS, ROUTINE W REFLEX MICROSCOPIC
Bilirubin Urine: NEGATIVE
Glucose, UA: NEGATIVE mg/dL
Hgb urine dipstick: NEGATIVE
Ketones, ur: NEGATIVE mg/dL
Leukocytes,Ua: NEGATIVE
Nitrite: NEGATIVE
Protein, ur: NEGATIVE mg/dL
Specific Gravity, Urine: <1.005 — ABNORMAL LOW (ref 1.005–1.030)
pH: 7 (ref 5.0–8.0)

## 2021-06-07 LAB — SURGICAL PCR SCREEN
MRSA, PCR: NEGATIVE
Staphylococcus aureus: NEGATIVE

## 2021-06-07 SURGERY — IMPLANTATION, AORTIC VALVE, TRANSCATHETER, FEMORAL APPROACH
Anesthesia: General | Site: Groin

## 2021-06-07 MED ORDER — CLEVIDIPINE BUTYRATE 0.5 MG/ML IV EMUL
INTRAVENOUS | Status: DC | PRN
  Administered 2021-06-07: 2 mg/h via INTRAVENOUS

## 2021-06-07 MED ORDER — PROPOFOL 10 MG/ML IV BOLUS
INTRAVENOUS | Status: AC
  Filled 2021-06-07: qty 20

## 2021-06-07 MED ORDER — LACTATED RINGERS IV SOLN
INTRAVENOUS | Status: DC | PRN
Start: 2021-06-07 — End: 2021-06-07

## 2021-06-07 MED ORDER — ROCURONIUM BROMIDE 10 MG/ML (PF) SYRINGE
PREFILLED_SYRINGE | INTRAVENOUS | Status: AC
  Filled 2021-06-07: qty 10

## 2021-06-07 MED ORDER — EPHEDRINE SULFATE-NACL 50-0.9 MG/10ML-% IV SOSY
PREFILLED_SYRINGE | INTRAVENOUS | Status: DC | PRN
  Administered 2021-06-07: 10 mg via INTRAVENOUS

## 2021-06-07 MED ORDER — SODIUM CHLORIDE 0.9 % IV SOLN: 250.0000 mL | INTRAVENOUS | Status: DC | PRN

## 2021-06-07 MED ORDER — SODIUM CHLORIDE 0.9% FLUSH: 3.0000 mL | INTRAVENOUS | Status: DC | PRN

## 2021-06-07 MED ORDER — HEPARIN SODIUM (PORCINE) 1000 UNIT/ML IJ SOLN
INTRAMUSCULAR | Status: AC
  Filled 2021-06-07: qty 1

## 2021-06-07 MED ORDER — LIDOCAINE HCL 1 % IJ SOLN
INTRAMUSCULAR | Status: AC
  Filled 2021-06-07: qty 20

## 2021-06-07 MED ORDER — SODIUM CHLORIDE 0.9% FLUSH
3.0000 mL | Freq: Two times a day (BID) | INTRAVENOUS | Status: DC
  Administered 2021-06-08 – 2021-06-10 (×4): 3 mL via INTRAVENOUS

## 2021-06-07 MED ORDER — HEPARIN 6000 UNIT IRRIGATION SOLUTION
Status: DC | PRN
  Administered 2021-06-07 (×3): 1

## 2021-06-07 MED ORDER — HEPARIN SODIUM (PORCINE) 1000 UNIT/ML IJ SOLN
INTRAMUSCULAR | Status: DC | PRN
  Administered 2021-06-07: 14000 [IU] via INTRAVENOUS

## 2021-06-07 MED ORDER — OXYCODONE HCL 5 MG PO TABS: 5.0000 mg | ORAL_TABLET | ORAL | Status: DC | PRN

## 2021-06-07 MED ORDER — PHENYLEPHRINE 40 MCG/ML (10ML) SYRINGE FOR IV PUSH (FOR BLOOD PRESSURE SUPPORT)
PREFILLED_SYRINGE | INTRAVENOUS | Status: AC
  Filled 2021-06-07: qty 10

## 2021-06-07 MED ORDER — LACTATED RINGERS IV SOLN: INTRAVENOUS | Status: DC | PRN

## 2021-06-07 MED ORDER — PROTAMINE SULFATE 10 MG/ML IV SOLN
INTRAVENOUS | Status: DC | PRN
  Administered 2021-06-07: 140 mg via INTRAVENOUS

## 2021-06-07 MED ORDER — SUGAMMADEX SODIUM 200 MG/2ML IV SOLN
INTRAVENOUS | Status: DC | PRN
  Administered 2021-06-07: 100 mg via INTRAVENOUS
  Administered 2021-06-07: 200 mg via INTRAVENOUS

## 2021-06-07 MED ORDER — PHENYLEPHRINE 40 MCG/ML (10ML) SYRINGE FOR IV PUSH (FOR BLOOD PRESSURE SUPPORT)
PREFILLED_SYRINGE | INTRAVENOUS | Status: DC | PRN
  Administered 2021-06-07: 80 ug via INTRAVENOUS
  Administered 2021-06-07 (×2): 40 ug via INTRAVENOUS
  Administered 2021-06-07: 80 ug via INTRAVENOUS

## 2021-06-07 MED ORDER — ROCURONIUM BROMIDE 10 MG/ML (PF) SYRINGE
PREFILLED_SYRINGE | INTRAVENOUS | Status: DC | PRN
  Administered 2021-06-07: 30 mg via INTRAVENOUS
  Administered 2021-06-07: 70 mg via INTRAVENOUS

## 2021-06-07 MED ORDER — ONDANSETRON HCL 4 MG/2ML IJ SOLN
INTRAMUSCULAR | Status: AC
  Filled 2021-06-07: qty 2

## 2021-06-07 MED ORDER — NITROGLYCERIN IN D5W 200-5 MCG/ML-% IV SOLN: 0.0000 ug/min | INTRAVENOUS | Status: DC

## 2021-06-07 MED ORDER — VANCOMYCIN HCL IN DEXTROSE 1-5 GM/200ML-% IV SOLN
1000.0000 mg | Freq: Once | INTRAVENOUS | Status: AC
  Administered 2021-06-07: 1000 mg via INTRAVENOUS
  Filled 2021-06-07 (×2): qty 200

## 2021-06-07 MED ORDER — DEXAMETHASONE SODIUM PHOSPHATE 10 MG/ML IJ SOLN
INTRAMUSCULAR | Status: DC | PRN
  Administered 2021-06-07: 10 mg via INTRAVENOUS

## 2021-06-07 MED ORDER — TRAMADOL HCL 50 MG PO TABS: 50.0000 mg | ORAL_TABLET | ORAL | Status: DC | PRN

## 2021-06-07 MED ORDER — IODIXANOL 320 MG/ML IV SOLN
INTRAVENOUS | Status: DC | PRN
  Administered 2021-06-07: 40 mL via INTRA_ARTERIAL

## 2021-06-07 MED ORDER — MORPHINE SULFATE (PF) 2 MG/ML IV SOLN: 1.0000 mg | INTRAVENOUS | Status: DC | PRN

## 2021-06-07 MED ORDER — LIDOCAINE HCL 1 % IJ SOLN
INTRAMUSCULAR | Status: DC | PRN
  Administered 2021-06-07: 2 mL

## 2021-06-07 MED ORDER — FENTANYL CITRATE (PF) 250 MCG/5ML IJ SOLN
INTRAMUSCULAR | Status: AC
  Filled 2021-06-07: qty 5

## 2021-06-07 MED ORDER — ONDANSETRON HCL 4 MG/2ML IJ SOLN
INTRAMUSCULAR | Status: DC | PRN
  Administered 2021-06-07: 4 mg via INTRAVENOUS

## 2021-06-07 MED ORDER — PROTAMINE SULFATE 10 MG/ML IV SOLN
INTRAVENOUS | Status: AC
  Filled 2021-06-07: qty 25

## 2021-06-07 MED ORDER — SODIUM CHLORIDE 0.9 % IV SOLN: INTRAVENOUS | Status: AC

## 2021-06-07 MED ORDER — PROPOFOL 10 MG/ML IV BOLUS
INTRAVENOUS | Status: DC | PRN
  Administered 2021-06-07: 50 mg via INTRAVENOUS

## 2021-06-07 MED ORDER — FENTANYL CITRATE (PF) 250 MCG/5ML IJ SOLN
INTRAMUSCULAR | Status: DC | PRN
Start: 2021-06-07 — End: 2021-06-07
  Administered 2021-06-07: 150 ug via INTRAVENOUS

## 2021-06-07 MED ORDER — PHENYLEPHRINE HCL-NACL 20-0.9 MG/250ML-% IV SOLN
INTRAVENOUS | Status: DC | PRN
  Administered 2021-06-07: 50 ug/min via INTRAVENOUS

## 2021-06-07 MED ORDER — HEPARIN 6000 UNIT IRRIGATION SOLUTION
Status: AC
  Filled 2021-06-07: qty 1500

## 2021-06-07 SURGICAL SUPPLY — 62 items
ADH SKN CLS APL DERMABOND .7 (GAUZE/BANDAGES/DRESSINGS) ×3
APL PRP STRL LF DISP 70% ISPRP (MISCELLANEOUS) ×6
BAG COUNTER SPONGE SURGICOUNT (BAG) ×4 IMPLANT
BAG DECANTER FOR FLEXI CONT (MISCELLANEOUS) ×2 IMPLANT
BAG SNAP BAND KOVER 36X36 (MISCELLANEOUS) ×4 IMPLANT
BAG SPNG CNTER NS LX DISP (BAG) ×2
BLADE CLIPPER SURG (BLADE) ×2 IMPLANT
CABLE ADAPT CONN TEMP 6FT (ADAPTER) ×4 IMPLANT
CATH DIAG EXPO 6F VENT PIG 145 (CATHETERS) ×10 IMPLANT
CATH INFINITI 6F AL2 (CATHETERS) ×2 IMPLANT
CATH S G BIP PACING (CATHETERS) ×4 IMPLANT
CHLORAPREP W/TINT 26 (MISCELLANEOUS) ×6 IMPLANT
CLOSURE MYNX CONTROL 6F/7F (Vascular Products) ×2 IMPLANT
CNTNR URN SCR LID CUP LEK RST (MISCELLANEOUS) ×6 IMPLANT
CONT SPEC 4OZ STRL OR WHT (MISCELLANEOUS) ×8
COVER BACK TABLE 80X110 HD (DRAPES) ×6 IMPLANT
COVER DOME SNAP 22 D (MISCELLANEOUS) ×2 IMPLANT
DECANTER SPIKE VIAL GLASS SM (MISCELLANEOUS) ×4 IMPLANT
DERMABOND ADVANCED (GAUZE/BANDAGES/DRESSINGS) ×1
DERMABOND ADVANCED .7 DNX12 (GAUZE/BANDAGES/DRESSINGS) ×3 IMPLANT
DEVICE CLOSURE PERCLS PRGLD 6F (VASCULAR PRODUCTS) ×6 IMPLANT
DRAPE INCISE IOBAN 66X45 STRL (DRAPES) ×4 IMPLANT
DRSG TEGADERM 4X4.75 (GAUZE/BANDAGES/DRESSINGS) ×8 IMPLANT
ELECT REM PT RETURN 9FT ADLT (ELECTROSURGICAL) ×4
ELECTRODE REM PT RTRN 9FT ADLT (ELECTROSURGICAL) ×5 IMPLANT
GAUZE SPONGE 4X4 12PLY STRL (GAUZE/BANDAGES/DRESSINGS) ×4 IMPLANT
GLOVE EUDERMIC 7 POWDERFREE (GLOVE) IMPLANT
GLOVE SURG ENC MOIS LTX SZ7.5 (GLOVE) ×4 IMPLANT
GLOVE SURG ENC MOIS LTX SZ8 (GLOVE) IMPLANT
GLOVE SURG ORTHO LTX SZ7.5 (GLOVE) IMPLANT
GOWN STRL REUS W/ TWL LRG LVL3 (GOWN DISPOSABLE) IMPLANT
GOWN STRL REUS W/ TWL XL LVL3 (GOWN DISPOSABLE) ×3 IMPLANT
GOWN STRL REUS W/TWL LRG LVL3 (GOWN DISPOSABLE)
GOWN STRL REUS W/TWL XL LVL3 (GOWN DISPOSABLE) ×4
GUIDEWIRE SAFE TJ AMPLATZ EXST (WIRE) ×4 IMPLANT
KIT BASIN OR (CUSTOM PROCEDURE TRAY) ×4 IMPLANT
KIT HEART LEFT (KITS) ×4 IMPLANT
KIT TURNOVER KIT B (KITS) ×4 IMPLANT
NS IRRIG 1000ML POUR BTL (IV SOLUTION) ×8 IMPLANT
PACK ENDO MINOR (CUSTOM PROCEDURE TRAY) ×4 IMPLANT
PAD ARMBOARD 7.5X6 YLW CONV (MISCELLANEOUS) ×8 IMPLANT
PAD ELECT DEFIB RADIOL ZOLL (MISCELLANEOUS) ×4 IMPLANT
PERCLOSE PROGLIDE 6F (VASCULAR PRODUCTS) ×8
POSITIONER HEAD DONUT 9IN (MISCELLANEOUS) ×4 IMPLANT
SET MICROPUNCTURE 5F STIFF (MISCELLANEOUS) ×4 IMPLANT
SHEATH BRITE TIP 6FR 35CM (SHEATH) ×2 IMPLANT
SHEATH BRITE TIP 7FR 35CM (SHEATH) ×4 IMPLANT
SHEATH PINNACLE 6F 10CM (SHEATH) ×4 IMPLANT
SHEATH PINNACLE 8F 10CM (SHEATH) ×4 IMPLANT
SLEEVE REPOSITIONING LENGTH 30 (MISCELLANEOUS) ×4 IMPLANT
SPONGE T-LAP 4X18 ~~LOC~~+RFID (SPONGE) ×2 IMPLANT
STOPCOCK MORSE 400PSI 3WAY (MISCELLANEOUS) ×8 IMPLANT
SUT SILK  1 MH (SUTURE) ×4
SUT SILK 1 MH (SUTURE) ×3 IMPLANT
SYR 50ML LL SCALE MARK (SYRINGE) ×4 IMPLANT
TOWEL GREEN STERILE (TOWEL DISPOSABLE) ×8 IMPLANT
TOWEL GREEN STERILE FF (TOWEL DISPOSABLE) ×4 IMPLANT
TRANSDUCER W/STOPCOCK (MISCELLANEOUS) ×8 IMPLANT
VALVE HEART TRANSCATH SZ3 29MM (Valve) ×2 IMPLANT
WIRE EMERALD 3MM-J .035X150CM (WIRE) ×4 IMPLANT
WIRE EMERALD 3MM-J .035X260CM (WIRE) ×4 IMPLANT
WIRE HI TORQ VERSACORE-J 145CM (WIRE) ×2 IMPLANT

## 2021-06-07 NOTE — Progress Notes (Signed)
°  [facility]   MULTIDISCIPLINARY HEART VALVE TEAM   Updated STS for conventional AVR given acute hypoxic respiratory failure with acute on chronic diastolic CHF   Risk of Mortality: 3.131% Renal Failure: 1.599% Permanent Stroke: 2.236% Prolonged Ventilation: 11.996% DSW Infection: 0.271% Reoperation: 3.374% Morbidity or Mortality: 15.631% Short Length of Stay: 29.024% Long Length of Stay: 7.181%

## 2021-06-07 NOTE — Transfer of Care (Signed)
Immediate Anesthesia Transfer of Care Note  Patient: Kyle Lewis  Procedure(s) Performed: TRANSCATHETER AORTIC VALVE REPLACEMENT, RIGHT TRANSFEMORAL (Bilateral: Groin) TRANSESOPHAGEAL ECHOCARDIOGRAM (TEE) (Esophagus) ULTRASOUND GUIDANCE FOR VASCULAR ACCESS (Bilateral: Groin)  Patient Location: Cath Lab  Anesthesia Type:General  Level of Consciousness: awake and alert   Airway & Oxygen Therapy: Patient Spontanous Breathing and Patient connected to face mask oxygen  Post-op Assessment: Report given to RN, Post -op Vital signs reviewed and stable and Patient moving all extremities X 4  Post vital signs: Reviewed  Last Vitals:  Vitals Value Taken Time  BP 127/59 06/07/21 1029  Temp 36.6 C 06/07/21 1025  Pulse 79 06/07/21 1030  Resp 22 06/07/21 1030  SpO2 94 % 06/07/21 1030  Vitals shown include unvalidated device data.  Last Pain:  Vitals:   06/07/21 1025  TempSrc: Temporal  PainSc: Asleep         Complications: No notable events documented.

## 2021-06-07 NOTE — CV Procedure (Signed)
[facility]  TAVR OPERATIVE NOTE   Date of Procedure:  06/07/2021  Preoperative Diagnosis: Severe Aortic Stenosis   Postoperative Diagnosis: Same   Procedure:   Transcatheter Aortic Valve Replacement - Transfemoral Approach  Edwards Sapien 3 THV (size 29 mm, model # [id number], serial # [id number])   Co-Surgeons:  Lauree Chandler, MD and Gaye Pollack, MD   Anesthesiologist:  Glennon Mac  Echocardiographer:  Johnsie Cancel  Pre-operative Echo Findings: Severe aortic stenosis Normal left ventricular systolic function  Post-operative Echo Findings: No paravalvular leak Normal left ventricular systolic function  BRIEF CLINICAL NOTE AND INDICATIONS FOR SURGERY  Kyle Lewis is a 76 y.o. male with a hx of medically managed CAD, AAA s/p repair 2016, severe aortic stenosis, carotid artery disease with known RICA occlusion and 91-50% LICA disease from 5697, OSA on CPAP, hx of TIA 2013, COPD, ongoing tobacco use and HTN admitted with CHF. Given severe AS, planning for TAVR today.   During the course of the patient's preoperative work up they have been evaluated comprehensively by a multidisciplinary team of specialists coordinated through the [facility] in the [facility].  They have been demonstrated to suffer from symptomatic severe aortic stenosis as noted above. The patient has been counseled extensively as to the relative risks and benefits of all options for the treatment of severe aortic stenosis including long term medical therapy, conventional surgery for aortic valve replacement, and transcatheter aortic valve replacement.  The patient has been independently evaluated by Dr. Cyndia Bent with CT surgery and they are felt to be at high risk for conventional surgical aortic valve replacement. The surgeon indicated the patient would be a poor candidate for conventional surgery. Based upon review of all of the patient's preoperative  diagnostic tests they are felt to be candidate for transcatheter aortic valve replacement using the transfemoral approach as an alternative to high risk conventional surgery.    Following the decision to proceed with transcatheter aortic valve replacement, a discussion has been held regarding what types of management strategies would be attempted intraoperatively in the event of life-threatening complications, including whether or not the patient would be considered a candidate for the use of cardiopulmonary bypass and/or conversion to open sternotomy for attempted surgical intervention.  The patient has been advised of a variety of complications that might develop peculiar to this approach including but not limited to risks of death, stroke, paravalvular leak, aortic dissection or other major vascular complications, aortic annulus rupture, device embolization, cardiac rupture or perforation, acute myocardial infarction, arrhythmia, heart block or bradycardia requiring permanent pacemaker placement, congestive heart failure, respiratory failure, renal failure, pneumonia, infection, other late complications related to structural valve deterioration or migration, or other complications that might ultimately cause a temporary or permanent loss of functional independence or other long term morbidity.  The patient provides full informed consent for the procedure as described and all questions were answered preoperatively.    DETAILS OF THE OPERATIVE PROCEDURE  PREPARATION:   The patient is brought to the operating room on the above mentioned date and central monitoring was established by the anesthesia team including placement of a radial arterial line. The patient is placed in the supine position on the operating table.  Intravenous antibiotics are administered. General anesthesia is used.    Baseline transesophageal echocardiogram was performed. The patient's chest, abdomen, both groins, and both lower  extremities are prepared and draped in a sterile manner. A time out procedure is performed.  PERIPHERAL ACCESS:   Using the modified Seldinger technique, femoral arterial and venous access were obtained with placement of a 6 Fr sheath in the artery and a 7 Fr sheath in the vein on the left side using u/s guidance.  A pigtail diagnostic catheter was passed through the femoral arterial sheath under fluoroscopic guidance into the aortic root.  A temporary transvenous pacemaker catheter was passed through the femoral venous sheath under fluoroscopic guidance into the right ventricle.  The pacemaker was tested to ensure stable lead placement and pacemaker capture. Aortic root angiography was performed in order to determine the optimal angiographic angle for valve deployment.  TRANSFEMORAL ACCESS:  A micropuncture kit was used to gain access to the right femoral artery using u/s guidance. Position confirmed with angiography. Pre-closure with double ProGlide closure devices. The patient was heparinized systemically and ACT verified > 250 seconds.    A 16 Fr transfemoral E-sheath was introduced into the right femoral artery after progressively dilating over an Amplatz superstiff wire. An AL-2 catheter was used to direct a straight-tip exchange length wire across the native aortic valve into the left ventricle. This was exchanged out for a pigtail catheter and position was confirmed in the LV apex. Simultaneous LV and Ao pressures were recorded.  The pigtail catheter was then exchanged for an Amplatz Extra-stiff wire in the LV apex.   TRANSCATHETER HEART VALVE DEPLOYMENT:  An Edwards Sapien 3 THV (size 29 mm) was prepared and crimped per manufacturer's guidelines, and the proper orientation of the valve is confirmed on the [employer] delivery system. The valve was advanced through the introducer sheath using normal technique until in an appropriate position in the abdominal aorta beyond the sheath tip.  The balloon was then retracted and using the fine-tuning wheel was centered on the valve. The valve was then advanced across the aortic arch using appropriate flexion of the catheter. The valve was carefully positioned across the aortic valve annulus. The Commander catheter was retracted using normal technique. Once final position of the valve has been confirmed by angiographic assessment, the valve is deployed while temporarily holding ventilation and during rapid ventricular pacing to maintain systolic blood pressure < 50 mmHg and pulse pressure < 10 mmHg. The balloon inflation is held for >3 seconds after reaching full deployment volume. Once the balloon has fully deflated the balloon is retracted into the ascending aorta and valve function is assessed using TEE. There is felt to be no paravalvular leak and no central aortic insufficiency.  The patient's hemodynamic recovery following valve deployment is good.  The deployment balloon and guidewire are both removed. Echo demostrated acceptable post-procedural gradients, stable mitral valve function, and no AI.   PROCEDURE COMPLETION:  The sheath was then removed and closure devices were completed. Protamine was administered once femoral arterial repair was complete. The temporary pacemaker, pigtail catheters and femoral sheaths were removed with a Mynx closure device placed in the artery and manual pressure used for venous hemostasis.    The patient tolerated the procedure well and is transported to the surgical intensive care in stable condition. There were no immediate intraoperative complications. All sponge instrument and needle counts are verified correct at completion of the operation.   No blood products were administered during the operation.  The patient received a total of 40 mL of intravenous contrast during the procedure.  Lauree Chandler MD 06/07/2021 10:00 AM

## 2021-06-07 NOTE — Anesthesia Procedure Notes (Signed)
Arterial Line Insertion Performed by: Annye Asa, MD, Carolan Clines, CRNA  Preanesthetic checklist: patient identified, IV checked, site marked, risks and benefits discussed, surgical consent, monitors and equipment checked, pre-op evaluation, timeout performed and anesthesia consent Right, radial was placed Catheter size: 20 G Hand hygiene performed , maximum sterile barriers used  and Seldinger technique used Allen's test indicative of satisfactory collateral circulation Attempts: 1 Procedure performed without using ultrasound guided technique. Following insertion, Biopatch and dressing applied. Post procedure assessment: normal  Patient tolerated the procedure well with no immediate complications. Additional procedure comments: Placed by Laurena Slimmer.

## 2021-06-07 NOTE — Op Note (Signed)
[facility]   MULTIDISCIPLINARY HEART VALVE TEAM   TAVR OPERATIVE NOTE   Date of Procedure:  06/07/2021  Preoperative Diagnosis: Severe Aortic Stenosis   Postoperative Diagnosis: Same   Procedure:   Transcatheter Aortic Valve Replacement - Percutaneous Right Transfemoral Approach  Edwards Sapien 3 THV (size 29 mm, model # [id number], serial # [id number])   Co-Surgeons:  Gaye Pollack, MD and Lauree Chandler, MD   Anesthesiologist:  Jenita Seashore, MD  Echocardiographer:  P. Johnsie Cancel, MD  Pre-operative Echo Findings: Severe aortic stenosis Normal left ventricular systolic function  Post-operative Echo Findings: No paravalvular leak Normal left ventricular systolic function   BRIEF CLINICAL NOTE AND INDICATIONS FOR SURGERY  This 76 year old gentleman has stage D, severe, symptomatic aortic stenosis with New York Heart Association class III symptoms of exertional fatigue and shortness of breath with a severely calcified trileaflet aortic valve with a mean gradient of 56.5 mmHg and a dimensionless index of 0.19.  Left ventricular ejection fraction is 55%.  There is moderate mitral regurgitation.  Cardiac catheterization shows moderate nonobstructive coronary disease with a mean gradient across aortic valve of 50.6 mmHg and a peak gradient of 59 mmHg.  I agree that aortic valve replacement is indicated in this patient for relief of his progressive symptoms of congestive heart failure.  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  I agree that transcatheter aortic valve replacement would be a reasonable alternative for treating him.  His gated cardiac CTA shows anatomy suitable for TAVR using a SAPIEN 3  valve although he has a shallow left main height of 9.2 mm which could potentially result in left main coronary obstruction.  His abdominal and pelvic CTA shows extensive aortoiliac vascular disease with prior aortobifemoral stent grafting for abdominal aortic aneurysm.  We will need to review his scans with the team to decide on the best approach.  He does appear to have adequate left subclavian artery size to allow insertion.  He has an extensive rash over the upper chest bilaterally which he says just started since hospitalization and is located where a subclavicular incision would be placed.  I would not recommend using his left carotid artery since he has an occluded right internal carotid artery.  His carotid Doppler examination is essentially unchanged from his prior study with occlusion of the right internal carotid artery and 40 to 60% stenosis of the left internal carotid artery.  Vertebral flow is antegrade bilaterally.  He also had an orthopantogram which showed no signs of dental abscess.   The patient was counseled at length regarding treatment alternatives for management of severe symptomatic aortic stenosis. The risks and benefits of surgical intervention has been discussed in detail. Long-term prognosis with medical therapy was discussed. Alternative approaches such as conventional surgical aortic valve replacement, transcatheter aortic valve replacement, and palliative medical therapy were compared and contrasted at length. This discussion was placed in the context of the patient's own specific clinical presentation and past medical history. All of his questions have been addressed.    Following the decision to proceed with transcatheter aortic valve replacement, a discussion was held regarding what types of management strategies would be attempted intraoperatively in the event of life-threatening complications, including whether or not the  patient would be considered a candidate for the use of  cardiopulmonary bypass and/or conversion to open sternotomy for attempted surgical intervention.  I do not think he is a candidate for emergent sternotomy to manage any intraoperative complications.  The patient is aware of the fact that transient use of cardiopulmonary bypass may be necessary. The patient has been advised of a variety of complications that might develop including but not limited to risks of death, stroke, paravalvular leak, aortic dissection or other major vascular complications, aortic annulus rupture, device embolization, cardiac rupture or perforation, mitral regurgitation, acute myocardial infarction, arrhythmia, heart block or bradycardia requiring permanent pacemaker placement, congestive heart failure, respiratory failure, renal failure, pneumonia, infection, other late complications related to structural valve deterioration or migration, or other complications that might ultimately cause a temporary or permanent loss of functional independence or other long term morbidity. The patient provides full informed consent for the procedure as described and all questions were answered.     DETAILS OF THE OPERATIVE PROCEDURE  PREPARATION:    The patient was brought to the operating room on the above mentioned date and appropriate monitoring was established by the anesthesia team. The patient was placed in the supine position on the operating table.  Intravenous antibiotics were administered. General endotracheal anesthesia was induced uneventfully.    Baseline transesophageal echocardiogram was performed. The patient's abdomen and both groins were prepped and draped in a sterile manner. A time out procedure was performed.   PERIPHERAL ACCESS:    Using the modified Seldinger technique, femoral arterial and venous access was obtained with placement of 6 Fr sheaths on the left side.  A pigtail diagnostic catheter was passed through the left arterial sheath under fluoroscopic guidance  into the aortic root.  A temporary transvenous pacemaker catheter was passed through the left femoral venous sheath under fluoroscopic guidance into the right ventricle.  The pacemaker was tested to ensure stable lead placement and pacemaker capture. Aortic root angiography was performed in order to determine the optimal angiographic angle for valve deployment.   TRANSFEMORAL ACCESS:   Percutaneous transfemoral access and sheath placement was performed using ultrasound guidance.  The right common femoral artery was cannulated using a micropuncture needle and appropriate location was verified using hand injection angiogram.  A pair of Abbott Perclose percutaneous closure devices were placed and a 6 French sheath replaced into the femoral artery.  The patient was heparinized systemically and ACT verified > 250 seconds.    A 16 Fr transfemoral E-sheath was introduced into the right common femoral artery after progressively dilating over an Amplatz superstiff wire. An AL-2 catheter was used to direct a straight-tip exchange length wire across the native aortic valve into the left ventricle. This was exchanged out for a pigtail catheter and position was confirmed in the LV apex. Simultaneous LV and Ao pressures were recorded.  The pigtail catheter was exchanged for an Amplatz Extra-stiff wire in the LV apex.    BALLOON AORTIC VALVULOPLASTY:   Not performed   TRANSCATHETER HEART VALVE DEPLOYMENT:   An Edwards Sapien 3 transcatheter heart valve (size 29 mm) was prepared and crimped per manufacturer's guidelines, and the proper orientation of the valve is confirmed on the [employer] delivery system. The valve was advanced through the introducer sheath using normal technique until in an appropriate position in the abdominal aorta beyond the sheath tip. The balloon was then retracted and using the fine-tuning wheel was centered on the valve. The valve was then advanced across  the aortic arch using  appropriate flexion of the catheter. The valve was carefully positioned across the aortic valve annulus. The Commander catheter was retracted using normal technique. Once final position of the valve has been confirmed by angiographic assessment, the valve is deployed while temporarily holding ventilation and during rapid ventricular pacing to maintain systolic blood pressure < 50 mmHg and pulse pressure < 10 mmHg. The balloon inflation is held for >3 seconds after reaching full deployment volume. Once the balloon has fully deflated the balloon is retracted into the ascending aorta and valve function is assessed using echocardiography. There is felt to be no paravalvular leak and no central aortic insufficiency.  The patient's hemodynamic recovery following valve deployment is good.  The deployment balloon and guidewire are both removed.    PROCEDURE COMPLETION:   The sheath was removed and femoral artery closure performed.  Protamine was administered once femoral arterial repair was complete. The temporary pacemaker, pigtail catheters and femoral sheaths were removed with manual pressure used for hemostasis.  A Mynx femoral closure device was utilized following removal of the diagnostic sheath in the left femoral artery.  The patient tolerated the procedure well and is transported to the cath lab recovery area in stable condition. There were no immediate intraoperative complications. All sponge instrument and needle counts are verified correct at completion of the operation.   No blood products were administered during the operation.  The patient received a total of 40 mL of intravenous contrast during the procedure.   Gaye Pollack, MD 06/07/2021

## 2021-06-07 NOTE — Interval H&P Note (Signed)
History and Physical Interval Note:  06/07/2021 6:50 AM  Kyle Lewis  has presented today for surgery, with the diagnosis of Severe Aortic Stenosis.  The various methods of treatment have been discussed with the patient and family. After consideration of risks, benefits and other options for treatment, the patient has consented to  Procedure(s): TRANSCATHETER AORTIC VALVE REPLACEMENT, RIGHT TRANSFEMORAL (Right) POSSIBLE TRANSCATHETER AORTIC VALVE REPLACEMENT,LEFT SUBCLAVIAN (Left) TRANSESOPHAGEAL ECHOCARDIOGRAM (TEE) (N/A) as a surgical intervention.  The patient's history has been reviewed, patient examined, no change in status, stable for surgery.  I have reviewed the patient's chart and labs.  Questions were answered to the patient's satisfaction.     Gaye Pollack

## 2021-06-07 NOTE — Plan of Care (Signed)
  Problem: Education: Goal: Ability to demonstrate management of disease process will improve Outcome: Progressing Goal: Ability to verbalize understanding of medication therapies will improve Outcome: Progressing   

## 2021-06-07 NOTE — Anesthesia Procedure Notes (Addendum)
Procedure Name: Intubation Date/Time: 06/07/2021 7:47 AM Performed by: Carolan Clines, CRNA Pre-anesthesia Checklist: Patient identified, Emergency Drugs available, Suction available and Patient being monitored Patient Re-evaluated:Patient Re-evaluated prior to induction Oxygen Delivery Method: Circle System Utilized Preoxygenation: Pre-oxygenation with 100% oxygen Induction Type: IV induction Ventilation: Oral airway inserted - appropriate to patient size and Two handed mask ventilation required Laryngoscope Size: Miller and 3 Grade View: Grade II Tube type: Oral Tube size: 7.5 mm Number of attempts: 1 Airway Equipment and Method: Stylet and Oral airway Placement Confirmation: ETT inserted through vocal cords under direct vision, positive ETCO2 and breath sounds checked- equal and bilateral Secured at: 24 cm Tube secured with: Tape Dental Injury: Teeth and Oropharynx as per pre-operative assessment  Comments: Placed by Laurena Slimmer

## 2021-06-07 NOTE — Consult Note (Addendum)
[place] Nurse wound re-consult: Refer to previous consult note on 12/27.  Pt was previously noted to have patchy areas of partial thickness and moisture associated skin damage. Bilat buttocks are dark red-purple. Right buttock with Deep tissue pressure injury; 3X3cm. This was not present on admission. Left inner buttock with patchy area of partial thickness skin loss, .2X.2X.2cm.  Previously noted MASD has resolved.  Dressing procedure/placement/frequency: Topical treatment orders provided for bedside nurses to perform as follows: Foam dressing to open areas on buttocks, change Q 3 days or PRN soiling. Deep tissue pressure injuries are high risk to evolve into full thickness skin loss. [place] team will re-assess weekly to determine if a change in topical treatment is indicated at that time.  Julien Girt MSN, RN, [place], [place], [place]

## 2021-06-07 NOTE — Progress Notes (Signed)
PROGRESS NOTE    Kyle Lewis  KPV:[id number] DOB: 1946/02/24 DOA: 05/27/2021 PCP: [name], MD    Brief Narrative:  76 year old with history of COPD, obstructive sleep apnea on CPAP at home, hypertension presented to the ER with worsening shortness of breath for about 2 weeks.  Treated with steroids and antibiotics as outpatient.  On arrival to the ER, he was 94% on 4 L oxygen.  Blood pressure stable.  BNP 1348.  COVID and flu negative.  Admitted to hospital with acute on chronic diastolic heart failure secondary to severe aortic stenosis. TAVR performed on 1/2.  Assessment & Plan:   Active Problems:   Hypertension   OSA (obstructive sleep apnea)   History of right MCA stroke   Exogenous obesity   Tobacco use   Hyperlipidemia LDL goal <70   CAD (coronary artery disease)   Severe aortic stenosis   COPD GOLD II   Acute respiratory failure with hypoxia and hypercapnia (HCC)   Acute on chronic diastolic heart failure (HCC)   Mitral regurgitation   S/P TAVR (transcatheter aortic valve replacement)  Acute on chronic hypoxemic and hypercapnic respiratory failure, multifactorial. Acute on chronic diastolic congestive heart failure severe aortic stenosis.  Obstructive sleep apnea. Patient condition had improved, currently on oral Lasix and Aldactone. TAVR performed today. Patient seem to be doing well, most likely will be discharged home tomorrow.       DVT prophylaxis: Lovenox Code Status: full Family Communication:  Disposition Plan:      Status is: Inpatient   Remains inpatient appropriate because: Status post TAVR, will monitor 1 more night.      I/O last 3 completed shifts: In: 2040 [P.O.:2040] Out: 67 [Urine:5750] Total I/O In: 700 [I.V.:700] Out: 25 [Blood:25]     Consultants:  cardiology  Procedures: TAVR  Antimicrobials: None   Subjective: Seen patient after the procedure, doing well.  States that he feels sleepy today, but no  confusion.  On 2 L oxygen without short of breath. No chest pain. No fever or chills.  Objective: Vitals:   06/07/21 1204 06/07/21 1215 06/07/21 1224 06/07/21 1225  BP: (!) 171/82 (!) 159/77 (!) 159/83   Pulse:   78 78  Resp: 14 15 14 18   Temp:      TempSrc:      SpO2:   95% 94%  Weight:      Height:        Intake/Output Summary (Last 24 hours) at 06/07/2021 1247 Last data filed at 06/07/2021 0955 Gross per 24 hour  Intake 2140 ml  Output 2975 ml  Net -835 ml   Filed Weights   06/05/21 0519 06/06/21 0400 06/07/21 0443  Weight: 96.4 kg 97 kg 95.4 kg    Examination:  General exam: Appears calm and comfortable  Respiratory system: Clear to auscultation. Respiratory effort normal. Cardiovascular system: S1 & S2 heard, RRR. No JVD, murmurs, rubs, gallops or clicks. No pedal edema. Gastrointestinal system: Abdomen is nondistended, soft and nontender. No organomegaly or masses felt. Normal bowel sounds heard. Central nervous system: Alert and oriented. No focal neurological deficits. Extremities: Symmetric 5 x 5 power. Skin: No rashes, lesions or ulcers Psychiatry: Judgement and insight appear normal. Mood & affect appropriate.     Data Reviewed: I have personally reviewed following labs and imaging studies  CBC: Recent Labs  Lab 05/31/21 1320 05/31/21 1323 06/06/21 2028 06/07/21 0807  WBC  --   --  6.6  --   HGB 16.3 17.0  14.8 14.6  HCT 48.0 50.0 44.9 43.0  MCV  --   --  94.3  --   PLT  --   --  147*  --    Basic Metabolic Panel: Recent Labs  Lab 06/02/21 0351 06/03/21 0933 06/06/21 0133 06/06/21 2028 06/07/21 0341 06/07/21 0807  NA 133* 138 137 136 137 137  K 4.7 4.4 4.4 4.6 4.1 4.4  CL 85* 91* 94* 95* 95* 96*  CO2 40* 39* 36* 33* 37*  --   GLUCOSE 92 115* 107* 118* 104* 113*  BUN 40* 36* 23 24* 22 22  CREATININE 1.11 0.80 0.89 0.86 0.80 0.70  CALCIUM 8.3* 8.6* 8.5* 8.7* 8.4*  --    GFR: Estimated Creatinine Clearance: 87.8 mL/min (by C-G formula  based on SCr of 0.7 mg/dL). Liver Function Tests: No results for input(s): AST, ALT, ALKPHOS, BILITOT, PROT, ALBUMIN in the last 168 hours. No results for input(s): LIPASE, AMYLASE in the last 168 hours. No results for input(s): AMMONIA in the last 168 hours. Coagulation Profile: Recent Labs  Lab 06/06/21 2028  INR 0.9   Cardiac Enzymes: No results for input(s): CKTOTAL, CKMB, CKMBINDEX, TROPONINI in the last 168 hours. BNP (last 3 results) No results for input(s): PROBNP in the last 8760 hours. HbA1C: Recent Labs    06/06/21 2028  HGBA1C 5.5   CBG: Recent Labs  Lab 06/06/21 0601 06/06/21 1246 06/06/21 1609 06/06/21 2054 06/07/21 0533  GLUCAP 95 125* 106* 135* 104*   Lipid Profile: No results for input(s): CHOL, HDL, LDLCALC, TRIG, CHOLHDL, LDLDIRECT in the last 72 hours. Thyroid Function Tests: No results for input(s): TSH, T4TOTAL, FREET4, T3FREE, THYROIDAB in the last 72 hours. Anemia Panel: No results for input(s): VITAMINB12, FOLATE, FERRITIN, TIBC, IRON, RETICCTPCT in the last 72 hours. Sepsis Labs: No results for input(s): PROCALCITON, LATICACIDVEN in the last 168 hours.  Recent Results (from the past 240 hour(s))  Surgical pcr screen     Status: None   Collection Time: 06/06/21 11:41 PM   Specimen: Nasal Mucosa; Nasal Swab  Result Value Ref Range Status   MRSA, PCR NEGATIVE NEGATIVE Final   Staphylococcus aureus NEGATIVE NEGATIVE Final    Comment: (NOTE) The Xpert SA Assay (FDA approved for NASAL specimens in patients 39 years of age and older), is one component of a comprehensive surveillance program. It is not intended to diagnose infection nor to guide or monitor treatment. Performed at [facility], [place] [address], [place], [place] [address]          Radiology Studies: DG Chest 2 View  Result Date: 06/06/2021 CLINICAL DATA:  Preoperative assessment. EXAM: CHEST - 2 VIEW COMPARISON:  05/27/2021. FINDINGS: The heart size and mediastinal  contours are stable. Atherosclerotic calcification of the aorta is noted. Lung volumes are low and patchy airspace disease is present at the lung bases. There is mild blunting of the costophrenic angles bilaterally. No pneumothorax. No acute osseous abnormality. IMPRESSION: 1. Low lung volumes with patchy atelectasis or infiltrate at the lung bases. 2. Small bilateral pleural effusions. 3. Aortic atherosclerosis. Electronically Signed   By: Brett Fairy M.D.   On: 06/06/2021 20:45   ECHO INTRAOPERATIVE TEE  Result Date: 06/07/2021  *INTRAOPERATIVE TRANSESOPHAGEAL REPORT *  Patient Name:   VICTORIA EUCEDA Date of Exam: 06/07/2021 Medical Rec #:  [id number]       Height:       67.0 in Accession #:    [id number]  Weight:       210.3 lb Date of Birth:  11-19-1945      BSA:          2.07 m Patient Age:    33 years        BP:           135/72 mmHg Patient Gender: M               HR:           66 bpm. Exam Location:  Anesthesiology Transesophogeal exam was perform intraoperatively during surgical procedure. Patient was closely monitored under general anesthesia during the entirety of examination. Indications:     Aortic Stenosis i35.0 Sonographer:     Raquel Sarna Senior RDCS Performing Phys: [place] Diagnosing Phys: Jenkins Rouge MD PROCEDURE: Intraoperative Transesophogeal 65mm Edwards S3U TAVR Implanted. Complications: No known complications during this procedure. POST-OP IMPRESSIONS _ Left Ventricle: has normal systolic function. The cavity size was normal. _ Right Ventricle: The cavity was normal. _ Aortic Valve: There is no regurgitation. No regurgitation post repair. The gradient recorded across the prosthetic valve is within the expected range. _ Mitral Valve: There is no regurgitation. No regurgitation post repair. The gradient recorded across the prosthetic valve is within the expected range. _ Tricuspid Valve: There is no regurgitation. No regurgitation post repair. The gradient recorded across the  prosthetic valve is within the expected range. _ Comments: Pre TAVR : tri leaflet calcified with severe AS mean gradient 42 peakl 65 mmHg AVA 0.4 cm2 no AR  Post TAVR: well positioned 29 mm Sapien 3 Ultra valve No PVL mean gradient 5 peak 10 mmhg AVA 2.6 cm2. PRE-OP FINDINGS  Left Ventricle: The left ventricle has normal systolic function, with an ejection fraction of 60-65%. The cavity size was normal. There is moderately increased left ventricular wall thickness. There is moderate left ventricular hypertrophy. Right Ventricle: The right ventricle has normal systolic function. The cavity was normal. There is no increase in right ventricular wall thickness. Left Atrium: Left atrial size was dilated. No left atrial/left atrial appendage thrombus was detected. Right Atrium: Right atrial size was normal in size. Interatrial Septum: No atrial level shunt detected by color flow Doppler. Pericardium: Trivial pericardial effusion is present. The pericardial effusion is anterior to the right ventricle. Trivial pericardial effusion anterior to RV present prior to TAVR and stable with no changes post deployment. Mitral Valve: Thickened. Mitral valve regurgitation is mild by color flow Doppler. Tricuspid Valve: The tricuspid valve was normal in structure. Tricuspid valve regurgitation was not visualized by color flow Doppler. Aortic Valve: The aortic valve has been repaired/replaced Aortic valve regurgitation was not assessed by color flow Doppler. Pre TAVR : tri leaflet calcified with severe AS mean gradient 42 peakl 65 mmHg AVA 0.4 cm2 no AR Post TAVR: well positioned 29 mm Sapien 3 Ultra valve No PVL mean gradient 5 peak 10 mmhg AVA 2.6 cm2. Pulmonic Valve: The pulmonic valve was normal in structure, with normal. The gradient recorded across the pulmonic valve is within the expected range. Pulmonic valve regurgitation was not assessed by color flow Doppler. Venous: The inferior vena cava is normal in size with greater than  50% respiratory variability, suggesting right atrial pressure of 3 mmHg. +--------------+--------++  LEFT VENTRICLE            +--------------+--------++  PLAX 2D                   +--------------+--------++  LVOT diam:     2.30 cm    +--------------+--------++  LVOT Area:     4.15 cm   +--------------+--------++                            +--------------+--------++ +------------------+------------++  AORTIC VALVE                      +------------------+------------++  AV Area (Vmax):    2.98 cm       +------------------+------------++  AV Area (Vmean):   3.01 cm       +------------------+------------++  AV Area (VTI):     2.55 cm       +------------------+------------++  AV Vmax:           155.00 cm/s    +------------------+------------++  AV Vmean:          102.000 cm/s   +------------------+------------++  AV VTI:            0.298 m        +------------------+------------++  AV Peak Grad:      9.6 mmHg       +------------------+------------++  AV Mean Grad:      5.0 mmHg       +------------------+------------++  LVOT Vmax:         111.00 cm/s    +------------------+------------++  LVOT Vmean:        73.800 cm/s    +------------------+------------++  LVOT VTI:          0.183 m        +------------------+------------++  LVOT/AV VTI ratio: 0.61           +------------------+------------++ +-------------+--------++  MITRAL VALVE             +--------------+-------+ +-------------+--------++  SHUNTS                   MV Peak grad: 3.6 mmHg   +--------------+-------+ +-------------+--------++  Systemic VTI:  0.18 m    MV Vmax:      0.95 m/s   +--------------+-------+ +-------------+--------++  Systemic Diam: 2.30 cm                            +--------------+-------+  Jenkins Rouge MD Electronically signed by Jenkins Rouge MD Signature Date/Time: 06/07/2021/9:59:43 AM    Final    Structural Heart Procedure  Result Date: 06/07/2021 See surgical note for result.       Scheduled Meds:  aspirin EC  81 mg Oral  Daily   feeding supplement  237 mL Oral BID BM   finasteride  5 mg Oral Daily   fluticasone furoate-vilanterol  1 puff Inhalation Daily   furosemide  40 mg Oral Daily   gabapentin  300 mg Oral BID   insulin aspart  0-5 Units Subcutaneous QHS   insulin aspart  0-9 Units Subcutaneous TID WC   ipratropium-albuterol  3 mL Nebulization BID   mirabegron ER  25 mg Oral Daily   sodium chloride flush  3 mL Intravenous Q12H   sodium chloride flush  3 mL Intravenous Q12H   tamsulosin  0.4 mg Oral QPC supper   Continuous Infusions:  sodium chloride     sodium chloride 50 mL/hr at 06/07/21 1040   sodium chloride     nitroGLYCERIN     vancomycin       LOS: 11 days    Time spent: 24  minutes    Sharen Hones, MD [facility]   To contact the attending provider between 7A-7P or the covering provider during after hours 7P-7A, please log into the web site [email or web address] and access using universal [place] password for that web site. If you do not have the password, please call the hospital operator.  06/07/2021, 12:47 PM

## 2021-06-07 NOTE — Progress Notes (Signed)
Patient arrived from Cathlab to 4e10, patient placed on monitor and CCMD made aware and vital signs obtained.; B groins level 0 at this time. patient bed saturated, sheets changed, CHG bath complete. Patient sacral foam changed. Patient with darkened purple areas to left and right buttock area. Small area of broken skin to left buttock and foam dressing applied. Abbrasion to right buttock area. Bruising to Abdominal area. Patient also with infusing IV on alaris pump. Patient reminded bed rest until 2:10pm. Call bell with in reach.Adysen Raphael, Bettina Gavia RN

## 2021-06-07 NOTE — Discharge Instructions (Signed)

## 2021-06-07 NOTE — Progress Notes (Signed)
Mobility Specialist: Progress Note   06/07/21 1605  Mobility  Activity Ambulated in hall  Level of Assistance Standby assist, set-up cues, supervision of patient - no hands on  Assistive Device Front wheel walker  Distance Ambulated (ft) 150 ft  Mobility Ambulated with assistance in hallway  Mobility Response Tolerated well  Mobility performed by Mobility specialist  Bed Position Chair  $Mobility charge 1 Mobility   Pre-Mobility: 79 HR, 167/82 BP, 95% SpO2 Post-Mobility: 81 HR, 170/99 BP, 96% SpO2  Pt independent with bed mobility and and mod I to stand. Pt ambulated on 3 L/min [place], no c/o throughout. Pt to recliner after walk with call bell and phone in reach. RN present in the room.   [facility] Eriyah Fernando Mobility Specialist Mobility Specialist 4 [place]: [phone] Mobility Specialist 2 [place] and [place]: [phone]

## 2021-06-07 NOTE — Anesthesia Postprocedure Evaluation (Signed)
Anesthesia Post Note  Patient: Kyle Lewis  Procedure(s) Performed: TRANSCATHETER AORTIC VALVE REPLACEMENT, RIGHT TRANSFEMORAL (Bilateral: Groin) TRANSESOPHAGEAL ECHOCARDIOGRAM (TEE) (Esophagus) ULTRASOUND GUIDANCE FOR VASCULAR ACCESS (Bilateral: Groin)     Patient location during evaluation: Cath Lab Anesthesia Type: General Level of consciousness: awake and alert, patient cooperative and oriented Pain management: pain level controlled Vital Signs Assessment: post-procedure vital signs reviewed and stable Respiratory status: nonlabored ventilation, spontaneous breathing, respiratory function stable and patient connected to nasal cannula oxygen Cardiovascular status: stable and blood pressure returned to baseline Postop Assessment: no apparent nausea or vomiting Anesthetic complications: no   No notable events documented.  Last Vitals:  Vitals:   06/07/21 1120 06/07/21 1135  BP: 106/67 (!) 146/71  Pulse: 75 75  Resp: 14 14  Temp:    SpO2: 92% 93%    Last Pain:  Vitals:   06/07/21 1057  TempSrc: Temporal  PainSc: 0-No pain                 Duey Liller,E. Akshat Minehart

## 2021-06-07 NOTE — Progress Notes (Signed)
°  [place] VALVE TEAM  Patient doing well s/p TAVR. He is hemodynamically stable. Groin sites stable. ECG with sinus and old IVCD and no high grade block. Arterial line discontinued and transferred to 4E. Plan for early ambulation after bedrest completed and hopeful discharge over the next 24-48 hours.   Angelena Form PA-C  [facility]  Pager [phone]

## 2021-06-08 ENCOUNTER — Inpatient Hospital Stay (HOSPITAL_COMMUNITY): Payer: Medicare Other

## 2021-06-08 ENCOUNTER — Encounter (HOSPITAL_COMMUNITY): Payer: Self-pay | Admitting: Cardiovascular Disease

## 2021-06-08 DIAGNOSIS — I350 Nonrheumatic aortic (valve) stenosis: Secondary | ICD-10-CM | POA: Diagnosis not present

## 2021-06-08 DIAGNOSIS — J9601 Acute respiratory failure with hypoxia: Secondary | ICD-10-CM | POA: Diagnosis not present

## 2021-06-08 DIAGNOSIS — I5033 Acute on chronic diastolic (congestive) heart failure: Secondary | ICD-10-CM | POA: Diagnosis not present

## 2021-06-08 DIAGNOSIS — J9602 Acute respiratory failure with hypercapnia: Secondary | ICD-10-CM | POA: Diagnosis not present

## 2021-06-08 DIAGNOSIS — Z952 Presence of prosthetic heart valve: Secondary | ICD-10-CM | POA: Diagnosis not present

## 2021-06-08 LAB — BASIC METABOLIC PANEL
Anion gap: 8 (ref 5–15)
BUN: 22 mg/dL (ref 8–23)
CO2: 32 mmol/L (ref 22–32)
Calcium: 8.4 mg/dL — ABNORMAL LOW (ref 8.9–10.3)
Chloride: 97 mmol/L — ABNORMAL LOW (ref 98–111)
Creatinine, Ser: 0.92 mg/dL (ref 0.61–1.24)
GFR, Estimated: >60 mL/min (ref >60)
Glucose, Bld: 116 mg/dL — ABNORMAL HIGH (ref 70–99)
Potassium: 4.3 mmol/L (ref 3.5–5.1)
Sodium: 137 mmol/L (ref 135–145)

## 2021-06-08 LAB — ECHOCARDIOGRAM COMPLETE
AR max vel: 1.44 cm2
AV Area VTI: 1.46 cm2
AV Area mean vel: 1.44 cm2
AV Mean grad: 16.5 mmHg
AV Peak grad: 28.5 mmHg
Ao pk vel: 2.67 m/s
Calc EF: 61 %
Height: 67 in
S' Lateral: 3.5 cm
Single Plane A2C EF: 60.9 %
Single Plane A4C EF: 62.1 %
Weight: 3435.65 oz

## 2021-06-08 LAB — GLUCOSE, CAPILLARY
Glucose-Capillary: 120 mg/dL — ABNORMAL HIGH (ref 70–99)
Glucose-Capillary: 135 mg/dL — ABNORMAL HIGH (ref 70–99)
Glucose-Capillary: 124 mg/dL — ABNORMAL HIGH (ref 70–99)
Glucose-Capillary: 125 mg/dL — ABNORMAL HIGH (ref 70–99)

## 2021-06-08 LAB — CBC
HCT: 41 % (ref 39.0–52.0)
Hemoglobin: 13.6 g/dL (ref 13.0–17.0)
MCH: 31.7 pg (ref 26.0–34.0)
MCHC: 33.2 g/dL (ref 30.0–36.0)
MCV: 95.6 fL (ref 80.0–100.0)
Platelets: 150 10*3/uL (ref 150–400)
RBC: 4.29 MIL/uL (ref 4.22–5.81)
RDW: 13.4 % (ref 11.5–15.5)
WBC: 7.1 10*3/uL (ref 4.0–10.5)
nRBC: 0 % (ref 0.0–0.2)

## 2021-06-08 LAB — MAGNESIUM: Magnesium: 2.1 mg/dL (ref 1.7–2.4)

## 2021-06-08 MED ORDER — SACCHAROMYCES BOULARDII 250 MG PO CAPS: 250.0000 mg | ORAL_CAPSULE | Freq: Two times a day (BID) | ORAL | 0 refills | Status: AC

## 2021-06-08 NOTE — Discharge Summary (Incomplete)
Discharge Summary  Kyle Lewis UXN:[id number] DOB: 12/02/1945  PCP: [name], MD  Admit date: 05/27/2021 Discharge date: 06/08/2021  Time spent: 44mins, more than 50% time spent on coordination of care.   Recommendations for Outpatient Follow-up:  F/u with PCP within a week  for hospital discharge follow up, repeat cbc/bmp at follow up F/u pulmonology F/u with cards Home 02/home health  Unresulted Labs (From admission, onward)     Start     Ordered   06/08/21 0500  CBC  Daily,   R     Question:  Specimen collection method  Answer:  Lab=Lab collect   06/07/21 1230   06/08/21 2542  Basic metabolic panel  Daily,   R     Question:  Specimen collection method  Answer:  Lab=Lab collect   06/07/21 1230   06/06/21 7062  Basic metabolic panel  Daily,   R     Question:  Specimen collection method  Answer:  Lab=Lab collect   06/06/21 2046             Discharge Diagnoses:  Active Hospital Problems   Diagnosis Date Noted   Acute respiratory failure with hypoxia and hypercapnia (HCC) 05/27/2021   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   Mitral regurgitation 06/02/2021   Acute on chronic diastolic heart failure (HCC)    COPD GOLD II 04/07/2019   Severe aortic stenosis 02/01/2018   CAD (coronary artery disease) 02/01/2018   Hyperlipidemia LDL goal <70 01/19/2018   History of right MCA stroke 07/21/2014   Tobacco use 11/08/2013   Exogenous obesity 11/08/2013   OSA (obstructive sleep apnea)    Hypertension     Resolved Hospital Problems   Diagnosis Date Noted Date Resolved   Acute CHF (congestive heart failure) ([place]) 05/27/2021 06/07/2021   S/P TAVR (transcatheter aortic valve replacement) 06/07/2020 06/07/2021    Discharge Condition: stable  Diet recommendation: heart healthy/carb modified  Filed Weights   06/06/21 0400 06/07/21 0443 06/08/21 0417  Weight: 97 kg 95.4 kg 97.4 kg    History of present illness:  *  Hospital Course:   Principal Problem:   Acute respiratory failure with hypoxia and hypercapnia (HCC) Active Problems:   Hypertension   OSA (obstructive sleep apnea)   History of right MCA stroke   Exogenous obesity   Tobacco use   Hyperlipidemia LDL goal <70   CAD (coronary artery disease)   Severe aortic stenosis   COPD GOLD II   Acute on chronic diastolic heart failure (HCC)   Mitral regurgitation   S/P TAVR (transcatheter aortic valve replacement)   Procedures: *  Consultations: *  Discharge Exam: BP (!) 148/77 (BP Location: Right Arm)    Pulse 91    Temp 97.9 F (36.6 C) (Axillary)    Resp 20    Ht 5\' 7"  (1.702 m)    Wt 97.4 kg    SpO2 92%    BMI 33.63 kg/m   General: * Cardiovascular: * Respiratory: *  Discharge Instructions You were cared for by a hospitalist during your hospital stay. If you have any questions about your discharge medications or the care you received while you were in the hospital after you are discharged, you can call the unit and asked to speak with the hospitalist on call if the hospitalist that took care of you is not available. Once you are discharged, your primary care physician will handle any further medical issues. Please note that NO REFILLS for any discharge  medications will be authorized once you are discharged, as it is imperative that you return to your primary care physician (or establish a relationship with a primary care physician if you do not have one) for your aftercare needs so that they can reassess your need for medications and monitor your lab values.  Discharge Instructions     Diet - low sodium heart healthy   Complete by: As directed    Discharge instructions   Complete by: As directed    Please weight yourself daily, please call your doctor if your weight go up 3lbs in three days or increased swelling or sob   Discharge wound care:   Complete by: As directed    Foal dressing to open areas on buttocks, change q3days or as needed if  soiling Pressure off loading F/u with pcp to would healing   Increase activity slowly   Complete by: As directed       Allergies as of 06/08/2021       Reactions   Cefazolin Rash, Other (See Comments)   The patient had surgery and was given cefazolin intraop. ~ 10 days later he developed a rash confirmed by biopsy to be consistent w/ drug eruption. We cannot know for sure, but this is the most likely agent.      Med Rec must be completed prior to using this [facility]***        Durable Medical Equipment  (From admission, onward)           Start     Ordered   06/02/21 1348  For home use only DME oxygen  Once       Comments: Patient Saturations on Room Air at Rest = 77%   Patient Saturations on Room Air while Ambulating = %   Patient Saturations on 4 Liters of oxygen while Ambulating = 92%   Please briefly explain why patient needs home oxygen:Desats at rest 70s 87-92 on 4l n/c  Question Answer Comment  Length of Need Lifetime   Mode or (Route) Nasal cannula   Liters per Minute 4   Frequency Continuous (stationary and portable oxygen unit needed)   Oxygen conserving device Yes   Oxygen delivery system Gas      06/02/21 1347              Discharge Care Instructions  (From admission, onward)           Start     Ordered   06/08/21 0000  Discharge wound care:       Comments: Foal dressing to open areas on buttocks, change q3days or as needed if soiling Pressure off loading F/u with pcp to would healing   06/08/21 1010           Allergies  Allergen Reactions   Cefazolin Rash and Other (See Comments)    The patient had surgery and was given cefazolin intraop. ~ 10 days later he developed a rash confirmed by biopsy to be consistent w/ drug eruption. We cannot know for sure, but this is the most likely agent.      Follow-up Information     Tanda Rockers, MD Follow up on 06/23/2021.   Specialty: Pulmonary Disease Why: [facility]  Follow Up / Pulmonary   Appt at 10:30 AM with Dr. Melvyn Novas.  Please arrive at 10:15 for check in. Contact information: [address] [address] [phone]         Angelena Form  R, PA-C Follow up on 06/15/2021.   Specialties: Cardiology, Radiology Why: @ 1:30pm, please arrive at least 10 minutes early. Contact information: [address] [place] [address] [phone]         [name], MD Follow up in 1 week(s).   Specialties: Family Medicine, Emergency Medicine Why: hospital discharge follow up , please bring in your discharge paper with you to review with your pcp. pcp to repeat basic lab work including cbc/bmp. please follow up with your pcp to monitor blood sugar. Contact information: [place] [address] [phone]         Jenean Lindau, MD .   Specialty: Cardiology Contact information: [place]  [place] [address] [phone]                  The results of significant diagnostics from this hospitalization (including imaging, microbiology, ancillary and laboratory) are listed below for reference.    Significant Diagnostic Studies: DG Orthopantogram  Result Date: 06/01/2021 CLINICAL DATA:  Poor dentition. EXAM: ORTHOPANTOGRAM/PANORAMIC COMPARISON:  None. FINDINGS: Patient motion blurs the central incisors. Dental fillings are present in the upper and lower molars bilaterally. No significant dental caries are present. No significant periapical lucencies are present. The mandible is intact and located. IMPRESSION: 1. Dental fillings in the upper and lower molars bilaterally. 2. No significant dental caries. Electronically Signed   By: San Morelle M.D.   On: 06/01/2021 11:46   DG Chest 2 View  Result Date: 06/06/2021 CLINICAL DATA:  Preoperative assessment. EXAM: CHEST - 2 VIEW COMPARISON:  05/27/2021. FINDINGS: The heart size and mediastinal contours  are stable. Atherosclerotic calcification of the aorta is noted. Lung volumes are low and patchy airspace disease is present at the lung bases. There is mild blunting of the costophrenic angles bilaterally. No pneumothorax. No acute osseous abnormality. IMPRESSION: 1. Low lung volumes with patchy atelectasis or infiltrate at the lung bases. 2. Small bilateral pleural effusions. 3. Aortic atherosclerosis. Electronically Signed   By: Brett Fairy M.D.   On: 06/06/2021 20:45   DG Chest 2 View  Result Date: 05/27/2021 CLINICAL DATA:  Shortness of breath, cough EXAM: CHEST - 2 VIEW COMPARISON:  04/07/2019 FINDINGS: Eventration of the right hemidiaphragm. Small right and trace left pleural effusions. Associated bibasilar atelectasis. No frank interstitial edema.  Mild cardiomegaly.  No pneumothorax. IMPRESSION: Mild cardiomegaly with small right and trace left pleural effusions. Associated bibasilar atelectasis. No frank interstitial edema. Electronically Signed   By: Julian Hy M.D.   On: 05/27/2021 14:30   CARDIAC CATHETERIZATION  Result Date: 05/31/2021   Prox LAD lesion is 25% stenosed.   Dist Cx lesion is 40% stenosed.   Ost 2nd Mrg to 2nd Mrg lesion is 70% stenosed.   Dist RCA lesion is 60% stenosed.   Ost RPDA to RPDA lesion is 40% stenosed. The LAD is a large caliber vessel that courses to the apex. There is mild mid LAD stenosis The Circumflex is a large caliber vessel that gives off a small obtuse marginal branch then 2 moderate caliber obtuse marginal branches. The second obtuse marginal branch has a moderate stenosis, unchanged from last cath in 2019. The RCA is a large dominant artery. The distal RCA has a moderate stenosis, unchanged from last cath in 2019. Severe aortic stenosis (mean gradient 50.6 mmHg, 59 mmHg, AVA 0.93 cm2). Recommendations: Will continue workup for TAVR. His right and left heart pressures  are only mildly abnormal following inpatient diuresis. Continue diuresis. The  structural heart team will provide a formal consultation regarding his candidacy for TAVR.   CT CORONARY MORPH W/CTA COR W/SCORE W/CA W/CM &/OR WO/CM  Addendum Date: 06/01/2021   ADDENDUM REPORT: 06/01/2021 22:12 CLINICAL DATA:  Severe Aortic Stenosis. EXAM: Cardiac TAVR CT TECHNIQUE: A non-contrast, gated CT scan was obtained with axial slices of 3 mm through the heart for aortic valve calcium scoring. A 110 kV retrospective, gated, contrast cardiac scan was obtained. Gantry rotation speed was 250 msecs and collimation was 0.6 mm. Nitroglycerin was not given. The 3D data set was reconstructed in 5% intervals of the 0-95% of the R-R cycle. Systolic and diastolic phases were analyzed on a dedicated workstation using MPR, MIP, and VRT modes. The patient received 100 cc of contrast. FINDINGS: Image quality: Poor. The contrast remained in the SVC and the study had poor contrast opacification. Noise artifact is: Moderate. Valve Morphology: Tricuspid aortic valve that is severely calcified. There is bulky calcification of the [place]. Aortic Valve Calcium score: 2634 Aortic annular dimension: Phase assessed: 25% Annular area: 574 mm2 Annular perimeter: 86.2 mm Max diameter: 30.1 mm Min diameter: 24.6 mm Membranous septum length: 13.4 mm Annular and subannular calcification: None. Optimal coplanar projection: RAO 3 CAU 15 Coronary Artery Height above Annulus: Left Main: 9.2 mm Right Coronary: 16.0 mm Sinus of Valsalva Measurements: Non-coronary: 33.5 mm Right-coronary: 33.6 mm Left-coronary: 34.5 mm Sinus of Valsalva Height: Non-coronary: 25.3 mm Right-coronary: 23.9 mm Left-coronary: 19.5 mm Sinotubular Junction: 28 mm Ascending Thoracic Aorta: 35 mm Coronary Arteries: Normal coronary origin. Right dominance. The study was performed without use of NTG and is insufficient for plaque evaluation. Please refer to recent cardiac catheterization for coronary assessment. 3-vessel coronary calcifications noted. Cardiac  Morphology: Right Atrium: Right atrial size is within normal limits. Contrast reflux into the RA consistent with elevated RA pressure. Right Ventricle: The right ventricular cavity is within normal limits. Left Atrium: Left atrial size is normal in size with no left atrial appendage filling defect. Left Ventricle: The ventricular cavity size is within normal limits. There are no stigmata of prior infarction. There is no abnormal filling defect. Normal left ventricular function, LVEF=56%. No regional wall motion abnormalities. Pulmonary arteries: Dilated suggestive of pulmonary hypertension. Pulmonary veins: Normal pulmonary venous drainage. Pericardium: Normal thickness with no significant effusion or calcium present. Mitral Valve: The mitral valve is normal structure without significant calcification. Extra-cardiac findings: See attached radiology report for non-cardiac structures. IMPRESSION: 1. 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 2. Annular measurements support a 29 mm S3 TAVR (574 mm2). 3. No significant annular or subannular calcifications. 4. Shallow left main height concerning for increased risk for coronary obstruction (9.2 mm). 5. Optimal Fluoroscopic Angle for Delivery: RAO 3 CAU 15 6. Dilated pulmonary artery suggestive of pulmonary hypertension. [place] T. Audie Box, MD Electronically Signed   By: Eleonore Chiquito M.D.   On: 06/01/2021 22:12   Result Date: 06/01/2021 EXAM: OVER-READ INTERPRETATION  CT CHEST The following report is an over-read performed by radiologist Dr. Salvatore Marvel of [facility], [place] on 06/01/2021. This over-read does not include interpretation of cardiac or coronary anatomy or pathology. The coronary CTA interpretation by the cardiologist is attached. COMPARISON:  06/26/2017 chest CT angiogram. FINDINGS: Please see the separate concurrent chest CT angiogram report for details. IMPRESSION: Please see the separate concurrent chest CT angiogram report for details.  Electronically Signed: By: Ilona Sorrel M.D. On: 06/01/2021 13:19  CT ANGIO CHEST AORTA W/CM & OR WO/CM  Result Date: 06/01/2021 CLINICAL DATA:  Inpatient. 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 Aortic stenosis. EXAM: CT ANGIOGRAPHY CHEST, ABDOMEN AND PELVIS TECHNIQUE: Multidetector CT imaging through the chest, abdomen and pelvis was performed using the standard protocol during bolus administration of intravenous contrast. Multiplanar reconstructed images and MIPs were obtained and reviewed to evaluate the vascular anatomy. CONTRAST:  11mL OMNIPAQUE IOHEXOL 350 MG/ML SOLN COMPARISON:  06/26/2017 CT angiogram of the chest, abdomen and pelvis. 05/12/2018 unenhanced CT abdomen/pelvis. 10/25/2018 MRI abdomen. FINDINGS: CTA CHEST FINDINGS Cardiovascular: Borderline mild cardiomegaly. No significant pericardial effusion/thickening. Diffuse thickening and coarse calcification of the aortic valve. Three-vessel coronary atherosclerosis. Atherosclerotic nonaneurysmal thoracic aorta. Top-normal caliber main pulmonary artery (3.1 cm diameter). No central pulmonary emboli. Mediastinum/Nodes: No discrete thyroid nodules. Unremarkable esophagus. No pathologically enlarged axillary, mediastinal or hilar lymph nodes. Lungs/Pleura: No pneumothorax. Small dependent bilateral pleural effusions. Severe centrilobular emphysema with diffuse bronchial wall thickening. Saber sheath trachea. Right upper lobe 0.3 cm solid pulmonary nodule (series 8/image 64), stable since 06/26/2017 chest CT. Mild-to-moderate dependent bilateral lower lobe atelectasis. No lung masses or additional significant pulmonary nodules in the aerated portions of the lungs. Musculoskeletal: No aggressive appearing focal osseous lesions. Mild thoracic spondylosis. Marked degenerative disc disease throughout the cervical spine. CTA ABDOMEN AND PELVIS FINDINGS Hepatobiliary: Normal liver size. Several scattered subcentimeter simple liver cysts throughout  the liver, not appreciably changed from 10/25/2018 MRI abdomen. No appreciable new liver lesions. Cholelithiasis. No biliary ductal dilatation. Pancreas: Normal, with no mass or duct dilation. Spleen: Normal size. No mass. Adrenals/Urinary Tract: Normal right adrenal. Left adrenal 2.3 cm nodule with density 44 HU, stable since at least 05/12/2018 unenhanced CT abdomen study, compatible with an adenoma. Hypodense exophytic 1.6 cm anterior interpolar left renal cortical lesion (series 7/image 150), indeterminate, mildly increased from 1.2 cm on 10/25/2018 MRI, not well evaluated on prior MRI due to artifact. Simple 2.2 cm anterior lower left renal cortical cyst. Additional scattered subcentimeter hypodense bilateral renal cortical lesions are too small to characterize. No hydronephrosis. Chronic mild diffuse bladder wall thickening and trabeculation, similar. Small superior bladder wall diverticula. Stomach/Bowel: Normal non-distended stomach. Postsurgical changes from prior small bowel resection with enteroenterostomy in the mid abdomen. No unexpected small bowel dilatation. No small bowel wall thickening. Marked diffuse colonic diverticulosis, most prominent in the sigmoid colon, with no acute large bowel wall thickening or pericolonic fat stranding. 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 Ectatic 2.8 cm diameter right common iliac artery, increased from 2.4 cm on 06/26/2017 CT. No pathologically enlarged lymph nodes in the abdomen or pelvis. Reproductive: Top-normal size prostate with postsurgical changes from TURP in the right prostate. Nonspecific coarse internal prostatic calcifications. Other: No pneumoperitoneum, ascites or focal fluid collection. Musculoskeletal: No aggressive appearing focal osseous lesions. Bilateral posterior spinal fusion hardware L2-L5. Marked degenerative  disc disease throughout the lumbar spine. VASCULAR MEASUREMENTS PERTINENT TO TAVR: AORTA: Minimal Aortic Diameter-18.1 x 17.1 mm Severity of Aortic Calcification-severe RIGHT PELVIS: Right Common Iliac Artery - Minimal Diameter-11.8 x 10.9 mm Tortuosity-mild Calcification-moderate (predominantly stented) Right External Iliac Artery - Minimal Diameter-7.4 x 6.2 mm Tortuosity-severe Calcification-severe Right Common Femoral Artery - Minimal Diameter-9.1 x 8.5 mm Tortuosity-mild Calcification-severe LEFT PELVIS: Left Common Iliac Artery - Minimal Diameter-9.7 x 9.1 mm Tortuosity-mild-to-moderate Calcification-severe (entirely stented) Left External Iliac Artery - Minimal Diameter-7.2 x 6.6 mm Tortuosity-mild Calcification-severe (stented proximally) Left Common Femoral  Artery - Minimal Diameter-9.2 x 7.8 mm Tortuosity-mild Calcification-severe Review of the MIP images confirms the above findings. IMPRESSION: 1. Vascular findings and measurements pertinent to potential TAVR procedure, as detailed. 2. Diffuse thickening and coarse calcification of the aortic valve, compatible with reported aortic stenosis. 3. Borderline mild cardiomegaly. Three-vessel coronary atherosclerosis. 4. Small dependent bilateral pleural effusions. 5. Severe centrilobular emphysema with diffuse bronchial wall thickening and saber sheath trachea, suggesting COPD. 6. Hypodense exophytic 1.6 cm anterior interpolar left renal cortical lesion, indeterminate for renal neoplasm, mildly increased from 1.2 cm on 10/25/2018 MRI, not well evaluated on prior MRI due to artifact. Suggest attention on follow-up renal mass protocol CT abdomen without and with IV contrast in 3-6 months. 7. Cholelithiasis. 8. Marked diffuse colonic diverticulosis. 9. Stable left adrenal adenoma. 10. 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 11. Aortic  Atherosclerosis (ICD10-I70.0) and Emphysema (ICD10-J43.9). Electronically Signed   By: Ilona Sorrel M.D.   On: 06/01/2021 14:14   ECHOCARDIOGRAM COMPLETE  Result Date: 05/28/2021    ECHOCARDIOGRAM REPORT   Patient Name:   Kyle Lewis Date of Exam: 05/28/2021 Medical Rec #:  [id number]       Height:       66.0 in Accession #:    [id number]      Weight:       226.6 lb Date of Birth:  [birth year]      BSA:          2.109 m Patient Age:    42 years        BP:           139/81 mmHg Patient Gender: M               HR:           71 bpm. Exam Location:  Inpatient Procedure: 2D Echo, Cardiac Doppler and Color Doppler Indications:    CHF  History:        Patient has prior history of Echocardiogram examinations, most                 recent 01/30/2018. CAD and Previous Myocardial Infarction, COPD,                 Aortic Valve Disease; Risk Factors:Former Smoker, Hypertension                 and Dyslipidemia. Hx stroke.  Sonographer:    Clayton Lefort RDCS (AE) Referring Phys: [place]  1. Left ventricular ejection fraction, by estimation, is approximately 55%. The left ventricle has normal function. Left ventricular endocardial border not optimally defined to evaluate regional wall motion. There is severe concentric left ventricular hypertrophy. Left ventricular diastolic parameters are consistent with Grade I diastolic dysfunction (impaired relaxation). Elevated left ventricular end-diastolic pressure.  2. Right ventricular systolic function is normal. The right ventricular size is normal. There is mildly elevated pulmonary artery systolic pressure. The estimated right ventricular systolic pressure is 37.1 mmHg.  3. Left atrial size was mildly dilated.  4. Right atrial size was mildly dilated.  5. There is trivial pericardial effusion surrounding the apex.  6. The mitral valve is degenerative. Moderate mitral valve regurgitation and very eccentric. The mean mitral valve gradient is 3.0 mmHg. Moderate  mitral annular calcification.  7. Tricuspid valve regurgitation is moderate.  8. The aortic valve is tricuspid. There is severe calcifcation of the aortic valve.  Aortic valve regurgitation is not visualized. Severe aortic valve stenosis. Aortic valve mean gradient measures 56.5 mmHg. Dimentionless index 0.19.  9. The inferior vena cava is dilated in size with <50% respiratory variability, suggesting right atrial pressure of 15 mmHg. Comparison(s): Prior images reviewed side by side. Aortic stenosis has progressed and in severe range. Mitral regurgitation is very eccentric and likely moderate. FINDINGS  Left Ventricle: Left ventricular ejection fraction, by estimation, is 55%. The left ventricle has normal function. Left ventricular endocardial border not optimally defined to evaluate regional wall motion. The left ventricular internal cavity size was normal in size. There is severe concentric left ventricular hypertrophy. Left ventricular diastolic parameters are consistent with Grade I diastolic dysfunction (impaired relaxation). Elevated left ventricular end-diastolic pressure. Right Ventricle: The right ventricular size is normal. No increase in right ventricular wall thickness. Right ventricular systolic function is normal. There is mildly elevated pulmonary artery systolic pressure. The tricuspid regurgitant velocity is 2.37  m/s, and with an assumed right atrial pressure of 15 mmHg, the estimated right ventricular systolic pressure is 02.4 mmHg. Left Atrium: Left atrial size was mildly dilated. Right Atrium: Right atrial size was mildly dilated. Pericardium: Trivial pericardial effusion is present. The pericardial effusion is surrounding the apex. Mitral Valve: The mitral valve is degenerative in appearance. There is mild thickening of the mitral valve leaflet(s). There is mild calcification of the mitral valve leaflet(s). Moderate mitral annular calcification. Moderate mitral valve regurgitation.  MV peak  gradient, 4.9 mmHg. The mean mitral valve gradient is 3.0 mmHg. Tricuspid Valve: The tricuspid valve is grossly normal. Tricuspid valve regurgitation is moderate. Aortic Valve: The aortic valve is tricuspid. There is severe calcifcation of the aortic valve. Aortic valve regurgitation is not visualized. Severe aortic stenosis is present. Aortic valve mean gradient measures 56.5 mmHg. Aortic valve peak gradient measures 93.5 mmHg. Aortic valve area, by VTI measures 0.61 cm. Pulmonic Valve: The pulmonic valve was grossly normal. Pulmonic valve regurgitation is trivial. Aorta: The aortic root is normal in size and structure. Venous: The inferior vena cava is dilated in size with less than 50% respiratory variability, suggesting right atrial pressure of 15 mmHg. IAS/Shunts: No atrial level shunt detected by color flow Doppler.  LEFT VENTRICLE PLAX 2D LVIDd:         4.10 cm   Diastology LVIDs:         2.80 cm   LV e' medial:    4.35 cm/s LV PW:         2.50 cm   LV E/e' medial:  18.2 LV IVS:        2.00 cm   LV e' lateral:   6.85 cm/s LVOT diam:     2.00 cm   LV E/e' lateral: 11.5 LV SV:         67 LV SV Index:   32 LVOT Area:     3.14 cm  RIGHT VENTRICLE             IVC RV Basal diam:  4.40 cm     IVC diam: 2.80 cm RV Mid diam:    3.40 cm RV S prime:     17.80 cm/s TAPSE (M-mode): 2.6 cm LEFT ATRIUM             Index        RIGHT ATRIUM           Index LA diam:        3.90 cm 1.85 cm/m   RA  Area:     23.90 cm LA Vol (A2C):   82.8 ml 39.26 ml/m  RA Volume:   79.30 ml  37.60 ml/m LA Vol (A4C):   76.5 ml 36.27 ml/m LA Biplane Vol: 82.5 ml 39.12 ml/m  AORTIC VALVE AV Area (Vmax):    0.59 cm AV Area (Vmean):   0.58 cm AV Area (VTI):     0.61 cm AV Vmax:           483.50 cm/s AV Vmean:          352.000 cm/s AV VTI:            1.105 m AV Peak Grad:      93.5 mmHg AV Mean Grad:      56.5 mmHg LVOT Vmax:         90.10 cm/s LVOT Vmean:        65.500 cm/s LVOT VTI:          0.213 m LVOT/AV VTI ratio: 0.19  AORTA Ao Root  diam: 3.50 cm Ao Asc diam:  3.10 cm MITRAL VALVE                  TRICUSPID VALVE MV Area (PHT): 2.42 cm       TR Peak grad:   22.5 mmHg MV Area VTI:   2.14 cm       TR Vmax:        237.00 cm/s MV Peak grad:  4.9 mmHg MV Mean grad:  3.0 mmHg       SHUNTS MV Vmax:       1.11 m/s       Systemic VTI:  0.21 m MV Vmean:      76.2 cm/s      Systemic Diam: 2.00 cm MV Decel Time: 313 msec MR Peak grad:    64.0 mmHg MR Mean grad:    39.0 mmHg MR Vmax:         400.00 cm/s MR Vmean:        290.0 cm/s MR PISA:         1.57 cm MR PISA Eff ROA: 12 mm MR PISA Radius:  0.50 cm MV E velocity: 79.10 cm/s MV A velocity: 101.00 cm/s MV E/A ratio:  0.78 Rozann Lesches MD Electronically signed by Rozann Lesches MD Signature Date/Time: 05/28/2021/2:45:46 PM    Final    ECHO INTRAOPERATIVE TEE  Result Date: 06/07/2021  *INTRAOPERATIVE TRANSESOPHAGEAL REPORT *  Patient Name:   Kyle Lewis Date of Exam: 06/07/2021 Medical Rec #:  [id number]       Height:       67.0 in Accession #:    [id number]      Weight:       210.3 lb Date of Birth:  03-Aug-1945      BSA:          2.07 m Patient Age:    18 years        BP:           135/72 mmHg Patient Gender: M               HR:           66 bpm. Exam Location:  Anesthesiology Transesophogeal exam was perform intraoperatively during surgical procedure. Patient was closely monitored under general anesthesia during the entirety of examination. Indications:     Aortic Stenosis i35.0 Sonographer:     Raquel Sarna Senior RDCS Performing Phys: [id number] Alphonsa Overall MCDANIEL Diagnosing Phys:  Jenkins Rouge MD PROCEDURE: Intraoperative Transesophogeal 53mm Edwards S3U TAVR Implanted. Complications: No known complications during this procedure. POST-OP IMPRESSIONS _ Left Ventricle: has normal systolic function. The cavity size was normal. _ Right Ventricle: The cavity was normal. _ Aortic Valve: There is no regurgitation. No regurgitation post repair. The gradient recorded across the prosthetic valve is within the  expected range. _ Mitral Valve: There is no regurgitation. No regurgitation post repair. The gradient recorded across the prosthetic valve is within the expected range. _ Tricuspid Valve: There is no regurgitation. No regurgitation post repair. The gradient recorded across the prosthetic valve is within the expected range. _ Comments: Pre TAVR : tri leaflet calcified with severe AS mean gradient 42 peakl 65 mmHg AVA 0.4 cm2 no AR  Post TAVR: well positioned 29 mm Sapien 3 Ultra valve No PVL mean gradient 5 peak 10 mmhg AVA 2.6 cm2. PRE-OP FINDINGS  Left Ventricle: The left ventricle has normal systolic function, with an ejection fraction of 60-65%. The cavity size was normal. There is moderately increased left ventricular wall thickness. There is moderate left ventricular hypertrophy. Right Ventricle: The right ventricle has normal systolic function. The cavity was normal. There is no increase in right ventricular wall thickness. Left Atrium: Left atrial size was dilated. No left atrial/left atrial appendage thrombus was detected. Right Atrium: Right atrial size was normal in size. Interatrial Septum: No atrial level shunt detected by color flow Doppler. Pericardium: Trivial pericardial effusion is present. The pericardial effusion is anterior to the right ventricle. Trivial pericardial effusion anterior to RV present prior to TAVR and stable with no changes post deployment. Mitral Valve: Thickened. Mitral valve regurgitation is mild by color flow Doppler. Tricuspid Valve: The tricuspid valve was normal in structure. Tricuspid valve regurgitation was not visualized by color flow Doppler. Aortic Valve: The aortic valve has been repaired/replaced Aortic valve regurgitation was not assessed by color flow Doppler. Pre TAVR : tri leaflet calcified with severe AS mean gradient 42 peakl 65 mmHg AVA 0.4 cm2 no AR Post TAVR: well positioned 29 mm Sapien 3 Ultra valve No PVL mean gradient 5 peak 10 mmhg AVA 2.6 cm2. Pulmonic  Valve: The pulmonic valve was normal in structure, with normal. The gradient recorded across the pulmonic valve is within the expected range. Pulmonic valve regurgitation was not assessed by color flow Doppler. Venous: The inferior vena cava is normal in size with greater than 50% respiratory variability, suggesting right atrial pressure of 3 mmHg. +--------------+--------++  LEFT VENTRICLE            +--------------+--------++  PLAX 2D                   +--------------+--------++  LVOT diam:     2.30 cm    +--------------+--------++  LVOT Area:     4.15 cm   +--------------+--------++                            +--------------+--------++ +------------------+------------++  AORTIC VALVE                      +------------------+------------++  AV Area (Vmax):    2.98 cm       +------------------+------------++  AV Area (Vmean):   3.01 cm       +------------------+------------++  AV Area (VTI):     2.55 cm       +------------------+------------++  AV Vmax:  155.00 cm/s    +------------------+------------++  AV Vmean:          102.000 cm/s   +------------------+------------++  AV VTI:            0.298 m        +------------------+------------++  AV Peak Grad:      9.6 mmHg       +------------------+------------++  AV Mean Grad:      5.0 mmHg       +------------------+------------++  LVOT Vmax:         111.00 cm/s    +------------------+------------++  LVOT Vmean:        73.800 cm/s    +------------------+------------++  LVOT VTI:          0.183 m        +------------------+------------++  LVOT/AV VTI ratio: 0.61           +------------------+------------++ +-------------+--------++  MITRAL VALVE             +--------------+-------+ +-------------+--------++  SHUNTS                   MV Peak grad: 3.6 mmHg   +--------------+-------+ +-------------+--------++  Systemic VTI:  0.18 m    MV Vmax:      0.95 m/s   +--------------+-------+ +-------------+--------++  Systemic Diam: 2.30 cm                             +--------------+-------+  Jenkins Rouge MD Electronically signed by Jenkins Rouge MD Signature Date/Time: 06/07/2021/9:59:43 AM    Final    VAS US CAROTID  Result Date: 06/01/2021 Carotid Arterial Duplex Study Patient Name:  Kyle Lewis  Date of Exam:   06/01/2021 Medical Rec #: [id number]        Accession #:    [id number] Date of Birth: Oct 31, 1945       Patient Gender: M Patient Age:   17 years Exam Location:  [facility] Procedure:      VAS US CAROTID Referring Phys: Barb Merino --------------------------------------------------------------------------------  Indications:       Stenosis. Risk Factors:      Hypertension, current smoker. Limitations        Today's exam was limited due to the body habitus of the                    patient, the high bifurcation of the carotid, heavy                    calcification and the resulting shadowing, the patient's                    respiratory variation and patient movement. Comparison Study:  No prior studies. Performing Technologist: Oliver Hum RVT  Examination Guidelines: A complete evaluation includes B-mode imaging, spectral Doppler, color Doppler, and power Doppler as needed of all accessible portions of each vessel. Bilateral testing is considered an integral part of a complete examination. Limited examinations for reoccurring indications may be performed as noted.  Right Carotid Findings: +----------+--------+--------+--------+-----------------------+--------+             PSV cm/s EDV cm/s Stenosis Plaque Description      Comments  +----------+--------+--------+--------+-----------------------+--------+  CCA Prox   65       1                 smooth and heterogenous           +----------+--------+--------+--------+-----------------------+--------+  CCA Distal 43       4                 smooth and heterogenous           +----------+--------+--------+--------+-----------------------+--------+  ICA Prox                     Occluded calcific                           +----------+--------+--------+--------+-----------------------+--------+  ICA Distal                   Occluded                                   +----------+--------+--------+--------+-----------------------+--------+  ECA        75       7                                                   +----------+--------+--------+--------+-----------------------+--------+ +----------+--------+-------+--------+-------------------+             PSV cm/s EDV cms Describe Arm Pressure (mmHG)  +----------+--------+-------+--------+-------------------+  Subclavian 77                                             +----------+--------+-------+--------+-------------------+ +---------+--------+---+--------+--+---------+  Vertebral PSV cm/s 167 EDV cm/s 63 Antegrade  +---------+--------+---+--------+--+---------+  Left Carotid Findings: +----------+--------+--------+--------+-----------------------+--------+             PSV cm/s EDV cm/s Stenosis Plaque Description      Comments  +----------+--------+--------+--------+-----------------------+--------+  CCA Prox   90       22                smooth and heterogenous           +----------+--------+--------+--------+-----------------------+--------+  CCA Distal 49       20                smooth and heterogenous           +----------+--------+--------+--------+-----------------------+--------+  ICA Prox   121      50       40-59%   calcific                tortuous  +----------+--------+--------+--------+-----------------------+--------+  ICA Mid    114      49       40-59%                                     +----------+--------+--------+--------+-----------------------+--------+  ICA Distal 68       35                                        tortuous  +----------+--------+--------+--------+-----------------------+--------+  ECA        70       5                                                   +----------+--------+--------+--------+-----------------------+--------+  +----------+--------+--------+--------+-------------------+  PSV cm/s EDV cm/s Describe Arm Pressure (mmHG)  +----------+--------+--------+--------+-------------------+  Subclavian 144                                             +----------+--------+--------+--------+-------------------+ +---------+--------+--+--------+--+---------+  Vertebral PSV cm/s 36 EDV cm/s 16 Antegrade  +---------+--------+--+--------+--+---------+   Summary: Right 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 Left Carotid: Velocities in the left ICA are consistent with a 40-59% stenosis. Vertebrals: Bilateral vertebral arteries demonstrate antegrade flow. *See table(s) above for measurements and observations.  Electronically signed by Jamelle Haring on 06/01/2021 at 3:21:01 PM.    Final    Structural Heart Procedure  Result Date: 06/07/2021 See surgical note for result.  CT Angio Abd/Pel w/ and/or w/o  Result Date: 06/01/2021 CLINICAL DATA:  Inpatient. 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 Aortic stenosis. EXAM: CT ANGIOGRAPHY CHEST, ABDOMEN AND PELVIS TECHNIQUE: Multidetector CT imaging through the chest, abdomen and pelvis was performed using the standard protocol during bolus administration of intravenous contrast. Multiplanar reconstructed images and MIPs were obtained and reviewed to evaluate the vascular anatomy. CONTRAST:  132mL OMNIPAQUE IOHEXOL 350 MG/ML SOLN COMPARISON:  06/26/2017 CT angiogram of the chest, abdomen and pelvis. 05/12/2018 unenhanced CT abdomen/pelvis. 10/25/2018 MRI abdomen. FINDINGS: CTA CHEST FINDINGS Cardiovascular: Borderline mild cardiomegaly. No significant pericardial effusion/thickening. Diffuse thickening and coarse calcification of the aortic valve. Three-vessel coronary atherosclerosis. Atherosclerotic nonaneurysmal thoracic aorta. Top-normal caliber main pulmonary artery (3.1 cm diameter). No central pulmonary emboli. Mediastinum/Nodes: No discrete  thyroid nodules. Unremarkable esophagus. No pathologically enlarged axillary, mediastinal or hilar lymph nodes. Lungs/Pleura: No pneumothorax. Small dependent bilateral pleural effusions. Severe centrilobular emphysema with diffuse bronchial wall thickening. Saber sheath trachea. Right upper lobe 0.3 cm solid pulmonary nodule (series 8/image 64), stable since 06/26/2017 chest CT. Mild-to-moderate dependent bilateral lower lobe atelectasis. No lung masses or additional significant pulmonary nodules in the aerated portions of the lungs. Musculoskeletal: No aggressive appearing focal osseous lesions. Mild thoracic spondylosis. Marked degenerative disc disease throughout the cervical spine. CTA ABDOMEN AND PELVIS FINDINGS Hepatobiliary: Normal liver size. Several scattered subcentimeter simple liver cysts throughout the liver, not appreciably changed from 10/25/2018 MRI abdomen. No appreciable new liver lesions. Cholelithiasis. No biliary ductal dilatation. Pancreas: Normal, with no mass or duct dilation. Spleen: Normal size. No mass. Adrenals/Urinary Tract: Normal right adrenal. Left adrenal 2.3 cm nodule with density 44 HU, stable since at least 05/12/2018 unenhanced CT abdomen study, compatible with an adenoma. Hypodense exophytic 1.6 cm anterior interpolar left renal cortical lesion (series 7/image 150), indeterminate, mildly increased from 1.2 cm on 10/25/2018 MRI, not well evaluated on prior MRI due to artifact. Simple 2.2 cm anterior lower left renal cortical cyst. Additional scattered subcentimeter hypodense bilateral renal cortical lesions are too small to characterize. No hydronephrosis. Chronic mild diffuse bladder wall thickening and trabeculation, similar. Small superior bladder wall diverticula. Stomach/Bowel: Normal non-distended stomach. Postsurgical changes from prior small bowel resection with enteroenterostomy in the mid abdomen. No unexpected small bowel dilatation. No small bowel wall thickening.  Marked diffuse colonic diverticulosis, most prominent in the sigmoid colon, with no acute large bowel wall thickening or pericolonic fat stranding. 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 Ectatic 2.8 cm diameter right common iliac artery, increased from 2.4 cm on 06/26/2017 CT. No pathologically enlarged lymph nodes  in the abdomen or pelvis. Reproductive: Top-normal size prostate with postsurgical changes from TURP in the right prostate. Nonspecific coarse internal prostatic calcifications. Other: No pneumoperitoneum, ascites or focal fluid collection. Musculoskeletal: No aggressive appearing focal osseous lesions. Bilateral posterior spinal fusion hardware L2-L5. 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 VASCULAR MEASUREMENTS PERTINENT TO TAVR: AORTA: Minimal Aortic Diameter-18.1 x 17.1 mm Severity of Aortic Calcification-severe RIGHT PELVIS: Right Common Iliac Artery - Minimal Diameter-11.8 x 10.9 mm Tortuosity-mild Calcification-moderate (predominantly stented) Right External Iliac Artery - Minimal Diameter-7.4 x 6.2 mm Tortuosity-severe Calcification-severe Right Common Femoral Artery - Minimal Diameter-9.1 x 8.5 mm Tortuosity-mild Calcification-severe LEFT PELVIS: Left Common Iliac Artery - Minimal Diameter-9.7 x 9.1 mm Tortuosity-mild-to-moderate Calcification-severe (entirely stented) Left External Iliac Artery - Minimal Diameter-7.2 x 6.6 mm Tortuosity-mild Calcification-severe (stented proximally) Left Common Femoral Artery - Minimal Diameter-9.2 x 7.8 mm Tortuosity-mild Calcification-severe Review of the MIP images confirms the above findings. IMPRESSION: 1. Vascular findings and measurements pertinent to potential TAVR procedure, as detailed. 2. Diffuse thickening and coarse calcification of the aortic valve, compatible with reported aortic stenosis. 3.  Borderline mild cardiomegaly. Three-vessel coronary atherosclerosis. 4. Small dependent bilateral pleural effusions. 5. Severe centrilobular emphysema with diffuse bronchial wall thickening and saber sheath trachea, suggesting COPD. 6. Hypodense exophytic 1.6 cm anterior interpolar left renal cortical lesion, indeterminate for renal neoplasm, mildly increased from 1.2 cm on 10/25/2018 MRI, not well evaluated on prior MRI due to artifact. Suggest attention on follow-up renal mass protocol CT abdomen without and with IV contrast in 3-6 months. 7. Cholelithiasis. 8. Marked diffuse colonic diverticulosis. 9. Stable left adrenal adenoma. 10. 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 11. Aortic Atherosclerosis (ICD10-I70.0) and Emphysema (ICD10-J43.9). Electronically Signed   By: Ilona Sorrel M.D.   On: 06/01/2021 14:14    Microbiology: Recent Results (from the past 240 hour(s))  Surgical pcr screen     Status: None   Collection Time: 06/06/21 11:41 PM   Specimen: Nasal Mucosa; Nasal Swab  Result Value Ref Range Status   MRSA, PCR NEGATIVE NEGATIVE Final   Staphylococcus aureus NEGATIVE NEGATIVE Final    Comment: (NOTE) The Xpert SA Assay (FDA approved for NASAL specimens in patients 69 years of age and older), is one component of a comprehensive surveillance program. It is not intended to diagnose infection nor to guide or monitor treatment. Performed at [facility], [place] [address], [place], [place] [address]      Labs: Basic Metabolic Panel: Recent Labs  Lab 06/03/21 0933 06/06/21 0133 06/06/21 2028 06/07/21 0341 06/07/21 0807 06/07/21 1041 06/08/21 0504  NA 138 137 136 137 137 138 137  K 4.4 4.4 4.6 4.1 4.4 4.3 4.3  CL 91* 94* 95* 95* 96* 96* 97*  CO2 39* 36* 33* 37*  --   --  32  GLUCOSE 115* 107* 118* 104* 113* 132* 116*  BUN 36* 23 24* 22 22 21 22    CREATININE 0.80 0.89 0.86 0.80 0.70 0.70 0.92  CALCIUM 8.6* 8.5* 8.7* 8.4*  --   --  8.4*  MG  --   --   --   --   --   --  2.1   Liver Function Tests: No results for input(s): AST, ALT, ALKPHOS, BILITOT, PROT, ALBUMIN in the last 168 hours. No results for input(s): LIPASE, AMYLASE in the last 168 hours. No results for input(s): AMMONIA in the last 168  hours. CBC: Recent Labs  Lab 06/06/21 2028 06/07/21 0807 06/07/21 1041 06/08/21 0504  WBC 6.6  --   --  7.1  HGB 14.8 14.6 14.6 13.6  HCT 44.9 43.0 43.0 41.0  MCV 94.3  --   --  95.6  PLT 147*  --   --  150   Cardiac Enzymes: No results for input(s): CKTOTAL, CKMB, CKMBINDEX, TROPONINI in the last 168 hours. BNP: BNP (last 3 results) Recent Labs    05/27/21 1420  BNP 1,348.7*    ProBNP (last 3 results) No results for input(s): PROBNP in the last 8760 hours.  CBG: Recent Labs  Lab 06/06/21 2054 06/07/21 0533 06/07/21 1630 06/07/21 2106 06/08/21 0556  GLUCAP 135* 104* 146* 255* 120*       Signed:  Florencia Reasons MD, PhD, FACP  [facility] 06/08/2021, 10:14 AM

## 2021-06-08 NOTE — TOC Initial Note (Signed)
Transition of Care (TOC) - Initial/Assessment Note  Marvetta Gibbons RN, BSN Transitions of Care Unit 4E- RN Case Manager See Treatment Team for direct phone #    Patient Details  Name: Kyle Lewis MRN: [id number] Date of Birth: 11-28-1945  Transition of Care [facility]) CM/SW Contact:    Dawayne Patricia, RN Phone Number: 06/08/2021, 12:36 PM  Clinical Narrative:                 Pt from home w/ wife, admitted with CHF now s/p TAVR on 1/3. Orders have been placed for [facility] and DME -home oxygen needs.  CM in to speak with pt at bedside and offer [facility] choice. List provided Per CMS guidelines from medicare.gov website with star ratings (copy placed in shadow chart)  Per discussion with pt he is agreeable to [facility] services- has selected [facility] as first choice and [facility] as backup. Street address provided by pt as [address] [place] [address], phone # and PCP confirmed in epic.   Also discussed DME with pt and home 02 needs- per pt his wife is currently on home 02 and is asking if they can "share" home oxygen. Explained to pt that this was not possible as concentrators usually do not have 2 ports on them for multiple "hook ups" as it would be unsafe. And also they can not "share" concentrator as she needs her oxygen and he needs his both continuously. Pt voiced understanding. Pt also reports he uses a cpap or bipap at home (not sure which it is) at night. Asked pt what company wife uses for home 02 and if he wanted to use the same- per pt provider is [employer] and he would like to use them as well.    Call made to [facility] with [place] for home 02 referral- confirmed services with wife- and referral made for patient for new home 02 needs. Discussed with Caryl Pina pt and wife request to "share" her home 80- which Caryl Pina confirmed was not an option due to safety and concentrator capacity for liter flow, etc. [employer] will follow up with wife to also explain that "sharing" is not an option. Pt will  have his own concentrator delivered to the home, as well as a transport tank delivered here to room for transport home.   Call made to [facility] for HHPT/SW referral- referral has been accepted. They will contact pt post discharge for start of care.    Expected Discharge Plan: [place] [facility] Barriers to Discharge: Other (must enter comment) (needs to work with therapy for safe discharge)   Patient Goals and CMS Choice Patient states their goals for this hospitalization and ongoing recovery are:: return home with wife CMS Medicare.gov Compare Post Acute Care list provided to:: Patient Choice offered to / list presented to : Patient  Expected Discharge Plan and Services Expected Discharge Plan: [place]   Discharge Planning Services: CM Consult Post Acute Care Choice: Durable Medical Equipment, Home Health Living arrangements for the past 2 months: Single Family Home Expected Discharge Date: 06/08/21               DME Arranged: Oxygen DME Agency: [name] Date DME Agency Contacted: 06/08/21 Time DME Agency Contacted: [id number] Representative spoke with at DME Agency: [facility] Arranged: PT, Social Work [facility] Agency: [facility] Date [place]: 06/08/21 Time [place]: [id number] Representative spoke with at [place]  Prior Living Arrangements/Services Living  arrangements for the past 2 months: [place] Lives with:: Spouse Patient language and need for interpreter reviewed:: Yes Do you feel safe going back to the place where you live?: Yes      Need for Family Participation in Patient Care: Yes (Comment) Care giver support system in place?: Yes (comment) Current home services: DME (Cane, walker, shower chair, cpap) Criminal Activity/Legal Involvement Pertinent to Current Situation/Hospitalization: No - Comment as needed  Activities of Daily Living Home Assistive Devices/Equipment: Eyeglasses, Cane (specify quad  or straight), Oxygen, BIPAP ADL Screening (condition at time of admission) Patient's cognitive ability adequate to safely complete daily activities?: Yes Is the patient deaf or have difficulty hearing?: 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) Does the patient have difficulty seeing, even when wearing glasses/contacts?: No Does the patient have difficulty concentrating, remembering, or making decisions?: Yes (forgetful) Patient able to express need for assistance with ADLs?: Yes Does the patient have difficulty dressing or bathing?: No Independently performs ADLs?: Yes (appropriate for developmental age) Does the patient have difficulty walking or climbing stairs?: Yes (gets sob) Weakness of Legs: None Weakness of Arms/Hands: None  Permission Sought/Granted Permission sought to share information with : Facility Art therapist granted to share information with : Yes, Verbal Permission Granted  Share Information with NAME: [name]  Permission granted to share info w AGENCY: HH/[facility]  Permission granted to share info w Relationship: Wife     Emotional Assessment Appearance:: Appears stated age Attitude/Demeanor/Rapport: Engaged Affect (typically observed): Appropriate, Accepting Orientation: : Oriented to Self, Oriented to Place, Oriented to  Time, Oriented to Situation Alcohol / Substance Use: Not Applicable Psych Involvement: No (comment)  Admission diagnosis:  Acute CHF (congestive heart failure) (HCC) [I50.9] Acute congestive heart failure, unspecified heart failure type ([place]) [I50.9] Patient Active Problem List   Diagnosis Date Noted   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   Mitral regurgitation 06/02/2021   Acute on chronic diastolic heart failure ([place])    Acute respiratory failure with hypoxia and hypercapnia ([place]) 05/27/2021   Anxiety 04/08/2020   Body mass index (BMI) 34.0-34.9, adult 04/07/2020   Abnormal gait 07/31/2019   Inflammation of  sacroiliac joint ([place]) 05/06/2019   COPD GOLD II 04/07/2019   Neck pain 03/25/2019   Preop cardiovascular exam 10/24/2018   Cigarette smoker 09/05/2018   CAD (coronary artery disease) 02/01/2018   Severe aortic stenosis 02/01/2018   Status post AAA (abdominal aortic aneurysm) repair 02/01/2018   Pre-procedure lab exam 01/19/2018   Hyperlipidemia LDL goal <70 01/19/2018   History of craniotomy 01/11/2018   Blister of multiple sites of lower extremity 01/10/2018   Other specified postprocedural states 09/25/2017   Osteoarthritis of right glenohumeral joint 08/14/2017   Lower urinary tract symptoms (LUTS) 08/06/2017   S/P lumbar spinal fusion 07/07/2015   Iliac artery aneurysm, left ([place]) 07/01/2015   Peripheral vascular disease ([place]) 07/01/2015   Degeneration of lumbar intervertebral disc 06/14/2015   History of right MCA stroke 07/21/2014   OAB (overactive bladder) 01/19/2014   Stroke ([place]) 01/01/2014   Exogenous obesity 11/08/2013   Hypertonicity of bladder 11/08/2013   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   Microscopic hematuria 11/08/2013   Tobacco use 11/08/2013   Nocturia 11/08/2013   Ventral hernia 11/08/2013   Carotid arterial disease ([place]) 05/23/2012   Hypertension    OSA (obstructive sleep apnea)    Chronic back pain    BPH with obstruction/lower urinary tract symptoms    OA (osteoarthritis) of knee 04/01/2012   DEPRESSION  03/17/2010   DIVERTICULOSIS, COLON 03/17/2010   COLONIC POLYPS, ADENOMATOUS, HX OF 03/17/2010   PCP:  [name], MD Pharmacy:   [facility] [address] [place] [address] [place] [address] Phone: [phone] Fax: [phone]     Social Determinants of Health (SDOH) Interventions    Readmission Risk Interventions No flowsheet data found.

## 2021-06-08 NOTE — Progress Notes (Addendum)
[place] VALVE TEAM  Patient Name: Kyle Lewis Date of Encounter: 06/08/2021  Admit date: 05/27/2021  Primary Care Provider: [name], MD [facility] Cardiologist: Jenean Lindau, MD / Dr. Angelena Form & Dr. Cyndia Bent (TAVR) [facility] Electrophysiologist:  None   Hospital Problem List     Principal Problem:   Acute respiratory failure with hypoxia and hypercapnia ([place]) Active Problems:   Hypertension   OSA (obstructive sleep apnea)   History of right MCA stroke   Exogenous obesity   Tobacco use   Hyperlipidemia LDL goal <70   CAD (coronary artery disease)   Severe aortic stenosis   COPD GOLD II   Acute on chronic diastolic heart failure (HCC)   Mitral regurgitation   S/P TAVR (transcatheter aortic valve replacement)     Subjective   Feeling well with no complaints. Hasn't been up walking today.  Inpatient Medications    Scheduled Meds:  aspirin EC  81 mg Oral Daily   feeding supplement  237 mL Oral BID BM   finasteride  5 mg Oral Daily   fluticasone furoate-vilanterol  1 puff Inhalation Daily   furosemide  40 mg Oral Daily   gabapentin  300 mg Oral BID   insulin aspart  0-5 Units Subcutaneous QHS   insulin aspart  0-9 Units Subcutaneous TID WC   ipratropium-albuterol  3 mL Nebulization BID   mirabegron ER  25 mg Oral Daily   sodium chloride flush  3 mL Intravenous Q12H   sodium chloride flush  3 mL Intravenous Q12H   tamsulosin  0.4 mg Oral QPC supper   Continuous Infusions:  sodium chloride     sodium chloride     nitroGLYCERIN     PRN Meds: sodium chloride, sodium chloride, acetaminophen **OR** acetaminophen, morphine injection, ondansetron **OR** ondansetron (ZOFRAN) IV, oxyCODONE, sodium chloride flush, sodium chloride flush, traMADol   Vital Signs    Vitals:   06/07/21 2303 06/07/21 2320 06/08/21 0417 06/08/21 0745  BP:  116/64 (!) 142/74 (!) 148/77  Pulse: 80 75 83 88  Resp: 16 19 10 13    Temp:  98 F (36.7 C) 97.6 F (36.4 C) 97.9 F (36.6 C)  TempSrc:  Oral Axillary Axillary  SpO2: 96% 95% 92% 94%  Weight:   97.4 kg   Height:        Intake/Output Summary (Last 24 hours) at 06/08/2021 0842 Last data filed at 06/08/2021 0559 Gross per 24 hour  Intake 1575.5 ml  Output 1175 ml  Net 400.5 ml   Filed Weights   06/06/21 0400 06/07/21 0443 06/08/21 0417  Weight: 97 kg 95.4 kg 97.4 kg    Physical Exam    GEN: Well nourished, well developed, in no acute distress. obese HEENT: Grossly normal.  Neck: Supple, no JVD, carotid bruits, or masses. Cardiac: RRR, no murmurs, rubs, or gallops. No clubbing, cyanosis, edema.   Respiratory:  some wheezing GI: Soft, nontender, nondistended, BS + x 4. MS: no deformity or atrophy. Skin: warm and dry, no rash.  Groin sites clear without hematoma or ecchymosis., There is ecchymosis  in bilateral lower abdominal quadrants from lovenox Neuro:  Strength and sensation are intact. Psych: AAOx3.  Normal affect.  Labs    CBC Recent Labs    06/06/21 2028 06/07/21 0807 06/07/21 1041 06/08/21 0504  WBC 6.6  --   --  7.1  HGB 14.8   < > 14.6 13.6  HCT 44.9   < >  43.0 41.0  MCV 94.3  --   --  95.6  PLT 147*  --   --  150   < > = values in this interval not displayed.   Basic Metabolic Panel Recent Labs    06/07/21 0341 06/07/21 0807 06/07/21 1041 06/08/21 0504  NA 137   < > 138 137  K 4.1   < > 4.3 4.3  CL 95*   < > 96* 97*  CO2 37*  --   --  32  GLUCOSE 104*   < > 132* 116*  BUN 22   < > 21 22  CREATININE 0.80   < > 0.70 0.92  CALCIUM 8.4*  --   --  8.4*  MG  --   --   --  2.1   < > = values in this interval not displayed.   Liver Function Tests No results for input(s): AST, ALT, ALKPHOS, BILITOT, PROT, ALBUMIN in the last 72 hours. No results for input(s): LIPASE, AMYLASE in the last 72 hours. Cardiac Enzymes No results for input(s): CKTOTAL, CKMB, CKMBINDEX, TROPONINI in the last 72 hours. BNP Invalid input(s):  POCBNP D-Dimer No results for input(s): DDIMER in the last 72 hours. Hemoglobin A1C Recent Labs    06/06/21 2028  HGBA1C 5.5   Fasting Lipid Panel No results for input(s): CHOL, HDL, LDLCALC, TRIG, CHOLHDL, LDLDIRECT in the last 72 hours. Thyroid Function Tests No results for input(s): TSH, T4TOTAL, T3FREE, THYROIDAB in the last 72 hours.  Invalid input(s): FREET3  Telemetry    sinus - Personally Reviewed  ECG    Sinus with IVCD and LAD.  - Personally Reviewed  Radiology    DG Chest 2 View  Result Date: 06/06/2021 CLINICAL DATA:  Preoperative assessment. EXAM: CHEST - 2 VIEW COMPARISON:  05/27/2021. FINDINGS: The heart size and mediastinal contours are stable. Atherosclerotic calcification of the aorta is noted. Lung volumes are low and patchy airspace disease is present at the lung bases. There is mild blunting of the costophrenic angles bilaterally. No pneumothorax. No acute osseous abnormality. IMPRESSION: 1. Low lung volumes with patchy atelectasis or infiltrate at the lung bases. 2. Small bilateral pleural effusions. 3. Aortic atherosclerosis. Electronically Signed   By: Brett Fairy M.D.   On: 06/06/2021 20:45   ECHO INTRAOPERATIVE TEE  Result Date: 06/07/2021  *INTRAOPERATIVE TRANSESOPHAGEAL REPORT *  Patient Name:   Kyle Lewis Date of Exam: 06/07/2021 Medical Rec #:  [id number]       Height:       67.0 in Accession #:    [id number]      Weight:       210.3 lb Date of Birth:  26-Feb-1946      BSA:          2.07 m Patient Age:    76 years        BP:           135/72 mmHg Patient Gender: M               HR:           66 bpm. Exam Location:  Anesthesiology Transesophogeal exam was perform intraoperatively during surgical procedure. Patient was closely monitored under general anesthesia during the entirety of examination. Indications:     Aortic Stenosis i35.0 Sonographer:     Raquel Sarna Senior RDCS Performing Phys: [place] Diagnosing Phys: Jenkins Rouge MD PROCEDURE:  Intraoperative Transesophogeal 47mm Edwards S3U TAVR Implanted. Complications: No known  complications during this procedure. POST-OP IMPRESSIONS _ Left Ventricle: has normal systolic function. The cavity size was normal. _ Right Ventricle: The cavity was normal. _ Aortic Valve: There is no regurgitation. No regurgitation post repair. The gradient recorded across the prosthetic valve is within the expected range. _ Mitral Valve: There is no regurgitation. No regurgitation post repair. The gradient recorded across the prosthetic valve is within the expected range. _ Tricuspid Valve: There is no regurgitation. No regurgitation post repair. The gradient recorded across the prosthetic valve is within the expected range. _ Comments: Pre TAVR : tri leaflet calcified with severe AS mean gradient 42 peakl 65 mmHg AVA 0.4 cm2 no AR  Post TAVR: well positioned 29 mm Sapien 3 Ultra valve No PVL mean gradient 5 peak 10 mmhg AVA 2.6 cm2. PRE-OP FINDINGS  Left Ventricle: The left ventricle has normal systolic function, with an ejection fraction of 60-65%. The cavity size was normal. There is moderately increased left ventricular wall thickness. There is moderate left ventricular hypertrophy. Right Ventricle: The right ventricle has normal systolic function. The cavity was normal. There is no increase in right ventricular wall thickness. Left Atrium: Left atrial size was dilated. No left atrial/left atrial appendage thrombus was detected. Right Atrium: Right atrial size was normal in size. Interatrial Septum: No atrial level shunt detected by color flow Doppler. Pericardium: Trivial pericardial effusion is present. The pericardial effusion is anterior to the right ventricle. Trivial pericardial effusion anterior to RV present prior to TAVR and stable with no changes post deployment. Mitral Valve: Thickened. Mitral valve regurgitation is mild by color flow Doppler. Tricuspid Valve: The tricuspid valve was normal in structure.  Tricuspid valve regurgitation was not visualized by color flow Doppler. Aortic Valve: The aortic valve has been repaired/replaced Aortic valve regurgitation was not assessed by color flow Doppler. Pre TAVR : tri leaflet calcified with severe AS mean gradient 42 peakl 65 mmHg AVA 0.4 cm2 no AR Post TAVR: well positioned 29 mm Sapien 3 Ultra valve No PVL mean gradient 5 peak 10 mmhg AVA 2.6 cm2. Pulmonic Valve: The pulmonic valve was normal in structure, with normal. The gradient recorded across the pulmonic valve is within the expected range. Pulmonic valve regurgitation was not assessed by color flow Doppler. Venous: The inferior vena cava is normal in size with greater than 50% respiratory variability, suggesting right atrial pressure of 3 mmHg. +--------------+--------++  LEFT VENTRICLE            +--------------+--------++  PLAX 2D                   +--------------+--------++  LVOT diam:     2.30 cm    +--------------+--------++  LVOT Area:     4.15 cm   +--------------+--------++                            +--------------+--------++ +------------------+------------++  AORTIC VALVE                      +------------------+------------++  AV Area (Vmax):    2.98 cm       +------------------+------------++  AV Area (Vmean):   3.01 cm       +------------------+------------++  AV Area (VTI):     2.55 cm       +------------------+------------++  AV Vmax:           155.00 cm/s    +------------------+------------++  AV Vmean:  102.000 cm/s   +------------------+------------++  AV VTI:            0.298 m        +------------------+------------++  AV Peak Grad:      9.6 mmHg       +------------------+------------++  AV Mean Grad:      5.0 mmHg       +------------------+------------++  LVOT Vmax:         111.00 cm/s    +------------------+------------++  LVOT Vmean:        73.800 cm/s    +------------------+------------++  LVOT VTI:          0.183 m        +------------------+------------++  LVOT/AV VTI  ratio: 0.61           +------------------+------------++ +-------------+--------++  MITRAL VALVE             +--------------+-------+ +-------------+--------++  SHUNTS                   MV Peak grad: 3.6 mmHg   +--------------+-------+ +-------------+--------++  Systemic VTI:  0.18 m    MV Vmax:      0.95 m/s   +--------------+-------+ +-------------+--------++  Systemic Diam: 2.30 cm                            +--------------+-------+  Jenkins Rouge MD Electronically signed by Jenkins Rouge MD Signature Date/Time: 06/07/2021/9:59:43 AM    Final    Structural Heart Procedure  Result Date: 06/07/2021 See surgical note for result.   Cardiac Studies   Echocardiogram 05/28/21:   1. Left ventricular ejection fraction, by estimation, is approximately  55%. The left ventricle has normal function. Left ventricular endocardial  border not optimally defined to evaluate regional wall motion. There is  severe concentric left ventricular  hypertrophy. Left ventricular diastolic parameters are consistent with  Grade I diastolic dysfunction (impaired relaxation). Elevated left  ventricular end-diastolic pressure.   2. Right ventricular systolic function is normal. The right ventricular  size is normal. There is mildly elevated pulmonary artery systolic  pressure. The estimated right ventricular systolic pressure is 78.5 mmHg.   3. Left atrial size was mildly dilated.   4. Right atrial size was mildly dilated.   5. There is trivial pericardial effusion surrounding the apex.   6. The mitral valve is degenerative. Moderate mitral valve regurgitation  and very eccentric. The mean mitral valve gradient is 3.0 mmHg. Moderate  mitral annular calcification.   7. Tricuspid valve regurgitation is moderate.   8. The aortic valve is tricuspid. There is severe calcifcation of the  aortic valve. Aortic valve regurgitation is not visualized. Severe aortic  valve stenosis. Aortic valve mean gradient measures 56.5 mmHg.   Dimentionless index 0.19.   9. The inferior vena cava is dilated in size with <50% respiratory  variability, suggesting right atrial pressure of 15 mmHg.   Comparison(s): Prior images reviewed side by side. Aortic stenosis has  progressed and in severe range. Mitral regurgitation is very eccentric and  likely moderate.    R/LHC 05/31/21:     Prox LAD lesion is 25% stenosed.   Dist Cx lesion is 40% stenosed.   Ost 2nd Mrg to 2nd Mrg lesion is 70% stenosed.   Dist RCA lesion is 60% stenosed.   Ost RPDA to RPDA lesion is 40% stenosed.   The LAD is a large caliber vessel that courses to the apex. There is  mild mid LAD stenosis The Circumflex is a large caliber vessel that gives off a small obtuse marginal branch then 2 moderate caliber obtuse marginal branches. The second obtuse marginal branch has a moderate stenosis, unchanged from last cath in 2019.  The RCA is a large dominant artery. The distal RCA has a moderate stenosis, unchanged from last cath in 2019.  Severe aortic stenosis (mean gradient 50.6 mmHg, 59 mmHg, AVA 0.93 cm2).    Recommendations: Will continue workup for TAVR. His right and left heart pressures are only mildly abnormal following inpatient diuresis. Continue diuresis. The structural heart team will provide a formal consultation regarding his candidacy for TAVR.    CTA 06/01/21: IMPRESSION: 1. 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   2. Annular measurements support a 29 mm S3 TAVR (574 mm2).   3. No significant annular or subannular calcifications.   4. Shallow left main height concerning for increased risk for coronary obstruction (9.2 mm).   5. Optimal Fluoroscopic Angle for Delivery: RAO 3 CAU 15   6. Dilated pulmonary artery suggestive of pulmonary hypertension.   CT chest 06/01/21:   IMPRESSION: 1. Vascular findings and measurements pertinent to potential TAVR procedure, as detailed. 2. Diffuse thickening and coarse calcification of the  aortic valve, compatible with reported aortic stenosis. 3. Borderline mild cardiomegaly. Three-vessel coronary atherosclerosis. 4. Small dependent bilateral pleural effusions. 5. Severe centrilobular emphysema with diffuse bronchial wall thickening and saber sheath trachea, suggesting COPD. 6. Hypodense exophytic 1.6 cm anterior interpolar left renal cortical lesion, indeterminate for renal neoplasm, mildly increased from 1.2 cm on 10/25/2018 MRI, not well evaluated on prior MRI due to artifact. Suggest attention on follow-up renal mass protocol CT abdomen without and with IV contrast in 3-6 months. 7. Cholelithiasis. 8. Marked diffuse colonic diverticulosis. 9. Stable left adrenal adenoma. 10. 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 11. Aortic Atherosclerosis (ICD10-I70.0) and Emphysema (ICD10-J43.9).   Orthopantogram 06/01/21:   FINDINGS: Patient motion blurs the central incisors. Dental fillings are present in the upper and lower molars bilaterally. No significant dental caries are present. No significant periapical lucencies are present. The mandible is intact and located.   IMPRESSION: 1. Dental fillings in the upper and lower molars bilaterally. 2. No significant dental caries.   ____________________   TAVR OPERATIVE NOTE     Date of Procedure:                06/07/2021   Preoperative Diagnosis:      Severe Aortic Stenosis    Postoperative Diagnosis:    Same    Procedure:        Transcatheter Aortic Valve Replacement - Percutaneous Right Transfemoral Approach             Edwards Sapien 3 THV (size 29 mm, model # 9600TFX, serial # [id number])              Co-Surgeons:                        Gaye Pollack, MD and Lauree Chandler, MD     Anesthesiologist:                  Jenita Seashore, MD   Echocardiographer:              Edmonia James, MD    Pre-operative Echo Findings: Severe aortic stenosis Normal left ventricular systolic  function   Post-operative Echo Findings: No paravalvular leak Normal left ventricular systolic function    __________________   Echo 06/08/21: completed but pending formal read at the time of discharge   Patient Profile     Kyle Lewis is a 76 y.o. 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 Structural heart was consulted for consideration of inpatient TAVR.  Assessment & Plan    Acute hypoxic respiratory failure: multifactorial due to underlying COPD, aortic stenosis, and diastolic heart failure. Now resolved.   Acute on chronic diastolic CHF: he is euvolemic after IV diuresis, which was transitioned to Lasix 40mg  daily. Net neg 13.8L. Weight down from 220--> 214 lbs. Renal function has been stable. Would discharge home on Lasix 40mg  daily.   AECOPD: treated with steroids and ABx. Recommend tobacco cessation.   Severe AS: echo this admission showed progression to severe AS. He is now s/p successful TAVR with a 29 mm Edwards Sapien 3 THV via the TF approach on 06/07/21. Post operative echo pending formal but personally reviewed and looks stable with a mean gradient ~17 and PVL. Groin sites are stable. ECG with sinus and old IVCD. There is no evidence of high grade heart block. Continue Asprin alone ( was previously on Plavix).   Moderate MR: hopeful this will improve s/p TAVR. Looks trivial to mild on echo today, will await formal read.   CAD: pre TAVR cath showed stable non obstructive CAD. Continued medical therapy.   Renal lesion: pre TAVR CT showed a hypodense exophytic 1.6 cm anterior interpolar left  renal cortical lesion, indeterminate for renal neoplasm, mildly increased from 1.2 cm on 10/25/2018 MRI, not well evaluated on prior MRI due to artifact. Suggest attention on follow-up renal mass protocol CT abdomen without and with IV contrast in 3-6 months. This will be discussed in the outpatient setting.   Dispo: okay to discharge home as long as he can ambulate okay. Post hospital f/u has been arranged with structural heart next week.   SignedAngelena Form, PA-C  06/08/2021, 8:42 AM  Pager [phone]  I have personally seen and examined this patient. I agree with the assessment and plan as outlined above.  He is doing well this am post TAVR. Echo personally reviewed. Mean gradient 17 mmHg. No significant PVL. BP stable. He is euvolemic on exam. Will need to continue Lasix 40 mg daily. Telemetry without evidence of heart block.  Continue ASA alone. No need to restart Plavix OK to discharge home today. We will plan follow up in the structural heart clinic next week.   Lauree Chandler 06/08/2021 9:26 AM

## 2021-06-08 NOTE — Progress Notes (Signed)
Mobility Specialist: Progress Note   06/08/21 1527  Mobility  Activity Ambulated in hall  Level of Assistance Standby assist, set-up cues, supervision of patient - no hands on  Assistive Device Front wheel walker  Distance Ambulated (ft) 160 ft  Mobility Ambulated with assistance in hallway  Mobility Response Tolerated well  Mobility performed by Mobility specialist  Bed Position Chair  $Mobility charge 1 Mobility   Pre-Mobility: 72 HR, 96% SpO2 Post-Mobility: 83 HR, 97% SpO2  Pt ambulated on 3 L./min [place], no c/o throughout. Pt back to recliner after walk with call bell and phone in reach.   [facility] Kyle Lewis Mobility Specialist Mobility Specialist 4 [place]: [phone] Mobility Specialist 2 Progress and [place]: [phone]

## 2021-06-08 NOTE — Progress Notes (Signed)
CARDIAC REHAB PHASE I   Offered to walk with pt. Pt states ambulation with PT. Feels some stronger, breathing a little easier than prior to procedure. Home O2 and HH getting set-up. Pt denies further questions or concerns at this time. Will continue to follow.  [phone] Rufina Falco, RN BSN 06/08/2021 1:27 PM

## 2021-06-08 NOTE — Progress Notes (Signed)
Physical Therapy Treatment Patient Details Name: Kyle Lewis MRN: [id number] DOB: Mar 17, 1946 Today's Date: 06/08/2021   History of Present Illness 76 y.o. male admitted 05/27/21 with worsening SOB and hypoxia. Workup revealed severe AS; now undergoing TAVR workup. Course complicated by hypercardbic respiratory failure. PMH includes CAD, AAA s/p repair, CKD 3, AS, OSA on CPAP, COPD, HTN, CVA.    PT Comments    Pt apprehensive about discharge home, given wife is unable to provide any physical assist at discharge and needs pt to be able to take care of bodily functions. To date pt has been utilizing a Tourist information centre manager for urination. Advised RN to remove and have pt utilize urinal today. Pt has poor understanding of need for supplemental O2, despite education from multiple disciplines. Pt is supervision for bed mobility, and transfers and min A for ambulation for management of lines and leads. Working with Risk analyst and [place] with line management he should be able to improve his safety.    Recommendations for follow up therapy are one component of a multi-disciplinary discharge planning process, led by the attending physician.  Recommendations may be updated based on patient status, additional functional criteria and insurance authorization.  Follow Up Recommendations  Home health PT     Assistance Recommended at Discharge Intermittent Supervision/Assistance     Equipment Recommendations  None recommended by PT       Precautions / Restrictions Precautions Precautions: Fall Precaution Comments: Watch SpO2 (not on home O2) Restrictions Weight Bearing Restrictions: No     Mobility  Bed Mobility Overal bed mobility: Needs Assistance Bed Mobility: Supine to Sit     Supine to sit: Supervision     General bed mobility comments: pt in sidelying on entry for wound assessment on buttock, foam pads applied    Transfers Overall transfer level: Needs assistance Equipment used:  Rolling walker (2 wheels) Transfers: Sit to/from Stand;Bed to chair/wheelchair/BSC Sit to Stand: Supervision     Step pivot transfers: Supervision     General transfer comment: cues for hand placement for safe power up, requires increased inertia to powerup into standing    Ambulation/Gait Ambulation/Gait assistance: Min assist Gait Distance (Feet): 80 Feet Assistive device: Rolling walker (2 wheels) Gait Pattern/deviations: Step-through pattern;Decreased stride length;Trunk flexed Gait velocity: Decreased Gait velocity interpretation: <1.31 ft/sec, indicative of household ambulator Pre-gait activities: cuing for safety especially with lines and obstacles General Gait Details: has poor safety awareness for lines and leads, and rather than navigation around obstacles, pt tries to use RW to push through obstacles, requires constant cuing for management of lines and leads.          Balance Overall balance assessment: Needs assistance Sitting-balance support: Feet supported Sitting balance-Leahy Scale: Fair     Standing balance support: During functional activity;Single extremity supported Standing balance-Leahy Scale: Poor Standing balance comment: requires at least single UE support for balance.                            Cognition Arousal/Alertness: Awake/alert;Lethargic Behavior During Therapy: WFL for tasks assessed/performed Overall Cognitive Status: Impaired/Different from baseline Area of Impairment: Awareness;Problem solving;Safety/judgement;Following commands                       Following Commands: Follows multi-step commands with increased time Safety/Judgement: Decreased awareness of safety;Decreased awareness of deficits Awareness: Anticipatory Problem Solving: Slow processing;Requires verbal cues;Requires tactile cues General Comments: pt has poor awareness of  his need of supplemental O2 as he is assymptomatic with SpO2 dropping into low  80%O2.           General Comments General comments (skin integrity, edema, etc.): Pt with [place] only in one nare on entry, 3L O2 running, SpO2 96%O2, with removal of [place] SpO2 drops to 89%O2 able to recover to 91%O2 with cues for purse lip breathing, Requires 3L O2 to maintain SpO2 >90%O2 with ambulation. VSS      Pertinent Vitals/Pain Pain Assessment: No/denies pain     PT Goals (current goals can now be found in the care plan section) Acute Rehab PT Goals Patient Stated Goal: go home PT Goal Formulation: With patient Time For Goal Achievement: 06/15/21 Potential to Achieve Goals: Good Progress towards PT goals: Progressing toward goals    Frequency    Min 3X/week      PT Plan Current plan remains appropriate       AM-PAC PT "6 Clicks" Mobility   Outcome Measure  Help needed turning from your back to your side while in a flat bed without using bedrails?: A Little Help needed moving from lying on your back to sitting on the side of a flat bed without using bedrails?: A Little Help needed moving to and from a bed to a chair (including a wheelchair)?: A Little Help needed standing up from a chair using your arms (e.g., wheelchair or bedside chair)?: A Little Help needed to walk in hospital room?: A Little Help needed climbing 3-5 steps with a railing? : A Lot 6 Click Score: 17    End of Session Equipment Utilized During Treatment: Gait belt;Oxygen Activity Tolerance: Patient limited by fatigue Patient left: with call bell/phone within reach;in chair;with chair alarm set;with family/visitor present Nurse Communication: Mobility status PT Visit Diagnosis: Other abnormalities of gait and mobility (R26.89);Muscle weakness (generalized) (M62.81)     Time: 3817-7116 PT Time Calculation (min) (ACUTE ONLY): 33 min  Charges:  $Gait Training: 8-22 mins $Therapeutic Activity: 8-22 mins                    Shayde Gervacio B. Migdalia Dk PT, DPT Acute Rehabilitation Services Pager  [phone] Office [phone]    [place] 06/08/2021, 12:02 PM

## 2021-06-08 NOTE — Progress Notes (Signed)
SATURATION QUALIFICATIONS: (This note is used to comply with regulatory documentation for home oxygen)  Patient Saturations on Room Air at Rest = 91%  Patient Saturations on Room Air while Ambulating = 82%  Patient Saturations on 3 Liters of oxygen while Ambulating = 91%  Please briefly explain why patient needs home oxygen: Pt requires 3L O2 via [place] to maintain SpO2 >90%O2 with ambulation.  Selia Wareing B. Migdalia Dk PT, DPT Acute Rehabilitation Services Pager [phone] Office [phone]

## 2021-06-08 NOTE — Care Management Important Message (Signed)
Important Message  Patient Details  Name: Kyle Lewis MRN: [id number] Date of Birth: 1945/08/01   Medicare Important Message Given:  Yes     [name] 06/08/2021, 9:22 AM

## 2021-06-08 NOTE — Progress Notes (Signed)
PROGRESS NOTE    Kyle Lewis  ZDG:[id number] DOB: 05/15/1946 DOA: 05/27/2021 PCP: [name], MD    Chief Complaint  Patient presents with   Shortness of Breath    Brief Narrative:  76 year old white male with a history of COPD, OSA, hypertension, prior history of AAA repair presents to the hospital today with worsening shortness of breath.  Patient been treated about 7 to 10 days ago with prednisone, antibiotics for presumed COPD exacerbation.   Admitted to hospital with acute on chronic diastolic heart failure secondary to severe aortic stenosis. TAVR performed on 1/2.  Subjective:  He reports not feeling well enough to go home today He remains on o2 supplement,02 dropped to the low 80's with minimal exertion, there is no edema on exam,   he states he does not want to go home on oxygen,  Friends at bedside   Assessment & Plan:   Principal Problem:   Acute respiratory failure with hypoxia and hypercapnia (HCC) Active Problems:   Hypertension   OSA (obstructive sleep apnea)   History of right MCA stroke   Exogenous obesity   Tobacco use   Hyperlipidemia LDL goal <70   CAD (coronary artery disease)   Severe aortic stenosis   COPD GOLD II   Acute on chronic diastolic heart failure (HCC)   Mitral regurgitation   S/P TAVR (transcatheter aortic valve replacement)  Acute hypoxic respiratory failure, not on home O2 prior to admission -Hypoxia thought due to underlying COPD, aortic stenosis and diastolic CHF exacerbation, detail please see CT angio chest on 12/28 -He has improved, no chest pain, no lower extremity edema on exam ,no short of breath at rest, but O2 dropped to low 80s on room air -home O2 arranged   Acute on chronic diastolic CHF With lower extremity edema and bilateral pleural effusion initially Received IV Lasix, edema has resolved, short of breath is improved Cardiology recommend discharge home on Lasix 40 daily  CAD  S/p cath on 12/27  showed mild LAD stenosis, moderate dLCx disease, moderate RCA disease and moderate ostial RPDA to RPDA disease with confirmed severe AS  Denies chest pain On asa, discontinue plavix per cardiology recommendation, I do not see statin, will defer to cardiology  Severe aortic stenosis S/p TAVR on 1/3 by Dr Cyndia Bent  HTN Home meds benicar held since admission, currently bp low normal without benicar  AAA s/p repair 2016  carotid artery disease 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 from 3875,   COPD, was treated with steroid and antibiotics PTA, finished prednisone treatment on 12/27 currently no wheezing, no cough Smoking cessation education Outpatient follow up with pulmonology scheduled  Bph, continue home meds Proscar and Flomax  OSA, continue home BiPAP   Body mass index is 33.63 kg/m.Marland Kitchen      Skin Assessment:  I have examined the patients skin and I agree with the wound assessment as performed by the wound care RN as outlined below:  Pressure Injury 06/07/21 Buttocks Right Deep Tissue Pressure Injury - Purple or maroon localized area of discolored intact skin or blood-filled blister due to damage of underlying soft tissue from pressure and/or shear. left buttock area of broken skin w (Active)  06/07/21 1239  Location: Buttocks  Location Orientation: Right  Staging: Deep Tissue Pressure Injury - Purple or maroon localized area of discolored intact skin or blood-filled blister due to damage of underlying soft tissue from pressure and/or shear.  Wound Description (Comments): left buttock area of  broken skin with surrounding skin purple in color  Present on Admission: No (present on admission to 4E)    Unresulted Labs (From admission, onward)     Start     Ordered   06/08/21 0500  CBC  Daily,   R     Question:  Specimen collection method  Answer:  Lab=Lab collect   06/07/21 1230   06/08/21 3662  Basic metabolic panel  Daily,   R     Question:  Specimen  collection method  Answer:  Lab=Lab collect   06/07/21 1230   06/06/21 9476  Basic metabolic panel  Daily,   R     Question:  Specimen collection method  Answer:  Lab=Lab collect   06/06/21 2046              DVT prophylaxis: SCDs Start: 06/07/21 1230 Place and maintain sequential compression device Start: 06/06/21 1855 SCDs Start: 05/27/21 2310   Code Status:full Family Communication: wife over the phone, wife reports she has cancer and is on home o2 Disposition:   Status is: Inpatient  Dispo: The patient is from: home              Anticipated d/c is to: home with home health              Anticipated d/c date is: likely 1/5                 Consultants:  Cardiology Cardiothoracic surgery Dr. Cyndia Bent Pulmonology Critical care/pulmonology Wound care  Procedures:  Cardiac cath TAVR  Antimicrobials:   Anti-infectives (From admission, onward)    Start     Dose/Rate Route Frequency Ordered Stop   06/07/21 2000  vancomycin (VANCOCIN) IVPB 1000 mg/200 mL premix        1,000 mg 200 mL/hr over 60 Minutes Intravenous  Once 06/07/21 1230 06/07/21 2055   06/07/21 0400  vancomycin (VANCOREADY) IVPB 1500 mg/300 mL        1,500 mg 150 mL/hr over 120 Minutes Intravenous To Surgery 06/06/21 1903 06/07/21 0955   06/07/21 0400  vancomycin (VANCOREADY) IVPB 1500 mg/300 mL  Status:  Discontinued        1,500 mg 150 mL/hr over 120 Minutes Intravenous  Once 06/06/21 0914 06/06/21 0918   06/07/21 0400  vancomycin (VANCOREADY) IVPB 1500 mg/300 mL  Status:  Discontinued        1,500 mg 150 mL/hr over 120 Minutes Intravenous To Surgery 06/06/21 0918 06/06/21 1735   06/07/21 0400  vancomycin (VANCOCIN) IVPB 1000 mg/200 mL premix  Status:  Discontinued        1,000 mg 200 mL/hr over 60 Minutes Intravenous To Surgery 06/06/21 1735 06/07/21 1201           Objective: Vitals:   06/07/21 2320 06/08/21 0417 06/08/21 0745 06/08/21 0845  BP: 116/64 (!) 142/74 (!) 148/77   Pulse: 75 83  88 91  Resp: 19 10 13 20   Temp: 98 F (36.7 C) 97.6 F (36.4 C) 97.9 F (36.6 C)   TempSrc: Oral Axillary Axillary   SpO2: 95% 92% 94% 92%  Weight:  97.4 kg    Height:        Intake/Output Summary (Last 24 hours) at 06/08/2021 1034 Last data filed at 06/08/2021 0919 Gross per 24 hour  Intake 875.5 ml  Output 1650 ml  Net -774.5 ml   Filed Weights   06/06/21 0400 06/07/21 0443 06/08/21 0417  Weight: 97 kg 95.4 kg 97.4 kg  Examination:  General exam: frail, chronically ill appearing, alert, awake, communicative, hard of hearing, states he does not remember having a foam put on his back side , he denies pain Respiratory system:  diminished, no wheezing, no rales, no rhonchi. Respiratory effort normal. Cardiovascular system:  RRR.  Gastrointestinal system: Abdomen is nondistended, soft and nontender.  Normal bowel sounds heard. Central nervous system: Alert and oriented. No focal neurological deficits. Extremities:  no edema Skin: No rashes, lesions or ulcers Psychiatry: Judgement and insight appear normal. Mood & affect appropriate.     Data Reviewed: I have personally reviewed following labs and imaging studies  CBC: Recent Labs  Lab 06/06/21 2028 06/07/21 0807 06/07/21 1041 06/08/21 0504  WBC 6.6  --   --  7.1  HGB 14.8 14.6 14.6 13.6  HCT 44.9 43.0 43.0 41.0  MCV 94.3  --   --  95.6  PLT 147*  --   --  622    Basic Metabolic Panel: Recent Labs  Lab 06/03/21 0933 06/06/21 0133 06/06/21 2028 06/07/21 0341 06/07/21 0807 06/07/21 1041 06/08/21 0504  NA 138 137 136 137 137 138 137  K 4.4 4.4 4.6 4.1 4.4 4.3 4.3  CL 91* 94* 95* 95* 96* 96* 97*  CO2 39* 36* 33* 37*  --   --  32  GLUCOSE 115* 107* 118* 104* 113* 132* 116*  BUN 36* 23 24* 22 22 21 22   CREATININE 0.80 0.89 0.86 0.80 0.70 0.70 0.92  CALCIUM 8.6* 8.5* 8.7* 8.4*  --   --  8.4*  MG  --   --   --   --   --   --  2.1    GFR: Estimated Creatinine Clearance: 77.1 mL/min (by C-G formula based on  SCr of 0.92 mg/dL).  Liver Function Tests: No results for input(s): AST, ALT, ALKPHOS, BILITOT, PROT, ALBUMIN in the last 168 hours.  CBG: Recent Labs  Lab 06/06/21 2054 06/07/21 0533 06/07/21 1630 06/07/21 2106 06/08/21 0556  GLUCAP 135* 104* 146* 255* 120*     Recent Results (from the past 240 hour(s))  Surgical pcr screen     Status: None   Collection Time: 06/06/21 11:41 PM   Specimen: Nasal Mucosa; Nasal Swab  Result Value Ref Range Status   MRSA, PCR NEGATIVE NEGATIVE Final   Staphylococcus aureus NEGATIVE NEGATIVE Final    Comment: (NOTE) The Xpert SA Assay (FDA approved for NASAL specimens in patients 34 years of age and older), is one component of a comprehensive surveillance program. It is not intended to diagnose infection nor to guide or monitor treatment. Performed at [facility], [place] [address], [place], [place] [address]          Radiology Studies: DG Chest 2 View  Result Date: 06/06/2021 CLINICAL DATA:  Preoperative assessment. EXAM: CHEST - 2 VIEW COMPARISON:  05/27/2021. FINDINGS: The heart size and mediastinal contours are stable. Atherosclerotic calcification of the aorta is noted. Lung volumes are low and patchy airspace disease is present at the lung bases. There is mild blunting of the costophrenic angles bilaterally. No pneumothorax. No acute osseous abnormality. IMPRESSION: 1. Low lung volumes with patchy atelectasis or infiltrate at the lung bases. 2. Small bilateral pleural effusions. 3. Aortic atherosclerosis. Electronically Signed   By: Brett Fairy M.D.   On: 06/06/2021 20:45   ECHO INTRAOPERATIVE TEE  Result Date: 06/07/2021  *INTRAOPERATIVE TRANSESOPHAGEAL REPORT *  Patient Name:   Kyle Lewis Date of Exam: 06/07/2021 Medical  Rec #:  [id number]       Height:       67.0 in Accession #:    [id number]      Weight:       210.3 lb Date of Birth:  February 07, 1946      BSA:          2.07 m Patient Age:    23 years        BP:           135/72  mmHg Patient Gender: M               HR:           66 bpm. Exam Location:  Anesthesiology Transesophogeal exam was perform intraoperatively during surgical procedure. Patient was closely monitored under general anesthesia during the entirety of examination. Indications:     Aortic Stenosis i35.0 Sonographer:     Raquel Sarna Senior RDCS Performing Phys: [place] Diagnosing Phys: Jenkins Rouge MD PROCEDURE: Intraoperative Transesophogeal 48mm Edwards S3U TAVR Implanted. Complications: No known complications during this procedure. POST-OP IMPRESSIONS _ Left Ventricle: has normal systolic function. The cavity size was normal. _ Right Ventricle: The cavity was normal. _ Aortic Valve: There is no regurgitation. No regurgitation post repair. The gradient recorded across the prosthetic valve is within the expected range. _ Mitral Valve: There is no regurgitation. No regurgitation post repair. The gradient recorded across the prosthetic valve is within the expected range. _ Tricuspid Valve: There is no regurgitation. No regurgitation post repair. The gradient recorded across the prosthetic valve is within the expected range. _ Comments: Pre TAVR : tri leaflet calcified with severe AS mean gradient 42 peakl 65 mmHg AVA 0.4 cm2 no AR  Post TAVR: well positioned 29 mm Sapien 3 Ultra valve No PVL mean gradient 5 peak 10 mmhg AVA 2.6 cm2. PRE-OP FINDINGS  Left Ventricle: The left ventricle has normal systolic function, with an ejection fraction of 60-65%. The cavity size was normal. There is moderately increased left ventricular wall thickness. There is moderate left ventricular hypertrophy. Right Ventricle: The right ventricle has normal systolic function. The cavity was normal. There is no increase in right ventricular wall thickness. Left Atrium: Left atrial size was dilated. No left atrial/left atrial appendage thrombus was detected. Right Atrium: Right atrial size was normal in size. Interatrial Septum: No atrial level  shunt detected by color flow Doppler. Pericardium: Trivial pericardial effusion is present. The pericardial effusion is anterior to the right ventricle. Trivial pericardial effusion anterior to RV present prior to TAVR and stable with no changes post deployment. Mitral Valve: Thickened. Mitral valve regurgitation is mild by color flow Doppler. Tricuspid Valve: The tricuspid valve was normal in structure. Tricuspid valve regurgitation was not visualized by color flow Doppler. Aortic Valve: The aortic valve has been repaired/replaced Aortic valve regurgitation was not assessed by color flow Doppler. Pre TAVR : tri leaflet calcified with severe AS mean gradient 42 peakl 65 mmHg AVA 0.4 cm2 no AR Post TAVR: well positioned 29 mm Sapien 3 Ultra valve No PVL mean gradient 5 peak 10 mmhg AVA 2.6 cm2. Pulmonic Valve: The pulmonic valve was normal in structure, with normal. The gradient recorded across the pulmonic valve is within the expected range. Pulmonic valve regurgitation was not assessed by color flow Doppler. Venous: The inferior vena cava is normal in size with greater than 50% respiratory variability, suggesting right atrial pressure of 3 mmHg. +--------------+--------++  LEFT VENTRICLE            +--------------+--------++  PLAX 2D                   +--------------+--------++  LVOT diam:     2.30 cm    +--------------+--------++  LVOT Area:     4.15 cm   +--------------+--------++                            +--------------+--------++ +------------------+------------++  AORTIC VALVE                      +------------------+------------++  AV Area (Vmax):    2.98 cm       +------------------+------------++  AV Area (Vmean):   3.01 cm       +------------------+------------++  AV Area (VTI):     2.55 cm       +------------------+------------++  AV Vmax:           155.00 cm/s    +------------------+------------++  AV Vmean:          102.000 cm/s   +------------------+------------++  AV VTI:            0.298 m         +------------------+------------++  AV Peak Grad:      9.6 mmHg       +------------------+------------++  AV Mean Grad:      5.0 mmHg       +------------------+------------++  LVOT Vmax:         111.00 cm/s    +------------------+------------++  LVOT Vmean:        73.800 cm/s    +------------------+------------++  LVOT VTI:          0.183 m        +------------------+------------++  LVOT/AV VTI ratio: 0.61           +------------------+------------++ +-------------+--------++  MITRAL VALVE             +--------------+-------+ +-------------+--------++  SHUNTS                   MV Peak grad: 3.6 mmHg   +--------------+-------+ +-------------+--------++  Systemic VTI:  0.18 m    MV Vmax:      0.95 m/s   +--------------+-------+ +-------------+--------++  Systemic Diam: 2.30 cm                            +--------------+-------+  Jenkins Rouge MD Electronically signed by Jenkins Rouge MD Signature Date/Time: 06/07/2021/9:59:43 AM    Final    Structural Heart Procedure  Result Date: 06/07/2021 See surgical note for result.       Scheduled Meds:  aspirin EC  81 mg Oral Daily   feeding supplement  237 mL Oral BID BM   finasteride  5 mg Oral Daily   fluticasone furoate-vilanterol  1 puff Inhalation Daily   furosemide  40 mg Oral Daily   gabapentin  300 mg Oral BID   insulin aspart  0-5 Units Subcutaneous QHS   insulin aspart  0-9 Units Subcutaneous TID WC   ipratropium-albuterol  3 mL Nebulization BID   mirabegron ER  25 mg Oral Daily   sodium chloride flush  3 mL Intravenous Q12H   sodium chloride flush  3 mL Intravenous Q12H   tamsulosin  0.4 mg Oral QPC supper   Continuous Infusions:  sodium chloride     sodium chloride     nitroGLYCERIN       LOS:  12 days   Time spent: 46mins Greater than 50% of this time was spent in counseling, explanation of diagnosis, planning of further management, and coordination of care.   Voice Recognition Viviann Spare dictation system was used to create this  note, attempts have been made to correct errors. Please contact the author with questions and/or clarifications.   Florencia Reasons, MD PhD FACP [facility]  Available via Epic secure chat 7am-7pm for nonurgent issues Please page for urgent issues To page the attending provider between 7A-7P or the covering provider during after hours 7P-7A, please log into the web site [email or web address] and access using universal [place] password for that web site. If you do not have the password, please call the hospital operator.    06/08/2021, 10:34 AM

## 2021-06-09 DIAGNOSIS — I5033 Acute on chronic diastolic (congestive) heart failure: Secondary | ICD-10-CM | POA: Diagnosis not present

## 2021-06-09 DIAGNOSIS — J9601 Acute respiratory failure with hypoxia: Secondary | ICD-10-CM | POA: Diagnosis not present

## 2021-06-09 DIAGNOSIS — I340 Nonrheumatic mitral (valve) insufficiency: Secondary | ICD-10-CM | POA: Diagnosis not present

## 2021-06-09 DIAGNOSIS — Z952 Presence of prosthetic heart valve: Secondary | ICD-10-CM | POA: Diagnosis not present

## 2021-06-09 DIAGNOSIS — J9602 Acute respiratory failure with hypercapnia: Secondary | ICD-10-CM | POA: Diagnosis not present

## 2021-06-09 LAB — BASIC METABOLIC PANEL
Anion gap: 5 (ref 5–15)
BUN: 25 mg/dL — ABNORMAL HIGH (ref 8–23)
CO2: 31 mmol/L (ref 22–32)
Calcium: 8.4 mg/dL — ABNORMAL LOW (ref 8.9–10.3)
Chloride: 99 mmol/L (ref 98–111)
Creatinine, Ser: 0.8 mg/dL (ref 0.61–1.24)
GFR, Estimated: >60 mL/min (ref >60)
Glucose, Bld: 96 mg/dL (ref 70–99)
Potassium: 4.6 mmol/L (ref 3.5–5.1)
Sodium: 135 mmol/L (ref 135–145)

## 2021-06-09 LAB — GLUCOSE, CAPILLARY
Glucose-Capillary: 91 mg/dL (ref 70–99)
Glucose-Capillary: 304 mg/dL — ABNORMAL HIGH (ref 70–99)

## 2021-06-09 LAB — CBC
HCT: 40.5 % (ref 39.0–52.0)
Hemoglobin: 12.9 g/dL — ABNORMAL LOW (ref 13.0–17.0)
MCH: 30.7 pg (ref 26.0–34.0)
MCHC: 31.9 g/dL (ref 30.0–36.0)
MCV: 96.4 fL (ref 80.0–100.0)
Platelets: 144 10*3/uL — ABNORMAL LOW (ref 150–400)
RBC: 4.2 MIL/uL — ABNORMAL LOW (ref 4.22–5.81)
RDW: 13.8 % (ref 11.5–15.5)
WBC: 6.8 10*3/uL (ref 4.0–10.5)
nRBC: 0 % (ref 0.0–0.2)

## 2021-06-09 MED FILL — Potassium Chloride Inj 2 mEq/ML: INTRAVENOUS | Qty: 40 | Status: AC

## 2021-06-09 MED FILL — Heparin Sodium (Porcine) Inj 1000 Unit/ML: Qty: 1000 | Status: AC

## 2021-06-09 MED FILL — Magnesium Sulfate Inj 50%: INTRAMUSCULAR | Qty: 10 | Status: AC

## 2021-06-09 NOTE — Progress Notes (Signed)
Pt refuse cpap at this time, stated he would wear cannula. RT informed pt to call for RT if he changes his mind during the night

## 2021-06-09 NOTE — Progress Notes (Signed)
CARDIAC REHAB PHASE I   Offered to walk with pt. Pt declines at this time, resting after breakfast. Pt feeling stronger today, ready to go home. Pt educated on site care, restrictions and exercise guidelines with emphasis on safety. Will refer to CRP II [place]. HH and home O2 set-up with CM.  [id number] Rufina Falco, RN BSN 06/09/2021 9:16 AM

## 2021-06-09 NOTE — TOC Progression Note (Signed)
Transition of Care (TOC) - Progression Note  Marvetta Gibbons RN, BSN Transitions of Care Unit 4E- RN Case Manager See Treatment Team for direct phone #    Patient Details  Name: Kyle Lewis MRN: [id number] Date of Birth: 09-14-1945  Transition of Care [facility]) CM/SW Contact  Dahlia Client, Romeo Rabon, RN Phone Number: 06/09/2021, 2:32 PM  Clinical Narrative:    Pt was cleared for transition home today, however wife called this am to inform staff that she was going to ED to get checked out for possible kidney stones and was in a lot of pain.  Spoke with pt at bedside this afternoon to check on wife's status and at this time patient states she is still at [facility] ED getting checked out and it is unknown at this time if she will be admitted or not. Pt is asking about going to rehab as he does not have anyone else that can assist or be with him at home. Friend at the bedside confirms this- friend can assist taking pt home but can not stay with pt.  Have explained to pt at this point in time we do not have recommendations for STSNF by therapies, per MD will have PT/OT come back to re-eval pt in light of current situation and potential that pt may not have assistance or supervision at home.   Per pt he is interested in [facility] and [facility], but is open to be faxed out to other area SNFs if recommendations are updated. Discussed with pt we will follow for plan with wife to see if she is admitted vs released from ED to return home. If wife is released to return home then pt may still be able to return home with [facility] if he is able to do for himself most ADLs with limited assistance. If not or if wife is admitted then pt may need to go to [facility]. CSW to follow up for potential SNF need.   Both CM and CSW to follow for transition plan/needs- CM has contacted [facility] to let them now discharge is on hold for today.    Expected Discharge Plan: [place] [facility] Barriers to  Discharge: Other (must enter comment) (needs to work with therapy for safe discharge)  Expected Discharge Plan and Services Expected Discharge Plan: [place]   Discharge Planning Services: CM Consult Post Acute Care Choice: Durable Medical Equipment, Home Health Living arrangements for the past 2 months: Single Family Home Expected Discharge Date: 06/09/21               DME Arranged: Oxygen DME Agency: [name] Date DME Agency Contacted: 06/08/21 Time DME Agency Contacted: [id number] Representative spoke with at DME Agency: [facility] Arranged: PT, Social Work [facility] Agency: [facility] [place] Date [place]: 06/08/21 Time [place]: [id number] Representative spoke with at [place]: [place] ([place]) Interventions    Readmission Risk Interventions No flowsheet data found.

## 2021-06-09 NOTE — Progress Notes (Addendum)
[facility] VALVE TEAM  Patient Name: Kyle Lewis Date of Encounter: 06/09/2021  Admit date: 05/27/2021  Primary Care Provider: [name], MD [facility] Cardiologist: Jenean Lindau, MD / Dr. Angelena Form & Dr. Cyndia Bent (TAVR) [facility] Electrophysiologist:  None   Hospital Problem List     Principal Problem:   Acute respiratory failure with hypoxia and hypercapnia ([place]) Active Problems:   Hypertension   OSA (obstructive sleep apnea)   History of right MCA stroke   Exogenous obesity   Tobacco use   Hyperlipidemia LDL goal <70   CAD (coronary artery disease)   Severe aortic stenosis   COPD GOLD II   Acute on chronic diastolic heart failure (HCC)   Mitral regurgitation   S/P TAVR (transcatheter aortic valve replacement)     Subjective   Eating breakfast. No complaints.   Inpatient Medications    Scheduled Meds:  aspirin EC  81 mg Oral Daily   feeding supplement  237 mL Oral BID BM   finasteride  5 mg Oral Daily   fluticasone furoate-vilanterol  1 puff Inhalation Daily   furosemide  40 mg Oral Daily   gabapentin  300 mg Oral BID   insulin aspart  0-5 Units Subcutaneous QHS   insulin aspart  0-9 Units Subcutaneous TID WC   ipratropium-albuterol  3 mL Nebulization BID   mirabegron ER  25 mg Oral Daily   sodium chloride flush  3 mL Intravenous Q12H   sodium chloride flush  3 mL Intravenous Q12H   tamsulosin  0.4 mg Oral QPC supper   Continuous Infusions:  sodium chloride     sodium chloride     nitroGLYCERIN     PRN Meds: sodium chloride, sodium chloride, acetaminophen **OR** acetaminophen, morphine injection, ondansetron **OR** ondansetron (ZOFRAN) IV, oxyCODONE, sodium chloride flush, sodium chloride flush, traMADol   Vital Signs    Vitals:   06/08/21 2253 06/09/21 0324 06/09/21 0500 06/09/21 0751  BP: (!) 107/49 133/67  132/71  Pulse: 75 71 68 75  Resp: 17 16 16 18   Temp: 98 F (36.7 C) 97.7 F  (36.5 C)  97.9 F (36.6 C)  TempSrc: Oral Oral  Oral  SpO2: 100% 96% 95% 94%  Weight:   97.5 kg   Height:        Intake/Output Summary (Last 24 hours) at 06/09/2021 0802 Last data filed at 06/09/2021 0618 Gross per 24 hour  Intake 960 ml  Output 3750 ml  Net -2790 ml   Filed Weights   06/07/21 0443 06/08/21 0417 06/09/21 0500  Weight: 95.4 kg 97.4 kg 97.5 kg    Physical Exam     General: Well developed, well nourished, NAD  HEENT: OP clear, mucus membranes moist  SKIN: warm, dry. No rashes. Neuro: No focal deficits  Musculoskeletal: Muscle strength 5/5 all ext  Psychiatric: Mood and affect normal  Neck: No JVD Lungs:Scattered wheezes Cardiovascular: Regular rate and rhythm. No murmurs Abdomen:Soft. Bowel sounds present. Non-tender.  Extremities: No lower extremity edema.   Labs    CBC Recent Labs    06/08/21 0504 06/09/21 0331  WBC 7.1 6.8  HGB 13.6 12.9*  HCT 41.0 40.5  MCV 95.6 96.4  PLT 150 676*   Basic Metabolic Panel Recent Labs    06/08/21 0504 06/09/21 0331  NA 137 135  K 4.3 4.6  CL 97* 99  CO2 32 31  GLUCOSE 116* 96  BUN 22 25*  CREATININE 0.92  0.80  CALCIUM 8.4* 8.4*  MG 2.1  --    Liver Function Tests No results for input(s): AST, ALT, ALKPHOS, BILITOT, PROT, ALBUMIN in the last 72 hours. No results for input(s): LIPASE, AMYLASE in the last 72 hours. Cardiac Enzymes No results for input(s): CKTOTAL, CKMB, CKMBINDEX, TROPONINI in the last 72 hours. BNP Invalid input(s): POCBNP D-Dimer No results for input(s): DDIMER in the last 72 hours. Hemoglobin A1C Recent Labs    06/06/21 2028  HGBA1C 5.5   Fasting Lipid Panel No results for input(s): CHOL, HDL, LDLCALC, TRIG, CHOLHDL, LDLDIRECT in the last 72 hours. Thyroid Function Tests No results for input(s): TSH, T4TOTAL, T3FREE, THYROIDAB in the last 72 hours.  Invalid input(s): FREET3  Telemetry    sinus - Personally Reviewed  ECG    Sinus with IVCD and LAD.  - Personally  Reviewed  Radiology    ECHOCARDIOGRAM COMPLETE  Result Date: 06/08/2021    ECHOCARDIOGRAM REPORT   Patient Name:   Kyle Lewis Date of Exam: 06/08/2021 Medical Rec #:  [id number]       Height:       67.0 in Accession #:    [id number]      Weight:       214.7 lb Date of Birth:  [birth year]      BSA:          2.084 m Patient Age:    75 years        BP:           148/77 mmHg Patient Gender: M               HR:           92 bpm. Exam Location:  Inpatient Procedure: 2D Echo, Cardiac Doppler and Color Doppler Indications:    S/p TAVR  History:        Patient has prior history of Echocardiogram examinations. CAD,                 Stroke; Risk Factors:Sleep Apnea and Hypertension.                 Aortic Valve: 29 mm Edwards Ultra, stented (TAVR) valve is                 present in the aortic position.  Sonographer:    Jyl Heinz Referring Phys: [id number] [place]  1. The aortic valve has been repaired/replaced. There is a 29 mm Edwards Ultra, stented (TAVR) valve present in the aortic position. Echo findings are consistent with normal structure and function of the aortic valve prosthesis.     Mean gradient 28mmHg, DI 0.5. No paravalvular leak.  2. Left ventricular ejection fraction, by estimation, is 60 to 65%. The left ventricle has normal function. The left ventricle has no regional wall motion abnormalities. There is severe concentric left ventricular hypertrophy. Indeterminate diastolic filling due to E-A fusion.  3. Right ventricular systolic function is normal. The right ventricular size is normal. There is normal pulmonary artery systolic pressure. The estimated right ventricular systolic pressure is 95.1 mmHg.  4. Left atrial size was mildly dilated.  5. The mitral valve is degenerative. There is moderate mitral annular calcification. Trivial-to-mild mitral valve regurgitation.  6. The inferior vena cava is dilated in size with >50% respiratory variability, suggesting right atrial pressure  of 8 mmHg. Comparison(s): Compared to prior TTE on 05/28/21, the patient is now s/p TAVR with normal functioning prosthesis on doppler  interrogation. Mitral regurgitation now appears improved to trivial-to-mild (previously moderate). FINDINGS  Left Ventricle: Left ventricular ejection fraction, by estimation, is 60 to 65%. The left ventricle has normal function. The left ventricle has no regional wall motion abnormalities. The left ventricular internal cavity size was normal in size. There is  severe concentric left ventricular hypertrophy. Indeterminate diastolic filling due to E-A fusion. Right Ventricle: The right ventricular size is normal. No increase in right ventricular wall thickness. Right ventricular systolic function is normal. There is normal pulmonary artery systolic pressure. The tricuspid regurgitant velocity is 2.00 m/s, and  with an assumed right atrial pressure of 8 mmHg, the estimated right ventricular systolic pressure is 69.4 mmHg. Left Atrium: Left atrial size was mildly dilated. Right Atrium: Right atrial size was normal in size. Pericardium: There is no evidence of pericardial effusion. Mitral Valve: The mitral valve is degenerative in appearance. There is mild thickening of the mitral valve leaflet(s). There is mild calcification of the mitral valve leaflet(s). Moderate mitral annular calcification. Mild mitral valve regurgitation. Tricuspid Valve: The tricuspid valve is normal in structure. Tricuspid valve regurgitation is mild. Aortic Valve: The aortic valve has been repaired/replaced. Aortic valve regurgitation is not visualized. Aortic valve mean gradient measures 16.5 mmHg. Aortic valve peak gradient measures 28.5 mmHg. Aortic valve area, by VTI measures 1.46 cm. There is a  29 mm Edwards Ultra, stented (TAVR) valve present in the aortic position. Echo findings are consistent with normal structure and function of the aortic valve prosthesis. Pulmonic Valve: The pulmonic valve was  normal in structure. Pulmonic valve regurgitation is trivial. Aorta: The aortic root and ascending aorta are structurally normal, with no evidence of dilitation. Venous: The inferior vena cava is dilated in size with greater than 50% respiratory variability, suggesting right atrial pressure of 8 mmHg. IAS/Shunts: The atrial septum is grossly normal.  LEFT VENTRICLE PLAX 2D LVIDd:         5.90 cm      Diastology LVIDs:         3.50 cm      LV e' medial:  5.66 cm/s LV PW:         1.20 cm      LV e' lateral: 5.00 cm/s LV IVS:        1.30 cm LVOT diam:     2.00 cm LV SV:         69 LV SV Index:   33 LVOT Area:     3.14 cm  LV Volumes (MOD) LV vol d, MOD A2C: 153.0 ml LV vol d, MOD A4C: 176.0 ml LV vol s, MOD A2C: 59.8 ml LV vol s, MOD A4C: 66.7 ml LV SV MOD A2C:     93.2 ml LV SV MOD A4C:     176.0 ml LV SV MOD BP:      99.8 ml RIGHT VENTRICLE             IVC RV Basal diam:  3.90 cm     IVC diam: 2.30 cm RV Mid diam:    2.90 cm RV S prime:     20.00 cm/s TAPSE (M-mode): 2.8 cm LEFT ATRIUM             Index        RIGHT ATRIUM           Index LA diam:        4.20 cm 2.02 cm/m   RA Area:     21.20 cm LA  Vol [facility]):   78.7 ml 37.76 ml/m  RA Volume:   59.30 ml  28.45 ml/m LA Vol (A4C):   59.1 ml 28.36 ml/m LA Biplane Vol: 73.4 ml 35.22 ml/m  AORTIC VALVE AV Area (Vmax):    1.44 cm AV Area (Vmean):   1.44 cm AV Area (VTI):     1.46 cm AV Vmax:           267.00 cm/s AV Vmean:          194.000 cm/s AV VTI:            0.469 m AV Peak Grad:      28.5 mmHg AV Mean Grad:      16.5 mmHg LVOT Vmax:         122.00 cm/s LVOT Vmean:        89.200 cm/s LVOT VTI:          0.219 m LVOT/AV VTI ratio: 0.47  AORTA Ao Asc diam: 3.30 cm TRICUSPID VALVE TR Peak grad:   16.0 mmHg TR Vmax:        200.00 cm/s  SHUNTS Systemic VTI:  0.22 m Systemic Diam: 2.00 cm Kyle Kaufman MD Electronically signed by Kyle Kaufman MD Signature Date/Time: 06/08/2021/11:48:39 AM    Final    ECHO INTRAOPERATIVE TEE  Result Date: 06/07/2021   *INTRAOPERATIVE TRANSESOPHAGEAL REPORT *  Patient Name:   Kyle Lewis Date of Exam: 06/07/2021 Medical Rec #:  [id number]       Height:       67.0 in Accession #:    [id number]      Weight:       210.3 lb Date of Birth:  24-Jan-1946      BSA:          2.07 m Patient Age:    67 years        BP:           135/72 mmHg Patient Gender: M               HR:           66 bpm. Exam Location:  Anesthesiology Transesophogeal exam was perform intraoperatively during surgical procedure. Patient was closely monitored under general anesthesia during the entirety of examination. Indications:     Aortic Stenosis i35.0 Sonographer:     Raquel Sarna Senior RDCS Performing Phys: [place] Diagnosing Phys: Jenkins Rouge MD PROCEDURE: Intraoperative Transesophogeal 13mm Edwards S3U TAVR Implanted. Complications: No known complications during this procedure. POST-OP IMPRESSIONS _ Left Ventricle: has normal systolic function. The cavity size was normal. _ Right Ventricle: The cavity was normal. _ Aortic Valve: There is no regurgitation. No regurgitation post repair. The gradient recorded across the prosthetic valve is within the expected range. _ Mitral Valve: There is no regurgitation. No regurgitation post repair. The gradient recorded across the prosthetic valve is within the expected range. _ Tricuspid Valve: There is no regurgitation. No regurgitation post repair. The gradient recorded across the prosthetic valve is within the expected range. _ Comments: Pre TAVR : tri leaflet calcified with severe AS mean gradient 42 peakl 65 mmHg AVA 0.4 cm2 no AR  Post TAVR: well positioned 29 mm Sapien 3 Ultra valve No PVL mean gradient 5 peak 10 mmhg AVA 2.6 cm2. PRE-OP FINDINGS  Left Ventricle: The left ventricle has normal systolic function, with an ejection fraction of 60-65%. The cavity size was normal. There is moderately increased left ventricular wall thickness. There is moderate left  ventricular hypertrophy. Right Ventricle: The  right ventricle has normal systolic function. The cavity was normal. There is no increase in right ventricular wall thickness. Left Atrium: Left atrial size was dilated. No left atrial/left atrial appendage thrombus was detected. Right Atrium: Right atrial size was normal in size. Interatrial Septum: No atrial level shunt detected by color flow Doppler. Pericardium: Trivial pericardial effusion is present. The pericardial effusion is anterior to the right ventricle. Trivial pericardial effusion anterior to RV present prior to TAVR and stable with no changes post deployment. Mitral Valve: Thickened. Mitral valve regurgitation is mild by color flow Doppler. Tricuspid Valve: The tricuspid valve was normal in structure. Tricuspid valve regurgitation was not visualized by color flow Doppler. Aortic Valve: The aortic valve has been repaired/replaced Aortic valve regurgitation was not assessed by color flow Doppler. Pre TAVR : tri leaflet calcified with severe AS mean gradient 42 peakl 65 mmHg AVA 0.4 cm2 no AR Post TAVR: well positioned 29 mm Sapien 3 Ultra valve No PVL mean gradient 5 peak 10 mmhg AVA 2.6 cm2. Pulmonic Valve: The pulmonic valve was normal in structure, with normal. The gradient recorded across the pulmonic valve is within the expected range. Pulmonic valve regurgitation was not assessed by color flow Doppler. Venous: The inferior vena cava is normal in size with greater than 50% respiratory variability, suggesting right atrial pressure of 3 mmHg. +--------------+--------++  LEFT VENTRICLE            +--------------+--------++  PLAX 2D                   +--------------+--------++  LVOT diam:     2.30 cm    +--------------+--------++  LVOT Area:     4.15 cm   +--------------+--------++                            +--------------+--------++ +------------------+------------++  AORTIC VALVE                      +------------------+------------++  AV Area (Vmax):    2.98 cm        +------------------+------------++  AV Area (Vmean):   3.01 cm       +------------------+------------++  AV Area (VTI):     2.55 cm       +------------------+------------++  AV Vmax:           155.00 cm/s    +------------------+------------++  AV Vmean:          102.000 cm/s   +------------------+------------++  AV VTI:            0.298 m        +------------------+------------++  AV Peak Grad:      9.6 mmHg       +------------------+------------++  AV Mean Grad:      5.0 mmHg       +------------------+------------++  LVOT Vmax:         111.00 cm/s    +------------------+------------++  LVOT Vmean:        73.800 cm/s    +------------------+------------++  LVOT VTI:          0.183 m        +------------------+------------++  LVOT/AV VTI ratio: 0.61           +------------------+------------++ +-------------+--------++  MITRAL VALVE             +--------------+-------+ +-------------+--------++  SHUNTS  MV Peak grad: 3.6 mmHg   +--------------+-------+ +-------------+--------++  Systemic VTI:  0.18 m    MV Vmax:      0.95 m/s   +--------------+-------+ +-------------+--------++  Systemic Diam: 2.30 cm                            +--------------+-------+  Jenkins Rouge MD Electronically signed by Jenkins Rouge MD Signature Date/Time: 06/07/2021/9:59:43 AM    Final    Structural Heart Procedure  Result Date: 06/07/2021 See surgical note for result.   Cardiac Studies   Echocardiogram 05/28/21:   1. Left ventricular ejection fraction, by estimation, is approximately  55%. The left ventricle has normal function. Left ventricular endocardial  border not optimally defined to evaluate regional wall motion. There is  severe concentric left ventricular  hypertrophy. Left ventricular diastolic parameters are consistent with  Grade I diastolic dysfunction (impaired relaxation). Elevated left  ventricular end-diastolic pressure.   2. Right ventricular systolic function is normal. The right ventricular   size is normal. There is mildly elevated pulmonary artery systolic  pressure. The estimated right ventricular systolic pressure is 66.4 mmHg.   3. Left atrial size was mildly dilated.   4. Right atrial size was mildly dilated.   5. There is trivial pericardial effusion surrounding the apex.   6. The mitral valve is degenerative. Moderate mitral valve regurgitation  and very eccentric. The mean mitral valve gradient is 3.0 mmHg. Moderate  mitral annular calcification.   7. Tricuspid valve regurgitation is moderate.   8. The aortic valve is tricuspid. There is severe calcifcation of the  aortic valve. Aortic valve regurgitation is not visualized. Severe aortic  valve stenosis. Aortic valve mean gradient measures 56.5 mmHg.  Dimentionless index 0.19.   9. The inferior vena cava is dilated in size with <50% respiratory  variability, suggesting right atrial pressure of 15 mmHg.   Comparison(s): Prior images reviewed side by side. Aortic stenosis has  progressed and in severe range. Mitral regurgitation is very eccentric and  likely moderate.    R/LHC 05/31/21:     Prox LAD lesion is 25% stenosed.   Dist Cx lesion is 40% stenosed.   Ost 2nd Mrg to 2nd Mrg lesion is 70% stenosed.   Dist RCA lesion is 60% stenosed.   Ost RPDA to RPDA lesion is 40% stenosed.   The LAD is a large caliber vessel that courses to the apex. There is mild mid LAD stenosis The Circumflex is a large caliber vessel that gives off a small obtuse marginal branch then 2 moderate caliber obtuse marginal branches. The second obtuse marginal branch has a moderate stenosis, unchanged from last cath in 2019.  The RCA is a large dominant artery. The distal RCA has a moderate stenosis, unchanged from last cath in 2019.  Severe aortic stenosis (mean gradient 50.6 mmHg, 59 mmHg, AVA 0.93 cm2).    Recommendations: Will continue workup for TAVR. His right and left heart pressures are only mildly abnormal following inpatient  diuresis. Continue diuresis. The structural heart team will provide a formal consultation regarding his candidacy for TAVR.    CTA 06/01/21: IMPRESSION: 1. 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   2. Annular measurements support a 29 mm S3 TAVR (574 mm2).   3. No significant annular or subannular calcifications.   4. Shallow left main height concerning for increased risk for coronary obstruction (9.2 mm).   5. Optimal Fluoroscopic Angle for Delivery: RAO  3 CAU 15   6. Dilated pulmonary artery suggestive of pulmonary hypertension.   CT chest 06/01/21:   IMPRESSION: 1. Vascular findings and measurements pertinent to potential TAVR procedure, as detailed. 2. Diffuse thickening and coarse calcification of the aortic valve, compatible with reported aortic stenosis. 3. Borderline mild cardiomegaly. Three-vessel coronary atherosclerosis. 4. Small dependent bilateral pleural effusions. 5. Severe centrilobular emphysema with diffuse bronchial wall thickening and saber sheath trachea, suggesting COPD. 6. Hypodense exophytic 1.6 cm anterior interpolar left renal cortical lesion, indeterminate for renal neoplasm, mildly increased from 1.2 cm on 10/25/2018 MRI, not well evaluated on prior MRI due to artifact. Suggest attention on follow-up renal mass protocol CT abdomen without and with IV contrast in 3-6 months. 7. Cholelithiasis. 8. Marked diffuse colonic diverticulosis. 9. Stable left adrenal adenoma. 10. 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 11. Aortic Atherosclerosis (ICD10-I70.0) and Emphysema (ICD10-J43.9).   Orthopantogram 06/01/21:   FINDINGS: Patient motion blurs the central incisors. Dental fillings are present in the upper and lower molars bilaterally. No significant dental caries are present. No significant periapical lucencies  are present. The mandible is intact and located.   IMPRESSION: 1. Dental fillings in the upper and lower molars bilaterally. 2. No significant dental caries.   ____________________   TAVR OPERATIVE NOTE     Date of Procedure:                06/07/2021   Preoperative Diagnosis:      Severe Aortic Stenosis    Postoperative Diagnosis:    Same    Procedure:        Transcatheter Aortic Valve Replacement - Percutaneous Right Transfemoral Approach             Edwards Sapien 3 THV (size 29 mm, model # 9600TFX, serial # [id number])              Co-Surgeons:                        Gaye Pollack, MD and Lauree Chandler, MD     Anesthesiologist:                  Jenita Seashore, MD   Echocardiographer:              Edmonia James, MD   Pre-operative Echo Findings: Severe aortic stenosis Normal left ventricular systolic function   Post-operative Echo Findings: No paravalvular leak Normal left ventricular systolic function    __________________   Echo 06/08/21: IMPRESSIONS  1. The aortic valve has been repaired/replaced. There is a 29 mm Edwards  Ultra, stented (TAVR) valve present in the aortic position. Echo findings  are consistent with normal structure and function of the aortic valve  prosthesis.      Mean gradient 25mmHg, DI 0.5. No paravalvular leak.   2. Left ventricular ejection fraction, by estimation, is 60 to 65%. The  left ventricle has normal function. The left ventricle has no regional  wall motion abnormalities. There is severe concentric left ventricular  hypertrophy. Indeterminate diastolic  filling due to E-A fusion.   3. Right ventricular systolic function is normal. The right ventricular  size is normal. There is normal pulmonary artery systolic pressure. The  estimated right ventricular systolic pressure is 36.6 mmHg.   4. Left atrial size was mildly dilated.   5. The mitral valve is degenerative. There is moderate  mitral annular  calcification.  Trivial-to-mild mitral valve regurgitation.   6. The inferior vena cava is dilated in size with >50% respiratory  variability, suggesting right atrial pressure of 8 mmHg.   Comparison(s): Compared to prior TTE on 05/28/21, the patient is now s/p  TAVR with normal functioning prosthesis on doppler interrogation. Mitral  regurgitation now appears improved to trivial-to-mild (previously  moderate).   Patient Profile     ELIC VENCILL is a 76 y.o. 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 Structural heart was consulted for consideration of inpatient TAVR.  Assessment & Plan    Acute hypoxic respiratory failure: multifactorial due to underlying COPD, aortic stenosis, and diastolic heart failure. Now resolved.   Acute on chronic diastolic CHF: Euvolemic post diuresis. Renal function has been stable. Would discharge home on Lasix 40mg  daily.   AECOPD: treated with steroids and ABx. Recommend tobacco cessation.   Severe AS: echo this admission showed progression to severe AS. He is now s/p successful TAVR with a 29 mm Edwards Sapien 3 THV via the TF approach on 06/07/21. Post op echo showed EF 60%, normally functioning TAVR with a mean gradient of 16.5 mmHg and no PVL. His groins are stable. ECG with sinus and old IVCD. There is no evidence of high grade heart block. Continue Asprin alone ( was previously on Plavix).   Mitral regurgitation: now trivial s/p TAVR  CAD: pre TAVR cath showed stable non obstructive CAD. Continued medical therapy.   Renal lesion: pre TAVR CT showed a hypodense exophytic 1.6 cm anterior interpolar left renal cortical lesion, indeterminate for renal neoplasm, mildly increased  from 1.2 cm on 10/25/2018 MRI, not well evaluated on prior MRI due to artifact. Suggest attention on follow-up renal mass protocol CT abdomen without and with IV contrast in 3-6 months. This will be discussed in the outpatient setting.   Dispo: OK to discharge home today. Post hospital f/u has been arranged with structural heart next week.   Signed, Lauree Chandler, MD  06/09/2021, 8:02 AM

## 2021-06-09 NOTE — Progress Notes (Signed)
PROGRESS NOTE    Kyle Lewis  JQZ:[id number] DOB: 09-24-1945 DOA: 05/27/2021 PCP: [name], MD    Chief Complaint  Patient presents with   Shortness of Breath    Brief Narrative:  75 year old white male with a history of COPD, OSA, hypertension, prior history of AAA repair presents to the hospital today with worsening shortness of breath.  Patient been treated about 7 to 10 days ago with prednisone, antibiotics for presumed COPD exacerbation.   Admitted to hospital with acute on chronic diastolic heart failure secondary to severe aortic stenosis. TAVR performed on 1/2.  Subjective:  He reports not feeling well enough to go home today, he reports wife is sick seeking care in [place] ED, he does not have strong social support He is now agreeing to home oxygen He reports he does has impaired  memory   Friends at bedside   Assessment & Plan:   Principal Problem:   Acute respiratory failure with hypoxia and hypercapnia (HCC) Active Problems:   Hypertension   OSA (obstructive sleep apnea)   History of right MCA stroke   Exogenous obesity   Tobacco use   Hyperlipidemia LDL goal <70   CAD (coronary artery disease)   Severe aortic stenosis   COPD GOLD II   Acute on chronic diastolic heart failure (HCC)   Mitral regurgitation   S/P TAVR (transcatheter aortic valve replacement)  Acute hypoxic respiratory failure, not on home O2 prior to admission -Hypoxia thought due to underlying COPD, aortic stenosis and diastolic CHF exacerbation, detail please see CT angio chest on 12/28 -He has improved, no chest pain, no lower extremity edema on exam ,no short of breath at rest, but O2 dropped to low 80s on room air -home O2 arranged   Acute on chronic diastolic CHF With lower extremity edema and bilateral pleural effusion initially Received IV Lasix, edema has resolved, short of breath is improved Cardiology recommend discharge home on Lasix 40 daily  CAD  S/p cath  on 12/27 showed mild LAD stenosis, moderate dLCx disease, moderate RCA disease and moderate ostial RPDA to RPDA disease with confirmed severe AS  Denies chest pain On asa, discontinue plavix per cardiology recommendation, I do not see statin, will defer to cardiology  Severe aortic stenosis S/p TAVR on 1/3 by Dr Cyndia Bent  HTN Home meds benicar held since admission, currently bp low normal without benicar  AAA s/p repair 2016  carotid artery disease with known RICA occlusion and 62-26% LICA disease from 3335,   COPD, was treated with steroid and antibiotics PTA, finished prednisone treatment on 12/27 currently no wheezing, no cough Smoking cessation education Outpatient follow up with pulmonology scheduled  Bph, continue home meds Proscar and Flomax  OSA, continue home BiPAP   Body mass index is 33.66 kg/m..    FTT, with poor memory, weakness, new oxygen requirement, does not have strong social support, may need SNF placement  Skin Assessment:  I have examined the patients skin and I agree with the wound assessment as performed by the wound care RN as outlined below:  Pressure Injury 06/07/21 Buttocks Right Deep Tissue Pressure Injury - Purple or maroon localized area of discolored intact skin or blood-filled blister due to damage of underlying soft tissue from pressure and/or shear. left buttock area of broken skin w (Active)  06/07/21 1239  Location: Buttocks  Location Orientation: Right  Staging: Deep Tissue Pressure Injury - Purple or maroon localized area of discolored intact skin or blood-filled blister  due to damage of underlying soft tissue from pressure and/or shear.  Wound Description (Comments): left buttock area of broken skin with surrounding skin purple in color  Present on Admission: No (present on admission to 4E)    Unresulted Labs (From admission, onward)     Start     Ordered   06/08/21 0500  CBC  Daily,   R     Question:  Specimen collection method   Answer:  Lab=Lab collect   06/07/21 1230   06/08/21 2831  Basic metabolic panel  Daily,   R     Question:  Specimen collection method  Answer:  Lab=Lab collect   06/07/21 1230   06/06/21 5176  Basic metabolic panel  Daily,   R     Question:  Specimen collection method  Answer:  Lab=Lab collect   06/06/21 2046              DVT prophylaxis: SCDs Start: 06/07/21 1230 Place and maintain sequential compression device Start: 06/06/21 1855 SCDs Start: 05/27/21 2310   Code Status:full Family Communication: wife over the phone, wife reports she has cancer and is on home o2 Disposition:   Status is: Inpatient  Dispo: The patient is from: home              Anticipated d/c is to: home with home health vs snf              Anticipated d/c date is: pending safe discharge plan                Consultants:  Cardiology Cardiothoracic surgery Dr. Cyndia Bent Pulmonology Critical care/pulmonology Wound care  Procedures:  Cardiac cath TAVR  Antimicrobials:   Anti-infectives (From admission, onward)    Start     Dose/Rate Route Frequency Ordered Stop   06/07/21 2000  vancomycin (VANCOCIN) IVPB 1000 mg/200 mL premix        1,000 mg 200 mL/hr over 60 Minutes Intravenous  Once 06/07/21 1230 06/07/21 2055   06/07/21 0400  vancomycin (VANCOREADY) IVPB 1500 mg/300 mL        1,500 mg 150 mL/hr over 120 Minutes Intravenous To Surgery 06/06/21 1903 06/07/21 0955   06/07/21 0400  vancomycin (VANCOREADY) IVPB 1500 mg/300 mL  Status:  Discontinued        1,500 mg 150 mL/hr over 120 Minutes Intravenous  Once 06/06/21 0914 06/06/21 0918   06/07/21 0400  vancomycin (VANCOREADY) IVPB 1500 mg/300 mL  Status:  Discontinued        1,500 mg 150 mL/hr over 120 Minutes Intravenous To Surgery 06/06/21 0918 06/06/21 1735   06/07/21 0400  vancomycin (VANCOCIN) IVPB 1000 mg/200 mL premix  Status:  Discontinued        1,000 mg 200 mL/hr over 60 Minutes Intravenous To Surgery 06/06/21 1735 06/07/21 1201            Objective: Vitals:   06/09/21 0751 06/09/21 0828 06/09/21 1111 06/09/21 1618  BP: 132/71 132/71 137/78 (!) 142/69  Pulse: 75 80 93 79  Resp: 18 (!) 21 18 18   Temp: 97.9 F (36.6 C)   98.2 F (36.8 C)  TempSrc: Oral  Oral Oral  SpO2: 94% 95% 96% 100%  Weight:      Height:        Intake/Output Summary (Last 24 hours) at 06/09/2021 1746 Last data filed at 06/09/2021 1400 Gross per 24 hour  Intake 240 ml  Output 3650 ml  Net -3410 ml   [employer]  06/07/21 0443 06/08/21 0417 06/09/21 0500  Weight: 95.4 kg 97.4 kg 97.5 kg    Examination:  General exam: frail, chronically ill appearing, alert, awake, communicative, hard of hearing, aaox3, but does admit impaired short term memory Respiratory system:  diminished, no wheezing, no rales, no rhonchi. Respiratory effort normal. Cardiovascular system:  RRR.  Gastrointestinal system: Abdomen is nondistended, soft and nontender.  Normal bowel sounds heard. Central nervous system: Alert and orientedx3. No focal neurological deficits. Extremities:  no edema Skin: No rashes, lesions or ulcers Psychiatry: Judgement and insight appear normal. Mood & affect appropriate.     Data Reviewed: I have personally reviewed following labs and imaging studies  CBC: Recent Labs  Lab 06/06/21 2028 06/07/21 0807 06/07/21 1041 06/08/21 0504 06/09/21 0331  WBC 6.6  --   --  7.1 6.8  HGB 14.8 14.6 14.6 13.6 12.9*  HCT 44.9 43.0 43.0 41.0 40.5  MCV 94.3  --   --  95.6 96.4  PLT 147*  --   --  150 144*    Basic Metabolic Panel: Recent Labs  Lab 06/06/21 0133 06/06/21 2028 06/07/21 0341 06/07/21 0807 06/07/21 1041 06/08/21 0504 06/09/21 0331  NA 137 136 137 137 138 137 135  K 4.4 4.6 4.1 4.4 4.3 4.3 4.6  CL 94* 95* 95* 96* 96* 97* 99  CO2 36* 33* 37*  --   --  32 31  GLUCOSE 107* 118* 104* 113* 132* 116* 96  BUN 23 24* 22 22 21 22  25*  CREATININE 0.89 0.86 0.80 0.70 0.70 0.92 0.80  CALCIUM 8.5* 8.7* 8.4*  --   --   8.4* 8.4*  MG  --   --   --   --   --  2.1  --     GFR: Estimated Creatinine Clearance: 88.8 mL/min (by C-G formula based on SCr of 0.8 mg/dL).  Liver Function Tests: No results for input(s): AST, ALT, ALKPHOS, BILITOT, PROT, ALBUMIN in the last 168 hours.  CBG: Recent Labs  Lab 06/08/21 1131 06/08/21 1607 06/08/21 2111 06/09/21 0615 06/09/21 1107  GLUCAP 135* 124* 125* 91 304*     Recent Results (from the past 240 hour(s))  Surgical pcr screen     Status: None   Collection Time: 06/06/21 11:41 PM   Specimen: Nasal Mucosa; Nasal Swab  Result Value Ref Range Status   MRSA, PCR NEGATIVE NEGATIVE Final   Staphylococcus aureus NEGATIVE NEGATIVE Final    Comment: (NOTE) The Xpert SA Assay (FDA approved for NASAL specimens in patients 61 years of age and older), is one component of a comprehensive surveillance program. It is not intended to diagnose infection nor to guide or monitor treatment. Performed at [facility], [place] [address] [place], [place] [address]          Radiology Studies: ECHOCARDIOGRAM COMPLETE  Result Date: 06/08/2021    ECHOCARDIOGRAM REPORT   Patient Name:   ABUNDIO TEUSCHER Date of Exam: 06/08/2021 Medical Rec #:  [id number]       Height:       67.0 in Accession #:    [id number]      Weight:       214.7 lb Date of Birth:  1946/01/17      BSA:          2.084 m Patient Age:    62 years        BP:           148/77 mmHg Patient  Gender: M               HR:           92 bpm. Exam Location:  Inpatient Procedure: 2D Echo, Cardiac Doppler and Color Doppler Indications:    S/p TAVR  History:        Patient has prior history of Echocardiogram examinations. CAD,                 Stroke; Risk Factors:Sleep Apnea and Hypertension.                 Aortic Valve: 29 mm Edwards Ultra, stented (TAVR) valve is                 present in the aortic position.  Sonographer:    Jyl Heinz Referring Phys: [id number] [place]  1. The aortic valve has been  repaired/replaced. There is a 29 mm Edwards Ultra, stented (TAVR) valve present in the aortic position. Echo findings are consistent with normal structure and function of the aortic valve prosthesis.     Mean gradient 30mmHg, DI 0.5. No paravalvular leak.  2. Left ventricular ejection fraction, by estimation, is 60 to 65%. The left ventricle has normal function. The left ventricle has no regional wall motion abnormalities. There is severe concentric left ventricular hypertrophy. Indeterminate diastolic filling due to E-A fusion.  3. Right ventricular systolic function is normal. The right ventricular size is normal. There is normal pulmonary artery systolic pressure. The estimated right ventricular systolic pressure is 02.5 mmHg.  4. Left atrial size was mildly dilated.  5. The mitral valve is degenerative. There is moderate mitral annular calcification. Trivial-to-mild mitral valve regurgitation.  6. The inferior vena cava is dilated in size with >50% respiratory variability, suggesting right atrial pressure of 8 mmHg. Comparison(s): Compared to prior TTE on 05/28/21, the patient is now s/p TAVR with normal functioning prosthesis on doppler interrogation. Mitral regurgitation now appears improved to trivial-to-mild (previously moderate). FINDINGS  Left Ventricle: Left ventricular ejection fraction, by estimation, is 60 to 65%. The left ventricle has normal function. The left ventricle has no regional wall motion abnormalities. The left ventricular internal cavity size was normal in size. There is  severe concentric left ventricular hypertrophy. Indeterminate diastolic filling due to E-A fusion. Right Ventricle: The right ventricular size is normal. No increase in right ventricular wall thickness. Right ventricular systolic function is normal. There is normal pulmonary artery systolic pressure. The tricuspid regurgitant velocity is 2.00 m/s, and  with an assumed right atrial pressure of 8 mmHg, the estimated right  ventricular systolic pressure is 85.2 mmHg. Left Atrium: Left atrial size was mildly dilated. Right Atrium: Right atrial size was normal in size. Pericardium: There is no evidence of pericardial effusion. Mitral Valve: The mitral valve is degenerative in appearance. There is mild thickening of the mitral valve leaflet(s). There is mild calcification of the mitral valve leaflet(s). Moderate mitral annular calcification. Mild mitral valve regurgitation. Tricuspid Valve: The tricuspid valve is normal in structure. Tricuspid valve regurgitation is mild. Aortic Valve: The aortic valve has been repaired/replaced. Aortic valve regurgitation is not visualized. Aortic valve mean gradient measures 16.5 mmHg. Aortic valve peak gradient measures 28.5 mmHg. Aortic valve area, by VTI measures 1.46 cm. There is a  29 mm Edwards Ultra, stented (TAVR) valve present in the aortic position. Echo findings are consistent with normal structure and function of the aortic valve prosthesis. Pulmonic Valve: The pulmonic valve was normal  in structure. Pulmonic valve regurgitation is trivial. Aorta: The aortic root and ascending aorta are structurally normal, with no evidence of dilitation. Venous: The inferior vena cava is dilated in size with greater than 50% respiratory variability, suggesting right atrial pressure of 8 mmHg. IAS/Shunts: The atrial septum is grossly normal.  LEFT VENTRICLE PLAX 2D LVIDd:         5.90 cm      Diastology LVIDs:         3.50 cm      LV e' medial:  5.66 cm/s LV PW:         1.20 cm      LV e' lateral: 5.00 cm/s LV IVS:        1.30 cm LVOT diam:     2.00 cm LV SV:         69 LV SV Index:   33 LVOT Area:     3.14 cm  LV Volumes (MOD) LV vol d, MOD A2C: 153.0 ml LV vol d, MOD A4C: 176.0 ml LV vol s, MOD A2C: 59.8 ml LV vol s, MOD A4C: 66.7 ml LV SV MOD A2C:     93.2 ml LV SV MOD A4C:     176.0 ml LV SV MOD BP:      99.8 ml RIGHT VENTRICLE             IVC RV Basal diam:  3.90 cm     IVC diam: 2.30 cm RV Mid diam:     2.90 cm RV S prime:     20.00 cm/s TAPSE (M-mode): 2.8 cm LEFT ATRIUM             Index        RIGHT ATRIUM           Index LA diam:        4.20 cm 2.02 cm/m   RA Area:     21.20 cm LA Vol (A2C):   78.7 ml 37.76 ml/m  RA Volume:   59.30 ml  28.45 ml/m LA Vol (A4C):   59.1 ml 28.36 ml/m LA Biplane Vol: 73.4 ml 35.22 ml/m  AORTIC VALVE AV Area (Vmax):    1.44 cm AV Area (Vmean):   1.44 cm AV Area (VTI):     1.46 cm AV Vmax:           267.00 cm/s AV Vmean:          194.000 cm/s AV VTI:            0.469 m AV Peak Grad:      28.5 mmHg AV Mean Grad:      16.5 mmHg LVOT Vmax:         122.00 cm/s LVOT Vmean:        89.200 cm/s LVOT VTI:          0.219 m LVOT/AV VTI ratio: 0.47  AORTA Ao Asc diam: 3.30 cm TRICUSPID VALVE TR Peak grad:   16.0 mmHg TR Vmax:        200.00 cm/s  SHUNTS Systemic VTI:  0.22 m Systemic Diam: 2.00 cm Gwyndolyn Kaufman MD Electronically signed by Gwyndolyn Kaufman MD Signature Date/Time: 06/08/2021/11:48:39 AM    Final         Scheduled Meds:  aspirin EC  81 mg Oral Daily   feeding supplement  237 mL Oral BID BM   finasteride  5 mg Oral Daily   fluticasone furoate-vilanterol  1 puff Inhalation Daily   furosemide  40 mg Oral Daily   gabapentin  300 mg Oral BID   insulin aspart  0-5 Units Subcutaneous QHS   insulin aspart  0-9 Units Subcutaneous TID WC   ipratropium-albuterol  3 mL Nebulization BID   mirabegron ER  25 mg Oral Daily   sodium chloride flush  3 mL Intravenous Q12H   sodium chloride flush  3 mL Intravenous Q12H   tamsulosin  0.4 mg Oral QPC supper   Continuous Infusions:  sodium chloride     sodium chloride     nitroGLYCERIN       LOS: 13 days   Time spent: 57mins Greater than 50% of this time was spent in counseling, explanation of diagnosis, planning of further management, and coordination of care.   Voice Recognition Viviann Spare dictation system was used to create this note, attempts have been made to correct errors. Please contact the author with  questions and/or clarifications.   Florencia Reasons, MD PhD FACP [facility]  Available via Epic secure chat 7am-7pm for nonurgent issues Please page for urgent issues To page the attending provider between 7A-7P or the covering provider during after hours 7P-7A, please log into the web site [email or web address] and access using universal [place] password for that web site. If you do not have the password, please call the hospital operator.    06/09/2021, 5:46 PM

## 2021-06-10 ENCOUNTER — Other Ambulatory Visit (HOSPITAL_COMMUNITY): Payer: Self-pay

## 2021-06-10 DIAGNOSIS — J9601 Acute respiratory failure with hypoxia: Secondary | ICD-10-CM | POA: Diagnosis not present

## 2021-06-10 DIAGNOSIS — J9602 Acute respiratory failure with hypercapnia: Secondary | ICD-10-CM | POA: Diagnosis not present

## 2021-06-10 LAB — BASIC METABOLIC PANEL
Anion gap: 6 (ref 5–15)
BUN: 19 mg/dL (ref 8–23)
CO2: 35 mmol/L — ABNORMAL HIGH (ref 22–32)
Calcium: 8.6 mg/dL — ABNORMAL LOW (ref 8.9–10.3)
Chloride: 97 mmol/L — ABNORMAL LOW (ref 98–111)
Creatinine, Ser: 0.72 mg/dL (ref 0.61–1.24)
GFR, Estimated: >60 mL/min (ref >60)
Glucose, Bld: 95 mg/dL (ref 70–99)
Potassium: 4.3 mmol/L (ref 3.5–5.1)
Sodium: 138 mmol/L (ref 135–145)

## 2021-06-10 LAB — GLUCOSE, CAPILLARY
Glucose-Capillary: 98 mg/dL (ref 70–99)
Glucose-Capillary: 103 mg/dL — ABNORMAL HIGH (ref 70–99)
Glucose-Capillary: 97 mg/dL (ref 70–99)
Glucose-Capillary: 154 mg/dL — ABNORMAL HIGH (ref 70–99)

## 2021-06-10 LAB — CBC
HCT: 40.6 % (ref 39.0–52.0)
Hemoglobin: 13.1 g/dL (ref 13.0–17.0)
MCH: 31 pg (ref 26.0–34.0)
MCHC: 32.3 g/dL (ref 30.0–36.0)
MCV: 96 fL (ref 80.0–100.0)
Platelets: 151 10*3/uL (ref 150–400)
RBC: 4.23 MIL/uL (ref 4.22–5.81)
RDW: 13.7 % (ref 11.5–15.5)
WBC: 6.2 10*3/uL (ref 4.0–10.5)
nRBC: 0 % (ref 0.0–0.2)

## 2021-06-10 MED ORDER — ATORVASTATIN CALCIUM 40 MG PO TABS
40.0000 mg | ORAL_TABLET | Freq: Every day | ORAL | 0 refills | Status: DC
  Filled 2021-06-10: qty 30, 30d supply, fill #0

## 2021-06-10 MED ORDER — ATORVASTATIN CALCIUM 40 MG PO TABS
40.0000 mg | ORAL_TABLET | Freq: Every day | ORAL | Status: DC
  Administered 2021-06-10: 40 mg via ORAL
  Filled 2021-06-10: qty 1

## 2021-06-10 MED ORDER — OLMESARTAN MEDOXOMIL 40 MG PO TABS: 40.0000 mg | ORAL_TABLET | Freq: Every day | ORAL | 1 refills | Status: DC

## 2021-06-10 MED ORDER — ASPIRIN 81 MG PO TBEC
81.0000 mg | DELAYED_RELEASE_TABLET | Freq: Every day | ORAL | 11 refills | Status: DC
  Filled 2021-06-10: qty 30, 30d supply, fill #0

## 2021-06-10 MED ORDER — METOPROLOL TARTRATE 12.5 MG HALF TABLET
12.5000 mg | ORAL_TABLET | Freq: Two times a day (BID) | ORAL | Status: DC
  Administered 2021-06-10: 12.5 mg via ORAL
  Filled 2021-06-10: qty 1

## 2021-06-10 MED ORDER — FUROSEMIDE 40 MG PO TABS
40.0000 mg | ORAL_TABLET | Freq: Every day | ORAL | 0 refills | Status: DC
  Filled 2021-06-10: qty 30, 30d supply, fill #0

## 2021-06-10 MED ORDER — FLUTICASONE FUROATE-VILANTEROL 200-25 MCG/ACT IN AEPB
1.0000 | INHALATION_SPRAY | Freq: Every day | RESPIRATORY_TRACT | 0 refills | Status: DC
  Filled 2021-06-10: qty 60, 30d supply, fill #0

## 2021-06-10 MED ORDER — ALBUTEROL SULFATE HFA 108 (90 BASE) MCG/ACT IN AERS
2.0000 | INHALATION_SPRAY | Freq: Four times a day (QID) | RESPIRATORY_TRACT | 2 refills | Status: DC | PRN
  Filled 2021-06-10: qty 18, 25d supply, fill #0

## 2021-06-10 MED ORDER — METOPROLOL TARTRATE 25 MG PO TABS
12.5000 mg | ORAL_TABLET | Freq: Two times a day (BID) | ORAL | 0 refills | Status: DC
  Filled 2021-06-10: qty 60, 60d supply, fill #0

## 2021-06-10 NOTE — Progress Notes (Signed)
Order received to discharge patient.  Telemetry monitor removed and CCMD notified.  PIV access removed.  Discharge instructions, follow up, medications and instructions for their use discussed with patient. 

## 2021-06-10 NOTE — Progress Notes (Signed)
Physical Therapy Treatment Patient Details Name: Kyle Lewis MRN: [id number] DOB: 05-26-1946 Today's Date: 06/10/2021   History of Present Illness Pt is a 76 y.o. male admitted 05/27/21 with worsening SOB and hypoxia. Workup revealed severe AS. Course complicated by hypercarbic respiratory failure. S/p TAVR 1/3. PMH includes CAD, AAA s/p repair, CKD 3, AS, OSA on CPAP, COPD, HTN, CVA.   PT Comments    Pt progressing with mobility. Today's session focused on improving activity tolerance, education in preparation for d/c home today. Pt received phone call from wife during session, who reports she will d/c home today too. Pt mobilizing with RW and supervision for safety/lines. Educ on O2 management, but pt will likely require repeat education and assistance with this. Do not feel pt requires SNF-level therapies; pt planning for home, hopeful for d/c today. If to remain admitted, will continue to follow acutely.    Recommendations for follow up therapy are one component of a multi-disciplinary discharge planning process, led by the attending physician.  Recommendations may be updated based on patient status, additional functional criteria and insurance authorization.  Follow Up Recommendations  Home health PT     Assistance Recommended at Discharge Intermittent Supervision/Assistance  Patient can return home with the following Assistance with cooking/housework;Assistance for O2 set-up/management   Equipment Recommendations  None recommended by PT    Recommendations for Other Services       Precautions / Restrictions Precautions Precautions: Fall;Other (comment) Precaution Comments: Watch SpO2 (not on home O2) Restrictions Weight Bearing Restrictions: No     Mobility  Bed Mobility Overal bed mobility: Independent                  Transfers Overall transfer level: Needs assistance Equipment used: None;Rolling walker (2 wheels) Transfers: Sit to/from Stand Sit to Stand:  Supervision           General transfer comment: Initial stand without DME, pt reaching out far to furniture for UE support; pt required cues to recognize need for DME; supervision for safety    Ambulation/Gait Ambulation/Gait assistance: Min guard;Supervision Gait Distance (Feet): 100 Feet Assistive device: Rolling walker (2 wheels) Gait Pattern/deviations: Step-through pattern;Decreased stride length;Trunk flexed Gait velocity: Decreased     General Gait Details: Requires cues on need for DME (RW) for stability with ambulation; ambulatory with RW and supervision for safety, assist for lines/O2 management; DOE 2-3/4   Stairs             Wheelchair Mobility    Modified Rankin (Stroke Patients Only)       Balance Overall balance assessment: Needs assistance Sitting-balance support: Feet supported Sitting balance-Leahy Scale: Good     Standing balance support: During functional activity;Single extremity supported;No upper extremity supported Standing balance-Leahy Scale: Fair Standing balance comment: can static stand without UE support, preference for single-double UE support for static and dynamic balance                            Cognition Arousal/Alertness: Awake/alert Behavior During Therapy: WFL for tasks assessed/performed Overall Cognitive Status: No family/caregiver present to determine baseline cognitive functioning Area of Impairment: Problem solving;Safety/judgement                         Safety/Judgement: Decreased awareness of safety   Problem Solving: Requires verbal cues          Exercises      General Comments General comments (  skin integrity, edema, etc.): Session performed on 3L O2 [place]; educ pt on O2 tank management, turning on to mobilize with tank (questionable teachback). Pt's friend Fritz Pickerel) present at end of session, reports able to provide transportation home. Wife called pt at beginning of session, reports she  will be d/c home today as well. Pt owns necessary DME (RW, raised toilet seat, shower seat), agreeable to HHPT. Repeated educ on initial use of RW, as opposed to [facility] which pt typically uses PTA      Pertinent Vitals/Pain Pain Assessment: No/denies pain    Home Living                          Prior Function            PT Goals (current goals can now be found in the care plan section) Progress towards PT goals: Progressing toward goals    Frequency    Min 3X/week      PT Plan Current plan remains appropriate    Co-evaluation              AM-PAC PT "6 Clicks" Mobility   Outcome Measure  Help needed turning from your back to your side while in a flat bed without using bedrails?: None Help needed moving from lying on your back to sitting on the side of a flat bed without using bedrails?: None Help needed moving to and from a bed to a chair (including a wheelchair)?: A Little Help needed standing up from a chair using your arms (e.g., wheelchair or bedside chair)?: A Little Help needed to walk in hospital room?: A Little Help needed climbing 3-5 steps with a railing? : A Little 6 Click Score: 20    End of Session Equipment Utilized During Treatment: Gait belt;Oxygen Activity Tolerance: Patient tolerated treatment well Patient left: in chair;with call bell/phone within reach;with family/visitor present Nurse Communication: Mobility status PT Visit Diagnosis: Other abnormalities of gait and mobility (R26.89);Muscle weakness (generalized) (M62.81)     Time: 0017-4944 PT Time Calculation (min) (ACUTE ONLY): 29 min  Charges:  $Therapeutic Exercise: 8-22 mins $Self Care/Home Management: National, PT, DPT Acute Rehabilitation Services  Pager [phone] Office [phone]  Derry Lory 06/10/2021, 9:44 AM

## 2021-06-10 NOTE — Progress Notes (Signed)
CARDIAC REHAB PHASE I   Went to offer to walk with pt. Pt ambulated with PT, feels good today. Pts wife to d/c from hospital today, and ride at bedside. Home O2 at bedside. Encouraged continued ambulation with emphasis on safety. Referred to [facility] [employer].  [id number] Rufina Falco, RN BSN 06/10/2021 9:54 AM

## 2021-06-10 NOTE — Care Management Important Message (Signed)
Important Message  Patient Details  Name: Kyle Lewis MRN: [id number] Date of Birth: [birth year]   Medicare Important Message Given:  Yes     [name] 06/10/2021, 11:07 AM

## 2021-06-10 NOTE — Discharge Summary (Signed)
Discharge Summary  ANTWAIN CALIENDO WIO:[id number] DOB: 04/30/1946  PCP: [name], MD  Admit date: 05/27/2021 Discharge date: 06/10/2021  Time spent: 65mins, more than 50% time spent on coordination of care.  Recommendations for Outpatient Follow-up:  F/u with PCP within a week  for hospital discharge follow up, repeat cbc/bmp at follow up F/u with cardiology F/u with pulmonology Home health/home o2    Discharge Diagnoses:  Active Hospital Problems   Diagnosis Date Noted   Acute respiratory failure with hypoxia and hypercapnia (HCC) 05/27/2021   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   Mitral regurgitation 06/02/2021   Acute on chronic diastolic heart failure (HCC)    COPD GOLD II 04/07/2019   Severe aortic stenosis 02/01/2018   CAD (coronary artery disease) 02/01/2018   Hyperlipidemia LDL goal <70 01/19/2018   History of right MCA stroke 07/21/2014   Tobacco use 11/08/2013   Exogenous obesity 11/08/2013   OSA (obstructive sleep apnea)    Hypertension     Resolved Hospital Problems   Diagnosis Date Noted Date Resolved   Acute CHF (congestive heart failure) ([place]) 05/27/2021 06/07/2021   S/P TAVR (transcatheter aortic valve replacement) 06/07/2020 06/07/2021    Discharge Condition: stable  Diet recommendation: heart healthy  Filed Weights   06/08/21 0417 06/09/21 0500 06/10/21 0650  Weight: 97.4 kg 97.5 kg 97.5 kg    History of present illness: ( per admitting MD Dr Bridgett Larsson) History of present illness: 76 year old white male with a history of COPD, OSA, hypertension, prior history of AAA repair presents to the hospital today with worsening shortness of breath.  Patient been treated about 7 to 10 days ago with prednisone, antibiotics for presumed COPD exacerbation.  Patient 's wife called his PCP today due to patient having hypoxia at home with reported room air sats of 58%.  911 was activated.   On arrival sats were 94% on 4 L.  Blood  pressure 175/95.   Patient denies any recent fever or chills.  He has been short of breath for 7 to 10 days.  He has had an occasional cough.  Patient states that his wife noticed ankle swelling the last 2 to 3 days.  He does not weigh himself.  He has no prior history of congestive heart failure.   Lab evaluation BNP was elevated to 1348.  No prior other BNP's are available in EMR.  COVID-negative.  Flu negative.   Serum CO2 34, BUN 23, creatinine 0.7   CBC white count 7.7, hemoglobin 16.6, platelets 186.   Chest x-ray demonstrated mild cardiomegaly.  Some trace effusions.  No frank edema.   [facility] hospitalist contacted for admission.    ED Course: BNP elevated. CXR without pulmonary edema. O2 sats 90% on 4 L  Hospital Course:  Principal Problem:   Acute respiratory failure with hypoxia and hypercapnia (HCC) Active Problems:   Hypertension   OSA (obstructive sleep apnea)   History of right MCA stroke   Exogenous obesity   Tobacco use   Hyperlipidemia LDL goal <70   CAD (coronary artery disease)   Severe aortic stenosis   COPD GOLD II   Acute on chronic diastolic heart failure (HCC)   Mitral regurgitation   S/P TAVR (transcatheter aortic valve replacement)   Acute hypoxic respiratory failure, not on home O2 prior to admission -Hypoxia thought due to underlying COPD, aortic stenosis and diastolic CHF exacerbation, detail please see CT angio chest on 12/28 -He has improved, no chest pain, no lower  extremity edema on exam ,no short of breath at rest, but O2 dropped to low 80s on room air -home O2 arranged    Acute on chronic diastolic CHF With lower extremity edema and bilateral pleural effusion initially Received IV Lasix, edema has resolved, short of breath is improved Cardiology recommend discharge home on Lasix 40 daily, he will f/u with cardiology next weel   CAD  S/p cath on 12/27 showed mild LAD stenosis, moderate dLCx disease, moderate RCA disease and moderate ostial  RPDA to RPDA disease with confirmed severe AS  Denies chest pain On asa, discontinue plavix per cardiology recommendation,  Discharged on statin   defer to cardiology   Severe aortic stenosis S/p TAVR on 1/3 by Dr Cyndia Bent   HTN On lasix only in the hospital, bp start to trend up, discharged on low dose lopressor F/u with cardiology next week   AAA s/p repair 2016   carotid artery disease with known 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      FTT, with poor memory, weakness, new oxygen requirement, patient decide to go home with home health   Skin Assessment:   I have examined the patients skin and I agree with the wound assessment as performed by the wound care RN as outlined below:   Pressure Injury 06/07/21 Buttocks Right Deep Tissue Pressure Injury - Purple or maroon localized area of discolored intact skin or blood-filled blister due to damage of underlying soft tissue from pressure and/or shear. left buttock area of broken skin w (Active)  06/07/21 1239  Location: Buttocks  Location Orientation: Right  Staging: Deep Tissue Pressure Injury - Purple or maroon localized area of discolored intact skin or blood-filled blister due to damage of underlying soft tissue from pressure and/or shear.  Wound Description (Comments): left buttock area of broken skin with surrounding skin purple in color  Present on Admission: No (present on admission to 4E)      Discharge Exam: BP (!) 162/90    Pulse 92    Temp 98.1 F (36.7 C) (Oral)    Resp 20    Ht 5\' 7"  (1.702 m)     Wt 97.5 kg    SpO2 95%    BMI 33.67 kg/m   General: NAD, AAOx3, hard of hearing Cardiovascular: RRR, no edema Respiratory: CTABL  Discharge Instructions You were cared for by a hospitalist during your hospital stay. If you have any questions about your discharge medications or the care you received while you were in the hospital after you are discharged, you can call the unit and asked to speak with the hospitalist on call if the hospitalist that took care of you is not available. Once you are discharged, your primary care physician will handle any further medical issues. Please note that NO REFILLS for any discharge medications will be authorized once you are discharged, as it is imperative that you return to your primary care physician (or establish a relationship with a primary care physician if you do not have one) for your aftercare needs so that they can  reassess your need for medications and monitor your lab values.  Discharge Instructions     Amb Referral to Cardiac Rehabilitation   Complete by: As directed    Will send Cardiac Rehab Phase 2 referral to [facility]   Diagnosis: Valve Replacement   Valve: Aortic Comment - TAVR   After initial evaluation and assessments completed: Virtual Based Care may be provided alone or in conjunction with Phase 2 Cardiac Rehab based on patient barriers.: Yes   Diet - low sodium heart healthy   Complete by: As directed    Discharge instructions   Complete by: As directed    Please weight yourself daily, please call your doctor if your weight go up 3lbs in three days or increased swelling or sob   Discharge wound care:   Complete by: As directed    Foal dressing to open areas on buttocks, change q3days or as needed if soiling Pressure off loading F/u with pcp to would healing   Discharge wound care:   Complete by: As directed    Pressure offloading   Discharge wound care:   Complete by: As directed    Pressure offloading, Foam dressing to open  area on buttocks, change every 3 days or as needed for soiling   Increase activity slowly   Complete by: As directed    Increase activity slowly   Complete by: As directed    Increase activity slowly   Complete by: As directed       Allergies as of 06/10/2021       Reactions   Cefazolin Rash, Other (See Comments)   The patient had surgery and was given cefazolin intraop. ~ 10 days later he developed a rash confirmed by biopsy to be consistent w/ drug eruption. We cannot know for sure, but this is the most likely agent.         Medication List     STOP taking these medications    azithromycin 250 MG tablet Commonly known as: ZITHROMAX   clopidogrel 75 MG tablet Commonly known as: PLAVIX   dexamethasone 4 MG tablet Commonly known as: DECADRON       TAKE these medications    acetaminophen 650 MG CR tablet Commonly known as: TYLENOL Take 1,300 mg by mouth at bedtime.   albuterol 108 (90 Base) MCG/ACT inhaler Commonly known as: VENTOLIN HFA Inhale 2 puffs into the lungs every 6 (six) hours as needed for wheezing or shortness of breath.   aspirin 81 MG EC tablet Take 1 tablet (81 mg total) by mouth daily. Swallow whole. Start taking on: June 11, 2021   atorvastatin 40 MG tablet Commonly known as: LIPITOR Take 1 tablet (40 mg total) by mouth daily.   D-3-5 125 MCG (5000 UT) capsule Generic drug: Cholecalciferol Take 5,000 Units by mouth every morning.   finasteride 5 MG tablet Commonly known as: PROSCAR Take 1 tablet (5 mg total) by mouth daily. What changed: when to take this   fluticasone furoate-vilanterol 200-25 MCG/ACT Aepb Commonly known as: BREO ELLIPTA Inhale 1 puff into the lungs daily. Start taking on: June 11, 2021   furosemide 40 MG tablet Commonly known as: LASIX Take 1 tablet (40 mg total) by mouth daily. Start taking on: June 11, 2021   gabapentin 300 MG capsule Commonly known as: NEURONTIN Take 1 capsule (300 mg total) by mouth 2  (two) times daily. What changed: when to take this   guaiFENesin 200 MG tablet Take 2 tablets (400 mg total)  by mouth 2 (two) times daily.   loperamide 2 MG capsule Commonly known as: IMODIUM Take 2 mg by mouth daily as needed for diarrhea or loose stools.   methocarbamol 500 MG tablet Commonly known as: Robaxin Take 1 tablet (500 mg total) by mouth at bedtime as needed for muscle spasms. What changed: when to take this   metoprolol tartrate 25 MG tablet Commonly known as: LOPRESSOR Take 0.5 tablets (12.5 mg total) by mouth 2 (two) times daily.   mirabegron ER 25 MG Tb24 tablet Commonly known as: MYRBETRIQ Take 25 mg by mouth every morning.   olmesartan 40 MG tablet Commonly known as: BENICAR Take 1 tablet (40 mg total) by mouth daily. Please hold this medication, please discuss with your cardiology when should you restart this medication, What changed: additional instructions   PRESCRIPTION MEDICATION Inhale into the lungs at bedtime. BIPAP   saccharomyces boulardii 250 MG capsule Commonly known as: Florastor Take 1 capsule (250 mg total) by mouth 2 (two) times daily for 9 days.   tamsulosin 0.4 MG Caps capsule Commonly known as: FLOMAX Take 1 capsule (0.4 mg total) by mouth daily after supper.   vitamin B-12 1000 MCG tablet Commonly known as: CYANOCOBALAMIN Take 1,000 mcg by mouth every morning.               Durable Medical Equipment  (From admission, onward)           Start     Ordered   06/08/21 1317  For home use only DME oxygen  Once       Question Answer Comment  Length of Need Lifetime   Mode or (Route) Nasal cannula   Liters per Minute 2   Frequency Continuous (stationary and portable oxygen unit needed)   Oxygen conserving device Yes   Oxygen delivery system Gas      06/08/21 1316   06/02/21 1348  For home use only DME oxygen  Once       Comments: Patient Saturations on Room Air at Rest = 77%   Patient Saturations on Room Air while  Ambulating = %   Patient Saturations on 4 Liters of oxygen while Ambulating = 92%   Please briefly explain why patient needs home oxygen:Desats at rest 70s 87-92 on 4l n/c  Question Answer Comment  Length of Need Lifetime   Mode or (Route) Nasal cannula   Liters per Minute 4   Frequency Continuous (stationary and portable oxygen unit needed)   Oxygen conserving device Yes   Oxygen delivery system Gas      06/02/21 1347              Discharge Care Instructions  (From admission, onward)           Start     Ordered   06/10/21 0000  Discharge wound care:       Comments: Pressure offloading, Foam dressing to open area on buttocks, change every 3 days or as needed for soiling   06/10/21 1031   06/09/21 0000  Discharge wound care:       Comments: Pressure offloading   06/09/21 1128   06/08/21 0000  Discharge wound care:       Comments: Foal dressing to open areas on buttocks, change q3days or as needed if soiling Pressure off loading F/u with pcp to would healing   06/08/21 1010           Allergies  Allergen Reactions   Cefazolin Rash and  Other (See Comments)    The patient had surgery and was given cefazolin intraop. ~ 10 days later he developed a rash confirmed by biopsy to be consistent w/ drug eruption. We cannot know for sure, but this is the most likely agent.      Follow-up Information     Tanda Rockers, MD Follow up on 06/23/2021.   Specialty: Pulmonary Disease Why: [facility] Follow Up / Pulmonary   Appt at 10:30 AM with Dr. Melvyn Novas.  Please arrive at 10:15 for check in. Contact information: [place] [address] [phone]         Eileen Stanford, PA-C Follow up on 06/15/2021.   Specialties: Cardiology, Radiology Why: @ 1:30pm, please arrive at least 10 minutes early. Contact information: [address] [place] [address] [phone]         [name], MD Follow up in  1 week(s).   Specialties: Family Medicine, Emergency Medicine Why: hospital discharge follow up , please bring in your discharge paper with you to review with your pcp. pcp to repeat basic lab work including cbc/bmp. please follow up with your pcp to monitor blood sugar. Contact information: [place] [address] [phone]         [facility] Follow up.   Why: home 02 arranged- tank to be delivered to room for transport home- you will need your own concentrator for home- they will contact you for delivery on discharge Contact information: [place] [address] [phone]         [facility] Follow up.   Specialty: [place] Why: HHPT/SW arranged- they will contact you to schedule home visits Contact information: [place] [address] [phone]                  The results of significant diagnostics from this hospitalization (including imaging, microbiology, ancillary and laboratory) are listed below for reference.    Significant Diagnostic Studies: DG Orthopantogram  Result Date: 06/01/2021 CLINICAL DATA:  Poor dentition. EXAM: ORTHOPANTOGRAM/PANORAMIC COMPARISON:  None. FINDINGS: Patient motion blurs the central incisors. Dental fillings are present in the upper and lower molars bilaterally. No significant dental caries are present. No significant periapical lucencies are present. The mandible is intact and located. IMPRESSION: 1. Dental fillings in the upper and lower molars bilaterally. 2. No significant dental caries. Electronically Signed   By: San Morelle M.D.   On: 06/01/2021 11:46   DG Chest 2 View  Result Date: 06/06/2021 CLINICAL DATA:  Preoperative assessment. EXAM: CHEST - 2 VIEW COMPARISON:  05/27/2021. FINDINGS: The heart size and mediastinal contours are stable. Atherosclerotic calcification of the aorta is noted. Lung volumes are low and  patchy airspace disease is present at the lung bases. There is mild blunting of the costophrenic angles bilaterally. No pneumothorax. No acute osseous abnormality. IMPRESSION: 1. Low lung volumes with patchy atelectasis or infiltrate at the lung bases. 2. Small bilateral pleural effusions. 3. Aortic atherosclerosis. Electronically Signed   By: Brett Fairy M.D.   On: 06/06/2021 20:45   DG Chest 2 View  Result Date: 05/27/2021 CLINICAL DATA:  Shortness of breath, cough EXAM: CHEST - 2 VIEW COMPARISON:  04/07/2019 FINDINGS: Eventration of the right hemidiaphragm. Small right and trace left pleural effusions. Associated bibasilar atelectasis. No frank interstitial edema.  Mild cardiomegaly.  No  pneumothorax. IMPRESSION: Mild cardiomegaly with small right and trace left pleural effusions. Associated bibasilar atelectasis. No frank interstitial edema. Electronically Signed   By: Julian Hy M.D.   On: 05/27/2021 14:30   CARDIAC CATHETERIZATION  Result Date: 05/31/2021   Prox LAD lesion is 25% stenosed.   Dist Cx lesion is 40% stenosed.   Ost 2nd Mrg to 2nd Mrg lesion is 70% stenosed.   Dist RCA lesion is 60% stenosed.   Ost RPDA to RPDA lesion is 40% stenosed. The LAD is a large caliber vessel that courses to the apex. There is mild mid LAD stenosis The Circumflex is a large caliber vessel that gives off a small obtuse marginal branch then 2 moderate caliber obtuse marginal branches. The second obtuse marginal branch has a moderate stenosis, unchanged from last cath in 2019. The RCA is a large dominant artery. The distal RCA has a moderate stenosis, unchanged from last cath in 2019. Severe aortic stenosis (mean gradient 50.6 mmHg, 59 mmHg, AVA 0.93 cm2). Recommendations: Will continue workup for TAVR. His right and left heart pressures are only mildly abnormal following inpatient diuresis. Continue diuresis. The structural heart team will provide a formal consultation regarding his candidacy for TAVR.    CT CORONARY MORPH W/CTA COR W/SCORE W/CA W/CM &/OR WO/CM  Addendum Date: 06/01/2021   ADDENDUM REPORT: 06/01/2021 22:12 CLINICAL DATA:  Severe Aortic Stenosis. EXAM: Cardiac TAVR CT TECHNIQUE: A non-contrast, gated CT scan was obtained with axial slices of 3 mm through the heart for aortic valve calcium scoring. A 110 kV retrospective, gated, contrast cardiac scan was obtained. Gantry rotation speed was 250 msecs and collimation was 0.6 mm. Nitroglycerin was not given. The 3D data set was reconstructed in 5% intervals of the 0-95% of the R-R cycle. Systolic and diastolic phases were analyzed on a dedicated workstation using MPR, MIP, and VRT modes. The patient received 100 cc of contrast. FINDINGS: Image quality: Poor. The contrast remained in the SVC and the study had poor contrast opacification. Noise artifact is: Moderate. Valve Morphology: Tricuspid aortic valve that is severely calcified. There is bulky calcification of the [place]. Aortic Valve Calcium score: 2634 Aortic annular dimension: Phase assessed: 25% Annular area: 574 mm2 Annular perimeter: 86.2 mm Max diameter: 30.1 mm Min diameter: 24.6 mm Membranous septum length: 13.4 mm Annular and subannular calcification: None. Optimal coplanar projection: RAO 3 CAU 15 Coronary Artery Height above Annulus: Left Main: 9.2 mm Right Coronary: 16.0 mm Sinus of Valsalva Measurements: Non-coronary: 33.5 mm Right-coronary: 33.6 mm Left-coronary: 34.5 mm Sinus of Valsalva Height: Non-coronary: 25.3 mm Right-coronary: 23.9 mm Left-coronary: 19.5 mm Sinotubular Junction: 28 mm Ascending Thoracic Aorta: 35 mm Coronary Arteries: Normal coronary origin. Right dominance. The study was performed without use of NTG and is insufficient for plaque evaluation. Please refer to recent cardiac catheterization for coronary assessment. 3-vessel coronary calcifications noted. Cardiac Morphology: Right Atrium: Right atrial size is within normal limits. Contrast reflux into the RA  consistent with elevated RA pressure. Right Ventricle: The right ventricular cavity is within normal limits. Left Atrium: Left atrial size is normal in size with no left atrial appendage filling defect. Left Ventricle: The ventricular cavity size is within normal limits. There are no stigmata of prior infarction. There is no abnormal filling defect. Normal left ventricular function, LVEF=56%. No regional wall motion abnormalities. Pulmonary arteries: Dilated suggestive of pulmonary hypertension. Pulmonary veins: Normal pulmonary venous drainage. Pericardium: Normal thickness with no significant effusion or calcium present. Mitral Valve: The mitral valve  is normal structure without significant calcification. Extra-cardiac findings: See attached radiology report for non-cardiac structures. IMPRESSION: 1. 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 2. Annular measurements support a 29 mm S3 TAVR (574 mm2). 3. No significant annular or subannular calcifications. 4. Shallow left main height concerning for increased risk for coronary obstruction (9.2 mm). 5. Optimal Fluoroscopic Angle for Delivery: RAO 3 CAU 15 6. Dilated pulmonary artery suggestive of pulmonary hypertension. [place] T. Audie Box, MD Electronically Signed   By: Eleonore Chiquito M.D.   On: 06/01/2021 22:12   Result Date: 06/01/2021 EXAM: OVER-READ INTERPRETATION  CT CHEST The following report is an over-read performed by radiologist Dr. Salvatore Marvel of [facility], [place] on 06/01/2021. This over-read does not include interpretation of cardiac or coronary anatomy or pathology. The coronary CTA interpretation by the cardiologist is attached. COMPARISON:  06/26/2017 chest CT angiogram. FINDINGS: Please see the separate concurrent chest CT angiogram report for details. IMPRESSION: Please see the separate concurrent chest CT angiogram report for details. Electronically Signed: By: Ilona Sorrel M.D. On: 06/01/2021 13:19   CT ANGIO CHEST AORTA W/CM &  OR WO/CM  Result Date: 06/01/2021 CLINICAL DATA:  Inpatient. 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 Aortic stenosis. EXAM: CT ANGIOGRAPHY CHEST, ABDOMEN AND PELVIS TECHNIQUE: Multidetector CT imaging through the chest, abdomen and pelvis was performed using the standard protocol during bolus administration of intravenous contrast. Multiplanar reconstructed images and MIPs were obtained and reviewed to evaluate the vascular anatomy. CONTRAST:  135mL OMNIPAQUE IOHEXOL 350 MG/ML SOLN COMPARISON:  06/26/2017 CT angiogram of the chest, abdomen and pelvis. 05/12/2018 unenhanced CT abdomen/pelvis. 10/25/2018 MRI abdomen. FINDINGS: CTA CHEST FINDINGS Cardiovascular: Borderline mild cardiomegaly. No significant pericardial effusion/thickening. Diffuse thickening and coarse calcification of the aortic valve. Three-vessel coronary atherosclerosis. Atherosclerotic nonaneurysmal thoracic aorta. Top-normal caliber main pulmonary artery (3.1 cm diameter). No central pulmonary emboli. Mediastinum/Nodes: No discrete thyroid nodules. Unremarkable esophagus. No pathologically enlarged axillary, mediastinal or hilar lymph nodes. Lungs/Pleura: No pneumothorax. Small dependent bilateral pleural effusions. Severe centrilobular emphysema with diffuse bronchial wall thickening. Saber sheath trachea. Right upper lobe 0.3 cm solid pulmonary nodule (series 8/image 64), stable since 06/26/2017 chest CT. Mild-to-moderate dependent bilateral lower lobe atelectasis. No lung masses or additional significant pulmonary nodules in the aerated portions of the lungs. Musculoskeletal: No aggressive appearing focal osseous lesions. Mild thoracic spondylosis. Marked degenerative disc disease throughout the cervical spine. CTA ABDOMEN AND PELVIS FINDINGS Hepatobiliary: Normal liver size. Several scattered subcentimeter simple liver cysts throughout the liver, not appreciably changed from 10/25/2018 MRI abdomen. No appreciable new liver lesions.  Cholelithiasis. No biliary ductal dilatation. Pancreas: Normal, with no mass or duct dilation. Spleen: Normal size. No mass. Adrenals/Urinary Tract: Normal right adrenal. Left adrenal 2.3 cm nodule with density 44 HU, stable since at least 05/12/2018 unenhanced CT abdomen study, compatible with an adenoma. Hypodense exophytic 1.6 cm anterior interpolar left renal cortical lesion (series 7/image 150), indeterminate, mildly increased from 1.2 cm on 10/25/2018 MRI, not well evaluated on prior MRI due to artifact. Simple 2.2 cm anterior lower left renal cortical cyst. Additional scattered subcentimeter hypodense bilateral renal cortical lesions are too small to characterize. No hydronephrosis. Chronic mild diffuse bladder wall thickening and trabeculation, similar. Small superior bladder wall diverticula. Stomach/Bowel: Normal non-distended stomach. Postsurgical changes from prior small bowel resection with enteroenterostomy in the mid abdomen. No unexpected small bowel dilatation. No small bowel wall thickening. Marked diffuse colonic diverticulosis, most prominent in the sigmoid colon, with no acute large bowel wall thickening or pericolonic  fat stranding. 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 Ectatic 2.8 cm diameter right common iliac artery, increased from 2.4 cm on 06/26/2017 CT. No pathologically enlarged lymph nodes in the abdomen or pelvis. Reproductive: Top-normal size prostate with postsurgical changes from TURP in the right prostate. Nonspecific coarse internal prostatic calcifications. Other: No pneumoperitoneum, ascites or focal fluid collection. Musculoskeletal: No aggressive appearing focal osseous lesions. Bilateral posterior spinal fusion hardware L2-L5. 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 VASCULAR MEASUREMENTS PERTINENT TO TAVR: AORTA: Minimal  Aortic Diameter-18.1 x 17.1 mm Severity of Aortic Calcification-severe RIGHT PELVIS: Right Common Iliac Artery - Minimal Diameter-11.8 x 10.9 mm Tortuosity-mild Calcification-moderate (predominantly stented) Right External Iliac Artery - Minimal Diameter-7.4 x 6.2 mm Tortuosity-severe Calcification-severe Right Common Femoral Artery - Minimal Diameter-9.1 x 8.5 mm Tortuosity-mild Calcification-severe LEFT PELVIS: Left Common Iliac Artery - Minimal Diameter-9.7 x 9.1 mm Tortuosity-mild-to-moderate Calcification-severe (entirely stented) Left External Iliac Artery - Minimal Diameter-7.2 x 6.6 mm Tortuosity-mild Calcification-severe (stented proximally) Left Common Femoral Artery - Minimal Diameter-9.2 x 7.8 mm Tortuosity-mild Calcification-severe Review of the MIP images confirms the above findings. IMPRESSION: 1. Vascular findings and measurements pertinent to potential TAVR procedure, as detailed. 2. Diffuse thickening and coarse calcification of the aortic valve, compatible with reported aortic stenosis. 3. Borderline mild cardiomegaly. Three-vessel coronary atherosclerosis. 4. Small dependent bilateral pleural effusions. 5. Severe centrilobular emphysema with diffuse bronchial wall thickening and saber sheath trachea, suggesting COPD. 6. Hypodense exophytic 1.6 cm anterior interpolar left renal cortical lesion, indeterminate for renal neoplasm, mildly increased from 1.2 cm on 10/25/2018 MRI, not well evaluated on prior MRI due to artifact. Suggest attention on follow-up renal mass protocol CT abdomen without and with IV contrast in 3-6 months. 7. Cholelithiasis. 8. Marked diffuse colonic diverticulosis. 9. Stable left adrenal adenoma. 10. 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 11. Aortic Atherosclerosis (ICD10-I70.0) and Emphysema (ICD10-J43.9). Electronically Signed   By: Ilona Sorrel M.D.    On: 06/01/2021 14:14   ECHOCARDIOGRAM COMPLETE  Result Date: 06/08/2021    ECHOCARDIOGRAM REPORT   Patient Name:   ANTOIN DARGIS Date of Exam: 06/08/2021 Medical Rec #:  [id number]       Height:       67.0 in Accession #:    [id number]      Weight:       214.7 lb Date of Birth:  Jul 04, 1945      BSA:          2.084 m Patient Age:    2 years        BP:           148/77 mmHg Patient Gender: M               HR:           92 bpm. Exam Location:  Inpatient Procedure: 2D Echo, Cardiac Doppler and Color Doppler Indications:    S/p TAVR  History:        Patient has prior history of Echocardiogram examinations. CAD,                 Stroke; Risk Factors:Sleep Apnea and Hypertension.                 Aortic Valve: 29 mm Edwards Ultra, stented (TAVR) valve is  present in the aortic position.  Sonographer:    Jyl Heinz Referring Phys: [id number] [place]  1. The aortic valve has been repaired/replaced. There is a 29 mm Edwards Ultra, stented (TAVR) valve present in the aortic position. Echo findings are consistent with normal structure and function of the aortic valve prosthesis.     Mean gradient 54mmHg, DI 0.5. No paravalvular leak.  2. Left ventricular ejection fraction, by estimation, is 60 to 65%. The left ventricle has normal function. The left ventricle has no regional wall motion abnormalities. There is severe concentric left ventricular hypertrophy. Indeterminate diastolic filling due to E-A fusion.  3. Right ventricular systolic function is normal. The right ventricular size is normal. There is normal pulmonary artery systolic pressure. The estimated right ventricular systolic pressure is 88.5 mmHg.  4. Left atrial size was mildly dilated.  5. The mitral valve is degenerative. There is moderate mitral annular calcification. Trivial-to-mild mitral valve regurgitation.  6. The inferior vena cava is dilated in size with >50% respiratory variability, suggesting right atrial pressure  of 8 mmHg. Comparison(s): Compared to prior TTE on 05/28/21, the patient is now s/p TAVR with normal functioning prosthesis on doppler interrogation. Mitral regurgitation now appears improved to trivial-to-mild (previously moderate). FINDINGS  Left Ventricle: Left ventricular ejection fraction, by estimation, is 60 to 65%. The left ventricle has normal function. The left ventricle has no regional wall motion abnormalities. The left ventricular internal cavity size was normal in size. There is  severe concentric left ventricular hypertrophy. Indeterminate diastolic filling due to E-A fusion. Right Ventricle: The right ventricular size is normal. No increase in right ventricular wall thickness. Right ventricular systolic function is normal. There is normal pulmonary artery systolic pressure. The tricuspid regurgitant velocity is 2.00 m/s, and  with an assumed right atrial pressure of 8 mmHg, the estimated right ventricular systolic pressure is 02.7 mmHg. Left Atrium: Left atrial size was mildly dilated. Right Atrium: Right atrial size was normal in size. Pericardium: There is no evidence of pericardial effusion. Mitral Valve: The mitral valve is degenerative in appearance. There is mild thickening of the mitral valve leaflet(s). There is mild calcification of the mitral valve leaflet(s). Moderate mitral annular calcification. Mild mitral valve regurgitation. Tricuspid Valve: The tricuspid valve is normal in structure. Tricuspid valve regurgitation is mild. Aortic Valve: The aortic valve has been repaired/replaced. Aortic valve regurgitation is not visualized. Aortic valve mean gradient measures 16.5 mmHg. Aortic valve peak gradient measures 28.5 mmHg. Aortic valve area, by VTI measures 1.46 cm. There is a  29 mm Edwards Ultra, stented (TAVR) valve present in the aortic position. Echo findings are consistent with normal structure and function of the aortic valve prosthesis. Pulmonic Valve: The pulmonic valve was  normal in structure. Pulmonic valve regurgitation is trivial. Aorta: The aortic root and ascending aorta are structurally normal, with no evidence of dilitation. Venous: The inferior vena cava is dilated in size with greater than 50% respiratory variability, suggesting right atrial pressure of 8 mmHg. IAS/Shunts: The atrial septum is grossly normal.  LEFT VENTRICLE PLAX 2D LVIDd:         5.90 cm      Diastology LVIDs:         3.50 cm      LV e' medial:  5.66 cm/s LV PW:         1.20 cm      LV e' lateral: 5.00 cm/s LV IVS:        1.30 cm  LVOT diam:     2.00 cm LV SV:         69 LV SV Index:   33 LVOT Area:     3.14 cm  LV Volumes (MOD) LV vol d, MOD A2C: 153.0 ml LV vol d, MOD A4C: 176.0 ml LV vol s, MOD A2C: 59.8 ml LV vol s, MOD A4C: 66.7 ml LV SV MOD A2C:     93.2 ml LV SV MOD A4C:     176.0 ml LV SV MOD BP:      99.8 ml RIGHT VENTRICLE             IVC RV Basal diam:  3.90 cm     IVC diam: 2.30 cm RV Mid diam:    2.90 cm RV S prime:     20.00 cm/s TAPSE (M-mode): 2.8 cm LEFT ATRIUM             Index        RIGHT ATRIUM           Index LA diam:        4.20 cm 2.02 cm/m   RA Area:     21.20 cm LA Vol (A2C):   78.7 ml 37.76 ml/m  RA Volume:   59.30 ml  28.45 ml/m LA Vol (A4C):   59.1 ml 28.36 ml/m LA Biplane Vol: 73.4 ml 35.22 ml/m  AORTIC VALVE AV Area (Vmax):    1.44 cm AV Area (Vmean):   1.44 cm AV Area (VTI):     1.46 cm AV Vmax:           267.00 cm/s AV Vmean:          194.000 cm/s AV VTI:            0.469 m AV Peak Grad:      28.5 mmHg AV Mean Grad:      16.5 mmHg LVOT Vmax:         122.00 cm/s LVOT Vmean:        89.200 cm/s LVOT VTI:          0.219 m LVOT/AV VTI ratio: 0.47  AORTA Ao Asc diam: 3.30 cm TRICUSPID VALVE TR Peak grad:   16.0 mmHg TR Vmax:        200.00 cm/s  SHUNTS Systemic VTI:  0.22 m Systemic Diam: 2.00 cm Gwyndolyn Kaufman MD Electronically signed by Gwyndolyn Kaufman MD Signature Date/Time: 06/08/2021/11:48:39 AM    Final    ECHOCARDIOGRAM COMPLETE  Result Date: 05/28/2021     ECHOCARDIOGRAM REPORT   Patient Name:   KYDAN SHANHOLTZER Date of Exam: 05/28/2021 Medical Rec #:  [id number]       Height:       66.0 in Accession #:    [id number]      Weight:       226.6 lb Date of Birth:  [birth year]      BSA:          2.109 m Patient Age:    58 years        BP:           139/81 mmHg Patient Gender: M               HR:           71 bpm. Exam Location:  Inpatient Procedure: 2D Echo, Cardiac Doppler and Color Doppler Indications:    CHF  History:        Patient has prior history  of Echocardiogram examinations, most                 recent 01/30/2018. CAD and Previous Myocardial Infarction, COPD,                 Aortic Valve Disease; Risk Factors:Former Smoker, Hypertension                 and Dyslipidemia. Hx stroke.  Sonographer:    Clayton Lefort RDCS (AE) Referring Phys: [place]  1. Left ventricular ejection fraction, by estimation, is approximately 55%. The left ventricle has normal function. Left ventricular endocardial border not optimally defined to evaluate regional wall motion. There is severe concentric left ventricular hypertrophy. Left ventricular diastolic parameters are consistent with Grade I diastolic dysfunction (impaired relaxation). Elevated left ventricular end-diastolic pressure.  2. Right ventricular systolic function is normal. The right ventricular size is normal. There is mildly elevated pulmonary artery systolic pressure. The estimated right ventricular systolic pressure is 52.7 mmHg.  3. Left atrial size was mildly dilated.  4. Right atrial size was mildly dilated.  5. There is trivial pericardial effusion surrounding the apex.  6. The mitral valve is degenerative. Moderate mitral valve regurgitation and very eccentric. The mean mitral valve gradient is 3.0 mmHg. Moderate mitral annular calcification.  7. Tricuspid valve regurgitation is moderate.  8. The aortic valve is tricuspid. There is severe calcifcation of the aortic valve. Aortic valve regurgitation is  not visualized. Severe aortic valve stenosis. Aortic valve mean gradient measures 56.5 mmHg. Dimentionless index 0.19.  9. The inferior vena cava is dilated in size with <50% respiratory variability, suggesting right atrial pressure of 15 mmHg. Comparison(s): Prior images reviewed side by side. Aortic stenosis has progressed and in severe range. Mitral regurgitation is very eccentric and likely moderate. FINDINGS  Left Ventricle: Left ventricular ejection fraction, by estimation, is 55%. The left ventricle has normal function. Left ventricular endocardial border not optimally defined to evaluate regional wall motion. The left ventricular internal cavity size was normal in size. There is severe concentric left ventricular hypertrophy. Left ventricular diastolic parameters are consistent with Grade I diastolic dysfunction (impaired relaxation). Elevated left ventricular end-diastolic pressure. Right Ventricle: The right ventricular size is normal. No increase in right ventricular wall thickness. Right ventricular systolic function is normal. There is mildly elevated pulmonary artery systolic pressure. The tricuspid regurgitant velocity is 2.37  m/s, and with an assumed right atrial pressure of 15 mmHg, the estimated right ventricular systolic pressure is 78.2 mmHg. Left Atrium: Left atrial size was mildly dilated. Right Atrium: Right atrial size was mildly dilated. Pericardium: Trivial pericardial effusion is present. The pericardial effusion is surrounding the apex. Mitral Valve: The mitral valve is degenerative in appearance. There is mild thickening of the mitral valve leaflet(s). There is mild calcification of the mitral valve leaflet(s). Moderate mitral annular calcification. Moderate mitral valve regurgitation.  MV peak gradient, 4.9 mmHg. The mean mitral valve gradient is 3.0 mmHg. Tricuspid Valve: The tricuspid valve is grossly normal. Tricuspid valve regurgitation is moderate. Aortic Valve: The aortic valve  is tricuspid. There is severe calcifcation of the aortic valve. Aortic valve regurgitation is not visualized. Severe aortic stenosis is present. Aortic valve mean gradient measures 56.5 mmHg. Aortic valve peak gradient measures 93.5 mmHg. Aortic valve area, by VTI measures 0.61 cm. Pulmonic Valve: The pulmonic valve was grossly normal. Pulmonic valve regurgitation is trivial. Aorta: The aortic root is normal in size and structure. Venous: The inferior vena cava is  dilated in size with less than 50% respiratory variability, suggesting right atrial pressure of 15 mmHg. IAS/Shunts: No atrial level shunt detected by color flow Doppler.  LEFT VENTRICLE PLAX 2D LVIDd:         4.10 cm   Diastology LVIDs:         2.80 cm   LV e' medial:    4.35 cm/s LV PW:         2.50 cm   LV E/e' medial:  18.2 LV IVS:        2.00 cm   LV e' lateral:   6.85 cm/s LVOT diam:     2.00 cm   LV E/e' lateral: 11.5 LV SV:         67 LV SV Index:   32 LVOT Area:     3.14 cm  RIGHT VENTRICLE             IVC RV Basal diam:  4.40 cm     IVC diam: 2.80 cm RV Mid diam:    3.40 cm RV S prime:     17.80 cm/s TAPSE (M-mode): 2.6 cm LEFT ATRIUM             Index        RIGHT ATRIUM           Index LA diam:        3.90 cm 1.85 cm/m   RA Area:     23.90 cm LA Vol (A2C):   82.8 ml 39.26 ml/m  RA Volume:   79.30 ml  37.60 ml/m LA Vol (A4C):   76.5 ml 36.27 ml/m LA Biplane Vol: 82.5 ml 39.12 ml/m  AORTIC VALVE AV Area (Vmax):    0.59 cm AV Area (Vmean):   0.58 cm AV Area (VTI):     0.61 cm AV Vmax:           483.50 cm/s AV Vmean:          352.000 cm/s AV VTI:            1.105 m AV Peak Grad:      93.5 mmHg AV Mean Grad:      56.5 mmHg LVOT Vmax:         90.10 cm/s LVOT Vmean:        65.500 cm/s LVOT VTI:          0.213 m LVOT/AV VTI ratio: 0.19  AORTA Ao Root diam: 3.50 cm Ao Asc diam:  3.10 cm MITRAL VALVE                  TRICUSPID VALVE MV Area (PHT): 2.42 cm       TR Peak grad:   22.5 mmHg MV Area VTI:   2.14 cm       TR Vmax:        237.00  cm/s MV Peak grad:  4.9 mmHg MV Mean grad:  3.0 mmHg       SHUNTS MV Vmax:       1.11 m/s       Systemic VTI:  0.21 m MV Vmean:      76.2 cm/s      Systemic Diam: 2.00 cm MV Decel Time: 313 msec MR Peak grad:    64.0 mmHg MR Mean grad:    39.0 mmHg MR Vmax:         400.00 cm/s MR Vmean:        290.0 cm/s MR PISA:  1.57 cm MR PISA Eff ROA: 12 mm MR PISA Radius:  0.50 cm MV E velocity: 79.10 cm/s MV A velocity: 101.00 cm/s MV E/A ratio:  0.78 Rozann Lesches MD Electronically signed by Rozann Lesches MD Signature Date/Time: 05/28/2021/2:45:46 PM    Final    ECHO INTRAOPERATIVE TEE  Result Date: 06/07/2021  *INTRAOPERATIVE TRANSESOPHAGEAL REPORT *  Patient Name:   DEMITRIUS CRASS Date of Exam: 06/07/2021 Medical Rec #:  [id number]       Height:       67.0 in Accession #:    [id number]      Weight:       210.3 lb Date of Birth:  11/26/1945      BSA:          2.07 m Patient Age:    42 years        BP:           135/72 mmHg Patient Gender: M               HR:           66 bpm. Exam Location:  Anesthesiology Transesophogeal exam was perform intraoperatively during surgical procedure. Patient was closely monitored under general anesthesia during the entirety of examination. Indications:     Aortic Stenosis i35.0 Sonographer:     Raquel Sarna Senior RDCS Performing Phys: [place] Diagnosing Phys: Jenkins Rouge MD PROCEDURE: Intraoperative Transesophogeal 38mm Edwards S3U TAVR Implanted. Complications: No known complications during this procedure. POST-OP IMPRESSIONS _ Left Ventricle: has normal systolic function. The cavity size was normal. _ Right Ventricle: The cavity was normal. _ Aortic Valve: There is no regurgitation. No regurgitation post repair. The gradient recorded across the prosthetic valve is within the expected range. _ Mitral Valve: There is no regurgitation. No regurgitation post repair. The gradient recorded across the prosthetic valve is within the expected range. _ Tricuspid Valve: There is  no regurgitation. No regurgitation post repair. The gradient recorded across the prosthetic valve is within the expected range. _ Comments: Pre TAVR : tri leaflet calcified with severe AS mean gradient 42 peakl 65 mmHg AVA 0.4 cm2 no AR  Post TAVR: well positioned 29 mm Sapien 3 Ultra valve No PVL mean gradient 5 peak 10 mmhg AVA 2.6 cm2. PRE-OP FINDINGS  Left Ventricle: The left ventricle has normal systolic function, with an ejection fraction of 60-65%. The cavity size was normal. There is moderately increased left ventricular wall thickness. There is moderate left ventricular hypertrophy. Right Ventricle: The right ventricle has normal systolic function. The cavity was normal. There is no increase in right ventricular wall thickness. Left Atrium: Left atrial size was dilated. No left atrial/left atrial appendage thrombus was detected. Right Atrium: Right atrial size was normal in size. Interatrial Septum: No atrial level shunt detected by color flow Doppler. Pericardium: Trivial pericardial effusion is present. The pericardial effusion is anterior to the right ventricle. Trivial pericardial effusion anterior to RV present prior to TAVR and stable with no changes post deployment. Mitral Valve: Thickened. Mitral valve regurgitation is mild by color flow Doppler. Tricuspid Valve: The tricuspid valve was normal in structure. Tricuspid valve regurgitation was not visualized by color flow Doppler. Aortic Valve: The aortic valve has been repaired/replaced Aortic valve regurgitation was not assessed by color flow Doppler. Pre TAVR : tri leaflet calcified with severe AS mean gradient 42 peakl 65 mmHg AVA 0.4 cm2 no AR Post TAVR: well positioned 29 mm Sapien 3 Ultra valve No PVL  mean gradient 5 peak 10 mmhg AVA 2.6 cm2. Pulmonic Valve: The pulmonic valve was normal in structure, with normal. The gradient recorded across the pulmonic valve is within the expected range. Pulmonic valve regurgitation was not assessed by color  flow Doppler. Venous: The inferior vena cava is normal in size with greater than 50% respiratory variability, suggesting right atrial pressure of 3 mmHg. +--------------+--------++  LEFT VENTRICLE            +--------------+--------++  PLAX 2D                   +--------------+--------++  LVOT diam:     2.30 cm    +--------------+--------++  LVOT Area:     4.15 cm   +--------------+--------++                            +--------------+--------++ +------------------+------------++  AORTIC VALVE                      +------------------+------------++  AV Area (Vmax):    2.98 cm       +------------------+------------++  AV Area (Vmean):   3.01 cm       +------------------+------------++  AV Area (VTI):     2.55 cm       +------------------+------------++  AV Vmax:           155.00 cm/s    +------------------+------------++  AV Vmean:          102.000 cm/s   +------------------+------------++  AV VTI:            0.298 m        +------------------+------------++  AV Peak Grad:      9.6 mmHg       +------------------+------------++  AV Mean Grad:      5.0 mmHg       +------------------+------------++  LVOT Vmax:         111.00 cm/s    +------------------+------------++  LVOT Vmean:        73.800 cm/s    +------------------+------------++  LVOT VTI:          0.183 m        +------------------+------------++  LVOT/AV VTI ratio: 0.61           +------------------+------------++ +-------------+--------++  MITRAL VALVE             +--------------+-------+ +-------------+--------++  SHUNTS                   MV Peak grad: 3.6 mmHg   +--------------+-------+ +-------------+--------++  Systemic VTI:  0.18 m    MV Vmax:      0.95 m/s   +--------------+-------+ +-------------+--------++  Systemic Diam: 2.30 cm                            +--------------+-------+  Jenkins Rouge MD Electronically signed by Jenkins Rouge MD Signature Date/Time: 06/07/2021/9:59:43 AM    Final    VAS US CAROTID  Result Date: 06/01/2021 Carotid  Arterial Duplex Study Patient Name:  SYLVAN SOOKDEO  Date of Exam:   06/01/2021 Medical Rec #: [id number]        Accession #:    [id number] Date of Birth: July 06, 1945       Patient Gender: M Patient Age:   63 years Exam Location:  [facility] Procedure:      VAS US CAROTID Referring Phys: Barb Merino --------------------------------------------------------------------------------  Indications:       Stenosis. Risk Factors:      Hypertension, current smoker. Limitations        Today's exam was limited due to the body habitus of the                    patient, the high bifurcation of the carotid, heavy                    calcification and the resulting shadowing, the patient's                    respiratory variation and patient movement. Comparison Study:  No prior studies. Performing Technologist: Oliver Hum RVT  Examination Guidelines: A complete evaluation includes B-mode imaging, spectral Doppler, color Doppler, and power Doppler as needed of all accessible portions of each vessel. Bilateral testing is considered an integral part of a complete examination. Limited examinations for reoccurring indications may be performed as noted.  Right Carotid Findings: +----------+--------+--------+--------+-----------------------+--------+             PSV cm/s EDV cm/s Stenosis Plaque Description      Comments  +----------+--------+--------+--------+-----------------------+--------+  CCA Prox   65       1                 smooth and heterogenous           +----------+--------+--------+--------+-----------------------+--------+  CCA Distal 43       4                 smooth and heterogenous           +----------+--------+--------+--------+-----------------------+--------+  ICA Prox                     Occluded calcific                          +----------+--------+--------+--------+-----------------------+--------+  ICA Distal                   Occluded                                    +----------+--------+--------+--------+-----------------------+--------+  ECA        75       7                                                   +----------+--------+--------+--------+-----------------------+--------+ +----------+--------+-------+--------+-------------------+             PSV cm/s EDV cms Describe Arm Pressure (mmHG)  +----------+--------+-------+--------+-------------------+  Subclavian 77                                             +----------+--------+-------+--------+-------------------+ +---------+--------+---+--------+--+---------+  Vertebral PSV cm/s 167 EDV cm/s 63 Antegrade  +---------+--------+---+--------+--+---------+  Left Carotid Findings: +----------+--------+--------+--------+-----------------------+--------+             PSV cm/s EDV cm/s Stenosis Plaque Description      Comments  +----------+--------+--------+--------+-----------------------+--------+  CCA Prox   90       22  smooth and heterogenous           +----------+--------+--------+--------+-----------------------+--------+  CCA Distal 49       20                smooth and heterogenous           +----------+--------+--------+--------+-----------------------+--------+  ICA Prox   121      50       40-59%   calcific                tortuous  +----------+--------+--------+--------+-----------------------+--------+  ICA Mid    114      49       40-59%                                     +----------+--------+--------+--------+-----------------------+--------+  ICA Distal 68       35                                        tortuous  +----------+--------+--------+--------+-----------------------+--------+  ECA        70       5                                                   +----------+--------+--------+--------+-----------------------+--------+ +----------+--------+--------+--------+-------------------+             PSV cm/s EDV cm/s Describe Arm Pressure (mmHG)   +----------+--------+--------+--------+-------------------+  Subclavian 144                                             +----------+--------+--------+--------+-------------------+ +---------+--------+--+--------+--+---------+  Vertebral PSV cm/s 36 EDV cm/s 16 Antegrade  +---------+--------+--+--------+--+---------+   Summary: Right 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 Left Carotid: Velocities in the left ICA are consistent with a 40-59% stenosis. Vertebrals: Bilateral vertebral arteries demonstrate antegrade flow. *See table(s) above for measurements and observations.  Electronically signed by Jamelle Haring on 06/01/2021 at 3:21:01 PM.    Final    Structural Heart Procedure  Result Date: 06/07/2021 See surgical note for result.  CT Angio Abd/Pel w/ and/or w/o  Result Date: 06/01/2021 CLINICAL DATA:  Inpatient. 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 Aortic stenosis. EXAM: CT ANGIOGRAPHY CHEST, ABDOMEN AND PELVIS TECHNIQUE: Multidetector CT imaging through the chest, abdomen and pelvis was performed using the standard protocol during bolus administration of intravenous contrast. Multiplanar reconstructed images and MIPs were obtained and reviewed to evaluate the vascular anatomy. CONTRAST:  151mL OMNIPAQUE IOHEXOL 350 MG/ML SOLN COMPARISON:  06/26/2017 CT angiogram of the chest, abdomen and pelvis. 05/12/2018 unenhanced CT abdomen/pelvis. 10/25/2018 MRI abdomen. FINDINGS: CTA CHEST FINDINGS Cardiovascular: Borderline mild cardiomegaly. No significant pericardial effusion/thickening. Diffuse thickening and coarse calcification of the aortic valve. Three-vessel coronary atherosclerosis. Atherosclerotic nonaneurysmal thoracic aorta. Top-normal caliber main pulmonary artery (3.1 cm diameter). No central pulmonary emboli. Mediastinum/Nodes: No discrete thyroid nodules. Unremarkable esophagus. No pathologically enlarged axillary, mediastinal or hilar lymph nodes.  Lungs/Pleura: No pneumothorax. Small dependent bilateral pleural effusions. Severe centrilobular emphysema with diffuse bronchial wall thickening. Saber sheath trachea. Right upper lobe 0.3  cm solid pulmonary nodule (series 8/image 64), stable since 06/26/2017 chest CT. Mild-to-moderate dependent bilateral lower lobe atelectasis. No lung masses or additional significant pulmonary nodules in the aerated portions of the lungs. Musculoskeletal: No aggressive appearing focal osseous lesions. Mild thoracic spondylosis. Marked degenerative disc disease throughout the cervical spine. CTA ABDOMEN AND PELVIS FINDINGS Hepatobiliary: Normal liver size. Several scattered subcentimeter simple liver cysts throughout the liver, not appreciably changed from 10/25/2018 MRI abdomen. No appreciable new liver lesions. Cholelithiasis. No biliary ductal dilatation. Pancreas: Normal, with no mass or duct dilation. Spleen: Normal size. No mass. Adrenals/Urinary Tract: Normal right adrenal. Left adrenal 2.3 cm nodule with density 44 HU, stable since at least 05/12/2018 unenhanced CT abdomen study, compatible with an adenoma. Hypodense exophytic 1.6 cm anterior interpolar left renal cortical lesion (series 7/image 150), indeterminate, mildly increased from 1.2 cm on 10/25/2018 MRI, not well evaluated on prior MRI due to artifact. Simple 2.2 cm anterior lower left renal cortical cyst. Additional scattered subcentimeter hypodense bilateral renal cortical lesions are too small to characterize. No hydronephrosis. Chronic mild diffuse bladder wall thickening and trabeculation, similar. Small superior bladder wall diverticula. Stomach/Bowel: Normal non-distended stomach. Postsurgical changes from prior small bowel resection with enteroenterostomy in the mid abdomen. No unexpected small bowel dilatation. No small bowel wall thickening. Marked diffuse colonic diverticulosis, most prominent in the sigmoid colon, with no acute large bowel wall  thickening or pericolonic fat stranding. 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 Ectatic 2.8 cm diameter right common iliac artery, increased from 2.4 cm on 06/26/2017 CT. No pathologically enlarged lymph nodes in the abdomen or pelvis. Reproductive: Top-normal size prostate with postsurgical changes from TURP in the right prostate. Nonspecific coarse internal prostatic calcifications. Other: No pneumoperitoneum, ascites or focal fluid collection. Musculoskeletal: No aggressive appearing focal osseous lesions. Bilateral posterior spinal fusion hardware L2-L5. 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 VASCULAR MEASUREMENTS PERTINENT TO TAVR: AORTA: Minimal Aortic Diameter-18.1 x 17.1 mm Severity of Aortic Calcification-severe RIGHT PELVIS: Right Common Iliac Artery - Minimal Diameter-11.8 x 10.9 mm Tortuosity-mild Calcification-moderate (predominantly stented) Right External Iliac Artery - Minimal Diameter-7.4 x 6.2 mm Tortuosity-severe Calcification-severe Right Common Femoral Artery - Minimal Diameter-9.1 x 8.5 mm Tortuosity-mild Calcification-severe LEFT PELVIS: Left Common Iliac Artery - Minimal Diameter-9.7 x 9.1 mm Tortuosity-mild-to-moderate Calcification-severe (entirely stented) Left External Iliac Artery - Minimal Diameter-7.2 x 6.6 mm Tortuosity-mild Calcification-severe (stented proximally) Left Common Femoral Artery - Minimal Diameter-9.2 x 7.8 mm Tortuosity-mild Calcification-severe Review of the MIP images confirms the above findings. IMPRESSION: 1. Vascular findings and measurements pertinent to potential TAVR procedure, as detailed. 2. Diffuse thickening and coarse calcification of the aortic valve, compatible with reported aortic stenosis. 3. Borderline mild cardiomegaly. Three-vessel coronary atherosclerosis. 4. Small dependent bilateral pleural  effusions. 5. Severe centrilobular emphysema with diffuse bronchial wall thickening and saber sheath trachea, suggesting COPD. 6. Hypodense exophytic 1.6 cm anterior interpolar left renal cortical lesion, indeterminate for renal neoplasm, mildly increased from 1.2 cm on 10/25/2018 MRI, not well evaluated on prior MRI due to artifact. Suggest attention on follow-up renal mass protocol CT abdomen without and with IV contrast in 3-6 months. 7. Cholelithiasis. 8. Marked diffuse colonic diverticulosis. 9. Stable left adrenal adenoma. 10. 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 11. Aortic Atherosclerosis (ICD10-I70.0) and Emphysema (ICD10-J43.9). Electronically Signed   By: Ilona Sorrel  M.D.   On: 06/01/2021 14:14    Microbiology: Recent Results (from the past 240 hour(s))  Surgical pcr screen     Status: None   Collection Time: 06/06/21 11:41 PM   Specimen: Nasal Mucosa; Nasal Swab  Result Value Ref Range Status   MRSA, PCR NEGATIVE NEGATIVE Final   Staphylococcus aureus NEGATIVE NEGATIVE Final    Comment: (NOTE) The Xpert SA Assay (FDA approved for NASAL specimens in patients 30 years of age and older), is one component of a comprehensive surveillance program. It is not intended to diagnose infection nor to guide or monitor treatment. Performed at [facility], [place] [address], [place], [place] [address]      Labs: Basic Metabolic Panel: Recent Labs  Lab 06/06/21 2028 06/07/21 0341 06/07/21 0807 06/07/21 1041 06/08/21 0504 06/09/21 0331 06/10/21 0428  NA 136 137 137 138 137 135 138  K 4.6 4.1 4.4 4.3 4.3 4.6 4.3  CL 95* 95* 96* 96* 97* 99 97*  CO2 33* 37*  --   --  32 31 35*  GLUCOSE 118* 104* 113* 132* 116* 96 95  BUN 24* 22 22 21 22  25* 19  CREATININE 0.86 0.80 0.70 0.70 0.92 0.80 0.72  CALCIUM 8.7* 8.4*  --   --  8.4* 8.4* 8.6*  MG  --   --   --   --  2.1   --   --    Liver Function Tests: No results for input(s): AST, ALT, ALKPHOS, BILITOT, PROT, ALBUMIN in the last 168 hours. No results for input(s): LIPASE, AMYLASE in the last 168 hours. No results for input(s): AMMONIA in the last 168 hours. CBC: Recent Labs  Lab 06/06/21 2028 06/07/21 0807 06/07/21 1041 06/08/21 0504 06/09/21 0331 06/10/21 0428  WBC 6.6  --   --  7.1 6.8 6.2  HGB 14.8 14.6 14.6 13.6 12.9* 13.1  HCT 44.9 43.0 43.0 41.0 40.5 40.6  MCV 94.3  --   --  95.6 96.4 96.0  PLT 147*  --   --  150 144* 151   Cardiac Enzymes: No results for input(s): CKTOTAL, CKMB, CKMBINDEX, TROPONINI in the last 168 hours. BNP: BNP (last 3 results) Recent Labs    05/27/21 1420  BNP 1,348.7*    ProBNP (last 3 results) No results for input(s): PROBNP in the last 8760 hours.  CBG: Recent Labs  Lab 06/09/21 1107 06/09/21 1616 06/10/21 0405 06/10/21 0627 06/10/21 1105  GLUCAP 304* 98 103* 97 154*       Signed:  Florencia Reasons MD, PhD, FACP  [facility] 06/10/2021, 11:59 AM

## 2021-06-10 NOTE — TOC Transition Note (Signed)
Transition of Care (TOC) - CM/SW Discharge Note Marvetta Gibbons RN, BSN Transitions of Care Unit 4E- RN Case Manager See Treatment Team for direct phone #    Patient Details  Name: Kyle Lewis MRN: [id number] Date of Birth: 1946-01-01  Transition of Care [facility]) CM/SW Contact:  Dawayne Patricia, RN Phone Number: 06/10/2021, 10:04 AM   Clinical Narrative:    Pt stable to transition home today, PT has worked with pt again and recommendations remain for home w/ HH as safe transition plan. Bedside RN has been able to confirm with pt that his wife will not be admitted and will be released home from [facility] ED. Pt's friend is at the bedside this am and can provide transportation home.  Pt has portable 02 tank for transport, and [facility] has been updated on discharge for start of care.    Final next level of care: [place] Barriers to Discharge: Barriers Resolved   Patient Goals and CMS Choice Patient states their goals for this hospitalization and ongoing recovery are:: return home with wife CMS Medicare.gov Compare Post Acute Care list provided to:: Patient Choice offered to / list presented to : Patient  Discharge Placement               Home with [facility]        Discharge Plan and Services   Discharge Planning Services: CM Consult Post Acute Care Choice: Durable Medical Equipment, Home Health          DME Arranged: Oxygen DME Agency: [name] Date DME Agency Contacted: 06/08/21 Time DME Agency Contacted: [id number] Representative spoke with at DME Agency: Enzo [facility] Arranged: PT, Social Work [facility] Agency: [facility] [place] Date [place]: 06/08/21 Time [place]: 1220 Representative spoke with at [place]: [place] Interventions     Readmission Risk Interventions Readmission Risk Prevention Plan 06/10/2021  Transportation Screening Complete  PCP or Specialist Appt within 5-7 Days Complete   Home Care Screening Complete  Medication Review (RN CM) Complete  Some recent data might be hidden

## 2021-06-13 ENCOUNTER — Telehealth: Payer: Self-pay | Admitting: *Deleted

## 2021-06-13 NOTE — Telephone Encounter (Signed)
Transition Care Management Follow-up Telephone Call Date of discharge and from where: 06/10/2020  How have you been since you were released from the hospital? Not better Any questions or concerns? Yes Urinating all over himself. Weak and not able to get around.   Items Reviewed: Did the pt receive and understand the discharge instructions provided? Yes  Medications obtained and verified? Yes  Other? No  Any new allergies since your discharge? No  Dietary orders reviewed? Yes Do you have support at home? Yes  But wife is having medical issues also and was just released from [facility] from [place].   Home Care and Equipment/Supplies: Were home health services ordered? yes If so, what is the name of the agency? Not sure  Has the agency set up a time to come to the patient's home? yes Were any new equipment or medical supplies ordered?  No What is the name of the medical supply agency? na Were you able to get the supplies/equipment? not applicable Do you have any questions related to the use of the equipment or supplies? No  Functional Questionnaire: (I = Independent and D = Dependent) ADLs: D  Bathing/Dressing- D  Meal Prep- D  Eating- I  Maintaining continence- D urinating on himself per wife  Transferring/Ambulation- I with assistance, weak  Managing Meds- D wife stated that patient was able to manage his own medications but since being home from hospital cannot.   Follow up appointments reviewed:  PCP Hospital f/u appt confirmed? Yes  Scheduled to see Dr. Sabra Heck on 06/14/21 @ 3. [place] f/u appt confirmed? No   Are transportation arrangements needed? No  If their condition worsens, is the pt aware to call PCP or go to the Emergency Dept.? Yes Was the patient provided with contact information for the PCP's office or ED? Yes Was to pt encouraged to call back with questions or concerns? Yes

## 2021-06-14 ENCOUNTER — Ambulatory Visit (INDEPENDENT_AMBULATORY_CARE_PROVIDER_SITE_OTHER): Payer: Medicare Other | Admitting: Family Medicine

## 2021-06-14 ENCOUNTER — Encounter: Payer: Self-pay | Admitting: Family Medicine

## 2021-06-14 ENCOUNTER — Other Ambulatory Visit: Payer: Self-pay

## 2021-06-14 VITALS — BP 138/84 | HR 60 | Temp 97.5°F | Ht 67.0 in | Wt 201.0 lb

## 2021-06-14 DIAGNOSIS — I5033 Acute on chronic diastolic (congestive) heart failure: Secondary | ICD-10-CM | POA: Diagnosis not present

## 2021-06-14 DIAGNOSIS — N3281 Overactive bladder: Secondary | ICD-10-CM

## 2021-06-14 DIAGNOSIS — R350 Frequency of micturition: Secondary | ICD-10-CM

## 2021-06-14 DIAGNOSIS — I350 Nonrheumatic aortic (valve) stenosis: Secondary | ICD-10-CM

## 2021-06-14 DIAGNOSIS — I10 Essential (primary) hypertension: Secondary | ICD-10-CM

## 2021-06-14 DIAGNOSIS — R399 Unspecified symptoms and signs involving the genitourinary system: Secondary | ICD-10-CM

## 2021-06-14 DIAGNOSIS — J449 Chronic obstructive pulmonary disease, unspecified: Secondary | ICD-10-CM

## 2021-06-14 LAB — POCT URINALYSIS DIPSTICK
Bilirubin, UA: NEGATIVE
Glucose, UA: NEGATIVE
Ketones, UA: NEGATIVE
Nitrite, UA: NEGATIVE
Protein, UA: POSITIVE — AB
Spec Grav, UA: 1.025 (ref 1.010–1.025)
Urobilinogen, UA: 0.2 E.U./dL
pH, UA: 6 (ref 5.0–8.0)

## 2021-06-14 NOTE — Progress Notes (Signed)
HEART AND [place]                                     Cardiology Office Note:    Date:  06/15/2021   ID:  Kyle Lewis, DOB Jun 03, 1946, MRN [id number]  PCP:  [name], MD  [facility] Cardiologist:  Jenean Lindau, MD / Dr. Angelena Form & Dr. Cyndia Bent (TAVR) [facility] Electrophysiologist:  None   Referring MD: [name], MD   [facility] s/p TAVR  History of Present Illness:    Kyle Lewis is a 76 y.o. male with a hx of medically managed CAD, AAA s/p repair 2016, carotid artery disease with known RICA occlusion and 42-59% LICA disease, chronic wounds, OSA on CPAP, hx of CVA/TIA, COPD on home 02, ongoing tobacco use, HTN and severe AS s/p TAVR (06/07/21) who presents to clinic for follow up.   He was recently admitted for acute hypoxic respiratory failure which was multifactorial due to underlying COPD, aortic stenosis, and diastolic heart failure requiring IV diruesis. He was diagnosed with severe AS. [facility] 12/27 showed moderate CAD to be managed medically. He underwent successful TAVR with a 29 mm Edwards Sapien 3 THV via the TF approach on 06/07/21. Post op echo showed EF 60%, normally functioning TAVR with a mean gradient of 16.5 mmHg and no PVL as well as mild MR. He was discharged on aspirin alone. He was discharged on home 02. He was discharged with home health but SNF was discussed.  He was seen by PCP yesterday and reported worsening weakness and there was discussion about admission to SNF. Labs were ordered showed creat 0.79 and normal CBC and UA without UTI.   Today the patient presents to clinic for follow up. Her with wife today. He is doing much better. Stopping Lasix has really helped issues surrounding urinary incontinence which was very disruptive for he and his wife. No CP or SOB. No LE edema, orthopnea or PND. No dizziness or syncope. No blood in stool or urine. No palpitations. He is working [facility] PT and doesn't  think he needs SNF placement any longer.    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   Abscess of left leg    Acute kidney injury superimposed on chronic kidney disease ([place]) 01/10/2018   Adenomatous colon polyp    Adrenal mass (HCC)    2 benign appearing left adrenal adenomas noted on 01/13/15 CT   Allergy    seasonal   Aortic stenosis    mild to moderate by echo and cath in 09/2016   Arthritis    Carotid artery occlusion    Cataract    Chronic back pain    COPD (chronic obstructive pulmonary disease) (HCC)    Coronary artery disease    CVA (cerebral infarction)    Degeneration of cervical intervertebral disc    Diverticulosis    History of back surgery    Rods and Screws in back   History of left knee replacement    Hypertension    Meningioma (HCC)    Nocturia    Peripheral vascular disease ([place])    Ringing in ear    ([place])   Ruptured abdominal aortic aneurysm (AAA) 07/01/2015   Formatting of this note might be different  from the original. Overview:  Last evaluation by ultrasounds with diameter of 5 cm follow-up by Dr. Oneida Alar last seen in June 10, 2015 Formatting of this note might be different from the original. Last evaluation by ultrasounds with diameter of 5 cm follow-up by Dr. 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 Angelena Form & Dr. Cyndia Bent   Sleep apnea    wears BIPAP set on 5 to 9   Stroke [facility]) 2013   tia no residual deficit from    Past Surgical History:  Procedure Laterality Date   ABDOMINAL AORTIC ANEURYSM REPAIR  2019   [place]  as child   BACK SURGERY  2018   Rods and Screws in Back lower back   BRAIN MENINGIOMA [place]     left   EYE SURGERY Bilateral    Cataract   IRRIGATION AND DEBRIDEMENT  ABSCESS Left 08/25/2020   Procedure: IRRIGATION AND DEBRIDEMENT ABSCESS LEFT LEG;  Surgeon: Evelina Bucy, DPM;  Location: [place];  Service: Podiatry;  Laterality: Left;   JOINT REPLACEMENT Left 04-01-12   Knee   LEFT HEART CATH AND CORONARY ANGIOGRAPHY N/A 09/15/2016   Procedure: Left Heart Cath and Coronary Angiography;  Surgeon: Nelva Bush, MD;  Location: [place] CV LAB;  Service: Cardiovascular;  Laterality: N/A;   MAXIMUM ACCESS (MAS)POSTERIOR LUMBAR INTERBODY FUSION (PLIF) 1 LEVEL N/A 07/07/2015   Procedure:  POSTERIOR LUMBAR INTERBODY FUSION (PLIF) Lumbar Four-Five with Pedicle Screw Fixation Lumbar Two-Five;Laminectomy Lumbar Two-Five;  Surgeon: Eustace Moore, MD;  Location: [place] NEURO ORS;  Service: Neurosurgery;  Laterality: N/A;   POSTERIOR LUMBAR INTERBODY FUSION (PLIF) Lumbar Four-Five with Pedicle Screw Fixation Lumbar Two-Five;Laminectomy Lumbar Two-Five   RIGHT/LEFT HEART CATH AND CORONARY ANGIOGRAPHY N/A 01/24/2018   Procedure: RIGHT/LEFT HEART CATH AND CORONARY ANGIOGRAPHY;  Surgeon: Nelva Bush, MD;  Location: [place] CV LAB;  Service: Cardiovascular;  Laterality: N/A;   RIGHT/LEFT HEART CATH AND CORONARY ANGIOGRAPHY N/A 05/31/2021   Procedure: RIGHT/LEFT HEART CATH AND CORONARY ANGIOGRAPHY;  Surgeon: Burnell Blanks, MD;  Location: [place] CV LAB;  Service: Cardiovascular;  Laterality: N/A;   TEE WITHOUT CARDIOVERSION N/A 06/07/2021   Procedure: TRANSESOPHAGEAL ECHOCARDIOGRAM (TEE);  Surgeon: Burnell Blanks, MD;  Location: [place];  Service: Open Heart Surgery;  Laterality: N/A;   TONSILLECTOMY  as child   TOTAL KNEE ARTHROPLASTY  04/01/2012   Procedure: TOTAL KNEE ARTHROPLASTY;  Surgeon: Gearlean Alf, MD;  Location: [facility];  Service: Orthopedics;  Laterality: Left;   TRANSCATHETER AORTIC VALVE REPLACEMENT, TRANSFEMORAL Bilateral 06/07/2021   Procedure: TRANSCATHETER AORTIC VALVE REPLACEMENT, RIGHT TRANSFEMORAL;  Surgeon:  Burnell Blanks, MD;  Location: [place];  Service: Open Heart Surgery;  Laterality: Bilateral;   TRANSURETHRAL RESECTION OF PROSTATE     ULTRASOUND GUIDANCE FOR VASCULAR ACCESS Bilateral 06/07/2021   Procedure: ULTRASOUND GUIDANCE FOR VASCULAR ACCESS;  Surgeon: Burnell Blanks, MD;  Location: [place];  Service: Open Heart Surgery;  Laterality: Bilateral;   UMBILICAL HERNIA REPAIR  2011    Current Medications: Current Meds  Medication Sig   acetaminophen (TYLENOL) 650 MG CR tablet Take 1,300 mg by mouth at bedtime.   albuterol (VENTOLIN HFA) 108 (90 Base) MCG/ACT inhaler Inhale 2 puffs into the lungs every 6 (six) hours as needed  for wheezing or shortness of breath.   amoxicillin (AMOXIL) 500 MG tablet Take 4 tablets (2,000 mg total) by mouth as directed. 1 HOUR PRIOR TO DENTAL PROCEDURES   aspirin 81 MG EC tablet Take 1 tablet (81 mg total) by mouth daily. Swallow whole.   atorvastatin (LIPITOR) 40 MG tablet Take 1 tablet (40 mg total) by mouth daily.   Cholecalciferol (D-3-5) 125 MCG (5000 UT) capsule Take 5,000 Units by mouth every morning.   finasteride (PROSCAR) 5 MG tablet Take 1 tablet (5 mg total) by mouth daily. (Patient taking differently: Take 5 mg by mouth at bedtime.)   fluticasone furoate-vilanterol (BREO ELLIPTA) 200-25 MCG/ACT AEPB Inhale 1 puff into the lungs daily.   gabapentin (NEURONTIN) 300 MG capsule Take 1 capsule (300 mg total) by mouth 2 (two) times daily. (Patient taking differently: Take 300 mg by mouth at bedtime.)   guaiFENesin 200 MG tablet Take 2 tablets (400 mg total) by mouth 2 (two) times daily.   loperamide (IMODIUM) 2 MG capsule Take 2 mg by mouth daily as needed for diarrhea or loose stools.   methocarbamol (ROBAXIN) 500 MG tablet Take 1 tablet (500 mg total) by mouth at bedtime as needed for muscle spasms. (Patient taking differently: Take 500 mg by mouth at bedtime.)   metoprolol tartrate (LOPRESSOR) 25 MG tablet Take 1/2 tablet (12.5 mg total) by  mouth 2 (two) times daily.   mirabegron ER (MYRBETRIQ) 25 MG TB24 tablet Take 25 mg by mouth every morning.   nicotine (NICODERM CQ - DOSED IN MG/24 HOURS) 21 mg/24hr patch Place 1 patch (21 mg total) onto the skin daily as needed.   nicotine (NICODERM CQ - DOSED IN MG/24 HR) 7 mg/24hr patch Place 1 patch (7 mg total) onto the skin daily as needed.   nicotine (NICOTINE STEP 2) 14 mg/24hr patch Place 1 patch (14 mg total) onto the skin daily as needed.   olmesartan (BENICAR) 40 MG tablet Take 1 tablet (40 mg total) by mouth daily. Please hold this medication, please discuss with your cardiology when should you restart this medication,   PRESCRIPTION MEDICATION Inhale into the lungs at bedtime. BIPAP   saccharomyces boulardii (FLORASTOR) 250 MG capsule Take 1 capsule (250 mg total) by mouth 2 (two) times daily for 9 days.   sertraline (ZOLOFT) 50 MG tablet Take 1 tablet (50 mg total) by mouth daily.   tamsulosin (FLOMAX) 0.4 MG CAPS capsule Take 1 capsule (0.4 mg total) by mouth daily after supper.   vitamin B-12 (CYANOCOBALAMIN) 1000 MCG tablet Take 1,000 mcg by mouth every morning.   [DISCONTINUED] furosemide (LASIX) 40 MG tablet Take 1 tablet (40 mg total) by mouth daily.     Allergies:   Cefazolin   Social History   Socioeconomic History   Marital status: Married    Spouse name: [name]    Number of children: 2   Years of education: College    Highest education level: Not on file  Occupational History    Comment: Self employed Administrator retired  Tobacco Use   Smoking status: Former    Packs/day: 0.75    Years: 40.00    Pack years: 30.00    Types: Cigarettes    Quit date: 07/20/2019    Years since quitting: 1.9   Smokeless tobacco: Never   Tobacco comments:    1/2 pack a day if that   Vaping Use   Vaping Use: Never used  Substance and Sexual Activity   Alcohol use:  Yes    Comment: rare   Drug use: No   Sexual activity: Not Currently  Other Topics Concern   Not on file   Social History Narrative   Patient lives at home with his wife. Rhonda    Patient has a Financial risk analyst.    Patient has 2 sons.    Patient smokes a half pack a day.          Social Determinants of Health   Financial Resource Strain: Not on file  Food Insecurity: Not on file  Transportation Needs: Not on file  Physical Activity: Not on file  Stress: Not on file  Social Connections: Not on file     Family History: The patient's family history includes Arthritis in his father and mother; Dementia (age of onset: 58) in his father; Diabetes in his father; Heart attack in his father; Heart disease in his father and mother; Hyperlipidemia in his mother; Hypertension in his mother; Prostate cancer in his maternal grandfather. There is no history of Colon cancer, Esophageal cancer, Rectal cancer, or Stomach cancer.  ROS:   Please see the history of present illness.    All other systems reviewed and are negative.  EKGs/Labs/Other Studies Reviewed:    The following studies were reviewed today:  05/31/21 RIGHT/LEFT HEART CATH AND CORONARY ANGIOGRAPHY   Conclusion      Prox LAD lesion is 25% stenosed.   Dist Cx lesion is 40% stenosed.   Ost 2nd Mrg to 2nd Mrg lesion is 70% stenosed.   Dist RCA lesion is 60% stenosed.   Ost RPDA to RPDA lesion is 40% stenosed.   The LAD is a large caliber vessel that courses to the apex. There is mild mid LAD stenosis The Circumflex is a large caliber vessel that gives off a small obtuse marginal branch then 2 moderate caliber obtuse marginal branches. The second obtuse marginal branch has a moderate stenosis, unchanged from last cath in 2019.  The RCA is a large dominant artery. The distal RCA has a moderate stenosis, unchanged from last cath in 2019.  Severe aortic stenosis (mean gradient 50.6 mmHg, 59 mmHg, AVA 0.93 cm2).    Recommendations: Will continue workup for TAVR. His right and left heart pressures are only mildly abnormal following  inpatient diuresis. Continue diuresis. The structural heart team will provide a formal consultation regarding his candidacy for TAVR.    ______________________   TAVR OPERATIVE NOTE     Date of Procedure:                06/07/2021   Preoperative Diagnosis:      Severe Aortic Stenosis    Postoperative Diagnosis:    Same    Procedure:        Transcatheter Aortic Valve Replacement - Percutaneous Right Transfemoral Approach             Edwards Sapien 3 THV (size 29 mm, model # 9600TFX, serial # [id number])              Co-Surgeons:                        Gaye Pollack, MD and Lauree Chandler, MD     Anesthesiologist:                  Jenita Seashore, MD   Echocardiographer:              Edmonia James, MD   Pre-operative Echo  Findings: Severe aortic stenosis Normal left ventricular systolic function   Post-operative Echo Findings: No paravalvular leak Normal left ventricular systolic function     __________________    Echo 06/08/21: IMPRESSIONS  1. The aortic valve has been repaired/replaced. There is a 29 mm Edwards  Ultra, stented (TAVR) valve present in the aortic position. Echo findings  are consistent with normal structure and function of the aortic valve  prosthesis.      Mean gradient 70mmHg, DI 0.5. No paravalvular leak.   2. Left ventricular ejection fraction, by estimation, is 60 to 65%. The  left ventricle has normal function. The left ventricle has no regional  wall motion abnormalities. There is severe concentric left ventricular  hypertrophy. Indeterminate diastolic  filling due to E-A fusion.   3. Right ventricular systolic function is normal. The right ventricular  size is normal. There is normal pulmonary artery systolic pressure. The  estimated right ventricular systolic pressure is 97.9 mmHg.   4. Left atrial size was mildly dilated.   5. The mitral valve is degenerative. There is moderate mitral annular  calcification. Trivial-to-mild mitral valve  regurgitation.   6. The inferior vena cava is dilated in size with >50% respiratory  variability, suggesting right atrial pressure of 8 mmHg.   Comparison(s): Compared to prior TTE on 05/28/21, the patient is now s/p  TAVR with normal functioning prosthesis on doppler interrogation. Mitral  regurgitation now appears improved to trivial-to-mild (previously  moderate).   EKG:  EKG is ordered today.  The ekg ordered today demonstrates sinus brady 58 bpm  Recent Labs: 05/27/2021: B Natriuretic Peptide 1,348.7 05/28/2021: ALT 29 06/08/2021: Magnesium 2.1 06/14/2021: BUN 25; Creat 0.79; Hemoglobin 15.3; Platelets 208; Potassium 4.5; Sodium 142  Recent Lipid Panel    Component Value Date/Time   CHOL 159 05/31/2021 0419   CHOL 144 02/01/2018 1101   TRIG 64 05/31/2021 0419   HDL 58 05/31/2021 0419   HDL 48 02/01/2018 1101   CHOLHDL 2.7 05/31/2021 0419   VLDL 13 05/31/2021 0419   LDLCALC 88 05/31/2021 0419   LDLCALC 95 04/08/2020 1506     Risk Assessment/Calculations:       Physical Exam:    VS:  BP 116/62    Pulse (!) 58    Ht 5\' 7"  (1.702 m)    Wt 202 lb 9.6 oz (91.9 kg)    SpO2 95%    BMI 31.73 kg/m     Wt Readings from Last 3 Encounters:  06/15/21 202 lb 9.6 oz (91.9 kg)  06/14/21 201 lb (91.2 kg)  06/10/21 214 lb 15.2 oz (97.5 kg)     GEN:  Well nourished, well developed in no acute distress, overweight  HEENT: Normal NECK: No JVD LYMPHATICS: No lymphadenopathy CARDIAC: RRR, no murmurs, rubs, gallops RESPIRATORY:  Clear to auscultation without rales, wheezing or rhonchi  ABDOMEN: Soft, non-tender, non-distended MUSCULOSKELETAL:  No edema; No deformity  SKIN: Warm and dry NEUROLOGIC:  Alert and oriented x 3 PSYCHIATRIC:  Normal affect   ASSESSMENT:    1. S/P TAVR (transcatheter aortic valve replacement)   2. Chronic diastolic CHF (congestive heart failure) ([place])   3. Coronary artery disease involving native heart without angina pectoris, unspecified vessel or  lesion type   4. Chronic obstructive pulmonary disease, unspecified COPD type ([place])   5. Tobacco abuse   6. Chronic respiratory failure with hypoxia (HCC)   7. Renal lesion   8. Depression, unspecified depression type    PLAN:  In order of problems listed above:  Severe AS s/p TAVR: doing well 1 week out from TAVR. Groin sites are healing well. ECG with no HAVB. SBE prophylaxis discussed; I have RX'd amoxicillin. Continue on a baby aspirin. I will see him back for 1 month follow up and echo.   Chronic diastolic CHF: Bmet ordered by PCP 1/10 and showed normal renal function. He was having a lot of accidents while on lasix and wife discontinued it which has been very helpful. He does not have signs of volume overload so I have switched lasix to as needed. Counseled on salt restriction and daily weights.    CAD: pre TAVR cath showed stable moderate non obstructive CAD. Continued medical therapy.   COPD/Tobacco abuse/Chronic hypoxic respiratory failure: has self discontinued home 02 with stable 02 sats and 02 sat 95% on RA today. He has abstained from smoking and interested in nicotine patches. We spent at least 5 minutes discussing the importance of complete tobacco cessation.    Renal lesion: pre TAVR CT showed a hypodense exophytic 1.6 cm anterior interpolar left renal cortical lesion, indeterminate for renal neoplasm, mildly increased from 1.2 cm on 10/25/2018 MRI, not well evaluated on prior MRI due to artifact. Suggest attention on follow-up renal mass protocol CT abdomen without and with IV contrast in 3-6 months. This was not discussed today.   Depression: patient and wife request that he be started on an antidepressant. Wife says Zoloft 50mg  daily works well for her and they request that. We discussed how Wellbutrin could be helpful with tobacco cravings but they declined this due to a relative that had a bad experience with this. I will start Zoloft today but will defer any medication  titration or changes to his PCP, who they have a good relationship with.       Medication Adjustments/Labs and Tests Ordered: Current medicines are reviewed at length with the patient today.  Concerns regarding medicines are outlined above.  Orders Placed This Encounter  Procedures   EKG 12-Lead   Meds ordered this encounter  Medications   nicotine (NICODERM CQ - DOSED IN MG/24 HR) 7 mg/24hr patch    Sig: Place 1 patch (7 mg total) onto the skin daily as needed.    Dispense:  28 patch    Refill:  0   nicotine (NICODERM CQ - DOSED IN MG/24 HOURS) 21 mg/24hr patch    Sig: Place 1 patch (21 mg total) onto the skin daily as needed.    Dispense:  28 patch    Refill:  0   nicotine (NICOTINE STEP 2) 14 mg/24hr patch    Sig: Place 1 patch (14 mg total) onto the skin daily as needed.    Dispense:  28 patch    Refill:  0   amoxicillin (AMOXIL) 500 MG tablet    Sig: Take 4 tablets (2,000 mg total) by mouth as directed. 1 HOUR PRIOR TO DENTAL PROCEDURES    Dispense:  8 tablet    Refill:  6   sertraline (ZOLOFT) 50 MG tablet    Sig: Take 1 tablet (50 mg total) by mouth daily.    Dispense:  90 tablet    Refill:  3   furosemide (LASIX) 40 MG tablet    Sig: Take 1 tablet (40 mg total) by mouth as needed.    Dispense:  90 tablet    Refill:  3    Patient Instructions  Medication Instructions:  Your physician has recommended you make  the following change in your medication:   TAKE LASIX AS NEEDED  START AMOXICILLIN 500 MG - TAKE 4 TABLETS 1 HOUR BEFORE ANY DENTAL PROCEDURES.  START ZOLOFT 50 MG DAILY.  START  NICOTINE PATCHES   *If you need a refill on your cardiac medications before your next appointment, please call your pharmacy*   Lab Work: NONE If you have labs (blood work) drawn today and your tests are completely normal, you will receive your results only by: [place] (if you have MyChart) OR A paper copy in the mail If you have any lab test that is abnormal or we  need to change your treatment, we will call you to review the results.   Testing/Procedures: NONE   Follow-Up: KEEP FOLLOW AS SCHEDULED   Signed, Angelena Form, PA-C  06/15/2021 2:41 PM    [facility]

## 2021-06-14 NOTE — Patient Instructions (Signed)
Continue as before I will contact hospitalist tomorrow regards admitting to skilled nursing for stregthening

## 2021-06-15 ENCOUNTER — Ambulatory Visit: Payer: Medicare Other | Admitting: Family Medicine

## 2021-06-15 ENCOUNTER — Encounter: Payer: Self-pay | Admitting: Physician Assistant

## 2021-06-15 ENCOUNTER — Ambulatory Visit (INDEPENDENT_AMBULATORY_CARE_PROVIDER_SITE_OTHER): Payer: Medicare Other | Admitting: Physician Assistant

## 2021-06-15 ENCOUNTER — Telehealth: Payer: Self-pay | Admitting: *Deleted

## 2021-06-15 VITALS — BP 116/62 | HR 58 | Ht 67.0 in | Wt 202.6 lb

## 2021-06-15 DIAGNOSIS — I5032 Chronic diastolic (congestive) heart failure: Secondary | ICD-10-CM | POA: Diagnosis not present

## 2021-06-15 DIAGNOSIS — F17210 Nicotine dependence, cigarettes, uncomplicated: Secondary | ICD-10-CM | POA: Diagnosis not present

## 2021-06-15 DIAGNOSIS — I2510 Atherosclerotic heart disease of native coronary artery without angina pectoris: Secondary | ICD-10-CM

## 2021-06-15 DIAGNOSIS — Z720 Tobacco use: Secondary | ICD-10-CM

## 2021-06-15 DIAGNOSIS — Z952 Presence of prosthetic heart valve: Secondary | ICD-10-CM

## 2021-06-15 DIAGNOSIS — N289 Disorder of kidney and ureter, unspecified: Secondary | ICD-10-CM

## 2021-06-15 DIAGNOSIS — F32A Depression, unspecified: Secondary | ICD-10-CM

## 2021-06-15 DIAGNOSIS — J9611 Chronic respiratory failure with hypoxia: Secondary | ICD-10-CM

## 2021-06-15 DIAGNOSIS — J449 Chronic obstructive pulmonary disease, unspecified: Secondary | ICD-10-CM | POA: Diagnosis not present

## 2021-06-15 LAB — CBC
HCT: 44.4 % (ref 38.5–50.0)
Hemoglobin: 15.3 g/dL (ref 13.2–17.1)
MCH: 31.7 pg (ref 27.0–33.0)
MCHC: 34.5 g/dL (ref 32.0–36.0)
MCV: 91.9 fL (ref 80.0–100.0)
MPV: 8.8 fL (ref 7.5–12.5)
Platelets: 208 10*3/uL (ref 140–400)
RBC: 4.83 10*6/uL (ref 4.20–5.80)
RDW: 13.3 % (ref 11.0–15.0)
WBC: 7.3 10*3/uL (ref 3.8–10.8)

## 2021-06-15 LAB — BASIC METABOLIC PANEL WITH GFR
BUN: 25 mg/dL (ref 7–25)
CO2: 31 mmol/L (ref 20–32)
Calcium: 9 mg/dL (ref 8.6–10.3)
Chloride: 103 mmol/L (ref 98–110)
Creat: 0.79 mg/dL (ref 0.70–1.28)
Glucose, Bld: 100 mg/dL — ABNORMAL HIGH (ref 65–99)
Potassium: 4.5 mmol/L (ref 3.5–5.3)
Sodium: 142 mmol/L (ref 135–146)
eGFR: 93 mL/min/{1.73_m2} (ref >=60)

## 2021-06-15 MED ORDER — NICOTINE 21 MG/24HR TD PT24: 21.0000 mg | MEDICATED_PATCH | Freq: Every day | TRANSDERMAL | 0 refills | Status: DC | PRN

## 2021-06-15 MED ORDER — NICOTINE 14 MG/24HR TD PT24: 14.0000 mg | MEDICATED_PATCH | Freq: Every day | TRANSDERMAL | 0 refills | Status: DC | PRN

## 2021-06-15 MED ORDER — FUROSEMIDE 40 MG PO TABS: 40.0000 mg | ORAL_TABLET | ORAL | 3 refills | Status: DC | PRN

## 2021-06-15 MED ORDER — NICOTINE 7 MG/24HR TD PT24
7.0000 mg | MEDICATED_PATCH | Freq: Every day | TRANSDERMAL | 0 refills | Status: DC | PRN
Start: 2021-06-15 — End: 2021-07-13

## 2021-06-15 MED ORDER — SERTRALINE HCL 50 MG PO TABS: 50.0000 mg | ORAL_TABLET | Freq: Every day | ORAL | 3 refills | Status: DC

## 2021-06-15 MED ORDER — AMOXICILLIN 500 MG PO TABS: 2000.0000 mg | ORAL_TABLET | ORAL | 6 refills | Status: DC

## 2021-06-15 NOTE — Patient Instructions (Signed)
Medication Instructions:  Your physician has recommended you make the following change in your medication:   TAKE LASIX AS NEEDED  START AMOXICILLIN 500 MG - TAKE 4 TABLETS 1 HOUR BEFORE ANY DENTAL PROCEDURES.  START ZOLOFT 50 MG DAILY.  START  NICOTINE PATCHES   *If you need a refill on your cardiac medications before your next appointment, please call your pharmacy*   Lab Work: NONE If you have labs (blood work) drawn today and your tests are completely normal, you will receive your results only by: [place] (if you have MyChart) OR A paper copy in the mail If you have any lab test that is abnormal or we need to change your treatment, we will call you to review the results.   Testing/Procedures: NONE   Follow-Up: KEEP FOLLOW AS SCHEDULED

## 2021-06-15 NOTE — Progress Notes (Signed)
Provider:  [name], MD  Careteam: Patient Care Team: [name], MD as PCP - General (Family Medicine) Revankar, Reita Cliche, MD as PCP - Cardiology (Cardiology) Druscilla Brownie, MD as Consulting Physician (Dermatology) Garvin Fila, MD as Consulting Physician (Neurology)  PLACE OF SERVICE:  [place]  Advanced Directive information    Allergies  Allergen Reactions   Cefazolin Rash and Other (See Comments)    The patient had surgery and was given cefazolin intraop. ~ 10 days later he developed a rash confirmed by biopsy to be consistent w/ drug eruption. We cannot know for sure, but this is the most likely agent.      Chief Complaint  Patient presents with   Hospitalization Follow-up    Patient was hospitalized at [facility] on 05/27/21-06/10/21 for acute respiratory failure with hypoxia and hypercapnia.     HPI: Patient is a 76 y.o. male this is a transition of care visit for this 76 year old man who was hospitalized at [facility] from [id number] for acute respiratory failure and replacement of aortic valve.  Her visit today was largely about his need for PT and regaining of strength and incontinence since the catheter was removed.  Wife is very upset that he was not transitioned to skilled nursing facility for physical therapy.  I tried to explain to her that it would be difficult to admit him to skilled care without going to hospital first and that readmission to the hospital just for purposes of getting an entrance to skilled care was not feasible.  She did not seem satisfied with this explanation.  She does have her own medical problems which make it difficult for her to care for her husband.  I think he is probably not very well motivated to get up and exercise on his own.  Review of Systems:  Review of Systems  Constitutional:  Positive for malaise/fatigue.  Respiratory: Negative.    Cardiovascular: Negative.  Negative for leg swelling.  Genitourinary:  Positive  for frequency.  Musculoskeletal: Negative.   Neurological:  Positive for weakness.  Psychiatric/Behavioral:  Positive for depression.   All other systems reviewed and are negative.  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   Abscess of left leg    Acute kidney injury superimposed on chronic kidney disease ([place]) 01/10/2018   Adenomatous colon polyp    Adrenal mass (HCC)    2 benign appearing left adrenal adenomas noted on 01/13/15 CT   Allergy    seasonal   Aortic stenosis    mild to moderate by echo and cath in 09/2016   Arthritis    Carotid artery occlusion    Cataract    Chronic back pain    COPD (chronic obstructive pulmonary disease) (HCC)    Coronary artery disease    CVA (cerebral infarction)    Degeneration of cervical intervertebral disc    Diverticulosis    History of back surgery    Rods and Screws in back   History of left knee replacement    Hypertension    Meningioma (HCC)    Nocturia    Peripheral vascular disease ([place])    Ringing in ear    ([place])   Ruptured abdominal aortic aneurysm (AAA) 07/01/2015   Formatting of this note might be different from the original. Overview:  Last evaluation by ultrasounds with diameter of 5 cm follow-up by Dr.  fields last seen in June 10, 2015 Formatting of this note might be different from the original. Last evaluation by ultrasounds with diameter of 5 cm follow-up by Dr. 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 Angelena Form & Dr. Cyndia Bent   Sleep apnea    wears BIPAP set on 5 to 9   Stroke [facility]) 2013   tia no residual deficit from   Past Surgical History:  Procedure Laterality Date   ABDOMINAL AORTIC ANEURYSM REPAIR  2019   [place]  as child   BACK SURGERY  2018   Rods and Screws in Back lower back   BRAIN  MENINGIOMA EXCISION  1991   menigioma   CARPAL TUNNEL RELEASE     left   EYE SURGERY Bilateral    Cataract   IRRIGATION AND DEBRIDEMENT ABSCESS Left 08/25/2020   Procedure: IRRIGATION AND DEBRIDEMENT ABSCESS LEFT LEG;  Surgeon: Evelina Bucy, DPM;  Location: [place];  Service: Podiatry;  Laterality: Left;   JOINT REPLACEMENT Left 04-01-12   Knee   LEFT HEART CATH AND CORONARY ANGIOGRAPHY N/A 09/15/2016   Procedure: Left Heart Cath and Coronary Angiography;  Surgeon: Nelva Bush, MD;  Location: [place] [facility];  Service: Cardiovascular;  Laterality: N/A;   MAXIMUM ACCESS (MAS)POSTERIOR LUMBAR INTERBODY FUSION (PLIF) 1 LEVEL N/A 07/07/2015   Procedure:  POSTERIOR LUMBAR INTERBODY FUSION (PLIF) Lumbar Four-Five with Pedicle Screw Fixation Lumbar Two-Five;Laminectomy Lumbar Two-Five;  Surgeon: Eustace Moore, MD;  Location: [place] [facility];  Service: Neurosurgery;  Laterality: N/A;   POSTERIOR LUMBAR INTERBODY FUSION (PLIF) Lumbar Four-Five with Pedicle Screw Fixation Lumbar Two-Five;Laminectomy Lumbar Two-Five   RIGHT/LEFT HEART CATH AND CORONARY ANGIOGRAPHY N/A 01/24/2018   Procedure: RIGHT/LEFT HEART CATH AND CORONARY ANGIOGRAPHY;  Surgeon: Nelva Bush, MD;  Location: [place] [facility];  Service: Cardiovascular;  Laterality: N/A;   RIGHT/LEFT HEART CATH AND CORONARY ANGIOGRAPHY N/A 05/31/2021   Procedure: RIGHT/LEFT HEART CATH AND CORONARY ANGIOGRAPHY;  Surgeon: Burnell Blanks, MD;  Location: [place] [facility];  Service: Cardiovascular;  Laterality: N/A;   TEE WITHOUT CARDIOVERSION N/A 06/07/2021   Procedure: TRANSESOPHAGEAL ECHOCARDIOGRAM (TEE);  Surgeon: Burnell Blanks, MD;  Location: [place];  Service: Open Heart Surgery;  Laterality: N/A;   TONSILLECTOMY  as child   TOTAL KNEE ARTHROPLASTY  04/01/2012   Procedure: TOTAL KNEE ARTHROPLASTY;  Surgeon: Gearlean Alf, MD;  Location: [facility];  Service: Orthopedics;  Laterality: Left;   TRANSCATHETER  AORTIC VALVE REPLACEMENT, TRANSFEMORAL Bilateral 06/07/2021   Procedure: TRANSCATHETER AORTIC VALVE REPLACEMENT, RIGHT TRANSFEMORAL;  Surgeon: Burnell Blanks, MD;  Location: [place];  Service: Open Heart Surgery;  Laterality: Bilateral;   TRANSURETHRAL RESECTION OF PROSTATE     ULTRASOUND GUIDANCE FOR VASCULAR ACCESS Bilateral 06/07/2021   Procedure: ULTRASOUND GUIDANCE FOR VASCULAR ACCESS;  Surgeon: Burnell Blanks, MD;  Location: [place];  Service: Open Heart Surgery;  Laterality: Bilateral;   UMBILICAL HERNIA REPAIR  2011   Social History:   reports that he quit smoking about 22 months ago. His smoking use included cigarettes. He has a 30.00 pack-year smoking history. He has never used smokeless tobacco. He reports current alcohol use. He reports that he does not use drugs.  Family History  Problem Relation Age of Onset   Heart disease Mother  Onset ~60 y/o   Hypertension Mother        Deceased from old age at 54   Hyperlipidemia Mother    Arthritis Mother    Diabetes Father        Deceased from old age at 27   Heart attack Father    Heart disease Father        CABG at age 38   Dementia Father 32   Arthritis Father    Prostate cancer Maternal Grandfather    Colon cancer Neg Hx    Esophageal cancer Neg Hx    Rectal cancer Neg Hx    Stomach cancer Neg Hx     Medications: Patient's Medications  New Prescriptions   No medications on file  Previous Medications   ACETAMINOPHEN (TYLENOL) 650 MG CR TABLET    Take 1,300 mg by mouth at bedtime.   ALBUTEROL (VENTOLIN HFA) 108 (90 BASE) MCG/ACT INHALER    Inhale 2 puffs into the lungs every 6 (six) hours as needed for wheezing or shortness of breath.   ASPIRIN 81 MG EC TABLET    Take 1 tablet (81 mg total) by mouth daily. Swallow whole.   ATORVASTATIN (LIPITOR) 40 MG TABLET    Take 1 tablet (40 mg total) by mouth daily.   CHOLECALCIFEROL (D-3-5) 125 MCG (5000 UT) CAPSULE    Take 5,000 Units by mouth every morning.    FINASTERIDE (PROSCAR) 5 MG TABLET    Take 1 tablet (5 mg total) by mouth daily.   FLUTICASONE FUROATE-VILANTEROL (BREO ELLIPTA) 200-25 MCG/ACT AEPB    Inhale 1 puff into the lungs daily.   FUROSEMIDE (LASIX) 40 MG TABLET    Take 1 tablet (40 mg total) by mouth daily.   GABAPENTIN (NEURONTIN) 300 MG CAPSULE    Take 1 capsule (300 mg total) by mouth 2 (two) times daily.   GUAIFENESIN 200 MG TABLET    Take 2 tablets (400 mg total) by mouth 2 (two) times daily.   LOPERAMIDE (IMODIUM) 2 MG CAPSULE    Take 2 mg by mouth daily as needed for diarrhea or loose stools.   METHOCARBAMOL (ROBAXIN) 500 MG TABLET    Take 1 tablet (500 mg total) by mouth at bedtime as needed for muscle spasms.   METOPROLOL TARTRATE (LOPRESSOR) 25 MG TABLET    Take 1/2 tablet (12.5 mg total) by mouth 2 (two) times daily.   MIRABEGRON ER (MYRBETRIQ) 25 MG TB24 TABLET    Take 25 mg by mouth every morning.   OLMESARTAN (BENICAR) 40 MG TABLET    Take 1 tablet (40 mg total) by mouth daily. Please hold this medication, please discuss with your cardiology when should you restart this medication,   PRESCRIPTION MEDICATION    Inhale into the lungs at bedtime. BIPAP   SACCHAROMYCES BOULARDII (FLORASTOR) 250 MG CAPSULE    Take 1 capsule (250 mg total) by mouth 2 (two) times daily for 9 days.   TAMSULOSIN (FLOMAX) 0.4 MG CAPS CAPSULE    Take 1 capsule (0.4 mg total) by mouth daily after supper.   VITAMIN B-12 (CYANOCOBALAMIN) 1000 MCG TABLET    Take 1,000 mcg by mouth every morning.  Modified Medications   No medications on file  Discontinued Medications   No medications on file    Physical Exam:  Vitals:   06/14/21 1500  BP: 138/84  Pulse: 60  Temp: (!) 97.5 F (36.4 C)  SpO2: 95%  Weight: 201 lb (91.2 kg)  Height: 5\' 7"  (  1.702 m)   Body mass index is 31.48 kg/m. Wt Readings from Last 3 Encounters:  06/14/21 201 lb (91.2 kg)  06/10/21 214 lb 15.2 oz (97.5 kg)  05/05/21 222 lb 9.6 oz (101 kg)    Physical Exam Vitals and  nursing note reviewed.  Constitutional:      Appearance: Normal appearance.  HENT:     Head: Normocephalic.  Cardiovascular:     Rate and Rhythm: Normal rate and regular rhythm.     Heart sounds: Murmur heard.  Pulmonary:     Effort: Pulmonary effort is normal.     Breath sounds: Normal breath sounds.  Musculoskeletal:        General: No swelling.  Neurological:     General: No focal deficit present.     Mental Status: He is alert and oriented to person, place, and time.  Psychiatric:        Mood and Affect: Mood normal.        Behavior: Behavior normal.    Labs reviewed: Basic Metabolic Panel: Recent Labs    05/29/21 0015 05/30/21 0705 05/31/21 0419 06/08/21 0504 06/09/21 0331 06/10/21 0428 06/14/21 1523  NA 143 141   < > 137 135 138 142  K 4.6 4.3   < > 4.3 4.6 4.3 4.5  CL 96* 91*   < > 97* 99 97* 103  CO2 42* 44*   < > 32 31 35* 31  GLUCOSE 127* 90   < > 116* 96 95 100*  BUN 22 22   < > 22 25* 19 25  CREATININE 1.16 0.73   < > 0.92 0.80 0.72 0.79  CALCIUM 8.2* 8.3*   < > 8.4* 8.4* 8.6* 9.0  MG 2.3 2.2  --  2.1  --   --   --    < > = values in this interval not displayed.   Liver Function Tests: Recent Labs    08/13/20 1348 05/27/21 1420 05/28/21 0026  AST 16 19 22   ALT 17 31 29   ALKPHOS 66 56 48  BILITOT 1.2 0.8 1.1  PROT 6.4* 6.2* 5.5*  ALBUMIN 3.8 3.7 3.3*   No results for input(s): LIPASE, AMYLASE in the last 8760 hours. No results for input(s): AMMONIA in the last 8760 hours. CBC: Recent Labs    05/20/21 1710 05/27/21 1420 05/28/21 0026 05/31/21 1320 06/09/21 0331 06/10/21 0428 06/14/21 1523  WBC 6.8 7.7 5.8   < > 6.8 6.2 7.3  NEUTROABS 5,413 6.1 5.3  --   --   --   --   HGB 16.3 16.6 15.7   < > 12.9* 13.1 15.3  HCT 49.3 52.8* 48.4   < > 40.5 40.6 44.4  MCV 95.0 101.5* 98.0   < > 96.4 96.0 91.9  PLT 213 186 167   < > 144* 151 208   < > = values in this interval not displayed.   Lipid Panel: Recent Labs    05/31/21 0419  CHOL 159   HDL 58  LDLCALC 88  TRIG 64  CHOLHDL 2.7   TSH: No results for input(s): TSH in the last 8760 hours. A1C: Lab Results  Component Value Date   HGBA1C 5.5 06/06/2021     Assessment/Plan  1. Primary hypertension Blood pressure is good today at 138/84.  He continues on olmesartan and metoprolol  2. Moderate aortic stenosis Aortic valve replaced through catheter technique  3. Urination frequency Apparently had Foley and cath and since Foley is removed  he has been incontinent this is a source of great problem for life  4. Acute on chronic diastolic heart failure ([place]) Patient appears well compensated after aortic valve replacement breathing is easy not on oxygen  5. COPD GOLD II He is followed with pulmonology and has appointment tomorrow for follow-up  6. Lower urinary tract symptoms (LUTS) Overactive bladder and frequency.  Wife has taken him off Lasix which might help some and I think having remove Foley cath as we get further away from that removal symptoms should resolve  7. OAB (overactive bladder) See above   [name], MD [place] [phone]

## 2021-06-15 NOTE — Telephone Encounter (Signed)
Dr. Sabra Heck came to me and asked me to call wife regarding placement into [facility]. Stated that he spoke with [facility] and she thinks she can get patient into the facility.   I called and spoke with Faythe Dingwall, wife and she stated that patient was doing much better today. Stated that she took him off of the Lasix and he didn't urinate at all until this morning.   I explained the importance of taking his medications unless directed by a Dr. And she stated that he is doing just fine and she is Observing him for swelling.   She stated that patient has an appointment with the Cardiologist today and she will discuss the Lasix with him.   Wife confirmed that she wants to HOLD OFF on facility placement at this time.

## 2021-06-18 LAB — URINE CULTURE
MICRO NUMBER:: 12851442
SPECIMEN QUALITY:: ADEQUATE

## 2021-06-21 ENCOUNTER — Other Ambulatory Visit: Payer: Self-pay | Admitting: Family Medicine

## 2021-06-21 ENCOUNTER — Telehealth (HOSPITAL_COMMUNITY): Payer: Self-pay

## 2021-06-21 DIAGNOSIS — I2510 Atherosclerotic heart disease of native coronary artery without angina pectoris: Secondary | ICD-10-CM

## 2021-06-21 DIAGNOSIS — I130 Hypertensive heart and chronic kidney disease with heart failure and stage 1 through stage 4 chronic kidney disease, or unspecified chronic kidney disease: Secondary | ICD-10-CM | POA: Diagnosis not present

## 2021-06-21 DIAGNOSIS — N390 Urinary tract infection, site not specified: Secondary | ICD-10-CM

## 2021-06-21 DIAGNOSIS — M1990 Unspecified osteoarthritis, unspecified site: Secondary | ICD-10-CM

## 2021-06-21 DIAGNOSIS — N189 Chronic kidney disease, unspecified: Secondary | ICD-10-CM | POA: Diagnosis not present

## 2021-06-21 DIAGNOSIS — I5032 Chronic diastolic (congestive) heart failure: Secondary | ICD-10-CM | POA: Diagnosis not present

## 2021-06-21 DIAGNOSIS — Z48812 Encounter for surgical aftercare following surgery on the circulatory system: Secondary | ICD-10-CM

## 2021-06-21 DIAGNOSIS — M5030 Other cervical disc degeneration, unspecified cervical region: Secondary | ICD-10-CM

## 2021-06-21 DIAGNOSIS — J449 Chronic obstructive pulmonary disease, unspecified: Secondary | ICD-10-CM | POA: Diagnosis not present

## 2021-06-21 MED ORDER — CIPROFLOXACIN HCL 500 MG PO TABS: 500.0000 mg | ORAL_TABLET | Freq: Two times a day (BID) | ORAL | 0 refills | Status: DC

## 2021-06-21 NOTE — Telephone Encounter (Signed)
Per phase I cardiac rehab, fax cardiac rehab referral to [facility]. °

## 2021-06-23 ENCOUNTER — Ambulatory Visit (INDEPENDENT_AMBULATORY_CARE_PROVIDER_SITE_OTHER): Payer: Medicare Other | Admitting: Internal Medicine

## 2021-06-23 ENCOUNTER — Other Ambulatory Visit: Payer: Self-pay

## 2021-06-23 ENCOUNTER — Ambulatory Visit: Payer: Medicare Other

## 2021-06-23 ENCOUNTER — Encounter: Payer: Self-pay | Admitting: Internal Medicine

## 2021-06-23 VITALS — BP 160/80 | HR 60 | Temp 97.8°F | Ht 68.0 in | Wt 208.6 lb

## 2021-06-23 DIAGNOSIS — I2510 Atherosclerotic heart disease of native coronary artery without angina pectoris: Secondary | ICD-10-CM | POA: Diagnosis not present

## 2021-06-23 DIAGNOSIS — F17200 Nicotine dependence, unspecified, uncomplicated: Secondary | ICD-10-CM

## 2021-06-23 DIAGNOSIS — J9602 Acute respiratory failure with hypercapnia: Secondary | ICD-10-CM

## 2021-06-23 DIAGNOSIS — J449 Chronic obstructive pulmonary disease, unspecified: Secondary | ICD-10-CM | POA: Diagnosis not present

## 2021-06-23 DIAGNOSIS — J9601 Acute respiratory failure with hypoxia: Secondary | ICD-10-CM

## 2021-06-23 NOTE — Progress Notes (Signed)
Kyle Lewis, male    DOB: Jul 05, 1945     MRN: [id number]   Brief patient profile:  76  yowm  quit smoking 05/2021 with onset insomnia "can't get to sleep" on side x 2015 > [facility] eval place on cpap > slept better but then changed to bipap and just wanted to change sleep doctors due to communication issues with Chodri, actually satisfied he's sleeping better with bipap and denies every having hypersomnolence or poorly controlled hypertension previous to dx.    History of Present Illness  09/05/2018  Pulmonary/ 1st office eval/[facility] [place]  Chief Complaint  Patient presents with   Consult    Consult for COPD. He has previously seen pulmonology but was not happy with their services. He to get established for sleep and COPD.   Dyspnea:  More limited by knee and back than breathing but still able to get MB flat and haul the trash to street Cough: not a problem  Sleep: fine as far  as he's aware / minimal daytime drowsiness / some p supper  SABA use: none  rec Sleep w/u per Annamaria Boots    04/07/2019  f/u ov/Inda Mcglothen re: COPD II/ reported quit smoking a week prior to [place] Complaint  Patient presents with   Follow-up    PFT's done today.   Dyspnea:  Limited by knees  >  Breathing  Cough: no  Sleeping: on bipap per [place]  SABA use: twice daily no benefit "does it that way cause was told to"  02: none  Rec Only use your albuterol as a rescue medication to be used if you can't catch your breath  Try using albuterol x 2 pffs x 15 min before exertion to see if makes a difference and call me back if this is the case for stronger longer acting version Call if you wish to entire the lung cancer screening program give [place] a call   Admit date: 05/27/2021 Discharge date: 06/10/2021  Recommendations for Outpatient Follow-up:  F/u with PCP within a week  for hospital discharge follow up, repeat cbc/bmp at follow up F/u with cardiology F/u with pulmonology Home health/home o2       Discharge  Diagnoses:       Active Hospital Problems    Diagnosis Date Noted   Acute respiratory failure with hypoxia and hypercapnia (HCC) 05/27/2021   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   Mitral regurgitation 06/02/2021   Acute on chronic diastolic heart failure (HCC)     COPD GOLD II 04/07/2019   Severe aortic stenosis 02/01/2018   CAD (coronary artery disease) 02/01/2018   Hyperlipidemia LDL goal <70 01/19/2018   History of right MCA stroke 07/21/2014   Tobacco use 11/08/2013   Exogenous obesity 11/08/2013   OSA (obstructive sleep apnea)     Hypertension       Resolved Hospital Problems    Diagnosis Date Noted Date Resolved   Acute CHF (congestive heart failure) ([place]) 05/27/2021 06/07/2021   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 06/07/2021      History of present illness: ( per admitting MD Dr Bridgett Larsson) 76 year old white male with a history of COPD, OSA, hypertension, prior history of AAA repair presents to the hospital today with worsening shortness of breath.  Patient been treated about 7 to 10 days ago with prednisone, antibiotics for presumed COPD exacerbation.  Patient 's wife called his PCP today due to patient having hypoxia at home with reported room air sats  of 58%.  911 was activated.   On arrival sats were 94% on 4 L.  Blood pressure 175/95.   Patient denies any recent fever or chills.  He has been short of breath for 7 to 10 days.  He has had an occasional cough.  Patient states that his wife noticed ankle swelling the last 2 to 3 days.  He does not weigh himself.  He has no prior history of congestive heart failure.   Lab evaluation BNP was elevated to 1348.  No prior other BNP's are available in EMR.  COVID-negative.  Flu negative.   Serum CO2 34, BUN 23, creatinine 0.7   CBC white count 7.7, hemoglobin 16.6, platelets 186.   Chest x-ray demonstrated mild cardiomegaly.  Some trace effusions.  No frank edema.   [facility]  hospitalist contacted for admission.    ED Course: BNP elevated. CXR without pulmonary edema. O2 sats 90% on 4 L   Hospital Course:  Principal Problem:   Acute respiratory failure with hypoxia and hypercapnia (HCC) Active Problems:   Hypertension   OSA (obstructive sleep apnea)   History of right MCA stroke   Exogenous obesity   Tobacco use   Hyperlipidemia LDL goal <70   CAD (coronary artery disease)   Severe aortic stenosis   COPD GOLD II   Acute on chronic diastolic heart failure (HCC)   Mitral regurgitation   S/P TAVR (transcatheter aortic valve replacement)     Acute hypoxic respiratory failure, not on home O2 prior to admission -Hypoxia thought due to underlying COPD, aortic stenosis and diastolic CHF exacerbation, detail please see CT angio chest on 12/28 -He has improved, no chest pain, no lower extremity edema on exam ,no short of breath at rest, but O2 dropped to low 80s on room air -home O2 arranged    Acute on chronic diastolic CHF With lower extremity edema and bilateral pleural effusion initially Received IV Lasix, edema has resolved, short of breath is improved Cardiology recommend discharge home on Lasix 40 daily, he will f/u with cardiology next weel   CAD  S/p cath on 12/27 showed mild LAD stenosis, moderate dLCx disease, moderate RCA disease and moderate ostial RPDA to RPDA disease with confirmed severe AS  Denies chest pain On asa, discontinue plavix per cardiology recommendation,  Discharged on statin   defer to cardiology   Severe aortic stenosis S/p TAVR on 1/3 by Dr Cyndia Bent   HTN On lasix only in the hospital, bp start to trend up, discharged on low dose lopressor F/u with cardiology next week   AAA s/p repair 2016   carotid artery disease with known RICA occlusion and 46-56% LICA disease from 8127,     COPD, was treated with steroid and antibiotics PTA, finished prednisone treatment on 12/27 currently no wheezing, no cough Smoking cessation  education He can not afford breo, he is given albuterol prn, he is advised to discuss with pulmonology Outpatient follow up with pulmonology scheduled   Bph, continue home meds Proscar and Flomax   OSA, continue home BiPAP    FTT, with poor memory, weakness, new oxygen requirement, patient decide to go home with home health        06/23/2021  hosp f/u ov/Angenette Daily re: GOLD 2 just quit smoking 05/2021  maint on no rx at all  Chief Complaint  Patient presents with   Follow-up    PHOS-sob better,occass. Wheeze,not using oxygen at home   Dyspnea:  just walking inside  Cough: none  Sleeping: on bipap s 02 / electric bed  10 degrees / doing fine on present setting SABA use: has it but not using /  02: not using  Covid status:   vax x 2    No obvious day to day or daytime variability or assoc excess/ purulent sputum or mucus plugs or hemoptysis or cp or chest tightness, subjective wheeze or overt sinus or hb symptoms.   Sleeping as abovde  without nocturnal  or early am exacerbation  of respiratory  c/o's or need for noct saba. Also denies any obvious fluctuation of symptoms with weather or environmental changes or other aggravating or alleviating factors except as outlined above   No unusual exposure hx or h/o childhood pna/ asthma or knowledge of premature birth.  Current Allergies, Complete Past Medical History, Past Surgical History, Family History, and Social History were reviewed in [employer] record.  ROS  The following are not active complaints unless bolded Hoarseness, sore throat, dysphagia, dental problems, itching, sneezing,  nasal congestion or discharge of excess mucus or purulent secretions, ear ache,   fever, chills, sweats, unintended wt loss or wt gain, classically pleuritic or exertional cp,  orthopnea pnd or arm/hand swelling  or leg swelling, presyncope, palpitations, abdominal pain, anorexia, nausea, vomiting, diarrhea  or change in bowel habits or  change in bladder habits, change in stools or change in urine, dysuria, hematuria,  rash, arthralgias, visual complaints, headache, numbness, weakness or ataxia or problems with walking or coordination,  change in mood or  memory.        Current Meds  Medication Sig   acetaminophen (TYLENOL) 650 MG CR tablet Take 1,300 mg by mouth at bedtime.   albuterol (VENTOLIN HFA) 108 (90 Base) MCG/ACT inhaler Inhale 2 puffs into the lungs every 6 (six) hours as needed for wheezing or shortness of breath.   amoxicillin (AMOXIL) 500 MG tablet Take 4 tablets (2,000 mg total) by mouth as directed. 1 HOUR PRIOR TO DENTAL PROCEDURES   aspirin 81 MG EC tablet Take 1 tablet (81 mg total) by mouth daily. Swallow whole.   atorvastatin (LIPITOR) 40 MG tablet Take 1 tablet (40 mg total) by mouth daily.   Cholecalciferol (D-3-5) 125 MCG (5000 UT) capsule Take 5,000 Units by mouth every morning.   ciprofloxacin (CIPRO) 500 MG tablet Take 1 tablet (500 mg total) by mouth 2 (two) times daily.   finasteride (PROSCAR) 5 MG tablet Take 1 tablet (5 mg total) by mouth daily. (Patient taking differently: Take 5 mg by mouth at bedtime.)   gabapentin (NEURONTIN) 300 MG capsule Take 1 capsule (300 mg total) by mouth 2 (two) times daily. (Patient taking differently: Take 300 mg by mouth at bedtime.)   loperamide (IMODIUM) 2 MG capsule Take 2 mg by mouth daily as needed for diarrhea or loose stools.   methocarbamol (ROBAXIN) 500 MG tablet Take 1 tablet (500 mg total) by mouth at bedtime as needed for muscle spasms. (Patient taking differently: Take 500 mg by mouth at bedtime.)   metoprolol tartrate (LOPRESSOR) 25 MG tablet Take 1/2 tablet (12.5 mg total) by mouth 2 (two) times daily.   mirabegron ER (MYRBETRIQ) 25 MG TB24 tablet Take 25 mg by mouth every morning.   nicotine (NICODERM CQ - DOSED IN MG/24 HOURS) 21 mg/24hr patch Place 1 patch (21 mg total) onto the skin daily as needed.   nicotine (NICODERM CQ - DOSED IN MG/24 HR) 7  mg/24hr patch Place 1 patch (7 mg total)  onto the skin daily as needed.   nicotine (NICOTINE STEP 2) 14 mg/24hr patch Place 1 patch (14 mg total) onto the skin daily as needed.   PRESCRIPTION MEDICATION Inhale into the lungs at bedtime. BIPAP   sertraline (ZOLOFT) 50 MG tablet Take 1 tablet (50 mg total) by mouth daily.   tamsulosin (FLOMAX) 0.4 MG CAPS capsule Take 1 capsule (0.4 mg total) by mouth daily after supper.   vitamin B-12 (CYANOCOBALAMIN) 1000 MCG tablet Take 1,000 mcg by mouth every morning.                   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   Adenomatous colon polyp    Adrenal mass (HCC)    2 benign appearing left adrenal adenomas noted on 01/13/15 CT   Allergy    seasonal   Aortic stenosis    mild to moderate by echo and cath in 09/2016   Arthritis    BPH (benign prostatic hyperplasia)    Carotid artery occlusion    Cataract    Chronic back pain    COPD (chronic obstructive pulmonary disease) (HCC)    Coronary artery disease    CVA (cerebral infarction)    Diverticulosis    Hypertension    Nocturia    Peripheral vascular disease ([place])    Sleep apnea    wears CPAP   Stroke ([place]) 2013   tia        Objective:       06/23/2021       208    04/07/19 223 lb (101.2 kg)  02/24/19 222 lb 12.8 oz (101.1 kg)  11/18/18 225 lb (102.1 kg)    Vital signs reviewed  06/23/2021  - Note at rest 02 sats  96% on RA   General appearance:    amb wm nad     HEENT : pt wearing mask not removed for exam due to covid - 19 concerns.    NECK :  without JVD/Nodes/TM/ nl carotid upstrokes bilaterally   LUNGS: no acc muscle use,  Mild barrel  contour chest wall with bilateral  Distant bs s audible wheeze and  without cough on insp or exp maneuvers  and mild  Hyperresonant  to  percussion bilaterally     CV:  RRR  no s3 or murmur or increase in P2, and no edema   ABD:  soft and  nontender with pos end  insp Hoover's  in the supine position. No bruits or organomegaly appreciated, bowel sounds nl  MS:   Nl gait/  ext warm without deformities, calf tenderness, cyanosis or clubbing No obvious joint restrictions   SKIN: warm and dry without lesions    NEURO:  alert, approp, nl sensorium with  no motor or cerebellar deficits apparent.         I personally reviewed images and agree with radiology impression as follows:  CXR:   06/06/21 pa and lateral  1. Low lung volumes with patchy atelectasis or infiltrate at the lung bases. 2. Small bilateral pleural effusions. 3. Aortic atherosclerosis.  Rec repeat cxr 06/23/2021 but he did not go as rec      Assessment

## 2021-06-23 NOTE — Patient Instructions (Signed)
We will walk you today to be sure your 02 level stays above 90% and we can stop the 02 if it does with walking    Make sure you check your oxygen saturation at your highest level of activity to be sure it stays over 90% and keep track of it at least once a week, more often if breathing getting worse, and let me know if losing ground.   I will refer to start annual screening for lung cancer due in December 2023    Pulmonary follow up is as needed

## 2021-06-23 NOTE — Assessment & Plan Note (Addendum)
HC03   06/14/21    =  31 so likely hypercarbic component has resolved  - 06/23/2021   Walked 250 ft  at a slow pace with a cane. Complained of back and left knee hurting with lowest sat 89% so d/c'd 02 and continue bipap hs per Dr Annamaria Boots or Chodri, his choice  Advised: Make sure you check your oxygen saturation at your highest level of activity to be sure it stays over 90% and keep track of it at least once a week, more often if breathing getting worse, and let me know if losing ground.           Each maintenance medication was reviewed in detail including emphasizing most importantly the difference between maintenance and prns and under what circumstances the prns are to be triggered using an action plan format where appropriate.  Total time for H and P, chart review, counseling, reviewing 02 device(s) and generating customized AVS unique to this post hosp f/u office visit / same day charting = 45 min for pt not seen in over 2 y

## 2021-06-23 NOTE — Assessment & Plan Note (Signed)
Quit smoking 03/2019 - PFT's  04/07/2019  FEV1 1.58 (57 % ) ratio 0.54  p 0 % improvement from saba p nothing prior to study with DLCO  15.22 (66%) corrects to 3.05 (74%)  for alv volume and FV curve concave classically    Improving s need for bronchodilators but if remains limited by sob p recovers from AVR then reconsider rx as  Group B in terms of symptom/risk and laba/lama therefore appropriate rx at this point >>>  anoro or bevespi or stiolto, whichever his insurance covers   - add ICS if starts having flares which is much less likely if he remains off cigs/ advised.

## 2021-06-27 ENCOUNTER — Encounter: Payer: Self-pay | Admitting: Podiatry

## 2021-06-27 ENCOUNTER — Ambulatory Visit (INDEPENDENT_AMBULATORY_CARE_PROVIDER_SITE_OTHER): Payer: Medicare Other | Admitting: Podiatry

## 2021-06-27 DIAGNOSIS — D689 Coagulation defect, unspecified: Secondary | ICD-10-CM

## 2021-06-27 DIAGNOSIS — B351 Tinea unguium: Secondary | ICD-10-CM

## 2021-06-27 DIAGNOSIS — B353 Tinea pedis: Secondary | ICD-10-CM

## 2021-06-27 MED ORDER — KETOCONAZOLE 2 % EX CREA: TOPICAL_CREAM | CUTANEOUS | 0 refills | Status: DC

## 2021-06-27 NOTE — Progress Notes (Signed)
°  Subjective:  Patient ID: Kyle Lewis, male    DOB: Dec 26, 1945,  MRN: [id number]  Chief Complaint  Patient presents with   Nail Problem    Trim nails   76 y.o. male presents with the above complaint. History confirmed with patient. Denies new issues to the feet.  Objective:  Physical Exam: warm, good capillary refill, nail exam onychomycosis of the toenails, no trophic changes or ulcerative lesions. DP pulses palpable, PT pulses palpable, and protective sensation intact. Xerosis with scaling to the plantar foot bilaterally.  No images are attached to the encounter.  Assessment:   1. 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 Tinea pedis of both feet    Plan:  Patient was evaluated and treated and all questions answered.  Onychomycosis and Coagulation Defect -Nails palliatively debrided secondary to pain and coag defect  Procedure: Nail Debridement Type of Debridement: manual, sharp debridement. Instrumentation: Nail nipper, rotary burr. Number of Nails: 10   Tinea Pedis -Educated on etiology -Educated on hygiene to prevent recurrence including sanitation of shoegear -Rx for ketoconazole sent to patient's pharmacy  Return in about 3 months (around 09/25/2021) for Routine Foot Care - on Anticoagulant; tinea pedis.

## 2021-06-28 ENCOUNTER — Other Ambulatory Visit (HOSPITAL_COMMUNITY): Payer: Self-pay

## 2021-06-30 NOTE — Progress Notes (Deleted)
[facility] [place]                                     Cardiology Office Note:    Date:  06/30/2021   ID:  Kyle Lewis, DOB 05/16/1946, MRN [id number]  PCP:  [name], MD  [facility] Cardiologist:  Jenean Lindau, MD / Dr. Angelena Form & Dr. Cyndia Bent (TAVR) [facility] Electrophysiologist:  None   Referring MD: [name], MD   1 month s/p TAVR  History of Present Illness:    Kyle Lewis is a 76 y.o. male with a hx of medically managed CAD, AAA s/p repair 2016, carotid artery disease with known RICA occlusion and 81-15% LICA disease, chronic wounds, OSA on CPAP, hx of CVA/TIA, COPD on home 02, ongoing tobacco use, HTN and severe AS s/p TAVR (06/07/21) who presents to clinic for follow up.   He was recently admitted for acute hypoxic respiratory failure which was multifactorial due to underlying COPD, aortic stenosis, and diastolic heart failure requiring IV diruesis. He was diagnosed with severe AS. [facility] 12/27 showed moderate CAD to be managed medically. He underwent successful TAVR with a 29 mm Edwards Sapien 3 THV via the TF approach on 06/07/21. Post op echo showed EF 60%, normally functioning TAVR with a mean gradient of 16.5 mmHg and no PVL as well as mild MR. He was discharged on aspirin alone. He was discharged on home 02. He was discharged with home health but SNF was discussed.  He was seen by PCP for post hospital follow up and reported worsening weakness and there was discussion about admission to SNF. Labs were ordered showed creat 0.79 and normal CBC and UA without UTI. I saw the patient later that week and he reported doing much better. They had self discontinued Lasix due to urinary incontinence. He was started on Zoloft.  Today the patient presents to clinic for follow up. *** renal mass***  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  05/2015   Abscess of left leg    Acute kidney injury superimposed on chronic kidney disease ([place]) 01/10/2018   Adenomatous colon polyp    Adrenal mass (HCC)    2 benign appearing left adrenal adenomas noted on 01/13/15 CT   Allergy    seasonal   Aortic stenosis    mild to moderate by echo and cath in 09/2016   Arthritis    Carotid artery occlusion    Cataract    Chronic back pain    COPD (chronic obstructive pulmonary disease) (HCC)    Coronary artery disease    CVA (cerebral infarction)    Degeneration of cervical intervertebral disc    Diverticulosis    History of back surgery    Rods and Screws in back   History of left knee replacement    Hypertension    Meningioma (HCC)    Nocturia    Peripheral vascular disease ([place])    Ringing in ear    (SLIGHT)   Ruptured abdominal aortic aneurysm (AAA) 07/01/2015   Formatting of this note might be different from the original. Overview:  Last evaluation by ultrasounds with diameter of 5 cm follow-up by Dr. Oneida Alar last seen in June 10, 2015 Formatting of this note might be different  from the original. Last evaluation by ultrasounds with diameter of 5 cm follow-up by Dr. 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 Angelena Form & Dr. Cyndia Bent   Sleep apnea    wears BIPAP set on 5 to 9   Stroke [facility]) 2013   tia no residual deficit from    Past Surgical History:  Procedure Laterality Date   ABDOMINAL AORTIC ANEURYSM REPAIR  2019   [place]  as child   BACK SURGERY  2018   Rods and Screws in Back lower back   BRAIN MENINGIOMA EXCISION  1991   menigioma   CARPAL TUNNEL RELEASE     left   EYE SURGERY Bilateral    Cataract   IRRIGATION AND DEBRIDEMENT ABSCESS Left 08/25/2020   Procedure: IRRIGATION AND DEBRIDEMENT ABSCESS LEFT LEG;  Surgeon: Evelina Bucy, DPM;  Location: [place];   Service: Podiatry;  Laterality: Left;   JOINT REPLACEMENT Left 04-01-12   Knee   LEFT HEART CATH AND CORONARY ANGIOGRAPHY N/A 09/15/2016   Procedure: Left Heart Cath and Coronary Angiography;  Surgeon: Nelva Bush, MD;  Location: [place] [facility];  Service: Cardiovascular;  Laterality: N/A;   MAXIMUM ACCESS (MAS)POSTERIOR LUMBAR INTERBODY FUSION (PLIF) 1 LEVEL N/A 07/07/2015   Procedure:  POSTERIOR LUMBAR INTERBODY FUSION (PLIF) Lumbar Four-Five with Pedicle Screw Fixation Lumbar Two-Five;Laminectomy Lumbar Two-Five;  Surgeon: Eustace Moore, MD;  Location: [facility];  Service: Neurosurgery;  Laterality: N/A;   POSTERIOR LUMBAR INTERBODY FUSION (PLIF) Lumbar Four-Five with Pedicle Screw Fixation Lumbar Two-Five;Laminectomy Lumbar Two-Five   RIGHT/LEFT HEART CATH AND CORONARY ANGIOGRAPHY N/A 01/24/2018   Procedure: RIGHT/LEFT HEART CATH AND CORONARY ANGIOGRAPHY;  Surgeon: Nelva Bush, MD;  Location: [place] [facility];  Service: Cardiovascular;  Laterality: N/A;   RIGHT/LEFT HEART CATH AND CORONARY ANGIOGRAPHY N/A 05/31/2021   Procedure: RIGHT/LEFT HEART CATH AND CORONARY ANGIOGRAPHY;  Surgeon: [place] Blanks, MD;  Location: [place] [facility];  Service: Cardiovascular;  Laterality: N/A;   TEE WITHOUT CARDIOVERSION N/A 06/07/2021   Procedure: TRANSESOPHAGEAL ECHOCARDIOGRAM (TEE);  Surgeon: [place] Blanks, MD;  Location: [place];  Service: Open Heart Surgery;  Laterality: N/A;   TONSILLECTOMY  as child   TOTAL KNEE ARTHROPLASTY  04/01/2012   Procedure: TOTAL KNEE ARTHROPLASTY;  Surgeon: Gearlean Alf, MD;  Location: [facility];  Service: Orthopedics;  Laterality: Left;   TRANSCATHETER AORTIC VALVE REPLACEMENT, TRANSFEMORAL Bilateral 06/07/2021   Procedure: TRANSCATHETER AORTIC VALVE REPLACEMENT, RIGHT TRANSFEMORAL;  Surgeon: [place] Blanks, MD;  Location: [place];  Service: Open Heart Surgery;  Laterality: Bilateral;   TRANSURETHRAL RESECTION OF PROSTATE     ULTRASOUND  GUIDANCE FOR VASCULAR ACCESS Bilateral 06/07/2021   Procedure: ULTRASOUND GUIDANCE FOR VASCULAR ACCESS;  Surgeon: [place] Blanks, MD;  Location: [place];  Service: Open Heart Surgery;  Laterality: Bilateral;   UMBILICAL HERNIA REPAIR  2011    Current Medications: No outpatient medications have been marked as taking for the 07/01/21 encounter (Appointment) with Eileen Stanford, PA-C.     Allergies:   Cefazolin   Social History   Socioeconomic History   Marital status: Married    Spouse name: [name]    Number of children: 2   Years of education: College    Highest education level: Not on file  Occupational History  Comment: Self employed Administrator retired  Tobacco Use   Smoking status: Former    Packs/day: 0.75    Years: 40.00    Pack years: 30.00    Types: Cigarettes    Quit date: 05/05/2021    Years since quitting: 0.1   Smokeless tobacco: Never   Tobacco comments:    1/2 pack a day if that   Vaping Use   Vaping Use: Never used  Substance and Sexual Activity   Alcohol use: Yes    Comment: rare   Drug use: No   Sexual activity: Not Currently  Other Topics Concern   Not on file  Social History Narrative   Patient lives at home with his wife. Rhonda    Patient has a Financial risk analyst.    Patient has 2 sons.    Patient smokes a half pack a day.          Social Determinants of Health   Financial Resource Strain: Not on file  Food Insecurity: Not on file  Transportation Needs: Not on file  Physical Activity: Not on file  Stress: Not on file  Social Connections: Not on file     Family History: The patient's family history includes Arthritis in his father and mother; Dementia (age of onset: 35) in his father; Diabetes in his father; Heart attack in his father; Heart disease in his father and mother; Hyperlipidemia in his mother; Hypertension in his mother; Prostate cancer in his maternal grandfather. There is no history of Colon cancer, Esophageal  cancer, Rectal cancer, or Stomach cancer.  ROS:   Please see the history of present illness.    All other systems reviewed and are negative.  EKGs/Labs/Other Studies Reviewed:    The following studies were reviewed today:  05/31/21 RIGHT/LEFT HEART CATH AND CORONARY ANGIOGRAPHY   Conclusion      Prox LAD lesion is 25% stenosed.   Dist Cx lesion is 40% stenosed.   Ost 2nd Mrg to 2nd Mrg lesion is 70% stenosed.   Dist RCA lesion is 60% stenosed.   Ost RPDA to RPDA lesion is 40% stenosed.   The LAD is a large caliber vessel that courses to the apex. There is mild mid LAD stenosis The Circumflex is a large caliber vessel that gives off a small obtuse marginal branch then 2 moderate caliber obtuse marginal branches. The second obtuse marginal branch has a moderate stenosis, unchanged from last cath in 2019.  The RCA is a large dominant artery. The distal RCA has a moderate stenosis, unchanged from last cath in 2019.  Severe aortic stenosis (mean gradient 50.6 mmHg, 59 mmHg, AVA 0.93 cm2).    Recommendations: Will continue workup for TAVR. His right and left heart pressures are only mildly abnormal following inpatient diuresis. Continue diuresis. The structural heart team will provide a formal consultation regarding his candidacy for TAVR.    ______________________   TAVR OPERATIVE NOTE     Date of Procedure:                06/07/2021   Preoperative Diagnosis:      Severe Aortic Stenosis    Postoperative Diagnosis:    Same    Procedure:        Transcatheter Aortic Valve Replacement - Percutaneous Right Transfemoral Approach             Edwards Sapien 3 THV (size 29 mm, model # 9600TFX, serial # [id number])  Co-Surgeons:                        Gaye Pollack, MD and Lauree Chandler, MD     Anesthesiologist:                  Jenita Seashore, MD   Echocardiographer:              Edmonia James, MD   Pre-operative Echo Findings: Severe aortic stenosis Normal left  ventricular systolic function   Post-operative Echo Findings: No paravalvular leak Normal left ventricular systolic function     __________________    Echo 06/08/21: IMPRESSIONS  1. The aortic valve has been repaired/replaced. There is a 29 mm Edwards  Ultra, stented (TAVR) valve present in the aortic position. Echo findings  are consistent with normal structure and function of the aortic valve  prosthesis.      Mean gradient 67mmHg, DI 0.5. No paravalvular leak.   2. Left ventricular ejection fraction, by estimation, is 60 to 65%. The  left ventricle has normal function. The left ventricle has no regional  wall motion abnormalities. There is severe concentric left ventricular  hypertrophy. Indeterminate diastolic  filling due to E-A fusion.   3. Right ventricular systolic function is normal. The right ventricular  size is normal. There is normal pulmonary artery systolic pressure. The  estimated right ventricular systolic pressure is 78.9 mmHg.   4. Left atrial size was mildly dilated.   5. The mitral valve is degenerative. There is moderate mitral annular  calcification. Trivial-to-mild mitral valve regurgitation.   6. The inferior vena cava is dilated in size with >50% respiratory  variability, suggesting right atrial pressure of 8 mmHg.   Comparison(s): Compared to prior TTE on 05/28/21, the patient is now s/p  TAVR with normal functioning prosthesis on doppler interrogation. Mitral  regurgitation now appears improved to trivial-to-mild (previously  moderate).   ________________________  Echo 07/01/21 ***  EKG:  EKG is NOT ordered today.   Recent Labs: 05/27/2021: B Natriuretic Peptide 1,348.7 05/28/2021: ALT 29 06/08/2021: Magnesium 2.1 06/14/2021: BUN 25; Creat 0.79; Hemoglobin 15.3; Platelets 208; Potassium 4.5; Sodium 142  Recent Lipid Panel    Component Value Date/Time   CHOL 159 05/31/2021 0419   CHOL 144 02/01/2018 1101   TRIG 64 05/31/2021 0419   HDL 58  05/31/2021 0419   HDL 48 02/01/2018 1101   CHOLHDL 2.7 05/31/2021 0419   VLDL 13 05/31/2021 0419   LDLCALC 88 05/31/2021 0419   LDLCALC 95 04/08/2020 1506     Risk Assessment/Calculations:       Physical Exam:    VS:  There were no vitals taken for this visit.    Wt Readings from Last 3 Encounters:  06/23/21 208 lb 9.6 oz (94.6 kg)  06/15/21 202 lb 9.6 oz (91.9 kg)  06/14/21 201 lb (91.2 kg)     GEN:  Well nourished, well developed in no acute distress, overweight  HEENT: Normal NECK: No JVD LYMPHATICS: No lymphadenopathy CARDIAC: RRR, no murmurs, rubs, gallops RESPIRATORY:  Clear to auscultation without rales, wheezing or rhonchi  ABDOMEN: Soft, non-tender, non-distended MUSCULOSKELETAL:  No edema; No deformity  SKIN: Warm and dry NEUROLOGIC:  Alert and oriented x 3 PSYCHIATRIC:  Normal affect   ASSESSMENT:    1. S/P TAVR (transcatheter aortic valve replacement)   2. Chronic diastolic CHF (congestive heart failure) ([place])   3. Coronary artery disease involving native heart without angina pectoris, unspecified  vessel or lesion type   4. Chronic obstructive pulmonary disease, unspecified COPD type ([place])   5. Tobacco abuse   6. Chronic respiratory failure with hypoxia (HCC)   7. Renal lesion   8. Depression, unspecified depression type     PLAN:    In order of problems listed above:  Severe AS s/p TAVR:   SBE prophylaxis discussed; I have RX'd amoxicillin. Continue on a baby aspirin. I will see him back for 1 year follow up and echo.   Chronic diastolic CHF:   CAD: pre TAVR cath showed stable moderate non obstructive CAD. Continued medical therapy.   COPD/Tobacco abuse/Chronic hypoxic respiratory failure: has self discontinued home 02 with stable 02 sats and 02 sat 95% on RA today. He has abstained from smoking and interested in nicotine patches. We spent at least 5 minutes discussing the importance of complete tobacco cessation.    Renal lesion: pre TAVR CT  showed a hypodense exophytic 1.6 cm anterior interpolar left renal cortical lesion, indeterminate for renal neoplasm, mildly increased from 1.2 cm on 10/25/2018 MRI, not well evaluated on prior MRI due to artifact. Suggest attention on follow-up renal mass protocol CT abdomen without and with IV contrast in 3-6 months. ***  Depression:     Medication Adjustments/Labs and Tests Ordered: Current medicines are reviewed at length with the patient today.  Concerns regarding medicines are outlined above.  No orders of the defined types were placed in this encounter.  No orders of the defined types were placed in this encounter.   There are no Patient Instructions on file for this visit.   Signed, Angelena Form, PA-C  06/30/2021 5:23 PM    [facility]

## 2021-07-01 ENCOUNTER — Other Ambulatory Visit (HOSPITAL_COMMUNITY): Payer: Medicare Other

## 2021-07-01 ENCOUNTER — Ambulatory Visit: Payer: Medicare Other | Admitting: Physician Assistant

## 2021-07-01 DIAGNOSIS — J9611 Chronic respiratory failure with hypoxia: Secondary | ICD-10-CM

## 2021-07-01 DIAGNOSIS — J449 Chronic obstructive pulmonary disease, unspecified: Secondary | ICD-10-CM

## 2021-07-01 DIAGNOSIS — Z720 Tobacco use: Secondary | ICD-10-CM

## 2021-07-01 DIAGNOSIS — I2510 Atherosclerotic heart disease of native coronary artery without angina pectoris: Secondary | ICD-10-CM

## 2021-07-01 DIAGNOSIS — I5032 Chronic diastolic (congestive) heart failure: Secondary | ICD-10-CM

## 2021-07-01 DIAGNOSIS — F32A Depression, unspecified: Secondary | ICD-10-CM

## 2021-07-01 DIAGNOSIS — Z952 Presence of prosthetic heart valve: Secondary | ICD-10-CM

## 2021-07-01 DIAGNOSIS — N289 Disorder of kidney and ureter, unspecified: Secondary | ICD-10-CM

## 2021-07-11 ENCOUNTER — Other Ambulatory Visit (HOSPITAL_COMMUNITY): Payer: Self-pay

## 2021-07-13 ENCOUNTER — Ambulatory Visit (INDEPENDENT_AMBULATORY_CARE_PROVIDER_SITE_OTHER): Payer: Medicare Other | Admitting: Physician Assistant

## 2021-07-13 ENCOUNTER — Other Ambulatory Visit (HOSPITAL_COMMUNITY): Payer: Self-pay

## 2021-07-13 ENCOUNTER — Encounter (HOSPITAL_COMMUNITY): Payer: Self-pay | Admitting: Physician Assistant

## 2021-07-13 ENCOUNTER — Other Ambulatory Visit: Payer: Self-pay | Admitting: Physician Assistant

## 2021-07-13 ENCOUNTER — Other Ambulatory Visit: Payer: Self-pay

## 2021-07-13 ENCOUNTER — Other Ambulatory Visit (HOSPITAL_COMMUNITY): Payer: Medicare Other

## 2021-07-13 VITALS — BP 160/70 | HR 70 | Ht 68.0 in | Wt 212.0 lb

## 2021-07-13 DIAGNOSIS — I5032 Chronic diastolic (congestive) heart failure: Secondary | ICD-10-CM

## 2021-07-13 DIAGNOSIS — Z952 Presence of prosthetic heart valve: Secondary | ICD-10-CM | POA: Diagnosis not present

## 2021-07-13 DIAGNOSIS — N2889 Other specified disorders of kidney and ureter: Secondary | ICD-10-CM

## 2021-07-13 DIAGNOSIS — N289 Disorder of kidney and ureter, unspecified: Secondary | ICD-10-CM

## 2021-07-13 DIAGNOSIS — J449 Chronic obstructive pulmonary disease, unspecified: Secondary | ICD-10-CM

## 2021-07-13 DIAGNOSIS — I2510 Atherosclerotic heart disease of native coronary artery without angina pectoris: Secondary | ICD-10-CM | POA: Diagnosis not present

## 2021-07-13 NOTE — Progress Notes (Signed)
Ct abdomen renal protocol

## 2021-07-13 NOTE — Patient Instructions (Addendum)
Medication Instructions:  Your physician recommends that you continue on your current medications as directed. Please refer to the Current Medication list given to you today.  *If you need a refill on your cardiac medications before your next appointment, please call your pharmacy*   Lab Work: None ordered   If you have labs (blood work) drawn today and your tests are completely normal, you will receive your results only by: [place] (if you have MyChart) OR A paper copy in the mail If you have any lab test that is abnormal or we need to change your treatment, we will call you to review the results.   Testing/Procedures: Your physician has requested that you have an echocardiogram at the [facility]. Echocardiography is a painless test that uses sound waves to create images of your heart. It provides your doctor with information about the size and shape of your heart and how well your hearts chambers and valves are working. This procedure takes approximately one hour. There are no restrictions for this procedure.  Your physician has ordered for you to have a CT of your abdomen   Follow-Up: Follow up as scheduled    Other Instructions None

## 2021-07-13 NOTE — Progress Notes (Signed)
HE[facility] AND [place]                                     Cardiology Office Note:    Date:  07/13/2021   ID:  Kyle Lewis, DOB [birth year], MRN [id number]  PCP:  [name], MD  [facility] Cardiologist:  Jenean Lindau, MD / Dr. Angelena Form & Dr. Cyndia Bent (TAVR) [facility] Electrophysiologist:  None   Referring MD: [name], MD   1 month s/p TAVR  History of Present Illness:    Kyle Lewis is a 76 y.o. male with a hx of medically managed CAD, AAA s/p repair 2016, carotid artery disease with known RICA occlusion and 01-60% LICA disease, chronic wounds, OSA on CPAP, hx of CVA/TIA, COPD on home 02, ongoing tobacco use, HTN and severe AS s/p TAVR (06/07/21) who presents to clinic for follow up.    He was recently admitted for acute hypoxic respiratory failure which was multifactorial due to underlying COPD, aortic stenosis, and diastolic heart failure requiring IV diruesis. He was diagnosed with severe AS. [facility] 12/27 showed moderate CAD to be managed medically. He underwent successful TAVR with a 29 mm Edwards Sapien 3 THV via the TF approach on 06/07/21. Post op echo showed EF 60%, normally functioning TAVR with a mean gradient of 16.5 mmHg and no PVL as well as mild MR. He was discharged on aspirin alone. He was discharged on home 02. He was discharged with home health but SNF was discussed.   He was seen by PCP for post hospital follow up and reported worsening weakness and there was discussion about admission to SNF. Labs were ordered showed creat 0.79 and normal CBC and UA without UTI. I saw the patient later that week and he reported doing much better. They had self discontinued Lasix due to urinary incontinence. He was started on Zoloft.   Today the patient presents to clinic for follow up. Here alone because wife has been having a lot of health issues recently. Doing great. No CP or SOB. No LE edema, orthopnea or PND. No  dizziness or syncope. No blood in stool or urine. No palpitations.    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   Abscess of left leg    Acute kidney injury superimposed on chronic kidney disease ([place]) 01/10/2018   Adenomatous colon polyp    Adrenal mass (HCC)    2 benign appearing left adrenal adenomas noted on 01/13/15 CT   Allergy    seasonal   Aortic stenosis    mild to moderate by echo and cath in 09/2016   Arthritis    Carotid artery occlusion    Cataract    Chronic back pain    COPD (chronic obstructive pulmonary disease) (HCC)    Coronary artery disease    CVA (cerebral infarction)    Degeneration of cervical intervertebral disc    Diverticulosis    History of back surgery    Rods and Screws in back   History of left knee replacement    Hypertension    Meningioma (HCC)    Nocturia    Peripheral vascular disease ([place])    Ringing in ear    ([place])   Ruptured abdominal aortic aneurysm (AAA) 07/01/2015  Formatting of this note might be different from the original. Overview:  Last evaluation by ultrasounds with diameter of 5 cm follow-up by Dr. Oneida Alar last seen in June 10, 2015 Formatting of this note might be different from the original. Last evaluation by ultrasounds with diameter of 5 cm follow-up by Dr. 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 Angelena Form & Dr. Cyndia Bent   Sleep apnea    wears BIPAP set on 5 to 9   Stroke [facility]) 2013   tia no residual deficit from    Past Surgical History:  Procedure Laterality Date   ABDOMINAL AORTIC ANEURYSM REPAIR  2019   [place]  as child   BACK SURGERY  2018   Rods and Screws in Back lower back   [place]     left   EYE SURGERY Bilateral    Cataract    IRRIGATION AND DEBRIDEMENT ABSCESS Left 08/25/2020   Procedure: IRRIGATION AND DEBRIDEMENT ABSCESS LEFT LEG;  Surgeon: Evelina Bucy, DPM;  Location: [place];  Service: Podiatry;  Laterality: Left;   JOINT REPLACEMENT Left 04-01-12   Knee   LEFT HEART CATH AND CORONARY ANGIOGRAPHY N/A 09/15/2016   Procedure: Left Heart Cath and Coronary Angiography;  Surgeon: Nelva Bush, MD;  Location: [place] CV LAB;  Service: Cardiovascular;  Laterality: N/A;   MAXIMUM ACCESS (MAS)POSTERIOR LUMBAR INTERBODY FUSION (PLIF) 1 LEVEL N/A 07/07/2015   Procedure:  POSTERIOR LUMBAR INTERBODY FUSION (PLIF) Lumbar Four-Five with Pedicle Screw Fixation Lumbar Two-Five;Laminectomy Lumbar Two-Five;  Surgeon: Eustace Moore, MD;  Location: [place] [facility];  Service: Neurosurgery;  Laterality: N/A;   POSTERIOR LUMBAR INTERBODY FUSION (PLIF) Lumbar Four-Five with Pedicle Screw Fixation Lumbar Two-Five;Laminectomy Lumbar Two-Five   RIGHT/LEFT HEART CATH AND CORONARY ANGIOGRAPHY N/A 01/24/2018   Procedure: RIGHT/LEFT HEART CATH AND CORONARY ANGIOGRAPHY;  Surgeon: Nelva Bush, MD;  Location: [place] CV LAB;  Service: Cardiovascular;  Laterality: N/A;   RIGHT/LEFT HEART CATH AND CORONARY ANGIOGRAPHY N/A 05/31/2021   Procedure: RIGHT/LEFT HEART CATH AND CORONARY ANGIOGRAPHY;  Surgeon: Burnell Blanks, MD;  Location: [place] CV LAB;  Service: Cardiovascular;  Laterality: N/A;   TEE WITHOUT CARDIOVERSION N/A 06/07/2021   Procedure: TRANSESOPHAGEAL ECHOCARDIOGRAM (TEE);  Surgeon: Burnell Blanks, MD;  Location: [place];  Service: Open Heart Surgery;  Laterality: N/A;   TONSILLECTOMY  as child   TOTAL KNEE ARTHROPLASTY  04/01/2012   Procedure: TOTAL KNEE ARTHROPLASTY;  Surgeon: Gearlean Alf, MD;  Location: [facility];  Service: Orthopedics;  Laterality: Left;   TRANSCATHETER AORTIC VALVE REPLACEMENT, TRANSFEMORAL Bilateral 06/07/2021   Procedure: TRANSCATHETER AORTIC VALVE REPLACEMENT, RIGHT  TRANSFEMORAL;  Surgeon: Burnell Blanks, MD;  Location: [place];  Service: Open Heart Surgery;  Laterality: Bilateral;   TRANSURETHRAL RESECTION OF PROSTATE     ULTRASOUND GUIDANCE FOR VASCULAR ACCESS Bilateral 06/07/2021   Procedure: ULTRASOUND GUIDANCE FOR VASCULAR ACCESS;  Surgeon: Burnell Blanks, MD;  Location: [place];  Service: Open Heart Surgery;  Laterality: Bilateral;   UMBILICAL HERNIA REPAIR  2011    Current Medications: Current Meds  Medication Sig   acetaminophen (TYLENOL) 650 MG CR tablet Take 1,300 mg by mouth at bedtime.   albuterol (VENTOLIN HFA) 108 (90 Base) MCG/ACT inhaler Inhale 2 puffs into the  lungs every 6 (six) hours as needed for wheezing or shortness of breath.   amoxicillin (AMOXIL) 500 MG tablet Take 4 tablets (2,000 mg total) by mouth as directed. 1 HOUR PRIOR TO DENTAL PROCEDURES   aspirin 81 MG EC tablet Take 1 tablet (81 mg total) by mouth daily. Swallow whole.   atorvastatin (LIPITOR) 40 MG tablet Take 1 tablet (40 mg total) by mouth daily.   Cholecalciferol (D-3-5) 125 MCG (5000 UT) capsule Take 5,000 Units by mouth every morning.   ciprofloxacin (CIPRO) 500 MG tablet Take 1 tablet (500 mg total) by mouth 2 (two) times daily.   finasteride (PROSCAR) 5 MG tablet Take 1 tablet (5 mg total) by mouth daily.   fluticasone furoate-vilanterol (BREO ELLIPTA) 200-25 MCG/ACT AEPB Inhale 1 puff into the lungs daily.   furosemide (LASIX) 40 MG tablet Take 1 tablet (40 mg total) by mouth as needed.   gabapentin (NEURONTIN) 300 MG capsule Take 1 capsule (300 mg total) by mouth 2 (two) times daily.   guaiFENesin 200 MG tablet Take 2 tablets (400 mg total) by mouth 2 (two) times daily.   ketoconazole (NIZORAL) 2 % cream Apply 1 fingertip amount to each foot daily.   loperamide (IMODIUM) 2 MG capsule Take 2 mg by mouth daily as needed for diarrhea or loose stools.   methocarbamol (ROBAXIN) 500 MG tablet Take 1 tablet (500 mg total) by mouth at bedtime as needed  for muscle spasms.   metoprolol tartrate (LOPRESSOR) 25 MG tablet Take 1/2 tablet (12.5 mg total) by mouth 2 (two) times daily.   mirabegron ER (MYRBETRIQ) 25 MG TB24 tablet Take 25 mg by mouth every morning.   nicotine (NICODERM CQ - DOSED IN MG/24 HOURS) 21 mg/24hr patch Place 1 patch (21 mg total) onto the skin daily as needed.   olmesartan (BENICAR) 40 MG tablet Take 1 tablet (40 mg total) by mouth daily. Please hold this medication, please discuss with your cardiology when should you restart this medication,   PRESCRIPTION MEDICATION Inhale into the lungs at bedtime. BIPAP   sertraline (ZOLOFT) 50 MG tablet Take 1 tablet (50 mg total) by mouth daily.   tamsulosin (FLOMAX) 0.4 MG CAPS capsule Take 1 capsule (0.4 mg total) by mouth daily after supper.   vitamin B-12 (CYANOCOBALAMIN) 1000 MCG tablet Take 1,000 mcg by mouth every morning.     Allergies:   Cefazolin   Social History   Socioeconomic History   Marital status: Married    Spouse name: [name]    Number of children: 2   Years of education: College    Highest education level: Not on file  Occupational History    Comment: Self employed Administrator retired  Tobacco Use   Smoking status: Former    Packs/day: 0.75    Years: 40.00    Pack years: 30.00    Types: Cigarettes    Quit date: 05/05/2021    Years since quitting: 0.1   Smokeless tobacco: Never   Tobacco comments:    1/2 pack a day if that   Vaping Use   Vaping Use: Never used  Substance and Sexual Activity   Alcohol use: Yes    Comment: rare   Drug use: No   Sexual activity: Not Currently  Other Topics Concern   Not on file  Social History Narrative   Patient lives at home with his wife. Rhonda    Patient has a Financial risk analyst.    Patient has 2 sons.  Patient smokes a half pack a day.          Social Determinants of Health   Financial Resource Strain: Not on file  Food Insecurity: Not on file  Transportation Needs: Not on file  Physical  Activity: Not on file  Stress: Not on file  Social Connections: Not on file     Family History: The patient's family history includes Arthritis in his father and mother; Dementia (age of onset: 75) in his father; Diabetes in his father; Heart attack in his father; Heart disease in his father and mother; Hyperlipidemia in his mother; Hypertension in his mother; Prostate cancer in his maternal grandfather. There is no history of Colon cancer, Esophageal cancer, Rectal cancer, or Stomach cancer.  ROS:   Please see the history of present illness.    All other systems reviewed and are negative.  EKGs/Labs/Other Studies Reviewed:    The following studies were reviewed today:  05/31/21 RIGHT/LEFT HEART CATH AND CORONARY ANGIOGRAPHY    Conclusion       Prox LAD lesion is 25% stenosed.   Dist Cx lesion is 40% stenosed.   Ost 2nd Mrg to 2nd Mrg lesion is 70% stenosed.   Dist RCA lesion is 60% stenosed.   Ost RPDA to RPDA lesion is 40% stenosed.   The LAD is a large caliber vessel that courses to the apex. There is mild mid LAD stenosis The Circumflex is a large caliber vessel that gives off a small obtuse marginal branch then 2 moderate caliber obtuse marginal branches. The second obtuse marginal branch has a moderate stenosis, unchanged from last cath in 2019.  The RCA is a large dominant artery. The distal RCA has a moderate stenosis, unchanged from last cath in 2019.  Severe aortic stenosis (mean gradient 50.6 mmHg, 59 mmHg, AVA 0.93 cm2).    Recommendations: Will continue workup for TAVR. His right and left heart pressures are only mildly abnormal following inpatient diuresis. Continue diuresis. The structural heart team will provide a formal consultation regarding his candidacy for TAVR.      ______________________     TAVR OPERATIVE NOTE     Date of Procedure:                06/07/2021   Preoperative Diagnosis:      Severe Aortic Stenosis    Postoperative Diagnosis:    Same     Procedure:        Transcatheter Aortic Valve Replacement - Percutaneous Right Transfemoral Approach             Edwards Sapien 3 THV (size 29 mm, model # 9600TFX, serial # [id number])              Co-Surgeons:                        Gaye Pollack, MD and Lauree Chandler, MD     Anesthesiologist:                  Jenita Seashore, MD   Echocardiographer:              Edmonia James, MD   Pre-operative Echo Findings: Severe aortic stenosis Normal left ventricular systolic function   Post-operative Echo Findings: No paravalvular leak Normal left ventricular systolic function     __________________    Echo 06/08/21: IMPRESSIONS  1. The aortic valve has been repaired/replaced. There is a 29 mm Edwards  Ultra, stented (TAVR) valve  present in the aortic position. Echo findings  are consistent with normal structure and function of the aortic valve  prosthesis.      Mean gradient 41mmHg, DI 0.5. No paravalvular leak.   2. Left ventricular ejection fraction, by estimation, is 60 to 65%. The  left ventricle has normal function. The left ventricle has no regional  wall motion abnormalities. There is severe concentric left ventricular  hypertrophy. Indeterminate diastolic  filling due to E-A fusion.   3. Right ventricular systolic function is normal. The right ventricular  size is normal. There is normal pulmonary artery systolic pressure. The  estimated right ventricular systolic pressure is 37.1 mmHg.   4. Left atrial size was mildly dilated.   5. The mitral valve is degenerative. There is moderate mitral annular  calcification. Trivial-to-mild mitral valve regurgitation.   6. The inferior vena cava is dilated in size with >50% respiratory  variability, suggesting right atrial pressure of 8 mmHg.   Comparison(s): Compared to prior TTE on 05/28/21, the patient is now s/p  TAVR with normal functioning prosthesis on doppler interrogation. Mitral  regurgitation now appears improved to  trivial-to-mild (previously  moderate).    ________________________    EKG:  EKG is not ordered today.   Recent Labs: 05/27/2021: B Natriuretic Peptide 1,348.7 05/28/2021: ALT 29 06/08/2021: Magnesium 2.1 06/14/2021: BUN 25; Creat 0.79; Hemoglobin 15.3; Platelets 208; Potassium 4.5; Sodium 142  Recent Lipid Panel    Component Value Date/Time   CHOL 159 05/31/2021 0419   CHOL 144 02/01/2018 1101   TRIG 64 05/31/2021 0419   HDL 58 05/31/2021 0419   HDL 48 02/01/2018 1101   CHOLHDL 2.7 05/31/2021 0419   VLDL 13 05/31/2021 0419   LDLCALC 88 05/31/2021 0419   LDLCALC 95 04/08/2020 1506     Risk Assessment/Calculations:       Physical Exam:    VS:  BP (!) 160/70 (BP Location: Left Arm, Patient Position: Sitting, Cuff Size: Normal)    Pulse 70    Ht 5\' 8"  (1.727 m)    Wt 212 lb (96.2 kg)    BMI 32.23 kg/m     Wt Readings from Last 3 Encounters:  07/13/21 212 lb (96.2 kg)  06/23/21 208 lb 9.6 oz (94.6 kg)  06/15/21 202 lb 9.6 oz (91.9 kg)     GEN:  Well nourished, well developed in no acute distress HEENT: Normal NECK: No JVD LYMPHATICS: No lymphadenopathy CARDIAC: RRR, no murmurs, rubs, gallops RESPIRATORY:  Clear to auscultation without rales, wheezing or rhonchi  ABDOMEN: Soft, non-tender, non-distended MUSCULOSKELETAL:  No edema; No deformity  SKIN: Warm and dry NEUROLOGIC:  Alert and oriented x 3 PSYCHIATRIC:  Normal affect   ASSESSMENT:    1. S/P TAVR (transcatheter aortic valve replacement)   2. Chronic diastolic CHF (congestive heart failure) ([place])   3. Coronary artery disease involving native heart without angina pectoris, unspecified vessel or lesion type   4. Chronic obstructive pulmonary disease, unspecified COPD type ([place])   5. Renal lesion    PLAN:    In order of problems listed above:  Severe AS s/p TAVR: unfortunately he got turned around driving here and missed his echo. This has been r/s to [facility] for 2/10. He has NYHA class II fatigue. SBE  prophylaxis discussed; I have RX'd amoxicillin. Continue on a baby aspirin. I will see him back for 1 year follow up and echo.    Chronic diastolic CHF: appears euvolemic. Only takes Lasix 40mg  PRN  CAD: pre TAVR cath showed stable moderate non obstructive CAD. Continued medical therapy.    COPD/Tobacco abuse/Chronic hypoxic respiratory failure: has self discontinued home 02 with stable 02 sats and 02 sat 95% on RA today. He has abstained from smoking and interested in nicotine patches. We spent at least 5 minutes discussing the importance of complete tobacco cessation. He has remained quit. I congratulated him    Renal lesion: pre TAVR CT showed a hypodense exophytic 1.6 cm anterior interpolar left renal cortical lesion, indeterminate for renal neoplasm, mildly increased from 1.2 cm on 10/25/2018 MRI, not well evaluated on prior MRI due to artifact. Suggest attention on follow-up renal mass protocol CT abdomen without and with IV contrast in 3-6 months. I will get this set up today. BMET in anticipation of this.   Depression: doing better on Prozac    Medication Adjustments/Labs and Tests Ordered: Current medicines are reviewed at length with the patient today.  Concerns regarding medicines are outlined above.  No orders of the defined types were placed in this encounter.  No orders of the defined types were placed in this encounter.   There are no Patient Instructions on file for this visit.   Signed, Angelena Form, PA-C  07/13/2021 4:07 PM    [facility]

## 2021-07-14 LAB — BASIC METABOLIC PANEL
BUN/Creatinine Ratio: 29 — ABNORMAL HIGH (ref 10–24)
BUN: 23 mg/dL (ref 8–27)
CO2: 23 mmol/L (ref 20–29)
Calcium: 8.9 mg/dL (ref 8.6–10.2)
Chloride: 105 mmol/L (ref 96–106)
Creatinine, Ser: 0.79 mg/dL (ref 0.76–1.27)
Glucose: 109 mg/dL — ABNORMAL HIGH (ref 70–99)
Potassium: 4.8 mmol/L (ref 3.5–5.2)
Sodium: 145 mmol/L — ABNORMAL HIGH (ref 134–144)
eGFR: 93 mL/min/{1.73_m2} (ref >59)

## 2021-07-15 ENCOUNTER — Other Ambulatory Visit (HOSPITAL_COMMUNITY): Payer: Medicare Other

## 2021-07-15 ENCOUNTER — Ambulatory Visit (INDEPENDENT_AMBULATORY_CARE_PROVIDER_SITE_OTHER): Payer: Medicare Other

## 2021-07-15 ENCOUNTER — Other Ambulatory Visit: Payer: Self-pay

## 2021-07-15 DIAGNOSIS — Z952 Presence of prosthetic heart valve: Secondary | ICD-10-CM | POA: Diagnosis not present

## 2021-07-15 LAB — ECHOCARDIOGRAM COMPLETE
AR max vel: 2.24 cm2
AV Area VTI: 2.34 cm2
AV Area mean vel: 2.34 cm2
AV Mean grad: 19 mmHg
AV Peak grad: 30.5 mmHg
Ao pk vel: 2.76 m/s
Area-P 1/2: 2.29 cm2
S' Lateral: 4.4 cm

## 2021-07-17 ENCOUNTER — Other Ambulatory Visit: Payer: Self-pay | Admitting: Family Medicine

## 2021-08-12 ENCOUNTER — Telehealth: Payer: Self-pay

## 2021-08-12 DIAGNOSIS — E785 Hyperlipidemia, unspecified: Secondary | ICD-10-CM

## 2021-08-12 MED ORDER — ATORVASTATIN CALCIUM 40 MG PO TABS: 40.0000 mg | ORAL_TABLET | Freq: Every day | ORAL | 2 refills | Status: DC

## 2021-08-12 NOTE — Telephone Encounter (Signed)
Patient's wife(Rhonda) called requesting a refill on patients Atorvastatin 40 MG. Medication was send into pharmacy per pt request. ?

## 2021-08-19 ENCOUNTER — Telehealth: Payer: Self-pay | Admitting: *Deleted

## 2021-08-19 NOTE — Telephone Encounter (Signed)
? ?  Primary Cardiologist: Jenean Lindau, MD ? ?Chart reviewed as part of pre-operative protocol coverage. Given past medical history and time since last visit, based on ACC/AHA guidelines, CARLUS STAY would be at acceptable risk for the planned procedure without further cardiovascular testing.  ? ?His aspirin will need to be continued throughout his procedure. ? ?His RCRI is a class IV risk, 11% risk of major cardiac event. ? ?I will route this recommendation to the requesting party via Epic fax function and remove from pre-op pool. ? ?Please call with questions. ? ?Jossie Ng. Rayburn Mundis NP-C ? ?  ?08/19/2021, 1:28 PM ?[place] ?[address] ?Office [phone] Fax [phone] ? ? ? ? ?

## 2021-08-19 NOTE — Telephone Encounter (Signed)
? ?  Pre-operative Risk Assessment  ?  ?Patient Name: Kyle Lewis  ?DOB: 22-Feb-1946 ?MRN: [id number]  ? ?  ? ?Request for Surgical Clearance   ? ?Procedure:   LEFT REVERSE SHOULDER ARTHROPLASTY ? ?Date of Surgery:  Clearance TBD                              ?   ?Surgeon:  DR. Lennette Bihari SUPPLE ?Surgeon's Group or Practice Name:  [facility] ?Phone number:  [phone] ?Fax number:  [phone] ATTN: [name] ?  ?Type of Clearance Requested:   ?- Medical  ?- Pharmacy:  Hold Aspirin   ?  ?Type of Anesthesia:  General  ?  ?Additional requests/questions:   ? ?Signed, ?Julaine Hua   ?08/19/2021, 12:30 PM  ? ?

## 2021-08-23 ENCOUNTER — Telehealth: Payer: Self-pay

## 2021-08-23 DIAGNOSIS — I5032 Chronic diastolic (congestive) heart failure: Secondary | ICD-10-CM

## 2021-08-23 NOTE — Telephone Encounter (Signed)
Bmp for Ct  ?

## 2021-08-26 ENCOUNTER — Other Ambulatory Visit: Payer: Self-pay

## 2021-08-26 MED ORDER — MIRABEGRON ER 25 MG PO TB24: 25.0000 mg | ORAL_TABLET | Freq: Every morning | ORAL | 0 refills | Status: DC

## 2021-08-26 NOTE — Progress Notes (Signed)
Patient's wife [name]) came in office today for samples of Myrbetriq 25 MG and 4 boxes of 7 tablets was giving to [place] for the patient. ?

## 2021-09-13 ENCOUNTER — Telehealth: Payer: Self-pay | Admitting: *Deleted

## 2021-09-13 ENCOUNTER — Encounter: Payer: Self-pay | Admitting: Family Medicine

## 2021-09-13 ENCOUNTER — Ambulatory Visit (INDEPENDENT_AMBULATORY_CARE_PROVIDER_SITE_OTHER): Payer: Medicare Other | Admitting: Family Medicine

## 2021-09-13 VITALS — BP 158/98 | HR 60 | Temp 97.7°F | Ht 68.0 in | Wt 222.0 lb

## 2021-09-13 DIAGNOSIS — I5033 Acute on chronic diastolic (congestive) heart failure: Secondary | ICD-10-CM

## 2021-09-13 DIAGNOSIS — I2510 Atherosclerotic heart disease of native coronary artery without angina pectoris: Secondary | ICD-10-CM

## 2021-09-13 DIAGNOSIS — I10 Essential (primary) hypertension: Secondary | ICD-10-CM | POA: Diagnosis not present

## 2021-09-13 DIAGNOSIS — R351 Nocturia: Secondary | ICD-10-CM | POA: Diagnosis not present

## 2021-09-13 DIAGNOSIS — N401 Enlarged prostate with lower urinary tract symptoms: Secondary | ICD-10-CM

## 2021-09-13 DIAGNOSIS — N138 Other obstructive and reflux uropathy: Secondary | ICD-10-CM

## 2021-09-13 NOTE — Telephone Encounter (Signed)
Received Surgery Clearance Form: ? ?Request for Surgical Clearance   ?  ?Procedure:   LEFT REVERSE SHOULDER ARTHROPLASTY ?  ?Date of Surgery:  Clearance TBD                              ?   ?Surgeon:  DR. Lennette Bihari SUPPLE ?Surgeon's Group or Practice Name:  [facility] ?Phone number:  [phone] ?Fax number:  [phone] ATTN: [name] ? ?Dr. Sabra Heck stated that he will fill out, that patient recently had an appointment.  ?Placed form in his folder to fill out.  ?

## 2021-09-13 NOTE — Progress Notes (Signed)
? ? ?Provider:  ?[name], MD ? ?Careteam: ?Patient Care Team: ?[name], MD as PCP - General (Family Medicine) ?Revankar, Reita Cliche, MD as PCP - Cardiology (Cardiology) ?Druscilla Brownie, MD as Consulting Physician (Dermatology) ?Garvin Fila, MD as Consulting Physician (Neurology) ? ?PLACE OF SERVICE:  ?[facility]  ?Advanced Directive information ?  ? ?Allergies  ?Allergen Reactions  ? Cefazolin Rash and Other (See Comments)  ?  The patient had surgery and was given cefazolin intraop. ~ 10 days later he developed a rash confirmed by biopsy to be consistent w/ drug eruption. We cannot know for sure, but this is the most likely agent.  ?  ? ? ?No chief complaint on file. ? ? ? ?HPI: Patient is a 76 y.o. male .  Patient is scheduled for a reverse shoulder arthroplasty arthroplasty.  I am not really certain what this means whether this means shoulder replacement.  He has not had any previous surgery. ?Since he had aortic valve replacement back in December has done much better.  He has clearance from cardiology office regarding surgery. ?He denies any respiratory symptoms.  He does use CPAP at night. ? ?Review of Systems:  ?Review of Systems  ?Respiratory: Negative.    ?Cardiovascular: Negative.   ?Gastrointestinal: Negative.   ?Genitourinary: Negative.   ?Musculoskeletal:  Positive for joint pain.  ?Neurological: Negative.   ?Psychiatric/Behavioral: Negative.    ?All other systems reviewed and are negative. ? ?Past Medical History:  ?Diagnosis Date  ? AAA (abdominal aortic aneurysm)   ? 5 cm AAA, 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 05/2015  ? Abscess of left leg   ? Acute kidney injury superimposed on chronic kidney disease ([place]) 01/10/2018  ? Adenomatous colon polyp   ? Adrenal mass ([place])   ? 2 benign appearing left adrenal adenomas noted on 01/13/15 CT  ? Allergy   ? seasonal  ? Aortic stenosis   ? mild to moderate by echo and cath in 09/2016  ? Arthritis   ? Carotid artery occlusion   ? Cataract   ? Chronic back  pain   ? COPD (chronic obstructive pulmonary disease) ([place])   ? Coronary artery disease   ? CVA (cerebral infarction)   ? Degeneration of cervical intervertebral disc   ? Diverticulosis   ? History of back surgery   ? Rods and Screws in back  ? History of left knee replacement   ? Hypertension   ? Meningioma ([place])   ? Nocturia   ? Peripheral vascular disease ([place])   ? Ringing in ear   ? (SLIGHT)  ? Ruptured abdominal aortic aneurysm (AAA) 07/01/2015  ? Formatting of this note might be different from the original. Overview:  Last evaluation by ultrasounds with diameter of 5 cm follow-up by Dr. Oneida Alar last seen in June 10, 2015 Formatting of this note might be different from the original. Last evaluation by ultrasounds with diameter of 5 cm follow-up by Dr. Oneida Alar last seen in June 10, 2015  ? 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 s/p TAVR with a 29 mm Edwards S3U via the TF approach by Dr. Angelena Form & Dr. Cyndia Bent  ? Sleep apnea   ? wears BIPAP set on 5 to 9  ? Stroke [facility]) 2013  ? tia no residual deficit from  ? ?Past Surgical History:  ?Procedure Laterality Date  ? ABDOMINAL AORTIC ANEURYSM REPAIR  2019  ? [place]  ? APPENDECTOMY  as child  ? BACK SURGERY  2018  ? Rods and Screws in Back lower back  ? BRAIN MENINGIOMA EXCISION  1991  ? menigioma  ? CARPAL TUNNEL RELEASE    ? left  ? EYE SURGERY Bilateral   ? Cataract  ? IRRIGATION AND DEBRIDEMENT ABSCESS Left 08/25/2020  ? Procedure: IRRIGATION AND DEBRIDEMENT ABSCESS LEFT LEG;  Surgeon: Evelina Bucy, DPM;  Location: [place];  Service: Podiatry;  Laterality: Left;  ? JOINT REPLACEMENT Left 04-01-12  ? Knee  ? LEFT HEART CATH AND CORONARY ANGIOGRAPHY N/A 09/15/2016  ? Procedure: Left Heart Cath and Coronary Angiography;  Surgeon: Nelva Bush, MD;  Location: [place] CV LAB;  Service: Cardiovascular;  Laterality: N/A;  ? MAXIMUM ACCESS (MAS)POSTERIOR LUMBAR INTERBODY FUSION (PLIF) 1 LEVEL N/A 07/07/2015  ?  Procedure:  POSTERIOR LUMBAR INTERBODY FUSION (PLIF) Lumbar Four-Five with Pedicle Screw Fixation Lumbar Two-Five;Laminectomy Lumbar Two-Five;  Surgeon: Eustace Moore, MD;  Location: [facility];  Service: Neurosurgery;  Laterality: N/A;   POSTERIOR LUMBAR INTERBODY FUSION (PLIF) Lumbar Four-Five with Pedicle Screw Fixation Lumbar Two-Five;Laminectomy Lumbar Two-Five  ? RIGHT/LEFT HEART CATH AND CORONARY ANGIOGRAPHY N/A 01/24/2018  ? Procedure: RIGHT/LEFT HEART CATH AND CORONARY ANGIOGRAPHY;  Surgeon: Nelva Bush, MD;  Location: [place] CV LAB;  Service: Cardiovascular;  Laterality: N/A;  ? RIGHT/LEFT HEART CATH AND CORONARY ANGIOGRAPHY N/A 05/31/2021  ? Procedure: RIGHT/LEFT HEART CATH AND CORONARY ANGIOGRAPHY;  Surgeon: Burnell Blanks, MD;  Location: [place] CV LAB;  Service: Cardiovascular;  Laterality: N/A;  ? TEE WITHOUT CARDIOVERSION N/A 06/07/2021  ? Procedure: TRANSESOPHAGEAL ECHOCARDIOGRAM (TEE);  Surgeon: Burnell Blanks, MD;  Location: [place];  Service: Open Heart Surgery;  Laterality: N/A;  ? TONSILLECTOMY  as child  ? TOTAL KNEE ARTHROPLASTY  04/01/2012  ? Procedure: TOTAL KNEE ARTHROPLASTY;  Surgeon: Gearlean Alf, MD;  Location: [facility];  Service: Orthopedics;  Laterality: Left;  ? TRANSCATHETER AORTIC VALVE REPLACEMENT, TRANSFEMORAL Bilateral 06/07/2021  ? Procedure: TRANSCATHETER AORTIC VALVE REPLACEMENT, RIGHT TRANSFEMORAL;  Surgeon: Burnell Blanks, MD;  Location: [place];  Service: Open Heart Surgery;  Laterality: Bilateral;  ? TRANSURETHRAL RESECTION OF PROSTATE    ? ULTRASOUND GUIDANCE FOR VASCULAR ACCESS Bilateral 06/07/2021  ? Procedure: ULTRASOUND GUIDANCE FOR VASCULAR ACCESS;  Surgeon: Burnell Blanks, MD;  Location: [place];  Service: Open Heart Surgery;  Laterality: Bilateral;  ? UMBILICAL HERNIA REPAIR  2011  ? ?Social History: ?  reports that he quit smoking about 4 months ago. His smoking use included cigarettes. He has a 30.00 pack-year smoking  history. He has never used smokeless tobacco. He reports current alcohol use. He reports that he does not use drugs. ? ?Family History  ?Problem Relation Age of Onset  ? Heart disease Mother   ?     Onset ~60 y/o  ? Hypertension Mother   ?     Deceased from old age at 58  ? Hyperlipidemia Mother   ? Arthritis Mother   ? Diabetes Father   ?     Deceased from old age at 57  ? Heart attack Father   ? Heart disease Father   ?     CABG at age 38  ? Dementia Father 71  ? Arthritis Father   ? Prostate cancer Maternal Grandfather   ? Colon cancer Neg Hx   ? Esophageal cancer Neg Hx   ? Rectal cancer Neg Hx   ? Stomach cancer Neg Hx   ? ? ?Medications: ?Patient's Medications  ?  New Prescriptions  ? No medications on file  ?Previous Medications  ? ACETAMINOPHEN (TYLENOL) 650 MG CR TABLET    Take 1,300 mg by mouth at bedtime.  ? ALBUTEROL (VENTOLIN HFA) 108 (90 BASE) MCG/ACT INHALER    Inhale 2 puffs into the lungs every 6 (six) hours as needed for wheezing or shortness of breath.  ? AMOXICILLIN (AMOXIL) 500 MG TABLET    Take 4 tablets (2,000 mg total) by mouth as directed. 1 HOUR PRIOR TO DENTAL PROCEDURES  ? ASPIRIN 81 MG EC TABLET    Take 1 tablet (81 mg total) by mouth daily. Swallow whole.  ? ATORVASTATIN (LIPITOR) 40 MG TABLET    Take 1 tablet (40 mg total) by mouth daily.  ? CHOLECALCIFEROL (D-3-5) 125 MCG (5000 UT) CAPSULE    Take 5,000 Units by mouth every morning.  ? CIPROFLOXACIN (CIPRO) 500 MG TABLET    Take 1 tablet (500 mg total) by mouth 2 (two) times daily.  ? FINASTERIDE (PROSCAR) 5 MG TABLET    Take 1 tablet (5 mg total) by mouth daily.  ? FLUTICASONE FUROATE-VILANTEROL (BREO ELLIPTA) 200-25 MCG/ACT AEPB    Inhale 1 puff into the lungs daily.  ? FUROSEMIDE (LASIX) 40 MG TABLET    Take 1 tablet (40 mg total) by mouth as needed.  ? GABAPENTIN (NEURONTIN) 300 MG CAPSULE    Take 1 capsule (300 mg total) by mouth 2 (two) times daily.  ? GUAIFENESIN 200 MG TABLET    Take 2 tablets (400 mg total) by mouth 2 (two)  times daily.  ? KETOCONAZOLE (NIZORAL) 2 % CREAM    Apply 1 fingertip amount to each foot daily.  ? LOPERAMIDE (IMODIUM) 2 MG CAPSULE    Take 2 mg by mouth daily as needed for diarrhea or loose stools.  ? METHOCA

## 2021-09-26 ENCOUNTER — Ambulatory Visit: Payer: Medicare Other | Admitting: Podiatry

## 2021-09-27 ENCOUNTER — Telehealth: Payer: Self-pay

## 2021-09-27 MED ORDER — MIRABEGRON ER 25 MG PO TB24: 25.0000 mg | ORAL_TABLET | Freq: Every morning | ORAL | 0 refills | Status: DC

## 2021-09-27 NOTE — Telephone Encounter (Signed)
Patient's wife came in for Myrbetriq samples. She was given 2 boxes of samples. ?

## 2021-09-30 ENCOUNTER — Encounter: Payer: Self-pay | Admitting: Sports Medicine

## 2021-09-30 ENCOUNTER — Ambulatory Visit (INDEPENDENT_AMBULATORY_CARE_PROVIDER_SITE_OTHER): Payer: Medicare Other | Admitting: Sports Medicine

## 2021-09-30 DIAGNOSIS — M79675 Pain in left toe(s): Secondary | ICD-10-CM

## 2021-09-30 DIAGNOSIS — D689 Coagulation defect, unspecified: Secondary | ICD-10-CM

## 2021-09-30 DIAGNOSIS — I739 Peripheral vascular disease, unspecified: Secondary | ICD-10-CM

## 2021-09-30 DIAGNOSIS — B351 Tinea unguium: Secondary | ICD-10-CM

## 2021-09-30 DIAGNOSIS — M79674 Pain in right toe(s): Secondary | ICD-10-CM | POA: Diagnosis not present

## 2021-09-30 DIAGNOSIS — B353 Tinea pedis: Secondary | ICD-10-CM

## 2021-09-30 NOTE — Progress Notes (Signed)
Subjective: ?Kyle Lewis is a 76 y.o. male patient seen today in office with complaint of mildly painful thickened and elongated toenails; unable to trim. Patient denies history of Diabetes, Neuropathy, or Vascular disease.  Patient has a history of tinea and states that it is better has not been using ketoconazole cream lately. Patient has no other pedal complaints at this time.  ? ?Patient Active Problem List  ? Diagnosis Date Noted  ? 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 Mitral regurgitation 06/02/2021  ? Acute on chronic diastolic heart failure ([place])   ? Acute respiratory failure with hypoxia and hypercapnia ([place]) 05/27/2021  ? Anxiety 04/08/2020  ? Body mass index (BMI) 34.0-34.9, adult 04/07/2020  ? Abnormal gait 07/31/2019  ? Inflammation of sacroiliac joint ([place]) 05/06/2019  ? COPD GOLD II 04/07/2019  ? Neck pain 03/25/2019  ? Preop cardiovascular exam 10/24/2018  ? Cigarette smoker 09/05/2018  ? CAD (coronary artery disease) 02/01/2018  ? Severe aortic stenosis 02/01/2018  ? Status post AAA (abdominal aortic aneurysm) repair 02/01/2018  ? Pre-procedure lab exam 01/19/2018  ? Hyperlipidemia LDL goal <70 01/19/2018  ? History of craniotomy 01/11/2018  ? Blister of multiple sites of lower extremity 01/10/2018  ? Other specified postprocedural states 09/25/2017  ? Osteoarthritis of right glenohumeral joint 08/14/2017  ? Lower urinary tract symptoms (LUTS) 08/06/2017  ? S/P lumbar spinal fusion 07/07/2015  ? Iliac artery aneurysm, left ([place]) 07/01/2015  ? Peripheral vascular disease ([place]) 07/01/2015  ? Degeneration of lumbar intervertebral disc 06/14/2015  ? History of right MCA stroke 07/21/2014  ? OAB (overactive bladder) 01/19/2014  ? Stroke ([place]) 01/01/2014  ? Exogenous obesity 11/08/2013  ? 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  ? Microscopic hematuria 11/08/2013  ? Tobacco use 11/08/2013  ? Nocturia 11/08/2013  ? Ventral hernia 11/08/2013  ?  Carotid arterial disease ([place]) 05/23/2012  ? Hypertension   ? OSA (obstructive sleep apnea)   ? Chronic back pain   ? BPH with obstruction/lower urinary tract symptoms   ? OA (osteoarthritis) of knee 04/01/2012  ? Depression 03/17/2010  ? DIVERTICULOSIS, COLON 03/17/2010  ? COLONIC POLYPS, ADENOMATOUS, HX OF 03/17/2010  ? ? ?Current Outpatient Medications on File Prior to Visit  ?Medication Sig Dispense Refill  ? acetaminophen (TYLENOL) 650 MG CR tablet Take 1,300 mg by mouth at bedtime.    ? albuterol (VENTOLIN HFA) 108 (90 Base) MCG/ACT inhaler Inhale 2 puffs into the lungs every 6 (six) hours as needed for wheezing or shortness of breath. 18 g 2  ? amoxicillin (AMOXIL) 500 MG tablet Take 4 tablets (2,000 mg total) by mouth as directed. 1 HOUR PRIOR TO DENTAL PROCEDURES 8 tablet 6  ? aspirin 81 MG EC tablet Take 1 tablet (81 mg total) by mouth daily. Swallow whole. 30 tablet 11  ? atorvastatin (LIPITOR) 40 MG tablet Take 1 tablet (40 mg total) by mouth daily. 90 tablet 2  ? Cholecalciferol (D-3-5) 125 MCG (5000 UT) capsule Take 5,000 Units by mouth every morning.    ? ciprofloxacin (CIPRO) 500 MG tablet Take 1 tablet (500 mg total) by mouth 2 (two) times daily. 14 tablet 0  ? finasteride (PROSCAR) 5 MG tablet Take 1 tablet (5 mg total) by mouth daily. 90 tablet 1  ? fluticasone furoate-vilanterol (BREO ELLIPTA) 200-25 MCG/ACT AEPB Inhale 1 puff into the lungs daily. 60 each 0  ? furosemide (LASIX) 40 MG tablet Take 1 tablet (40 mg total) by mouth as needed.  90 tablet 3  ? gabapentin (NEURONTIN) 300 MG capsule Take 1 capsule (300 mg total) by mouth 2 (two) times daily. 90 capsule 1  ? guaiFENesin 200 MG tablet Take 2 tablets (400 mg total) by mouth 2 (two) times daily. 90 tablet 0  ? ketoconazole (NIZORAL) 2 % cream Apply 1 fingertip amount to each foot daily. 30 g 0  ? loperamide (IMODIUM) 2 MG capsule Take 2 mg by mouth daily as needed for diarrhea or loose stools.    ? methocarbamol (ROBAXIN) 500 MG tablet Take  1 tablet (500 mg total) by mouth at bedtime as needed for muscle spasms. 30 tablet 0  ? metoprolol tartrate (LOPRESSOR) 25 MG tablet Take 1/2 tablet (12.5 mg total) by mouth 2 (two) times daily. 60 tablet 0  ? mirabegron ER (MYRBETRIQ) 25 MG TB24 tablet Take 1 tablet (25 mg total) by mouth every morning. 14 tablet 0  ? nicotine (NICODERM CQ - DOSED IN MG/24 HOURS) 21 mg/24hr patch Place 1 patch (21 mg total) onto the skin daily as needed. 28 patch 0  ? olmesartan (BENICAR) 40 MG tablet Take 1 tablet (40 mg total) by mouth daily. Please hold this medication, please discuss with your cardiology when should you restart this medication, 90 tablet 1  ? PRESCRIPTION MEDICATION Inhale into the lungs at bedtime. BIPAP    ? sertraline (ZOLOFT) 50 MG tablet Take 1 tablet (50 mg total) by mouth daily. 90 tablet 3  ? tamsulosin (FLOMAX) 0.4 MG CAPS capsule TAKE 1 CAPSULE BY MOUTH ONCE DAILY AFTER SUPPER 90 capsule 3  ? vitamin B-12 (CYANOCOBALAMIN) 1000 MCG tablet Take 1,000 mcg by mouth every morning.    ? ?No current facility-administered medications on file prior to visit.  ? ? ?Allergies  ?Allergen Reactions  ? Cefazolin Rash and Other (See Comments)  ?  The patient had surgery and was given cefazolin intraop. ~ 10 days later he developed a rash confirmed by biopsy to be consistent w/ drug eruption. We cannot know for sure, but this is the most likely agent.  ?  ? ? ?Objective: ?Physical Exam ? ?General: Well developed, nourished, no acute distress, awake, alert and oriented x 3 ? ?Vascular: Dorsalis pedis artery 1/4 bilateral, Posterior tibial artery 0/4 bilateral, skin temperature warm to warm proximal to distal bilateral lower extremities, no varicosities, pedal hair present bilateral. ? ?Neurological: Gross sensation present via light touch bilateral.  ? ?Dermatological: Skin is warm, dry, and supple bilateral, Nails 1-10 are tender, long, thick, and discolored with mild subungal debris, no webspace macerations present  bilateral, no open lesions present bilateral, no callus/corns/hyperkeratotic tissue present bilateral.  Previous tinea now resolved.  No signs of infection bilateral. ? ?Musculoskeletal: Asymptomatic hammertoe and prominent first and fifth metatarsal heads boney deformities noted bilateral. Muscular strength within normal limits without painon range of motion. No pain with calf compression bilateral. ? ?Assessment and Plan:  ?Problem List Items Addressed This Visit   ? ?  ? Cardiovascular and Mediastinum  ? Peripheral vascular disease ([place])  ? ?Other Visit Diagnoses   ? ? Pain due to onychomycosis of toenails of both feet    -  Primary  ? Coagulation defect ([place])      ? Tinea pedis of both feet      ? ?  ? ? ?-Examined patient.  ?-Discussed treatment options for painful mycotic nails. ?-Mechanically debrided and reduced all painful mycotic nails with sterile nail nipper and dremel nail file without incident. ?-Advised  patient if symptoms obtaining recur to resume using ketoconazole cream of which she has at home ?-Continue with good supportive shoes daily for foot type ?-Patient to return in 3 months for follow up evaluation or sooner if symptoms worsen. ? ?Landis Martins, DPM ? ?

## 2021-10-13 NOTE — Progress Notes (Signed)
[facility]- Preparing for Total Shoulder Arthroplasty  °  °Before surgery, you can play an important role. Because skin is not sterile, your skin needs to be as free of germs as possible. You can reduce the number of germs on your skin by using the following products. °Benzoyl Peroxide Gel °Reduces the number of germs present on the skin °Applied twice a day to shoulder area starting two days before surgery   ° °================================================================== ° °Please follow these instructions carefully: ° °BENZOYL PEROXIDE 5% GEL ° °Please do not use if you have an allergy to benzoyl peroxide.   If your skin becomes reddened/irritated stop using the benzoyl peroxide. ° °Starting two days before surgery, apply as follows: °Apply benzoyl peroxide in the morning and at night. Apply after taking a shower. If you are not taking a shower clean entire shoulder front, back, and side along with the armpit with a clean wet washcloth. ° °Place a quarter-sized dollop on your shoulder and rub in thoroughly, making sure to cover the front, back, and side of your shoulder, along with the armpit.  ° °2 days before ____ AM   ____ PM              1 day before ____ AM   ____ PM °                        °Do this twice a day for two days.  (Last application is the night before surgery, AFTER using the CHG soap as described below). ° °Do NOT apply benzoyl peroxide gel on the day of surgery.  °

## 2021-10-13 NOTE — Progress Notes (Addendum)
DUE TO COVID-19 ONLY  2  VISITOR IS ALLOWED TO COME WITH YOU AND STAY IN THE WAITING ROOM ONLY DURING PRE OP AND PROCEDURE DAY OF SURGERY.   4  VISITOR  MAY VISIT WITH YOU AFTER SURGERY IN YOUR PRIVATE ROOM DURING VISITING HOURS ONLY! ?YOU MAY HAVE ONE PERSON SPEND THE NITE WITH YOU IN YOUR ROOM AFTER SURGERY.   ? ? Your procedure is scheduled on:  ?  10/27/21  ? Report to [facility] Main  Entrance ? ? Report to admitting at    0715            AM ?DO NOT Daniel, PICTURE ID OR WALLET DAY OF SURGERY.  ?  ? ? Call this number if you have problems the morning of surgery [phone]  ? ? REMEMBER: NO  SOLID FOODS , CANDY, GUM OR MINTS AFTER MIDNITE THE NITE BEFORE SURGERY .       Marland Kitchen CLEAR LIQUIDS UNTIL    0700am             DAY OF SURGERY.      PLEASE FINISH ENSURE DRINK PER SURGEON ORDER  WHICH NEEDS TO BE COMPLETED AT    0700am        MORNING OF SURGERY.   ? ? ? ? ?CLEAR LIQUID DIET ? ? ?Foods Allowed      ?WATER ?BLACK COFFEE ( SUGAR OK, NO MILK, CREAM OR CREAMER) REGULAR AND DECAF  ?TEA ( SUGAR OK NO MILK, CREAM, OR CREAMER) REGULAR AND DECAF  ?PLAIN JELLO ( NO RED)  ?FRUIT ICES ( NO RED, NO FRUIT PULP)  ?POPSICLES ( NO RED)  ?JUICE- APPLE, WHITE GRAPE AND WHITE CRANBERRY  ?SPORT DRINK LIKE GATORADE ( NO RED)  ?CLEAR BROTH ( VEGETABLE , CHICKEN OR BEEF)                                                               ? ?    ? ?BRUSH YOUR TEETH MORNING OF SURGERY AND RINSE YOUR MOUTH OUT, NO CHEWING GUM CANDY OR MINTS. ?  ? ? Take these medicines the morning of surgery with A SIP OF WATER:, proscar,   ? ? ?DO NOT TAKE ANY DIABETIC MEDICATIONS DAY OF YOUR SURGERY ?                  ?            You may not have any metal on your body including hair pins and  ?            piercings  Do not wear jewelry, make-up, lotions, powders or perfumes, deodorant ?            Do not wear nail polish on your fingernails.   ?           IF YOU ARE A MALE AND WANT TO SHAVE UNDER ARMS OR LEGS PRIOR TO SURGERY YOU  MUST DO SO AT LEAST 48 HOURS PRIOR TO SURGERY.  ?            Men may shave face and neck. ? ? Do not bring valuables to the hospital. [place] NOT ?            RESPONSIBLE  FOR VALUABLES. ? Contacts, dentures or bridgework may not be worn into surgery. ? Leave suitcase in the car. After surgery it may be brought to your room. ? ?  ? Patients discharged the day of surgery will not be allowed to drive home. IF YOU ARE HAVING SURGERY AND GOING HOME THE SAME DAY, YOU MUST HAVE AN ADULT TO DRIVE YOU HOME AND BE WITH YOU FOR 24 HOURS. YOU MAY GO HOME BY TAXI OR UBER OR ORTHERWISE, BUT AN ADULT MUST ACCOMPANY YOU HOME AND STAY WITH YOU FOR 24 HOURS. ?  ? ?            Please read over the following fact sheets you were given: ?_____________________________________________________________________ ? ?[place] - Preparing for Surgery ?Before surgery, you can play an important role.  Because skin is not sterile, your skin needs to be as free of germs as possible.  You can reduce the number of germs on your skin by washing with CHG (chlorahexidine gluconate) soap before surgery.  CHG is an antiseptic cleaner which kills germs and bonds with the skin to continue killing germs even after washing. ?Please DO NOT use if you have an allergy to CHG or antibacterial soaps.  If your skin becomes reddened/irritated stop using the CHG and inform your nurse when you arrive at Short Stay. ?Do not shave (including legs and underarms) for at least 48 hours prior to the first CHG shower.  You may shave your face/neck. ?Please follow these instructions carefully: ? 1.  Shower with CHG Soap the night before surgery and the  morning of Surgery. ? 2.  If you choose to wash your hair, wash your hair first as usual with your  normal  shampoo. ? 3.  After you shampoo, rinse your hair and body thoroughly to remove the  shampoo.                           4.  Use CHG as you would any other liquid soap.  You can apply chg directly  to the skin  and wash  ?                     Gently with a scrungie or clean washcloth. ? 5.  Apply the CHG Soap to your body ONLY FROM THE NECK DOWN.   Do not use on face/ open      ?                     Wound or open sores. Avoid contact with eyes, ears mouth and genitals (private parts).  ?                     Production manager,  Genitals (private parts) with your normal soap. ?            6.  Wash thoroughly, paying special attention to the area where your surgery  will be performed. ? 7.  Thoroughly rinse your body with warm water from the neck down. ? 8.  DO NOT shower/wash with your normal soap after using and rinsing off  the CHG Soap. ?               9.  Pat yourself dry with a clean towel. ?           10.  Wear clean pajamas. ?  11.  Place clean sheets on your bed the night of your first shower and do not  sleep with pets. ?Day of Surgery : ?Do not apply any lotions/deodorants the morning of surgery.  Please wear clean clothes to the hospital/surgery center. ? ?FAILURE TO FOLLOW THESE INSTRUCTIONS MAY RESULT IN THE CANCELLATION OF YOUR SURGERY ?PATIENT SIGNATURE_________________________________ ? ?NURSE SIGNATURE__________________________________ ? ?________________________________________________________________________  ? ? ?           ?

## 2021-10-13 NOTE — Progress Notes (Addendum)
Anesthesia Review: ? ?PCP: DR  [name]- [facility] 09/13/21  ?Cardiologist : Clearance Coletta Memos , NP 08/19/21  Dr Heidi Dach - [facility]  Ardeen Garland- 07/13/21  ? DR Cyndia Bent- surgeon  ? ?Pulm- Dr wert- [facility] 06/23/2021  ?Chest x-ray : 06/06/21  ?Carotids- 06/01/21  ?EKG :06/15/21  ?Echo : 07/15/21  ?Ct Cors- 06/01/21  ?Stress test: ?Cardiac Cath :  05/31/21  ?Activity level: can not do a flight of stairs without difficulty  ?Sleep Study/ CPAP : has bipap  ?Fasting Blood Sugar :      / Checks Blood Sugar -- times a day:   ?Blood Thinner/ Instructions /Last Dose: ?ASA / Instructions/ Last Dose :   ?81 mg Aspirin  ?05/27/21- [facility] Admission  ?

## 2021-10-17 ENCOUNTER — Encounter (HOSPITAL_COMMUNITY): Payer: Self-pay

## 2021-10-17 ENCOUNTER — Encounter (HOSPITAL_COMMUNITY)
Admission: RE | Admit: 2021-10-17 | Discharge: 2021-10-17 | Disposition: A | Discharge status: Home / Self Care | Payer: Medicare Other | Source: Ambulatory Visit | Attending: Orthopedic Surgery | Admitting: Orthopedic Surgery

## 2021-10-17 ENCOUNTER — Other Ambulatory Visit: Payer: Self-pay

## 2021-10-17 VITALS — BP 122/71 | HR 58 | Temp 98.0°F | Resp 16 | Ht 68.0 in

## 2021-10-17 DIAGNOSIS — Z9981 Dependence on supplemental oxygen: Secondary | ICD-10-CM | POA: Insufficient documentation

## 2021-10-17 DIAGNOSIS — Z87891 Personal history of nicotine dependence: Secondary | ICD-10-CM | POA: Diagnosis not present

## 2021-10-17 DIAGNOSIS — Z952 Presence of prosthetic heart valve: Secondary | ICD-10-CM | POA: Diagnosis not present

## 2021-10-17 DIAGNOSIS — Z01818 Encounter for other preprocedural examination: Secondary | ICD-10-CM | POA: Insufficient documentation

## 2021-10-17 DIAGNOSIS — N189 Chronic kidney disease, unspecified: Secondary | ICD-10-CM | POA: Diagnosis not present

## 2021-10-17 DIAGNOSIS — M75102 Unspecified rotator cuff tear or rupture of left shoulder, not specified as traumatic: Secondary | ICD-10-CM | POA: Insufficient documentation

## 2021-10-17 DIAGNOSIS — J449 Chronic obstructive pulmonary disease, unspecified: Secondary | ICD-10-CM | POA: Diagnosis not present

## 2021-10-17 DIAGNOSIS — I129 Hypertensive chronic kidney disease with stage 1 through stage 4 chronic kidney disease, or unspecified chronic kidney disease: Secondary | ICD-10-CM | POA: Diagnosis not present

## 2021-10-17 HISTORY — DX: Dyspnea, unspecified: R06.00

## 2021-10-17 HISTORY — DX: Cardiac murmur, unspecified: R01.1

## 2021-10-17 LAB — BASIC METABOLIC PANEL
Anion gap: 8 (ref 5–15)
BUN: 26 mg/dL — ABNORMAL HIGH (ref 8–23)
CO2: 30 mmol/L (ref 22–32)
Calcium: 9 mg/dL (ref 8.9–10.3)
Chloride: 104 mmol/L (ref 98–111)
Creatinine, Ser: 0.73 mg/dL (ref 0.61–1.24)
GFR, Estimated: >60 mL/min (ref >60)
Glucose, Bld: 110 mg/dL — ABNORMAL HIGH (ref 70–99)
Potassium: 4.5 mmol/L (ref 3.5–5.1)
Sodium: 142 mmol/L (ref 135–145)

## 2021-10-17 LAB — CBC
HCT: 44.9 % (ref 39.0–52.0)
Hemoglobin: 15 g/dL (ref 13.0–17.0)
MCH: 31.5 pg (ref 26.0–34.0)
MCHC: 33.4 g/dL (ref 30.0–36.0)
MCV: 94.3 fL (ref 80.0–100.0)
Platelets: 154 10*3/uL (ref 150–400)
RBC: 4.76 MIL/uL (ref 4.22–5.81)
RDW: 13.4 % (ref 11.5–15.5)
WBC: 7 10*3/uL (ref 4.0–10.5)
nRBC: 0 % (ref 0.0–0.2)

## 2021-10-17 LAB — SURGICAL PCR SCREEN
MRSA, PCR: NEGATIVE
Staphylococcus aureus: NEGATIVE

## 2021-10-18 ENCOUNTER — Encounter (HOSPITAL_COMMUNITY): Payer: Self-pay | Admitting: Physician Assistant

## 2021-10-18 ENCOUNTER — Encounter: Payer: Self-pay | Admitting: Gastroenterology

## 2021-10-18 NOTE — Anesthesia Preprocedure Evaluation (Deleted)
Anesthesia Evaluation    Airway        Dental   Pulmonary former smoker,           Cardiovascular hypertension,      Neuro/Psych    GI/Hepatic   Endo/Other    Renal/GU      Musculoskeletal   Abdominal   Peds  Hematology   Anesthesia Other Findings   Reproductive/Obstetrics                             Anesthesia Physical Anesthesia Plan  ASA:   Anesthesia Plan:    Post-op Pain Management:    Induction:   PONV Risk Score and Plan:   Airway Management Planned:   Additional Equipment:   Intra-op Plan:   Post-operative Plan:   Informed Consent:   Plan Discussed with:   Anesthesia Plan Comments: (See PAT note 10/17/2021)        Anesthesia Quick Evaluation

## 2021-10-18 NOTE — Progress Notes (Signed)
Anesthesia Chart Review ? ? Case: [id number] Date/Time: 10/27/21 0947  ? Procedure: REVERSE SHOULDER ARTHROPLASTY (Left: Shoulder)  ? Anesthesia type: General  ? Pre-op diagnosis: Left shoulder rotator cuff tear arthropathy  ? Location: [facility]OM 06 / [facility]  ? Surgeons: Justice Britain, MD  ? ?  ? ? ?DISCUSSION:76 y.o. 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 Justice Britain.  ? ?Per cardiology preoperative evaluation 08/19/2021, "Chart reviewed as part of pre-operative protocol coverage. Given past medical history and time since last visit, based on ACC/AHA guidelines, LIBAN GUEDES would be at acceptable risk for the planned procedure without further cardiovascular testing.  ?His aspirin will need to be continued throughout his procedure. ?His RCRI is a class IV risk, 11% risk of major cardiac event." ? ?Anticipate pt can proceed with planned procedure barring acute status change and after evaluation DOS.  ?VS: BP 122/71   Pulse (!) 58   Temp 36.7 ?C (Oral)   Resp 16   Ht '5\' 8"'$  (1.727 m)   SpO2 98%   BMI 33.75 kg/m?  ? ?PROVIDERS: ?[name], MD ? ?Primary Cardiologist: Jenean Lindau, MD ?LABS: Labs reviewed: Acceptable for surgery. ?(all labs ordered are listed, but only abnormal results are displayed) ? ?Labs Reviewed  ?BASIC METABOLIC PANEL - Abnormal; Notable for the following components:  ?    Result Value  ? Glucose, Bld 110 (*)   ? BUN 26 (*)   ? All other components within normal limits  ?SURGICAL PCR SCREEN  ?CBC  ? ? ? ?IMAGES: ?VAS US Carotid 06/01/2021 ?Summary:  ?Right Carotid: Evidence consistent with a total occlusion of the right  ?ICA.  ? ?Left Carotid: Velocities in the left ICA are consistent with a 40-59%  ?stenosis.   ? ?EKG: ?06/15/2021  ?Rate 58 bpm  ?Sinus bradycardia  ? ?CV: ?Echo 07/15/2021 ?1. Left ventricular ejection fraction, by estimation, is 60 to 65%. The  ?left ventricle has normal function. The left ventricle has no regional  ?wall motion abnormalities. There is mild concentric left ventricular  ?hypertrophy. Left ventricular diastolic  ?parameters are consistent with Grade I diastolic dysfunction (impaired  ?relaxation). The average left ventricular global longitudinal strain is  ?-15.8 %.  ? 2. Right ventricular systolic function is normal. The right ventricular  ?size is normal.  ? 3. The mitral valve is normal in structure. No evidence of mitral valve  ?regurgitation. No evidence of mitral stenosis.  ? 4. Aortic valve regurgitation is not visualized. No aortic stenosis is  ?present. There is a 29 mm Edwards 29 mm Edwards S3U valve present in the  ?aortic position. Procedure Date: 06/07/2021.  ? 5. The inferior vena cava is normal in size with greater than 50%  ?respiratory variability, suggesting right atrial pressure of 3 mmHg.  ?Past Medical History:  ?Diagnosis Date  ? AAA (abdominal aortic aneurysm) ([place])   ? 5 cm AAA, 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 05/2015  ? Abscess of left leg   ? Acute kidney injury superimposed on chronic kidney disease ([place]) 01/10/2018  ? Adenomatous colon polyp   ? Adrenal mass ([place])   ? 2 benign appearing left adrenal adenomas noted on 01/13/15 CT  ? Allergy   ? seasonal  ? Aortic stenosis   ?  mild to moderate by echo and cath in 09/2016  ? Arthritis   ? Carotid artery occlusion   ? Cataract   ? Chronic back pain   ? COPD (chronic obstructive pulmonary disease) ([place])   ? Coronary artery disease   ? CVA (cerebral infarction)   ? Degeneration of cervical intervertebral disc   ? Diverticulosis   ? Dyspnea   ? Heart murmur   ? History of back surgery   ? Rods and Screws in back  ? History of left knee replacement   ? Hypertension   ? Meningioma ([place])   ? Nocturia   ? Peripheral vascular disease ([place])    ? Ringing in ear   ? (SLIGHT)  ? Ruptured abdominal aortic aneurysm (AAA) ([place]) 07/01/2015  ? Formatting of this note might be different from the original. Overview:  Last evaluation by ultrasounds with diameter of 5 cm follow-up by Dr. Oneida Alar last seen in June 10, 2015 Formatting of this note might be different from the original. Last evaluation by ultrasounds with diameter of 5 cm follow-up by Dr. Oneida Alar last seen in June 10, 2015  ? 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 s/p TAVR with a 29 mm Edwards S3U via the TF approach by Dr. Angelena Form & Dr. Cyndia Bent  ? Sleep apnea   ? wears BIPAP set on 5 to 9  ? Stroke [facility]) 2013  ? tia no residual deficit from  ? ? ?Past Surgical History:  ?Procedure Laterality Date  ? ABDOMINAL AORTIC ANEURYSM REPAIR  2019  ? [place]  ? APPENDECTOMY  as child  ? BACK SURGERY  2018  ? Rods and Screws in Back lower back  ? BRAIN MENINGIOMA EXCISION  1991  ? menigioma  ? CARPAL TUNNEL RELEASE    ? left  ? EYE SURGERY Bilateral   ? Cataract  ? IRRIGATION AND DEBRIDEMENT ABSCESS Left 08/25/2020  ? Procedure: IRRIGATION AND DEBRIDEMENT ABSCESS LEFT LEG;  Surgeon: Evelina Bucy, DPM;  Location: [place];  Service: Podiatry;  Laterality: Left;  ? JOINT REPLACEMENT Left 04-01-12  ? Knee  ? LEFT HEART CATH AND CORONARY ANGIOGRAPHY N/A 09/15/2016  ? Procedure: Left Heart Cath and Coronary Angiography;  Surgeon: Nelva Bush, MD;  Location: [place] CV LAB;  Service: Cardiovascular;  Laterality: N/A;  ? MAXIMUM ACCESS (MAS)POSTERIOR LUMBAR INTERBODY FUSION (PLIF) 1 LEVEL N/A 07/07/2015  ? Procedure:  POSTERIOR LUMBAR INTERBODY FUSION (PLIF) Lumbar Four-Five with Pedicle Screw Fixation Lumbar Two-Five;Laminectomy Lumbar Two-Five;  Surgeon: Eustace Moore, MD;  Location: [place] [facility];  Service: Neurosurgery;  Laterality: N/A;   POSTERIOR LUMBAR INTERBODY FUSION (PLIF) Lumbar Four-Five with Pedicle Screw Fixation Lumbar Two-Five;Laminectomy Lumbar  Two-Five  ? RIGHT/LEFT HEART CATH AND CORONARY ANGIOGRAPHY N/A 01/24/2018  ? Procedure: RIGHT/LEFT HEART CATH AND CORONARY ANGIOGRAPHY;  Surgeon: Nelva Bush, MD;  Location: [place] CV LAB;  Service: Cardiovascular;  Laterality: N/A;  ? RIGHT/LEFT HEART CATH AND CORONARY ANGIOGRAPHY N/A 05/31/2021  ? Procedure: RIGHT/LEFT HEART CATH AND CORONARY ANGIOGRAPHY;  Surgeon: Burnell Blanks, MD;  Location: [place] CV LAB;  Service: Cardiovascular;  Laterality: N/A;  ? TEE WITHOUT CARDIOVERSION N/A 06/07/2021  ? Procedure: TRANSESOPHAGEAL ECHOCARDIOGRAM (TEE);  Surgeon: Burnell Blanks, MD;  Location: [place];  Service: Open Heart Surgery;  Laterality: N/A;  ? TONSILLECTOMY  as child  ? TOTAL KNEE ARTHROPLASTY  04/01/2012  ? Procedure: TOTAL KNEE ARTHROPLASTY;  Surgeon: Gearlean Alf, MD;  Location: [facility];  Service: Orthopedics;  Laterality: Left;  ? TRANSCATHETER AORTIC VALVE REPLACEMENT, TRANSFEMORAL Bilateral 06/07/2021  ? Procedure: TRANSCATHETER AORTIC VALVE REPLACEMENT, RIGHT TRANSFEMORAL;  Surgeon: Burnell Blanks, MD;  Location: [place];  Service: Open Heart Surgery;  Laterality: Bilateral;  ? TRANSURETHRAL RESECTION OF PROSTATE    ? ULTRASOUND GUIDANCE FOR VASCULAR ACCESS Bilateral 06/07/2021  ? Procedure: ULTRASOUND GUIDANCE FOR VASCULAR ACCESS;  Surgeon: Burnell Blanks, MD;  Location: [place];  Service: Open Heart Surgery;  Laterality: Bilateral;  ? UMBILICAL HERNIA REPAIR  2011  ? ? ?MEDICATIONS: ? acetaminophen (TYLENOL) 650 MG CR tablet  ? albuterol (VENTOLIN HFA) 108 (90 Base) MCG/ACT inhaler  ? amoxicillin (AMOXIL) 500 MG tablet  ? aspirin 81 MG EC tablet  ? atorvastatin (LIPITOR) 40 MG tablet  ? ciprofloxacin (CIPRO) 500 MG tablet  ? finasteride (PROSCAR) 5 MG tablet  ? fluticasone furoate-vilanterol (BREO ELLIPTA) 200-25 MCG/ACT AEPB  ? furosemide (LASIX) 40 MG tablet  ? gabapentin (NEURONTIN) 300 MG capsule  ? guaiFENesin 200 MG tablet  ? ketoconazole (NIZORAL) 2 %  cream  ? methocarbamol (ROBAXIN) 500 MG tablet  ? metoprolol tartrate (LOPRESSOR) 25 MG tablet  ? mirabegron ER (MYRBETRIQ) 25 MG TB24 tablet  ? nicotine (NICODERM CQ - DOSED IN MG/24 HOURS) 21 mg/24hr patch  ? olmesar

## 2021-10-27 ENCOUNTER — Encounter (HOSPITAL_COMMUNITY): Admission: RE | Payer: Self-pay | Source: Ambulatory Visit

## 2021-10-27 ENCOUNTER — Ambulatory Visit (HOSPITAL_COMMUNITY): Admission: RE | Admit: 2021-10-27 | Payer: Medicare Other | Source: Ambulatory Visit | Admitting: Orthopedic Surgery

## 2021-10-27 DIAGNOSIS — R2689 Other abnormalities of gait and mobility: Secondary | ICD-10-CM

## 2021-10-27 DIAGNOSIS — M6281 Muscle weakness (generalized): Secondary | ICD-10-CM | POA: Insufficient documentation

## 2021-10-27 HISTORY — DX: Muscle weakness (generalized): M62.81

## 2021-10-27 HISTORY — DX: Other abnormalities of gait and mobility: R26.89

## 2021-10-27 SURGERY — ARTHROPLASTY, SHOULDER, TOTAL, REVERSE
Anesthesia: General | Site: Shoulder | Laterality: Left

## 2021-11-07 DIAGNOSIS — T7840XA Allergy, unspecified, initial encounter: Secondary | ICD-10-CM | POA: Insufficient documentation

## 2021-11-07 DIAGNOSIS — R011 Cardiac murmur, unspecified: Secondary | ICD-10-CM | POA: Insufficient documentation

## 2021-11-07 DIAGNOSIS — Z9889 Other specified postprocedural states: Secondary | ICD-10-CM | POA: Insufficient documentation

## 2021-11-07 DIAGNOSIS — I6529 Occlusion and stenosis of unspecified carotid artery: Secondary | ICD-10-CM | POA: Insufficient documentation

## 2021-11-07 DIAGNOSIS — R0600 Dyspnea, unspecified: Secondary | ICD-10-CM | POA: Insufficient documentation

## 2021-11-07 DIAGNOSIS — L02416 Cutaneous abscess of left lower limb: Secondary | ICD-10-CM | POA: Insufficient documentation

## 2021-11-07 DIAGNOSIS — H9319 Tinnitus, unspecified ear: Secondary | ICD-10-CM | POA: Insufficient documentation

## 2021-11-07 DIAGNOSIS — E278 Other specified disorders of adrenal gland: Secondary | ICD-10-CM | POA: Insufficient documentation

## 2021-11-07 DIAGNOSIS — K5790 Diverticulosis of intestine, part unspecified, without perforation or abscess without bleeding: Secondary | ICD-10-CM | POA: Insufficient documentation

## 2021-11-07 DIAGNOSIS — M1990 Unspecified osteoarthritis, unspecified site: Secondary | ICD-10-CM | POA: Insufficient documentation

## 2021-11-07 DIAGNOSIS — M5030 Other cervical disc degeneration, unspecified cervical region: Secondary | ICD-10-CM | POA: Insufficient documentation

## 2021-11-07 DIAGNOSIS — D329 Benign neoplasm of meninges, unspecified: Secondary | ICD-10-CM | POA: Insufficient documentation

## 2021-11-07 DIAGNOSIS — Z96652 Presence of left artificial knee joint: Secondary | ICD-10-CM | POA: Insufficient documentation

## 2021-11-07 DIAGNOSIS — I714 Abdominal aortic aneurysm, without rupture, unspecified: Secondary | ICD-10-CM | POA: Insufficient documentation

## 2021-11-08 ENCOUNTER — Encounter: Payer: Self-pay | Admitting: Family Medicine

## 2021-11-08 ENCOUNTER — Ambulatory Visit (INDEPENDENT_AMBULATORY_CARE_PROVIDER_SITE_OTHER): Payer: Medicare Other | Admitting: Family Medicine

## 2021-11-08 VITALS — BP 130/86 | HR 88 | Temp 98.0°F | Ht 68.0 in | Wt 225.0 lb

## 2021-11-08 DIAGNOSIS — I2510 Atherosclerotic heart disease of native coronary artery without angina pectoris: Secondary | ICD-10-CM | POA: Diagnosis not present

## 2021-11-08 DIAGNOSIS — M7022 Olecranon bursitis, left elbow: Secondary | ICD-10-CM | POA: Insufficient documentation

## 2021-11-08 DIAGNOSIS — M25522 Pain in left elbow: Secondary | ICD-10-CM

## 2021-11-08 HISTORY — DX: Olecranon bursitis, left elbow: M70.22

## 2021-11-08 MED ORDER — METHYLPREDNISOLONE ACETATE 80 MG/ML IJ SUSP
80.0000 mg | Freq: Once | INTRAMUSCULAR | Status: AC
  Administered 2021-11-08: 80 mg via INTRA_ARTICULAR

## 2021-11-08 NOTE — Patient Instructions (Signed)
Apply ice pack 3 times a day to prevent recurrent swelling

## 2021-11-08 NOTE — Progress Notes (Signed)
Provider:  [name], MD  Careteam: Patient Care Team: [name], MD as PCP - General (Family Medicine) Revankar, Reita Cliche, MD as PCP - Cardiology (Cardiology) Druscilla Brownie, MD as Consulting Physician (Dermatology) Garvin Fila, MD as Consulting Physician (Neurology)  PLACE OF SERVICE:  [place]  Advanced Directive information    Allergies  Allergen Reactions   Cefazolin Rash and Other (See Comments)    The patient had surgery and was given cefazolin intraop. ~ 10 days later he developed a rash confirmed by biopsy to be consistent w/ drug eruption. We cannot know for sure, but this is the most likely agent.      No chief complaint on file.    HPI: Patient is a 76 y.o. male patient presents with soft tissue swelling over the tip of the left elbow.  He does not recall any trauma or other injury to the elbow.  Swelling and location are consistent with olecranon bursitis  Review of Systems:  Review of Systems  Constitutional: Negative.   HENT: Negative.    Respiratory: Negative.    Cardiovascular: Negative.   Gastrointestinal: Negative.   Musculoskeletal: Negative.   Skin: Negative.   Neurological: Negative.   Psychiatric/Behavioral: Negative.    All other systems reviewed and are negative.  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   Abnormal gait 07/31/2019   Abscess of left leg    Acute on chronic diastolic heart failure (HCC)    Acute respiratory failure with hypoxia and hypercapnia ([place]) 05/27/2021   HC03   06/14/21    =  31  - 06/23/2021   Walked 250 ft  at a slow pace with a cane. Complained of back and left knee hurting with lowest sat 89% so d/c'd 02    Adenomatous colon polyp    Adrenal mass ([place])    2 benign appearing left adrenal adenomas noted on 01/13/15 CT   Allergy    seasonal   Anxiety 04/08/2020   Arthritis    Blister of multiple sites of lower  extremity 01/10/2018   Body mass index (BMI) 34.0-34.9, adult 04/07/2020   BPH with obstruction/lower urinary tract symptoms    Overview:  Probable based on symptoms   CAD (coronary artery disease) 02/01/2018   Carotid arterial disease ([place]) 05/23/2012   Carotid artery occlusion    Chronic back pain    Cigarette smoker 09/05/2018   COLONIC POLYPS, ADENOMATOUS, HX OF 03/17/2010   Qualifier: Diagnosis of  By: Harlon Ditty CMA (AAMA), Dott[place]     COPD GOLD II 04/07/2019   Quit smoking 03/2019 - PFT's  04/07/2019  FEV1 1.58 (57 % ) ratio 0.54  p 0 % improvement from saba p nothing prior to study with DLCO  15.22 (66%) corrects to 3.05 (74%)  for alv volume and FV curve concave classically     Degeneration of cervical intervertebral disc    Degeneration of lumbar intervertebral disc 06/14/2015   Depression 03/17/2010   Qualifier: Diagnosis of  By: Nelson-Smith CMA (AAMA), Dottie     Diverticulosis    DIVERTICULOSIS, COLON 03/17/2010   Qualifier: Diagnosis of  By: Harlon Ditty CMA (AAMA), Dottie     Dyspnea    Exogenous obesity 11/08/2013   Overview:  With a nine pound weight gain since his last visit   Heart murmur    History of back surgery    Rods  and Screws in back   History of craniotomy 01/11/2018   History of left knee replacement    History of right MCA stroke 07/21/2014   Hyperlipidemia LDL goal <70 01/19/2018   Hypertension    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 Oneida Alar and 06/10/2015 last visit   Impairment of balance 10/27/2021   Inflammation of sacroiliac joint ([place]) 05/06/2019   Lower urinary tract symptoms (LUTS) 08/06/2017   Meningioma ([place])    Microscopic hematuria 11/08/2013   Mitral regurgitation 06/02/2021   Muscle weakness 10/27/2021   Neck pain 03/25/2019   Nocturia    OA (osteoarthritis) of knee 04/01/2012   OAB (overactive bladder) 01/19/2014   OSA (obstructive sleep apnea)     Bipap per [place] since ? 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 MAngelena Form& Dr. BCyndia Bent  Severe aortic stenosis 02/01/2018   ECHO 01/30/18 - Left ventricle: The cavity size was normal. There was moderate   focal basal hypertrophy of the septum. Systolic function was   vigorous. The estimated ejection fraction was in the range of 65%   to 70%. Wall motion was normal; there were no regional wall   motion abnormalities. Doppler parameters are consistent with   abnormal left ventricular relaxation (grade 1 diastolic   dysfun   Status post AAA (abdominal aortic aneurysm) repair 02/01/2018   Stroke ([facility] 2013   tia no residual deficit from   Tobacco use 11/08/2013   Ventral hernia 11/08/2013   Overview:  S/p repair with alloderm mesh and s/p SB resection   Past Surgical History:  Procedure Laterality Date   ABDOMINAL AORTIC ANEURYSM REPAIR  2019   [place]ower as child   BACK SURGERY  2018   Rods and Screws in Back lower back   BRAIN MENINGIOMA EXCISION  1991   menigioma   CARPAL TUNNEL RELEASE     left   EYE SURGERY Bilateral    Cataract   IRRIGATION AND DEBRIDEMENT ABSCESS Left 08/25/2020   Procedure: IRRIGATION AND DEBRIDEMENT ABSCESS LEFT LEG;  Surgeon: PEvelina Bucy DPM;  [facility]  Service: Podiatry;  Laterality: Left;   JOINT REPLACEMENT Left 04-01-12   Knee   LEFT HEART CATH AND CORONARY ANGIOGRAPHY N/A 09/15/2016   Procedure: Left Heart Cath and Coronary Angiography;   Surgeon: CNelva Bush MD;  [facility];  Service: Cardiovascular;  Laterality: N/A;   MAXIMUM ACCESS (MAS)POSTERIOR LUMBAR INTERBODY FUSION (PLIF) 1 LEVEL N/A 07/07/2015   Procedure:  POSTERIOR LUMBAR INTERBODY FUSION (PLIF) Lumbar Four-Five with Pedicle Screw Fixation Lumbar Two-Five;Laminectomy Lumbar Two-Five;  Surgeon: DEustace Moore MD;  [facility];  Service: Neurosurgery;  Laterality: N/A;   POSTERIOR LUMBAR INTERBODY FUSION (PLIF) Lumbar Four-Five with Pedicle Screw Fixation Lumbar Two-Five;Laminectomy Lumbar Two-Five   RIGHT/LEFT  HEART CATH AND CORONARY ANGIOGRAPHY N/A 01/24/2018   Procedure: RIGHT/LEFT HEART CATH AND CORONARY ANGIOGRAPHY;  Surgeon: Nelva Bush, MD;  Location: [place] CV LAB;  Service: Cardiovascular;  Laterality: N/A;   RIGHT/LEFT HEART CATH AND CORONARY ANGIOGRAPHY N/A 05/31/2021   Procedure: RIGHT/LEFT HEART CATH AND CORONARY ANGIOGRAPHY;  Surgeon: Burnell Blanks, MD;  Location: [place] CV LAB;  Service: Cardiovascular;  Laterality: N/A;   TEE WITHOUT CARDIOVERSION N/A 06/07/2021   Procedure: TRANSESOPHAGEAL ECHOCARDIOGRAM (TEE);  Surgeon: Burnell Blanks, MD;  Location: [place];  Service: Open Heart Surgery;  Laterality: N/A;   TONSILLECTOMY  as child   TOTAL KNEE ARTHROPLASTY  04/01/2012   Procedure: TOTAL KNEE ARTHROPLASTY;  Surgeon: Gearlean Alf, MD;  [facility];  Service: Orthopedics;  Laterality: Left;   TRANSCATHETER AORTIC VALVE REPLACEMENT, TRANSFEMORAL Bilateral 06/07/2021   Procedure: TRANSCATHETER AORTIC VALVE REPLACEMENT, RIGHT TRANSFEMORAL;  Surgeon: Burnell Blanks, MD;  Location: [place];  Service: Open Heart Surgery;  Laterality: Bilateral;   TRANSURETHRAL RESECTION OF PROSTATE     ULTRASOUND GUIDANCE FOR VASCULAR ACCESS Bilateral 06/07/2021   Procedure: ULTRASOUND GUIDANCE FOR VASCULAR ACCESS;  Surgeon: Burnell Blanks, MD;  Location: [place];  Service: Open Heart Surgery;   Laterality: Bilateral;   UMBILICAL HERNIA REPAIR  2011   Social History:   reports that he quit smoking about 6 months ago. His smoking use included cigarettes. He has a 30.00 pack-year smoking history. He has never used smokeless tobacco. He reports current alcohol use. He reports that he does not use drugs.  Family History  Problem Relation Age of Onset   Heart disease Mother        Onset ~61 y/o   Hypertension Mother        Deceased from old age at 6   Hyperlipidemia Mother    Arthritis Mother    Diabetes Father        Deceased from old age at 44   Heart attack Father    Heart disease Father        CABG at age 49   Dementia Father 22   Arthritis Father    Prostate cancer Maternal Grandfather    Colon cancer Neg Hx    Esophageal cancer Neg Hx    Rectal cancer Neg Hx    Stomach cancer Neg Hx     Medications: Patient's Medications  New Prescriptions   No medications on file  Previous Medications   ACETAMINOPHEN (TYLENOL) 650 MG CR TABLET    Take 1,300 mg by mouth at bedtime.   ALBUTEROL (VENTOLIN HFA) 108 (90 BASE) MCG/ACT INHALER    Inhale 2 puffs into the lungs every 6 (six) hours as needed for wheezing or shortness of breath.   AMOXICILLIN (AMOXIL) 500 MG TABLET    Take 4 tablets (2,000 mg total) by mouth as directed. 1 HOUR PRIOR TO DENTAL PROCEDURES   ASPIRIN 81 MG EC TABLET    Take 1 tablet (81 mg total) by mouth daily. Swallow whole.   ATORVASTATIN (LIPITOR) 40 MG TABLET    Take 1 tablet (40 mg total) by mouth daily.   CIPROFLOXACIN (CIPRO) 500 MG TABLET    Take 1 tablet (500 mg total) by mouth 2 (two) times daily.   FINASTERIDE (PROSCAR) 5 MG TABLET    Take 1 tablet (5 mg total) by mouth daily.   FLUTICASONE FUROATE-VILANTEROL (BREO ELLIPTA) 200-25 MCG/ACT AEPB    Inhale 1 puff into the lungs daily.   FUROSEMIDE (  LASIX) 40 MG TABLET    Take 1 tablet (40 mg total) by mouth as needed.   GABAPENTIN (NEURONTIN) 300 MG CAPSULE    Take 1 capsule (300 mg total) by mouth 2  (two) times daily.   GUAIFENESIN 200 MG TABLET    Take 2 tablets (400 mg total) by mouth 2 (two) times daily.   KETOCONAZOLE (NIZORAL) 2 % CREAM    Apply 1 fingertip amount to each foot daily.   METHOCARBAMOL (ROBAXIN) 500 MG TABLET    Take 1 tablet (500 mg total) by mouth at bedtime as needed for muscle spasms.   METOPROLOL TARTRATE (LOPRESSOR) 25 MG TABLET    Take 1/2 tablet (12.5 mg total) by mouth 2 (two) times daily.   MIRABEGRON ER (MYRBETRIQ) 25 MG TB24 TABLET    Take 1 tablet (25 mg total) by mouth every morning.   NICOTINE (NICODERM CQ - DOSED IN MG/24 HOURS) 21 MG/24HR PATCH    Place 1 patch (21 mg total) onto the skin daily as needed.   OLMESARTAN (BENICAR) 40 MG TABLET    Take 1 tablet (40 mg total) by mouth daily. Please hold this medication, please discuss with your cardiology when should you restart this medication,   PRESCRIPTION MEDICATION    Inhale into the lungs at bedtime. BIPAP   SERTRALINE (ZOLOFT) 50 MG TABLET    Take 1 tablet (50 mg total) by mouth daily.   TAMSULOSIN (FLOMAX) 0.4 MG CAPS CAPSULE    TAKE 1 CAPSULE BY MOUTH ONCE DAILY AFTER SUPPER  Modified Medications   No medications on file  Discontinued Medications   No medications on file    Physical Exam:  There were no vitals filed for this visit. There is no height or weight on file to calculate BMI. Wt Readings from Last 3 Encounters:  09/13/21 222 lb (100.7 kg)  07/13/21 212 lb (96.2 kg)  06/23/21 208 lb 9.6 oz (94.6 kg)    Physical Exam Vitals and nursing note reviewed.  Constitutional:      Appearance: Normal appearance.  Musculoskeletal:     Comments: There is a large soft tissue swelling over the left elbow.  Skin overlying the swollen area was prepped with alcohol and anesthetized with ethyl chloride.  Using a 10 cc syringe and 18-gauge needle about 15 cc of fluid were withdrawn.  The this is not purulent but is blood-tinged.  Swelling resolved with aspiration and a small amount of Depo-Medrol was  instilled and pressure dressing applied.  Patient was instructed to apply ice pack when available at home.  Neurological:     Mental Status: He is alert.    Labs reviewed: Basic Metabolic Panel: Recent Labs    05/29/21 0015 05/30/21 0705 05/31/21 0419 06/08/21 0504 06/09/21 0331 06/14/21 1523 07/13/21 1628 10/17/21 1413  NA 143 141   < > 137   < > 142 145* 142  K 4.6 4.3   < > 4.3   < > 4.5 4.8 4.5  CL 96* 91*   < > 97*   < > 103 105 104  CO2 42* 44*   < > 32   < > '31 23 30  '$ GLUCOSE 127* 90   < > 116*   < > 100* 109* 110*  BUN 22 22   < > 22   < > 25 23 26*  CREATININE 1.16 0.73   < > 0.92   < > 0.79 0.79 0.73  CALCIUM 8.2* 8.3*   < >  8.4*   < > 9.0 8.9 9.0  MG 2.3 2.2  --  2.1  --   --   --   --    < > = values in this interval not displayed.   Liver Function Tests: Recent Labs    05/27/21 1420 05/28/21 0026  AST 19 22  ALT 31 29  ALKPHOS 56 48  BILITOT 0.8 1.1  PROT 6.2* 5.5*  ALBUMIN 3.7 3.3*   No results for input(s): LIPASE, AMYLASE in the last 8760 hours. No results for input(s): AMMONIA in the last 8760 hours. CBC: Recent Labs    05/20/21 1710 05/27/21 1420 05/28/21 0026 05/31/21 1320 06/10/21 0428 06/14/21 1523 10/17/21 1413  WBC 6.8 7.7 5.8   < > 6.2 7.3 7.0  NEUTROABS 5,413 6.1 5.3  --   --   --   --   HGB 16.3 16.6 15.7   < > 13.1 15.3 15.0  HCT 49.3 52.8* 48.4   < > 40.6 44.4 44.9  MCV 95.0 101.5* 98.0   < > 96.0 91.9 94.3  PLT 213 186 167   < > 151 208 154   < > = values in this interval not displayed.   Lipid Panel: Recent Labs    05/31/21 0419  CHOL 159  HDL 58  LDLCALC 88  TRIG 64  CHOLHDL 2.7   TSH: No results for input(s): TSH in the last 8760 hours. A1C: Lab Results  Component Value Date   HGBA1C 5.5 06/06/2021     Assessment/Plan    1 Olecranon bursitis of left elbow Bursa aspirated as described above.  Patient tolerated without any complications.  Have asked him to apply ice at home to keep swelling from  recurrence   [name], MD [place] [phone]

## 2021-11-09 ENCOUNTER — Encounter: Payer: Self-pay | Admitting: Cardiology

## 2021-11-09 ENCOUNTER — Ambulatory Visit (INDEPENDENT_AMBULATORY_CARE_PROVIDER_SITE_OTHER): Payer: Medicare Other | Admitting: Cardiology

## 2021-11-09 VITALS — BP 192/100 | HR 88 | Ht 66.6 in | Wt 215.6 lb

## 2021-11-09 DIAGNOSIS — Z9889 Other specified postprocedural states: Secondary | ICD-10-CM

## 2021-11-09 DIAGNOSIS — I2510 Atherosclerotic heart disease of native coronary artery without angina pectoris: Secondary | ICD-10-CM

## 2021-11-09 DIAGNOSIS — Z8679 Personal history of other diseases of the circulatory system: Secondary | ICD-10-CM

## 2021-11-09 DIAGNOSIS — E785 Hyperlipidemia, unspecified: Secondary | ICD-10-CM | POA: Diagnosis not present

## 2021-11-09 DIAGNOSIS — F17210 Nicotine dependence, cigarettes, uncomplicated: Secondary | ICD-10-CM

## 2021-11-09 DIAGNOSIS — Z952 Presence of prosthetic heart valve: Secondary | ICD-10-CM | POA: Diagnosis not present

## 2021-11-09 HISTORY — DX: Other specified postprocedural states: Z98.890

## 2021-11-09 NOTE — Progress Notes (Signed)
Cardiology Office Note:    Date:  11/09/2021   ID:  Kyle Lewis, DOB 02/01/1946, MRN [id number]  PCP:  Kyle Honour, MD  Cardiologist:  Kyle Lindau, MD   Referring MD: Kyle Honour, MD    ASSESSMENT:    1. Coronary artery disease involving native coronary artery of native heart without angina pectoris   2. Hyperlipidemia LDL goal <70   3. S/P TAVR (transcatheter aortic valve replacement)   4. H/O abdominal aortic aneurysm repair   5. Status post AAA (abdominal aortic aneurysm) repair   6. Cigarette smoker    PLAN:    In order of problems listed above:  Coronary artery disease: Secondary prevention stressed with the patient.  Importance of compliance with diet and medication stressed and she vocalized understanding. Post TAVR: Stable at this time and medical management.  We will continue to monitor. History of abdominal aneurysm of the aorta postrepair: Followed by his vascular surgeon/according to the patient. Mixed dyslipidemia: Lipids were reviewed from [facility] sheet and they are fine the closely monitored by his primary care. Cigarette smoker: I spent 5 minutes with the patient discussing solely about smoking. Smoking cessation was counseled. I suggested to the patient also different medications and pharmacological interventions. Patient is keen to try stopping on its own at this time. He will get back to me if he needs any further assistance in this matter. Obesity: Weight reduction was stressed and diet was emphasized and he promises to do better. Essential tension: Blood pressure is elevated but he tells me that blood pressure yesterday at doctor's office was 130/80 and he promises to do regular monitoring and get back to me with a blood pressure log. Patient will be seen in follow-up appointment in 6 months or earlier if the patient has any concerns    Medication Adjustments/Labs and Tests Ordered: Current medicines are reviewed at length with the patient  today.  Concerns regarding medicines are outlined above.  No orders of the defined types were placed in this encounter.  No orders of the defined types were placed in this encounter.    No chief complaint on file.    History of Present Illness:    Kyle Lewis is a 76 y.o. male.  Patient has past medical history of coronary artery disease, abdominal aortic aneurysm repair, TAVR surgery, essential hypertension dyslipidemia and continues to unfortunately smoke.  He leads a sedentary lifestyle.  He is obese.  He denies any chest pain orthopnea or PND.  At the time of my evaluation, the patient is alert awake oriented and in no distress.  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   Abnormal gait 07/31/2019   Abscess of left leg    Acute on chronic diastolic heart failure (HCC)    Acute respiratory failure with hypoxia and hypercapnia ([place]) 05/27/2021   HC03   06/14/21    =  31  - 06/23/2021   Walked 250 ft  at a slow pace with a cane. Complained of back and left knee hurting with lowest sat 89% so d/c'd 02    Adenomatous colon polyp    Adrenal mass ([place])    2 benign appearing left adrenal adenomas noted on 01/13/15 CT   Allergy    seasonal   Anxiety 04/08/2020   Arthritis    Blister of multiple sites of lower extremity 01/10/2018  Body mass index (BMI) 34.0-34.9, adult 04/07/2020   BPH with obstruction/lower urinary tract symptoms    Overview:  Probable based on symptoms   CAD (coronary artery disease) 02/01/2018   Carotid arterial disease ([place]) 05/23/2012   Carotid artery occlusion    Chronic back pain    Cigarette smoker 09/05/2018   COLONIC POLYPS, ADENOMATOUS, HX OF 03/17/2010   Qualifier: Diagnosis of  By: Harlon Ditty CMA (AAMA), Dottie     COPD GOLD II 04/07/2019   Quit smoking 03/2019 - PFT's  04/07/2019  FEV1 1.58 (57 % ) ratio 0.54  p 0 % improvement from saba p nothing prior to study with DLCO  15.22  (66%) corrects to 3.05 (74%)  for alv volume and FV curve concave classically     Degeneration of cervical intervertebral disc    Degeneration of lumbar intervertebral disc 06/14/2015   Depression 03/17/2010   Qualifier: Diagnosis of  By: Nelson-Smith CMA (AAMA), Dottie     Diverticulosis    DIVERTICULOSIS, COLON 03/17/2010   Qualifier: Diagnosis of  By: Harlon Ditty CMA (AAMA), Dottie     Dyspnea    Exogenous obesity 11/08/2013   Overview:  With a nine pound weight gain since his last visit   Heart murmur    History of back surgery    Rods and Screws in back   History of craniotomy 01/11/2018   History of left knee replacement    History of right MCA stroke 07/21/2014   Hyperlipidemia LDL goal <70 01/19/2018   Hypertension    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 Oneida Alar and 06/10/2015 last visit   Impairment of balance 10/27/2021   Inflammation of sacroiliac joint ([place]) 05/06/2019   Lower urinary tract symptoms (LUTS) 08/06/2017   Meningioma ([place])    Microscopic hematuria 11/08/2013   Mitral regurgitation 06/02/2021   Muscle weakness 10/27/2021   Neck pain 03/25/2019   Nocturia    OA (osteoarthritis) of knee 04/01/2012   OAB (overactive bladder) 01/19/2014   OSA (obstructive sleep apnea)    Bipap per Chodri since ? 2018 - Download 09/05/2018 used > 4 h x > 92% of days and avg use 8 h 73mn with AHI 3.1 @ 6 ipap and 10 epap    Osteoarthritis of right glenohumeral joint 08/14/2017   Other specified postprocedural states 09/25/2017   Peripheral vascular disease ([place]    Pre-procedure lab exam 01/19/2018   Preop cardiovascular exam 10/24/2018   Ringing in ear    (SLIGHT)   S/P lumbar spinal fusion 07/07/2015   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   s/p TAVR with a 29 mm Edwards S3U via the TF approach by Dr. MAngelena Form& Dr. BCyndia Bent  Severe aortic stenosis 02/01/2018   ECHO  01/30/18 - Left ventricle: The cavity size was normal. There was moderate   focal basal hypertrophy of the septum. Systolic function was   vigorous. The estimated ejection fraction was in the range of 65%   to 70%. Wall motion was normal; there were no regional wall   motion abnormalities. Doppler parameters are consistent with   abnormal left ventricular relaxation (grade 1 diastolic   dysfun   Status post AAA (abdominal aortic aneurysm) repair 02/01/2018   Stroke ([place] 2013   tia no residual deficit from   Tobacco use 11/08/2013   Ventral hernia 11/08/2013   Overview:  S/p repair with alloderm mesh  and s/p SB resection    Past Surgical History:  Procedure Laterality Date   ABDOMINAL AORTIC ANEURYSM REPAIR  2019   [place]  as child   BACK SURGERY  2018   Rods and Screws in Back lower back   BRAIN MENINGIOMA EXCISION  1991   menigioma   CARPAL TUNNEL RELEASE     left   EYE SURGERY Bilateral    Cataract   IRRIGATION AND DEBRIDEMENT ABSCESS Left 08/25/2020   Procedure: IRRIGATION AND DEBRIDEMENT ABSCESS LEFT LEG;  Surgeon: Evelina Bucy, DPM;  Location: [place];  Service: Podiatry;  Laterality: Left;   JOINT REPLACEMENT Left 04-01-12   Knee   LEFT HEART CATH AND CORONARY ANGIOGRAPHY N/A 09/15/2016   Procedure: Left Heart Cath and Coronary Angiography;  Surgeon: Nelva Bush, MD;  Location: [place] [facility];  Service: Cardiovascular;  Laterality: N/A;   MAXIMUM ACCESS (MAS)POSTERIOR LUMBAR INTERBODY FUSION (PLIF) 1 LEVEL N/A 07/07/2015   Procedure:  POSTERIOR LUMBAR INTERBODY FUSION (PLIF) Lumbar Four-Five with Pedicle Screw Fixation Lumbar Two-Five;Laminectomy Lumbar Two-Five;  Surgeon: Eustace Moore, MD;  Location: [place];  Service: Neurosurgery;  Laterality: N/A;   POSTERIOR LUMBAR INTERBODY FUSION (PLIF) Lumbar Four-Five with Pedicle Screw Fixation Lumbar Two-Five;Laminectomy Lumbar Two-Five   RIGHT/LEFT HEART CATH AND CORONARY ANGIOGRAPHY N/A  01/24/2018   Procedure: RIGHT/LEFT HEART CATH AND CORONARY ANGIOGRAPHY;  Surgeon: Nelva Bush, MD;  Location: [place] [facility];  Service: Cardiovascular;  Laterality: N/A;   RIGHT/LEFT HEART CATH AND CORONARY ANGIOGRAPHY N/A 05/31/2021   Procedure: RIGHT/LEFT HEART CATH AND CORONARY ANGIOGRAPHY;  Surgeon: Burnell Blanks, MD;  Location: [place] [facility];  Service: Cardiovascular;  Laterality: N/A;   TEE WITHOUT CARDIOVERSION N/A 06/07/2021   Procedure: TRANSESOPHAGEAL ECHOCARDIOGRAM (TEE);  Surgeon: Burnell Blanks, MD;  Location: [place];  Service: Open Heart Surgery;  Laterality: N/A;   TONSILLECTOMY  as child   TOTAL KNEE ARTHROPLASTY  04/01/2012   Procedure: TOTAL KNEE ARTHROPLASTY;  Surgeon: Gearlean Alf, MD;  [facility]: [facility];  Service: Orthopedics;  Laterality: Left;   TRANSCATHETER AORTIC VALVE REPLACEMENT, TRANSFEMORAL Bilateral 06/07/2021   Procedure: TRANSCATHETER AORTIC VALVE REPLACEMENT, RIGHT TRANSFEMORAL;  Surgeon: Burnell Blanks, MD;  Location: [place];  Service: Open Heart Surgery;  Laterality: Bilateral;   TRANSURETHRAL RESECTION OF PROSTATE     ULTRASOUND GUIDANCE FOR VASCULAR ACCESS Bilateral 06/07/2021   Procedure: ULTRASOUND GUIDANCE FOR VASCULAR ACCESS;  Surgeon: Burnell Blanks, MD;  Location: [place];  Service: Open Heart Surgery;  Laterality: Bilateral;   UMBILICAL HERNIA REPAIR  2011    Current Medications: Current Meds  Medication Sig   acetaminophen (TYLENOL) 650 MG CR tablet Take 1,300 mg by mouth at bedtime.   albuterol (VENTOLIN HFA) 108 (90 Base) MCG/ACT inhaler Inhale 2 puffs into the lungs every 6 (six) hours as needed for wheezing or shortness of breath.   amoxicillin (AMOXIL) 500 MG tablet Take 4 tablets (2,000 mg total) by mouth as directed. 1 HOUR PRIOR TO DENTAL PROCEDURES   aspirin 81 MG EC tablet Take 1 tablet (81 mg total) by mouth daily. Swallow whole.   atorvastatin (LIPITOR) 40 MG tablet Take 1 tablet (40 mg total)  by mouth daily.   ciprofloxacin (CIPRO) 500 MG tablet Take 1 tablet (500 mg total) by mouth 2 (two) times daily.   finasteride (PROSCAR) 5 MG tablet Take 1 tablet (5 mg total) by mouth daily.   fluticasone furoate-vilanterol (BREO ELLIPTA)  200-25 MCG/ACT AEPB Inhale 1 puff into the lungs daily.   furosemide (LASIX) 40 MG tablet Take 1 tablet (40 mg total) by mouth as needed.   gabapentin (NEURONTIN) 300 MG capsule Take 1 capsule (300 mg total) by mouth 2 (two) times daily.   guaiFENesin 200 MG tablet Take 2 tablets (400 mg total) by mouth 2 (two) times daily.   ketoconazole (NIZORAL) 2 % cream Apply 1 fingertip amount to each foot daily.   methocarbamol (ROBAXIN) 500 MG tablet Take 1 tablet (500 mg total) by mouth at bedtime as needed for muscle spasms.   metoprolol tartrate (LOPRESSOR) 25 MG tablet Take 1/2 tablet (12.5 mg total) by mouth 2 (two) times daily.   mirabegron ER (MYRBETRIQ) 25 MG TB24 tablet Take 1 tablet (25 mg total) by mouth every morning.   nicotine (NICODERM CQ - DOSED IN MG/24 HOURS) 21 mg/24hr patch Place 1 patch (21 mg total) onto the skin daily as needed.   olmesartan (BENICAR) 40 MG tablet Take 1 tablet (40 mg total) by mouth daily. Please hold this medication, please discuss with your cardiology when should you restart this medication,   PRESCRIPTION MEDICATION Inhale into the lungs at bedtime. BIPAP   sertraline (ZOLOFT) 50 MG tablet Take 1 tablet (50 mg total) by mouth daily.   tamsulosin (FLOMAX) 0.4 MG CAPS capsule TAKE 1 CAPSULE BY MOUTH ONCE DAILY AFTER SUPPER     Allergies:   Cefazolin   Social History   Socioeconomic History   Marital status: Married    Spouse name: [name]    Number of children: 2   Years of education: College    Highest education level: Not on file  Occupational History    Comment: Self employed Administrator retired  Tobacco Use   Smoking status: Former    Packs/day: 0.75    Years: 40.00    Pack years: 30.00    Types: Cigarettes     Quit date: 05/05/2021    Years since quitting: 0.5   Smokeless tobacco: Never   Tobacco comments:    1/2 pack a day if that   Vaping Use   Vaping Use: Never used  Substance and Sexual Activity   Alcohol use: Yes    Comment: rare   Drug use: No   Sexual activity: Not Currently  Other Topics Concern   Not on file  Social History Narrative   Patient lives at home with his wife. Rhonda    Patient has a Financial risk analyst.    Patient has 2 sons.    Patient smokes a half pack a day.          Social Determinants of Health   Financial Resource Strain: Not on file  Food Insecurity: Not on file  Transportation Needs: Not on file  Physical Activity: Not on file  Stress: Not on file  Social Connections: Not on file     Family History: The patient's family history includes Arthritis in his father and mother; Dementia (age of onset: 15) in his father; Diabetes in his father; Heart attack in his father; Heart disease in his father and mother; Hyperlipidemia in his mother; Hypertension in his mother; Prostate cancer in his maternal grandfather. There is no history of Colon cancer, Esophageal cancer, Rectal cancer, or Stomach cancer.  ROS:   Please see the history of present illness.    All other systems reviewed and are negative.  EKGs/Labs/Other Studies Reviewed:    The following studies were reviewed today: I  discussed my findings with the patient at length.   Recent Labs: 05/27/2021: B Natriuretic Peptide 1,348.7 05/28/2021: ALT 29 06/08/2021: Magnesium 2.1 10/17/2021: BUN 26; Creatinine, Ser 0.73; Hemoglobin 15.0; Platelets 154; Potassium 4.5; Sodium 142  Recent Lipid Panel    Component Value Date/Time   CHOL 159 05/31/2021 0419   CHOL 144 02/01/2018 1101   TRIG 64 05/31/2021 0419   HDL 58 05/31/2021 0419   HDL 48 02/01/2018 1101   CHOLHDL 2.7 05/31/2021 0419   VLDL 13 05/31/2021 0419   LDLCALC 88 05/31/2021 0419   LDLCALC 95 04/08/2020 1506    Physical Exam:    VS:   BP (!) 192/100   Pulse 88   Ht 5' 6.6" (1.692 m)   Wt 215 lb 9.6 oz (97.8 kg)   SpO2 94%   BMI 34.17 kg/m     Wt Readings from Last 3 Encounters:  11/09/21 215 lb 9.6 oz (97.8 kg)  11/08/21 225 lb (102.1 kg)  09/13/21 222 lb (100.7 kg)     GEN: Patient is in no acute distress HEENT: Normal NECK: No JVD; No carotid bruits LYMPHATICS: No lymphadenopathy CARDIAC: Hear sounds regular, 2/6 systolic murmur at the apex. RESPIRATORY:  Clear to auscultation without rales, wheezing or rhonchi  ABDOMEN: Soft, non-tender, non-distended MUSCULOSKELETAL:  No edema; No deformity  SKIN: Warm and dry NEUROLOGIC:  Alert and oriented x 3 PSYCHIATRIC:  Normal affect   Signed, Kyle Lindau, MD  11/09/2021 1:32 PM    [facility]

## 2021-11-09 NOTE — Patient Instructions (Signed)

## 2021-11-18 ENCOUNTER — Other Ambulatory Visit: Payer: Self-pay | Admitting: *Deleted

## 2021-11-18 MED ORDER — METOPROLOL TARTRATE 25 MG PO TABS: 12.5000 mg | ORAL_TABLET | Freq: Two times a day (BID) | ORAL | 1 refills | Status: DC

## 2021-11-18 NOTE — Telephone Encounter (Signed)
[facility] requested refill.

## 2021-11-25 ENCOUNTER — Emergency Department (HOSPITAL_COMMUNITY): Payer: Medicare Other

## 2021-11-25 ENCOUNTER — Other Ambulatory Visit: Payer: Self-pay

## 2021-11-25 ENCOUNTER — Emergency Department (HOSPITAL_COMMUNITY)
Admission: EM | Admit: 2021-11-25 | Discharge: 2021-11-25 | Discharge status: Against Medical Advice | Payer: Medicare Other | Attending: Emergency Medicine | Admitting: Emergency Medicine

## 2021-11-25 ENCOUNTER — Encounter (HOSPITAL_COMMUNITY): Payer: Self-pay

## 2021-11-25 DIAGNOSIS — M545 Low back pain, unspecified: Secondary | ICD-10-CM | POA: Insufficient documentation

## 2021-11-25 DIAGNOSIS — Z5321 Procedure and treatment not carried out due to patient leaving prior to being seen by health care provider: Secondary | ICD-10-CM | POA: Insufficient documentation

## 2021-11-25 MED ORDER — HYDROCODONE-ACETAMINOPHEN 5-325 MG PO TABS: 2.0000 | ORAL_TABLET | Freq: Once | ORAL | Status: DC

## 2021-11-30 ENCOUNTER — Other Ambulatory Visit: Payer: Self-pay

## 2021-11-30 ENCOUNTER — Encounter (HOSPITAL_COMMUNITY): Payer: Self-pay | Admitting: Pharmacy Technician

## 2021-11-30 ENCOUNTER — Inpatient Hospital Stay (HOSPITAL_COMMUNITY)
Admission: EM | Admit: 2021-11-30 | Discharge: 2021-12-16 | DRG: 314 | Disposition: A | Discharge status: Skilled Nursing Facility | Payer: Medicare Other | Attending: Internal Medicine | Admitting: Internal Medicine

## 2021-11-30 ENCOUNTER — Emergency Department (HOSPITAL_COMMUNITY): Payer: Medicare Other

## 2021-11-30 DIAGNOSIS — T826XXD Infection and inflammatory reaction due to cardiac valve prosthesis, subsequent encounter: Secondary | ICD-10-CM | POA: Diagnosis not present

## 2021-11-30 DIAGNOSIS — K030 Excessive attrition of teeth: Secondary | ICD-10-CM

## 2021-11-30 DIAGNOSIS — M4646 Discitis, unspecified, lumbar region: Secondary | ICD-10-CM | POA: Diagnosis not present

## 2021-11-30 DIAGNOSIS — Z79899 Other long term (current) drug therapy: Secondary | ICD-10-CM

## 2021-11-30 DIAGNOSIS — E785 Hyperlipidemia, unspecified: Secondary | ICD-10-CM | POA: Diagnosis not present

## 2021-11-30 DIAGNOSIS — Z881 Allergy status to other antibiotic agents status: Secondary | ICD-10-CM

## 2021-11-30 DIAGNOSIS — Z8249 Family history of ischemic heart disease and other diseases of the circulatory system: Secondary | ICD-10-CM

## 2021-11-30 DIAGNOSIS — E669 Obesity, unspecified: Secondary | ICD-10-CM | POA: Diagnosis present

## 2021-11-30 DIAGNOSIS — Z0120 Encounter for dental examination and cleaning without abnormal findings: Secondary | ICD-10-CM | POA: Diagnosis not present

## 2021-11-30 DIAGNOSIS — M009 Pyogenic arthritis, unspecified: Secondary | ICD-10-CM | POA: Diagnosis not present

## 2021-11-30 DIAGNOSIS — I10 Essential (primary) hypertension: Secondary | ICD-10-CM | POA: Diagnosis not present

## 2021-11-30 DIAGNOSIS — G8929 Other chronic pain: Secondary | ICD-10-CM | POA: Diagnosis present

## 2021-11-30 DIAGNOSIS — I452 Bifascicular block: Secondary | ICD-10-CM | POA: Diagnosis present

## 2021-11-30 DIAGNOSIS — Z96652 Presence of left artificial knee joint: Secondary | ICD-10-CM | POA: Diagnosis present

## 2021-11-30 DIAGNOSIS — J9601 Acute respiratory failure with hypoxia: Secondary | ICD-10-CM | POA: Diagnosis not present

## 2021-11-30 DIAGNOSIS — A4189 Other specified sepsis: Secondary | ICD-10-CM | POA: Diagnosis present

## 2021-11-30 DIAGNOSIS — R21 Rash and other nonspecific skin eruption: Secondary | ICD-10-CM | POA: Diagnosis not present

## 2021-11-30 DIAGNOSIS — I110 Hypertensive heart disease with heart failure: Secondary | ICD-10-CM | POA: Diagnosis present

## 2021-11-30 DIAGNOSIS — M461 Sacroiliitis, not elsewhere classified: Secondary | ICD-10-CM

## 2021-11-30 DIAGNOSIS — J449 Chronic obstructive pulmonary disease, unspecified: Secondary | ICD-10-CM | POA: Diagnosis not present

## 2021-11-30 DIAGNOSIS — I330 Acute and subacute infective endocarditis: Secondary | ICD-10-CM

## 2021-11-30 DIAGNOSIS — Z8261 Family history of arthritis: Secondary | ICD-10-CM

## 2021-11-30 DIAGNOSIS — K032 Erosion of teeth: Secondary | ICD-10-CM | POA: Diagnosis not present

## 2021-11-30 DIAGNOSIS — N400 Enlarged prostate without lower urinary tract symptoms: Secondary | ICD-10-CM | POA: Diagnosis present

## 2021-11-30 DIAGNOSIS — M549 Dorsalgia, unspecified: Secondary | ICD-10-CM | POA: Diagnosis present

## 2021-11-30 DIAGNOSIS — J9621 Acute and chronic respiratory failure with hypoxia: Secondary | ICD-10-CM | POA: Diagnosis present

## 2021-11-30 DIAGNOSIS — F17210 Nicotine dependence, cigarettes, uncomplicated: Secondary | ICD-10-CM | POA: Diagnosis present

## 2021-11-30 DIAGNOSIS — Z833 Family history of diabetes mellitus: Secondary | ICD-10-CM

## 2021-11-30 DIAGNOSIS — Z8679 Personal history of other diseases of the circulatory system: Secondary | ICD-10-CM

## 2021-11-30 DIAGNOSIS — Z8042 Family history of malignant neoplasm of prostate: Secondary | ICD-10-CM

## 2021-11-30 DIAGNOSIS — K036 Deposits [accretions] on teeth: Secondary | ICD-10-CM | POA: Diagnosis not present

## 2021-11-30 DIAGNOSIS — M19012 Primary osteoarthritis, left shoulder: Secondary | ICD-10-CM | POA: Diagnosis present

## 2021-11-30 DIAGNOSIS — Z9981 Dependence on supplemental oxygen: Secondary | ICD-10-CM

## 2021-11-30 DIAGNOSIS — Z20822 Contact with and (suspected) exposure to covid-19: Secondary | ICD-10-CM | POA: Diagnosis present

## 2021-11-30 DIAGNOSIS — R0902 Hypoxemia: Secondary | ICD-10-CM

## 2021-11-30 DIAGNOSIS — Y831 Surgical operation with implant of artificial internal device as the cause of abnormal reaction of the patient, or of later complication, without mention of misadventure at the time of the procedure: Secondary | ICD-10-CM | POA: Diagnosis present

## 2021-11-30 DIAGNOSIS — Z87891 Personal history of nicotine dependence: Secondary | ICD-10-CM | POA: Diagnosis present

## 2021-11-30 DIAGNOSIS — M19011 Primary osteoarthritis, right shoulder: Secondary | ICD-10-CM | POA: Diagnosis present

## 2021-11-30 DIAGNOSIS — K08109 Complete loss of teeth, unspecified cause, unspecified class: Secondary | ICD-10-CM

## 2021-11-30 DIAGNOSIS — J9622 Acute and chronic respiratory failure with hypercapnia: Secondary | ICD-10-CM | POA: Diagnosis present

## 2021-11-30 DIAGNOSIS — Z981 Arthrodesis status: Secondary | ICD-10-CM

## 2021-11-30 DIAGNOSIS — G4733 Obstructive sleep apnea (adult) (pediatric): Secondary | ICD-10-CM | POA: Diagnosis present

## 2021-11-30 DIAGNOSIS — Z7951 Long term (current) use of inhaled steroids: Secondary | ICD-10-CM

## 2021-11-30 DIAGNOSIS — R296 Repeated falls: Secondary | ICD-10-CM | POA: Diagnosis present

## 2021-11-30 DIAGNOSIS — J440 Chronic obstructive pulmonary disease with acute lower respiratory infection: Secondary | ICD-10-CM | POA: Diagnosis present

## 2021-11-30 DIAGNOSIS — Z720 Tobacco use: Secondary | ICD-10-CM | POA: Diagnosis present

## 2021-11-30 DIAGNOSIS — M00812 Arthritis due to other bacteria, left shoulder: Secondary | ICD-10-CM | POA: Diagnosis not present

## 2021-11-30 DIAGNOSIS — M25512 Pain in left shoulder: Secondary | ICD-10-CM | POA: Diagnosis not present

## 2021-11-30 DIAGNOSIS — Z6834 Body mass index (BMI) 34.0-34.9, adult: Secondary | ICD-10-CM

## 2021-11-30 DIAGNOSIS — I5032 Chronic diastolic (congestive) heart failure: Secondary | ICD-10-CM | POA: Diagnosis present

## 2021-11-30 DIAGNOSIS — G061 Intraspinal abscess and granuloma: Secondary | ICD-10-CM | POA: Diagnosis present

## 2021-11-30 DIAGNOSIS — K0530 Chronic periodontitis, unspecified: Secondary | ICD-10-CM

## 2021-11-30 DIAGNOSIS — I2510 Atherosclerotic heart disease of native coronary artery without angina pectoris: Secondary | ICD-10-CM | POA: Diagnosis present

## 2021-11-30 DIAGNOSIS — I340 Nonrheumatic mitral (valve) insufficiency: Secondary | ICD-10-CM | POA: Diagnosis not present

## 2021-11-30 DIAGNOSIS — I38 Endocarditis, valve unspecified: Secondary | ICD-10-CM | POA: Diagnosis not present

## 2021-11-30 DIAGNOSIS — N281 Cyst of kidney, acquired: Secondary | ICD-10-CM | POA: Diagnosis present

## 2021-11-30 DIAGNOSIS — M545 Low back pain, unspecified: Secondary | ICD-10-CM | POA: Diagnosis not present

## 2021-11-30 DIAGNOSIS — R627 Adult failure to thrive: Secondary | ICD-10-CM | POA: Diagnosis present

## 2021-11-30 DIAGNOSIS — M21372 Foot drop, left foot: Secondary | ICD-10-CM | POA: Diagnosis present

## 2021-11-30 DIAGNOSIS — K029 Dental caries, unspecified: Secondary | ICD-10-CM

## 2021-11-30 DIAGNOSIS — G062 Extradural and subdural abscess, unspecified: Secondary | ICD-10-CM | POA: Diagnosis not present

## 2021-11-30 DIAGNOSIS — J159 Unspecified bacterial pneumonia: Secondary | ICD-10-CM | POA: Diagnosis present

## 2021-11-30 DIAGNOSIS — Z83438 Family history of other disorder of lipoprotein metabolism and other lipidemia: Secondary | ICD-10-CM

## 2021-11-30 DIAGNOSIS — L02416 Cutaneous abscess of left lower limb: Secondary | ICD-10-CM | POA: Diagnosis not present

## 2021-11-30 DIAGNOSIS — R7881 Bacteremia: Secondary | ICD-10-CM | POA: Diagnosis not present

## 2021-11-30 DIAGNOSIS — K031 Abrasion of teeth: Secondary | ICD-10-CM

## 2021-11-30 DIAGNOSIS — Z7982 Long term (current) use of aspirin: Secondary | ICD-10-CM

## 2021-11-30 DIAGNOSIS — Z952 Presence of prosthetic heart valve: Secondary | ICD-10-CM

## 2021-11-30 DIAGNOSIS — J189 Pneumonia, unspecified organism: Secondary | ICD-10-CM

## 2021-11-30 DIAGNOSIS — J441 Chronic obstructive pulmonary disease with (acute) exacerbation: Secondary | ICD-10-CM | POA: Diagnosis present

## 2021-11-30 DIAGNOSIS — T826XXA Infection and inflammatory reaction due to cardiac valve prosthesis, initial encounter: Principal | ICD-10-CM | POA: Diagnosis present

## 2021-11-30 DIAGNOSIS — B9689 Other specified bacterial agents as the cause of diseases classified elsewhere: Secondary | ICD-10-CM | POA: Diagnosis not present

## 2021-11-30 HISTORY — DX: Pneumonia, unspecified organism: J18.9

## 2021-11-30 LAB — COMPREHENSIVE METABOLIC PANEL
ALT: 19 U/L (ref 0–44)
AST: 19 U/L (ref 15–41)
Albumin: 2.9 g/dL — ABNORMAL LOW (ref 3.5–5.0)
Alkaline Phosphatase: 72 U/L (ref 38–126)
Anion gap: 7 (ref 5–15)
BUN: 23 mg/dL (ref 8–23)
CO2: 33 mmol/L — ABNORMAL HIGH (ref 22–32)
Calcium: 8.8 mg/dL — ABNORMAL LOW (ref 8.9–10.3)
Chloride: 100 mmol/L (ref 98–111)
Creatinine, Ser: 0.81 mg/dL (ref 0.61–1.24)
GFR, Estimated: >60 mL/min (ref >60)
Glucose, Bld: 149 mg/dL — ABNORMAL HIGH (ref 70–99)
Potassium: 4.4 mmol/L (ref 3.5–5.1)
Sodium: 140 mmol/L (ref 135–145)
Total Bilirubin: 0.8 mg/dL (ref 0.3–1.2)
Total Protein: 6.4 g/dL — ABNORMAL LOW (ref 6.5–8.1)

## 2021-11-30 LAB — BLOOD GAS, ARTERIAL
Acid-Base Excess: 10.8 mmol/L — ABNORMAL HIGH (ref 0.0–2.0)
Bicarbonate: 37.6 mmol/L — ABNORMAL HIGH (ref 20.0–28.0)
Drawn by: 27407
O2 Saturation: 94.9 %
Patient temperature: 36.8
pCO2 arterial: 57 mmHg — ABNORMAL HIGH (ref 32–48)
pH, Arterial: 7.42 (ref 7.35–7.45)
pO2, Arterial: 63 mmHg — ABNORMAL LOW (ref 83–108)

## 2021-11-30 LAB — RESPIRATORY PANEL BY PCR

## 2021-11-30 LAB — CBC WITH DIFFERENTIAL/PLATELET
Abs Immature Granulocytes: 0.18 10*3/uL — ABNORMAL HIGH (ref 0.00–0.07)
Basophils Absolute: 0 10*3/uL (ref 0.0–0.1)
Basophils Relative: 0 %
Eosinophils Absolute: 0 10*3/uL (ref 0.0–0.5)
Eosinophils Relative: 0 %
HCT: 40 % (ref 39.0–52.0)
Hemoglobin: 13.4 g/dL (ref 13.0–17.0)
Immature Granulocytes: 1 %
Lymphocytes Relative: 6 %
Lymphs Abs: 0.8 10*3/uL (ref 0.7–4.0)
MCH: 32.1 pg (ref 26.0–34.0)
MCHC: 33.5 g/dL (ref 30.0–36.0)
MCV: 95.7 fL (ref 80.0–100.0)
Monocytes Absolute: 0.9 10*3/uL (ref 0.1–1.0)
Monocytes Relative: 7 %
Neutro Abs: 11.6 10*3/uL — ABNORMAL HIGH (ref 1.7–7.7)
Neutrophils Relative %: 86 %
Platelets: 190 10*3/uL (ref 150–400)
RBC: 4.18 MIL/uL — ABNORMAL LOW (ref 4.22–5.81)
RDW: 14.5 % (ref 11.5–15.5)
WBC: 13.6 10*3/uL — ABNORMAL HIGH (ref 4.0–10.5)
nRBC: 0 % (ref 0.0–0.2)

## 2021-11-30 LAB — TROPONIN I (HIGH SENSITIVITY)
Troponin I (High Sensitivity): 21 ng/L — ABNORMAL HIGH (ref <18)
Troponin I (High Sensitivity): 20 ng/L — ABNORMAL HIGH (ref <18)
Troponin I (High Sensitivity): 21 ng/L — ABNORMAL HIGH (ref <18)

## 2021-11-30 LAB — BRAIN NATRIURETIC PEPTIDE: B Natriuretic Peptide: 175.1 pg/mL — ABNORMAL HIGH (ref 0.0–100.0)

## 2021-11-30 LAB — LACTIC ACID, PLASMA: Lactic Acid, Venous: 1.2 mmol/L (ref 0.5–1.9)

## 2021-11-30 LAB — SARS CORONAVIRUS 2 BY RT PCR: SARS Coronavirus 2 by RT PCR: NEGATIVE

## 2021-11-30 MED ORDER — ACETAMINOPHEN 325 MG PO TABS
650.0000 mg | ORAL_TABLET | Freq: Every evening | ORAL | Status: DC | PRN
  Administered 2021-12-02: 650 mg via ORAL
  Filled 2021-11-30: qty 2

## 2021-11-30 MED ORDER — GUAIFENESIN 200 MG PO TABS
400.0000 mg | ORAL_TABLET | Freq: Two times a day (BID) | ORAL | Status: DC
  Administered 2021-11-30 – 2021-12-16 (×32): 400 mg via ORAL
  Filled 2021-11-30 (×33): qty 2

## 2021-11-30 MED ORDER — IOHEXOL 350 MG/ML SOLN
75.0000 mL | Freq: Once | INTRAVENOUS | Status: AC | PRN
  Administered 2021-11-30: 75 mL via INTRAVENOUS

## 2021-11-30 MED ORDER — HEPARIN SODIUM (PORCINE) 5000 UNIT/ML IJ SOLN
5000.0000 [IU] | Freq: Three times a day (TID) | INTRAMUSCULAR | Status: DC
  Administered 2021-11-30 – 2021-12-16 (×48): 5000 [IU] via SUBCUTANEOUS
  Filled 2021-11-30 (×47): qty 1

## 2021-11-30 MED ORDER — SERTRALINE HCL 50 MG PO TABS
50.0000 mg | ORAL_TABLET | Freq: Every day | ORAL | Status: DC
  Administered 2021-11-30 – 2021-12-16 (×17): 50 mg via ORAL
  Filled 2021-11-30 (×17): qty 1

## 2021-11-30 MED ORDER — TRAMADOL HCL 50 MG PO TABS
50.0000 mg | ORAL_TABLET | ORAL | Status: DC | PRN
  Administered 2021-12-01: 50 mg via ORAL
  Filled 2021-11-30: qty 1

## 2021-11-30 MED ORDER — GABAPENTIN 300 MG PO CAPS
300.0000 mg | ORAL_CAPSULE | Freq: Two times a day (BID) | ORAL | Status: DC
  Administered 2021-11-30 – 2021-12-16 (×32): 300 mg via ORAL
  Filled 2021-11-30 (×32): qty 1

## 2021-11-30 MED ORDER — MORPHINE SULFATE (PF) 4 MG/ML IV SOLN
4.0000 mg | Freq: Once | INTRAVENOUS | Status: AC
  Administered 2021-11-30: 4 mg via INTRAVENOUS
  Filled 2021-11-30: qty 1

## 2021-11-30 MED ORDER — AMPICILLIN-SULBACTAM SODIUM 3 (2-1) G IJ SOLR
3.0000 g | Freq: Once | INTRAMUSCULAR | Status: AC
  Administered 2021-11-30: 3 g via INTRAVENOUS
  Filled 2021-11-30: qty 8

## 2021-11-30 MED ORDER — KETOROLAC TROMETHAMINE 30 MG/ML IJ SOLN
30.0000 mg | Freq: Once | INTRAMUSCULAR | Status: AC
Start: 2021-11-30 — End: 2021-11-30
  Administered 2021-11-30: 30 mg via INTRAVENOUS
  Filled 2021-11-30: qty 1

## 2021-11-30 MED ORDER — ATORVASTATIN CALCIUM 40 MG PO TABS
40.0000 mg | ORAL_TABLET | Freq: Every day | ORAL | Status: DC
  Administered 2021-11-30 – 2021-12-16 (×17): 40 mg via ORAL
  Filled 2021-11-30 (×17): qty 1

## 2021-11-30 MED ORDER — METHYLPREDNISOLONE SODIUM SUCC 40 MG IJ SOLR
40.0000 mg | Freq: Two times a day (BID) | INTRAMUSCULAR | Status: AC
  Administered 2021-11-30 – 2021-12-01 (×2): 40 mg via INTRAVENOUS
  Filled 2021-11-30 (×2): qty 1

## 2021-11-30 MED ORDER — AMPICILLIN-SULBACTAM SODIUM 3 (2-1) G IJ SOLR
3.0000 g | Freq: Four times a day (QID) | INTRAMUSCULAR | Status: DC
Start: 2021-11-30 — End: 2021-12-05
  Administered 2021-11-30 – 2021-12-05 (×18): 3 g via INTRAVENOUS
  Filled 2021-11-30 (×21): qty 8

## 2021-11-30 MED ORDER — PREDNISONE 20 MG PO TABS: 40.0000 mg | ORAL_TABLET | Freq: Every day | ORAL | Status: DC

## 2021-11-30 MED ORDER — ALBUTEROL SULFATE HFA 108 (90 BASE) MCG/ACT IN AERS
2.0000 | INHALATION_SPRAY | RESPIRATORY_TRACT | Status: DC | PRN
  Administered 2021-11-30: 2 via RESPIRATORY_TRACT
  Filled 2021-11-30: qty 6.7

## 2021-11-30 MED ORDER — TAMSULOSIN HCL 0.4 MG PO CAPS
0.4000 mg | ORAL_CAPSULE | Freq: Every day | ORAL | Status: DC
  Administered 2021-11-30 – 2021-12-15 (×16): 0.4 mg via ORAL
  Filled 2021-11-30 (×16): qty 1

## 2021-11-30 MED ORDER — SODIUM CHLORIDE 0.9 % IV SOLN
500.0000 mg | INTRAVENOUS | Status: DC
  Administered 2021-11-30: 500 mg via INTRAVENOUS
  Filled 2021-11-30: qty 5

## 2021-11-30 MED ORDER — ALBUTEROL SULFATE (2.5 MG/3ML) 0.083% IN NEBU: 2.5000 mg | INHALATION_SOLUTION | RESPIRATORY_TRACT | Status: DC | PRN

## 2021-11-30 MED ORDER — METOPROLOL TARTRATE 12.5 MG HALF TABLET
12.5000 mg | ORAL_TABLET | Freq: Two times a day (BID) | ORAL | Status: DC
  Administered 2021-11-30: 12.5 mg via ORAL
  Filled 2021-11-30: qty 1

## 2021-11-30 MED ORDER — ONDANSETRON HCL 4 MG PO TABS: 4.0000 mg | ORAL_TABLET | Freq: Four times a day (QID) | ORAL | Status: DC | PRN

## 2021-11-30 MED ORDER — IPRATROPIUM-ALBUTEROL 0.5-2.5 (3) MG/3ML IN SOLN
3.0000 mL | Freq: Three times a day (TID) | RESPIRATORY_TRACT | Status: DC
  Administered 2021-12-01 – 2021-12-02 (×3): 3 mL via RESPIRATORY_TRACT
  Filled 2021-11-30 (×4): qty 3

## 2021-11-30 MED ORDER — FINASTERIDE 5 MG PO TABS
5.0000 mg | ORAL_TABLET | Freq: Every day | ORAL | Status: DC
  Administered 2021-11-30 – 2021-12-16 (×17): 5 mg via ORAL
  Filled 2021-11-30 (×17): qty 1

## 2021-11-30 MED ORDER — IPRATROPIUM-ALBUTEROL 0.5-2.5 (3) MG/3ML IN SOLN
3.0000 mL | Freq: Four times a day (QID) | RESPIRATORY_TRACT | Status: DC
  Administered 2021-11-30: 3 mL via RESPIRATORY_TRACT
  Filled 2021-11-30: qty 3

## 2021-11-30 MED ORDER — ONDANSETRON HCL 4 MG/2ML IJ SOLN: 4.0000 mg | Freq: Four times a day (QID) | INTRAMUSCULAR | Status: DC | PRN

## 2021-11-30 MED ORDER — FLUTICASONE FUROATE-VILANTEROL 200-25 MCG/ACT IN AEPB
1.0000 | INHALATION_SPRAY | Freq: Every day | RESPIRATORY_TRACT | Status: DC
Start: 2021-11-30 — End: 2021-12-02
  Administered 2021-11-30 – 2021-12-02 (×2): 1 via RESPIRATORY_TRACT
  Filled 2021-11-30: qty 28

## 2021-11-30 NOTE — ED Provider Notes (Signed)
[facility] EMERGENCY DEPARTMENT Provider Note   CSN: [id number] Arrival date & time: 11/30/21  0758     History  Chief Complaint  Patient presents with   Back Pain    Kyle Lewis is a 76 y.o. male.  The history is provided by the patient and medical records. No language interpreter was used.  Back Pain Location:  Lumbar spine and thoracic spine Quality:  Aching Radiates to:  Does not radiate Pain severity:  Severe Pain is:  Unable to specify Onset quality:  Gradual Duration:  1 week Timing:  Constant Progression:  Worsening Chronicity:  New Context: not recent injury   Relieved by:  Nothing Worsened by:  Nothing Associated symptoms: no abdominal pain, no chest pain, no dysuria, no fever, no headaches, no numbness, no paresthesias, no perianal numbness, no weakness and no weight loss        Home Medications Prior to Admission medications   Medication Sig Start Date End Date Taking? Authorizing Provider  acetaminophen (TYLENOL) 650 MG CR tablet Take 1,300 mg by mouth at bedtime.    [provider]  albuterol (VENTOLIN HFA) 108 (90 Base) MCG/ACT inhaler Inhale 2 puffs into the lungs every 6 (six) hours as needed for wheezing or shortness of breath. 06/10/21   Florencia Reasons, MD  amoxicillin (AMOXIL) 500 MG tablet Take 4 tablets (2,000 mg total) by mouth as directed. 1 HOUR PRIOR TO DENTAL PROCEDURES 06/15/21   Eileen Stanford, PA-C  aspirin 81 MG EC tablet Take 1 tablet (81 mg total) by mouth daily. Swallow whole. 06/11/21   Florencia Reasons, MD  atorvastatin (LIPITOR) 40 MG tablet Take 1 tablet (40 mg total) by mouth daily. 08/12/21   [name], MD  ciprofloxacin (CIPRO) 500 MG tablet Take 1 tablet (500 mg total) by mouth 2 (two) times daily. 06/21/21   [name], MD  finasteride (PROSCAR) 5 MG tablet Take 1 tablet (5 mg total) by mouth daily. 05/05/21   Fargo, Amy E, NP  fluticasone furoate-vilanterol (BREO ELLIPTA) 200-25 MCG/ACT AEPB  Inhale 1 puff into the lungs daily. 06/11/21   Florencia Reasons, MD  furosemide (LASIX) 40 MG tablet Take 1 tablet (40 mg total) by mouth as needed. 06/15/21   Eileen Stanford, PA-C  gabapentin (NEURONTIN) 300 MG capsule Take 1 capsule (300 mg total) by mouth 2 (two) times daily. 05/05/21   Fargo, Amy E, NP  guaiFENesin 200 MG tablet Take 2 tablets (400 mg total) by mouth 2 (two) times daily. 05/05/21   Fargo, Amy E, NP  ketoconazole (NIZORAL) 2 % cream Apply 1 fingertip amount to each foot daily. 06/27/21   Evelina Bucy, DPM  methocarbamol (ROBAXIN) 500 MG tablet Take 1 tablet (500 mg total) by mouth at bedtime as needed for muscle spasms. 05/05/21   Fargo, Amy E, NP  metoprolol tartrate (LOPRESSOR) 25 MG tablet Take 1/2 tablet (12.5 mg total) by mouth 2 (two) times daily. 11/18/21   [name], MD  mirabegron ER (MYRBETRIQ) 25 MG TB24 tablet Take 1 tablet (25 mg total) by mouth every morning. 09/27/21   [name], MD  nicotine (NICODERM CQ - DOSED IN MG/24 HOURS) 21 mg/24hr patch Place 1 patch (21 mg total) onto the skin daily as needed. 06/15/21   Eileen Stanford, PA-C  olmesartan (BENICAR) 40 MG tablet Take 1 tablet (40 mg total) by mouth daily. Please hold this medication, please discuss with your cardiology when should you restart  this medication, 06/10/21   Florencia Reasons, MD  PRESCRIPTION MEDICATION Inhale into the lungs at bedtime. BIPAP    [provider]  sertraline (ZOLOFT) 50 MG tablet Take 1 tablet (50 mg total) by mouth daily. 06/15/21   Eileen Stanford, PA-C  tamsulosin (FLOMAX) 0.4 MG CAPS capsule TAKE 1 CAPSULE BY MOUTH ONCE DAILY AFTER SUPPER 07/18/21   [name], MD      Allergies    Cefazolin    Review of Systems   Review of Systems  Constitutional:  Positive for chills and fatigue. Negative for fever and weight loss.  HENT:  Negative for congestion.   Eyes:  Negative for visual disturbance.  Respiratory:  Positive for cough and chest tightness.  Negative for shortness of breath and wheezing.   Cardiovascular:  Negative for chest pain and palpitations.  Gastrointestinal:  Negative for abdominal pain, constipation, diarrhea, nausea and vomiting.  Genitourinary:  Negative for dysuria and flank pain.  Musculoskeletal:  Positive for back pain. Negative for neck pain and neck stiffness.  Skin:  Negative for rash and wound.  Neurological:  Negative for dizziness, weakness, light-headedness, numbness, headaches and paresthesias.  Psychiatric/Behavioral:  Negative for agitation and confusion.     Physical Exam Updated Vital Signs BP (!) 172/77   Pulse 66   Temp 98.4 F (36.9 C) (Oral)   Resp 20   SpO2 93%  Physical Exam Vitals and nursing note reviewed.  Constitutional:      General: He is not in acute distress.    Appearance: He is well-developed. He is not ill-appearing, toxic-appearing or diaphoretic.  HENT:     Head: Normocephalic and atraumatic.     Nose: No congestion or rhinorrhea.     Mouth/Throat:     Mouth: Mucous membranes are moist.     Pharynx: No oropharyngeal exudate or posterior oropharyngeal erythema.  Eyes:     Extraocular Movements: Extraocular movements intact.     Conjunctiva/sclera: Conjunctivae normal.     Pupils: Pupils are equal, round, and reactive to light.  Cardiovascular:     Rate and Rhythm: Normal rate and regular rhythm.     Heart sounds: No murmur heard. Pulmonary:     Effort: Pulmonary effort is normal. No respiratory distress.     Breath sounds: Wheezing, rhonchi and rales present.  Chest:     Chest wall: No tenderness.  Abdominal:     General: Abdomen is flat.     Palpations: Abdomen is soft.     Tenderness: There is no abdominal tenderness. There is no right CVA tenderness, left CVA tenderness, guarding or rebound.  Musculoskeletal:        General: No swelling or tenderness.     Cervical back: Neck supple. No tenderness.     Right lower leg: No edema.     Left lower leg: No edema.   Skin:    General: Skin is warm and dry.     Capillary Refill: Capillary refill takes less than 2 seconds.     Findings: No erythema or rash.  Neurological:     General: No focal deficit present.     Mental Status: He is alert.     Sensory: No sensory deficit.     Motor: No weakness.  Psychiatric:        Mood and Affect: Mood normal.     ED Results / Procedures / Treatments   Labs (all labs ordered are listed, but only abnormal results are displayed) Labs Reviewed  CBC WITH DIFFERENTIAL/PLATELET - Abnormal; Notable for the following components:      Result Value   WBC 13.6 (*)    RBC 4.18 (*)    Neutro Abs 11.6 (*)    Abs Immature Granulocytes 0.18 (*)    All other components within normal limits  COMPREHENSIVE METABOLIC PANEL - Abnormal; Notable for the following components:   CO2 33 (*)    Glucose, Bld 149 (*)    Calcium 8.8 (*)    Total Protein 6.4 (*)    Albumin 2.9 (*)    All other components within normal limits  BRAIN NATRIURETIC PEPTIDE - Abnormal; Notable for the following components:   B Natriuretic Peptide 175.1 (*)    All other components within normal limits  TROPONIN I (HIGH SENSITIVITY) - Abnormal; Notable for the following components:   Troponin I (High Sensitivity) 21 (*)    All other components within normal limits  SARS CORONAVIRUS 2 BY RT PCR  CULTURE, BLOOD (ROUTINE X 2)  CULTURE, BLOOD (ROUTINE X 2)  RESPIRATORY PANEL BY PCR  EXPECTORATED SPUTUM ASSESSMENT W GRAM STAIN, RFLX TO RESP C  LACTIC ACID, PLASMA  LACTIC ACID, PLASMA  CBC  CREATININE, SERUM  TROPONIN I (HIGH SENSITIVITY)    EKG EKG Interpretation  Date/Time:  Wednesday November 30 2021 09:24:19 EDT Ventricular Rate:  70 PR Interval:  166 QRS Duration: 142 QT Interval:  416 QTC Calculation: 449 R Axis:   -60 Text Interpretation: Sinus rhythm RBBB and LAFB LVH with secondary repolarization abnormality Anterior Q waves, possibly due to LVH when compared to prior, similar appearance.  No STEMI Confirmed by Antony Blackbird [phone]) on 11/30/2021 9:29:30 AM  Radiology CT Angio Chest/Abd/Pel for Dissection W and/or Wo Contrast  Result Date: 11/30/2021 CLINICAL DATA:  A 76 year old male presents for evaluation of suspected dissection in the setting of chest or back pain with aortic disease post recent repair. EXAM: CT ANGIOGRAPHY CHEST, ABDOMEN AND PELVIS TECHNIQUE: Non-contrast CT of the chest was initially obtained. Multidetector CT imaging through the chest, abdomen and pelvis was performed using the standard protocol during bolus administration of intravenous contrast. Multiplanar reconstructed images and MIPs were obtained and reviewed to evaluate the vascular anatomy. RADIATION DOSE REDUCTION: This exam was performed according to the departmental dose-optimization program which includes automated exposure control, adjustment of the mA and/or kV according to patient size and/or use of iterative reconstruction technique. CONTRAST:  8m OMNIPAQUE IOHEXOL 350 MG/ML SOLN COMPARISON:  None Available. 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 Noncontrast portion of the exam without evidence of intramural hematoma and with signs of extensive calcified aortic atherosclerosis. Ulcerated plaque along the under surface of the thoracic aortic arch with no change. No signs of dissection. No signs of vasculitis or aneurysm. Calcified and noncalcified plaque extends throughout the thoracic aorta. Standard three-vessel branching pattern in the chest. Heart size moderately enlarged with no change. No pericardial effusion or sign of pericardial nodularity. Central pulmonary vasculature is of normal caliber not well assessed due to phase of contrast enhancement currently. Mediastinum/Nodes: No adenopathy in the chest. Esophagus is grossly normal. Lungs/Pleura: Pulmonary emphysema as before. No consolidation. No sign of pleural effusion. Basilar atelectasis.  There is a small amount of material in RIGHT lower lobe bronchi and some mild bronchial wall thickening the lung bases. Musculoskeletal: See below for full musculoskeletal details. Review of the MIP images confirms the above findings. CTA ABDOMEN AND PELVIS FINDINGS VASCULAR Aorta: Signs of aorto bi-iliac  endo grafting. 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 Distal thoracic aorta maximal caliber of 3.8 x 3.5 cm at the level of the aortic hiatus is stable. No signs of dissection. Celiac: Atherosclerotic plaque at the origin of the celiac. Vessel is widely patent with classic anatomy. Splenic artery is unremarkable. SMA: SMA is widely patent despite atherosclerotic changes. No signs of dissection or aneurysm. No signs of vasculitis. Renals: Renal arteries are patent, accessory renal artery on the LEFT and RIGHT. IMA: Patent via collateral flow, occluded at the level of the aneurysm sac subtle increased density along the anterior aneurysm sac near the origin of the IMA makes it difficult to exclude the possibility of very subtle type 2 endoleak but density is unchanged compared to December of 2022 and there is no visible contrast opacification at the origin of the vessel. Inflow: Patent without evidence of aneurysm, dissection, vasculitis or significant stenosis. Veins: No obvious venous abnormality within the limitations of this arterial phase study. Proximal outflow: Iliac vessels into the upper thigh and into femoral vasculature are patent. Chronically occluded LEFT internal iliac artery. Review of the MIP images confirms the above findings. NON-VASCULAR Hepatobiliary: Is no hepatic enlargement or suspicious hepatic lesion. No pericholecystic stranding. Cholelithiasis. Pancreas: Normal contour without signs of inflammation. Spleen: Normal. Adrenals/Urinary Tract: Stable adrenal nodularity on the LEFT up to 2.5 cm greatest axial dimension. Density on  noncontrast portion of the evaluation is 8 Hounsfield units compatible with adrenal adenoma for which no dedicated imaging follow-up is recommended. RIGHT adrenal is normal. Renal cortical scarring bilaterally without hydronephrosis cystic LEFT renal lesion along the cortex of the LEFT kidney compatible with a cyst (image 188/5) intermediate density lesion arising from the anterior cortex measuring 14 mm and with density of 35 Hounsfield units is indeterminate. Stomach/Bowel: Colonic diverticulosis. No acute gastrointestinal process. Signs of prior small-bowel resection. Lymphatic: No adenopathy in the abdomen or in the pelvis. Reproductive: Unremarkable by CT. Other: No ascites. Musculoskeletal: Cyst is is marked glenohumeral degenerative changes. Signs of spinal fusion and spinal degenerative changes as before. Spinal fusion extending from L2 through L5 with similar degree of anterolisthesis of L4 on L5, grade 1 despite posterior interbody fusion and interbody cage placement at L4-5. Review of the MIP images confirms the above findings. IMPRESSION: 1. 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 No acute aortic process. 2. Tiny endoleak in the anterior endo sac would be difficult to entirely exclude though the endo sac is smaller than on previous imaging and the area of added density in the anterior aspect of the endo sac is stable potentially representing calcification. Suggest attention on follow-up. 3. Cholelithiasis without evidence of acute cholecystitis. 4. Stable LEFT adrenal adenoma. 5. Small anterior interpolar LEFT renal cortical lesion for which previous imaging suggested follow-up shows no interval change but may still represent a small solid renal neoplasm versus hemorrhagic cyst. Three to six-month follow-up with pre and postcontrast imaging may be helpful for further evaluation. 6. LEFT nephrolithiasis. 7. Cholelithiasis. Aortic Atherosclerosis  (ICD10-I70.0) and Emphysema (ICD10-J43.9). Electronically Signed   By: Zetta Bills M.D.   On: 11/30/2021 13:26   DG Chest 2 View  Result Date: 11/30/2021 CLINICAL DATA:  Low oxygen saturation. EXAM: CHEST - 2 VIEW COMPARISON:  Chest radiograph 06/06/2021. FINDINGS: Mild bibasilar opacities. No visible pleural effusions or pneumothorax. Unchanged cardiomediastinal silhouette. Cardiac valve replacement. Severe bilateral shoulder degenerative change, partially imaged. IMPRESSION:  Mild bibasilar atelectasis and/or pneumonia. Electronically Signed   By: Margaretha Sheffield M.D.   On: 11/30/2021 08:40    Procedures Procedures    CRITICAL CARE Performed by: Gwenyth Allegra [place] Total critical care time: 35 minutes Critical care time was exclusive of separately billable procedures and treating other patients. Critical care was necessary to treat or prevent imminent or life-threatening deterioration. Critical care was time spent personally by me on the following activities: development of treatment plan with patient and/or surrogate as well as nursing, discussions with consultants, evaluation of patient's response to treatment, examination of patient, obtaining history from patient or surrogate, ordering and performing treatments and interventions, ordering and review of laboratory studies, ordering and review of radiographic studies, pulse oximetry and re-evaluation of patient's condition.   Medications Ordered in ED Medications  Ampicillin-Sulbactam (UNASYN) 3 g in sodium chloride 0.9 % 100 mL IVPB (has no administration in time range)  acetaminophen (TYLENOL) CR tablet 650 mg (has no administration in time range)  traMADol (ULTRAM) tablet 50 mg (has no administration in time range)  atorvastatin (LIPITOR) tablet 40 mg (has no administration in time range)  metoprolol tartrate (LOPRESSOR) tablet 12.5 mg (has no administration in time range)  sertraline (ZOLOFT) tablet 50 mg (has no administration  in time range)  finasteride (PROSCAR) tablet 5 mg (has no administration in time range)  tamsulosin (FLOMAX) capsule 0.4 mg (has no administration in time range)  gabapentin (NEURONTIN) capsule 300 mg (has no administration in time range)  fluticasone furoate-vilanterol (BREO ELLIPTA) 200-25 MCG/ACT 1 puff (has no administration in time range)  guaiFENesin tablet 400 mg (has no administration in time range)  methylPREDNISolone sodium succinate (SOLU-MEDROL) 40 mg/mL injection 40 mg (has no administration in time range)    Followed by  predniSONE (DELTASONE) tablet 40 mg (has no administration in time range)  ipratropium-albuterol (DUONEB) 0.5-2.5 (3) MG/3ML nebulizer solution 3 mL (has no administration in time range)  albuterol (PROVENTIL) (2.5 MG/3ML) 0.083% nebulizer solution 2.5 mg (has no administration in time range)  heparin injection 5,000 Units (has no administration in time range)  ondansetron (ZOFRAN) tablet 4 mg (has no administration in time range)    Or  ondansetron (ZOFRAN) injection 4 mg (has no administration in time range)  azithromycin (ZITHROMAX) 500 mg in sodium chloride 0.9 % 250 mL IVPB (has no administration in time range)  morphine (PF) 4 MG/ML injection 4 mg (4 mg Intravenous Given 11/30/21 1046)  Ampicillin-Sulbactam (UNASYN) 3 g in sodium chloride 0.9 % 100 mL IVPB (0 g Intravenous Stopped 11/30/21 1120)  iohexol (OMNIPAQUE) 350 MG/ML injection 75 mL (75 mLs Intravenous Contrast Given 11/30/21 1250)    ED Course/ Medical Decision Making/ A&P                           Medical Decision Making Amount and/or Complexity of Data Reviewed Labs: ordered. Radiology: ordered.  Risk Prescription drug management. Decision regarding hospitalization.    Kyle Lewis is a 76 y.o. male with a past medical history significant for AAA status postrepair, carotid disease, previous stroke, iliac artery aneurysm, aortic stenosis status post TAVR, adrenal mass, meningioma  status postresection, previous appendectomy, CAD, hypertension, tobacco abuse, and chronic back pain who presents with 1 week of severe worsened back pain and several days of cough.  According to patient, his back pain is been worsened over the last week or 2.  He says it is greater than 10 out of  10 in severity and not responding to his home pain medicines.  He is denying any pain in his abdomen or chest but does report he had some cough for the last few days.  Has been productive of a clear phlegm.  He denies any fevers but does report some mild chills.  Denies nausea, vomiting, constipation, diarrhea, or urinary changes.  He reports he does not have any pain or swelling in his legs and reports the pain stays in the back.  He is denying any headache or neck pain or neck stiffness.  On arrival to the emergency department, patient was found to have oxygen saturation of 83% on room air.  He does not take oxygen at home but uses CPAP at night he reports.  On exam, lungs have coarseness, rales, and some wheezing.  Patient was given a breathing treatment in triage.  Chest and abdomen nontender.  Back is not focally tender and it does not provoke his discomfort.  He reports the pain is deeper.  Intact sensation, strength, and pulses in extremities.  Symmetric smile.  No focal neurologic deficits.  Slight murmur.  Clinically I am concerned that several things.  Given the patient new oxygen requirement with cough and chills I am concerned about possible pneumonia.  With his severe back pain worsening and his known history of aneurysmal disease, I am concerned this could be an aortic cause.  Patient had a chest x-ray in triage that shows evidence of opacities concerning for pneumonia.  Given his chills and cough we will treat with antibiotics.  I spoke with pharmacy to determine best antibiotic.  Patient will need admission for hypoxia in the setting of likely pneumonia however given the auscultation with rales I am  also concerned about possible fluid overload as well.  We will get labs including BNP, troponins, but will also get a dissection study given his aortic disease and severe back pain.  We will give some pain medicine and will monitor patient.  Anticipate admission after work-up is completed.  1:43 PM CTA showed no acute dissection and no convincing evidence of acute abnormality.  There is questionable tiny endoleak that appeared similar and could be just calcifications.  Given the patient's evidence of pneumonia with his cough, hypoxia, tachypnea, and exam, we will admit for further management           Final Clinical Impression(s) / ED Diagnoses Final diagnoses:  Community acquired pneumonia, unspecified laterality  Hypoxia     Clinical Impression: 1. Community acquired pneumonia, unspecified laterality   2. Hypoxia     Disposition: Admit  This note was prepared with assistance of Dragon voice recognition software. Occasional wrong-word or sound-a-like substitutions may have occurred due to the inherent limitations of voice recognition software.     Loomis Anacker, Gwenyth Allegra, MD 11/30/21 [phone]

## 2021-11-30 NOTE — ED Triage Notes (Addendum)
Pt bib ems with reports of lower back pain for prolonged amount of time. Tramadol and prednisone prescribed recently without relief. Pt has been taking his wifes hydrocodone to help with pain relief. Pt with audible wheezing in triage with oxygen saturations of 83%. Pt placed on 2L [place] with improvement to 94%. Hx COPD.  Vitals with EMS: 170/90 HR 70 RR 16 95% CBG 146

## 2021-11-30 NOTE — ED Notes (Signed)
Kyle Lewis calling to sit on the side of the bed. States he needs help getting up. Pt advised to wait for assistance.

## 2021-11-30 NOTE — Plan of Care (Signed)
  Problem: Education: Goal: Knowledge of disease or condition will improve Outcome: Progressing   Problem: Activity: Goal: Ability to tolerate increased activity will improve Outcome: Progressing Goal: Will verbalize the importance of balancing activity with adequate rest periods Outcome: Progressing   Problem: Respiratory: Goal: Ability to maintain a clear airway will improve Outcome: Progressing Goal: Levels of oxygenation will improve Outcome: Progressing Goal: Ability to maintain adequate ventilation will improve Outcome: Progressing   Problem: Health Behavior/Discharge Planning: Goal: Ability to manage health-related needs will improve Outcome: Progressing   Problem: Clinical Measurements: Goal: Cardiovascular complication will be avoided Outcome: Progressing   Problem: Pain Managment: Goal: General experience of comfort will improve Outcome: Progressing   Problem: Safety: Goal: Ability to remain free from injury will improve Outcome: Progressing   Problem: Skin Integrity: Goal: Risk for impaired skin integrity will decrease Outcome: Progressing

## 2021-11-30 NOTE — Progress Notes (Signed)
Patient placed on full face auto CPAP and tolerating well at this time.  RT will continue to monitor.

## 2021-11-30 NOTE — H&P (Signed)
History and Physical    Kyle Lewis:[id number] DOB: [birth year] DOA: 11/30/2021  PCP: [name], MD  Patient coming from: home  I have personally briefly reviewed patient's old medical records in [name]  Chief Complaint:  lower back pain, cough Kyle Lewis  HPI: Kyle Lewis is a 76 y.o. male with medical history significant of essential hypertension, obesity ,COPD, OSA on bipap,coronary artery disease, abdominal aortic aneurysm repair, TAVR surgery, dyslipidemia as well as continued tobacco abuse, chronic back pain, who presents to ED BIB EMS with increase back pain from chronic baseline x 1 week despite use of ultram and prednisone given to patient on 6/23 follow up for same complaint.  In addition to back pain patient also endorsed cough/chills and per ED notes was noted to have wheezing as well as hypoxemia to 83% in triage. Of note s/p  4L patient sat improved to 94%.  Patient is somewhat of a poor historian. He notes symptoms x 2 weeks but worse over the last 1 week. He denies fever and chills at home. But does endorse cough productive of yellow sputum. He denies any sick contact. He also denies n/v/d/ dysuria/ abdominal pain, presyncope or chest pain ,but noted worsening back pain .  ED Course:  Afeb, bp172/77, hr66, sat 93% on 2L [id number]: Mild bibasilar atelectasis and/or pneumonia. EKG: snr , rbbb, lbbb unchanged from prior  Ed course further complicated by increasing O2 need with desat per rn to 70's patient was transitioned to NRB at that time with sat improving to 100% .  Afeb, tx 100.4bp172/77, hr 66, r 20 sat 93% on 4-6L  transitioned to nrb due to desat Labs Wbc 13.6, hgb13.4 increase pmn11.6with left shift Bnp 175.1 Covid:neg Ce 21 Na 140, K 4.4 glu 149 cr 0.81 EKG: Nsr rbbb, lafb unchanged from prior  cxr IMPRESSION: Mild bibasilar atelectasis and/or pneumonia. CTA chest  . Signs of trans arterial aortic valve replacement and  aorto bi-iliac endograft placement. No acute aortic process. 2. Tiny endoleak in the anterior endo sac would be difficult to entirely exclude though the endo sac is smaller than on previous imaging and the area of added density in the anterior aspect of the endo sac is stable potentially representing calcification. Suggest attention on follow-up. 3. Cholelithiasis without evidence of acute cholecystitis. 4. Stable LEFT adrenal adenoma. 5. Small anterior interpolar LEFT renal cortical lesion for which previous imaging suggested follow-up shows no interval change but may still represent a small solid renal neoplasm versus hemorrhagic cyst. Three to six-month follow-up with pre and postcontrast imaging may be helpful for further evaluation. 6. LEFT nephrolithiasis. 7. Cholelithiasis.   Tx unasyn/morphine Review of Systems: As per HPI otherwise 10 point review of systems negative.   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   Abnormal gait 07/31/2019   Abscess of left leg    Acute on chronic diastolic heart failure (HCC)    Acute respiratory failure with hypoxia and hypercapnia ([place]) 05/27/2021   HC03   06/14/21    =  31  - 06/23/2021   Walked 250 ft  at a slow pace with a cane. Complained of back and left knee hurting with lowest sat 89% so d/c'd 02    Adenomatous colon polyp    Adrenal mass ([place])    2 benign appearing left adrenal adenomas noted on 01/13/15 CT   Allergy  seasonal   Anxiety 04/08/2020   Arthritis    Blister of multiple sites of lower extremity 01/10/2018   Body mass index (BMI) 34.0-34.9, adult 04/07/2020   BPH with obstruction/lower urinary tract symptoms    Overview:  Probable based on symptoms   CAD (coronary artery disease) 02/01/2018   Carotid arterial disease ([place]) 05/23/2012   Carotid artery occlusion    Chronic back pain    Cigarette smoker 09/05/2018   COLONIC POLYPS, ADENOMATOUS,  HX OF 03/17/2010   Qualifier: Diagnosis of  By: Harlon Ditty CMA (AAMA), Dottie     COPD GOLD II 04/07/2019   Quit smoking 03/2019 - PFT's  04/07/2019  FEV1 1.58 (57 % ) ratio 0.54  p 0 % improvement from saba p nothing prior to study with DLCO  15.22 (66%) corrects to 3.05 (74%)  for alv volume and FV curve concave classically     Degeneration of cervical intervertebral disc    Degeneration of lumbar intervertebral disc 06/14/2015   Depression 03/17/2010   Qualifier: Diagnosis of  By: Nelson-Smith CMA (AAMA), Dottie     Diverticulosis    DIVERTICULOSIS, COLON 03/17/2010   Qualifier: Diagnosis of  By: Harlon Ditty CMA (AAMA), Dottie     Dyspnea    Exogenous obesity 11/08/2013   Overview:  With a nine pound weight gain since his last visit   Heart murmur    History of back surgery    Rods and Screws in back   History of craniotomy 01/11/2018   History of left knee replacement    History of right MCA stroke 07/21/2014   Hyperlipidemia LDL goal <70 01/19/2018   Hypertension    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 Oneida Alar and 06/10/2015 last visit   Impairment of balance 10/27/2021   Inflammation of sacroiliac joint ([place]) 05/06/2019   Lower urinary tract symptoms (LUTS) 08/06/2017   Meningioma ([place])    Microscopic hematuria 11/08/2013   Mitral regurgitation 06/02/2021   Muscle weakness 10/27/2021   Neck pain 03/25/2019   Nocturia    OA (osteoarthritis) of knee 04/01/2012   OAB (overactive bladder) 01/19/2014   OSA (obstructive sleep apnea)    Bipap per [place] since ? 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 MAngelena Form& Dr. BCyndia Bent  Severe aortic stenosis 02/01/2018   ECHO 01/30/18 - Left ventricle: The cavity size was normal. There was moderate   focal basal hypertrophy of the septum. Systolic function was   vigorous. The estimated ejection fraction was in the range of 65%   to 70%. Wall motion was normal; there were no regional wall   motion abnormalities. Doppler parameters are consistent with   abnormal left ventricular relaxation (grade 1 diastolic   dysfun   Status post AAA (abdominal aortic aneurysm) repair 02/01/2018   Stroke ([place] 2013   tia no residual  deficit from   Tobacco use 11/08/2013   Ventral hernia 11/08/2013   Overview:  S/p repair with alloderm mesh and s/p SB resection    Past Surgical History:  Procedure Laterality Date   ABDOMINAL AORTIC ANEURYSM REPAIR  2019   [place]  as child   BACK SURGERY  2018   Rods and Screws in Back lower back   BRAIN MENINGIOMA EXCISION  1991   menigioma   CARPAL TUNNEL RELEASE     left   EYE SURGERY Bilateral    Cataract   IRRIGATION AND DEBRIDEMENT ABSCESS Left 08/25/2020   Procedure: IRRIGATION AND DEBRIDEMENT ABSCESS LEFT LEG;  Surgeon: Evelina Bucy, DPM;  Location: [facility];  Service: Podiatry;  Laterality: Left;   JOINT REPLACEMENT Left 04-01-12   Knee   LEFT HEART CATH AND CORONARY ANGIOGRAPHY N/A 09/15/2016   Procedure: Left Heart Cath and Coronary Angiography;  Surgeon: Nelva Bush, MD;  Location: [facility];  Service: Cardiovascular;  Laterality: N/A;   MAXIMUM ACCESS (MAS)POSTERIOR LUMBAR INTERBODY FUSION (PLIF) 1 LEVEL N/A 07/07/2015   Procedure:  POSTERIOR LUMBAR INTERBODY FUSION (PLIF) Lumbar Four-Five with Pedicle Screw Fixation Lumbar Two-Five;Laminectomy Lumbar Two-Five;  Surgeon: Eustace Moore, MD;   Location: [facility];  Service: Neurosurgery;  Laterality: N/A;   POSTERIOR LUMBAR INTERBODY FUSION (PLIF) Lumbar Four-Five with Pedicle Screw Fixation Lumbar Two-Five;Laminectomy Lumbar Two-Five   RIGHT/LEFT HEART CATH AND CORONARY ANGIOGRAPHY N/A 01/24/2018   Procedure: RIGHT/LEFT HEART CATH AND CORONARY ANGIOGRAPHY;  Surgeon: Nelva Bush, MD;  Location: [facility];  Service: Cardiovascular;  Laterality: N/A;   RIGHT/LEFT HEART CATH AND CORONARY ANGIOGRAPHY N/A 05/31/2021   Procedure: RIGHT/LEFT HEART CATH AND CORONARY ANGIOGRAPHY;  Surgeon: Burnell Blanks, MD;  Location: [facility];  Service: Cardiovascular;  Laterality: N/A;   TEE WITHOUT CARDIOVERSION N/A 06/07/2021   Procedure: TRANSESOPHAGEAL ECHOCARDIOGRAM (TEE);  Surgeon: Burnell Blanks, MD;  Location: [facility];  Service: Open Heart Surgery;  Laterality: N/A;   TONSILLECTOMY  as child   TOTAL KNEE ARTHROPLASTY  04/01/2012   Procedure: TOTAL KNEE ARTHROPLASTY;  Surgeon: Gearlean Alf, MD;  Location: [facility];  Service: Orthopedics;  Laterality: Left;   TRANSCATHETER AORTIC VALVE REPLACEMENT, TRANSFEMORAL Bilateral 06/07/2021   Procedure: TRANSCATHETER AORTIC VALVE REPLACEMENT, RIGHT TRANSFEMORAL;  Surgeon: Burnell Blanks, MD;  Location: [facility];  Service: Open Heart Surgery;  Laterality: Bilateral;   TRANSURETHRAL RESECTION OF PROSTATE     ULTRASOUND GUIDANCE FOR VASCULAR ACCESS Bilateral 06/07/2021   Procedure: ULTRASOUND GUIDANCE FOR VASCULAR ACCESS;  Surgeon: Burnell Blanks, MD;  Location: [facility];  Service: Open Heart Surgery;  Laterality: Bilateral;   UMBILICAL HERNIA REPAIR  2011     reports that he quit smoking about 6 months ago. His smoking use included cigarettes. He has a 30.00 pack-year smoking history. He has never used smokeless tobacco. He reports current alcohol use. He reports that he does not use drugs.  Allergies  Allergen Reactions   Cefazolin Rash and Other (See Comments)     The patient had surgery and was given cefazolin intraop. ~ 10 days later he developed a rash confirmed by biopsy to be consistent w/ drug eruption. We cannot know for sure, but this is the most likely agent.      Family History  Problem Relation Age of Onset   Heart disease Mother        Onset ~9 y/o   Hypertension Mother  Deceased from old age at 40   Hyperlipidemia Mother    Arthritis Mother    Diabetes Father        Deceased from old age at 65   Heart attack Father    Heart disease Father        CABG at age 71   Dementia Father 40   Arthritis Father    Prostate cancer Maternal Grandfather    Colon cancer Neg Hx    Esophageal cancer Neg Hx    Rectal cancer Neg Hx    Stomach cancer Neg Hx     Prior to Admission medications   Medication Sig Start Date End Date Taking? Authorizing Provider  acetaminophen (TYLENOL) 650 MG CR tablet Take 650 mg by mouth at bedtime as needed for pain.   Yes [provider]  amoxicillin (AMOXIL) 500 MG tablet Take 4 tablets (2,000 mg total) by mouth as directed. 1 HOUR PRIOR TO DENTAL PROCEDURES 06/15/21  Yes Eileen Stanford, PA-C  aspirin 81 MG EC tablet Take 1 tablet (81 mg total) by mouth daily. Swallow whole. Patient taking differently: Take 81 mg by mouth at bedtime. Swallow whole. 06/11/21  Yes Florencia Reasons, MD  atorvastatin (LIPITOR) 40 MG tablet Take 1 tablet (40 mg total) by mouth daily. Patient taking differently: Take 40 mg by mouth at bedtime. 08/12/21  Yes [name], MD  finasteride (PROSCAR) 5 MG tablet Take 1 tablet (5 mg total) by mouth daily. Patient taking differently: Take 5 mg by mouth at bedtime. 05/05/21  Yes Fargo, Amy E, NP  gabapentin (NEURONTIN) 300 MG capsule Take 1 capsule (300 mg total) by mouth 2 (two) times daily. Patient taking differently: Take 300 mg by mouth daily. 05/05/21  Yes Fargo, Amy E, NP  metoprolol tartrate (LOPRESSOR) 25 MG tablet Take 1/2 tablet (12.5 mg total) by mouth 2 (two) times  daily. 11/18/21  Yes [name], MD  naproxen sodium (ALEVE) 220 MG tablet Take 220 mg by mouth as needed (pain).   Yes [provider]  predniSONE (STERAPRED UNI-PAK 21 TAB) 10 MG (21) TBPK tablet Take 10 mg by mouth in the morning, at noon, and at bedtime. 11/26/21  Yes [provider]  sertraline (ZOLOFT) 50 MG tablet Take 1 tablet (50 mg total) by mouth daily. 06/15/21  Yes Eileen Stanford, PA-C  tamsulosin (FLOMAX) 0.4 MG CAPS capsule TAKE 1 CAPSULE BY MOUTH ONCE DAILY AFTER SUPPER Patient taking differently: Take 0.4 mg by mouth at bedtime. 07/18/21  Yes [name], MD  traMADol (ULTRAM) 50 MG tablet Take 50 mg by mouth every 6 (six) hours as needed (pain). 11/26/21  Yes [provider]  albuterol (VENTOLIN HFA) 108 (90 Base) MCG/ACT inhaler Inhale 2 puffs into the lungs every 6 (six) hours as needed for wheezing or shortness of breath. 06/10/21   Florencia Reasons, MD  ciprofloxacin (CIPRO) 500 MG tablet Take 1 tablet (500 mg total) by mouth 2 (two) times daily. Patient not taking: Reported on 11/30/2021 06/21/21   [name], MD  fluticasone furoate-vilanterol (BREO ELLIPTA) 200-25 MCG/ACT AEPB Inhale 1 puff into the lungs daily. Patient not taking: Reported on 11/30/2021 06/11/21   Florencia Reasons, MD  furosemide (LASIX) 40 MG tablet Take 1 tablet (40 mg total) by mouth as needed. Patient not taking: Reported on 11/30/2021 06/15/21   Eileen Stanford, PA-C  guaiFENesin 200 MG tablet Take 2 tablets (400 mg total) by mouth 2 (two) times daily. Patient not  taking: Reported on 11/30/2021 05/05/21   Yvonna Alanis, NP  ketoconazole (NIZORAL) 2 % cream Apply 1 fingertip amount to each foot daily. Patient not taking: Reported on 11/30/2021 06/27/21   Evelina Bucy, DPM  methocarbamol (ROBAXIN) 500 MG tablet Take 1 tablet (500 mg total) by mouth at bedtime as needed for muscle spasms. Patient not taking: Reported on 11/30/2021 05/05/21   Yvonna Alanis, NP  mirabegron ER  (MYRBETRIQ) 25 MG TB24 tablet Take 1 tablet (25 mg total) by mouth every morning. Patient not taking: Reported on 11/30/2021 09/27/21   [name], MD  nicotine (NICODERM CQ - DOSED IN MG/24 HOURS) 21 mg/24hr patch Place 1 patch (21 mg total) onto the skin daily as needed. Patient not taking: Reported on 11/30/2021 06/15/21   Eileen Stanford, PA-C  olmesartan (BENICAR) 40 MG tablet Take 1 tablet (40 mg total) by mouth daily. Please hold this medication, please discuss with your cardiology when should you restart this medication, Patient not taking: Reported on 11/30/2021 06/10/21   Florencia Reasons, MD  [place] into the lungs at bedtime. BIPAP    [provider]    Physical Exam: Vitals:   11/30/21 0926 11/30/21 0930 11/30/21 1115 11/30/21 1130  BP: (!) 155/70  (!) 108/59 (!) 146/74  Pulse: 71  70 74  Resp: (!) 22  (!) 21 (!) 24  Temp: 98.3 F (36.8 C)     TempSrc: Oral     SpO2: 99%  97% 98%  Weight:  99.8 kg    Height:  '5\' 8"'$  (1.727 m)       Vitals:   11/30/21 0926 11/30/21 0930 11/30/21 1115 11/30/21 1130  BP: (!) 155/70  (!) 108/59 (!) 146/74  Pulse: 71  70 74  Resp: (!) 22  (!) 21 (!) 24  Temp: 98.3 F (36.8 C)     TempSrc: Oral     SpO2: 99%  97% 98%  Weight:  99.8 kg    Height:  '5\' 8"'$  (1.727 m)    Constitutional: NAD, calm, comfortable no increase wob or conversation doe Eyes: PERRL, lids and conjunctivae normal ENMT: deferred  Neck: normal, supple, no masses, no thyromegaly Respiratory: diminshed bilaterally, +wheezing, no crackles. Normal respiratory effort. No accessory muscle use.  Cardiovascular: Regular rate and rhythm, no murmurs / rubs / gallops. No extremity edema. 2+ pedal pulses. No carotid bruits.  Abdomen: no tenderness, no masses palpated. No hepatosplenomegaly. Bowel sounds positive.  Musculoskeletal: no clubbing / cyanosis. No joint deformity upper and lower extremities. Good ROM, no contractures. Normal muscle tone.  Skin:  no rashes, lesions, ulcers. No induration Neurologic: CN 2-12 grossly intact. Sensation intact, . Strength 5/5 in all 4.  Psychiatric: Normal judgment and insight. Alert and oriented x 3. Normal mood.    Labs on Admission: I have personally reviewed following labs and imaging studies  CBC: Recent Labs  Lab 11/30/21 1000  WBC 13.6*  NEUTROABS 11.6*  HGB 13.4  HCT 40.0  MCV 95.7  PLT 355   Basic Metabolic Panel: Recent Labs  Lab 11/30/21 1000  NA 140  K 4.4  CL 100  CO2 33*  GLUCOSE 149*  BUN 23  CREATININE 0.81  CALCIUM 8.8*   GFR: Estimated Creatinine Clearance: 90.3 mL/min (by C-G formula based on SCr of 0.81 mg/dL). Liver Function Tests: Recent Labs  Lab 11/30/21 1000  AST 19  ALT 19  ALKPHOS 72  BILITOT 0.8  PROT 6.4*  ALBUMIN 2.9*  No results for input(s): "LIPASE", "AMYLASE" in the last 168 hours. No results for input(s): "AMMONIA" in the last 168 hours. Coagulation Profile: No results for input(s): "INR", "PROTIME" in the last 168 hours. Cardiac Enzymes: No results for input(s): "CKTOTAL", "CKMB", "CKMBINDEX", "TROPONINI" in the last 168 hours. BNP (last 3 results) No results for input(s): "PROBNP" in the last 8760 hours. HbA1C: No results for input(s): "HGBA1C" in the last 72 hours. CBG: No results for input(s): "GLUCAP" in the last 168 hours. Lipid Profile: No results for input(s): "CHOL", "HDL", "LDLCALC", "TRIG", "CHOLHDL", "LDLDIRECT" in the last 72 hours. Thyroid Function Tests: No results for input(s): "TSH", "T4TOTAL", "FREET4", "T3FREE", "THYROIDAB" in the last 72 hours. Anemia Panel: No results for input(s): "VITAMINB12", "FOLATE", "FERRITIN", "TIBC", "IRON", "RETICCTPCT" in the last 72 hours. Urine analysis:    Component Value Date/Time   COLORURINE YELLOW 06/07/2021 0445   APPEARANCEUR CLEAR 06/07/2021 0445   LABSPEC <1.005 (L) 06/07/2021 0445   PHURINE 7.0 06/07/2021 0445   GLUCOSEU NEGATIVE 06/07/2021 0445   HGBUR NEGATIVE  06/07/2021 0445   BILIRUBINUR Negative 06/14/2021 1605   KETONESUR NEGATIVE 06/07/2021 0445   PROTEINUR Positive (A) 06/14/2021 1605   PROTEINUR NEGATIVE 06/07/2021 0445   UROBILINOGEN 0.2 06/14/2021 1605   UROBILINOGEN 0.2 03/25/2012 1501   NITRITE Negative 06/14/2021 1605   NITRITE NEGATIVE 06/07/2021 0445   LEUKOCYTESUR Small (1+) (A) 06/14/2021 1605   LEUKOCYTESUR NEGATIVE 06/07/2021 0445    Radiological Exams on Admission: CT Angio Chest/Abd/Pel for Dissection W and/or Wo Contrast  Result Date: 11/30/2021 CLINICAL DATA:  A 76 year old male presents for evaluation of suspected dissection in the setting of chest or back pain with aortic disease post recent repair. EXAM: CT ANGIOGRAPHY CHEST, ABDOMEN AND PELVIS TECHNIQUE: Non-contrast CT of the chest was initially obtained. Multidetector CT imaging through the chest, abdomen and pelvis was performed using the standard protocol during bolus administration of intravenous contrast. Multiplanar reconstructed images and MIPs were obtained and reviewed to evaluate the vascular anatomy. RADIATION DOSE REDUCTION: This exam was performed according to the departmental dose-optimization program which includes automated exposure control, adjustment of the mA and/or kV according to patient size and/or use of iterative reconstruction technique. CONTRAST:  51m OMNIPAQUE IOHEXOL 350 MG/ML SOLN COMPARISON:  None Available. 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 Noncontrast portion of the exam without evidence of intramural hematoma and with signs of extensive calcified aortic atherosclerosis. Ulcerated plaque along the under surface of the thoracic aortic arch with no change. No signs of dissection. No signs of vasculitis or aneurysm. Calcified and noncalcified plaque extends throughout the thoracic aorta. Standard three-vessel branching pattern in the chest. Heart size moderately enlarged with no change. No  pericardial effusion or sign of pericardial nodularity. Central pulmonary vasculature is of normal caliber not well assessed due to phase of contrast enhancement currently. Mediastinum/Nodes: No adenopathy in the chest. Esophagus is grossly normal. Lungs/Pleura: Pulmonary emphysema as before. No consolidation. No sign of pleural effusion. Basilar atelectasis. There is a small amount of material in RIGHT lower lobe bronchi and some mild bronchial wall thickening the lung bases. Musculoskeletal: See below for full musculoskeletal details. Review of the MIP images confirms the above findings. CTA ABDOMEN AND PELVIS FINDINGS VASCULAR Aorta: Signs of aorto bi-iliac endo grafting. 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 Distal thoracic aorta maximal caliber of 3.8 x 3.5 cm at the level of the aortic hiatus  is stable. No signs of dissection. Celiac: Atherosclerotic plaque at the origin of the celiac. Vessel is widely patent with classic anatomy. Splenic artery is unremarkable. SMA: SMA is widely patent despite atherosclerotic changes. No signs of dissection or aneurysm. No signs of vasculitis. Renals: Renal arteries are patent, accessory renal artery on the LEFT and RIGHT. IMA: Patent via collateral flow, occluded at the level of the aneurysm sac subtle increased density along the anterior aneurysm sac near the origin of the IMA makes it difficult to exclude the possibility of very subtle type 2 endoleak but density is unchanged compared to December of 2022 and there is no visible contrast opacification at the origin of the vessel. Inflow: Patent without evidence of aneurysm, dissection, vasculitis or significant stenosis. Veins: No obvious venous abnormality within the limitations of this arterial phase study. Proximal outflow: Iliac vessels into the upper thigh and into femoral vasculature are patent. Chronically occluded LEFT internal iliac artery. Review  of the MIP images confirms the above findings. NON-VASCULAR Hepatobiliary: Is no hepatic enlargement or suspicious hepatic lesion. No pericholecystic stranding. Cholelithiasis. Pancreas: Normal contour without signs of inflammation. Spleen: Normal. Adrenals/Urinary Tract: Stable adrenal nodularity on the LEFT up to 2.5 cm greatest axial dimension. Density on noncontrast portion of the evaluation is 8 Hounsfield units compatible with adrenal adenoma for which no dedicated imaging follow-up is recommended. RIGHT adrenal is normal. Renal cortical scarring bilaterally without hydronephrosis cystic LEFT renal lesion along the cortex of the LEFT kidney compatible with a cyst (image 188/5) intermediate density lesion arising from the anterior cortex measuring 14 mm and with density of 35 Hounsfield units is indeterminate. Stomach/Bowel: Colonic diverticulosis. No acute gastrointestinal process. Signs of prior small-bowel resection. Lymphatic: No adenopathy in the abdomen or in the pelvis. Reproductive: Unremarkable by CT. Other: No ascites. Musculoskeletal: Cyst is is marked glenohumeral degenerative changes. Signs of spinal fusion and spinal degenerative changes as before. Spinal fusion extending from L2 through L5 with similar degree of anterolisthesis of L4 on L5, grade 1 despite posterior interbody fusion and interbody cage placement at L4-5. Review of the MIP images confirms the above findings. IMPRESSION: 1. 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 No acute aortic process. 2. Tiny endoleak in the anterior endo sac would be difficult to entirely exclude though the endo sac is smaller than on previous imaging and the area of added density in the anterior aspect of the endo sac is stable potentially representing calcification. Suggest attention on follow-up. 3. Cholelithiasis without evidence of acute cholecystitis. 4. Stable LEFT adrenal adenoma. 5. Small anterior  interpolar LEFT renal cortical lesion for which previous imaging suggested follow-up shows no interval change but may still represent a small solid renal neoplasm versus hemorrhagic cyst. Three to six-month follow-up with pre and postcontrast imaging may be helpful for further evaluation. 6. LEFT nephrolithiasis. 7. Cholelithiasis. Aortic Atherosclerosis (ICD10-I70.0) and Emphysema (ICD10-J43.9). Electronically Signed   By: Zetta Bills M.D.   On: 11/30/2021 13:26   DG Chest 2 View  Result Date: 11/30/2021 CLINICAL DATA:  Low oxygen saturation. EXAM: CHEST - 2 VIEW COMPARISON:  Chest radiograph 06/06/2021. FINDINGS: Mild bibasilar opacities. No visible pleural effusions or pneumothorax. Unchanged cardiomediastinal silhouette. Cardiac valve replacement. Severe bilateral shoulder degenerative change, partially imaged. IMPRESSION: Mild bibasilar atelectasis and/or pneumonia. Electronically Signed   By: Margaretha Sheffield M.D.   On: 11/30/2021 08:40    EKG: Independently reviewed. See above  Assessment/Plan   CAP presumed bacterial with associated hypoxic  respiratory failure  -admit to progressive care -place on CAP protocol  - unasyn/azithromax  due to all history  -pulmonary toilet  -wean O2 as able  -f/u respiratory/ blood cultures    Acute COPD exacerbation with hypoxemia  -iv solumedrol  with transition to prednisone  -pulmonary toilet  - nebs standing and prn  -resume home inhaled steroid   Acute on chronic back pain  -supportive care   HTN -stable resume home regimen    OSA  -on bipap qhs   CAD  -no active issues  -initial ischemic evaluation unrevealing -repeat CE pending    Abdominal aortic aneurysm repair -CTA notes chronic endo leak no acute findings   HLD -continue statin   Tobacco abuse -encourage cessation  -education while in house    DVT prophylaxis: heparin Code Status: Full Family Communication: none at bedside Disposition Plan: patient  expected to  be admitted greater than 2 midnights Consults called: n/a Admission status: progressive   Clance Boll MD [facility]  If 7PM-7AM, please contact night-coverage [email or web address] Password [email or web address]  11/30/2021, 2:35 PM

## 2021-12-01 DIAGNOSIS — I10 Essential (primary) hypertension: Secondary | ICD-10-CM | POA: Diagnosis present

## 2021-12-01 DIAGNOSIS — J441 Chronic obstructive pulmonary disease with (acute) exacerbation: Secondary | ICD-10-CM

## 2021-12-01 DIAGNOSIS — J189 Pneumonia, unspecified organism: Secondary | ICD-10-CM | POA: Diagnosis not present

## 2021-12-01 DIAGNOSIS — J9601 Acute respiratory failure with hypoxia: Secondary | ICD-10-CM

## 2021-12-01 HISTORY — DX: Acute respiratory failure with hypoxia: J96.01

## 2021-12-01 HISTORY — DX: Essential (primary) hypertension: I10

## 2021-12-01 HISTORY — DX: Chronic obstructive pulmonary disease with (acute) exacerbation: J44.1

## 2021-12-01 LAB — COMPREHENSIVE METABOLIC PANEL
ALT: 18 U/L (ref 0–44)
AST: 15 U/L (ref 15–41)
Albumin: 2.4 g/dL — ABNORMAL LOW (ref 3.5–5.0)
Alkaline Phosphatase: 68 U/L (ref 38–126)
Anion gap: 8 (ref 5–15)
BUN: 29 mg/dL — ABNORMAL HIGH (ref 8–23)
CO2: 33 mmol/L — ABNORMAL HIGH (ref 22–32)
Calcium: 8.4 mg/dL — ABNORMAL LOW (ref 8.9–10.3)
Chloride: 101 mmol/L (ref 98–111)
Creatinine, Ser: 0.93 mg/dL (ref 0.61–1.24)
GFR, Estimated: >60 mL/min (ref >60)
Glucose, Bld: 167 mg/dL — ABNORMAL HIGH (ref 70–99)
Potassium: 4.7 mmol/L (ref 3.5–5.1)
Sodium: 142 mmol/L (ref 135–145)
Total Bilirubin: 0.8 mg/dL (ref 0.3–1.2)
Total Protein: 5.8 g/dL — ABNORMAL LOW (ref 6.5–8.1)

## 2021-12-01 LAB — CBC
HCT: 36.7 % — ABNORMAL LOW (ref 39.0–52.0)
Hemoglobin: 12.4 g/dL — ABNORMAL LOW (ref 13.0–17.0)
MCH: 32.2 pg (ref 26.0–34.0)
MCHC: 33.8 g/dL (ref 30.0–36.0)
MCV: 95.3 fL (ref 80.0–100.0)
Platelets: 172 10*3/uL (ref 150–400)
RBC: 3.85 MIL/uL — ABNORMAL LOW (ref 4.22–5.81)
RDW: 14.4 % (ref 11.5–15.5)
WBC: 12.2 10*3/uL — ABNORMAL HIGH (ref 4.0–10.5)
nRBC: 0 % (ref 0.0–0.2)

## 2021-12-01 LAB — BLOOD CULTURE ID PANEL (REFLEXED) - BCID2

## 2021-12-01 LAB — STREP PNEUMONIAE URINARY ANTIGEN: Strep Pneumo Urinary Antigen: NEGATIVE

## 2021-12-01 LAB — GLUCOSE, CAPILLARY: Glucose-Capillary: 171 mg/dL — ABNORMAL HIGH (ref 70–99)

## 2021-12-01 MED ORDER — SODIUM CHLORIDE 0.9 % IV SOLN: INTRAVENOUS | Status: DC | PRN

## 2021-12-01 MED ORDER — VANCOMYCIN HCL 1750 MG/350ML IV SOLN
1750.0000 mg | Freq: Once | INTRAVENOUS | Status: AC
  Administered 2021-12-01: 1750 mg via INTRAVENOUS
  Filled 2021-12-01: qty 350

## 2021-12-01 MED ORDER — VANCOMYCIN HCL 1250 MG/250ML IV SOLN
1250.0000 mg | INTRAVENOUS | Status: DC
  Administered 2021-12-02 – 2021-12-04 (×3): 1250 mg via INTRAVENOUS
  Filled 2021-12-01 (×4): qty 250

## 2021-12-01 MED ORDER — AMLODIPINE BESYLATE 5 MG PO TABS
5.0000 mg | ORAL_TABLET | Freq: Every day | ORAL | Status: DC
  Administered 2021-12-01 – 2021-12-02 (×2): 5 mg via ORAL
  Filled 2021-12-01 (×2): qty 1

## 2021-12-01 MED ORDER — AZITHROMYCIN 250 MG PO TABS
500.0000 mg | ORAL_TABLET | Freq: Every evening | ORAL | Status: AC
Start: 2021-12-01 — End: 2021-12-04
  Administered 2021-12-01 – 2021-12-04 (×4): 500 mg via ORAL
  Filled 2021-12-01 (×4): qty 2

## 2021-12-01 MED ORDER — HYDRALAZINE HCL 20 MG/ML IJ SOLN
INTRAMUSCULAR | Status: AC
  Filled 2021-12-01: qty 1

## 2021-12-01 MED ORDER — METOPROLOL TARTRATE 25 MG PO TABS
25.0000 mg | ORAL_TABLET | Freq: Two times a day (BID) | ORAL | Status: DC
  Administered 2021-12-01 – 2021-12-16 (×29): 25 mg via ORAL
  Filled 2021-12-01 (×30): qty 1

## 2021-12-01 MED ORDER — METHYLPREDNISOLONE SODIUM SUCC 125 MG IJ SOLR
80.0000 mg | INTRAMUSCULAR | Status: DC
  Administered 2021-12-01 – 2021-12-03 (×3): 80 mg via INTRAVENOUS
  Filled 2021-12-01 (×3): qty 2

## 2021-12-01 MED ORDER — TRAMADOL HCL 50 MG PO TABS
50.0000 mg | ORAL_TABLET | Freq: Four times a day (QID) | ORAL | Status: DC | PRN
  Administered 2021-12-02: 50 mg via ORAL
  Filled 2021-12-01: qty 1

## 2021-12-01 MED ORDER — HYDRALAZINE HCL 20 MG/ML IJ SOLN
10.0000 mg | Freq: Four times a day (QID) | INTRAMUSCULAR | Status: DC | PRN
  Administered 2021-12-01: 10 mg via INTRAVENOUS

## 2021-12-01 NOTE — Progress Notes (Signed)
[facility]                                                                              Kyle Lewis, is a 76 y.o. male, DOB - [birth year], YTK:[id number] Admit date - 11/30/2021    Outpatient Primary MD for the patient is [name], MD  LOS - 1  days  Chief Complaint  Patient presents with   Back Pain       Brief summary   Patient is a 76 year old male with hypertension, obesity, COPD, OSA, CAD, AAA repair, TAVR, hyperlipidemia, tobacco use, chronic back pain presented to ED with worsening back pain despite use of Ultram and prednisone given to the patient on 6/23.  Patient also reported cough with chills and wheezing.  Patient was hypoxic in ED to 83% on room air.  Patient was placed on 4 L O2 via [place] and sats improved to 94%.  Patient reported symptoms for 2 weeks and worse in the last 1 week. Chest x-ray showed mild bibasilar atelectasis and/or pneumonia.  In ED, patient was noted to have increasing O2 needs and desatted to 70%.  He was transitioned to NRB.  Assessment & Plan    Principal Problem:   Acute respiratory failure with hypoxia (HCC) likely due to community-acquired pneumonia and COPD exacerbation -Patient noted to be wheezing on exam, started on scheduled DuoNebs, IV Solu-Medrol.  Continue Breo Ellipta, flutter valve -Continue IV Unasyn -Wean O2 as tolerated.  Respiratory virus panel negative.  Active Problems:   CAP (community acquired pneumonia) -As #1, wean O2 as tolerated -Obtain urine strep antigen, urine Legionella antigen -Blood cultures negative so far    Accelerated hypertension -BP noted to be 210/108 on my exam, given hydralazine 10 mg IV x1, started PRN with parameters -Increase metoprolol to 25 mg twice daily, added Norvasc 5 mg daily -2D echo in 07/2021 had shown EF of 60 to 65%, G1 DD    COPD with acute exacerbation ([place]) -Noted to be wheezing on exam, started on IV Solu-Medrol, scheduled DuoNebs, continue Breo -Flutter  valve    OSA (obstructive sleep apnea) -Continue CPAP    Tobacco use -Counseled smoking cessation    Hyperlipidemia LDL goal <70 -Continue statin  AAA repair, S/P TAVR (transcatheter aortic valve replacement) -CTA noted no acute dissection or acute aortic process.  A tiny endoleak in the anterior endosac would be difficult to exclude, smaller than on previous imaging, potentially representing calcification, recommended follow-up -Explained above to the patient and will follow-up with cardiology outpatient.  Obesity Estimated body mass index is 33.69 kg/m as calculated from the following:   Height as of this encounter: '5\' 8"'$  (1.727 m).   Weight as of this encounter: 100.5 kg.  Code Status: Full code DVT Prophylaxis:  heparin injection 5,000 Units Start: 11/30/21 1630   Level of Care: Level of care: Progressive Family Communication: Updated patient   Disposition Plan:      Remains inpatient appropriate: Hopefully DC in next 24 to 48 hours if continues to improve and weaning of O2   Procedures:  None  Consultants:   None  Antimicrobials:   Anti-infectives (  From admission, onward)    Start     Dose/Rate Route Frequency Ordered Stop   12/01/21 1800  azithromycin (ZITHROMAX) tablet 500 mg        500 mg Oral Every evening 12/01/21 0832 12/05/21 1759   11/30/21 1630  Ampicillin-Sulbactam (UNASYN) 3 g in sodium chloride 0.9 % 100 mL IVPB        3 g 200 mL/hr over 30 Minutes Intravenous Every 6 hours 11/30/21 1621     11/30/21 1630  azithromycin (ZITHROMAX) 500 mg in sodium chloride 0.9 % 250 mL IVPB  Status:  Discontinued        500 mg 250 mL/hr over 60 Minutes Intravenous Every 24 hours 11/30/21 1622 12/01/21 0832   11/30/21 0930  Ampicillin-Sulbactam (UNASYN) 3 g in sodium chloride 0.9 % 100 mL IVPB        3 g 200 mL/hr over 30 Minutes Intravenous  Once 11/30/21 0923 11/30/21 1120          Medications  amLODipine  5 mg Oral Daily   atorvastatin  40 mg Oral  Daily   azithromycin  500 mg Oral QPM   finasteride  5 mg Oral Daily   fluticasone furoate-vilanterol  1 puff Inhalation Daily   gabapentin  300 mg Oral BID   guaiFENesin  400 mg Oral BID   heparin  5,000 Units Subcutaneous Q8H   ipratropium-albuterol  3 mL Nebulization TID   methylPREDNISolone (SOLU-MEDROL) injection  80 mg Intravenous Q24H   metoprolol tartrate  25 mg Oral BID   sertraline  50 mg Oral Daily   tamsulosin  0.4 mg Oral QHS      Subjective:   Kyle Lewis was seen and examined today.  BP noted to be elevated in 200s at the time of my exam to the wheezing, shortness of breath.  Patient denies dizziness, chest pain, abdominal pain, N/V/D/C, new weakness.  No fevers.,  Objective:   Vitals:   12/01/21 0502 12/01/21 0739 12/01/21 0847 12/01/21 0901  BP: (!) 154/70 (!) 193/79  (!) 191/83  Pulse: (!) 58 60 72 69  Resp: 15 15 (!) 22 20  Temp: (!) 96.9 F (36.1 C) (!) 97.4 F (36.3 C)    TempSrc: Axillary Axillary    SpO2: 96% 97% 91% 95%  Weight:      Height:        Intake/Output Summary (Last 24 hours) at 12/01/2021 1046 Last data filed at 12/01/2021 0500 Gross per 24 hour  Intake 539.61 ml  Output 450 ml  Net 89.61 ml     Wt Readings from Last 3 Encounters:  12/01/21 100.5 kg  11/25/21 97.8 kg  11/09/21 97.8 kg     Exam General: Alert and oriented x 3, NAD Cardiovascular: S1 S2 auscultated,  RRR Respiratory: Expiratory wheezing Gastrointestinal: Soft, nontender, nondistended, + bowel sounds Ext: no pedal edema bilaterally Neuro: Strength 5/5 upper and lower extremities bilaterally Psych: Normal affect and demeanor, alert and oriented x3     Data Reviewed:  I have personally reviewed following labs    CBC Lab Results  Component Value Date   WBC 12.2 (H) 12/01/2021   RBC 3.85 (L) 12/01/2021   HGB 12.4 (L) 12/01/2021   HCT 36.7 (L) 12/01/2021   MCV 95.3 12/01/2021   MCH 32.2 12/01/2021   PLT 172 12/01/2021   MCHC 33.8 12/01/2021   RDW  14.4 12/01/2021   LYMPHSABS 0.8 11/30/2021   MONOABS 0.9 11/30/2021   EOSABS 0.0 11/30/2021  BASOSABS 0.0 64/68/0321     Last metabolic panel Lab Results  Component Value Date   NA 142 12/01/2021   K 4.7 12/01/2021   CL 101 12/01/2021   CO2 33 (H) 12/01/2021   BUN 29 (H) 12/01/2021   CREATININE 0.93 12/01/2021   GLUCOSE 167 (H) 12/01/2021   GFRNONAA >60 12/01/2021   GFRAA 107 04/08/2020   CALCIUM 8.4 (L) 12/01/2021   PROT 5.8 (L) 12/01/2021   ALBUMIN 2.4 (L) 12/01/2021   LABGLOB 2.7 09/25/2019   AGRATIO 1.4 09/25/2019   BILITOT 0.8 12/01/2021   ALKPHOS 68 12/01/2021   AST 15 12/01/2021   ALT 18 12/01/2021   ANIONGAP 8 12/01/2021    CBG (last 3)  Recent Labs    12/01/21 0523  GLUCAP 171*      Coagulation Profile: No results for input(s): "INR", "PROTIME" in the last 168 hours.   Radiology Studies: I have personally reviewed the imaging studies  CT Angio Chest/Abd/Pel for Dissection W and/or Wo Contrast  Result Date: 11/30/2021 CLINICAL DATA:  A 76 year old male presents for evaluation of suspected dissection in the setting of chest or back pain with aortic disease post recent repair. EXAM: CT ANGIOGRAPHY CHEST, ABDOMEN AND PELVIS TECHNIQUE: Non-contrast CT of the chest was initially obtained. Multidetector CT imaging through the chest, abdomen and pelvis was performed using the standard protocol during bolus administration of intravenous contrast. Multiplanar reconstructed images and MIPs were obtained and reviewed to evaluate the vascular anatomy. RADIATION DOSE REDUCTION: This exam was performed according to the departmental dose-optimization program which includes automated exposure control, adjustment of the mA and/or kV according to patient size and/or use of iterative reconstruction technique. CONTRAST:  47m OMNIPAQUE IOHEXOL 350 MG/ML SOLN COMPARISON:  None Available. 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  Noncontrast portion of the exam without evidence of intramural hematoma and with signs of extensive calcified aortic atherosclerosis. Ulcerated plaque along the under surface of the thoracic aortic arch with no change. No signs of dissection. No signs of vasculitis or aneurysm. Calcified and noncalcified plaque extends throughout the thoracic aorta. Standard three-vessel branching pattern in the chest. Heart size moderately enlarged with no change. No pericardial effusion or sign of pericardial nodularity. Central pulmonary vasculature is of normal caliber not well assessed due to phase of contrast enhancement currently. Mediastinum/Nodes: No adenopathy in the chest. Esophagus is grossly normal. Lungs/Pleura: Pulmonary emphysema as before. No consolidation. No sign of pleural effusion. Basilar atelectasis. There is a small amount of material in RIGHT lower lobe bronchi and some mild bronchial wall thickening the lung bases. Musculoskeletal: See below for full musculoskeletal details. Review of the MIP images confirms the above findings. CTA ABDOMEN AND PELVIS FINDINGS VASCULAR Aorta: Signs of aorto bi-iliac endo grafting. 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 Distal thoracic aorta maximal caliber of 3.8 x 3.5 cm at the level of the aortic hiatus is stable. No signs of dissection. Celiac: Atherosclerotic plaque at the origin of the celiac. Vessel is widely patent with classic anatomy. Splenic artery is unremarkable. SMA: SMA is widely patent despite atherosclerotic changes. No signs of dissection or aneurysm. No signs of vasculitis. Renals: Renal arteries are patent, accessory renal artery on the LEFT and RIGHT. IMA: Patent via collateral flow, occluded at the level of the aneurysm sac subtle increased density along the anterior aneurysm sac near the origin of the IMA makes it difficult to  exclude the possibility of very subtle type 2 endoleak but  density is unchanged compared to December of 2022 and there is no visible contrast opacification at the origin of the vessel. Inflow: Patent without evidence of aneurysm, dissection, vasculitis or significant stenosis. Veins: No obvious venous abnormality within the limitations of this arterial phase study. Proximal outflow: Iliac vessels into the upper thigh and into femoral vasculature are patent. Chronically occluded LEFT internal iliac artery. Review of the MIP images confirms the above findings. NON-VASCULAR Hepatobiliary: Is no hepatic enlargement or suspicious hepatic lesion. No pericholecystic stranding. Cholelithiasis. Pancreas: Normal contour without signs of inflammation. Spleen: Normal. Adrenals/Urinary Tract: Stable adrenal nodularity on the LEFT up to 2.5 cm greatest axial dimension. Density on noncontrast portion of the evaluation is 8 Hounsfield units compatible with adrenal adenoma for which no dedicated imaging follow-up is recommended. RIGHT adrenal is normal. Renal cortical scarring bilaterally without hydronephrosis cystic LEFT renal lesion along the cortex of the LEFT kidney compatible with a cyst (image 188/5) intermediate density lesion arising from the anterior cortex measuring 14 mm and with density of 35 Hounsfield units is indeterminate. Stomach/Bowel: Colonic diverticulosis. No acute gastrointestinal process. Signs of prior small-bowel resection. Lymphatic: No adenopathy in the abdomen or in the pelvis. Reproductive: Unremarkable by CT. Other: No ascites. Musculoskeletal: Cyst is is marked glenohumeral degenerative changes. Signs of spinal fusion and spinal degenerative changes as before. Spinal fusion extending from L2 through L5 with similar degree of anterolisthesis of L4 on L5, grade 1 despite posterior interbody fusion and interbody cage placement at L4-5. Review of the MIP images confirms the above findings. IMPRESSION: 1. Signs of trans arterial aortic valve replacement and aorto  bi-iliac endograft placement. No acute aortic process. 2. Tiny endoleak in the anterior endo sac would be difficult to entirely exclude though the endo sac is smaller than on previous imaging and the area of added density in the anterior aspect of the endo sac is stable potentially representing calcification. Suggest attention on follow-up. 3. Cholelithiasis without evidence of acute cholecystitis. 4. Stable LEFT adrenal adenoma. 5. Small anterior interpolar LEFT renal cortical lesion for which previous imaging suggested follow-up shows no interval change but may still represent a small solid renal neoplasm versus hemorrhagic cyst. Three to six-month follow-up with pre and postcontrast imaging may be helpful for further evaluation. 6. LEFT nephrolithiasis. 7. Cholelithiasis. Aortic Atherosclerosis (ICD10-I70.0) and Emphysema (ICD10-J43.9). Electronically Signed   By: Zetta Bills M.D.   On: 11/30/2021 13:26   DG Chest 2 View  Result Date: 11/30/2021 CLINICAL DATA:  Low oxygen saturation. EXAM: CHEST - 2 VIEW COMPARISON:  Chest radiograph 06/06/2021. FINDINGS: Mild bibasilar opacities. No visible pleural effusions or pneumothorax. Unchanged cardiomediastinal silhouette. Cardiac valve replacement. Severe bilateral shoulder degenerative change, partially imaged. IMPRESSION: Mild bibasilar atelectasis and/or pneumonia. Electronically Signed   By: Margaretha Sheffield M.D.   On: 11/30/2021 08:40       Kanika Bungert M.D. [facility] 12/01/2021, 10:46 AM  Available via Epic secure chat 7am-7pm After 7 pm, please refer to night coverage provider listed on amion.

## 2021-12-01 NOTE — Progress Notes (Signed)
Pharmacy Antibiotic Note  Kyle Lewis is a 76 y.o. male admitted on 11/30/2021 with [facility] bacteremia Pharmacy has been consulted for Vancomycin dosing.  GPC in 2 of 4 bottles, 1 aerobic and 1 anaerobic bottle of opposite sets - hard to tell if contamination or pathogenic so will start Vancomycin temporarily while awaiting further culture updates and speciation.  Plan: - Vancomycin 1750 mg IV x 1 dose followed by 1250 mg IV every 24h (eAUC 418, SCr 0.93, Vd 0.5) - Continue Unasyn + Azithro per MD - Will follow-up on culture speciation to determine if clinically significant.   Height: '5\' 8"'$  (172.7 cm) Weight: 100.5 kg (221 lb 9 oz) (bed weight) IBW/kg (Calculated) : 68.4  Temp (24hrs), Avg:98.2 F (36.8 C), Min:96.9 F (36.1 C), Max:100.4 F (38 C)  Recent Labs  Lab 11/30/21 1000 12/01/21 0402  WBC 13.6* 12.2*  CREATININE 0.81 0.93  LATICACIDVEN 1.2  --     Estimated Creatinine Clearance: 78.8 mL/min (by C-G formula based on SCr of 0.93 mg/dL).    Allergies  Allergen Reactions   Cefazolin Rash and Other (See Comments)    The patient had surgery and was given cefazolin intraop. ~ 10 days later he developed a rash confirmed by biopsy to be consistent w/ drug eruption. We cannot know for sure, but this is the most likely agent.      Antimicrobials this admission: Unasyn 6/28 >> Aizthro 6/28 >> Vanc 6/29 >>  Dose adjustments this admission: N/a  Microbiology results: 6/29 COVID >> neg 6/28 RVP >> negative 6/28 BCx >> 2/4 GPC (1 aerobic, 1 anaerobic) 6/28 RCx >>  Thank you for allowing pharmacy to be a part of this patient's care.  Alycia Rossetti, PharmD, BCPS Infectious Diseases Clinical Pharmacist 12/01/2021 4:09 PM   **Pharmacist phone directory can now be found on [email or web address] ([email or web address]).  Listed under [place].

## 2021-12-01 NOTE — Progress Notes (Signed)
2/4 GPC in clusters  [place] (BCID)  Kyle Lewis is an 76 y.o. male who presented to [facility] on 11/30/2021 with a chief complaint of lower back pain, cough/chills  Assessment: 75 YOM being treated for CAP now with 1 aerobic and 1 anaerobic bottle of opposite sets growing GPC in clusters with BCID undetected. Hard to tell if this is contamination or pathogenic - since undetected on BCID the aerobic bottle could be something like micrococcus or aerococcus, anaerobic possible peptostreptococcus.  Name of physician (or Provider) Contacted: Rai  Current antibiotics: Unasyn + Azithromycin  Changes to prescribed antibiotics recommended:  Add Vancomycin until organisms are speciated Continue Unasyn + Azithromycin  Results for orders placed or performed during the hospital encounter of 11/30/21  Blood Culture ID Panel (Reflexed) (Collected: 11/30/2021 10:10 AM)  Result Value Ref Range   Enterococcus faecalis NOT DETECTED NOT DETECTED   Enterococcus Faecium NOT DETECTED NOT DETECTED   Listeria monocytogenes NOT DETECTED NOT DETECTED   Staphylococcus species NOT DETECTED NOT DETECTED   Staphylococcus aureus (BCID) NOT DETECTED NOT DETECTED   Staphylococcus epidermidis NOT DETECTED NOT DETECTED   Staphylococcus lugdunensis NOT DETECTED NOT DETECTED   Streptococcus species NOT DETECTED NOT DETECTED   Streptococcus agalactiae NOT DETECTED NOT DETECTED   Streptococcus pneumoniae NOT DETECTED NOT DETECTED   Streptococcus pyogenes NOT DETECTED NOT DETECTED   A.calcoaceticus-baumannii NOT DETECTED NOT DETECTED   Bacteroides fragilis NOT DETECTED NOT DETECTED   Enterobacterales NOT DETECTED NOT DETECTED   Enterobacter cloacae complex NOT DETECTED NOT DETECTED   Escherichia coli NOT DETECTED NOT DETECTED   Klebsiella aerogenes NOT DETECTED NOT DETECTED   Klebsiella oxytoca NOT DETECTED NOT DETECTED   Klebsiella  pneumoniae NOT DETECTED NOT DETECTED   Proteus species NOT DETECTED NOT DETECTED   Salmonella species NOT DETECTED NOT DETECTED   Serratia marcescens NOT DETECTED NOT DETECTED   Haemophilus influenzae NOT DETECTED NOT DETECTED   Neisseria meningitidis NOT DETECTED NOT DETECTED   Pseudomonas aeruginosa NOT DETECTED NOT DETECTED   Stenotrophomonas maltophilia NOT DETECTED NOT DETECTED   Candida albicans NOT DETECTED NOT DETECTED   Candida auris NOT DETECTED NOT DETECTED   Candida glabrata NOT DETECTED NOT DETECTED   Candida krusei NOT DETECTED NOT DETECTED   Candida parapsilosis NOT DETECTED NOT DETECTED   Candida tropicalis NOT DETECTED NOT DETECTED   Cryptococcus neoformans/gattii NOT DETECTED NOT DETECTED    Thank you for allowing pharmacy to be a part of this patient's care.  Alycia Rossetti, PharmD, BCPS Infectious Diseases Clinical Pharmacist 12/01/2021 3:57 PM   **Pharmacist phone directory can now be found on [email or web address] ([email or web address]).  Listed under [place].

## 2021-12-02 DIAGNOSIS — J441 Chronic obstructive pulmonary disease with (acute) exacerbation: Secondary | ICD-10-CM | POA: Diagnosis not present

## 2021-12-02 DIAGNOSIS — I10 Essential (primary) hypertension: Secondary | ICD-10-CM | POA: Diagnosis not present

## 2021-12-02 DIAGNOSIS — J189 Pneumonia, unspecified organism: Secondary | ICD-10-CM | POA: Diagnosis not present

## 2021-12-02 DIAGNOSIS — J9601 Acute respiratory failure with hypoxia: Secondary | ICD-10-CM | POA: Diagnosis not present

## 2021-12-02 LAB — LEGIONELLA PNEUMOPHILA SEROGP 1 UR AG: L. pneumophila Serogp 1 Ur Ag: NEGATIVE

## 2021-12-02 MED ORDER — AMLODIPINE BESYLATE 10 MG PO TABS
10.0000 mg | ORAL_TABLET | Freq: Every day | ORAL | Status: DC
  Administered 2021-12-03 – 2021-12-16 (×14): 10 mg via ORAL
  Filled 2021-12-02 (×14): qty 1

## 2021-12-02 MED ORDER — ARFORMOTEROL TARTRATE 15 MCG/2ML IN NEBU
15.0000 ug | INHALATION_SOLUTION | Freq: Two times a day (BID) | RESPIRATORY_TRACT | Status: DC
  Administered 2021-12-02 – 2021-12-16 (×28): 15 ug via RESPIRATORY_TRACT
  Filled 2021-12-02 (×29): qty 2

## 2021-12-02 MED ORDER — IPRATROPIUM-ALBUTEROL 0.5-2.5 (3) MG/3ML IN SOLN: 3.0000 mL | Freq: Four times a day (QID) | RESPIRATORY_TRACT | Status: DC

## 2021-12-02 MED ORDER — BUDESONIDE 0.25 MG/2ML IN SUSP
0.2500 mg | Freq: Two times a day (BID) | RESPIRATORY_TRACT | Status: DC
  Administered 2021-12-02 – 2021-12-16 (×28): 0.25 mg via RESPIRATORY_TRACT
  Filled 2021-12-02 (×30): qty 2

## 2021-12-02 NOTE — Progress Notes (Signed)
[facility]                                                                              Kyle Lewis, is a 76 y.o. male, DOB - May 10, 1946, EYC:[id number] Admit date - 11/30/2021    Outpatient Primary MD for the patient is [name], MD  LOS - 2  days  Chief Complaint  Patient presents with   Back Pain       Brief summary   Patient is a 76 year old male with hypertension, obesity, COPD, OSA, CAD, AAA repair, TAVR, hyperlipidemia, tobacco use, chronic back pain presented to ED with worsening back pain despite use of Ultram and prednisone given to the patient on 6/23.  Patient also reported cough with chills and wheezing.  Patient was hypoxic in ED to 83% on room air.  Patient was placed on 4 L O2 via [place] and sats improved to 94%.  Patient reported symptoms for 2 weeks and worse in the last 1 week. Chest x-ray showed mild bibasilar atelectasis and/or pneumonia.  In ED, patient was noted to have increasing O2 needs and desatted to 70%.  He was transitioned to NRB.  Assessment & Plan    Principal Problem:   Acute respiratory failure with hypoxia (HCC) likely due to community-acquired pneumonia and COPD exacerbation -On 3 L O2 via [place] on exam, diffusely wheezing -Continue IV Unasyn, added Brovana, Pulmicort, scheduled DuoNebs every 6 hours -Continue IV Solu-Medrol, flutter valve  Active Problems:   CAP (community acquired pneumonia) -As #1, wean O2 as tolerated -Blood cultures negative so far, urine strep antigen negative    Accelerated hypertension -BP still not well controlled -Continue metoprolol 25 mg twice daily, increase to Norvasc to 10 mg daily  -2D echo in 07/2021 had shown EF of 60 to 65%, G1 DD    COPD with acute exacerbation ([place]) -Noted to be wheezing on exam, started on IV Solu-Medrol, scheduled DuoNebs, continue Breo -Flutter valve    OSA (obstructive sleep apnea) -Continue CPAP    Tobacco use -Counseled smoking cessation     Hyperlipidemia LDL goal <70 -Continue statin  AAA repair, S/P TAVR (transcatheter aortic valve replacement) -CTA noted no acute dissection or acute aortic process.  A tiny endoleak in the anterior endosac would be difficult to exclude, smaller than on previous imaging, potentially representing calcification, recommended follow-up -Explained above to the patient and will follow-up with cardiology outpatient.  Obesity Estimated body mass index is 32.95 kg/m as calculated from the following:   Height as of this encounter: '5\' 8"'$  (1.727 m).   Weight as of this encounter: 98.3 kg.  Code Status: Full code DVT Prophylaxis:  heparin injection 5,000 Units Start: 11/30/21 1630   Level of Care: Level of care: Progressive Family Communication: Updated patient   Disposition Plan:      Remains inpatient appropriate: Hopefully DC in next 24 to 48 hours if continues to improve   Procedures:  None  Consultants:   None  Antimicrobials:   Anti-infectives (From admission, onward)    Start     Dose/Rate Route Frequency Ordered Stop   12/02/21 1800  vancomycin (VANCOREADY)  IVPB 1250 mg/250 mL        1,250 mg 166.7 mL/hr over 90 Minutes Intravenous Every 24 hours 12/01/21 1610     12/01/21 1800  azithromycin (ZITHROMAX) tablet 500 mg        500 mg Oral Every evening 12/01/21 0832 12/05/21 1759   12/01/21 1700  vancomycin (VANCOREADY) IVPB 1750 mg/350 mL        1,750 mg 175 mL/hr over 120 Minutes Intravenous  Once 12/01/21 1610 12/01/21 2045   11/30/21 1630  Ampicillin-Sulbactam (UNASYN) 3 g in sodium chloride 0.9 % 100 mL IVPB        3 g 200 mL/hr over 30 Minutes Intravenous Every 6 hours 11/30/21 1621     11/30/21 1630  azithromycin (ZITHROMAX) 500 mg in sodium chloride 0.9 % 250 mL IVPB  Status:  Discontinued        500 mg 250 mL/hr over 60 Minutes Intravenous Every 24 hours 11/30/21 1622 12/01/21 0832   11/30/21 0930  Ampicillin-Sulbactam (UNASYN) 3 g in sodium chloride 0.9 % 100 mL IVPB         3 g 200 mL/hr over 30 Minutes Intravenous  Once 11/30/21 0923 11/30/21 1120          Medications  amLODipine  5 mg Oral Daily   atorvastatin  40 mg Oral Daily   azithromycin  500 mg Oral QPM   finasteride  5 mg Oral Daily   fluticasone furoate-vilanterol  1 puff Inhalation Daily   gabapentin  300 mg Oral BID   guaiFENesin  400 mg Oral BID   heparin  5,000 Units Subcutaneous Q8H   methylPREDNISolone (SOLU-MEDROL) injection  80 mg Intravenous Q24H   metoprolol tartrate  25 mg Oral BID   sertraline  50 mg Oral Daily   tamsulosin  0.4 mg Oral QHS      Subjective:   Kyle Lewis was seen and examined today.  Still wheezing bilaterally, BP better today.  No fevers or chills, no coughing.    Objective:   Vitals:   12/02/21 0100 12/02/21 0556 12/02/21 0801 12/02/21 1418  BP: (!) 169/79 (!) 156/82  (!) 152/97  Pulse: 69 69 72 62  Resp: (!) 22 17 (!) 21 16  Temp:  98.1 F (36.7 C)  98.6 F (37 C)  TempSrc:  Oral  Oral  SpO2: 96%  92% (!) 89%  Weight:  98.3 kg    Height:        Intake/Output Summary (Last 24 hours) at 12/02/2021 1457 Last data filed at 12/02/2021 1420 Gross per 24 hour  Intake 1370.75 ml  Output 1650 ml  Net -279.25 ml     Wt Readings from Last 3 Encounters:  12/02/21 98.3 kg  11/25/21 97.8 kg  11/09/21 97.8 kg   Physical Exam General: Alert and oriented x 3, NAD Cardiovascular: S1 S2 clear, RRR. No pedal edema b/l Respiratory: Bilateral expiratory wheezing Gastrointestinal: Soft, nontender, nondistended, NBS Ext: no pedal edema bilaterally Neuro: no new deficits Skin: No rashes Psych: Normal affect and demeanor, alert and oriented x3       Data Reviewed:  I have personally reviewed following labs    CBC Lab Results  Component Value Date   WBC 12.2 (H) 12/01/2021   RBC 3.85 (L) 12/01/2021   HGB 12.4 (L) 12/01/2021   HCT 36.7 (L) 12/01/2021   MCV 95.3 12/01/2021   MCH 32.2 12/01/2021   PLT 172 12/01/2021   MCHC 33.8  12/01/2021   RDW 14.4  12/01/2021   LYMPHSABS 0.8 11/30/2021   MONOABS 0.9 11/30/2021   EOSABS 0.0 11/30/2021   BASOSABS 0.0 11/94/1740     Last metabolic panel Lab Results  Component Value Date   NA 142 12/01/2021   K 4.7 12/01/2021   CL 101 12/01/2021   CO2 33 (H) 12/01/2021   BUN 29 (H) 12/01/2021   CREATININE 0.93 12/01/2021   GLUCOSE 167 (H) 12/01/2021   GFRNONAA >60 12/01/2021   GFRAA 107 04/08/2020   CALCIUM 8.4 (L) 12/01/2021   PROT 5.8 (L) 12/01/2021   ALBUMIN 2.4 (L) 12/01/2021   LABGLOB 2.7 09/25/2019   AGRATIO 1.4 09/25/2019   BILITOT 0.8 12/01/2021   ALKPHOS 68 12/01/2021   AST 15 12/01/2021   ALT 18 12/01/2021   ANIONGAP 8 12/01/2021    CBG (last 3)  Recent Labs    12/01/21 0523  GLUCAP 171*        Danica Camarena M.D. [facility] 12/02/2021, 2:57 PM  Available via Epic secure chat 7am-7pm After 7 pm, please refer to night coverage provider listed on amion.

## 2021-12-02 NOTE — Progress Notes (Signed)
Pt placed on CPAP for night rest. 

## 2021-12-02 NOTE — Care Management Important Message (Signed)
Important Message  Patient Details  Name: AUDON HEYMANN MRN: [id number] Date of Birth: 04-04-1946   Medicare Important Message Given:  Yes     [name] 12/02/2021, 9:33 AM

## 2021-12-03 ENCOUNTER — Encounter (HOSPITAL_COMMUNITY): Payer: Self-pay | Admitting: Internal Medicine

## 2021-12-03 DIAGNOSIS — J441 Chronic obstructive pulmonary disease with (acute) exacerbation: Secondary | ICD-10-CM | POA: Diagnosis not present

## 2021-12-03 DIAGNOSIS — J9601 Acute respiratory failure with hypoxia: Secondary | ICD-10-CM | POA: Diagnosis not present

## 2021-12-03 DIAGNOSIS — J189 Pneumonia, unspecified organism: Secondary | ICD-10-CM | POA: Diagnosis not present

## 2021-12-03 DIAGNOSIS — I10 Essential (primary) hypertension: Secondary | ICD-10-CM | POA: Diagnosis not present

## 2021-12-03 LAB — GLUCOSE, CAPILLARY: Glucose-Capillary: 101 mg/dL — ABNORMAL HIGH (ref 70–99)

## 2021-12-03 LAB — EXPECTORATED SPUTUM ASSESSMENT W GRAM STAIN, RFLX TO RESP C

## 2021-12-03 MED ORDER — KETOROLAC TROMETHAMINE 15 MG/ML IJ SOLN
15.0000 mg | Freq: Once | INTRAMUSCULAR | Status: AC
  Administered 2021-12-03: 15 mg via INTRAVENOUS
  Filled 2021-12-03: qty 1

## 2021-12-03 MED ORDER — TRAMADOL HCL 50 MG PO TABS
100.0000 mg | ORAL_TABLET | Freq: Three times a day (TID) | ORAL | Status: DC | PRN
  Administered 2021-12-03 – 2021-12-08 (×11): 100 mg via ORAL
  Filled 2021-12-03 (×12): qty 2

## 2021-12-03 MED ORDER — PREDNISONE 20 MG PO TABS
40.0000 mg | ORAL_TABLET | Freq: Every day | ORAL | Status: AC
  Administered 2021-12-04 – 2021-12-06 (×3): 40 mg via ORAL
  Filled 2021-12-03 (×3): qty 2

## 2021-12-03 NOTE — Progress Notes (Signed)
HOSPITAL MEDICINE OVERNIGHT EVENT NOTE    Patient still complaining of continued exacerbation of his back pain despite taking usual home regimen of as needed tramadol.  We will give a one-time dose of IV Toradol.  Vernelle Emerald  MD [facility]

## 2021-12-03 NOTE — Evaluation (Signed)
Physical Therapy Evaluation Patient Details Name: Kyle Lewis MRN: [id number] DOB: 1945-11-15 Today's Date: 12/03/2021  History of Present Illness  76 y.o. male presents to [facility] hospital on 11/30/2021 with increase in back pain as well as cough and chills. Pt found to have CAP with associated respiratory failure. PMH includes HTN, COPD, OSA, CAD, AAA, chronic back pain.  Clinical Impression  Pt's PT impairments include generalized weakness, balance deficits, and decreased activity tolerance. Pt tolerates therapy well today, ambulating limited household distances with a rollator and 3-4L O2. At baseline, Pt uses SPC for ambulation and does not use supplemental oxygen. Pt begins mobilization with attempt to use SPC, but requires use of both hands to maintain balance. Pt also has difficulty managing the brakes of the rollator and keeping his body within the frame, despite frequent verbal cues. PT recommends Pt use RW for safer ambulation upon discharge. Continued therapy will assist the Pt in building strength and activity tolerance so that he can progress towards his baseline independence level.     Recommendations for follow up therapy are one component of a multi-disciplinary discharge planning process, led by the attending physician.  Recommendations may be updated based on patient status, additional functional criteria and insurance authorization.  Follow Up Recommendations Home health PT      Assistance Recommended at Discharge Set up Supervision/Assistance  Patient can return home with the following  A little help with walking and/or transfers;Assistance with cooking/housework;Help with stairs or ramp for entrance    Equipment Recommendations None recommended by PT  Recommendations for Other Services       Functional Status Assessment Patient has had a recent decline in their functional status and demonstrates the ability to make significant improvements in function in a reasonable and  predictable amount of time.     Precautions / Restrictions Precautions Precautions: Fall Restrictions Weight Bearing Restrictions: No      Mobility  Bed Mobility                    Transfers Overall transfer level: Needs assistance Equipment used: Rollator (4 wheels), Straight cane Transfers: Sit to/from Stand                  Ambulation/Gait Ambulation/Gait assistance: Min guard Gait Distance (Feet): 50 Feet (Additional trial of 50' after seated rest break) Assistive device: Rollator (4 wheels)   Gait velocity: decreased Gait velocity interpretation: <1.8 ft/sec, indicate of risk for recurrent falls   General Gait Details: Pt has difficulty following verbal cues to keep body within frame of rollator; Pt also manages rollator brakes poorly and requires frequent verbal cues  Stairs            Wheelchair Mobility    Modified Rankin (Stroke Patients Only)       Balance Overall balance assessment: Needs assistance Sitting-balance support: No upper extremity supported, Feet supported Sitting balance-Leahy Scale: Fair     Standing balance support: Bilateral upper extremity supported, Reliant on assistive device for balance Standing balance-Leahy Scale: Poor                               Pertinent Vitals/Pain Pain Assessment Pain Assessment: 0-10 Pain Score: 8  Pain Location: Lumbar spine / pelvic girdle Pain Descriptors / Indicators: Sharp, Tingling, Burning Pain Intervention(s): Monitored during session    Home Living Family/patient expects to be discharged to:: Private residence Living Arrangements: Spouse/significant other Available Help  at Discharge: Family;Available 24 hours/day Type of Home: House Home Access: Ramped entrance       Home Layout: One level Home Equipment: Cane - single Barista (2 wheels);Rollator (4 wheels);Grab bars - tub/shower;Shower seat (Pt reports that he uses SPC at baseline, PT  recommends he uses RW upon discharge due to difficulty controlling rollator during session)      Prior Function Prior Level of Function : Independent/Modified Independent;Needs assist;Driving       Physical Assist : ADLs (physical)     Mobility Comments: Pt reports that he uses SPC to ambulate limited household distances at baseline       Hand Dominance        Extremity/Trunk Assessment   Upper Extremity Assessment Upper Extremity Assessment: Generalized weakness    Lower Extremity Assessment Lower Extremity Assessment: Generalized weakness    Cervical / Trunk Assessment Cervical / Trunk Assessment: Kyphotic  Communication   Communication: No difficulties  Cognition Arousal/Alertness: Awake/alert Behavior During Therapy: WFL for tasks assessed/performed Overall Cognitive Status: Within Functional Limits for tasks assessed                                          General Comments General comments (skin integrity, edema, etc.): Pt received on 3L O2 and ambulates with 3-4L O2; temporary desaturation below 88%, Pt recovers into 90s with seated rest break and increase to 4L O2    Exercises     Assessment/Plan    PT Assessment Patient needs continued PT services  PT Problem List Decreased strength;Decreased activity tolerance;Decreased balance;Decreased mobility;Decreased knowledge of use of DME       PT Treatment Interventions DME instruction;Gait training;Stair training;Functional mobility training;Therapeutic activities;Therapeutic exercise;Balance training;Patient/family education    PT Goals (Current goals can be found in the Care Plan section)  Acute Rehab PT Goals Patient Stated Goal: Return home PT Goal Formulation: With patient Time For Goal Achievement: 12/17/21 Potential to Achieve Goals: Good    Frequency Min 3X/week     Co-evaluation               AM-PAC PT "6 Clicks" Mobility  Outcome Measure Help needed turning from  your back to your side while in a flat bed without using bedrails?: A Little Help needed moving from lying on your back to sitting on the side of a flat bed without using bedrails?: A Little Help needed moving to and from a bed to a chair (including a wheelchair)?: A Little Help needed standing up from a chair using your arms (e.g., wheelchair or bedside chair)?: A Little Help needed to walk in hospital room?: A Little Help needed climbing 3-5 steps with a railing? : Total 6 Click Score: 16    End of Session Equipment Utilized During Treatment: Gait belt;Oxygen Activity Tolerance: Patient limited by fatigue Patient left: in chair;with call bell/phone within reach;with chair alarm set Nurse Communication: Mobility status PT Visit Diagnosis: Other abnormalities of gait and mobility (R26.89);Muscle weakness (generalized) (M62.81)    Time: 1607-3710 PT Time Calculation (min) (ACUTE ONLY): 44 min   Charges:   PT Evaluation $PT Eval Low Complexity: 1 Low          Hall Busing, SPT Acute Rehabilitation Office #: [phone]   Hall Busing 12/03/2021, 1:32 PM

## 2021-12-03 NOTE — Progress Notes (Signed)
[facility]                                                                              Kyle Lewis, is a 76 y.o. male, DOB - 1945-06-13, TIR:[id number] Admit date - 11/30/2021    Outpatient Primary MD for the patient is [name], MD  LOS - 3  days  Chief Complaint  Patient presents with   Back Pain       Brief summary   Patient is a 76 year old male with hypertension, obesity, COPD, OSA, CAD, AAA repair, TAVR, hyperlipidemia, tobacco use, chronic back pain presented to ED with worsening back pain despite use of Ultram and prednisone given to the patient on 6/23.  Patient also reported cough with chills and wheezing.  Patient was hypoxic in ED to 83% on room air.  Patient was placed on 4 L O2 via [place] and sats improved to 94%.  Patient reported symptoms for 2 weeks and worse in the last 1 week. Chest x-ray showed mild bibasilar atelectasis and/or pneumonia.  In ED, patient was noted to have increasing O2 needs and desatted to 70%.  He was transitioned to NRB.  Assessment & Plan    Principal Problem:   Acute respiratory failure with hypoxia (HCC) likely due to community-acquired pneumonia and COPD exacerbation -On 3 L O2 via [place] on exam, wheezing much improved -Continue Brovana, Pulmicort, scheduled DuoNebs every 6 hours -Transition to oral prednisone -On vancomycin, Unasyn  Active Problems:   CAP (community acquired pneumonia)   GPC bacteremia -As #1, wean O2 as tolerated -Blood cultures showing GPC in clusters, awaiting sensitivities, continue IV Unasyn and vancomycin    Accelerated hypertension -BP improving, continue metoprolol, Norvasc -2D echo in 07/2021 had shown EF of 60 to 65%, G1 DD    COPD with acute exacerbation ([place]) -Improving, continue scheduled DuoNebs, Pulmicort, Brovana,  -Transition to oral prednisone     OSA (obstructive sleep apnea) -Continue CPAP    Tobacco use -Counseled smoking cessation    Hyperlipidemia LDL goal  <70 -Continue statin  AAA repair, S/P TAVR (transcatheter aortic valve replacement) -CTA noted no acute dissection or acute aortic process.  A tiny endoleak in the anterior endosac would be difficult to exclude, smaller than on previous imaging, potentially representing calcification, recommended follow-up -Explained above to the patient and will follow-up with cardiology outpatient.  Obesity Estimated body mass index is 34.26 kg/m as calculated from the following:   Height as of this encounter: '5\' 8"'$  (1.727 m).   Weight as of this encounter: 102.2 kg.  Code Status: Full code DVT Prophylaxis:  heparin injection 5,000 Units Start: 11/30/21 1630   Level of Care: Level of care: Progressive Family Communication: Updated patient's wife on the phone yesterday.  She requested PT evaluation and likely short-term rehab as patient is deconditioned and has not been very ambulatory   Disposition Plan:      Remains inpatient appropriate: PT OT evaluation, home O2 evaluation Procedures:  None  Consultants:   None  Antimicrobials:   Anti-infectives (From admission, onward)    Start     Dose/Rate Route Frequency Ordered Stop  12/02/21 1800  vancomycin (VANCOREADY) IVPB 1250 mg/250 mL        1,250 mg 166.7 mL/hr over 90 Minutes Intravenous Every 24 hours 12/01/21 1610     12/01/21 1800  azithromycin (ZITHROMAX) tablet 500 mg        500 mg Oral Every evening 12/01/21 0832 12/05/21 1759   12/01/21 1700  vancomycin (VANCOREADY) IVPB 1750 mg/350 mL        1,750 mg 175 mL/hr over 120 Minutes Intravenous  Once 12/01/21 1610 12/01/21 2045   11/30/21 1630  Ampicillin-Sulbactam (UNASYN) 3 g in sodium chloride 0.9 % 100 mL IVPB        3 g 200 mL/hr over 30 Minutes Intravenous Every 6 hours 11/30/21 1621     11/30/21 1630  azithromycin (ZITHROMAX) 500 mg in sodium chloride 0.9 % 250 mL IVPB  Status:  Discontinued        500 mg 250 mL/hr over 60 Minutes Intravenous Every 24 hours 11/30/21 1622  12/01/21 0832   11/30/21 0930  Ampicillin-Sulbactam (UNASYN) 3 g in sodium chloride 0.9 % 100 mL IVPB        3 g 200 mL/hr over 30 Minutes Intravenous  Once 11/30/21 0923 11/30/21 1120          Medications  amLODipine  10 mg Oral Daily   arformoterol  15 mcg Nebulization BID   atorvastatin  40 mg Oral Daily   azithromycin  500 mg Oral QPM   budesonide (PULMICORT) nebulizer solution  0.25 mg Nebulization BID   finasteride  5 mg Oral Daily   gabapentin  300 mg Oral BID   guaiFENesin  400 mg Oral BID   heparin  5,000 Units Subcutaneous Q8H   methylPREDNISolone (SOLU-MEDROL) injection  80 mg Intravenous Q24H   metoprolol tartrate  25 mg Oral BID   sertraline  50 mg Oral Daily   tamsulosin  0.4 mg Oral QHS      Subjective:   Kyle Lewis was seen and examined today.  Wheezing is improving, no acute complaints.  No acute chest pain or shortness of breath, nausea or vomiting.  No fevers.   Objective:   Vitals:   12/02/21 2237 12/02/21 2300 12/03/21 0324 12/03/21 0834  BP: (!) 186/86  (!) 175/89 (!) 132/59  Pulse:  65 61 60  Resp:  '20 17 15  '$ Temp:   97.9 F (36.6 C) 97.8 F (36.6 C)  TempSrc:   Oral Oral  SpO2:  96% 94% 92%  Weight:   102.2 kg   Height:        Intake/Output Summary (Last 24 hours) at 12/03/2021 1238 Last data filed at 12/03/2021 1047 Gross per 24 hour  Intake 1640.94 ml  Output 2200 ml  Net -559.06 ml     Wt Readings from Last 3 Encounters:  12/03/21 102.2 kg  11/25/21 97.8 kg  11/09/21 97.8 kg   Physical Exam General: Sleepy but easily arousable and oriented, NAD Cardiovascular: S1 S2 clear, RRR. Respiratory: CTAB, no wheezing Gastrointestinal: Soft, nontender, nondistended, NBS Ext: no pedal edema bilaterally Neuro: no new deficits Psych: Sleepy but easily arousable       Data Reviewed:  I have personally reviewed following labs    CBC Lab Results  Component Value Date   WBC 12.2 (H) 12/01/2021   RBC 3.85 (L) 12/01/2021    HGB 12.4 (L) 12/01/2021   HCT 36.7 (L) 12/01/2021   MCV 95.3 12/01/2021   MCH 32.2 12/01/2021   PLT 172 12/01/2021  MCHC 33.8 12/01/2021   RDW 14.4 12/01/2021   LYMPHSABS 0.8 11/30/2021   MONOABS 0.9 11/30/2021   EOSABS 0.0 11/30/2021   BASOSABS 0.0 91/79/1505     Last metabolic panel Lab Results  Component Value Date   NA 142 12/01/2021   K 4.7 12/01/2021   CL 101 12/01/2021   CO2 33 (H) 12/01/2021   BUN 29 (H) 12/01/2021   CREATININE 0.93 12/01/2021   GLUCOSE 167 (H) 12/01/2021   GFRNONAA >60 12/01/2021   GFRAA 107 04/08/2020   CALCIUM 8.4 (L) 12/01/2021   PROT 5.8 (L) 12/01/2021   ALBUMIN 2.4 (L) 12/01/2021   LABGLOB 2.7 09/25/2019   AGRATIO 1.4 09/25/2019   BILITOT 0.8 12/01/2021   ALKPHOS 68 12/01/2021   AST 15 12/01/2021   ALT 18 12/01/2021   ANIONGAP 8 12/01/2021    CBG (last 3)  Recent Labs    12/01/21 0523 12/03/21 0824  GLUCAP 171* 101*        Rishab Stoudt M.D. [facility] 12/03/2021, 12:38 PM  Available via Epic secure chat 7am-7pm After 7 pm, please refer to night coverage provider listed on amion.

## 2021-12-03 NOTE — Evaluation (Signed)
Occupational Therapy Evaluation Patient Details Name: Kyle Lewis MRN: [id number] DOB: [birth year] Today's Date: 12/03/2021   History of Present Illness 76 y.o. male presents to [facility] hospital on 11/30/2021 with increase in back pain as well as cough and chills. Pt found to have CAP with associated respiratory failure. PMH includes HTN, COPD, OSA, CAD, AAA, chronic back pain.   Clinical Impression   76 yo male admitted for above detailed medical problems. He reports he was ambulating with cane at home and using AE for LB ADLs. His wife provides assistance as he needs it and provides medication management. He was asleep on arrival and desatting to 80%. Note nasal cannula had slipped out of his nose while he was sleeping and upin replacing and awakening pt he was able to increase to upper 80s. Mobility limited by SpO2 sustained below goal even with use of flutter valve. Notified RN and RN increasing O2. Will continue to follow acutely for OT.      Recommendations for follow up therapy are one component of a multi-disciplinary discharge planning process, led by the attending physician.  Recommendations may be updated based on patient status, additional functional criteria and insurance authorization.   Follow Up Recommendations  Home health OT    Assistance Recommended at Discharge Frequent or constant Supervision/Assistance  Patient can return home with the following A little help with walking and/or transfers;A little help with bathing/dressing/bathroom;Direct supervision/assist for medications management    Functional Status Assessment  Patient has had a recent decline in their functional status and/or demonstrates limited ability to make significant improvements in function in a reasonable and predictable amount of time  Equipment Recommendations  None recommended by OT       Precautions / Restrictions Precautions Precautions: Fall Restrictions Weight Bearing Restrictions: No       Mobility Bed Mobility Overal bed mobility: Needs Assistance Bed Mobility: Rolling Rolling: Min assist             ADL either performed or assessed with clinical judgement   ADL Overall ADL's : Needs assistance/impaired Eating/Feeding: Set up;Bed level   Grooming: Bed level;Set up   Upper Body Bathing: Minimal assistance   Lower Body Bathing: Moderate assistance   Upper Body Dressing : Min guard   Lower Body Dressing: Moderate assistance   Toilet Transfer: Moderate assistance;BSC/3in1   Toileting- Clothing Manipulation and Hygiene: Minimal assistance;Sit to/from stand               Vision Baseline Vision/History: 0 No visual deficits Ability to See in Adequate Light: 0 Adequate Patient Visual Report: No change from baseline Vision Assessment?: No apparent visual deficits            Pertinent Vitals/Pain Pain Assessment Pain Assessment: 0-10 Pain Score: 4  Pain Location: Lumbar spine / pelvic girdle Pain Descriptors / Indicators: Sharp, Tingling, Burning Pain Intervention(s): Monitored during session, Repositioned     Hand Dominance Right   Extremity/Trunk Assessment Upper Extremity Assessment Upper Extremity Assessment: Generalized weakness (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), difficulty acheiving full shoulder range but otherwise no difficulty with movement)   Lower Extremity Assessment Lower Extremity Assessment: Generalized weakness   Cervical / Trunk Assessment Cervical / Trunk Assessment: Kyphotic   Communication Communication Communication: No difficulties   Cognition Arousal/Alertness: Awake/alert Behavior During Therapy: WFL for tasks assessed/performed Overall Cognitive Status: Within Functional Limits for tasks assessed           General Comments: received in sleep state with nasal  cannula slid out of nostrils, saturations 81%, once repositioned and pt fully alerted pt increased to 90% but unable to sustain,  frequent fluctuations with SpO2 dropping down to 87% then increasing to 90%, pt applying CPAP (uses at home) given he was trying to take a nap but sats did not improve, returned to nasal cannula and notified RN who increased O2 to 4L     General Comments  did not mobilize due to SpO2 below goal on arrival and difficulty with increased time            Home Living Family/patient expects to be discharged to:: Private residence Living Arrangements: Spouse/significant other Available Help at Discharge: Family;Available 24 hours/day Type of Home: House Home Access: Ramped entrance     Home Layout: One level     Bathroom Shower/Tub: Hospital doctor Toilet: Handicapped height     Home Equipment: [place] - single Barista (2 wheels);Rollator (4 wheels);Grab bars - tub/shower;Shower seat          Prior Functioning/Environment Prior Level of Function : Independent/Modified Independent;Needs assist;Driving       Physical Assist : ADLs (physical)     Mobility Comments: reports use of SPC for mobility, occasional use of rollator,  uses AE for LB dressing          OT Problem List: Decreased strength;Pain;Impaired balance (sitting and/or standing);Decreased activity tolerance;Decreased knowledge of use of DME or AE      OT Treatment/Interventions: Self-care/ADL training;Patient/family education;Balance training;Energy conservation;DME and/or AE instruction    OT Goals(Current goals can be found in the care plan section) Acute Rehab OT Goals Patient Stated Goal: improve mobility and minimize pain OT Goal Formulation: With patient Time For Goal Achievement: 12/17/21 Potential to Achieve Goals: Fair ADL Goals Pt Will Perform Lower Body Bathing: with modified independence;with adaptive equipment Pt Will Perform Lower Body Dressing: with modified independence;with adaptive equipment;sit to/from stand Pt Will Transfer to Toilet: with min guard  assist;ambulating;regular height toilet Pt Will Perform Toileting - Clothing Manipulation and hygiene: with supervision;sit to/from stand  OT Frequency: Min 2X/week       AM-PAC OT "6 Clicks" Daily Activity     Outcome Measure Help from another person eating meals?: A Little Help from another person taking care of personal grooming?: A Little Help from another person toileting, which includes using toliet, bedpan, or urinal?: A Little Help from another person bathing (including washing, rinsing, drying)?: A Lot Help from another person to put on and taking off regular upper body clothing?: A Little Help from another person to put on and taking off regular lower body clothing?: A Lot 6 Click Score: 16   End of Session Equipment Utilized During Treatment: Oxygen Nurse Communication: Other (comment) (SpO2)  Activity Tolerance: No increased pain;Other (comment) (limited by desats) Patient left: in bed;with nursing/sitter in room  OT Visit Diagnosis: Unsteadiness on feet (R26.81);Other abnormalities of gait and mobility (R26.89);Muscle weakness (generalized) (M62.81);Pain Pain - Right/Left: Left Pain - part of body: Shoulder (back)                Time: 1610-9604 OT Time Calculation (min): 33 min Charges:  OT General Charges $OT Visit: 1 Visit OT Evaluation $OT Eval Low Complexity: 1 Low OT Treatments $Self Care/Home Management : 8-22 mins    Naomie Dean Kenton Fortin, OTR/L 12/03/2021, 3:56 PM

## 2021-12-04 DIAGNOSIS — J9601 Acute respiratory failure with hypoxia: Secondary | ICD-10-CM | POA: Diagnosis not present

## 2021-12-04 DIAGNOSIS — E785 Hyperlipidemia, unspecified: Secondary | ICD-10-CM

## 2021-12-04 DIAGNOSIS — J189 Pneumonia, unspecified organism: Secondary | ICD-10-CM | POA: Diagnosis not present

## 2021-12-04 DIAGNOSIS — I10 Essential (primary) hypertension: Secondary | ICD-10-CM | POA: Diagnosis not present

## 2021-12-04 DIAGNOSIS — J441 Chronic obstructive pulmonary disease with (acute) exacerbation: Secondary | ICD-10-CM | POA: Diagnosis not present

## 2021-12-04 LAB — GLUCOSE, CAPILLARY: Glucose-Capillary: 136 mg/dL — ABNORMAL HIGH (ref 70–99)

## 2021-12-04 MED ORDER — NICOTINE 21 MG/24HR TD PT24
21.0000 mg | MEDICATED_PATCH | Freq: Every day | TRANSDERMAL | Status: DC
  Administered 2021-12-04 – 2021-12-16 (×13): 21 mg via TRANSDERMAL
  Filled 2021-12-04 (×13): qty 1

## 2021-12-04 NOTE — Progress Notes (Signed)
[facility]                                                                              Kyle Lewis, is a 76 y.o. male, DOB - [birth year], URK:[id number] Admit date - 11/30/2021    Outpatient Primary MD for the patient is [name], MD  LOS - 4  days  Chief Complaint  Patient presents with   Back Pain       Brief summary   Patient is a 76 year old male with hypertension, obesity, COPD, OSA, CAD, AAA repair, TAVR, hyperlipidemia, tobacco use, chronic back pain presented to ED with worsening back pain despite use of Ultram and prednisone given to the patient on 6/23.  Patient also reported cough with chills and wheezing.  Patient was hypoxic in ED to 83% on room air.  Patient was placed on 4 L O2 via [place] and sats improved to 94%.  Patient reported symptoms for 2 weeks and worse in the last 1 week. Chest x-ray showed mild bibasilar atelectasis and/or pneumonia.  In ED, patient was noted to have increasing O2 needs and desatted to 70%.  He was transitioned to NRB.  Assessment & Plan    Principal Problem:   Acute respiratory failure with hypoxia (HCC) likely due to community-acquired pneumonia and COPD exacerbation - wheezing improving, O2 sats improving weaned to 2L  - Continue Brovana, Pulmicort, scheduled DuoNebs  -Transition to oral prednisone -On vancomycin, Unasyn  Active Problems:   CAP (community acquired pneumonia)   GPC bacteremia -wean O2 as tolerated  -Blood cultures showing GPC in clusters, awaiting sensitivities, continue IV Unasyn    Accelerated hypertension -BP improved, continue metoprolol, Norvasc -2D echo in 07/2021 had shown EF of 60 to 65%, G1 DD    COPD with acute exacerbation ([place]) -Improving, continue scheduled DuoNebs, Pulmicort, Brovana,  -Transition to oral prednisone     OSA (obstructive sleep apnea) -Continue CPAP    Tobacco use -Counseled smoking cessation    Hyperlipidemia LDL goal <70 -Continue statin  AAA  repair, S/P TAVR (transcatheter aortic valve replacement) -CTA noted no acute dissection or acute aortic process.  A tiny endoleak in the anterior endosac would be difficult to exclude, smaller than on previous imaging, potentially representing calcification, recommended follow-up -Explained above to the patient and will follow-up with cardiology outpatient.  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   Weight as of this encounter: 99.1 kg.  Code Status: Full code DVT Prophylaxis:  heparin injection 5,000 Units Start: 11/30/21 1630   Level of Care: Level of care: Progressive Family Communication: Updated patient's wife on the phone today.  She is requesting short-term rehab as patient is deconditioned and has not been very ambulatory, multiple falls.I I had a long conversation with this patient's wife.  Patient was seen by OT yesterday and recommended home health OT.  Patient's wife is very concerned with that and feels that patient is not safe to come home  and she would like him to go to a rehab place.  He has been having multiple falls, very deconditioned and she herself has medical issues including leukemia and she is not able to provide the help for bathing transfers etc.     Disposition Plan:      Remains inpatient appropriate: PT evaluation, home O2 evaluation Procedures:  None  Consultants:   None  Antimicrobials:   Anti-infectives (From admission, onward)    Start     Dose/Rate Route Frequency Ordered Stop   12/02/21 1800  vancomycin (VANCOREADY) IVPB 1250 mg/250 mL        1,250 mg 166.7 mL/hr over 90 Minutes Intravenous Every 24 hours 12/01/21 1610     12/01/21 1800  azithromycin (ZITHROMAX) tablet 500 mg        500 mg Oral Every evening 12/01/21 0832 12/05/21 1759   12/01/21 1700  vancomycin (VANCOREADY)  IVPB 1750 mg/350 mL        1,750 mg 175 mL/hr over 120 Minutes Intravenous  Once 12/01/21 1610 12/01/21 2045   11/30/21 1630  Ampicillin-Sulbactam (UNASYN) 3 g in sodium chloride 0.9 % 100 mL IVPB        3 g 200 mL/hr over 30 Minutes Intravenous Every 6 hours 11/30/21 1621     11/30/21 1630  azithromycin (ZITHROMAX) 500 mg in sodium chloride 0.9 % 250 mL IVPB  Status:  Discontinued        500 mg 250 mL/hr over 60 Minutes Intravenous Every 24 hours 11/30/21 1622 12/01/21 0832   11/30/21 0930  Ampicillin-Sulbactam (UNASYN) 3 g in sodium chloride 0.9 % 100 mL IVPB        3 g 200 mL/hr over 30 Minutes Intravenous  Once 11/30/21 0923 11/30/21 1120          Medications  amLODipine  10 mg Oral Daily   arformoterol  15 mcg Nebulization BID   atorvastatin  40 mg Oral Daily   azithromycin  500 mg Oral QPM   budesonide (PULMICORT) nebulizer solution  0.25 mg Nebulization BID   finasteride  5 mg Oral Daily   gabapentin  300 mg Oral BID   guaiFENesin  400 mg Oral BID   heparin  5,000 Units Subcutaneous Q8H   metoprolol tartrate  25 mg Oral BID   predniSONE  40 mg Oral QAC breakfast   sertraline  50 mg Oral Daily   tamsulosin  0.4 mg Oral QHS      Subjective:   Kyle Lewis was seen and examined today.  Wheezing is improving.  No acute shortness of breath, wean O2.  No fevers.  No coughing   Objective:   Vitals:   12/04/21 0532 12/04/21 0908 12/04/21 0909 12/04/21 0933  BP:    (!) 158/78  Pulse:    (!) 55  Resp:    17  Temp:    97.9 F (36.6 C)  TempSrc:    Oral  SpO2:  90% 96% 95%  Weight: 99.1 kg     Height:        Intake/Output Summary (Last 24 hours) at 12/04/2021 1355 Last data filed at 12/04/2021 1206 Gross per 24 hour  Intake 1496.56 ml  Output 1350 ml  Net 146.56 ml     Wt Readings from Last 3 Encounters:  12/04/21 99.1 kg  11/25/21 97.8 kg  11/09/21 97.8 kg   Physical Exam General: Alert and oriented x 3, NAD Cardiovascular: S1 S2 clear, RRR. No pedal  edema b/l Respiratory: CTAB,  no wheezing, rales or rhonchi Gastrointestinal: Soft, nontender, nondistended, NBS Ext: no pedal edema bilaterally Neuro: no new deficits   Data Reviewed:  I have personally reviewed following labs    CBC Lab Results  Component Value Date   WBC 12.2 (H) 12/01/2021   RBC 3.85 (L) 12/01/2021   HGB 12.4 (L) 12/01/2021   HCT 36.7 (L) 12/01/2021   MCV 95.3 12/01/2021   MCH 32.2 12/01/2021   PLT 172 12/01/2021   MCHC 33.8 12/01/2021   RDW 14.4 12/01/2021   LYMPHSABS 0.8 11/30/2021   MONOABS 0.9 11/30/2021   EOSABS 0.0 11/30/2021   BASOSABS 0.0 20/23/3435     Last metabolic panel Lab Results  Component Value Date   NA 142 12/01/2021   K 4.7 12/01/2021   CL 101 12/01/2021   CO2 33 (H) 12/01/2021   BUN 29 (H) 12/01/2021   CREATININE 0.93 12/01/2021   GLUCOSE 167 (H) 12/01/2021   GFRNONAA >60 12/01/2021   GFRAA 107 04/08/2020   CALCIUM 8.4 (L) 12/01/2021   PROT 5.8 (L) 12/01/2021   ALBUMIN 2.4 (L) 12/01/2021   LABGLOB 2.7 09/25/2019   AGRATIO 1.4 09/25/2019   BILITOT 0.8 12/01/2021   ALKPHOS 68 12/01/2021   AST 15 12/01/2021   ALT 18 12/01/2021   ANIONGAP 8 12/01/2021    CBG (last 3)  Recent Labs    12/03/21 0824 12/04/21 0819  GLUCAP 101* 136*        Nikaya Nasby M.D. [facility] 12/04/2021, 1:55 PM  Available via Epic secure chat 7am-7pm After 7 pm, please refer to night coverage provider listed on amion.

## 2021-12-04 NOTE — Progress Notes (Signed)
Patient refusing to walk at this time. Explained importance of getting out of bed and movement. Patient continued to refuse.

## 2021-12-04 NOTE — Progress Notes (Signed)
SATURATION QUALIFICATIONS: (This note is used to comply with regulatory documentation for home oxygen)  Patient Saturations on Room Air at Rest = 84%  Patient Saturations on Room Air while Ambulating = Did not ambulate on room air due to desaturation at rest on room air  Patient Saturations on 2 Liters of oxygen while Ambulating = 90-92%  Please briefly explain why patient needs home oxygen: Pt desaturates when at rest and mobilizing on room air. Utilization of supplemental oxygen will aide in improving activity tolerance and ability to mobilize around the house.

## 2021-12-04 NOTE — Progress Notes (Signed)
Physical Therapy Treatment Patient Details Name: Kyle Lewis MRN: [id number] DOB: [birth year] Today's Date: 12/04/2021   History of Present Illness 76 y.o. male presents to [facility] hospital on 11/30/2021 with increase in back pain as well as cough and chills. Pt found to have CAP with associated respiratory failure. PMH includes HTN, COPD, OSA, CAD, AAA, chronic back pain.    PT Comments    Pt limited by pain during therapy today, as he ambulates shorter distances than previous session. Pt ambulates limited household distances with an AD and supplemental O2. PT updates Pt discharge recommendation to SNF and communicates this with Pt and his spouse. SNF is recommended because Pt's decreased activity tolerance and strength prevent him from safely navigating his home and completing chores/ADLs for himself. Continued therapy will facilitate the Pt in improving strength, balance, and activity tolerance before returning home.  Recommendations for follow up therapy are one component of a multi-disciplinary discharge planning process, led by the attending physician.  Recommendations may be updated based on patient status, additional functional criteria and insurance authorization.  Follow Up Recommendations  Skilled nursing-short term rehab (<3 hours/day) Can patient physically be transported by private vehicle: Yes   Assistance Recommended at Discharge Intermittent Supervision/Assistance  Patient can return home with the following A little help with walking and/or transfers;A little help with bathing/dressing/bathroom;Assistance with cooking/housework;Direct supervision/assist for medications management;Direct supervision/assist for financial management;Assist for transportation;Help with stairs or ramp for entrance   Equipment Recommendations  None recommended by PT    Recommendations for Other Services       Precautions / Restrictions Precautions Precautions: Fall Restrictions Weight Bearing  Restrictions: No     Mobility  Bed Mobility Overal bed mobility: Needs Assistance Bed Mobility: Supine to Sit, Sit to Supine     Supine to sit: HOB elevated, Supervision Sit to supine: HOB elevated, Supervision        Transfers Overall transfer level: Needs assistance Equipment used: Rolling walker (2 wheels) Transfers: Sit to/from Stand Sit to Stand: Min guard           General transfer comment: Pt receives verbal cues for hand placement and keeping RW closer to body during transfers    Ambulation/Gait Ambulation/Gait assistance: Min guard Gait Distance (Feet): 40 Feet (Initial trial of 10' limited by pain; Pt takes seated rest break prior to 40' trial) Assistive device: Rolling walker (2 wheels) Gait Pattern/deviations: Step-through pattern, Decreased step length - right, Decreased step length - left, Decreased stride length, Trunk flexed Gait velocity: decreased Gait velocity interpretation: <1.8 ft/sec, indicate of risk for recurrent falls   General Gait Details: Pt leans on walker to ease pain; PT gives verbal cues to stand up straight   Stairs             Wheelchair Mobility    Modified Rankin (Stroke Patients Only)       Balance Overall balance assessment: Needs assistance Sitting-balance support: No upper extremity supported, Feet supported Sitting balance-Leahy Scale: Fair     Standing balance support: Bilateral upper extremity supported, Reliant on assistive device for balance Standing balance-Leahy Scale: Poor                              Cognition Arousal/Alertness: Awake/alert Behavior During Therapy: WFL for tasks assessed/performed Overall Cognitive Status: Within Functional Limits for tasks assessed  Exercises      General Comments General comments (skin integrity, edema, etc.): PT completes pulse ox during ambulation assessment; Patient desats to 80s at rest  on room air (84-87%) and recovers to low 90s on 1L O2. Pt desats to mid 80s during ambulation on 1L O2, sats maintained 90-92% on 2L O2 during ambulation      Pertinent Vitals/Pain Pain Assessment Pain Assessment: 0-10 Pain Score: 10-Worst pain ever Pain Location: Lumbar spine / pelvic girdle Pain Descriptors / Indicators: Sharp Pain Intervention(s): Monitored during session, Limited activity within patient's tolerance, RN gave pain meds during session    Home Living     Available Help at Discharge:  (Wife reports she is unable to provide physical assist)                    Prior Function            PT Goals (current goals can now be found in the care plan section) Acute Rehab PT Goals Patient Stated Goal: Return home PT Goal Formulation: With patient Time For Goal Achievement: 12/17/21 Potential to Achieve Goals: Good Progress towards PT goals: Progressing toward goals    Frequency    Min 2X/week      PT Plan Discharge plan needs to be updated    Co-evaluation              AM-PAC PT "6 Clicks" Mobility   Outcome Measure  Help needed turning from your back to your side while in a flat bed without using bedrails?: A Little Help needed moving from lying on your back to sitting on the side of a flat bed without using bedrails?: A Little Help needed moving to and from a bed to a chair (including a wheelchair)?: A Little Help needed standing up from a chair using your arms (e.g., wheelchair or bedside chair)?: A Little Help needed to walk in hospital room?: A Little Help needed climbing 3-5 steps with a railing? : Total 6 Click Score: 16    End of Session Equipment Utilized During Treatment: Gait belt;Oxygen Activity Tolerance: Patient limited by pain (LBP) Patient left: in bed;with bed alarm set;with call bell/phone within reach Nurse Communication: Mobility status PT Visit Diagnosis: Other abnormalities of gait and mobility (R26.89);Muscle weakness  (generalized) (M62.81)     Time: 1435-1510 PT Time Calculation (min) (ACUTE ONLY): 35 min  Charges:  $Gait Training: 8-22 mins $Therapeutic Activity: 8-22 mins                     Hall Busing, SPT Acute Rehabilitation Office #: [phone]   Hall Busing 12/04/2021, 4:17 PM

## 2021-12-05 ENCOUNTER — Inpatient Hospital Stay (HOSPITAL_COMMUNITY): Payer: Medicare Other

## 2021-12-05 DIAGNOSIS — M545 Low back pain, unspecified: Secondary | ICD-10-CM

## 2021-12-05 DIAGNOSIS — B9689 Other specified bacterial agents as the cause of diseases classified elsewhere: Secondary | ICD-10-CM | POA: Diagnosis not present

## 2021-12-05 DIAGNOSIS — T826XXA Infection and inflammatory reaction due to cardiac valve prosthesis, initial encounter: Secondary | ICD-10-CM | POA: Diagnosis not present

## 2021-12-05 DIAGNOSIS — I10 Essential (primary) hypertension: Secondary | ICD-10-CM | POA: Diagnosis not present

## 2021-12-05 DIAGNOSIS — J9601 Acute respiratory failure with hypoxia: Secondary | ICD-10-CM | POA: Diagnosis not present

## 2021-12-05 DIAGNOSIS — J441 Chronic obstructive pulmonary disease with (acute) exacerbation: Secondary | ICD-10-CM | POA: Diagnosis not present

## 2021-12-05 DIAGNOSIS — R7881 Bacteremia: Secondary | ICD-10-CM | POA: Diagnosis not present

## 2021-12-05 DIAGNOSIS — J189 Pneumonia, unspecified organism: Secondary | ICD-10-CM | POA: Diagnosis not present

## 2021-12-05 LAB — BASIC METABOLIC PANEL
Anion gap: 5 (ref 5–15)
BUN: 21 mg/dL (ref 8–23)
CO2: 34 mmol/L — ABNORMAL HIGH (ref 22–32)
Calcium: 8.2 mg/dL — ABNORMAL LOW (ref 8.9–10.3)
Chloride: 97 mmol/L — ABNORMAL LOW (ref 98–111)
Creatinine, Ser: 0.76 mg/dL (ref 0.61–1.24)
GFR, Estimated: >60 mL/min (ref >60)
Glucose, Bld: 100 mg/dL — ABNORMAL HIGH (ref 70–99)
Potassium: 4.4 mmol/L (ref 3.5–5.1)
Sodium: 136 mmol/L (ref 135–145)

## 2021-12-05 LAB — CULTURE, RESPIRATORY W GRAM STAIN
Culture: NORMAL
Gram Stain: NONE SEEN

## 2021-12-05 MED ORDER — PENICILLIN G POTASSIUM 20000000 UNITS IJ SOLR
12.0000 10*6.[IU] | Freq: Two times a day (BID) | INTRAVENOUS | Status: DC
  Administered 2021-12-05 – 2021-12-16 (×21): 12 10*6.[IU] via INTRAVENOUS
  Filled 2021-12-05: qty 10
  Filled 2021-12-05 (×10): qty 12
  Filled 2021-12-05: qty 5
  Filled 2021-12-05 (×13): qty 12

## 2021-12-05 MED ORDER — GENTAMICIN SULFATE 40 MG/ML IJ SOLN
3.0000 mg/kg | INTRAMUSCULAR | Status: DC
  Administered 2021-12-05 – 2021-12-08 (×4): 240 mg via INTRAVENOUS
  Filled 2021-12-05 (×5): qty 6

## 2021-12-05 NOTE — Progress Notes (Signed)
Pt placed on CPAP w/ 3L O2. RT will cont to monitor as needed.

## 2021-12-05 NOTE — Progress Notes (Signed)
Mobility Specialist Progress Note:   12/05/21 1112  Mobility  Activity Ambulated with assistance in room  Level of Assistance Contact guard assist, steadying assist  Assistive Device Front wheel walker  Distance Ambulated (ft) 16 ft (8+8)  Activity Response Tolerated well  $Mobility charge 1 Mobility   Pt received in chair willing to participate in mobility. Complaints of hip pain. Pt able to ambulate 8 ft then needed to sit. Upon standing again pt stated he couldn't walk any further d/t hip pain so ambulated back to chair. Left in chair with call bell in reach and all needs met.   [facility] Anne-Marie Genson Mobility Specialist

## 2021-12-05 NOTE — Consult Note (Signed)
[facility]    Date of Admission:  11/30/2021     Reason for Consult:Bacteremia     Referring Physician: Dr Tana Coast  Current antibiotics: Unasyn Vancomycin   ASSESSMENT:    76 y.o. male admitted with:  Gemella sanguinis bacteremia: Patient with positive blood cultures in 4 of 4 bottles from 11/30/21 and micro lab is growing Gemella sanguinis.  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  Suspect an odontogenic source given poor dentition.  Repeat blood cultures have been ordered for today. History of AS s/p TAVR: Completed in January 2023. History of AAA: Status post endovascular repair in January 2019. COPD Exacerbation: Seems to be improving and down to 2 liters.  He reports that his symptoms are improved.  Chronic back pain: History of spinal fusion from L2-5 and initial complaint on admission was lumbar and thoracic pain that is not any better per patient.  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 Repeat blood cultures ordered Recommend a TEE and cardiology involvement Will obtain lumbar and thoracic MRI in setting of back pain and bacteremia Check orthopantogram Will follow   Principal Problem:   Acute respiratory failure with hypoxia ([place]) Active Problems:   OSA (obstructive sleep apnea)   Tobacco use   Hyperlipidemia LDL goal <70   S/P TAVR (transcatheter aortic valve replacement)   CAP (community acquired pneumonia)   Accelerated hypertension   COPD with acute exacerbation ([place])   MEDICATIONS:    Scheduled Meds:  amLODipine  10 mg Oral Daily   arformoterol  15 mcg Nebulization BID   atorvastatin  40 mg Oral Daily   budesonide (PULMICORT) nebulizer solution  0.25 mg Nebulization BID   finasteride  5 mg Oral Daily   gabapentin  300 mg Oral BID   guaiFENesin  400 mg Oral BID   heparin  5,000  Units Subcutaneous Q8H   metoprolol tartrate  25 mg Oral BID   nicotine  21 mg Transdermal Daily   predniSONE  40 mg Oral QAC breakfast   sertraline  50 mg Oral Daily   tamsulosin  0.4 mg Oral QHS   Continuous Infusions:  sodium chloride 10 mL/hr at 12/04/21 1922   ampicillin-sulbactam (UNASYN) IV 3 g (12/05/21 0706)   vancomycin 1,250 mg (12/04/21 1818)   PRN Meds:.sodium chloride, acetaminophen, albuterol, hydrALAZINE, ondansetron **OR** ondansetron (ZOFRAN) IV, traMADol  HPI:    Kyle Lewis is a 76 y.o. male with a past medical history significant for hypertension, obesity, COPD, OSA, CAD, TAVR, hyperlipidemia, tobacco use, AAA s/p repair, chronic back pain who presented to the emergency department on 11/30/2021 with worsening acute on chronic back pain.  He also endorsed cough, chills, wheezing.  He was noted to be hypoxic in the emergency department at 83% on room air.  He was also febrile to 100.4.  He was placed on supplemental oxygen with improvement.  He reported the symptoms for approximately 2 weeks that had worsened in the week prior.  His chest x-ray showed mild bibasilar atelectasis versus pneumonia.  He was subsequently admitted.  He was started on antibiotics azithromycin and Unasyn and treated for CAP/COPD exacerbation.  Vancomycin was added the following day.  His admission blood cultures are positive for GPC in clusters in 4 out of 4 bottles with gemella sanguinous.  Repeat blood cultures have been ordered today and we have  been consulted for further recommendations.  Review of patient's vitals over the last 24 hours indicate that he is afebrile, normal heart rate, hypertensive, and saturating above 90% on about 2 L of nasal cannula.   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   Abnormal gait 07/31/2019   Abscess of left leg    Acute on chronic diastolic heart failure (HCC)    Acute  respiratory failure with hypoxia and hypercapnia ([place]) 05/27/2021   HC03   06/14/21    =  31  - 06/23/2021   Walked 250 ft  at a slow pace with a cane. Complained of back and left knee hurting with lowest sat 89% so d/c'd 02    Adenomatous colon polyp    Adrenal mass ([place])    2 benign appearing left adrenal adenomas noted on 01/13/15 CT   Allergy    seasonal   Anxiety 04/08/2020   Arthritis    Blister of multiple sites of lower extremity 01/10/2018   Body mass index (BMI) 34.0-34.9, adult 04/07/2020   BPH with obstruction/lower urinary tract symptoms    Overview:  Probable based on symptoms   CAD (coronary artery disease) 02/01/2018   Carotid arterial disease ([place]) 05/23/2012   Carotid artery occlusion    Chronic back pain    Cigarette smoker 09/05/2018   COLONIC POLYPS, ADENOMATOUS, HX OF 03/17/2010   Qualifier: Diagnosis of  By: Harlon Ditty CMA (AAMA), Dottie     COPD GOLD II 04/07/2019   Quit smoking 03/2019 - PFT's  04/07/2019  FEV1 1.58 (57 % ) ratio 0.54  p 0 % improvement from saba p nothing prior to study with DLCO  15.22 (66%) corrects to 3.05 (74%)  for alv volume and FV curve concave classically     Degeneration of cervical intervertebral disc    Degeneration of lumbar intervertebral disc 06/14/2015   Depression 03/17/2010   Qualifier: Diagnosis of  By: Nelson-Smith CMA (AAMA), Dottie     Diverticulosis    DIVERTICULOSIS, COLON 03/17/2010   Qualifier: Diagnosis of  By: Harlon Ditty CMA (AAMA), Dottie     Dyspnea    Exogenous obesity 11/08/2013   Overview:  With a nine pound weight gain since his last visit   Heart murmur    History of back surgery    Rods and Screws in back   History of craniotomy 01/11/2018   History of left knee replacement    History of right MCA stroke 07/21/2014   Hyperlipidemia LDL goal <70 01/19/2018   Hypertension    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  Oneida Alar and 06/10/2015 last visit   Impairment of balance 10/27/2021   Inflammation of sacroiliac joint ([place]) 05/06/2019   Lower urinary tract symptoms (LUTS) 08/06/2017   Meningioma ([place])    Microscopic hematuria 11/08/2013   Mitral regurgitation 06/02/2021   Muscle weakness 10/27/2021   Neck pain 03/25/2019   Nocturia    OA (osteoarthritis) of knee 04/01/2012   OAB (overactive bladder) 01/19/2014   OSA (obstructive sleep apnea)    Bipap per Chodri since ? 2018 - Download 09/05/2018 used > 4 h x > 92% of days and avg use 8 h 42mn with AHI 3.1 @ 6 ipap and 10 epap    Osteoarthritis of right glenohumeral joint 08/14/2017   Other  specified postprocedural states 09/25/2017   Peripheral vascular disease ([place])    Pre-procedure lab exam 01/19/2018   Preop cardiovascular exam 10/24/2018   Ringing in ear    (SLIGHT)   S/P lumbar spinal fusion 07/07/2015   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   s/p TAVR with a 29 mm Edwards S3U via the TF approach by Dr. Angelena Form & Dr. Cyndia Bent   Severe aortic stenosis 02/01/2018   ECHO 01/30/18 - Left ventricle: The cavity size was normal. There was moderate   focal basal hypertrophy of the septum. Systolic function was   vigorous. The estimated ejection fraction was in the range of 65%   to 70%. Wall motion was normal; there were no regional wall   motion abnormalities. Doppler parameters are consistent with   abnormal left ventricular relaxation (grade 1 diastolic   dysfun   Status post AAA (abdominal aortic aneurysm) repair 02/01/2018   Stroke ([place]) 2013   tia no residual deficit from   Tobacco use 11/08/2013   Ventral hernia 11/08/2013   Overview:  S/p repair with alloderm mesh and s/p SB resection    Social History   Tobacco Use   Smoking status: Former    Packs/day: 0.75    Years: 40.00    Total pack years: 30.00    Types: Cigarettes    Quit date: 05/05/2021    Years since quitting: 0.5   Smokeless tobacco: Never   Tobacco comments:    1/2  pack a day if that   Vaping Use   Vaping Use: Never used  Substance Use Topics   Alcohol use: Yes    Comment: rare   Drug use: No    Family History  Problem Relation Age of Onset   Heart disease Mother        Onset ~68 y/o   Hypertension Mother        Deceased from old age at 52   Hyperlipidemia Mother    Arthritis Mother    Diabetes Father        Deceased from old age at 24   Heart attack Father    Heart disease Father        CABG at age 55   Dementia Father 74   Arthritis Father    Prostate cancer Maternal Grandfather    Colon cancer Neg Hx    Esophageal cancer Neg Hx    Rectal cancer Neg Hx    Stomach cancer Neg Hx     Allergies  Allergen Reactions   Cefazolin Rash and Other (See Comments)    The patient had surgery and was given cefazolin intraop. ~ 10 days later he developed a rash confirmed by biopsy to be consistent w/ drug eruption. We cannot know for sure, but this is the most likely agent.      Review of Systems  Constitutional:  Positive for fever and malaise/fatigue.  HENT: Negative.    Respiratory:  Positive for cough and shortness of breath.   Cardiovascular: Negative.   Gastrointestinal: Negative.   Genitourinary: Negative.   Musculoskeletal:  Positive for back pain.  Skin: Negative.   All other systems reviewed and are negative.   OBJECTIVE:   Blood pressure 128/66, pulse 72, temperature 98 F (36.7 C), temperature source Axillary, resp. rate (!) 22, height '5\' 8"'$  (1.727 m), weight 98.8 kg, SpO2 94 %. Body mass index is 33.12 kg/m.  Physical Exam Constitutional:      General: He is not in acute distress.  Appearance: Normal appearance.  HENT:     Head: Normocephalic and atraumatic.     Mouth/Throat:     Comments: His dentition is poor with what appear to be many dental caries.  Eyes:     Extraocular Movements: Extraocular movements intact.     Conjunctiva/sclera: Conjunctivae normal.  Cardiovascular:     Rate and Rhythm: Normal  rate and regular rhythm.  Pulmonary:     Effort: Pulmonary effort is normal. No respiratory distress.     Comments: Currently on 2 liters via [place].  Abdominal:     General: There is no distension.     Palpations: Abdomen is soft.     Tenderness: There is no abdominal tenderness.  Musculoskeletal:        General: Tenderness present.     Cervical back: Normal range of motion and neck supple.     Right lower leg: No edema.     Left lower leg: No edema.     Comments: Previous lumbar incision well healed. He is tender along with lumbar and thoracic spine.   Skin:    General: Skin is warm and dry.  Neurological:     General: No focal deficit present.     Mental Status: He is alert and oriented to person, place, and time.  Psychiatric:        Mood and Affect: Mood normal.        Behavior: Behavior normal.      Lab Results: Lab Results  Component Value Date   WBC 12.2 (H) 12/01/2021   HGB 12.4 (L) 12/01/2021   HCT 36.7 (L) 12/01/2021   MCV 95.3 12/01/2021   PLT 172 12/01/2021    Lab Results  Component Value Date   NA 136 12/05/2021   K 4.4 12/05/2021   CO2 34 (H) 12/05/2021   GLUCOSE 100 (H) 12/05/2021   BUN 21 12/05/2021   CREATININE 0.76 12/05/2021   CALCIUM 8.2 (L) 12/05/2021   GFRNONAA >60 12/05/2021   GFRAA 107 04/08/2020    Lab Results  Component Value Date   ALT 18 12/01/2021   AST 15 12/01/2021   ALKPHOS 68 12/01/2021   BILITOT 0.8 12/01/2021    No results found for: "CRP"     Component Value Date/Time   ESRSEDRATE 8 09/25/2019 1110    I have reviewed the micro and lab results in Epic.  Imaging: No results found.   Imaging independently reviewed in Epic.  Raynelle [facility] for Infectious Disease [facility] [phone] pager 12/05/2021, 12:44 PM

## 2021-12-05 NOTE — Progress Notes (Signed)
Pharmacy Antibiotic Note  Kyle Lewis is a 76 y.o. male admitted on 11/30/2021 with gemella sanguinus in 2/2 blood cultures. Noted that patient has a history of a TAVR and gemella is a potential cause of endocarditis.  Pharmacy has been consulted for gentamicin dosing. Patient has been afebrile. Scr stable at 0.76.   Plan: Penicillin G 12 million units every 12 hours Gentamicin 240 mg (~ 3 mg/kg AdjBW) every 24 hours Monitor renal function, trough levels as appropriate and gemella sensitivities if available    Height: '5\' 8"'$  (172.7 cm) Weight: 98.8 kg (217 lb 13 oz) IBW/kg (Calculated) : 68.4  Temp (24hrs), Avg:98.1 F (36.7 C), Min:98 F (36.7 C), Max:98.2 F (36.8 C)  Recent Labs  Lab 11/30/21 1000 12/01/21 0402 12/05/21 0540  WBC 13.6* 12.2*  --   CREATININE 0.81 0.93 0.76  LATICACIDVEN 1.2  --   --     Estimated Creatinine Clearance: 91 mL/min (by C-G formula based on SCr of 0.76 mg/dL).    Allergies  Allergen Reactions   Cefazolin Rash and Other (See Comments)    The patient had surgery and was given cefazolin intraop. ~ 10 days later he developed a rash confirmed by biopsy to be consistent w/ drug eruption. We cannot know for sure, but this is the most likely agent.       Thank you for allowing pharmacy to be a part of this patient's care.  Jimmy Footman, PharmD, BCPS, [facility] Clinical Pharmacist Phone: [phone] 12/05/2021 1:09 PM

## 2021-12-05 NOTE — Care Management Important Message (Signed)
Important Message  Patient Details  Name: CARLYLE MCELRATH MRN: [id number] Date of Birth: Oct 11, 1945   Medicare Important Message Given:  Yes     [name] 12/05/2021, 10:42 AM

## 2021-12-05 NOTE — NC FL2 (Signed)
[place] LEVEL OF CARE SCREENING TOOL     IDENTIFICATION  Patient Name: Kyle Lewis Birthdate: [birth year] Sex: male Admission Date (Current Location): 11/30/2021  [facility] and [place] Number:  Herbalist and Address:  [facility]. [facility], [place] [address], [place], [place] [address]      Provider Number: [id number]  Attending Physician Name and Address:  Mendel Corning, MD  Relative Name and Phone Number:  [name] [phone]    Current Level of Care: Hospital Recommended Level of Care: [place] Prior Approval Number:    Date Approved/Denied:   PASRR Number: [id number]  Discharge Plan: SNF    Current Diagnoses: Patient Active Problem List   Diagnosis Date Noted   Accelerated hypertension 12/01/2021   Acute respiratory failure with hypoxia ([place]) 12/01/2021   COPD with acute exacerbation ([place]) 12/01/2021   CAP (community acquired pneumonia) 11/30/2021   H/O abdominal aortic aneurysm repair 11/09/2021   Olecranon bursitis of left elbow 11/08/2021   AAA (abdominal aortic aneurysm) ([place]) 11/07/2021   Abscess of left leg 11/07/2021   Adrenal mass ([place]) 11/07/2021   Allergy 11/07/2021   Arthritis 11/07/2021   Carotid artery occlusion 11/07/2021   Degeneration of cervical intervertebral disc 11/07/2021   Diverticulosis 11/07/2021   Dyspnea 11/07/2021   Heart murmur 11/07/2021   History of back surgery 11/07/2021   History of left knee replacement 11/07/2021   Meningioma ([place]) 11/07/2021   Ringing in ear 11/07/2021   Impairment of balance 10/27/2021   Muscle weakness 10/27/2021   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   Mitral regurgitation 06/02/2021   Acute on chronic diastolic heart failure ([place])    Acute respiratory failure with hypoxia and hypercapnia ([place]) 05/27/2021   Anxiety 04/08/2020   Body mass index (BMI) 34.0-34.9, adult 04/07/2020   Abnormal gait 07/31/2019    Inflammation of sacroiliac joint ([place]) 05/06/2019   COPD GOLD II 04/07/2019   Neck pain 03/25/2019   Preop cardiovascular exam 10/24/2018   Cigarette smoker 09/05/2018   CAD (coronary artery disease) 02/01/2018   Severe aortic stenosis 02/01/2018   Status post AAA (abdominal aortic aneurysm) repair 02/01/2018   Pre-procedure lab exam 01/19/2018   Hyperlipidemia LDL goal <70 01/19/2018   History of craniotomy 01/11/2018   Blister of multiple sites of lower extremity 01/10/2018   Adenomatous colon polyp 01/10/2018   Other specified postprocedural states 09/25/2017   Osteoarthritis of right glenohumeral joint 08/14/2017   Lower urinary tract symptoms (LUTS) 08/06/2017   S/P lumbar spinal fusion 07/07/2015   Iliac artery aneurysm, left ([place]) 07/01/2015   Peripheral vascular disease (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   Microscopic hematuria 11/08/2013   Tobacco use 11/08/2013   Nocturia 11/08/2013   Ventral hernia 11/08/2013   Carotid arterial disease ([place]) 05/23/2012   Hypertension    OSA (obstructive sleep apnea)    Chronic back pain    BPH with obstruction/lower urinary tract symptoms    OA (osteoarthritis) of knee 04/01/2012   Depression 03/17/2010   DIVERTICULOSIS, COLON 03/17/2010   COLONIC POLYPS, ADENOMATOUS, HX OF 03/17/2010    Orientation RESPIRATION BLADDER Height & Weight     Self, Time, Situation, Place  O2 (Nasal Cannula 2 liters) Continent, External catheter (External  Urianry Catheter) Weight: 217 lb 13 oz (98.8 kg) Height:  '5\' 8"'$  (172.7 cm)  BEHAVIORAL SYMPTOMS/MOOD NEUROLOGICAL BOWEL NUTRITION STATUS      Continent Diet (Please see discharge summary)  AMBULATORY STATUS COMMUNICATION OF NEEDS Skin   Limited Assist  Verbally Other (Comment) (Pale,dry,Abrasion,face,arm,leg,bilateral,ecchymosis,arm,leg,bilateral)                       Personal Care Assistance Level of Assistance  Bathing, Feeding, Dressing Bathing Assistance: Limited assistance Feeding assistance: Independent (able to feed self,Cardiac) Dressing Assistance: Limited assistance     Functional Limitations Info  Sight, Hearing, Speech Sight Info: Adequate Hearing Info: Adequate Speech Info: Adequate    SPECIAL CARE FACTORS FREQUENCY  PT (By licensed PT), OT (By licensed OT)     PT Frequency: 5x min weekly OT Frequency: 5x min weekly            Contractures Contractures Info: Not present    Additional Factors Info  Code Status, Allergies, Psychotropic Code Status Info: FULL Allergies Info: Cefazolin Psychotropic Info: sertraline (ZOLOFT) tablet 50 mg daily         Current Medications (12/05/2021):  This is the current hospital active medication list Current Facility-Administered Medications  Medication Dose Route Frequency Provider Last Rate Last Admin   0.9 %  sodium chloride infusion   Intravenous PRN Rai, Ripudeep K, MD 10 mL/hr at 12/04/21 1922 Infusion Verify at 12/04/21 1922   acetaminophen (TYLENOL) tablet 650 mg  650 mg Oral QHS PRN Clance Boll, MD   650 mg at 12/02/21 2237   albuterol (PROVENTIL) (2.5 MG/3ML) 0.083% nebulizer solution 2.5 mg  2.5 mg Nebulization Q2H PRN Clance Boll, MD       amLODipine (NORVASC) tablet 10 mg  10 mg Oral Daily Rai, Ripudeep K, MD   10 mg at 12/05/21 1010   Ampicillin-Sulbactam (UNASYN) 3 g in sodium chloride 0.9 % 100 mL IVPB  3 g Intravenous Q6H Myles Rosenthal A, MD 200 mL/hr at 12/05/21 0706 3 g at 12/05/21 0706   arformoterol (BROVANA) nebulizer solution 15 mcg  15 mcg Nebulization BID Rai, Ripudeep K, MD   15 mcg at 12/05/21 0905   atorvastatin (LIPITOR) tablet 40 mg  40 mg Oral Daily Myles Rosenthal A, MD   40 mg at 12/05/21 1010   budesonide  (PULMICORT) nebulizer solution 0.25 mg  0.25 mg Nebulization BID Rai, Ripudeep K, MD   0.25 mg at 12/05/21 0905   finasteride (PROSCAR) tablet 5 mg  5 mg Oral Daily Myles Rosenthal A, MD   5 mg at 12/05/21 1010   gabapentin (NEURONTIN) capsule 300 mg  300 mg Oral BID Myles Rosenthal A, MD   300 mg at 12/05/21 1010   guaiFENesin tablet 400 mg  400 mg Oral BID Myles Rosenthal A, MD   400 mg at 12/05/21 1018   heparin injection 5,000 Units  5,000 Units Subcutaneous Q8H Myles Rosenthal A, MD   5,000 Units at 12/05/21 3500   hydrALAZINE (APRESOLINE) injection 10 mg  10 mg Intravenous Q6H PRN Rai, Ripudeep K, MD   10 mg at 12/01/21 0819   metoprolol tartrate (LOPRESSOR) tablet 25 mg  25 mg Oral BID Rai, Ripudeep K, MD   25 mg at 12/05/21 1018   nicotine (NICODERM CQ - dosed in mg/24 hours) patch 21 mg  21 mg Transdermal Daily Rai, Ripudeep K, MD   21 mg at 12/05/21 1020   ondansetron (ZOFRAN) tablet 4 mg  4  mg Oral Q6H PRN Clance Boll, MD       Or   ondansetron [facility]) injection 4 mg  4 mg Intravenous Q6H PRN Clance Boll, MD       predniSONE (DELTASONE) tablet 40 mg  40 mg Oral QAC breakfast Rai, Ripudeep K, MD   40 mg at 12/05/21 0612   sertraline (ZOLOFT) tablet 50 mg  50 mg Oral Daily Myles Rosenthal A, MD   50 mg at 12/05/21 1010   tamsulosin (FLOMAX) capsule 0.4 mg  0.4 mg Oral QHS Myles Rosenthal A, MD   0.4 mg at 12/04/21 2243   traMADol (ULTRAM) tablet 100 mg  100 mg Oral Q8H PRN Rai, Ripudeep K, MD   100 mg at 12/05/21 0703   vancomycin (VANCOREADY) IVPB 1250 mg/250 mL  1,250 mg Intravenous Q24H Rolla Flatten, RPH 166.7 mL/hr at 12/04/21 1818 1,250 mg at 12/04/21 1818     Discharge Medications: Please see discharge summary for a list of discharge medications.  Relevant Imaging Results:  Relevant Lab Results:   Additional Information [id number], Both Ar[place], LCSWA

## 2021-12-05 NOTE — TOC Initial Note (Addendum)
Transition of Care [facility]) - Initial/Assessment Note    Patient Details  Name: Kyle Lewis MRN: [id number] Date of Birth: 20-Nov-1945  Transition of Care [facility] CM/SW Contact:    Milas Gain, [place] Phone Number: 12/05/2021, 12:46 PM  Clinical Narrative:            Update- CSW received call back from [place] with [facility] who confirmed SNF bed offer for patient.CSW informed MD. CSW will continue to follow.  Update- CSW provided patient with SNF bed offer. Patients accepted SNF bed offer with [facility]. CSW called tracy to confirm. CSW awaiting callback.         CSW received consult for possible SNF placement at time of discharge. CSW spoke with patient at bedside regarding PT recommendation of SNF placement at time of discharge. Patient reports he comes from home with spouse. Patient expressed understanding of PT recommendation and is agreeable to SNF placement at time of discharge. Patient gave CSW permission to fax out initial referral near the [place] area. CSW discussed insurance authorization process with patient. Patient reports he has received both COVID vaccines. Patient expressed being hopeful for rehab and to feel better soon. No further questions reported at this time. Patient reports his spouse can  bring his Bipap from home to facility.MD confirmed Patient can use Bipap from home at SNF.CSW to continue to follow and assist with discharge planning needs.   Expected Discharge Plan: Skilled Nursing Facility Barriers to Discharge: Continued Medical Work up   Patient Goals and CMS Choice Patient states their goals for this hospitalization and ongoing recovery are:: SNF CMS Medicare.gov Compare Post Acute Care list provided to:: Patient Choice offered to / list presented to : Patient  Expected Discharge Plan and Services Expected Discharge Plan: [place] In-house Referral: Clinical Social Work     Living arrangements for the past 2 months: [place]                                      Prior Living Arrangements/Services Living arrangements for the past 2 months: Single Family Home Lives with:: Spouse Patient language and need for interpreter reviewed:: Yes Do you feel safe going back to the place where you live?: No   SNF  Need for Family Participation in Patient Care: Yes (Comment) Care giver support system in place?: Yes (comment)   Criminal Activity/Legal Involvement Pertinent to Current Situation/Hospitalization: No - Comment as needed  Activities of Daily Living Home Assistive Devices/Equipment: CPAP, Cane (specify quad or straight), Walker (specify type), Nebulizer, Eyeglasses ADL Screening (condition at time of admission) Patient's cognitive ability adequate to safely complete daily activities?: Yes Is the patient deaf or have difficulty hearing?: No Does the patient have difficulty seeing, even when wearing glasses/contacts?: No Does the patient have difficulty concentrating, remembering, or making decisions?: No Patient able to express need for assistance with ADLs?: Yes Does the patient have difficulty dressing or bathing?: Yes Independently performs ADLs?: Yes (appropriate for developmental age) Does the patient have difficulty walking or climbing stairs?: Yes Weakness of Legs: Left Weakness of Arms/Hands: Left  Permission Sought/Granted Permission sought to share information with : Case Manager, Family Supports, Chartered certified accountant granted to share information with : Yes, Verbal Permission Granted  Share Information with NAME: [name]  Permission granted to share info w AGENCY: SNF  Permission granted to share info w Relationship: spouse  Permission granted to share info w Contact Information: [name] [phone]  Emotional Assessment Appearance:: Appears stated age Attitude/Demeanor/Rapport: Gracious Affect (typically observed): Calm Orientation: : Oriented to Self,  Oriented to Place, Oriented to  Time, Oriented to Situation Alcohol / Substance Use: Not Applicable Psych Involvement: No (comment)  Admission diagnosis:  Hypoxia [R09.02] CAP (community acquired pneumonia) [J18.9] Community acquired pneumonia, unspecified laterality [J18.9] Patient Active Problem List   Diagnosis Date Noted   Accelerated hypertension 12/01/2021   Acute respiratory failure with hypoxia ([place]) 12/01/2021   COPD with acute exacerbation ([place]) 12/01/2021   CAP (community acquired pneumonia) 11/30/2021   H/O abdominal aortic aneurysm repair 11/09/2021   Olecranon bursitis of left elbow 11/08/2021   AAA (abdominal aortic aneurysm) ([place]) 11/07/2021   Abscess of left leg 11/07/2021   Adrenal mass ([place]) 11/07/2021   Allergy 11/07/2021   Arthritis 11/07/2021   Carotid artery occlusion 11/07/2021   Degeneration of cervical intervertebral disc 11/07/2021   Diverticulosis 11/07/2021   Dyspnea 11/07/2021   Heart murmur 11/07/2021   History of back surgery 11/07/2021   History of left knee replacement 11/07/2021   Meningioma ([place]) 11/07/2021   Ringing in ear 11/07/2021   Impairment of balance 10/27/2021   Muscle weakness 10/27/2021   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   Mitral regurgitation 06/02/2021   Acute on chronic diastolic heart failure (HCC)    Acute respiratory failure with hypoxia and hypercapnia ([place]) 05/27/2021   Anxiety 04/08/2020   Body mass index (BMI) 34.0-34.9, adult 04/07/2020   Abnormal gait 07/31/2019   Inflammation of sacroiliac joint ([place]) 05/06/2019   COPD GOLD II 04/07/2019   Neck pain 03/25/2019   Preop cardiovascular exam 10/24/2018   Cigarette smoker 09/05/2018   CAD (coronary artery disease) 02/01/2018   Severe aortic stenosis 02/01/2018   Status post AAA (abdominal aortic aneurysm) repair 02/01/2018   Pre-procedure lab exam 01/19/2018   Hyperlipidemia LDL goal <70 01/19/2018   History of craniotomy 01/11/2018    Blister of multiple sites of lower extremity 01/10/2018   Adenomatous colon polyp 01/10/2018   Other specified postprocedural states 09/25/2017   Osteoarthritis of right glenohumeral joint 08/14/2017   Lower urinary tract symptoms (LUTS) 08/06/2017   S/P lumbar spinal fusion 07/07/2015   Iliac artery aneurysm, left ([place]) 07/01/2015   Peripheral vascular disease ([place]) 07/01/2015   Degeneration of lumbar intervertebral disc 06/14/2015   History of right MCA stroke 07/21/2014   OAB (overactive bladder) 01/19/2014   Stroke ([place]) 01/01/2014   Exogenous obesity 11/08/2013   Hypertonicity of bladder 11/08/2013   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   Microscopic hematuria 11/08/2013   Tobacco use 11/08/2013   Nocturia 11/08/2013   Ventral hernia 11/08/2013   Carotid arterial disease ([place]) 05/23/2012   Hypertension    OSA (obstructive sleep apnea)    Chronic back pain    BPH with obstruction/lower urinary tract symptoms    OA (osteoarthritis) of knee 04/01/2012   Depression 03/17/2010   DIVERTICULOSIS, COLON 03/17/2010   COLONIC POLYPS, ADENOMATOUS, HX OF 03/17/2010   PCP:  [name], MD Pharmacy:   [facility] [address] [place] - [place] [address] [place] [address] Phone: [phone] Fax: [phone]  [facility] [address] [place] [address] Phone: [phone] Fax: [place], [place] - [address] AT [place] [place] [address] Phone: [phone] Fax: [phone]  [facility] [place] [place] [address] Phone: [phone] Fax: [phone]     Social Determinants of Health (SDOH) Interventions    Readmission Risk Interventions    06/10/2021   10:04 AM  Readmission Risk Prevention Plan  Transportation  Screening Complete  PCP or Specialist Appt within 5-7 Days Complete  Home Care Screening Complete  Medication Review (RN CM) Complete

## 2021-12-05 NOTE — Progress Notes (Signed)
Occupational Therapy Treatment Patient Details Name: Kyle Lewis MRN: [id number] DOB: November 11, 1945 Today's Date: 12/05/2021   History of present illness 76 y.o. male presents to [facility] hospital on 11/30/2021 with increase in back pain as well as cough and chills. Pt found to have CAP with associated respiratory failure. PMH includes HTN, COPD, OSA, CAD, AAA, chronic back pain.   OT comments  Pt requiring mod assist for bed mobility, mod assist to stand and min assist to ambulate with RW.  Completed grooming seated in recliner. Pt with flat affect and reports significant back and shoulder pain. Pt slow to follow directions. Assisted pt to call for breakfast tray. Updated d/c to SNF for ST rehab.    Recommendations for follow up therapy are one component of a multi-disciplinary discharge planning process, led by the attending physician.  Recommendations may be updated based on patient status, additional functional criteria and insurance authorization.    Follow Up Recommendations  Skilled nursing-short term rehab (<3 hours/day)    Assistance Recommended at Discharge Frequent or constant Supervision/Assistance  Patient can return home with the following  A lot of help with walking and/or transfers;A lot of help with bathing/dressing/bathroom;Assistance with cooking/housework;Direct supervision/assist for medications management;Direct supervision/assist for financial management;Assist for transportation;Help with stairs or ramp for entrance   Equipment Recommendations  None recommended by OT    Recommendations for Other Services      Precautions / Restrictions Precautions Precautions: Fall Restrictions Weight Bearing Restrictions: No       Mobility Bed Mobility Overal bed mobility: Needs Assistance Bed Mobility: Rolling, Sidelying to Sit Rolling: Min assist Sidelying to sit: Mod assist       General bed mobility comments: instructed in log roll technique to minimize back pain, +use  of rail    Transfers Overall transfer level: Needs assistance Equipment used: Rolling walker (2 wheels) Transfers: Sit to/from Stand Sit to Stand: Mod assist           General transfer comment: cues for hand placement, elevated bed, assist to rise and steady     Balance Overall balance assessment: Needs assistance   Sitting balance-Leahy Scale: Fair     Standing balance support: Bilateral upper extremity supported, Reliant on assistive device for balance Standing balance-Leahy Scale: Poor                             ADL either performed or assessed with clinical judgement   ADL Overall ADL's : Needs assistance/impaired Eating/Feeding: Set up;Sitting   Grooming: Wash/dry hands;Wash/dry face;Sitting;Brushing hair           Upper Body Dressing : Supervision/safety;Sitting   Lower Body Dressing: Total assistance;Bed level Lower Body Dressing Details (indicate cue type and reason): assisted pt who typically uses AE                    Extremity/Trunk Assessment              Vision       Perception     Praxis      Cognition Arousal/Alertness: Awake/alert Behavior During Therapy: Flat affect Overall Cognitive Status: Impaired/Different from baseline Area of Impairment: Problem solving, Following commands                       Following Commands: Follows one step commands with increased time     Problem Solving: Slow processing, Difficulty sequencing, Requires verbal cues General Comments:  pt needing assistance to place breakfast order        Exercises      Shoulder Instructions       General Comments      Pertinent Vitals/ Pain       Pain Assessment Pain Assessment: Faces Faces Pain Scale: Hurts even more Pain Location: back, L shoulder Pain Descriptors / Indicators: Sharp, Moaning Pain Intervention(s): Monitored during session  Home Living                                          Prior  Functioning/Environment              Frequency  Min 2X/week        Progress Toward Goals  OT Goals(current goals can now be found in the care plan section)  Progress towards OT goals: Progressing toward goals  Acute Rehab OT Goals OT Goal Formulation: With patient Time For Goal Achievement: 12/17/21 Potential to Achieve Goals: [place] Discharge plan needs to be updated    Co-evaluation                 AM-PAC OT "6 Clicks" Daily Activity     Outcome Measure   Help from another person eating meals?: A Little Help from another person taking care of personal grooming?: A Little Help from another person toileting, which includes using toliet, bedpan, or urinal?: A Little Help from another person bathing (including washing, rinsing, drying)?: A Lot Help from another person to put on and taking off regular upper body clothing?: A Little Help from another person to put on and taking off regular lower body clothing?: A Lot 6 Click Score: 16    End of Session Equipment Utilized During Treatment: Gait belt;Rolling walker (2 wheels);Oxygen  OT Visit Diagnosis: Unsteadiness on feet (R26.81);Other abnormalities of gait and mobility (R26.89);Muscle weakness (generalized) (M62.81);Pain   Activity Tolerance Patient tolerated treatment well   Patient Left in chair;with call bell/phone within reach;with chair alarm set   Nurse Communication          Time: [phone] OT Time Calculation (min): 26 min  Charges: OT General Charges $OT Visit: 1 Visit OT Treatments $Self Care/Home Management : 23-37 mins  Cleta Alberts, OTR/L Acute Rehabilitation Services Office: [phone]  Malka So 12/05/2021, 9:59 AM

## 2021-12-05 NOTE — Progress Notes (Signed)
[facility]                                                                              Kyle Lewis, is a 76 y.o. male, DOB - 03-03-1946, TIW:[id number] Admit date - 11/30/2021    Outpatient Primary MD for the patient is [name], MD  LOS - 5  days  Chief Complaint  Patient presents with   Back Pain       Brief summary   Patient is a 76 year old male with hypertension, obesity, COPD, OSA, CAD, AAA repair, TAVR, hyperlipidemia, tobacco use, chronic back pain presented to ED with worsening back pain despite use of Ultram and prednisone given to the patient on 6/23.  Patient also reported cough with chills and wheezing.  Patient was hypoxic in ED to 83% on room air.  Patient was placed on 4 L O2 via [place] and sats improved to 94%.  Patient reported symptoms for 2 weeks and worse in the last 1 week. Chest x-ray showed mild bibasilar atelectasis and/or pneumonia.  In ED, patient was noted to have increasing O2 needs and desatted to 70%.  He was transitioned to NRB.  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 -Blood culture sensitivities returned back as Gemella sanguinis, concerning for endocarditis in the setting of prior TAVR  -ID consulted, appreciate recommendations starting penicillin and gentamicin for suspected TAVR endocarditis -Cardiology consulted for TEE, scheduled on 12/08/2021 at 10:30 AM -Follow lumbar and thoracic MRI, orthopantogram    Accelerated hypertension -BP improving continue metoprolol, Norvasc -2D echo in 07/2021 had shown EF of 60 to 65%, G1 DD    COPD with acute exacerbation ([place]) -Wheezing much improved continue scheduled DuoNebs,  Pulmicort, Brovana,  -Oral prednisone for 3 days   OSA (obstructive sleep apnea) -Continue CPAP    Tobacco use -Counseled smoking cessation    Hyperlipidemia LDL goal <70 -Continue statin  AAA repair -CTA noted no acute dissection or acute aortic process.  A tiny endoleak in the anterior endosac would be difficult to exclude, smaller than on previous imaging, potentially representing calcification, recommended follow-up -Explained above to the patient and will follow-up with cardiology outpatient.  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   Weight as of this encounter: 98.8 kg.  Code Status: Full code DVT Prophylaxis:  heparin injection 5,000 Units Start: 11/30/21 1630   Level of Care: Level of care: Progressive Family Communication: Updated patient's wife in detail on the phone regarding new findings of bacteremia, concern for endocarditis and need for further work-up.  Plan for disposition to SNF when medically ready   Disposition Plan:      Remains inpatient appropriate: Suspected Endocarditis, on IV antibiotics  Procedures:    Consultants:  Infectious disease  Antimicrobials:   Anti-infectives (From admission, onward)    Start     Dose/Rate Route Frequency Ordered Stop   12/05/21 1800  gentamicin (GARAMYCIN) 240 mg in dextrose 5 % 50 mL IVPB        3 mg/kg  80.6 kg (Adjusted) 112 mL/hr over 30 Minutes Intravenous Every 24 hours 12/05/21 1317     12/05/21 1400  penicillin G potassium 12 Million Units in dextrose 5 % 500 mL continuous infusion        12 Million Units 41.7 mL/hr over 12 Hours Intravenous Every 12 hours 12/05/21 1305     12/02/21 1800  vancomycin (VANCOREADY) IVPB 1250 mg/250 mL  Status:  Discontinued        1,250 mg 166.7 mL/hr over 90 Minutes Intravenous Every 24 hours 12/01/21  1610 12/05/21 1305   12/01/21 1800  azithromycin (ZITHROMAX) tablet 500 mg        500 mg Oral Every evening 12/01/21 0832 12/04/21 1714   12/01/21 1700  vancomycin (VANCOREADY) IVPB 1750 mg/350 mL        1,750 mg 175 mL/hr over 120 Minutes Intravenous  Once 12/01/21 1610 12/01/21 2045   11/30/21 1630  Ampicillin-Sulbactam (UNASYN) 3 g in sodium chloride 0.9 % 100 mL IVPB  Status:  Discontinued        3 g 200 mL/hr over 30 Minutes Intravenous Every 6 hours 11/30/21 1621 12/05/21 1305   11/30/21 1630  azithromycin (ZITHROMAX) 500 mg in sodium chloride 0.9 % 250 mL IVPB  Status:  Discontinued        500 mg 250 mL/hr over 60 Minutes Intravenous Every 24 hours 11/30/21 1622 12/01/21 0832   11/30/21 0930  Ampicillin-Sulbactam (UNASYN) 3 g in sodium chloride 0.9 % 100 mL IVPB        3 g 200 mL/hr over 30 Minutes Intravenous  Once 11/30/21 0923 11/30/21 1120          Medications  amLODipine  10 mg Oral Daily   arformoterol  15 mcg Nebulization BID   atorvastatin  40 mg Oral Daily   budesonide (PULMICORT) nebulizer solution  0.25 mg Nebulization BID   finasteride  5 mg Oral Daily   gabapentin  300 mg Oral BID   guaiFENesin  400 mg Oral BID   heparin  5,000 Units Subcutaneous Q8H   metoprolol tartrate  25 mg Oral BID   nicotine  21 mg Transdermal Daily   predniSONE  40 mg Oral QAC breakfast   sertraline  50 mg Oral Daily   tamsulosin  0.4 mg Oral QHS      Subjective:   Kyle Lewis was seen and examined today.  Wheezing has improved, on 2 L O2 via [place].  He did have worsening back pain prior to admission, currently stable.  No fevers, no coughing at this time.  Objective:   Vitals:   12/05/21 0521 12/05/21 0906 12/05/21 1018 12/05/21 1538  BP: (!) 165/90  128/66 93/67  Pulse: 61  72 68  Resp: (!) 22   17  Temp: 98 F (36.7 C)   98.4 F (36.9 C)  TempSrc: Axillary   Oral  SpO2: 94% 94%  95%  Weight: 98.8 kg     Height:        Intake/Output Summary (Last 24 hours) at  12/05/2021 1541 Last data filed at 12/05/2021 0954 Gross per 24 hour  Intake 939.73 ml  Output 2500 ml  Net -1560.27 ml  Wt Readings from Last 3 Encounters:  12/05/21 98.8 kg  11/25/21 97.8 kg  11/09/21 97.8 kg    Physical Exam General: Alert and oriented x 3, NAD Cardiovascular: S1 S2 clear, RRR. No pedal edema b/l Respiratory: CTAB, no wheezing Gastrointestinal: Soft, nontender, nondistended, NBS Ext: no pedal edema bilaterally Neuro: no new deficits Psych: Normal affect and demeanor, alert and oriented x3    Data Reviewed:  I have personally reviewed following labs    CBC Lab Results  Component Value Date   WBC 12.2 (H) 12/01/2021   RBC 3.85 (L) 12/01/2021   HGB 12.4 (L) 12/01/2021   HCT 36.7 (L) 12/01/2021   MCV 95.3 12/01/2021   MCH 32.2 12/01/2021   PLT 172 12/01/2021   MCHC 33.8 12/01/2021   RDW 14.4 12/01/2021   LYMPHSABS 0.8 11/30/2021   MONOABS 0.9 11/30/2021   EOSABS 0.0 11/30/2021   BASOSABS 0.0 24/23/5361     Last metabolic panel Lab Results  Component Value Date   NA 136 12/05/2021   K 4.4 12/05/2021   CL 97 (L) 12/05/2021   CO2 34 (H) 12/05/2021   BUN 21 12/05/2021   CREATININE 0.76 12/05/2021   GLUCOSE 100 (H) 12/05/2021   GFRNONAA >60 12/05/2021   GFRAA 107 04/08/2020   CALCIUM 8.2 (L) 12/05/2021   PROT 5.8 (L) 12/01/2021   ALBUMIN 2.4 (L) 12/01/2021   LABGLOB 2.7 09/25/2019   AGRATIO 1.4 09/25/2019   BILITOT 0.8 12/01/2021   ALKPHOS 68 12/01/2021   AST 15 12/01/2021   ALT 18 12/01/2021   ANIONGAP 5 12/05/2021    CBG (last 3)  Recent Labs    12/03/21 0824 12/04/21 0819  GLUCAP 101* 136*        Kyle Lewis M.D. [facility] 12/05/2021, 3:41 PM  Available via Epic secure chat 7am-7pm After 7 pm, please refer to night coverage provider listed on amion.

## 2021-12-06 ENCOUNTER — Inpatient Hospital Stay (HOSPITAL_COMMUNITY): Payer: Medicare Other

## 2021-12-06 DIAGNOSIS — I10 Essential (primary) hypertension: Secondary | ICD-10-CM | POA: Diagnosis not present

## 2021-12-06 DIAGNOSIS — J189 Pneumonia, unspecified organism: Secondary | ICD-10-CM | POA: Diagnosis not present

## 2021-12-06 DIAGNOSIS — J9601 Acute respiratory failure with hypoxia: Secondary | ICD-10-CM | POA: Diagnosis not present

## 2021-12-06 DIAGNOSIS — I38 Endocarditis, valve unspecified: Secondary | ICD-10-CM | POA: Diagnosis not present

## 2021-12-06 DIAGNOSIS — J441 Chronic obstructive pulmonary disease with (acute) exacerbation: Secondary | ICD-10-CM | POA: Diagnosis not present

## 2021-12-06 LAB — ECHOCARDIOGRAM COMPLETE
AR max vel: 1.14 cm2
AV Area VTI: 1.08 cm2
AV Area mean vel: 1.15 cm2
AV Mean grad: 10 mmHg
AV Peak grad: 20.4 mmHg
Ao pk vel: 2.26 m/s
Area-P 1/2: 2.83 cm2
Height: 68 in
S' Lateral: 3.3 cm
Weight: 3485.03 oz

## 2021-12-06 LAB — CREATININE, SERUM
Creatinine, Ser: 0.89 mg/dL (ref 0.61–1.24)
GFR, Estimated: >60 mL/min (ref >60)

## 2021-12-06 LAB — GLUCOSE, CAPILLARY
Glucose-Capillary: 164 mg/dL — ABNORMAL HIGH (ref 70–99)
Glucose-Capillary: 206 mg/dL — ABNORMAL HIGH (ref 70–99)

## 2021-12-06 MED ORDER — PERFLUTREN LIPID MICROSPHERE
1.0000 mL | INTRAVENOUS | Status: AC | PRN
  Administered 2021-12-06: 2 mL via INTRAVENOUS

## 2021-12-06 NOTE — Progress Notes (Signed)
  Echocardiogram 2D Echocardiogram has been performed.  Kyle Lewis F 12/06/2021, 4:07 PM

## 2021-12-06 NOTE — Progress Notes (Signed)
Secure chat sent to nurse. Pt is currently on bedpan/pt care. Instructed to re-consult IV team when patient is ready for IV team assessment/placement of PIV. Fran Lowes, RN VAST

## 2021-12-06 NOTE — Progress Notes (Signed)
IVT consulted for difficult PIV placement.  Pt OTF upon arrival in MRI.  RN to place new consult when pt is back in room and ready for IV assessment.

## 2021-12-06 NOTE — Progress Notes (Signed)
[facility]                                                                              Kyle Lewis, is a 76 y.o. male, DOB - 11/25/1945, VOZ:[id number] Admit date - 11/30/2021    Outpatient Primary MD for the patient is Kyle Honour, MD  LOS - 6  days  Chief Complaint  Patient presents with   Back Pain       Brief summary   Patient is a 76 year old male with hypertension, obesity, COPD, OSA, CAD, AAA repair, TAVR, hyperlipidemia, tobacco use, chronic back pain presented to ED with worsening back pain despite use of Ultram and prednisone given to the patient on 6/23.  Patient also reported cough with chills and wheezing.  Patient was hypoxic in ED to 83% on room air.  Patient was placed on 4 L O2 via [place] and sats improved to 94%.  Patient reported symptoms for 2 weeks and worse in the last 1 week. Chest x-ray showed mild bibasilar atelectasis and/or pneumonia.  In ED, patient was noted to have increasing O2 needs and desatted to 70%.  He was transitioned to NRB.  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 -Blood culture sensitivities returned back as Gemella sanguinis, concerning for endocarditis in the setting of prior TAVR  -ID consulted, appreciate recommendations starting penicillin and gentamicin for suspected TAVR endocarditis -Cardiology consulted for TEE, scheduled on 12/08/2021 at 10:30 AM -MRI thoracic spine with no discitis or osteomyelitis, MRI lumbar spine pending -CT maxillofacial showed no dental abscess -2D echo pending    Accelerated hypertension -Improving, continue Norvasc, metoprolol -2D echo in 07/2021 had shown EF of 60 to 65%, G1  DD    COPD with acute exacerbation ([place]) -Wheezing much improved continue scheduled DuoNebs, Pulmicort, Brovana,  -Oral prednisone for 3 days   OSA (obstructive sleep apnea) -Continue CPAP    Tobacco use -Counseled smoking cessation    Hyperlipidemia LDL goal <70 -Continue statin  AAA repair -CTA noted no acute dissection or acute aortic process.  A tiny endoleak in the anterior endosac would be difficult to exclude, smaller than on previous imaging, potentially representing calcification, recommended follow-up -Explained above to the patient and will follow-up with cardiology outpatient.  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   Weight as of this encounter: 98.8 kg.  Code Status: Full code DVT Prophylaxis:  heparin injection 5,000 Units Start: 11/30/21 1630   Level of Care: Level of care: Progressive Family Communication: Updated patient's wife in detail on the phone on 7/3  Plan for disposition to SNF when medically ready   Disposition Plan:      Remains inpatient appropriate: Suspected Endocarditis, on IV antibiotics  Procedures:  Consultants:   Infectious disease  Antimicrobials:   Anti-infectives (From admission, onward)    Start     Dose/Rate Route Frequency Ordered Stop   12/05/21 1800  gentamicin (GARAMYCIN) 240 mg in dextrose 5 % 50 mL IVPB        3 mg/kg  80.6 kg (Adjusted) 112 mL/hr over 30 Minutes Intravenous Every 24 hours 12/05/21 1317     12/05/21 1400  penicillin G potassium 12 Million Units in dextrose 5 % 500 mL continuous infusion        12 Million Units 41.7 mL/hr over 12 Hours Intravenous Every 12 hours 12/05/21 1305     12/02/21 1800  vancomycin (VANCOREADY) IVPB 1250 mg/250 mL  Status:  Discontinued        1,250 mg 166.7 mL/hr over 90 Minutes Intravenous Every 24  hours 12/01/21 1610 12/05/21 1305   12/01/21 1800  azithromycin (ZITHROMAX) tablet 500 mg        500 mg Oral Every evening 12/01/21 0832 12/04/21 1714   12/01/21 1700  vancomycin (VANCOREADY) IVPB 1750 mg/350 mL        1,750 mg 175 mL/hr over 120 Minutes Intravenous  Once 12/01/21 1610 12/01/21 2045   11/30/21 1630  Ampicillin-Sulbactam (UNASYN) 3 g in sodium chloride 0.9 % 100 mL IVPB  Status:  Discontinued        3 g 200 mL/hr over 30 Minutes Intravenous Every 6 hours 11/30/21 1621 12/05/21 1305   11/30/21 1630  azithromycin (ZITHROMAX) 500 mg in sodium chloride 0.9 % 250 mL IVPB  Status:  Discontinued        500 mg 250 mL/hr over 60 Minutes Intravenous Every 24 hours 11/30/21 1622 12/01/21 0832   11/30/21 0930  Ampicillin-Sulbactam (UNASYN) 3 g in sodium chloride 0.9 % 100 mL IVPB        3 g 200 mL/hr over 30 Minutes Intravenous  Once 11/30/21 0923 11/30/21 1120          Medications  amLODipine  10 mg Oral Daily   arformoterol  15 mcg Nebulization BID   atorvastatin  40 mg Oral Daily   budesonide (PULMICORT) nebulizer solution  0.25 mg Nebulization BID   finasteride  5 mg Oral Daily   gabapentin  300 mg Oral BID   guaiFENesin  400 mg Oral BID   heparin  5,000 Units Subcutaneous Q8H   metoprolol tartrate  25 mg Oral BID   nicotine  21 mg Transdermal Daily   sertraline  50 mg Oral Daily   tamsulosin  0.4 mg Oral QHS      Subjective:   Council Kyle Lewis was seen and examined today.  No complaints, wheezing has improved.  No chest pain or acute shortness of breath.  Still has low back pain.  4-5/10.   Objective:   Vitals:   12/06/21 0439 12/06/21 0811 12/06/21 0833 12/06/21 1120  BP: (!) 166/60  133/61 (!) 156/73  Pulse: 60  (!) 53 67  Resp: '15  16 17  '$ Temp: 97.8 F (36.6 C)  97.9 F (36.6 C) 98.2 F (36.8 C)  TempSrc: Axillary  Oral Oral  SpO2:  95% 99% 97%  Weight:      Height:        Intake/Output Summary (Last 24 hours) at 12/06/2021 1500 Last data filed at  12/06/2021 1121 Gross per 24 hour  Intake 994.13 ml  Output 2751 ml  Net -1756.87 ml     Wt Readings from Last 3 Encounters:  12/05/21 98.8  kg  11/25/21 97.8 kg  11/09/21 97.8 kg    Physical Exam General: Alert and oriented x 3, NAD Cardiovascular: S1 S2 clear, RRR. Respiratory: CTAB, no wheezing Gastrointestinal: Soft, nontender, nondistended, NBS Ext: no pedal edema bilaterally Neuro: no new deficits   Data Reviewed:  I have personally reviewed following labs    CBC Lab Results  Component Value Date   WBC 12.2 (H) 12/01/2021   RBC 3.85 (L) 12/01/2021   HGB 12.4 (L) 12/01/2021   HCT 36.7 (L) 12/01/2021   MCV 95.3 12/01/2021   MCH 32.2 12/01/2021   PLT 172 12/01/2021   MCHC 33.8 12/01/2021   RDW 14.4 12/01/2021   LYMPHSABS 0.8 11/30/2021   MONOABS 0.9 11/30/2021   EOSABS 0.0 11/30/2021   BASOSABS 0.0 26/33/3545     Last metabolic panel Lab Results  Component Value Date   NA 136 12/05/2021   K 4.4 12/05/2021   CL 97 (L) 12/05/2021   CO2 34 (H) 12/05/2021   BUN 21 12/05/2021   CREATININE 0.89 12/06/2021   GLUCOSE 100 (H) 12/05/2021   GFRNONAA >60 12/06/2021   GFRAA 107 04/08/2020   CALCIUM 8.2 (L) 12/05/2021   PROT 5.8 (L) 12/01/2021   ALBUMIN 2.4 (L) 12/01/2021   LABGLOB 2.7 09/25/2019   AGRATIO 1.4 09/25/2019   BILITOT 0.8 12/01/2021   ALKPHOS 68 12/01/2021   AST 15 12/01/2021   ALT 18 12/01/2021   ANIONGAP 5 12/05/2021    CBG (last 3)  Recent Labs    12/04/21 0819 12/06/21 1118  GLUCAP 136* 164*        Mikaela Hilgeman M.D. [facility] 12/06/2021, 3:00 PM  Available via Epic secure chat 7am-7pm After 7 pm, please refer to night coverage provider listed on amion.

## 2021-12-06 NOTE — TOC Progression Note (Signed)
Transition of Care [facility]) - Progression Note    Patient Details  Name: Kyle Lewis MRN: [id number] Date of Birth: 12/09/1945  Transition of Care [facility]) CM/SW [place], [place] Phone Number: 12/06/2021, 12:48 PM  Clinical Narrative:     Patient has SN[facility] bed at [facility] when medically ready for dc. CSW will continue to follow and assist with patients dc planning needs.  Expected Discharge Plan: [place] Barriers to Discharge: Continued Medical Work up  Expected Discharge Plan and Services Expected Discharge Plan: [place] In-house Referral: Clinical Social Work     Living arrangements for the past 2 months: Single Family Home                                       Social Determinants of Health (SDOH) Interventions    Readmission Risk Interventions    06/10/2021   10:04 AM  Readmission Risk Prevention Plan  Transportation Screening Complete  PCP or Specialist Appt within 5-7 Days Complete  Home Care Screening Complete  Medication Review (RN CM) Complete

## 2021-12-06 NOTE — Progress Notes (Signed)
Id brief note:   Cc: presumed TAVR endocarditis, nutrition-variant organism Gemella   Mri spine no evidence soft tissue/bone-joint infection Panorex not yet done -- there is a ct maxillo facial ordered Tee also pending  A/p  Continue pcn/AG F/u ct maxillofacial and tee

## 2021-12-07 ENCOUNTER — Inpatient Hospital Stay (HOSPITAL_COMMUNITY): Payer: Medicare Other

## 2021-12-07 DIAGNOSIS — R7881 Bacteremia: Secondary | ICD-10-CM

## 2021-12-07 DIAGNOSIS — T826XXD Infection and inflammatory reaction due to cardiac valve prosthesis, subsequent encounter: Secondary | ICD-10-CM

## 2021-12-07 DIAGNOSIS — T826XXA Infection and inflammatory reaction due to cardiac valve prosthesis, initial encounter: Secondary | ICD-10-CM

## 2021-12-07 DIAGNOSIS — I330 Acute and subacute infective endocarditis: Secondary | ICD-10-CM

## 2021-12-07 DIAGNOSIS — Z952 Presence of prosthetic heart valve: Secondary | ICD-10-CM

## 2021-12-07 DIAGNOSIS — R21 Rash and other nonspecific skin eruption: Secondary | ICD-10-CM

## 2021-12-07 DIAGNOSIS — J441 Chronic obstructive pulmonary disease with (acute) exacerbation: Secondary | ICD-10-CM | POA: Diagnosis not present

## 2021-12-07 DIAGNOSIS — I38 Endocarditis, valve unspecified: Secondary | ICD-10-CM

## 2021-12-07 HISTORY — DX: Infection and inflammatory reaction due to cardiac valve prosthesis, initial encounter: T82.6XXA

## 2021-12-07 HISTORY — DX: Acute and subacute infective endocarditis: I33.0

## 2021-12-07 LAB — BASIC METABOLIC PANEL
Anion gap: 5 (ref 5–15)
BUN: 16 mg/dL (ref 8–23)
CO2: 36 mmol/L — ABNORMAL HIGH (ref 22–32)
Calcium: 8.7 mg/dL — ABNORMAL LOW (ref 8.9–10.3)
Chloride: 96 mmol/L — ABNORMAL LOW (ref 98–111)
Creatinine, Ser: 0.73 mg/dL (ref 0.61–1.24)
GFR, Estimated: >60 mL/min (ref >60)
Glucose, Bld: 125 mg/dL — ABNORMAL HIGH (ref 70–99)
Potassium: 4.5 mmol/L (ref 3.5–5.1)
Sodium: 137 mmol/L (ref 135–145)

## 2021-12-07 LAB — GLUCOSE, CAPILLARY: Glucose-Capillary: 121 mg/dL — ABNORMAL HIGH (ref 70–99)

## 2021-12-07 LAB — PROTIME-INR
INR: 1 (ref 0.8–1.2)
Prothrombin Time: 13.4 seconds (ref 11.4–15.2)

## 2021-12-07 LAB — CBC
HCT: 40.3 % (ref 39.0–52.0)
Hemoglobin: 13.2 g/dL (ref 13.0–17.0)
MCH: 31 pg (ref 26.0–34.0)
MCHC: 32.8 g/dL (ref 30.0–36.0)
MCV: 94.6 fL (ref 80.0–100.0)
Platelets: 212 10*3/uL (ref 150–400)
RBC: 4.26 MIL/uL (ref 4.22–5.81)
RDW: 13.8 % (ref 11.5–15.5)
WBC: 10.6 10*3/uL — ABNORMAL HIGH (ref 4.0–10.5)
nRBC: 0 % (ref 0.0–0.2)

## 2021-12-07 LAB — MAGNESIUM: Magnesium: 2.3 mg/dL (ref 1.7–2.4)

## 2021-12-07 MED ORDER — SODIUM CHLORIDE 0.9 % IV SOLN
INTRAVENOUS | Status: DC
  Administered 2021-12-08: 20 mL/h via INTRAVENOUS

## 2021-12-07 MED ORDER — SODIUM CHLORIDE 0.9 % IV SOLN: INTRAVENOUS | Status: DC

## 2021-12-07 MED ORDER — CLONAZEPAM 0.5 MG PO TABS: 0.2500 mg | ORAL_TABLET | Freq: Two times a day (BID) | ORAL | Status: DC | PRN

## 2021-12-07 MED ORDER — CLONAZEPAM 0.25 MG PO TBDP
0.2500 mg | ORAL_TABLET | Freq: Two times a day (BID) | ORAL | Status: DC | PRN
  Administered 2021-12-07: 0.25 mg via ORAL
  Filled 2021-12-07: qty 1

## 2021-12-07 NOTE — Progress Notes (Signed)
Physical Therapy Treatment Patient Details Name: Kyle Lewis MRN: [id number] DOB: 1945/10/08 Today's Date: 12/07/2021   History of Present Illness 76 y.o. male presents to [facility] hospital on 11/30/2021 with increase in back pain as well as cough and chills. Pt found to have CAP with associated respiratory failure. PMH includes HTN, COPD, OSA, CAD, AAA, chronic back pain.    PT Comments    Pt continues to be limited in therapy by LBP. Pt tolerates ambulation well today, with more upright posture and less cardiopulmonary symptoms on 3L O2 (same as rest). Pt educated that continued therapy and mobilization should assist the Pt with pain modulation, so that gait, activity tolerance, and strength can be progressed. Pt informs PT that he and his wife are still discussing SNF placement, as his PT impairments require more physical assistance than can be provided in his home.  Recommendations for follow up therapy are one component of a multi-disciplinary discharge planning process, led by the attending physician.  Recommendations may be updated based on patient status, additional functional criteria and insurance authorization.  Follow Up Recommendations  Skilled nursing-short term rehab (<3 hours/day) Can patient physically be transported by private vehicle: Yes   Assistance Recommended at Discharge Intermittent Supervision/Assistance  Patient can return home with the following A little help with walking and/or transfers;A little help with bathing/dressing/bathroom;Assistance with cooking/housework;Direct supervision/assist for medications management;Direct supervision/assist for financial management;Assist for transportation;Help with stairs or ramp for entrance   Equipment Recommendations  None recommended by PT    Recommendations for Other Services       Precautions / Restrictions Precautions Precautions: Fall Restrictions Weight Bearing Restrictions: No     Mobility  Bed  Mobility Overal bed mobility: Needs Assistance Bed Mobility: Supine to Sit Rolling: Min assist         General bed mobility comments: HHA for supine to sit    Transfers Overall transfer level: Needs assistance Equipment used: Rolling walker (2 wheels) Transfers: Sit to/from Stand Sit to Stand: Min guard, From elevated surface           General transfer comment: Verbal cues for hand placement    Ambulation/Gait Ambulation/Gait assistance: Min guard Gait Distance (Feet): 70 Feet (Additional trials of 15' and 20') Assistive device: Rolling walker (2 wheels) Gait Pattern/deviations: Step-through pattern, Decreased step length - right, Decreased step length - left, Decreased stride length, Trunk flexed (Less significant trunk flexion during this session) Gait velocity: decreased Gait velocity interpretation: <1.8 ft/sec, indicate of risk for recurrent falls   General Gait Details: Pt requires chair follow during ambulation for intermittent seated rest breaks   Stairs             Wheelchair Mobility    Modified Rankin (Stroke Patients Only)       Balance Overall balance assessment: Needs assistance Sitting-balance support: No upper extremity supported, Feet supported Sitting balance-Leahy Scale: Fair     Standing balance support: Bilateral upper extremity supported, Reliant on assistive device for balance Standing balance-Leahy Scale: Poor                              Cognition Arousal/Alertness: Awake/alert Behavior During Therapy: WFL for tasks assessed/performed Overall Cognitive Status: Within Functional Limits for tasks assessed  Exercises      General Comments General comments (skin integrity, edema, etc.): Unable to monitor O2 during session due to tech issues; Pt symptoms remain stable on 3L O2 at rest and during ambulation      Pertinent Vitals/Pain Pain Assessment Pain  Assessment: 0-10 Pain Score: 9  Pain Location: Low back, pelvic girdle Pain Descriptors / Indicators: Grimacing Pain Intervention(s): Monitored during session    Home Living                          Prior Function            PT Goals (current goals can now be found in the care plan section) Acute Rehab PT Goals Patient Stated Goal: Return home PT Goal Formulation: With patient Time For Goal Achievement: 12/17/21 Potential to Achieve Goals: Fair Progress towards PT goals: Progressing toward goals    Frequency    Min 2X/week      PT Plan Current plan remains appropriate    Co-evaluation              AM-PAC PT "6 Clicks" Mobility   Outcome Measure  Help needed turning from your back to your side while in a flat bed without using bedrails?: A Little Help needed moving from lying on your back to sitting on the side of a flat bed without using bedrails?: A Little Help needed moving to and from a bed to a chair (including a wheelchair)?: A Little Help needed standing up from a chair using your arms (e.g., wheelchair or bedside chair)?: A Little Help needed to walk in hospital room?: A Little Help needed climbing 3-5 steps with a railing? : A Lot 6 Click Score: 17    End of Session Equipment Utilized During Treatment: Gait belt;Oxygen Activity Tolerance: Patient limited by pain Patient left: in chair;with chair alarm set;with call bell/phone within reach Nurse Communication: Mobility status PT Visit Diagnosis: Other abnormalities of gait and mobility (R26.89);Muscle weakness (generalized) (M62.81)     Time: 0370-4888 PT Time Calculation (min) (ACUTE ONLY): 38 min  Charges:  $Gait Training: 23-37 mins $Therapeutic Activity: 8-22 mins                     Hall Busing, SPT Acute Rehabilitation Office #: [phone]    Hall Busing 12/07/2021, 4:11 PM

## 2021-12-07 NOTE — Progress Notes (Addendum)
[facility]                                                                              Kyle Lewis, is a 76 y.o. 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  Patient also reported cough with chills and wheezing.  Patient was hypoxic in ED to 83% on room air.  Patient was placed on 4 L O2 via [place] and sats improved to 94%.  Patient reported symptoms for 2 weeks and worse in the last 1 week. Chest x-ray showed mild bibasilar atelectasis and/or pneumonia.  In ED, patient was noted to have increasing O2 needs and desatted to 70%.  He was transitioned to NRB.  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 -Blood culture sensitivities returned back as Gemella sanguinis, concerning for endocarditis in the setting of prior TAVR  -ID consulted, appreciate recommendations starting penicillin and gentamicin for suspected TAVR endocarditis -echo is concerning for "shaggy mobile echodensity on the ventricular surface of the  TAVR valve concerning for vegetation" Cardiology consulted for TEE, scheduled on 12/08/2021 at 10:30 AM -MRI thoracic spine with no discitis or osteomyelitis, MRI lumbar spine concerning for epidural  abscess/discitis osteomyelitis, neurosurgery consult requested  ( Dr Luiz Iron answering service, awaiting for call back -CT maxillofacial showed no dental abscess -left shoulder pain with limited range of motion for 3wks, will get left shoulder mri w /wo contrast  -appreciate ID input, will follow recommendation  Addendum: Dr Sherley Bounds called back from neurosurgery, he will see patient in consult.    Accelerated hypertension -Improving, continue Norvasc, metoprolol -2D echo in 07/2021 had shown EF of 60 to 65%, G1 DD    COPD with acute exacerbation (HCC) -Wheezing much improved continue scheduled DuoNebs, Pulmicort, Brovana,  -Oral prednisone for 3 days   OSA (obstructive sleep apnea) -Continue CPAP    Tobacco use -Counseled smoking cessation    Hyperlipidemia LDL goal <70 -Continue statin  AAA repair -CTA noted no acute dissection or acute aortic process.  A tiny endoleak in the anterior endosac would be difficult to exclude, smaller than on previous imaging, potentially representing calcification, recommended follow-up -Explained above to the patient and will follow-up with cardiology outpatient.  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   Weight as of this encounter: 96.5 kg.  OSA on reports use bipap at night at home, reports use home o2 prn at home  Reports baseline imbalance, use walker/cane  Code Status: Full code DVT Prophylaxis:  heparin injection 5,000 Units Start: 11/30/21 1630   Level of Care: Level of care: Progressive Family Communication: Updated patient's wife in detail on the phone on 7/3  Plan for disposition to SNF when medically ready   Disposition Plan:      Remains inpatient appropriate: Suspected Endocarditis, on IV antibiotics  Procedures:    Consultants:    Infectious disease Cardiology neurosurgery  Antimicrobials:   Anti-infectives (From admission, onward)    Start     Dose/Rate Route Frequency Ordered Stop   12/05/21 1800  gentamicin (GARAMYCIN) 240 mg in dextrose 5 % 50 mL IVPB        3 mg/kg  80.6 kg (Adjusted) 112 mL/hr over 30 Minutes Intravenous Every 24 hours 12/05/21 1317     12/05/21 1400  penicillin G potassium 12 Million Units in dextrose 5 % 500 mL continuous infusion        12 Million Units 41.7 mL/hr over 12 Hours Intravenous Every 12 hours 12/05/21 1305     12/02/21 1800  vancomycin (VANCOREADY) IVPB 1250 mg/250 mL  Status:  Discontinued        1,250 mg 166.7 mL/hr over 90 Minutes Intravenous Every 24 hours 12/01/21 1610 12/05/21 1305   12/01/21 1800  azithromycin (ZITHROMAX) tablet 500 mg        500 mg Oral Every evening 12/01/21 0832 12/04/21 1714   12/01/21 1700  vancomycin (VANCOREADY) IVPB 1750 mg/350 mL        1,750 mg 175 mL/hr over 120 Minutes Intravenous  Once 12/01/21 1610 12/01/21 2045   11/30/21 1630  Ampicillin-Sulbactam (UNASYN) 3 g in sodium chloride 0.9 % 100 mL IVPB  Status:  Discontinued        3 g 200 mL/hr over 30 Minutes Intravenous Every 6 hours 11/30/21 1621 12/05/21 1305   11/30/21 1630  azithromycin (ZITHROMAX) 500 mg in sodium chloride 0.9 % 250 mL IVPB  Status:  Discontinued        500 mg 250 mL/hr over 60 Minutes Intravenous Every 24 hours 11/30/21 1622 12/01/21 0832   11/30/21 0930  Ampicillin-Sulbactam (UNASYN) 3 g in sodium chloride 0.9 % 100 mL IVPB        3 g 200 mL/hr over 30 Minutes Intravenous  Once 11/30/21 0923 11/30/21 1120          Medications  amLODipine  10 mg Oral Daily   arformoterol  15 mcg Nebulization BID   atorvastatin  40 mg Oral Daily   budesonide (PULMICORT) nebulizer solution  0.25 mg Nebulization BID   finasteride  5 mg Oral Daily   gabapentin  300 mg Oral BID   guaiFENesin  400 mg Oral BID   heparin  5,000 Units Subcutaneous Q8H   metoprolol  tartrate  25 mg Oral BID   nicotine  21 mg Transdermal Daily   sertraline  50 mg Oral Daily   tamsulosin  0.4 mg Oral QHS      Subjective:   Kyle Lewis was seen and examined today.   He reports low back pain for 5 months and left shoulder pain for three weeks Currently on 3liter oxygen, denies sob, no chest pain, reports chronic cough at baseline, no  edema, no fever  Objective:   Vitals:   12/06/21 2117 12/06/21 2308 12/07/21 0626 12/07/21 0848  BP: (!) 141/81  (!) 102/55 127/68  Pulse: 65 62 80 (!) 59  Resp: 16 16 (!) 21   Temp:   98.1 F (36.7 C)   TempSrc:   Oral   SpO2: 95% 97% 99%   Weight:   96.5 kg   Height:        Intake/Output Summary (Last 24 hours) at 12/07/2021 0858 Last data filed at 12/07/2021 0648 Gross per 24 hour  Intake --  Output 3750 ml  Net -3750 ml     Wt Readings from Last 3 Encounters:  12/07/21 96.5 kg  11/25/21 97.8 kg  11/09/21 97.8 kg    Physical Exam General: Alert and oriented x 3, NAD, slightly impaired hearing  Cardiovascular: S1 S2 clear, RRR. Respiratory: CTAB, no wheezing Gastrointestinal: Soft, nontender, nondistended, NBS Ext: no pedal edema bilaterally, scatter ecchymosis on arms/legs, left shoulder range of motion limited due to pain Neuro: no new deficits   Data Reviewed:  I have personally reviewed following labs    CBC Lab Results  Component Value Date   WBC 12.2 (H) 12/01/2021   RBC 3.85 (L) 12/01/2021   HGB 12.4 (L) 12/01/2021   HCT 36.7 (L) 12/01/2021   MCV 95.3 12/01/2021   MCH 32.2 12/01/2021   PLT 172 12/01/2021   MCHC 33.8 12/01/2021   RDW 14.4 12/01/2021   LYMPHSABS 0.8 11/30/2021   MONOABS 0.9 11/30/2021   EOSABS 0.0 11/30/2021   BASOSABS 0.0 50/56/9794     Last metabolic panel Lab Results  Component Value Date   NA 136 12/05/2021   K 4.4 12/05/2021   CL 97 (L) 12/05/2021   CO2 34 (H) 12/05/2021   BUN 21 12/05/2021   CREATININE 0.89 12/06/2021   GLUCOSE 100 (H) 12/05/2021   GFRNONAA  >60 12/06/2021   GFRAA 107 04/08/2020   CALCIUM 8.2 (L) 12/05/2021   PROT 5.8 (L) 12/01/2021   ALBUMIN 2.4 (L) 12/01/2021   LABGLOB 2.7 09/25/2019   AGRATIO 1.4 09/25/2019   BILITOT 0.8 12/01/2021   ALKPHOS 68 12/01/2021   AST 15 12/01/2021   ALT 18 12/01/2021   ANIONGAP 5 12/05/2021    CBG (last 3)  Recent Labs    12/06/21 1118 12/06/21 1612 12/07/21 0644  GLUCAP 164* 206* 121*        Kyle Lewis M.D. PhD FACP [facility] 12/07/2021, 8:58 AM  Available via Epic secure chat 7am-7pm After 7 pm, please refer to night coverage provider listed on amion.

## 2021-12-07 NOTE — Progress Notes (Signed)
[facility] for Infectious Disease  Date of Admission:  11/30/2021           Reason for visit: Follow up on PVE  Current antibiotics: PCN Gent   ASSESSMENT:    76 y.o. male admitted with:  Gemella sanguinis bacteremia: Patient with positive blood cultures in 4 of 4 bottles from 11/30/21 and micro lab is growing Gemella sanguinis.  Susceptibility testing has been sent out to [facility].  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  Suspect an odontogenic source given poor dentition.  Repeat blood cultures are NGTD and TEE planned for tomorrow. History of AS s/p TAVR: Completed in January 2023. History of AAA: Status post endovascular repair in January 2019. COPD Exacerbation:  Improved at this time and stable at 2-3 liters.  He reports that his symptoms are improved.  Chronic back pain: History of spinal fusion from L2-5 and initial complaint on admission was lumbar and thoracic pain.  Thoracic MRI was unremarkable however patient was not able to tolerate procedure to complete lumbar imaging. Rash: Does not appear c/w drug eruption.  ? Heat rash from his hospital gown as appears isolated to upper chest area.  RECOMMENDATIONS:    Continue PCN and gentamicin TEE planned for tomorrow Attempt to get lumbar MRI done Follow up repeat cultures Await susceptibility testing sent out to [facility] monitoring Will follow   Principal Problem:   Acute respiratory failure with hypoxia (HCC) Active Problems:   OSA (obstructive sleep apnea)   Tobacco use   Hyperlipidemia LDL goal <70   S/P TAVR (transcatheter aortic valve replacement)   CAP (community acquired pneumonia)   Accelerated hypertension   COPD with acute exacerbation (HCC)    MEDICATIONS:    Scheduled Meds:  amLODipine  10 mg Oral Daily   arformoterol  15 mcg Nebulization BID   atorvastatin  40 mg Oral Daily   budesonide  (PULMICORT) nebulizer solution  0.25 mg Nebulization BID   finasteride  5 mg Oral Daily   gabapentin  300 mg Oral BID   guaiFENesin  400 mg Oral BID   heparin  5,000 Units Subcutaneous Q8H   metoprolol tartrate  25 mg Oral BID   nicotine  21 mg Transdermal Daily   sertraline  50 mg Oral Daily   tamsulosin  0.4 mg Oral QHS   Continuous Infusions:  sodium chloride 10 mL/hr at 12/05/21 2316   gentamicin 240 mg (12/06/21 1753)   penicillin G potassium 12 Million Units in dextrose 5 % 500 mL continuous infusion 12 Million Units (12/07/21 0449)   PRN Meds:.sodium chloride, acetaminophen, albuterol, hydrALAZINE, ondansetron **OR** ondansetron (ZOFRAN) IV, traMADol  SUBJECTIVE:   24 hour events:  TEE planned tmrw TTE shows vegetation  MRI lumbar pending MRI thoracic negative CT maxillofacial with dental caries but no abscess Repeat blood cx pending  Patient reports doing okay this morning.  Back pain is still present but a little better.  He has a rash across his chest.  Review of Systems  All other systems reviewed and are negative.     OBJECTIVE:   Blood pressure (!) 102/55, pulse 80, temperature 98.1 F (36.7 C), temperature source Oral, resp. rate (!) 21, height '5\' 8"'$  (1.727 m), weight 96.5 kg, SpO2 99 %. Body mass index is 32.35 kg/m.  Physical Exam Constitutional:      Comments: Chronically ill appearing man, lying in  bed, NAD.   HENT:     Head: Normocephalic and atraumatic.  Eyes:     Extraocular Movements: Extraocular movements intact.     Conjunctiva/sclera: Conjunctivae normal.  Pulmonary:     Effort: Pulmonary effort is normal. No respiratory distress.  Abdominal:     General: There is no distension.     Palpations: Abdomen is soft.  Musculoskeletal:        General: Normal range of motion.     Cervical back: Normal range of motion and neck supple.  Skin:    General: Skin is warm and dry.     Findings: Rash present.  Neurological:     General: No focal  deficit present.     Mental Status: He is oriented to person, place, and time.  Psychiatric:        Mood and Affect: Mood normal.        Behavior: Behavior normal.      Lab Results: Lab Results  Component Value Date   WBC 12.2 (H) 12/01/2021   HGB 12.4 (L) 12/01/2021   HCT 36.7 (L) 12/01/2021   MCV 95.3 12/01/2021   PLT 172 12/01/2021    Lab Results  Component Value Date   NA 136 12/05/2021   K 4.4 12/05/2021   CO2 34 (H) 12/05/2021   GLUCOSE 100 (H) 12/05/2021   BUN 21 12/05/2021   CREATININE 0.89 12/06/2021   CALCIUM 8.2 (L) 12/05/2021   GFRNONAA >60 12/06/2021   GFRAA 107 04/08/2020    Lab Results  Component Value Date   ALT 18 12/01/2021   AST 15 12/01/2021   ALKPHOS 68 12/01/2021   BILITOT 0.8 12/01/2021    No results found for: "CRP"     Component Value Date/Time   ESRSEDRATE 8 09/25/2019 1110     I have reviewed the micro and lab results in Epic.  Imaging: ECHOCARDIOGRAM COMPLETE  Result Date: 12/06/2021    ECHOCARDIOGRAM REPORT   Patient Name:   Kyle Lewis Date of Exam: 12/06/2021 Medical Rec #:  [id number]       Height:       68.0 in Accession #:    [id number]      Weight:       217.8 lb Date of Birth:  1946/05/16      BSA:          2.119 m Patient Age:    49 years        BP:           156/73 mmHg Patient Gender: M               HR:           61 bpm. Exam Location:  Inpatient Procedure: 2D Echo, Cardiac Doppler and Color Doppler Indications:    Endocarditis  History:        Patient has prior history of Echocardiogram examinations, most                 recent 07/15/2021. CAD, Stroke; Risk Factors:Hypertension.                 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 Procedure Date: 06-07-21.  Sonographer:    Merrie Roof RDCS Referring Phys: RAI, RIPUDEEP, K IMPRESSIONS  1. TAVR valve vegetation - has TEE scheduled for furher clarification.  2.  Left ventricular ejection fraction, by  estimation, is 60 to 65%. The left ventricle has normal function. The left ventricle has no regional wall motion abnormalities. There is moderate left ventricular hypertrophy. Left ventricular diastolic parameters are consistent with Grade I diastolic dysfunction (impaired relaxation).  3. Right ventricular systolic function is normal. The right ventricular size is mildly enlarged.  4. Right atrial size was mildly dilated.  5. The mitral valve is normal in structure. No evidence of mitral valve regurgitation. No evidence of mitral stenosis.  6. There is a shaggy mobile echodensity on the ventricular surface of the TAVR valve concerning for vegetation. Has TEE scheduled on 12/08/21 for clarification. The aortic valve has been repaired/replaced. Aortic valve regurgitation is not visualized. No aortic stenosis is present. 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 Procedure Date: 06-07-21. Echo findings are consistent with vegetation of the aortic prosthesis. Aortic valve mean gradient measures 10.0 mmHg. Aortic valve Vmax measures 2.26 m/s.  7. The inferior vena cava is normal in size with greater than 50% respiratory variability, suggesting right atrial pressure of 3 mmHg. Conclusion(s)/Recommendation(s): TAVR valve vegetation - has TEE scheduled for furher clarification. FINDINGS  Left Ventricle: Left ventricular ejection fraction, by estimation, is 60 to 65%. The left ventricle has normal function. The left ventricle has no regional wall motion abnormalities. The left ventricular internal cavity size was normal in size. There is  moderate left ventricular hypertrophy. Left ventricular diastolic parameters are consistent with Grade I diastolic dysfunction (impaired relaxation). Right Ventricle: The right ventricular size is mildly enlarged. No increase in right ventricular wall thickness. Right ventricular systolic function is normal. Left Atrium: Left atrial size was normal in  size. Right Atrium: Right atrial size was mildly dilated. Pericardium: There is no evidence of pericardial effusion. Mitral Valve: The mitral valve is normal in structure. No evidence of mitral valve regurgitation. No evidence of mitral valve stenosis. Tricuspid Valve: The tricuspid valve is normal in structure. Tricuspid valve regurgitation is not demonstrated. No evidence of tricuspid stenosis. Aortic Valve: There is a shaggy mobile echodensity on the ventricular surface of the TAVR valve concerning for vegetation. Has TEE scheduled on 12/08/21 for clarification. The aortic valve has been repaired/replaced. Aortic valve regurgitation is not visualized. No aortic stenosis is present. Aortic valve mean gradient measures 10.0 mmHg. Aortic valve peak gradient measures 20.4 mmHg. Aortic valve area, by VTI measures 1.08 cm. 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 Procedure Date: 06-07-21. Pulmonic Valve: The pulmonic valve was normal in structure. Pulmonic valve regurgitation is not visualized. No evidence of pulmonic stenosis. Aorta: The aortic root is normal in size and structure. Venous: The inferior vena cava is normal in size with greater than 50% respiratory variability, suggesting right atrial pressure of 3 mmHg. IAS/Shunts: No atrial level shunt detected by color flow Doppler.  LEFT VENTRICLE PLAX 2D LVIDd:         5.10 cm   Diastology LVIDs:         3.30 cm   LV e' medial:    5.98 cm/s LV PW:         1.50 cm   LV E/e' medial:  13.7 LV IVS:        1.30 cm   LV e' lateral:   4.13 cm/s LVOT diam:     2.20 cm   LV E/e' lateral: 19.9 LV SV:  57 LV SV Index:   27 LVOT Area:     3.80 cm  RIGHT VENTRICLE RV Basal diam:  4.40 cm TAPSE (M-mode): 2.2 cm LEFT ATRIUM           Index        RIGHT ATRIUM           Index LA diam:      3.30 cm 1.56 cm/m   RA Area:     26.40 cm LA Vol (A4C): 96.8 ml 45.68 ml/m  RA Volume:   87.50 ml  41.29 ml/m  AORTIC VALVE AV Area  (Vmax):    1.14 cm AV Area (Vmean):   1.15 cm AV Area (VTI):     1.08 cm AV Vmax:           226.00 cm/s AV Vmean:          145.000 cm/s AV VTI:            0.530 m AV Peak Grad:      20.4 mmHg AV Mean Grad:      10.0 mmHg LVOT Vmax:         67.50 cm/s LVOT Vmean:        43.900 cm/s LVOT VTI:          0.150 m LVOT/AV VTI ratio: 0.28  AORTA Ao Root diam: 3.60 cm MITRAL VALVE MV Area (PHT): 2.83 cm     SHUNTS MV Decel Time: 268 msec     Systemic VTI:  0.15 m MV E velocity: 82.10 cm/s   Systemic Diam: 2.20 cm MV A velocity: 114.00 cm/s MV E/A ratio:  0.72 Candee Furbish MD Electronically signed by Candee Furbish MD Signature Date/Time: 12/06/2021/5:48:51 PM    Final    CT MAXILLOFACIAL WO CONTRAST  Result Date: 12/06/2021 CLINICAL DATA:  Bacteremia, dental caries EXAM: CT MAXILLOFACIAL WITHOUT CONTRAST TECHNIQUE: Multidetector CT imaging of the maxillofacial structures was performed. Multiplanar CT image reconstructions were also generated. RADIATION DOSE REDUCTION: This exam was performed according to the departmental dose-optimization program which includes automated exposure control, adjustment of the mA and/or kV according to patient size and/or use of iterative reconstruction technique. COMPARISON:  CT head 03/07/2019.  CT head neck 04/30/2012 FINDINGS: Osseous: No acute maxillofacial bone fracture. Bony orbital walls are intact. Mandible intact. Temporomandibular joints are aligned without dislocation. Mild periapical lucency associated with the left first maxillary molar (series 8, image 64). No additional significant periapical lucencies. Evaluation for dental caries is limited by artifact related to dental hardware. Orbits: Negative. No traumatic or inflammatory finding. Sinuses: There are a few scattered areas of mucosal thickening in the ethmoid air cells bilaterally. Paranasal sinuses and mastoid air cells are otherwise clear. No air-fluid levels. Soft tissues: No soft tissue swelling or fluid collections. No  lymphadenopathy is evident. Limited intracranial: No significant or unexpected finding. IMPRESSION: 1. Mild periapical lucency associated with the left first maxillary molar. Evaluation for dental caries is limited by artifact related to dental hardware. No evidence of periapical abscess. 2. Minimal mucosal thickening within the ethmoid air cells. No evidence of odontogenic sinusitis. 3. No acute maxillofacial bone fracture. Electronically Signed   By: Davina Poke D.O.   On: 12/06/2021 14:01   MR THORACIC SPINE WO CONTRAST  Result Date: 12/05/2021 CLINICAL DATA:  Mid back pain EXAM: MRI THORACIC SPINE WITHOUT CONTRAST TECHNIQUE: Multiplanar, multisequence MR imaging of the thoracic spine was performed. No intravenous contrast was administered. COMPARISON:  None Available. FINDINGS: Alignment:  Physiologic.  Vertebrae: No fracture, evidence of discitis, or bone lesion. Cord:  Normal signal and morphology. Paraspinal and other soft tissues: Negative. Disc levels: There is no spinal canal or neural foraminal stenosis. IMPRESSION: No discitis-osteomyelitis or epidural collection. Electronically Signed   By: Ulyses Jarred M.D.   On: 12/05/2021 22:05     Imaging independently reviewed in Epic.    Raynelle Highland for Infectious Disease [facility] [phone] pager 12/07/2021, 8:20 AM

## 2021-12-07 NOTE — Progress Notes (Signed)
     Shared Decision Making/Informed Consent   The risks [esophageal damage, perforation (1:10,000 risk), bleeding, pharyngeal hematoma as well as other potential complications associated with conscious sedation including aspiration, arrhythmia, respiratory failure and death], benefits (treatment guidance and diagnostic support) and alternatives of a transesophageal echocardiogram were discussed in detail with Kyle Lewis and he is willing to proceed.    TEE scheduled for tomorrow 12/08/21, NPO after midnight. Will repeat labs today. Orders placed.

## 2021-12-07 NOTE — TOC Progression Note (Signed)
Transition of Care [facility]) - Progression Note    Patient Details  Name: Kyle Lewis MRN: [id number] Date of Birth: 09-17-1945  Transition of Care [facility]) CM/SW [place], [place] Phone Number: 12/07/2021, 11:14 AM  Clinical Narrative:     Patient has SNF bed at [facility] when medically ready for dc. CSW will continue to follow and assist with patients dc planning needs.  Expected Discharge Plan: [place] Barriers to Discharge: Continued Medical Work up  Expected Discharge Plan and Services Expected Discharge Plan: [place] In-house Referral: Clinical Social Work     Living arrangements for the past 2 months: Single Family Home                                       Social Determinants of Health (SDOH) Interventions    Readmission Risk Interventions    06/10/2021   10:04 AM  Readmission Risk Prevention Plan  Transportation Screening Complete  PCP or Specialist Appt within 5-7 Days Complete  Home Care Screening Complete  Medication Review (RN CM) Complete

## 2021-12-07 NOTE — H&P (View-Only) (Signed)
[facility]  Date of Admission:  11/30/2021           Reason for visit: Follow up on PVE  Current antibiotics: PCN Gent   ASSESSMENT:    76 y.o. male admitted with:  Gemella sanguinis bacteremia: Patient with positive blood cultures in 4 of 4 bottles from 11/30/21 and micro lab is growing Gemella sanguinis.  Susceptibility testing has been sent out to [facility].  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  Suspect an odontogenic source given poor dentition.  Repeat blood cultures are NGTD and TEE planned for tomorrow. History of AS s/p TAVR: Completed in January 2023. History of AAA: Status post endovascular repair in January 2019. COPD Exacerbation:  Improved at this time and stable at 2-3 liters.  He reports that his symptoms are improved.  Chronic back pain: History of spinal fusion from L2-5 and initial complaint on admission was lumbar and thoracic pain.  Thoracic MRI was unremarkable however patient was not able to tolerate procedure to complete lumbar imaging. Rash: Does not appear c/w drug eruption.  ? Heat rash from his hospital gown as appears isolated to upper chest area.  RECOMMENDATIONS:    Continue PCN and gentamicin TEE planned for tomorrow Attempt to get lumbar MRI done Follow up repeat cultures Await susceptibility testing sent out to [facility] monitoring Will follow   Principal Problem:   Acute respiratory failure with hypoxia (HCC) Active Problems:   OSA (obstructive sleep apnea)   Tobacco use   Hyperlipidemia LDL goal <70   S/P TAVR (transcatheter aortic valve replacement)   CAP (community acquired pneumonia)   Accelerated hypertension   COPD with acute exacerbation (HCC)    MEDICATIONS:    Scheduled Meds:  amLODipine  10 mg Oral Daily   arformoterol  15 mcg Nebulization BID   atorvastatin  40 mg Oral Daily   budesonide  (PULMICORT) nebulizer solution  0.25 mg Nebulization BID   finasteride  5 mg Oral Daily   gabapentin  300 mg Oral BID   guaiFENesin  400 mg Oral BID   heparin  5,000 Units Subcutaneous Q8H   metoprolol tartrate  25 mg Oral BID   nicotine  21 mg Transdermal Daily   sertraline  50 mg Oral Daily   tamsulosin  0.4 mg Oral QHS   Continuous Infusions:  sodium chloride 10 mL/hr at 12/05/21 2316   gentamicin 240 mg (12/06/21 1753)   penicillin G potassium 12 Million Units in dextrose 5 % 500 mL continuous infusion 12 Million Units (12/07/21 0449)   PRN Meds:.sodium chloride, acetaminophen, albuterol, hydrALAZINE, ondansetron **OR** ondansetron (ZOFRAN) IV, traMADol  SUBJECTIVE:   24 hour events:  TEE planned tmrw TTE shows vegetation  MRI lumbar pending MRI thoracic negative CT maxillofacial with dental caries but no abscess Repeat blood cx pending  Patient reports doing okay this morning.  Back pain is still present but a little better.  He has a rash across his chest.  Review of Systems  All other systems reviewed and are negative.     OBJECTIVE:   Blood pressure (!) 102/55, pulse 80, temperature 98.1 F (36.7 C), temperature source Oral, resp. rate (!) 21, height '5\' 8"'$  (1.727 m), weight 96.5 kg, SpO2 99 %. Body mass index is 32.35 kg/m.  Physical Exam Constitutional:      Comments: Chronically ill appearing man, lying in  bed, NAD.   HENT:     Head: Normocephalic and atraumatic.  Eyes:     Extraocular Movements: Extraocular movements intact.     Conjunctiva/sclera: Conjunctivae normal.  Pulmonary:     Effort: Pulmonary effort is normal. No respiratory distress.  Abdominal:     General: There is no distension.     Palpations: Abdomen is soft.  Musculoskeletal:        General: Normal range of motion.     Cervical back: Normal range of motion and neck supple.  Skin:    General: Skin is warm and dry.     Findings: Rash present.  Neurological:     General: No focal  deficit present.     Mental Status: He is oriented to person, place, and time.  Psychiatric:        Mood and Affect: Mood normal.        Behavior: Behavior normal.      Lab Results: Lab Results  Component Value Date   WBC 12.2 (H) 12/01/2021   HGB 12.4 (L) 12/01/2021   HCT 36.7 (L) 12/01/2021   MCV 95.3 12/01/2021   PLT 172 12/01/2021    Lab Results  Component Value Date   NA 136 12/05/2021   K 4.4 12/05/2021   CO2 34 (H) 12/05/2021   GLUCOSE 100 (H) 12/05/2021   BUN 21 12/05/2021   CREATININE 0.89 12/06/2021   CALCIUM 8.2 (L) 12/05/2021   GFRNONAA >60 12/06/2021   GFRAA 107 04/08/2020    Lab Results  Component Value Date   ALT 18 12/01/2021   AST 15 12/01/2021   ALKPHOS 68 12/01/2021   BILITOT 0.8 12/01/2021    No results found for: "CRP"     Component Value Date/Time   ESRSEDRATE 8 09/25/2019 1110     I have reviewed the micro and lab results in Epic.  Imaging: ECHOCARDIOGRAM COMPLETE  Result Date: 12/06/2021    ECHOCARDIOGRAM REPORT   Patient Name:   CAIDENCE HIGASHI Date of Exam: 12/06/2021 Medical Rec #:  [id number]       Height:       68.0 in Accession #:    [id number]      Weight:       217.8 lb Date of Birth:  06-Feb-1946      BSA:          2.119 m Patient Age:    34 years        BP:           156/73 mmHg Patient Gender: M               HR:           61 bpm. Exam Location:  Inpatient Procedure: 2D Echo, Cardiac Doppler and Color Doppler Indications:    Endocarditis  History:        Patient has prior history of Echocardiogram examinations, most                 recent 07/15/2021. CAD, Stroke; Risk Factors:Hypertension.                 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 Procedure Date: 06-07-21.  Sonographer:    Merrie Roof RDCS Referring Phys: RAI, RIPUDEEP, K IMPRESSIONS  1. TAVR valve vegetation - has TEE scheduled for furher clarification.  2.  Left ventricular ejection fraction, by  estimation, is 60 to 65%. The left ventricle has normal function. The left ventricle has no regional wall motion abnormalities. There is moderate left ventricular hypertrophy. Left ventricular diastolic parameters are consistent with Grade I diastolic dysfunction (impaired relaxation).  3. Right ventricular systolic function is normal. The right ventricular size is mildly enlarged.  4. Right atrial size was mildly dilated.  5. The mitral valve is normal in structure. No evidence of mitral valve regurgitation. No evidence of mitral stenosis.  6. There is a shaggy mobile echodensity on the ventricular surface of the TAVR valve concerning for vegetation. Has TEE scheduled on 12/08/21 for clarification. The aortic valve has been repaired/replaced. Aortic valve regurgitation is not visualized. No aortic stenosis is present. 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 Procedure Date: 06-07-21. Echo findings are consistent with vegetation of the aortic prosthesis. Aortic valve mean gradient measures 10.0 mmHg. Aortic valve Vmax measures 2.26 m/s.  7. The inferior vena cava is normal in size with greater than 50% respiratory variability, suggesting right atrial pressure of 3 mmHg. Conclusion(s)/Recommendation(s): TAVR valve vegetation - has TEE scheduled for furher clarification. FINDINGS  Left Ventricle: Left ventricular ejection fraction, by estimation, is 60 to 65%. The left ventricle has normal function. The left ventricle has no regional wall motion abnormalities. The left ventricular internal cavity size was normal in size. There is  moderate left ventricular hypertrophy. Left ventricular diastolic parameters are consistent with Grade I diastolic dysfunction (impaired relaxation). Right Ventricle: The right ventricular size is mildly enlarged. No increase in right ventricular wall thickness. Right ventricular systolic function is normal. Left Atrium: Left atrial size was normal in  size. Right Atrium: Right atrial size was mildly dilated. Pericardium: There is no evidence of pericardial effusion. Mitral Valve: The mitral valve is normal in structure. No evidence of mitral valve regurgitation. No evidence of mitral valve stenosis. Tricuspid Valve: The tricuspid valve is normal in structure. Tricuspid valve regurgitation is not demonstrated. No evidence of tricuspid stenosis. Aortic Valve: There is a shaggy mobile echodensity on the ventricular surface of the TAVR valve concerning for vegetation. Has TEE scheduled on 12/08/21 for clarification. The aortic valve has been repaired/replaced. Aortic valve regurgitation is not visualized. No aortic stenosis is present. Aortic valve mean gradient measures 10.0 mmHg. Aortic valve peak gradient measures 20.4 mmHg. Aortic valve area, by VTI measures 1.08 cm. 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 Procedure Date: 06-07-21. Pulmonic Valve: The pulmonic valve was normal in structure. Pulmonic valve regurgitation is not visualized. No evidence of pulmonic stenosis. Aorta: The aortic root is normal in size and structure. Venous: The inferior vena cava is normal in size with greater than 50% respiratory variability, suggesting right atrial pressure of 3 mmHg. IAS/Shunts: No atrial level shunt detected by color flow Doppler.  LEFT VENTRICLE PLAX 2D LVIDd:         5.10 cm   Diastology LVIDs:         3.30 cm   LV e' medial:    5.98 cm/s LV PW:         1.50 cm   LV E/e' medial:  13.7 LV IVS:        1.30 cm   LV e' lateral:   4.13 cm/s LVOT diam:     2.20 cm   LV E/e' lateral: 19.9 LV SV:  57 LV SV Index:   27 LVOT Area:     3.80 cm  RIGHT VENTRICLE RV Basal diam:  4.40 cm TAPSE (M-mode): 2.2 cm LEFT ATRIUM           Index        RIGHT ATRIUM           Index LA diam:      3.30 cm 1.56 cm/m   RA Area:     26.40 cm LA Vol (A4C): 96.8 ml 45.68 ml/m  RA Volume:   87.50 ml  41.29 ml/m  AORTIC VALVE AV Area  (Vmax):    1.14 cm AV Area (Vmean):   1.15 cm AV Area (VTI):     1.08 cm AV Vmax:           226.00 cm/s AV Vmean:          145.000 cm/s AV VTI:            0.530 m AV Peak Grad:      20.4 mmHg AV Mean Grad:      10.0 mmHg LVOT Vmax:         67.50 cm/s LVOT Vmean:        43.900 cm/s LVOT VTI:          0.150 m LVOT/AV VTI ratio: 0.28  AORTA Ao Root diam: 3.60 cm MITRAL VALVE MV Area (PHT): 2.83 cm     SHUNTS MV Decel Time: 268 msec     Systemic VTI:  0.15 m MV E velocity: 82.10 cm/s   Systemic Diam: 2.20 cm MV A velocity: 114.00 cm/s MV E/A ratio:  0.72 Candee Furbish MD Electronically signed by Candee Furbish MD Signature Date/Time: 12/06/2021/5:48:51 PM    Final    CT MAXILLOFACIAL WO CONTRAST  Result Date: 12/06/2021 CLINICAL DATA:  Bacteremia, dental caries EXAM: CT MAXILLOFACIAL WITHOUT CONTRAST TECHNIQUE: Multidetector CT imaging of the maxillofacial structures was performed. Multiplanar CT image reconstructions were also generated. RADIATION DOSE REDUCTION: This exam was performed according to the departmental dose-optimization program which includes automated exposure control, adjustment of the mA and/or kV according to patient size and/or use of iterative reconstruction technique. COMPARISON:  CT head 03/07/2019.  CT head neck 04/30/2012 FINDINGS: Osseous: No acute maxillofacial bone fracture. Bony orbital walls are intact. Mandible intact. Temporomandibular joints are aligned without dislocation. Mild periapical lucency associated with the left first maxillary molar (series 8, image 64). No additional significant periapical lucencies. Evaluation for dental caries is limited by artifact related to dental hardware. Orbits: Negative. No traumatic or inflammatory finding. Sinuses: There are a few scattered areas of mucosal thickening in the ethmoid air cells bilaterally. Paranasal sinuses and mastoid air cells are otherwise clear. No air-fluid levels. Soft tissues: No soft tissue swelling or fluid collections. No  lymphadenopathy is evident. Limited intracranial: No significant or unexpected finding. IMPRESSION: 1. Mild periapical lucency associated with the left first maxillary molar. Evaluation for dental caries is limited by artifact related to dental hardware. No evidence of periapical abscess. 2. Minimal mucosal thickening within the ethmoid air cells. No evidence of odontogenic sinusitis. 3. No acute maxillofacial bone fracture. Electronically Signed   By: Davina Poke D.O.   On: 12/06/2021 14:01   MR THORACIC SPINE WO CONTRAST  Result Date: 12/05/2021 CLINICAL DATA:  Mid back pain EXAM: MRI THORACIC SPINE WITHOUT CONTRAST TECHNIQUE: Multiplanar, multisequence MR imaging of the thoracic spine was performed. No intravenous contrast was administered. COMPARISON:  None Available. FINDINGS: Alignment:  Physiologic.  Vertebrae: No fracture, evidence of discitis, or bone lesion. Cord:  Normal signal and morphology. Paraspinal and other soft tissues: Negative. Disc levels: There is no spinal canal or neural foraminal stenosis. IMPRESSION: No discitis-osteomyelitis or epidural collection. Electronically Signed   By: Ulyses Jarred M.D.   On: 12/05/2021 22:05     Imaging independently reviewed in Epic.    Raynelle Highland for Infectious Disease [facility] [phone] pager 12/07/2021, 8:20 AM

## 2021-12-08 ENCOUNTER — Inpatient Hospital Stay (HOSPITAL_COMMUNITY): Payer: Medicare Other | Admitting: Anesthesiology

## 2021-12-08 ENCOUNTER — Encounter (HOSPITAL_COMMUNITY)
Admission: EM | Disposition: A | Discharge status: Skilled Nursing Facility | Payer: Self-pay | Source: Home / Self Care | Attending: Internal Medicine

## 2021-12-08 ENCOUNTER — Encounter (HOSPITAL_COMMUNITY): Payer: Self-pay | Admitting: Internal Medicine

## 2021-12-08 ENCOUNTER — Inpatient Hospital Stay (HOSPITAL_COMMUNITY): Payer: Medicare Other

## 2021-12-08 DIAGNOSIS — R7881 Bacteremia: Secondary | ICD-10-CM | POA: Diagnosis not present

## 2021-12-08 DIAGNOSIS — I340 Nonrheumatic mitral (valve) insufficiency: Secondary | ICD-10-CM

## 2021-12-08 DIAGNOSIS — M4646 Discitis, unspecified, lumbar region: Secondary | ICD-10-CM

## 2021-12-08 DIAGNOSIS — J449 Chronic obstructive pulmonary disease, unspecified: Secondary | ICD-10-CM | POA: Diagnosis not present

## 2021-12-08 DIAGNOSIS — I2510 Atherosclerotic heart disease of native coronary artery without angina pectoris: Secondary | ICD-10-CM | POA: Diagnosis not present

## 2021-12-08 DIAGNOSIS — I10 Essential (primary) hypertension: Secondary | ICD-10-CM | POA: Diagnosis not present

## 2021-12-08 DIAGNOSIS — T826XXD Infection and inflammatory reaction due to cardiac valve prosthesis, subsequent encounter: Secondary | ICD-10-CM | POA: Diagnosis not present

## 2021-12-08 DIAGNOSIS — Z952 Presence of prosthetic heart valve: Secondary | ICD-10-CM | POA: Diagnosis not present

## 2021-12-08 DIAGNOSIS — G062 Extradural and subdural abscess, unspecified: Secondary | ICD-10-CM

## 2021-12-08 HISTORY — PX: TEE WITHOUT CARDIOVERSION: SHX5443

## 2021-12-08 LAB — CBC WITH DIFFERENTIAL/PLATELET
Abs Immature Granulocytes: 0.34 10*3/uL — ABNORMAL HIGH (ref 0.00–0.07)
Basophils Absolute: 0.1 10*3/uL (ref 0.0–0.1)
Basophils Relative: 1 %
Eosinophils Absolute: 0.1 10*3/uL (ref 0.0–0.5)
Eosinophils Relative: 1 %
HCT: 38.6 % — ABNORMAL LOW (ref 39.0–52.0)
Hemoglobin: 12.8 g/dL — ABNORMAL LOW (ref 13.0–17.0)
Immature Granulocytes: 3 %
Lymphocytes Relative: 10 %
Lymphs Abs: 1.3 10*3/uL (ref 0.7–4.0)
MCH: 31.4 pg (ref 26.0–34.0)
MCHC: 33.2 g/dL (ref 30.0–36.0)
MCV: 94.8 fL (ref 80.0–100.0)
Monocytes Absolute: 0.9 10*3/uL (ref 0.1–1.0)
Monocytes Relative: 7 %
Neutro Abs: 10 10*3/uL — ABNORMAL HIGH (ref 1.7–7.7)
Neutrophils Relative %: 78 %
Platelets: 211 10*3/uL (ref 150–400)
RBC: 4.07 MIL/uL — ABNORMAL LOW (ref 4.22–5.81)
RDW: 14 % (ref 11.5–15.5)
WBC: 12.7 10*3/uL — ABNORMAL HIGH (ref 4.0–10.5)
nRBC: 0 % (ref 0.0–0.2)

## 2021-12-08 LAB — ECHO TEE
AR max vel: 1.5 cm2
AV Area VTI: 1.75 cm2
AV Area mean vel: 1.24 cm2
AV Mean grad: 12 mmHg
AV Peak grad: 23.4 mmHg
Ao pk vel: 2.42 m/s

## 2021-12-08 LAB — BASIC METABOLIC PANEL
Anion gap: 12 (ref 5–15)
BUN: 20 mg/dL (ref 8–23)
CO2: 32 mmol/L (ref 22–32)
Calcium: 8.8 mg/dL — ABNORMAL LOW (ref 8.9–10.3)
Chloride: 92 mmol/L — ABNORMAL LOW (ref 98–111)
Creatinine, Ser: 0.84 mg/dL (ref 0.61–1.24)
GFR, Estimated: >60 mL/min (ref >60)
Glucose, Bld: 115 mg/dL — ABNORMAL HIGH (ref 70–99)
Potassium: 4.7 mmol/L (ref 3.5–5.1)
Sodium: 136 mmol/L (ref 135–145)

## 2021-12-08 LAB — GENTAMICIN LEVEL, TROUGH: Gentamicin Trough: 1 ug/mL (ref 0.5–2.0)

## 2021-12-08 LAB — GLUCOSE, CAPILLARY: Glucose-Capillary: 123 mg/dL — ABNORMAL HIGH (ref 70–99)

## 2021-12-08 SURGERY — ECHOCARDIOGRAM, TRANSESOPHAGEAL
Anesthesia: Monitor Anesthesia Care

## 2021-12-08 MED ORDER — OXYCODONE-ACETAMINOPHEN 5-325 MG PO TABS
1.0000 | ORAL_TABLET | Freq: Three times a day (TID) | ORAL | Status: DC | PRN
  Administered 2021-12-09 – 2021-12-15 (×11): 1 via ORAL
  Filled 2021-12-08 (×12): qty 1

## 2021-12-08 MED ORDER — PROPOFOL 500 MG/50ML IV EMUL
INTRAVENOUS | Status: DC | PRN
  Administered 2021-12-08: 75 ug/kg/min via INTRAVENOUS
  Administered 2021-12-08: 20 mg via INTRAVENOUS
  Administered 2021-12-08: 30 mg via INTRAVENOUS
  Administered 2021-12-08 (×2): 20 mg via INTRAVENOUS

## 2021-12-08 MED ORDER — BUTAMBEN-TETRACAINE-BENZOCAINE 2-2-14 % EX AERO
INHALATION_SPRAY | CUTANEOUS | Status: DC | PRN
  Administered 2021-12-08: 2 via TOPICAL

## 2021-12-08 MED ORDER — PHENYLEPHRINE 80 MCG/ML (10ML) SYRINGE FOR IV PUSH (FOR BLOOD PRESSURE SUPPORT)
PREFILLED_SYRINGE | INTRAVENOUS | Status: DC | PRN
  Administered 2021-12-08 (×3): 160 ug via INTRAVENOUS
  Administered 2021-12-08: 240 ug via INTRAVENOUS
  Administered 2021-12-08 (×2): 80 ug via INTRAVENOUS
  Administered 2021-12-08: 160 ug via INTRAVENOUS
  Administered 2021-12-08 (×2): 80 ug via INTRAVENOUS

## 2021-12-08 MED ORDER — NALOXONE HCL 0.4 MG/ML IJ SOLN
0.4000 mg | INTRAMUSCULAR | Status: DC | PRN
Start: 2021-12-08 — End: 2021-12-16

## 2021-12-08 MED ORDER — EPHEDRINE SULFATE-NACL 50-0.9 MG/10ML-% IV SOSY
PREFILLED_SYRINGE | INTRAVENOUS | Status: DC | PRN
  Administered 2021-12-08 (×3): 5 mg via INTRAVENOUS

## 2021-12-08 MED ORDER — SENNOSIDES-DOCUSATE SODIUM 8.6-50 MG PO TABS
1.0000 | ORAL_TABLET | Freq: Two times a day (BID) | ORAL | Status: DC
  Administered 2021-12-08 – 2021-12-16 (×10): 1 via ORAL
  Filled 2021-12-08 (×14): qty 1

## 2021-12-08 MED ORDER — LORAZEPAM 1 MG PO TABS
1.0000 mg | ORAL_TABLET | ORAL | Status: DC
Start: 2021-12-08 — End: 2021-12-08
  Filled 2021-12-08: qty 1

## 2021-12-08 MED ORDER — MORPHINE SULFATE (PF) 2 MG/ML IV SOLN
1.0000 mg | Freq: Once | INTRAVENOUS | Status: AC
  Filled 2021-12-08: qty 1

## 2021-12-08 MED ORDER — OXYCODONE-ACETAMINOPHEN 5-325 MG PO TABS
1.0000 | ORAL_TABLET | Freq: Once | ORAL | Status: AC
  Administered 2021-12-08: 1 via ORAL
  Filled 2021-12-08: qty 1

## 2021-12-08 NOTE — Progress Notes (Signed)
[facility]                                                                              Kyle Lewis, is a 76 y.o. male, DOB - 10-02-1945, DZH:[id number] Admit date - 11/30/2021    Outpatient Primary MD for the patient is [name], MD  LOS - 8  days  Chief Complaint  Patient presents with   Back Pain       Brief summary   Patient is a 76 year old male with hypertension, HLD,  COPD on home 02 prn, smoker, OSA,on cpap CAD, AAA repair, TAVR due to aortic stenosis (06/2021),  chronic back pain presented to ED with worsening back pain despite use of Ultram and prednisone given to the patient on 6/23.  Patient also reported cough with chills and wheezing.  Patient was hypoxic in ED to 83% on room air.  Patient was placed on 4 L O2 via [place] and sats improved to 94%.  Patient reported symptoms for 2 weeks and worse in the last 1 week. Chest x-ray showed mild bibasilar atelectasis and/or pneumonia.  In ED, patient was noted to have increasing O2 needs and desatted to 70%.  He was transitioned to NRB.  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 -Blood culture sensitivities returned back as Gemella sanguinis, concerning for endocarditis in the setting of prior TAVR  -ID consulted, appreciate recommendations starting penicillin and gentamicin for suspected TAVR endocarditis -echo is concerning for "shaggy mobile echodensity on the ventricular surface of the  TAVR valve concerning for vegetation" s/p TEE on 7/6 -MRI thoracic spine with no discitis or osteomyelitis, MRI lumbar spine concerning for epidural abscess/discitis osteomyelitis, neurosurgery consult  requested , will follow recommendation  -CT maxillofacial showed no dental abscess -left shoulder pain with limited range of motion for 3wks, awaiting for  left shoulder mri w /wo contrast  -appreciate ID input, will follow recommendation     Accelerated hypertension -Improving, continue Norvasc, metoprolol -2D echo in 07/2021 had shown EF of 60 to 65%, G1 DD    COPD with acute exacerbation ([place]) -Wheezing much improved continue scheduled DuoNebs, Pulmicort, Brovana,  -Oral prednisone for 3 days   OSA (obstructive sleep apnea) -Continue CPAP    Tobacco use -Counseled smoking cessation    Hyperlipidemia LDL goal <70 -Continue statin  AAA repair -CTA noted no acute dissection or acute aortic process.  A tiny endoleak in the anterior endosac would be difficult to exclude, smaller than on previous imaging, potentially representing calcification, recommended follow-up -Explained above to the patient and will follow-up with cardiology outpatient.  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   Weight as of this encounter: 99.3 kg.  OSA on  reports use bipap at night at home, reports use home o2 prn at home  Reports baseline imbalance, use walker/cane  Code Status: Full code DVT Prophylaxis:  heparin injection 5,000 Units Start: 11/30/21 1630   Level of Care: Level of care: Progressive Family Communication:  wife at bedside on 7/6 Plan for disposition to SNF when medically ready   Disposition Plan:      Remains inpatient appropriate: Suspected Endocarditis, on IV antibiotics  Procedures:  TEE  Consultants:   Infectious disease Cardiology neurosurgery  Antimicrobials:   Anti-infectives (From admission, onward)    Start     Dose/Rate Route Frequency Ordered Stop   12/05/21 1800  [MAR Hold]  gentamicin  (GARAMYCIN) 240 mg in dextrose 5 % 50 mL IVPB        (MAR Hold since Thu 12/08/2021 at 0935.Hold Reason: Transfer to a Procedural area)   3 mg/kg  80.6 kg (Adjusted) 112 mL/hr over 30 Minutes Intravenous Every 24 hours 12/05/21 1317     12/05/21 1400  [MAR Hold]  penicillin G potassium 12 Million Units in dextrose 5 % 500 mL continuous infusion        (MAR Hold since Thu 12/08/2021 at 0935.Hold Reason: Transfer to a Procedural area)   12 Million Units 41.7 mL/hr over 12 Hours Intravenous Every 12 hours 12/05/21 1305     12/02/21 1800  vancomycin (VANCOREADY) IVPB 1250 mg/250 mL  Status:  Discontinued        1,250 mg 166.7 mL/hr over 90 Minutes Intravenous Every 24 hours 12/01/21 1610 12/05/21 1305   12/01/21 1800  azithromycin (ZITHROMAX) tablet 500 mg        500 mg Oral Every evening 12/01/21 0832 12/04/21 1714   12/01/21 1700  vancomycin (VANCOREADY) IVPB 1750 mg/350 mL        1,750 mg 175 mL/hr over 120 Minutes Intravenous  Once 12/01/21 1610 12/01/21 2045   11/30/21 1630  Ampicillin-Sulbactam (UNASYN) 3 g in sodium chloride 0.9 % 100 mL IVPB  Status:  Discontinued        3 g 200 mL/hr over 30 Minutes Intravenous Every 6 hours 11/30/21 1621 12/05/21 1305   11/30/21 1630  azithromycin (ZITHROMAX) 500 mg in sodium chloride 0.9 % 250 mL IVPB  Status:  Discontinued        500 mg 250 mL/hr over 60 Minutes Intravenous Every 24 hours 11/30/21 1622 12/01/21 0832   11/30/21 0930  Ampicillin-Sulbactam (UNASYN) 3 g in sodium chloride 0.9 % 100 mL IVPB        3 g 200 mL/hr over 30 Minutes Intravenous  Once 11/30/21 0923 11/30/21 1120          Medications  [MAR Hold] amLODipine  10 mg Oral Daily   [MAR Hold] arformoterol  15 mcg Nebulization BID   [MAR Hold] atorvastatin  40 mg Oral Daily   [MAR Hold] budesonide (PULMICORT) nebulizer solution  0.25 mg Nebulization BID   [MAR Hold] finasteride  5 mg Oral Daily   [MAR Hold] gabapentin  300 mg Oral BID   [MAR Hold] guaiFENesin  400 mg Oral BID    [MAR Hold] heparin  5,000 Units Subcutaneous Q8H   [MAR Hold] metoprolol tartrate  25 mg Oral BID   [MAR Hold] nicotine  21 mg Transdermal Daily   [MAR Hold] sertraline  50 mg Oral Daily   [MAR Hold] tamsulosin  0.4 mg Oral QHS      Subjective:   Kyle Lewis was seen and examined today.  Seen after returned fromTEE  He is sitting up in chair eating lunch He is refusing mri due to too much pain laying down, wife requested dilaudid, I have explained to them that dilaudid might be too strong for him due to his underline lung issues, they agreed with low dose iv morphinx1 and percocet x1 He reports low back pain for 5 months and left shoulder pain for three weeks Currently on 3liter oxygen, denies sob, no chest pain, reports chronic cough at baseline, no edema, no fever  Wife reports patient use bipap at home , but he never clean it , she is wondering this might be the cause of his infection   Objective:   Vitals:   12/07/21 2106 12/07/21 2336 12/08/21 0500 12/08/21 0930  BP: 123/78  134/65 (!) 152/72  Pulse: (!) 58 60 61 66  Resp: 18  17   Temp:   98.8 F (37.1 C) 98.1 F (36.7 C)  TempSrc:   Axillary Temporal  SpO2:  98% 99% 96%  Weight:   99.3 kg 99.3 kg  Height:    '5\' 8"'$  (1.727 m)    Intake/Output Summary (Last 24 hours) at 12/08/2021 1030 Last data filed at 12/08/2021 1027 Gross per 24 hour  Intake 2909.53 ml  Output 1300 ml  Net 1609.53 ml     Wt Readings from Last 3 Encounters:  12/08/21 99.3 kg  11/25/21 97.8 kg  11/09/21 97.8 kg    Physical Exam General: Alert and oriented x 3, NAD, slightly impaired hearing  Cardiovascular: S1 S2 clear, RRR. Respiratory: CTAB, no wheezing Gastrointestinal: Soft, nontender, nondistended, NBS Ext: no pedal edema bilaterally, scatter ecchymosis on arms/legs, left shoulder range of motion limited due to pain Neuro: no new deficits   Data Reviewed:  I have personally reviewed following labs    CBC Lab Results   Component Value Date   WBC 12.7 (H) 12/08/2021   RBC 4.07 (L) 12/08/2021   HGB 12.8 (L) 12/08/2021   HCT 38.6 (L) 12/08/2021   MCV 94.8 12/08/2021   MCH 31.4 12/08/2021   PLT 211 12/08/2021   MCHC 33.2 12/08/2021   RDW 14.0 12/08/2021   LYMPHSABS 1.3 12/08/2021   MONOABS 0.9 12/08/2021   EOSABS 0.1 12/08/2021   BASOSABS 0.1 72/02/4708     Last metabolic panel Lab Results  Component Value Date   NA 136 12/08/2021   K 4.7 12/08/2021   CL 92 (L) 12/08/2021   CO2 32 12/08/2021   BUN 20 12/08/2021   CREATININE 0.84 12/08/2021   GLUCOSE 115 (H) 12/08/2021   GFRNONAA >60 12/08/2021   GFRAA 107 04/08/2020   CALCIUM 8.8 (L) 12/08/2021   PROT 5.8 (L) 12/01/2021   ALBUMIN 2.4 (L) 12/01/2021   LABGLOB 2.7 09/25/2019   AGRATIO 1.4 09/25/2019   BILITOT 0.8 12/01/2021   ALKPHOS 68 12/01/2021   AST 15 12/01/2021   ALT 18 12/01/2021   ANIONGAP 12 12/08/2021    CBG (last 3)  Recent Labs    12/06/21 1612 12/07/21 0644 12/08/21 0702  GLUCAP 206* 121* 123*        Florencia Reasons M.D. PhD FACP [facility] 12/08/2021, 10:30 AM  Available via Epic secure chat 7am-7pm After 7 pm, please refer to night coverage provider listed on amion.

## 2021-12-08 NOTE — TOC Progression Note (Signed)
Transition of Care [facility]) - Progression Note    Patient Details  Name: Kyle Lewis MRN: [id number] Date of Birth: 1946/01/25  Transition of Care [facility]) CM/SW [place], [place] Phone Number: 12/08/2021, 12:06 PM  Clinical Narrative:     Patient has SNF bed at [facility] when medically ready for dc. CSW will continue to follow and assist with patients dc planning needs.  Expected Discharge Plan: [place] Barriers to Discharge: Continued Medical Work up  Expected Discharge Plan and Services Expected Discharge Plan: [place] In-house Referral: Clinical Social Work     Living arrangements for the past 2 months: Single Family Home                                       Social Determinants of Health (SDOH) Interventions    Readmission Risk Interventions    06/10/2021   10:04 AM  Readmission Risk Prevention Plan  Transportation Screening Complete  PCP or Specialist Appt within 5-7 Days Complete  Home Care Screening Complete  Medication Review (RN CM) Complete

## 2021-12-08 NOTE — Progress Notes (Signed)
Occupational Therapy Treatment Patient Details Name: Kyle Lewis MRN: [id number] DOB: [birth year] Today's Date: 12/08/2021   History of present illness 76 y.o. male presents to [facility] hospital on 11/30/2021 with increase in back pain as well as cough and chills. Pt found to have CAP with associated respiratory failure. PMH includes HTN, COPD, OSA, CAD, AAA, chronic back pain.   OT comments  Pt with urinary incontinence while at TEE, wife states he is incontinent at baseline. Min assist for bed mobility using log roll technique. Stood with min guard assist, but pulling up on walker with min guard assist and transferred to chair. Total assist for pericare. Pt completed seated grooming and remained up in chair drinking cup of coffee with wife.    Recommendations for follow up therapy are one component of a multi-disciplinary discharge planning process, led by the attending physician.  Recommendations may be updated based on patient status, additional functional criteria and insurance authorization.    Follow Up Recommendations  Skilled nursing-short term rehab (<3 hours/day)    Assistance Recommended at Discharge Frequent or constant Supervision/Assistance  Patient can return home with the following  A little help with walking and/or transfers;A lot of help with bathing/dressing/bathroom;Assistance with cooking/housework;Assist for transportation;Help with stairs or ramp for entrance;Direct supervision/assist for medications management;Direct supervision/assist for financial management   Equipment Recommendations  None recommended by OT    Recommendations for Other Services      Precautions / Restrictions Precautions Precautions: Fall Restrictions Weight Bearing Restrictions: No       Mobility Bed Mobility Overal bed mobility: Needs Assistance Bed Mobility: Rolling, Sidelying to Sit Rolling: Min assist Sidelying to sit: Min assist       General bed mobility comments: cues for log  roll to minimize back pain, use of rail    Transfers Overall transfer level: Needs assistance Equipment used: Rolling walker (2 wheels) Transfers: Sit to/from Stand Sit to Stand: Min guard, From elevated surface           General transfer comment: insists on pulling up on RW despite cues     Balance Overall balance assessment: Needs assistance Sitting-balance support: No upper extremity supported, Feet supported Sitting balance-Leahy Scale: Fair     Standing balance support: Bilateral upper extremity supported, Reliant on assistive device for balance Standing balance-Leahy Scale: Poor                             ADL either performed or assessed with clinical judgement   ADL Overall ADL's : Needs assistance/impaired                             Toileting- Clothing Manipulation and Hygiene: Total assistance;Sit to/from stand              Extremity/Trunk Assessment              Vision       Perception     Praxis      Cognition Arousal/Alertness: Awake/alert Behavior During Therapy: Flat affect Overall Cognitive Status: Impaired/Different from baseline Area of Impairment: Problem solving, Following commands                       Following Commands: Follows one step commands with increased time     Problem Solving: Slow processing, Difficulty sequencing, Requires verbal cues General Comments: needing multimodal cues to follow commands  Exercises      Shoulder Instructions       General Comments      Pertinent Vitals/ Pain       Pain Assessment Pain Assessment: Faces Faces Pain Scale: Hurts whole lot Pain Location: low back Pain Descriptors / Indicators: Grimacing, Guarding, Moaning Pain Intervention(s): Monitored during session, Repositioned, Patient requesting pain meds-RN notified  Home Living                                          Prior Functioning/Environment               Frequency  Min 2X/week        Progress Toward Goals  OT Goals(current goals can now be found in the care plan section)  Progress towards OT goals: Progressing toward goals  Acute Rehab OT Goals OT Goal Formulation: With patient Time For Goal Achievement: 12/17/21 Potential to Achieve Goals: [place] Discharge plan remains appropriate    Co-evaluation                 AM-PAC OT "6 Clicks" Daily Activity     Outcome Measure   Help from another person eating meals?: None Help from another person taking care of personal grooming?: A Little Help from another person toileting, which includes using toliet, bedpan, or urinal?: Total Help from another person bathing (including washing, rinsing, drying)?: A Lot Help from another person to put on and taking off regular upper body clothing?: A Little Help from another person to put on and taking off regular lower body clothing?: A Lot 6 Click Score: 15    End of Session Equipment Utilized During Treatment: Rolling walker (2 wheels);Gait belt  OT Visit Diagnosis: Unsteadiness on feet (R26.81);Other abnormalities of gait and mobility (R26.89);Muscle weakness (generalized) (M62.81);Pain   Activity Tolerance Patient tolerated treatment well   Patient Left in chair;with call bell/phone within reach;with chair alarm set;with family/visitor present   Nurse Communication Patient requests pain meds;Other (comment) (ok to give pt coffee)        Time: 0867-6195 OT Time Calculation (min): 17 min  Charges: OT General Charges $OT Visit: 1 Visit OT Treatments $Self Care/Home Management : 8-22 mins Cleta Alberts, OTR/L Acute Rehabilitation Services Office: [phone]   Malka So 12/08/2021, 12:01 PM

## 2021-12-08 NOTE — Consult Note (Signed)
Subjective: Patient is a 76 y.o. male who complains of back pain. Onset of symptoms was 3 weeks ago, gradually worsening since that time.  Onset was not related to a remote injury. The pain is rated severe, and is located at the across the lower back. The pain is described as aching and occurs all day.  Symptoms are exacerbated by twisting and walking for more than a few minutes.  He is status post L2-L5 fusion remotely. 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  Scan was done without contrast.  He has been in the hospital for several days and has endocarditis and is on IV antibiotics per infectious disease.  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   Abnormal gait 07/31/2019   Abscess of left leg    Acute on chronic diastolic heart failure (HCC)    Acute respiratory failure with hypoxia and hypercapnia ([place]) 05/27/2021   HC03   06/14/21    =  31  - 06/23/2021   Walked 250 ft  at a slow pace with a cane. Complained of back and left knee hurting with lowest sat 89% so d/c'd 02    Adenomatous colon polyp    Adrenal mass ([place])    2 benign appearing left adrenal adenomas noted on 01/13/15 CT   Allergy    seasonal   Anxiety 04/08/2020   Arthritis    Blister of multiple sites of lower extremity 01/10/2018   Body mass index (BMI) 34.0-34.9, adult 04/07/2020   BPH with obstruction/lower urinary tract symptoms    Overview:  Probable based on symptoms   CAD (coronary artery disease) 02/01/2018   Carotid arterial disease ([place]) 05/23/2012   Carotid artery occlusion    Chronic back pain    Cigarette smoker 09/05/2018   COLONIC POLYPS, ADENOMATOUS, HX OF 03/17/2010   Qualifier: Diagnosis of  By: Harlon Ditty CMA (AAMA), Dottie     COPD GOLD II 04/07/2019   Quit smoking 03/2019 - PFT's  04/07/2019  FEV1 1.58 (57 % ) ratio 0.54  p 0 % improvement from saba p  nothing prior to study with DLCO  15.22 (66%) corrects to 3.05 (74%)  for alv volume and FV curve concave classically     Degeneration of cervical intervertebral disc    Degeneration of lumbar intervertebral disc 06/14/2015   Depression 03/17/2010   Qualifier: Diagnosis of  By: Nelson-Smith CMA (AAMA), Dottie     Diverticulosis    DIVERTICULOSIS, COLON 03/17/2010   Qualifier: Diagnosis of  By: Harlon Ditty CMA (AAMA), Dottie     Dyspnea    Exogenous obesity 11/08/2013   Overview:  With a nine pound weight gain since his last visit   Heart murmur    History of back surgery    Rods and Screws in back   History of craniotomy 01/11/2018   History of left knee replacement    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 Oneida Alar and 06/10/2015 last visit   Impairment of balance 10/27/2021   Inflammation of sacroiliac joint ([place]) 05/06/2019   Lower urinary tract symptoms (LUTS) 08/06/2017   Meningioma ([place])    Microscopic hematuria 11/08/2013  Mitral regurgitation 06/02/2021   Muscle weakness 10/27/2021   Neck pain 03/25/2019   Nocturia    OA (osteoarthritis) of knee 04/01/2012   OAB (overactive bladder) 01/19/2014   OSA (obstructive sleep apnea)    Bipap per Chodri since ? 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 MAngelena Form& Dr. BCyndia Bent   Severe aortic stenosis 02/01/2018   ECHO 01/30/18 - Left ventricle: The cavity size was normal. There was moderate   focal basal hypertrophy of the septum. Systolic function was   vigorous. The estimated ejection fraction was in the range of 65%   to 70%. Wall motion was normal; there were no regional wall   motion abnormalities. Doppler parameters are consistent with   abnormal left ventricular relaxation (grade 1 diastolic   dysfun   Status post AAA (abdominal aortic aneurysm) repair 02/01/2018   [place]   tia no residual deficit from   Tobacco use 11/08/2013   Ventral hernia 11/08/2013   Overview:  S/p repair with alloderm mesh and s/p SB resection    Past Surgical History:  Procedure Laterality Date   ABDOMINAL AORTIC ANEURYSM REPAIR  2019   [place] as child   BACK SURGERY  2018   Rods and Screws in Back lower back   BRAIN MENINGIOMA EXCISION  1991   menigioma   CARPAL TUNNEL RELEASE     left   EYE SURGERY Bilateral    Cataract   IRRIGATION AND DEBRIDEMENT ABSCESS Left 08/25/2020   Procedure: IRRIGATION AND DEBRIDEMENT ABSCESS LEFT LEG;  Surgeon: PEvelina Bucy DPM;  [place] [facility]  Service: Podiatry;  Laterality: Left;   JOINT REPLACEMENT Left 04-01-12   Knee   LEFT HEART CATH AND CORONARY ANGIOGRAPHY N/A 09/15/2016   Procedure: Left Heart Cath and Coronary Angiography;  Surgeon: CNelva Bush MD;  [facility];  Service: Cardiovascular;  Laterality: N/A;   MAXIMUM ACCESS (MAS)POSTERIOR LUMBAR INTERBODY FUSION (PLIF) 1 LEVEL N/A 07/07/2015   Procedure:  POSTERIOR LUMBAR INTERBODY FUSION (PLIF) Lumbar Four-Five with Pedicle Screw Fixation Lumbar Two-Five;Laminectomy Lumbar Two-Five;  Surgeon: DEustace Moore MD;  [facility] [facility];  Service: Neurosurgery;  Laterality: N/A;   POSTERIOR LUMBAR INTERBODY FUSION (PLIF) Lumbar Four-Five with Pedicle Screw Fixation Lumbar Two-Five;Laminectomy Lumbar Two-Five   RIGHT/LEFT  HEART CATH AND CORONARY ANGIOGRAPHY N/A 01/24/2018   Procedure: RIGHT/LEFT HEART CATH AND CORONARY ANGIOGRAPHY;  Surgeon: ENelva Bush MD;  [facility];  Service: Cardiovascular;  Laterality: N/A;   RIGHT/LEFT HEART CATH AND CORONARY ANGIOGRAPHY N/A 05/31/2021   Procedure: RIGHT/LEFT HEART CATH AND CORONARY ANGIOGRAPHY;  Surgeon: MBurnell Blanks MD;  [facility];  Service: Cardiovascular;  Laterality: N/A;   TEE WITHOUT CARDIOVERSION N/A 06/07/2021   Procedure: TRANSESOPHAGEAL ECHOCARDIOGRAM (TEE);  Surgeon: MBurnell Blanks MD;  [facility]  Service: Open Heart Surgery;  Laterality: N/A;  TONSILLECTOMY  as child   TOTAL KNEE ARTHROPLASTY  04/01/2012   Procedure: TOTAL KNEE ARTHROPLASTY;  Surgeon: Gearlean Alf, MD;  [facility];  Service: Orthopedics;  Laterality: Left;   TRANSCATHETER AORTIC VALVE REPLACEMENT, TRANSFEMORAL Bilateral 06/07/2021   Procedure: TRANSCATHETER AORTIC VALVE REPLACEMENT, RIGHT TRANSFEMORAL;  Surgeon: Burnell Blanks, MD;  Location: [place];  Service: Open Heart Surgery;  Laterality: Bilateral;   TRANSURETHRAL RESECTION OF PROSTATE     ULTRASOUND GUIDANCE FOR VASCULAR ACCESS Bilateral 06/07/2021   Procedure: ULTRASOUND GUIDANCE FOR VASCULAR ACCESS;  Surgeon: Burnell Blanks, MD;  Location: [place];  Service: Open Heart Surgery;  Laterality: Bilateral;   UMBILICAL HERNIA REPAIR  2011    Allergies  Allergen Reactions   Cefazolin Rash and Other (See Comments)    The patient had surgery and was given cefazolin intraop. ~ 10 days later he developed a rash confirmed by biopsy to be consistent w/ drug eruption. We cannot know for sure, but this is the most likely agent.      Social History   Tobacco Use   Smoking status: Former    Packs/day: 0.75    Years: 40.00    Total pack years: 30.00    Types: Cigarettes    Quit date: 05/05/2021    Years since quitting: 0.5   Smokeless tobacco: Never    Tobacco comments:    1/2 pack a day if that   Substance Use Topics   Alcohol use: Yes    Comment: rare    Family History  Problem Relation Age of Onset   Heart disease Mother        Onset ~34 y/o   Hypertension Mother        Deceased from old age at 58   Hyperlipidemia Mother    Arthritis Mother    Diabetes Father        Deceased from old age at 82   Heart attack Father    Heart disease Father        CABG at age 54   Dementia Father 68   Arthritis Father    Prostate cancer Maternal Grandfather    Colon cancer Neg Hx    Esophageal cancer Neg Hx    Rectal cancer Neg Hx    Stomach cancer Neg Hx    Prior to Admission medications   Medication Sig Start Date End Date Taking? Authorizing Provider  acetaminophen (TYLENOL) 650 MG CR tablet Take 650 mg by mouth at bedtime as needed for pain.   Yes [provider]  amoxicillin (AMOXIL) 500 MG tablet Take 4 tablets (2,000 mg total) by mouth as directed. 1 HOUR PRIOR TO DENTAL PROCEDURES 06/15/21  Yes Eileen Stanford, PA-C  aspirin 81 MG EC tablet Take 1 tablet (81 mg total) by mouth daily. Swallow whole. Patient taking differently: Take 81 mg by mouth at bedtime. Swallow whole. 06/11/21  Yes Florencia Reasons, MD  atorvastatin (LIPITOR) 40 MG tablet Take 1 tablet (40 mg total) by mouth daily. Patient taking differently: Take 40 mg by mouth at bedtime. 08/12/21  Yes [name], MD  finasteride (PROSCAR) 5 MG tablet Take 1 tablet (5 mg total) by mouth daily. Patient taking differently: Take 5 mg by mouth at bedtime. 05/05/21  Yes Fargo, Amy E, NP  gabapentin (NEURONTIN) 300 MG capsule Take 1 capsule (300 mg total) by mouth 2 (two) times daily. Patient taking differently: Take 300 mg by mouth daily. 05/05/21  Yes Yvonna Alanis, NP  metoprolol tartrate (LOPRESSOR) 25 MG tablet Take 1/2 tablet (12.5 mg total) by mouth 2 (two) times daily. 11/18/21  Yes [name], MD  naproxen sodium (ALEVE) 220 MG tablet Take 220 mg by mouth as  needed (pain).   Yes [provider]  predniSONE (STERAPRED UNI-PAK 21 TAB) 10 MG (21) TBPK tablet Take 10 mg by mouth in the morning, at noon, and at bedtime. 11/26/21  Yes [provider]  sertraline (ZOLOFT) 50 MG tablet Take 1 tablet (50 mg total) by mouth daily. 06/15/21  Yes Eileen Stanford, PA-C  tamsulosin (FLOMAX) 0.4 MG CAPS capsule TAKE 1 CAPSULE BY MOUTH ONCE DAILY AFTER SUPPER Patient taking differently: Take 0.4 mg by mouth at bedtime. 07/18/21  Yes [name], MD  traMADol (ULTRAM) 50 MG tablet Take 50 mg by mouth as needed (pain). 11/26/21  Yes [provider]  albuterol (VENTOLIN HFA) 108 (90 Base) MCG/ACT inhaler Inhale 2 puffs into the lungs every 6 (six) hours as needed for wheezing or shortness of breath. 06/10/21   Florencia Reasons, MD  fluticasone furoate-vilanterol (BREO ELLIPTA) 200-25 MCG/ACT AEPB Inhale 1 puff into the lungs daily. Patient not taking: Reported on 11/30/2021 06/11/21   Florencia Reasons, MD  furosemide (LASIX) 40 MG tablet Take 1 tablet (40 mg total) by mouth as needed. Patient not taking: Reported on 11/30/2021 06/15/21   Eileen Stanford, PA-C  mirabegron ER (MYRBETRIQ) 25 MG TB24 tablet Take 1 tablet (25 mg total) by mouth every morning. Patient not taking: Reported on 11/30/2021 09/27/21   [name], MD  olmesartan (BENICAR) 40 MG tablet Take 1 tablet (40 mg total) by mouth daily. Please hold this medication, please discuss with your cardiology when should you restart this medication, Patient not taking: Reported on 11/30/2021 06/10/21   Florencia Reasons, MD     Review of Systems  Positive ROS: Negative  All other systems have been reviewed and were otherwise negative with the exception of those mentioned in the HPI and as above.  Objective: Vital signs in last 24 hours: Temp:  [98.3 F (36.8 C)-98.8 F (37.1 C)] 98.8 F (37.1 C) (07/06 0500) Pulse Rate:  [55-61] 61 (07/06 0500) Resp:  [17-20] 17 (07/06 0500) BP: (123-141)/(65-78)  134/65 (07/06 0500) SpO2:  [98 %-99 %] 99 % (07/06 0500) Weight:  [99.3 kg] 99.3 kg (07/06 0500)  General Appearance: Alert, cooperative, no distress, appears stated age Head: Normocephalic, without obvious abnormality, atraumatic Eyes: PERRL, conjunctiva/corneas clear, EOM's intact   Neck: Supple, symmetrical, trachea midlineness Lungs: Clear to auscultation bilaterally, respirations unlabored Heart: Regular rate and rhythm Abdomen: Soft, non-tender Extremities: Extremities normal, atraumatic, no cyanosis or edema Pulses: 2+ and symmetric all extremities Skin: Skin color, texture, turgor normal, no rashes or lesions  NEUROLOGIC:   Mental status: alert and oriented, no aphasia, good attention span, Fund of knowledge/ memory ok Motor Exam - grossly normal except for at the left ankle where he has poor dorsiflexion but excellent EHL strength Sensory Exam - grossly normal Reflexes:  Coordination - grossly normal Gait -not tested but he walked 80 feet with physical therapy yesterday (he has had a gait disturbance for quite a long time) Balance -not tested Cranial Nerves: I: smell Not tested  II: visual acuity  OS: na    OD: na  II: visual fields Full to confrontation  II: pupils Equal, round, reactive to light  III,VII: ptosis None  III,IV,VI: extraocular muscles  Full ROM  V: mastication Normal  V: facial light touch sensation  Normal  V,VII: corneal reflex  Present  VII: facial muscle function - upper  Normal  VII: facial muscle function - lower Normal  VIII: hearing Not tested  IX: soft palate elevation  Normal  IX,X: gag reflex Present  XI: trapezius strength  5/5  XI: sternocleidomastoid strength 5/5  XI: neck flexion strength  5/5  XII: tongue strength  Normal    Data Review Lab Results  Component Value Date   WBC 12.7 (H) 12/08/2021   HGB 12.8 (L) 12/08/2021   HCT 38.6 (L) 12/08/2021   MCV 94.8 12/08/2021   PLT 211 12/08/2021   Lab Results  Component Value  Date   NA 136 12/08/2021   K 4.7 12/08/2021   CL 92 (L) 12/08/2021   CO2 32 12/08/2021   BUN 20 12/08/2021   CREATININE 0.84 12/08/2021   GLUCOSE 115 (H) 12/08/2021   Lab Results  Component Value Date   INR 1.0 12/07/2021    Assessment/Plan: 76 year old gentleman who is status post L2-L5 fusion in the remote past who comes in with a 3-week history of back pain with some radicular features, imaging without contrast shows a ventral epidural fluid collection consistent with epidural abscess, likely coming from the facets at L5-S1, no spondylolisthesis.  Stenosis is moderate.  I think his foot drop on the left is likely old and fixed.  He appears to be walking okay with physical therapy.  He is on culture directed antibiotics  I have ordered an MRI of the lumbar spine with and without contrast to get a better evaluation to help with decision-making.  Eustace Moore 12/08/2021 8:21 AM

## 2021-12-08 NOTE — Transfer of Care (Signed)
Immediate Anesthesia Transfer of Care Note  Patient: Kyle Lewis  Procedure(s) Performed: TRANSESOPHAGEAL ECHOCARDIOGRAM (TEE)  Patient Location: PACU and Endoscopy Unit  Anesthesia Type:MAC  Level of Consciousness: drowsy  Airway & Oxygen Therapy: Patient Spontanous Breathing and Patient connected to nasal cannula oxygen  Post-op Assessment: Report given to RN and Post -op Vital signs reviewed and stable  Post vital signs: Reviewed and stable  Last Vitals:  Vitals Value Taken Time  BP    Temp    Pulse 76 12/08/21 1044  Resp 18 12/08/21 1044  SpO2 94 % 12/08/21 1044  Vitals shown include unvalidated device data.  Last Pain:  Vitals:   12/08/21 0930  TempSrc: Temporal  PainSc: 2       Patients Stated Pain Goal: 0 (47/30/85 6943)  Complications: No notable events documented.

## 2021-12-08 NOTE — Interval H&P Note (Signed)
History and Physical Interval Note:  12/08/2021 9:44 AM  Kyle Lewis  has presented today for surgery, with the diagnosis of bacteremia.  The various methods of treatment have been discussed with the patient and family. After consideration of risks, benefits and other options for treatment, the patient has consented to  Procedure(s): TRANSESOPHAGEAL ECHOCARDIOGRAM (TEE) (N/A) as a surgical intervention.  The patient's history has been reviewed, patient examined, no change in status, stable for surgery.  I have reviewed the patient's chart and labs.  Questions were answered to the patient's satisfaction.     [name]

## 2021-12-08 NOTE — Progress Notes (Signed)
[facility]  Date of Admission:  11/30/2021           Reason for visit: Follow up on TAVR endocarditis  Current antibiotics: PCN Gentamicin  ASSESSMENT:    76 y.o. male admitted with:  Gemella sanguinis bacteremia and TAVR endocarditis: Patient blood cultures from 11/30/21 are positive in 4 of 4 bottles with Gemella.  Susceptibility report has been sent to [place] and is pending.  Going for TEE today. 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  MRI with and without contrast ordered, however, patient only able to tolerate portion of imaging without contrast showing the above abnormalities.  NSGY has seen patient and will re-attempt contrast enhanced imaging today to help with MDM. History of AS s/p TAVR: Completed in January 2023. History of AAA: s/p endovascular repair in January 2019. Left shoulder pain:  Primary team also noted new onset shoulder pain and has ordered an MRI with and without contrast to further evaluate.  RECOMMENDATIONS:    Continue penicillin and gentamicin Follow up TEE today Cardiology/CVTS consult following TEE results for next steps in management Follow up MRI lumbar and shoulder Appreciate NSGY assistance Lab monitoring Follow up cultures Will follow   Principal Problem:   Prosthetic valve endocarditis (HCC) Active Problems:   OSA (obstructive sleep apnea)   Tobacco use   Hyperlipidemia LDL goal <70   S/P TAVR (transcatheter aortic valve replacement)   CAP (community acquired pneumonia)   Accelerated hypertension   Acute respiratory failure with hypoxia (HCC)   COPD with acute exacerbation (HCC)    MEDICATIONS:    Scheduled Meds:  [MAR Hold] amLODipine  10 mg Oral Daily   [MAR Hold] arformoterol  15 mcg Nebulization BID   [MAR Hold] atorvastatin  40 mg Oral Daily   [MAR Hold]  budesonide (PULMICORT) nebulizer solution  0.25 mg Nebulization BID   [MAR Hold] finasteride  5 mg Oral Daily   [MAR Hold] gabapentin  300 mg Oral BID   [MAR Hold] guaiFENesin  400 mg Oral BID   [MAR Hold] heparin  5,000 Units Subcutaneous Q8H   [MAR Hold] metoprolol tartrate  25 mg Oral BID   [MAR Hold] nicotine  21 mg Transdermal Daily   [MAR Hold] sertraline  50 mg Oral Daily   [MAR Hold] tamsulosin  0.4 mg Oral QHS   Continuous Infusions:  [MAR Hold] sodium chloride 10 mL/hr at 12/05/21 2316   sodium chloride 20 mL/hr at 12/08/21 0600   [MAR Hold] gentamicin 240 mg (12/07/21 1825)   [MAR Hold] penicillin G potassium 12 Million Units in dextrose 5 % 500 mL continuous infusion 41.7 mL/hr at 12/08/21 0600   PRN Meds:.[MAR Hold] sodium chloride, [MAR Hold] acetaminophen, [MAR Hold] albuterol, [MAR Hold] clonazepam, [MAR Hold] hydrALAZINE, [MAR Hold] ondansetron **OR** [MAR Hold] ondansetron (ZOFRAN) IV, [MAR Hold] traMADol  SUBJECTIVE:   24 hour events:  No acute events noted TEE planned today Only able to tolerate part of MRI lumbar so no contrast done  Going for TEE today.  Wife at the bedside with several questions.   Review of Systems  All other systems reviewed and are negative.     OBJECTIVE:   Blood pressure 91/68, pulse 75, temperature 97.8 F (36.6 C), temperature source Temporal, resp. rate 19, height '5\' 8"'$  (1.727 m), weight 99.3 kg, SpO2 96 %. Body mass  index is 33.29 kg/m.  Physical Exam Constitutional:      Comments: Elderly man, lying in bed, working with nursing to get up to chair for TEE.  HENT:     Head: Normocephalic and atraumatic.  Eyes:     Extraocular Movements: Extraocular movements intact.     Conjunctiva/sclera: Conjunctivae normal.  Pulmonary:     Effort: Pulmonary effort is normal. No respiratory distress.  Abdominal:     General: There is no distension.     Palpations: Abdomen is soft.  Musculoskeletal:     Cervical back: Normal range  of motion and neck supple.     Right lower leg: No edema.     Left lower leg: No edema.  Skin:    General: Skin is warm and dry.     Findings: No rash.  Neurological:     General: No focal deficit present.     Mental Status: Mental status is at baseline.  Psychiatric:        Mood and Affect: Mood normal.        Behavior: Behavior normal.      Lab Results: Lab Results  Component Value Date   WBC 12.7 (H) 12/08/2021   HGB 12.8 (L) 12/08/2021   HCT 38.6 (L) 12/08/2021   MCV 94.8 12/08/2021   PLT 211 12/08/2021    Lab Results  Component Value Date   NA 136 12/08/2021   K 4.7 12/08/2021   CO2 32 12/08/2021   GLUCOSE 115 (H) 12/08/2021   BUN 20 12/08/2021   CREATININE 0.84 12/08/2021   CALCIUM 8.8 (L) 12/08/2021   GFRNONAA >60 12/08/2021   GFRAA 107 04/08/2020    Lab Results  Component Value Date   ALT 18 12/01/2021   AST 15 12/01/2021   ALKPHOS 68 12/01/2021   BILITOT 0.8 12/01/2021    No results found for: "CRP"     Component Value Date/Time   ESRSEDRATE 8 09/25/2019 1110     I have reviewed the micro and lab results in Epic.  Imaging: MR LUMBAR SPINE WO CONTRAST  Result Date: 12/07/2021 CLINICAL DATA:  Low back pain, infection suspected. 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 EXAM: MRI LUMBAR SPINE WITHOUT CONTRAST TECHNIQUE: Multiplanar, multisequence MR imaging of the lumbar spine was performed. No intravenous contrast was administered. COMPARISON:  CTA chest/abdomen/pelvis 11/30/2021, lumbar spine MRI 06/03/2020 FINDINGS: Segmentation: Standard; the lowest formed disc space is designated L5-S1. Alignment: There is exaggerated lumbar lordosis with stepwise grade 1 retrolisthesis of L1 on L2 through L3 on L4 and grade 1 anterolisthesis of L4 on L5 unchanged since the prior MRI from 2021. Vertebrae: Postsurgical changes reflecting posterior instrumented fusion and decompression from L2 through L5 are noted. Vertebral body heights are preserved. Background marrow signal is  normal. There is degenerative marrow signal abnormality from L1 through L5. There is T1 hypointensity with faint edema in the marrow along the L5-S1 disc space with fluid signal in the disc and mild prevertebral/presacral edema (6-12 6-7). There is advanced facet arthropathy with fluid in both facet joints at this level but no convincing evidence of septic arthritis within the confines of noncontrast technique. There is thin epidural fluid along the ventral aspect of the spinal canal at T12 through L3 (for example 8-1, 8-8, 8-14). At L5 and S1 there is a more prominent ventral epidural fluid collection measuring up to 1.0 cm in thickness suspicious for epidural abscess. Conus medullaris and cauda equina: Conus extends to the L1 level. Conus and cauda equina appear normal.  Paraspinal and other soft tissues: As above, there is prevertebral edema centered at L5-S1 extending along the ventral aspect of the sacrum. The paraspinal soft tissues are otherwise unremarkable. There is significant artifact due to the aortic stent graft. Disc levels: T12-L1: No significant spinal canal or neural foraminal stenosis L1-L2: There is marked disc desiccation and narrowing with a mild disc bulge but no significant spinal canal or neural foraminal stenosis L2-L3: Status post posterior instrumented fusion. There is mild degenerative endplate spurring without significant spinal canal or neural foraminal stenosis L3-L4: Status post posterior fusion and decompression. Degenerative endplate spurring and facet arthropathy results in moderate left worse than right neural foraminal stenosis without significant spinal canal stenosis. L4-L5: Status post posterior fusion and decompression. There is degenerative endplate change and facet arthropathy resulting in at least moderate bilateral neural foraminal stenosis left worse than right, without significant spinal canal stenosis L5-S1: There is advanced bilateral facet arthropathy with effusions  and degenerative endplate change with a mild bulge resulting in moderate to severe bilateral neural foraminal stenosis. There is severe spinal canal stenosis with compression of the cauda equina nerve roots due to the above described epidural fluid collection. IMPRESSION: 1. Epidural fluid collection along the ventral spinal canal extending from T12 through S1 most prominent at L5-S1 where it measures up to 1.0 cm in thickness raising suspicion for epidural abscess. 2. T1 hypointense marrow signal along the L5-S1 disc space with faint marrow edema raises suspicion for early discitis/osteomyelitis, but there is no progressed osseous destruction or irregularity at this time. There is associated mild prevertebral/presacral soft tissue edema. 3. Advanced facet arthropathy with bilateral effusions at L5-S1 without convincing evidence of septic arthritis. 4. Consider postcontrast imaging of the lumbar spine when the patient can tolerate for better evaluation of the above findings. 5. Postsurgical changes reflecting posterior fusion and decompression from L2 through L5 with moderate left worse than right neural foraminal stenosis at L3-L4, at least moderate bilateral neural foraminal stenosis at L4-L5, and moderate to severe bilateral neural foraminal stenosis at L5-S1. Electronically Signed   By: Valetta Mole M.D.   On: 12/07/2021 15:10   ECHOCARDIOGRAM COMPLETE  Result Date: 12/06/2021    ECHOCARDIOGRAM REPORT   Patient Name:   [facility] Date of Exam: 12/06/2021 Medical Rec #:  [id number]       Height:       68.0 in Accession #:    [id number]      Weight:       217.8 lb Date of Birth:  [birth year]      BSA:          2.119 m Patient Age:    67 years        BP:           156/73 mmHg Patient Gender: M               HR:           61 bpm. Exam Location:  Inpatient Procedure: 2D Echo, Cardiac Doppler and Color Doppler Indications:    Endocarditis  History:        Patient has prior history of Echocardiogram examinations,  most                 recent 07/15/2021. CAD, Stroke; Risk Factors:Hypertension.                 Aortic Valve: 29 mm Sapien prosthetic, stented (TAVR) valve is  present in the aortic position. Procedure Date: 06-07-21.  Sonographer:    Merrie Roof RDCS Referring Phys: RAI, RIPUDEEP, K IMPRESSIONS  1. TAVR valve vegetation - has TEE scheduled for furher clarification.  2. Left ventricular ejection fraction, by estimation, is 60 to 65%. The left ventricle has normal function. The left ventricle has no regional wall motion abnormalities. There is moderate left ventricular hypertrophy. Left ventricular diastolic parameters are consistent with Grade I diastolic dysfunction (impaired relaxation).  3. Right ventricular systolic function is normal. The right ventricular size is mildly enlarged.  4. Right atrial size was mildly dilated.  5. The mitral valve is normal in structure. No evidence of mitral valve regurgitation. No evidence of mitral stenosis.  6. There is a shaggy mobile echodensity on the ventricular surface of the TAVR valve concerning for vegetation. Has TEE scheduled on 12/08/21 for clarification. The aortic valve has been repaired/replaced. Aortic valve regurgitation is not visualized. No aortic stenosis is present. 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 Procedure Date: 06-07-21. Echo findings are consistent with vegetation of the aortic prosthesis. Aortic valve mean gradient measures 10.0 mmHg. Aortic valve Vmax measures 2.26 m/s.  7. The inferior vena cava is normal in size with greater than 50% respiratory variability, suggesting right atrial pressure of 3 mmHg. Conclusion(s)/Recommendation(s): TAVR valve vegetation - has TEE scheduled for furher clarification. FINDINGS  Left Ventricle: Left ventricular ejection fraction, by estimation, is 60 to 65%. The left ventricle has normal function. The left ventricle has no regional wall motion abnormalities. The  left ventricular internal cavity size was normal in size. There is  moderate left ventricular hypertrophy. Left ventricular diastolic parameters are consistent with Grade I diastolic dysfunction (impaired relaxation). Right Ventricle: The right ventricular size is mildly enlarged. No increase in right ventricular wall thickness. Right ventricular systolic function is normal. Left Atrium: Left atrial size was normal in size. Right Atrium: Right atrial size was mildly dilated. Pericardium: There is no evidence of pericardial effusion. Mitral Valve: The mitral valve is normal in structure. No evidence of mitral valve regurgitation. No evidence of mitral valve stenosis. Tricuspid Valve: The tricuspid valve is normal in structure. Tricuspid valve regurgitation is not demonstrated. No evidence of tricuspid stenosis. Aortic Valve: There is a shaggy mobile echodensity on the ventricular surface of the TAVR valve concerning for vegetation. Has TEE scheduled on 12/08/21 for clarification. The aortic valve has been repaired/replaced. Aortic valve regurgitation is not visualized. No aortic stenosis is present. Aortic valve mean gradient measures 10.0 mmHg. Aortic valve peak gradient measures 20.4 mmHg. Aortic valve area, by VTI measures 1.08 cm. 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 Procedure Date: 06-07-21. Pulmonic Valve: The pulmonic valve was normal in structure. Pulmonic valve regurgitation is not visualized. No evidence of pulmonic stenosis. Aorta: The aortic root is normal in size and structure. Venous: The inferior vena cava is normal in size with greater than 50% respiratory variability, suggesting right atrial pressure of 3 mmHg. IAS/Shunts: No atrial level shunt detected by color flow Doppler.  LEFT VENTRICLE PLAX 2D LVIDd:         5.10 cm   Diastology LVIDs:         3.30 cm   LV e' medial:    5.98 cm/s LV PW:         1.50 cm   LV E/e' medial:  13.7 LV IVS:  1.30 cm   LV e' lateral:   4.13 cm/s LVOT diam:     2.20 cm   LV E/e' lateral: 19.9 LV SV:         57 LV SV Index:   27 LVOT Area:     3.80 cm  RIGHT VENTRICLE RV Basal diam:  4.40 cm TAPSE (M-mode): 2.2 cm LEFT ATRIUM           Index        RIGHT ATRIUM           Index LA diam:      3.30 cm 1.56 cm/m   RA Area:     26.40 cm LA Vol (A4C): 96.8 ml 45.68 ml/m  RA Volume:   87.50 ml  41.29 ml/m  AORTIC VALVE AV Area (Vmax):    1.14 cm AV Area (Vmean):   1.15 cm AV Area (VTI):     1.08 cm AV Vmax:           226.00 cm/s AV Vmean:          145.000 cm/s AV VTI:            0.530 m AV Peak Grad:      20.4 mmHg AV Mean Grad:      10.0 mmHg LVOT Vmax:         67.50 cm/s LVOT Vmean:        43.900 cm/s LVOT VTI:          0.150 m LVOT/AV VTI ratio: 0.28  AORTA Ao Root diam: 3.60 cm MITRAL VALVE MV Area (PHT): 2.83 cm     SHUNTS MV Decel Time: 268 msec     Systemic VTI:  0.15 m MV E velocity: 82.10 cm/s   Systemic Diam: 2.20 cm MV A velocity: 114.00 cm/s MV E/A ratio:  0.72 Candee Furbish MD Electronically signed by Candee Furbish MD Signature Date/Time: 12/06/2021/5:48:51 PM    Final    CT MAXILLOFACIAL WO CONTRAST  Result Date: 12/06/2021 CLINICAL DATA:  Bacteremia, dental caries EXAM: CT MAXILLOFACIAL WITHOUT CONTRAST TECHNIQUE: Multidetector CT imaging of the maxillofacial structures was performed. Multiplanar CT image reconstructions were also generated. RADIATION DOSE REDUCTION: This exam was performed according to the departmental dose-optimization program which includes automated exposure control, adjustment of the mA and/or kV according to patient size and/or use of iterative reconstruction technique. COMPARISON:  CT head 03/07/2019.  CT head neck 04/30/2012 FINDINGS: Osseous: No acute maxillofacial bone fracture. Bony orbital walls are intact. Mandible intact. Temporomandibular joints are aligned without dislocation. Mild periapical lucency associated with the left first maxillary molar (series 8, image 64). No  additional significant periapical lucencies. Evaluation for dental caries is limited by artifact related to dental hardware. Orbits: Negative. No traumatic or inflammatory finding. Sinuses: There are a few scattered areas of mucosal thickening in the ethmoid air cells bilaterally. Paranasal sinuses and mastoid air cells are otherwise clear. No air-fluid levels. Soft tissues: No soft tissue swelling or fluid collections. No lymphadenopathy is evident. Limited intracranial: No significant or unexpected finding. IMPRESSION: 1. Mild periapical lucency associated with the left first maxillary molar. Evaluation for dental caries is limited by artifact related to dental hardware. No evidence of periapical abscess. 2. Minimal mucosal thickening within the ethmoid air cells. No evidence of odontogenic sinusitis. 3. No acute maxillofacial bone fracture. Electronically Signed   By: Davina Poke D.O.   On: 12/06/2021 14:01     Imaging independently reviewed in Epic.    Mitzi Hansen  [facility] [place] [phone] pager 12/08/2021, 11:00 AM

## 2021-12-08 NOTE — Progress Notes (Signed)
  Echocardiogram Echocardiogram Transesophageal has been performed.  [facility] 12/08/2021, 11:01 AM

## 2021-12-08 NOTE — Anesthesia Preprocedure Evaluation (Signed)
Anesthesia Evaluation  Patient identified by MRN, date of birth, ID band Patient awake    Reviewed: Allergy & Precautions, NPO status , Patient's Chart, lab work & pertinent test results  Airway Mallampati: II  TM Distance: >3 FB Neck ROM: Full    Dental no notable dental hx.    Pulmonary sleep apnea and Continuous Positive Airway Pressure Ventilation , COPD,  COPD inhaler, former smoker,    Pulmonary exam normal        Cardiovascular hypertension, Pt. on medications and Pt. on home beta blockers + CAD and + Peripheral Vascular Disease  Normal cardiovascular exam     Neuro/Psych PSYCHIATRIC DISORDERS Anxiety Depression TIACVA, No Residual Symptoms    GI/Hepatic negative GI ROS, Neg liver ROS,   Endo/Other  negative endocrine ROS  Renal/GU negative Renal ROS     Musculoskeletal  (+) Arthritis ,   Abdominal   Peds  Hematology  (+) Blood dyscrasia, anemia ,   Anesthesia Other Findings bacteremia  Reproductive/Obstetrics                             Anesthesia Physical Anesthesia Plan  ASA: 3  Anesthesia Plan: MAC   Post-op Pain Management:    Induction: Intravenous  PONV Risk Score and Plan: 2 and Propofol infusion and Treatment may vary due to age or medical condition  Airway Management Planned: Nasal Cannula  Additional Equipment:   Intra-op Plan:   Post-operative Plan:   Informed Consent: I have reviewed the patients History and Physical, chart, labs and discussed the procedure including the risks, benefits and alternatives for the proposed anesthesia with the patient or authorized representative who has indicated his/her understanding and acceptance.     Dental advisory given  Plan Discussed with: CRNA  Anesthesia Plan Comments:         Anesthesia Quick Evaluation

## 2021-12-08 NOTE — CV Procedure (Signed)
    TRANSESOPHAGEAL ECHOCARDIOGRAM   NAME:  [facility]    MRN: [id number] DOB:  Sep 12, 1945    ADMIT DATE: 11/30/2021  INDICATIONS: Bacteremia  PROCEDURE:   Informed consent was obtained prior to the procedure. The risks, benefits and alternatives for the procedure were discussed and the patient comprehended these risks.  Risks include, but are not limited to, cough, sore throat, vomiting, nausea, somnolence, esophageal and stomach trauma or perforation, bleeding, low blood pressure, aspiration, pneumonia, infection, trauma to the teeth and death.    Procedural time out performed. The oropharynx was anesthetized with viscous lidocaine.  Anesthesia was administered by the anaesthesilogy team to achieve and maintain moderate to deep conscious sedation.  The patient's heart rate, blood pressure, and oxygen saturation were monitored continuously during the procedure.  The transesophageal probe was inserted in the esophagus and stomach without difficulty and multiple views were obtained.   The patient tolerated the procedure well.  COMPLICATIONS:    There were no immediate complications.  KEY FINDINGS:  Normal LV EF, vegetation noted on the aortic valve - follow official report.  No left atrial thrombus. Full report to follow. Further management per primary team.   Berniece Salines, DO [place]  10:45 AM

## 2021-12-08 NOTE — Anesthesia Postprocedure Evaluation (Signed)
Anesthesia Post Note  Patient: [facility]  Procedure(s) Performed: TRANSESOPHAGEAL ECHOCARDIOGRAM (TEE)     Patient location during evaluation: Endoscopy Anesthesia Type: MAC Level of consciousness: awake Pain management: pain level controlled Vital Signs Assessment: post-procedure vital signs reviewed and stable Respiratory status: spontaneous breathing, nonlabored ventilation, respiratory function stable and patient connected to nasal cannula oxygen Cardiovascular status: stable and blood pressure returned to baseline Postop Assessment: no apparent nausea or vomiting Anesthetic complications: no   No notable events documented.  Last Vitals:  Vitals:   12/08/21 1120 12/08/21 1411  BP: 111/74 (!) 149/75  Pulse:  71  Resp: 17 17  Temp:  36.6 C  SpO2:  96%    Last Pain:  Vitals:   12/08/21 1411  TempSrc: Oral  PainSc:                  [name]

## 2021-12-09 ENCOUNTER — Inpatient Hospital Stay (HOSPITAL_COMMUNITY): Payer: Medicare Other

## 2021-12-09 ENCOUNTER — Encounter (HOSPITAL_COMMUNITY): Payer: Self-pay | Admitting: Cardiology

## 2021-12-09 DIAGNOSIS — Z952 Presence of prosthetic heart valve: Secondary | ICD-10-CM | POA: Diagnosis not present

## 2021-12-09 DIAGNOSIS — R7881 Bacteremia: Secondary | ICD-10-CM | POA: Diagnosis not present

## 2021-12-09 DIAGNOSIS — G4733 Obstructive sleep apnea (adult) (pediatric): Secondary | ICD-10-CM

## 2021-12-09 DIAGNOSIS — T826XXD Infection and inflammatory reaction due to cardiac valve prosthesis, subsequent encounter: Secondary | ICD-10-CM | POA: Diagnosis not present

## 2021-12-09 DIAGNOSIS — T826XXA Infection and inflammatory reaction due to cardiac valve prosthesis, initial encounter: Secondary | ICD-10-CM

## 2021-12-09 DIAGNOSIS — J441 Chronic obstructive pulmonary disease with (acute) exacerbation: Secondary | ICD-10-CM | POA: Diagnosis not present

## 2021-12-09 DIAGNOSIS — J189 Pneumonia, unspecified organism: Secondary | ICD-10-CM | POA: Diagnosis not present

## 2021-12-09 DIAGNOSIS — M25512 Pain in left shoulder: Secondary | ICD-10-CM

## 2021-12-09 DIAGNOSIS — G062 Extradural and subdural abscess, unspecified: Secondary | ICD-10-CM | POA: Diagnosis not present

## 2021-12-09 DIAGNOSIS — M4646 Discitis, unspecified, lumbar region: Secondary | ICD-10-CM | POA: Diagnosis not present

## 2021-12-09 LAB — CBC WITH DIFFERENTIAL/PLATELET
Abs Immature Granulocytes: 0.19 10*3/uL — ABNORMAL HIGH (ref 0.00–0.07)
Basophils Absolute: 0 10*3/uL (ref 0.0–0.1)
Basophils Relative: 0 %
Eosinophils Absolute: 0.1 10*3/uL (ref 0.0–0.5)
Eosinophils Relative: 1 %
HCT: 35.5 % — ABNORMAL LOW (ref 39.0–52.0)
Hemoglobin: 12 g/dL — ABNORMAL LOW (ref 13.0–17.0)
Immature Granulocytes: 2 %
Lymphocytes Relative: 9 %
Lymphs Abs: 0.9 10*3/uL (ref 0.7–4.0)
MCH: 32 pg (ref 26.0–34.0)
MCHC: 33.8 g/dL (ref 30.0–36.0)
MCV: 94.7 fL (ref 80.0–100.0)
Monocytes Absolute: 0.8 10*3/uL (ref 0.1–1.0)
Monocytes Relative: 8 %
Neutro Abs: 8.3 10*3/uL — ABNORMAL HIGH (ref 1.7–7.7)
Neutrophils Relative %: 80 %
Platelets: 210 10*3/uL (ref 150–400)
RBC: 3.75 MIL/uL — ABNORMAL LOW (ref 4.22–5.81)
RDW: 14.1 % (ref 11.5–15.5)
WBC: 10.3 10*3/uL (ref 4.0–10.5)
nRBC: 0 % (ref 0.0–0.2)

## 2021-12-09 LAB — BASIC METABOLIC PANEL
Anion gap: 7 (ref 5–15)
BUN: 15 mg/dL (ref 8–23)
CO2: 33 mmol/L — ABNORMAL HIGH (ref 22–32)
Calcium: 8.3 mg/dL — ABNORMAL LOW (ref 8.9–10.3)
Chloride: 95 mmol/L — ABNORMAL LOW (ref 98–111)
Creatinine, Ser: 0.72 mg/dL (ref 0.61–1.24)
GFR, Estimated: >60 mL/min (ref >60)
Glucose, Bld: 115 mg/dL — ABNORMAL HIGH (ref 70–99)
Potassium: 4.4 mmol/L (ref 3.5–5.1)
Sodium: 135 mmol/L (ref 135–145)

## 2021-12-09 LAB — MAGNESIUM: Magnesium: 2.2 mg/dL (ref 1.7–2.4)

## 2021-12-09 LAB — SYNOVIAL CELL COUNT + DIFF, W/ CRYSTALS
Crystals, Fluid: NONE SEEN
Eosinophils-Synovial: 0 % (ref 0–1)
Lymphocytes-Synovial Fld: 4 % (ref 0–20)
Monocyte-Macrophage-Synovial Fluid: 4 % — ABNORMAL LOW (ref 50–90)
Neutrophil, Synovial: 92 % — ABNORMAL HIGH (ref 0–25)
WBC, Synovial: 27500 /mm3 — ABNORMAL HIGH (ref 0–200)

## 2021-12-09 LAB — SUSCEPTIBILITY, AER + ANAEROB: Source of Sample: 8680

## 2021-12-09 LAB — SUSCEPTIBILITY RESULT

## 2021-12-09 LAB — GLUCOSE, CAPILLARY: Glucose-Capillary: 120 mg/dL — ABNORMAL HIGH (ref 70–99)

## 2021-12-09 MED ORDER — LORAZEPAM 0.5 MG PO TABS
0.5000 mg | ORAL_TABLET | Freq: Once | ORAL | Status: AC
  Administered 2021-12-09: 0.5 mg via ORAL
  Filled 2021-12-09: qty 1

## 2021-12-09 MED ORDER — LORAZEPAM 0.5 MG PO TABS
0.5000 mg | ORAL_TABLET | Freq: Once | ORAL | Status: AC | PRN
  Administered 2021-12-09: 0.5 mg via ORAL
  Filled 2021-12-09: qty 1

## 2021-12-09 MED ORDER — MORPHINE SULFATE (PF) 2 MG/ML IV SOLN
1.0000 mg | INTRAVENOUS | Status: DC | PRN
  Administered 2021-12-09 – 2021-12-15 (×3): 1 mg via INTRAVENOUS
  Filled 2021-12-09 (×4): qty 1

## 2021-12-09 MED ORDER — GADOBUTROL 1 MMOL/ML IV SOLN
10.0000 mL | Freq: Once | INTRAVENOUS | Status: AC | PRN
  Administered 2021-12-09: 10 mL via INTRAVENOUS

## 2021-12-09 MED ORDER — LIDOCAINE HCL (PF) 1 % IJ SOLN
5.0000 mL | Freq: Once | INTRAMUSCULAR | Status: AC
  Administered 2021-12-09: 5 mL via INTRADERMAL

## 2021-12-09 MED ORDER — GENTAMICIN SULFATE 40 MG/ML IJ SOLN
2.5000 mg/kg | INTRAVENOUS | Status: DC
  Administered 2021-12-09 – 2021-12-16 (×8): 200 mg via INTRAVENOUS
  Filled 2021-12-09 (×9): qty 5

## 2021-12-09 NOTE — Progress Notes (Addendum)
[facility]                                                                              Kyle Lewis, is a 76 y.o. 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  He was put on NRB and admitted   Assessment & Plan      Acute on chronic respiratory failure with hypoxia ([place]) likely due to Community-acquired pneumonia and COPD exacerbation -received abx, nebs, steroids, wheezing resolved, now back on baseline o2,   Gemella sanguinis bacteremia  TAVR endocarditis, cards to see Septic arthritis ?( left shoulder ), ortho to see Epidural abscess , seen by neurosurgery no surgical intervention recommended at this time CT maxillofacial showed no dental abscess -currently on  penicillin and gentamicin  per ID -Pain control, states Percocet works well  AAA repair -CTA noted no acute dissection or acute aortic process.  A tiny endoleak in the anterior endosac would be difficult to exclude, smaller than on previous imaging, potentially representing calcification, recommended follow-up -cardiology officially consulted     Accelerated hypertension -BP stable on current regimen    Hyperlipidemia LDL goal <70 -Continue statin   OSA (obstructive sleep apnea) -Continue CPAP    Tobacco use -Counseled smoking cessation     Left Renal cyst - incidentally seen on CT, recommended 3-6 month follow-up    Obesity Estimated body mass index is 33.25 kg/m as calculated from the following:   Height as of this  encounter: '5\' 8"'$  (1.727 m).   Weight as of this encounter: 99.2 kg.  OSA , reports use bipap at night at home, reports use home o2 prn at home  FTT; Reports baseline imbalance, use walker/cane  Code Status: Full code DVT Prophylaxis:  heparin injection 5,000 Units Start: 11/30/21 1630   Level of Care: Level of care: Progressive Family Communication:  wife at bedside on 7/6 Disposition Plan:     currently not ready to discharge, Plan fo SNF  eventually when medically ready     Procedures:  TEE  Consultants:   Infectious disease Cardiology Neurosurgery Ortho   Antimicrobials:   Anti-infectives (From admission, onward)    Start     Dose/Rate Route Frequency Ordered Stop   12/09/21 1800  gentamicin (GARAMYCIN) 200 mg in dextrose 5 % 50 mL IVPB        2.5 mg/kg  80.6 kg (Adjusted) 110 mL/hr over 30 Minutes Intravenous Every 24 hours 12/09/21 0853     12/05/21 1800  gentamicin (GARAMYCIN) 240 mg in dextrose 5 % 50 mL IVPB  Status:  Discontinued  3 mg/kg  80.6 kg (Adjusted) 112 mL/hr over 30 Minutes Intravenous Every 24 hours 12/05/21 1317 12/09/21 0853   12/05/21 1400  penicillin G potassium 12 Million Units in dextrose 5 % 500 mL continuous infusion        12 Million Units 41.7 mL/hr over 12 Hours Intravenous Every 12 hours 12/05/21 1305     12/02/21 1800  vancomycin (VANCOREADY) IVPB 1250 mg/250 mL  Status:  Discontinued        1,250 mg 166.7 mL/hr over 90 Minutes Intravenous Every 24 hours 12/01/21 1610 12/05/21 1305   12/01/21 1800  azithromycin (ZITHROMAX) tablet 500 mg        500 mg Oral Every evening 12/01/21 0832 12/04/21 1714   12/01/21 1700  vancomycin (VANCOREADY) IVPB 1750 mg/350 mL        1,750 mg 175 mL/hr over 120 Minutes Intravenous  Once 12/01/21 1610 12/01/21 2045   11/30/21 1630  Ampicillin-Sulbactam (UNASYN) 3 g in sodium chloride 0.9 % 100 mL IVPB  Status:  Discontinued        3 g 200 mL/hr over 30 Minutes Intravenous Every 6 hours 11/30/21  1621 12/05/21 1305   11/30/21 1630  azithromycin (ZITHROMAX) 500 mg in sodium chloride 0.9 % 250 mL IVPB  Status:  Discontinued        500 mg 250 mL/hr over 60 Minutes Intravenous Every 24 hours 11/30/21 1622 12/01/21 0832   11/30/21 0930  Ampicillin-Sulbactam (UNASYN) 3 g in sodium chloride 0.9 % 100 mL IVPB        3 g 200 mL/hr over 30 Minutes Intravenous  Once 11/30/21 0923 11/30/21 1120          Medications  amLODipine  10 mg Oral Daily   arformoterol  15 mcg Nebulization BID   atorvastatin  40 mg Oral Daily   budesonide (PULMICORT) nebulizer solution  0.25 mg Nebulization BID   finasteride  5 mg Oral Daily   gabapentin  300 mg Oral BID   guaiFENesin  400 mg Oral BID   heparin  5,000 Units Subcutaneous Q8H   metoprolol tartrate  25 mg Oral BID   nicotine  21 mg Transdermal Daily   senna-docusate  1 tablet Oral BID   sertraline  50 mg Oral Daily   tamsulosin  0.4 mg Oral QHS      Subjective:   Kyle Lewis was seen and examined today.   C/o back pain and left shoulder pain, reports percocet helped, had mri left shoulder done, not sure why mri spine was not done   Currently on 3liter oxygen, denies sob, no chest pain, reports chronic cough at baseline, no edema, no fever    Objective:   Vitals:   12/09/21 0511 12/09/21 0715 12/09/21 0800 12/09/21 1100  BP: 140/73 (!) 122/58    Pulse: 62   64  Resp: 16 19    Temp: 98.1 F (36.7 C)     TempSrc: Oral     SpO2:   96% 91%  Weight:      Height:        Intake/Output Summary (Last 24 hours) at 12/09/2021 1320 Last data filed at 12/09/2021 0958 Gross per 24 hour  Intake 984.93 ml  Output 1850 ml  Net -865.07 ml     Wt Readings from Last 3 Encounters:  12/09/21 99.2 kg  11/25/21 97.8 kg  11/09/21 97.8 kg    Physical Exam General: Alert and oriented x 3, NAD, slightly impaired hearing  Cardiovascular: S1  S2 clear, RRR. Respiratory: CTAB, no wheezing Gastrointestinal: Soft, nontender, nondistended,  NBS Ext: no pedal edema bilaterally, scatter ecchymosis on arms/legs, left shoulder range of motion limited due to pain Neuro: no new deficits Skin: rash on right upper chest, minimal rash on top of left foot, reports not pruritic, nontender   Data Reviewed:  I have personally reviewed following labs    CBC Lab Results  Component Value Date   WBC 10.3 12/09/2021   RBC 3.75 (L) 12/09/2021   HGB 12.0 (L) 12/09/2021   HCT 35.5 (L) 12/09/2021   MCV 94.7 12/09/2021   MCH 32.0 12/09/2021   PLT 210 12/09/2021   MCHC 33.8 12/09/2021   RDW 14.1 12/09/2021   LYMPHSABS 0.9 12/09/2021   MONOABS 0.8 12/09/2021   EOSABS 0.1 12/09/2021   BASOSABS 0.0 28/31/5176     Last metabolic panel Lab Results  Component Value Date   NA 135 12/09/2021   K 4.4 12/09/2021   CL 95 (L) 12/09/2021   CO2 33 (H) 12/09/2021   BUN 15 12/09/2021   CREATININE 0.72 12/09/2021   GLUCOSE 115 (H) 12/09/2021   GFRNONAA >60 12/09/2021   GFRAA 107 04/08/2020   CALCIUM 8.3 (L) 12/09/2021   PROT 5.8 (L) 12/01/2021   ALBUMIN 2.4 (L) 12/01/2021   LABGLOB 2.7 09/25/2019   AGRATIO 1.4 09/25/2019   BILITOT 0.8 12/01/2021   ALKPHOS 68 12/01/2021   AST 15 12/01/2021   ALT 18 12/01/2021   ANIONGAP 7 12/09/2021    CBG (last 3)  Recent Labs    12/07/21 0644 12/08/21 0702 12/09/21 0752  GLUCAP 121* 123* 120*        Florencia Reasons M.D. PhD FACP [facility] 12/09/2021, 1:20 PM  Available via Epic secure chat 7am-7pm After 7 pm, please refer to night coverage provider listed on amion.

## 2021-12-09 NOTE — Consult Note (Addendum)
Cardiology Consultation:   Patient ID: Kyle Lewis MRN: [id number]; DOB: [birth year]  Admit date: 11/30/2021 Date of Consult: 12/09/2021  PCP:  [name], MD   [facility] Providers Cardiologist:  Jenean Lindau, MD   { TVAR: Dr. Angelena Form & Dr. Cyndia Bent    Patient Profile:   Kyle Lewis is a 76 y.o. 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 Erlinda Hong.  Admitted for acute hypoxic respiratory failure which was multifactorial due to underlying COPD, aortic stenosis, and diastolic heart failure requiring IV diruesis. He was diagnosed with severe AS. [facility] 12/27 showed moderate CAD to be managed medically. He underwent successful TAVR with a 29 mm Edwards Sapien 3 THV via the TF approach on 06/07/21. Post op echo showed EF 60%, normally functioning TAVR with a mean gradient of 16.5 mmHg and no PVL as well as mild MR.   History of Present Illness:   Mr. Lohmeyer presented with worsening back pain. Admitted 6/28 for acute hypoxic respiratory failure 2nd to CAP and COPD exacerbation. Treated with broad spectrum antibiotic. Blood culture grow gemella sanguinous. Seen by ID and suspected due to TAVR endocarditis given poor dentition.   Echo 7/4 showed shaggy mobile echodensity on the ventricular surface of the  TAVR valve concerning for vegetation. LVEF was 60-65%.   TEE 7/6 with highly mobile mass on the base of the left coronary cusp  highly suspicious for a vegetation.  Seen by neurosurgery yesterday due to finding of epidural abscess ventrally that extends from T12-S1  but most prominent at L5-S1 causing his worsening back pain.   MR of left should concerning for septic arthritis and  orthopedic surgery consulted.  Underwent fluoroscopic guided left shoulder joint aspiration yielding 4 mL of blood-tinged fluid.  Scr 0.72 K 4.4 NA 135 Hgb 12  Patient reports rash all over his body since being on antibiotic.  No chest pain, orthopnea or PND.  His main problem is back pain.  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   Abnormal gait 07/31/2019   Abscess of left leg    Acute on chronic diastolic heart failure (HCC)    Acute respiratory failure with hypoxia and hypercapnia ([place]) 05/27/2021   HC03   06/14/21    =  31  - 06/23/2021   Walked 250 ft  at a slow pace with a cane. Complained of back and left knee hurting with lowest sat 89% so d/c'd 02    Adenomatous colon polyp    Adrenal mass ([place])    2 benign appearing left adrenal adenomas noted on 01/13/15 CT   Allergy    seasonal   Anxiety 04/08/2020   Arthritis    Blister of multiple sites of lower extremity 01/10/2018   Body mass index (BMI) 34.0-34.9, adult 04/07/2020   BPH with obstruction/lower urinary tract symptoms    Overview:  Probable based on symptoms   CAD (coronary artery disease) 02/01/2018   Carotid arterial disease ([place]) 05/23/2012   Carotid artery occlusion    Chronic back pain    Cigarette smoker 09/05/2018  COLONIC POLYPS, ADENOMATOUS, HX OF 03/17/2010   Qualifier: Diagnosis of  By: Nelson-Smith CMA (AAMA), Dottie     COPD GOLD II 04/07/2019   Quit smoking 03/2019 - PFT's  04/07/2019  FEV1 1.58 (57 % ) ratio 0.54  p 0 % improvement from saba p nothing prior to study with DLCO  15.22 (66%) corrects to 3.05 (74%)  for alv volume and FV curve concave classically     Degeneration of cervical intervertebral disc    Degeneration of lumbar intervertebral disc 06/14/2015   Depression 03/17/2010   Qualifier: Diagnosis of  By: Nelson-Smith CMA (AAMA), Dottie     Diverticulosis    DIVERTICULOSIS, COLON 03/17/2010   Qualifier: Diagnosis of   By: Harlon Ditty CMA (AAMA), Dottie     Dyspnea    Exogenous obesity 11/08/2013   Overview:  With a nine pound weight gain since his last visit   Heart murmur    History of back surgery    Rods and Screws in back   History of craniotomy 01/11/2018   History of left knee replacement    History of right MCA stroke 07/21/2014   Hyperlipidemia LDL goal <70 01/19/2018   Hypertension    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 Oneida Alar and 06/10/2015 last visit   Impairment of balance 10/27/2021   Inflammation of sacroiliac joint ([place]) 05/06/2019   Lower urinary tract symptoms (LUTS) 08/06/2017   Meningioma ([place])    Microscopic hematuria 11/08/2013   Mitral regurgitation 06/02/2021   Muscle weakness 10/27/2021   Neck pain 03/25/2019   Nocturia    OA (osteoarthritis) of knee 04/01/2012   OAB (overactive bladder) 01/19/2014   OSA (obstructive sleep apnea)    Bipap per Chodri since ? 2018 - Download 09/05/2018 used > 4 h x > 92% of days and avg use 8 h 34mn with AHI 3.1 @ 6 ipap and 10 epap    Osteoarthritis of right glenohumeral joint 08/14/2017   Other specified postprocedural states 09/25/2017   Peripheral vascular disease ([place]    Pre-procedure lab exam 01/19/2018   Preop cardiovascular exam 10/24/2018   Ringing in ear    ([place])   S/P lumbar spinal fusion 07/07/2015   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   s/p TAVR with a 29 mm Edwards S3U via the TF approach by Dr. MAngelena Form& Dr. BCyndia Bent  Severe aortic stenosis 02/01/2018   ECHO 01/30/18 - Left ventricle: The cavity size was normal. There was moderate   focal basal hypertrophy of the septum. Systolic function was   vigorous. The estimated ejection fraction was in the range of 65%   to 70%. Wall motion was normal; there were no regional wall   motion abnormalities. Doppler parameters are consistent with   abnormal left ventricular  relaxation (grade 1 diastolic   dysfun   Status post AAA (abdominal aortic aneurysm) repair 02/01/2018   St[place] ([place] 2013   tia no residual deficit from   Tobacco use 11/08/2013   Ventral hernia 11/08/2013   Overview:  S/p repair with alloderm mesh and s/p SB resection    Past Surgical History:  Procedure Laterality Date   ABDOMINAL AORTIC ANEURYSM REPAIR  2019   [place] as child   BACK SURGERY  2018   [place] Screws in [place]   [place]  CARPAL TUNNEL RELEASE     left   EYE SURGERY Bilateral    Cataract   IRRIGATION AND DEBRIDEMENT ABSCESS Left 08/25/2020   Procedure: IRRIGATION AND DEBRIDEMENT ABSCESS LEFT LEG;  Surgeon: Evelina Bucy, DPM;  Location: [facility];  Service: Podiatry;  Laterality: Left;   JOINT REPLACEMENT Left 04-01-12   Knee   LEFT HEART CATH AND CORONARY ANGIOGRAPHY N/A 09/15/2016   Procedure: Left Heart Cath and Coronary Angiography;  Surgeon: Nelva Bush, MD;  Location: [facility];  Service: Cardiovascular;  Laterality: N/A;   MAXIMUM ACCESS (MAS)POSTERIOR LUMBAR INTERBODY FUSION (PLIF) 1 LEVEL N/A 07/07/2015   Procedure:  POSTERIOR LUMBAR INTERBODY FUSION (PLIF) Lumbar Four-Five with Pedicle Screw Fixation Lumbar Two-Five;Laminectomy Lumbar Two-Five;  Surgeon: Eustace Moore, MD;  Location: [facility];  Service: Neurosurgery;  Laterality: N/A;   POSTERIOR LUMBAR INTERBODY FUSION (PLIF) Lumbar Four-Five with Pedicle Screw Fixation Lumbar Two-Five;Laminectomy Lumbar Two-Five   RIGHT/LEFT HEART CATH AND CORONARY ANGIOGRAPHY N/A 01/24/2018   Procedure: RIGHT/LEFT HEART CATH AND CORONARY ANGIOGRAPHY;  Surgeon: Nelva Bush, MD;  Location: [facility];  Service: Cardiovascular;  Laterality: N/A;   RIGHT/LEFT HEART CATH AND CORONARY ANGIOGRAPHY N/A 05/31/2021   Procedure: RIGHT/LEFT HEART CATH AND CORONARY ANGIOGRAPHY;  Surgeon: Burnell Blanks, MD;  Location:  [facility];  Service: Cardiovascular;  Laterality: N/A;   TEE WITHOUT CARDIOVERSION N/A 06/07/2021   Procedure: TRANSESOPHAGEAL ECHOCARDIOGRAM (TEE);  Surgeon: Burnell Blanks, MD;  Location: [facility];  Service: Open Heart Surgery;  Laterality: N/A;   TEE WITHOUT CARDIOVERSION N/A 12/08/2021   Procedure: TRANSESOPHAGEAL ECHOCARDIOGRAM (TEE);  Surgeon: Berniece Salines, DO;  Location: [facility];  Service: Cardiovascular;  Laterality: N/A;   TONSILLECTOMY  as child   TOTAL KNEE ARTHROPLASTY  04/01/2012   Procedure: TOTAL KNEE ARTHROPLASTY;  Surgeon: Gearlean Alf, MD;  Location: [facility];  Service: Orthopedics;  Laterality: Left;   TRANSCATHETER AORTIC VALVE REPLACEMENT, TRANSFEMORAL Bilateral 06/07/2021   Procedure: TRANSCATHETER AORTIC VALVE REPLACEMENT, RIGHT TRANSFEMORAL;  Surgeon: Burnell Blanks, MD;  Location: [facility];  Service: Open Heart Surgery;  Laterality: Bilateral;   TRANSURETHRAL RESECTION OF PROSTATE     ULTRASOUND GUIDANCE FOR VASCULAR ACCESS Bilateral 06/07/2021   Procedure: ULTRASOUND GUIDANCE FOR VASCULAR ACCESS;  Surgeon: Burnell Blanks, MD;  Location: [facility];  Service: Open Heart Surgery;  Laterality: Bilateral;   UMBILICAL HERNIA REPAIR  2011     Inpatient Medications: Scheduled Meds:  amLODipine  10 mg Oral Daily   arformoterol  15 mcg Nebulization BID   atorvastatin  40 mg Oral Daily   budesonide (PULMICORT) nebulizer solution  0.25 mg Nebulization BID   finasteride  5 mg Oral Daily   gabapentin  300 mg Oral BID   guaiFENesin  400 mg Oral BID   heparin  5,000 Units Subcutaneous Q8H   metoprolol tartrate  25 mg Oral BID   nicotine  21 mg Transdermal Daily   senna-docusate  1 tablet Oral BID   sertraline  50 mg Oral Daily   tamsulosin  0.4 mg Oral QHS   Continuous Infusions:  sodium chloride Stopped (12/09/21 0855)   gentamicin     penicillin G potassium 12 Million Units in dextrose 5 % 500 mL continuous infusion 41.7 mL/hr at 12/09/21 1400    PRN Meds: sodium chloride, acetaminophen, albuterol, hydrALAZINE, naLOXone (NARCAN)  injection, ondansetron **OR** ondansetron (ZOFRAN) IV, oxyCODONE-acetaminophen  Allergies:    Allergies  Allergen Reactions   Cefazolin  Rash and Other (See Comments)    The patient had surgery and was given cefazolin intraop. ~ 10 days later he developed a rash confirmed by biopsy to be consistent w/ drug eruption. We cannot know for sure, but this is the most likely agent.      Social History:   Social History   Socioeconomic History   Marital status: Married    Spouse name: [name]    Number of children: 2   Years of education: College    Highest education level: Not on file  Occupational History    Comment: Self employed Administrator retired  Tobacco Use   Smoking status: Former    Packs/day: 0.75    Years: 40.00    Total pack years: 30.00    Types: Cigarettes    Quit date: 05/05/2021    Years since quitting: 0.5   Smokeless tobacco: Never   Tobacco comments:    1/2 pack a day if that   Vaping Use   Vaping Use: Never used  Substance and Sexual Activity   Alcohol use: Yes    Comment: rare   Drug use: No   Sexual activity: Not Currently  Other Topics Concern   Not on file  Social History Narrative   Patient lives at home with his wife. Rhonda    Patient has a Financial risk analyst.    Patient has 2 sons.    Patient smokes a half pack a day.          Social Determinants of Health   Financial Resource Strain: Not on file  Food Insecurity: Not on file  Transportation Needs: Not on file  Physical Activity: Not on file  Stress: Not on file  Social Connections: Not on file  Intimate Partner Violence: Not on file    Family History:    Family History  Problem Relation Age of Onset   Heart disease Mother        Onset ~74 y/o   Hypertension Mother        Deceased from old age at 40   Hyperlipidemia Mother    Arthritis Mother    Diabetes Father        Deceased from old age  at 59   Heart attack Father    Heart disease Father        CABG at age 93   Dementia Father 23   Arthritis Father    Prostate cancer Maternal Grandfather    Colon cancer Neg Hx    Esophageal cancer Neg Hx    Rectal cancer Neg Hx    Stomach cancer Neg Hx      ROS:  Please see the history of present illness.  All other ROS reviewed and negative.     Physical Exam/Data:   Vitals:   12/09/21 0511 12/09/21 0715 12/09/21 0800 12/09/21 1100  BP: 140/73 (!) 122/58    Pulse: 62   64  Resp: 16 19    Temp: 98.1 F (36.7 C)     TempSrc: Oral     SpO2:   96% 91%  Weight:      Height:        Intake/Output Summary (Last 24 hours) at 12/09/2021 1546 Last data filed at 12/09/2021 1400 Gross per 24 hour  Intake 1542.34 ml  Output 2850 ml  Net -1307.66 ml      12/09/2021    5:00 AM 12/08/2021    9:30 AM 12/08/2021    5:00 AM  Last  3 Weights  Weight (lbs) 218 lb 11.1 oz 218 lb 14.7 oz 218 lb 14.7 oz  Weight (kg) 99.2 kg 99.3 kg 99.3 kg     Body mass index is 33.25 kg/m.  General: Ill-appearing elderly male in no acute distress HEENT: normal Neck: no JVD Vascular: No carotid bruits; Distal pulses 2+ bilaterally Cardiac:  normal S1, S2; RRR; no murmur  Lungs:  clear to auscultation bilaterally, no wheezing, rhonchi or rales  Abd: soft, nontender, no hepatomegaly  Ext: no edema Musculoskeletal:  No deformities Skin: warm and dry, rash  Neuro:   no focal abnormalities noted Psych:  Normal affect   EKG:  The EKG was personally reviewed and demonstrates:  Sinus rhythm, RBBB Telemetry:  Telemetry was personally reviewed and demonstrates: Normal sinus rhythm, ventricular bigeminy  Relevant CV Studies:  TEE 12/08/21 1. There is a highly mobile mass on the base of the left coronary cusp  highly suspicious for a vegetation. No appreciation of an abcess. Consider  cardiac CTA if need for further evaluation. The aortic valve has been  repaired/replaced. Aortic valve  regurgitation is  trivial. No aortic stenosis is present. There is a 29 mm  Edwards TAVR valve present in the aortic position.   2. Left ventricular ejection fraction, by estimation, is 60 to 65%. The  left ventricle has normal function.   3. Right ventricular systolic function is normal. The right ventricular  size is normal.   4. No left atrial/left atrial appendage thrombus was detected. The LAA  emptying velocity was 59 cm/s.   5. The mitral valve is normal in structure. Mild mitral valve  regurgitation.   6. Tricuspid leaflets appears to be mildly thickened.   7. There is mild (Grade II) layered plaque.   8. Mildly dilated pulmonary artery.   Conclusion(s)/Recommendation(s): Findings concerning for aortic valve  vegetation. Consider cardiac cta for further clarification.   Echo 12/06/21 1. TAVR valve vegetation - has TEE scheduled for furher clarification.   2. Left ventricular ejection fraction, by estimation, is 60 to 65%. The  left ventricle has normal function. The left ventricle has no regional  wall motion abnormalities. There is moderate left ventricular hypertrophy.  Left ventricular diastolic  parameters are consistent with Grade I diastolic dysfunction (impaired  relaxation).   3. Right ventricular systolic function is normal. The right ventricular  size is mildly enlarged.   4. Right atrial size was mildly dilated.   5. The mitral valve is normal in structure. No evidence of mitral valve  regurgitation. No evidence of mitral stenosis.   6. There is a shaggy mobile echodensity on the ventricular surface of the  TAVR valve concerning for vegetation. Has TEE scheduled on 12/08/21 for  clarification. The aortic valve has been repaired/replaced. Aortic valve  regurgitation is not visualized. No  aortic stenosis is present. There is a 29 mm Sapien prosthetic (TAVR)  valve present in the aortic position. Procedure Date: 06-07-21. Echo  findings are consistent with vegetation of the aortic  prosthesis. Aortic  valve mean gradient measures 10.0 mmHg.  Aortic valve Vmax measures 2.26 m/s.   7. The inferior vena cava is normal in size with greater than 50%  respiratory variability, suggesting right atrial pressure of 3 mmHg.   Conclusion(s)/Recommendation(s): TAVR valve vegetation - has TEE scheduled  for furher clarification.   RIGHT/LEFT HEART CATH AND CORONARY ANGIOGRAPHY  05/31/2021   Conclusion      Prox LAD lesion is 25% stenosed.   Dist Cx  lesion is 40% stenosed.   Ost 2nd Mrg to 2nd Mrg lesion is 70% stenosed.   Dist RCA lesion is 60% stenosed.   Ost RPDA to RPDA lesion is 40% stenosed.   The LAD is a large caliber vessel that courses to the apex. There is mild mid LAD stenosis The Circumflex is a large caliber vessel that gives off a small obtuse marginal branch then 2 moderate caliber obtuse marginal branches. The second obtuse marginal branch has a moderate stenosis, unchanged from last cath in 2019.  The RCA is a large dominant artery. The distal RCA has a moderate stenosis, unchanged from last cath in 2019.  Severe aortic stenosis (mean gradient 50.6 mmHg, 59 mmHg, AVA 0.93 cm2).    Recommendations: Will continue workup for TAVR. His right and left heart pressures are only mildly abnormal following inpatient diuresis. Continue diuresis. The structural heart team will provide a formal consultation regarding his candidacy for TAVR.  Laboratory Data:  High Sensitivity Troponin:   Recent Labs  Lab 11/30/21 1000 11/30/21 1939 11/30/21 2150  TROPONINIHS 21* 20* 21*     Chemistry Recent Labs  Lab 12/07/21 0942 12/08/21 0238 12/09/21 0304  NA 137 136 135  K 4.5 4.7 4.4  CL 96* 92* 95*  CO2 36* 32 33*  GLUCOSE 125* 115* 115*  BUN '16 20 15  '$ CREATININE 0.73 0.84 0.72  CALCIUM 8.7* 8.8* 8.3*  MG 2.3  --  2.2  GFRNONAA >60 >60 >60  ANIONGAP '5 12 7    '$ No results for input(s): "PROT", "ALBUMIN", "AST", "ALT", "ALKPHOS", "BILITOT" in the last 168  hours. Lipids No results for input(s): "CHOL", "TRIG", "HDL", "LABVLDL", "LDLCALC", "CHOLHDL" in the last 168 hours.  Hematology Recent Labs  Lab 12/07/21 0942 12/08/21 0238 12/09/21 0304  WBC 10.6* 12.7* 10.3  RBC 4.26 4.07* 3.75*  HGB 13.2 12.8* 12.0*  HCT 40.3 38.6* 35.5*  MCV 94.6 94.8 94.7  MCH 31.0 31.4 32.0  MCHC 32.8 33.2 33.8  RDW 13.8 14.0 14.1  PLT 212 211 210    Radiology/Studies:  DG FLUORO GUIDED NEEDLE PLC ASPIRATION/INJECTION LOC  Result Date: 12/09/2021 INDICATION: Left shoulder pain, concern for infection. EXAM: FLUORO GUIDED NEEDLE PLACEMENT AND/OR ASPIRATION COMPARISON:  MRI of the left shoulder 12/09/2021. MEDICATIONS: 1% lidocaine FLUOROSCOPY TIME:  42 seconds (1.70 mGy). COMPLICATIONS: None immediate TECHNIQUE: Soyla Dryer, NP obtained informed consent from the patient prior to the procedure. This process included a discussion of procedural risks. The patient was positioned supine on the fluoroscopy table and the left upper extremity was placed in a slight degree of external rotation. A skin entry site was determined under fluoroscopy and marked. A timeout was performed. The operator donned sterile gloves and a mask. The anterior aspect of the shoulder was prepped and draped in the usual sterile fashion. The overlying soft tissues were anesthetized with 1% lidocaine. Under intermittent fluoroscopic guidance, a 22 gauge spinal needle was advanced into the glenohumeral joint space. 4 mL of fluid was aspirated and sent to the laboratory for analysis. The needle was withdrawn and a dressing was placed at the skin entry site. The patient tolerated the procedure well without immediate post-procedure complication. The procedure was performed by Soyla Dryer, NP, and supervised and interpreted by Dr. Kellie Simmering. IMPRESSION: 1. Fluoroscopically-guided left shoulder joint aspiration yielding 4 mL of blood-tinged fluid. 2. No immediate post-procedure complication.  Electronically Signed   By: Kellie Simmering D.O.   On: 12/09/2021 15:26   MR Shoulder Left  W Wo Contrast  Result Date: 12/09/2021 CLINICAL DATA:  Septic arthritis suspected, shoulder. Muscle weakness. EXAM: MRI OF THE LEFT SHOULDER WITHOUT AND WITH CONTRAST TECHNIQUE: Multiplanar, multisequence MR imaging of the left shoulder was performed before and after the administration of intravenous contrast. CONTRAST:  37m GADAVIST GADOBUTROL 1 MMOL/ML IV SOLN COMPARISON:  Limited correlation made with chest CTA 11/30/2021. FINDINGS: Despite efforts by the technologist and patient, mild motion artifact is present on today's exam and could not be eliminated. This reduces exam sensitivity and specificity. Rotator cuff: Diffuse rotator cuff tendinosis, especially within the supraspinatus and infraspinatus tendons. Partial intrasubstance tearing of the distal infraspinatus tendon. No full-thickness tendon tear or tendon retraction. Muscles: No significant focal rotator cuff muscular atrophy identified. There is mild edema and enhancement throughout the rotator cuff musculature without focal intramuscular fluid collection. Biceps long head: Tendinosis of the intra-articular portion. The tendon is normally located in the bicipital groove. Acromioclavicular Joint: The acromion is type 2. There are moderate acromioclavicular degenerative changes without evidence of septic arthritis at the acromioclavicular joint. There is a small amount of fluid in the subacromial-subdeltoid bursa. There is diffuse enhancement throughout the bursa following contrast. Glenohumeral Joint: Moderate-sized complex shoulder joint effusion with most of the fluid anteriorly within the superior subscapularis recess. There is diffuse synovial enhancement of this joint fluid. Advanced glenohumeral degenerative changes with diffuse chondral thinning and osteophytes of the humeral head. There is remodeling of the glenoid with subchondral cyst formation  inferiorly. No suspicious erosions or osseous enhancement to suggest osteomyelitis. Labrum: Diffuse labral degeneration, especially posteriorly. Bones: As above, advanced glenohumeral degenerative changes. No evidence of acute fracture. No specific signs of osteomyelitis. Other: Peripheral to the joint, no definite fluid collections are identified. There is generalized soft tissue edema surrounding the shoulder. IMPRESSION: 1. Complex shoulder joint effusion with diffuse synovial enhancement, especially within the superior subscapularis recess. Findings are worrisome for septic arthritis or other inflammatory arthropathy. 2. No specific evidence of osteomyelitis. 3. Advanced glenohumeral degenerative changes with diffuse labral degeneration. 4. Diffuse rotator cuff tendinosis with partial infraspinatus tendon tearing. No full-thickness rotator cuff tear or focal muscular atrophy. Electronically Signed   By: WRichardean SaleM.D.   On: 12/09/2021 09:59   ECHO TEE  Result Date: 12/08/2021    TRANSESOPHOGEAL ECHO REPORT   Patient Name:   Kyle MCCARLEYDate of Exam: 12/08/2021 Medical Rec #:  [id number]      Height:       68.0 in Accession #:    [id number]     Weight:       218.9 lb Date of Birth:  [birth year]     BSA:          2.124 m Patient Age:    89years        BP:           91/42 mmHg Patient Gender: M               HR:           66 bpm. Exam Location:  Inpatient Procedure: Transesophageal Echo, Color Doppler, 3D Echo and Cardiac Doppler Indications:     bacteremia  History:         Patient has prior history of Echocardiogram examinations, most                  recent 12/06/2021. COPD; Risk Factors:Hypertension, Dyslipidemia,  Former Smoker and Sleep Apnea.                  Aortic Valve: 29 mm Edwards TAVR valve is present in the aortic                  position.  Sonographer:     Johny Chess RDCS Referring Phys:  [id number] Margie Billet Diagnosing Phys: Berniece Salines DO PROCEDURE: After  discussion of the risks and benefits of a TEE, an informed consent was obtained from the patient. The transesophogeal probe was passed without difficulty through the esophogus of the patient. Imaged were obtained with the patient in a left lateral decubitus position. Local oropharyngeal anesthetic was provided with viscous lidocaine. Sedation performed by different physician. Image quality was adequate. The patient developed no complications during the procedure. IMPRESSIONS  1. There is a highly mobile mass on the base of the left coronary cusp highly suspicious for a vegetation. No appreciation of an abcess. Consider cardiac CTA if need for further evaluation. The aortic valve has been repaired/replaced. Aortic valve regurgitation is trivial. No aortic stenosis is present. There is a 29 mm Edwards TAVR valve present in the aortic position.  2. Left ventricular ejection fraction, by estimation, is 60 to 65%. The left ventricle has normal function.  3. Right ventricular systolic function is normal. The right ventricular size is normal.  4. No left atrial/left atrial appendage thrombus was detected. The LAA emptying velocity was 59 cm/s.  5. The mitral valve is normal in structure. Mild mitral valve regurgitation.  6. Tricuspid leaflets appears to be mildly thickened.  7. There is mild (Grade II) layered plaque.  8. Mildly dilated pulmonary artery. Conclusion(s)/Recommendation(s): Findings concerning for aortic valve vegetation. Consider cardiac cta for further clarification. FINDINGS  Left Ventricle: Left ventricular ejection fraction, by estimation, is 60 to 65%. The left ventricle has normal function. The left ventricular internal cavity size was normal in size. Right Ventricle: The right ventricular size is normal. No increase in right ventricular wall thickness. Right ventricular systolic function is normal. Left Atrium: Left atrial size was not well visualized. No left atrial/left atrial appendage thrombus was  detected. The LAA emptying velocity was 59 cm/s. Right Atrium: Right atrial size was not well visualized. Mitral Valve: The mitral valve is normal in structure. Mild mitral valve regurgitation. Tricuspid Valve: Tricuspid leaflets appears to be mildly thickened. Tricuspid valve regurgitation is mild . No evidence of tricuspid stenosis. Aortic Valve: There is a highly mobile mass on the base of the left coronary cusp highly suspicious for a vegetation. No appreciation of an abcess. Consider cardiac CTA if need for further evaluation. The aortic valve has been repaired/replaced. Aortic valve regurgitation is trivial. No aortic stenosis is present. Aortic valve mean gradient measures 12.0 mmHg. Aortic valve peak gradient measures 23.4 mmHg. Aortic valve area, by VTI measures 1.75 cm. There is a 29 mm Edwards TAVR valve present in the aortic position. Pulmonic Valve: The pulmonic valve was normal in structure. Pulmonic valve regurgitation is not visualized. Aorta: The aortic root and ascending aorta are structurally normal, with no evidence of dilitation. There is mild (Grade II) layered plaque. Pulmonary Artery: The pulmonary artery is mildly dilated. Venous: The left upper pulmonary vein, left lower pulmonary vein, right upper pulmonary vein and right lower pulmonary vein are normal. A normal flow pattern is recorded from the left upper pulmonary vein. IAS/Shunts: No atrial level shunt detected by color flow Doppler.  LEFT VENTRICLE PLAX  2D LVOT diam:     2.10 cm LV SV:         76 LV SV Index:   36 LVOT Area:     3.46 cm  AORTIC VALVE AV Area (Vmax):    1.50 cm AV Area (Vmean):   1.24 cm AV Area (VTI):     1.75 cm AV Vmax:           242.00 cm/s AV Vmean:          157.000 cm/s AV VTI:            0.431 m AV Peak Grad:      23.4 mmHg AV Mean Grad:      12.0 mmHg LVOT Vmax:         105.00 cm/s LVOT Vmean:        56.400 cm/s LVOT VTI:          0.218 m LVOT/AV VTI ratio: 0.51  SHUNTS Systemic VTI:  0.22 m Systemic Diam:  2.10 cm Kardie Tobb DO Electronically signed by Berniece Salines DO Signature Date/Time: 12/08/2021/3:22:09 PM    Final    MR LUMBAR SPINE WO CONTRAST  Result Date: 12/07/2021 CLINICAL DATA:  Low back pain, infection suspected. 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 EXAM: MRI LUMBAR SPINE WITHOUT CONTRAST TECHNIQUE: Multiplanar, multisequence MR imaging of the lumbar spine was performed. No intravenous contrast was administered. COMPARISON:  CTA chest/abdomen/pelvis 11/30/2021, lumbar spine MRI 06/03/2020 FINDINGS: Segmentation: Standard; the lowest formed disc space is designated L5-S1. Alignment: There is exaggerated lumbar lordosis with stepwise grade 1 retrolisthesis of L1 on L2 through L3 on L4 and grade 1 anterolisthesis of L4 on L5 unchanged since the prior MRI from 2021. Vertebrae: Postsurgical changes reflecting posterior instrumented fusion and decompression from L2 through L5 are noted. Vertebral body heights are preserved. Background marrow signal is normal. There is degenerative marrow signal abnormality from L1 through L5. There is T1 hypointensity with faint edema in the marrow along the L5-S1 disc space with fluid signal in the disc and mild prevertebral/presacral edema (6-12 6-7). There is advanced facet arthropathy with fluid in both facet joints at this level but no convincing evidence of septic arthritis within the confines of noncontrast technique. There is thin epidural fluid along the ventral aspect of the spinal canal at T12 through L3 (for example 8-1, 8-8, 8-14). At L5 and S1 there is a more prominent ventral epidural fluid collection measuring up to 1.0 cm in thickness suspicious for epidural abscess. Conus medullaris and cauda equina: Conus extends to the L1 level. Conus and cauda equina appear normal. Paraspinal and other soft tissues: As above, there is prevertebral edema centered at L5-S1 extending along the ventral aspect of the sacrum. The paraspinal soft tissues are otherwise unremarkable. There  is significant artifact due to the aortic stent graft. Disc levels: T12-L1: No significant spinal canal or neural foraminal stenosis L1-L2: There is marked disc desiccation and narrowing with a mild disc bulge but no significant spinal canal or neural foraminal stenosis L2-L3: Status post posterior instrumented fusion. There is mild degenerative endplate spurring without significant spinal canal or neural foraminal stenosis L3-L4: Status post posterior fusion and decompression. Degenerative endplate spurring and facet arthropathy results in moderate left worse than right neural foraminal stenosis without significant spinal canal stenosis. L4-L5: Status post posterior fusion and decompression. There is degenerative endplate change and facet arthropathy resulting in at least moderate bilateral neural foraminal stenosis left worse than right, without significant spinal canal stenosis L5-S1: There is  advanced bilateral facet arthropathy with effusions and degenerative endplate change with a mild bulge resulting in moderate to severe bilateral neural foraminal stenosis. There is severe spinal canal stenosis with compression of the cauda equina nerve roots due to the above described epidural fluid collection. IMPRESSION: 1. Epidural fluid collection along the ventral spinal canal extending from T12 through S1 most prominent at L5-S1 where it measures up to 1.0 cm in thickness raising suspicion for epidural abscess. 2. T1 hypointense marrow signal along the L5-S1 disc space with faint marrow edema raises suspicion for early discitis/osteomyelitis, but there is no progressed osseous destruction or irregularity at this time. There is associated mild prevertebral/presacral soft tissue edema. 3. Advanced facet arthropathy with bilateral effusions at L5-S1 without convincing evidence of septic arthritis. 4. Consider postcontrast imaging of the lumbar spine when the patient can tolerate for better evaluation of the above  findings. 5. Postsurgical changes reflecting posterior fusion and decompression from L2 through L5 with moderate left worse than right neural foraminal stenosis at L3-L4, at least moderate bilateral neural foraminal stenosis at L4-L5, and moderate to severe bilateral neural foraminal stenosis at L5-S1. Electronically Signed   By: Valetta Mole M.D.   On: 12/07/2021 15:10   ECHOCARDIOGRAM COMPLETE  Result Date: 12/06/2021    ECHOCARDIOGRAM REPORT   Patient Name:   Kyle Lewis Date of Exam: 12/06/2021 Medical Rec #:  [id number]       Height:       68.0 in Accession #:    [id number]      Weight:       217.8 lb Date of Birth:  04/29/1946      BSA:          2.119 m Patient Age:    89 years        BP:           156/73 mmHg Patient Gender: M               HR:           61 bpm. Exam Location:  Inpatient Procedure: 2D Echo, Cardiac Doppler and Color Doppler Indications:    Endocarditis  History:        Patient has prior history of Echocardiogram examinations, most                 recent 07/15/2021. CAD, Stroke; Risk Factors:Hypertension.                 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 Procedure Date: 06-07-21.  Sonographer:    Merrie Roof RDCS Referring Phys: RAI, RIPUDEEP, K IMPRESSIONS  1. TAVR valve vegetation - has TEE scheduled for furher clarification.  2. Left ventricular ejection fraction, by estimation, is 60 to 65%. The left ventricle has normal function. The left ventricle has no regional wall motion abnormalities. There is moderate left ventricular hypertrophy. Left ventricular diastolic parameters are consistent with Grade I diastolic dysfunction (impaired relaxation).  3. Right ventricular systolic function is normal. The right ventricular size is mildly enlarged.  4. Right atrial size was mildly dilated.  5. The mitral valve is normal in structure. No evidence of mitral valve regurgitation. No evidence of mitral stenosis.  6.  There is a shaggy mobile echodensity on the ventricular surface of the TAVR valve concerning for vegetation. Has TEE scheduled on 12/08/21  for clarification. The aortic valve has been repaired/replaced. Aortic valve regurgitation is not visualized. No aortic stenosis is present. 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 Procedure Date: 06-07-21. Echo findings are consistent with vegetation of the aortic prosthesis. Aortic valve mean gradient measures 10.0 mmHg. Aortic valve Vmax measures 2.26 m/s.  7. The inferior vena cava is normal in size with greater than 50% respiratory variability, suggesting right atrial pressure of 3 mmHg. Conclusion(s)/Recommendation(s): TAVR valve vegetation - has TEE scheduled for furher clarification. FINDINGS  Left Ventricle: Left ventricular ejection fraction, by estimation, is 60 to 65%. The left ventricle has normal function. The left ventricle has no regional wall motion abnormalities. The left ventricular internal cavity size was normal in size. There is  moderate left ventricular hypertrophy. Left ventricular diastolic parameters are consistent with Grade I diastolic dysfunction (impaired relaxation). Right Ventricle: The right ventricular size is mildly enlarged. No increase in right ventricular wall thickness. Right ventricular systolic function is normal. Left Atrium: Left atrial size was normal in size. Right Atrium: Right atrial size was mildly dilated. Pericardium: There is no evidence of pericardial effusion. Mitral Valve: The mitral valve is normal in structure. No evidence of mitral valve regurgitation. No evidence of mitral valve stenosis. Tricuspid Valve: The tricuspid valve is normal in structure. Tricuspid valve regurgitation is not demonstrated. No evidence of tricuspid stenosis. Aortic Valve: There is a shaggy mobile echodensity on the ventricular surface of the TAVR valve concerning for vegetation. Has TEE scheduled on 12/08/21 for  clarification. The aortic valve has been repaired/replaced. Aortic valve regurgitation is not visualized. No aortic stenosis is present. Aortic valve mean gradient measures 10.0 mmHg. Aortic valve peak gradient measures 20.4 mmHg. Aortic valve area, by VTI measures 1.08 cm. 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 Procedure Date: 06-07-21. Pulmonic Valve: The pulmonic valve was normal in structure. Pulmonic valve regurgitation is not visualized. No evidence of pulmonic stenosis. Aorta: The aortic root is normal in size and structure. Venous: The inferior vena cava is normal in size with greater than 50% respiratory variability, suggesting right atrial pressure of 3 mmHg. IAS/Shunts: No atrial level shunt detected by color flow Doppler.  LEFT VENTRICLE PLAX 2D LVIDd:         5.10 cm   Diastology LVIDs:         3.30 cm   LV e' medial:    5.98 cm/s LV PW:         1.50 cm   LV E/e' medial:  13.7 LV IVS:        1.30 cm   LV e' lateral:   4.13 cm/s LVOT diam:     2.20 cm   LV E/e' lateral: 19.9 LV SV:         57 LV SV Index:   27 LVOT Area:     3.80 cm  RIGHT VENTRICLE RV Basal diam:  4.40 cm TAPSE (M-mode): 2.2 cm LEFT ATRIUM           Index        RIGHT ATRIUM           Index LA diam:      3.30 cm 1.56 cm/m   RA Area:     26.40 cm LA Vol (A4C): 96.8 ml 45.68 ml/m  RA Volume:   87.50 ml  41.29 ml/m  AORTIC VALVE AV Area (Vmax):    1.14 cm AV  Area (Vmean):   1.15 cm AV Area (VTI):     1.08 cm AV Vmax:           226.00 cm/s AV Vmean:          145.000 cm/s AV VTI:            0.530 m AV Peak Grad:      20.4 mmHg AV Mean Grad:      10.0 mmHg LVOT Vmax:         67.50 cm/s LVOT Vmean:        43.900 cm/s LVOT VTI:          0.150 m LVOT/AV VTI ratio: 0.28  AORTA Ao Root diam: 3.60 cm MITRAL VALVE MV Area (PHT): 2.83 cm     SHUNTS MV Decel Time: 268 msec     Systemic VTI:  0.15 m MV E velocity: 82.10 cm/s   Systemic Diam: 2.20 cm MV A velocity: 114.00 cm/s MV E/A ratio:   0.72 Candee Furbish MD Electronically signed by Candee Furbish MD Signature Date/Time: 12/06/2021/5:48:51 PM    Final    CT MAXILLOFACIAL WO CONTRAST  Result Date: 12/06/2021 CLINICAL DATA:  Bacteremia, dental caries EXAM: CT MAXILLOFACIAL WITHOUT CONTRAST TECHNIQUE: Multidetector CT imaging of the maxillofacial structures was performed. Multiplanar CT image reconstructions were also generated. RADIATION DOSE REDUCTION: This exam was performed according to the departmental dose-optimization program which includes automated exposure control, adjustment of the mA and/or kV according to patient size and/or use of iterative reconstruction technique. COMPARISON:  CT head 03/07/2019.  CT head neck 04/30/2012 FINDINGS: Osseous: No acute maxillofacial bone fracture. Bony orbital walls are intact. Mandible intact. Temporomandibular joints are aligned without dislocation. Mild periapical lucency associated with the left first maxillary molar (series 8, image 64). No additional significant periapical lucencies. Evaluation for dental caries is limited by artifact related to dental hardware. Orbits: Negative. No traumatic or inflammatory finding. Sinuses: There are a few scattered areas of mucosal thickening in the ethmoid air cells bilaterally. Paranasal sinuses and mastoid air cells are otherwise clear. No air-fluid levels. Soft tissues: No soft tissue swelling or fluid collections. No lymphadenopathy is evident. Limited intracranial: No significant or unexpected finding. IMPRESSION: 1. Mild periapical lucency associated with the left first maxillary molar. Evaluation for dental caries is limited by artifact related to dental hardware. No evidence of periapical abscess. 2. Minimal mucosal thickening within the ethmoid air cells. No evidence of odontogenic sinusitis. 3. No acute maxillofacial bone fracture. Electronically Signed   By: Davina Poke D.O.   On: 12/06/2021 14:01   MR THORACIC SPINE WO CONTRAST  Result Date:  12/05/2021 CLINICAL DATA:  Mid back pain EXAM: MRI THORACIC SPINE WITHOUT CONTRAST TECHNIQUE: Multiplanar, multisequence MR imaging of the thoracic spine was performed. No intravenous contrast was administered. COMPARISON:  None Available. FINDINGS: Alignment:  Physiologic. Vertebrae: No fracture, evidence of discitis, or bone lesion. Cord:  Normal signal and morphology. Paraspinal and other soft tissues: Negative. Disc levels: There is no spinal canal or neural foraminal stenosis. IMPRESSION: No discitis-osteomyelitis or epidural collection. Electronically Signed   By: Ulyses Jarred M.D.   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 -Echo 7/4 showed shaggy mobile echodensity on the ventricular surface of the  TAVR valve concerning for vegetation. LVEF was 60-65%.  -TEE 7/6 with highly mobile mass on the base of the left coronary cusp  highly  suspicious for a vegetation - Denies any dental cleaning since TVAR -Likely he needs removal of all his teeth at some point  -Antibiotic per ID  2.  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  Possible left shoulder septic arthritis -Underwent fluoroscopic guided left shoulder joint aspiration yielding 4 mL of blood-tinged fluid.  4. Non obstructive CAD - Cath 05/2021 with 25% pLAD; 40% DCx; 70% 2nd Mrg; 60% dRCA & 40% RPDA - No chest pain  - Continue statin and BB - Resume ASA when able   For questions or updates, please contact [facility] Please consult [email or web address] for contact info under    Jarrett Soho, PA  12/09/2021 3:46 PM   Personally seen and examined. Agree with APP above with the following comments:  Briefly CAD without prior PCI, known carotid disease, prior CVA, COPD on Home O2 complicated by continued tobacco abuse, OSA on CPAP, with severe AS s/p TAVR 2023.    Originally admitted for  SOB and back pain 11/2021.  Found to have bacteremia.  Given concerns for oral flora and poor dentition (patient denies and notes the floss picks he has at bedside), there were concerns for oral etiology of bacteremia.  Found to have Septic arthritis and had shoulder aspirate.  Has ne epidural abscesses.  As part of work up had highy mobile leaflet mass seen in 7/4 and 7/6 imaging concerning for leaflet tip vegetation.  Patient notes that he feels a lot of join pain in his hip and shoulder and back.  No CP, Sob, palpitations or syncope  Exam notable for no murmur, ill appearing, painful joints and new rash.  Otherwise as above  EKG SR RBBB and LAFB with significant artifact no 1st HB Tele: SR with runs of bigeminty Personally reviewed relevant tests; Unable to appreciate lesion on 11/30/21 CT Has Aorto illiac grafter for prior AAA; there is no graft leak noted- this is a non gated study and would be hard to exclude.  Would recommend  I am concerned that patient will not be a candidate for cardiac surgical interventions.  Will need prolonged antibiotics given his multiple sources of infection.  Cohort C patient. I think retrospective Cardiac CT on Monday would be reasonable to full characterize the infection; I do not see root abscess (more common in SAVR) or pseudoaneursm on present imaging. - presently without significant prosthetic valve dysfunction Will follow  Rudean Haskell, MD [place]  [address] [place], [place] [address] [phone]  5:19 PM

## 2021-12-09 NOTE — Progress Notes (Addendum)
[facility]  Date of Admission:  11/30/2021           Reason for visit: Follow up on TAVR endocarditis  Current antibiotics: Penicillin Gentamicin  ASSESSMENT:    76 y.o. male admitted with:  Gemella sanguinis bacteremia and TAVR endocarditis: Patient blood cultures from 11/30/21 are positive in 4 of 4 bottles with Gemella.  Susceptibility report has been sent to [place] and is pending.  Source suspected to be odontogenic from poor dentition.  TEE done yesterday mobile mass highly suspicious for vegetation.  Cardiac CTA could be considered for further evaluation. 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  MRI with and without contrast ordered, however, patient only able to tolerate portion of imaging without contrast showing the above abnormalities.  NSGY has seen patient and writes today that no intervention planned. History of AS s/p TAVR: Completed in January 2023. History of AAA: s/p endovascular repair in January 2019. Left shoulder pain:   MRI with and without contrast to further evaluate done earlier today.  RECOMMENDATIONS:    This seems most c/w TAVR endocarditis.  Will have cardiology formally see for any further recommendations on imaging or intervention Continue penicillin and gentamicin.  Pharmacy helping to dose the antibiotics Lab monitoring Follow cultures Follow up MRI studies.  May need orthopedics to see pending MRI results from shoulder today Will follow.  Dr Gale Journey available if needed over the weekend.  I will be back Monday.   Principal Problem:   Prosthetic valve endocarditis (HCC) Active Problems:   OSA (obstructive sleep apnea)   Tobacco use   Hyperlipidemia LDL goal <70   S/P TAVR (transcatheter aortic valve replacement)   CAP (community acquired pneumonia)   Accelerated hypertension   Acute  respiratory failure with hypoxia (HCC)   COPD with acute exacerbation (HCC)    MEDICATIONS:    Scheduled Meds:  amLODipine  10 mg Oral Daily   arformoterol  15 mcg Nebulization BID   atorvastatin  40 mg Oral Daily   budesonide (PULMICORT) nebulizer solution  0.25 mg Nebulization BID   finasteride  5 mg Oral Daily   gabapentin  300 mg Oral BID   guaiFENesin  400 mg Oral BID   heparin  5,000 Units Subcutaneous Q8H   metoprolol tartrate  25 mg Oral BID   nicotine  21 mg Transdermal Daily   senna-docusate  1 tablet Oral BID   sertraline  50 mg Oral Daily   tamsulosin  0.4 mg Oral QHS   Continuous Infusions:  sodium chloride 10 mL/hr at 12/09/21 0200   gentamicin     penicillin G potassium 12 Million Units in dextrose 5 % 500 mL continuous infusion 12 Million Units (12/09/21 0843)   PRN Meds:.sodium chloride, acetaminophen, albuterol, hydrALAZINE, naLOXone (NARCAN)  injection, ondansetron **OR** ondansetron (ZOFRAN) IV, oxyCODONE-acetaminophen  SUBJECTIVE:   24 hour events:  No acute events TEE done yesterday MRI shoulder done this morning Labs stable  Pt reports no new complaints Back pain is the same Reports shoulder pain for about 8 weeks that acutely worsened the past 3-4 weeks.  Review of Systems  All other systems reviewed and are negative.     OBJECTIVE:   Blood pressure (!) 122/58, pulse 62, temperature 98.1 F (36.7 C), temperature source Oral, resp. rate 19, height '5\' 8"'$  (1.727 m), weight 99.2 kg,  SpO2 96 %. Body mass index is 33.25 kg/m.  Physical Exam Constitutional:      General: He is not in acute distress.    Appearance: Normal appearance.  HENT:     Head: Normocephalic and atraumatic.  Pulmonary:     Effort: Pulmonary effort is normal. No respiratory distress.  Abdominal:     General: There is no distension.     Palpations: Abdomen is soft.  Musculoskeletal:     Cervical back: Normal range of motion and neck supple.     Comments: Left  shoulder decreased ROM  Skin:    General: Skin is warm and dry.     Findings: Rash present.     Comments: Rash on upper chest is stable.  Neurological:     General: No focal deficit present.     Mental Status: He is alert and oriented to person, place, and time.  Psychiatric:        Mood and Affect: Mood normal.        Behavior: Behavior normal.      Lab Results: Lab Results  Component Value Date   WBC 10.3 12/09/2021   HGB 12.0 (L) 12/09/2021   HCT 35.5 (L) 12/09/2021   MCV 94.7 12/09/2021   PLT 210 12/09/2021    Lab Results  Component Value Date   NA 135 12/09/2021   K 4.4 12/09/2021   CO2 33 (H) 12/09/2021   GLUCOSE 115 (H) 12/09/2021   BUN 15 12/09/2021   CREATININE 0.72 12/09/2021   CALCIUM 8.3 (L) 12/09/2021   GFRNONAA >60 12/09/2021   GFRAA 107 04/08/2020    Lab Results  Component Value Date   ALT 18 12/01/2021   AST 15 12/01/2021   ALKPHOS 68 12/01/2021   BILITOT 0.8 12/01/2021    No results found for: "CRP"     Component Value Date/Time   ESRSEDRATE 8 09/25/2019 1110     I have reviewed the micro and lab results in Epic.  Imaging: ECHO TEE  Result Date: 12/08/2021    TRANSESOPHOGEAL ECHO REPORT   Patient Name:   Kyle Lewis Date of Exam: 12/08/2021 Medical Rec #:  [id number]       Height:       68.0 in Accession #:    [id number]      Weight:       218.9 lb Date of Birth:  [birth year]      BSA:          2.124 m Patient Age:    38 years        BP:           91/42 mmHg Patient Gender: M               HR:           66 bpm. Exam Location:  Inpatient Procedure: Transesophageal Echo, Color Doppler, 3D Echo and Cardiac Doppler Indications:     bacteremia  History:         Patient has prior history of Echocardiogram examinations, most                  recent 12/06/2021. COPD; Risk Factors:Hypertension, Dyslipidemia,                  Former Smoker and Sleep Apnea.                  Aortic Valve: 29 mm Edwards TAVR valve is present in the aortic  position.  Sonographer:     Johny Chess RDCS Referring Phys:  [id number] Margie Billet Diagnosing Phys: Berniece Salines DO PROCEDURE: After discussion of the risks and benefits of a TEE, an informed consent was obtained from the patient. The transesophogeal probe was passed without difficulty through the esophogus of the patient. Imaged were obtained with the patient in a left lateral decubitus position. Local oropharyngeal anesthetic was provided with viscous lidocaine. Sedation performed by different physician. Image quality was adequate. The patient developed no complications during the procedure. IMPRESSIONS  1. There is a highly mobile mass on the base of the left coronary cusp highly suspicious for a vegetation. No appreciation of an abcess. Consider cardiac CTA if need for further evaluation. The aortic valve has been repaired/replaced. Aortic valve regurgitation is trivial. No aortic stenosis is present. There is a 29 mm Edwards TAVR valve present in the aortic position.  2. Left ventricular ejection fraction, by estimation, is 60 to 65%. The left ventricle has normal function.  3. Right ventricular systolic function is normal. The right ventricular size is normal.  4. No left atrial/left atrial appendage thrombus was detected. The LAA emptying velocity was 59 cm/s.  5. The mitral valve is normal in structure. Mild mitral valve regurgitation.  6. Tricuspid leaflets appears to be mildly thickened.  7. There is mild (Grade II) layered plaque.  8. Mildly dilated pulmonary artery. Conclusion(s)/Recommendation(s): Findings concerning for aortic valve vegetation. Consider cardiac cta for further clarification. FINDINGS  Left Ventricle: Left ventricular ejection fraction, by estimation, is 60 to 65%. The left ventricle has normal function. The left ventricular internal cavity size was normal in size. Right Ventricle: The right ventricular size is normal. No increase in right ventricular wall thickness. Right ventricular  systolic function is normal. Left Atrium: Left atrial size was not well visualized. No left atrial/left atrial appendage thrombus was detected. The LAA emptying velocity was 59 cm/s. Right Atrium: Right atrial size was not well visualized. Mitral Valve: The mitral valve is normal in structure. Mild mitral valve regurgitation. Tricuspid Valve: Tricuspid leaflets appears to be mildly thickened. Tricuspid valve regurgitation is mild . No evidence of tricuspid stenosis. Aortic Valve: There is a highly mobile mass on the base of the left coronary cusp highly suspicious for a vegetation. No appreciation of an abcess. Consider cardiac CTA if need for further evaluation. The aortic valve has been repaired/replaced. Aortic valve regurgitation is trivial. No aortic stenosis is present. Aortic valve mean gradient measures 12.0 mmHg. Aortic valve peak gradient measures 23.4 mmHg. Aortic valve area, by VTI measures 1.75 cm. There is a 29 mm Edwards TAVR valve present in the aortic position. Pulmonic Valve: The pulmonic valve was normal in structure. Pulmonic valve regurgitation is not visualized. Aorta: The aortic root and ascending aorta are structurally normal, with no evidence of dilitation. There is mild (Grade II) layered plaque. Pulmonary Artery: The pulmonary artery is mildly dilated. Venous: The left upper pulmonary vein, left lower pulmonary vein, right upper pulmonary vein and right lower pulmonary vein are normal. A normal flow pattern is recorded from the left upper pulmonary vein. IAS/Shunts: No atrial level shunt detected by color flow Doppler.  LEFT VENTRICLE PLAX 2D LVOT diam:     2.10 cm LV SV:         76 LV SV Index:   36 LVOT Area:     3.46 cm  AORTIC VALVE AV Area (Vmax):    1.50 cm AV Area (Vmean):   1.24 cm  AV Area (VTI):     1.75 cm AV Vmax:           242.00 cm/s AV Vmean:          157.000 cm/s AV VTI:            0.431 m AV Peak Grad:      23.4 mmHg AV Mean Grad:      12.0 mmHg LVOT Vmax:          105.00 cm/s LVOT Vmean:        56.400 cm/s LVOT VTI:          0.218 m LVOT/AV VTI ratio: 0.51  SHUNTS Systemic VTI:  0.22 m Systemic Diam: 2.10 cm Kardie Tobb DO Electronically signed by Berniece Salines DO Signature Date/Time: 12/08/2021/3:22:09 PM    Final    MR LUMBAR SPINE WO CONTRAST  Result Date: 12/07/2021 CLINICAL DATA:  Low back pain, infection suspected. 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 EXAM: MRI LUMBAR SPINE WITHOUT CONTRAST TECHNIQUE: Multiplanar, multisequence MR imaging of the lumbar spine was performed. No intravenous contrast was administered. COMPARISON:  CTA chest/abdomen/pelvis 11/30/2021, lumbar spine MRI 06/03/2020 FINDINGS: Segmentation: Standard; the lowest formed disc space is designated L5-S1. Alignment: There is exaggerated lumbar lordosis with stepwise grade 1 retrolisthesis of L1 on L2 through L3 on L4 and grade 1 anterolisthesis of L4 on L5 unchanged since the prior MRI from 2021. Vertebrae: Postsurgical changes reflecting posterior instrumented fusion and decompression from L2 through L5 are noted. Vertebral body heights are preserved. Background marrow signal is normal. There is degenerative marrow signal abnormality from L1 through L5. There is T1 hypointensity with faint edema in the marrow along the L5-S1 disc space with fluid signal in the disc and mild prevertebral/presacral edema (6-12 6-7). There is advanced facet arthropathy with fluid in both facet joints at this level but no convincing evidence of septic arthritis within the confines of noncontrast technique. There is thin epidural fluid along the ventral aspect of the spinal canal at T12 through L3 (for example 8-1, 8-8, 8-14). At L5 and S1 there is a more prominent ventral epidural fluid collection measuring up to 1.0 cm in thickness suspicious for epidural abscess. Conus medullaris and cauda equina: Conus extends to the L1 level. Conus and cauda equina appear normal. Paraspinal and other soft tissues: As above, there is  prevertebral edema centered at L5-S1 extending along the ventral aspect of the sacrum. The paraspinal soft tissues are otherwise unremarkable. There is significant artifact due to the aortic stent graft. Disc levels: T12-L1: No significant spinal canal or neural foraminal stenosis L1-L2: There is marked disc desiccation and narrowing with a mild disc bulge but no significant spinal canal or neural foraminal stenosis L2-L3: Status post posterior instrumented fusion. There is mild degenerative endplate spurring without significant spinal canal or neural foraminal stenosis L3-L4: Status post posterior fusion and decompression. Degenerative endplate spurring and facet arthropathy results in moderate left worse than right neural foraminal stenosis without significant spinal canal stenosis. L4-L5: Status post posterior fusion and decompression. There is degenerative endplate change and facet arthropathy resulting in at least moderate bilateral neural foraminal stenosis left worse than right, without significant spinal canal stenosis L5-S1: There is advanced bilateral facet arthropathy with effusions and degenerative endplate change with a mild bulge resulting in moderate to severe bilateral neural foraminal stenosis. There is severe spinal canal stenosis with compression of the cauda equina nerve roots due to the above described epidural fluid collection. IMPRESSION: 1. Epidural fluid collection along the  ventral spinal canal extending from T12 through S1 most prominent at L5-S1 where it measures up to 1.0 cm in thickness raising suspicion for epidural abscess. 2. T1 hypointense marrow signal along the L5-S1 disc space with faint marrow edema raises suspicion for early discitis/osteomyelitis, but there is no progressed osseous destruction or irregularity at this time. There is associated mild prevertebral/presacral soft tissue edema. 3. Advanced facet arthropathy with bilateral effusions at L5-S1 without convincing  evidence of septic arthritis. 4. Consider postcontrast imaging of the lumbar spine when the patient can tolerate for better evaluation of the above findings. 5. Postsurgical changes reflecting posterior fusion and decompression from L2 through L5 with moderate left worse than right neural foraminal stenosis at L3-L4, at least moderate bilateral neural foraminal stenosis at L4-L5, and moderate to severe bilateral neural foraminal stenosis at L5-S1. Electronically Signed   By: Valetta Mole M.D.   On: 12/07/2021 15:10     Imaging independently reviewed in Epic.    [facility] [phone] pager 12/09/2021, 9:11 AM

## 2021-12-09 NOTE — Progress Notes (Addendum)
Pharmacy Antibiotic Note  Kyle Lewis is a 76 y.o. 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 SCr stable at 0.72. Gent trough drawn yesterday was 1 drawn about 24 hours after the last dose.   Plan: Penicillin G 12 million units every 12 hours Reduce Gentamicin to 200 mg daily  Would ideally like trough to be < 1 for synergy  Monitor renal function, trough levels as appropriate and gemella sensitivities if available  Will repeat trough early  next week    Height: '5\' 8"'$  (172.7 cm) Weight: 99.2 kg (218 lb 11.1 oz) IBW/kg (Calculated) : 68.4  Temp (24hrs), Avg:98.1 F (36.7 C), Min:97.8 F (36.6 C), Max:98.6 F (37 C)  Recent Labs  Lab 12/05/21 0540 12/06/21 0119 12/07/21 0942 12/08/21 0238 12/08/21 1801 12/09/21 0304  WBC  --   --  10.6* 12.7*  --  10.3  CREATININE 0.76 0.89 0.73 0.84  --  0.72  GENTTROUGH  --   --   --   --  1.0  --      Estimated Creatinine Clearance: 91.1 mL/min (by C-G formula based on SCr of 0.72 mg/dL).    Allergies  Allergen Reactions   Cefazolin Rash and Other (See Comments)    The patient had surgery and was given cefazolin intraop. ~ 10 days later he developed a rash confirmed by biopsy to be consistent w/ drug eruption. We cannot know for sure, but this is the most likely agent.       Thank you for allowing pharmacy to be a part of this patient's care.  Jimmy Footman, PharmD, BCPS, BCIDP Infectious Diseases Clinical Pharmacist Phone: [phone] 12/09/2021 8:22 AM

## 2021-12-09 NOTE — Consult Note (Signed)
Reason for Consult:Left shoulder pain Referring Physician: Florencia Reasons Time called: 1301 Time at bedside: 1318   Kyle Lewis is an 76 y.o. male.  HPI: Kyle Lewis was admitted about 10d ago with LBD. 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 He has had left shoulder pain for 2-3 months but it got especially bad 2-3 weeks ago. MRI was done that was suspicious for septic joint and orthopedic surgery was consulted.  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   Abnormal gait 07/31/2019   Abscess of left leg    Acute on chronic diastolic heart failure (HCC)    Acute respiratory failure with hypoxia and hypercapnia ([place]) 05/27/2021   HC03   06/14/21    =  31  - 06/23/2021   Walked 250 ft  at a slow pace with a cane. Complained of back and left knee hurting with lowest sat 89% so d/c'd 02    Adenomatous colon polyp    Adrenal mass ([place])    2 benign appearing left adrenal adenomas noted on 01/13/15 CT   Allergy    seasonal   Anxiety 04/08/2020   Arthritis    Blister of multiple sites of lower extremity 01/10/2018   Body mass index (BMI) 34.0-34.9, adult 04/07/2020   BPH with obstruction/lower urinary tract symptoms    Overview:  Probable based on symptoms   CAD (coronary artery disease) 02/01/2018   Carotid arterial disease ([place]) 05/23/2012   Carotid artery occlusion    Chronic back pain    Cigarette smoker 09/05/2018   COLONIC POLYPS, ADENOMATOUS, HX OF 03/17/2010   Qualifier: Diagnosis of  By: Harlon Ditty CMA (AAMA), Dottie     COPD GOLD II 04/07/2019   Quit smoking 03/2019 - PFT's  04/07/2019  FEV1 1.58 (57 % ) ratio 0.54  p 0 % improvement from saba p nothing prior to study with DLCO  15.22 (66%) corrects to 3.05 (74%)  for alv volume and FV curve concave classically     Degeneration of cervical intervertebral disc    Degeneration of lumbar intervertebral disc 06/14/2015    Depression 03/17/2010   Qualifier: Diagnosis of  By: Nelson-Smith CMA (AAMA), Dottie     Diverticulosis    DIVERTICULOSIS, COLON 03/17/2010   Qualifier: Diagnosis of  By: Harlon Ditty CMA (AAMA), Dottie     Dyspnea    Exogenous obesity 11/08/2013   Overview:  With a nine pound weight gain since his last visit   Heart murmur    History of back surgery    Rods and Screws in back   History of craniotomy 01/11/2018   History of left knee replacement    History of right MCA stroke 07/21/2014   Hyperlipidemia LDL goal <70 01/19/2018   Hypertension    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 Oneida Alar and 06/10/2015 last visit   Impairment of balance 10/27/2021   Inflammation of sacroiliac joint ([place]) 05/06/2019   Lower urinary tract symptoms (LUTS) 08/06/2017   Meningioma ([place])    Microscopic hematuria 11/08/2013   Mitral regurgitation 06/02/2021   Muscle weakness 10/27/2021   Neck pain 03/25/2019   Nocturia    OA (osteoarthritis) of knee 04/01/2012   OAB (overactive bladder) 01/19/2014   OSA (obstructive sleep apnea)    Bipap per Chodri  since ? 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 MAngelena Form& Dr. BCyndia Bent  Severe aortic stenosis 02/01/2018   ECHO 01/30/18 - Left ventricle: The cavity size was normal. There was moderate   focal basal hypertrophy of the septum. Systolic function was   vigorous. The estimated ejection fraction was in the  range of 65%   to 70%. Wall motion was normal; there were no regional wall   motion abnormalities. Doppler parameters are consistent with   abnormal left ventricular relaxation (grade 1 diastolic   dysfun   Status post AAA (abdominal aortic aneurysm) repair 02/01/2018   [place]   tia no residual deficit from   Tobacco use 11/08/2013   Ventral hernia 11/08/2013   Overview:  S/p repair with alloderm mesh and s/p SB resection    Past Surgical History:  Procedure Laterality Date   ABDOMINAL AORTIC ANEURYSM REPAIR  2019   [place] child   BACK SURGERY  2018   Rods and Screws in Back lower back   BRAIN MENINGIOMA EXCISION  1991   menigioma   CARPAL TUNNEL RELEASE     left   EYE SURGERY Bilateral    Cataract   IRRIGATION AND DEBRIDEMENT ABSCESS Left 08/25/2020   Procedure: IRRIGATION AND DEBRIDEMENT ABSCESS LEFT LEG;  Surgeon: PEvelina Bucy DPM;  [facility]  Service: Podiatry;  Laterality: Left;   JOINT REPLACEMENT Left 04-01-12   Knee   LEFT HEART CATH AND CORONARY ANGIOGRAPHY N/A 09/15/2016   Procedure: Left Heart Cath and Coronary Angiography;  Surgeon: CNelva Bush MD;  [facility];  Service: Cardiovascular;  Laterality: N/A;   MAXIMUM ACCESS (MAS)POSTERIOR LUMBAR INTERBODY FUSION (PLIF) 1 LEVEL N/A 07/07/2015   Procedure:  POSTERIOR LUMBAR INTERBODY FUSION (PLIF) Lumbar Four-Five with Pedicle Screw Fixation Lumbar Two-Five;Laminectomy Lumbar Two-Five;  Surgeon: DEustace Moore MD;  [facility];  Service: Neurosurgery;  Laterality: N/A;   POSTERIOR LUMBAR INTERBODY FUSION (PLIF) Lumbar Four-Five with Pedicle Screw Fixation Lumbar Two-Five;Laminectomy Lumbar Two-Five   RIGHT/LEFT HEART CATH AND CORONARY ANGIOGRAPHY N/A 01/24/2018   Procedure: RIGHT/LEFT HEART CATH AND CORONARY ANGIOGRAPHY;  Surgeon: ENelva Bush MD;  [facility];  Service: Cardiovascular;  Laterality: N/A;    RIGHT/LEFT HEART CATH AND CORONARY ANGIOGRAPHY N/A 05/31/2021   Procedure: RIGHT/LEFT HEART CATH AND CORONARY ANGIOGRAPHY;  Surgeon: MBurnell Blanks MD;  [facility];  Service: Cardiovascular;  Laterality: N/A;   TEE WITHOUT CARDIOVERSION N/A 06/07/2021   Procedure: TRANSESOPHAGEAL ECHOCARDIOGRAM (TEE);  Surgeon: MBurnell Blanks MD;  [facility]  Service: Open Heart Surgery;  Laterality: N/A;   TEE WITHOUT CARDIOVERSION N/A 12/08/2021   Procedure: TRANSESOPHAGEAL ECHOCARDIOGRAM (TEE);  Surgeon: TBerniece Salines DO;  [facility]  Service: Cardiovascular;  Laterality: N/A;   TONSILLECTOMY  as child   TOTAL KNEE ARTHROPLASTY  04/01/2012  Procedure: TOTAL KNEE ARTHROPLASTY;  Surgeon: Gearlean Alf, MD;  [facility];  Service: Orthopedics;  Laterality: Left;   TRANSCATHETER AORTIC VALVE REPLACEMENT, TRANSFEMORAL Bilateral 06/07/2021   Procedure: TRANSCATHETER AORTIC VALVE REPLACEMENT, RIGHT TRANSFEMORAL;  Surgeon: Burnell Blanks, MD;  Location: [facility];  Service: Open Heart Surgery;  Laterality: Bilateral;   TRANSURETHRAL RESECTION OF PROSTATE     ULTRASOUND GUIDANCE FOR VASCULAR ACCESS Bilateral 06/07/2021   Procedure: ULTRASOUND GUIDANCE FOR VASCULAR ACCESS;  Surgeon: Burnell Blanks, MD;  Location: [facility];  Service: Open Heart Surgery;  Laterality: Bilateral;   UMBILICAL HERNIA REPAIR  2011    Family History  Problem Relation Age of Onset   Heart disease Mother        Onset ~58 y/o   Hypertension Mother        Deceased from old age at 63   Hyperlipidemia Mother    Arthritis Mother    Diabetes Father        Deceased from old age at 86   Heart attack Father    Heart disease Father        CABG at age 12   Dementia Father 37   Arthritis Father    Prostate cancer Maternal Grandfather    Colon cancer Neg Hx    Esophageal cancer Neg Hx    Rectal cancer Neg Hx    Stomach cancer Neg Hx     Social History:  reports that he quit  smoking about 7 months ago. His smoking use included cigarettes. He has a 30.00 pack-year smoking history. He has never used smokeless tobacco. He reports current alcohol use. He reports that he does not use drugs.  Allergies:  Allergies  Allergen Reactions   Cefazolin Rash and Other (See Comments)    The patient had surgery and was given cefazolin intraop. ~ 10 days later he developed a rash confirmed by biopsy to be consistent w/ drug eruption. We cannot know for sure, but this is the most likely agent.      Medications: I have reviewed the patient's current medications.  Results for orders placed or performed during the hospital encounter of 11/30/21 (from the past 48 hour(s))  CBC with Differential/Platelet     Status: Abnormal   Collection Time: 12/08/21  2:38 AM  Result Value Ref Range   WBC 12.7 (H) 4.0 - 10.5 K/uL   RBC 4.07 (L) 4.22 - 5.81 MIL/uL   Hemoglobin 12.8 (L) 13.0 - 17.0 g/dL   HCT 38.6 (L) 39.0 - 52.0 %   MCV 94.8 80.0 - 100.0 fL   MCH 31.4 26.0 - 34.0 pg   MCHC 33.2 30.0 - 36.0 g/dL   RDW 14.0 11.5 - 15.5 %   Platelets 211 150 - 400 K/uL   nRBC 0.0 0.0 - 0.2 %   Neutrophils Relative % 78 %   Neutro Abs 10.0 (H) 1.7 - 7.7 K/uL   Lymphocytes Relative 10 %   Lymphs Abs 1.3 0.7 - 4.0 K/uL   Monocytes Relative 7 %   Monocytes Absolute 0.9 0.1 - 1.0 K/uL   Eosinophils Relative 1 %   Eosinophils Absolute 0.1 0.0 - 0.5 K/uL   Basophils Relative 1 %   Basophils Absolute 0.1 0.0 - 0.1 K/uL   Immature Granulocytes 3 %   Abs Immature Granulocytes 0.34 (H) 0.00 - 0.07 K/uL    Comment: Performed at [facility], [address] [place], [place] [address]  Basic metabolic panel  Status: Abnormal   Collection Time: 12/08/21  2:38 AM  Result Value Ref Range   Sodium 136 135 - 145 mmol/L   Potassium 4.7 3.5 - 5.1 mmol/L   Chloride 92 (L) 98 - 111 mmol/L   CO2 32 22 - 32 mmol/L   Glucose, Bld 115 (H) 70 - 99 mg/dL    Comment: Glucose reference range applies  only to samples taken after fasting for at least 8 hours.   BUN 20 8 - 23 mg/dL   Creatinine, Ser 0.84 0.61 - 1.24 mg/dL   Calcium 8.8 (L) 8.9 - 10.3 mg/dL   GFR, Estimated >60 >60 mL/min    Comment: (NOTE) Calculated using the CKD-EPI Creatinine Equation (2021)    Anion gap 12 5 - 15    Comment: Performed at [facility] [address], [place], [place] [address]  Glucose, capillary     Status: Abnormal   Collection Time: 12/08/21  7:02 AM  Result Value Ref Range   Glucose-Capillary 123 (H) 70 - 99 mg/dL    Comment: Glucose reference range applies only to samples taken after fasting for at least 8 hours.  Gentamicin level, trough     Status: None   Collection Time: 12/08/21  6:01 PM  Result Value Ref Range   Gentamicin Trough 1.0 0.5 - 2.0 ug/mL    Comment: Performed at [facility] [address] [place], [place] [address]  CBC with Differential/Platelet     Status: Abnormal   Collection Time: 12/09/21  3:04 AM  Result Value Ref Range   WBC 10.3 4.0 - 10.5 K/uL   RBC 3.75 (L) 4.22 - 5.81 MIL/uL   Hemoglobin 12.0 (L) 13.0 - 17.0 g/dL   HCT 35.5 (L) 39.0 - 52.0 %   MCV 94.7 80.0 - 100.0 fL   MCH 32.0 26.0 - 34.0 pg   MCHC 33.8 30.0 - 36.0 g/dL   RDW 14.1 11.5 - 15.5 %   Platelets 210 150 - 400 K/uL   nRBC 0.0 0.0 - 0.2 %   Neutrophils Relative % 80 %   Neutro Abs 8.3 (H) 1.7 - 7.7 K/uL   Lymphocytes Relative 9 %   Lymphs Abs 0.9 0.7 - 4.0 K/uL   Monocytes Relative 8 %   Monocytes Absolute 0.8 0.1 - 1.0 K/uL   Eosinophils Relative 1 %   Eosinophils Absolute 0.1 0.0 - 0.5 K/uL   Basophils Relative 0 %   Basophils Absolute 0.0 0.0 - 0.1 K/uL   Immature Granulocytes 2 %   Abs Immature Granulocytes 0.19 (H) 0.00 - 0.07 K/uL    Comment: Performed at [facility] [address], [place], [place] [address]  Basic metabolic panel     Status: Abnormal   Collection Time: 12/09/21  3:04 AM  Result Value Ref Range   Sodium 135 135 - 145 mmol/L   Potassium  4.4 3.5 - 5.1 mmol/L   Chloride 95 (L) 98 - 111 mmol/L   CO2 33 (H) 22 - 32 mmol/L   Glucose, Bld 115 (H) 70 - 99 mg/dL    Comment: Glucose reference range applies only to samples taken after fasting for at least 8 hours.   BUN 15 8 - 23 mg/dL   Creatinine, Ser 0.72 0.61 - 1.24 mg/dL   Calcium 8.3 (L) 8.9 - 10.3 mg/dL   GFR, Estimated >60 >60 mL/min    Comment: (NOTE) Calculated using the CKD-EPI Creatinine Equation (2021)  Anion gap 7 5 - 15    Comment: Performed at [facility] [address], [place], [place] [address]  Magnesium     Status: None   Collection Time: 12/09/21  3:04 AM  Result Value Ref Range   Magnesium 2.2 1.7 - 2.4 mg/dL    Comment: Performed at [facility] [address], [place], [place] [address]  Glucose, capillary     Status: Abnormal   Collection Time: 12/09/21  7:52 AM  Result Value Ref Range   Glucose-Capillary 120 (H) 70 - 99 mg/dL    Comment: Glucose reference range applies only to samples taken after fasting for at least 8 hours.    MR Shoulder Left W Wo Contrast  Result Date: 12/09/2021 CLINICAL DATA:  Septic arthritis suspected, shoulder. Muscle weakness. EXAM: MRI OF THE LEFT SHOULDER WITHOUT AND WITH CONTRAST TECHNIQUE: Multiplanar, multisequence MR imaging of the left shoulder was performed before and after the administration of intravenous contrast. CONTRAST:  80m GADAVIST GADOBUTROL 1 MMOL/ML IV SOLN COMPARISON:  Limited correlation made with chest CTA 11/30/2021. FINDINGS: Despite efforts by the technologist and patient, mild motion artifact is present on today's exam and could not be eliminated. This reduces exam sensitivity and specificity. Rotator cuff: Diffuse rotator cuff tendinosis, especially within the supraspinatus and infraspinatus tendons. Partial intrasubstance tearing of the distal infraspinatus tendon. No full-thickness tendon tear or tendon retraction. Muscles: No significant focal rotator cuff muscular atrophy  identified. There is mild edema and enhancement throughout the rotator cuff musculature without focal intramuscular fluid collection. Biceps long head: Tendinosis of the intra-articular portion. The tendon is normally located in the bicipital groove. Acromioclavicular Joint: The acromion is type 2. There are moderate acromioclavicular degenerative changes without evidence of septic arthritis at the acromioclavicular joint. There is a small amount of fluid in the subacromial-subdeltoid bursa. There is diffuse enhancement throughout the bursa following contrast. Glenohumeral Joint: Moderate-sized complex shoulder joint effusion with most of the fluid anteriorly within the superior subscapularis recess. There is diffuse synovial enhancement of this joint fluid. Advanced glenohumeral degenerative changes with diffuse chondral thinning and osteophytes of the humeral head. There is remodeling of the glenoid with subchondral cyst formation inferiorly. No suspicious erosions or osseous enhancement to suggest osteomyelitis. Labrum: Diffuse labral degeneration, especially posteriorly. Bones: As above, advanced glenohumeral degenerative changes. No evidence of acute fracture. No specific signs of osteomyelitis. Other: Peripheral to the joint, no definite fluid collections are identified. There is generalized soft tissue edema surrounding the shoulder. IMPRESSION: 1. Complex shoulder joint effusion with diffuse synovial enhancement, especially within the superior subscapularis recess. Findings are worrisome for septic arthritis or other inflammatory arthropathy. 2. No specific evidence of osteomyelitis. 3. Advanced glenohumeral degenerative changes with diffuse labral degeneration. 4. Diffuse rotator cuff tendinosis with partial infraspinatus tendon tearing. No full-thickness rotator cuff tear or focal muscular atrophy. Electronically Signed   By: WRichardean SaleM.D.   On: 12/09/2021 09:59   ECHO TEE  Result Date:  12/08/2021    TRANSESOPHOGEAL ECHO REPORT   Patient Name:   Kyle RADate of Exam: 12/08/2021 Medical Rec #:  [id number]      Height:       68.0 in Accession #:    [id number]     Weight:       218.9 lb Date of Birth:  [birth year]     BSA:          2.124 m Patient Age:  75 years        BP:           91/42 mmHg Patient Gender: M               HR:           66 bpm. Exam Location:  Inpatient Procedure: Transesophageal Echo, Color Doppler, 3D Echo and Cardiac Doppler Indications:     bacteremia  History:         Patient has prior history of Echocardiogram examinations, most                  recent 12/06/2021. COPD; Risk Factors:Hypertension, Dyslipidemia,                  Former Smoker and Sleep Apnea.                  Aortic Valve: 29 mm Edwards TAVR valve is present in the aortic                  position.  Sonographer:     Johny Chess RDCS Referring Phys:  [id number] Margie Billet Diagnosing Phys: Berniece Salines DO PROCEDURE: After discussion of the risks and benefits of a TEE, an informed consent was obtained from the patient. The transesophogeal probe was passed without difficulty through the esophogus of the patient. Imaged were obtained with the patient in a left lateral decubitus position. Local oropharyngeal anesthetic was provided with viscous lidocaine. Sedation performed by different physician. Image quality was adequate. The patient developed no complications during the procedure. IMPRESSIONS  1. There is a highly mobile mass on the base of the left coronary cusp highly suspicious for a vegetation. No appreciation of an abcess. Consider cardiac CTA if need for further evaluation. The aortic valve has been repaired/replaced. Aortic valve regurgitation is trivial. No aortic stenosis is present. There is a 29 mm Edwards TAVR valve present in the aortic position.  2. Left ventricular ejection fraction, by estimation, is 60 to 65%. The left ventricle has normal function.  3. Right ventricular systolic function  is normal. The right ventricular size is normal.  4. No left atrial/left atrial appendage thrombus was detected. The LAA emptying velocity was 59 cm/s.  5. The mitral valve is normal in structure. Mild mitral valve regurgitation.  6. Tricuspid leaflets appears to be mildly thickened.  7. There is mild (Grade II) layered plaque.  8. Mildly dilated pulmonary artery. Conclusion(s)/Recommendation(s): Findings concerning for aortic valve vegetation. Consider cardiac cta for further clarification. FINDINGS  Left Ventricle: Left ventricular ejection fraction, by estimation, is 60 to 65%. The left ventricle has normal function. The left ventricular internal cavity size was normal in size. Right Ventricle: The right ventricular size is normal. No increase in right ventricular wall thickness. Right ventricular systolic function is normal. Left Atrium: Left atrial size was not well visualized. No left atrial/left atrial appendage thrombus was detected. The LAA emptying velocity was 59 cm/s. Right Atrium: Right atrial size was not well visualized. Mitral Valve: The mitral valve is normal in structure. Mild mitral valve regurgitation. Tricuspid Valve: Tricuspid leaflets appears to be mildly thickened. Tricuspid valve regurgitation is mild . No evidence of tricuspid stenosis. Aortic Valve: There is a highly mobile mass on the base of the left coronary cusp highly suspicious for a vegetation. No appreciation of an abcess. Consider cardiac CTA if need for further evaluation. The aortic valve has been repaired/replaced. Aortic valve regurgitation is trivial. No aortic stenosis  is present. Aortic valve mean gradient measures 12.0 mmHg. Aortic valve peak gradient measures 23.4 mmHg. Aortic valve area, by VTI measures 1.75 cm. There is a 29 mm Edwards TAVR valve present in the aortic position. Pulmonic Valve: The pulmonic valve was normal in structure. Pulmonic valve regurgitation is not visualized. Aorta: The aortic root and  ascending aorta are structurally normal, with no evidence of dilitation. There is mild (Grade II) layered plaque. Pulmonary Artery: The pulmonary artery is mildly dilated. Venous: The left upper pulmonary vein, left lower pulmonary vein, right upper pulmonary vein and right lower pulmonary vein are normal. A normal flow pattern is recorded from the left upper pulmonary vein. IAS/Shunts: No atrial level shunt detected by color flow Doppler.  LEFT VENTRICLE PLAX 2D LVOT diam:     2.10 cm LV SV:         76 LV SV Index:   36 LVOT Area:     3.46 cm  AORTIC VALVE AV Area (Vmax):    1.50 cm AV Area (Vmean):   1.24 cm AV Area (VTI):     1.75 cm AV Vmax:           242.00 cm/s AV Vmean:          157.000 cm/s AV VTI:            0.431 m AV Peak Grad:      23.4 mmHg AV Mean Grad:      12.0 mmHg LVOT Vmax:         105.00 cm/s LVOT Vmean:        56.400 cm/s LVOT VTI:          0.218 m LVOT/AV VTI ratio: 0.51  SHUNTS Systemic VTI:  0.22 m Systemic Diam: 2.10 cm Kardie Tobb DO Electronically signed by Berniece Salines DO Signature Date/Time: 12/08/2021/3:22:09 PM    Final    MR LUMBAR SPINE WO CONTRAST  Result Date: 12/07/2021 CLINICAL DATA:  Low back pain, infection suspected. 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 EXAM: MRI LUMBAR SPINE WITHOUT CONTRAST TECHNIQUE: Multiplanar, multisequence MR imaging of the lumbar spine was performed. No intravenous contrast was administered. COMPARISON:  CTA chest/abdomen/pelvis 11/30/2021, lumbar spine MRI 06/03/2020 FINDINGS: Segmentation: Standard; the lowest formed disc space is designated L5-S1. Alignment: There is exaggerated lumbar lordosis with stepwise grade 1 retrolisthesis of L1 on L2 through L3 on L4 and grade 1 anterolisthesis of L4 on L5 unchanged since the prior MRI from 2021. Vertebrae: Postsurgical changes reflecting posterior instrumented fusion and decompression from L2 through L5 are noted. Vertebral body heights are preserved. Background marrow signal is normal. There is degenerative  marrow signal abnormality from L1 through L5. There is T1 hypointensity with faint edema in the marrow along the L5-S1 disc space with fluid signal in the disc and mild prevertebral/presacral edema (6-12 6-7). There is advanced facet arthropathy with fluid in both facet joints at this level but no convincing evidence of septic arthritis within the confines of noncontrast technique. There is thin epidural fluid along the ventral aspect of the spinal canal at T12 through L3 (for example 8-1, 8-8, 8-14). At L5 and S1 there is a more prominent ventral epidural fluid collection measuring up to 1.0 cm in thickness suspicious for epidural abscess. Conus medullaris and cauda equina: Conus extends to the L1 level. Conus and cauda equina appear normal. Paraspinal and other soft tissues: As above, there is prevertebral edema centered at L5-S1 extending along the ventral aspect of the sacrum. The paraspinal soft tissues are  otherwise unremarkable. There is significant artifact due to the aortic stent graft. Disc levels: T12-L1: No significant spinal canal or neural foraminal stenosis L1-L2: There is marked disc desiccation and narrowing with a mild disc bulge but no significant spinal canal or neural foraminal stenosis L2-L3: Status post posterior instrumented fusion. There is mild degenerative endplate spurring without significant spinal canal or neural foraminal stenosis L3-L4: Status post posterior fusion and decompression. Degenerative endplate spurring and facet arthropathy results in moderate left worse than right neural foraminal stenosis without significant spinal canal stenosis. L4-L5: Status post posterior fusion and decompression. There is degenerative endplate change and facet arthropathy resulting in at least moderate bilateral neural foraminal stenosis left worse than right, without significant spinal canal stenosis L5-S1: There is advanced bilateral facet arthropathy with effusions and degenerative endplate  change with a mild bulge resulting in moderate to severe bilateral neural foraminal stenosis. There is severe spinal canal stenosis with compression of the cauda equina nerve roots due to the above described epidural fluid collection. IMPRESSION: 1. Epidural fluid collection along the ventral spinal canal extending from T12 through S1 most prominent at L5-S1 where it measures up to 1.0 cm in thickness raising suspicion for epidural abscess. 2. T1 hypointense marrow signal along the L5-S1 disc space with faint marrow edema raises suspicion for early discitis/osteomyelitis, but there is no progressed osseous destruction or irregularity at this time. There is associated mild prevertebral/presacral soft tissue edema. 3. Advanced facet arthropathy with bilateral effusions at L5-S1 without convincing evidence of septic arthritis. 4. Consider postcontrast imaging of the lumbar spine when the patient can tolerate for better evaluation of the above findings. 5. Postsurgical changes reflecting posterior fusion and decompression from L2 through L5 with moderate left worse than right neural foraminal stenosis at L3-L4, at least moderate bilateral neural foraminal stenosis at L4-L5, and moderate to severe bilateral neural foraminal stenosis at L5-S1. Electronically Signed   By: Valetta Mole M.D.   On: 12/07/2021 15:10    Review of Systems  HENT:  Negative for ear discharge, ear pain, hearing loss and tinnitus.   Eyes:  Negative for photophobia and pain.  Respiratory:  Negative for cough and shortness of breath.   Cardiovascular:  Negative for chest pain.  Gastrointestinal:  Negative for abdominal pain, nausea and vomiting.  Genitourinary:  Negative for dysuria, flank pain, frequency and urgency.  Musculoskeletal:  Positive for arthralgias (Left shoulder) and back pain. Negative for myalgias and neck pain.  Neurological:  Negative for dizziness and headaches.  Hematological:  Does not bruise/bleed easily.   Psychiatric/Behavioral:  The patient is not nervous/anxious.    Blood pressure (!) 122/58, pulse 64, temperature 98.1 F (36.7 C), temperature source Oral, resp. rate 19, height '5\' 8"'$  (1.727 m), weight 99.2 kg, SpO2 91 %. Physical Exam Constitutional:      General: He is not in acute distress.    Appearance: He is well-developed. He is not diaphoretic.  HENT:     Head: Normocephalic and atraumatic.  Eyes:     General: No scleral icterus.       Right eye: No discharge.        Left eye: No discharge.     Conjunctiva/sclera: Conjunctivae normal.  Cardiovascular:     Rate and Rhythm: Normal rate and regular rhythm.  Pulmonary:     Effort: Pulmonary effort is normal. No respiratory distress.  Musculoskeletal:     Cervical back: Normal range of motion.     Comments: Left shoulder, elbow, wrist,  digits- no skin wounds, Mild joint line TTP shoulder, mod pain with PROM though he let's me abduct and flex about 80 degrees, virtually no AROM 2/2 pain, no instability, no blocks to motion  Sens  Ax/R/M/U intact  Mot   Ax/ R/ PIN/ M/ AIN/ U intact  Lewis 2+  Skin:    General: Skin is warm and dry.  Neurological:     Mental Status: He is alert.  Psychiatric:        Mood and Affect: Mood normal.        Behavior: Behavior normal.     Assessment/Plan: Left shoulder pain -- Will get IR to aspirate.    Lisette Abu, PA-C Orthopedic Surgery [phone] 12/09/2021, 1:23 PM

## 2021-12-09 NOTE — Progress Notes (Signed)
Subjective:  Kyle Lewis is a 76 y.o. male   Patient reports pain as moderate.  Reports left shoulder pain is moderate and bothering the past 3 weeks or so.  He has known severe left shoulder osteoarthritis.  Reports pain with active motion. He was previously seen by Dr. Onnie Graham consideration of replacement however given his health was not a good surgical candidate. Objective:   VITALS:   Vitals:   12/09/21 0715 12/09/21 0800 12/09/21 1100 12/09/21 1549  BP: (!) 122/58   (!) 154/86  Pulse:   64 70  Resp: 19   20  Temp:    97.7 F (36.5 C)  TempSrc:    Oral  SpO2:  96% 91% 99%  Weight:      Height:       General; in hospital bed no acute distress   Left upper Extremity:   INSPECTION & PALPATION: No gross deformity of the shoulder, no tenderness to palpation.  No erythema or wounds.  Drug eruption rash present around the chest close to the shoulder.    SENSORY: sensation is intact to light touch in:  superficial radial nerve distribution (dorsal first web space) median nerve distribution (tip of index finger)   ulnar nerve distribution (tip of small finger)    Axillary nerve distribution (lateral shoulder)   ROM: Full elbow range of motion with some pain at the shoulder.  Active forward flexion to 55, passive forward flexion to 85 with significant crepitus and grinding.   MOTOR:  + motor posterior interosseous nerve (thumb IP extension) + anterior interosseous nerve (thumb IP flexion, index finger DIP flexion) + radial nerve (wrist extension) + median nerve (palpable firing thenar mass) + ulnar nerve (palpable firing of first dorsal interosseous muscle) + Axillary nerve (palpable firing of deltoid)   VASCULAR: 2+ radial pulse, brisk capillary refill < 2 sec, fingers warm and well-perfused     Lab Results  Component Value Date   WBC 10.3 12/09/2021   HGB 12.0 (L) 12/09/2021   HCT 35.5 (L) 12/09/2021   MCV 94.7 12/09/2021   PLT 210 12/09/2021   BMET     Component Value Date/Time   NA 135 12/09/2021 0304   NA 145 (H) 07/13/2021 1628   K 4.4 12/09/2021 0304   CL 95 (L) 12/09/2021 0304   CO2 33 (H) 12/09/2021 0304   GLUCOSE 115 (H) 12/09/2021 0304   BUN 15 12/09/2021 0304   BUN 23 07/13/2021 1628   CREATININE 0.72 12/09/2021 0304   CREATININE 0.79 06/14/2021 1523   CALCIUM 8.3 (L) 12/09/2021 0304   EGFR 93 07/13/2021 1628   GFRNONAA >60 12/09/2021 0304   GFRNONAA 93 04/08/2020 1506     Assessment/Plan:   Principal Problem:   Prosthetic valve endocarditis (HCC) Active Problems:   OSA (obstructive sleep apnea)   Tobacco use   Hyperlipidemia LDL goal <70   S/P TAVR (transcatheter aortic valve replacement)   CAP (community acquired pneumonia)   Accelerated hypertension   Acute respiratory failure with hypoxia ([place])   COPD with acute exacerbation (HCC)   Advance diet Up with therapy  I reviewed the MRI with Dr. Onnie Graham revealing shoulder effusion that is more so consistent with his severe osteoarthritis..  I also reviewed the preliminary lab results from his shoulder aspiration.  This shows no signs of any active infection.   His exam is consistent with his severe arthritis and  motion is similar to previous exams that were done in our office. We  will see what his synovial cell count results however at this time this appears to be related to his chronic left shoulder pain secondary to severe osteoarthritis.  Patient to follow-up with [place] as needed.  Page or message if any significant changes occur.   Faythe Casa 12/09/2021, 5:29 PM  Laramie Meissner PA-C   [facility] [place]

## 2021-12-09 NOTE — TOC Progression Note (Signed)
Transition of Care [facility]) - Progression Note    Patient Details  Name: Kyle Lewis MRN: [id number] Date of Birth: Nov 05, 1945  Transition of Care [facility]) CM/SW [place], [place] Phone Number: 12/09/2021, 10:04 AM  Clinical Narrative:     Patient has SN[facility] bed [facility] when medically ready for dc. CSW will continue to follow and assist with patients dc planning needs.  Expected Discharge Plan: [place] Barriers to Discharge: Continued Medical Work up  Expected Discharge Plan and Services Expected Discharge Plan: [place] In-house Referral: Clinical Social Work     Living arrangements for the past 2 months: Single Family Home                                       Social Determinants of Health (SDOH) Interventions    Readmission Risk Interventions    06/10/2021   10:04 AM  Readmission Risk Prevention Plan  Transportation Screening Complete  PCP or Specialist Appt within 5-7 Days Complete  Home Care Screening Complete  Medication Review (RN CM) Complete

## 2021-12-09 NOTE — Progress Notes (Signed)
Mobility Specialist Progress Note    12/09/21 1446  Mobility  Activity Off unit   Pt at procedure. Will f/u as schedule permits.   Hildred Alamin Mobility Specialist

## 2021-12-09 NOTE — Care Management Important Message (Signed)
Important Message  Patient Details  Name: Kyle Lewis MRN: [id number] Date of Birth: 11/23/1945   Medicare Important Message Given:  Yes     [name] 12/09/2021, 10:31 AM

## 2021-12-09 NOTE — Progress Notes (Addendum)
Subjective: Patient reports moderate back pain that radiates to the left and right as well as down the back of his legs.   Objective: Vital signs in last 24 hours: Temp:  [97.8 F (36.6 C)-98.6 F (37 C)] 98.1 F (36.7 C) (07/07 0511) Pulse Rate:  [62-77] 62 (07/07 0511) Resp:  [16-20] 19 (07/07 0715) BP: (91-152)/(58-75) 122/58 (07/07 0715) SpO2:  [93 %-97 %] 96 % (07/07 0800) Weight:  [99.2 kg-99.3 kg] 99.2 kg (07/07 0500)  Intake/Output from previous day: 07/06 0701 - 07/07 0700 In: 1384.9 [I.V.:494.9; IV Piggyback:890] Out: 850 [Urine:850] Intake/Output this shift: No intake/output data recorded.  Neurologic: Grossly normal  Lab Results: Lab Results  Component Value Date   WBC 10.3 12/09/2021   HGB 12.0 (L) 12/09/2021   HCT 35.5 (L) 12/09/2021   MCV 94.7 12/09/2021   PLT 210 12/09/2021   Lab Results  Component Value Date   INR 1.0 12/07/2021   BMET Lab Results  Component Value Date   NA 135 12/09/2021   K 4.4 12/09/2021   CL 95 (L) 12/09/2021   CO2 33 (H) 12/09/2021   GLUCOSE 115 (H) 12/09/2021   BUN 15 12/09/2021   CREATININE 0.72 12/09/2021   CALCIUM 8.3 (L) 12/09/2021    Studies/Results: ECHO TEE  Result Date: 12/08/2021    TRANSESOPHOGEAL ECHO REPORT   Patient Name:   Kyle Lewis Date of Exam: 12/08/2021 Medical Rec #:  [id number]       Height:       68.0 in Accession #:    [id number]      Weight:       218.9 lb Date of Birth:  07/28/1945      BSA:          2.124 m Patient Age:    76 years        BP:           91/42 mmHg Patient Gender: M               HR:           66 bpm. Exam Location:  Inpatient Procedure: Transesophageal Echo, Color Doppler, 3D Echo and Cardiac Doppler Indications:     bacteremia  History:         Patient has prior history of Echocardiogram examinations, most                  recent 12/06/2021. COPD; Risk Factors:Hypertension, Dyslipidemia,                  Former Smoker and Sleep Apnea.                  Aortic Valve: 29 mm Edwards  TAVR valve is present in the aortic                  position.  Sonographer:     Johny Chess RDCS Referring Phys:  [id number] Margie Billet Diagnosing Phys: Berniece Salines DO PROCEDURE: After discussion of the risks and benefits of a TEE, an informed consent was obtained from the patient. The transesophogeal probe was passed without difficulty through the esophogus of the patient. Imaged were obtained with the patient in a left lateral decubitus position. Local oropharyngeal anesthetic was provided with viscous lidocaine. Sedation performed by different physician. Image quality was adequate. The patient developed no complications during the procedure. IMPRESSIONS  1. There is a highly mobile mass on the base of the left coronary cusp highly suspicious  for a vegetation. No appreciation of an abcess. Consider cardiac CTA if need for further evaluation. The aortic valve has been repaired/replaced. Aortic valve regurgitation is trivial. No aortic stenosis is present. There is a 29 mm Edwards TAVR valve present in the aortic position.  2. Left ventricular ejection fraction, by estimation, is 60 to 65%. The left ventricle has normal function.  3. Right ventricular systolic function is normal. The right ventricular size is normal.  4. No left atrial/left atrial appendage thrombus was detected. The LAA emptying velocity was 59 cm/s.  5. The mitral valve is normal in structure. Mild mitral valve regurgitation.  6. Tricuspid leaflets appears to be mildly thickened.  7. There is mild (Grade II) layered plaque.  8. Mildly dilated pulmonary artery. Conclusion(s)/Recommendation(s): Findings concerning for aortic valve vegetation. Consider cardiac cta for further clarification. FINDINGS  Left Ventricle: Left ventricular ejection fraction, by estimation, is 60 to 65%. The left ventricle has normal function. The left ventricular internal cavity size was normal in size. Right Ventricle: The right ventricular size is normal. No increase  in right ventricular wall thickness. Right ventricular systolic function is normal. Left Atrium: Left atrial size was not well visualized. No left atrial/left atrial appendage thrombus was detected. The LAA emptying velocity was 59 cm/s. Right Atrium: Right atrial size was not well visualized. Mitral Valve: The mitral valve is normal in structure. Mild mitral valve regurgitation. Tricuspid Valve: Tricuspid leaflets appears to be mildly thickened. Tricuspid valve regurgitation is mild . No evidence of tricuspid stenosis. Aortic Valve: There is a highly mobile mass on the base of the left coronary cusp highly suspicious for a vegetation. No appreciation of an abcess. Consider cardiac CTA if need for further evaluation. The aortic valve has been repaired/replaced. Aortic valve regurgitation is trivial. No aortic stenosis is present. Aortic valve mean gradient measures 12.0 mmHg. Aortic valve peak gradient measures 23.4 mmHg. Aortic valve area, by VTI measures 1.75 cm. There is a 29 mm Edwards TAVR valve present in the aortic position. Pulmonic Valve: The pulmonic valve was normal in structure. Pulmonic valve regurgitation is not visualized. Aorta: The aortic root and ascending aorta are structurally normal, with no evidence of dilitation. There is mild (Grade II) layered plaque. Pulmonary Artery: The pulmonary artery is mildly dilated. Venous: The left upper pulmonary vein, left lower pulmonary vein, right upper pulmonary vein and right lower pulmonary vein are normal. A normal flow pattern is recorded from the left upper pulmonary vein. IAS/Shunts: No atrial level shunt detected by color flow Doppler.  LEFT VENTRICLE PLAX 2D LVOT diam:     2.10 cm LV SV:         76 LV SV Index:   36 LVOT Area:     3.46 cm  AORTIC VALVE AV Area (Vmax):    1.50 cm AV Area (Vmean):   1.24 cm AV Area (VTI):     1.75 cm AV Vmax:           242.00 cm/s AV Vmean:          157.000 cm/s AV VTI:            0.431 m AV Peak Grad:      23.4  mmHg AV Mean Grad:      12.0 mmHg LVOT Vmax:         105.00 cm/s LVOT Vmean:        56.400 cm/s LVOT VTI:          0.218 m LVOT/AV  VTI ratio: 0.51  SHUNTS Systemic VTI:  0.22 m Systemic Diam: 2.10 cm Kardie Tobb DO Electronically signed by Berniece Salines DO Signature Date/Time: 12/08/2021/3:22:09 PM    Final    MR LUMBAR SPINE WO CONTRAST  Result Date: 12/07/2021 CLINICAL DATA:  Low back pain, infection suspected. 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 EXAM: MRI LUMBAR SPINE WITHOUT CONTRAST TECHNIQUE: Multiplanar, multisequence MR imaging of the lumbar spine was performed. No intravenous contrast was administered. COMPARISON:  CTA chest/abdomen/pelvis 11/30/2021, lumbar spine MRI 06/03/2020 FINDINGS: Segmentation: Standard; the lowest formed disc space is designated L5-S1. Alignment: There is exaggerated lumbar lordosis with stepwise grade 1 retrolisthesis of L1 on L2 through L3 on L4 and grade 1 anterolisthesis of L4 on L5 unchanged since the prior MRI from 2021. Vertebrae: Postsurgical changes reflecting posterior instrumented fusion and decompression from L2 through L5 are noted. Vertebral body heights are preserved. Background marrow signal is normal. There is degenerative marrow signal abnormality from L1 through L5. There is T1 hypointensity with faint edema in the marrow along the L5-S1 disc space with fluid signal in the disc and mild prevertebral/presacral edema (6-12 6-7). There is advanced facet arthropathy with fluid in both facet joints at this level but no convincing evidence of septic arthritis within the confines of noncontrast technique. There is thin epidural fluid along the ventral aspect of the spinal canal at T12 through L3 (for example 8-1, 8-8, 8-14). At L5 and S1 there is a more prominent ventral epidural fluid collection measuring up to 1.0 cm in thickness suspicious for epidural abscess. Conus medullaris and cauda equina: Conus extends to the L1 level. Conus and cauda equina appear normal. Paraspinal  and other soft tissues: As above, there is prevertebral edema centered at L5-S1 extending along the ventral aspect of the sacrum. The paraspinal soft tissues are otherwise unremarkable. There is significant artifact due to the aortic stent graft. Disc levels: T12-L1: No significant spinal canal or neural foraminal stenosis L1-L2: There is marked disc desiccation and narrowing with a mild disc bulge but no significant spinal canal or neural foraminal stenosis L2-L3: Status post posterior instrumented fusion. There is mild degenerative endplate spurring without significant spinal canal or neural foraminal stenosis L3-L4: Status post posterior fusion and decompression. Degenerative endplate spurring and facet arthropathy results in moderate left worse than right neural foraminal stenosis without significant spinal canal stenosis. L4-L5: Status post posterior fusion and decompression. There is degenerative endplate change and facet arthropathy resulting in at least moderate bilateral neural foraminal stenosis left worse than right, without significant spinal canal stenosis L5-S1: There is advanced bilateral facet arthropathy with effusions and degenerative endplate change with a mild bulge resulting in moderate to severe bilateral neural foraminal stenosis. There is severe spinal canal stenosis with compression of the cauda equina nerve roots due to the above described epidural fluid collection. IMPRESSION: 1. Epidural fluid collection along the ventral spinal canal extending from T12 through S1 most prominent at L5-S1 where it measures up to 1.0 cm in thickness raising suspicion for epidural abscess. 2. T1 hypointense marrow signal along the L5-S1 disc space with faint marrow edema raises suspicion for early discitis/osteomyelitis, but there is no progressed osseous destruction or irregularity at this time. There is associated mild prevertebral/presacral soft tissue edema. 3. Advanced facet arthropathy with bilateral  effusions at L5-S1 without convincing evidence of septic arthritis. 4. Consider postcontrast imaging of the lumbar spine when the patient can tolerate for better evaluation of the above findings. 5. Postsurgical changes reflecting posterior fusion and decompression  from L2 through L5 with moderate left worse than right neural foraminal stenosis at L3-L4, at least moderate bilateral neural foraminal stenosis at L4-L5, and moderate to severe bilateral neural foraminal stenosis at L5-S1. Electronically Signed   By: Valetta Mole M.D.   On: 12/07/2021 15:10    Assessment/Plan: 51 year with a complex medical history that was admitted on 6/28 for hypoxemia and back pain. 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 He is currently on gentamycin per ID. No surgical intervention at this time. His strength is still good 5/5, he ambulates with assistance and has a chronic left foot drop.    LOS: 9 days    Ocie Cornfield Catera Hankins 12/09/2021, 8:32 AM

## 2021-12-10 DIAGNOSIS — T826XXA Infection and inflammatory reaction due to cardiac valve prosthesis, initial encounter: Secondary | ICD-10-CM | POA: Diagnosis not present

## 2021-12-10 DIAGNOSIS — I38 Endocarditis, valve unspecified: Secondary | ICD-10-CM | POA: Diagnosis not present

## 2021-12-10 LAB — CULTURE, BLOOD (ROUTINE X 2)
Culture: NO GROWTH
Culture: NO GROWTH
Special Requests: ADEQUATE
Special Requests: ADEQUATE
Special Requests: ADEQUATE

## 2021-12-10 LAB — GLUCOSE, CAPILLARY: Glucose-Capillary: 133 mg/dL — ABNORMAL HIGH (ref 70–99)

## 2021-12-10 MED ORDER — POLYETHYLENE GLYCOL 3350 17 G PO PACK
17.0000 g | PACK | Freq: Every day | ORAL | Status: DC
  Administered 2021-12-10: 17 g via ORAL
  Filled 2021-12-10 (×5): qty 1

## 2021-12-10 NOTE — Progress Notes (Signed)
No new issues or problems.  Patient's back pain reasonably well controlled.  Denies significant radiating pain into his lower extremities.  Denies any new numbness or paresthesias.  Follow-up MRI scan continues to show ventral epidural abscess worse at the L5-S1 level.  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  Continue IV antibiotic treatment and observation.  No plans for surgical intervention at present.

## 2021-12-10 NOTE — Progress Notes (Signed)
[facility]                                                                              Kyle Lewis, is a 76 y.o. 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  He was put on NRB and admitted   Assessment & Plan      Acute on chronic respiratory failure with hypoxia ([place]) likely due to Community-acquired pneumonia and COPD exacerbation -received abx, nebs, steroids, wheezing resolved, now back on baseline o2,   Gemella sanguinis bacteremia , Source suspected to be odontogenic from poor dentition, wife also reports he never cleans his bipap at home TAVR endocarditis, cards consulted, plan to repeat CT next week, management per cards, appreciate assistance  Septic arthritis ?( left shoulder ), s/p drain, gram statin negative, appreciate ortho input Epidural abscess , seen by neurosurgery no surgical intervention recommended at this time CT maxillofacial showed no dental abscess -currently on  penicillin and gentamicin  per ID -Pain control, states Percocet works well  AAA repair -CTA noted no acute dissection or acute aortic process.  A tiny endoleak in the anterior endosac would be difficult to exclude, smaller than on previous imaging, potentially representing calcification, recommended follow-up -cardiology officially consulted     Accelerated hypertension -BP stable on current regimen    Hyperlipidemia LDL goal <70 -Continue statin   OSA (obstructive sleep apnea) -Continue CPAP    Tobacco  use -Counseled smoking cessation     Left Renal cyst - incidentally seen on CT, recommended 3-6 month follow-up    Obesity Estimated body mass index is 32.65 kg/m as calculated from the following:   Height as of this encounter: '5\' 8"'$  (1.727 m).   Weight as of this encounter: 97.4 kg.  OSA , reports use bipap at night at home, reports use home o2 prn at home  FTT; Reports baseline imbalance, use walker/cane  Code Status: Full code DVT Prophylaxis:  heparin injection 5,000 Units Start: 11/30/21 1630   Level of Care: Level of care: Progressive Family Communication:  wife at bedside on 7/6 Disposition Plan:     currently not ready to discharge, Plan fo SNF  eventually when medically ready     Procedures:  TEE  Consultants:   Infectious disease Cardiology Neurosurgery Ortho   Antimicrobials:   Anti-infectives (From admission, onward)    Start     Dose/Rate Route Frequency Ordered Stop   12/09/21 1800  gentamicin (GARAMYCIN) 200 mg in dextrose 5 % 50 mL IVPB        2.5 mg/kg  80.6 kg (Adjusted) 110 mL/hr over  30 Minutes Intravenous Every 24 hours 12/09/21 0853     12/05/21 1800  gentamicin (GARAMYCIN) 240 mg in dextrose 5 % 50 mL IVPB  Status:  Discontinued        3 mg/kg  80.6 kg (Adjusted) 112 mL/hr over 30 Minutes Intravenous Every 24 hours 12/05/21 1317 12/09/21 0853   12/05/21 1400  penicillin G potassium 12 Million Units in dextrose 5 % 500 mL continuous infusion        12 Million Units 41.7 mL/hr over 12 Hours Intravenous Every 12 hours 12/05/21 1305     12/02/21 1800  vancomycin (VANCOREADY) IVPB 1250 mg/250 mL  Status:  Discontinued        1,250 mg 166.7 mL/hr over 90 Minutes Intravenous Every 24 hours 12/01/21 1610 12/05/21 1305   12/01/21 1800  azithromycin (ZITHROMAX) tablet 500 mg        500 mg Oral Every evening 12/01/21 0832 12/04/21 1714   12/01/21 1700  vancomycin (VANCOREADY) IVPB 1750 mg/350 mL        1,750 mg 175 mL/hr over 120 Minutes  Intravenous  Once 12/01/21 1610 12/01/21 2045   11/30/21 1630  Ampicillin-Sulbactam (UNASYN) 3 g in sodium chloride 0.9 % 100 mL IVPB  Status:  Discontinued        3 g 200 mL/hr over 30 Minutes Intravenous Every 6 hours 11/30/21 1621 12/05/21 1305   11/30/21 1630  azithromycin (ZITHROMAX) 500 mg in sodium chloride 0.9 % 250 mL IVPB  Status:  Discontinued        500 mg 250 mL/hr over 60 Minutes Intravenous Every 24 hours 11/30/21 1622 12/01/21 0832   11/30/21 0930  Ampicillin-Sulbactam (UNASYN) 3 g in sodium chloride 0.9 % 100 mL IVPB        3 g 200 mL/hr over 30 Minutes Intravenous  Once 11/30/21 0923 11/30/21 1120          Medications  amLODipine  10 mg Oral Daily   arformoterol  15 mcg Nebulization BID   atorvastatin  40 mg Oral Daily   budesonide (PULMICORT) nebulizer solution  0.25 mg Nebulization BID   finasteride  5 mg Oral Daily   gabapentin  300 mg Oral BID   guaiFENesin  400 mg Oral BID   heparin  5,000 Units Subcutaneous Q8H   metoprolol tartrate  25 mg Oral BID   nicotine  21 mg Transdermal Daily   senna-docusate  1 tablet Oral BID   sertraline  50 mg Oral Daily   tamsulosin  0.4 mg Oral QHS      Subjective:   Kyle Lewis was seen and examined today.   Wbc normalized  C/o back pain and left shoulder pain,  Appear drowsy this am,  Wife at bedside    Currently on 3liter oxygen, denies sob, no chest pain, reports chronic cough at baseline, no edema, no fever    Objective:   Vitals:   12/09/21 2308 12/09/21 2315 12/10/21 0458 12/10/21 0742  BP: 140/63  120/64   Pulse:   66   Resp: 20  17   Temp: 98.1 F (36.7 C)  99.7 F (37.6 C)   TempSrc: Oral  Axillary   SpO2: (!) 88% 93% 93% 96%  Weight:   97.4 kg   Height:        Intake/Output Summary (Last 24 hours) at 12/10/2021 0923 Last data filed at 12/10/2021 0503 Gross per 24 hour  Intake 1394.51 ml  Output 2300 ml  Net -905.49 ml  Wt Readings from Last 3 Encounters:  12/10/21 97.4 kg   11/25/21 97.8 kg  11/09/21 97.8 kg    Physical Exam General: Alert and oriented x 3, NAD, slightly impaired hearing  Cardiovascular: S1 S2 clear, RRR. Respiratory: CTAB, no wheezing Gastrointestinal: Soft, nontender, nondistended, NBS Ext: no pedal edema bilaterally, scatter ecchymosis on arms/legs, left shoulder range of motion limited due to pain Neuro: no new deficits Skin: rash on right upper chest, minimal rash on top of left foot, reports not pruritic, nontender   Data Reviewed:  I have personally reviewed following labs    CBC Lab Results  Component Value Date   WBC 10.3 12/09/2021   RBC 3.75 (L) 12/09/2021   HGB 12.0 (L) 12/09/2021   HCT 35.5 (L) 12/09/2021   MCV 94.7 12/09/2021   MCH 32.0 12/09/2021   PLT 210 12/09/2021   MCHC 33.8 12/09/2021   RDW 14.1 12/09/2021   LYMPHSABS 0.9 12/09/2021   MONOABS 0.8 12/09/2021   EOSABS 0.1 12/09/2021   BASOSABS 0.0 00/76/2263     Last metabolic panel Lab Results  Component Value Date   NA 135 12/09/2021   K 4.4 12/09/2021   CL 95 (L) 12/09/2021   CO2 33 (H) 12/09/2021   BUN 15 12/09/2021   CREATININE 0.72 12/09/2021   GLUCOSE 115 (H) 12/09/2021   GFRNONAA >60 12/09/2021   GFRAA 107 04/08/2020   CALCIUM 8.3 (L) 12/09/2021   PROT 5.8 (L) 12/01/2021   ALBUMIN 2.4 (L) 12/01/2021   LABGLOB 2.7 09/25/2019   AGRATIO 1.4 09/25/2019   BILITOT 0.8 12/01/2021   ALKPHOS 68 12/01/2021   AST 15 12/01/2021   ALT 18 12/01/2021   ANIONGAP 7 12/09/2021    CBG (last 3)  Recent Labs    12/08/21 0702 12/09/21 0752 12/10/21 0646  GLUCAP 123* 120* 133*        Florencia Reasons M.D. PhD FACP [facility] 12/10/2021, 9:23 AM  Available via Epic secure chat 7am-7pm After 7 pm, please refer to night coverage provider listed on amion.

## 2021-12-10 NOTE — Progress Notes (Signed)
Progress Note  Patient Name: Kyle Lewis Date of Encounter: 12/10/2021  [facility] Cardiologist: Jenean Lindau, MD   Subjective   Complains of back pain but no other acute symptoms.  Inpatient Medications    Scheduled Meds:  amLODipine  10 mg Oral Daily   arformoterol  15 mcg Nebulization BID   atorvastatin  40 mg Oral Daily   budesonide (PULMICORT) nebulizer solution  0.25 mg Nebulization BID   finasteride  5 mg Oral Daily   gabapentin  300 mg Oral BID   guaiFENesin  400 mg Oral BID   heparin  5,000 Units Subcutaneous Q8H   metoprolol tartrate  25 mg Oral BID   nicotine  21 mg Transdermal Daily   polyethylene glycol  17 g Oral Daily   senna-docusate  1 tablet Oral BID   sertraline  50 mg Oral Daily   tamsulosin  0.4 mg Oral QHS   Continuous Infusions:  sodium chloride Stopped (12/09/21 0855)   gentamicin Stopped (12/09/21 1746)   penicillin G potassium 12 Million Units in dextrose 5 % 500 mL continuous infusion 12 Million Units (12/10/21 1028)   PRN Meds: sodium chloride, acetaminophen, albuterol, hydrALAZINE, morphine injection, naLOXone (NARCAN)  injection, ondansetron **OR** ondansetron (ZOFRAN) IV, oxyCODONE-acetaminophen   Vital Signs    Vitals:   12/09/21 2315 12/10/21 0458 12/10/21 0742 12/10/21 1015  BP:  120/64  (!) 159/75  Pulse:  66  66  Resp:  17  20  Temp:  99.7 F (37.6 C)  98.1 F (36.7 C)  TempSrc:  Axillary  Oral  SpO2: 93% 93% 96% 98%  Weight:  97.4 kg    Height:        Intake/Output Summary (Last 24 hours) at 12/10/2021 1101 Last data filed at 12/10/2021 1011 Gross per 24 hour  Intake 1394.51 ml  Output 2100 ml  Net -705.49 ml      12/10/2021    4:58 AM 12/09/2021    5:00 AM 12/08/2021    9:30 AM  Last 3 Weights  Weight (lbs) 214 lb 11.7 oz 218 lb 11.1 oz 218 lb 14.7 oz  Weight (kg) 97.4 kg 99.2 kg 99.3 kg      Telemetry    Sinus rhythm- Personally Reviewed  ECG    None new- Personally Reviewed  Physical Exam    GEN: No acute distress.   Neck: No JVD Cardiac: RRR, no murmurs, rubs, or gallops.  Respiratory: Clear to auscultation bilaterally. GI: Soft, nontender, non-distended  MS: No edema; No deformity. Neuro:  Nonfocal  Psych: Normal affect   Labs    High Sensitivity Troponin:   Recent Labs  Lab 11/30/21 1000 11/30/21 1939 11/30/21 2150  TROPONINIHS 21* 20* 21*     Chemistry Recent Labs  Lab 12/07/21 0942 12/08/21 0238 12/09/21 0304  NA 137 136 135  K 4.5 4.7 4.4  CL 96* 92* 95*  CO2 36* 32 33*  GLUCOSE 125* 115* 115*  BUN '16 20 15  '$ CREATININE 0.73 0.84 0.72  CALCIUM 8.7* 8.8* 8.3*  MG 2.3  --  2.2  GFRNONAA >60 >60 >60  ANIONGAP '5 12 7    '$ Lipids No results for input(s): "CHOL", "TRIG", "HDL", "LABVLDL", "LDLCALC", "CHOLHDL" in the last 168 hours.  Hematology Recent Labs  Lab 12/07/21 0942 12/08/21 0238 12/09/21 0304  WBC 10.6* 12.7* 10.3  RBC 4.26 4.07* 3.75*  HGB 13.2 12.8* 12.0*  HCT 40.3 38.6* 35.5*  MCV 94.6 94.8 94.7  MCH 31.0 31.4  32.0  MCHC 32.8 33.2 33.8  RDW 13.8 14.0 14.1  PLT 212 211 210   Thyroid No results for input(s): "TSH", "FREET4" in the last 168 hours.  BNPNo results for input(s): "BNP", "PROBNP" in the last 168 hours.  DDimer No results for input(s): "DDIMER" in the last 168 hours.   Radiology    MR LUMBAR SPINE WO CONTRAST  Result Date: 12/10/2021 CLINICAL DATA:  Epidural abscess EXAM: MRI LUMBAR SPINE WITHOUT CONTRAST TECHNIQUE: Multiplanar, multisequence MR imaging of the lumbar spine was performed. No intravenous contrast was administered. COMPARISON:  12/07/2021 FINDINGS: Segmentation:  Standard. Alignment: Grade 1 retrolisthesis at L2-3 and L3-4. Grade 1 anterolisthesis at L4-5 Vertebrae: Posterior instrumented fusion from L2-L5 with interbody spacer at L4-5. There is unchanged edema in the L5-S1 disc space but no endplate edema. Ventral epidural collection at the L5-S1 level is unchanged. Conus medullaris and cauda equina: Conus  extends to the L1 level. Conus and cauda equina appear normal. Paraspinal and other soft tissues: Large amount of magnetic susceptibility effects within the abdomen, likely an abdominal aortic aneurysm repair. Disc levels: L1-2: Disc space narrowing without spinal canal or neural foraminal stenosis. L2-3: Disc space narrowing without stenosis. L3-4: Disc space narrowing with postfusion changes and bilateral mild foraminal stenosis. L4-5: Posterior decompression. Grade 1 anterolisthesis. Mild bilateral foraminal stenosis. No spinal canal stenosis. L5-S1: Attenuation of the thecal sac due to ventral epidural collection. Moderate facet arthrosis. No bony spinal canal stenosis. Moderate right and mild left foraminal stenosis. IMPRESSION: 1. Unchanged ventral epidural collection at the L5-S1 level, which remains concerning for abscess. 2. Moderate right and mild left L5-S1 neural foraminal stenosis. 3. Mild bilateral L3-4 and L4-5 foraminal stenosis. Electronically Signed   By: Ulyses Jarred M.D.   On: 12/10/2021 00:00   DG FLUORO GUIDED NEEDLE PLC ASPIRATION/INJECTION LOC  Result Date: 12/09/2021 INDICATION: Left shoulder pain, concern for infection. EXAM: FLUORO GUIDED NEEDLE PLACEMENT AND/OR ASPIRATION COMPARISON:  MRI of the left shoulder 12/09/2021. MEDICATIONS: 1% lidocaine FLUOROSCOPY TIME:  42 seconds (1.70 mGy). COMPLICATIONS: None immediate TECHNIQUE: Soyla Dryer, NP obtained informed consent from the patient prior to the procedure. This process included a discussion of procedural risks. The patient was positioned supine on the fluoroscopy table and the left upper extremity was placed in a slight degree of external rotation. A skin entry site was determined under fluoroscopy and marked. A timeout was performed. The operator donned sterile gloves and a mask. The anterior aspect of the shoulder was prepped and draped in the usual sterile fashion. The overlying soft tissues were anesthetized with 1%  lidocaine. Under intermittent fluoroscopic guidance, a 22 gauge spinal needle was advanced into the glenohumeral joint space. 4 mL of fluid was aspirated and sent to the laboratory for analysis. The needle was withdrawn and a dressing was placed at the skin entry site. The patient tolerated the procedure well without immediate post-procedure complication. The procedure was performed by Soyla Dryer, NP, and supervised and interpreted by Dr. Kellie Simmering. IMPRESSION: 1. Fluoroscopically-guided left shoulder joint aspiration yielding 4 mL of blood-tinged fluid. 2. No immediate post-procedure complication. Electronically Signed   By: Kellie Simmering D.O.   On: 12/09/2021 15:26   MR Shoulder Left W Wo Contrast  Result Date: 12/09/2021 CLINICAL DATA:  Septic arthritis suspected, shoulder. Muscle weakness. EXAM: MRI OF THE LEFT SHOULDER WITHOUT AND WITH CONTRAST TECHNIQUE: Multiplanar, multisequence MR imaging of the left shoulder was performed before and after the administration of intravenous contrast. CONTRAST:  41m GADAVIST  GADOBUTROL 1 MMOL/ML IV SOLN COMPARISON:  Limited correlation made with chest CTA 11/30/2021. FINDINGS: Despite efforts by the technologist and patient, mild motion artifact is present on today's exam and could not be eliminated. This reduces exam sensitivity and specificity. Rotator cuff: Diffuse rotator cuff tendinosis, especially within the supraspinatus and infraspinatus tendons. Partial intrasubstance tearing of the distal infraspinatus tendon. No full-thickness tendon tear or tendon retraction. Muscles: No significant focal rotator cuff muscular atrophy identified. There is mild edema and enhancement throughout the rotator cuff musculature without focal intramuscular fluid collection. Biceps long head: Tendinosis of the intra-articular portion. The tendon is normally located in the bicipital groove. Acromioclavicular Joint: The acromion is type 2. There are moderate acromioclavicular  degenerative changes without evidence of septic arthritis at the acromioclavicular joint. There is a small amount of fluid in the subacromial-subdeltoid bursa. There is diffuse enhancement throughout the bursa following contrast. Glenohumeral Joint: Moderate-sized complex shoulder joint effusion with most of the fluid anteriorly within the superior subscapularis recess. There is diffuse synovial enhancement of this joint fluid. Advanced glenohumeral degenerative changes with diffuse chondral thinning and osteophytes of the humeral head. There is remodeling of the glenoid with subchondral cyst formation inferiorly. No suspicious erosions or osseous enhancement to suggest osteomyelitis. Labrum: Diffuse labral degeneration, especially posteriorly. Bones: As above, advanced glenohumeral degenerative changes. No evidence of acute fracture. No specific signs of osteomyelitis. Other: Peripheral to the joint, no definite fluid collections are identified. There is generalized soft tissue edema surrounding the shoulder. IMPRESSION: 1. Complex shoulder joint effusion with diffuse synovial enhancement, especially within the superior subscapularis recess. Findings are worrisome for septic arthritis or other inflammatory arthropathy. 2. No specific evidence of osteomyelitis. 3. Advanced glenohumeral degenerative changes with diffuse labral degeneration. 4. Diffuse rotator cuff tendinosis with partial infraspinatus tendon tearing. No full-thickness rotator cuff tear or focal muscular atrophy. Electronically Signed   By: Richardean Sale M.D.   On: 12/09/2021 09:59   ECHO TEE  Result Date: 12/08/2021    TRANSESOPHOGEAL ECHO REPORT   Patient Name:   Kyle Lewis Date of Exam: 12/08/2021 Medical Rec #:  [id number]       Height:       68.0 in Accession #:    [id number]      Weight:       218.9 lb Date of Birth:  1945-08-01      BSA:          2.124 m Patient Age:    28 years        BP:           91/42 mmHg Patient Gender: M                HR:           66 bpm. Exam Location:  Inpatient Procedure: Transesophageal Echo, Color Doppler, 3D Echo and Cardiac Doppler Indications:     bacteremia  History:         Patient has prior history of Echocardiogram examinations, most                  recent 12/06/2021. COPD; Risk Factors:Hypertension, Dyslipidemia,                  Former Smoker and Sleep Apnea.                  Aortic Valve: 29 mm Edwards TAVR valve is present in the aortic  position.  Sonographer:     Johny Chess RDCS Referring Phys:  [id number] Margie Billet Diagnosing Phys: Berniece Salines DO PROCEDURE: After discussion of the risks and benefits of a TEE, an informed consent was obtained from the patient. The transesophogeal probe was passed without difficulty through the esophogus of the patient. Imaged were obtained with the patient in a left lateral decubitus position. Local oropharyngeal anesthetic was provided with viscous lidocaine. Sedation performed by different physician. Image quality was adequate. The patient developed no complications during the procedure. IMPRESSIONS  1. There is a highly mobile mass on the base of the left coronary cusp highly suspicious for a vegetation. No appreciation of an abcess. Consider cardiac CTA if need for further evaluation. The aortic valve has been repaired/replaced. Aortic valve regurgitation is trivial. No aortic stenosis is present. There is a 29 mm Edwards TAVR valve present in the aortic position.  2. Left ventricular ejection fraction, by estimation, is 60 to 65%. The left ventricle has normal function.  3. Right ventricular systolic function is normal. The right ventricular size is normal.  4. No left atrial/left atrial appendage thrombus was detected. The LAA emptying velocity was 59 cm/s.  5. The mitral valve is normal in structure. Mild mitral valve regurgitation.  6. Tricuspid leaflets appears to be mildly thickened.  7. There is mild (Grade II) layered plaque.  8. Mildly  dilated pulmonary artery. Conclusion(s)/Recommendation(s): Findings concerning for aortic valve vegetation. Consider cardiac cta for further clarification. FINDINGS  Left Ventricle: Left ventricular ejection fraction, by estimation, is 60 to 65%. The left ventricle has normal function. The left ventricular internal cavity size was normal in size. Right Ventricle: The right ventricular size is normal. No increase in right ventricular wall thickness. Right ventricular systolic function is normal. Left Atrium: Left atrial size was not well visualized. No left atrial/left atrial appendage thrombus was detected. The LAA emptying velocity was 59 cm/s. Right Atrium: Right atrial size was not well visualized. Mitral Valve: The mitral valve is normal in structure. Mild mitral valve regurgitation. Tricuspid Valve: Tricuspid leaflets appears to be mildly thickened. Tricuspid valve regurgitation is mild . No evidence of tricuspid stenosis. Aortic Valve: There is a highly mobile mass on the base of the left coronary cusp highly suspicious for a vegetation. No appreciation of an abcess. Consider cardiac CTA if need for further evaluation. The aortic valve has been repaired/replaced. Aortic valve regurgitation is trivial. No aortic stenosis is present. Aortic valve mean gradient measures 12.0 mmHg. Aortic valve peak gradient measures 23.4 mmHg. Aortic valve area, by VTI measures 1.75 cm. There is a 29 mm Edwards TAVR valve present in the aortic position. Pulmonic Valve: The pulmonic valve was normal in structure. Pulmonic valve regurgitation is not visualized. Aorta: The aortic root and ascending aorta are structurally normal, with no evidence of dilitation. There is mild (Grade II) layered plaque. Pulmonary Artery: The pulmonary artery is mildly dilated. Venous: The left upper pulmonary vein, left lower pulmonary vein, right upper pulmonary vein and right lower pulmonary vein are normal. A normal flow pattern is recorded from  the left upper pulmonary vein. IAS/Shunts: No atrial level shunt detected by color flow Doppler.  LEFT VENTRICLE PLAX 2D LVOT diam:     2.10 cm LV SV:         76 LV SV Index:   36 LVOT Area:     3.46 cm  AORTIC VALVE AV Area (Vmax):    1.50 cm AV Area (Vmean):   1.24  cm AV Area (VTI):     1.75 cm AV Vmax:           242.00 cm/s AV Vmean:          157.000 cm/s AV VTI:            0.431 m AV Peak Grad:      23.4 mmHg AV Mean Grad:      12.0 mmHg LVOT Vmax:         105.00 cm/s LVOT Vmean:        56.400 cm/s LVOT VTI:          0.218 m LVOT/AV VTI ratio: 0.51  SHUNTS Systemic VTI:  0.22 m Systemic Diam: 2.10 cm Kardie Tobb DO Electronically signed by Berniece Salines DO Signature Date/Time: 12/08/2021/3:22:09 PM    Final     Cardiac Studies   TEE  1. There is a highly mobile mass on the base of the left coronary cusp  highly suspicious for a vegetation. No appreciation of an abcess. Consider  cardiac CTA if need for further evaluation. The aortic valve has been  repaired/replaced. Aortic valve  regurgitation is trivial. No aortic stenosis is present. There is a 29 mm  Edwards TAVR valve present in the aortic position.   2. Left ventricular ejection fraction, by estimation, is 60 to 65%. The  left ventricle has normal function.   3. Right ventricular systolic function is normal. The right ventricular  size is normal.   4. No left atrial/left atrial appendage thrombus was detected. The LAA  emptying velocity was 59 cm/s.   5. The mitral valve is normal in structure. Mild mitral valve  regurgitation.   6. Tricuspid leaflets appears to be mildly thickened.   7. There is mild (Grade II) layered plaque.   8. Mildly dilated pulmonary artery.   Patient Profile     76 y.o. 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  Suspect poor dentition.  Antibiotics per ID.  We [name] arrange for CTA on Monday for further delineation of infection.  2.  Epidural abscess: Plan per neurosurgery  3.  Possible septic arthritis: Underwent fluoroscopic guidance shoulder aspiration.  4.  Nonobstructive coronary artery disease: No current chest pain.    Cardiology to see as needed through the weekend.  For questions or updates, please contact [place] Please consult [email or web address] for contact info under        Signed, Fonnie Crookshanks Meredith Leeds, MD  12/10/2021, 11:01 AM

## 2021-12-10 NOTE — Progress Notes (Signed)
    Subjective:    Patient reports pain as moderate and more painful since he had the joint aspiration. 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 Pain with active motion. He denies tingling and numbness in LUE.   Objective:   VITALS:   Vitals:   12/09/21 2315 12/10/21 0458 12/10/21 0742 12/10/21 1015  BP:  120/64  (!) 159/75  Pulse:  66  66  Resp:  17  20  Temp:  99.7 F (37.6 C)  98.1 F (36.7 C)  TempSrc:  Axillary  Oral  SpO2: 93% 93% 96% 98%  Weight:  97.4 kg    Height:        Patient lying in bed with wife at bedside. NAD.  Distal pulses 2+ bilaterally. Sensation and motor function intact in LUE but limited due to pain with shoulder motion.  He has bandaid from IR aspiration.  No tenderness to palpation of bony prominences. Has rash from drug eruption on chest that has extended onto the proximal aspect of his shoulder.    Lab Results  Component Value Date   WBC 10.3 12/09/2021   HGB 12.0 (L) 12/09/2021   HCT 35.5 (L) 12/09/2021   MCV 94.7 12/09/2021   PLT 210 12/09/2021   BMET    Component Value Date/Time   NA 135 12/09/2021 0304   NA 145 (H) 07/13/2021 1628   K 4.4 12/09/2021 0304   CL 95 (L) 12/09/2021 0304   CO2 33 (H) 12/09/2021 0304   GLUCOSE 115 (H) 12/09/2021 0304   BUN 15 12/09/2021 0304   BUN 23 07/13/2021 1628   CREATININE 0.72 12/09/2021 0304   CREATININE 0.79 06/14/2021 1523   CALCIUM 8.3 (L) 12/09/2021 0304   EGFR 93 07/13/2021 1628   GFRNONAA >60 12/09/2021 0304   GFRNONAA 93 04/08/2020 1506     Assessment/Plan: 2 Days Post-Op   Principal Problem:   Prosthetic valve endocarditis (HCC) Active Problems:   OSA (obstructive sleep apnea)   Tobacco use   Hyperlipidemia LDL goal <70   S/P TAVR (transcatheter aortic valve replacement)   CAP (community acquired pneumonia)   Accelerated hypertension   Acute respiratory failure with hypoxia ([place])   COPD with acute exacerbation (HCC)  MRI of left shoulder 12/09/21 showed effusion that is  likely consistent with his severe osteoarthritis and not likely active infection. He had a IR left glenohumeral joint aspiration yesterday. Synovial counts from left glenohumeral joint aspiration show increased neutrophils (92) and increased WBC (27,500). Cultures are pending. Waiting for final cultures results to see how to proceed. Patient to follow-up with orthopedics as needed.    Charlott Rakes, PA-C 12/10/2021, 4:55 PM  [facility]  [facility] [address], [address], [place], [place] [address]

## 2021-12-11 DIAGNOSIS — T826XXA Infection and inflammatory reaction due to cardiac valve prosthesis, initial encounter: Secondary | ICD-10-CM | POA: Diagnosis not present

## 2021-12-11 DIAGNOSIS — I38 Endocarditis, valve unspecified: Secondary | ICD-10-CM | POA: Diagnosis not present

## 2021-12-11 LAB — BASIC METABOLIC PANEL
Anion gap: 9 (ref 5–15)
BUN: 20 mg/dL (ref 8–23)
CO2: 31 mmol/L (ref 22–32)
Calcium: 8.2 mg/dL — ABNORMAL LOW (ref 8.9–10.3)
Chloride: 95 mmol/L — ABNORMAL LOW (ref 98–111)
Creatinine, Ser: 0.85 mg/dL (ref 0.61–1.24)
GFR, Estimated: >60 mL/min (ref >60)
Glucose, Bld: 149 mg/dL — ABNORMAL HIGH (ref 70–99)
Potassium: 4.5 mmol/L (ref 3.5–5.1)
Sodium: 135 mmol/L (ref 135–145)

## 2021-12-11 LAB — GLUCOSE, CAPILLARY
Glucose-Capillary: 187 mg/dL — ABNORMAL HIGH (ref 70–99)
Glucose-Capillary: 195 mg/dL — ABNORMAL HIGH (ref 70–99)

## 2021-12-11 NOTE — Progress Notes (Signed)
[facility]                                                                              Kyle Lewis, is a 76 y.o. male, DOB - 01/28/1946, YQM:[id number] Admit date - 11/30/2021    Outpatient Primary MD for the patient is Kyle Honour, MD  LOS - 11  days  Chief Complaint  Patient presents with   Back Pain       Brief summary   Patient is a 76 year old male with hypertension, HLD,  COPD on home 02 prn, smoker, OSA,on cpap CAD, AAA repair, TAVR due to aortic stenosis (06/2021),  chronic back pain presented to ED with worsening back pain and found to have hypoxia/wheezing on presentation  desatted to 70% in the ED.  He was put on NRB and admitted   Assessment & Plan      Acute on chronic respiratory failure with hypoxia ([place]) likely due to Community-acquired pneumonia and COPD exacerbation -received abx, nebs, steroids, wheezing resolved, now back on baseline o2,   Gemella sanguinis bacteremia , Source suspected to be odontogenic from poor dentition, wife also reports he never cleans his bipap at home, CT maxillofacial showed no dental abscess TAVR endocarditis, management per cards, appreciate assistance  Septic arthritis ?( left shoulder ) vs degenerative changes, s/p drain, gram statin negative, appreciate ortho input Epidural abscess , seen by neurosurgery no surgical intervention recommended at this time -currently on  penicillin and gentamicin  per ID -Pain control, states Percocet works well  AAA repair -CTA noted no acute dissection or acute aortic process.  A tiny endoleak in the anterior endosac would be difficult to exclude, smaller than on previous imaging, potentially representing calcification, recommended follow-up -cardiology officially consulted     Accelerated hypertension -BP stable on current regimen    Hyperlipidemia LDL goal <70 -Continue statin   OSA (obstructive sleep apnea) -Continue CPAP    Tobacco use -Counseled smoking  cessation     Left Renal cyst - incidentally seen on CT, recommended 3-6 month follow-up    Obesity Estimated body mass index is 33.09 kg/m as calculated from the following:   Height as of this encounter: '5\' 8"'$  (1.727 m).   Weight as of this encounter: 98.7 kg.  OSA , reports use bipap at night at home, reports use home o2 prn at home  FTT; Reports baseline imbalance, use walker/cane  Code Status: Full code DVT Prophylaxis:  heparin injection 5,000 Units Start: 11/30/21 1630   Level of Care: Level of care: Progressive Family Communication:  wife at bedside on 7/6, 7/8, son at bedside on 7/9 Disposition Plan:     currently not ready to discharge, Plan fo SNF  eventually when medically ready     Procedures:  TEE  Consultants:   Infectious disease Cardiology Neurosurgery Ortho   Antimicrobials:   Anti-infectives (From admission, onward)    Start     Dose/Rate Route Frequency Ordered Stop   12/09/21 1800  gentamicin (GARAMYCIN) 200 mg in dextrose 5 % 50 mL IVPB        2.5 mg/kg  80.6 kg (Adjusted) 110 mL/hr  over 30 Minutes Intravenous Every 24 hours 12/09/21 0853     12/05/21 1800  gentamicin (GARAMYCIN) 240 mg in dextrose 5 % 50 mL IVPB  Status:  Discontinued        3 mg/kg  80.6 kg (Adjusted) 112 mL/hr over 30 Minutes Intravenous Every 24 hours 12/05/21 1317 12/09/21 0853   12/05/21 1400  penicillin G potassium 12 Million Units in dextrose 5 % 500 mL continuous infusion        12 Million Units 41.7 mL/hr over 12 Hours Intravenous Every 12 hours 12/05/21 1305     12/02/21 1800  vancomycin (VANCOREADY) IVPB 1250 mg/250 mL  Status:  Discontinued        1,250 mg 166.7 mL/hr over 90 Minutes Intravenous Every 24 hours 12/01/21 1610 12/05/21 1305   12/01/21 1800  azithromycin (ZITHROMAX) tablet 500 mg        500 mg Oral Every evening 12/01/21 0832 12/04/21 1714   12/01/21 1700  vancomycin (VANCOREADY) IVPB 1750 mg/350 mL        1,750 mg 175 mL/hr over 120 Minutes  Intravenous  Once 12/01/21 1610 12/01/21 2045   11/30/21 1630  Ampicillin-Sulbactam (UNASYN) 3 g in sodium chloride 0.9 % 100 mL IVPB  Status:  Discontinued        3 g 200 mL/hr over 30 Minutes Intravenous Every 6 hours 11/30/21 1621 12/05/21 1305   11/30/21 1630  azithromycin (ZITHROMAX) 500 mg in sodium chloride 0.9 % 250 mL IVPB  Status:  Discontinued        500 mg 250 mL/hr over 60 Minutes Intravenous Every 24 hours 11/30/21 1622 12/01/21 0832   11/30/21 0930  Ampicillin-Sulbactam (UNASYN) 3 g in sodium chloride 0.9 % 100 mL IVPB        3 g 200 mL/hr over 30 Minutes Intravenous  Once 11/30/21 0923 11/30/21 1120          Medications  amLODipine  10 mg Oral Daily   arformoterol  15 mcg Nebulization BID   atorvastatin  40 mg Oral Daily   budesonide (PULMICORT) nebulizer solution  0.25 mg Nebulization BID   finasteride  5 mg Oral Daily   gabapentin  300 mg Oral BID   guaiFENesin  400 mg Oral BID   heparin  5,000 Units Subcutaneous Q8H   metoprolol tartrate  25 mg Oral BID   nicotine  21 mg Transdermal Daily   polyethylene glycol  17 g Oral Daily   senna-docusate  1 tablet Oral BID   sertraline  50 mg Oral Daily   tamsulosin  0.4 mg Oral QHS      Subjective:    No significant interval changes, son at bedside   Wbc normalized  C/o back pain and left shoulder pain,    Currently on 3liter oxygen, denies sob, no chest pain, reports chronic cough at baseline, no edema, no fever    Objective:   Vitals:   12/11/21 0855 12/11/21 1222 12/11/21 1955 12/11/21 1956  BP: 125/63 (!) 126/58    Pulse:  63 61   Resp:  18 (!) 21   Temp:  97.8 F (36.6 C)    TempSrc:  Oral    SpO2:  98% 94% 95%  Weight:      Height:        Intake/Output Summary (Last 24 hours) at 12/11/2021 2033 Last data filed at 12/11/2021 1800 Gross per 24 hour  Intake 1681.31 ml  Output 1800 ml  Net -118.69 ml  Wt Readings from Last 3 Encounters:  12/11/21 98.7 kg  11/25/21 97.8 kg  11/09/21  97.8 kg    Physical Exam General: Alert and oriented x 3, NAD, slightly impaired hearing  Cardiovascular: S1 S2 clear, RRR. Respiratory: CTAB, no wheezing Gastrointestinal: Soft, nontender, nondistended, NBS Ext: no pedal edema bilaterally, scatter ecchymosis on arms/legs, left shoulder range of motion limited due to pain Neuro: no new deficits Skin: rash on right upper chest, minimal rash on top of left foot, reports not pruritic, nontender   Data Reviewed:  I have personally reviewed following labs    CBC Lab Results  Component Value Date   WBC 10.3 12/09/2021   RBC 3.75 (L) 12/09/2021   HGB 12.0 (L) 12/09/2021   HCT 35.5 (L) 12/09/2021   MCV 94.7 12/09/2021   MCH 32.0 12/09/2021   PLT 210 12/09/2021   MCHC 33.8 12/09/2021   RDW 14.1 12/09/2021   LYMPHSABS 0.9 12/09/2021   MONOABS 0.8 12/09/2021   EOSABS 0.1 12/09/2021   BASOSABS 0.0 16/03/9603     Last metabolic panel Lab Results  Component Value Date   NA 135 12/11/2021   K 4.5 12/11/2021   CL 95 (L) 12/11/2021   CO2 31 12/11/2021   BUN 20 12/11/2021   CREATININE 0.85 12/11/2021   GLUCOSE 149 (H) 12/11/2021   GFRNONAA >60 12/11/2021   GFRAA 107 04/08/2020   CALCIUM 8.2 (L) 12/11/2021   PROT 5.8 (L) 12/01/2021   ALBUMIN 2.4 (L) 12/01/2021   LABGLOB 2.7 09/25/2019   AGRATIO 1.4 09/25/2019   BILITOT 0.8 12/01/2021   ALKPHOS 68 12/01/2021   AST 15 12/01/2021   ALT 18 12/01/2021   ANIONGAP 9 12/11/2021    CBG (last 3)  Recent Labs    12/10/21 0646 12/11/21 1225 12/11/21 1610  GLUCAP 133* 187* 195*        Florencia Reasons M.D. PhD FACP [facility] 12/11/2021, 8:33 PM  Available via Epic secure chat 7am-7pm After 7 pm, please refer to night coverage provider listed on amion.

## 2021-12-11 NOTE — Progress Notes (Signed)
Overall stable.  Patient still with significant back pain.  Radicular pain minimal.  Still with good strength in both lower extremities.  Patient with bacterial endocarditis with multifocal secondary abscesses including a large ventral epidural abscess within his lumbar spine predominate the L5-S1 level.  Recommend continued aggressive treatment with antibiotics and observation.

## 2021-12-12 DIAGNOSIS — T826XXD Infection and inflammatory reaction due to cardiac valve prosthesis, subsequent encounter: Secondary | ICD-10-CM | POA: Diagnosis not present

## 2021-12-12 DIAGNOSIS — G062 Extradural and subdural abscess, unspecified: Secondary | ICD-10-CM | POA: Diagnosis not present

## 2021-12-12 DIAGNOSIS — M00812 Arthritis due to other bacteria, left shoulder: Secondary | ICD-10-CM | POA: Diagnosis not present

## 2021-12-12 DIAGNOSIS — I38 Endocarditis, valve unspecified: Secondary | ICD-10-CM | POA: Diagnosis not present

## 2021-12-12 DIAGNOSIS — Z952 Presence of prosthetic heart valve: Secondary | ICD-10-CM | POA: Diagnosis not present

## 2021-12-12 LAB — CBC WITH DIFFERENTIAL/PLATELET
Abs Immature Granulocytes: 0.1 10*3/uL — ABNORMAL HIGH (ref 0.00–0.07)
Basophils Absolute: 0 10*3/uL (ref 0.0–0.1)
Basophils Relative: 0 %
Eosinophils Absolute: 0.1 10*3/uL (ref 0.0–0.5)
Eosinophils Relative: 1 %
HCT: 36 % — ABNORMAL LOW (ref 39.0–52.0)
Hemoglobin: 11.8 g/dL — ABNORMAL LOW (ref 13.0–17.0)
Immature Granulocytes: 1 %
Lymphocytes Relative: 12 %
Lymphs Abs: 1.2 10*3/uL (ref 0.7–4.0)
MCH: 31.3 pg (ref 26.0–34.0)
MCHC: 32.8 g/dL (ref 30.0–36.0)
MCV: 95.5 fL (ref 80.0–100.0)
Monocytes Absolute: 0.8 10*3/uL (ref 0.1–1.0)
Monocytes Relative: 8 %
Neutro Abs: 8.1 10*3/uL — ABNORMAL HIGH (ref 1.7–7.7)
Neutrophils Relative %: 78 %
Platelets: 212 10*3/uL (ref 150–400)
RBC: 3.77 MIL/uL — ABNORMAL LOW (ref 4.22–5.81)
RDW: 14 % (ref 11.5–15.5)
WBC: 10.3 10*3/uL (ref 4.0–10.5)
nRBC: 0 % (ref 0.0–0.2)

## 2021-12-12 LAB — BASIC METABOLIC PANEL
Anion gap: 9 (ref 5–15)
BUN: 19 mg/dL (ref 8–23)
CO2: 31 mmol/L (ref 22–32)
Calcium: 8.4 mg/dL — ABNORMAL LOW (ref 8.9–10.3)
Chloride: 95 mmol/L — ABNORMAL LOW (ref 98–111)
Creatinine, Ser: 0.83 mg/dL (ref 0.61–1.24)
GFR, Estimated: >60 mL/min (ref >60)
Glucose, Bld: 165 mg/dL — ABNORMAL HIGH (ref 70–99)
Potassium: 4.9 mmol/L (ref 3.5–5.1)
Sodium: 135 mmol/L (ref 135–145)

## 2021-12-12 LAB — GLUCOSE, CAPILLARY
Glucose-Capillary: 146 mg/dL — ABNORMAL HIGH (ref 70–99)
Glucose-Capillary: 154 mg/dL — ABNORMAL HIGH (ref 70–99)
Glucose-Capillary: 114 mg/dL — ABNORMAL HIGH (ref 70–99)
Glucose-Capillary: 207 mg/dL — ABNORMAL HIGH (ref 70–99)

## 2021-12-12 MED ORDER — HYDROMORPHONE HCL 1 MG/ML IJ SOLN
0.5000 mg | INTRAMUSCULAR | Status: AC
  Administered 2021-12-12: 0.5 mg via INTRAVENOUS
  Filled 2021-12-12: qty 1

## 2021-12-12 NOTE — Progress Notes (Signed)
[facility] for Infectious Disease  Date of Admission:  11/30/2021           Reason for visit: Follow up on TAVR endocarditis  Current antibiotics: Penicillin Gentamicin   ASSESSMENT:    76 y.o. male admitted with:  Gemella sanguinis bacteremia and TAVR endocarditis:  Patient blood cultures are positive from 11/30/2021.  Noted to be highly sensitive to penicillin.  Repeat blood cultures 7/3 finalized as no growth.  TEE 7/6 notable for suspected vegetation.  Cardiac CTA pending today to evaluate annulus and rule out perivalvular abscess. Lumbar discitis and epidural abscess L5-S1: Patient with a history of prior spinal fusion L2-5.  Evaluated by neurosurgery and unclear whether surgical intervention would benefit him at this time versus continued medical management. Left shoulder septic arthritis: Status post aspiration 12/09/2021 with 27,000 neutrophil predominant WBC.  Cultures are negative, however, this was obtained several days after antibiotics.  Given overall presentation suspect this is infected.  Orthopedic surgery following.  No current plans for washout. History of aortic stenosis status post TAVR: Completed in January 2023. History of AAA: Status post endovascular repair in January 2019.  RECOMMENDATIONS:    Continue penicillin Continue gentamicin for synergy Cardiac CTA today Appreciate neurosurgery and orthopedic surgery follow-up Follow cultures Lab monitoring Will follow   Principal Problem:   Prosthetic valve endocarditis (HCC) Active Problems:   OSA (obstructive sleep apnea)   Tobacco use   Hyperlipidemia LDL goal <70   S/P TAVR (transcatheter aortic valve replacement)   CAP (community acquired pneumonia)   Accelerated hypertension   Acute respiratory failure with hypoxia (HCC)   COPD with acute exacerbation (HCC)    MEDICATIONS:    Scheduled Meds:  amLODipine  10 mg Oral Daily   arformoterol  15 mcg Nebulization BID   atorvastatin  40 mg  Oral Daily   budesonide (PULMICORT) nebulizer solution  0.25 mg Nebulization BID   finasteride  5 mg Oral Daily   gabapentin  300 mg Oral BID   guaiFENesin  400 mg Oral BID   heparin  5,000 Units Subcutaneous Q8H   metoprolol tartrate  25 mg Oral BID   nicotine  21 mg Transdermal Daily   polyethylene glycol  17 g Oral Daily   senna-docusate  1 tablet Oral BID   sertraline  50 mg Oral Daily   tamsulosin  0.4 mg Oral QHS   Continuous Infusions:  sodium chloride Stopped (12/09/21 0855)   gentamicin 200 mg (12/11/21 1842)   penicillin G potassium 12 Million Units in dextrose 5 % 500 mL continuous infusion 12 Million Units (12/12/21 0844)   PRN Meds:.sodium chloride, acetaminophen, albuterol, hydrALAZINE, morphine injection, naLOXone (NARCAN)  injection, ondansetron **OR** ondansetron (ZOFRAN) IV, oxyCODONE-acetaminophen  SUBJECTIVE:   24 hour events:  No significant events noted overnight  Patient continues to report back pain with movement.  No significant pain while sitting still.  He reports that his shoulder pain is really no better at this time.  Review of Systems  All other systems reviewed and are negative.     OBJECTIVE:   Blood pressure (!) 143/65, pulse 60, temperature 98.1 F (36.7 C), temperature source Oral, resp. rate (!) 21, height '5\' 8"'$  (1.727 m), weight 99.6 kg, SpO2 98 %. Body mass index is 33.39 kg/m.  Physical Exam Constitutional:      General: He is not in acute distress.    Appearance: Normal appearance.  HENT:     Head: Normocephalic and atraumatic.  Eyes:     Extraocular Movements: Extraocular movements intact.     Conjunctiva/sclera: Conjunctivae normal.  Pulmonary:     Effort: Pulmonary effort is normal. No respiratory distress.  Abdominal:     General: There is no distension.     Palpations: Abdomen is soft.  Musculoskeletal:        General: Tenderness present.     Comments: Decreased range of motion with his left shoulder  Skin:     General: Skin is warm and dry.  Neurological:     General: No focal deficit present.     Mental Status: He is alert and oriented to person, place, and time.  Psychiatric:        Mood and Affect: Mood normal.        Behavior: Behavior normal.      Lab Results: Lab Results  Component Value Date   WBC 10.3 12/12/2021   HGB 11.8 (L) 12/12/2021   HCT 36.0 (L) 12/12/2021   MCV 95.5 12/12/2021   PLT 212 12/12/2021    Lab Results  Component Value Date   NA 135 12/12/2021   K 4.9 12/12/2021   CO2 31 12/12/2021   GLUCOSE 165 (H) 12/12/2021   BUN 19 12/12/2021   CREATININE 0.83 12/12/2021   CALCIUM 8.4 (L) 12/12/2021   GFRNONAA >60 12/12/2021   GFRAA 107 04/08/2020    Lab Results  Component Value Date   ALT 18 12/01/2021   AST 15 12/01/2021   ALKPHOS 68 12/01/2021   BILITOT 0.8 12/01/2021    No results found for: "CRP"     Component Value Date/Time   ESRSEDRATE 8 09/25/2019 1110     I have reviewed the micro and lab results in Epic.  Imaging: No results found.   Imaging independently reviewed in Epic.    Raynelle Highland for Infectious Disease [facility] [phone] pager 12/12/2021, 11:10 AM

## 2021-12-12 NOTE — Progress Notes (Signed)
PROGRESS NOTE  Kyle Lewis YQM:[id number] DOB: December 10, 1945 DOA: 11/30/2021 PCP: [name], MD  HPI/Recap of past 24 hours: Kyle Lewis is a 76 y.o. male with medical history significant of obesity, essential hypertension, COPD, OSA on bipap, coronary artery disease, abdominal aortic aneurysm repair, TAVR, hyperlipidemia, ongoing tobacco use, chronic back pain, who presented to the ED from home via EMS with complaints of increase back pain from chronic baseline x 1 week duration, despite use of home ultram and prednisone from 11/25/21.  In addition to back pain, endorsed cough/chills.  +Wheezing as well as hypoxemia to 83% in triage.    Work-up revealed community-acquired pneumonia, COPD exacerbation, with associated acute hypoxic respiratory failure.    Hospital course complicated by Gemella Sanguinis bacteremia with source suspected to be odontogenic from poor dentition.  His wife also reports that he never cleans his BiPAP at home.  CT maxillofacial showed no dental abscess.  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  The patient is currently on penicillin and gentamicin for synergy as recommended by infectious disease.  Pain control and bowel regimen in place.  12/12/2021:  The patient was seen and examined at his bedside.  His wife was present in the room.  Very weak and in need of more physical therapy.  He has no other complaints.  His wife inquired of options for DC planning.  TOC consulted to assist.    Assessment/Plan: Principal Problem:   Prosthetic valve endocarditis (HCC) Active Problems:   OSA (obstructive sleep apnea)   Tobacco use   Hyperlipidemia LDL goal <70   S/P TAVR  (transcatheter aortic valve replacement)   CAP (community acquired pneumonia)   Accelerated hypertension   Acute respiratory failure with hypoxia ([place])   COPD with acute exacerbation (HCC)  Acute on chronic hypoxic and hypercarbic respiratory failure likely due to Community-acquired pneumonia and COPD exacerbation -received abx, nebs, steroids, wheezing resolved, now back on baseline O2, Continue home bronchodilators. Mobilize as tolerated Flutter valve.   Gemella sanguinis bacteremia Source suspected to be odontogenic from poor dentition, wife also reports he never cleans his bipap at home, CT maxillofacial showed no dental abscess The patient is currently on penicillin and gentamicin for synergy as recommended by infectious disease.  Pain control and bowel regimen in place. Appreciate infectious disease assistance with the management  TAVR endocarditis Management per cardiology, appreciate assistance  Continue penicillin and gentamicin for synergy.  Left shoulder septic arthritis vs degenerative changes s/p drain, gram statin negative, appreciate ortho input Joint fluid drained several days after starting antibiotics.  Epidural abscess Seen by neurosurgery no surgical intervention recommended at this time -currently on  penicillin and gentamicin per ID -Continue pain control, states Percocet works well   AAA repair -CTA noted no acute dissection or acute aortic process.  A tiny endoleak in the anterior endosac would be difficult to exclude, smaller than on previous imaging, potentially representing calcification, recommended follow-up -cardiology officially consulted    Accelerated hypertension BP is currently at goal. Continue home Norvasc, p.o. Lopressor 25 mg twice daily Continue to monitor vital signs    Hyperlipidemia LDL goal <70 -Continue statin    OSA (obstructive sleep apnea) -Continue BiPAP nightly Reports use bipap at night at home, reports use home o2 prn at  home     Tobacco use -Counseled smoking cessation   Left Renal cyst - incidentally seen on CT, recommended 3-6 month follow-up     Obesity Estimated body mass index is 33.09 kg/m as calculated from the following:   Height as of this encounter: '5\' 8"'$  (1.727 m).   Weight as of this encounter: 98.7 kg.   Ambulatory dysfunction/generalized weakness. PT OT to assess Fall precautions Uses a walker/cane at home.    Critical care time: 65 minutes.      Code Status: Full code DVT Prophylaxis:  heparin injection 5,000 Units Start: 11/30/21 1630     Level of Care: Level of care: Progressive Family Communication: Updated his wife at bedside. Disposition Plan:    Plan to discharge to SNF once infectious disease and cardiology have signed off    Procedures:  TEE   Consultants:   Infectious disease Cardiology Neurosurgery Ortho    Antimicrobials:    Status is: Inpatient The patient requires at least 2 midnights for further evaluation and treatment of present condition.    Objective: Vitals:   12/12/21 0758 12/12/21 0820 12/12/21 0822 12/12/21 1342  BP: 107/78 (!) 143/65  131/72  Pulse: 60  60 72  Resp: 18 (!) 21  19  Temp: 98.4 F (36.9 C)  98.1 F (36.7 C) 98 F (36.7 C)  TempSrc: Oral  Oral Oral  SpO2: 98%   95%  Weight:      Height:        Intake/Output Summary (Last 24 hours) at 12/12/2021 1456 Last data filed at 12/12/2021 1100 Gross per 24 hour  Intake 725.21 ml  Output 1550 ml  Net -824.79 ml   Filed Weights   12/10/21 0458 12/11/21 0500 12/12/21 0439  Weight: 97.4 kg 98.7 kg 99.6 kg    Exam:  General: 76 y.o. year-old male well developed well nourished in no acute distress.  Alert and oriented x3. Cardiovascular: Regular rate and rhythm with no rubs or gallops.  No thyromegaly or JVD noted.   Respiratory: Clear to auscultation with no wheezes or rales. Good inspiratory effort. Abdomen: Soft nontender nondistended with normal bowel sounds x4  quadrants. Musculoskeletal: Trace lower extremity edema. 2/4 pulses in all 4 extremities. Skin: No ulcerative lesions noted or rashes, Psychiatry: Mood is appropriate for condition and setting   Data Reviewed: CBC: Recent Labs  Lab 12/07/21 0942 12/08/21 0238 12/09/21 0304 12/12/21 0141  WBC 10.6* 12.7* 10.3 10.3  NEUTROABS  --  10.0* 8.3* 8.1*  HGB 13.2 12.8* 12.0* 11.8*  HCT 40.3 38.6* 35.5* 36.0*  MCV 94.6 94.8 94.7 95.5  PLT 212 211 210 222   Basic Metabolic Panel: Recent Labs  Lab 12/07/21 0942 12/08/21 0238 12/09/21 0304 12/11/21 0134 12/12/21 0141  NA 137 136 135 135 135  K 4.5 4.7 4.4 4.5 4.9  CL 96* 92* 95* 95* 95*  CO2 36* 32 33* 31 31  GLUCOSE 125* 115* 115* 149* 165*  BUN '16 20 15 20 19  '$ CREATININE 0.73 0.84 0.72 0.85 0.83  CALCIUM 8.7* 8.8* 8.3* 8.2* 8.4*  MG 2.3  --  2.2  --   --    GFR: Estimated Creatinine Clearance: 88 mL/min (by C-G formula based on SCr of 0.83 mg/dL). Liver Function Tests: No results for input(s): "AST", "ALT", "ALKPHOS", "BILITOT", "PROT", "ALBUMIN" in the last 168 hours. No results for input(s): "LIPASE", "AMYLASE" in the last 168 hours. No results for input(s): "AMMONIA" in the last 168 hours. Coagulation Profile: Recent  Labs  Lab 12/07/21 0942  INR 1.0   Cardiac Enzymes: No results for input(s): "CKTOTAL", "CKMB", "CKMBINDEX", "TROPONINI" in the last 168 hours. BNP (last 3 results) No results for input(s): "PROBNP" in the last 8760 hours. HbA1C: No results for input(s): "HGBA1C" in the last 72 hours. CBG: Recent Labs  Lab 12/10/21 0646 12/11/21 1225 12/11/21 1610 12/12/21 0755 12/12/21 1153  GLUCAP 133* 187* 195* 146* 154*   Lipid Profile: No results for input(s): "CHOL", "HDL", "LDLCALC", "TRIG", "CHOLHDL", "LDLDIRECT" in the last 72 hours. Thyroid Function Tests: No results for input(s): "TSH", "T4TOTAL", "FREET4", "T3FREE", "THYROIDAB" in the last 72 hours. Anemia Panel: No results for input(s):  "VITAMINB12", "FOLATE", "FERRITIN", "TIBC", "IRON", "RETICCTPCT" in the last 72 hours. Urine analysis:    Component Value Date/Time   COLORURINE YELLOW 06/07/2021 0445   APPEARANCEUR CLEAR 06/07/2021 0445   LABSPEC <1.005 (L) 06/07/2021 0445   PHURINE 7.0 06/07/2021 0445   GLUCOSEU NEGATIVE 06/07/2021 0445   HGBUR NEGATIVE 06/07/2021 0445   BILIRUBINUR Negative 06/14/2021 1605   KETONESUR NEGATIVE 06/07/2021 0445   PROTEINUR Positive (A) 06/14/2021 1605   PROTEINUR NEGATIVE 06/07/2021 0445   UROBILINOGEN 0.2 06/14/2021 1605   UROBILINOGEN 0.2 03/25/2012 1501   NITRITE Negative 06/14/2021 1605   NITRITE NEGATIVE 06/07/2021 0445   LEUKOCYTESUR Small (1+) (A) 06/14/2021 1605   LEUKOCYTESUR NEGATIVE 06/07/2021 0445   Sepsis Labs: '@LABRCNTIP'$ (procalcitonin:4,lacticidven:4)  ) Recent Results (from the past 240 hour(s))  Culture, blood (Routine X 2) w Reflex to ID Panel     Status: None   Collection Time: 12/05/21 12:27 PM   Specimen: BLOOD LEFT HAND  Result Value Ref Range Status   Specimen Description BLOOD LEFT HAND  Final   Special Requests   Final    BOTTLES DRAWN AEROBIC AND ANAEROBIC Blood Culture adequate volume   Culture   Final    NO GROWTH 5 DAYS Performed at [facility], [place] [address] [place], [place] [address]    Report Status 12/10/2021 FINAL  Final  Culture, blood (Routine X 2) w Reflex to ID Panel     Status: None   Collection Time: 12/05/21 12:28 PM   Specimen: BLOOD  Result Value Ref Range Status   Specimen Description BLOOD RIGHT ANTECUBITAL  Final   Special Requests   Final    BOTTLES DRAWN AEROBIC AND ANAEROBIC Blood Culture adequate volume   Culture   Final    NO GROWTH 5 DAYS Performed at [facility], [place] [address], [place], [place] [address]    Report Status 12/10/2021 FINAL  Final  Body fluid culture w Gram Stain     Status: None (Preliminary result)   Collection Time: 12/09/21  3:01 PM   Specimen: PATH Cytology Misc. fluid; Body  Fluid  Result Value Ref Range Status   Specimen Description FLUID  Final   Special Requests SYNOVIAL  Final   Gram Stain   Final    FEW WBC PRESENT, PREDOMINANTLY PMN NO ORGANISMS SEEN    Culture   Final    NO GROWTH 3 DAYS Performed at [facility], [address], [place], [place] [address]    Report Status PENDING  Incomplete      Studies: No results found.  Scheduled Meds:  amLODipine  10 mg Oral Daily   arformoterol  15 mcg Nebulization BID   atorvastatin  40 mg Oral Daily   budesonide (PULMICORT) nebulizer solution  0.25 mg Nebulization BID   finasteride  5 mg Oral Daily   gabapentin  300 mg Oral BID   guaiFENesin  400 mg Oral BID   heparin  5,000 Units Subcutaneous Q8H   metoprolol tartrate  25 mg Oral BID   nicotine  21 mg Transdermal Daily   polyethylene glycol  17 g Oral Daily   senna-docusate  1 tablet Oral BID   sertraline  50 mg Oral Daily   tamsulosin  0.4 mg Oral QHS    Continuous Infusions:  sodium chloride Stopped (12/09/21 0855)   gentamicin 200 mg (12/11/21 1842)   penicillin G potassium 12 Million Units in dextrose 5 % 500 mL continuous infusion 12 Million Units (12/12/21 0844)     LOS: 12 days     Kayleen Memos, MD [facility] Pager [phone]  If 7PM-7AM, please contact night-coverage [email or web address] Password [facility] 12/12/2021, 2:56 PM

## 2021-12-12 NOTE — Progress Notes (Signed)
Physical Therapy Treatment Patient Details Name: Kyle Lewis MRN: [id number] DOB: [birth year] Today's Date: 12/12/2021   History of Present Illness 76 y.o. male presents to [facility] hospital on 11/30/2021 with increase in back pain as well as cough and chills. 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 PMH includes HTN, COPD, OSA, CAD, AAA, chronic back pain.    PT Comments    Pt with decline in mobility since last seen. Pain in back and left shoulder resulting in significant limitations in mobility. Wife present and spoke about pt wanting to come home instead of SNF. Explained that at current level did not feel that pt could be cared for at home even with intermittent home health services. Explained would continue to recommend SNF for further rehab.    Recommendations for follow up therapy are one component of a multi-disciplinary discharge planning process, led by the attending physician.  Recommendations may be updated based on patient status, additional functional criteria and insurance authorization.  Follow Up Recommendations  Skilled nursing-short term rehab (<3 hours/day) Can patient physically be transported by private vehicle: No   Assistance Recommended at Discharge Frequent or constant Supervision/Assistance  Patient can return home with the following A lot of help with walking and/or transfers;A lot of help with bathing/dressing/bathroom;Assist for transportation;Assistance with cooking/housework;Help with stairs or ramp for entrance   Equipment Recommendations  None recommended by PT    Recommendations for Other Services       Precautions / Restrictions Precautions Precautions: Fall Restrictions Weight Bearing Restrictions: No     Mobility  Bed Mobility Overal bed mobility: Needs Assistance Bed Mobility: Rolling, Sidelying to Sit Rolling: Mod assist, +2 for safety/equipment Sidelying to sit: +2 for physical  assistance, Mod assist       General bed mobility comments: Assist to bring shoulders and hips over to roll. Assist to elevate trunk into sitting and bring hips to EOB    Transfers Overall transfer level: Needs assistance Equipment used: Rolling walker (2 wheels) Transfers: Sit to/from Stand Sit to Stand: +2 physical assistance, Min assist, Mod assist, From elevated surface           General transfer comment: +2 min assist to bring hips up from elevated bed. +2 mod assist to bring hips up using bed pad from low recliner    Ambulation/Gait Ambulation/Gait assistance: Min assist, Mod assist, +2 safety/equipment Gait Distance (Feet): 15 Feet (15' x 1, 5' x 1) Assistive device: Rolling walker (2 wheels) Gait Pattern/deviations: Step-through pattern, Decreased step length - right, Decreased step length - left, Shuffle, Trunk flexed Gait velocity: decr Gait velocity interpretation: <1.31 ft/sec, indicative of household ambulator   General Gait Details: Assist for balance and support. Pt leaning forward onto walker and at times propping forearms on walker   Stairs             Wheelchair Mobility    Modified Rankin (Stroke Patients Only)       Balance Overall balance assessment: Needs assistance Sitting-balance support: Feet supported, Bilateral upper extremity supported Sitting balance-Leahy Scale: Poor Sitting balance - Comments: UE support and initial min assist for trunk support Postural control: Posterior lean Standing balance support: Bilateral upper extremity supported, Reliant on assistive device for balance Standing balance-Leahy Scale: Poor Standing balance comment: walker and min assist for static standing  Cognition Arousal/Alertness: Awake/alert Behavior During Therapy: Flat affect Overall Cognitive Status: Impaired/Different from baseline Area of Impairment: Problem solving, Following commands                        Following Commands: Follows one step commands with increased time     Problem Solving: Slow processing, Difficulty sequencing, Requires verbal cues          Exercises      General Comments        Pertinent Vitals/Pain Pain Assessment Pain Assessment: 0-10 Pain Score: 10-Worst pain ever Pain Location: back and lt shoulder Pain Descriptors / Indicators: Grimacing, Guarding, Moaning Pain Intervention(s): Limited activity within patient's tolerance, Monitored during session, RN gave pain meds during session, Repositioned    Home Living                          Prior Function            PT Goals (current goals can now be found in the care plan section) Acute Rehab PT Goals Patient Stated Goal: Return home Progress towards PT goals: Not progressing toward goals - comment    Frequency    Min 2X/week      PT Plan Current plan remains appropriate    Co-evaluation              AM-PAC PT "6 Clicks" Mobility   Outcome Measure  Help needed turning from your back to your side while in a flat bed without using bedrails?: A Lot Help needed moving from lying on your back to sitting on the side of a flat bed without using bedrails?: Total Help needed moving to and from a bed to a chair (including a wheelchair)?: Total Help needed standing up from a chair using your arms (e.g., wheelchair or bedside chair)?: Total Help needed to walk in hospital room?: Total Help needed climbing 3-5 steps with a railing? : Total 6 Click Score: 7    End of Session Equipment Utilized During Treatment: Gait belt;Oxygen Activity Tolerance: Patient limited by pain Patient left: in chair;with call bell/phone within reach;with family/visitor present Nurse Communication: Mobility status PT Visit Diagnosis: Other abnormalities of gait and mobility (R26.89);Muscle weakness (generalized) (M62.81)     Time: 0263-7858 PT Time Calculation (min) (ACUTE ONLY): 29  min  Charges:  $Therapeutic Activity: 23-37 mins                     [place] 12/12/2021, 5:25 PM

## 2021-12-12 NOTE — Progress Notes (Signed)
Unfortunately his MRI was done without contrast once again.  I am not sure why.  My order was obviously for both with and without contrast.  Somehow this got changed.  I have reviewed the MRI and it appears unchanged from the previous.  There is a ventral epidural fluid collection consistent with abscess.  There is moderate stenosis but he has back pain without leg pain.  He has no pain at rest but has back pain that he says is 7 out of 10 and aching in character when up and walking.  Shoulder is really no better after aspiration.  It is so difficult to know whether surgical intervention at L5-S1 is warranted at this time, but given his lack of radicular symptoms or focal motor weakness, I tend to agree with Dr. Annette Stable that his endocarditis and multiple areas of infection and the fact that this area of concern is below 4 level fusion and might would require extension of the fusion with instrumentation, I am hesitant to offer surgical decompression at this time.  I am not sure it would offer much benefit over observation and antibiotics.  He also may have phlegmon instead of pus.  He is on gentamicin and penicillin as per infectious disease recommendations.

## 2021-12-12 NOTE — TOC Progression Note (Addendum)
Transition of Care [facility]) - Progression Note    Patient Details  Name: Kyle Lewis MRN: [id number] Date of Birth: 08/08/1945  Transition of Care [facility]) CM/SW [place], [place] Phone Number: 12/12/2021, 11:39 AM  Clinical Narrative:     Patient has SNF bed at [facility] when medically ready for dc. ID following. CSW following for end date on antibiotics. SNF updated.CSW will continue to follow and assist with patients dc planning needs.  Expected Discharge Plan: [place] Barriers to Discharge: Continued Medical Work up  Expected Discharge Plan and Services Expected Discharge Plan: [place] In-house Referral: Clinical Social Work     Living arrangements for the past 2 months: Single Family Home                                       Social Determinants of Health (SDOH) Interventions    Readmission Risk Interventions    06/10/2021   10:04 AM  Readmission Risk Prevention Plan  Transportation Screening Complete  PCP or Specialist Appt within 5-7 Days Complete  Home Care Screening Complete  Medication Review (RN CM) Complete

## 2021-12-12 NOTE — Progress Notes (Signed)
Pharmacy Antibiotic Note  Kyle Lewis is a 76 y.o. 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 SCr stable at 0.83.  Noted gemella species is highly sensitive.   Plan: Penicillin G 12 million units every 12 hours for 6 weeks minimum Gentamicin 200 mg daily for 2 weeks Would ideally like trough to be < 1 for synergy  Monitor renal function, trough levels  Will repeat trough tomorrow evening   Height: '5\' 8"'$  (172.7 cm) Weight: 99.6 kg (219 lb 9.3 oz) IBW/kg (Calculated) : 68.4  Temp (24hrs), Avg:98.1 F (36.7 C), Min:97.8 F (36.6 C), Max:98.4 F (36.9 C)  Recent Labs  Lab 12/07/21 0942 12/08/21 0238 12/08/21 1801 12/09/21 0304 12/11/21 0134 12/12/21 0141  WBC 10.6* 12.7*  --  10.3  --  10.3  CREATININE 0.73 0.84  --  0.72 0.85 0.83  GENTTROUGH  --   --  1.0  --   --   --      Estimated Creatinine Clearance: 88 mL/min (by C-G formula based on SCr of 0.83 mg/dL).    Allergies  Allergen Reactions   Cefazolin Rash and Other (See Comments)    The patient had surgery and was given cefazolin intraop. ~ 10 days later he developed a rash confirmed by biopsy to be consistent w/ drug eruption. We cannot know for sure, but this is the most likely agent.       Thank you for allowing pharmacy to be a part of this patient's care.  Jimmy Footman, PharmD, BCPS, BCIDP Infectious Diseases Clinical Pharmacist Phone: [phone] 12/12/2021 9:23 AM

## 2021-12-12 NOTE — Progress Notes (Addendum)
Progress Note  Patient Name: Kyle Lewis Date of Encounter: 12/12/2021  [facility] Cardiologist: Jenean Lindau, MD   Subjective   Generalized weakness   Inpatient Medications    Scheduled Meds:  amLODipine  10 mg Oral Daily   arformoterol  15 mcg Nebulization BID   atorvastatin  40 mg Oral Daily   budesonide (PULMICORT) nebulizer solution  0.25 mg Nebulization BID   finasteride  5 mg Oral Daily   gabapentin  300 mg Oral BID   guaiFENesin  400 mg Oral BID   heparin  5,000 Units Subcutaneous Q8H   metoprolol tartrate  25 mg Oral BID   nicotine  21 mg Transdermal Daily   polyethylene glycol  17 g Oral Daily   senna-docusate  1 tablet Oral BID   sertraline  50 mg Oral Daily   tamsulosin  0.4 mg Oral QHS   Continuous Infusions:  sodium chloride Stopped (12/09/21 0855)   gentamicin 200 mg (12/11/21 1842)   penicillin G potassium 12 Million Units in dextrose 5 % 500 mL continuous infusion 12 Million Units (12/12/21 0844)   PRN Meds: sodium chloride, acetaminophen, albuterol, hydrALAZINE, morphine injection, naLOXone (NARCAN)  injection, ondansetron **OR** ondansetron (ZOFRAN) IV, oxyCODONE-acetaminophen   Vital Signs    Vitals:   12/12/21 0739 12/12/21 0758 12/12/21 0820 12/12/21 0822  BP:  107/78 (!) 143/65   Pulse: (!) 53 60  60  Resp: 19 18 (!) 21   Temp:  98.4 F (36.9 C)  98.1 F (36.7 C)  TempSrc:  Oral  Oral  SpO2: 98% 98%    Weight:      Height:        Intake/Output Summary (Last 24 hours) at 12/12/2021 0928 Last data filed at 12/12/2021 0439 Gross per 24 hour  Intake 725.21 ml  Output 1750 ml  Net -1024.79 ml      12/12/2021    4:39 AM 12/11/2021    5:00 AM 12/10/2021    4:58 AM  Last 3 Weights  Weight (lbs) 219 lb 9.3 oz 217 lb 9.5 oz 214 lb 11.7 oz  Weight (kg) 99.6 kg 98.7 kg 97.4 kg      Telemetry    Sinus rhythm- Personally Reviewed  ECG    None new- Personally Reviewed  Physical Exam   Chronically ill  Post left shoulder  aspiration Poor dentition SEM Abdomen benign No edema/varicosities   Labs    High Sensitivity Troponin:   Recent Labs  Lab 11/30/21 1000 11/30/21 1939 11/30/21 2150  TROPONINIHS 21* 20* 21*     Chemistry Recent Labs  Lab 12/07/21 0942 12/08/21 0238 12/09/21 0304 12/11/21 0134 12/12/21 0141  NA 137   < > 135 135 135  K 4.5   < > 4.4 4.5 4.9  CL 96*   < > 95* 95* 95*  CO2 36*   < > 33* 31 31  GLUCOSE 125*   < > 115* 149* 165*  BUN 16   < > '15 20 19  '$ CREATININE 0.73   < > 0.72 0.85 0.83  CALCIUM 8.7*   < > 8.3* 8.2* 8.4*  MG 2.3  --  2.2  --   --   GFRNONAA >60   < > >60 >60 >60  ANIONGAP 5   < > '7 9 9   '$ < > = values in this interval not displayed.    Lipids No results for input(s): "CHOL", "TRIG", "HDL", "LABVLDL", "LDLCALC", "CHOLHDL" in the last 168  hours.  Hematology Recent Labs  Lab 12/08/21 0238 12/09/21 0304 12/12/21 0141  WBC 12.7* 10.3 10.3  RBC 4.07* 3.75* 3.77*  HGB 12.8* 12.0* 11.8*  HCT 38.6* 35.5* 36.0*  MCV 94.8 94.7 95.5  MCH 31.4 32.0 31.3  MCHC 33.2 33.8 32.8  RDW 14.0 14.1 14.0  PLT 211 210 212   Thyroid No results for input(s): "TSH", "FREET4" in the last 168 hours.  BNPNo results for input(s): "BNP", "PROBNP" in the last 168 hours.  DDimer No results for input(s): "DDIMER" in the last 168 hours.   Radiology    No results found.  Cardiac Studies   TEE  1. There is a highly mobile mass on the base of the left coronary cusp  highly suspicious for a vegetation. No appreciation of an abcess. Consider  cardiac CTA if need for further evaluation. The aortic valve has been  repaired/replaced. Aortic valve  regurgitation is trivial. No aortic stenosis is present. There is a 29 mm  Edwards TAVR valve present in the aortic position.   2. Left ventricular ejection fraction, by estimation, is 60 to 65%. The  left ventricle has normal function.   3. Right ventricular systolic function is normal. The right ventricular  size is normal.   4.  No left atrial/left atrial appendage thrombus was detected. The LAA  emptying velocity was 59 cm/s.   5. The mitral valve is normal in structure. Mild mitral valve  regurgitation.   6. Tricuspid leaflets appears to be mildly thickened.   7. There is mild (Grade II) layered plaque.   8. Mildly dilated pulmonary artery.   Patient Profile     76 y.o. 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  Aortic valve endocarditis: I reviewed patients TEE done 7/6 and did not appreciate any SBE he has native valve pressed into his sinus from TAVR but there is no abscess and the TAVR Sapien leaflets look normal with no central or PVL/AR His CT cannot be done today Have spoken to tech and Dr Gardiner Rhyme who is reader tomorrow and can definitively assess valve and annulus with CT in am   2.  Epidural abscess: Plan per neurosurgery Dr Ronnald Ramp   3.  Possible septic arthritis: Underwent fluoroscopic guidance shoulder aspiration.  4.  Nonobstructive coronary artery disease: No current chest pain.    Ok to feed prior to CTA  :  CTA to be done with TAVR protocol clavicles down Does not need nitro Had lopressor 25 mg this am   For questions or updates, please contact [place] Please consult [email or web address] for contact info under        Signed, Jenkins Rouge, MD  12/12/2021, 9:28 AM

## 2021-12-13 ENCOUNTER — Inpatient Hospital Stay (HOSPITAL_COMMUNITY): Payer: Medicare Other

## 2021-12-13 DIAGNOSIS — I38 Endocarditis, valve unspecified: Secondary | ICD-10-CM | POA: Diagnosis not present

## 2021-12-13 DIAGNOSIS — I340 Nonrheumatic mitral (valve) insufficiency: Secondary | ICD-10-CM | POA: Diagnosis not present

## 2021-12-13 DIAGNOSIS — J9601 Acute respiratory failure with hypoxia: Secondary | ICD-10-CM | POA: Diagnosis not present

## 2021-12-13 DIAGNOSIS — T826XXD Infection and inflammatory reaction due to cardiac valve prosthesis, subsequent encounter: Secondary | ICD-10-CM | POA: Diagnosis not present

## 2021-12-13 DIAGNOSIS — I10 Essential (primary) hypertension: Secondary | ICD-10-CM | POA: Diagnosis not present

## 2021-12-13 DIAGNOSIS — Z952 Presence of prosthetic heart valve: Secondary | ICD-10-CM | POA: Diagnosis not present

## 2021-12-13 LAB — CBC WITH DIFFERENTIAL/PLATELET
Abs Immature Granulocytes: 0.11 10*3/uL — ABNORMAL HIGH (ref 0.00–0.07)
Basophils Absolute: 0 10*3/uL (ref 0.0–0.1)
Basophils Relative: 1 %
Eosinophils Absolute: 0.1 10*3/uL (ref 0.0–0.5)
Eosinophils Relative: 1 %
HCT: 34 % — ABNORMAL LOW (ref 39.0–52.0)
Hemoglobin: 11.6 g/dL — ABNORMAL LOW (ref 13.0–17.0)
Immature Granulocytes: 1 %
Lymphocytes Relative: 18 %
Lymphs Abs: 1.5 10*3/uL (ref 0.7–4.0)
MCH: 31.7 pg (ref 26.0–34.0)
MCHC: 34.1 g/dL (ref 30.0–36.0)
MCV: 92.9 fL (ref 80.0–100.0)
Monocytes Absolute: 0.6 10*3/uL (ref 0.1–1.0)
Monocytes Relative: 8 %
Neutro Abs: 6.1 10*3/uL (ref 1.7–7.7)
Neutrophils Relative %: 71 %
Platelets: 199 10*3/uL (ref 150–400)
RBC: 3.66 MIL/uL — ABNORMAL LOW (ref 4.22–5.81)
RDW: 14 % (ref 11.5–15.5)
WBC: 8.4 10*3/uL (ref 4.0–10.5)
nRBC: 0 % (ref 0.0–0.2)

## 2021-12-13 LAB — COMPREHENSIVE METABOLIC PANEL
ALT: 34 U/L (ref 0–44)
AST: 24 U/L (ref 15–41)
Albumin: 2.2 g/dL — ABNORMAL LOW (ref 3.5–5.0)
Alkaline Phosphatase: 74 U/L (ref 38–126)
Anion gap: 6 (ref 5–15)
BUN: 17 mg/dL (ref 8–23)
CO2: 26 mmol/L (ref 22–32)
Calcium: 8 mg/dL — ABNORMAL LOW (ref 8.9–10.3)
Chloride: 100 mmol/L (ref 98–111)
Creatinine, Ser: 0.7 mg/dL (ref 0.61–1.24)
GFR, Estimated: >60 mL/min (ref >60)
Glucose, Bld: 114 mg/dL — ABNORMAL HIGH (ref 70–99)
Potassium: 4.6 mmol/L (ref 3.5–5.1)
Sodium: 132 mmol/L — ABNORMAL LOW (ref 135–145)
Total Bilirubin: 0.6 mg/dL (ref 0.3–1.2)
Total Protein: 5.5 g/dL — ABNORMAL LOW (ref 6.5–8.1)

## 2021-12-13 LAB — HEMOGLOBIN A1C
Hgb A1c MFr Bld: 5.4 % (ref 4.8–5.6)
Mean Plasma Glucose: 108.28 mg/dL

## 2021-12-13 LAB — BODY FLUID CULTURE W GRAM STAIN: Culture: NO GROWTH

## 2021-12-13 LAB — MAGNESIUM: Magnesium: 2.2 mg/dL (ref 1.7–2.4)

## 2021-12-13 LAB — GLUCOSE, CAPILLARY
Glucose-Capillary: 128 mg/dL — ABNORMAL HIGH (ref 70–99)
Glucose-Capillary: 129 mg/dL — ABNORMAL HIGH (ref 70–99)
Glucose-Capillary: 228 mg/dL — ABNORMAL HIGH (ref 70–99)
Glucose-Capillary: 137 mg/dL — ABNORMAL HIGH (ref 70–99)

## 2021-12-13 LAB — GENTAMICIN LEVEL, TROUGH: Gentamicin Trough: 0.9 ug/mL (ref 0.5–2.0)

## 2021-12-13 LAB — PHOSPHORUS: Phosphorus: 3.9 mg/dL (ref 2.5–4.6)

## 2021-12-13 MED ORDER — INSULIN ASPART 100 UNIT/ML IJ SOLN
0.0000 [IU] | Freq: Three times a day (TID) | INTRAMUSCULAR | Status: DC
  Administered 2021-12-14 – 2021-12-16 (×5): 1 [IU] via SUBCUTANEOUS

## 2021-12-13 MED ORDER — IOHEXOL 350 MG/ML SOLN
100.0000 mL | Freq: Once | INTRAVENOUS | Status: AC | PRN
  Administered 2021-12-13: 100 mL via INTRAVENOUS

## 2021-12-13 MED ORDER — NITROGLYCERIN 0.4 MG SL SUBL: 0.8000 mg | SUBLINGUAL_TABLET | Freq: Once | SUBLINGUAL | Status: DC

## 2021-12-13 NOTE — Consult Note (Signed)
Reason for Consult: Left shoulder pain Referring Physician: Hospitalist service  HPI: Kyle Lewis is an 76 y.o. male well-known to my practice having been followed for many years for chronic severe progressively increasing bilateral shoulder pain related to advanced osteoarthritis.  He has had a number of corticosteroid injections into both shoulders over the years.  His most recent to my office was August 17, 2021 where he had presented with severe left shoulder pain, with examination at that time showing profoundly restricted and painful motion.  Plain radiographs at that time confirm severe osteoarthritis with marked bony deformity and complete obliteration of the joint space.  At that time we had discussion regarding shoulder arthroplasty and had tentatively plan to proceed with surgery last month.  However during the interim his overall medical status significantly deteriorated with a significant loss of mobility with deconditioning, generalized weakness, and balance deficits.  We had concerns regarding his ability to appropriately recover from shoulder arthroplasty and he had been enrolled in physical therapy.  His more recent difficulties which ultimately led to this current admission developed in late June at which time he presented to our urgent care with severely increased low back pain.  Subsequent clinical course and ultimate admission to this hospital are thoroughly documented in the adjoining medical record.  Patient today reports that low back pain remains his primary complaint.  He does state that his left shoulder is problematic as it has been for many months.  He states that his overall level of shoulder pain is essentially unchanged from that which she was experience when I saw him in the office back in March.  He does report that his pain increased somewhat since following the aspiration as would be anticipated.  Past Medical History:  Diagnosis Date   AAA (abdominal aortic  aneurysm) (HCC)    5 cm AAA, 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 05/2015   Abnormal gait 07/31/2019   Abscess of left leg    Acute on chronic diastolic heart failure (HCC)    Acute respiratory failure with hypoxia and hypercapnia ([place]) 05/27/2021   HC03   06/14/21    =  31  - 06/23/2021   Walked 250 ft  at a slow pace with a cane. Complained of back and left knee hurting with lowest sat 89% so d/c'd 02    Adenomatous colon polyp    Adrenal mass ([place])    2 benign appearing left adrenal adenomas noted on 01/13/15 CT   Allergy    seasonal   Anxiety 04/08/2020   Arthritis    Blister of multiple sites of lower extremity 01/10/2018   Body mass index (BMI) 34.0-34.9, adult 04/07/2020   BPH with obstruction/lower urinary tract symptoms    Overview:  Probable based on symptoms   CAD (coronary artery disease) 02/01/2018   Carotid arterial disease ([place]) 05/23/2012   Carotid artery occlusion    Chronic back pain    Cigarette smoker 09/05/2018   COLONIC POLYPS, ADENOMATOUS, HX OF 03/17/2010   Qualifier: Diagnosis of  By: Harlon Ditty CMA (AAMA), Dottie     COPD GOLD II 04/07/2019   Quit smoking 03/2019 - PFT's  04/07/2019  FEV1 1.58 (57 % ) ratio 0.54  p 0 % improvement from saba p nothing prior to study with DLCO  15.22 (66%) corrects to 3.05 (74%)  for alv volume and FV curve concave classically     Degeneration of cervical intervertebral disc    Degeneration of lumbar intervertebral disc 06/14/2015   Depression  03/17/2010   Qualifier: Diagnosis of  By: Harlon Ditty CMA (AAMA), Dottie     Diverticulosis    DIVERTICULOSIS, COLON 03/17/2010   Qualifier: Diagnosis of  By: Harlon Ditty CMA (AAMA), Dottie     Dyspnea    Exogenous obesity 11/08/2013   Overview:  With a nine pound weight gain since his last visit   Heart murmur    History of back surgery    Rods and Screws in back   History of craniotomy 01/11/2018   History of left knee replacement    History of right MCA stroke 07/21/2014   Hyperlipidemia LDL goal <70  01/19/2018   Hypertension    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 Oneida Alar and 06/10/2015 last visit   Impairment of balance 10/27/2021   Inflammation of sacroiliac joint ([place]) 05/06/2019   Lower urinary tract symptoms (LUTS) 08/06/2017   Meningioma ([place])    Microscopic hematuria 11/08/2013   Mitral regurgitation 06/02/2021   Muscle weakness 10/27/2021   Neck pain 03/25/2019   Nocturia    OA (osteoarthritis) of knee 04/01/2012   OAB (overactive bladder) 01/19/2014   OSA (obstructive sleep apnea)    Bipap per Chodri since ? 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 MAngelena Form& Dr. BCyndia Bent  Severe aortic stenosis 02/01/2018   ECHO 01/30/18 - Left ventricle: The cavity size was normal. There was moderate   focal basal hypertrophy of the septum. Systolic function was   vigorous. The estimated ejection fraction was in the range of 65%   to 70%. Wall motion was normal; there were no regional wall   motion abnormalities. Doppler parameters are consistent with   abnormal left ventricular relaxation (grade 1 diastolic   dysfun   Status post AAA (abdominal aortic aneurysm) repair 02/01/2018   Stroke ([place] 2013   tia no residual deficit from   Tobacco use 11/08/2013   Ventral hernia 11/08/2013   Overview:  S/p repair with alloderm mesh and s/p SB resection    Past Surgical History:  Procedure Laterality Date   ABDOMINAL [place]IC [place]URYSM REPAIR   [place]   BACK SURGERY  2018   Rods and Screws in Back lower back   BRAIN MENINGIOMA EXCISION  1991   menigioma   CARPAL TUNNEL RELEASE     left   EYE SURGERY Bilateral    Cataract   IRRIGATION AND DEBRIDEMENT ABSCESS Left 08/25/2020   Procedure: IRRIGATION AND DEBRIDEMENT ABSCESS LEFT LEG;  Surgeon: PEvelina Bucy DPM;  [facility]  Service: Podiatry;  Laterality: Left;   JOINT REPLACEMENT Left 04-01-12   Knee   LEFT HEART CATH AND CORONARY ANGIOGRAPHY N/A 09/15/2016   Procedure: Left Heart Cath and Coronary Angiography;  Surgeon: CNelva Bush MD;  [facility];  Service: Cardiovascular;  Laterality: N/A;   MAXIMUM ACCESS (MAS)POSTERIOR LUMBAR INTERBODY FUSION (PLIF) 1 LEVEL N/A 07/07/2015   Procedure:  POSTERIOR LUMBAR INTERBODY FUSION (PLIF) Lumbar Four-Five with Pedicle Screw Fixation Lumbar Two-Five;Laminectomy Lumbar Two-Five;  Surgeon: Eustace Moore, MD;  Location: [facility];  Service: Neurosurgery;  Laterality: N/A;   POSTERIOR LUMBAR INTERBODY FUSION (PLIF) Lumbar Four-Five with Pedicle Screw Fixation Lumbar Two-Five;Laminectomy Lumbar Two-Five   RIGHT/LEFT HEART CATH AND CORONARY ANGIOGRAPHY N/A 01/24/2018   Procedure: RIGHT/LEFT HEART CATH AND CORONARY ANGIOGRAPHY;  Surgeon: Nelva Bush, MD;  Location: [facility];  Service: Cardiovascular;  Laterality: N/A;   RIGHT/LEFT HEART CATH AND CORONARY ANGIOGRAPHY N/A 05/31/2021   Procedure: RIGHT/LEFT HEART CATH AND CORONARY ANGIOGRAPHY;  Surgeon: Burnell Blanks, MD;  Location: [facility];  Service: Cardiovascular;  Laterality: N/A;   TEE WITHOUT CARDIOVERSION N/A 06/07/2021   Procedure: TRANSESOPHAGEAL ECHOCARDIOGRAM (TEE);  Surgeon: Burnell Blanks, MD;  Location: [facility];  Service: Open Heart Surgery;  Laterality: N/A;   TEE WITHOUT CARDIOVERSION N/A 12/08/2021   Procedure: TRANSESOPHAGEAL ECHOCARDIOGRAM (TEE);  Surgeon:  Berniece Salines, DO;  Location: [facility];  Service: Cardiovascular;  Laterality: N/A;   TONSILLECTOMY  as child   TOTAL KNEE ARTHROPLASTY  04/01/2012   Procedure: TOTAL KNEE ARTHROPLASTY;  Surgeon: Gearlean Alf, MD;  Location: [facility];  Service: Orthopedics;  Laterality: Left;   TRANSCATHETER AORTIC VALVE REPLACEMENT, TRANSFEMORAL Bilateral 06/07/2021   Procedure: TRANSCATHETER AORTIC VALVE REPLACEMENT, RIGHT TRANSFEMORAL;  Surgeon: Burnell Blanks, MD;  Location: [facility];  Service: Open Heart Surgery;  Laterality: Bilateral;   TRANSURETHRAL RESECTION OF PROSTATE     ULTRASOUND GUIDANCE FOR VASCULAR ACCESS Bilateral 06/07/2021   Procedure: ULTRASOUND GUIDANCE FOR VASCULAR ACCESS;  Surgeon: Burnell Blanks, MD;  Location: [facility];  Service: Open Heart Surgery;  Laterality: Bilateral;   UMBILICAL HERNIA REPAIR  2011    Family History  Problem Relation Age of Onset   Heart disease Mother        Onset ~66 y/o   Hypertension Mother        Deceased from old age at 23   Hyperlipidemia Mother    Arthritis Mother    Diabetes Father        Deceased from old age at 23   Heart attack Father    Heart disease Father        CABG at age 67   Dementia Father 52   Arthritis Father    Prostate cancer Maternal Grandfather    Colon cancer Neg Hx    Esophageal cancer Neg Hx    Rectal cancer Neg Hx    Stomach cancer Neg Hx     Social History:  reports that he quit smoking about 7 months ago. His smoking use included cigarettes. He has a 30.00 pack-year smoking history. He has never used smokeless tobacco. He reports current alcohol use. He reports that he does not use drugs.  Allergies:  Allergies  Allergen Reactions   Cefazolin Rash and Other (See Comments)    The patient had surgery and was given cefazolin intraop. ~ 10 days later he developed a rash confirmed by biopsy to be consistent w/ drug eruption. We cannot know for sure, but this is the most likely agent.      Medications: I  have reviewed the patient's current medications.  Results for orders placed or performed during the hospital encounter of 11/30/21 (from the past 48 hour(s))  Basic metabolic panel  Status: Abnormal   Collection Time: 12/12/21  1:41 AM  Result Value Ref Range   Sodium 135 135 - 145 mmol/L   Potassium 4.9 3.5 - 5.1 mmol/L   Chloride 95 (L) 98 - 111 mmol/L   CO2 31 22 - 32 mmol/L   Glucose, Bld 165 (H) 70 - 99 mg/dL    Comment: Glucose reference range applies only to samples taken after fasting for at least 8 hours.   BUN 19 8 - 23 mg/dL   Creatinine, Ser 0.83 0.61 - 1.24 mg/dL   Calcium 8.4 (L) 8.9 - 10.3 mg/dL   GFR, Estimated >60 >60 mL/min    Comment: (NOTE) Calculated using the CKD-EPI Creatinine Equation (2021)    Anion gap 9 5 - 15    Comment: Performed at [facility] [address], [place], [place] [address]  CBC with Differential/Platelet     Status: Abnormal   Collection Time: 12/12/21  1:41 AM  Result Value Ref Range   WBC 10.3 4.0 - 10.5 K/uL   RBC 3.77 (L) 4.22 - 5.81 MIL/uL   Hemoglobin 11.8 (L) 13.0 - 17.0 g/dL   HCT 36.0 (L) 39.0 - 52.0 %   MCV 95.5 80.0 - 100.0 fL   MCH 31.3 26.0 - 34.0 pg   MCHC 32.8 30.0 - 36.0 g/dL   RDW 14.0 11.5 - 15.5 %   Platelets 212 150 - 400 K/uL   nRBC 0.0 0.0 - 0.2 %   Neutrophils Relative % 78 %   Neutro Abs 8.1 (H) 1.7 - 7.7 K/uL   Lymphocytes Relative 12 %   Lymphs Abs 1.2 0.7 - 4.0 K/uL   Monocytes Relative 8 %   Monocytes Absolute 0.8 0.1 - 1.0 K/uL   Eosinophils Relative 1 %   Eosinophils Absolute 0.1 0.0 - 0.5 K/uL   Basophils Relative 0 %   Basophils Absolute 0.0 0.0 - 0.1 K/uL   Immature Granulocytes 1 %   Abs Immature Granulocytes 0.10 (H) 0.00 - 0.07 K/uL    Comment: Performed at [facility] [address], [place], [place] [address]  Glucose, capillary     Status: Abnormal   Collection Time: 12/12/21  7:55 AM  Result Value Ref Range   Glucose-Capillary 146 (H) 70 - 99 mg/dL    Comment:  Glucose reference range applies only to samples taken after fasting for at least 8 hours.  Glucose, capillary     Status: Abnormal   Collection Time: 12/12/21 11:53 AM  Result Value Ref Range   Glucose-Capillary 154 (H) 70 - 99 mg/dL    Comment: Glucose reference range applies only to samples taken after fasting for at least 8 hours.  Glucose, capillary     Status: Abnormal   Collection Time: 12/12/21  3:48 PM  Result Value Ref Range   Glucose-Capillary 114 (H) 70 - 99 mg/dL    Comment: Glucose reference range applies only to samples taken after fasting for at least 8 hours.  Glucose, capillary     Status: Abnormal   Collection Time: 12/12/21  8:57 PM  Result Value Ref Range   Glucose-Capillary 207 (H) 70 - 99 mg/dL    Comment: Glucose reference range applies only to samples taken after fasting for at least 8 hours.  CBC with Differential/Platelet     Status: Abnormal   Collection Time: 12/13/21  4:24 AM  Result Value Ref Range   WBC 8.4 4.0 - 10.5 K/uL   RBC 3.66 (  L) 4.22 - 5.81 MIL/uL   Hemoglobin 11.6 (L) 13.0 - 17.0 g/dL   HCT 34.0 (L) 39.0 - 52.0 %   MCV 92.9 80.0 - 100.0 fL   MCH 31.7 26.0 - 34.0 pg   MCHC 34.1 30.0 - 36.0 g/dL   RDW 14.0 11.5 - 15.5 %   Platelets 199 150 - 400 K/uL   nRBC 0.0 0.0 - 0.2 %   Neutrophils Relative % 71 %   Neutro Abs 6.1 1.7 - 7.7 K/uL   Lymphocytes Relative 18 %   Lymphs Abs 1.5 0.7 - 4.0 K/uL   Monocytes Relative 8 %   Monocytes Absolute 0.6 0.1 - 1.0 K/uL   Eosinophils Relative 1 %   Eosinophils Absolute 0.1 0.0 - 0.5 K/uL   Basophils Relative 1 %   Basophils Absolute 0.0 0.0 - 0.1 K/uL   Immature Granulocytes 1 %   Abs Immature Granulocytes 0.11 (H) 0.00 - 0.07 K/uL    Comment: Performed at [facility] [address] [place], [place] [address]  Comprehensive metabolic panel     Status: Abnormal   Collection Time: 12/13/21  4:24 AM  Result Value Ref Range   Sodium 132 (L) 135 - 145 mmol/L   Potassium 4.6 3.5 - 5.1 mmol/L    Chloride 100 98 - 111 mmol/L   CO2 26 22 - 32 mmol/L   Glucose, Bld 114 (H) 70 - 99 mg/dL    Comment: Glucose reference range applies only to samples taken after fasting for at least 8 hours.   BUN 17 8 - 23 mg/dL   Creatinine, Ser 0.70 0.61 - 1.24 mg/dL   Calcium 8.0 (L) 8.9 - 10.3 mg/dL   Total Protein 5.5 (L) 6.5 - 8.1 g/dL   Albumin 2.2 (L) 3.5 - 5.0 g/dL   AST 24 15 - 41 U/L   ALT 34 0 - 44 U/L   Alkaline Phosphatase 74 38 - 126 U/L   Total Bilirubin 0.6 0.3 - 1.2 mg/dL   GFR, Estimated >60 >60 mL/min    Comment: (NOTE) Calculated using the CKD-EPI Creatinine Equation (2021)    Anion gap 6 5 - 15    Comment: Performed at [facility], [place] [address] [place], [place] [address]  Magnesium     Status: None   Collection Time: 12/13/21  4:24 AM  Result Value Ref Range   Magnesium 2.2 1.7 - 2.4 mg/dL    Comment: Performed at [facility] [address] [place], [place] [address]  Phosphorus     Status: None   Collection Time: 12/13/21  4:24 AM  Result Value Ref Range   Phosphorus 3.9 2.5 - 4.6 mg/dL    Comment: Performed at [facility] [address], [place], [place] [address]  Glucose, capillary     Status: Abnormal   Collection Time: 12/13/21  7:43 AM  Result Value Ref Range   Glucose-Capillary 128 (H) 70 - 99 mg/dL    Comment: Glucose reference range applies only to samples taken after fasting for at least 8 hours.  Glucose, capillary     Status: Abnormal   Collection Time: 12/13/21 12:01 PM  Result Value Ref Range   Glucose-Capillary 129 (H) 70 - 99 mg/dL    Comment: Glucose reference range applies only to samples taken after fasting for at least 8 hours.    CT CORONARY MORPH W/CTA COR W/SCORE W/CA W/CM &/OR WO/CM  Addendum Date: 12/13/2021  ADDENDUM REPORT: 12/13/2021 10:19 CLINICAL DATA:  72M with AS s/p TAVR and echo concerning for endocarditis. Evaluate for aortic abscess EXAM: Cardiac/Coronary CTA TECHNIQUE: The patient was scanned on a  [employer]. FINDINGS: A 100 kV retrospective scan was triggered in the descending thoracic aorta at 111 HU's. Axial non-contrast 3 mm slices were carried out through the heart. The data set was analyzed on a dedicated work station and scored using the [place]. Gantry rotation speed was 250 msecs and collimation was .6 mm. The 3D data set was reconstructed in 5% intervals of the R-R cycle. Phases were analyzed on a dedicated work station using MPR, MIP and VRT modes. The patient received 100 cc of contrast. Aortic Valve: S/p TAVR with 72m Sapien 3 valve. No aortic abscess seen. Coronary Arteries: Normal coronary origin. Right dominance. Study was without use of NTG ans is not sufficient to evaluate coronary artery stenosis. Calcium score 1489 (85th percentile) Left Ventricle: Mild dilatation Left Atrium: Moderate dilatation Pulmonary Veins: Normal configuration Right Ventricle: Mild dilatation Right Atrium: Mild dilatation Thoracic aorta: Normal size Pulmonary Arteries: Dilated main PA measuring 379mSystemic Veins: Normal drainage Pericardium: Normal thickness IMPRESSION: 1. No aortic abscess seen 2. S/p TAVR with 2937mapien 3 valve 3. Coronary calcium score 1489 (85th percentile) Electronically Signed   By: ChrOswaldo MilianD.   On: 12/13/2021 10:19   Result Date: 12/13/2021 EXAM: OVER-READ INTERPRETATION  CT CHEST The following report is a limited chest CT over-read performed by radiologist Dr. Le[facility]Yetta Glassman [facility] [place] 12/13/2021. This over-read does not include interpretation of cardiac or coronary anatomy or pathology. The cardiac CTA interpretation by the cardiologist is attached. COMPARISON:  Heart CT dated June 01, 2021 FINDINGS: Vascular: Normal caliber thoracic aorta with severe calcified and noncalcified plaque. Prior transcatheter aortic valve replacement. Normal heart size. No pericardial effusion. No suspicious filling defects of the main pulmonary  arteries. Mediastinum/Nodes: Esophagus is unremarkable. No pathologically enlarged lymph nodes seen in the chest. Lungs/Pleura: Central airways are patent. Moderate centrilobular emphysema. Small solid pulmonary nodule of the right middle lobe measuring 4 mm on series 10, image 76, new when compared with June 01, 2021 prior. Trace left pleural effusion and bibasilar atelectasis. Upper Abdomen: No acute abnormality. Musculoskeletal: No chest wall mass or suspicious bone lesions identified. IMPRESSION: 1. No acute extracardiac abnormality. 2. Small solid pulmonary nodule of the right middle lobe measuring 4 mm, which is new when compared with June 01, 2021 prior. Recommend follow-up chest CT in 6 months. Electronically Signed: By: LeaYetta GlassmanD. On: 12/13/2021 08:43     Vitals Temp:  [97.7 F (36.5 C)-99.8 F (37.7 C)] 97.7 F (36.5 C) (07/11 1405) Pulse Rate:  [52-69] 58 (07/11 1405) Resp:  [12-20] 20 (07/11 1405) BP: (127-156)/(62-78) 127/77 (07/11 1405) SpO2:  [94 %-100 %] 94 % (07/11 1405) FiO2 (%):  [21 %] 21 % (07/10 2321) Weight:  [99.8 kg] 99.8 kg (07/11 0355) Body mass index is 33.45 kg/m.  Physical Exam: Patient resting in bed this afternoon, alert and oriented.  Complains primarily of low back pain.  Inspection of the left shoulder demonstrates no evidence for localized swelling or fluctuance.  There is a region of resolving ecchymosis surrounding his recent arthrocentesis site.  There is also a region of erythema extending across the anterior chest and anterior aspect of the shoulder which appears chronic.  I do not appreciate increased temperature and he has very minimal tenderness palpation.  With passive  motion of the shoulder there is severe crepitance related to his underlying severe osteoarthritis.  He remains otherwise grossly neurovascular intact in the left upper extremity.  Radiographs  Recent plain films were reviewed which again shows severe joint destruction  with underlying advanced osteoarthritis with complete obliteration of the joint space and bony deformity.  These changes are identical to those noted on the x-rays at his most recent office visit back in March of this year  Laboratory studies  Recent left shoulder aspiration yielded a white count of 27,000.  This is consistent with a degenerative fusion.  Cultures are no growth so far     Assessment/Plan: Impression:  1.  Multiple profound medical comorbidities with significant generalized health debilitation, with the extensive list of comorbidities well-documented in the adjoining medical record  2.  Recent admission for severe low back pain with prior history of lumbar fusion and concerns for possible epidural abscess  3.  Severe bilateral shoulder osteoarthritis, left currently most symptomatic.  Clinical exam and recent aspiration suggest that this is a flare of his underlying degenerative arthritis.  I do not identify any clinical or laboratory findings to suggest that this represents a septic arthritis.   Recommendations:  I have counseled Kyle Lewis and his wife regarding today's findings.  From a symptomatic standpoint I would recommend a left shoulder corticosteroid injection under radiographic/ultrasound guidance performed by special procedures to gain symptomatic relief.  Position left arm for comfort and use upper extremities within pain tolerance.  Call with any questions  Kassie Keng M Gasper Hopes 12/13/2021, 4:29 PM  Contact # [phone]

## 2021-12-13 NOTE — Progress Notes (Signed)
Progress Note  Patient Name: Kyle Lewis Date of Encounter: 12/13/2021  [facility] Cardiologist: Jenean Lindau, MD   Subjective   Doing well watching news   Inpatient Medications    Scheduled Meds:  amLODipine  10 mg Oral Daily   arformoterol  15 mcg Nebulization BID   atorvastatin  40 mg Oral Daily   budesonide (PULMICORT) nebulizer solution  0.25 mg Nebulization BID   finasteride  5 mg Oral Daily   gabapentin  300 mg Oral BID   guaiFENesin  400 mg Oral BID   heparin  5,000 Units Subcutaneous Q8H   metoprolol tartrate  25 mg Oral BID   nicotine  21 mg Transdermal Daily   polyethylene glycol  17 g Oral Daily   senna-docusate  1 tablet Oral BID   sertraline  50 mg Oral Daily   tamsulosin  0.4 mg Oral QHS   Continuous Infusions:  sodium chloride 10 mL/hr at 12/12/21 1929   gentamicin Stopped (12/12/21 1814)   penicillin G potassium 12 Million Units in dextrose 5 % 500 mL continuous infusion 12 Million Units (12/13/21 1018)   PRN Meds: sodium chloride, acetaminophen, albuterol, hydrALAZINE, morphine injection, naLOXone (NARCAN)  injection, ondansetron **OR** ondansetron (ZOFRAN) IV, oxyCODONE-acetaminophen   Vital Signs    Vitals:   12/12/21 2321 12/13/21 0355 12/13/21 0845 12/13/21 1015  BP: (!) 151/72 128/71  (!) 156/78  Pulse: 65 (!) 52 (!) 58 60  Resp: '13 17 12 16  '$ Temp:  99.8 F (37.7 C)  98 F (36.7 C)  TempSrc:  Oral  Oral  SpO2: 95% 100%  94%  Weight:  99.8 kg    Height:        Intake/Output Summary (Last 24 hours) at 12/13/2021 1105 Last data filed at 12/13/2021 1018 Gross per 24 hour  Intake 2124.32 ml  Output 3050 ml  Net -925.68 ml      12/13/2021    3:55 AM 12/12/2021    4:39 AM 12/11/2021    5:00 AM  Last 3 Weights  Weight (lbs) 220 lb 0.3 oz 219 lb 9.3 oz 217 lb 9.5 oz  Weight (kg) 99.8 kg 99.6 kg 98.7 kg      Telemetry    Sinus rhythm- Personally Reviewed  ECG    None new- Personally Reviewed  Physical Exam    Chronically ill  Post left shoulder aspiration Poor dentition SEM Abdomen benign No edema/varicosities   Labs    High Sensitivity Troponin:   Recent Labs  Lab 11/30/21 1000 11/30/21 1939 11/30/21 2150  TROPONINIHS 21* 20* 21*     Chemistry Recent Labs  Lab 12/07/21 0942 12/08/21 0238 12/09/21 0304 12/11/21 0134 12/12/21 0141 12/13/21 0424  NA 137   < > 135 135 135 132*  K 4.5   < > 4.4 4.5 4.9 4.6  CL 96*   < > 95* 95* 95* 100  CO2 36*   < > 33* '31 31 26  '$ GLUCOSE 125*   < > 115* 149* 165* 114*  BUN 16   < > '15 20 19 17  '$ CREATININE 0.73   < > 0.72 0.85 0.83 0.70  CALCIUM 8.7*   < > 8.3* 8.2* 8.4* 8.0*  MG 2.3  --  2.2  --   --  2.2  PROT  --   --   --   --   --  5.5*  ALBUMIN  --   --   --   --   --  2.2*  AST  --   --   --   --   --  24  ALT  --   --   --   --   --  34  ALKPHOS  --   --   --   --   --  74  BILITOT  --   --   --   --   --  0.6  GFRNONAA >60   < > >60 >60 >60 >60  ANIONGAP 5   < > '7 9 9 6   '$ < > = values in this interval not displayed.    Lipids No results for input(s): "CHOL", "TRIG", "HDL", "LABVLDL", "LDLCALC", "CHOLHDL" in the last 168 hours.  Hematology Recent Labs  Lab 12/09/21 0304 12/12/21 0141 12/13/21 0424  WBC 10.3 10.3 8.4  RBC 3.75* 3.77* 3.66*  HGB 12.0* 11.8* 11.6*  HCT 35.5* 36.0* 34.0*  MCV 94.7 95.5 92.9  MCH 32.0 31.3 31.7  MCHC 33.8 32.8 34.1  RDW 14.1 14.0 14.0  PLT 210 212 199   Thyroid No results for input(s): "TSH", "FREET4" in the last 168 hours.  BNPNo results for input(s): "BNP", "PROBNP" in the last 168 hours.  DDimer No results for input(s): "DDIMER" in the last 168 hours.   Radiology    CT CORONARY MORPH W/CTA COR W/SCORE W/CA W/CM &/OR WO/CM  Addendum Date: 12/13/2021   ADDENDUM REPORT: 12/13/2021 10:19 CLINICAL DATA:  76M with AS s/p TAVR and echo concerning for endocarditis. Evaluate for aortic abscess EXAM: Cardiac/Coronary CTA TECHNIQUE: The patient was scanned on a [employer].  FINDINGS: A 100 kV retrospective scan was triggered in the descending thoracic aorta at 111 HU's. Axial non-contrast 3 mm slices were carried out through the heart. The data set was analyzed on a dedicated work station and scored using the [place]. Gantry rotation speed was 250 msecs and collimation was .6 mm. The 3D data set was reconstructed in 5% intervals of the R-R cycle. Phases were analyzed on a dedicated work station using MPR, MIP and VRT modes. The patient received 100 cc of contrast. Aortic Valve: S/p TAVR with 57m Sapien 3 valve. No aortic abscess seen. Coronary Arteries: Normal coronary origin. Right dominance. Study was without use of NTG ans is not sufficient to evaluate coronary artery stenosis. Calcium score 1489 (85th percentile) Left Ventricle: Mild dilatation Left Atrium: Moderate dilatation Pulmonary Veins: Normal configuration Right Ventricle: Mild dilatation Right Atrium: Mild dilatation Thoracic aorta: Normal size Pulmonary Arteries: Dilated main PA measuring 328mSystemic Veins: Normal drainage Pericardium: Normal thickness IMPRESSION: 1. No aortic abscess seen 2. S/p TAVR with 2951mapien 3 valve 3. Coronary calcium score 1489 (85th percentile) Electronically Signed   By: ChrOswaldo MilianD.   On: 12/13/2021 10:19   Result Date: 12/13/2021 EXAM: OVER-READ INTERPRETATION  CT CHEST The following report is a limited chest CT over-read performed by radiologist Dr. LeaYetta Glassman [facility] [place] 12/13/2021. This over-read does not include interpretation of cardiac or coronary anatomy or pathology. The cardiac CTA interpretation by the cardiologist is attached. COMPARISON:  Heart CT dated June 01, 2021 FINDINGS: Vascular: Normal caliber thoracic aorta with severe calcified and noncalcified plaque. Prior transcatheter aortic valve replacement. Normal heart size. No pericardial effusion. No suspicious filling defects of the main pulmonary arteries.  Mediastinum/Nodes: Esophagus is unremarkable. No pathologically enlarged lymph nodes seen in the chest. Lungs/Pleura: Central airways are patent. Moderate centrilobular emphysema. Small solid pulmonary nodule of the  right middle lobe measuring 4 mm on series 10, image 76, new when compared with June 01, 2021 prior. Trace left pleural effusion and bibasilar atelectasis. Upper Abdomen: No acute abnormality. Musculoskeletal: No chest wall mass or suspicious bone lesions identified. IMPRESSION: 1. No acute extracardiac abnormality. 2. Small solid pulmonary nodule of the right middle lobe measuring 4 mm, which is new when compared with June 01, 2021 prior. Recommend follow-up chest CT in 6 months. Electronically Signed: By: Yetta Glassman M.D. On: 12/13/2021 08:43    Cardiac Studies   TEE  1. There is a highly mobile mass on the base of the left coronary cusp  highly suspicious for a vegetation. No appreciation of an abcess. Consider  cardiac CTA if need for further evaluation. The aortic valve has been  repaired/replaced. Aortic valve  regurgitation is trivial. No aortic stenosis is present. There is a 29 mm  Edwards TAVR valve present in the aortic position.   2. Left ventricular ejection fraction, by estimation, is 60 to 65%. The  left ventricle has normal function.   3. Right ventricular systolic function is normal. The right ventricular  size is normal.   4. No left atrial/left atrial appendage thrombus was detected. The LAA  emptying velocity was 59 cm/s.   5. The mitral valve is normal in structure. Mild mitral valve  regurgitation.   6. Tricuspid leaflets appears to be mildly thickened.   7. There is mild (Grade II) layered plaque.   8. Mildly dilated pulmonary artery.   Patient Profile     76 y.o. 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  Aortic valve endocarditis: ?  I reviewed patients TEE done 7/6 and did not appreciate any SBE he has native valve pressed into his sinus from TAVR but there is no abscess and the TAVR Sapien leaflets look normal with no central or PVL/AR I also reviewed his cardiac CT scan done today with Dr Gardiner Rhyme and there is no vegetation/abscess. The TAVR leaflets look normal with no HALT/HAM ( hypo attenuation with leaflet thickening or restricted motion )  Duration of antibiotics should be determined based on epidural abscess and septic arthritis  Patient should have dental consult to further reduce risk of infections   2.  Epidural abscess: Plan per neurosurgery Dr Ronnald Ramp   3.  Possible septic arthritis: Underwent fluoroscopic guidance shoulder aspiration.  4.  Nonobstructive coronary artery disease: No current chest pain.      For questions or updates, please contact [place] Please consult [email or web address] for contact info under        Signed, Jenkins Rouge, MD  12/13/2021, 11:05 AM

## 2021-12-13 NOTE — Progress Notes (Signed)
OT Cancellation Note  Patient Details Name: Kyle Lewis MRN: [id number] DOB: June 12, 1945   Cancelled Treatment:    Reason Eval/Treat Not Completed: Patient at procedure or test/ unavailable (patient out for x-rays, will attempt later today if schedule permits.) Lodema Hong, [place]  Office [place] 12/13/2021, 1:47 PM

## 2021-12-13 NOTE — Progress Notes (Signed)
[facility]                                                                               Kyle Lewis, is a 76 y.o. male, DOB - 1946-06-05, ZYS:[id number] Admit date - 11/30/2021    Outpatient Primary MD for the patient is [name], MD  LOS - 13  days    Brief summary   Kyle Lewis is a 75 y.o. male with medical history significant of obesity, essential hypertension, COPD, OSA on bipap, coronary artery disease, abdominal aortic aneurysm repair, TAVR, hyperlipidemia, ongoing tobacco use, chronic back pain, who presented to the ED from home via EMS with complaints of increase back pain from chronic baseline x 1 week duration, despite use of home ultram and prednisone from 11/25/21.  In addition to back pain, endorsed cough/chills.  +Wheezing as well as hypoxemia to 83% in triage.     Work-up revealed community-acquired pneumonia, COPD exacerbation, with associated acute hypoxic respiratory failure.     Hospital course complicated by Gemella Sanguinis bacteremia with source suspected to be odontogenic from poor dentition.  His wife also reports that he never cleans his BiPAP at home.  CT maxillofacial showed no dental abscess.   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  The patient is currently on penicillin and gentamicin for synergy as recommended by infectious disease.  Pain control and bowel regimen in place.   Assessment & Plan    Assessment and Plan:   Acute on Chronic hypoxia and hypercarbic respiratory failure likely due to CAP and COPD exacerbation:   - completed the course of steroids.  - [place] oxygen to keep sats greater than 90%.  - continue with bronchodilators ,  mobilize and continue with Flutter valve.     Bacteremia:   Source suspected to be odontogenic from poor dentition, wife also reports he never cleans his bipap at home, CT maxillofacial showed no dental abscess The patient is currently on penicillin and gentamicin for synergy as recommended by infectious disease.  Pain control and bowel regimen in place. Appreciate infectious disease assistance with the management  TAVR endocarditis:  Continue with penicillin and gentamicin for synergy.     Left shoulder septic arthritis vs degenerative changes:  - s/p drain, gram stain , joint fluid drained.    Epidural abscess:  No surgical intervention needed.  Continue with IV antibiotics as per ID.  Pain control .     AAA repair.   CTA noted no acute dissection or acute aortic process.  A tiny endoleak in the anterior endosac would be difficult to exclude, smaller than on previous imaging, potentially representing calcification, recommended follow-up -cardiology officially consulted    Essential hypertension:  BP optimal.     Hyperlipidemia:  Continue with statin.     OSA;  Continue with bipap at night.    Body mass index is 33.45 kg/m. Obesity.    Left renal cyst.  Outpatient follo wup .  Estimated body mass index is 33.45 kg/m as calculated from the following:   Height as of this encounter: '5\' 8"'$  (1.727 m).   Weight as of this encounter: 99.8 kg.  Code Status: full code.  DVT Prophylaxis:  heparin injection 5,000 Units Start: 11/30/21 1630   Level of Care: Level of care: Progressive Family Communication: none at bedside.   Disposition Plan:     Remains inpatient appropriate:  IV antibiotics.   Procedures:  None.   Consultants:   Cardiology    Antimicrobials:   Anti-infectives (From admission, onward)    Start     Dose/Rate Route Frequency Ordered Stop   12/09/21 1800  gentamicin (GARAMYCIN) 200 mg in dextrose 5 % 50 mL IVPB        2.5  mg/kg  80.6 kg (Adjusted) 110 mL/hr over 30 Minutes Intravenous Every 24 hours 12/09/21 0853     12/05/21 1800  gentamicin (GARAMYCIN) 240 mg in dextrose 5 % 50 mL IVPB  Status:  Discontinued        3 mg/kg  80.6 kg (Adjusted) 112 mL/hr over 30 Minutes Intravenous Every 24 hours 12/05/21 1317 12/09/21 0853   12/05/21 1400  penicillin G potassium 12 Million Units in dextrose 5 % 500 mL continuous infusion        12 Million Units 41.7 mL/hr over 12 Hours Intravenous Every 12 hours 12/05/21 1305     12/02/21 1800  vancomycin (VANCOREADY) IVPB 1250 mg/250 mL  Status:  Discontinued        1,250 mg 166.7 mL/hr over 90 Minutes Intravenous Every 24 hours 12/01/21 1610 12/05/21 1305   12/01/21 1800  azithromycin (ZITHROMAX) tablet 500 mg        500 mg Oral Every evening 12/01/21 0832 12/04/21 1714   12/01/21 1700  vancomycin (VANCOREADY) IVPB 1750 mg/350 mL        1,750 mg 175 mL/hr over 120 Minutes Intravenous  Once 12/01/21 1610 12/01/21 2045   11/30/21 1630  Ampicillin-Sulbactam (UNASYN) 3 g in sodium chloride 0.9 % 100 mL IVPB  Status:  Discontinued        3 g 200 mL/hr over 30 Minutes Intravenous Every 6 hours 11/30/21 1621 12/05/21 1305   11/30/21 1630  azithromycin (ZITHROMAX) 500 mg in sodium chloride 0.9 % 250 mL IVPB  Status:  Discontinued        500 mg 250 mL/hr over 60 Minutes Intravenous Every 24 hours 11/30/21 1622 12/01/21 0832   11/30/21 0930  Ampicillin-Sulbactam (UNASYN) 3 g in sodium chloride 0.9 % 100 mL IVPB        3 g 200 mL/hr over 30 Minutes Intravenous  Once 11/30/21 0923 11/30/21 1120        Medications  Scheduled Meds:  amLODipine  10 mg Oral Daily   arformoterol  15 mcg Nebulization BID   atorvastatin  40 mg Oral Daily   budesonide (PULMICORT) nebulizer solution  0.25 mg Nebulization BID   finasteride  5 mg Oral Daily   gabapentin  300 mg Oral BID   guaiFENesin  400 mg Oral BID   heparin  5,000 Units Subcutaneous Q8H   metoprolol tartrate  25 mg Oral BID    nicotine  21 mg Transdermal Daily   polyethylene glycol  17 g Oral Daily   senna-docusate  1 tablet Oral BID   sertraline  50 mg Oral Daily   tamsulosin  0.4 mg Oral QHS   Continuous Infusions:  sodium chloride 10 mL/hr at 12/12/21 1929  gentamicin Stopped (12/12/21 1814)   penicillin G potassium 12 Million Units in dextrose 5 % 500 mL continuous infusion 12 Million Units (12/13/21 1018)   PRN Meds:.sodium chloride, acetaminophen, albuterol, hydrALAZINE, morphine injection, naLOXone (NARCAN)  injection, ondansetron **OR** ondansetron (ZOFRAN) IV, oxyCODONE-acetaminophen    Subjective:   Kyle Lewis was seen and examined today.  No chest pain or sob. No tooth pain.   Objective:   Vitals:   12/12/21 2321 12/13/21 0355 12/13/21 0845 12/13/21 1015  BP: (!) 151/72 128/71  (!) 156/78  Pulse: 65 (!) 52 (!) 58 60  Resp: '13 17 12 16  '$ Temp:  99.8 F (37.7 C)  98 F (36.7 C)  TempSrc:  Oral  Oral  SpO2: 95% 100%  94%  Weight:  99.8 kg    Height:        Intake/Output Summary (Last 24 hours) at 12/13/2021 1239 Last data filed at 12/13/2021 1018 Gross per 24 hour  Intake 2124.32 ml  Output 3050 ml  Net -925.68 ml   Filed Weights   12/11/21 0500 12/12/21 0439 12/13/21 0355  Weight: 98.7 kg 99.6 kg 99.8 kg     Exam General exam: Appears calm and comfortable  Respiratory system: Clear to auscultation. Respiratory effort normal. Cardiovascular system: S1 & S2 heard, RRR. No JVD, murmurs, rubs, gallops or clicks. No pedal edema. Gastrointestinal system: Abdomen is nondistended, soft and nontender. No organomegaly or masses felt. Normal bowel sounds heard. Central nervous system: Alert and oriented. No focal neurological deficits. Extremities: Symmetric 5 x 5 power. Skin: No rashes, lesions or ulcers Psychiatry: Judgement and insight appear normal. Mood & affect appropriate.     Data Reviewed:  I have personally reviewed following labs and imaging studies   CBC Lab  Results  Component Value Date   WBC 8.4 12/13/2021   RBC 3.66 (L) 12/13/2021   HGB 11.6 (L) 12/13/2021   HCT 34.0 (L) 12/13/2021   MCV 92.9 12/13/2021   MCH 31.7 12/13/2021   PLT 199 12/13/2021   MCHC 34.1 12/13/2021   RDW 14.0 12/13/2021   LYMPHSABS 1.5 12/13/2021   MONOABS 0.6 12/13/2021   EOSABS 0.1 12/13/2021   BASOSABS 0.0 78/29/5621     Last metabolic panel Lab Results  Component Value Date   NA 132 (L) 12/13/2021   K 4.6 12/13/2021   CL 100 12/13/2021   CO2 26 12/13/2021   BUN 17 12/13/2021   CREATININE 0.70 12/13/2021   GLUCOSE 114 (H) 12/13/2021   GFRNONAA >60 12/13/2021   GFRAA 107 04/08/2020   CALCIUM 8.0 (L) 12/13/2021   PHOS 3.9 12/13/2021   PROT 5.5 (L) 12/13/2021   ALBUMIN 2.2 (L) 12/13/2021   LABGLOB 2.7 09/25/2019   AGRATIO 1.4 09/25/2019   BILITOT 0.6 12/13/2021   ALKPHOS 74 12/13/2021   AST 24 12/13/2021   ALT 34 12/13/2021   ANIONGAP 6 12/13/2021    CBG (last 3)  Recent Labs    12/12/21 2057 12/13/21 0743 12/13/21 1201  GLUCAP 207* 128* 129*      Coagulation Profile: Recent Labs  Lab 12/07/21 0942  INR 1.0     Radiology Studies: CT CORONARY MORPH W/CTA COR W/SCORE W/CA W/CM &/OR WO/CM  Addendum Date: 12/13/2021   ADDENDUM REPORT: 12/13/2021 10:19 CLINICAL DATA:  66M with AS s/p TAVR and echo concerning for endocarditis. Evaluate for aortic abscess EXAM: Cardiac/Coronary CTA TECHNIQUE: The patient was scanned on a [employer]. FINDINGS: A 100 kV retrospective scan was triggered in the descending thoracic  aorta at 111 HU's. Axial non-contrast 3 mm slices were carried out through the heart. The data set was analyzed on a dedicated work station and scored using the [place]. Gantry rotation speed was 250 msecs and collimation was .6 mm. The 3D data set was reconstructed in 5% intervals of the R-R cycle. Phases were analyzed on a dedicated work station using MPR, MIP and VRT modes. The patient received 100 cc of contrast.  Aortic Valve: S/p TAVR with 51m Sapien 3 valve. No aortic abscess seen. Coronary Arteries: Normal coronary origin. Right dominance. Study was without use of NTG ans is not sufficient to evaluate coronary artery stenosis. Calcium score 1489 (85th percentile) Left Ventricle: Mild dilatation Left Atrium: Moderate dilatation Pulmonary Veins: Normal configuration Right Ventricle: Mild dilatation Right Atrium: Mild dilatation Thoracic aorta: Normal size Pulmonary Arteries: Dilated main PA measuring 324mSystemic Veins: Normal drainage Pericardium: Normal thickness IMPRESSION: 1. No aortic abscess seen 2. S/p TAVR with 2948mapien 3 valve 3. Coronary calcium score 1489 (85th percentile) Electronically Signed   By: ChrOswaldo MilianD.   On: 12/13/2021 10:19   Result Date: 12/13/2021 EXAM: OVER-READ INTERPRETATION  CT CHEST The following report is a limited chest CT over-read performed by radiologist Dr. LeaYetta Glassman [facility] [place] 12/13/2021. This over-read does not include interpretation of cardiac or coronary anatomy or pathology. The cardiac CTA interpretation by the cardiologist is attached. COMPARISON:  Heart CT dated June 01, 2021 FINDINGS: Vascular: Normal caliber thoracic aorta with severe calcified and noncalcified plaque. Prior transcatheter aortic valve replacement. Normal heart size. No pericardial effusion. No suspicious filling defects of the main pulmonary arteries. Mediastinum/Nodes: Esophagus is unremarkable. No pathologically enlarged lymph nodes seen in the chest. Lungs/Pleura: Central airways are patent. Moderate centrilobular emphysema. Small solid pulmonary nodule of the right middle lobe measuring 4 mm on series 10, image 76, new when compared with June 01, 2021 prior. Trace left pleural effusion and bibasilar atelectasis. Upper Abdomen: No acute abnormality. Musculoskeletal: No chest wall mass or suspicious bone lesions identified. IMPRESSION: 1. No acute  extracardiac abnormality. 2. Small solid pulmonary nodule of the right middle lobe measuring 4 mm, which is new when compared with June 01, 2021 prior. Recommend follow-up chest CT in 6 months. Electronically Signed: By: LeaYetta GlassmanD. On: 12/13/2021 08:43       VijHosie PoissonD. [facility] 12/13/2021, 12:39 PM  Available via Epic secure chat 7am-7pm After 7 pm, please refer to night coverage provider listed on amion.

## 2021-12-13 NOTE — Progress Notes (Signed)
MD notified of patient's blood sugar 228.

## 2021-12-13 NOTE — TOC Progression Note (Signed)
Transition of Care [facility]) - Progression Note    Patient Details  Name: Kyle Lewis MRN: [id number] Date of Birth: 08/25/1945  [facility] CM/SW [place], [place] Phone Number: 12/13/2021, 3:31 PM  Clinical Narrative:     Patient has SNF bed at [facility] when medically ready for dc. ID following. CSW following for end date on antibiotics. SNF updated.CSW will continue to follow and assist with patients dc planning needs.  Expected Discharge Plan: [place] Barriers to Discharge: Continued Medical Work up  Expected Discharge Plan and Services Expected Discharge Plan: [place] In-house Referral: Clinical Social Work     Living arrangements for the past 2 months: Single Family Home                                       Social Determinants of Health (SDOH) Interventions    Readmission Risk Interventions    06/10/2021   10:04 AM  Readmission Risk Prevention Plan  Transportation Screening Complete  PCP or Specialist Appt within 5-7 Days Complete  Home Care Screening Complete  Medication Review (RN CM) Complete

## 2021-12-14 ENCOUNTER — Inpatient Hospital Stay (HOSPITAL_COMMUNITY): Payer: Medicare Other

## 2021-12-14 DIAGNOSIS — R7881 Bacteremia: Secondary | ICD-10-CM | POA: Diagnosis not present

## 2021-12-14 DIAGNOSIS — I10 Essential (primary) hypertension: Secondary | ICD-10-CM | POA: Diagnosis not present

## 2021-12-14 DIAGNOSIS — T826XXD Infection and inflammatory reaction due to cardiac valve prosthesis, subsequent encounter: Secondary | ICD-10-CM | POA: Diagnosis not present

## 2021-12-14 DIAGNOSIS — J9601 Acute respiratory failure with hypoxia: Secondary | ICD-10-CM | POA: Diagnosis not present

## 2021-12-14 DIAGNOSIS — I38 Endocarditis, valve unspecified: Secondary | ICD-10-CM | POA: Diagnosis not present

## 2021-12-14 DIAGNOSIS — M00812 Arthritis due to other bacteria, left shoulder: Secondary | ICD-10-CM | POA: Diagnosis not present

## 2021-12-14 DIAGNOSIS — Z952 Presence of prosthetic heart valve: Secondary | ICD-10-CM | POA: Diagnosis not present

## 2021-12-14 LAB — CBC WITH DIFFERENTIAL/PLATELET
Abs Immature Granulocytes: 0.08 10*3/uL — ABNORMAL HIGH (ref 0.00–0.07)
Basophils Absolute: 0.1 10*3/uL (ref 0.0–0.1)
Basophils Relative: 1 %
Eosinophils Absolute: 0.1 10*3/uL (ref 0.0–0.5)
Eosinophils Relative: 2 %
HCT: 34.6 % — ABNORMAL LOW (ref 39.0–52.0)
Hemoglobin: 11.7 g/dL — ABNORMAL LOW (ref 13.0–17.0)
Immature Granulocytes: 1 %
Lymphocytes Relative: 16 %
Lymphs Abs: 1.2 10*3/uL (ref 0.7–4.0)
MCH: 31.5 pg (ref 26.0–34.0)
MCHC: 33.8 g/dL (ref 30.0–36.0)
MCV: 93.3 fL (ref 80.0–100.0)
Monocytes Absolute: 0.6 10*3/uL (ref 0.1–1.0)
Monocytes Relative: 8 %
Neutro Abs: 5.4 10*3/uL (ref 1.7–7.7)
Neutrophils Relative %: 72 %
Platelets: 207 10*3/uL (ref 150–400)
RBC: 3.71 MIL/uL — ABNORMAL LOW (ref 4.22–5.81)
RDW: 14 % (ref 11.5–15.5)
WBC: 7.4 10*3/uL (ref 4.0–10.5)
nRBC: 0 % (ref 0.0–0.2)

## 2021-12-14 LAB — BASIC METABOLIC PANEL
Anion gap: 8 (ref 5–15)
BUN: 18 mg/dL (ref 8–23)
CO2: 26 mmol/L (ref 22–32)
Calcium: 8.3 mg/dL — ABNORMAL LOW (ref 8.9–10.3)
Chloride: 100 mmol/L (ref 98–111)
Creatinine, Ser: 0.73 mg/dL (ref 0.61–1.24)
GFR, Estimated: >60 mL/min (ref >60)
Glucose, Bld: 123 mg/dL — ABNORMAL HIGH (ref 70–99)
Potassium: 4.6 mmol/L (ref 3.5–5.1)
Sodium: 134 mmol/L — ABNORMAL LOW (ref 135–145)

## 2021-12-14 LAB — GLUCOSE, CAPILLARY
Glucose-Capillary: 145 mg/dL — ABNORMAL HIGH (ref 70–99)
Glucose-Capillary: 140 mg/dL — ABNORMAL HIGH (ref 70–99)
Glucose-Capillary: 111 mg/dL — ABNORMAL HIGH (ref 70–99)
Glucose-Capillary: 144 mg/dL — ABNORMAL HIGH (ref 70–99)

## 2021-12-14 NOTE — Progress Notes (Signed)
[facility] for Infectious Disease  Date of Admission:  11/30/2021           Reason for visit: Follow up on Gemella bacteremia  Current antibiotics: Penicillin 7/3 -- present Gentamicin 7/3 -- present   ASSESSMENT:    76 y.o. male admitted with:  Gemella sanguinous bacteremia: Patient's initial blood cultures were positive from 11/30/2021 and repeat cultures 12/05/2021 finalized as no growth.  Initial TTE was concerning for TAVR vegetation as well as possible involvement noted on TEE 12/08/2021.  He underwent cardiac CTA 12/13/2021 that did not note any vegetation or abscess.  Ultimately diagnosis of endocarditis seems difficult to exclude given his overall clinical picture with complication of epidural abscess and possible left shoulder infection.  ? If prior vegetation has embolized.  Lumbar discitis and epidural abscess L5-S1: Patient has a history of prior spinal fusion L2-L5.  He has been seen by neurosurgery during this admission and it is unclear whether surgical intervention would benefit him at this time versus continued medical management.  Planning to medically manage for now. Left shoulder pain: Status post IR guided aspiration 12/09/2021.  Cultures from this were negative, however, this was obtained after several days of antibiotics and cell count was notable for 27,000 neutrophil predominant WBC.  He has been seen by orthopedic surgery who feels that septic arthritis is less likely and has recommended possibly a steroid injection for symptomatic improvement of severe osteoarthritis. History of aortic stenosis status post TAVR: Completed in January 2023. History of AAA: Status post endovascular repair in January 2019.  RECOMMENDATIONS:    Continue penicillin 24,000,000 units every 24 hours as a continuous infusion.  Will plan for 8 weeks of therapy in the setting of undrained epidural abscess, bacteremia, and high clinical suspicion for endocarditis. Continue gentamicin 200 mg  daily per pharmacy for synergy.  We will plan 2 weeks of therapy ending on 12/18/2021.  Recommend that patient remain hospitalized for duration of his gentamicin therapy as I would not feel comfortable sending him home or to a SNF while on this medication. Okay to place PICC line at this time. Appreciate neurosurgery and orthopedic surgery evaluation. I would be hesitant to do a steroid injection in his shoulder given the concern that I have regarding septic arthritis. Agree with cardiology that he should have a dental consultation to further reduce risk of infections in the setting of his dentition. Anticipate repeat lumbar MRI in several weeks to follow-up on his epidural abscess. Will make determination on oral suppression following IV therapy in the future as an outpatient. See OP AT note below.  I will sign off for now, please call as needed.  Diagnosis: Bacteremia, epidural abscess, suspected endocarditis, suspected septic arthritis  Culture Result: Gemella sanguinis  Allergies  Allergen Reactions   Cefazolin Rash and Other (See Comments)    The patient had surgery and was given cefazolin intraop. ~ 10 days later he developed a rash confirmed by biopsy to be consistent w/ drug eruption. We cannot know for sure, but this is the most likely agent.      OPAT Orders Discharge antibiotics to be given via PICC line Discharge antibiotics: Per pharmacy protocol   Pen G 24 million units every 24 hours as a continuous infusion  Duration: 8 weeks End Date: 01/30/22  [facility] Care Per Protocol:  Home health RN for IV administration and teaching; PICC line care and labs.    Labs weekly while on IV antibiotics: _xxx_ CBC  with differential _xxx_ BMP __ CMP _xxx_ CRP _xxx_ ESR __ Vancomycin trough __ CK  _xxx_ Please pull PIC at completion of IV antibiotics __ Please leave PIC in place until doctor has seen patient or been notified  Fax weekly labs to [phone]  Clinic Follow  Up Appt: 12/30/21 @ 345pm with Dr Juleen China    Principal Problem:   Prosthetic valve endocarditis ([place]) Active Problems:   OSA (obstructive sleep apnea)   Tobacco use   Hyperlipidemia LDL goal <70   S/P TAVR (transcatheter aortic valve replacement)   CAP (community acquired pneumonia)   Accelerated hypertension   Acute respiratory failure with hypoxia (HCC)   COPD with acute exacerbation (HCC)    MEDICATIONS:    Scheduled Meds:  amLODipine  10 mg Oral Daily   arformoterol  15 mcg Nebulization BID   atorvastatin  40 mg Oral Daily   budesonide (PULMICORT) nebulizer solution  0.25 mg Nebulization BID   finasteride  5 mg Oral Daily   gabapentin  300 mg Oral BID   guaiFENesin  400 mg Oral BID   heparin  5,000 Units Subcutaneous Q8H   insulin aspart  0-9 Units Subcutaneous TID WC   metoprolol tartrate  25 mg Oral BID   nicotine  21 mg Transdermal Daily   polyethylene glycol  17 g Oral Daily   senna-docusate  1 tablet Oral BID   sertraline  50 mg Oral Daily   tamsulosin  0.4 mg Oral QHS   Continuous Infusions:  sodium chloride 10 mL/hr at 12/13/21 2030   gentamicin 200 mg (12/13/21 2030)   penicillin G potassium 12 Million Units in dextrose 5 % 500 mL continuous infusion 12 Million Units (12/13/21 2147)   PRN Meds:.sodium chloride, acetaminophen, albuterol, hydrALAZINE, morphine injection, naLOXone (NARCAN)  injection, ondansetron **OR** ondansetron (ZOFRAN) IV, oxyCODONE-acetaminophen  SUBJECTIVE:   24 hour events:  CT scan yesterday did not show any abscess or vegetation. TEE done 7/6 per cardiology note did not appreciate any SBE.  Recommend antibiotic duration based on epidural abscess/arthritis. Dr Onnie Graham saw yesterday and recommend to consider steroid injection in left shoulder as felt to be more c/w osteoarthritis. Afebrile Continues on penicillin and gentamicin Creatinine is stable.  Patient reports that his pain is stable today.  He has no fevers or chills.  His  breathing is also stable.  We discussed our antibiotic plan.  Review of Systems  All other systems reviewed and are negative.     OBJECTIVE:   Blood pressure 137/71, pulse 60, temperature 98 F (36.7 C), temperature source Axillary, resp. rate 14, height 5' 8" (1.727 m), weight 97.6 kg, SpO2 96 %. Body mass index is 32.72 kg/m.  Physical Exam Constitutional:      General: He is not in acute distress.    Comments: Chronically ill-appearing  HENT:     Mouth/Throat:     Comments: Dentition is poor Eyes:     Extraocular Movements: Extraocular movements intact.     Conjunctiva/sclera: Conjunctivae normal.  Cardiovascular:     Rate and Rhythm: Normal rate and regular rhythm.  Pulmonary:     Effort: Pulmonary effort is normal. No respiratory distress.  Abdominal:     General: There is no distension.     Palpations: Abdomen is soft.     Tenderness: There is no abdominal tenderness.  Musculoskeletal:     Cervical back: Normal range of motion and neck supple.     Comments: Left shoulder range of motion stable.  Band-Aid covering prior arthrocentesis site.  Skin:    General: Skin is warm and dry.     Findings: No rash.  Neurological:     General: No focal deficit present.     Mental Status: He is alert and oriented to person, place, and time.  Psychiatric:        Mood and Affect: Mood normal.        Behavior: Behavior normal.      Lab Results: Lab Results  Component Value Date   WBC 7.4 12/14/2021   HGB 11.7 (L) 12/14/2021   HCT 34.6 (L) 12/14/2021   MCV 93.3 12/14/2021   PLT 207 12/14/2021    Lab Results  Component Value Date   NA 134 (L) 12/14/2021   K 4.6 12/14/2021   CO2 26 12/14/2021   GLUCOSE 123 (H) 12/14/2021   BUN 18 12/14/2021   CREATININE 0.73 12/14/2021   CALCIUM 8.3 (L) 12/14/2021   GFRNONAA >60 12/14/2021   GFRAA 107 04/08/2020    Lab Results  Component Value Date   ALT 34 12/13/2021   AST 24 12/13/2021   ALKPHOS 74 12/13/2021   BILITOT 0.6  12/13/2021    No results found for: "CRP"     Component Value Date/Time   ESRSEDRATE 8 09/25/2019 1110     I have reviewed the micro and lab results in Epic.  Imaging: CT CORONARY MORPH W/CTA COR W/SCORE W/CA W/CM &/OR WO/CM  Addendum Date: 12/13/2021   ADDENDUM REPORT: 12/13/2021 10:19 CLINICAL DATA:  66M with AS s/p TAVR and echo concerning for endocarditis. Evaluate for aortic abscess EXAM: Cardiac/Coronary CTA TECHNIQUE: The patient was scanned on a [employer]. FINDINGS: A 100 kV retrospective scan was triggered in the descending thoracic aorta at 111 HU's. Axial non-contrast 3 mm slices were carried out through the heart. The data set was analyzed on a dedicated work station and scored using the [place]. Gantry rotation speed was 250 msecs and collimation was .6 mm. The 3D data set was reconstructed in 5% intervals of the R-R cycle. Phases were analyzed on a dedicated work station using MPR, MIP and VRT modes. The patient received 100 cc of contrast. Aortic Valve: S/p TAVR with 66m Sapien 3 valve. No aortic abscess seen. Coronary Arteries: Normal coronary origin. Right dominance. Study was without use of NTG ans is not sufficient to evaluate coronary artery stenosis. Calcium score 1489 (85th percentile) Left Ventricle: Mild dilatation Left Atrium: Moderate dilatation Pulmonary Veins: Normal configuration Right Ventricle: Mild dilatation Right Atrium: Mild dilatation Thoracic aorta: Normal size Pulmonary Arteries: Dilated main PA measuring 382mSystemic Veins: Normal drainage Pericardium: Normal thickness IMPRESSION: 1. No aortic abscess seen 2. S/p TAVR with 2936mapien 3 valve 3. Coronary calcium score 1489 (85th percentile) Electronically Signed   By: ChrOswaldo MilianD.   On: 12/13/2021 10:19   Result Date: 12/13/2021 EXAM: OVER-READ INTERPRETATION  CT CHEST The following report is a limited chest CT over-read performed by radiologist Dr. LeaYetta Glassman  [facility] [place] 12/13/2021. This over-read does not include interpretation of cardiac or coronary anatomy or pathology. The cardiac CTA interpretation by the cardiologist is attached. COMPARISON:  Heart CT dated June 01, 2021 FINDINGS: Vascular: Normal caliber thoracic aorta with severe calcified and noncalcified plaque. Prior transcatheter aortic valve replacement. Normal heart size. No pericardial effusion. No suspicious filling defects of the main pulmonary arteries. Mediastinum/Nodes: Esophagus is unremarkable. No pathologically enlarged lymph nodes seen in the chest. Lungs/Pleura: Central airways  are patent. Moderate centrilobular emphysema. Small solid pulmonary nodule of the right middle lobe measuring 4 mm on series 10, image 76, new when compared with June 01, 2021 prior. Trace left pleural effusion and bibasilar atelectasis. Upper Abdomen: No acute abnormality. Musculoskeletal: No chest wall mass or suspicious bone lesions identified. IMPRESSION: 1. No acute extracardiac abnormality. 2. Small solid pulmonary nodule of the right middle lobe measuring 4 mm, which is new when compared with June 01, 2021 prior. Recommend follow-up chest CT in 6 months. Electronically Signed: By: Yetta Glassman M.D. On: 12/13/2021 08:43     Imaging independently reviewed in Epic.    [facility] for Infectious Disease [facility] [phone] pager 12/14/2021, 10:00 AM

## 2021-12-14 NOTE — Progress Notes (Signed)
[facility]                                                                               Kyle Lewis, is a 76 y.o. male, DOB - [birth year], WNI:[id number] Admit date - 11/30/2021    Outpatient Primary MD for the patient is [name], MD  LOS - 14  days    Brief summary   Kyle Lewis is a 76 y.o. male with medical history significant of obesity, essential hypertension, COPD, OSA on bipap, coronary artery disease, abdominal aortic aneurysm repair, TAVR, hyperlipidemia, ongoing tobacco use, chronic back pain, who presented to the ED from home via EMS with complaints of increase back pain from chronic baseline x 1 week duration, despite use of home ultram and prednisone from 11/25/21.  In addition to back pain, endorsed cough/chills.  +Wheezing as well as hypoxemia to 83% in triage.     Work-up revealed community-acquired pneumonia, COPD exacerbation, with associated acute hypoxic respiratory failure.     Hospital course complicated by Gemella Sanguinis bacteremia with source suspected to be odontogenic from poor dentition.  His wife also reports that he never cleans his BiPAP at home.  CT maxillofacial showed no dental abscess.   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  The patient is currently on penicillin and gentamicin for synergy as recommended by infectious disease.  Pain control and bowel regimen in place.   Assessment & Plan    Assessment and Plan:   Acute on Chronic hypoxia and hypercarbic respiratory failure likely due to CAP and COPD exacerbation:   - completed the course of steroids.  - [place] oxygen to keep sats greater than 90%.  - continue with bronchodilators ,  mobilize and continue with Flutter valve.  - wean him off oxygen.     Gemella Sanguinous Bacteremia:   Source suspected to be odontogenic from poor dentition, wife also reports he never cleans his bipap at home,  CT maxillofacial showed no dental abscess. Patient cannot stand up to get the orthopantogram.  The patient is currently on penicillin infusion and plan for 8 weeks of therapy and gentamicin for synergy for another 2 weeks to complete the course on 12/18/2021 as recommended by infectious disease. PICC line approved by ID.   Pain control and bowel regimen in place. Appreciate infectious disease assistance with the management.  DENTAL CONSULT requested.    Left shoulder septic arthritis vs degenerative changes:  - s/p drain, gram stain , joint fluid drained.  - appreciate orthopedics recommendation.     Epidural abscess:  No surgical intervention needed.  Continue with IV antibiotics as per ID.  Pain control .  May need repeat Lumbar MRI in a few weeks.     AAA repair.  CTA noted no acute dissection or acute aortic process.  A tiny endoleak in the anterior endosac would be difficult to exclude, smaller than on previous imaging, potentially representing calcification, recommended follow-up Cardiology signed off.  Essential hypertension:  BP parameters are optimal.     Hyperlipidemia:  Continue with statin.     OSA;  Continue with bipap at night.    Body mass index is 32.72 kg/m. Obesity.  Outpatient follow up with PCP for weight loss .    Left renal cyst.  Outpatient follo wup .       Estimated body mass index is 32.72 kg/m as calculated from the following:   Height as of this encounter: '5\' 8"'$  (1.727 m).   Weight as of this encounter: 97.6 kg.  Code Status: full code.  DVT Prophylaxis:  heparin injection 5,000 Units Start: 11/30/21 1630   Level of Care: Level of care: Progressive Family Communication: none at bedside.   Disposition Plan:      Remains inpatient appropriate:  IV antibiotics.   Procedures:  None.   Consultants:   Cardiology ID Orthopedics.     Antimicrobials:   Anti-infectives (From admission, onward)    Start     Dose/Rate Route Frequency Ordered Stop   12/09/21 1800  gentamicin (GARAMYCIN) 200 mg in dextrose 5 % 50 mL IVPB        2.5 mg/kg  80.6 kg (Adjusted) 110 mL/hr over 30 Minutes Intravenous Every 24 hours 12/09/21 0853 12/18/21 2359   12/05/21 1800  gentamicin (GARAMYCIN) 240 mg in dextrose 5 % 50 mL IVPB  Status:  Discontinued        3 mg/kg  80.6 kg (Adjusted) 112 mL/hr over 30 Minutes Intravenous Every 24 hours 12/05/21 1317 12/09/21 0853   12/05/21 1400  penicillin G potassium 12 Million Units in dextrose 5 % 500 mL continuous infusion        12 Million Units 41.7 mL/hr over 12 Hours Intravenous Every 12 hours 12/05/21 1305     12/02/21 1800  vancomycin (VANCOREADY) IVPB 1250 mg/250 mL  Status:  Discontinued        1,250 mg 166.7 mL/hr over 90 Minutes Intravenous Every 24 hours 12/01/21 1610 12/05/21 1305   12/01/21 1800  azithromycin (ZITHROMAX) tablet 500 mg        500 mg Oral Every evening 12/01/21 0832 12/04/21 1714   12/01/21 1700  vancomycin (VANCOREADY) IVPB 1750 mg/350 mL        1,750 mg 175 mL/hr over 120 Minutes Intravenous  Once 12/01/21 1610 12/01/21 2045   11/30/21 1630  Ampicillin-Sulbactam (UNASYN) 3 g in sodium chloride 0.9 % 100 mL IVPB  Status:  Discontinued        3 g 200 mL/hr over 30 Minutes Intravenous Every 6 hours 11/30/21 1621 12/05/21 1305   11/30/21 1630  azithromycin (ZITHROMAX) 500 mg in sodium chloride 0.9 % 250 mL IVPB  Status:  Discontinued        500 mg 250 mL/hr over 60 Minutes Intravenous Every 24 hours 11/30/21 1622 12/01/21 0832   11/30/21 0930  Ampicillin-Sulbactam (UNASYN) 3 g in sodium chloride 0.9 % 100 mL IVPB        3 g 200 mL/hr over 30 Minutes Intravenous  Once 11/30/21 0923 11/30/21 1120        Medications  Scheduled Meds:   amLODipine  10 mg Oral Daily   arformoterol  15 mcg Nebulization BID   atorvastatin  40 mg Oral Daily   budesonide (PULMICORT) nebulizer solution  0.25 mg Nebulization BID   finasteride  5 mg Oral Daily   gabapentin  300 mg Oral BID   guaiFENesin  400 mg Oral BID   heparin  5,000 Units Subcutaneous Q8H   insulin aspart  0-9 Units Subcutaneous TID WC   metoprolol tartrate  25 mg Oral BID   nicotine  21 mg Transdermal Daily   polyethylene glycol  17 g Oral Daily   senna-docusate  1 tablet Oral BID   sertraline  50 mg Oral Daily   tamsulosin  0.4 mg Oral QHS   Continuous Infusions:  sodium chloride 10 mL/hr at 12/13/21 2030   gentamicin 200 mg (12/13/21 2030)   penicillin G potassium 12 Million Units in dextrose 5 % 500 mL continuous infusion 12 Million Units (12/14/21 1047)   PRN Meds:.sodium chloride, acetaminophen, albuterol, hydrALAZINE, morphine injection, naLOXone (NARCAN)  injection, ondansetron **OR** ondansetron (ZOFRAN) IV, oxyCODONE-acetaminophen    Subjective:   Abijah Roussel was seen and examined today.  No new complaints.   Objective:   Vitals:   12/14/21 0000 12/14/21 0500 12/14/21 0729 12/14/21 1139  BP:  (!) 166/83 137/71 130/65  Pulse: 62 66 60 61  Resp: '20 20 14 16  '$ Temp:  98 F (36.7 C) 98 F (36.7 C) 97.9 F (36.6 C)  TempSrc:  Axillary Axillary Oral  SpO2: 96% 98% 96% 96%  Weight:  97.6 kg    Height:        Intake/Output Summary (Last 24 hours) at 12/14/2021 1232 Last data filed at 12/14/2021 1000 Gross per 24 hour  Intake --  Output 2750 ml  Net -2750 ml    Filed Weights   12/12/21 0439 12/13/21 0355 12/14/21 0500  Weight: 99.6 kg 99.8 kg 97.6 kg     Exam General exam: Appears calm and comfortable  Respiratory system: Clear to auscultation. Respiratory effort normal. Cardiovascular system: S1 & S2 heard, RRR. No JVD,No pedal edema. Gastrointestinal system: Abdomen is nondistended, soft and nontender. Normal bowel sounds heard. Central  nervous system: Alert and oriented. No focal neurological deficits. Extremities: Symmetric 5 x 5 power. Skin: No rashes, lesions or ulcers Psychiatry: Mood & affect appropriate.      Data Reviewed:  I have personally reviewed following labs and imaging studies   CBC Lab Results  Component Value Date   WBC 7.4 12/14/2021   RBC 3.71 (L) 12/14/2021   HGB 11.7 (L) 12/14/2021   HCT 34.6 (L) 12/14/2021   MCV 93.3 12/14/2021   MCH 31.5 12/14/2021   PLT 207 12/14/2021   MCHC 33.8 12/14/2021   RDW 14.0 12/14/2021   LYMPHSABS 1.2 12/14/2021   MONOABS 0.6 12/14/2021   EOSABS 0.1 12/14/2021   BASOSABS 0.1 72/02/4708     Last metabolic panel Lab Results  Component Value Date   NA 134 (L) 12/14/2021   K 4.6 12/14/2021   CL 100 12/14/2021   CO2 26 12/14/2021   BUN 18 12/14/2021   CREATININE 0.73 12/14/2021   GLUCOSE 123 (H) 12/14/2021   GFRNONAA >60 12/14/2021   GFRAA 107 04/08/2020   CALCIUM 8.3 (L) 12/14/2021   PHOS 3.9 12/13/2021   PROT 5.5 (L) 12/13/2021   ALBUMIN 2.2 (L) 12/13/2021   LABGLOB 2.7 09/25/2019   AGRATIO 1.4 09/25/2019   BILITOT 0.6 12/13/2021   ALKPHOS 74 12/13/2021   AST 24 12/13/2021   ALT 34 12/13/2021   ANIONGAP 8 12/14/2021    CBG (last 3)  Recent Labs    12/13/21 2101 12/14/21 0737 12/14/21 1137  GLUCAP 137* 145* 140*       Coagulation Profile: No results for input(s): "INR", "PROTIME" in the last 168 hours.    Radiology Studies: CT  CORONARY MORPH W/CTA COR W/SCORE W/CA W/CM &/OR WO/CM  Addendum Date: 12/13/2021   ADDENDUM REPORT: 12/13/2021 10:19 CLINICAL DATA:  52M with AS s/p TAVR and echo concerning for endocarditis. Evaluate for aortic abscess EXAM: Cardiac/Coronary CTA TECHNIQUE: The patient was scanned on a [employer]. FINDINGS: A 100 kV retrospective scan was triggered in the descending thoracic aorta at 111 HU's. Axial non-contrast 3 mm slices were carried out through the heart. The data set was analyzed on a  dedicated work station and scored using the [place]. Gantry rotation speed was 250 msecs and collimation was .6 mm. The 3D data set was reconstructed in 5% intervals of the R-R cycle. Phases were analyzed on a dedicated work station using MPR, MIP and VRT modes. The patient received 100 cc of contrast. Aortic Valve: S/p TAVR with 63m Sapien 3 valve. No aortic abscess seen. Coronary Arteries: Normal coronary origin. Right dominance. Study was without use of NTG ans is not sufficient to evaluate coronary artery stenosis. Calcium score 1489 (85th percentile) Left Ventricle: Mild dilatation Left Atrium: Moderate dilatation Pulmonary Veins: Normal configuration Right Ventricle: Mild dilatation Right Atrium: Mild dilatation Thoracic aorta: Normal size Pulmonary Arteries: Dilated main PA measuring 326mSystemic Veins: Normal drainage Pericardium: Normal thickness IMPRESSION: 1. No aortic abscess seen 2. S/p TAVR with 2934mapien 3 valve 3. Coronary calcium score 1489 (85th percentile) Electronically Signed   By: ChrOswaldo MilianD.   On: 12/13/2021 10:19   Result Date: 12/13/2021 EXAM: OVER-READ INTERPRETATION  CT CHEST The following report is a limited chest CT over-read performed by radiologist Dr. LeaYetta Glassman [facility] [place] 12/13/2021. This over-read does not include interpretation of cardiac or coronary anatomy or pathology. The cardiac CTA interpretation by the cardiologist is attached. COMPARISON:  Heart CT dated June 01, 2021 FINDINGS: Vascular: Normal caliber thoracic aorta with severe calcified and noncalcified plaque. Prior transcatheter aortic valve replacement. Normal heart size. No pericardial effusion. No suspicious filling defects of the main pulmonary arteries. Mediastinum/Nodes: Esophagus is unremarkable. No pathologically enlarged lymph nodes seen in the chest. Lungs/Pleura: Central airways are patent. Moderate centrilobular emphysema. Small solid pulmonary nodule  of the right middle lobe measuring 4 mm on series 10, image 76, new when compared with June 01, 2021 prior. Trace left pleural effusion and bibasilar atelectasis. Upper Abdomen: No acute abnormality. Musculoskeletal: No chest wall mass or suspicious bone lesions identified. IMPRESSION: 1. No acute extracardiac abnormality. 2. Small solid pulmonary nodule of the right middle lobe measuring 4 mm, which is new when compared with June 01, 2021 prior. Recommend follow-up chest CT in 6 months. Electronically Signed: By: LeaYetta GlassmanD. On: 12/13/2021 08:43       VijHosie PoissonD. [facility] 12/14/2021, 12:32 PM  Available via Epic secure chat 7am-7pm After 7 pm, please refer to night coverage provider listed on amion.

## 2021-12-14 NOTE — Progress Notes (Signed)
Pharmacy Antibiotic Note  Kyle Lewis is a 76 y.o. 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 SCr stable at 0.73. Gent trough drawn last night was 0.9 about 26 hours after the previous dose. Today is D#10/14 gentamicin  Plan: Penicillin G 12 million units every 12 hours for 6 weeks minimum Continue Gentamicin 200 mg daily for 2 weeks Monitor renal function Will not repeat trough as approaching end of gentamicin therapy   Height: '5\' 8"'$  (172.7 cm) Weight: 97.6 kg (215 lb 2.7 oz) IBW/kg (Calculated) : 68.4  Temp (24hrs), Avg:98 F (36.7 C), Min:97.7 F (36.5 C), Max:98.2 F (36.8 C)  Recent Labs  Lab 12/08/21 0238 12/08/21 1801 12/09/21 0304 12/11/21 0134 12/12/21 0141 12/13/21 0424 12/13/21 2007 12/14/21 0324  WBC 12.7*  --  10.3  --  10.3 8.4  --  7.4  CREATININE 0.84  --  0.72 0.85 0.83 0.70  --  0.73  GENTTROUGH  --  1.0  --   --   --   --  0.9  --      Estimated Creatinine Clearance: 90.4 mL/min (by C-G formula based on SCr of 0.73 mg/dL).    Allergies  Allergen Reactions   Cefazolin Rash and Other (See Comments)    The patient had surgery and was given cefazolin intraop. ~ 10 days later he developed a rash confirmed by biopsy to be consistent w/ drug eruption. We cannot know for sure, but this is the most likely agent.       Thank you for allowing pharmacy to be a part of this patient's care.  Jimmy Footman, PharmD, BCPS, BCIDP Infectious Diseases Clinical Pharmacist Phone: [phone] 12/14/2021 7:51 AM

## 2021-12-14 NOTE — TOC Progression Note (Signed)
Transition of Care [facility]) - Progression Note    Patient Details  Name: Kyle Lewis MRN: [id number] Date of Birth: [birth year]  Transition of Care [facility]) CM/SW [place], [place] Phone Number: 12/14/2021, 1:22 PM  Clinical Narrative:     CSW spoke with Olivia Mackie with [employer] and informed her patients end date for antibiotics. CSW will continue to follow and assist with patients dc planning needs.  Expected Discharge Plan: [place] Barriers to Discharge: Continued Medical Work up  Expected Discharge Plan and Services Expected Discharge Plan: [place] In-house Referral: Clinical Social Work     Living arrangements for the past 2 months: Single Family Home                                       Social Determinants of Health (SDOH) Interventions    Readmission Risk Interventions    06/10/2021   10:04 AM  Readmission Risk Prevention Plan  Transportation Screening Complete  PCP or Specialist Appt within 5-7 Days Complete  Home Care Screening Complete  Medication Review (RN CM) Complete

## 2021-12-14 NOTE — Progress Notes (Signed)
PHARMACY CONSULT NOTE FOR:  OUTPATIENT  PARENTERAL ANTIBIOTIC THERAPY (OPAT)  Indication: Gemella bacteremia/epidural abscess/possible endocarditis Regimen: Pen G 24 million units every 24 hours as a continuous infusion End date: 01/30/22  IV antibiotic discharge orders are pended. To discharging provider:  please sign these orders via discharge navigator,  Select New Orders & click on the button choice - Manage This Unsigned Work.     Thank you for allowing pharmacy to be a part of this patient's care.  Jimmy Footman, PharmD, BCPS, BCIDP Infectious Diseases Clinical Pharmacist Phone: [phone] 12/14/2021, 9:17 AM

## 2021-12-14 NOTE — Progress Notes (Signed)
Placed patient on CPAP for the night via auto-mode.  

## 2021-12-15 ENCOUNTER — Inpatient Hospital Stay: Payer: Self-pay

## 2021-12-15 DIAGNOSIS — K032 Erosion of teeth: Secondary | ICD-10-CM

## 2021-12-15 DIAGNOSIS — K08109 Complete loss of teeth, unspecified cause, unspecified class: Secondary | ICD-10-CM

## 2021-12-15 DIAGNOSIS — K031 Abrasion of teeth: Secondary | ICD-10-CM

## 2021-12-15 DIAGNOSIS — K029 Dental caries, unspecified: Secondary | ICD-10-CM

## 2021-12-15 DIAGNOSIS — I10 Essential (primary) hypertension: Secondary | ICD-10-CM | POA: Diagnosis not present

## 2021-12-15 DIAGNOSIS — K0530 Chronic periodontitis, unspecified: Secondary | ICD-10-CM

## 2021-12-15 DIAGNOSIS — T826XXD Infection and inflammatory reaction due to cardiac valve prosthesis, subsequent encounter: Secondary | ICD-10-CM | POA: Diagnosis not present

## 2021-12-15 DIAGNOSIS — Z0120 Encounter for dental examination and cleaning without abnormal findings: Secondary | ICD-10-CM

## 2021-12-15 DIAGNOSIS — I38 Endocarditis, valve unspecified: Secondary | ICD-10-CM | POA: Diagnosis not present

## 2021-12-15 DIAGNOSIS — K036 Deposits [accretions] on teeth: Secondary | ICD-10-CM

## 2021-12-15 DIAGNOSIS — K030 Excessive attrition of teeth: Secondary | ICD-10-CM

## 2021-12-15 DIAGNOSIS — J9601 Acute respiratory failure with hypoxia: Secondary | ICD-10-CM | POA: Diagnosis not present

## 2021-12-15 LAB — GLUCOSE, CAPILLARY
Glucose-Capillary: 135 mg/dL — ABNORMAL HIGH (ref 70–99)
Glucose-Capillary: 122 mg/dL — ABNORMAL HIGH (ref 70–99)
Glucose-Capillary: 132 mg/dL — ABNORMAL HIGH (ref 70–99)
Glucose-Capillary: 195 mg/dL — ABNORMAL HIGH (ref 70–99)

## 2021-12-15 MED ORDER — HYDROMORPHONE HCL 1 MG/ML IJ SOLN
0.5000 mg | Freq: Once | INTRAMUSCULAR | Status: AC
  Administered 2021-12-15: 0.5 mg via INTRAVENOUS
  Filled 2021-12-15: qty 1

## 2021-12-15 MED ORDER — CHLORHEXIDINE GLUCONATE CLOTH 2 % EX PADS
6.0000 | MEDICATED_PAD | Freq: Every day | CUTANEOUS | Status: DC
Start: 2021-12-15 — End: 2021-12-16
  Administered 2021-12-15 – 2021-12-16 (×2): 6 via TOPICAL

## 2021-12-15 MED ORDER — OXYCODONE-ACETAMINOPHEN 5-325 MG PO TABS: 1.0000 | ORAL_TABLET | Freq: Four times a day (QID) | ORAL | Status: DC | PRN

## 2021-12-15 MED ORDER — SODIUM CHLORIDE 0.9% FLUSH: 10.0000 mL | INTRAVENOUS | Status: DC | PRN

## 2021-12-15 MED ORDER — SODIUM CHLORIDE 0.9% FLUSH
10.0000 mL | Freq: Two times a day (BID) | INTRAVENOUS | Status: DC
  Administered 2021-12-15 – 2021-12-16 (×2): 10 mL

## 2021-12-15 MED ORDER — HYDROMORPHONE HCL 1 MG/ML IJ SOLN
0.5000 mg | INTRAMUSCULAR | Status: DC | PRN
  Administered 2021-12-15: 0.5 mg via INTRAVENOUS
  Filled 2021-12-15: qty 1

## 2021-12-15 MED ORDER — GERHARDT'S BUTT CREAM
1.0000 | TOPICAL_CREAM | Freq: Three times a day (TID) | CUTANEOUS | Status: DC
  Administered 2021-12-15 – 2021-12-16 (×4): 1 via TOPICAL
  Filled 2021-12-15: qty 1

## 2021-12-15 NOTE — Progress Notes (Signed)
[facility]                                                                               [name], is a 76 y.o. male, DOB - 10/06/1945, EXN:[id number] Admit date - 11/30/2021    Outpatient Primary MD for the patient is [name], MD  LOS - 15  days    Brief summary   Kyle Lewis is a 76 y.o. male with medical history significant of obesity, essential hypertension, COPD, OSA on bipap, coronary artery disease, abdominal aortic aneurysm repair, TAVR, hyperlipidemia, ongoing tobacco use, chronic back pain, who presented to the ED from home via EMS with complaints of increase back pain from chronic baseline x 1 week duration, despite use of home ultram and prednisone from 11/25/21.  In addition to back pain, endorsed cough/chills.  +Wheezing as well as hypoxemia to 83% in triage.     Work-up revealed community-acquired pneumonia, COPD exacerbation, with associated acute hypoxic respiratory failure.     Hospital course complicated by Gemella Sanguinis bacteremia with source suspected to be odontogenic from poor dentition.  His wife also reports that he never cleans his BiPAP at home.  CT maxillofacial showed no dental abscess.   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  The patient is currently on penicillin and gentamicin for synergy as recommended by infectious disease.  Pain control and bowel regimen in place.   Assessment & Plan    Assessment and Plan:   Acute on Chronic hypoxia and hypercarbic respiratory failure likely due to CAP and COPD exacerbation:  - completed the course of steroids.  - [place] oxygen to keep sats greater than 90%.  - continue with bronchodilators ,  mobilize and continue with Flutter valve.  - wean him off oxygen.    Gemella Sanguinous Bacteremia:  Source suspected to be odontogenic from poor dentition, wife also reports he never cleans his bipap at home,  CT maxillofacial showed no dental abscess. Patient cannot stand up to get the orthopantogram.  The patient is currently on penicillin infusion and plan for 8 weeks of therapy and gentamicin for synergy for another 2 weeks to complete the course on 12/18/2021 as recommended by infectious disease. PICC line approved by ID.  Pain control and bowel regimen in place. Appreciate infectious disease assistance with the management.  Dental consult requested.    Left shoulder septic arthritis vs degenerative changes:  - s/p drain, gram stain , joint fluid drained.  - appreciate orthopedics recommendation. - pt reports pain is not well controlled, IV dilaudid added today.    Epidural abscess/ Chronic back pain:  No surgical intervention needed.  Continue with IV antibiotics as per ID.  Pain control .  May need repeat Lumbar MRI in a few weeks.    AAA repair. TAVR  CTA noted no acute dissection or acute aortic process.  A tiny endoleak in the anterior endosac would be difficult to exclude, smaller than on previous  imaging, potentially representing calcification, recommended follow-up Cardiology signed off.    Essential hypertension:  BP parameters are optimal. Continue with norvasc 10 mg daily, metoprolol 25 mg BID, .   Hyperlipidemia:  Continue with statin.    OSA;  Continue with bipap at night.    Body mass index is 32.01 kg/m. Obesity.  Outpatient follow up with PCP for weight loss .    Left renal cyst.  Outpatient follo wup .   BPH: Continue with flomax and finasteride.       Estimated body mass index is 32.01 kg/m as calculated from the following:   Height as of this encounter: '5\' 8"'$  (1.727 m).   Weight as of this encounter: 95.5 kg.  Code Status: full  code.  DVT Prophylaxis:  heparin injection 5,000 Units Start: 11/30/21 1630   Level of Care: Level of care: Progressive Family Communication: none at bedside.   Disposition Plan:     Remains inpatient appropriate:  IV antibiotics.   Procedures:  None.   Consultants:   Cardiology ID Orthopedics.     Antimicrobials:   Anti-infectives (From admission, onward)    Start     Dose/Rate Route Frequency Ordered Stop   12/09/21 1800  gentamicin (GARAMYCIN) 200 mg in dextrose 5 % 50 mL IVPB        2.5 mg/kg  80.6 kg (Adjusted) 110 mL/hr over 30 Minutes Intravenous Every 24 hours 12/09/21 0853 12/18/21 2359   12/05/21 1800  gentamicin (GARAMYCIN) 240 mg in dextrose 5 % 50 mL IVPB  Status:  Discontinued        3 mg/kg  80.6 kg (Adjusted) 112 mL/hr over 30 Minutes Intravenous Every 24 hours 12/05/21 1317 12/09/21 0853   12/05/21 1400  penicillin G potassium 12 Million Units in dextrose 5 % 500 mL continuous infusion        12 Million Units 41.7 mL/hr over 12 Hours Intravenous Every 12 hours 12/05/21 1305     12/02/21 1800  vancomycin (VANCOREADY) IVPB 1250 mg/250 mL  Status:  Discontinued        1,250 mg 166.7 mL/hr over 90 Minutes Intravenous Every 24 hours 12/01/21 1610 12/05/21 1305   12/01/21 1800  azithromycin (ZITHROMAX) tablet 500 mg        500 mg Oral Every evening 12/01/21 0832 12/04/21 1714   12/01/21 1700  vancomycin (VANCOREADY) IVPB 1750 mg/350 mL        1,750 mg 175 mL/hr over 120 Minutes Intravenous  Once 12/01/21 1610 12/01/21 2045   11/30/21 1630  Ampicillin-Sulbactam (UNASYN) 3 g in sodium chloride 0.9 % 100 mL IVPB  Status:  Discontinued        3 g 200 mL/hr over 30 Minutes Intravenous Every 6 hours 11/30/21 1621 12/05/21 1305   11/30/21 1630  azithromycin (ZITHROMAX) 500 mg in sodium chloride 0.9 % 250 mL IVPB  Status:  Discontinued        500 mg 250 mL/hr over 60 Minutes Intravenous Every 24 hours 11/30/21 1622 12/01/21 0832   11/30/21 0930  Ampicillin-Sulbactam  (UNASYN) 3 g in sodium chloride 0.9 % 100 mL IVPB        3 g 200 mL/hr over 30 Minutes Intravenous  Once 11/30/21 0923 11/30/21 1120        Medications  Scheduled Meds:  amLODipine  10 mg Oral Daily   arformoterol  15 mcg Nebulization BID   atorvastatin  40 mg Oral Daily   budesonide (PULMICORT) nebulizer solution  0.25 mg Nebulization  BID   finasteride  5 mg Oral Daily   gabapentin  300 mg Oral BID   Gerhardt's butt cream  1 Application Topical TID   guaiFENesin  400 mg Oral BID   heparin  5,000 Units Subcutaneous Q8H   insulin aspart  0-9 Units Subcutaneous TID WC   metoprolol tartrate  25 mg Oral BID   nicotine  21 mg Transdermal Daily   polyethylene glycol  17 g Oral Daily   senna-docusate  1 tablet Oral BID   sertraline  50 mg Oral Daily   tamsulosin  0.4 mg Oral QHS   Continuous Infusions:  sodium chloride Stopped (12/15/21 0000)   gentamicin Stopped (12/15/21 0000)   penicillin G potassium 12 Million Units in dextrose 5 % 500 mL continuous infusion 12 Million Units (12/15/21 0003)   PRN Meds:.sodium chloride, acetaminophen, albuterol, hydrALAZINE, HYDROmorphone (DILAUDID) injection, naLOXone (NARCAN)  injection, ondansetron **OR** ondansetron (ZOFRAN) IV, oxyCODONE-acetaminophen    Subjective:   Dewey Viens was seen and examined today.  Pain better controlled with dilaudid and percocet.   Objective:   Vitals:   12/15/21 0523 12/15/21 0744 12/15/21 0755 12/15/21 1159  BP: 130/61 124/70  119/69  Pulse: (!) 56 62  62  Resp: '19 15  20  '$ Temp: 97.7 F (36.5 C) 98.4 F (36.9 C)  98.3 F (36.8 C)  TempSrc: Oral Oral  Oral  SpO2: 98% 97% 94% 99%  Weight: 95.5 kg     Height:        Intake/Output Summary (Last 24 hours) at 12/15/2021 1502 Last data filed at 12/15/2021 1200 Gross per 24 hour  Intake 1617.06 ml  Output 2675 ml  Net -1057.94 ml    Filed Weights   12/13/21 0355 12/14/21 0500 12/15/21 0523  Weight: 99.8 kg 97.6 kg 95.5 kg      Exam General exam: Appears calm and comfortable  Respiratory system: Clear to auscultation. Respiratory effort normal. Cardiovascular system: S1 & S2 heard, RRR. No JVD, No pedal edema. Gastrointestinal system: Abdomen is nondistended, soft and nontender.  Normal bowel sounds heard. Central nervous system: Alert and oriented. No focal neurological deficits. Extremities: Symmetric 5 x 5 power. Skin: No rashes, lesions or ulcers Psychiatry: Mood & affect appropriate.       Data Reviewed:  I have personally reviewed following labs and imaging studies   CBC Lab Results  Component Value Date   WBC 7.4 12/14/2021   RBC 3.71 (L) 12/14/2021   HGB 11.7 (L) 12/14/2021   HCT 34.6 (L) 12/14/2021   MCV 93.3 12/14/2021   MCH 31.5 12/14/2021   PLT 207 12/14/2021   MCHC 33.8 12/14/2021   RDW 14.0 12/14/2021   LYMPHSABS 1.2 12/14/2021   MONOABS 0.6 12/14/2021   EOSABS 0.1 12/14/2021   BASOSABS 0.1 93/79/0240     Last metabolic panel Lab Results  Component Value Date   NA 134 (L) 12/14/2021   K 4.6 12/14/2021   CL 100 12/14/2021   CO2 26 12/14/2021   BUN 18 12/14/2021   CREATININE 0.73 12/14/2021   GLUCOSE 123 (H) 12/14/2021   GFRNONAA >60 12/14/2021   GFRAA 107 04/08/2020   CALCIUM 8.3 (L) 12/14/2021   PHOS 3.9 12/13/2021   PROT 5.5 (L) 12/13/2021   ALBUMIN 2.2 (L) 12/13/2021   LABGLOB 2.7 09/25/2019   AGRATIO 1.4 09/25/2019   BILITOT 0.6 12/13/2021   ALKPHOS 74 12/13/2021   AST 24 12/13/2021   ALT 34 12/13/2021   ANIONGAP 8 12/14/2021  CBG (last 3)  Recent Labs    12/14/21 2124 12/15/21 0742 12/15/21 1156  GLUCAP 144* 135* 122*       Coagulation Profile: No results for input(s): "INR", "PROTIME" in the last 168 hours.    Radiology Studies: [place] EKG SITE RITE  Result Date: 12/15/2021 If Site Rite image not attached, placement could not be confirmed due to current cardiac rhythm.      Hosie Poisson M.D. [facility] 12/15/2021, 3:02  PM  Available via Epic secure chat 7am-7pm After 7 pm, please refer to night coverage provider listed on amion.

## 2021-12-15 NOTE — Progress Notes (Signed)
States his back pain is slowly getting better, no leg pain, no change L chronic FD, otherwise LE strength intact. Continue Abx per ID. following

## 2021-12-15 NOTE — TOC Progression Note (Addendum)
Transition of Care [facility]) - Progression Note    Patient Details  Name: Kyle Lewis MRN: [id number] Date of Birth: 1945-10-26  Transition of Care [facility]) CM/SW [place], [place] Phone Number: 12/15/2021, 10:01 AM  Clinical Narrative:     CSW spoke with Olivia Mackie with [employer] who confirmed they can accept patient tomorrow for dc with IV antibiotics if medically ready for dc.CSW informed MD.CSW will continue to follow and assist with patients dc planning needs.  Expected Discharge Plan: [place] Barriers to Discharge: Continued Medical Work up  Expected Discharge Plan and Services Expected Discharge Plan: [place] In-house Referral: Clinical Social Work     Living arrangements for the past 2 months: Single Family Home                                       Social Determinants of Health (SDOH) Interventions    Readmission Risk Interventions    06/10/2021   10:04 AM  Readmission Risk Prevention Plan  Transportation Screening Complete  PCP or Specialist Appt within 5-7 Days Complete  Home Care Screening Complete  Medication Review (RN CM) Complete

## 2021-12-15 NOTE — Consult Note (Signed)
Department of Dental Medicine   Service Date:   12/15/2021 Admit Date:   11/30/2021  Patient Name:  Kyle Lewis Date of Birth:   [birth year] Medical Record Number: [id number]  Referring Provider:           Hosie Poisson, MD  INPATIENT CONSULTATION PLAN/RECOMMENDATIONS   ASSESSMENT There are no current clinical signs of acute odontogenic infection including abscess, edema or erythema, or suspicious lesion requiring biopsy.   Caries, moderately restored remaining dentition, severe attrition/wear, abfractions of lower anterior teeth; periodontal concerns including bone loss, accretions on teeth & inflamed gingival tissue.  RECOMMENDATIONS Establish dental care at an outside office of the patient's choice for routine care including cleanings/periodontal therapy & periodic exams once medically optimized.  PLAN Discuss case with medical team and coordinate treatment as needed. Please Note:  Clinical exam only was completed for consultation. Cannot rule out potential infection or severe caries w/ out necessary radiograph(s) (see following note).   Discussed in detail all treatment options and recommendations with the patient and they are agreeable to the plan.    Thank you for consulting with Hospital Dentistry and for the opportunity to participate in this patient's treatment.  Should you have any questions or concerns, please contact the [facility] at [phone].   12/15/2021  HISTORY OF PRESENT ILLNESS: Kyle Lewis is a very pleasant 76 y.o. male with history of HTN, peripheral vascular disease, mitral regurgitation, OSA (uses CPAP), COPD, ongoing tobacco use (cigarettes), diverticulitis, hyperlipidemia, arthritis, obesity, coronary artery disease, stroke and severe aortic stenosis (status-post TAVR on 06/07/2021 with Dr. Cyndia Bent) who presented to the ED on 11/30/21 and was subsequently admitted after work-up revealing community-acquired pneumonia and COPD exacerbation  with acute hypoxic respiratory failure.  Hospital course was complicated by bacteremia and TAVR endocarditis.  Per Progress Note from Dr. Karleen Hampshire this admission: " Hospital course complicated by Gemella Sanguinis bacteremia with source suspected to be odontogenic from poor dentition.  His wife also reports that he never cleans his BiPAP at home.  CT maxillofacial showed no dental abscess. 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  The patient is currently on penicillin and gentamicin for synergy as recommended by infectious disease.  Pain control and bowel regimen in place. " Hospital dentistry was consulted to complete a medically-necessary dental evaluation to rule-out source of infection.   Unfortunately an orthopantogram was unable to be taken due to the patient's inability to stand for imaging.  He did have a CT Maxillofacial without contrast on 12/05/21.  He also had a previous orthopantogram taken during his admission on 06/01/2021 prior to his TAVR (results copied below): FINDINGS:  Patient motion blurs the central incisors. Dental fillings are present in the upper and lower molars bilaterally. No significant dental caries are present. No significant periapical lucencies are present. The mandible is intact and located. IMPRESSION: 1. Dental fillings in the upper and lower molars bilaterally. 2. No significant dental caries.   DENTAL HISTORY: The patient reports that it has been about 3-4 years since he has last seen a dentist.  His wife says that her husband brushes his teeth twice a day and flosses. He seemed to be brushing his teeth when I came into his room for the consult.  He currently denies any dental/orofacial  pain or sensitivity.  Patient is able to manage oral secretions.  Patient denies dysphagia, odynophagia and dysphonia.   CHIEF COMPLAINT:  Dental evaluation to rule-out source of infection in the setting of bacteremia and TAVR endocarditis.  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   Accelerated hypertension 12/01/2021   Acute respiratory failure with hypoxia ([place]) 12/01/2021   COPD with acute exacerbation ([place]) 12/01/2021   CAP (community acquired pneumonia) 11/30/2021   H/O abdominal aortic aneurysm repair 11/09/2021   Olecranon bursitis of left elbow 11/08/2021   AAA (abdominal aortic aneurysm) ([place]) 11/07/2021   Abscess of left leg 11/07/2021   Adrenal mass ([place]) 11/07/2021   Allergy 11/07/2021   Arthritis 11/07/2021   Carotid artery occlusion 11/07/2021   Degeneration of cervical intervertebral disc 11/07/2021   Diverticulosis 11/07/2021   Dyspnea 11/07/2021   Heart murmur 11/07/2021   History of back surgery 11/07/2021   History of left knee replacement 11/07/2021   Meningioma ([place]) 11/07/2021   Ringing in ear 11/07/2021   Impairment of balance 10/27/2021   Muscle weakness 10/27/2021   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   Mitral regurgitation 06/02/2021   Acute on chronic diastolic heart failure ([place])    Acute respiratory failure with hypoxia and hypercapnia ([place]) 05/27/2021   Anxiety 04/08/2020   Body mass index (BMI) 34.0-34.9, adult 04/07/2020   Abnormal gait 07/31/2019   Inflammation of sacroiliac joint ([place]) 05/06/2019   COPD GOLD II 04/07/2019   Neck pain 03/25/2019   Preop cardiovascular exam 10/24/2018   Cigarette smoker 09/05/2018   CAD (coronary artery disease) 02/01/2018   Severe aortic stenosis 02/01/2018   Status post AAA (abdominal aortic aneurysm) repair 02/01/2018   Pre-procedure lab exam 01/19/2018   Hyperlipidemia LDL goal <70 01/19/2018   History of  craniotomy 01/11/2018   Blister of multiple sites of lower extremity 01/10/2018   Adenomatous colon polyp 01/10/2018   Other specified postprocedural states 09/25/2017   Osteoarthritis of right glenohumeral joint 08/14/2017   Lower urinary tract symptoms (LUTS) 08/06/2017   S/P lumbar spinal fusion 07/07/2015   Iliac artery aneurysm, left ([place]) 07/01/2015   Peripheral vascular disease ([place]) 07/01/2015   Degeneration of lumbar intervertebral disc 06/14/2015   History of right MCA stroke 07/21/2014   OAB (overactive bladder) 01/19/2014   Stroke ([place]) 01/01/2014   Exogenous obesity 11/08/2013   Hypertonicity of bladder 11/08/2013   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   Microscopic hematuria 11/08/2013   Tobacco use 11/08/2013   Nocturia 11/08/2013   Ventral hernia 11/08/2013   Carotid arterial disease ([place]) 05/23/2012   Hypertension    OSA (obstructive sleep apnea)    Chronic back pain    BPH with obstruction/lower urinary tract symptoms    OA (osteoarthritis) of knee 04/01/2012   Depression 03/17/2010   DIVERTICULOSIS, COLON 03/17/2010   COLONIC POLYPS, ADENOMATOUS, HX OF 03/17/2010   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   Abnormal gait 07/31/2019   Abscess of left leg    Acute on chronic diastolic heart failure (HCC)    Acute respiratory failure with hypoxia and hypercapnia ([place]) 05/27/2021   HC03   06/14/21    =  31  - 06/23/2021   Walked 250 ft  at a slow pace with a cane. Complained of back and left knee hurting with lowest sat 89% so d/c'd 02  Adenomatous colon polyp    Adrenal mass (HCC)    2 benign appearing left adrenal adenomas noted on 01/13/15 CT   Allergy    seasonal   Anxiety 04/08/2020   Arthritis    Blister of multiple sites of lower extremity 01/10/2018   Body mass index (BMI) 34.0-34.9, adult 04/07/2020   BPH with obstruction/lower urinary tract symptoms    Overview:  Probable based  on symptoms   CAD (coronary artery disease) 02/01/2018   Carotid arterial disease ([place]) 05/23/2012   Carotid artery occlusion    Chronic back pain    Cigarette smoker 09/05/2018   COLONIC POLYPS, ADENOMATOUS, HX OF 03/17/2010   Qualifier: Diagnosis of  By: Harlon Ditty CMA (AAMA), [place]ottie     COPD GOLD II 04/07/2019   Quit smoking 03/2019 - PFT's  04/07/2019  FEV1 1.58 (57 % ) ratio 0.54  p 0 % improvement from saba p nothing prior to study with DLCO  15.22 (66%) corrects to 3.05 (74%)  for alv volume and FV curve concave classically     Degeneration of cervical intervertebral disc    Degeneration of lumbar intervertebral disc 06/14/2015   Depression 03/17/2010   Qualifier: Diagnosis of  By: Nelson-Smith CMA (AAMA), Dottie     Diverticulosis    DIVERTICULOSIS, COLON 03/17/2010   Qualifier: Diagnosis of  By: Harlon Ditty CMA (AAMA), Dottie     Dyspnea    Exogenous obesity 11/08/2013   Overview:  With a nine pound weight gain since his last visit   Heart murmur    History of back surgery    Rods and Screws in back   History of craniotomy 01/11/2018   History of left knee replacement    History of right MCA stroke 07/21/2014   Hyperlipidemia LDL goal <70 01/19/2018   Hypertension    Hypertonicity of bladder 11/08/2013   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   Iliac artery aneurysm, left ([place]) 07/01/2015   Overview:  Follow-up to Dr. Oneida Alar and 06/10/2015 last visit   Impairment of balance 10/27/2021   Inflammation of sacroiliac joint ([place]) 05/06/2019   Lower urinary tract symptoms (LUTS) 08/06/2017   Meningioma ([place])    Microscopic hematuria 11/08/2013   Mitral regurgitation 06/02/2021   Muscle weakness 10/27/2021   Neck pain 03/25/2019   Nocturia    OA (osteoarthritis) of knee 04/01/2012   OAB (overactive bladder) 01/19/2014   OSA (obstructive sleep apnea)    Bipap per Chodri since ? 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 MAngelena Form& Dr. BCyndia Bent  Severe aortic stenosis 02/01/2018   ECHO 01/30/18 - Left ventricle: The cavity size was normal. There was moderate   focal basal hypertrophy of the septum. Systolic function was   vigorous. The estimated ejection fraction was in the range of 65%   to 70%. Wall motion was normal; there were no regional wall   motion abnormalities. Doppler parameters are consistent with   abnormal left  ventricular relaxation (grade 1 diastolic   dysfun   Status post AAA (abdominal aortic aneurysm) repair 02/01/2018   Stroke ([place]) 2013   tia no residual deficit from   Tobacco use 11/08/2013   Ventral hernia 11/08/2013   Overview:  S/p repair with alloderm mesh and s/p SB resection   Past Surgical History:  Procedure Laterality Date   ABDOMINAL AORTIC ANEURYSM REPAIR  2019   [place]  as child   BACK SURGERY  2018   Rods and Screws in Back lower back   BRAIN MENINGIOMA EXCISION  1991   menigioma   CARPAL TUNNEL RELEASE     left   EYE SURGERY Bilateral    Cataract   IRRIGATION AND DEBRIDEMENT ABSCESS Left 08/25/2020   Procedure: IRRIGATION AND DEBRIDEMENT ABSCESS LEFT LEG;  Surgeon: Evelina Bucy, DPM;  Location: [place] [facility];  Service: Podiatry;  Laterality: Left;   JOINT REPLACEMENT Left 04-01-12   Knee   LEFT HEART CATH AND CORONARY ANGIOGRAPHY N/A 09/15/2016   Procedure: Left Heart Cath and Coronary Angiography;  Surgeon: Nelva Bush, MD;  Location: [facility];  Service: Cardiovascular;  Laterality: N/A;   MAXIMUM ACCESS (MAS)POSTERIOR LUMBAR INTERBODY  FUSION (PLIF) 1 LEVEL N/A 07/07/2015   Procedure:  POSTERIOR LUMBAR INTERBODY FUSION (PLIF) Lumbar Four-Five with Pedicle Screw Fixation Lumbar Two-Five;Laminectomy Lumbar Two-Five;  Surgeon: Eustace Moore, MD;  Location: [place];  Service: Neurosurgery;  Laterality: N/A;   POSTERIOR LUMBAR INTERBODY FUSION (PLIF) Lumbar Four-Five with Pedicle Screw Fixation Lumbar Two-Five;Laminectomy Lumbar Two-Five   RIGHT/LEFT HEART CATH AND CORONARY ANGIOGRAPHY N/A 01/24/2018   Procedure: RIGHT/LEFT HEART CATH AND CORONARY ANGIOGRAPHY;  Surgeon: Nelva Bush, MD;  Location: [place] [facility];  Service: Cardiovascular;  Laterality: N/A;   RIGHT/LEFT HEART CATH AND CORONARY ANGIOGRAPHY N/A 05/31/2021   Procedure: RIGHT/LEFT HEART CATH AND CORONARY ANGIOGRAPHY;  Surgeon: Burnell Blanks, MD;  Location: [place] [facility];  Service: Cardiovascular;  Laterality: N/A;   TEE WITHOUT CARDIOVERSION N/A 06/07/2021   Procedure: TRANSESOPHAGEAL ECHOCARDIOGRAM (TEE);  Surgeon: Burnell Blanks, MD;  Location: [place] [place];  Service: Open Heart Surgery;  Laterality: N/A;   TEE WITHOUT CARDIOVERSION N/A 12/08/2021   Procedure: TRANSESOPHAGEAL ECHOCARDIOGRAM (TEE);  Surgeon: Berniece Salines, DO;  Location: [place];  Service: Cardiovascular;  Laterality: N/A;   TONSILLECTOMY  as child   TOTAL KNEE ARTHROPLASTY  04/01/2012   Procedure: TOTAL KNEE ARTHROPLASTY;  Surgeon: Gearlean Alf, MD;  Location: [facility];  Service: Orthopedics;  Laterality: Left;   TRANSCATHETER AORTIC VALVE REPLACEMENT, TRANSFEMORAL Bilateral 06/07/2021   Procedure: TRANSCATHETER AORTIC VALVE REPLACEMENT, RIGHT TRANSFEMORAL;  Surgeon: Burnell Blanks, MD;  Location: [facility];  Service: Open Heart Surgery;  Laterality: Bilateral;   TRANSURETHRAL RESECTION OF PROSTATE     ULTRASOUND GUIDANCE FOR VASCULAR ACCESS Bilateral 06/07/2021   Procedure: ULTRASOUND GUIDANCE FOR VASCULAR ACCESS;  Surgeon: Burnell Blanks, MD;  Location: [place];   Service: Open Heart Surgery;  Laterality: Bilateral;   UMBILICAL HERNIA REPAIR  2011   Allergies  Allergen Reactions   Cefazolin Rash and Other (See Comments)    The patient had surgery and was given cefazolin intraop. ~ 10 days later he developed a rash confirmed by biopsy to be consistent w/ drug eruption. We cannot know for sure, but this is the most likely agent.     Current Facility-Administered Medications  Medication Dose Route Frequency Provider Last Rate Last Admin   0.9 %  sodium chloride infusion   Intravenous PRN Rai, Ripudeep K, MD   Stopped at 12/15/21 0000   acetaminophen (TYLENOL) tablet 650 mg  650 mg Oral QHS PRN Clance Boll, MD   650 mg at 12/02/21 2237   albuterol (PROVENTIL) (2.5 MG/3ML) 0.083% nebulizer solution 2.5 mg  2.5 mg Nebulization Q2H PRN Clance Boll, MD       amLODipine (NORVASC) tablet 10 mg  10 mg Oral Daily Rai, Ripudeep K, MD   10 mg at 12/15/21 1053   arformoterol (BROVANA) nebulizer solution 15 mcg  15 mcg Nebulization BID Rai, Ripudeep K, MD   15 mcg at 12/15/21 0754   atorvastatin (LIPITOR) tablet 40 mg  40 mg Oral Daily Myles Rosenthal A, MD   40 mg at 12/15/21 1053   budesonide (PULMICORT) nebulizer solution 0.25 mg  0.25 mg Nebulization BID Rai, Ripudeep K, MD   0.25 mg at 12/15/21 0754   Chlorhexidine Gluconate Cloth 2 % PADS 6 each  6 each Topical Daily Hosie Poisson, MD       finasteride (PROSCAR) tablet 5 mg  5 mg Oral Daily Myles Rosenthal A, MD   5 mg at 12/15/21 1053   gabapentin (NEURONTIN) capsule 300 mg  300 mg Oral BID Myles Rosenthal A, MD   300 mg at 12/15/21 1053   gentamicin (GARAMYCIN) 200 mg in dextrose 5 % 50 mL IVPB  2.5 mg/kg (Adjusted) Intravenous Q24H Mignon Pine, DO   Stopped at 12/15/21 0000   Gerhardt's butt cream 1 Application  1 Application Topical TID Kayleen Memos, DO   1 Application at 30/86/57 1552   guaiFENesin tablet 400 mg  400 mg Oral BID Myles Rosenthal A, MD   400 mg at 12/15/21 1053    heparin injection 5,000 Units  5,000 Units Subcutaneous Q8H Myles Rosenthal A, MD   5,000 Units at 12/15/21 1548   hydrALAZINE (APRESOLINE) injection 10 mg  10 mg Intravenous Q6H PRN Rai, Ripudeep K, MD   10 mg at 12/01/21 0819   HYDROmorphone (DILAUDID) injection 0.5 mg  0.5 mg Intravenous Q4H PRN Hosie Poisson, MD   0.5 mg at 12/15/21 1125   insulin aspart (novoLOG) injection 0-9 Units  0-9 Units Subcutaneous TID WC Hosie Poisson, MD   1 Units at 12/15/21 1202   metoprolol tartrate (LOPRESSOR) tablet 25 mg  25 mg Oral BID Rai, Ripudeep K, MD   25 mg at 12/15/21 1053   naloxone (NARCAN) injection 0.4 mg  0.4 mg Intravenous PRN Florencia Reasons, MD       nicotine (NICODERM CQ - dosed in mg/24 hours) patch 21 mg  21 mg Transdermal Daily Rai, Ripudeep K, MD   21 mg at 12/15/21 1053   ondansetron (ZOFRAN) tablet 4 mg  4 mg Oral Q6H PRN Clance Boll, MD       Or   ondansetron [facility]) injection 4 mg  4 mg Intravenous Q6H PRN Clance Boll, MD       oxyCODONE-acetaminophen (PERCOCET/ROXICET) 5-325 MG per tablet 1 tablet  1 tablet Oral Q6H PRN Hosie Poisson, MD       penicillin G potassium 12 Million Units in dextrose 5 % 500 mL continuous infusion  12 Million Units Intravenous Q12H Mignon Pine, DO 41.7 mL/hr at 12/15/21 1701 12 Million Units at 12/15/21 1701   polyethylene glycol (MIRALAX / GLYCOLAX) packet 17 g  17 g Oral Daily Florencia Reasons, MD   17 g at 12/10/21 1020  senna-docusate (Senokot-S) tablet 1 tablet  1 tablet Oral BID Florencia Reasons, MD   1 tablet at 12/15/21 1053   sertraline (ZOLOFT) tablet 50 mg  50 mg Oral Daily Myles Rosenthal A, MD   50 mg at 12/15/21 1053   sodium chloride flush (NS) 0.9 % injection 10-40 mL  10-40 mL Intracatheter Q12H Hosie Poisson, MD       sodium chloride flush (NS) 0.9 % injection 10-40 mL  10-40 mL Intracatheter PRN Hosie Poisson, MD       tamsulosin (FLOMAX) capsule 0.4 mg  0.4 mg Oral QHS Myles Rosenthal A, MD   0.4 mg at 12/14/21 2221     LABS: Lab Results  Component Value Date   WBC 7.4 12/14/2021   HGB 11.7 (L) 12/14/2021   HCT 34.6 (L) 12/14/2021   MCV 93.3 12/14/2021   PLT 207 12/14/2021      Component Value Date/Time   NA 134 (L) 12/14/2021 0324   NA 145 (H) 07/13/2021 1628   K 4.6 12/14/2021 0324   CL 100 12/14/2021 0324   CO2 26 12/14/2021 0324   GLUCOSE 123 (H) 12/14/2021 0324   BUN 18 12/14/2021 0324   BUN 23 07/13/2021 1628   CREATININE 0.73 12/14/2021 0324   CREATININE 0.79 06/14/2021 1523   CALCIUM 8.3 (L) 12/14/2021 0324   GFRNONAA >60 12/14/2021 0324   GFRNONAA 93 04/08/2020 1506   GFRAA 107 04/08/2020 1506   Lab Results  Component Value Date   INR 1.0 12/07/2021   INR 0.9 06/06/2021   INR 1.0 09/11/2016   No results found for: "PTT"  Social History   Socioeconomic History   Marital status: Married    Spouse name: [name]    Number of children: 2   Years of education: [employer] education level: Not on file  Occupational History    Comment: Self employed Administrator retired  Tobacco Use   Smoking status: Former    Packs/day: 0.75    Years: 40.00    Total pack years: 30.00    Types: Cigarettes    Quit date: 05/05/2021    Years since quitting: 0.6   Smokeless tobacco: Never   Tobacco comments:    1/2 pack a day if that   Vaping Use   Vaping Use: Never used  Substance and Sexual Activity   Alcohol use: Yes    Comment: rare   Drug use: No   Sexual activity: Not Currently  Other Topics Concern   Not on file  Social History Narrative   Patient lives at home with his wife. Rhonda    Patient has a Financial risk analyst.    Patient has 2 sons.    Patient smokes a half pack a day.          Social Determinants of Health   Financial Resource Strain: Not on file  Food Insecurity: Not on file  Transportation Needs: Not on file  Physical Activity: Not on file  Stress: Not on file  Social Connections: Not on file  Intimate Partner Violence: Not on file   Family  History  Problem Relation Age of Onset   Heart disease Mother        Onset ~41 y/o   Hypertension Mother        Deceased from old age at 42   Hyperlipidemia Mother    Arthritis Mother    Diabetes Father        Deceased from old age at 42  Heart attack Father    Heart disease Father        CABG at age 91   Dementia Father 47   Arthritis Father    Prostate cancer Maternal Grandfather    Colon cancer Neg Hx    Esophageal cancer Neg Hx    Rectal cancer Neg Hx    Stomach cancer Neg Hx      REVIEW OF SYSTEMS:  Reviewed with the patient as per HPI. PSYCH:  Patient denies having dental phobia.     VITAL SIGNS: BP 137/67 (BP Location: Left Arm)   Pulse 62   Temp 98.3 F (36.8 C) (Oral)   Resp 17   Ht '5\' 8"'$  (1.727 m)   Wt 95.5 kg   SpO2 99%   BMI 32.01 kg/m      PHYSICAL EXAM: GENERAL:  Well-developed, comfortable and in no apparent distress. NEUROLOGICAL:  Alert and able to respond to questions, but appeared a little drowsy/tired. He was dozing off to sleep throughout consult. EXTRAORAL:  Facial symmetry present without any edema or erythema.  No swelling or lymphadenopathy.  TMJ asymptomatic without clicks or crepitations.  INTRAORAL:  Soft tissues appear well-perfused and mucous membranes moist.  FOM and vestibules soft and not raised. Oral cavity without mass or lesion. No signs of infection, parulis, sinus tract, edema or erythema evident upon exam.     DENTAL EXAM: Clinical findings charted.    OVERALL IMPRESSION:  Fair remaining dentition that is moderately restored.       ORAL HYGIENE:  Fair  PERIODONTAL:  Pink, healthy gingival tissue with blunted papilla with areas of inflamed, erythematous tissue.  Generalized plaque and calculus build-up. [+] Recession:  Generalized CARIES:  #13 EXISTING RESTORATIONS/PROSTH:  #2, #14, #15, #18 and #31 have full-coverage crowns; #19 and #30 have large existing amalgam restorations OCCLUSION: Non-functional teeth:  #21, #28,  #30 OTHER FINDINGS:   [+] Attrition/wear:  Generalized moderate-severe wear of incisal edges on anterior teeth (maxillary and mandibular) [+] Abfraction(s):  #22- #11F(V)   RADIOGRAPHIC EXAM:   12/06/2021 CT Maxillofacial results: FINDINGS: Osseous: No acute maxillofacial bone fracture. Bony orbital walls are intact. Mandible intact. Temporomandibular joints are aligned without dislocation. Mild periapical lucency associated with the left first maxillary molar (series 8, image 64). No additional significant periapical lucencies. Evaluation for dental caries is limited by artifact related to dental hardware. Orbits: Negative. No traumatic or inflammatory finding. Sinuses: There are a few scattered areas of mucosal thickening in the ethmoid air cells bilaterally. Paranasal sinuses and mastoid air cells are otherwise clear. No air-fluid levels. Soft tissues: No soft tissue swelling or fluid collections. No lymphadenopathy is evident. Limited intracranial: No significant or unexpected finding. IMPRESSION: 1. Mild periapical lucency associated with the left first maxillary molar. Evaluation for dental caries is limited by artifact related to dental hardware. No evidence of periapical abscess. 2. Minimal mucosal thickening within the ethmoid air cells. No evidence of odontogenic sinusitis. 3. No acute maxillofacial bone fracture.   ASSESSMENT:  1.  Prosthetic Valve Endocarditis + Bacteremia 2.  History of severe aortic stenosis 3.  Inpatient dental examination 4.  Attrition/wear 5.  Abfraction/flexure 6.  Gingival recession, generalized 7.  Accretions on teeth 8.  Chronic periodontitis 9.  Missing teeth 10.  Caries 11.  Postoperative bleeding risk   PLAN AND RECOMMENDATIONS: I discussed the risks, benefits, and complications of various scenarios with the patient in relationship to their medical and dental conditions, which included systemic infection such as endocarditis,  bacteremia or  other serious issues that could potentially occur/recur if dental/oral concerns are not addressed.  I explained that if any chronic or acute dental/oral infection(s) are addressed and subsequently not maintained following medical optimization, their risk of the previously mentioned complications are just as high and could potentially occur or recur in the future.  I explained all significant findings of the dental consultation with the patient including calculus or tartar build-up causing gingival inflammation and several teeth with crowns/large fillings that are susceptible to developing cavities if not properly maintained, and the recommended care including establishing care at an outside dental office for routine treatment and regular cleanings/periodontal therapy, fillings and exams as indicated in order to optimize them from a dental standpoint.  I also explained that I am unable to make a definitive diagnosis of any teeth or periodontal issues/concerns without necessary radiographs.  Since I am only able to complete a clinical exam, there could be issues that are not clinically visible or present such as periapical infection, bone loss or periodontal disease/infection and/or dental caries.  The patient verbalized understanding of all findings, discussion, and recommendations. We then discussed again (the patient and the patient's wife was included) the importance of finding a dentist to establish care with and develop a treatment plan which includes eliminating any source of current infection and routine care once the patient is able to and is medically optimized.  The patient and the patient's wife verbalized understanding. Also discussed how smoking is a major risk factor for developing severe periodontal disease, infection and other oral health issues. Plan to discuss all findings and recommendations with medical team and coordinate future care as needed.   The patient will need to establish care at a  dental office of his choice for routine dental care including replacement of missing teeth as needed, cleanings and exams.   All questions and concerns were invited and addressed.  The patient tolerated today's visit well.  - Logyn Kendrick B. Benson [place], DMD

## 2021-12-15 NOTE — Progress Notes (Signed)
Physical Therapy Treatment Patient Details Name: Kyle Lewis MRN: [id number] DOB: 1946-06-05 Today's Date: 12/15/2021   History of Present Illness 76 y.o. male presents to [facility] hospital on 11/30/2021 with increase in back pain as well as cough and chills. 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 PMH includes HTN, COPD, OSA, CAD, AAA, chronic back pain.    PT Comments    Pt remains limited by back and shoulder pain as well as deconditioning. Continue to recommend SNF at [place] for further therapy.  Recommendations for follow up therapy are one component of a multi-disciplinary discharge planning process, led by the attending physician.  Recommendations may be updated based on patient status, additional functional criteria and insurance authorization.  Follow Up Recommendations  Skilled nursing-short term rehab (<3 hours/day) Can patient physically be transported by private vehicle: No   Assistance Recommended at Discharge Frequent or constant Supervision/Assistance  Patient can return home with the following A lot of help with walking and/or transfers;A lot of help with bathing/dressing/bathroom;Assist for transportation;Assistance with cooking/housework;Help with stairs or ramp for entrance   Equipment Recommendations  None recommended by PT    Recommendations for Other Services       Precautions / Restrictions Precautions Precautions: Fall Restrictions Weight Bearing Restrictions: No     Mobility  Bed Mobility Overal bed mobility: Needs Assistance Bed Mobility: Rolling, Sidelying to Sit Rolling: Mod assist, +2 for safety/equipment Sidelying to sit: +2 for physical assistance, Mod assist       General bed mobility comments: Assist to bring shoulders and hips over to roll. Assist to elevate trunk into sitting and bring hips to EOB    Transfers Overall transfer level: Needs assistance Equipment used: Rolling walker (2  wheels) Transfers: Sit to/from Stand Sit to Stand: +2 physical assistance, Min assist, From elevated surface           General transfer comment: Assist to bring hips up    Ambulation/Gait Ambulation/Gait assistance: Min assist, +2 safety/equipment Gait Distance (Feet): 10 Feet (5' x 1, 10' x1) Assistive device: Rolling walker (2 wheels) Gait Pattern/deviations: Step-through pattern, Decreased step length - right, Decreased step length - left, Shuffle, Trunk flexed Gait velocity: decr Gait velocity interpretation: <1.31 ft/sec, indicative of household ambulator   General Gait Details: Assist for balance and support. Pt leaning forward onto walker and at times propping forearms on walker   Stairs             Wheelchair Mobility    Modified Rankin (Stroke Patients Only)       Balance Overall balance assessment: Needs assistance Sitting-balance support: Feet supported, Bilateral upper extremity supported Sitting balance-Leahy Scale: Poor Sitting balance - Comments: UE support and initial min assist for trunk support Postural control: Posterior lean Standing balance support: Bilateral upper extremity supported, Reliant on assistive device for balance Standing balance-Leahy Scale: Poor Standing balance comment: walker and min assist for static standing                            Cognition Arousal/Alertness: Awake/alert Behavior During Therapy: Flat affect Overall Cognitive Status: Impaired/Different from baseline Area of Impairment: Problem solving, Following commands                       Following Commands: Follows one step commands with increased time     Problem Solving: Slow processing, Requires verbal cues  Exercises      General Comments        Pertinent Vitals/Pain Pain Assessment Pain Assessment: Faces Faces Pain Scale: Hurts whole lot Pain Location: back and lt shoulder Pain Descriptors / Indicators: Grimacing,  Guarding, Moaning Pain Intervention(s): Limited activity within patient's tolerance, Repositioned, Monitored during session    Home Living                          Prior Function            PT Goals (current goals can now be found in the care plan section) Acute Rehab PT Goals Patient Stated Goal: Return home Progress towards PT goals: Progressing toward goals    Frequency    Min 2X/week      PT Plan Current plan remains appropriate    Co-evaluation              AM-PAC PT "6 Clicks" Mobility   Outcome Measure  Help needed turning from your back to your side while in a flat bed without using bedrails?: A Lot Help needed moving from lying on your back to sitting on the side of a flat bed without using bedrails?: A Lot Help needed moving to and from a bed to a chair (including a wheelchair)?: Total Help needed standing up from a chair using your arms (e.g., wheelchair or bedside chair)?: Total Help needed to walk in hospital room?: Total Help needed climbing 3-5 steps with a railing? : Total 6 Click Score: 8    End of Session Equipment Utilized During Treatment: Gait belt Activity Tolerance: Patient limited by pain Patient left: in chair;with call bell/phone within reach;with chair alarm set Nurse Communication: Mobility status PT Visit Diagnosis: Other abnormalities of gait and mobility (R26.89);Muscle weakness (generalized) (M62.81)     Time: 9417-4081 PT Time Calculation (min) (ACUTE ONLY): 23 min  Charges:  $Gait Training: 23-37 mins                     [place] 12/15/2021, 1:33 PM

## 2021-12-15 NOTE — Progress Notes (Signed)
Peripherally Inserted Central Catheter Placement  The IV Nurse has discussed with the patient and/or persons authorized to consent for the patient, the purpose of this procedure and the potential benefits and risks involved with this procedure.  The benefits include less needle sticks, lab draws from the catheter, and the patient may be discharged home with the catheter. Risks include, but not limited to, infection, bleeding, blood clot (thrombus formation), and puncture of an artery; nerve damage and irregular heartbeat and possibility to perform a PICC exchange if needed/ordered by physician.  Alternatives to this procedure were also discussed.  Bard Power PICC patient education guide, fact sheet on infection prevention and patient information card has been provided to patient /or left at bedside.    PICC Placement Documentation  PICC Double Lumen 04/06/10 Right Basilic 39 cm 0 cm (Active)  Indication for Insertion or Continuance of Line Prolonged intravenous therapies 12/15/21 1629  Exposed Catheter (cm) 0 cm 12/15/21 1629  Site Assessment Clean, Dry, Intact 12/15/21 1629  Lumen #1 Status Flushed;Saline locked;Blood return noted 12/15/21 1629  Lumen #2 Status Flushed;Saline locked;Blood return noted 12/15/21 1629  Dressing Type Transparent;Securing device 12/15/21 1629  Dressing Status Antimicrobial disc in place 12/15/21 1629  Dressing Intervention New dressing;Other (Comment) 12/15/21 1629  Dressing Change Due 12/22/21 12/15/21 1629    Wife signed at bedside per patient request   Synthia Innocent 12/15/2021, 4:30 PM

## 2021-12-15 NOTE — Progress Notes (Signed)
Occupational Therapy Treatment Patient Details Name: Kyle Lewis MRN: [id number] DOB: [birth year] Today's Date: 12/15/2021   History of present illness 76 y.o. male presents to [facility] hospital on 11/30/2021 with increase in back pain as well as cough and chills. 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 PMH includes HTN, COPD, OSA, CAD, AAA, chronic back pain.   OT comments  Difficult session, patient is on a great deal of pain, unclear how much this is different from his baseline.  Patient needing Min A of two to stand, had BM, and Total assist for peri care while standing.  Mod A for upper body dressing and Max A for lower body dressing for sock change.  Spouse in the room, and inquiring about post acute rehab.  SNF has been recommended, would anticipate a prolonged rehab stay unless the patient agrees to transition some mobility to wheelchair level.  OT can continue efforts in the acute setting, goals reviewed and adjusted as needed.     Recommendations for follow up therapy are one component of a multi-disciplinary discharge planning process, led by the attending physician.  Recommendations may be updated based on patient status, additional functional criteria and insurance authorization.    Follow Up Recommendations  Skilled nursing-short term rehab (<3 hours/day)    Assistance Recommended at Discharge Frequent or constant Supervision/Assistance  Patient can return home with the following  A little help with walking and/or transfers;A lot of help with bathing/dressing/bathroom;Assistance with cooking/housework;Assist for transportation;Help with stairs or ramp for entrance;Direct supervision/assist for medications management;Direct supervision/assist for financial management   Equipment Recommendations       Recommendations for Other Services      Precautions / Restrictions Precautions Precautions: Fall Restrictions Weight Bearing  Restrictions: No       Mobility Bed Mobility Overal bed mobility: Needs Assistance Bed Mobility: Sit to Supine       Sit to supine: Max assist        Transfers Overall transfer level: Needs assistance Equipment used: Rolling walker (2 wheels) Transfers: Sit to/from Stand, Bed to chair/wheelchair/BSC Sit to Stand: +2 physical assistance, Min assist, From elevated surface     Step pivot transfers: Min assist           Balance Overall balance assessment: Needs assistance Sitting-balance support: Feet supported, Bilateral upper extremity supported Sitting balance-Leahy Scale: Fair     Standing balance support: Bilateral upper extremity supported, Reliant on assistive device for balance Standing balance-Leahy Scale: Poor                             ADL either performed or assessed with clinical judgement   ADL   Eating/Feeding: Set up;Sitting   Grooming: Wash/dry hands;Wash/dry face;Sitting;Set up   Upper Body Bathing: Moderate assistance;Sitting   Lower Body Bathing: Maximal assistance;Sitting/lateral leans   Upper Body Dressing : Moderate assistance;Sitting   Lower Body Dressing: Total assistance;Maximal assistance;Sitting/lateral leans;Sit to/from stand   Toilet Transfer: Moderate assistance;BSC/3in1   Toileting- Clothing Manipulation and Hygiene: Total assistance;Sit to/from stand              Extremity/Trunk Assessment Upper Extremity Assessment Upper Extremity Assessment: RUE deficits/detail;LUE deficits/detail RUE Deficits / Details: decreased end range at shoulder, able to bring hand to mouth, grossly 3+/5 RUE Sensation: WNL RUE Coordination: WNL LUE Deficits / Details: Minimal AROM at shoulder, very painful.  Chronic.  Unable to bring hand to mouth LUE  Sensation: WNL LUE Coordination: decreased gross motor   Lower Extremity Assessment Lower Extremity Assessment: Defer to PT evaluation   Cervical / Trunk Assessment Cervical /  Trunk Assessment: Kyphotic;Other exceptions Cervical / Trunk Exceptions: long time chronic back issues - previous surgeries    Vision Patient Visual Report: No change from baseline Vision Assessment?: No apparent visual deficits   Perception Perception Perception: Not tested   Praxis Praxis Praxis: Not tested    Cognition Arousal/Alertness: Awake/alert Behavior During Therapy: Flat affect Overall Cognitive Status: Within Functional Limits for tasks assessed                                                             Pertinent Vitals/ Pain       Pain Assessment Pain Assessment: Faces Faces Pain Scale: Hurts whole lot Pain Location: back and lt shoulder Pain Descriptors / Indicators: Grimacing, Guarding, Moaning Pain Intervention(s): Monitored during session                                                          Frequency  Min 2X/week        Progress Toward Goals  OT Goals(current goals can now be found in the care plan section)  Progress towards OT goals: Progressing toward goals  Acute Rehab OT Goals OT Goal Formulation: With patient Time For Goal Achievement: 12/29/21 Potential to Achieve Goals: [place] Discharge plan remains appropriate    Co-evaluation                 AM-PAC OT "6 Clicks" Daily Activity     Outcome Measure   Help from another person eating meals?: None Help from another person taking care of personal grooming?: A Little Help from another person toileting, which includes using toliet, bedpan, or urinal?: A Lot Help from another person bathing (including washing, rinsing, drying)?: A Lot Help from another person to put on and taking off regular upper body clothing?: A Lot Help from another person to put on and taking off regular lower body clothing?: A Lot 6 Click Score: 15    End of Session Equipment Utilized During Treatment: Rolling walker (2 wheels)  OT Visit  Diagnosis: Unsteadiness on feet (R26.81);Other abnormalities of gait and mobility (R26.89);Muscle weakness (generalized) (M62.81);Pain Pain - Right/Left: Left Pain - part of body: Shoulder   Activity Tolerance Patient limited by pain   Patient Left in bed;with call bell/phone within reach;with family/visitor present   Nurse Communication Mobility status        Time: 4496-7591 OT Time Calculation (min): 28 min  Charges: OT General Charges $OT Visit: 1 Visit OT Treatments $Self Care/Home Management : 23-37 mins  12/15/2021  RP, OTR/L  Acute Rehabilitation Services  Office:  [phone]   Metta Clines 12/15/2021, 3:54 PM

## 2021-12-16 DIAGNOSIS — J441 Chronic obstructive pulmonary disease with (acute) exacerbation: Secondary | ICD-10-CM | POA: Diagnosis not present

## 2021-12-16 DIAGNOSIS — J9601 Acute respiratory failure with hypoxia: Secondary | ICD-10-CM | POA: Diagnosis not present

## 2021-12-16 DIAGNOSIS — M461 Sacroiliitis, not elsewhere classified: Secondary | ICD-10-CM

## 2021-12-16 DIAGNOSIS — E785 Hyperlipidemia, unspecified: Secondary | ICD-10-CM | POA: Diagnosis not present

## 2021-12-16 LAB — GLUCOSE, CAPILLARY
Glucose-Capillary: 130 mg/dL — ABNORMAL HIGH (ref 70–99)
Glucose-Capillary: 120 mg/dL — ABNORMAL HIGH (ref 70–99)

## 2021-12-16 LAB — HEMOGLOBIN AND HEMATOCRIT, BLOOD
HCT: 32.9 % — ABNORMAL LOW (ref 39.0–52.0)
Hemoglobin: 11.3 g/dL — ABNORMAL LOW (ref 13.0–17.0)

## 2021-12-16 LAB — OCCULT BLOOD X 1 CARD TO LAB, STOOL: Fecal Occult Bld: NEGATIVE

## 2021-12-16 MED ORDER — METOPROLOL TARTRATE 25 MG PO TABS: 25.0000 mg | ORAL_TABLET | Freq: Two times a day (BID) | ORAL | 1 refills | Status: DC

## 2021-12-16 MED ORDER — POLYETHYLENE GLYCOL 3350 17 G PO PACK: 17.0000 g | PACK | Freq: Every day | ORAL | 0 refills | Status: DC

## 2021-12-16 MED ORDER — NICOTINE 21 MG/24HR TD PT24: 21.0000 mg | MEDICATED_PATCH | Freq: Every day | TRANSDERMAL | 0 refills | Status: DC

## 2021-12-16 MED ORDER — SENNOSIDES-DOCUSATE SODIUM 8.6-50 MG PO TABS: 1.0000 | ORAL_TABLET | Freq: Two times a day (BID) | ORAL | Status: DC

## 2021-12-16 MED ORDER — OXYCODONE-ACETAMINOPHEN 5-325 MG PO TABS
1.0000 | ORAL_TABLET | Freq: Four times a day (QID) | ORAL | 0 refills | Status: AC | PRN
Start: 2021-12-16 — End: 2021-12-21

## 2021-12-16 MED ORDER — AMLODIPINE BESYLATE 10 MG PO TABS: 10.0000 mg | ORAL_TABLET | Freq: Every day | ORAL | 2 refills | Status: DC

## 2021-12-16 MED ORDER — PENICILLIN G POTASSIUM IV (FOR PTA / DISCHARGE USE ONLY): 24.0000 10*6.[IU] | INTRAVENOUS | 0 refills | Status: AC

## 2021-12-16 NOTE — Progress Notes (Signed)
ID Pharmacy Note  Spoke with Dr. Juleen China about patient discharge today. Patient was supposed to received gentamicin until 7/16 PM. Given discharge to SNF today, will move up dose for today 7/14 to receive prior to d/c and ok for patient to miss the last 2 doses to avoid giving gentamicin at [facility] per Dr. Juleen China.    Jimmy Footman, PharmD, BCPS, BCIDP Infectious Diseases Clinical Pharmacist Phone: [phone] 12/16/2021 1:19 PM

## 2021-12-16 NOTE — Discharge Summary (Signed)
Physician Discharge Summary   Patient: SILER MAVIS MRN: [id number] DOB: 1946/03/29  Admit date:     11/30/2021  Discharge date: 12/16/21  Discharge Physician: Hosie Poisson   PCP: [name], MD   Recommendations at discharge:  Please follow up with PCP in one week.  Please follow up with cardiology as scheduled.  Please schedule appt with dentist as outpatient.  Discharge Diagnoses: Principal Problem:    OSA (obstructive sleep apnea)   Tobacco use   Hyperlipidemia LDL goal <70   S/P TAVR (transcatheter aortic valve replacement)   CAP (community acquired pneumonia)   Accelerated hypertension   Acute respiratory failure with hypoxia ([place])   COPD with acute exacerbation (HCC)   Accretions on teeth   Chronic periodontitis   Teeth missing   Caries   Excessive dental attrition   Abfraction   Encounter for dental examination Lumbar discitis.     Hospital Course: COLLINS KERBY is a 76 y.o. male with medical history significant of obesity, essential hypertension, COPD, OSA on bipap, coronary artery disease, abdominal aortic aneurysm repair, TAVR, hyperlipidemia, ongoing tobacco use, chronic back pain, who presented to the ED from home via EMS with complaints of increase back pain from chronic baseline x 1 week duration, despite use of home ultram and prednisone from 11/25/21.  In addition to back pain, endorsed cough/chills.  +Wheezing as well as hypoxemia to 83% in triage.     Work-up revealed community-acquired pneumonia, COPD exacerbation, with associated acute hypoxic respiratory failure.     Hospital course complicated by Gemella Sanguinis bacteremia with source suspected to be odontogenic from poor dentition.  His wife also reports that he never cleans his BiPAP at home.  CT maxillofacial showed no dental abscess.   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  The patient is currently on penicillin and gentamicin for synergy as recommended by infectious disease.  Pain control and bowel regimen in place.    Assessment and Plan:   Acute on Chronic hypoxia and hypercarbic respiratory failure likely due to CAP and COPD exacerbation:  - completed the course of steroids.  - [place] oxygen to keep sats greater than 90%.  - continue with bronchodilators , mobilize and continue with Flutter valve.  - wean him off oxygen as tolerated.       Gemella Sanguinous Bacteremia:  Source suspected to be odontogenic from poor dentition, wife also reports he never cleans his bipap at home,  CT maxillofacial showed no dental abscess. Patient cannot stand up to get the orthopantogram.  The patient is currently on penicillin infusion and plan for 8 weeks of therapy and gentamicin for synergy for another 2 weeks to complete the course on 12/18/2021 as recommended by infectious disease. PICC line approved by ID.  Pain control and bowel regimen in place. Appreciate infectious disease assistance with the management.  Dental consult requested and recommendations given.      Left shoulder septic arthritis vs degenerative changes:  - s/p drain, gram stain , joint fluid drained.  - appreciate orthopedics recommendation. - pain control with oxycodone.      Epidural abscess/ Chronic back pain:  No surgical intervention needed.  Continue with IV antibiotics as per ID. Recommend outpatient follow up with ID on discharge.  Pain control .  May need repeat Lumbar MRI in a few weeks.      AAA repair. TAVR  CTA noted no acute dissection or acute aortic process.  A tiny endoleak in the anterior endosac would be difficult to exclude, smaller than on previous imaging, potentially representing calcification,  recommended follow-up Cardiology signed off.      Essential hypertension:  BP parameters are optimal. Continue with norvasc 10 mg daily, metoprolol 25 mg BID, .     Hyperlipidemia:  Continue with statin.      OSA;  Continue with bipap at night.      Body mass index is 32.01 kg/m. Obesity.  Outpatient follow up with PCP for weight loss .      Left renal cyst.  Outpatient follo wup .    BPH: Continue with flomax and finasteride.        Estimated body mass index is 32.01 kg/m as calculated from the following:   Height as of this encounter: $RemoveBeforeD'5\' 8"'[id number]$  (1.727 m).   Weight as of this encounter: 95.5 kg.          Consultants: Cardiology ID Orthopedics.  Dental consult.  Procedures performed: none  Disposition: Skilled nursing facility Diet recommendation:  Discharge Diet Orders (From admission, onward)     Start     Ordered   12/16/21 0000  Diet - low sodium heart healthy        12/16/21 1302           Cardiac diet DISCHARGE MEDICATION: Allergies as of 12/16/2021       Reactions   Cefazolin Rash, Other (See Comments)   The patient had surgery and was given cefazolin intraop. ~ 10 days later he developed a rash confirmed by biopsy to be consistent w/ drug eruption. We cannot know for sure, but this is the most likely agent.         Medication List     STOP taking these medications    amoxicillin 500 MG tablet Commonly known as: AMOXIL   furosemide 40 MG tablet Commonly known as: LASIX   mirabegron ER 25 MG Tb24 tablet Commonly known as: MYRBETRIQ   naproxen sodium 220 MG tablet Commonly known as: ALEVE   olmesartan 40 MG tablet Commonly known as: BENICAR   predniSONE 10 MG (21) Tbpk tablet Commonly known as: STERAPRED UNI-PAK 21 TAB   traMADol 50 MG tablet Commonly known as: ULTRAM       TAKE these medications    acetaminophen 650 MG CR tablet Commonly known as: TYLENOL Take 650 mg by mouth at bedtime as needed for pain.    albuterol 108 (90 Base) MCG/ACT inhaler Commonly known as: VENTOLIN HFA Inhale 2 puffs into the lungs every 6 (six) hours as needed for wheezing or shortness of breath.   amLODipine 10 MG tablet Commonly known as: NORVASC Take 1 tablet (10 mg total) by mouth daily. Start taking on: December 17, 2021   Aspirin Low Dose 81 MG tablet Generic drug: aspirin EC Take 1 tablet (81 mg total) by mouth daily. Swallow whole. What changed: when to take this   atorvastatin 40 MG tablet Commonly known as: LIPITOR Take 1 tablet (40 mg total) by mouth daily. What changed: when to take this   finasteride 5 MG tablet Commonly known as: PROSCAR Take 1 tablet (5 mg total) by mouth daily. What changed: when to take this   fluticasone furoate-vilanterol 200-25 MCG/ACT Aepb Commonly known as: BREO ELLIPTA Inhale 1 puff into the lungs  daily.   gabapentin 300 MG capsule Commonly known as: NEURONTIN Take 1 capsule (300 mg total) by mouth 2 (two) times daily. What changed: when to take this   metoprolol tartrate 25 MG tablet Commonly known as: LOPRESSOR Take 1 tablet (25 mg total) by mouth 2 (two) times daily. What changed: how much to take   nicotine 21 mg/24hr patch Commonly known as: NICODERM CQ - dosed in mg/24 hours Place 1 patch (21 mg total) onto the skin daily. Start taking on: December 17, 2021   oxyCODONE-acetaminophen 5-325 MG tablet Commonly known as: PERCOCET/ROXICET Take 1 tablet by mouth every 6 (six) hours as needed for up to 5 days for severe pain.   penicillin G  IVPB Inject 24 Million Units into the vein daily. As a continuous infusion. Indication:  Gemella bacteremia/epidural abscess/possible endocarditis First Dose: Yes Last Day of Therapy:  01/30/22 Labs - Once weekly:  CBC/D and BMP, Labs - Every other week:  ESR and CRP Method of administration: Elastomeric (Continuous infusion) Method of administration may be changed at the discretion of home infusion pharmacist based upon  assessment of the patient and/or caregiver's ability to self-administer the medication ordered.   polyethylene glycol 17 g packet Commonly known as: MIRALAX / GLYCOLAX Take 17 g by mouth daily. Start taking on: December 17, 2021   senna-docusate 8.6-50 MG tablet Commonly known as: Senokot-S Take 1 tablet by mouth 2 (two) times daily.   sertraline 50 MG tablet Commonly known as: Zoloft Take 1 tablet (50 mg total) by mouth daily.   tamsulosin 0.4 MG Caps capsule Commonly known as: FLOMAX TAKE 1 CAPSULE BY MOUTH ONCE DAILY AFTER SUPPER What changed: See the new instructions.               Discharge Care Instructions  (From admission, onward)           Start     Ordered   12/16/21 0000  Change dressing on IV access line weekly and PRN  (Home infusion instructions - [facility] )        12/16/21 1302            Follow-up Information     [name], MD Follow up in 1 week(s).   Specialties: Family Medicine, Emergency Medicine Contact information: [address] [place] [address] [phone]         Jenean Lindau, MD .   Specialty: Cardiology Contact information: [place] [address] [phone]                Discharge Exam: Danley Danker Weights   12/14/21 0500 12/15/21 0523 12/16/21 0453  Weight: 97.6 kg 95.5 kg 97.8 kg   General exam: Appears calm and comfortable  Respiratory system: Clear to auscultation. Respiratory effort normal. Cardiovascular system: S1 & S2 heard, RRR. No JVD,  No pedal edema. Gastrointestinal system: Abdomen is nondistended, soft and nontender. Normal bowel sounds heard. Central nervous system: Alert and oriented. No focal neurological deficits. Extremities: Symmetric 5 x 5 power. Skin: No rashes, lesions or ulcers Psychiatry:Mood & affect appropriate.    Condition at discharge: fair  The results of significant diagnostics from this hospitalization (including  imaging, microbiology, ancillary and laboratory) are listed below for reference.   Imaging Studies: [place] EKG SITE RITE  Result Date: 12/15/2021 If Site Rite image not attached, placement could not be confirmed due to current cardiac rhythm.  CT CORONARY MORPH W/CTA COR  W/SCORE W/CA W/CM &/OR WO/CM  Addendum Date: 12/13/2021   ADDENDUM REPORT: 12/13/2021 10:19 CLINICAL DATA:  70M with AS s/p TAVR and echo concerning for endocarditis. Evaluate for aortic abscess EXAM: Cardiac/Coronary CTA TECHNIQUE: The patient was scanned on a [employer]. FINDINGS: A 100 kV retrospective scan was triggered in the descending thoracic aorta at 111 HU's. Axial non-contrast 3 mm slices were carried out through the heart. The data set was analyzed on a dedicated work station and scored using the [place]. Gantry rotation speed was 250 msecs and collimation was .6 mm. The 3D data set was reconstructed in 5% intervals of the R-R cycle. Phases were analyzed on a dedicated work station using MPR, MIP and VRT modes. The patient received 100 cc of contrast. Aortic Valve: S/p TAVR with 44mm Sapien 3 valve. No aortic abscess seen. Coronary Arteries: Normal coronary origin. Right dominance. Study was without use of NTG ans is not sufficient to evaluate coronary artery stenosis. Calcium score 1489 (85th percentile) Left Ventricle: Mild dilatation Left Atrium: Moderate dilatation Pulmonary Veins: Normal configuration Right Ventricle: Mild dilatation Right Atrium: Mild dilatation Thoracic aorta: Normal size Pulmonary Arteries: Dilated main PA measuring 45mm Systemic Veins: Normal drainage Pericardium: Normal thickness IMPRESSION: 1. No aortic abscess seen 2. S/p TAVR with 73mm Sapien 3 valve 3. Coronary calcium score 1489 (85th percentile) Electronically Signed   By: Oswaldo Milian M.D.   On: 12/13/2021 10:19   Result Date: 12/13/2021 EXAM: OVER-READ INTERPRETATION  CT CHEST The following report is a limited chest CT  over-read performed by radiologist Dr. Yetta Glassman of [facility], [facility] on 12/13/2021. This over-read does not include interpretation of cardiac or coronary anatomy or pathology. The cardiac CTA interpretation by the cardiologist is attached. COMPARISON:  Heart CT dated June 01, 2021 FINDINGS: Vascular: Normal caliber thoracic aorta with severe calcified and noncalcified plaque. Prior transcatheter aortic valve replacement. Normal heart size. No pericardial effusion. No suspicious filling defects of the main pulmonary arteries. Mediastinum/Nodes: Esophagus is unremarkable. No pathologically enlarged lymph nodes seen in the chest. Lungs/Pleura: Central airways are patent. Moderate centrilobular emphysema. Small solid pulmonary nodule of the right middle lobe measuring 4 mm on series 10, image 76, new when compared with June 01, 2021 prior. Trace left pleural effusion and bibasilar atelectasis. Upper Abdomen: No acute abnormality. Musculoskeletal: No chest wall mass or suspicious bone lesions identified. IMPRESSION: 1. No acute extracardiac abnormality. 2. Small solid pulmonary nodule of the right middle lobe measuring 4 mm, which is new when compared with June 01, 2021 prior. Recommend follow-up chest CT in 6 months. Electronically Signed: By: Yetta Glassman M.D. On: 12/13/2021 08:43   MR LUMBAR SPINE WO CONTRAST  Result Date: 12/10/2021 CLINICAL DATA:  Epidural abscess EXAM: MRI LUMBAR SPINE WITHOUT CONTRAST TECHNIQUE: Multiplanar, multisequence MR imaging of the lumbar spine was performed. No intravenous contrast was administered. COMPARISON:  12/07/2021 FINDINGS: Segmentation:  Standard. Alignment: Grade 1 retrolisthesis at L2-3 and L3-4. Grade 1 anterolisthesis at L4-5 Vertebrae: Posterior instrumented fusion from L2-L5 with interbody spacer at L4-5. There is unchanged edema in the L5-S1 disc space but no endplate edema. Ventral epidural collection at the L5-S1 level is unchanged. Conus  medullaris and cauda equina: Conus extends to the L1 level. Conus and cauda equina appear normal. Paraspinal and other soft tissues: Large amount of magnetic susceptibility effects within the abdomen, likely an abdominal aortic aneurysm repair. Disc levels: L1-2: Disc space narrowing without spinal canal or neural foraminal stenosis. L2-3: Disc space  narrowing without stenosis. L3-4: Disc space narrowing with postfusion changes and bilateral mild foraminal stenosis. L4-5: Posterior decompression. Grade 1 anterolisthesis. Mild bilateral foraminal stenosis. No spinal canal stenosis. L5-S1: Attenuation of the thecal sac due to ventral epidural collection. Moderate facet arthrosis. No bony spinal canal stenosis. Moderate right and mild left foraminal stenosis. IMPRESSION: 1. Unchanged ventral epidural collection at the L5-S1 level, which remains concerning for abscess. 2. Moderate right and mild left L5-S1 neural foraminal stenosis. 3. Mild bilateral L3-4 and L4-5 foraminal stenosis. Electronically Signed   By: Ulyses Jarred M.D.   On: 12/10/2021 00:00   DG FLUORO GUIDED NEEDLE PLC ASPIRATION/INJECTION LOC  Result Date: 12/09/2021 INDICATION: Left shoulder pain, concern for infection. EXAM: FLUORO GUIDED NEEDLE PLACEMENT AND/OR ASPIRATION COMPARISON:  MRI of the left shoulder 12/09/2021. MEDICATIONS: 1% lidocaine FLUOROSCOPY TIME:  42 seconds (1.70 mGy). COMPLICATIONS: None immediate TECHNIQUE: Soyla Dryer, NP obtained informed consent from the patient prior to the procedure. This process included a discussion of procedural risks. The patient was positioned supine on the fluoroscopy table and the left upper extremity was placed in a slight degree of external rotation. A skin entry site was determined under fluoroscopy and marked. A timeout was performed. The operator donned sterile gloves and a mask. The anterior aspect of the shoulder was prepped and draped in the usual sterile fashion. The overlying soft  tissues were anesthetized with 1% lidocaine. Under intermittent fluoroscopic guidance, a 22 gauge spinal needle was advanced into the glenohumeral joint space. 4 mL of fluid was aspirated and sent to the laboratory for analysis. The needle was withdrawn and a dressing was placed at the skin entry site. The patient tolerated the procedure well without immediate post-procedure complication. The procedure was performed by Soyla Dryer, NP, and supervised and interpreted by Dr. Kellie Simmering. IMPRESSION: 1. Fluoroscopically-guided left shoulder joint aspiration yielding 4 mL of blood-tinged fluid. 2. No immediate post-procedure complication. Electronically Signed   By: Kellie Simmering D.O.   On: 12/09/2021 15:26   MR Shoulder Left W Wo Contrast  Result Date: 12/09/2021 CLINICAL DATA:  Septic arthritis suspected, shoulder. Muscle weakness. EXAM: MRI OF THE LEFT SHOULDER WITHOUT AND WITH CONTRAST TECHNIQUE: Multiplanar, multisequence MR imaging of the left shoulder was performed before and after the administration of intravenous contrast. CONTRAST:  38mL GADAVIST GADOBUTROL 1 MMOL/ML IV SOLN COMPARISON:  Limited correlation made with chest CTA 11/30/2021. FINDINGS: Despite efforts by the technologist and patient, mild motion artifact is present on today's exam and could not be eliminated. This reduces exam sensitivity and specificity. Rotator cuff: Diffuse rotator cuff tendinosis, especially within the supraspinatus and infraspinatus tendons. Partial intrasubstance tearing of the distal infraspinatus tendon. No full-thickness tendon tear or tendon retraction. Muscles: No significant focal rotator cuff muscular atrophy identified. There is mild edema and enhancement throughout the rotator cuff musculature without focal intramuscular fluid collection. Biceps long head: Tendinosis of the intra-articular portion. The tendon is normally located in the bicipital groove. Acromioclavicular Joint: The acromion is type 2. There  are moderate acromioclavicular degenerative changes without evidence of septic arthritis at the acromioclavicular joint. There is a small amount of fluid in the subacromial-subdeltoid bursa. There is diffuse enhancement throughout the bursa following contrast. Glenohumeral Joint: Moderate-sized complex shoulder joint effusion with most of the fluid anteriorly within the superior subscapularis recess. There is diffuse synovial enhancement of this joint fluid. Advanced glenohumeral degenerative changes with diffuse chondral thinning and osteophytes of the humeral head. There is remodeling of the glenoid with subchondral  cyst formation inferiorly. No suspicious erosions or osseous enhancement to suggest osteomyelitis. Labrum: Diffuse labral degeneration, especially posteriorly. Bones: As above, advanced glenohumeral degenerative changes. No evidence of acute fracture. No specific signs of osteomyelitis. Other: Peripheral to the joint, no definite fluid collections are identified. There is generalized soft tissue edema surrounding the shoulder. IMPRESSION: 1. Complex shoulder joint effusion with diffuse synovial enhancement, especially within the superior subscapularis recess. Findings are worrisome for septic arthritis or other inflammatory arthropathy. 2. No specific evidence of osteomyelitis. 3. Advanced glenohumeral degenerative changes with diffuse labral degeneration. 4. Diffuse rotator cuff tendinosis with partial infraspinatus tendon tearing. No full-thickness rotator cuff tear or focal muscular atrophy. Electronically Signed   By: Richardean Sale M.D.   On: 12/09/2021 09:59   ECHO TEE  Result Date: 12/08/2021    TRANSESOPHOGEAL ECHO REPORT   Patient Name:   KASHTYN JANKOWSKI Date of Exam: 12/08/2021 Medical Rec #:  [id number]       Height:       68.0 in Accession #:    [id number]      Weight:       218.9 lb Date of Birth:  [birth year]      BSA:          2.124 m Patient Age:    76 years        BP:           91/42  mmHg Patient Gender: M               HR:           66 bpm. Exam Location:  Inpatient Procedure: Transesophageal Echo, Color Doppler, 3D Echo and Cardiac Doppler Indications:     bacteremia  History:         Patient has prior history of Echocardiogram examinations, most                  recent 12/06/2021. COPD; Risk Factors:Hypertension, Dyslipidemia,                  Former Smoker and Sleep Apnea.                  Aortic Valve: 29 mm Edwards TAVR valve is present in the aortic                  position.  Sonographer:     Johny Chess RDCS Referring Phys:  [id number] Margie Billet Diagnosing Phys: Berniece Salines DO PROCEDURE: After discussion of the risks and benefits of a TEE, an informed consent was obtained from the patient. The transesophogeal probe was passed without difficulty through the esophogus of the patient. Imaged were obtained with the patient in a left lateral decubitus position. Local oropharyngeal anesthetic was provided with viscous lidocaine. Sedation performed by different physician. Image quality was adequate. The patient developed no complications during the procedure. IMPRESSIONS  1. There is a highly mobile mass on the base of the left coronary cusp highly suspicious for a vegetation. No appreciation of an abcess. Consider cardiac CTA if need for further evaluation. The aortic valve has been repaired/replaced. Aortic valve regurgitation is trivial. No aortic stenosis is present. There is a 29 mm Edwards TAVR valve present in the aortic position.  2. Left ventricular ejection fraction, by estimation, is 60 to 65%. The left ventricle has normal function.  3. Right ventricular systolic function is normal. The right ventricular size is normal.  4. No left atrial/left atrial appendage thrombus was  detected. The LAA emptying velocity was 59 cm/s.  5. The mitral valve is normal in structure. Mild mitral valve regurgitation.  6. Tricuspid leaflets appears to be mildly thickened.  7. There is mild (Grade II)  layered plaque.  8. Mildly dilated pulmonary artery. Conclusion(s)/Recommendation(s): Findings concerning for aortic valve vegetation. Consider cardiac cta for further clarification. FINDINGS  Left Ventricle: Left ventricular ejection fraction, by estimation, is 60 to 65%. The left ventricle has normal function. The left ventricular internal cavity size was normal in size. Right Ventricle: The right ventricular size is normal. No increase in right ventricular wall thickness. Right ventricular systolic function is normal. Left Atrium: Left atrial size was not well visualized. No left atrial/left atrial appendage thrombus was detected. The LAA emptying velocity was 59 cm/s. Right Atrium: Right atrial size was not well visualized. Mitral Valve: The mitral valve is normal in structure. Mild mitral valve regurgitation. Tricuspid Valve: Tricuspid leaflets appears to be mildly thickened. Tricuspid valve regurgitation is mild . No evidence of tricuspid stenosis. Aortic Valve: There is a highly mobile mass on the base of the left coronary cusp highly suspicious for a vegetation. No appreciation of an abcess. Consider cardiac CTA if need for further evaluation. The aortic valve has been repaired/replaced. Aortic valve regurgitation is trivial. No aortic stenosis is present. Aortic valve mean gradient measures 12.0 mmHg. Aortic valve peak gradient measures 23.4 mmHg. Aortic valve area, by VTI measures 1.75 cm. There is a 29 mm Edwards TAVR valve present in the aortic position. Pulmonic Valve: The pulmonic valve was normal in structure. Pulmonic valve regurgitation is not visualized. Aorta: The aortic root and ascending aorta are structurally normal, with no evidence of dilitation. There is mild (Grade II) layered plaque. Pulmonary Artery: The pulmonary artery is mildly dilated. Venous: The left upper pulmonary vein, left lower pulmonary vein, right upper pulmonary vein and right lower pulmonary vein are normal. A normal flow  pattern is recorded from the left upper pulmonary vein. IAS/Shunts: No atrial level shunt detected by color flow Doppler.  LEFT VENTRICLE PLAX 2D LVOT diam:     2.10 cm LV SV:         76 LV SV Index:   36 LVOT Area:     3.46 cm  AORTIC VALVE AV Area (Vmax):    1.50 cm AV Area (Vmean):   1.24 cm AV Area (VTI):     1.75 cm AV Vmax:           242.00 cm/s AV Vmean:          157.000 cm/s AV VTI:            0.431 m AV Peak Grad:      23.4 mmHg AV Mean Grad:      12.0 mmHg LVOT Vmax:         105.00 cm/s LVOT Vmean:        56.400 cm/s LVOT VTI:          0.218 m LVOT/AV VTI ratio: 0.51  SHUNTS Systemic VTI:  0.22 m Systemic Diam: 2.10 cm Kardie Tobb DO Electronically signed by Berniece Salines DO Signature Date/Time: 12/08/2021/3:22:09 PM    Final    MR LUMBAR SPINE WO CONTRAST  Result Date: 12/07/2021 CLINICAL DATA:  Low back pain, infection suspected. 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 EXAM: MRI LUMBAR SPINE WITHOUT CONTRAST TECHNIQUE: Multiplanar, multisequence MR imaging of the lumbar spine was performed. No intravenous contrast was administered. COMPARISON:  CTA chest/abdomen/pelvis 11/30/2021, lumbar spine MRI 06/03/2020 FINDINGS:  Segmentation: Standard; the lowest formed disc space is designated L5-S1. Alignment: There is exaggerated lumbar lordosis with stepwise grade 1 retrolisthesis of L1 on L2 through L3 on L4 and grade 1 anterolisthesis of L4 on L5 unchanged since the prior MRI from 2021. Vertebrae: Postsurgical changes reflecting posterior instrumented fusion and decompression from L2 through L5 are noted. Vertebral body heights are preserved. Background marrow signal is normal. There is degenerative marrow signal abnormality from L1 through L5. There is T1 hypointensity with faint edema in the marrow along the L5-S1 disc space with fluid signal in the disc and mild prevertebral/presacral edema (6-12 6-7). There is advanced facet arthropathy with fluid in both facet joints at this level but no convincing evidence of  septic arthritis within the confines of noncontrast technique. There is thin epidural fluid along the ventral aspect of the spinal canal at T12 through L3 (for example 8-1, 8-8, 8-14). At L5 and S1 there is a more prominent ventral epidural fluid collection measuring up to 1.0 cm in thickness suspicious for epidural abscess. Conus medullaris and cauda equina: Conus extends to the L1 level. Conus and cauda equina appear normal. Paraspinal and other soft tissues: As above, there is prevertebral edema centered at L5-S1 extending along the ventral aspect of the sacrum. The paraspinal soft tissues are otherwise unremarkable. There is significant artifact due to the aortic stent graft. Disc levels: T12-L1: No significant spinal canal or neural foraminal stenosis L1-L2: There is marked disc desiccation and narrowing with a mild disc bulge but no significant spinal canal or neural foraminal stenosis L2-L3: Status post posterior instrumented fusion. There is mild degenerative endplate spurring without significant spinal canal or neural foraminal stenosis L3-L4: Status post posterior fusion and decompression. Degenerative endplate spurring and facet arthropathy results in moderate left worse than right neural foraminal stenosis without significant spinal canal stenosis. L4-L5: Status post posterior fusion and decompression. There is degenerative endplate change and facet arthropathy resulting in at least moderate bilateral neural foraminal stenosis left worse than right, without significant spinal canal stenosis L5-S1: There is advanced bilateral facet arthropathy with effusions and degenerative endplate change with a mild bulge resulting in moderate to severe bilateral neural foraminal stenosis. There is severe spinal canal stenosis with compression of the cauda equina nerve roots due to the above described epidural fluid collection. IMPRESSION: 1. Epidural fluid collection along the ventral spinal canal extending from T12  through S1 most prominent at L5-S1 where it measures up to 1.0 cm in thickness raising suspicion for epidural abscess. 2. T1 hypointense marrow signal along the L5-S1 disc space with faint marrow edema raises suspicion for early discitis/osteomyelitis, but there is no progressed osseous destruction or irregularity at this time. There is associated mild prevertebral/presacral soft tissue edema. 3. Advanced facet arthropathy with bilateral effusions at L5-S1 without convincing evidence of septic arthritis. 4. Consider postcontrast imaging of the lumbar spine when the patient can tolerate for better evaluation of the above findings. 5. Postsurgical changes reflecting posterior fusion and decompression from L2 through L5 with moderate left worse than right neural foraminal stenosis at L3-L4, at least moderate bilateral neural foraminal stenosis at L4-L5, and moderate to severe bilateral neural foraminal stenosis at L5-S1. Electronically Signed   By: Valetta Mole M.D.   On: 12/07/2021 15:10   ECHOCARDIOGRAM COMPLETE  Result Date: 12/06/2021    ECHOCARDIOGRAM REPORT   Patient Name:   DEVAUGHN SAVANT Date of Exam: 12/06/2021 Medical Rec #:  [id number]       Height:  68.0 in Accession #:    [id number]      Weight:       217.8 lb Date of Birth:  11/08/1945      BSA:          2.119 m Patient Age:    46 years        BP:           156/73 mmHg Patient Gender: M               HR:           61 bpm. Exam Location:  Inpatient Procedure: 2D Echo, Cardiac Doppler and Color Doppler Indications:    Endocarditis  History:        Patient has prior history of Echocardiogram examinations, most                 recent 07/15/2021. CAD, Stroke; Risk Factors:Hypertension.                 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 Procedure Date: 06-07-21.  Sonographer:    Merrie Roof RDCS Referring Phys: RAI, RIPUDEEP, K IMPRESSIONS  1. TAVR valve vegetation - has TEE  scheduled for furher clarification.  2. Left ventricular ejection fraction, by estimation, is 60 to 65%. The left ventricle has normal function. The left ventricle has no regional wall motion abnormalities. There is moderate left ventricular hypertrophy. Left ventricular diastolic parameters are consistent with Grade I diastolic dysfunction (impaired relaxation).  3. Right ventricular systolic function is normal. The right ventricular size is mildly enlarged.  4. Right atrial size was mildly dilated.  5. The mitral valve is normal in structure. No evidence of mitral valve regurgitation. No evidence of mitral stenosis.  6. There is a shaggy mobile echodensity on the ventricular surface of the TAVR valve concerning for vegetation. Has TEE scheduled on 12/08/21 for clarification. The aortic valve has been repaired/replaced. Aortic valve regurgitation is not visualized. No aortic stenosis is present. 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 Procedure Date: 06-07-21. Echo findings are consistent with vegetation of the aortic prosthesis. Aortic valve mean gradient measures 10.0 mmHg. Aortic valve Vmax measures 2.26 m/s.  7. The inferior vena cava is normal in size with greater than 50% respiratory variability, suggesting right atrial pressure of 3 mmHg. Conclusion(s)/Recommendation(s): TAVR valve vegetation - has TEE scheduled for furher clarification. FINDINGS  Left Ventricle: Left ventricular ejection fraction, by estimation, is 60 to 65%. The left ventricle has normal function. The left ventricle has no regional wall motion abnormalities. The left ventricular internal cavity size was normal in size. There is  moderate left ventricular hypertrophy. Left ventricular diastolic parameters are consistent with Grade I diastolic dysfunction (impaired relaxation). Right Ventricle: The right ventricular size is mildly enlarged. No increase in right ventricular wall thickness. Right  ventricular systolic function is normal. Left Atrium: Left atrial size was normal in size. Right Atrium: Right atrial size was mildly dilated. Pericardium: There is no evidence of pericardial effusion. Mitral Valve: The mitral valve is normal in structure. No evidence of mitral valve regurgitation. No evidence of mitral valve stenosis. Tricuspid Valve: The tricuspid valve is normal in structure. Tricuspid valve regurgitation is not demonstrated. No evidence of tricuspid stenosis. Aortic Valve: There is a shaggy mobile echodensity on the ventricular surface  of the TAVR valve concerning for vegetation. Has TEE scheduled on 12/08/21 for clarification. The aortic valve has been repaired/replaced. Aortic valve regurgitation is not visualized. No aortic stenosis is present. Aortic valve mean gradient measures 10.0 mmHg. Aortic valve peak gradient measures 20.4 mmHg. Aortic valve area, by VTI measures 1.08 cm. 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 Procedure Date: 06-07-21. Pulmonic Valve: The pulmonic valve was normal in structure. Pulmonic valve regurgitation is not visualized. No evidence of pulmonic stenosis. Aorta: The aortic root is normal in size and structure. Venous: The inferior vena cava is normal in size with greater than 50% respiratory variability, suggesting right atrial pressure of 3 mmHg. IAS/Shunts: No atrial level shunt detected by color flow Doppler.  LEFT VENTRICLE PLAX 2D LVIDd:         5.10 cm   Diastology LVIDs:         3.30 cm   LV e' medial:    5.98 cm/s LV PW:         1.50 cm   LV E/e' medial:  13.7 LV IVS:        1.30 cm   LV e' lateral:   4.13 cm/s LVOT diam:     2.20 cm   LV E/e' lateral: 19.9 LV SV:         57 LV SV Index:   27 LVOT Area:     3.80 cm  RIGHT VENTRICLE RV Basal diam:  4.40 cm TAPSE (M-mode): 2.2 cm LEFT ATRIUM           Index        RIGHT ATRIUM           Index LA diam:      3.30 cm 1.56 cm/m   RA Area:     26.40 cm LA Vol  (A4C): 96.8 ml 45.68 ml/m  RA Volume:   87.50 ml  41.29 ml/m  AORTIC VALVE AV Area (Vmax):    1.14 cm AV Area (Vmean):   1.15 cm AV Area (VTI):     1.08 cm AV Vmax:           226.00 cm/s AV Vmean:          145.000 cm/s AV VTI:            0.530 m AV Peak Grad:      20.4 mmHg AV Mean Grad:      10.0 mmHg LVOT Vmax:         67.50 cm/s LVOT Vmean:        43.900 cm/s LVOT VTI:          0.150 m LVOT/AV VTI ratio: 0.28  AORTA Ao Root diam: 3.60 cm MITRAL VALVE MV Area (PHT): 2.83 cm     SHUNTS MV Decel Time: 268 msec     Systemic VTI:  0.15 m MV E velocity: 82.10 cm/s   Systemic Diam: 2.20 cm MV A velocity: 114.00 cm/s MV E/A ratio:  0.72 Candee Furbish MD Electronically signed by Candee Furbish MD Signature Date/Time: 12/06/2021/5:48:51 PM    Final    CT MAXILLOFACIAL WO CONTRAST  Result Date: 12/06/2021 CLINICAL DATA:  Bacteremia, dental caries EXAM: CT MAXILLOFACIAL WITHOUT CONTRAST TECHNIQUE: Multidetector CT imaging of the maxillofacial structures was performed. Multiplanar CT image reconstructions were also generated. RADIATION DOSE REDUCTION: This exam was performed according to the departmental dose-optimization program which includes automated exposure control, adjustment of the mA and/or kV according  to patient size and/or use of iterative reconstruction technique. COMPARISON:  CT head 03/07/2019.  CT head neck 04/30/2012 FINDINGS: Osseous: No acute maxillofacial bone fracture. Bony orbital walls are intact. Mandible intact. Temporomandibular joints are aligned without dislocation. Mild periapical lucency associated with the left first maxillary molar (series 8, image 64). No additional significant periapical lucencies. Evaluation for dental caries is limited by artifact related to dental hardware. Orbits: Negative. No traumatic or inflammatory finding. Sinuses: There are a few scattered areas of mucosal thickening in the ethmoid air cells bilaterally. Paranasal sinuses and mastoid air cells are otherwise clear.  No air-fluid levels. Soft tissues: No soft tissue swelling or fluid collections. No lymphadenopathy is evident. Limited intracranial: No significant or unexpected finding. IMPRESSION: 1. Mild periapical lucency associated with the left first maxillary molar. Evaluation for dental caries is limited by artifact related to dental hardware. No evidence of periapical abscess. 2. Minimal mucosal thickening within the ethmoid air cells. No evidence of odontogenic sinusitis. 3. No acute maxillofacial bone fracture. Electronically Signed   By: Davina Poke D.O.   On: 12/06/2021 14:01   MR THORACIC SPINE WO CONTRAST  Result Date: 12/05/2021 CLINICAL DATA:  Mid back pain EXAM: MRI THORACIC SPINE WITHOUT CONTRAST TECHNIQUE: Multiplanar, multisequence MR imaging of the thoracic spine was performed. No intravenous contrast was administered. COMPARISON:  None Available. FINDINGS: Alignment:  Physiologic. Vertebrae: No fracture, evidence of discitis, or bone lesion. Cord:  Normal signal and morphology. Paraspinal and other soft tissues: Negative. Disc levels: There is no spinal canal or neural foraminal stenosis. IMPRESSION: No discitis-osteomyelitis or epidural collection. Electronically Signed   By: Ulyses Jarred M.D.   On: 12/05/2021 22:05   CT Angio Chest/Abd/Pel for Dissection W and/or Wo Contrast  Result Date: 11/30/2021 CLINICAL DATA:  A 76 year old male presents for evaluation of suspected dissection in the setting of chest or back pain with aortic disease post recent repair. EXAM: CT ANGIOGRAPHY CHEST, ABDOMEN AND PELVIS TECHNIQUE: Non-contrast CT of the chest was initially obtained. Multidetector CT imaging through the chest, abdomen and pelvis was performed using the standard protocol during bolus administration of intravenous contrast. Multiplanar reconstructed images and MIPs were obtained and reviewed to evaluate the vascular anatomy. RADIATION DOSE REDUCTION: This exam was performed according to the  departmental dose-optimization program which includes automated exposure control, adjustment of the mA and/or kV according to patient size and/or use of iterative reconstruction technique. CONTRAST:  45mL OMNIPAQUE IOHEXOL 350 MG/ML SOLN COMPARISON:  None Available. 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 Noncontrast portion of the exam without evidence of intramural hematoma and with signs of extensive calcified aortic atherosclerosis. Ulcerated plaque along the under surface of the thoracic aortic arch with no change. No signs of dissection. No signs of vasculitis or aneurysm. Calcified and noncalcified plaque extends throughout the thoracic aorta. Standard three-vessel branching pattern in the chest. Heart size moderately enlarged with no change. No pericardial effusion or sign of pericardial nodularity. Central pulmonary vasculature is of normal caliber not well assessed due to phase of contrast enhancement currently. Mediastinum/Nodes: No adenopathy in the chest. Esophagus is grossly normal. Lungs/Pleura: Pulmonary emphysema as before. No consolidation. No sign of pleural effusion. Basilar atelectasis. There is a small amount of material in RIGHT lower lobe bronchi and some mild bronchial wall thickening the lung bases. Musculoskeletal: See below for full musculoskeletal details. Review of the MIP images confirms the above findings. CTA ABDOMEN AND PELVIS FINDINGS VASCULAR Aorta: Signs of aorto  bi-iliac endo grafting. 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 Distal thoracic aorta maximal caliber of 3.8 x 3.5 cm at the level of the aortic hiatus is stable. No signs of dissection. Celiac: Atherosclerotic plaque at the origin of the celiac. Vessel is widely patent with classic anatomy. Splenic artery is unremarkable. SMA: SMA is widely patent despite atherosclerotic changes. No signs of dissection  or aneurysm. No signs of vasculitis. Renals: Renal arteries are patent, accessory renal artery on the LEFT and RIGHT. IMA: Patent via collateral flow, occluded at the level of the aneurysm sac subtle increased density along the anterior aneurysm sac near the origin of the IMA makes it difficult to exclude the possibility of very subtle type 2 endoleak but density is unchanged compared to December of 2022 and there is no visible contrast opacification at the origin of the vessel. Inflow: Patent without evidence of aneurysm, dissection, vasculitis or significant stenosis. Veins: No obvious venous abnormality within the limitations of this arterial phase study. Proximal outflow: Iliac vessels into the upper thigh and into femoral vasculature are patent. Chronically occluded LEFT internal iliac artery. Review of the MIP images confirms the above findings. NON-VASCULAR Hepatobiliary: Is no hepatic enlargement or suspicious hepatic lesion. No pericholecystic stranding. Cholelithiasis. Pancreas: Normal contour without signs of inflammation. Spleen: Normal. Adrenals/Urinary Tract: Stable adrenal nodularity on the LEFT up to 2.5 cm greatest axial dimension. Density on noncontrast portion of the evaluation is 8 Hounsfield units compatible with adrenal adenoma for which no dedicated imaging follow-up is recommended. RIGHT adrenal is normal. Renal cortical scarring bilaterally without hydronephrosis cystic LEFT renal lesion along the cortex of the LEFT kidney compatible with a cyst (image 188/5) intermediate density lesion arising from the anterior cortex measuring 14 mm and with density of 35 Hounsfield units is indeterminate. Stomach/Bowel: Colonic diverticulosis. No acute gastrointestinal process. Signs of prior small-bowel resection. Lymphatic: No adenopathy in the abdomen or in the pelvis. Reproductive: Unremarkable by CT. Other: No ascites. Musculoskeletal: Cyst is is marked glenohumeral degenerative changes. Signs of  spinal fusion and spinal degenerative changes as before. Spinal fusion extending from L2 through L5 with similar degree of anterolisthesis of L4 on L5, grade 1 despite posterior interbody fusion and interbody cage placement at L4-5. Review of the MIP images confirms the above findings. IMPRESSION: 1. 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 No acute aortic process. 2. Tiny endoleak in the anterior endo sac would be difficult to entirely exclude though the endo sac is smaller than on previous imaging and the area of added density in the anterior aspect of the endo sac is stable potentially representing calcification. Suggest attention on follow-up. 3. Cholelithiasis without evidence of acute cholecystitis. 4. Stable LEFT adrenal adenoma. 5. Small anterior interpolar LEFT renal cortical lesion for which previous imaging suggested follow-up shows no interval change but may still represent a small solid renal neoplasm versus hemorrhagic cyst. Three to six-month follow-up with pre and postcontrast imaging may be helpful for further evaluation. 6. LEFT nephrolithiasis. 7. Cholelithiasis. Aortic Atherosclerosis (ICD10-I70.0) and Emphysema (ICD10-J43.9). Electronically Signed   By: Zetta Bills M.D.   On: 11/30/2021 13:26   DG Chest 2 View  Result Date: 11/30/2021 CLINICAL DATA:  Low oxygen saturation. EXAM: CHEST - 2 VIEW COMPARISON:  Chest radiograph 06/06/2021. FINDINGS: Mild bibasilar opacities. No visible pleural effusions or pneumothorax. Unchanged cardiomediastinal silhouette. Cardiac valve replacement. Severe bilateral shoulder degenerative change, partially imaged.  IMPRESSION: Mild bibasilar atelectasis and/or pneumonia. Electronically Signed   By: Margaretha Sheffield M.D.   On: 11/30/2021 08:40   DG Hip Unilat W or Wo Pelvis 2-3 Views Left  Result Date: 11/25/2021 CLINICAL DATA:  Left hip pain EXAM: DG HIP (WITH OR WITHOUT PELVIS) 2-3V LEFT COMPARISON:  None  Available. FINDINGS: There is no evidence of displaced hip fracture or dislocation. Asymmetric, moderately severe arthrosis of the left hip joint. Nonobstructive pattern of overlying bowel gas. Bi-iliac stents. IMPRESSION: No evidence of displaced hip fracture or dislocation. Asymmetric, moderately severe arthrosis of the left hip joint. Electronically Signed   By: Delanna Ahmadi M.D.   On: 11/25/2021 15:00    Microbiology: Results for orders placed or performed during the hospital encounter of 11/30/21  SARS Coronavirus 2 by RT PCR (hospital order, performed in [facility] hospital lab) *cepheid single result test* Anterior Nasal Swab     Status: None   Collection Time: 11/30/21 10:00 AM   Specimen: Anterior Nasal Swab  Result Value Ref Range Status   SARS Coronavirus 2 by RT PCR NEGATIVE NEGATIVE Final    Comment: (NOTE) SARS-CoV-2 target nucleic acids are NOT DETECTED.  The SARS-CoV-2 RNA is generally detectable in upper and lower respiratory specimens during the acute phase of infection. The lowest concentration of SARS-CoV-2 viral copies this assay can detect is 250 copies / mL. A negative result does not preclude SARS-CoV-2 infection and should not be used as the sole basis for treatment or other patient management decisions.  A negative result may occur with improper specimen collection / handling, submission of specimen other than nasopharyngeal swab, presence of viral mutation(s) within the areas targeted by this assay, and inadequate number of viral copies (<250 copies / mL). A negative result must be combined with clinical observations, patient history, and epidemiological information.  Fact Sheet for Patients:   https://www.patel.info/  Fact Sheet for Healthcare Providers: https://hall.com/  This test is not yet approved or  cleared by the [place] FDA and has been authorized for detection and/or diagnosis of SARS-CoV-2 by FDA  under an Emergency Use Authorization (EUA).  This EUA will remain in effect (meaning this test can be used) for the duration of the COVID-19 declaration under Section 564(b)(1) of the Act, 21 U.S.C. section 360bbb-3(b)(1), unless the authorization is terminated or revoked sooner.  Performed at [facility], [place] [address] [place], [place] [address]   Blood culture (routine x 2)     Status: Abnormal   Collection Time: 11/30/21 10:10 AM   Specimen: BLOOD  Result Value Ref Range Status   Specimen Description BLOOD SITE NOT SPECIFIED  Final   Special Requests   Final    BOTTLES DRAWN AEROBIC AND ANAEROBIC Blood Culture adequate volume   Culture  Setup Time   Final    GRAM POSITIVE COCCI IN CLUSTERS Organism ID to follow CRITICAL RESULT CALLED TO, READ BACK BY AND VERIFIED WITH:  C/ PHARMD E. MARTIN 12/01/21 1420 A. LAFRANCE IN BOTH AEROBIC AND ANAEROBIC BOTTLES    Culture (A)  Final    GEMELLA SANGUINIS SEE SEPARATE REPORT Performed at [facility], [place] [address] [place], [place] [address]    Report Status 12/10/2021 FINAL  Final  Blood Culture ID Panel (Reflexed)     Status: None   Collection Time: 11/30/21 10:10 AM  Result Value Ref Range Status   Enterococcus faecalis NOT DETECTED NOT DETECTED Final   Enterococcus Faecium NOT DETECTED NOT DETECTED  Final   Listeria monocytogenes NOT DETECTED NOT DETECTED Final   Staphylococcus species NOT DETECTED NOT DETECTED Final   Staphylococcus aureus (BCID) NOT DETECTED NOT DETECTED Final   Staphylococcus epidermidis NOT DETECTED NOT DETECTED Final   Staphylococcus lugdunensis NOT DETECTED NOT DETECTED Final   Streptococcus species NOT DETECTED NOT DETECTED Final   Streptococcus agalactiae NOT DETECTED NOT DETECTED Final   Streptococcus pneumoniae NOT DETECTED NOT DETECTED Final   Streptococcus pyogenes NOT DETECTED NOT DETECTED Final   A.calcoaceticus-baumannii NOT DETECTED NOT DETECTED Final   Bacteroides fragilis NOT  DETECTED NOT DETECTED Final   Enterobacterales NOT DETECTED NOT DETECTED Final   Enterobacter cloacae complex NOT DETECTED NOT DETECTED Final   Escherichia coli NOT DETECTED NOT DETECTED Final   Klebsiella aerogenes NOT DETECTED NOT DETECTED Final   Klebsiella oxytoca NOT DETECTED NOT DETECTED Final   Klebsiella pneumoniae NOT DETECTED NOT DETECTED Final   Proteus species NOT DETECTED NOT DETECTED Final   Salmonella species NOT DETECTED NOT DETECTED Final   Serratia marcescens NOT DETECTED NOT DETECTED Final   Haemophilus influenzae NOT DETECTED NOT DETECTED Final   Neisseria meningitidis NOT DETECTED NOT DETECTED Final   Pseudomonas aeruginosa NOT DETECTED NOT DETECTED Final   Stenotrophomonas maltophilia NOT DETECTED NOT DETECTED Final   Candida albicans NOT DETECTED NOT DETECTED Final   Candida auris NOT DETECTED NOT DETECTED Final   Candida glabrata NOT DETECTED NOT DETECTED Final   Candida krusei NOT DETECTED NOT DETECTED Final   Candida parapsilosis NOT DETECTED NOT DETECTED Final   Candida tropicalis NOT DETECTED NOT DETECTED Final   Cryptococcus neoformans/gattii NOT DETECTED NOT DETECTED Final    Comment: Performed at [facility], [address], [place], [place] [address]  Susceptibility, Aer + Anaerob     Status: Abnormal   Collection Time: 11/30/21 10:10 AM  Result Value Ref Range Status   Suscept, Aer + Anaerob Final report (A)  Corrected    Comment: (NOTE) Performed At: [facility] [address] [place], [place] [id number] Rush Farmer MD JT:[id number] CORRECTED ON 07/07 AT 1036: PREVIOUSLY REPORTED AS Preliminary report    Source of Sample   Final    [id number] [facility] Gemella sanguinis [facility] BLOOD SENSI NEEDED    Comment: Performed at [facility], [place] [address], [place], [place] [address]  Susceptibility Result     Status: Abnormal   Collection Time: 11/30/21 10:10 AM  Result Value Ref Range Status   Suscept Result 1 Gemella sanguinis (A)  Final     Comment: (NOTE) Identification performed by account, not confirmed by this laboratory. CEFOTAXIME      <= 1  UG/ML = SUSCEPTIBLE CLIDAMYCIN       >0.5 UG/ML = NON-SUSCEPTIBLE ERYTHROMYCIN     >0.5 UG/ML = NON-SUSCEPTIBLE    Antimicrobial Suscept Comment  Corrected    Comment: (NOTE)      ** S = Susceptible; I = Intermediate; R = Resistant **                   P = Positive; N = Negative            MICS are expressed in micrograms per mL   Antibiotic                 RSLT#1    RSLT#2    RSLT#3    RSLT#4 Ceftriaxone  S Levofloxacin                   S Meropenem                      S Penicillin                     S Vancomycin                     S Performed At: [facility] [place], [place] [id number] Rush Farmer MD FY:[id number]   Blood culture (routine x 2)     Status: Abnormal   Collection Time: 11/30/21 10:13 AM   Specimen: BLOOD  Result Value Ref Range Status   Specimen Description BLOOD SITE NOT SPECIFIED  Final   Special Requests   Final    BOTTLES DRAWN AEROBIC AND ANAEROBIC Blood Culture results may not be optimal due to an excessive volume of blood received in culture bottles   Culture  Setup Time   Final    GRAM POSITIVE COCCI IN CLUSTERS IN BOTH AEROBIC AND ANAEROBIC BOTTLES CRITICAL VALUE NOTED.  VALUE IS CONSISTENT WITH PREVIOUSLY REPORTED AND CALLED VALUE.    Culture (A)  Final    GEMELLA SANGUINIS SEE SEPARATE REPORT Performed at [facility], [place] [address] [place], [place] [address]    Report Status 12/10/2021 FINAL  Final  Respiratory (~20 pathogens) panel by PCR     Status: None   Collection Time: 11/30/21  4:18 PM   Specimen: Nasopharyngeal Swab; Respiratory  Result Value Ref Range Status   Adenovirus NOT DETECTED NOT DETECTED Final   Coronavirus 229E NOT DETECTED NOT DETECTED Final    Comment: (NOTE) The Coronavirus on the Respiratory Panel, DOES NOT test for the novel  Coronavirus (2019 nCoV)     Coronavirus HKU1 NOT DETECTED NOT DETECTED Final   Coronavirus NL63 NOT DETECTED NOT DETECTED Final   Coronavirus OC43 NOT DETECTED NOT DETECTED Final   Metapneumovirus NOT DETECTED NOT DETECTED Final   Rhinovirus / Enterovirus NOT DETECTED NOT DETECTED Final   Influenza A NOT DETECTED NOT DETECTED Final   Influenza B NOT DETECTED NOT DETECTED Final   Parainfluenza Virus 1 NOT DETECTED NOT DETECTED Final   Parainfluenza Virus 2 NOT DETECTED NOT DETECTED Final   Parainfluenza Virus 3 NOT DETECTED NOT DETECTED Final   Parainfluenza Virus 4 NOT DETECTED NOT DETECTED Final   Respiratory Syncytial Virus NOT DETECTED NOT DETECTED Final   Bordetella pertussis NOT DETECTED NOT DETECTED Final   Bordetella Parapertussis NOT DETECTED NOT DETECTED Final   Chlamydophila pneumoniae NOT DETECTED NOT DETECTED Final   Mycoplasma pneumoniae NOT DETECTED NOT DETECTED Final    Comment: Performed at [facility], [place] [address] [place], [place] [address]  Expectorated Sputum Assessment w Gram Stain, Rflx to Resp Cult     Status: None   Collection Time: 11/30/21  4:22 PM   Specimen: Sputum  Result Value Ref Range Status   Specimen Description SPUTUM  Final   Special Requests EXPECTORATED  Final   Sputum evaluation   Final    THIS SPECIMEN IS ACCEPTABLE FOR SPUTUM CULTURE Performed at [facility], [place] [address], [place], [place] [address]    Report Status 12/03/2021 FINAL  Final  Culture, Respiratory w Gram Stain     Status: None   Collection Time: 11/30/21  4:22 PM   Specimen: SPU  Result Value Ref Range Status   Specimen Description SPUTUM  Final   Special Requests EXPECTORATED Reflexed from [phone]  Final   Gram Stain   Final    NO SQUAMOUS EPITHELIAL CELLS SEEN MODERATE WBC SEEN NO ORGANISMS SEEN    Culture   Final    RARE Normal respiratory flora-no Staph aureus or Pseudomonas seen Performed at [facility], [address], [place], [place] [address]    Report Status  12/05/2021 FINAL  Final  Culture, blood (Routine X 2) w Reflex to ID Panel     Status: None   Collection Time: 12/05/21 12:27 PM   Specimen: BLOOD LEFT HAND  Result Value Ref Range Status   Specimen Description BLOOD LEFT HAND  Final   Special Requests   Final    BOTTLES DRAWN AEROBIC AND ANAEROBIC Blood Culture adequate volume   Culture   Final    NO GROWTH 5 DAYS Performed at [facility], [place] [address] [place], [place] [address]    Report Status 12/10/2021 FINAL  Final  Culture, blood (Routine X 2) w Reflex to ID Panel     Status: None   Collection Time: 12/05/21 12:28 PM   Specimen: BLOOD  Result Value Ref Range Status   Specimen Description BLOOD RIGHT ANTECUBITAL  Final   Special Requests   Final    BOTTLES DRAWN AEROBIC AND ANAEROBIC Blood Culture adequate volume   Culture   Final    NO GROWTH 5 DAYS Performed at [facility], [place] [address], [place], [place] [address]    Report Status 12/10/2021 FINAL  Final  Body fluid culture w Gram Stain     Status: None   Collection Time: 12/09/21  3:01 PM   Specimen: PATH Cytology Misc. fluid; Body Fluid  Result Value Ref Range Status   Specimen Description FLUID  Final   Special Requests SYNOVIAL  Final   Gram Stain   Final    FEW WBC PRESENT, PREDOMINANTLY PMN NO ORGANISMS SEEN    Culture   Final    NO GROWTH 3 DAYS Performed at [facility], [address], [place], [place] [address]    Report Status 12/13/2021 FINAL  Final    Labs: CBC: Recent Labs  Lab 12/12/21 0141 12/13/21 0424 12/14/21 0324  WBC 10.3 8.4 7.4  NEUTROABS 8.1* 6.1 5.4  HGB 11.8* 11.6* 11.7*  HCT 36.0* 34.0* 34.6*  MCV 95.5 92.9 93.3  PLT 212 199 582   Basic Metabolic Panel: Recent Labs  Lab 12/11/21 0134 12/12/21 0141 12/13/21 0424 12/14/21 0324  NA 135 135 132* 134*  K 4.5 4.9 4.6 4.6  CL 95* 95* 100 100  CO2 $Re'31 31 26 26  'Jhz$ GLUCOSE 149* 165* 114* 123*  BUN $Re'20 19 17 18  'Min$ CREATININE 0.85 0.83 0.70 0.73  CALCIUM  8.2* 8.4* 8.0* 8.3*  MG  --   --  2.2  --   PHOS  --   --  3.9  --    Liver Function Tests: Recent Labs  Lab 12/13/21 0424  AST 24  ALT 34  ALKPHOS 74  BILITOT 0.6  PROT 5.5*  ALBUMIN 2.2*   CBG: Recent Labs  Lab 12/15/21 1156 12/15/21 1655 12/15/21 2119 12/16/21 0753 12/16/21 1127  GLUCAP 122* 132* 195* 130* 120*    Discharge time spent: 42 minutes  Signed: Hosie Poisson, MD [facility] 12/16/2021

## 2021-12-16 NOTE — TOC Transition Note (Addendum)
Transition of Care [facility]) - CM/SW Discharge Note   Patient Details  Name: Kyle Lewis MRN: [id number] Date of Birth: 08-14-1945  Transition of Care [facility]) CM/SW Contact:  Milas Gain, [place] Phone Number: 12/16/2021, 1:45 PM   Clinical Narrative:     Patient will DC to: [facility]  Anticipated DC date: 12/16/2021  Family notified: [name]  Transport by: Corey Harold  ?  Per MD patient ready for DC to [facility] . RN, patient, patient's family, and facility notified of DC. Patients spouse confirmed will bring patients bipap from home to facility.Discharge Summary sent to facility. RN given number for report tele# [phone] EX:229, RM# 081.DC packet on chart. Ambulance transport requested for patient.  CSW signing off.     Barriers to Discharge: Continued Medical Work up   Patient Goals and CMS Choice Patient states their goals for this hospitalization and ongoing recovery are:: SNF CMS Medicare.gov Compare Post Acute Care list provided to:: Patient Choice offered to / list presented to : Patient  Discharge Placement                       Discharge Plan and Services In-house Referral: Clinical Social Work                                   Social Determinants of Health ([place]) Interventions     Readmission Risk Interventions    06/10/2021   10:04 AM  Readmission Risk Prevention Plan  Transportation Screening Complete  PCP or Specialist Appt within 5-7 Days Complete  Home Care Screening Complete  Medication Review (RN CM) Complete

## 2021-12-16 NOTE — Care Management Important Message (Signed)
Important Message  Patient Details  Name: PRATIK DALZIEL MRN: [id number] Date of Birth: 15-Jun-1945   Medicare Important Message Given:  Yes     [name] 12/16/2021, 9:29 AM

## 2021-12-16 NOTE — Progress Notes (Signed)
RT received called from RN about nebs being charted against due to patient not being available. This RT charted against nebs as "patient not available" since another RT reportedly went to see patient and told this RT he was not available due to being asleep on CPAP on the time of arrival. Patient was reportedly not in any distress and these nebs were maintenance medications. RN gave nebs to patient.

## 2021-12-19 ENCOUNTER — Telehealth: Payer: Self-pay

## 2021-12-19 NOTE — Telephone Encounter (Signed)
Transition Care Management Unsuccessful Follow-up Telephone Call  Date of discharge and from where:  12/16/2021, [facility]   Attempts:  1st Attempt  Reason for unsuccessful TCM follow-up call:  Unable to leave message, finally reached patient scheduled for 01/04/2022 '@330pm'$ 

## 2021-12-22 ENCOUNTER — Ambulatory Visit: Payer: Medicare Other | Admitting: Cardiology

## 2021-12-28 ENCOUNTER — Other Ambulatory Visit: Payer: Self-pay | Admitting: Neurological Surgery

## 2021-12-28 DIAGNOSIS — M5416 Radiculopathy, lumbar region: Secondary | ICD-10-CM

## 2021-12-30 ENCOUNTER — Ambulatory Visit: Payer: Medicare Other | Admitting: Internal Medicine

## 2022-01-02 ENCOUNTER — Other Ambulatory Visit: Payer: Self-pay

## 2022-01-02 ENCOUNTER — Encounter: Payer: Self-pay | Admitting: Internal Medicine

## 2022-01-02 ENCOUNTER — Ambulatory Visit (INDEPENDENT_AMBULATORY_CARE_PROVIDER_SITE_OTHER): Payer: Medicare Other | Admitting: Internal Medicine

## 2022-01-02 ENCOUNTER — Ambulatory Visit: Payer: Medicare Other | Admitting: Podiatry

## 2022-01-02 VITALS — BP 116/67 | HR 66 | Temp 97.7°F | Resp 16

## 2022-01-02 DIAGNOSIS — I38 Endocarditis, valve unspecified: Secondary | ICD-10-CM | POA: Diagnosis not present

## 2022-01-02 DIAGNOSIS — T826XXA Infection and inflammatory reaction due to cardiac valve prosthesis, initial encounter: Secondary | ICD-10-CM | POA: Diagnosis not present

## 2022-01-02 DIAGNOSIS — T826XXD Infection and inflammatory reaction due to cardiac valve prosthesis, subsequent encounter: Secondary | ICD-10-CM | POA: Diagnosis present

## 2022-01-02 NOTE — Progress Notes (Addendum)
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   Accelerated hypertension 12/01/2021   Acute respiratory failure with hypoxia ([place]) 12/01/2021   COPD with acute exacerbation ([place]) 12/01/2021   CAP (community acquired pneumonia) 11/30/2021   H/O abdominal aortic aneurysm repair 11/09/2021   Olecranon bursitis of left elbow 11/08/2021   AAA (abdominal aortic aneurysm) ([place]) 11/07/2021   Abscess of left leg 11/07/2021   Adrenal mass ([place]) 11/07/2021   Allergy 11/07/2021   Arthritis 11/07/2021   Carotid artery occlusion 11/07/2021   Degeneration of cervical intervertebral disc 11/07/2021   Diverticulosis 11/07/2021   Dyspnea 11/07/2021   Heart murmur 11/07/2021   History of back surgery 11/07/2021   History of left knee replacement 11/07/2021   Meningioma ([place]) 11/07/2021   Ringing in ear 11/07/2021   Impairment of balance 10/27/2021   Muscle weakness 10/27/2021   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   Mitral regurgitation 06/02/2021   Acute on chronic diastolic heart failure ([place])    Acute respiratory failure with hypoxia and hypercapnia ([place]) 05/27/2021   Anxiety 04/08/2020   Body mass index (BMI) 34.0-34.9, adult 04/07/2020   Abnormal gait 07/31/2019   Inflammation of sacroiliac joint ([place]) 05/06/2019   COPD GOLD II 04/07/2019   Neck pain 03/25/2019   Preop cardiovascular exam 10/24/2018   Cigarette smoker 09/05/2018   CAD (coronary artery disease) 02/01/2018   Severe aortic stenosis 02/01/2018   Status post AAA (abdominal aortic aneurysm) repair 02/01/2018   Pre-procedure lab exam 01/19/2018   Hyperlipidemia LDL goal <70 01/19/2018   History of craniotomy 01/11/2018   Blister of multiple sites of lower extremity 01/10/2018    Adenomatous colon polyp 01/10/2018   Other specified postprocedural states 09/25/2017   Osteoarthritis of right glenohumeral joint 08/14/2017   Lower urinary tract symptoms (LUTS) 08/06/2017   S/P lumbar spinal fusion 07/07/2015   Iliac artery aneurysm, left ([place]) 07/01/2015   Peripheral vascular disease ([place]) 07/01/2015   Degeneration of lumbar intervertebral disc 06/14/2015   History of right MCA stroke 07/21/2014   OAB (overactive bladder) 01/19/2014   Stroke ([place]) 01/01/2014   Exogenous obesity 11/08/2013   Hypertonicity of bladder 11/08/2013   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   Microscopic hematuria 11/08/2013   Tobacco use 11/08/2013   Nocturia 11/08/2013   Ventral hernia 11/08/2013   Carotid arterial disease ([place]) 05/23/2012   Hypertension    OSA (obstructive sleep apnea)    Chronic back pain    BPH with obstruction/lower urinary tract symptoms    OA (osteoarthritis) of knee 04/01/2012   Depression 03/17/2010   DIVERTICULOSIS, COLON 03/17/2010   COLONIC POLYPS, ADENOMATOUS, HX OF 03/17/2010    Patient's Medications  New Prescriptions   No medications on file  Previous Medications   ACETAMINOPHEN (TYLENOL) 650 MG CR TABLET    Take 650 mg by mouth at bedtime as needed for pain.   ALBUTEROL (VENTOLIN HFA) 108 (90 BASE) MCG/ACT INHALER    Inhale 2 puffs into the lungs every 6 (six) hours as needed for wheezing or shortness of breath.   AMLODIPINE (NORVASC) 10 MG TABLET    Take 1 tablet (10 mg total) by mouth daily.   ASPIRIN 81 MG EC  TABLET    Take 1 tablet (81 mg total) by mouth daily. Swallow whole.   ATORVASTATIN (LIPITOR) 40 MG TABLET    Take 1 tablet (40 mg total) by mouth daily.   FINASTERIDE (PROSCAR) 5 MG TABLET    Take 1 tablet (5 mg total) by mouth daily.   FLUTICASONE FUROATE-VILANTEROL (BREO ELLIPTA) 200-25 MCG/ACT AEPB    Inhale 1 puff into the lungs daily.   GABAPENTIN (NEURONTIN) 300 MG CAPSULE    Take 1 capsule (300 mg total) by mouth 2 (two) times daily.    METOPROLOL TARTRATE (LOPRESSOR) 25 MG TABLET    Take 1 tablet (25 mg total) by mouth 2 (two) times daily.   NICOTINE (NICODERM CQ - DOSED IN MG/24 HOURS) 21 MG/24HR PATCH    Place 1 patch (21 mg total) onto the skin daily.   PENICILLIN G IVPB    Inject 24 Million Units into the vein daily. As a continuous infusion. Indication:  Gemella bacteremia/epidural abscess/possible endocarditis First Dose: Yes Last Day of Therapy:  01/30/22 Labs - Once weekly:  CBC/D and BMP, Labs - Every other week:  ESR and CRP Method of administration: Elastomeric (Continuous infusion) Method of administration may be changed at the discretion of home infusion pharmacist based upon assessment of the patient and/or caregiver's ability to self-administer the medication ordered.   POLYETHYLENE GLYCOL (MIRALAX / GLYCOLAX) 17 G PACKET    Take 17 g by mouth daily.   SENNA-DOCUSATE (SENOKOT-S) 8.6-50 MG TABLET    Take 1 tablet by mouth 2 (two) times daily.   SERTRALINE (ZOLOFT) 50 MG TABLET    Take 1 tablet (50 mg total) by mouth daily.   TAMSULOSIN (FLOMAX) 0.4 MG CAPS CAPSULE    TAKE 1 CAPSULE BY MOUTH ONCE DAILY AFTER SUPPER  Modified Medications   No medications on file  Discontinued Medications   No medications on file    Subjective: 76 year old male with past medical history of hypertension, obesity, COPD, OSA, CAD, TAVR on 06/07/2021 due to aortic stenosis, tuberculosis, AAA status post repair in January 2019, chronic back pain presents for hospital follow-up.  Patient was [facility]/28 - 7/14 after presenting with worsening chronic back pain with associated cough, chills, wheezing.  He had  temp of 100.4 with WBC 12.2 on admission.  Blood cultures grew Gemella sanguinous.  There was concern for endocarditis as patient has a TAVR, suspected 2/2 odontogenic source due to poor dentition.  TTE showed possible aortic valve vegetation, TEE showed concern for aortic valve vegetation as well.  CTA chest done showed  no abscess.  MRI lumbar spine showed L5-S1 epidural fluid collection.  Patient complained of left shoulder pain, Ortho engaged patient underwent aspiration 7/20 WBC of 27 K and negative cultures on several days of antibiotics.  Seen by cardiology who did not think  findings are reflective of endocarditis and recommended antibiotics for management of epidural abscess.  Seen by neurosurgery Dr. Ronnald Ramp held off decompression at the time, recommended observation antibiotics. Seen by dentistry and recommended outpatient follow-up as the patient needed radiographs at follow-up assessment. Today 7/31: Patient presents with wife.  They report they have not seen dentistry.  He denies fevers and chills.  He reports 100% to antibiotics at skilled facility. Review of Systems: Review of Systems  All other systems reviewed and are negative.   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  Abnormal gait 07/31/2019   Abscess of left leg    Acute on chronic diastolic heart failure ([place])    Acute respiratory failure with hypoxia and hypercapnia ([place]) 05/27/2021   HC03   06/14/21    =  31  - 06/23/2021   Walked 250 ft  at a slow pace with a cane. Complained of back and left knee hurting with lowest sat 89% so d/c'd 02    Adenomatous colon polyp    Adrenal mass ([place])    2 benign appearing left adrenal adenomas noted on 01/13/15 CT   Allergy    seasonal   Anxiety 04/08/2020   Arthritis    Blister of multiple sites of lower extremity 01/10/2018   Body mass index (BMI) 34.0-34.9, adult 04/07/2020   BPH with obstruction/lower urinary tract symptoms    Overview:  Probable based on symptoms   CAD (coronary artery disease) 02/01/2018   Carotid arterial disease ([place]) 05/23/2012   Carotid artery occlusion    Chronic back pain    Cigarette smoker 09/05/2018   COLONIC POLYPS, ADENOMATOUS, HX OF 03/17/2010   Qualifier: Diagnosis of  By: Harlon Ditty  CMA (AAMA), [place]     COPD GOLD II 04/07/2019   Quit smoking 03/2019 - PFT's  04/07/2019  FEV1 1.58 (57 % ) ratio 0.54  p 0 % improvement from saba p nothing prior to study with DLCO  15.22 (66%) corrects to 3.05 (74%)  for alv volume and FV curve concave classically     Degeneration of cervical intervertebral disc    Degeneration of lumbar intervertebral disc 06/14/2015   Depression 03/17/2010   Qualifier: Diagnosis of  By: Nelson-Smith CMA (AAMA), Dottie     Diverticulosis    DIVERTICULOSIS, COLON 03/17/2010   Qualifier: Diagnosis of  By: Harlon Ditty CMA (AAMA), Dottie     Dyspnea    Exogenous obesity 11/08/2013   Overview:  With a nine pound weight gain since his last visit   Heart murmur    History of back surgery    Rods and Screws in back   History of craniotomy 01/11/2018   History of left knee replacement    History of right MCA stroke 07/21/2014   Hyperlipidemia LDL goal <70 01/19/2018   Hypertension    Hypertonicity of bladder 11/08/2013   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   Iliac artery aneurysm, left ([place]) 07/01/2015   Overview:  Follow-up to Dr. Oneida Alar and 06/10/2015 last visit   Impairment of balance 10/27/2021   Inflammation of sacroiliac joint ([place]) 05/06/2019   Lower urinary tract symptoms (LUTS) 08/06/2017   Meningioma ([place])    Microscopic hematuria 11/08/2013   Mitral regurgitation 06/02/2021   Muscle weakness 10/27/2021   Neck pain 03/25/2019   Nocturia    OA (osteoarthritis) of knee 04/01/2012   OAB (overactive bladder) 01/19/2014   OSA (obstructive sleep apnea)    Bipap per Chodri since ? 2018 - Download 09/05/2018 used > 4 h x > 92% of days and avg use 8 h 64min with AHI 3.1 @ 6 ipap and 10 epap    Osteoarthritis of right glenohumeral joint 08/14/2017   Other specified postprocedural states 09/25/2017   Peripheral vascular disease ([place])    Pre-procedure lab exam 01/19/2018   Preop cardiovascular exam 10/24/2018   Ringing in ear    ([place])   S/P lumbar spinal fusion 07/07/2015   S/P  TAVR (transcatheter aortic valve replacement) 06/07/2021   s/p TAVR with a 29 mm Edwards S3U via the TF approach by  Dr. Angelena Form & Dr. Cyndia Bent   Severe aortic stenosis 02/01/2018   ECHO 01/30/18 - Left ventricle: The cavity size was normal. There was moderate   focal basal hypertrophy of the septum. Systolic function was   vigorous. The estimated ejection fraction was in the range of 65%   to 70%. Wall motion was normal; there were no regional wall   motion abnormalities. Doppler parameters are consistent with   abnormal left ventricular relaxation (grade 1 diastolic   dysfun   Status post AAA (abdominal aortic aneurysm) repair 02/01/2018   Stroke ([place]) 2013   tia no residual deficit from   Tobacco use 11/08/2013   Ventral hernia 11/08/2013   Overview:  S/p repair with alloderm mesh and s/p SB resection    Social History   Tobacco Use   Smoking status: Former    Packs/day: 0.75    Years: 40.00    Total pack years: 30.00    Types: Cigarettes    Quit date: 05/05/2021    Years since quitting: 0.6   Smokeless tobacco: Never   Tobacco comments:    1/2 pack a day if that   Vaping Use   Vaping Use: Never used  Substance Use Topics   Alcohol use: Yes    Comment: rare   Drug use: No    Family History  Problem Relation Age of Onset   Heart disease Mother        Onset ~68 y/o   Hypertension Mother        Deceased from old age at 41   Hyperlipidemia Mother    Arthritis Mother    Diabetes Father        Deceased from old age at 73   Heart attack Father    Heart disease Father        CABG at age 57   Dementia Father 13   Arthritis Father    Prostate cancer Maternal Grandfather    Colon cancer Neg Hx    Esophageal cancer Neg Hx    Rectal cancer Neg Hx    Stomach cancer Neg Hx     Allergies  Allergen Reactions   Cefazolin Rash and Other (See Comments)    The patient had surgery and was given cefazolin intraop. ~ 10 days later he developed a rash confirmed by biopsy to be consistent w/  drug eruption. We cannot know for sure, but this is the most likely agent.      Health Maintenance  Topic Date Due   Zoster Vaccines- Shingrix (1 of 2) Never done   Pneumonia Vaccine 63+ Years old (2 - PCV) 05/28/2012   TETANUS/TDAP  11/03/2017   COVID-19 Vaccine (3 - Pfizer series) 03/31/2020   COLONOSCOPY (Pts 45-17yrs Insurance coverage will need to be confirmed)  09/18/2021   INFLUENZA VACCINE  01/03/2022   Hepatitis C Screening  Completed   HPV VACCINES  Aged Out    Objective:  There were no vitals filed for this visit. There is no height or weight on file to calculate BMI.  Physical Exam Constitutional:      General: He is not in acute distress.    Appearance: He is normal weight. He is not toxic-appearing.  HENT:     Head: Normocephalic and atraumatic.     Right Ear: External ear normal.     Left Ear: External ear normal.     Nose: No congestion or rhinorrhea.     Mouth/Throat:     Mouth: Mucous membranes  are moist.     Pharynx: Oropharynx is clear.  Eyes:     Extraocular Movements: Extraocular movements intact.     Conjunctiva/sclera: Conjunctivae normal.     Pupils: Pupils are equal, round, and reactive to light.  Cardiovascular:     Rate and Rhythm: Normal rate and regular rhythm.     Heart sounds: No murmur heard.    No friction rub. No gallop.     Comments: LUE PICC Pulmonary:     Effort: Pulmonary effort is normal.     Breath sounds: Normal breath sounds.  Abdominal:     General: Abdomen is flat. Bowel sounds are normal.     Palpations: Abdomen is soft.  Musculoskeletal:        General: No swelling. Normal range of motion.     Cervical back: Normal range of motion and neck supple.  Skin:    General: Skin is warm and dry.  Neurological:     General: No focal deficit present.     Mental Status: He is oriented to person, place, and time.  Psychiatric:        Mood and Affect: Mood normal.     Lab Results Lab Results  Component Value Date   WBC  7.4 12/14/2021   HGB 11.3 (L) 12/16/2021   HCT 32.9 (L) 12/16/2021   MCV 93.3 12/14/2021   PLT 207 12/14/2021    Lab Results  Component Value Date   CREATININE 0.73 12/14/2021   BUN 18 12/14/2021   NA 134 (L) 12/14/2021   K 4.6 12/14/2021   CL 100 12/14/2021   CO2 26 12/14/2021    Lab Results  Component Value Date   ALT 34 12/13/2021   AST 24 12/13/2021   ALKPHOS 74 12/13/2021   BILITOT 0.6 12/13/2021    Lab Results  Component Value Date   CHOL 159 05/31/2021   HDL 58 05/31/2021   LDLCALC 88 05/31/2021   TRIG 64 05/31/2021   CHOLHDL 2.7 05/31/2021   Lab Results  Component Value Date   LABRPR Non Reactive 09/25/2019   No results found for: "HIV1RNAQUANT", "HIV1RNAVL", "CD4TABS"  #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  -TTE showed shaggy mobile echodensity on ventricular surface of TAVR findings consistent with vegetation on the prosthesis on 7/4 -TEE on 7/6 showed highly mobile mass on the base of the left coronary pelvis on TAVR suspicion vegetation.  Consider CTA. -CTA on 7/11 showed no abscess, mild aortic valve. - Cardiology Dr. Jenkins Rouge consulted and reviewed TEE done on 7/6 and CTA and did not appreciate vegetation/abscess.  Noted the duration of antibiotics should be based on epidural abscess and septic arthritis.  Recommended dental consult for further dose of infection source. -Labs 7/25 wbc 5.9, Scr 0.64, 7/18 ESR 28,crp1.14 -Source likely dental as gemella is found in oral cavity. Dentistry recommend outpatient follow-up as they were not able to do radiographs.  Plan: - Continue penicillin 24 MU every 24 hours continuous infusion x8 weeks EOT 8/28.  Patient completed gentamicin x2 weeks for synergy on 7/16. -MRI lumbar spine ordered by [facility] for 8/16  -Sent message to SNF to please send weekly labs: cbc, cmp,  esr, crp -Counseled to follow-up with [facility] and dentistry(has not seen dentistry outpatient) -Follow-up with Dr. Juleen China on 8/25 to follow-up on MRI. -Given his presentation of fever, bacteremia and TEE findings of vegetation  would  consider repeating TTE  towards end of therapy to determine need for suppressive antibiotics(amoxicillin). He has an aopt with Cardiology on 8/17, will message cardiology for possible TTE.   #Epidural abscess L5-S1 -Epidural abcsess present on MRI on 7/5 and 7/8 -Neurosurgery, Dr. Sherley Bounds was consulted, recommended to observation on antibiotics.  Noted that intervention may involve fusion with instrumentation/defer decompression at that time. -Antibiotics and MRI spine as above  #Septic arthritis of left shoulder status post aspiration - Synovial cell count 27.5 K WBC, 92% neutrophils, 4% Lymphs, 4% monocytes, Cx NG -No shoulder issues/pain at this visit  I spent more than 67 minutes for this patient encounter including reviewing data/chart, and coordinating care and >50% direct face to face time providing counseling/discussing diagnostics/treatment plan with patient  Laurice Record, [facility] 01/02/2022, 9:24 AM

## 2022-01-02 NOTE — Addendum Note (Signed)
Addended byLaurice Record on: 01/02/2022 06:13 PM   Modules accepted: Level of Service

## 2022-01-03 ENCOUNTER — Telehealth: Payer: Self-pay

## 2022-01-03 ENCOUNTER — Telehealth: Payer: Self-pay | Admitting: Cardiology

## 2022-01-03 DIAGNOSIS — I38 Endocarditis, valve unspecified: Secondary | ICD-10-CM

## 2022-01-03 DIAGNOSIS — Z952 Presence of prosthetic heart valve: Secondary | ICD-10-CM

## 2022-01-03 NOTE — Telephone Encounter (Signed)
Spoke to [place] at [facility] regarding patients appointments. Heather wanted to know who ordered the patient's ct scan I informed her that patient's CT scan was ordered by heartcare and she stated that she will contact the facility to let them know that the patient's wife canceled his appointment.

## 2022-01-03 NOTE — Telephone Encounter (Signed)
Received voicemail from [place] at [facility] requesting callback regarding questions about upcoming appointments. Returned Heather's call, no answer. Left voicemail requesting callback.    Heather [phone] ext. [place]

## 2022-01-03 NOTE — Telephone Encounter (Signed)
Pt needs an echo per Laurice Record, MD.

## 2022-01-03 NOTE — Telephone Encounter (Signed)
Caller reported that patient is already having labs drawn weekly (CBC, CMT, ESR and CRP) and wanted to request labs needed by cardiologist be added to patient's current lab draw.  Caller canceled 8/8 lab appointment as patient is in nursing home and unable to make the appointment.  Caller stated that lab results will be faxed to cardiologist.

## 2022-01-03 NOTE — Telephone Encounter (Signed)
Left a message for Heather. Called and spoke with pt. Pt will have his wife call me back.

## 2022-01-03 NOTE — Telephone Encounter (Signed)
Follow Up:      Kyle Lewis is calling to see what lab does patient need drawn by them? She also wanted you to know that the wife is trying to cx patient CT and his follow up appointment.. They are trying to convince her not to cancel the appointments.. She says the patient really need to have these appointments.

## 2022-01-04 ENCOUNTER — Encounter: Payer: Medicare Other | Admitting: Family

## 2022-01-04 ENCOUNTER — Encounter: Payer: Medicare Other | Admitting: Family Medicine

## 2022-01-04 NOTE — Telephone Encounter (Signed)
Left a VM for Heather to callback.

## 2022-01-04 NOTE — Telephone Encounter (Signed)
Kyle Lewis is aware that the pt needs a bmp for his CT scan.

## 2022-01-10 ENCOUNTER — Other Ambulatory Visit: Payer: Medicare Other

## 2022-01-17 ENCOUNTER — Ambulatory Visit (HOSPITAL_COMMUNITY): Payer: Medicare Other

## 2022-01-18 ENCOUNTER — Other Ambulatory Visit: Payer: Medicare Other

## 2022-01-19 ENCOUNTER — Other Ambulatory Visit: Payer: Medicare Other

## 2022-01-19 ENCOUNTER — Ambulatory Visit: Payer: Medicare Other | Admitting: Cardiology

## 2022-01-23 ENCOUNTER — Ambulatory Visit (HOSPITAL_COMMUNITY)
Admission: RE | Admit: 2022-01-23 | Discharge: 2022-01-23 | Disposition: A | Discharge status: Home / Self Care | Payer: Medicare Other | Source: Ambulatory Visit | Attending: Physician Assistant | Admitting: Physician Assistant

## 2022-01-23 DIAGNOSIS — N2889 Other specified disorders of kidney and ureter: Secondary | ICD-10-CM | POA: Diagnosis present

## 2022-01-23 MED ORDER — IOHEXOL 350 MG/ML SOLN
100.0000 mL | Freq: Once | INTRAVENOUS | Status: AC | PRN
  Administered 2022-01-23: 100 mL via INTRAVENOUS

## 2022-01-25 ENCOUNTER — Other Ambulatory Visit: Payer: Medicare Other

## 2022-01-26 ENCOUNTER — Ambulatory Visit (INDEPENDENT_AMBULATORY_CARE_PROVIDER_SITE_OTHER): Payer: Medicare Other | Admitting: Internal Medicine

## 2022-01-26 ENCOUNTER — Encounter: Payer: Self-pay | Admitting: Internal Medicine

## 2022-01-26 ENCOUNTER — Other Ambulatory Visit: Payer: Self-pay

## 2022-01-26 VITALS — BP 130/78 | HR 61 | Temp 98.0°F

## 2022-01-26 DIAGNOSIS — I38 Endocarditis, valve unspecified: Secondary | ICD-10-CM

## 2022-01-26 DIAGNOSIS — T826XXA Infection and inflammatory reaction due to cardiac valve prosthesis, initial encounter: Secondary | ICD-10-CM | POA: Diagnosis not present

## 2022-01-26 DIAGNOSIS — M009 Pyogenic arthritis, unspecified: Secondary | ICD-10-CM

## 2022-01-26 DIAGNOSIS — M00812 Arthritis due to other bacteria, left shoulder: Secondary | ICD-10-CM | POA: Diagnosis not present

## 2022-01-26 DIAGNOSIS — G062 Extradural and subdural abscess, unspecified: Secondary | ICD-10-CM

## 2022-01-26 DIAGNOSIS — I330 Acute and subacute infective endocarditis: Secondary | ICD-10-CM

## 2022-01-26 DIAGNOSIS — K029 Dental caries, unspecified: Secondary | ICD-10-CM | POA: Diagnosis not present

## 2022-01-26 DIAGNOSIS — T826XXD Infection and inflammatory reaction due to cardiac valve prosthesis, subsequent encounter: Secondary | ICD-10-CM | POA: Diagnosis not present

## 2022-01-26 HISTORY — DX: Extradural and subdural abscess, unspecified: G06.2

## 2022-01-26 HISTORY — DX: Pyogenic arthritis, unspecified: M00.9

## 2022-01-26 NOTE — Progress Notes (Signed)
[place] for Infectious Disease  CHIEF COMPLAINT:    Follow up for Gemella disseminated infection  SUBJECTIVE:    Kyle Lewis is a 77 y.o. male with PMHx as below who presents to the clinic for disseminated Gemella infection.   Patient here today for follow up.  Admitted at [facility] from 6/8-7/14/23.  There was concern for endocarditis as patient has a TAVR, suspected 2/2 odontogenic source due to poor dentition.  TTE showed possible aortic valve vegetation, TEE showed concern for aortic valve vegetation as well.  CTA chest done showed no abscess.  MRI lumbar spine showed L5-S1 epidural fluid collection.  Patient complained of left shoulder pain, Ortho engaged patient underwent aspiration 7/20 WBC of 27 K and negative cultures on several days of antibiotics.  Seen by cardiology who did not think  findings were reflective of endocarditis and recommended antibiotics for management of epidural abscess.  Seen by neurosurgery Dr. Ronnald Ramp held off decompression at the time, recommended observation and antibiotics. Seen by dentistry and recommended outpatient follow-up as the patient needed radiographs at follow-up assessment. Had outpatient follow up with Dr Candiss Norse 7/31: Patient presented with wife.  They report they have not seen dentistry.  He denied fevers and chills.  He reports 100% to antibiotics at skilled facility.  Today, reports no acute issues.  No fevers, chills. Tolerating antibiotics.  Shoulder pain is stable.  No worsening.  No worsening back pain.   Planning for IV antibiotics to end 01/30/22.    Please see A&P for the details of today's visit and status of the patient's medical problems.   Patient's Medications  New Prescriptions   No medications on file  Previous Medications   ACETAMINOPHEN (TYLENOL) 650 MG CR TABLET    Take 650 mg by mouth at bedtime as needed for pain.   ALBUTEROL (VENTOLIN HFA) 108 (90 BASE) MCG/ACT INHALER    Inhale 2 puffs into the lungs every 6  (six) hours as needed for wheezing or shortness of breath.   AMLODIPINE (NORVASC) 10 MG TABLET    Take 1 tablet (10 mg total) by mouth daily.   ASPIRIN 81 MG EC TABLET    Take 1 tablet (81 mg total) by mouth daily. Swallow whole.   ATORVASTATIN (LIPITOR) 40 MG TABLET    Take 1 tablet (40 mg total) by mouth daily.   FINASTERIDE (PROSCAR) 5 MG TABLET    Take 1 tablet (5 mg total) by mouth daily.   FLUTICASONE FUROATE-VILANTEROL (BREO ELLIPTA) 200-25 MCG/ACT AEPB    Inhale 1 puff into the lungs daily.   GABAPENTIN (NEURONTIN) 300 MG CAPSULE    Take 1 capsule (300 mg total) by mouth 2 (two) times daily.   METOPROLOL TARTRATE (LOPRESSOR) 25 MG TABLET    Take 1 tablet (25 mg total) by mouth 2 (two) times daily.   NICOTINE (NICODERM CQ - DOSED IN MG/24 HOURS) 21 MG/24HR PATCH    Place 1 patch (21 mg total) onto the skin daily.   PENICILLIN G IVPB    Inject 24 Million Units into the vein daily. As a continuous infusion. Indication:  Gemella bacteremia/epidural abscess/possible endocarditis First Dose: Yes Last Day of Therapy:  01/30/22 Labs - Once weekly:  CBC/D and BMP, Labs - Every other week:  ESR and CRP Method of administration: Elastomeric (Continuous infusion) Method of administration may be changed at the discretion of home infusion pharmacist based upon assessment of the patient and/or caregiver's ability to self-administer  the medication ordered.   POLYETHYLENE GLYCOL (MIRALAX / GLYCOLAX) 17 G PACKET    Take 17 g by mouth daily.   SENNA-DOCUSATE (SENOKOT-S) 8.6-50 MG TABLET    Take 1 tablet by mouth 2 (two) times daily.   SERTRALINE (ZOLOFT) 50 MG TABLET    Take 1 tablet (50 mg total) by mouth daily.   TAMSULOSIN (FLOMAX) 0.4 MG CAPS CAPSULE    TAKE 1 CAPSULE BY MOUTH ONCE DAILY AFTER SUPPER  Modified Medications   No medications on file  Discontinued Medications   No medications on file      Past Medical History:  Diagnosis Date   AAA (abdominal aortic aneurysm) (HCC)    5 cm AAA, 2.7  cm LCIA aneurysm 05/2015   Abnormal gait 07/31/2019   Abscess of left leg    Acute on chronic diastolic heart failure (HCC)    Acute respiratory failure with hypoxia and hypercapnia (HCC) 05/27/2021   HC03   06/14/21    =  31  - 06/23/2021   Walked 250 ft  at a slow pace with a cane. Complained of back and left knee hurting with lowest sat 89% so d/c'd 02    Adenomatous colon polyp    Adrenal mass (HCC)    2 benign appearing left adrenal adenomas noted on 01/13/15 CT   Allergy    seasonal   Anxiety 04/08/2020   Arthritis    Blister of multiple sites of lower extremity 01/10/2018   Body mass index (BMI) 34.0-34.9, adult 04/07/2020   BPH with obstruction/lower urinary tract symptoms    Overview:  Probable based on symptoms   CAD (coronary artery disease) 02/01/2018   Carotid arterial disease (HCC) 05/23/2012   Carotid artery occlusion    Chronic back pain    Cigarette smoker 09/05/2018   COLONIC POLYPS, ADENOMATOUS, HX OF 03/17/2010   Qualifier: Diagnosis of  By: Candice Camp CMA (AAMA), Dottie     COPD GOLD II 04/07/2019   Quit smoking 03/2019 - PFT's  04/07/2019  FEV1 1.58 (57 % ) ratio 0.54  p 0 % improvement from saba p nothing prior to study with DLCO  15.22 (66%) corrects to 3.05 (74%)  for alv volume and FV curve concave classically     Degeneration of cervical intervertebral disc    Degeneration of lumbar intervertebral disc 06/14/2015   Depression 03/17/2010   Qualifier: Diagnosis of  By: Nelson-Smith CMA (AAMA), Dottie     Diverticulosis    DIVERTICULOSIS, COLON 03/17/2010   Qualifier: Diagnosis of  By: Candice Camp CMA (AAMA), Dottie     Dyspnea    Exogenous obesity 11/08/2013   Overview:  With a nine pound weight gain since his last visit   Heart murmur    History of back surgery    Rods and Screws in back   History of craniotomy 01/11/2018   History of left knee replacement    History of right MCA stroke 07/21/2014   Hyperlipidemia LDL goal <70 01/19/2018   Hypertension     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 Darrick Penna and 06/10/2015 last visit   Impairment of balance 10/27/2021   Inflammation of sacroiliac joint (HCC) 05/06/2019   Lower urinary tract symptoms (LUTS) 08/06/2017   Meningioma (HCC)    Microscopic hematuria 11/08/2013   Mitral regurgitation 06/02/2021   Muscle weakness 10/27/2021   Neck pain 03/25/2019   Nocturia  OA (osteoarthritis) of knee 04/01/2012   OAB (overactive bladder) 01/19/2014   OSA (obstructive sleep apnea)    Bipap per Chodri since ? 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 Angelena Form & Dr. Cyndia Bent   Severe aortic stenosis 02/01/2018   ECHO 01/30/18 - Left ventricle: The cavity size was normal. There was moderate   focal basal hypertrophy of the septum. Systolic function was   vigorous. The estimated ejection fraction was in the range of 65%   to 70%. Wall motion was normal; there were no regional wall   motion abnormalities. Doppler parameters are consistent with   abnormal left ventricular relaxation (grade 1 diastolic   dysfun   Status post AAA (abdominal aortic aneurysm) repair 02/01/2018   Stroke ([place]) 2013   tia no residual deficit from   Tobacco use 11/08/2013   Ventral hernia 11/08/2013   Overview:  S/p repair with alloderm mesh and s/p SB resection    Social History   Tobacco Use   Smoking status: Former    Packs/day: 0.75    Years: 40.00    Total pack years:  30.00    Types: Cigarettes    Quit date: 05/05/2021    Years since quitting: 0.7   Smokeless tobacco: Never   Tobacco comments:    1/2 pack a day if that   Vaping Use   Vaping Use: Never used  Substance Use Topics   Alcohol use: Yes    Comment: rare   Drug use: No    Family History  Problem Relation Age of Onset   Heart disease Mother        Onset ~20 y/o   Hypertension Mother        Deceased from old age at 37   Hyperlipidemia Mother    Arthritis Mother    Diabetes Father        Deceased from old age at 68   Heart attack Father    Heart disease Father        CABG at age 49   Dementia Father 45   Arthritis Father    Prostate cancer Maternal Grandfather    Colon cancer Neg Hx    Esophageal cancer Neg Hx    Rectal cancer Neg Hx    Stomach cancer Neg Hx     Allergies  Allergen Reactions   Cefazolin Rash and Other (See Comments)    The patient had surgery and was given cefazolin intraop. ~ 10 days later he developed a rash confirmed by biopsy to be consistent w/ drug eruption. We cannot know for sure, but this is the most likely agent.      Review of Systems  All other systems reviewed and are negative.  Except as noted above.   OBJECTIVE:    Vitals:   01/26/22 0852  BP: 130/78  Pulse: 61  Temp: 98 F (36.7 C)  TempSrc: Temporal  SpO2: 90%   There  is no height or weight on file to calculate BMI.  Physical Exam Constitutional:      Appearance: Normal appearance.  HENT:     Head: Normocephalic and atraumatic.  Eyes:     Extraocular Movements: Extraocular movements intact.     Conjunctiva/sclera: Conjunctivae normal.  Pulmonary:     Effort: Pulmonary effort is normal. No respiratory distress.  Abdominal:     General: There is no distension.     Palpations: Abdomen is soft.  Musculoskeletal:        General: Normal range of motion.     Comments: PICC in place.   Skin:    General: Skin is warm and dry.     Findings: No rash.  Neurological:      General: No focal deficit present.     Mental Status: He is alert and oriented to person, place, and time.  Psychiatric:        Mood and Affect: Mood normal.        Behavior: Behavior normal.      Labs and Microbiology:    Latest Ref Rng & Units 12/16/2021    2:21 PM 12/14/2021    3:24 AM 12/13/2021    4:24 AM  CBC  WBC 4.0 - 10.5 K/uL  7.4  8.4   Hemoglobin 13.0 - 17.0 g/dL 11.3  11.7  11.6   Hematocrit 39.0 - 52.0 % 32.9  34.6  34.0   Platelets 150 - 400 K/uL  207  199       Latest Ref Rng & Units 12/14/2021    3:24 AM 12/13/2021    4:24 AM 12/12/2021    1:41 AM  CMP  Glucose 70 - 99 mg/dL 123  114  165   BUN 8 - 23 mg/dL $Remove'18  17  19   'zHdAemI$ Creatinine 0.61 - 1.24 mg/dL 0.73  0.70  0.83   Sodium 135 - 145 mmol/L 134  132  135   Potassium 3.5 - 5.1 mmol/L 4.6  4.6  4.9   Chloride 98 - 111 mmol/L 100  100  95   CO2 22 - 32 mmol/L $RemoveB'26  26  31   'sOckXUoa$ Calcium 8.9 - 10.3 mg/dL 8.3  8.0  8.4   Total Protein 6.5 - 8.1 g/dL  5.5    Total Bilirubin 0.3 - 1.2 mg/dL  0.6    Alkaline Phos 38 - 126 U/L  74    AST 15 - 41 U/L  24    ALT 0 - 44 U/L  34         ASSESSMENT & PLAN:     #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  -CTA on 7/11 showed no abscess, mild aortic valve. -Cardiology Dr. Jenkins Rouge consulted and reviewed TEE done on 7/6 and CTA and did not appreciate vegetation/abscess.  He noted the duration of antibiotics should be based on epidural abscess and septic arthritis. -Recommended dental consult for further dose of infection source. -Source likely dental as gemella is found in oral cavity. Dentistry recommend outpatient follow-up as they were not able to  do radiographs.   Plan: - Continue penicillin 24 MU every 24 hours continuous infusion x8 weeks through 01/30/22.  Patient also completed gentamicin  x2 weeks for synergy on 7/16. -MRI lumbar spine ordered by [facility] scheduled for 01/30/22  -Counseled to follow-up with NSGY and dentistry -Repeat TTE scheduled for 02/03/22 -Will finish IV antibiotics on 01/30/22 then PICC line can be removed and will transition to oral Amoxicillin 1gm TID x 4 weeks through 02/27/22.  May need long term suppression with $RemoveBeforeDE'500mg'sfLpSTHDtsWCcHq$  TID dose but will do high dosing pending repeat MRI and TTE as noted through 02/27/22.   -Follow up in 4 weeks.   #Epidural abscess L5-S1 -Epidural abcsess present on MRI on 7/5 and 7/8 -Neurosurgery, Dr. Sherley Bounds was consulted, recommended to observation on antibiotics.  Noted that intervention may involve fusion with instrumentation/defer decompression at that time. -Antibiotics and MRI spine as above   #Septic arthritis of left shoulder status post aspiration - Synovial cell count 27.5 K WBC, 92% neutrophils, 4% Lymphs, 4% monocytes, Cx NG -No new shoulder issues at this visit   [facility] for Infectious Disease [place] 01/26/2022, 9:04 AM  I have personally spent 30 minutes involved in face-to-face and non-face-to-face activities for this patient on the day of the visit. Professional time spent includes the following activities: Preparing to see the patient (review of tests), Obtaining and/or reviewing separately obtained history (admission/discharge record), Performing a medically appropriate examination and/or evaluation , Ordering medications/tests/procedures, referring and communicating with other health care professionals, Documenting clinical information in the EMR, Independently interpreting results (not separately reported), Communicating results to the patient/family/caregiver, Counseling and educating the patient/family/caregiver and Care  coordination (not separately reported).

## 2022-01-26 NOTE — Patient Instructions (Signed)
See office note from today

## 2022-01-27 ENCOUNTER — Ambulatory Visit: Payer: Medicare Other | Admitting: Internal Medicine

## 2022-01-27 ENCOUNTER — Other Ambulatory Visit: Payer: Self-pay | Admitting: *Deleted

## 2022-01-27 NOTE — Patient Outreach (Signed)
Per [facility] Mr. Sherrard admitted to [facility] on 12/16/21. Screening for potential [facility] care coordination/care management needs as a benefit of insurance plan and Primary Care Provider.   Secure communication sent to SNF social worker to make aware writer is following for potential [facility] needs. Will plan outreach to spouse to discuss transition and potential [facility] care coordination services.   Marthenia Rolling, MSN, RN,BSN [place] Acute Care Coordinator [phone] [facility]) [phone]  (Toll free office)

## 2022-01-30 ENCOUNTER — Ambulatory Visit
Admission: RE | Admit: 2022-01-30 | Discharge: 2022-01-30 | Disposition: A | Discharge status: Home / Self Care | Payer: Medicare Other | Source: Ambulatory Visit | Attending: Neurological Surgery | Admitting: Neurological Surgery

## 2022-01-30 ENCOUNTER — Ambulatory Visit: Payer: Medicare Other | Admitting: Cardiology

## 2022-01-30 DIAGNOSIS — M5416 Radiculopathy, lumbar region: Secondary | ICD-10-CM

## 2022-01-30 MED ORDER — GADOBENATE DIMEGLUMINE 529 MG/ML IV SOLN
20.0000 mL | Freq: Once | INTRAVENOUS | Status: AC | PRN
  Administered 2022-01-30: 20 mL via INTRAVENOUS

## 2022-02-03 ENCOUNTER — Ambulatory Visit: Payer: Medicare Other | Attending: Cardiology

## 2022-02-03 DIAGNOSIS — Z952 Presence of prosthetic heart valve: Secondary | ICD-10-CM | POA: Insufficient documentation

## 2022-02-03 DIAGNOSIS — I38 Endocarditis, valve unspecified: Secondary | ICD-10-CM | POA: Diagnosis present

## 2022-02-03 LAB — ECHOCARDIOGRAM COMPLETE
AR max vel: 1.55 cm2
AV Area VTI: 1.76 cm2
AV Area mean vel: 1.56 cm2
AV Mean grad: 15.3 mmHg
AV Peak grad: 27.5 mmHg
Ao pk vel: 2.62 m/s
Area-P 1/2: 2.11 cm2
S' Lateral: 3.4 cm

## 2022-02-07 ENCOUNTER — Telehealth: Payer: Self-pay | Admitting: Cardiology

## 2022-02-07 NOTE — Telephone Encounter (Signed)
Wife called to follow-up on patient's test results and stated she can be reached on her mobile.

## 2022-02-07 NOTE — Telephone Encounter (Signed)
Patient's wife (per DPR) informed of the results of his echocardiogram.

## 2022-02-13 ENCOUNTER — Telehealth: Payer: Self-pay | Admitting: *Deleted

## 2022-02-13 ENCOUNTER — Encounter: Payer: Self-pay | Admitting: *Deleted

## 2022-02-13 ENCOUNTER — Other Ambulatory Visit: Payer: Self-pay | Admitting: *Deleted

## 2022-02-13 NOTE — Patient Outreach (Signed)
  Care Coordination TOC Note Transition Care Management Follow-up Telephone Call Date of discharge and from where: [facility] on 02/11/22 How have you been since you were released from the hospital? Still weak and not getting any stronger Any questions or concerns? Yes. Needs aid services to help with getting in and out of the shower. [facility] [phone]) whom they were referred to doesn't offer aid services. Provided information on [employer] and provided telephone number [phone]) for them to discuss the best insurance coverage for them.   Items Reviewed: Did the pt receive and understand the discharge instructions provided? Yes  Medications obtained and verified? Yes  Other? No  Any new allergies since your discharge? No  Dietary orders reviewed? Yes Do you have support at home? Yes   Home Care and Equipment/Supplies: Were home health services ordered? yes If so, what is the name of the agency? [facility]  Has the agency set up a time to come to the patient's home? Not yet. Patient's wife will call them back after his provider visits tomorrow.  Were any new equipment or medical supplies ordered?  No What is the name of the medical supply agency? N/A Were you able to get the supplies/equipment? not applicable Do you have any questions related to the use of the equipment or supplies? No  Functional Questionnaire: (I = Independent and D = Dependent) ADLs: D  Bathing/Dressing- D  Meal Prep- D  Eating- I  Maintaining continence- D  Transferring/Ambulation- D  Managing Meds- D  Follow up appointments reviewed:  PCP Hospital f/u appt confirmed? No . Declined at this time.  [facility] f/u appt confirmed? Yes  Scheduled to see Dr Alinda Money (urology) on 02/14/22 @ 11:00. Are transportation arrangements needed? No  If their condition worsens, is the pt aware to call PCP or go to the Emergency Dept.? Yes Was the patient provided with contact information  for the PCP's office or ED? Yes Was to pt encouraged to call back with questions or concerns? Yes  SDOH assessments and interventions completed:   Yes  Care Coordination Interventions Activated:  Yes   Care Coordination Interventions:  Referred for Care Coordination Services:  RN Care Coordinator    Encounter Outcome:  Pt. Visit Completed    Chong Sicilian, BSN, RN-BC [place] [facility] Pharmacist, community Dial: [phone]

## 2022-02-13 NOTE — Patient Outreach (Signed)
[facility] Post- Acute Care Coordinator follow up. Verified in [facility] Mr. Cavanagh discharged from [place] on 02/11/22. Screening for [facility] care coordination services as benefit of insurance plan and PCP.   Telephone call made to Mrs. Perra. Patient identifiers confirmed. Explained [facility] care coordination services. Mrs. Jaskiewicz expressed concern and frustration that she has to pay out of pocket for services such as caregiver assistance and for equipment insurance will not cover. Such as transport chair. She is on the phone calling about obtaining a transport chair for appointment tomorrow. States Mr. Garske already has hospital bed and wheelchair.  States she is paying out of pocket currently for care giver assistance.   Mrs. Prokop states Mr. Hechler has appointment scheduled for Mr. Colden's back issues. Mrs. Crescenzo states Mr. Victorian can barely walk. Not clear on home health arrangements. Writer to follow up with SNF SW to inquire.   Mrs. Fernandez declined [facility] care coordination services at this current time.    Marthenia Rolling, MSN, RN,BSN [place] Acute Care Coordinator [phone] [facility]) [phone]  (Toll free office)

## 2022-02-13 NOTE — Patient Outreach (Signed)
[facility] Post- Acute Care Coordinator follow up.   [facility] SW reports [facility] was arranged for PT/OT/RN/SW.   Marthenia Rolling, MSN, RN,BSN [place] Acute Care Coordinator [phone] [facility]) [phone]  (Toll free office)

## 2022-02-15 ENCOUNTER — Ambulatory Visit: Payer: Self-pay

## 2022-02-15 ENCOUNTER — Other Ambulatory Visit: Payer: Self-pay | Admitting: Neurological Surgery

## 2022-02-15 DIAGNOSIS — M4647 Discitis, unspecified, lumbosacral region: Secondary | ICD-10-CM

## 2022-02-15 NOTE — Patient Outreach (Addendum)
  Care Coordination   Initial Visit Note   02/15/2022 Name: Kyle Lewis MRN: [id number] DOB: 1946/04/11  Kyle Lewis is a 76 y.o. year old male who sees [name], MD for primary care. I spoke with wife Kyle Lewis by phone today.  What matters to the patients health and wellness today?  Patient's wife expressed needing respite care and help with bathing due to having limitations of her own.     Goals Addressed             This Visit's Progress    We need help with bathing and respite care       Care Coordination Interventions: Determined wife Kyle Lewis needs assistance with patient's personal hygiene need and respite care due to having self limitations secondary to having her own health issues Determined patient is also in need of a lightweight manual wheelchair and raised toilet seat, Medicare will not cover due to using benefits for other DME Active listening / Reflection utilized  Emotional Support Provided Problem Solving [place] strategies reviewed Caregiver stress acknowledged  Made referral to [place] Provided appropriate websites to check for DME loaner equipment; [employer]         SDOH assessments and interventions completed:  No     Care Coordination Interventions Activated:  Yes  Care Coordination Interventions:  Yes, provided   Follow up plan: Referral made to [place] Follow up call scheduled for 03/01/22 '@09'$ :30 AM     Encounter Outcome:  Pt. Visit Completed

## 2022-02-15 NOTE — Patient Instructions (Signed)
Visit Information  Thank you for taking time to visit with me today. Please don't hesitate to contact me if I can be of assistance to you.   Following are the goals we discussed today:   Goals Addressed             This Visit's Progress    We need help with bathing and respite care       Care Coordination Interventions: Determined wife [name] needs assistance with patient's personal hygiene need and respite care due to having self limitations secondary to having her own health issues Determined patient is also in need of a lightweight manual wheelchair and raised toilet seat, Medicare will not cover due to using benefits for other DME Active listening / Reflection utilized  Emotional Support Provided Problem Solving [place] strategies reviewed Caregiver stress acknowledged  Made referral to [place] Provided appropriate websites to check for DME loaner equipment; [employer]         Our next appointment is by telephone on 03/01/22 at 09:30 AM  Please call the care guide team at [phone] if you need to cancel or reschedule your appointment.   If you are experiencing a Mental Health or [place] or need someone to talk to, please call 1-800-273-TALK (toll free, 24 hour hotline)  The patient verbalized understanding of instructions, educational materials, and care plan provided today and agreed to receive a mailed copy of patient instructions, educational materials, and care plan.   Barb Merino, RN, BSN, CCM Care Management Coordinator [facility] Care Management Direct Phone: [phone]

## 2022-02-16 ENCOUNTER — Telehealth: Payer: Self-pay

## 2022-02-16 NOTE — Telephone Encounter (Signed)
Kyle Lewis with Kyle Lewis called stating that they are closing out referral from [facility] for home health. They have attempted to start care with the patient and they keep refusing to start. They want to start next week,but orders will be expired by that time. She states if home health needed again just send another referral.  Message routed to Dr. [name] [facility])

## 2022-02-17 ENCOUNTER — Ambulatory Visit: Payer: Self-pay

## 2022-02-17 NOTE — Patient Outreach (Signed)
  Care Coordination   Follow Up Visit Note   02/17/2022 Name: RANDELL TEARE MRN: [id number] DOB: 09/12/1945  KEYONTAY STOLZ is a 76 y.o. year old male who sees [name], MD for primary care. I  spoke with patients wqife Rhonda by phone  What matters to the patients health and wellness today?  We need help in the home    Goals Addressed             This Visit's Progress    Care Coordination Activities       Care Coordination Interventions: Collaboration with [place] who requests SW assist with care coordination needs Contacted patients spouse [name] to discuss patient is a high fall risk and needs assistance with ambulation, transfer, bathing, and toileting. Patient has a hospital bed in the home and walker Determined the patient would benefit from a raised toilet seat - contacted patients primary care provider to request orders for a 3 in 1 commode Discussed plans for SW to contact [place] and the [facility] for assistance with home care as well as a transfer chair due to Medicare not covering the cost Voice message left for  Estill Bamberg with [place] with [employer]aelene Bott, community engagement and Engineer, site to request assistance with identifying resources to sit with the patient while wife is not home. Also requested feedback on resources for a transfer chair         SDOH assessments and interventions completed:  No     Care Coordination Interventions Activated:  Yes  Care Coordination Interventions:  Yes, provided   Follow up plan: Follow up call scheduled for 9/20    Encounter Outcome:  Pt. Visit Completed   Daneen Schick, Arita Miss, CDP Social Worker, Certified Dementia Practitioner [place] [facility]  Care Coordination [phone]

## 2022-02-17 NOTE — Patient Instructions (Signed)
Visit Information  Thank you for taking time to visit with me today. Please don't hesitate to contact me if I can be of assistance to you.   Following are the goals we discussed today:   Goals Addressed             This Visit's Progress    Care Coordination Activities       Care Coordination Interventions: Collaboration with [place] who requests SW assist with care coordination needs Contacted patients spouse [name] to discuss patient is a high fall risk and needs assistance with ambulation, transfer, bathing, and toileting. Patient has a hospital bed in the home and walker Determined the patient would benefit from a raised toilet seat - contacted patients primary care provider to request orders for a 3 in 1 commode Discussed plans for SW to contact [place] and the [facility] for assistance with home care as well as a transfer chair due to Medicare not covering the cost Voice message left for  Estill Bamberg with [place] with [employer]ael[employer] Bott, community engagement and Engineer, site to request assistance with identifying resources to sit with the patient while wife is not home. Also requested feedback on resources for a transfer chair         Our next appointment is by telephone on 9/20  Please call the care guide team at [phone] if you need to cancel or reschedule your appointment.   If you are experiencing a Mental Health or [place] or need someone to talk to, please call 1-800-273-TALK (toll free, 24 hour hotline)  Patient verbalizes understanding of instructions and care plan provided today and agrees to view in [place]. Active MyChart status and patient understanding of how to access instructions and care plan via MyChart confirmed with patient.     Telephone follow up appointment with care management team member scheduled for:9/20

## 2022-02-20 ENCOUNTER — Encounter: Payer: Self-pay | Admitting: Family

## 2022-02-20 ENCOUNTER — Telehealth (INDEPENDENT_AMBULATORY_CARE_PROVIDER_SITE_OTHER): Payer: Medicare Other | Admitting: Family

## 2022-02-20 ENCOUNTER — Other Ambulatory Visit: Payer: Self-pay | Admitting: Family Medicine

## 2022-02-20 DIAGNOSIS — M25512 Pain in left shoulder: Secondary | ICD-10-CM | POA: Diagnosis not present

## 2022-02-20 DIAGNOSIS — G8929 Other chronic pain: Secondary | ICD-10-CM | POA: Diagnosis not present

## 2022-02-20 DIAGNOSIS — J449 Chronic obstructive pulmonary disease, unspecified: Secondary | ICD-10-CM

## 2022-02-20 DIAGNOSIS — M5442 Lumbago with sciatica, left side: Secondary | ICD-10-CM

## 2022-02-20 MED ORDER — OXYCODONE HCL 5 MG PO TABS
5.0000 mg | ORAL_TABLET | Freq: Four times a day (QID) | ORAL | 0 refills | Status: DC | PRN
Start: 2022-02-20 — End: 2022-04-03

## 2022-02-20 NOTE — Progress Notes (Signed)
This service is provided via telemedicine  No vital signs collected/recorded due to the encounter was a telemedicine visit.   Location of patient (ex: home, work):  Home.  Patient consents to a telephone visit:  Yes  Location of the provider (ex: office, home):  [employer].  Name of any referring provider:  [name], MD   Names of all persons participating in the telemedicine service and their role in the encounter:  Patient, Heriberto Antigua, [place], [place], Webb Silversmith, NP.    Time spent on call: 8 minutes spent on the phone with Medical Assistant.      Provider: Owens Hara FNP-C  [name], MD  Patient Care Team: [name], MD as PCP - General (Family Medicine) Revankar, Reita Cliche, MD as PCP - Cardiology (Cardiology) Druscilla Brownie, MD as Consulting Physician (Dermatology) Garvin Fila, MD as Consulting Physician (Neurology)  Extended Emergency Contact Information Primary Emergency Contact: Viernes,Rhonda Address: [address]          [place], [place] [address] Johnnette Litter of [place] Phone: [phone] Mobile Phone: [phone] Relation: Spouse Secondary Emergency Contact: [place] Phone: [phone] Relation: Son  Code Status:  Full Code  Goals of care: Advanced Directive information    02/20/2022    1:30 PM  Advanced Directives  Does Patient Have a Medical Advance Directive? No  Does patient want to make changes to medical advance directive? No - Patient declined     Chief Complaint  Patient presents with   Acute Visit    Patient wife is requesting refill on Oxycodone '5mg'$ . She states that hospital gave patient medication and it was discontinued by another provider. Patient complains that Oxycodone wasn't working for pain. Moderate Fall Risk for patient.     HPI:  Pt is a 76 y.o. male seen today for an acute visit for evaluation of left shoulder and left hip pain.He was given oxycodone 5 mg tablet  every 6 hrs in the ED but requires refill.Rates pain 8/10 on scale.Has Follow up appointment with Orthopedic for possible surgery.    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   Abnormal gait 07/31/2019   Abscess of left leg    Acute on chronic diastolic heart failure (HCC)    Acute respiratory failure with hypoxia and hypercapnia ([place]) 05/27/2021   HC03   06/14/21    =  31  - 06/23/2021   Walked 250 ft  at a slow pace with a cane. Complained of back and left knee hurting with lowest sat 89% so d/c'd 02    Adenomatous colon polyp    Adrenal mass ([place])    2 benign appearing left adrenal adenomas noted on 01/13/15 CT   Allergy    seasonal   Anxiety 04/08/2020   Arthritis    Blister of multiple sites of lower extremity 01/10/2018   Body mass index (BMI) 34.0-34.9, adult 04/07/2020   BPH with obstruction/lower urinary tract symptoms    Overview:  Probable based on symptoms   CAD (coronary artery disease) 02/01/2018   Carotid arterial disease ([place]) 05/23/2012   Carotid artery occlusion    Chronic back pain    Cigarette smoker 09/05/2018   COLONIC POLYPS, ADENOMATOUS, HX OF 03/17/2010   Qualifier: Diagnosis of  By: Nelson-Smith CMA (AAMA), Dottie     COPD GOLD II 04/07/2019   Quit smoking 03/2019 - PFT's  04/07/2019  FEV1 1.58 (57 % ) ratio 0.54  p 0 % improvement from saba p nothing prior to study with DLCO  15.22 (66%) corrects to 3.05 (74%)  for alv volume and FV curve concave classically     Degeneration of cervical intervertebral disc    Degeneration of lumbar intervertebral disc 06/14/2015   Depression 03/17/2010   Qualifier: Diagnosis of  By: Nelson-Smith CMA (AAMA), Dottie     Diverticulosis    DIVERTICULOSIS, COLON 03/17/2010   Qualifier: Diagnosis of  By: Harlon Ditty CMA (AAMA), Dottie     Dyspnea    Exogenous obesity 11/08/2013   Overview:  With a nine pound weight gain since his last visit   Heart murmur     History of back surgery    Rods and Screws in back   History of craniotomy 01/11/2018   History of left knee replacement    History of right MCA stroke 07/21/2014   Hyperlipidemia LDL goal <70 01/19/2018   Hypertension    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 Oneida Alar and 06/10/2015 last visit   Impairment of balance 10/27/2021   Inflammation of sacroiliac joint ([place]) 05/06/2019   Lower urinary tract symptoms (LUTS) 08/06/2017   Meningioma ([place])    Microscopic hematuria 11/08/2013   Mitral regurgitation 06/02/2021   Muscle weakness 10/27/2021   Neck pain 03/25/2019   Nocturia    OA (osteoarthritis) of knee 04/01/2012   OAB (overactive bladder) 01/19/2014   OSA (obstructive sleep apnea)    Bipap per Chodri since ? 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 MAngelena Form& Dr. BCyndia Bent  Severe aortic stenosis 02/01/2018   ECHO 01/30/18 - Left ventricle: The cavity size was normal. There was moderate   focal basal hypertrophy of the septum. Systolic function was   vigorous. The estimated ejection fraction was in the range of 65%   to 70%. Wall motion was normal; there were no regional wall   motion abnormalities. Doppler parameters are consistent with   abnormal left ventricular relaxation (grade 1 diastolic   dysfun   Status post AAA (abdominal aortic aneurysm) repair 02/01/2018   Stroke ([place] 2013   tia no residual deficit from   Tobacco use  11/08/2013   Ventral hernia 11/08/2013   Overview:  S/p repair with alloderm mesh and s/p SB resection   Past Surgical History:  Procedure Laterality Date   ABDOMINAL AORTIC ANEURYSM REPAIR  2019   cAthol as child   BACK SURGERY  2018   Rods and Screws in Back lower back   BRAIN MENINGIOMA EXCISION  1991   menigioma   CARPAL TUNNEL RELEASE     left   EYE SURGERY Bilateral    Cataract   IRRIGATION AND DEBRIDEMENT ABSCESS Left 08/25/2020   Procedure: IRRIGATION AND DEBRIDEMENT ABSCESS  LEFT LEG;  Surgeon: Evelina Bucy, DPM;  Location: [facility];  Service: Podiatry;  Laterality: Left;   JOINT REPLACEMENT Left 04-01-12   Knee   LEFT HEART CATH AND CORONARY ANGIOGRAPHY N/A 09/15/2016   Procedure: Left Heart Cath and Coronary Angiography;  Surgeon: Nelva Bush, MD;  Location: [facility];  Service: Cardiovascular;  Laterality: N/A;   MAXIMUM ACCESS (MAS)POSTERIOR LUMBAR INTERBODY FUSION (PLIF) 1 LEVEL N/A 07/07/2015   Procedure:  POSTERIOR LUMBAR INTERBODY FUSION (PLIF) Lumbar Four-Five with Pedicle Screw Fixation Lumbar Two-Five;Laminectomy Lumbar Two-Five;  Surgeon: Eustace Moore, MD;  Location: [facility];  Service: Neurosurgery;  Laterality: N/A;   POSTERIOR LUMBAR INTERBODY FUSION (PLIF) Lumbar Four-Five with Pedicle Screw Fixation Lumbar Two-Five;Laminectomy Lumbar Two-Five   RIGHT/LEFT HEART CATH AND CORONARY ANGIOGRAPHY N/A 01/24/2018   Procedure: RIGHT/LEFT HEART CATH AND CORONARY ANGIOGRAPHY;  Surgeon: Nelva Bush, MD;  Location: [facility];  Service: Cardiovascular;  Laterality: N/A;   RIGHT/LEFT HEART CATH AND CORONARY ANGIOGRAPHY N/A 05/31/2021   Procedure: RIGHT/LEFT HEART CATH AND CORONARY ANGIOGRAPHY;  Surgeon: Burnell Blanks, MD;  Location: [facility];  Service: Cardiovascular;  Laterality: N/A;   TEE WITHOUT CARDIOVERSION N/A 06/07/2021   Procedure: TRANSESOPHAGEAL ECHOCARDIOGRAM (TEE);  Surgeon:  Burnell Blanks, MD;  Location: [facility];  Service: Open Heart Surgery;  Laterality: N/A;   TEE WITHOUT CARDIOVERSION N/A 12/08/2021   Procedure: TRANSESOPHAGEAL ECHOCARDIOGRAM (TEE);  Surgeon: Berniece Salines, DO;  Location: [facility];  Service: Cardiovascular;  Laterality: N/A;   TONSILLECTOMY  as child   TOTAL KNEE ARTHROPLASTY  04/01/2012   Procedure: TOTAL KNEE ARTHROPLASTY;  Surgeon: Gearlean Alf, MD;  Location: [facility];  Service: Orthopedics;  Laterality: Left;   TRANSCATHETER AORTIC VALVE REPLACEMENT, TRANSFEMORAL Bilateral 06/07/2021   Procedure: TRANSCATHETER AORTIC VALVE REPLACEMENT, RIGHT TRANSFEMORAL;  Surgeon: Burnell Blanks, MD;  Location: [facility];  Service: Open Heart Surgery;  Laterality: Bilateral;   TRANSURETHRAL RESECTION OF PROSTATE     ULTRASOUND GUIDANCE FOR VASCULAR ACCESS Bilateral 06/07/2021   Procedure: ULTRASOUND GUIDANCE FOR VASCULAR ACCESS;  Surgeon: Burnell Blanks, MD;  Location: [facility];  Service: Open Heart Surgery;  Laterality: Bilateral;   UMBILICAL HERNIA REPAIR  2011    Allergies  Allergen Reactions   Cefazolin Rash and Other (See Comments)    The patient had surgery and was given cefazolin intraop. ~ 10 days later he developed a rash confirmed by biopsy to be consistent w/ drug eruption. We cannot know for sure, but this is the most likely agent.      Outpatient Encounter Medications as of 02/20/2022  Medication Sig   amLODipine (NORVASC) 10 MG tablet Take 1 tablet (10 mg total) by mouth daily.   amoxicillin (AMOXIL) 500 MG capsule Take 1,000 mg by mouth 3 (three) times daily.   atorvastatin (LIPITOR) 40 MG tablet Take 1 tablet (40 mg total) by mouth daily.   finasteride (PROSCAR) 5 MG tablet Take 1 tablet (5 mg total) by mouth daily.   gabapentin (NEURONTIN) 300 MG capsule Take 1 capsule (300 mg total) by mouth 2 (two) times daily.   metoprolol tartrate (LOPRESSOR) 25 MG tablet Take 1 tablet (25 mg total) by mouth 2 (two) times daily.    nicotine (NICODERM CQ - DOSED IN MG/24 HOURS) 21 mg/24hr patch Place 1 patch (21 mg total) onto the skin daily.   oxyCODONE (OXY IR/ROXICODONE) 5 MG immediate release tablet Take 5 mg by mouth as needed for  severe pain.   senna-docusate (SENOKOT-S) 8.6-50 MG tablet Take 1 tablet by mouth 2 (two) times daily.   sertraline (ZOLOFT) 50 MG tablet Take 1 tablet (50 mg total) by mouth daily.   tamsulosin (FLOMAX) 0.4 MG CAPS capsule TAKE 1 CAPSULE BY MOUTH ONCE DAILY AFTER SUPPER   [DISCONTINUED] acetaminophen (TYLENOL) 650 MG CR tablet Take 650 mg by mouth at bedtime as needed for pain.   [DISCONTINUED] albuterol (VENTOLIN HFA) 108 (90 Base) MCG/ACT inhaler Inhale 2 puffs into the lungs every 6 (six) hours as needed for wheezing or shortness of breath.   [DISCONTINUED] aspirin 81 MG EC tablet Take 1 tablet (81 mg total) by mouth daily. Swallow whole. (Patient taking differently: Take 81 mg by mouth at bedtime. Swallow whole.)   [DISCONTINUED] fluticasone furoate-vilanterol (BREO ELLIPTA) 200-25 MCG/ACT AEPB Inhale 1 puff into the lungs daily.   [DISCONTINUED] polyethylene glycol (MIRALAX / GLYCOLAX) 17 g packet Take 17 g by mouth daily.   No facility-administered encounter medications on file as of 02/20/2022.    Review of Systems  Constitutional:  Negative for chills, fatigue and fever.  Cardiovascular:  Negative for chest pain, palpitations and leg swelling.  Musculoskeletal:  Positive for arthralgias. Negative for neck pain and neck stiffness.       Left shoulder and hip pain rating 8/10 on scale   Neurological:  Negative for dizziness, weakness, numbness and headaches.    Immunization History  Administered Date(s) Administered   Fluad Quad(high Dose 65+) 04/08/2020, 05/05/2021   Influenza, High Dose Seasonal PF 05/07/2017, 04/03/2018, 02/17/2019   Influenza,inj,Quad PF,6+ Mos 07/08/2015   Influenza,inj,quad, With Preservative 04/03/2018   Influenza-Unspecified 04/18/2017, 05/05/2017,  02/20/2019   PFIZER(Purple Top)SARS-COV-2 Vaccination 01/04/2020, 02/04/2020   Pneumococcal Polysaccharide-23 05/29/2011   Tdap 11/04/2007   Pertinent  Health Maintenance Due  Topic Date Due   COLONOSCOPY (Pts 45-48yr Insurance coverage will need to be confirmed)  09/18/2021   INFLUENZA VACCINE  01/03/2022      12/15/2021    8:30 AM 12/15/2021    8:43 PM 01/02/2022    9:29 AM 01/26/2022    8:57 AM 02/20/2022    1:29 PM  Fall Risk  Falls in the past year?   0 0 1  Was there an injury with Fall?   0 0 0  Fall Risk Category Calculator   0 0 2  Fall Risk Category   Low Low Moderate  Patient Fall Risk Level High fall risk High fall risk High fall risk High fall risk Moderate fall risk  Patient at Risk for Falls Due to   Impaired mobility Impaired mobility History of fall(s);Impaired balance/gait;Impaired mobility  Fall risk Follow up   Falls evaluation completed Falls evaluation completed Falls evaluation completed;Education provided;Falls prevention discussed   Functional Status Survey:    There were no vitals filed for this visit. There is no height or weight on file to calculate BMI. Physical Exam Unable to assess on Telephone visit   Labs reviewed: Recent Labs    12/07/21 0942 12/08/21 0238 12/09/21 0304 12/11/21 0134 12/12/21 0141 12/13/21 0424 12/14/21 0324  NA 137   < > 135   < > 135 132* 134*  K 4.5   < > 4.4   < > 4.9 4.6 4.6  CL 96*   < > 95*   < > 95* 100 100  CO2 36*   < > 33*   < > '31 26 26  '$ GLUCOSE 125*   < > 115*   < >  165* 114* 123*  BUN 16   < > 15   < > '19 17 18  '$ CREATININE 0.73   < > 0.72   < > 0.83 0.70 0.73  CALCIUM 8.7*   < > 8.3*   < > 8.4* 8.0* 8.3*  MG 2.3  --  2.2  --   --  2.2  --   PHOS  --   --   --   --   --  3.9  --    < > = values in this interval not displayed.   Recent Labs    11/30/21 1000 12/01/21 0402 12/13/21 0424  AST '19 15 24  '$ ALT 19 18 34  ALKPHOS 72 68 74  BILITOT 0.8 0.8 0.6  PROT 6.4* 5.8* 5.5*  ALBUMIN 2.9* 2.4*  2.2*   Recent Labs    12/12/21 0141 12/13/21 0424 12/14/21 0324 12/16/21 1421  WBC 10.3 8.4 7.4  --   NEUTROABS 8.1* 6.1 5.4  --   HGB 11.8* 11.6* 11.7* 11.3*  HCT 36.0* 34.0* 34.6* 32.9*  MCV 95.5 92.9 93.3  --   PLT 212 199 207  --    Lab Results  Component Value Date   TSH 1.54 04/08/2020   Lab Results  Component Value Date   HGBA1C 5.4 12/13/2021   Lab Results  Component Value Date   CHOL 159 05/31/2021   HDL 58 05/31/2021   LDLCALC 88 05/31/2021   TRIG 64 05/31/2021   CHOLHDL 2.7 05/31/2021    Significant Diagnostic Results in last 30 days:  ECHOCARDIOGRAM COMPLETE  Result Date: 02/03/2022    ECHOCARDIOGRAM REPORT   Patient Name:   Kyle Lewis Date of Exam: 02/03/2022 Medical Rec #:  [id number]       Height:       68.0 in Accession #:    [id number]      Weight:       215.6 lb Date of Birth:  Feb 03, 1946      BSA:          2.110 m Patient Age:    87 years        BP:           130/78 mmHg Patient Gender: M               HR:           60 bpm. Exam Location:   Procedure: 2D Echo, Cardiac Doppler and Color Doppler Indications:    S/P TAVR (transcatheter aortic valve replacement) [Z95.2                 (ICD-10-CM)]; Endocarditis, unspecified chronicity, unspecified                 endocarditis type [I38 (ICD-10-CM)]  History:        Patient has prior history of Echocardiogram examinations, most                 recent 12/06/2021. CHF, CAD, Aortic Valve Disease; Risk                 Factors:Hypertension and Dyslipidemia.                 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 Procedure Date: 06/07/2021.  Sonographer:    Philipp Deputy RDCS Referring Phys: Waverly Ferrari  [facility]  Sonographer Comments: Suboptimal apical window. Image acquisition challenging due to respiratory motion and Image acquisition challenging due to patient body habitus. Global longitudinal strain was attempted. IMPRESSIONS  1. Left  ventricular ejection fraction, by estimation, is 55 to 60%. The left ventricle has normal function. Left ventricular endocardial border not optimally defined to evaluate regional wall motion. There is mild concentric left ventricular hypertrophy. Left ventricular diastolic parameters are consistent with Grade I diastolic dysfunction (impaired relaxation).  2. Right ventricular systolic function is normal. The right ventricular size is normal.  3. The mitral valve is normal in structure. No evidence of mitral valve regurgitation. No evidence of mitral stenosis.  4. The aortic valve is normal in structure. Aortic valve regurgitation is not visualized. No aortic stenosis is present. 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 Procedure Date: 06/07/2021. Echo findings are consistent with normal structure and function of the aortic valve prosthesis.  5. The inferior vena cava is normal in size with greater than 50% respiratory variability, suggesting right atrial pressure of 3 mmHg. FINDINGS  Left Ventricle: Left ventricular ejection fraction, by estimation, is 55 to 60%. The left ventricle has normal function. Left ventricular endocardial border not optimally defined to evaluate regional wall motion. The left ventricular internal cavity size was normal in size. There is mild concentric left ventricular hypertrophy. Left ventricular diastolic parameters are consistent with Grade I diastolic dysfunction (impaired relaxation). Normal left ventricular filling pressure. Right Ventricle: The right ventricular size is normal. No increase in right ventricular wall thickness. Right ventricular systolic function is normal. Left Atrium: Left atrial size was normal in size. Right Atrium: Right atrial size was normal in size. Pericardium: There is no evidence of pericardial effusion. Mitral Valve: The mitral valve is normal in structure. Mild mitral annular calcification. No evidence of mitral valve  regurgitation. No evidence of mitral valve stenosis. Tricuspid Valve: The tricuspid valve is normal in structure. Tricuspid valve regurgitation is not demonstrated. No evidence of tricuspid stenosis. Aortic Valve: The aortic valve is normal in structure. Aortic valve regurgitation is not visualized. No aortic stenosis is present. Aortic valve mean gradient measures 15.3 mmHg. Aortic valve peak gradient measures 27.5 mmHg. Aortic valve area, by VTI measures 1.76 cm. 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 Procedure Date: 06/07/2021. Echo findings are consistent with normal structure and function of the aortic valve prosthesis. Pulmonic Valve: The pulmonic valve was normal in structure. Pulmonic valve regurgitation is not visualized. No evidence of pulmonic stenosis. Aorta: The aortic root and ascending aorta are structurally normal, with no evidence of dilitation and the aortic arch was not well visualized. Venous: The pulmonary veins were not well visualized. The inferior vena cava is normal in size with greater than 50% respiratory variability, suggesting right atrial pressure of 3 mmHg. IAS/Shunts: No atrial level shunt detected by color flow Doppler.  LEFT VENTRICLE PLAX 2D LVIDd:         5.50 cm   Diastology LVIDs:         3.40 cm   LV e' medial:    8.70 cm/s LV PW:         1.20 cm   LV E/e' medial:  8.4 LV IVS:        1.20 cm   LV e' lateral:   7.72 cm/s LVOT diam:     2.30 cm   LV E/e' lateral: 9.5 LV SV:  97 LV SV Index:   46 LVOT Area:     4.15 cm  RIGHT VENTRICLE RV S prime:     15.20 cm/s TAPSE (M-mode): 3.3 cm LEFT ATRIUM           Index LA diam:      2.70 cm 1.28 cm/m LA Vol (A4C): 48.2 ml 22.84 ml/m  AORTIC VALVE AV Area (Vmax):    1.55 cm AV Area (Vmean):   1.56 cm AV Area (VTI):     1.76 cm AV Vmax:           262.33 cm/s AV Vmean:          183.667 cm/s AV VTI:            0.551 m AV Peak Grad:      27.5 mmHg AV Mean Grad:      15.3 mmHg LVOT  Vmax:         97.90 cm/s LVOT Vmean:        69.100 cm/s LVOT VTI:          0.233 m LVOT/AV VTI ratio: 0.42  AORTA Ao Root diam: 2.60 cm Ao Asc diam:  2.60 cm MITRAL VALVE MV Area (PHT): 2.11 cm     SHUNTS MV Decel Time: 359 msec     Systemic VTI:  0.23 m MV E velocity: 73.20 cm/s   Systemic Diam: 2.30 cm MV A velocity: 104.00 cm/s MV E/A ratio:  0.70 Shirlee More MD Electronically signed by Shirlee More MD Signature Date/Time: 02/03/2022/5:31:08 PM    Final    MR Lumbar Spine W Wo Contrast  Result Date: 01/31/2022 CLINICAL DATA:  Severe low back pain history of epidural abscess. EXAM: MRI LUMBAR SPINE WITHOUT AND WITH CONTRAST TECHNIQUE: Multiplanar and multiecho pulse sequences of the lumbar spine were obtained without and with intravenous contrast. CONTRAST:  63m MULTIHANCE GADOBENATE DIMEGLUMINE 529 MG/ML IV SOLN COMPARISON:  CT abdomen/pelvis 01/23/2022 and lumbar spine MRI 12/10/2021 FINDINGS: Segmentation: Standard; the lowest formed disc space is designated L5-S1. Alignment: There is exaggerated lumbar lordosis with stepwise grade 1 retrolisthesis of L1 on L2 through L3 on L4 and grade 1 anterolisthesis of L4 on L5, unchanged Vertebrae: Postsurgical changes reflecting posterior instrumented fusion from L2 through L5 are again seen with an interbody spacer at L4-L5. Vertebral body heights are preserved. There is confluent T1 hypointensity along the L5-S1 disc space with faint marrow edema. The endplate signal abnormality is more pronounced compared to the study from 12/09/2021. There is enhancing material along the ventral epidural space at L5 and S1 measuring up to approximately 6 mm in thickness on the sagittal plane remains suspicious for phlegmon with a suspected abscess in the region of the right subarticular zone (9-8). Compared to the study from 12/09/2021, the overall thickness of the collection is decreased. There is facet arthropathy with small effusions bilaterally at L5-S1 without convincing  evidence of septic arthritis. There is no other evidence of acute abnormality. Conus medullaris and cauda equina: Conus extends to the L1 level. Conus and cauda equina appear normal. There is no definite abnormal enhancement of the cauda equina nerve roots. Paraspinal and other soft tissues: There is mild prevertebral/presacral edema at L5 through S2. Susceptibility artifact related to prior abdominal aortic aneurysm repair is noted. Disc levels: There is advanced multilevel disc space narrowing throughout the lumbar spine. There is improved canal patency at L5-S1 compared to the prior study due to the decreased size of the epidural collection. There is  facet arthropathy at this level with bilateral effusions but no convincing evidence of septic arthritis within the confines of significant susceptibility artifact from the surrounding hardware. The neural foramina are not well assessed due to the hardware. There is no other significant spinal canal stenosis. There is no other convincing high-grade neural foraminal stenosis. IMPRESSION: Increased T1 hypointensity with associated STIR signal abnormality along the L5-S1 disc space since 12/09/2021 is concerning for persistent discitis/osteomyelitis; however, the epidural phlegmon/abscess along the posterior L5 and S1 endplates appears decreased with improved patency of the canal at L5-S1. Electronically Signed   By: Valetta Mole M.D.   On: 01/31/2022 15:50   CT ABDOMEN PELVIS W WO CONTRAST  Result Date: 01/24/2022 CLINICAL DATA:  Indeterminate renal lesion EXAM: CT ABDOMEN AND PELVIS WITHOUT AND WITH CONTRAST TECHNIQUE: Multidetector CT imaging of the abdomen and pelvis was performed following the standard protocol before and following the bolus administration of intravenous contrast. RADIATION DOSE REDUCTION: This exam was performed according to the departmental dose-optimization program which includes automated exposure control, adjustment of the mA and/or kV  according to patient size and/or use of iterative reconstruction technique. CONTRAST:  155m OMNIPAQUE IOHEXOL 350 MG/ML SOLN COMPARISON:  CTA chest, abdomen and pelvis dated November 30, 2021 FINDINGS: Lower chest: Trace bilateral pleural effusions and bibasilar atelectasis. Prior transcatheter aortic valve replacement. Hepatobiliary: Scattered low-attenuation liver lesions which are too small to completely characterize but likely simple cysts. No suspicious liver lesions. Gallstones with no gallbladder wall thickening. No biliary ductal dilation. Pancreas: Unremarkable. No pancreatic ductal dilatation or surrounding inflammatory changes. Spleen: Normal in size without focal abnormality. Adrenals/Urinary Tract: Stable left adrenal gland adenoma. Right adrenal gland is unremarkable. No hydronephrosis. Bilateral nonobstructing stones. Lesion of the interpolar region of the anterior left kidney measuring 14 mm measures 22 Hounsfield units on noncontrast exam 50 Hounsfield units on arterial phase and 83 Hounsfield units on delayed imaging. Additional bilateral low-attenuation renal lesions, largest are compatible with simple cysts, others are too small to completely characterize. Stomach/Bowel: Stomach is within normal limits. Diverticulosis. Prior partial small bowel resection. No evidence of bowel wall thickening, distention, or inflammatory changes. Vascular/Lymphatic: Prior endovascular repair of abdominal aortic aneurysm with aorto bi-iliac graft with unchanged size of the excluded aneurysm sac. Aortic atherosclerosis. No enlarged abdominal or pelvic lymph nodes. Reproductive: Unremarkable. Other: Small to moderate fat containing left inguinal hernia. No abdominopelvic ascites. Musculoskeletal: Prior posterior fusion of the lumbar spine. No aggressive appearing osseous lesions. IMPRESSION: 1. Enhancing lesion of the interpolar region of the anterior left kidney measuring 14 mm, compatible with papillary RCC. 2. Prior  endovascular repair of abdominal aortic aneurysm with aorto bi-iliac graft with unchanged size of the excluded aneurysm sac. 3. Trace bilateral pleural effusions. 4. Aortic Atherosclerosis (ICD10-I70.0). Electronically Signed   By: LYetta GlassmanM.D.   On: 01/24/2022 16:08    Assessment/Plan  1. Chronic bilateral low back pain with left-sided sciatica Ongoing - oxyCODONE (OXY IR/ROXICODONE) 5 MG immediate release tablet; Take 1 tablet (5 mg total) by mouth every 6 (six) hours as needed for severe pain.  Dispense: 60 tablet; Refill: 0  2. Chronic left shoulder pain Has follow-up with orthopedic surgery -Will oxycodone until December. -PDMP reviewed.No contract in place. use of narcotic and safety discussed.  We will mail contract for patient Since unable to come to office this visit. - oxyCODONE (OXY IR/ROXICODONE) 5 MG immediate release tablet; Take 1 tablet (5 mg total) by mouth every 6 (six) hours as needed for severe  pain.  Dispense: 60 tablet; Refill: 0  Family/ staff Communication: Reviewed plan of care with patient and wife verbalized understanding.   Labs/tests ordered: None   Next Appointment: Return if symptoms worsen or fail to improve.  I connected with  [name] on 02/20/22 by a Telephone enabled telemedicine application and verified that I am speaking with the correct person using two identifiers.   I discussed the limitations of evaluation and management by telemedicine. The patient expressed understanding and agreed to proceed.   Spent 12 minutes of non-face to face with patient  >50% time spent counseling; reviewing medical record; tests; labs; and developing future plan of care.    Sandrea Hughs, NP

## 2022-02-20 NOTE — Progress Notes (Unsigned)
[facility]  CHIEF COMPLAINT:    Follow up for Gemella disseminated infection  SUBJECTIVE:    Kyle Lewis is a 76 y.o. male with PMHx as below who presents to the clinic for disseminated Gemella infection.   Patient here today for follow up.  Last seen 4 weeks ago.  He was admitted at [facility] from 6/8-7/14/23.  There was concern for endocarditis as patient has a TAVR, suspected 2/2 odontogenic source due to poor dentition.   TTE showed possible aortic valve vegetation, TEE showed concern for aortic valve vegetation as well.  CTA chest done showed no abscess.    MRI lumbar spine showed L5-S1 epidural fluid collection.  Patient complained of left shoulder pain, Ortho engaged patient underwent aspiration 7/20 WBC of 27 K and negative cultures on several days of antibiotics.    Seen by cardiology who did not think  findings were reflective of endocarditis and recommended antibiotics for management of epidural abscess.    Seen by neurosurgery Dr. Ronnald Ramp held off decompression at the time, recommended observation and antibiotics.   Seen by dentistry and recommended outpatient follow-up as the patient needed radiographs at follow-up assessment.  His course of 8 weeks IV antibiotics concluded on 01/30/22.  PICC line removed.  Started on oral antibiotics with amoxicillin 1gm TID x 4 weeks through 02/27/22.  Had repeat MRI 01/30/22 showing L5-S1 discitis/OM however the phlegmon/abscess was decreased.  He has another MRI scheduled for 03/13/22 with Dr Ronnald Ramp.   ***  Please see A&P for the details of today's visit and status of the patient's medical problems.   Patient's Medications  New Prescriptions   No medications on file  Previous Medications   AMLODIPINE (NORVASC) 10 MG TABLET    Take 1 tablet (10 mg total) by mouth daily.   AMOXICILLIN (AMOXIL) 500 MG CAPSULE    Take 1,000 mg by mouth 3 (three) times daily.   ATORVASTATIN (LIPITOR) 40 MG TABLET    Take  1 tablet (40 mg total) by mouth daily.   FINASTERIDE (PROSCAR) 5 MG TABLET    Take 1 tablet (5 mg total) by mouth daily.   GABAPENTIN (NEURONTIN) 300 MG CAPSULE    Take 1 capsule (300 mg total) by mouth 2 (two) times daily.   METOPROLOL TARTRATE (LOPRESSOR) 25 MG TABLET    Take 1 tablet (25 mg total) by mouth 2 (two) times daily.   NICOTINE (NICODERM CQ - DOSED IN MG/24 HOURS) 21 MG/24HR PATCH    Place 1 patch (21 mg total) onto the skin daily.   SENNA-DOCUSATE (SENOKOT-S) 8.6-50 MG TABLET    Take 1 tablet by mouth 2 (two) times daily.   SERTRALINE (ZOLOFT) 50 MG TABLET    Take 1 tablet (50 mg total) by mouth daily.   TAMSULOSIN (FLOMAX) 0.4 MG CAPS CAPSULE    TAKE 1 CAPSULE BY MOUTH ONCE DAILY AFTER SUPPER  Modified Medications   No medications on file  Discontinued Medications   No medications on file      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   Abnormal gait 07/31/2019   Abscess of left leg    Acute on chronic diastolic heart failure ([place])    Acute respiratory failure with hypoxia and hypercapnia ([place]) 05/27/2021   HC03   06/14/21    =  31  - 06/23/2021  Walked 250 ft  at a slow pace with a cane. Complained of back and left knee hurting with lowest sat 89% so d/c'd 02    Adenomatous colon polyp    Adrenal mass ([place])    2 benign appearing left adrenal adenomas noted on 01/13/15 CT   Allergy    seasonal   Anxiety 04/08/2020   Arthritis    Blister of multiple sites of lower extremity 01/10/2018   Body mass index (BMI) 34.0-34.9, adult 04/07/2020   BPH with obstruction/lower urinary tract symptoms    Overview:  Probable based on symptoms   CAD (coronary artery disease) 02/01/2018   Carotid arterial disease ([place]) 05/23/2012   Carotid artery occlusion    Chronic back pain    Cigarette smoker 09/05/2018   COLONIC POLYPS, ADENOMATOUS, HX OF 03/17/2010   Qualifier: Diagnosis of  By: Harlon Ditty CMA (AAMA),  Dottie     COPD GOLD II 04/07/2019   Quit smoking 03/2019 - PFT's  04/07/2019  FEV1 1.58 (57 % ) ratio 0.54  p 0 % improvement from saba p nothing prior to study with DLCO  15.22 (66%) corrects to 3.05 (74%)  for alv volume and FV curve concave classically     Degeneration of cervical intervertebral disc    Degeneration of lumbar intervertebral disc 06/14/2015   Depression 03/17/2010   Qualifier: Diagnosis of  By: Nelson-Smith CMA (AAMA), Dottie     Diverticulosis    DIVERTICULOSIS, COLON 03/17/2010   Qualifier: Diagnosis of  By: Harlon Ditty CMA (AAMA), Dottie     Dyspnea    Exogenous obesity 11/08/2013   Overview:  With a nine pound weight gain since his last visit   Heart murmur    History of back surgery    Rods and Screws in back   History of craniotomy 01/11/2018   History of left knee replacement    History of right MCA stroke 07/21/2014   Hyperlipidemia LDL goal <70 01/19/2018   Hypertension    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 Oneida Alar and 06/10/2015 last visit   Impairment of balance 10/27/2021   Inflammation of sacroiliac joint ([place]) 05/06/2019   Lower urinary tract symptoms (LUTS) 08/06/2017   Meningioma ([place])    Microscopic hematuria 11/08/2013   Mitral regurgitation 06/02/2021   Muscle weakness 10/27/2021   Neck pain 03/25/2019   Nocturia    OA (osteoarthritis) of knee 04/01/2012   OAB (overactive bladder) 01/19/2014   OSA (obstructive sleep apnea)    Bipap per Chodri since ? 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 MAngelena Form& Dr. BCyndia Bent  Severe aortic stenosis 02/01/2018   ECHO 01/30/18 - Left ventricle: The cavity size was normal. There was moderate   focal basal hypertrophy of the septum. Systolic function was   vigorous. The estimated ejection fraction was in the range  of 65%   to 70%. Wall motion was normal; there were no regional wall   motion abnormalities. Doppler parameters are consistent with   abnormal left ventricular relaxation (grade 1 diastolic   dysfun   Status post AAA (abdominal aortic aneurysm) repair 02/01/2018   Stroke ([place]) 2013   tia no residual deficit from   Tobacco use 11/08/2013   Ventral hernia 11/08/2013   Overview:  S/p repair with alloderm mesh and s/p SB resection    Social History   Tobacco Use   Smoking status: Former    Packs/day: 0.75    Years: 40.00    Total pack years: 30.00    Types: Cigarettes    Quit date: 05/05/2021    Years since quitting: 0.7   Smokeless tobacco: Never   Tobacco comments:    1/2 pack a day if that   Vaping Use   Vaping Use: Never used  Substance Use Topics   Alcohol use: Yes    Comment: rare   Drug use: No    Family History  Problem Relation Age of Onset   Heart disease Mother        Onset ~39 y/o   Hypertension Mother        Deceased from old age at 12   Hyperlipidemia Mother    Arthritis Mother    Diabetes Father        Deceased from old age at 54   Heart attack Father    Heart disease Father        CABG at age 19   Dementia Father 46   Arthritis Father    Prostate cancer Maternal Grandfather    Colon cancer Neg Hx    Esophageal cancer Neg Hx    Rectal cancer Neg Hx    Stomach cancer Neg Hx     Allergies  Allergen Reactions   Cefazolin Rash and Other (See Comments)    The patient had surgery and was given cefazolin intraop. ~ 10 days later he developed a rash confirmed by biopsy to be consistent w/ drug  eruption. We cannot know for sure, but this is the most likely agent.      ROS  OBJECTIVE:    There were no vitals filed for this visit.  There is no height or weight on file to calculate BMI.  Physical Exam   Labs and Microbiology:    Latest Ref Rng & Units 12/16/2021    2:21 PM 12/14/2021    3:24 AM 12/13/2021    4:24 AM  CBC  WBC 4.0 - 10.5 K/uL  7.4  8.4   Hemoglobin 13.0 - 17.0 g/dL 11.3  11.7  11.6   Hematocrit 39.0 - 52.0 % 32.9  34.6  34.0   Platelets 150 - 400 K/uL  207  199       Latest Ref Rng & Units 12/14/2021    3:24 AM 12/13/2021    4:24 AM 12/12/2021    1:41 AM  CMP  Glucose 70 - 99 mg/dL 123  114  165   BUN 8 - 23 mg/dL '18  17  19   '$ Creatinine 0.61 - 1.24 mg/dL 0.73  0.70  0.83   Sodium 135 - 145 mmol/L 134  132  135   Potassium 3.5 - 5.1 mmol/L 4.6  4.6  4.9   Chloride 98 - 111 mmol/L 100  100  95   CO2 22 - 32 mmol/L '26  26  31   '$ Calcium  8.9 - 10.3 mg/dL 8.3  8.0  8.4   Total Protein 6.5 - 8.1 g/dL  5.5    Total Bilirubin 0.3 - 1.2 mg/dL  0.6    Alkaline Phos 38 - 126 U/L  74    AST 15 - 41 U/L  24    ALT 0 - 44 U/L  34         ASSESSMENT & PLAN:     #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  -CTA on 7/11 showed no abscess. -Completed 8 weeks of IV antibiotics with PCN (plus 2 weeks of gentamicin for synergy) on 01/30/22 -Now on high dose amoxicillin x 4 weeks through 02/07/22 -Source likely dental as gemella is found in oral cavity. Dentistry recommend outpatient follow-up as they were not able to do radiographs. -Repeat MRI lumbar spine 01/30/22 shows decreasing phlegmon.  Not surprisingly still with changes of  OM/discitis.     Plan:  -Continue oral Amoxicillin 1gm TID x 4 weeks through 02/27/22.  Then will do long term suppression with '500mg'$  TID given clinical concern for TAVR endocarditis -Duration of therapy likely lifelong as long as tolerated -Follow up in 3 months.   #Epidural abscess, discitis/OM L5-S1 -Epidural abcsess present on MRI on 7/5 and 7/8 -Neurosurgery, Dr. Sherley Bounds was consulted, recommended to observation on antibiotics.  Noted that intervention may involve fusion with instrumentation/defer decompression at that time. -Antibiotics and MRI spine as above   #Septic arthritis of left shoulder status post aspiration - Synovial cell count 27.5 K WBC, 92% neutrophils, 4% Lymphs, 4% monocytes, Cx NG -No new shoulder issues at this visit   [facility] 02/20/2022, 1:28 PM  I have personally spent 30 minutes involved in face-to-face and non-face-to-face activities for this patient on the day of the visit. Professional time spent includes the following activities: Preparing to see the patient (review of tests), Obtaining and/or reviewing separately obtained history (admission/discharge record), Performing a medically appropriate examination and/or evaluation , Ordering medications/tests/procedures, referring and communicating with other health care professionals, Documenting clinical information in the EMR, Independently interpreting results (not separately reported), Communicating results to the patient/family/caregiver, Counseling and educating the patient/family/caregiver and Care coordination (not separately reported).

## 2022-02-21 ENCOUNTER — Ambulatory Visit (INDEPENDENT_AMBULATORY_CARE_PROVIDER_SITE_OTHER): Payer: Medicare Other | Admitting: Internal Medicine

## 2022-02-21 ENCOUNTER — Encounter: Payer: Self-pay | Admitting: Internal Medicine

## 2022-02-21 ENCOUNTER — Other Ambulatory Visit: Payer: Self-pay

## 2022-02-21 VITALS — BP 110/77 | HR 54 | Temp 97.8°F | Wt 213.0 lb

## 2022-02-21 DIAGNOSIS — T826XXD Infection and inflammatory reaction due to cardiac valve prosthesis, subsequent encounter: Secondary | ICD-10-CM | POA: Diagnosis present

## 2022-02-21 DIAGNOSIS — I38 Endocarditis, valve unspecified: Secondary | ICD-10-CM | POA: Diagnosis not present

## 2022-02-21 DIAGNOSIS — T826XXA Infection and inflammatory reaction due to cardiac valve prosthesis, initial encounter: Secondary | ICD-10-CM | POA: Diagnosis not present

## 2022-02-21 MED ORDER — AMOXICILLIN 500 MG PO CAPS
500.0000 mg | ORAL_CAPSULE | Freq: Three times a day (TID) | ORAL | 2 refills | Status: DC
Start: 2022-02-28 — End: 2022-05-10

## 2022-02-21 NOTE — Progress Notes (Signed)
Patient contract for Oxycodone mailed. Patient needs to sign and send back to office per Marlowe Sax, NP.

## 2022-02-21 NOTE — Patient Instructions (Signed)
Thank you for coming to see me today. It was a pleasure seeing you.  To Do: Continue Amoxicillin 1gm three times per day through 02/27/22 Starting on 02/28/22, start taking Amoxicillin '500mg'$  three times per day for infection suppression I sent in a new refill to your pharmacy Follow up with me in 3 months  If you have any questions or concerns, please do not hesitate to call the office at [phone].  Take Care,   Jule Ser

## 2022-02-22 ENCOUNTER — Ambulatory Visit: Payer: Self-pay

## 2022-02-22 ENCOUNTER — Telehealth: Payer: Self-pay

## 2022-02-22 DIAGNOSIS — G8929 Other chronic pain: Secondary | ICD-10-CM

## 2022-02-22 NOTE — Telephone Encounter (Signed)
Patient will need a face to face visit to order for Home health service.Had a telephone visit with provider.

## 2022-02-22 NOTE — Patient Instructions (Signed)
Visit Information  Thank you for taking time to visit with me today. Please don't hesitate to contact me if I can be of assistance to you.   Following are the goals we discussed today:   Goals Addressed             This Visit's Progress    Care Coordination Activities       Care Coordination Interventions: Collaboration with Lorenza Burton with [facility] to place the patient on the wait list for in home care Communication received from Corky Mull with [facility] advising they do not currently have a transport chair available Outbound call placed to [facility] requesting a return call to inquire if a transport chair is available Contacted patients wife and caregiver to advise of above updates Discussed yesterday was a very hard day and spouse having difficulty caring for the patient in the home Education provided on PACE program - declined referral at this time Advised to expect a call from [facility] over the next month to schedule an intake        If you are experiencing a [place] or [place] or need someone to talk to, please call 1-800-273-TALK (toll free, 24 hour hotline)  Patient verbalizes understanding of instructions and care plan provided today and agrees to view in [place]. Active MyChart status and patient understanding of how to access instructions and care plan via MyChart confirmed with patient.     I will follow up with you once receiving feedback on transport chair from [facility]. Please call me as needed.   Daneen Schick, BSW, CDP Social Worker, Certified Dementia Practitioner [facility]  Care Coordination [phone]

## 2022-02-22 NOTE — Patient Outreach (Signed)
  Care Coordination   Follow Up Visit Note   02/22/2022 Name: Kyle Lewis MRN: [id number] DOB: 05/27/1946  Kyle Lewis is a 76 y.o. year old male who sees [name], MD for primary care. I  spoke with patients wife and caregiver [name] by phone.  What matters to the patients health and wellness today?  To get help in the home    Goals Addressed             This Visit's Progress    Care Coordination Activities       Care Coordination Interventions: Collaboration with Lorenza Burton with [facility] to place the patient on the wait list for in home care Communication received from Corky Mull with [facility] advising they do not currently have a transport chair available Outbound call placed to [facility] requesting a return call to inquire if a transport chair is available Contacted patients wife and caregiver to advise of above updates Discussed yesterday was a very hard day and spouse having difficulty caring for the patient in the home Education provided on PACE program - declined referral at this time Advised to expect a call from [facility] over the next month to schedule an intake         SDOH assessments and interventions completed:  No     Care Coordination Interventions Activated:  Yes  Care Coordination Interventions:  Yes, provided   Follow up plan:  SW will continue to follow.    Encounter Outcome:  Pt. Visit Completed   Daneen Schick, BSW, CDP Social Worker, Certified Dementia Practitioner [facility]  Care Coordination [phone]

## 2022-02-22 NOTE — Telephone Encounter (Signed)
Requesting that we call the home health agency to inform them that the doctor from the nursing home gave orders for home health for 48 hours and it needs to be extended.  I informed Mrs.Blum that we would need to initiate a home health referral as the patients PCP, as we do not know what orders the nursing home doctor gave. I asked Mrs.Golubski for the name of the home health agency and she replied " I believe it's [facility] and you can reach them at [phone].  Patient recently had a visit with Marlowe Sax, NP (Dr.Miller's NP). I will send order request to her as an addendum will need to be made to that note along with signing of pending home health referral.   Mrs.Maisel is asking for a return call once referral order processed.

## 2022-02-22 NOTE — Telephone Encounter (Signed)
Spoke with patients spouse. Video visit scheduled with Kyle Lewis tomorrow to request home health orders.   Side note [facility] was booked, Patient requested pm appointment [name] only has am appointments available).

## 2022-02-23 ENCOUNTER — Other Ambulatory Visit: Payer: Self-pay | Admitting: Orthopedic Surgery

## 2022-02-23 ENCOUNTER — Ambulatory Visit: Payer: Self-pay

## 2022-02-23 ENCOUNTER — Telehealth: Payer: Self-pay

## 2022-02-23 ENCOUNTER — Ambulatory Visit: Payer: Medicare Other | Admitting: Internal Medicine

## 2022-02-23 ENCOUNTER — Telehealth (INDEPENDENT_AMBULATORY_CARE_PROVIDER_SITE_OTHER): Payer: Medicare Other | Admitting: Orthopedic Surgery

## 2022-02-23 DIAGNOSIS — R531 Weakness: Secondary | ICD-10-CM | POA: Diagnosis not present

## 2022-02-23 DIAGNOSIS — R296 Repeated falls: Secondary | ICD-10-CM

## 2022-02-23 DIAGNOSIS — R2681 Unsteadiness on feet: Secondary | ICD-10-CM

## 2022-02-23 NOTE — Progress Notes (Signed)
Careteam: Patient Care Team: [name], MD as PCP - General (Family Medicine) Revankar, Reita Cliche, MD as PCP - Cardiology (Cardiology) Druscilla Brownie, MD as Consulting Physician (Dermatology) Garvin Fila, MD as Consulting Physician (Neurology) Daneen Schick as [facility]  Seen by: Windell Moulding, AGNP-C  PLACE OF SERVICE:  [place] Directive information    Allergies  Allergen Reactions   Cefazolin Rash and Other (See Comments)    The patient had surgery and was given cefazolin intraop. ~ 10 days later he developed a rash confirmed by biopsy to be consistent w/ drug eruption. We cannot know for sure, but this is the most likely agent.      Chief Complaint  Patient presents with   Acute Visit    Patient wife states he needs a home health referral.     HPI: Patient is a 76 y.o. male seen today via video visit due to unstable gait.   Hospitalized 06/28- 07/14 due to CAP, gemella sanguinous bacteremia and septic left shoulder arthritis. He was discharged to [facility] for skilled nursing. Discharged home last week. Wife has been helping at home. Since discharge, he has been unable to ambulate long distances. He can only ambulate to bathroom with walker. He has had multiple falls at home. No apparent injury. He is having increased back pain when ambulating as well. He is followed by Dr. Ronnald Ramp, lumbar MRI scheduled 03/13/2022. Requesting home health PT/OT today.     Review of Systems:  Review of Systems  Constitutional:  Negative for chills, fever, malaise/fatigue and weight loss.  HENT: Negative.    Eyes: Negative.   Respiratory:  Negative for cough, shortness of breath and wheezing.   Cardiovascular:  Negative for chest pain and leg swelling.  Gastrointestinal: Negative.   Genitourinary: Negative.   Musculoskeletal:  Positive for back pain, falls and joint pain.  Skin: Negative.   Neurological: Negative.    Psychiatric/Behavioral: Negative.      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   Abnormal gait 07/31/2019   Abscess of left leg    Acute on chronic diastolic heart failure (HCC)    Acute respiratory failure with hypoxia and hypercapnia ([place]) 05/27/2021   HC03   06/14/21    =  31  - 06/23/2021   Walked 250 ft  at a slow pace with a cane. Complained of back and left knee hurting with lowest sat 89% so d/c'd 02    Adenomatous colon polyp    Adrenal mass ([place])    2 benign appearing left adrenal adenomas noted on 01/13/15 CT   Allergy    seasonal   Anxiety 04/08/2020   Arthritis    Blister of multiple sites of lower extremity 01/10/2018   Body mass index (BMI) 34.0-34.9, adult 04/07/2020   BPH with obstruction/lower urinary tract symptoms    Overview:  Probable based on symptoms   CAD (coronary artery disease) 02/01/2018   Carotid arterial disease ([place]) 05/23/2012   Carotid artery occlusion    Chronic back pain    Cigarette smoker 09/05/2018   COLONIC POLYPS, ADENOMATOUS, HX OF 03/17/2010   Qualifier: Diagnosis of  By: Nelson-Smith CMA (AAMA), Dottie     COPD GOLD II 04/07/2019   Quit smoking 03/2019 - PFT's  04/07/2019  FEV1 1.58 (57 % ) ratio 0.54  p 0 %  improvement from saba p nothing prior to study with DLCO  15.22 (66%) corrects to 3.05 (74%)  for alv volume and FV curve concave classically     Degeneration of cervical intervertebral disc    Degeneration of lumbar intervertebral disc 06/14/2015   Depression 03/17/2010   Qualifier: Diagnosis of  By: Nelson-Smith CMA (AAMA), Dottie     Diverticulosis    DIVERTICULOSIS, COLON 03/17/2010   Qualifier: Diagnosis of  By: Harlon Ditty CMA (AAMA), Dottie     Dyspnea    Exogenous obesity 11/08/2013   Overview:  With a nine pound weight gain since his last visit   Heart murmur    History of back surgery    Rods and Screws in back   History of craniotomy 01/11/2018    History of left knee replacement    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 Oneida Alar and 06/10/2015 last visit   Impairment of balance 10/27/2021   Inflammation of sacroiliac joint ([place]) 05/06/2019   Lower urinary tract symptoms (LUTS) 08/06/2017   Meningioma ([place])    Microscopic hematuria 11/08/2013   Mitral regurgitation 06/02/2021   Muscle weakness 10/27/2021   Neck pain 03/25/2019   Nocturia    OA (osteoarthritis) of knee 04/01/2012   OAB (overactive bladder) 01/19/2014   OSA (obstructive sleep apnea)    Bipap per Chod[facility] since ? 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 MAngelena Form& Dr. BCyndia Bent  Severe aortic stenosis 02/01/2018   ECHO 01/30/18 - Left ventricle: The cavity size was normal. There was moderate   focal basal hypertrophy of the septum. Systolic function was   vigorous. The estimated ejection fraction was in the range of 65%   to 70%. Wall motion was normal; there were no regional wall   motion abnormalities. Doppler parameters are consistent with   abnormal left ventricular relaxation (grade 1 diastolic   dysfun   Status post AAA (abdominal aortic aneurysm) repair 02/01/2018   Stroke ([place] 2013   tia no residual deficit from   Tobacco use 11/08/2013   Ventral hernia 11/08/2013   Overview:  S/p repair with alloderm mesh and s/p  SB resection   Past Surgical History:  Procedure Laterality Date   ABDOMINAL AORTIC ANEURYSM REPAIR  [place]   BACK SURGERY  2018   Rods and Screws in Back lower back   BRAIN MENINGIOMA EXCISION  1991   menigioma   CARPAL TUNNEL RELEASE     left   EYE SURGERY Bilateral    Cataract   IRRIGATION AND DEBRIDEMENT ABSCESS Left 08/25/2020   Procedure: IRRIGATION AND DEBRIDEMENT ABSCESS LEFT LEG;  Surgeon: PEvelina Bucy DPM;  [facility];  Service: Podiatry;  Laterality: Left;   JOINT REPLACEMENT Left 04-01-12   Knee   LEFT HEART CATH AND CORONARY ANGIOGRAPHY N/A 09/15/2016   Procedure: Left Heart Cath and Coronary Angiography;  Surgeon: Nelva Bush, MD;  Location: [facility];  Service: Cardiovascular;  Laterality: N/A;   MAXIMUM ACCESS (MAS)POSTERIOR LUMBAR INTERBODY FUSION (PLIF) 1 LEVEL N/A 07/07/2015   Procedure:  POSTERIOR LUMBAR INTERBODY FUSION (PLIF) Lumbar Four-Five with Pedicle Screw Fixation Lumbar Two-Five;Laminectomy Lumbar Two-Five;  Surgeon: Eustace Moore, MD;  Location: [facility];  Service: Neurosurgery;  Laterality: N/A;   POSTERIOR LUMBAR INTERBODY FUSION (PLIF) Lumbar Four-Five with Pedicle Screw Fixation Lumbar Two-Five;Laminectomy Lumbar Two-Five   RIGHT/LEFT HEART CATH AND CORONARY ANGIOGRAPHY N/A 01/24/2018   Procedure: RIGHT/LEFT HEART CATH AND CORONARY ANGIOGRAPHY;  Surgeon: Nelva Bush, MD;  Location: [facility];  Service: Cardiovascular;  Laterality: N/A;   RIGHT/LEFT HEART CATH AND CORONARY ANGIOGRAPHY N/A 05/31/2021   Procedure: RIGHT/LEFT HEART CATH AND CORONARY ANGIOGRAPHY;  Surgeon: Burnell Blanks, MD;  Location: [facility];  Service: Cardiovascular;  Laterality: N/A;   TEE WITHOUT CARDIOVERSION N/A 06/07/2021   Procedure: TRANSESOPHAGEAL ECHOCARDIOGRAM (TEE);  Surgeon: Burnell Blanks, MD;  Location: [facility];  Service: Open Heart Surgery;  Laterality: N/A;    TEE WITHOUT CARDIOVERSION N/A 12/08/2021   Procedure: TRANSESOPHAGEAL ECHOCARDIOGRAM (TEE);  Surgeon: Berniece Salines, DO;  Location: [facility];  Service: Cardiovascular;  Laterality: N/A;   TONSILLECTOMY  as child   TOTAL KNEE ARTHROPLASTY  04/01/2012   Procedure: TOTAL KNEE ARTHROPLASTY;  Surgeon: Gearlean Alf, MD;  Location: [facility];  Service: Orthopedics;  Laterality: Left;   TRANSCATHETER AORTIC VALVE REPLACEMENT, TRANSFEMORAL Bilateral 06/07/2021   Procedure: TRANSCATHETER AORTIC VALVE REPLACEMENT, RIGHT TRANSFEMORAL;  Surgeon: Burnell Blanks, MD;  Location: [facility];  Service: Open Heart Surgery;  Laterality: Bilateral;   TRANSURETHRAL RESECTION OF PROSTATE     ULTRASOUND GUIDANCE FOR VASCULAR ACCESS Bilateral 06/07/2021   Procedure: ULTRASOUND GUIDANCE FOR VASCULAR ACCESS;  Surgeon: Burnell Blanks, MD;  Location: [facility];  Service: Open Heart Surgery;  Laterality: Bilateral;   UMBILICAL HERNIA REPAIR  2011   Social History:   reports that he quit smoking about 9 months ago. His smoking use included cigarettes. He has a 30.00 pack-year smoking history. He has never used smokeless tobacco. He reports current alcohol use. He reports that he does not use drugs.  Family History  Problem Relation Age of Onset   Heart disease Mother        Onset ~60 y/o   Hypertension Mother        Deceased from old age at 35   Hyperlipidemia Mother    Arthritis Mother    Diabetes Father        Deceased from old age at 74   Heart attack Father    Heart disease Father        CABG at age 64   Dementia Father 3   Arthritis Father    Prostate cancer Maternal Grandfather    Colon cancer Neg Hx    Esophageal cancer Neg Hx    Rectal cancer Neg Hx    Stomach cancer Neg Hx     Medications: Patient's Medications  New Prescriptions   No medications on file  Previous Medications   AMLODIPINE (NORVASC) 10 MG TABLET    Take 1 tablet (10 mg total) by mouth daily.   AMOXICILLIN (AMOXIL) 500  MG CAPSULE  Take 1 capsule (500 mg total) by mouth 3 (three) times daily.   ATORVASTATIN (LIPITOR) 40 MG TABLET    Take 1 tablet (40 mg total) by mouth daily.   FINASTERIDE (PROSCAR) 5 MG TABLET    Take 1 tablet (5 mg total) by mouth daily.   GABAPENTIN (NEURONTIN) 300 MG CAPSULE    Take 1 capsule (300 mg total) by mouth 2 (two) times daily.   METOPROLOL TARTRATE (LOPRESSOR) 25 MG TABLET    Take 1 tablet (25 mg total) by mouth 2 (two) times daily.   NICOTINE (NICODERM CQ - DOSED IN MG/24 HOURS) 21 MG/24HR PATCH    Place 1 patch (21 mg total) onto the skin daily.   OXYCODONE (OXY IR/ROXICODONE) 5 MG IMMEDIATE RELEASE TABLET    Take 1 tablet (5 mg total) by mouth every 6 (six) hours as needed for severe pain.   SENNA-DOCUSATE (SENOKOT-S) 8.6-50 MG TABLET    Take 1 tablet by mouth 2 (two) times daily.   SERTRALINE (ZOLOFT) 50 MG TABLET    Take 1 tablet (50 mg total) by mouth daily.   TAMSULOSIN (FLOMAX) 0.4 MG CAPS CAPSULE    TAKE 1 CAPSULE BY MOUTH ONCE DAILY AFTER SUPPER  Modified Medications   No medications on file  Discontinued Medications   No medications on file    Physical Exam:  There were no vitals filed for this visit. There is no height or weight on file to calculate BMI. Wt Readings from Last 3 Encounters:  02/21/22 213 lb (96.6 kg)  12/16/21 215 lb 9.8 oz (97.8 kg)  11/25/21 215 lb 9.8 oz (97.8 kg)    Physical Exam- unable to perform due to video exam  Labs reviewed: Basic Metabolic Panel: Recent Labs    12/07/21 0942 12/08/21 0238 12/09/21 0304 12/11/21 0134 12/12/21 0141 12/13/21 0424 12/14/21 0324  NA 137   < > 135   < > 135 132* 134*  K 4.5   < > 4.4   < > 4.9 4.6 4.6  CL 96*   < > 95*   < > 95* 100 100  CO2 36*   < > 33*   < > '31 26 26  '$ GLUCOSE 125*   < > 115*   < > 165* 114* 123*  BUN 16   < > 15   < > '19 17 18  '$ CREATININE 0.73   < > 0.72   < > 0.83 0.70 0.73  CALCIUM 8.7*   < > 8.3*   < > 8.4* 8.0* 8.3*  MG 2.3  --  2.2  --   --  2.2  --   PHOS  --    --   --   --   --  3.9  --    < > = values in this interval not displayed.   Liver Function Tests: Recent Labs    11/30/21 1000 12/01/21 0402 12/13/21 0424  AST '19 15 24  '$ ALT 19 18 34  ALKPHOS 72 68 74  BILITOT 0.8 0.8 0.6  PROT 6.4* 5.8* 5.5*  ALBUMIN 2.9* 2.4* 2.2*   No results for input(s): "LIPASE", "AMYLASE" in the last 8760 hours. No results for input(s): "AMMONIA" in the last 8760 hours. CBC: Recent Labs    12/12/21 0141 12/13/21 0424 12/14/21 0324 12/16/21 1421  WBC 10.3 8.4 7.4  --   NEUTROABS 8.1* 6.1 5.4  --   HGB 11.8* 11.6* 11.7* 11.3*  HCT 36.0* 34.0* 34.6* 32.9*  MCV  95.5 92.9 93.3  --   PLT 212 199 207  --    Lipid Panel: Recent Labs    05/31/21 0419  CHOL 159  HDL 58  LDLCALC 88  TRIG 64  CHOLHDL 2.7   TSH: No results for input(s): "TSH" in the last 8760 hours. A1C: Lab Results  Component Value Date   HGBA1C 5.4 12/13/2021     Assessment/Plan 1. Unstable gait - hospitalized 06/28-07/14- CAP, bacteremia, septic arthritis - brief stay at SNF- [facility] - home x 1 week - frequent falls  - only ambulates to bathroom - ambulating with walker - Ambulatory referral to [facility]  2. Weakness - see above - Ambulatory referral to Home Health  3. Frequent falls - see above - Ambulatory referral to [facility] encounter   I connected with [name] via video visit and verified that I am speaking with the correct person using two identifiers.   Location: [facility] Patient: Kyle Lewis Provider: Yvonna Alanis, NP     I discussed the limitations, risks, security and privacy concerns of performing an evaluation and management service by telephone and the availability of in person appointments. I also discussed with the patient that there may be a patient responsible charge related to this service. The patient expressed understanding and agreed to proceed.    I discussed the assessment and treatment  plan with the patient. The patient was provided an opportunity to ask questions and all were answered. The patient agreed with the plan and demonstrated an understanding of the instructions.     The patient was advised to call back or seek an in-person evaluation if the symptoms worsen or if the condition fails to improve as anticipated.   I provided 12 minutes of non-face-to-face time during this encounter.   [place], NP Avs printed and mailed    Next appt: Visit date not found  [facility] [phone]

## 2022-02-23 NOTE — Patient Outreach (Signed)
  Care Coordination   Follow Up Visit Note   02/23/2022 Name: Kyle Lewis MRN: [id number] DOB: [birth year]  Kyle Lewis is a 76 y.o. year old male who sees [name], MD for primary care. I  spoke with patients wife and caregiver Kyle Lewis by phone today.  What matters to the patients health and wellness today?  To obtain assistance in the home    Goals Addressed             This Visit's Progress    Care Coordination Activities       Care Coordination Interventions: Collaboration with [facility] who indicates they do not have a transport chair available at this time. Encourage patient spouse to come see other equipment available such as a rollator with a seat and back Spoke with patients spouse who indicates she will receive a prescription in the mail for patients rollator as she wants a specific brand and is working on finding a DME agency that supplies desired equipment Encouraged Kyle Lewis to call [facility] to determine if any other equipment is available that would be beneficial to the patient Determined home health has not started due to orders expiring, Kyle Lewis has contacted patients primary care provider to request new orders for home health Discussed plans for SW to follow up over the next month, wife to contact SW as needed         SDOH assessments and interventions completed:  No     Care Coordination Interventions Activated:  Yes  Care Coordination Interventions:  Yes, provided   Follow up plan: Follow up call scheduled for 10/23    Encounter Outcome:  Pt. Visit Completed   Daneen Schick, Arita Miss, CDP Social Worker, Certified Dementia Practitioner [facility]  Care Coordination [phone]

## 2022-02-23 NOTE — Telephone Encounter (Signed)
Patient had a video visit with Amy today and forgot to tell her about an order request for a adjustable bedside commode.  Patient is requesting for order to be sent to [facility]  Phone number: [phone] (will need to call to get fax number)

## 2022-02-23 NOTE — Patient Instructions (Signed)
Visit Information  Thank you for taking time to visit with me today. Please don't hesitate to contact me if I can be of assistance to you.   Following are the goals we discussed today:   Goals Addressed             This Visit's Progress    Care Coordination Activities       Care Coordination Interventions: Collaboration with [facility] who indicates they do not have a transport chair available at this time. Encourage patient spouse to come see other equipment available such as a rollator with a seat and back Spoke with patients spouse who indicates she will receive a prescription in the mail for patients rollator as she wants a specific brand and is working on finding a DME agency that supplies desired equipment Encouraged [name] to call [facility] to determine if any other equipment is available that would be beneficial to the patient Determined home health has not started due to orders expiring, [name] has contacted patients primary care provider to request new orders for home health Discussed plans for SW to follow up over the next month, wife to contact SW as needed         Our next appointment is by telephone on 10/23 at 12:30  Please call the care guide team at [phone] if you need to cancel or reschedule your appointment.   If you are experiencing a Mental Health or [place] or need someone to talk to, please call 1-800-273-TALK (toll free, 24 hour hotline)  Patient verbalizes understanding of instructions and care plan provided today and agrees to view in [facility]. Active MyChart status and patient understanding of how to access instructions and care plan via MyChart confirmed with patient.     Telephone follow up appointment with care management team member scheduled for:10/23. Please contact me sooner if needed.  Daneen Schick, BSW, CDP Social Worker, Certified Dementia Practitioner [facility]   Care Coordination [phone]

## 2022-02-23 NOTE — Telephone Encounter (Signed)
Orders was faxed to [facility].

## 2022-02-23 NOTE — Telephone Encounter (Signed)
FYI- incoming call received from [facility] stating that face-to-face via video visit is no longer acceptable per the insurance companies. Patient will need a in person visit to qualify for services.  I called Kyle Lewis and appointment scheduled in person with [facility] for next Tuesday 02/28/22 @ 1:20 pm.

## 2022-02-23 NOTE — Telephone Encounter (Signed)
Orders placed and given to Kyle Lewis to fax out.

## 2022-02-27 ENCOUNTER — Other Ambulatory Visit: Payer: Self-pay | Admitting: *Deleted

## 2022-02-27 DIAGNOSIS — R2681 Unsteadiness on feet: Secondary | ICD-10-CM

## 2022-02-28 ENCOUNTER — Telehealth: Payer: Self-pay

## 2022-02-28 ENCOUNTER — Encounter: Payer: Self-pay | Admitting: Adult Health

## 2022-02-28 ENCOUNTER — Ambulatory Visit (INDEPENDENT_AMBULATORY_CARE_PROVIDER_SITE_OTHER): Payer: Medicare Other | Admitting: Adult Health

## 2022-02-28 VITALS — BP 140/80 | HR 67 | Temp 97.7°F | Resp 20 | Ht 67.0 in | Wt 213.0 lb

## 2022-02-28 DIAGNOSIS — I2510 Atherosclerotic heart disease of native coronary artery without angina pectoris: Secondary | ICD-10-CM | POA: Diagnosis not present

## 2022-02-28 DIAGNOSIS — R531 Weakness: Secondary | ICD-10-CM

## 2022-02-28 DIAGNOSIS — R2681 Unsteadiness on feet: Secondary | ICD-10-CM

## 2022-02-28 DIAGNOSIS — R296 Repeated falls: Secondary | ICD-10-CM

## 2022-02-28 NOTE — Progress Notes (Signed)
[facility] clinic  Provider:  Durenda Age DNP  Code Status:  Full Code  Goals of Care:     02/20/2022    1:30 PM  Advanced Directives  Does Patient Have a Medical Advance Directive? No  Does patient want to make changes to medical advance directive? No - Patient declined     Chief Complaint  Patient presents with   Medical Management of Chronic Issues    Patient is here for a referral for home health    HPI: Patient is a 76 y.o. male seen today for an acute visit for referral to [place]. He was recently hospitalized 11/30/21 to 12/16/21 for community-acquired pneumonia, COPD exacerbation with associated acute hypoxic respiratory failure.  Hospitalization course was complicated by Gemella Sanguinous bacteremia with source suspected to be due to odontogenic from poor dentition.  CT maxillofacial showed no dental abscess.  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  He had short-term rehabilitation at [facility] for 1.5 months and was discharged on 02/10/22. He is accompanied today by his wife. He is requesting to have Home health referral for Home health aide to assist him at home with ADLs. Wife stated that he fell twice  on September 8, on day of discharge home from SNF. He uses walker when he ambulates. He has chronic back pain. He completed antibiotics for epidural abscess. He will have repeat MRI of lumbar spine on 03/13/22.  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   Abnormal gait 07/31/2019   Abscess of left leg    Acute on chronic diastolic heart failure (HCC)    Acute respiratory failure with hypoxia and hypercapnia ([place]) 05/27/2021   HC03   06/14/21     =  31  - 06/23/2021   Walked 250 ft  at a slow pace with a cane. Complained of back and left knee hurting with lowest sat 89% so d/c'd 02    Adenomatous colon polyp    Adrenal mass ([place])    2 benign appearing left adrenal adenomas noted on 01/13/15 CT   Allergy    seasonal   Anxiety 04/08/2020   Arthritis    Blister of multiple sites of lower extremity 01/10/2018   Body mass index (BMI) 34.0-34.9, adult 04/07/2020   BPH with obstruction/lower urinary tract symptoms    Overview:  Probable based on symptoms   CAD (coronary artery disease) 02/01/2018   Carotid arterial disease ([place]) 05/23/2012   Carotid artery occlusion    Chronic back pain    Cigarette smoker 09/05/2018   COLONIC POLYPS, ADENOMATOUS, HX OF 03/17/2010   Qualifier: Diagnosis of  By: Harlon Ditty CMA (AAMA), Dottie     COPD GOLD II 04/07/2019   Quit smoking 03/2019 - PFT's  04/07/2019  FEV1 1.58 (57 % ) ratio 0.54  p 0 % improvement from saba p nothing prior to study with DLCO  15.22 (66%) corrects to 3.05 (74%)  for alv volume and FV curve concave classically     Degeneration of cervical intervertebral disc    Degeneration of lumbar intervertebral disc 06/14/2015   Depression 03/17/2010   Qualifier: Diagnosis of  By: Nelson-Smith CMA (AAMA), Dottie     Diverticulosis    DIVERTICULOSIS, COLON  03/17/2010   Qualifier: Diagnosis of  By: Harlon Ditty CMA (AAMA), Dottie     Dyspnea    Exogenous obesity 11/08/2013   Overview:  With a nine pound weight gain since his last visit   Heart murmur    History of back surgery    Rods and Screws in back   History of craniotomy 01/11/2018   History of left knee replacement    History of right MCA stroke 07/21/2014   Hyperlipidemia LDL goal <70 01/19/2018   Hypertension    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 Oneida Alar and 06/10/2015 last visit   Impairment of balance 10/27/2021   Inflammation of  sacroiliac joint ([place]) 05/06/2019   Lower urinary tract symptoms (LUTS) 08/06/2017   Meningioma ([place])    Microscopic hematuria 11/08/2013   Mitral regurgitation 06/02/2021   Muscle weakness 10/27/2021   Neck pain 03/25/2019   Nocturia    OA (osteoarthritis) of knee 04/01/2012   OAB (overactive bladder) 01/19/2014   OSA (obstructive sleep apnea)    Bipap per Chodri since ? 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 MAngelena Form& Dr. BCyndia Bent  Severe aortic stenosis 02/01/2018   ECHO 01/30/18 - Left ventricle: The cavity size was normal. There was moderate   focal basal hypertrophy of the septum. Systolic function was   vigorous. The estimated ejection fraction was in the range of 65%   to 70%. Wall motion was normal; there were no regional wall   motion abnormalities. Doppler parameters are consistent with   abnormal left ventricular relaxation (grade 1 diastolic   dysfun   Status post AAA (abdominal aortic aneurysm) repair 02/01/2018   Stroke ([place] 2013   tia no residual deficit from   Tobacco use 11/08/2013   Ventral hernia 11/08/2013   Overview:  S/p repair with alloderm mesh and s/p SB resection    Past Surgical History:  Procedure Laterality Date   ABDOMINAL [place]ORTIC [place]EURYSM [place]PAIR  [place]   BACK SURGERY  2018   Rods and Screws in Back lower back   BRAIN MENINGIOMA EXCISION  1991   menigioma   CARPAL TUNNEL RELEASE     left   EYE SURGERY Bilateral    Cataract   IRRIGATION AND DEBRIDEMENT  ABSCESS Left 08/25/2020   Procedure: IRRIGATION AND DEBRIDEMENT ABSCESS LEFT LEG;  Surgeon: PEvelina Bucy DPM;  [place]  Service: Podiatry;  Laterality: Left;   JOINT REPLACEMENT Left 04-01-12   Knee   LEFT HEART CATH AND CORONARY ANGIOGRAPHY N/A 09/15/2016   Procedure: Left Heart Cath and Coronary Angiography;  Surgeon: CNelva Bush MD;  [facility];  Service: Cardiovascular;  Laterality: N/A;   MAXIMUM ACCESS (MAS)POSTERIOR LUMBAR INTERBODY FUSION (PLIF) 1 LEVEL N/A 07/07/2015   Procedure:  POSTERIOR LUMBAR INTERBODY FUSION (PLIF) Lumbar Four-Five with Pedicle Screw Fixation Lumbar Two-Five;Laminectomy Lumbar Two-Five;  Surgeon: Eustace Moore, MD;  Location: [place] [facility];  Service: Neurosurgery;  Laterality: N/A;   POSTERIOR LUMBAR INTERBODY FUSION (PLIF) Lumbar Four-Five with Pedicle Screw Fixation Lumbar Two-Five;Laminectomy Lumbar Two-Five   RIGHT/LEFT HEART CATH AND CORONARY ANGIOGRAPHY N/A 01/24/2018   Procedure: RIGHT/LEFT HEART CATH AND CORONARY ANGIOGRAPHY;  Surgeon: Nelva Bush, MD;  Location: [place] [facility];  Service: Cardiovascular;  Laterality: N/A;   RIGHT/LEFT HEART CATH AND CORONARY ANGIOGRAPHY N/A 05/31/2021   Procedure: RIGHT/LEFT HEART CATH AND CORONARY ANGIOGRAPHY;  Surgeon: Burnell Blanks, MD;  Location: [place] [facility];  Service: Cardiovascular;  Laterality: N/A;   TEE WITHOUT CARDIOVERSION N/A 06/07/2021   Procedure: TRANSESOPHAGEAL ECHOCARDIOGRAM (TEE);  Surgeon: Burnell Blanks, MD;  Location: [place];  Service: Open Heart Surgery;  Laterality: N/A;   TEE WITHOUT CARDIOVERSION N/A 12/08/2021   Procedure: TRANSESOPHAGEAL ECHOCARDIOGRAM (TEE);  Surgeon: Berniece Salines, DO;  Location: [place] [facility];  Service: Cardiovascular;  Laterality: N/A;   TONSILLECTOMY  as child   TOTAL KNEE ARTHROPLASTY  04/01/2012   Procedure: TOTAL KNEE ARTHROPLASTY;  Surgeon: Gearlean Alf, MD;  Location: [facility];  Service:  Orthopedics;  Laterality: Left;   TRANSCATHETER AORTIC VALVE REPLACEMENT, TRANSFEMORAL Bilateral 06/07/2021   Procedure: TRANSCATHETER AORTIC VALVE REPLACEMENT, RIGHT TRANSFEMORAL;  Surgeon: Burnell Blanks, MD;  Location: [place];  Service: Open Heart Surgery;  Laterality: Bilateral;   TRANSURETHRAL RESECTION OF PROSTATE     ULTRASOUND GUIDANCE FOR VASCULAR ACCESS Bilateral 06/07/2021   Procedure: ULTRASOUND GUIDANCE FOR VASCULAR ACCESS;  Surgeon: Burnell Blanks, MD;  Location: [place];  Service: Open Heart Surgery;  Laterality: Bilateral;   UMBILICAL HERNIA REPAIR  2011    Allergies  Allergen Reactions   Cefazolin Rash and Other (See Comments)    The patient had surgery and was given cefazolin intraop. ~ 10 days later he developed a rash confirmed by biopsy to be consistent w/ drug eruption. We cannot know for sure, but this is the most likely agent.      Outpatient Encounter Medications as of 02/28/2022  Medication Sig   amLODipine (NORVASC) 10 MG tablet Take 1 tablet (10 mg total) by mouth daily.   amoxicillin (AMOXIL) 500 MG capsule Take 1 capsule (500 mg total) by mouth 3 (three) times daily.   atorvastatin (LIPITOR) 40 MG tablet Take 1 tablet (40 mg total) by mouth daily.   finasteride (PROSCAR) 5 MG tablet Take 1 tablet (5 mg total) by mouth daily.   gabapentin (NEURONTIN) 300 MG capsule Take 1 capsule (300 mg total) by mouth 2 (two) times daily.   metoprolol tartrate (LOPRESSOR) 25 MG tablet Take 1 tablet (25 mg total) by mouth 2 (two) times daily.   nicotine (NICODERM CQ - DOSED IN MG/24 HOURS) 21 mg/24hr patch Place 1 patch (21 mg total) onto the skin daily.   oxyCODONE (OXY IR/ROXICODONE) 5 MG immediate release tablet Take 1 tablet (5 mg total) by mouth every 6 (six) hours as needed for severe pain.   senna-docusate (SENOKOT-S) 8.6-50 MG tablet Take 1 tablet by mouth 2 (two) times daily.   sertraline (ZOLOFT) 50 MG tablet Take 1 tablet (50 mg total) by mouth daily.    tamsulosin (FLOMAX) 0.4 MG CAPS capsule TAKE 1 CAPSULE BY MOUTH ONCE DAILY AFTER SUPPER   No facility-administered encounter medications on file as of 02/28/2022.    Review of Systems:  Review of Systems  Constitutional:  Negative for activity change, appetite change and fever.  HENT:  Negative for sore throat.   Eyes: Negative.   Cardiovascular:  Negative for chest pain and leg swelling.  Gastrointestinal:  Negative for abdominal distention, diarrhea and vomiting.  Genitourinary:  Negative for dysuria, frequency and urgency.  Musculoskeletal:  Positive for back pain and gait problem.  Skin:  Negative for color change.  Neurological:  Negative for dizziness and headaches.  Psychiatric/Behavioral:  Negative for behavioral problems and sleep disturbance. The patient is not nervous/anxious.     Health Maintenance  Topic Date Due   Zoster Vaccines- Shingrix (1 of 2) Never done   Pneumonia Vaccine 61+ Years old (2 - PCV) 05/28/2012   TETANUS/TDAP  11/03/2017   COVID-19 Vaccine (3 - Pfizer series) 03/31/2020   COLONOSCOPY (Pts 45-48yr Insurance coverage will need to be confirmed)  09/18/2021   INFLUENZA VACCINE  01/03/2022   Hepatitis C Screening  Completed   HPV VACCINES  Aged Out    Physical Exam: Vitals:   02/28/22 1347  BP: (!) 140/80  Pulse: 67  Resp: 20  Temp: 97.7 F (36.5 C)  TempSrc: Temporal  SpO2: 96%  Weight: 213 lb (96.6 kg)  Height: '5\' 7"'$  (1.702 m)   Body mass index is 33.36 kg/m. Physical Exam Constitutional:      Appearance: He is obese.  HENT:     Head: Normocephalic and atraumatic.     Mouth/Throat:     Mouth: Mucous membranes are moist.  Eyes:     Conjunctiva/sclera: Conjunctivae normal.  Cardiovascular:     Rate and Rhythm: Normal rate and regular rhythm.     Pulses: Normal pulses.     Heart sounds: Normal heart sounds.  Pulmonary:     Effort: Pulmonary effort is normal.     Breath sounds: Normal breath sounds.  Abdominal:     General:  Bowel sounds are normal.     Palpations: Abdomen is soft.  Musculoskeletal:        General: No swelling. Normal range of motion.     Cervical back: Normal range of motion.  Skin:    General: Skin is warm and dry.  Neurological:     General: No focal deficit present.     Mental Status: He is alert and oriented to person, place, and time.  Psychiatric:        Mood and Affect: Mood normal.        Behavior: Behavior normal.        Thought Content: Thought content normal.        Judgment: Judgment normal.     Labs reviewed: Basic Metabolic Panel: Recent Labs    12/07/21 0942 12/08/21 0238 12/09/21 0304 12/11/21 0134 12/12/21 0141 12/13/21 0424 12/14/21 0324  NA 137   < > 135   < > 135 132* 134*  K 4.5   < > 4.4   < > 4.9 4.6 4.6  CL 96*   < > 95*   < > 95* 100 100  CO2 36*   < > 33*   < > '31 26 26  '$ GLUCOSE 125*   < > 115*   < > 165* 114* 123*  BUN 16   < > 15   < > '19 17 18  '$ CREATININE 0.73   < > 0.72   < > 0.83 0.70 0.73  CALCIUM 8.7*   < > 8.3*   < > 8.4* 8.0* 8.3*  MG 2.3  --  2.2  --   --  2.2  --   PHOS  --   --   --   --   --  3.9  --    < > = values in this interval not displayed.   Liver Function Tests: Recent Labs    11/30/21 1000 12/01/21 0402 12/13/21 0424  AST '19 15 24  '$ ALT 19 18 34  ALKPHOS 72 68 74  BILITOT 0.8 0.8 0.6  PROT 6.4* 5.8* 5.5*  ALBUMIN 2.9* 2.4* 2.2*   No results for input(s): "LIPASE", "AMYLASE" in the last 8760 hours. No results for input(s): "AMMONIA" in the last 8760 hours. CBC: Recent Labs    12/12/21 0141 12/13/21 0424 12/14/21 0324 12/16/21 1421  WBC 10.3 8.4 7.4  --   NEUTROABS 8.1* 6.1 5.4  --   HGB 11.8* 11.6* 11.7* 11.3*  HCT 36.0* 34.0* 34.6* 32.9*  MCV 95.5 92.9 93.3  --   PLT 212 199 207  --    Lipid Panel: Recent Labs    05/31/21 0419  CHOL 159  HDL 58  LDLCALC 88  TRIG 64  CHOLHDL 2.7   Lab Results  Component Value Date   HGBA1C 5.4 12/13/2021    Procedures since last visit: ECHOCARDIOGRAM  COMPLETE  Result Date: 02/03/2022    ECHOCARDIOGRAM REPORT   Patient Name:   Kyle Lewis Date of Exam: 02/03/2022 Medical Rec #:  [id number]       Height:       68.0 in Accession #:    [id number]      Weight:       215.6 lb Date of Birth:  1946/04/29      BSA:          2.110 m Patient Age:    39 years        BP:           130/78 mmHg Patient Gender: M               HR:           60 bpm. Exam Location:  [facility] Procedure: 2D Echo, Cardiac Doppler and Color Doppler Indications:    S/P TAVR (transcatheter aortic valve replacement) [Z95.2                 (ICD-10-CM)]; Endocarditis, unspecified chronicity, unspecified                 endocarditis type [I38 (ICD-10-CM)]  History:        Patient has prior history of Echocardiogram examinations, most                 recent 12/06/2021. CHF, CAD, Aortic Valve Disease; Risk                 Factors:Hypertension and Dyslipidemia.                 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 Procedure Date: 06/07/2021.  Sonographer:    Philipp Deputy RDCS Referring Phys: Waverly Ferrari [facility]  Sonographer Comments: Suboptimal apical window. Image acquisition challenging due to respiratory motion and Image acquisition challenging due to patient body habitus. Global longitudinal strain was attempted. IMPRESSIONS  1. Left ventricular ejection fraction, by estimation, is 55 to 60%. The left ventricle has normal function. Left ventricular endocardial border not optimally  defined to evaluate regional wall motion. There is mild concentric left ventricular hypertrophy. Left ventricular diastolic parameters are consistent with Grade I diastolic dysfunction (impaired relaxation).  2. Right ventricular systolic function is normal. The right ventricular size is normal.  3. The mitral valve is normal in structure. No evidence of mitral valve regurgitation. No evidence of mitral stenosis.  4. The aortic valve is normal in  structure. Aortic valve regurgitation is not visualized. No aortic stenosis is present. 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 Procedure Date: 06/07/2021. Echo findings are consistent with normal structure and function of the aortic valve prosthesis.  5. The inferior vena cava is normal in size with greater than 50% respiratory variability, suggesting right atrial pressure of 3 mmHg. FINDINGS  Left Ventricle: Left ventricular ejection fraction, by estimation, is 55 to 60%. The left ventricle has normal function. Left ventricular endocardial border not optimally defined to evaluate regional wall motion. The left ventricular internal cavity size was normal in size. There is mild concentric left ventricular hypertrophy. Left ventricular diastolic parameters are consistent with Grade I diastolic dysfunction (impaired relaxation). Normal left ventricular filling pressure. Right Ventricle: The right ventricular size is normal. No increase in right ventricular wall thickness. Right ventricular systolic function is normal. Left Atrium: Left atrial size was normal in size. Right Atrium: Right atrial size was normal in size. Pericardium: There is no evidence of pericardial effusion. Mitral Valve: The mitral valve is normal in structure. Mild mitral annular calcification. No evidence of mitral valve regurgitation. No evidence of mitral valve stenosis. Tricuspid Valve: The tricuspid valve is normal in structure. Tricuspid valve regurgitation is not demonstrated. No evidence of tricuspid stenosis. Aortic Valve: The aortic valve is normal in structure. Aortic valve regurgitation is not visualized. No aortic stenosis is present. Aortic valve mean gradient measures 15.3 mmHg. Aortic valve peak gradient measures 27.5 mmHg. Aortic valve area, by VTI measures 1.76 cm. 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 Procedure Date: 06/07/2021. Echo findings  are consistent with normal structure and function of the aortic valve prosthesis. Pulmonic Valve: The pulmonic valve was normal in structure. Pulmonic valve regurgitation is not visualized. No evidence of pulmonic stenosis. Aorta: The aortic root and ascending aorta are structurally normal, with no evidence of dilitation and the aortic arch was not well visualized. Venous: The pulmonary veins were not well visualized. The inferior vena cava is normal in size with greater than 50% respiratory variability, suggesting right atrial pressure of 3 mmHg. IAS/Shunts: No atrial level shunt detected by color flow Doppler.  LEFT VENTRICLE PLAX 2D LVIDd:         5.50 cm   Diastology LVIDs:         3.40 cm   LV e' medial:    8.70 cm/s LV PW:         1.20 cm   LV E/e' medial:  8.4 LV IVS:        1.20 cm   LV e' lateral:   7.72 cm/s LVOT diam:     2.30 cm   LV E/e' lateral: 9.5 LV SV:         97 LV SV Index:   46 LVOT Area:     4.15 cm  RIGHT VENTRICLE RV S prime:     15.20 cm/s TAPSE (M-mode): 3.3 cm LEFT ATRIUM           Index LA diam:  2.70 cm 1.28 cm/m LA Vol (A4C): 48.2 ml 22.84 ml/m  AORTIC VALVE AV Area (Vmax):    1.55 cm AV Area (Vmean):   1.56 cm AV Area (VTI):     1.76 cm AV Vmax:           262.33 cm/s AV Vmean:          183.667 cm/s AV VTI:            0.551 m AV Peak Grad:      27.5 mmHg AV Mean Grad:      15.3 mmHg LVOT Vmax:         97.90 cm/s LVOT Vmean:        69.100 cm/s LVOT VTI:          0.233 m LVOT/AV VTI ratio: 0.42  AORTA Ao Root diam: 2.60 cm Ao Asc diam:  2.60 cm MITRAL VALVE MV Area (PHT): 2.11 cm     SHUNTS MV Decel Time: 359 msec     Systemic VTI:  0.23 m MV E velocity: 73.20 cm/s   Systemic Diam: 2.30 cm MV A velocity: 104.00 cm/s MV E/A ratio:  0.70 Shirlee More MD Electronically signed by Shirlee More MD Signature Date/Time: 02/03/2022/5:31:08 PM    Final    MR Lumbar Spine W Wo Contrast  Result Date: 01/31/2022 CLINICAL DATA:  Severe low back pain history of epidural abscess. EXAM: MRI  LUMBAR SPINE WITHOUT AND WITH CONTRAST TECHNIQUE: Multiplanar and multiecho pulse sequences of the lumbar spine were obtained without and with intravenous contrast. CONTRAST:  30m MULTIHANCE GADOBENATE DIMEGLUMINE 529 MG/ML IV SOLN COMPARISON:  CT abdomen/pelvis 01/23/2022 and lumbar spine MRI 12/10/2021 FINDINGS: Segmentation: Standard; the lowest formed disc space is designated L5-S1. Alignment: There is exaggerated lumbar lordosis with stepwise grade 1 retrolisthesis of L1 on L2 through L3 on L4 and grade 1 anterolisthesis of L4 on L5, unchanged Vertebrae: Postsurgical changes reflecting posterior instrumented fusion from L2 through L5 are again seen with an interbody spacer at L4-L5. Vertebral body heights are preserved. There is confluent T1 hypointensity along the L5-S1 disc space with faint marrow edema. The endplate signal abnormality is more pronounced compared to the study from 12/09/2021. There is enhancing material along the ventral epidural space at L5 and S1 measuring up to approximately 6 mm in thickness on the sagittal plane remains suspicious for phlegmon with a suspected abscess in the region of the right subarticular zone (9-8). Compared to the study from 12/09/2021, the overall thickness of the collection is decreased. There is facet arthropathy with small effusions bilaterally at L5-S1 without convincing evidence of septic arthritis. There is no other evidence of acute abnormality. Conus medullaris and cauda equina: Conus extends to the L1 level. Conus and cauda equina appear normal. There is no definite abnormal enhancement of the cauda equina nerve roots. Paraspinal and other soft tissues: There is mild prevertebral/presacral edema at L5 through S2. Susceptibility artifact related to prior abdominal aortic aneurysm repair is noted. Disc levels: There is advanced multilevel disc space narrowing throughout the lumbar spine. There is improved canal patency at L5-S1 compared to the prior study  due to the decreased size of the epidural collection. There is facet arthropathy at this level with bilateral effusions but no convincing evidence of septic arthritis within the confines of significant susceptibility artifact from the surrounding hardware. The neural foramina are not well assessed due to the hardware. There is no other significant spinal canal stenosis. There is no other convincing high-grade  neural foraminal stenosis. IMPRESSION: Increased T1 hypointensity with associated STIR signal abnormality along the L5-S1 disc space since 12/09/2021 is concerning for persistent discitis/osteomyelitis; however, the epidural phlegmon/abscess along the posterior L5 and S1 endplates appears decreased with improved patency of the canal at L5-S1. Electronically Signed   By: Valetta Mole M.D.   On: 01/31/2022 15:50    Assessment/Plan  1. Generalized weakness -   Ambulatory referral to [facility] - For home use only DME [place] - DME Bedside commode  2. Unstable gait -  fall precautions - For home use only DME [place] - DME Bedside commode  3. Frequent falls -  will have repeat MRI of lumbar spine on 03/13/22 - For home use only DME [place] - DME Bedside commode    Labs/tests ordered:   None  Next appt:  03/22/2022

## 2022-02-28 NOTE — Telephone Encounter (Signed)
Dorian Pod w/ [facility] called to advise Amy Fargo,NP that patient orders will not be accepted due to telehealth  visit and not face to face. She also stated that they have no staffing this week for patient due to been short staffed. Patient's wife [name]) was advised as well and states that she scheduled appointment today,02/28/22 to get another home health order placed. [name] state she will let Monina Medina-Vargas,NP know if she found another home health place for referral at patient visit. Just a [place]

## 2022-02-28 NOTE — Patient Instructions (Signed)
Weakness Weakness is a lack of strength. You may feel weak all over your body (generalized), or you may feel weak in one part of your body (focal). There are many potential causes of weakness. Sometimes, the cause of your weakness may not be known. Some causes of weakness can be serious, so it is important to see your doctor. Follow these instructions at home: Activity Rest as needed. Try to get enough sleep. Most adults need 7-8 hours of sleep each night. Talk to your doctor about how much sleep you need. Do exercises, such as arm curls and leg raises, for 30 minutes at least 2 days a week or as told by your doctor. Think about working with a physical therapist or trainer to help you get stronger. General instructions  Take over-the-counter and prescription medicines only as told by your doctor. Eat a healthy, well-balanced diet. This includes: Proteins to build muscles, such as lean meats and fish. Fresh fruits and vegetables. Carbohydrates to boost energy, such as whole grains. Drink enough fluid to keep your pee (urine) pale yellow. Keep all follow-up visits. Contact a doctor if: Your weakness does not get better or it gets worse. Your weakness affects your ability to: Think clearly. Do your normal daily activities. Get help right away if: You have sudden weakness on one side of your face or body. You have chest pain. You have trouble breathing or shortness of breath. You have problems with how you see (vision). You have trouble talking or swallowing. You have trouble standing or walking. You are light-headed or faint. These symptoms may be an emergency. Get help right away. Call 911. Do not wait to see if the symptoms will go away. Do not drive yourself to the hospital. Summary Weakness is a lack of strength. You may feel weak all over your body or just in one part of your body. There are many potential causes of weakness. Sometimes, the cause of your weakness may not be  known. Rest as needed, and try to get enough sleep. Most adults need 7-8 hours of sleep each night. Eat a healthy, well-balanced diet. This information is not intended to replace advice given to you by your health care provider. Make sure you discuss any questions you have with your health care provider. Document Revised: 04/24/2021 Document Reviewed: 04/24/2021 [employer]vier Patient Education  2023 [employer]  

## 2022-02-28 NOTE — Telephone Encounter (Signed)
Thank you  for update

## 2022-03-01 ENCOUNTER — Ambulatory Visit: Payer: Self-pay

## 2022-03-01 NOTE — Patient Outreach (Signed)
  Care Coordination   03/01/2022 Name: Kyle Lewis MRN: [id number] DOB: Dec 03, 1945   Care Coordination Outreach Attempts:  An unsuccessful telephone outreach was attempted for a scheduled appointment today.  Follow Up Plan:  Additional outreach attempts will be made to offer the patient care coordination information and services.   Encounter Outcome:  Pt. Request to Call Back  Care Coordination Interventions Activated:  No   Care Coordination Interventions:  No, not indicated    Barb Merino, RN, BSN, CCM Care Management Coordinator [facility] Phone: [phone]

## 2022-03-06 ENCOUNTER — Telehealth: Payer: Self-pay

## 2022-03-06 NOTE — Telephone Encounter (Signed)
Ellard Artis, physical therapist with [facility] called requesting verbal order for PT and home health. 2 times a week for 4 weeks then 1 time a week for 4 weeks.  I gave him verbal order

## 2022-03-07 NOTE — Progress Notes (Incomplete)
[facility] clinic  Provider:  Durenda Age DNP  Code Status:  Full Code  Goals of Care:     02/20/2022    1:30 PM  Advanced Directives  Does Patient Have a Medical Advance Directive? No  Does patient want to make changes to medical advance directive? No - Patient declined     Chief Complaint  Patient presents with  . Medical Management of Chronic Issues    Patient is here for a referral for home health    HPI: Patient is a 76 y.o. male seen today for an acute visit for referral to [place]. He was recently hospitalized 11/30/21 to 12/16/21 for community-acquired pneumonia, COPD exacerbation with associated acute hypoxic respiratory failure.  Hospitalization course was complicated by Gemella Sanguinous bacteremia with source suspected to be due to odontogenic from poor dentition.  CT maxillofacial showed no dental abscess.  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  He had short-term rehabilitation at [facility] for 1.5 months and was discharged on 02/10/22. He is accompanied today by his wife. He is requesting to have Home health referral for Home health aide to assist him at home with ADLs.  Past Medical History:  Diagnosis Date  . 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 Abnormal gait 07/31/2019  . Abscess of left leg   . Acute on chronic diastolic heart failure ([place])   . Acute respiratory failure with hypoxia and hypercapnia (HCC) 05/27/2021   HC03   06/14/21    =  31  - 06/23/2021   Walked 250 ft  at a slow pace with a cane. Complained of back and left knee hurting with lowest sat 89% so d/c'd 02   . Adenomatous colon polyp   . Adrenal mass ([place])    2 benign appearing left adrenal adenomas  noted on 01/13/15 CT  . Allergy    seasonal  . Anxiety 04/08/2020  . Arthritis   . Blister of multiple sites of lower extremity 01/10/2018  . Body mass index (BMI) 34.0-34.9, adult 04/07/2020  . BPH with obstruction/lower urinary tract symptoms    Overview:  Probable based on symptoms  . CAD (coronary artery disease) 02/01/2018  . Carotid arterial disease ([place]) 05/23/2012  . Carotid artery occlusion   . Chronic back pain   . Cigarette smoker 09/05/2018  . COLONIC POLYPS, ADENOMATOUS, HX OF 03/17/2010   Qualifier: Diagnosis of  By: Nelson-Smith CMA (AAMA), Dottie    . COPD GOLD II 04/07/2019   Quit smoking 03/2019 - PFT's  04/07/2019  FEV1 1.58 (57 % ) ratio 0.54  p 0 % improvement from saba p nothing prior to study with DLCO  15.22 (66%) corrects to 3.05 (74%)  for alv volume and FV curve concave classically    . Degeneration of cervical intervertebral disc   . Degeneration of lumbar intervertebral disc 06/14/2015  . Depression 03/17/2010   Qualifier: Diagnosis of  By: Nelson-Smith CMA (AAMA), Dottie    . Diverticulosis   . DIVERTICULOSIS, COLON 03/17/2010   Qualifier: Diagnosis of  By: Nelson-Smith CMA (AAMA), Dottie    . Dyspnea   . Exogenous obesity 11/08/2013   Overview:  With a nine pound weight gain since his last visit  . Heart murmur   .  History of back surgery    Rods and Screws in back  . History of craniotomy 01/11/2018  . History of left knee replacement   . History of right MCA stroke 07/21/2014  . Hyperlipidemia LDL goal <70 01/19/2018  . Hypertension   . 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  . Iliac artery aneurysm, left ([place]) 07/01/2015   Overview:  Follow-up to Dr. Oneida Alar and 06/10/2015 last visit  . Impairment of balance 10/27/2021  . Inflammation of sacroiliac joint ([place]) 05/06/2019  . Lower urinary tract symptoms (LUTS) 08/06/2017  . Meningioma ([place])   . Microscopic hematuria 11/08/2013  . Mitral regurgitation 06/02/2021  . Muscle weakness 10/27/2021   . Neck pain 03/25/2019  . Nocturia   . OA (osteoarthritis) of knee 04/01/2012  . OAB (overactive bladder) 01/19/2014  . OSA (obstructive sleep apnea)    Bipap per Chodri since ? 2018 - Download 09/05/2018 used > 4 h x > 92% of days and avg use 8 h 33mn with AHI 3.1 @ 6 ipap and 10 epap   . Osteoarthritis of right glenohumeral joint 08/14/2017  . Other specified postprocedural states 09/25/2017  . Peripheral vascular disease ([place]   . Pre-procedure lab exam 01/19/2018  . Preop cardiovascular exam 10/24/2018  . Ringing in ear    ([place])  . S/P lumbar spinal fusion 07/07/2015  . 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 MAngelena Form& Dr. BCyndia Bent . Severe aortic stenosis 02/01/2018   ECHO 01/30/18 - Left ventricle: The cavity size was normal. There was moderate   focal basal hypertrophy of the septum. Systolic function was   vigorous. The estimated ejection fraction was in the range of 65%   to 70%. Wall motion was normal; there were no regional wall   motion abnormalities. Doppler parameters are consistent with   abnormal left ventricular relaxation (grade 1 diastolic   dysfun  . Status post AAA (abdominal aortic aneurysm) repair 02/01/2018  . Stroke ([facility] 2013   tia no residual deficit from  . Tobacco use 11/08/2013  . Ventral hernia 11/08/2013   Overview:  S/p repair with alloderm mesh and s/p SB resection    Past Surgical History:  Procedure Laterality Date  . ABDOMINAL AORTIC ANEURYSM REPAIR  2019   [place]  . APPENDECTOMY  as child  . BACK SURGERY  2018   Rods and Screws in Back lower back  . BRAIN MENINGIOMA EXCISION  1991   menigioma  . CARPAL TUNNEL RELEASE     left  . EYE SURGERY Bilateral    Cataract  . IRRIGATION AND DEBRIDEMENT ABSCESS Left 08/25/2020   Procedure: IRRIGATION AND DEBRIDEMENT ABSCESS LEFT LEG;  Surgeon: PEvelina Bucy DPM;  [place]  Service: Podiatry;   Laterality: Left;  . JOINT REPLACEMENT Left 04-01-12   Knee  . LEFT HEART CATH AND CORONARY ANGIOGRAPHY N/A 09/15/2016   Procedure: Left Heart Cath and Coronary Angiography;  Surgeon: CNelva Bush MD;  [place] [facility];  Service: Cardiovascular;  Laterality: N/A;  . MAXIMUM ACCESS (MAS)POSTERIOR LUMBAR INTERBODY FUSION (PLIF) 1 LEVEL N/A 07/07/2015   Procedure:  POSTERIOR LUMBAR INTERBODY FUSION (PLIF) Lumbar Four-Five with Pedicle Screw Fixation Lumbar Two-Five;Laminectomy Lumbar Two-Five;  Surgeon: DEustace Moore MD;  [place];  Service: Neurosurgery;  Laterality: N/A;   POSTERIOR LUMBAR INTERBODY FUSION (PLIF) Lumbar Four-Five with Pedicle  Screw Fixation Lumbar Two-Five;Laminectomy Lumbar Two-Five  . RIGHT/LEFT HEART CATH AND CORONARY ANGIOGRAPHY N/A 01/24/2018   Procedure: RIGHT/LEFT HEART CATH AND CORONARY ANGIOGRAPHY;  Surgeon: Nelva Bush, MD;  Location: [place] [facility] LAB;  Service: Cardiovascular;  Laterality: N/A;  . RIGHT/LEFT HEART CATH AND CORONARY ANGIOGRAPHY N/A 05/31/2021   Procedure: RIGHT/LEFT HEART CATH AND CORONARY ANGIOGRAPHY;  Surgeon: Burnell Blanks, MD;  Location: [place] [facility];  Service: Cardiovascular;  Laterality: N/A;  . TEE WITHOUT CARDIOVERSION N/A 06/07/2021   Procedure: TRANSESOPHAGEAL ECHOCARDIOGRAM (TEE);  Surgeon: Burnell Blanks, MD;  Location: [place];  Service: Open Heart Surgery;  Laterality: N/A;  . TEE WITHOUT CARDIOVERSION N/A 12/08/2021   Procedure: TRANSESOPHAGEAL ECHOCARDIOGRAM (TEE);  Surgeon: Berniece Salines, DO;  [facility];  Service: Cardiovascular;  Laterality: N/A;  . TONSILLECTOMY  as child  . TOTAL KNEE ARTHROPLASTY  04/01/2012   Procedure: TOTAL KNEE ARTHROPLASTY;  Surgeon: Gearlean Alf, MD;  Location: [facility];  Service: Orthopedics;  Laterality: Left;  . TRANSCATHETER AORTIC VALVE REPLACEMENT, TRANSFEMORAL Bilateral 06/07/2021   Procedure: TRANSCATHETER AORTIC VALVE REPLACEMENT, RIGHT  TRANSFEMORAL;  Surgeon: Burnell Blanks, MD;  Location: [place];  Service: Open Heart Surgery;  Laterality: Bilateral;  . TRANSURETHRAL RESECTION OF PROSTATE    . ULTRASOUND GUIDANCE FOR VASCULAR ACCESS Bilateral 06/07/2021   Procedure: ULTRASOUND GUIDANCE FOR VASCULAR ACCESS;  Surgeon: Burnell Blanks, MD;  Location: [place];  Service: Open Heart Surgery;  Laterality: Bilateral;  . UMBILICAL HERNIA REPAIR  2011    Allergies  Allergen Reactions  . Cefazolin Rash and Other (See Comments)    The patient had surgery and was given cefazolin intraop. ~ 10 days later he developed a rash confirmed by biopsy to be consistent w/ drug eruption. We cannot know for sure, but this is the most likely agent.      Outpatient Encounter Medications as of 02/28/2022  Medication Sig  . amLODipine (NORVASC) 10 MG tablet Take 1 tablet (10 mg total) by mouth daily.  Marland Kitchen amoxicillin (AMOXIL) 500 MG capsule Take 1 capsule (500 mg total) by mouth 3 (three) times daily.  Marland Kitchen atorvastatin (LIPITOR) 40 MG tablet Take 1 tablet (40 mg total) by mouth daily.  . finasteride (PROSCAR) 5 MG tablet Take 1 tablet (5 mg total) by mouth daily.  Marland Kitchen gabapentin (NEURONTIN) 300 MG capsule Take 1 capsule (300 mg total) by mouth 2 (two) times daily.  . metoprolol tartrate (LOPRESSOR) 25 MG tablet Take 1 tablet (25 mg total) by mouth 2 (two) times daily.  . nicotine (NICODERM CQ - DOSED IN MG/24 HOURS) 21 mg/24hr patch Place 1 patch (21 mg total) onto the skin daily.  Marland Kitchen oxyCODONE (OXY IR/ROXICODONE) 5 MG immediate release tablet Take 1 tablet (5 mg total) by mouth every 6 (six) hours as needed for severe pain.  Marland Kitchen senna-docusate (SENOKOT-S) 8.6-50 MG tablet Take 1 tablet by mouth 2 (two) times daily.  . sertraline (ZOLOFT) 50 MG tablet Take 1 tablet (50 mg total) by mouth daily.  . tamsulosin (FLOMAX) 0.4 MG CAPS capsule TAKE 1 CAPSULE BY MOUTH ONCE DAILY AFTER SUPPER   No facility-administered encounter medications on file as of  02/28/2022.    Review of Systems:  Review of Systems  Constitutional:  Negative for activity change, appetite change and fever.  HENT:  Negative for sore throat.   Eyes: Negative.   Cardiovascular:  Negative for chest pain and leg swelling.  Gastrointestinal:  Negative for abdominal distention, diarrhea and vomiting.  Genitourinary:  Negative for dysuria, frequency and urgency.  Skin:  Negative for color change.  Neurological:  Negative for dizziness and headaches.  Psychiatric/Behavioral:  Negative for behavioral problems and sleep disturbance. The patient is not nervous/anxious.     Health Maintenance  Topic Date Due  . Zoster Vaccines- Shingrix (1 of 2) Never done  . Pneumonia Vaccine 30+ Years old (2 - PCV) 05/28/2012  . TETANUS/TDAP  11/03/2017  . COVID-19 Vaccine (3 - Pfizer series) 03/31/2020  . COLONOSCOPY (Pts 45-83yr Insurance coverage will need to be confirmed)  09/18/2021  . INFLUENZA VACCINE  01/03/2022  . Hepatitis C Screening  Completed  . HPV VACCINES  Aged Out    Physical Exam: Vitals:   02/28/22 1347  BP: (!) 140/80  Pulse: 67  Resp: 20  Temp: 97.7 F (36.5 C)  TempSrc: Temporal  SpO2: 96%  Weight: 213 lb (96.6 kg)  Height: '5\' 7"'$  (1.702 m)   Body mass index is 33.36 kg/m. Physical Exam Constitutional:      Appearance: Normal appearance.  HENT:     Head: Normocephalic and atraumatic.     Mouth/Throat:     Mouth: Mucous membranes are moist.  Eyes:     Conjunctiva/sclera: Conjunctivae normal.  Cardiovascular:     Rate and Rhythm: Normal rate and regular rhythm.     Pulses: Normal pulses.     Heart sounds: Normal heart sounds.  Pulmonary:     Effort: Pulmonary effort is normal.     Breath sounds: Normal breath sounds.  Abdominal:     General: Bowel sounds are normal.     Palpations: Abdomen is soft.  Musculoskeletal:        General: No swelling. Normal range of motion.     Cervical back: Normal range of motion.  Skin:    General: Skin is  warm and dry.  Neurological:     General: No focal deficit present.     Mental Status: He is alert and oriented to person, place, and time.  Psychiatric:        Mood and Affect: Mood normal.        Behavior: Behavior normal.        Thought Content: Thought content normal.        Judgment: Judgment normal.     Labs reviewed: Basic Metabolic Panel: Recent Labs    12/07/21 0942 12/08/21 0238 12/09/21 0304 12/11/21 0134 12/12/21 0141 12/13/21 0424 12/14/21 0324  NA 137   < > 135   < > 135 132* 134*  K 4.5   < > 4.4   < > 4.9 4.6 4.6  CL 96*   < > 95*   < > 95* 100 100  CO2 36*   < > 33*   < > '31 26 26  '$ GLUCOSE 125*   < > 115*   < > 165* 114* 123*  BUN 16   < > 15   < > '19 17 18  '$ CREATININE 0.73   < > 0.72   < > 0.83 0.70 0.73  CALCIUM 8.7*   < > 8.3*   < > 8.4* 8.0* 8.3*  MG 2.3  --  2.2  --   --  2.2  --   PHOS  --   --   --   --   --  3.9  --    < > = values in this interval not displayed.   Liver Function Tests: Recent Labs  11/30/21 1000 12/01/21 0402 12/13/21 0424  AST '19 15 24  '$ ALT 19 18 34  ALKPHOS 72 68 74  BILITOT 0.8 0.8 0.6  PROT 6.4* 5.8* 5.5*  ALBUMIN 2.9* 2.4* 2.2*   No results for input(s): "LIPASE", "AMYLASE" in the last 8760 hours. No results for input(s): "AMMONIA" in the last 8760 hours. CBC: Recent Labs    12/12/21 0141 12/13/21 0424 12/14/21 0324 12/16/21 1421  WBC 10.3 8.4 7.4  --   NEUTROABS 8.1* 6.1 5.4  --   HGB 11.8* 11.6* 11.7* 11.3*  HCT 36.0* 34.0* 34.6* 32.9*  MCV 95.5 92.9 93.3  --   PLT 212 199 207  --    Lipid Panel: Recent Labs    05/31/21 0419  CHOL 159  HDL 58  LDLCALC 88  TRIG 64  CHOLHDL 2.7   Lab Results  Component Value Date   HGBA1C 5.4 12/13/2021    Procedures since last visit: ECHOCARDIOGRAM COMPLETE  Result Date: 02/03/2022    ECHOCARDIOGRAM REPORT   Patient Name:   DENARD TUMINELLO Date of Exam: 02/03/2022 Medical Rec #:  [id number]       Height:       68.0 in Accession #:    [id number]       Weight:       215.6 lb Date of Birth:  28-Jan-1946      BSA:          2.110 m Patient Age:    56 years        BP:           130/78 mmHg Patient Gender: M               HR:           60 bpm. Exam Location:  [place] Procedure: 2D Echo, Cardiac Doppler and Color Doppler Indications:    S/P TAVR (transcatheter aortic valve replacement) [Z95.2                 (ICD-10-CM)]; Endocarditis, unspecified chronicity, unspecified                 endocarditis type [I38 (ICD-10-CM)]  History:        Patient has prior history of Echocardiogram examinations, most                 recent 12/06/2021. CHF, CAD, Aortic Valve Disease; Risk                 Factors:Hypertension and Dyslipidemia.                 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 Procedure Date: 06/07/2021.  Sonographer:    Philipp Deputy RDCS Referring Phys: Waverly Ferrari [facility]  Sonographer Comments: Suboptimal apical window. Image acquisition challenging due to respiratory motion and Image acquisition challenging due to patient body habitus. Global longitudinal strain was attempted. IMPRESSIONS  1. Left ventricular ejection fraction, by estimation, is 55 to 60%. The left ventricle has normal function. Left ventricular endocardial border not optimally defined to evaluate regional wall motion. There is mild concentric left ventricular hypertrophy. Left ventricular diastolic parameters are consistent with Grade I diastolic dysfunction (impaired relaxation).  2. Right ventricular systolic function is normal. The right ventricular size is normal.  3. The mitral valve is normal in structure. No evidence of mitral  valve regurgitation. No evidence of mitral stenosis.  4. The aortic valve is normal in structure. Aortic valve regurgitation is not visualized. No aortic stenosis is present. 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 Procedure Date: 06/07/2021. Echo findings  are consistent with normal structure and function of the aortic valve prosthesis.  5. The inferior vena cava is normal in size with greater than 50% respiratory variability, suggesting right atrial pressure of 3 mmHg. FINDINGS  Left Ventricle: Left ventricular ejection fraction, by estimation, is 55 to 60%. The left ventricle has normal function. Left ventricular endocardial border not optimally defined to evaluate regional wall motion. The left ventricular internal cavity size was normal in size. There is mild concentric left ventricular hypertrophy. Left ventricular diastolic parameters are consistent with Grade I diastolic dysfunction (impaired relaxation). Normal left ventricular filling pressure. Right Ventricle: The right ventricular size is normal. No increase in right ventricular wall thickness. Right ventricular systolic function is normal. Left Atrium: Left atrial size was normal in size. Right Atrium: Right atrial size was normal in size. Pericardium: There is no evidence of pericardial effusion. Mitral Valve: The mitral valve is normal in structure. Mild mitral annular calcification. No evidence of mitral valve regurgitation. No evidence of mitral valve stenosis. Tricuspid Valve: The tricuspid valve is normal in structure. Tricuspid valve regurgitation is not demonstrated. No evidence of tricuspid stenosis. Aortic Valve: The aortic valve is normal in structure. Aortic valve regurgitation is not visualized. No aortic stenosis is present. Aortic valve mean gradient measures 15.3 mmHg. Aortic valve peak gradient measures 27.5 mmHg. Aortic valve area, by VTI measures 1.76 cm. 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 Procedure Date: 06/07/2021. Echo findings are consistent with normal structure and function of the aortic valve prosthesis. Pulmonic Valve: The pulmonic valve was normal in structure. Pulmonic valve regurgitation is not visualized. No evidence of  pulmonic stenosis. Aorta: The aortic root and ascending aorta are structurally normal, with no evidence of dilitation and the aortic arch was not well visualized. Venous: The pulmonary veins were not well visualized. The inferior vena cava is normal in size with greater than 50% respiratory variability, suggesting right atrial pressure of 3 mmHg. IAS/Shunts: No atrial level shunt detected by color flow Doppler.  LEFT VENTRICLE PLAX 2D LVIDd:         5.50 cm   Diastology LVIDs:         3.40 cm   LV e' medial:    8.70 cm/s LV PW:         1.20 cm   LV E/e' medial:  8.4 LV IVS:        1.20 cm   LV e' lateral:   7.72 cm/s LVOT diam:     2.30 cm   LV E/e' lateral: 9.5 LV SV:         97 LV SV Index:   46 LVOT Area:     4.15 cm  RIGHT VENTRICLE RV S prime:     15.20 cm/s TAPSE (M-mode): 3.3 cm LEFT ATRIUM           Index LA diam:      2.70 cm 1.28 cm/m LA Vol (A4C): 48.2 ml 22.84 ml/m  AORTIC VALVE AV Area (Vmax):    1.55 cm AV Area (Vmean):   1.56 cm AV Area (VTI):     1.76 cm AV Vmax:  262.33 cm/s AV Vmean:          183.667 cm/s AV VTI:            0.551 m AV Peak Grad:      27.5 mmHg AV Mean Grad:      15.3 mmHg LVOT Vmax:         97.90 cm/s LVOT Vmean:        69.100 cm/s LVOT VTI:          0.233 m LVOT/AV VTI ratio: 0.42  AORTA Ao Root diam: 2.60 cm Ao Asc diam:  2.60 cm MITRAL VALVE MV Area (PHT): 2.11 cm     SHUNTS MV Decel Time: 359 msec     Systemic VTI:  0.23 m MV E velocity: 73.20 cm/s   Systemic Diam: 2.30 cm MV A velocity: 104.00 cm/s MV E/A ratio:  0.70 Shirlee More MD Electronically signed by Shirlee More MD Signature Date/Time: 02/03/2022/5:31:08 PM    Final    MR Lumbar Spine W Wo Contrast  Result Date: 01/31/2022 CLINICAL DATA:  Severe low back pain history of epidural abscess. EXAM: MRI LUMBAR SPINE WITHOUT AND WITH CONTRAST TECHNIQUE: Multiplanar and multiecho pulse sequences of the lumbar spine were obtained without and with intravenous contrast. CONTRAST:  10m MULTIHANCE GADOBENATE  DIMEGLUMINE 529 MG/ML IV SOLN COMPARISON:  CT abdomen/pelvis 01/23/2022 and lumbar spine MRI 12/10/2021 FINDINGS: Segmentation: Standard; the lowest formed disc space is designated L5-S1. Alignment: There is exaggerated lumbar lordosis with stepwise grade 1 retrolisthesis of L1 on L2 through L3 on L4 and grade 1 anterolisthesis of L4 on L5, unchanged Vertebrae: Postsurgical changes reflecting posterior instrumented fusion from L2 through L5 are again seen with an interbody spacer at L4-L5. Vertebral body heights are preserved. There is confluent T1 hypointensity along the L5-S1 disc space with faint marrow edema. The endplate signal abnormality is more pronounced compared to the study from 12/09/2021. There is enhancing material along the ventral epidural space at L5 and S1 measuring up to approximately 6 mm in thickness on the sagittal plane remains suspicious for phlegmon with a suspected abscess in the region of the right subarticular zone (9-8). Compared to the study from 12/09/2021, the overall thickness of the collection is decreased. There is facet arthropathy with small effusions bilaterally at L5-S1 without convincing evidence of septic arthritis. There is no other evidence of acute abnormality. Conus medullaris and cauda equina: Conus extends to the L1 level. Conus and cauda equina appear normal. There is no definite abnormal enhancement of the cauda equina nerve roots. Paraspinal and other soft tissues: There is mild prevertebral/presacral edema at L5 through S2. Susceptibility artifact related to prior abdominal aortic aneurysm repair is noted. Disc levels: There is advanced multilevel disc space narrowing throughout the lumbar spine. There is improved canal patency at L5-S1 compared to the prior study due to the decreased size of the epidural collection. There is facet arthropathy at this level with bilateral effusions but no convincing evidence of septic arthritis within the confines of significant  susceptibility artifact from the surrounding hardware. The neural foramina are not well assessed due to the hardware. There is no other significant spinal canal stenosis. There is no other convincing high-grade neural foraminal stenosis. IMPRESSION: Increased T1 hypointensity with associated STIR signal abnormality along the L5-S1 disc space since 12/09/2021 is concerning for persistent discitis/osteomyelitis; however, the epidural phlegmon/abscess along the posterior L5 and S1 endplates appears decreased with improved patency of the canal at L5-S1. Electronically Signed   By:  Valetta Mole M.D.   On: 01/31/2022 15:50    Assessment/Plan     Labs/tests ordered:  * No order type specified * Next appt:  03/22/2022

## 2022-03-08 ENCOUNTER — Ambulatory Visit: Payer: Self-pay

## 2022-03-08 NOTE — Patient Outreach (Signed)
  Care Coordination   03/08/2022 Name: Kyle Lewis MRN: [id number] DOB: 1945/10/31   Care Coordination Outreach Attempts:  An unsuccessful telephone outreach was attempted for a scheduled appointment today.  Follow Up Plan:  Additional outreach attempts will be made to offer the patient care coordination information and services.   Encounter Outcome:  No Answer  Care Coordination Interventions Activated:  No   Care Coordination Interventions:  No, not indicated    Barb Merino, RN, BSN, CCM Care Management Coordinator [facility] Phone: [phone]

## 2022-03-13 ENCOUNTER — Ambulatory Visit
Admission: RE | Admit: 2022-03-13 | Discharge: 2022-03-13 | Disposition: A | Discharge status: Home / Self Care | Payer: Medicare Other | Source: Ambulatory Visit | Attending: Neurological Surgery | Admitting: Neurological Surgery

## 2022-03-13 DIAGNOSIS — M4647 Discitis, unspecified, lumbosacral region: Secondary | ICD-10-CM

## 2022-03-13 MED ORDER — GADOBENATE DIMEGLUMINE 529 MG/ML IV SOLN
15.0000 mL | Freq: Once | INTRAVENOUS | Status: AC | PRN
  Administered 2022-03-13: 15 mL via INTRAVENOUS

## 2022-03-15 ENCOUNTER — Other Ambulatory Visit: Payer: Self-pay

## 2022-03-15 MED ORDER — AMLODIPINE BESYLATE 10 MG PO TABS: 10.0000 mg | ORAL_TABLET | Freq: Every day | ORAL | 1 refills | Status: DC

## 2022-03-17 ENCOUNTER — Ambulatory Visit (INDEPENDENT_AMBULATORY_CARE_PROVIDER_SITE_OTHER): Payer: Medicare Other | Admitting: Podiatry

## 2022-03-17 DIAGNOSIS — B351 Tinea unguium: Secondary | ICD-10-CM | POA: Diagnosis not present

## 2022-03-17 DIAGNOSIS — M2041 Other hammer toe(s) (acquired), right foot: Secondary | ICD-10-CM

## 2022-03-17 DIAGNOSIS — D689 Coagulation defect, unspecified: Secondary | ICD-10-CM

## 2022-03-17 DIAGNOSIS — M79674 Pain in right toe(s): Secondary | ICD-10-CM

## 2022-03-17 DIAGNOSIS — M79675 Pain in left toe(s): Secondary | ICD-10-CM

## 2022-03-17 NOTE — Progress Notes (Signed)
  Subjective:  Patient ID: Kyle Lewis, male    DOB: Jun 30, 1945,  MRN: [id number]  Chief Complaint  Patient presents with   Nail Problem    DFC , possible fungus .    Toe Pain    Pt states 2 toe on right foot is crossing into 1st toe     76 y.o. male presents with the above complaint. History confirmed with patient. Patient presenting with pain related to dystrophic thickened elongated nails. Patient is unable to trim own nails related to nail dystrophy and/or mobility issues. Patient does not have a history of T2DM. Also reports 2nd toe deformity right foot and interested in toe spacer device to separate 2nd toe from hallux.   Objective:  Physical Exam: warm, good capillary refill, DP and PT pulses 2/4 bilateral nail exam onychomycosis of the toenails, onycholysis, and dystrophic nails DP pulses palpable, PT pulses palpable, and protective sensation intact Left Foot:  Pain with palpation of nails due to elongation and dystrophic growth.  Right Foot: Pain with palpation of nails due to elongation and dystrophic growth. Rigid 2nd hammertoe deformity with 2nd toe medially deviated and abutting the lateral right hallux.   Assessment:   1. Pain due to onychomycosis of toenails of both feet   2. Hammertoe of right foot   3. Coagulation defect ([place])   4. Onychomycosis      Plan:  Patient was evaluated and treated and all questions answered.  #Hammertoe of right foot - Recommend gel toe spacer in between right hallux and 2nd toe.   #Onychomycosis with pain  -Nails palliatively debrided as below. -Educated on self-care  Procedure: Nail Debridement Rationale: Pain Type of Debridement: manual, sharp debridement. Instrumentation: Nail nipper, rotary burr. Number of Nails: 10  Return in about 3 months (around 06/17/2022) for RFC.         Everitt Amber, DPM [facility]

## 2022-03-21 ENCOUNTER — Other Ambulatory Visit: Payer: Self-pay

## 2022-03-21 MED ORDER — AMLODIPINE BESYLATE 10 MG PO TABS: 10.0000 mg | ORAL_TABLET | Freq: Every day | ORAL | 1 refills | Status: DC

## 2022-03-21 MED ORDER — AMLODIPINE BESYLATE 10 MG PO TABS: 10.0000 mg | ORAL_TABLET | Freq: Every day | ORAL | 1 refills | Status: AC

## 2022-03-21 NOTE — Addendum Note (Signed)
Addended by: Heriberto Antigua E on: 03/21/2022 11:12 AM   Modules accepted: Orders

## 2022-03-22 ENCOUNTER — Ambulatory Visit: Payer: Medicare Other | Admitting: Family Medicine

## 2022-03-23 ENCOUNTER — Emergency Department (HOSPITAL_COMMUNITY)
Admission: EM | Admit: 2022-03-23 | Discharge: 2022-03-23 | Discharge status: Against Medical Advice | Payer: Medicare Other | Attending: Emergency Medicine | Admitting: Emergency Medicine

## 2022-03-23 ENCOUNTER — Emergency Department (HOSPITAL_COMMUNITY): Payer: Medicare Other

## 2022-03-23 ENCOUNTER — Encounter (HOSPITAL_COMMUNITY): Payer: Self-pay | Admitting: Emergency Medicine

## 2022-03-23 DIAGNOSIS — R062 Wheezing: Secondary | ICD-10-CM | POA: Diagnosis not present

## 2022-03-23 DIAGNOSIS — J449 Chronic obstructive pulmonary disease, unspecified: Secondary | ICD-10-CM | POA: Insufficient documentation

## 2022-03-23 DIAGNOSIS — Z5321 Procedure and treatment not carried out due to patient leaving prior to being seen by health care provider: Secondary | ICD-10-CM | POA: Insufficient documentation

## 2022-03-23 DIAGNOSIS — R0602 Shortness of breath: Secondary | ICD-10-CM | POA: Insufficient documentation

## 2022-03-23 LAB — COMPREHENSIVE METABOLIC PANEL
ALT: 16 U/L (ref 0–44)
AST: 17 U/L (ref 15–41)
Albumin: 3.8 g/dL (ref 3.5–5.0)
Alkaline Phosphatase: 100 U/L (ref 38–126)
Anion gap: 9 (ref 5–15)
BUN: 21 mg/dL (ref 8–23)
CO2: 27 mmol/L (ref 22–32)
Calcium: 9.1 mg/dL (ref 8.9–10.3)
Chloride: 104 mmol/L (ref 98–111)
Creatinine, Ser: 0.8 mg/dL (ref 0.61–1.24)
GFR, Estimated: >60 mL/min (ref >60)
Glucose, Bld: 141 mg/dL — ABNORMAL HIGH (ref 70–99)
Potassium: 4.2 mmol/L (ref 3.5–5.1)
Sodium: 140 mmol/L (ref 135–145)
Total Bilirubin: 0.8 mg/dL (ref 0.3–1.2)
Total Protein: 6.6 g/dL (ref 6.5–8.1)

## 2022-03-23 LAB — CBC WITH DIFFERENTIAL/PLATELET
Abs Immature Granulocytes: 0.03 10*3/uL (ref 0.00–0.07)
Basophils Absolute: 0 10*3/uL (ref 0.0–0.1)
Basophils Relative: 0 %
Eosinophils Absolute: 0.4 10*3/uL (ref 0.0–0.5)
Eosinophils Relative: 6 %
HCT: 43.4 % (ref 39.0–52.0)
Hemoglobin: 13.9 g/dL (ref 13.0–17.0)
Immature Granulocytes: 0 %
Lymphocytes Relative: 12 %
Lymphs Abs: 0.8 10*3/uL (ref 0.7–4.0)
MCH: 30.4 pg (ref 26.0–34.0)
MCHC: 32 g/dL (ref 30.0–36.0)
MCV: 95 fL (ref 80.0–100.0)
Monocytes Absolute: 0.3 10*3/uL (ref 0.1–1.0)
Monocytes Relative: 4 %
Neutro Abs: 5.4 10*3/uL (ref 1.7–7.7)
Neutrophils Relative %: 78 %
Platelets: 196 10*3/uL (ref 150–400)
RBC: 4.57 MIL/uL (ref 4.22–5.81)
RDW: 13.9 % (ref 11.5–15.5)
WBC: 6.9 10*3/uL (ref 4.0–10.5)
nRBC: 0 % (ref 0.0–0.2)

## 2022-03-23 LAB — BRAIN NATRIURETIC PEPTIDE: B Natriuretic Peptide: 59.3 pg/mL (ref 0.0–100.0)

## 2022-03-23 NOTE — ED Provider Triage Note (Signed)
Emergency Medicine Provider Triage Evaluation Note  Kyle Lewis , a 76 y.o. male  was evaluated in triage.  Pt complains of shortness of breath which been worsening for 4 to 5 days.  Patient with underlying COPD.  Denies any fevers, chest pain.  Complains of worsening wheezing and shortness of breath.  EMS administered 125 mg of Solu-Medrol and albuterol with little relief.  Review of Systems  Positive: As above Negative: As above  Physical Exam  BP (!) 158/93 (BP Location: Right Arm)   Pulse 73   Temp 98 F (36.7 C) (Oral)   Resp (!) 22   Ht '5\' 7"'$  (1.702 m)   Wt 96 kg   SpO2 97%   BMI 33.15 kg/m  Gen:   Awake, no distress Resp:  Normal effort, inspiratory and expiratory wheezes in all fields MSK:   Moves extremities without difficulty  Other:    Medical Decision Making  Medically screening exam initiated at 2:03 PM.  Appropriate orders placed.  Kyle Lewis was informed that the remainder of the evaluation will be completed by another provider, this initial triage assessment does not replace that evaluation, and the importance of remaining in the ED until their evaluation is complete.     Dorothyann Peng, PA-C 03/23/22 1405

## 2022-03-23 NOTE — ED Triage Notes (Signed)
Pt arrives via EMS from home with reports of SOB, RA 87%. Hx of COPD. Endorses productive cough. EMS gave albuterol tx, 125 mg solu-medrol.

## 2022-03-23 NOTE — ED Notes (Signed)
Pt stated that the wait is too long and they are going to go somewhere else. Pt seen leaving the ED with family.

## 2022-03-24 ENCOUNTER — Telehealth: Payer: Self-pay

## 2022-03-24 ENCOUNTER — Ambulatory Visit: Payer: Self-pay

## 2022-03-24 NOTE — Patient Outreach (Addendum)
  Care Coordination   Follow Up Visit Note   03/24/2022 Name: Kyle Lewis MRN: [id number] DOB: February 23, 1946  COLEMAN KALAS is a 76 y.o. year old male who sees [name], MD for primary care. I spoke with wife Kyle Lewis and patient  Kyle Lewis by phone today.  What matters to the patients health and wellness today?  Patient experienced and ED visit for shortness of breath on 03/23/22. He left without being seen.     Goals Addressed             This Visit's Progress    He is spitting up clear sputum and his lungs do not sound good       Care Coordination Interventions: Determined patient was directed to go to the ED on 03/23/22 by the home health nurse but left before completing his visit due to the extended wait Provided instruction about proper use of medications used for management of COPD including inhalers Reviewed medications with patient and discussed importance of medication adherence Instructed wife Kyle Lewis and patient about the importance of using an incentive spirometer hourly to help expand lungs and promote coughing Determined wife Kyle Lewis reported patient's symptoms to PCP office staff, they will provide further instructions following review of the chest xray taken during the ED visit on 03/23/22 Determined patient is now using his BIPAP and his breathing has improved        We need help with bathing and respite care       Care Coordination Interventions: Determined patient continues to have care needs due to his wife has physical limitations related to her health Determined patient was unable to obtain a transport chair  Active listening / Reflection utilized  Emotional Support Provided Problem [place] strategies reviewed Caregiver stress acknowledged  Educated wife Kyle Lewis and patient regarding [place], a house call PCP provider, informed patient/wife this provider may not service their geographic location Determined patient and wife  Kyle Lewis would like to proceed with outreach to this provider to check for eligibility Placed outbound call to [place], spoke with P[place]ola Lawless, left a message for Reche Dixon RN requesting a call back to discuss eligibility for this patient  Discussed with wife Kyle Lewis, this RN will follow up with her once a response is received from [place] assessments and interventions completed:  No     Care Coordination Interventions Activated:  Yes  Care Coordination Interventions:  Yes, provided   Follow up plan: Follow up call scheduled for 04/11/22 '@1'$ :00 PM    Encounter Outcome:  Pt. Visit Completed

## 2022-03-24 NOTE — Telephone Encounter (Signed)
Patient's wife called stating that she took husband to ED on yesterday. She states they waited for 6 hours then were told it would be another 5 hours. In the process of waiting patient received chest xray,but they were not seen to get results. Please review results and advise.  Message routed to Mariana Arn, NP due to Dr. [name] being out of office.

## 2022-03-24 NOTE — Patient Instructions (Addendum)
Visit Information  Thank you for taking time to visit with me today. Please don't hesitate to contact me if I can be of assistance to you.   Following are the goals we discussed today:   Goals Addressed             This Visit's Progress    He is spitting up clear sputum and his lungs do not sound good       Care Coordination Interventions: Determined patient was directed to go to the ED on 03/23/22 but left before completing his visit due to the extended wait Provided instruction about proper use of medications used for management of COPD including inhalers Reviewed medications with patient and discussed importance of medication adherence Instructed wife [name] and patient about the importance of using an incentive spirometer hourly to help expand lungs and promote coughing Determined wife [name] reported patient's symptoms to PCP office staff, they will provide further instructions following review of the chest xray taken during the ED visit on 03/23/22 Determined patient is now using his BIPAP and his breathing has improved       We need help with bathing and respite care       Care Coordination Interventions: Determined patient continues to have care needs due to his wife has physical limitations related to her health Determined patient was unable to obtain a transport chair  Active listening / Reflection utilized  Emotional Support Provided Problem [place] strategies reviewed Caregiver stress acknowledged  Educated wife [name] and patient regarding [place], a house call PCP provider, informed patient/wife this provider may not service their geographic location Determined patient and wife [name] would like to proceed with outreach to this provider to check for eligibility Placed outbound call to [place], spoke with Pecola Lawless, left a message for Reche Dixon RN requesting a call back to discuss eligibility for this patient  Discussed with wife [name], this  RN will follow up with her once a response is received from [place] next appointment is by telephone on 04/11/22 at 1:00 PM  Please call the care guide team at [phone] if you need to cancel or reschedule your appointment.   If you are experiencing a Mental Health or [place] or need someone to talk to, please call 1-800-273-TALK (toll free, 24 hour hotline) go to [facility] [address] [place] [phone])  Patient verbalizes understanding of instructions and care plan provided today and agrees to view in [place]. Active MyChart status and patient understanding of how to access instructions and care plan via MyChart confirmed with patient.     Barb Merino, RN, BSN, CCM Care Management Coordinator [facility] Care Management Direct Phone: [phone]

## 2022-03-27 ENCOUNTER — Other Ambulatory Visit: Payer: Self-pay

## 2022-03-27 ENCOUNTER — Ambulatory Visit: Payer: Self-pay

## 2022-03-27 DIAGNOSIS — G2581 Restless legs syndrome: Secondary | ICD-10-CM

## 2022-03-27 MED ORDER — GABAPENTIN 300 MG PO CAPS: 300.0000 mg | ORAL_CAPSULE | Freq: Two times a day (BID) | ORAL | 1 refills | Status: DC

## 2022-03-27 NOTE — Patient Outreach (Signed)
  Care Coordination   Follow Up Visit Note   03/27/2022 Name: JALEAL SCHLIEP MRN: [id number] DOB: Jan 04, 1946  COREY LASKI is a 76 y.o. year old male who sees [name], MD for primary care. I  spoke with patients wife and caregiver [name] today by phone  What matters to the patients health and wellness today?  Patient continues to cough up sputum    Goals Addressed             This Visit's Progress    Care Coordination Activities       Care Coordination Interventions: Determined the patient did receive the prescription for a Rollator via mail and has obtained needed equipment Discussed the patient still needs a transport wheelchair and spouse plans to check facebook marketplace Performed chart review to note patient went to the ED on 10/19 and left without being seen. [name] reports patient continues to cough up clear/white sputum. She is awaiting a return call from patients primary care provider with chest x-ray results Reviewed patient is still active with home health and will receive a home visit today between 3-4 pm Discussed [name] spoke with [place] last week regarding opportunity to enroll with [place] for in home care if the patient is in service area Collaboration with [place] who indicates she has yet to hear back from [place] and will follow back up with them and let [name] know outcome Determined the patient has yet to be contacted by [place] with Kirstie Mirza with [place] who indicates patient is still on her list to call and she will reach out to them as she is able to         SDOH assessments and interventions completed:  No     Care Coordination Interventions Activated:  Yes  Care Coordination Interventions:  Yes, provided   Follow up plan:  SW will continue to follow    Encounter Outcome:  Pt. Visit Completed   Daneen Schick, Arita Miss, CDP Social  Worker, Certified Dementia Practitioner [place] [facility] [phone]

## 2022-03-27 NOTE — Patient Instructions (Signed)
Visit Information  Thank you for taking time to visit with me today. Please don't hesitate to contact me if I can be of assistance to you.   Following are the goals we discussed today:   Goals Addressed             This Visit's Progress    Care Coordination Activities       Care Coordination Interventions: Determined the patient did receive the prescription for a Rollator via mail and has obtained needed equipment Discussed the patient still needs a transport wheelchair and spouse plans to check facebook marketplace Performed chart review to note patient went to the ED on 10/19 and left without being seen. [name] reports patient continues to cough up clear/white sputum. She is awaiting a return call from patients primary care provider with chest x-ray results Reviewed patient is still active with home health and will receive a home visit today between 3-4 pm Discussed [name] spoke with [place] last week regarding opportunity to enroll with [place] for in home care if the patient is in service area Collaboration with [place] who indicates she has yet to hear back from [place] and will follow back up with them and let [name] know outcome Determined the patient has yet to be contacted by [place] with Kirstie Mirza with [place] who indicates patient is still on her list to call and she will reach out to them as she is able to         If you are experiencing a [place] or [place] or need someone to talk to, please call 1-800-273-TALK (toll free, 24 hour hotline)  Patient verbalizes understanding of instructions and care plan provided today and agrees to view in [place]. Active MyChart status and patient understanding of how to access instructions and care plan via MyChart confirmed with patient.     Daneen Schick, BSW, CDP Social Worker, Certified Dementia  Practitioner [place] [facility]  Care Coordination [phone]

## 2022-03-27 NOTE — Telephone Encounter (Signed)
Patient's wife called again wanting results of chest xray. She would like results before nurse comes at 3pm today.  Message routed back to Durenda Age, NP

## 2022-03-27 NOTE — Telephone Encounter (Signed)
err

## 2022-03-27 NOTE — Telephone Encounter (Signed)
I called and spoke with patient's wife. I discussed with her the response from Durenda Age, NP. Patient's wife verbalized her understanding,but did not make appointment to be seen. She stated that a nurse from [place]  is coming to the home this afternoon and she stated that she would decide whether patient needs to bee sent to hospital or not and that she also wanted to know results of chest xray.She did seem a bit upset.  Message routed to Hayden Rasmussen, NP (covering provider at time call came in due to Dr. Sabra Heck being out of office)   Juluis Rainier

## 2022-03-28 ENCOUNTER — Ambulatory Visit: Payer: Self-pay

## 2022-03-28 ENCOUNTER — Encounter: Payer: Self-pay | Admitting: Family

## 2022-03-28 ENCOUNTER — Ambulatory Visit (INDEPENDENT_AMBULATORY_CARE_PROVIDER_SITE_OTHER): Payer: Medicare Other | Admitting: Family

## 2022-03-28 VITALS — BP 150/90 | HR 76 | Temp 98.4°F | Resp 18 | Ht 67.0 in | Wt 218.2 lb

## 2022-03-28 DIAGNOSIS — I700 Atherosclerosis of aorta: Secondary | ICD-10-CM | POA: Diagnosis not present

## 2022-03-28 DIAGNOSIS — J189 Pneumonia, unspecified organism: Secondary | ICD-10-CM | POA: Diagnosis not present

## 2022-03-28 DIAGNOSIS — I2510 Atherosclerotic heart disease of native coronary artery without angina pectoris: Secondary | ICD-10-CM

## 2022-03-28 MED ORDER — DOXYCYCLINE HYCLATE 100 MG PO TABS: 100.0000 mg | ORAL_TABLET | Freq: Two times a day (BID) | ORAL | 0 refills | Status: AC

## 2022-03-28 NOTE — Patient Outreach (Signed)
  Care Coordination   Follow Up Visit Note   03/28/2022 Name: Kyle Lewis MRN: [id number] DOB: 10-02-1945  Kyle Lewis is a 76 y.o. year old male who sees [name], MD for primary care. I spoke with Reche Dixon RN with [facility] by telephone.   What matters to the patients health and wellness today?  Patient and wife would like to receive primary care services from [place].     Goals Addressed             This Visit's Progress    We need help with bathing and respite care       Care Coordination Interventions: Received inbound call from [place] with [place] Provided patient's physical address to Cecille Rubin and discussed patient's primary care needs Determined Cecille Rubin will try to get approval from manager to provide services to patient's zip code area        SDOH assessments and interventions completed:  No     Care Coordination Interventions Activated:  Yes  Care Coordination Interventions:  Yes, provided   Follow up plan: Follow up call scheduled for 03/30/22 '@12'$  PM    Encounter Outcome:  Pt. Visit Completed

## 2022-03-28 NOTE — Patient Instructions (Signed)
-   Please follow up chest X-ray in one month at at [place] at [facility] then will call you with results.   - Notify provider if symptoms worsen or fail to improve

## 2022-03-28 NOTE — Progress Notes (Signed)
Provider: Javaya Oregon FNP-C  [name], MD  Patient Care Team: [name], MD as PCP - General (Family Medicine) Revankar, Reita Cliche, MD as PCP - Cardiology (Cardiology) Druscilla Brownie, MD as Consulting Physician (Dermatology) Garvin Fila, MD as Consulting Physician (Neurology) Daneen Schick as [place] Management  Extended Emergency Contact Information Primary Emergency Contact: Dokken,Rhonda Address: [address]          [place], [place] [address] Johnnette Litter of [place] Phone: [phone] Mobile Phone: [phone] Relation: Spouse  Code Status:  Full Code  Goals of care: Advanced Directive information    03/28/2022    2:24 PM  Advanced Directives  Does Patient Have a Medical Advance Directive? No  Does patient want to make changes to medical advance directive? No - Patient declined     Chief Complaint  Patient presents with   Acute Visit    Patient is requesting antibiotics. Patient states that he has some kind of infection is his spine.    HPI:  Pt is a 76 y.o. male seen today for an acute visit for antibiotics.states has some kind of infection in the spine that need antibiotics switched. Patient 's wife in the car.patient called wife during visit who stated patient was seen in the ED on 03/23/2022 for shortness of breath but left the emergency room prior to medication being prescribed.  X-ray was done 2 views which indicated Questionable  developing airspace opacity within left lower lung zone. Followup PA and lateral chest X-ray recommended in 3-4 weeks following therapy to ensure resolution and exclude underlying malignancy.  He denies any fever,chills,fatigue,body aches,runny nose,chest tightness,chest pain or palpitation.Also no worsening leg edema and no exposure to person sick with COVID-19   Also noted on x-ray was aortic Atherosclerosis.  Currently on atorvastatin 40 mg tablet daily..diagprob any  anticoagulant or aspirin.   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   Abnormal gait 07/31/2019   Abscess of left leg    Acute on chronic diastolic heart failure (HCC)    Acute respiratory failure with hypoxia and hypercapnia ([facility]) 05/27/2021   HC03   06/14/21    =  31  - 06/23/2021   Walked 250 ft  at a slow pace with a cane. Complained of back and left knee hurting with lowest sat 89% so d/c'd 02    Adenomatous colon polyp    Adrenal mass ([facility])    2 benign appearing left adrenal adenomas noted on 01/13/15 CT   Allergy    seasonal   Anxiety 04/08/2020   Arthritis    Blister of multiple sites of lower extremity 01/10/2018   Body mass index (BMI) 34.0-34.9, adult 04/07/2020   BPH with obstruction/lower urinary tract symptoms    Overview:  Probable based on symptoms   CAD (coronary artery disease) 02/01/2018   Carotid arterial disease ([facility]) 05/23/2012   Carotid artery occlusion    Chronic back pain    Cigarette smoker 09/05/2018   COLONIC POLYPS, ADENOMATOUS, HX OF 03/17/2010   Qualifier: Diagnosis of  By: Harlon Ditty CMA (AAMA), Dottie     COPD GOLD II 04/07/2019   Quit smoking 03/2019 - PFT's  04/07/2019  FEV1 1.58 (57 % ) ratio 0.54  p 0 % improvement from saba p nothing prior to study with DLCO  15.22 (66%) corrects to 3.05 (74%)  for alv volume and  FV curve concave classically     Degeneration of cervical intervertebral disc    Degeneration of lumbar intervertebral disc 06/14/2015   Depression 03/17/2010   Qualifier: Diagnosis of  By: Nelson-Smith CMA (AAMA), Dottie     Diverticulosis    DIVERTICULOSIS, COLON 03/17/2010   Qualifier: Diagnosis of  By: Harlon Ditty CMA (AAMA), Dottie     Dyspnea    Exogenous obesity 11/08/2013   Overview:  With a nine pound weight gain since his last visit   Heart murmur    History of back surgery    Rods and Screws in back   History of craniotomy 01/11/2018   History of left  knee replacement    History of right MCA stroke 07/21/2014   Hyperlipidemia LDL goal <70 01/19/2018   Hypertension    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 Oneida Alar and 06/10/2015 last visit   Impairment of balance 10/27/2021   Inflammation of sacroiliac joint ([facility]) 05/06/2019   Lower urinary tract symptoms (LUTS) 08/06/2017   Meningioma ([facility])    Microscopic hematuria 11/08/2013   Mitral regurgitation 06/02/2021   Muscle weakness 10/27/2021   Neck pain 03/25/2019   Nocturia    OA (osteoarthritis) of knee 04/01/2012   OAB (overactive bladder) 01/19/2014   OSA (obstructive sleep apnea)    Bipap per [facility] since ? 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 MAngelena Form& Dr. BCyndia Bent  Severe aortic stenosis 02/01/2018   ECHO 01/30/18 - Left ventricle: The cavity size was normal. There was moderate   focal basal hypertrophy of the septum. Systolic function was   vigorous. The estimated ejection fraction was in the range of 65%   to 70%. Wall motion was normal; there were no regional wall   motion abnormalities. Doppler parameters are consistent with   abnormal left ventricular relaxation (grade 1 diastolic   dysfun   Status post AAA (abdominal aortic aneurysm) repair 02/01/2018   Stroke ([facility] 2013   tia no residual deficit from   Tobacco use 11/08/2013   Ventral hernia 11/08/2013   Overview:  S/p repair with alloderm mesh and s/p SB resection    Past Surgical History:  Procedure Laterality Date   ABDOMINAL [place]IC [place]URYSM [place]PAIR  [place]   BACK SURGERY  2018   Rods and Screws in Back lower back   BRAIN MENINGIOMA EXCISION  1991   menigioma   CARPAL TUNNEL RELEASE     left   EYE SURGERY Bilateral    Cataract   IRRIGATION AND DEBRIDEMENT ABSCESS Left 08/25/2020   Procedure: IRRIGATION AND DEBRIDEMENT ABSCESS LEFT LEG;  Surgeon: PEvelina Bucy DPM;  [facility]  Service: Podiatry;  Laterality: Left;   JOINT REPLACEMENT Left 04-01-12   Knee   LEFT HEART CATH AND  CORONARY ANGIOGRAPHY N/A 09/15/2016   Procedure: Left Heart Cath and Coronary Angiography;  Surgeon: Nelva Bush, MD;  Location: [facility];  Service: Cardiovascular;  Laterality: N/A;   MAXIMUM ACCESS (MAS)POSTERIOR LUMBAR INTERBODY FUSION (PLIF) 1 LEVEL N/A 07/07/2015   Procedure:  POSTERIOR LUMBAR INTERBODY FUSION (PLIF) Lumbar Four-Five with Pedicle Screw Fixation Lumbar Two-Five;Laminectomy Lumbar Two-Five;  Surgeon: Eustace Moore, MD;  Location: [facility];  Service: Neurosurgery;  Laterality: N/A;   POSTERIOR LUMBAR INTERBODY FUSION (PLIF) Lumbar Four-Five with Pedicle Screw Fixation Lumbar Two-Five;Laminectomy Lumbar Two-Five   RIGHT/LEFT HEART CATH AND CORONARY ANGIOGRAPHY N/A 01/24/2018   Procedure: RIGHT/LEFT HEART CATH AND CORONARY ANGIOGRAPHY;  Surgeon: Nelva Bush, MD;  Location: [facility];  Service: Cardiovascular;  Laterality: N/A;   RIGHT/LEFT HEART CATH AND CORONARY ANGIOGRAPHY N/A 05/31/2021   Procedure: RIGHT/LEFT HEART CATH AND CORONARY ANGIOGRAPHY;  Surgeon: Burnell Blanks, MD;  Location: [facility];  Service: Cardiovascular;  Laterality: N/A;   TEE WITHOUT CARDIOVERSION N/A 06/07/2021   Procedure: TRANSESOPHAGEAL ECHOCARDIOGRAM (TEE);  Surgeon: Burnell Blanks, MD;  Location: [facility];  Service: Open Heart Surgery;  Laterality: N/A;   TEE  WITHOUT CARDIOVERSION N/A 12/08/2021   Procedure: TRANSESOPHAGEAL ECHOCARDIOGRAM (TEE);  Surgeon: Berniece Salines, DO;  Location: [facility];  Service: Cardiovascular;  Laterality: N/A;   TONSILLECTOMY  as child   TOTAL KNEE ARTHROPLASTY  04/01/2012   Procedure: TOTAL KNEE ARTHROPLASTY;  Surgeon: Gearlean Alf, MD;  Location: [facility];  Service: Orthopedics;  Laterality: Left;   TRANSCATHETER AORTIC VALVE REPLACEMENT, TRANSFEMORAL Bilateral 06/07/2021   Procedure: TRANSCATHETER AORTIC VALVE REPLACEMENT, RIGHT TRANSFEMORAL;  Surgeon: Burnell Blanks, MD;  Location: [facility];  Service: Open Heart Surgery;  Laterality: Bilateral;   TRANSURETHRAL RESECTION OF PROSTATE     ULTRASOUND GUIDANCE FOR VASCULAR ACCESS Bilateral 06/07/2021   Procedure: ULTRASOUND GUIDANCE FOR VASCULAR ACCESS;  Surgeon: Burnell Blanks, MD;  Location: [facility];  Service: Open Heart Surgery;  Laterality: Bilateral;   UMBILICAL HERNIA REPAIR  2011    Allergies  Allergen Reactions   Cefazolin Rash and Other (See Comments)    The patient had surgery and was given cefazolin intraop. ~ 10 days later he developed a rash confirmed by biopsy to be consistent w/ drug eruption. We cannot know for sure, but this is the most likely agent.      Outpatient Encounter Medications as of 03/28/2022  Medication Sig   amLODipine (NORVASC) 10 MG tablet Take 1 tablet (10 mg total) by mouth daily.   amoxicillin (AMOXIL) 500 MG capsule Take 1 capsule (500 mg total) by mouth 3 (three) times daily.   atorvastatin (LIPITOR) 40 MG tablet Take 1 tablet (40 mg total) by mouth daily.   finasteride (PROSCAR) 5 MG tablet Take 1 tablet (5 mg total) by mouth daily.   gabapentin (NEURONTIN) 300 MG capsule Take 1 capsule (300 mg total) by mouth 2 (two) times daily.   metoprolol tartrate (LOPRESSOR) 25 MG tablet Take 1 tablet (25 mg total) by mouth 2 (two) times daily.   nicotine (NICODERM CQ - DOSED IN MG/24 HOURS) 21 mg/24hr patch Place 1 patch (21 mg  total) onto the skin daily.   oxyCODONE (OXY IR/ROXICODONE) 5 MG immediate release tablet Take 1 tablet (5 mg total) by mouth every 6 (six) hours as needed for severe pain.   senna-docusate (SENOKOT-S) 8.6-50 MG tablet Take 1 tablet by mouth 2 (two) times daily.   sertraline (ZOLOFT) 50 MG tablet  Take 1 tablet (50 mg total) by mouth daily.   tamsulosin (FLOMAX) 0.4 MG CAPS capsule TAKE 1 CAPSULE BY MOUTH ONCE DAILY AFTER SUPPER   No facility-administered encounter medications on file as of 03/28/2022.    Review of Systems  Constitutional:  Negative for appetite change, chills, fatigue, fever and unexpected weight change.  HENT:  Negative for congestion, dental problem, ear discharge, ear pain, facial swelling, hearing loss, nosebleeds, postnasal drip, rhinorrhea, sinus pressure, sinus pain, sneezing, sore throat, tinnitus and trouble swallowing.   Eyes:  Negative for pain, discharge, redness, itching and visual disturbance.  Respiratory:  Positive for cough and shortness of breath. Negative for chest tightness and wheezing.   Cardiovascular:  Negative for chest pain, palpitations and leg swelling.  Gastrointestinal:  Negative for abdominal distention, abdominal pain, blood in stool, constipation, diarrhea, nausea and vomiting.  Endocrine: Negative for cold intolerance, heat intolerance, polydipsia, polyphagia and polyuria.  Genitourinary:  Negative for difficulty urinating, dysuria, flank pain, frequency and urgency.  Musculoskeletal:  Negative for arthralgias, back pain, gait problem, joint swelling, myalgias, neck pain and neck stiffness.  Skin:  Negative for color change, pallor, rash and wound.  Neurological:  Negative for dizziness, syncope, speech difficulty, weakness, light-headedness, numbness and headaches.  Hematological:  Does not bruise/bleed easily.  Psychiatric/Behavioral:  Negative for agitation, behavioral problems, confusion, hallucinations, self-injury, sleep disturbance and  suicidal ideas. The patient is not nervous/anxious.     Immunization History  Administered Date(s) Administered   Fluad Quad(high Dose 65+) 04/08/2020, 05/05/2021   Influenza, High Dose Seasonal PF 05/07/2017, 04/03/2018, 02/17/2019   Influenza,inj,Quad PF,6+ Mos 07/08/2015   Influenza,inj,quad, With Preservative 04/03/2018   Influenza-Unspecified 04/18/2017, 05/05/2017, 02/20/2019, 03/05/2021   PFIZER(Purple Top)SARS-COV-2 Vaccination 01/04/2020, 02/04/2020   Pneumococcal Polysaccharide-23 05/29/2011   Tdap 11/04/2007   Pertinent  Health Maintenance Due  Topic Date Due   COLONOSCOPY (Pts 45-18yr Insurance coverage will need to be confirmed)  09/18/2021   INFLUENZA VACCINE  01/03/2022      01/26/2022    8:57 AM 02/20/2022    1:29 PM 02/23/2022    2:31 PM 03/23/2022    2:02 PM 03/28/2022    2:24 PM  Fall Risk  Falls in the past year? 0 1 1  0  Was there an injury with Fall? 0 0 0  0  Fall Risk Category Calculator 0 2 2  0  Fall Risk Category Low Moderate Moderate  Low  Patient Fall Risk Level High fall risk Moderate fall risk Low fall risk Moderate fall risk Low fall risk  Patient at Risk for Falls Due to Impaired mobility History of fall(s);Impaired balance/gait;Impaired mobility History of fall(s)  No Fall Risks  Fall risk Follow up Falls evaluation completed Falls evaluation completed;Education provided;Falls prevention discussed   Falls evaluation completed   Functional Status Survey:    Vitals:   03/28/22 1419  BP: (!) 150/90  Pulse: 76  Resp: 18  Temp: 98.4 F (36.9 C)  SpO2: 91%  Weight: 218 lb 3.2 oz (99 kg)  Height: '5\' 7"'$  (1.702 m)   Body mass index is 34.17 kg/m. Physical Exam Vitals reviewed.  Constitutional:      General: He is not in acute distress.    Appearance: Normal appearance. He is normal weight. He is not ill-appearing or diaphoretic.  HENT:     Head: Normocephalic.     Right Ear: Tympanic membrane, ear canal and external ear normal. There  is no impacted cerumen.     Left Ear:  Tympanic membrane, ear canal and external ear normal. There is no impacted cerumen.     Nose: Nose normal. No congestion or rhinorrhea.     Mouth/Throat:     Mouth: Mucous membranes are moist.     Pharynx: Oropharynx is clear. No oropharyngeal exudate or posterior oropharyngeal erythema.  Eyes:     General: No scleral icterus.       Right eye: No discharge.        Left eye: No discharge.     Extraocular Movements: Extraocular movements intact.     Conjunctiva/sclera: Conjunctivae normal.     Pupils: Pupils are equal, round, and reactive to light.  Neck:     Vascular: No carotid bruit.  Cardiovascular:     Rate and Rhythm: Normal rate and regular rhythm.     Pulses: Normal pulses.     Heart sounds: Normal heart sounds. No murmur heard.    No friction rub. No gallop.  Pulmonary:     Effort: Pulmonary effort is normal. No respiratory distress.     Breath sounds: Examination of the left-middle field reveals rales. Examination of the left-lower field reveals rales. Rales present. No wheezing or rhonchi.  Chest:     Chest wall: No tenderness.  Abdominal:     General: Bowel sounds are normal. There is no distension.     Palpations: Abdomen is soft. There is no mass.     Tenderness: There is no abdominal tenderness. There is no right CVA tenderness, left CVA tenderness, guarding or rebound.  Musculoskeletal:        General: No swelling or tenderness. Normal range of motion.     Cervical back: Normal range of motion. No rigidity or tenderness.     Right lower leg: No edema.     Left lower leg: No edema.  Lymphadenopathy:     Cervical: No cervical adenopathy.  Skin:    General: Skin is warm and dry.     Coloration: Skin is not pale.     Findings: No bruising, erythema, lesion or rash.  Neurological:     Mental Status: He is alert and oriented to person, place, and time.     Cranial Nerves: No cranial nerve deficit.     Sensory: No sensory  deficit.     Motor: No weakness.     Coordination: Coordination normal.     Gait: Gait abnormal.  Psychiatric:        Mood and Affect: Mood normal.        Speech: Speech normal.        Behavior: Behavior normal.     Labs reviewed: Recent Labs    12/07/21 0942 12/08/21 0238 12/09/21 0304 12/11/21 0134 12/13/21 0424 12/14/21 0324 03/23/22 1420  NA 137   < > 135   < > 132* 134* 140  K 4.5   < > 4.4   < > 4.6 4.6 4.2  CL 96*   < > 95*   < > 100 100 104  CO2 36*   < > 33*   < > '26 26 27  '$ GLUCOSE 125*   < > 115*   < > 114* 123* 141*  BUN 16   < > 15   < > '17 18 21  '$ CREATININE 0.73   < > 0.72   < > 0.70 0.73 0.80  CALCIUM 8.7*   < > 8.3*   < > 8.0* 8.3* 9.1  MG 2.3  --  2.2  --  2.2  --   --   PHOS  --   --   --   --  3.9  --   --    < > = values in this interval not displayed.   Recent Labs    12/01/21 0402 12/13/21 0424 03/23/22 1420  AST '15 24 17  '$ ALT 18 34 16  ALKPHOS 68 74 100  BILITOT 0.8 0.6 0.8  PROT 5.8* 5.5* 6.6  ALBUMIN 2.4* 2.2* 3.8   Recent Labs    12/13/21 0424 12/14/21 0324 12/16/21 1421 03/23/22 1420  WBC 8.4 7.4  --  6.9  NEUTROABS 6.1 5.4  --  5.4  HGB 11.6* 11.7* 11.3* 13.9  HCT 34.0* 34.6* 32.9* 43.4  MCV 92.9 93.3  --  95.0  PLT 199 207  --  196   Lab Results  Component Value Date   TSH 1.54 04/08/2020   Lab Results  Component Value Date   HGBA1C 5.4 12/13/2021   Lab Results  Component Value Date   CHOL 159 05/31/2021   HDL 58 05/31/2021   LDLCALC 88 05/31/2021   TRIG 64 05/31/2021   CHOLHDL 2.7 05/31/2021    Significant Diagnostic Results in last 30 days:  DG Chest 2 View  Result Date: 03/23/2022 CLINICAL DATA:  Shortness of breath EXAM: CHEST - 2 VIEW COMPARISON:  Chest x-ray 11/30/2021., CT abdomen pelvis 01/23/2022 FINDINGS: The heart and mediastinal contours are within normal limits. Atherosclerotic plaque. Aortic valve replacement. Question developing airspace opacity within left lower lung zone. No pulmonary edema. No  pleural effusion. No pneumothorax. No acute osseous abnormality. Severe bilateral shoulder degenerative changes. IMPRESSION: 1. Question developing airspace opacity within left lower lung zone. Followup PA and lateral chest X-ray is recommended in 3-4 weeks following therapy to ensure resolution and exclude underlying malignancy. 2.  Aortic Atherosclerosis (ICD10-I70.0). Electronically Signed   By: Iven Finn M.D.   On: 03/23/2022 16:25   MR Lumbar Spine W Wo Contrast  Result Date: 03/14/2022 CLINICAL DATA:  Discitis of lumbosacral region M46.47 (ICD-10-CM). EXAM: MRI LUMBAR SPINE WITHOUT AND WITH CONTRAST TECHNIQUE: Multiplanar and multiecho pulse sequences of the lumbar spine were obtained without and with intravenous contrast. CONTRAST:  5m MULTIHANCE GADOBENATE DIMEGLUMINE 529 MG/ML IV SOLN COMPARISON:  MRI of the lumbar spine January 30, 2022. FINDINGS: Prominent susceptibility artifact from aortic graft partially degraded images at the level of the lumbar vertebral bodies. Susceptibility artifact from posterior spinal fusion partially degrade images at the level of the posterior elements. Segmentation:  Standard. Alignment: Stepwise small retrolisthesis from L1-2 through L3-4. Grade 1 anterolisthesis of L4 over L5. Vertebrae: Postsurgical changes from posterior decompression and instrumented fusion from L2 through L5 with interbody fusion at L4-5. There is no significant interval change of the L5-S1 subtle disc edema and contrast enhancement with associated L5 and S1 marrow edema and erosion of the L5 inferior endplate, consistent with sequela of discitis/osteomyelitis. Persistent mild prevertebral/presacral soft tissue edema. Interval decrease in size of the small nonenhancing structure in the left subarticular zone measuring up to 4 mm in the current study (compared to 6 mm on prior) which could represent residual abscess/phlegmon no new fluid collections identified. Conus medullaris and cauda  equina: Conus extends to the L1 level. Conus and cauda equina appear normal. Paraspinal and other soft tissues: Status post aortic aneurysm repair with endoprosthesis. Postsurgical changes in the posterior paraspinal soft tissues. A 1.5 cm right renal cyst (Bosniak I). No follow-up imaging is recommended. [employer] 2021; [id number]  Bosniak Classification of Cystic Renal Masses, Version 2019. Disc levels: T12-L1: No spinal canal or neural foraminal stenosis. L1-2: Disc bulge with osteophytic component and facet degenerative changes without significant spinal canal or neural foraminal stenosis. L2-3: Endplate osteophytic ridging. No significant spinal canal or neural foraminal stenosis. L3-4: No spinal canal stenosis. Neural foramina are partially obscured by susceptibility artifact but appear sufficiently patent. L4-5: No spinal canal stenosis. Neural foramina are partially obscured by susceptibility artifact but appear sufficiently patent. L5-S1 disc bulge, prominent facet degenerative changes with bilateral joint effusion and ligamentum flavum redundancy result in moderate subarticular zone stenosis and bilateral neural foraminal stenosis. IMPRESSION: 1. 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 2. Degenerative changes at L5-S1 resulting in moderate subarticular zone stenosis and bilateral neural foraminal stenosis. Electronically Signed   By: Pedro Earls M.D.   On: 03/14/2022 16:11    Assessment/Plan  1. Community acquired pneumonia of left lower lobe of lung Per CXR done in the ED  Will treat with Doxycyline -follow up with CXR in 3-4 weeks per ED  - doxycycline (VIBRA-TABS) 100 MG tablet; Take 1 tablet (100 mg total) by mouth 2 (two) times daily  for 7 days.  Dispense: 14 tablet; Refill: 0 - DG Chest 2 View; Future  2. Atherosclerosis of aorta (HCC) Dietary modification and exercise advised Continue on atorvastatin  Family/ staff Communication: Reviewed plan of care with patient and wife on the phone verbalized understanding  Labs/tests ordered:  - DG Chest 2 View; Future  Next Appointment:Return if symptoms worsen or fail to improve.    Sandrea Hughs, NP

## 2022-03-30 ENCOUNTER — Ambulatory Visit: Payer: Self-pay

## 2022-03-30 NOTE — Patient Outreach (Signed)
  Care Coordination   Follow Up Visit Note   03/30/2022 Name: Kyle Lewis MRN: [id number] DOB: 1945-07-07  Kyle Lewis is a 76 y.o. year old male who sees [name], MD for primary care. I spoke with wife [name] by phone today.  What matters to the patients health and wellness today?  Patient will take his antibiotic as prescribed for upper respiratory infection.      Goals Addressed             This Visit's Progress    He is spitting up clear sputum and his lungs do not sound good       Care Coordination Interventions: Evaluation of current treatment plan related to upper respiratory infection and patient's adherence to plan as established by provider Review of patient status, including review of consultant's reports, relevant laboratory and other test results, and medications completed Reviewed with wife follow up chest xray orders placed to [facility], [place], [place], wife will call to schedule once patient has completed his full course of antibiotics Reinforced the importance of ongoing adherence to use of incentive spirometer and staying hydrated Instructed wife [name] to call patient's doctor for new symptoms or concerns        We need help with bathing and respite care       Care Coordination Interventions: Placed outbound call to [facility], left a voice message from [facility] requesting a return call to discuss status of patient referral  Spoke with wife [name] regarding pending status and wait for call back from [facility]        SDOH assessments and interventions completed:  No     Care Coordination Interventions Activated:  Yes  Care Coordination Interventions:  Yes, provided   Follow up plan: Follow up call scheduled for 04/11/22 '@1'$ :00 PM     Encounter Outcome:  Pt. Visit Completed

## 2022-03-30 NOTE — Patient Instructions (Signed)
Visit Information  Thank you for taking time to visit with me today. Please don't hesitate to contact me if I can be of assistance to you.   Following are the goals we discussed today:   Goals Addressed             This Visit's Progress    He is spitting up clear sputum and his lungs do not sound good       Care Coordination Interventions: Evaluation of current treatment plan related to upper respiratory infection and patient's adherence to plan as established by provider Review of patient status, including review of consultant's reports, relevant laboratory and other test results, and medications completed Reviewed with wife follow up chest xray orders placed to [facility], [place], [place], wife will call to schedule once patient has completed his full course of antibiotics Reinforced the importance of ongoing adherence to use of incentive spirometer and staying hydrated Instructed wife [name] to call patient's doctor for new symptoms or concerns        We need help with bathing and respite care       Care Coordination Interventions: Placed outbound call to [place], left a voice message from Reche Dixon RN requesting a return call to discuss status of patient referral  Spoke with wife [name] regarding pending status and wait for call back from [place] next appointment is by telephone on 04/11/22 at 1:00 PM  Please call the care guide team at [phone] if you need to cancel or reschedule your appointment.   If you are experiencing a Mental Health or [place] or need someone to talk to, please call 1-800-273-TALK (toll free, 24 hour hotline) go to [facility] [address], [place] [phone])  Patient verbalizes understanding of instructions and care plan provided today and agrees to view in [place]. Active MyChart status and patient understanding of how to access instructions and care  plan via MyChart confirmed with patient.     Barb Merino, RN, BSN, CCM Care Management Coordinator [facility] Care Management Direct Phone: [phone]

## 2022-04-03 ENCOUNTER — Other Ambulatory Visit: Payer: Self-pay

## 2022-04-03 ENCOUNTER — Ambulatory Visit: Payer: Self-pay

## 2022-04-03 DIAGNOSIS — G8929 Other chronic pain: Secondary | ICD-10-CM

## 2022-04-03 DIAGNOSIS — I700 Atherosclerosis of aorta: Secondary | ICD-10-CM

## 2022-04-03 HISTORY — DX: Atherosclerosis of aorta: I70.0

## 2022-04-03 NOTE — Patient Outreach (Signed)
  Care Coordination   Follow Up Visit Note   04/03/2022 Name: Kyle Lewis MRN: [id number] DOB: 07-06-1945  Kyle Lewis is a 76 y.o. year old male who sees [name], MD for primary care. I  spoke with patients wife and caregiver [name] by phone  What matters to the patients health and wellness today?  I need a doctor in the home    Goals Addressed             This Visit's Progress    Care Coordination Activities       Care Coordination Interventions: Discussed patient is still on a wait list with [facility] and will be contacted by Kirstie Mirza for an in home assessment in the future Determined the patient has yet to hear if [place] can service his area Successful call to Reche Dixon with [place] who indicates she plans to discuss patients needs in a meeting today at 3pm to determine if they are able to assist Discussed plan for SW to contact patient and his wife on 10/31 to review outcome of referral with [place] patient continue to cough, last antibiotic to be taken today Reviewed patient still active with home health and should receive a visit today Encouraged patient to contact primary care provider as needed         SDOH assessments and interventions completed:  No     Care Coordination Interventions Activated:  Yes  Care Coordination Interventions:  Yes, provided   Follow up plan: Follow up call scheduled for 10/31    Encounter Outcome:  Pt. Visit Completed   Daneen Schick, Arita Miss, CDP Social Worker, Certified Dementia Practitioner [place] Management  Care Coordination [phone]

## 2022-04-03 NOTE — Patient Instructions (Signed)
Visit Information  Thank you for taking time to visit with me today. Please don't hesitate to contact me if I can be of assistance to you.   Following are the goals we discussed today:   Goals Addressed             This Visit's Progress    Care Coordination Activities       Care Coordination Interventions: Discussed patient is still on a wait list with [facility] and will be contacted by Kirstie Mirza for an in home assessment in the future Determined the patient has yet to hear if [place] can service his area Successful call to Reche Dixon with [place] who indicates she plans to discuss patients needs in a meeting today at 3pm to determine if they are able to assist Discussed plan for SW to contact patient and his wife on 10/31 to review outcome of referral with [place] patient continue to cough, last antibiotic to be taken today Reviewed patient still active with home health and should receive a visit today Encouraged patient to contact primary care provider as needed        If you are experiencing a Mental Health or [place] or need someone to talk to, please call 1-800-273-TALK (toll free, 24 hour hotline)  Patient verbalizes understanding of instructions and care plan provided today and agrees to view in [place]. Active MyChart status and patient understanding of how to access instructions and care plan via MyChart confirmed with patient.     Daneen Schick, BSW, CDP Social Worker, Certified Dementia Practitioner [place] Management  Care Coordination [phone]

## 2022-04-03 NOTE — Telephone Encounter (Signed)
Patient wife called with patient and requested refill on medication Oxycodone. Patient last refill dated 02/20/2022. Patient has Non Opioid Contract on file dated 04/12/2020. Patient doesn't have Opioid Contract on file. Medication pend and sent to Sherrie Mustache, NP due to PCP Sabra Heck Lillette Boxer, MD being out of office.

## 2022-04-04 ENCOUNTER — Ambulatory Visit: Payer: Self-pay

## 2022-04-04 MED ORDER — OXYCODONE HCL 5 MG PO TABS: 5.0000 mg | ORAL_TABLET | Freq: Four times a day (QID) | ORAL | 0 refills | Status: AC | PRN

## 2022-04-04 NOTE — Patient Outreach (Signed)
  Care Coordination   Follow Up Visit Note   04/04/2022 Name: Kyle Lewis MRN: [id number] DOB: 02-15-1946  Kyle Lewis is a 76 y.o. year old male who sees [name], MD for primary care. I  spoke with patients spouse [name] by phone.  What matters to the patients health and wellness today?  We want in home primary care services due to difficulty with accessibility.     Goals Addressed             This Visit's Progress    COMPLETED: Care Coordination Activities       Care Coordination Interventions: Collaboration with Reche Dixon with [place] who advises patients referral has been accepted. She anticipates initial visit will be scheduled next week around mid-week Contacted the patient and his spouse to advise referral was accepted and to expect a call from [place] to schedule an initial home visit Collaboration with [place] to advise referral accepted [place] to contact this SW as needed for future care coordination needs--SW to sign off at this time         SDOH assessments and interventions completed:  No     Care Coordination Interventions Activated:  Yes  Care Coordination Interventions:  Yes, provided   Follow up plan: No further intervention required.   Encounter Outcome:  Pt. Visit Completed   Daneen Schick, BSW, CDP Social Worker, Certified Dementia Practitioner [place] [facility]  Care Coordination [phone]

## 2022-04-04 NOTE — Patient Instructions (Signed)
Visit Information  Thank you for taking time to visit with me today. Please don't hesitate to contact me if I can be of assistance to you.   Following are the goals we discussed today:   Goals Addressed             This Visit's Progress    COMPLETED: Care Coordination Activities       Care Coordination Interventions: Collaboration with Reche Dixon with [place] who advises patients referral has been accepted. She anticipates initial visit will be scheduled next week around mid-week Contacted the patient and his spouse to advise referral was accepted and to expect a call from [place] to schedule an initial home visit Collaboration with [place] to advise referral accepted [place] to contact this SW as needed for future care coordination needs--SW to sign off at this time         If you are experiencing a [place] or [place] or need someone to talk to, please call 1-800-273-TALK (toll free, 24 hour hotline)  Patient verbalizes understanding of instructions and care plan provided today and agrees to view in [place]. Active MyChart status and patient understanding of how to access instructions and care plan via MyChart confirmed with patient.     No further follow up required: Please contact me as needed.  Daneen Schick, BSW, CDP Social Worker, Certified Dementia Practitioner [place] Management  Care Coordination [phone]

## 2022-04-05 ENCOUNTER — Ambulatory Visit: Payer: Medicare Other | Admitting: Family Medicine

## 2022-04-05 ENCOUNTER — Telehealth: Payer: Self-pay

## 2022-04-05 NOTE — Telephone Encounter (Signed)
Patient's wife called about his x-ray and stated she never received a call to schedule the xray. Advised wife that patient could just walk in for x-rays and she could call them GI to see if they will schedule appointment for the x-ray due to wife not wanting to sit in wait.

## 2022-04-06 ENCOUNTER — Inpatient Hospital Stay (HOSPITAL_COMMUNITY)
Admission: EM | Admit: 2022-04-06 | Discharge: 2022-04-12 | DRG: 190 | Disposition: A | Discharge status: Home Health | Payer: Medicare Other | Attending: Internal Medicine | Admitting: Internal Medicine

## 2022-04-06 ENCOUNTER — Other Ambulatory Visit: Payer: Self-pay

## 2022-04-06 ENCOUNTER — Encounter (HOSPITAL_COMMUNITY): Payer: Self-pay

## 2022-04-06 ENCOUNTER — Emergency Department (HOSPITAL_COMMUNITY): Payer: Medicare Other

## 2022-04-06 DIAGNOSIS — J9811 Atelectasis: Secondary | ICD-10-CM | POA: Diagnosis present

## 2022-04-06 DIAGNOSIS — J9601 Acute respiratory failure with hypoxia: Secondary | ICD-10-CM | POA: Diagnosis not present

## 2022-04-06 DIAGNOSIS — G062 Extradural and subdural abscess, unspecified: Secondary | ICD-10-CM | POA: Diagnosis present

## 2022-04-06 DIAGNOSIS — J441 Chronic obstructive pulmonary disease with (acute) exacerbation: Secondary | ICD-10-CM

## 2022-04-06 DIAGNOSIS — N401 Enlarged prostate with lower urinary tract symptoms: Secondary | ICD-10-CM | POA: Diagnosis present

## 2022-04-06 DIAGNOSIS — I5032 Chronic diastolic (congestive) heart failure: Secondary | ICD-10-CM | POA: Diagnosis not present

## 2022-04-06 DIAGNOSIS — E785 Hyperlipidemia, unspecified: Secondary | ICD-10-CM | POA: Diagnosis present

## 2022-04-06 DIAGNOSIS — Z23 Encounter for immunization: Secondary | ICD-10-CM

## 2022-04-06 DIAGNOSIS — Z8679 Personal history of other diseases of the circulatory system: Secondary | ICD-10-CM

## 2022-04-06 DIAGNOSIS — G8929 Other chronic pain: Secondary | ICD-10-CM | POA: Diagnosis present

## 2022-04-06 DIAGNOSIS — I714 Abdominal aortic aneurysm, without rupture, unspecified: Secondary | ICD-10-CM | POA: Diagnosis present

## 2022-04-06 DIAGNOSIS — G4733 Obstructive sleep apnea (adult) (pediatric): Secondary | ICD-10-CM | POA: Diagnosis present

## 2022-04-06 DIAGNOSIS — Z87891 Personal history of nicotine dependence: Secondary | ICD-10-CM

## 2022-04-06 DIAGNOSIS — I330 Acute and subacute infective endocarditis: Secondary | ICD-10-CM | POA: Diagnosis present

## 2022-04-06 DIAGNOSIS — Z8261 Family history of arthritis: Secondary | ICD-10-CM

## 2022-04-06 DIAGNOSIS — Z86011 Personal history of benign neoplasm of the brain: Secondary | ICD-10-CM

## 2022-04-06 DIAGNOSIS — I10 Essential (primary) hypertension: Secondary | ICD-10-CM | POA: Diagnosis present

## 2022-04-06 DIAGNOSIS — E669 Obesity, unspecified: Secondary | ICD-10-CM | POA: Diagnosis present

## 2022-04-06 DIAGNOSIS — Z7982 Long term (current) use of aspirin: Secondary | ICD-10-CM

## 2022-04-06 DIAGNOSIS — Z83438 Family history of other disorder of lipoprotein metabolism and other lipidemia: Secondary | ICD-10-CM

## 2022-04-06 DIAGNOSIS — I447 Left bundle-branch block, unspecified: Secondary | ICD-10-CM | POA: Diagnosis present

## 2022-04-06 DIAGNOSIS — I110 Hypertensive heart disease with heart failure: Secondary | ICD-10-CM | POA: Diagnosis present

## 2022-04-06 DIAGNOSIS — Z79899 Other long term (current) drug therapy: Secondary | ICD-10-CM

## 2022-04-06 DIAGNOSIS — Z888 Allergy status to other drugs, medicaments and biological substances status: Secondary | ICD-10-CM

## 2022-04-06 DIAGNOSIS — M549 Dorsalgia, unspecified: Secondary | ICD-10-CM | POA: Diagnosis present

## 2022-04-06 DIAGNOSIS — Z8042 Family history of malignant neoplasm of prostate: Secondary | ICD-10-CM

## 2022-04-06 DIAGNOSIS — Z1152 Encounter for screening for COVID-19: Secondary | ICD-10-CM

## 2022-04-06 DIAGNOSIS — Z981 Arthrodesis status: Secondary | ICD-10-CM

## 2022-04-06 DIAGNOSIS — Z87898 Personal history of other specified conditions: Secondary | ICD-10-CM

## 2022-04-06 DIAGNOSIS — Z6834 Body mass index (BMI) 34.0-34.9, adult: Secondary | ICD-10-CM

## 2022-04-06 DIAGNOSIS — Z96652 Presence of left artificial knee joint: Secondary | ICD-10-CM | POA: Diagnosis present

## 2022-04-06 DIAGNOSIS — Z86010 Personal history of colonic polyps: Secondary | ICD-10-CM

## 2022-04-06 DIAGNOSIS — N138 Other obstructive and reflux uropathy: Secondary | ICD-10-CM | POA: Diagnosis present

## 2022-04-06 DIAGNOSIS — Z833 Family history of diabetes mellitus: Secondary | ICD-10-CM

## 2022-04-06 DIAGNOSIS — Z8249 Family history of ischemic heart disease and other diseases of the circulatory system: Secondary | ICD-10-CM

## 2022-04-06 HISTORY — DX: Personal history of other specified conditions: Z87.898

## 2022-04-06 HISTORY — DX: Chronic obstructive pulmonary disease with (acute) exacerbation: J44.1

## 2022-04-06 LAB — CBC WITH DIFFERENTIAL/PLATELET
Abs Immature Granulocytes: 0.04 10*3/uL (ref 0.00–0.07)
Basophils Absolute: 0 10*3/uL (ref 0.0–0.1)
Basophils Relative: 1 %
Eosinophils Absolute: 0.6 10*3/uL — ABNORMAL HIGH (ref 0.0–0.5)
Eosinophils Relative: 8 %
HCT: 41.7 % (ref 39.0–52.0)
Hemoglobin: 13.9 g/dL (ref 13.0–17.0)
Immature Granulocytes: 1 %
Lymphocytes Relative: 15 %
Lymphs Abs: 1.2 10*3/uL (ref 0.7–4.0)
MCH: 31 pg (ref 26.0–34.0)
MCHC: 33.3 g/dL (ref 30.0–36.0)
MCV: 93.1 fL (ref 80.0–100.0)
Monocytes Absolute: 0.6 10*3/uL (ref 0.1–1.0)
Monocytes Relative: 7 %
Neutro Abs: 5.7 10*3/uL (ref 1.7–7.7)
Neutrophils Relative %: 68 %
Platelets: 174 10*3/uL (ref 150–400)
RBC: 4.48 MIL/uL (ref 4.22–5.81)
RDW: 13.7 % (ref 11.5–15.5)
WBC: 8.1 10*3/uL (ref 4.0–10.5)
nRBC: 0 % (ref 0.0–0.2)

## 2022-04-06 LAB — TROPONIN I (HIGH SENSITIVITY)
Troponin I (High Sensitivity): 11 ng/L (ref <18)
Troponin I (High Sensitivity): 11 ng/L (ref <18)

## 2022-04-06 LAB — BASIC METABOLIC PANEL
Anion gap: 11 (ref 5–15)
BUN: 18 mg/dL (ref 8–23)
CO2: 27 mmol/L (ref 22–32)
Calcium: 9 mg/dL (ref 8.9–10.3)
Chloride: 104 mmol/L (ref 98–111)
Creatinine, Ser: 0.83 mg/dL (ref 0.61–1.24)
GFR, Estimated: >60 mL/min (ref >60)
Glucose, Bld: 116 mg/dL — ABNORMAL HIGH (ref 70–99)
Potassium: 4 mmol/L (ref 3.5–5.1)
Sodium: 142 mmol/L (ref 135–145)

## 2022-04-06 LAB — RESP PANEL BY RT-PCR (FLU A&B, COVID) ARPGX2
Influenza A by PCR: NEGATIVE
Influenza B by PCR: NEGATIVE
SARS Coronavirus 2 by RT PCR: NEGATIVE

## 2022-04-06 LAB — BRAIN NATRIURETIC PEPTIDE: B Natriuretic Peptide: 52.4 pg/mL (ref 0.0–100.0)

## 2022-04-06 MED ORDER — ACETAMINOPHEN 325 MG PO TABS: 650.0000 mg | ORAL_TABLET | Freq: Four times a day (QID) | ORAL | Status: DC | PRN

## 2022-04-06 MED ORDER — SACCHAROMYCES BOULARDII 250 MG PO CAPS
250.0000 mg | ORAL_CAPSULE | Freq: Two times a day (BID) | ORAL | Status: DC
  Administered 2022-04-06: 250 mg via ORAL
  Filled 2022-04-06: qty 1

## 2022-04-06 MED ORDER — ONDANSETRON HCL 4 MG PO TABS: 4.0000 mg | ORAL_TABLET | Freq: Four times a day (QID) | ORAL | Status: DC | PRN

## 2022-04-06 MED ORDER — ACETAMINOPHEN 650 MG RE SUPP: 650.0000 mg | Freq: Four times a day (QID) | RECTAL | Status: DC | PRN

## 2022-04-06 MED ORDER — HYDRALAZINE HCL 20 MG/ML IJ SOLN
10.0000 mg | INTRAMUSCULAR | Status: DC | PRN
  Filled 2022-04-06: qty 1

## 2022-04-06 MED ORDER — METOPROLOL TARTRATE 25 MG PO TABS
25.0000 mg | ORAL_TABLET | Freq: Two times a day (BID) | ORAL | Status: DC
  Administered 2022-04-06 – 2022-04-12 (×12): 25 mg via ORAL
  Filled 2022-04-06 (×12): qty 1

## 2022-04-06 MED ORDER — OXYCODONE HCL 5 MG PO TABS
5.0000 mg | ORAL_TABLET | Freq: Four times a day (QID) | ORAL | Status: DC | PRN
  Administered 2022-04-06 – 2022-04-11 (×10): 5 mg via ORAL
  Filled 2022-04-06 (×10): qty 1

## 2022-04-06 MED ORDER — SODIUM CHLORIDE 0.9% FLUSH
3.0000 mL | Freq: Two times a day (BID) | INTRAVENOUS | Status: DC
  Administered 2022-04-06 – 2022-04-10 (×9): 3 mL via INTRAVENOUS

## 2022-04-06 MED ORDER — AMLODIPINE BESYLATE 10 MG PO TABS
10.0000 mg | ORAL_TABLET | Freq: Every day | ORAL | Status: DC
  Administered 2022-04-07 – 2022-04-12 (×6): 10 mg via ORAL
  Filled 2022-04-06 (×6): qty 1

## 2022-04-06 MED ORDER — SODIUM CHLORIDE 0.9 % IV SOLN
500.0000 mg | INTRAVENOUS | Status: DC
  Administered 2022-04-06 – 2022-04-09 (×4): 500 mg via INTRAVENOUS
  Filled 2022-04-06 (×4): qty 5

## 2022-04-06 MED ORDER — ONDANSETRON HCL 4 MG/2ML IJ SOLN: 4.0000 mg | Freq: Four times a day (QID) | INTRAMUSCULAR | Status: DC | PRN

## 2022-04-06 MED ORDER — ATORVASTATIN CALCIUM 40 MG PO TABS
40.0000 mg | ORAL_TABLET | Freq: Every day | ORAL | Status: DC
  Administered 2022-04-07 – 2022-04-12 (×6): 40 mg via ORAL
  Filled 2022-04-06 (×6): qty 1

## 2022-04-06 MED ORDER — SENNOSIDES-DOCUSATE SODIUM 8.6-50 MG PO TABS: 1.0000 | ORAL_TABLET | Freq: Every day | ORAL | Status: DC | PRN

## 2022-04-06 MED ORDER — GABAPENTIN 300 MG PO CAPS
300.0000 mg | ORAL_CAPSULE | Freq: Two times a day (BID) | ORAL | Status: DC
  Administered 2022-04-06 – 2022-04-12 (×12): 300 mg via ORAL
  Filled 2022-04-06 (×12): qty 1

## 2022-04-06 MED ORDER — PREDNISONE 20 MG PO TABS
40.0000 mg | ORAL_TABLET | Freq: Every day | ORAL | Status: DC
  Administered 2022-04-07 – 2022-04-10 (×4): 40 mg via ORAL
  Filled 2022-04-06 (×4): qty 2

## 2022-04-06 MED ORDER — ALBUTEROL SULFATE (2.5 MG/3ML) 0.083% IN NEBU
2.5000 mg | INHALATION_SOLUTION | RESPIRATORY_TRACT | Status: DC | PRN
  Filled 2022-04-06: qty 3

## 2022-04-06 MED ORDER — AMOXICILLIN 500 MG PO CAPS
500.0000 mg | ORAL_CAPSULE | Freq: Three times a day (TID) | ORAL | Status: DC
  Administered 2022-04-06 – 2022-04-12 (×17): 500 mg via ORAL
  Filled 2022-04-06 (×19): qty 1

## 2022-04-06 MED ORDER — GUAIFENESIN ER 600 MG PO TB12
600.0000 mg | ORAL_TABLET | Freq: Two times a day (BID) | ORAL | Status: DC
  Administered 2022-04-06 – 2022-04-12 (×12): 600 mg via ORAL
  Filled 2022-04-06 (×12): qty 1

## 2022-04-06 MED ORDER — IPRATROPIUM-ALBUTEROL 0.5-2.5 (3) MG/3ML IN SOLN
3.0000 mL | Freq: Four times a day (QID) | RESPIRATORY_TRACT | Status: DC
  Administered 2022-04-06 – 2022-04-07 (×3): 3 mL via RESPIRATORY_TRACT
  Filled 2022-04-06 (×3): qty 3

## 2022-04-06 MED ORDER — FINASTERIDE 5 MG PO TABS
5.0000 mg | ORAL_TABLET | Freq: Every day | ORAL | Status: DC
  Administered 2022-04-07 – 2022-04-12 (×6): 5 mg via ORAL
  Filled 2022-04-06 (×6): qty 1

## 2022-04-06 MED ORDER — TAMSULOSIN HCL 0.4 MG PO CAPS
0.4000 mg | ORAL_CAPSULE | Freq: Every day | ORAL | Status: DC
  Administered 2022-04-06 – 2022-04-12 (×6): 0.4 mg via ORAL
  Filled 2022-04-06 (×7): qty 1

## 2022-04-06 MED ORDER — SERTRALINE HCL 50 MG PO TABS
50.0000 mg | ORAL_TABLET | Freq: Every day | ORAL | Status: DC
  Administered 2022-04-07 – 2022-04-12 (×6): 50 mg via ORAL
  Filled 2022-04-06 (×6): qty 1

## 2022-04-06 MED ORDER — IPRATROPIUM-ALBUTEROL 0.5-2.5 (3) MG/3ML IN SOLN
3.0000 mL | Freq: Once | RESPIRATORY_TRACT | Status: AC
  Administered 2022-04-06: 3 mL via RESPIRATORY_TRACT
  Filled 2022-04-06: qty 3

## 2022-04-06 MED ORDER — ENOXAPARIN SODIUM 60 MG/0.6ML IJ SOSY
0.5000 mg/kg | PREFILLED_SYRINGE | INTRAMUSCULAR | Status: DC
  Administered 2022-04-06 – 2022-04-11 (×6): 50 mg via SUBCUTANEOUS
  Filled 2022-04-06 (×7): qty 0.6

## 2022-04-06 NOTE — ED Triage Notes (Addendum)
Pt bib [facility] from home where he lives with his wife. Pt has complaints of shob that he has had for weeks. Hx COPD. Home health nurse told pt to come in yesterday. Pt found to be 88% on RA by EMS, was placed on [facility] and brought back up to 95%. Pt given '125mg'$  solumedrol and albuterol en route.  Pt AOx4 and denies pain  EMS vitals 142/86, 82HR

## 2022-04-06 NOTE — ED Provider Notes (Signed)
Emergency Department Provider Note   I have reviewed the triage vital signs and the nursing notes.   HISTORY  Chief Complaint Shortness of Breath   HPI Kyle Lewis is a 76 y.o. male with past history reviewed below presents to the emergency department for evaluation of shortness of breath and wheezing with new hypoxemia.  Patient denies any fevers or chills.  Describes a "slight" tightness in the chest which is typical of his COPD exacerbations.  He lives at home and was seen by a home health nurse this morning and found to be hypoxemic to 88% by EMS when they arrived. He was placed on 2L , given 125 mg solumedrol, and albuterol neb en route. He feels slightly better but does not feel back to baseline. No vomiting or diarrhea.    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   Abnormal gait 07/31/2019   Abscess of left leg    Acute on chronic diastolic heart failure (HCC)    Acute respiratory failure with hypoxia and hypercapnia ([place]) 05/27/2021   HC03   06/14/21    =  31  - 06/23/2021   Walked 250 ft  at a slow pace with a cane. Complained of back and left knee hurting with lowest sat 89% so d/c'd 02    Adenomatous colon polyp    Adrenal mass ([place])    2 benign appearing left adrenal adenomas noted on 01/13/15 CT   Allergy    seasonal   Anxiety 04/08/2020   Arthritis    Blister of multiple sites of lower extremity 01/10/2018   Body mass index (BMI) 34.0-34.9, adult 04/07/2020   BPH with obstruction/lower urinary tract symptoms    Overview:  Probable based on symptoms   CAD (coronary artery disease) 02/01/2018   Carotid arterial disease ([place]) 05/23/2012   Carotid artery occlusion    Chronic back pain    Cigarette smoker 09/05/2018   COLONIC POLYPS, ADENOMATOUS, HX OF 03/17/2010   Qualifier: Diagnosis of  By: Harlon Ditty CMA (AAMA), Dottie     COPD GOLD II 04/07/2019   Quit smoking 03/2019 - PFT's   04/07/2019  FEV1 1.58 (57 % ) ratio 0.54  p 0 % improvement from saba p nothing prior to study with DLCO  15.22 (66%) corrects to 3.05 (74%)  for alv volume and FV curve concave classically     Degeneration of cervical intervertebral disc    Degeneration of lumbar intervertebral disc 06/14/2015   Depression 03/17/2010   Qualifier: Diagnosis of  By: Nelson-Smith CMA (AAMA), Dottie     Diverticulosis    DIVERTICULOSIS, COLON 03/17/2010   Qualifier: Diagnosis of  By: Harlon Ditty CMA (AAMA), Dottie     Dyspnea    Exogenous obesity 11/08/2013   Overview:  With a nine pound weight gain since his last visit   Heart murmur    History of back surgery    Rods and Screws in back   History of craniotomy 01/11/2018   History of left knee replacement    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 Oneida Alar and 06/10/2015 last visit   Impairment of balance 10/27/2021   Inflammation of sacroiliac joint ([place]) 05/06/2019  Lower urinary tract symptoms (LUTS) 08/06/2017   Meningioma ([place])    Microscopic hematuria 11/08/2013   Mitral regurgitation 06/02/2021   Muscle weakness 10/27/2021   Neck pain 03/25/2019   Nocturia    OA (osteoarthritis) of knee 04/01/2012   OAB (overactive bladder) 01/19/2014   OSA (obstructive sleep apnea)    Bipap per [place] since ? 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 MAngelena Form& Dr. BCyndia Bent  Severe aortic stenosis 02/01/2018   ECHO 01/30/18 - Left ventricle: The cavity size was normal. There was moderate   focal basal hypertrophy of the septum. Systolic function was   vigorous. The estimated ejection fraction was in the range of 65%   to 70%. Wall motion was normal; there were no regional wall   motion abnormalities. Doppler parameters are consistent with   abnormal left ventricular relaxation (grade 1 diastolic   dysfun   Status post AAA (abdominal aortic aneurysm) repair 02/01/2018   Stroke ([place] 2013   tia no residual deficit from   Tobacco use 11/08/2013   Ventral hernia 11/08/2013   Overview:  S/p repair with alloderm mesh and s/p SB resection    Review of Systems  Constitutional: No fever/chills Cardiovascular: Positive chest pain. Respiratory: Positive shortness of breath. Gastrointestinal: No abdominal pain.  No nausea, no vomiting.  No diarrhea.  No constipation. Genitourinary: Negative for dysuria. Musculoskeletal: Negative for back pain. Skin: Negative for rash. Neurological: Negative for headaches.   ____________________________________________   PHYSICAL EXAM:  VITAL SIGNS: ED Triage Vitals  Enc Vitals Group     BP 04/06/22 1304 (!) 158/81     Pulse Rate 04/06/22 1304 71     Resp 04/06/22 1304 18     Temp 04/06/22 1304 98.6 F (37 C)     Temp Source 04/06/22 1304 Oral     SpO2 04/06/22 1304 98 %     Weight 04/06/22 1302 218 lb (98.9 kg)     Height 04/06/22 1302 '5\' 7"'$  (1.702 m)   Constitutional: Alert and oriented. Well appearing and in no acute distress. Eyes: Conjunctivae are normal.  Head: Atraumatic. Nose: No congestion/rhinnorhea. Mouth/Throat: Mucous membranes are moist.   Neck: No stridor. Cardiovascular: Normal rate, regular rhythm. Good peripheral circulation. Grossly normal heart sounds.   Respiratory: Slight increased respiratory effort.  No retractions. Lungs with  end-expiratory wheezing bilaterally.  Gastrointestinal: Soft and nontender. No distention.  Musculoskeletal: No lower extremity tenderness nor edema. No gross deformities of extremities. Neurologic:  Normal speech and language.  Skin:  Skin is warm, dry and intact. No rash noted.  ____________________________________________   LABS (all labs ordered are listed, but only abnormal results are displayed)  Labs Reviewed  BASIC METABOLIC PANEL - Abnormal; Notable for the following components:      Result Value   Glucose, Bld 116 (*)    All other components within normal limits  CBC WITH DIFFERENTIAL/PLATELET - Abnormal; Notable for the  following components:   Eosinophils Absolute 0.6 (*)    All other components within normal limits  BASIC METABOLIC PANEL - Abnormal; Notable for the following components:   Glucose, Bld 123 (*)    Calcium 8.8 (*)    All other components within normal limits  RESP PANEL BY RT-PCR (FLU A&B, COVID) ARPGX2  BRAIN NATRIURETIC PEPTIDE  CBC  TROPONIN I (HIGH SENSITIVITY)  TROPONIN I (HIGH SENSITIVITY)   ____________________________________________  EKG   EKG Interpretation  Date/Time:  Thursday April 06 2022 12:58:39 EDT Ventricular Rate:  70 PR Interval:  198 QRS Duration: 145 QT Interval:  416 QTC Calculation: 449 R Axis:   -55 Text Interpretation: Sinus rhythm Atrial premature complex Left bundle branch block Confirmed by Nanda Quinton [id number]) on 04/06/2022 1:03:36 PM       ____________________________________________   PROCEDURES  Procedure(s) performed:   Procedures  None  ____________________________________________   INITIAL IMPRESSION / ASSESSMENT AND PLAN / ED COURSE  Pertinent labs & imaging results that were available during my care of the patient were reviewed by me and considered in my medical decision making (see chart for details).   This patient is Presenting for Evaluation of SOB, which does require a range of  treatment options, and is a complaint that involves a high risk of morbidity and mortality.  The Differential Diagnoses include CAP, COVID, ACS, PE, CHF, etc.  Critical Interventions-    Medications  enoxaparin (LOVENOX) injection 50 mg (50 mg Subcutaneous Given 04/09/22 2212)  sodium chloride flush (NS) 0.9 % injection 3 mL (3 mLs Intravenous Given 04/09/22 2216)  ondansetron (ZOFRAN) tablet 4 mg (has no administration in time range)    Or  ondansetron (ZOFRAN) injection 4 mg (has no administration in time range)  acetaminophen (TYLENOL) tablet 650 mg (has no administration in time range)    Or  acetaminophen (TYLENOL) suppository 650 mg (has no administration in time range)  albuterol (PROVENTIL) (2.5 MG/3ML) 0.083% nebulizer solution 2.5 mg (has no administration in time range)  predniSONE (DELTASONE) tablet 40 mg (40 mg Oral Given 04/09/22 0751)  azithromycin (ZITHROMAX) 500 mg in sodium chloride 0.9 % 250 mL IVPB (500 mg Intravenous New Bag/Given 04/09/22 1725)  guaiFENesin (MUCINEX) 12 hr tablet 600 mg (600 mg Oral Given 04/09/22 2211)  oxyCODONE (Oxy IR/ROXICODONE) immediate release tablet 5 mg (5 mg Oral Given 04/09/22 0957)  amLODipine (NORVASC) tablet 10 mg (10 mg Oral Given 04/09/22 1000)  metoprolol tartrate (LOPRESSOR) tablet 25 mg (25 mg Oral Given 04/09/22 2212)  atorvastatin (LIPITOR) tablet 40 mg (40 mg Oral Given 04/09/22 0957)  hydrALAZINE (APRESOLINE) injection 10 mg (has no administration in time range)  senna-docusate (Senokot-S) tablet 1 tablet (has no administration in time range)  sertraline (ZOLOFT) tablet 50 mg (50 mg Oral Given 04/09/22 0956)  finasteride (PROSCAR) tablet 5 mg (5 mg Oral Given 04/09/22 0956)  tamsulosin (FLOMAX) capsule 0.4 mg (0.4 mg Oral Given 04/09/22 1721)  gabapentin (NEURONTIN) capsule 300 mg (300 mg Oral Given 04/09/22 2211)  amoxicillin (AMOXIL) capsule 500 mg (500 mg Oral Given 04/09/22 2211)  ipratropium-albuterol (DUONEB) 0.5-2.5 (3) MG/3ML  nebulizer solution 3 mL (3 mLs Nebulization Given 04/09/22 2022)  ipratropium-albuterol (DUONEB) 0.5-2.5 (3) MG/3ML nebulizer solution 3 mL (3 mLs Nebulization Given 04/06/22 1319)    Reassessment after intervention: Symptoms improved.    I did obtain Additional Historical Information from EMS.    Clinical Laboratory Tests Ordered, included COVID and Flu PCR. BNP and troponin negative. No AKI.  Radiologic Tests Ordered, included CXR. I independently interpreted the images and agree with radiology interpretation.   Cardiac Monitor Tracing which shows NSR.    Social Determinants of Health Risk patient is not an active smoker.   Consult complete with Hospitalist. Plan for admit.   Medical Decision Making: Summary:  Patient with COPD exacerbation symptoms and new O2 requirement. No fever. Looking well on 2L [place]. CXR without infiltrate. Plan for admit for COPD mgmt.   Reevaluation with update and discussion with patient. Continuing to feel improved after nebs. Plan for admit. Patient in agreement.   Disposition: admit  ____________________________________________  FINAL CLINICAL IMPRESSION(S) / ED DIAGNOSES  Final diagnoses:  COPD exacerbation ([place])    Note:  This document was prepared using Dragon voice recognition software and may include unintentional dictation errors.  Nanda Quinton, MD, [facility] Emergency Medicine    Leba Tibbitts, [facility], MD 04/10/22 [phone]

## 2022-04-06 NOTE — H&P (Signed)
History and Physical    Patient: Kyle Lewis UJW:[id number] DOB: 1945/08/07 DOA: 04/06/2022 DOS: the patient was seen and examined on 04/06/2022 PCP: [name], MD  Patient coming from: Home via EMS  Chief Complaint:  Chief Complaint  Patient presents with   Shortness of Breath   HPI: [name] is a 76 y.o. male with medical history significant of hypertension, hyperlipidemia, diastolic CHF, COPD, AAA, chronic back pain, remote tobacco abuse, OSA on CPAP BiPAP presents with complaints of progressively worsening shortness of breath over the last couple weeks.  Patient reports having a productive cough with clear sputum and has been wheezing.  He reports being unable to catch his breath even moving around the house.  He had been taking albuterol nebs 2-3 times per day.  Patient is not on any maintenance inhaler.  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  Patient was treated with IV antibiotics and currently recommended to continue on amoxicillin per ID.  He denies having any significant fever, chest pain, nausea, vomiting, diarrhea, or dysuria.  En route with EMS patient was noted to be hypoxic down to 88% on room air and placed on 2 L of nasal cannula oxygen with improvement.  Patient had been given 25 mg of Solu-Medrol and albuterol nebs in route.   In the emergency department patient was noted to be afebrile with blood pressures elevated up to 171/88, and O2 saturations currently maintained on 2 L of nasal cannula oxygen.  Labs are relatively unremarkable.  Influenza and COVID-19 screening were negative.  Chest x-ray noted mild bibasilar opacities improved from the left compared on the prior study to favor atelectasis.  Patient has been given DuoNeb breathing  treatment.  Review of Systems: As mentioned in the history of present illness. All other systems reviewed and are negative. 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   Abnormal gait 07/31/2019   Abscess of left leg    Acute on chronic diastolic heart failure (HCC)    Acute respiratory failure with hypoxia and hypercapnia ([place]) 05/27/2021   HC03   06/14/21    =  31  - 06/23/2021   Walked 250 ft  at a slow pace with a cane. Complained of back and left knee hurting with lowest sat 89% so d/c'd 02    Adenomatous colon polyp    Adrenal mass ([place])    2 benign appearing left adrenal adenomas noted on 01/13/15 CT   Allergy    seasonal   Anxiety 04/08/2020   Arthritis    Blister of multiple sites of lower extremity 01/10/2018   Body mass index (BMI) 34.0-34.9, adult 04/07/2020   BPH with obstruction/lower urinary tract symptoms    Overview:  Probable based on symptoms   CAD (coronary artery disease) 02/01/2018   Carotid arterial disease ([place]) 05/23/2012   Carotid artery occlusion    Chronic back pain    Cigarette smoker 09/05/2018   COLONIC POLYPS, ADENOMATOUS, HX OF 03/17/2010   Qualifier: Diagnosis of  By: Nelson-Beuna Bolding CMA (AAMA), Dottie     COPD GOLD II 04/07/2019   Quit smoking 03/2019 - PFT's  04/07/2019  FEV1 1.58 (57 % ) ratio 0.54  p 0 % improvement from  saba p nothing prior to study with DLCO  15.22 (66%) corrects to 3.05 (74%)  for alv volume and FV curve concave classically     Degeneration of cervical intervertebral disc    Degeneration of lumbar intervertebral disc 06/14/2015   Depression 03/17/2010   Qualifier: Diagnosis of  By: Nelson-Tamie Minteer CMA (AAMA), Dottie     Diverticulosis    DIVERTICULOSIS, COLON 03/17/2010   Qualifier: Diagnosis of  By: Harlon Ditty CMA (AAMA), Dottie     Dyspnea    Exogenous obesity 11/08/2013   Overview:  With a nine pound weight gain since his last visit   Heart murmur    History  of back surgery    Rods and Screws in back   History of craniotomy 01/11/2018   History of left knee replacement    History of right MCA stroke 07/21/2014   Hyperlipidemia LDL goal <70 01/19/2018   Hypertension    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 Oneida Alar and 06/10/2015 last visit   Impairment of balance 10/27/2021   Inflammation of sacroiliac joint ([place]) 05/06/2019   Lower urinary tract symptoms (LUTS) 08/06/2017   Meningioma ([place])    Microscopic hematuria 11/08/2013   Mitral regurgitation 06/02/2021   Muscle weakness 10/27/2021   Neck pain 03/25/2019   Nocturia    OA (osteoarthritis) of knee 04/01/2012   OAB (overactive bladder) 01/19/2014   OSA (obstructive sleep apnea)    Bipap per Chodri since ? 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 MAngelena Form& Dr. BCyndia Bent  Severe aortic stenosis 02/01/2018   ECHO 01/30/18 - Left ventricle: The cavity size was normal. There was moderate   focal basal hypertrophy of the septum. Systolic function was   vigorous. The estimated ejection fraction was in the range of 65%   to 70%. Wall motion was normal; there were no regional wall   motion abnormalities. Doppler parameters are consistent with   abnormal left ventricular relaxation (grade 1 diastolic   dysfun   Status post AAA (abdominal aortic aneurysm) repair 02/01/2018   Stroke ([place] 2013   tia no residual deficit from   Tobacco use  11/08/2013   Ventral hernia 11/08/2013   Overview:  S/p repair with alloderm mesh and s/p SB resection   Past Surgical History:  Procedure Laterality Date   ABDOMINAL AORTIC ANEURYSM REPAIR  2019   cJakin as child   BACK SURGERY  2018   Rods and Screws in Back lower back   BRAIN MENINGIOMA EXCISION  1991   menigioma   CARPAL TUNNEL RELEASE     left   EYE SURGERY Bilateral    Cataract   IRRIGATION AND DEBRIDEMENT ABSCESS Left 08/25/2020   Procedure: IRRIGATION AND DEBRIDEMENT ABSCESS LEFT LEG;  Surgeon: PEvelina Bucy DPM;  [facility]  Service: Podiatry;  Laterality: Left;   JOINT REPLACEMENT Left 04-01-12   Knee   LEFT HEART CATH AND CORONARY ANGIOGRAPHY N/A 09/15/2016   Procedure: Left Heart Cath and Coronary Angiography;  Surgeon: Nelva Bush, MD;  Location: [facility];  Service: Cardiovascular;  Laterality: N/A;   MAXIMUM ACCESS (MAS)POSTERIOR LUMBAR INTERBODY FUSION (PLIF) 1 LEVEL N/A 07/07/2015   Procedure:  POSTERIOR LUMBAR INTERBODY FUSION (PLIF) Lumbar Four-Five with Pedicle Screw Fixation Lumbar Two-Five;Laminectomy Lumbar Two-Five;  Surgeon: Eustace Moore, MD;  Location: [facility];  Service: Neurosurgery;  Laterality: N/A;   POSTERIOR LUMBAR INTERBODY FUSION (PLIF) Lumbar Four-Five with Pedicle Screw Fixation Lumbar Two-Five;Laminectomy Lumbar Two-Five   RIGHT/LEFT HEART CATH AND CORONARY ANGIOGRAPHY N/A 01/24/2018   Procedure: RIGHT/LEFT HEART CATH AND CORONARY ANGIOGRAPHY;  Surgeon: Nelva Bush, MD;  Location: [facility];  Service: Cardiovascular;  Laterality: N/A;   RIGHT/LEFT HEART CATH AND CORONARY ANGIOGRAPHY N/A 05/31/2021   Procedure: RIGHT/LEFT HEART CATH AND CORONARY ANGIOGRAPHY;  Surgeon: Burnell Blanks, MD;  Location: [facility];  Service: Cardiovascular;  Laterality: N/A;   TEE WITHOUT CARDIOVERSION N/A 06/07/2021   Procedure: TRANSESOPHAGEAL ECHOCARDIOGRAM (TEE);  Surgeon:  Burnell Blanks, MD;  Location: [facility];  Service: Open Heart Surgery;  Laterality: N/A;   TEE WITHOUT CARDIOVERSION N/A 12/08/2021   Procedure: TRANSESOPHAGEAL ECHOCARDIOGRAM (TEE);  Surgeon: Berniece Salines, DO;  Location: [facility];  Service: Cardiovascular;  Laterality: N/A;   TONSILLECTOMY  as child   TOTAL KNEE ARTHROPLASTY  04/01/2012   Procedure: TOTAL KNEE ARTHROPLASTY;  Surgeon: Gearlean Alf, MD;  Location: [facility];  Service: Orthopedics;  Laterality: Left;   TRANSCATHETER AORTIC VALVE REPLACEMENT, TRANSFEMORAL Bilateral 06/07/2021   Procedure: TRANSCATHETER AORTIC VALVE REPLACEMENT, RIGHT TRANSFEMORAL;  Surgeon: Burnell Blanks, MD;  Location: [facility];  Service: Open Heart Surgery;  Laterality: Bilateral;   TRANSURETHRAL RESECTION OF PROSTATE     ULTRASOUND GUIDANCE FOR VASCULAR ACCESS Bilateral 06/07/2021   Procedure: ULTRASOUND GUIDANCE FOR VASCULAR ACCESS;  Surgeon: Burnell Blanks, MD;  Location: [facility];  Service: Open Heart Surgery;  Laterality: Bilateral;   UMBILICAL HERNIA REPAIR  2011   Social History:  reports that he quit smoking about 11 months ago. His smoking use included cigarettes. He has a 30.00 pack-year smoking history. He has never used smokeless tobacco. He reports current alcohol use. He reports that he does not use drugs.  Allergies  Allergen Reactions   Cefazolin Rash and Other (See Comments)    The patient had surgery and was given cefazolin intraop. ~ 10 days later he developed a rash confirmed by biopsy to be consistent w/ drug eruption. We cannot know for sure, but this is the most likely agent.      Family History  Problem Relation Age of Onset   Heart disease Mother        Onset ~5 y/o   Hypertension Mother        Deceased from old age at 44   Hyperlipidemia Mother    Arthritis Mother    Diabetes Father        Deceased from old age at 60   Heart attack Father    Heart disease Father        CABG at age 43   Dementia Father 8    Arthritis Father    Prostate cancer Maternal Grandfather    Colon cancer Neg Hx    Esophageal cancer Neg Hx    Rectal cancer Neg  Hx    Stomach cancer Neg Hx     Prior to Admission medications   Medication Sig Start Date End Date Taking? Authorizing Provider  amLODipine (NORVASC) 10 MG tablet Take 1 tablet (10 mg total) by mouth daily. 03/21/22   [name], MD  amoxicillin (AMOXIL) 500 MG capsule Take 1 capsule (500 mg total) by mouth 3 (three) times daily. 02/28/22 05/29/22  Mignon Pine, DO  atorvastatin (LIPITOR) 40 MG tablet Take 1 tablet (40 mg total) by mouth daily. 08/12/21   [name], MD  finasteride (PROSCAR) 5 MG tablet Take 1 tablet (5 mg total) by mouth daily. 05/05/21   Fargo, Amy E, NP  gabapentin (NEURONTIN) 300 MG capsule Take 1 capsule (300 mg total) by mouth 2 (two) times daily. 03/27/22   [name], MD  metoprolol tartrate (LOPRESSOR) 25 MG tablet Take 1 tablet (25 mg total) by mouth 2 (two) times daily. 12/16/21   Hosie Poisson, MD  nicotine (NICODERM CQ - DOSED IN MG/24 HOURS) 21 mg/24hr patch Place 1 patch (21 mg total) onto the skin daily. 12/17/21   Hosie Poisson, MD  oxyCODONE (OXY IR/ROXICODONE) 5 MG immediate release tablet Take 1 tablet (5 mg total) by mouth every 6 (six) hours as needed for severe pain. 04/04/22   [name], MD  senna-docusate (SENOKOT-S) 8.6-50 MG tablet Take 1 tablet by mouth 2 (two) times daily. 12/16/21   Hosie Poisson, MD  sertraline (ZOLOFT) 50 MG tablet Take 1 tablet (50 mg total) by mouth daily. 06/15/21   Eileen Stanford, PA-C  tamsulosin (FLOMAX) 0.4 MG CAPS capsule TAKE 1 CAPSULE BY MOUTH ONCE DAILY AFTER SUPPER 07/18/21   [name], MD    Physical Exam: Vitals:   04/06/22 1302 04/06/22 1304 04/06/22 1400 04/06/22 1530  BP:  (!) 158/81 (!) 171/88 (!) 152/91  Pulse:  71 69 66  Resp:  '18 14 20  '$ Temp:  98.6 F (37 C)    TempSrc:  Oral    SpO2:  98% 99% 98%  Weight: 98.9 kg     Height:  '5\' 7"'$  (1.702 m)      Exam  Constitutional: Elderly male in no acute distress Eyes: PERRL, lids and conjunctivae normal ENMT: Mucous membranes are moist.   Neck: normal, supple.  No JVD appreciated Respiratory: Diffuse wheezing bilaterally.  Patient on 2 L nasal cannula oxygen with O2 saturations maintained.  Patient able to talk in shortened sentences.  No accessory muscle usage appreciated. Cardiovascular: Regular rate and rhythm, no murmurs / rubs / gallops. No extremity edema. .  Abdomen: no tenderness, no masses palpated.   Bowel sounds positive.  Musculoskeletal: no clubbing / cyanosis.  Decreased range of motion of the spine as well as left shoulder. Skin: Abrasion to the center of the forehead.  Patient reports bumping into a shelf. Neurologic: CN 2-12 grossly intact.  Strength 5/5 in all 4.  Psychiatric: Normal judgment and insight. Alert and oriented x 3. Normal mood.   Data Reviewed:  EKG revealed normal sinus rhythm at 70 bpm with left bundle branch block premature atrial complex.  Reviewed labs, imaging and pertinent records as noted above in HPI.  Assessment and Plan: Acute respiratory failure with hypoxia secondary to COPD exacerbation Patient presents with complaints of progressively worsening shortness of breath.  O2 saturations initially noted to be 88% on room air requiring 2 L of nasal cannula oxygen to maintain O2 saturations greater than 92%.  Chest x-ray  noted mild bibasilar opacities favoring atelectasis.  Patient had no white count and was afebrile.  He had received 125 mg of Solu-Medrol IV and breathing treatments. -Admit to a medical telemetry bed -Nasal cannula oxygen as needed to maintain O2 saturation greater than 92% -Incentive spirometry and flutter valve -DuoNebs 4 times daily and albuterol nebs as needed for breath/wheezing -Prednisone 40 mg daily -Azithromycin -Check pulse oximetry with ambulation  Essential hypertension Blood pressure elevated up to  171/88. -Continue home medications of metoprolol and amlodipine  Chronic back pain epidural abscess Patient with prior history of back surgeries which he is on oxycodone as needed for pain.  Noted during hospitalization at the end of June to have an epidural abscess. -Continue oxycodone as needed -Continue oxacillin  History of bacteremia and endocarditis Patient had been found to be positive for gemellus sanguinous bacteremia during hospitalization at the end of June treated initially with penicillin and gentamicin and currently on amoxicillin.  Diastolic congestive heart failure Chronic.  Patient appears to be euvolemic at this time.  BNP within normal limits at 52.4. -Monitor  BPH S/p transurethral resection of the prostate -Continue Proscar and Flomax  Hyperlipidemia -Continue statin  History of tobacco abuse Reports quitting smoking approximately 2 years ago, but has over 60 pack-year history of tobacco use. -Courage continued cessation of tobacco use  OSA -Continue Bipap at night  AAA s/p repair TAVR  Obesity BMI 34.14 kg/m  DVT prophylaxis: Advance Care Planning:   Code Status: Full Code    Consults: none   Family Communication: wife update over phone   Severity of Illness: The appropriate patient status for this patient is OBSERVATION. Observation status is judged to be reasonable and necessary in order to provide the required intensity of service to ensure the patient's safety. The patient's presenting symptoms, physical exam findings, and initial radiographic and laboratory data in the context of their medical condition is felt to place them at decreased risk for further clinical deterioration. Furthermore, it is anticipated that the patient will be medically stable for discharge from the hospital within 2 midnights of admission.   Author: Norval Morton, MD 04/06/2022 3:35 PM  For on call review [email or web address].

## 2022-04-07 DIAGNOSIS — N138 Other obstructive and reflux uropathy: Secondary | ICD-10-CM | POA: Diagnosis present

## 2022-04-07 DIAGNOSIS — Z8679 Personal history of other diseases of the circulatory system: Secondary | ICD-10-CM | POA: Diagnosis not present

## 2022-04-07 DIAGNOSIS — Z8249 Family history of ischemic heart disease and other diseases of the circulatory system: Secondary | ICD-10-CM | POA: Diagnosis not present

## 2022-04-07 DIAGNOSIS — Z87891 Personal history of nicotine dependence: Secondary | ICD-10-CM | POA: Diagnosis not present

## 2022-04-07 DIAGNOSIS — I5032 Chronic diastolic (congestive) heart failure: Secondary | ICD-10-CM | POA: Diagnosis present

## 2022-04-07 DIAGNOSIS — J9811 Atelectasis: Secondary | ICD-10-CM | POA: Diagnosis present

## 2022-04-07 DIAGNOSIS — E785 Hyperlipidemia, unspecified: Secondary | ICD-10-CM | POA: Diagnosis present

## 2022-04-07 DIAGNOSIS — Z86010 Personal history of colonic polyps: Secondary | ICD-10-CM | POA: Diagnosis not present

## 2022-04-07 DIAGNOSIS — J9601 Acute respiratory failure with hypoxia: Secondary | ICD-10-CM | POA: Diagnosis present

## 2022-04-07 DIAGNOSIS — Z83438 Family history of other disorder of lipoprotein metabolism and other lipidemia: Secondary | ICD-10-CM | POA: Diagnosis not present

## 2022-04-07 DIAGNOSIS — G8929 Other chronic pain: Secondary | ICD-10-CM | POA: Diagnosis present

## 2022-04-07 DIAGNOSIS — I330 Acute and subacute infective endocarditis: Secondary | ICD-10-CM | POA: Diagnosis present

## 2022-04-07 DIAGNOSIS — J441 Chronic obstructive pulmonary disease with (acute) exacerbation: Secondary | ICD-10-CM | POA: Diagnosis present

## 2022-04-07 DIAGNOSIS — N401 Enlarged prostate with lower urinary tract symptoms: Secondary | ICD-10-CM | POA: Diagnosis present

## 2022-04-07 DIAGNOSIS — G4733 Obstructive sleep apnea (adult) (pediatric): Secondary | ICD-10-CM | POA: Diagnosis present

## 2022-04-07 DIAGNOSIS — E669 Obesity, unspecified: Secondary | ICD-10-CM | POA: Diagnosis present

## 2022-04-07 DIAGNOSIS — Z86011 Personal history of benign neoplasm of the brain: Secondary | ICD-10-CM | POA: Diagnosis not present

## 2022-04-07 DIAGNOSIS — Z833 Family history of diabetes mellitus: Secondary | ICD-10-CM | POA: Diagnosis not present

## 2022-04-07 DIAGNOSIS — Z8042 Family history of malignant neoplasm of prostate: Secondary | ICD-10-CM | POA: Diagnosis not present

## 2022-04-07 DIAGNOSIS — I10 Essential (primary) hypertension: Secondary | ICD-10-CM | POA: Diagnosis not present

## 2022-04-07 DIAGNOSIS — Z6834 Body mass index (BMI) 34.0-34.9, adult: Secondary | ICD-10-CM | POA: Diagnosis not present

## 2022-04-07 DIAGNOSIS — I110 Hypertensive heart disease with heart failure: Secondary | ICD-10-CM | POA: Diagnosis present

## 2022-04-07 DIAGNOSIS — Z23 Encounter for immunization: Secondary | ICD-10-CM | POA: Diagnosis present

## 2022-04-07 DIAGNOSIS — Z8261 Family history of arthritis: Secondary | ICD-10-CM | POA: Diagnosis not present

## 2022-04-07 DIAGNOSIS — Z1152 Encounter for screening for COVID-19: Secondary | ICD-10-CM | POA: Diagnosis not present

## 2022-04-07 HISTORY — DX: Acute respiratory failure with hypoxia: J96.01

## 2022-04-07 LAB — BASIC METABOLIC PANEL
Anion gap: 9 (ref 5–15)
BUN: 23 mg/dL (ref 8–23)
CO2: 27 mmol/L (ref 22–32)
Calcium: 8.8 mg/dL — ABNORMAL LOW (ref 8.9–10.3)
Chloride: 103 mmol/L (ref 98–111)
Creatinine, Ser: 1.03 mg/dL (ref 0.61–1.24)
GFR, Estimated: >60 mL/min (ref >60)
Glucose, Bld: 123 mg/dL — ABNORMAL HIGH (ref 70–99)
Potassium: 4.6 mmol/L (ref 3.5–5.1)
Sodium: 139 mmol/L (ref 135–145)

## 2022-04-07 LAB — CBC
HCT: 40.9 % (ref 39.0–52.0)
Hemoglobin: 13.8 g/dL (ref 13.0–17.0)
MCH: 30.9 pg (ref 26.0–34.0)
MCHC: 33.7 g/dL (ref 30.0–36.0)
MCV: 91.5 fL (ref 80.0–100.0)
Platelets: 170 10*3/uL (ref 150–400)
RBC: 4.47 MIL/uL (ref 4.22–5.81)
RDW: 13.5 % (ref 11.5–15.5)
WBC: 5.9 10*3/uL (ref 4.0–10.5)
nRBC: 0 % (ref 0.0–0.2)

## 2022-04-07 MED ORDER — IPRATROPIUM-ALBUTEROL 0.5-2.5 (3) MG/3ML IN SOLN
3.0000 mL | Freq: Four times a day (QID) | RESPIRATORY_TRACT | Status: DC
  Administered 2022-04-07: 3 mL via RESPIRATORY_TRACT
  Filled 2022-04-07: qty 3

## 2022-04-07 NOTE — Progress Notes (Signed)
During AM shift, patient has had two separate incidents of urinary incontinence. Spouse was at bedside during the second episode of incontinence. Patient's spouse verbalized to Probation officer that the urinal was not working and that he wore briefs at home. Due to patient's anatomy, he is not able to wear a condom catheter. Therefore, a male external catheter was placed. Both patient and his spouse were agreeable to this solution to his urinary incontinence.

## 2022-04-07 NOTE — Plan of Care (Signed)
  Problem: Education: Goal: Knowledge of General Education information will improve Description: Including pain rating scale, medication(s)/side effects and non-pharmacologic comfort measures Outcome: Progressing   Problem: Health Behavior/Discharge Planning: Goal: Ability to manage health-related needs will improve Outcome: Progressing   Problem: Clinical Measurements: Goal: Respiratory complications will improve Outcome: Progressing   Problem: Activity: Goal: Risk for activity intolerance will decrease Outcome: Progressing   Problem: Pain Managment: Goal: General experience of comfort will improve Outcome: Progressing   

## 2022-04-07 NOTE — Progress Notes (Addendum)
[facility] PROGRESS NOTE    Progress Note  Kyle Lewis  HEN:[id number] DOB: July 21, 1945 DOA: 04/06/2022 PCP: [name], MD     Brief Narrative:   Kyle Lewis is an 76 y.o. male past medical history significant for essential hypertension, chronic diastolic heart failure, COPD AAA history of tobacco abuse obstructive sleep apnea comes in complaining of progressive shortness of breath and productive cough that started about 2 weeks prior to admission.  EMS in route noted him to be hypoxic was placed on 2 L started on Solu-Medrol and nebs   Assessment/Plan:   Acute respiratory failure with hypoxia secondary to COPD exacerbation: Chest x-ray showed bilateral opacities likely atelectasis. Started on IV Solu-Medrol steroids and antibiotics. Continue inhalers every 4 hours scheduled and as needed. He was started empirically on azithromycin. Started on 2 L of oxygen to keep saturations greater 96%. Continues into spirometry and out of bed to chair.  Essential hypertension: Blood pressure was elevated on admission he was restarted on metoprolol and amlodipine, is improved this morning.  Chronic back pain: Continue narcotics for pain.  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 He was initially treated with penicillins and gentamicin. He is currently on amoxicillin with an undetermined stop date. Continue amoxicillin.  Chronic diastolic heart failure: Appears to be euvolemic.  BPH: Continue Proscar and Flomax, status post transurethral resection.  Hyperlipidemia: Continue statins.  History of tobacco abuse: He reports quitting about 2 years ago.  Obstructive sleep apnea: Continue BiPAP at night.  History of AAA repair: With TAVR's.  Obesity:  DVT prophylaxis: lovenox Family Communication:none Status is: Observation The patient will require care spanning > 2 midnights  and should be moved to inpatient because: Respiratory failure with hypoxia still requiring oxygen moving poor air    Code Status:     Code Status Orders  (From admission, onward)           Start     Ordered   04/06/22 1626  Full code  Continuous        04/06/22 1627           Code Status History     Date Active Date Inactive Code Status Order ID Comments User Context   11/30/2021 1621 12/16/2021 2145 Full Code [id number]  Clance Boll, MD ED   05/27/2021 2309 06/10/2021 1708 Full Code [id number]  Kristopher Oppenheim, DO Inpatient   05/27/2021 1931 05/27/2021 2309 Full Code [id number]  Kristopher Oppenheim, DO ED   01/24/2018 0834 01/24/2018 1420 Full Code [id number]  Nelva Bush, MD Inpatient   09/15/2016 0815 09/15/2016 1528 Full Code [id number]  Nelva Bush, MD Inpatient   04/01/2012 1432 04/04/2012 1518 Full Code [id number]  Cheek, Lester Mead, RN Inpatient         IV Access:   Peripheral IV   Procedures and diagnostic studies:   DG Chest 2 View  Result Date: 04/06/2022 CLINICAL DATA:  Shortness of breath.  History of COPD. EXAM: CHEST - 2 VIEW COMPARISON:  Chest radiographs 03/23/2022 FINDINGS: Sequelae of transcatheter aortic valve replacement are again identified. The cardiomediastinal silhouette is unchanged with normal heart size. Aortic atherosclerosis is noted. There are mild opacities in both lung bases with those on the left being less than on the prior study. No pleural effusion or pneumothorax is identified. Severe degenerative changes are noted at the shoulders. IMPRESSION: Mild bibasilar opacities, improved on the left compared to the prior study  and favored to reflect atelectasis Electronically Signed   By: Logan Bores M.D.   On: 04/06/2022 13:50     Medical Consultants:   None.   Subjective:    Kyle Lewis   Objective:    Vitals:   04/06/22 1751 04/06/22 1805 04/06/22 2029 04/06/22 2218  BP:  (!) 142/79 125/66   Pulse: 74 75 71 72  Resp: '18  17 18 18  '$ Temp:  98.1 F (36.7 C) 98.1 F (36.7 C)   TempSrc:   Oral   SpO2: 95% 95% 96% 96%  Weight:      Height:       SpO2: 96 % O2 Flow Rate (L/min): 2 L/min   Intake/Output Summary (Last 24 hours) at 04/07/2022 0753 Last data filed at 04/06/2022 2200 Gross per 24 hour  Intake 253 ml  Output --  Net 253 ml   Filed Weights   04/06/22 1302  Weight: 98.9 kg    Exam: General exam: In no acute distress, he was short of breath upon sitting up. Respiratory system: Moderate air movement with wheezing bilaterally Cardiovascular system: S1 & S2 heard, RRR. No JVD, murmurs, rubs, gallops or clicks.  Gastrointestinal system: Abdomen is nondistended, soft and nontender.  Extremities: No pedal edema. Skin: No rashes, lesions or ulcers Psychiatry: Judgement and insight appear normal. Mood & affect appropriate.    Data Reviewed:    Labs: Basic Metabolic Panel: Recent Labs  Lab 04/06/22 1311 04/07/22 0304  NA 142 139  K 4.0 4.6  CL 104 103  CO2 27 27  GLUCOSE 116* 123*  BUN 18 23  CREATININE 0.83 1.03  CALCIUM 9.0 8.8*   GFR Estimated Creatinine Clearance: 69.4 mL/min (by C-G formula based on SCr of 1.03 mg/dL). Liver Function Tests: No results for input(s): "AST", "ALT", "ALKPHOS", "BILITOT", "PROT", "ALBUMIN" in the last 168 hours. No results for input(s): "LIPASE", "AMYLASE" in the last 168 hours. No results for input(s): "AMMONIA" in the last 168 hours. Coagulation profile No results for input(s): "INR", "PROTIME" in the last 168 hours. COVID-19 Labs  No results for input(s): "DDIMER", "FERRITIN", "LDH", "CRP" in the last 72 hours.  Lab Results  Component Value Date   SARSCOV2NAA NEGATIVE 04/06/2022   [place] NEGATIVE 11/30/2021   [place] NEGATIVE 05/27/2021   SARSCOV2NAA NOT DETECTED 05/20/2021    CBC: Recent Labs  Lab 04/06/22 1311 04/07/22 0304  WBC 8.1 5.9  NEUTROABS 5.7  --   HGB 13.9 13.8  HCT 41.7 40.9  MCV 93.1 91.5  PLT 174 170    Cardiac Enzymes: No results for input(s): "CKTOTAL", "CKMB", "CKMBINDEX", "TROPONINI" in the last 168 hours. BNP (last 3 results) No results for input(s): "PROBNP" in the last 8760 hours. CBG: No results for input(s): "GLUCAP" in the last 168 hours. D-Dimer: No results for input(s): "DDIMER" in the last 72 hours. Hgb A1c: No results for input(s): "HGBA1C" in the last 72 hours. Lipid Profile: No results for input(s): "CHOL", "HDL", "LDLCALC", "TRIG", "CHOLHDL", "LDLDIRECT" in the last 72 hours. Thyroid function studies: No results for input(s): "TSH", "T4TOTAL", "T3FREE", "THYROIDAB" in the last 72 hours.  Invalid input(s): "FREET3" Anemia work up: No results for input(s): "VITAMINB12", "FOLATE", "FERRITIN", "TIBC", "IRON", "RETICCTPCT" in the last 72 hours. Sepsis Labs: Recent Labs  Lab 04/06/22 1311 04/07/22 0304  WBC 8.1 5.9   Microbiology Recent Results (from the past 240 hour(s))  Resp Panel by RT-PCR (Flu A&B, Covid) Anterior Nasal Swab     Status: None  Collection Time: 04/06/22  1:11 PM   Specimen: Anterior Nasal Swab  Result Value Ref Range Status   SARS Coronavirus 2 by RT PCR NEGATIVE NEGATIVE Final    Comment: (NOTE) SARS-CoV-2 target nucleic acids are NOT DETECTED.  The SARS-CoV-2 RNA is generally detectable in upper respiratory specimens during the acute phase of infection. The lowest concentration of SARS-CoV-2 viral copies this assay can detect is 138 copies/mL. A negative result does not preclude SARS-Cov-2 infection and should not be used as the sole basis for treatment or other patient management decisions. A negative result may occur with  improper specimen collection/handling, submission of specimen other than nasopharyngeal swab, presence of viral mutation(s) within the areas targeted by this assay, and inadequate number of viral copies(<138 copies/mL). A negative result must be combined with clinical observations, patient history, and  epidemiological information. The expected result is Negative.  Fact Sheet for Patients:  EntrepreneurPulse.com.au  Fact Sheet for Healthcare Providers:  IncredibleEmployment.be  This test is no t yet approved or cleared by the [place] FDA and  has been authorized for detection and/or diagnosis of SARS-CoV-2 by FDA under an Emergency Use Authorization (EUA). This EUA will remain  in effect (meaning this test can be used) for the duration of the COVID-19 declaration under Section 564(b)(1) of the Act, 21 U.S.C.section 360bbb-3(b)(1), unless the authorization is terminated  or revoked sooner.       Influenza A by PCR NEGATIVE NEGATIVE Final   Influenza B by PCR NEGATIVE NEGATIVE Final    Comment: (NOTE) The Xpert Xpress SARS-CoV-2/FLU/RSV plus assay is intended as an aid in the diagnosis of influenza from Nasopharyngeal swab specimens and should not be used as a sole basis for treatment. Nasal washings and aspirates are unacceptable for Xpert Xpress SARS-CoV-2/FLU/RSV testing.  Fact Sheet for Patients: EntrepreneurPulse.com.au  Fact Sheet for Healthcare Providers: IncredibleEmployment.be  This test is not yet approved or cleared by the [place] FDA and has been authorized for detection and/or diagnosis of SARS-CoV-2 by FDA under an Emergency Use Authorization (EUA). This EUA will remain in effect (meaning this test can be used) for the duration of the COVID-19 declaration under Section 564(b)(1) of the Act, 21 U.S.C. section 360bbb-3(b)(1), unless the authorization is terminated or revoked.  Performed at [facility], [place] [address] [place], [place] [address]      Medications:    amLODipine  10 mg Oral Daily   amoxicillin  500 mg Oral TID   atorvastatin  40 mg Oral Daily   enoxaparin (LOVENOX) injection  0.5 mg/kg Subcutaneous Q24H   finasteride  5 mg Oral Daily   gabapentin   300 mg Oral BID   guaiFENesin  600 mg Oral BID   ipratropium-albuterol  3 mL Nebulization QID   metoprolol tartrate  25 mg Oral BID   predniSONE  40 mg Oral Q breakfast   saccharomyces boulardii  250 mg Oral BID   sertraline  50 mg Oral Daily   sodium chloride flush  3 mL Intravenous Q12H   tamsulosin  0.4 mg Oral QPC supper   Continuous Infusions:  azithromycin 500 mg (04/06/22 2037)      LOS: 0 days   Charlynne Cousins  [facility]  04/07/2022, 7:53 AM

## 2022-04-07 NOTE — Progress Notes (Signed)
RT was called to provide education regarding bipap mask to patient. RT instructed on proper method to apply mask to face and how to adjust the straps, clips and velcro on BIPAP mask. Pt verbalized understanding but had difficulty applying mask himself. RT instructed pt to have RN call RT for assistance when pt is ready to wear mask, and/or have his wife bring his CPAP from home to wear here if he is more comfortable doing that. RT instructed pt to have RT called if any further assistance is needed. RT will monitor as needed.

## 2022-04-07 NOTE — Progress Notes (Signed)
While patient's spouse was visiting, patient verbalized concerns with wearing CPAP at bedtime. Patient stated "I worked with that thing for at least a hour and couldn't get it to fit right". Patient's spouse verbalized that the hospital mask was a little different than the one the patient uses at home. Writer spoke with Broadus John, RRT, who agreed to come bedside and assist patient with fitment issues with regard to CPAP mask.

## 2022-04-07 NOTE — Evaluation (Signed)
Physical Therapy Evaluation Patient Details Name: Kyle Lewis MRN: [id number] DOB: Nov 09, 1945 Today's Date: 04/07/2022  History of Present Illness  Pt is a 76 y.o. male admitted 11/2 for COPD exacerbation. PMH: HTN, COPD, OSA, CAD, AAA, chronic back pain, TVAR endocarditis, lumbar discitis with epidural abscess L5-S1   Clinical Impression  Pt admitted with above diagnosis. PTA pt lived at home with his wife, mod I mobility with RW vs rollator household distances, active with HHPT.  Pt currently with functional limitations due to the deficits listed below (see PT Problem List). On eval, pt required min assist bed mobility, min guard assist transfers, and min guard assist amb 25' with RW. He required 3L O2 to maintain SpO2 94% during mobility. SpO2 97% at rest on 2L. Pt will benefit from skilled PT to increase their independence and safety with mobility to allow discharge to the venue listed below.          Recommendations for follow up therapy are one component of a multi-disciplinary discharge planning process, led by the attending physician.  Recommendations may be updated based on patient status, additional functional criteria and insurance authorization.  Follow Up Recommendations Home health PT      Assistance Recommended at Discharge PRN  Patient can return home with the following       Equipment Recommendations None recommended by PT  Recommendations for Other Services       Functional Status Assessment Patient has had a recent decline in their functional status and demonstrates the ability to make significant improvements in function in a reasonable and predictable amount of time.     Precautions / Restrictions Precautions Precautions: Fall;Other (comment) Precaution Comments: watch sats      Mobility  Bed Mobility Overal bed mobility: Needs Assistance Bed Mobility: Supine to Sit     Supine to sit: Min assist, HOB elevated     General bed mobility comments:  +rail, increased time    Transfers Overall transfer level: Needs assistance Equipment used: Rolling walker (2 wheels) Transfers: Sit to/from Stand Sit to Stand: Min guard                Ambulation/Gait Ambulation/Gait assistance: Min guard Gait Distance (Feet): 25 Feet Assistive device: Rolling walker (2 wheels) Gait Pattern/deviations: Step-through pattern, Trunk flexed, Decreased stride length Gait velocity: decreased     General Gait Details: Amb on RA with desat to 85%. 3L during mobility to maintain SpO2 94%  Stairs            Wheelchair Mobility    Modified Rankin (Stroke Patients Only)       Balance Overall balance assessment: Needs assistance Sitting-balance support: No upper extremity supported, Feet supported Sitting balance-Leahy Scale: Good     Standing balance support: Bilateral upper extremity supported, During functional activity, Reliant on assistive device for balance Standing balance-Leahy Scale: Poor                               Pertinent Vitals/Pain Pain Assessment Pain Assessment: No/denies pain    Home Living Family/patient expects to be discharged to:: Private residence Living Arrangements: Spouse/significant other Available Help at Discharge: Family;Available 24 hours/day (supervision level only) Type of Home: House Home Access: Ramped entrance       Home Layout: One level Home Equipment: Noank - single Barista (2 wheels);Rollator (4 wheels);Grab bars - tub/shower;Shower seat Additional Comments: Does not wear home O2  Prior Function Prior Level of Function : Needs assist             Mobility Comments: RW vs rollator for household mobility. Active with HHPT ADLs Comments: Wife performs iADLs and assists with basic ADLs, if needed     Hand Dominance   Dominant Hand: Right    Extremity/Trunk Assessment   Upper Extremity Assessment Upper Extremity Assessment: Overall WFL for tasks  assessed    Lower Extremity Assessment Lower Extremity Assessment: Generalized weakness    Cervical / Trunk Assessment Cervical / Trunk Assessment: Kyphotic  Communication   Communication: No difficulties  Cognition Arousal/Alertness: Awake/alert Behavior During Therapy: WFL for tasks assessed/performed Overall Cognitive Status: Within Functional Limits for tasks assessed                                          General Comments General comments (skin integrity, edema, etc.): SpO2 97% at rest on 2L. Desat to 88% at rest on RA. 3L during amb to maintain SpO2 94%    Exercises     Assessment/Plan    PT Assessment Patient needs continued PT services  PT Problem List Decreased strength;Decreased balance;Decreased mobility;Cardiopulmonary status limiting activity;Decreased activity tolerance       PT Treatment Interventions Functional mobility training;Balance training;Patient/family education;Gait training;Therapeutic activities;Therapeutic exercise    PT Goals (Current goals can be found in the Care Plan section)  Acute Rehab PT Goals Patient Stated Goal: home PT Goal Formulation: With patient Time For Goal Achievement: 04/21/22 Potential to Achieve Goals: Good    Frequency Min 3X/week     Co-evaluation               AM-PAC PT "6 Clicks" Mobility  Outcome Measure Help needed turning from your back to your side while in a flat bed without using bedrails?: A Little Help needed moving from lying on your back to sitting on the side of a flat bed without using bedrails?: A Little Help needed moving to and from a bed to a chair (including a wheelchair)?: A Little Help needed standing up from a chair using your arms (e.g., wheelchair or bedside chair)?: A Little Help needed to walk in hospital room?: A Little Help needed climbing 3-5 steps with a railing? : A Lot 6 Click Score: 17    End of Session Equipment Utilized During Treatment: Gait  belt Activity Tolerance: Patient tolerated treatment well Patient left: in chair;with call bell/phone within reach Nurse Communication: Mobility status PT Visit Diagnosis: Muscle weakness (generalized) (M62.81);Difficulty in walking, not elsewhere classified (R26.2)    Time: 0867-6195 PT Time Calculation (min) (ACUTE ONLY): 24 min   Charges:   PT Evaluation $PT Eval Moderate Complexity: 1 Mod PT Treatments $Gait Training: 8-22 mins        Lorrin Goodell, PT  Office # [phone] Pager [phone]   Lorriane Shire 04/07/2022, 8:38 AM

## 2022-04-07 NOTE — Progress Notes (Signed)
Patient's spouse is here bedside and asked if patient is getting Lasix 40 mg daily. Writer advised her that patient has no orders for Lasix. Patient's spouse stated she has been giving him one out her medication supply at home. Advised hospitalist who stated patient would not be given Lasix due to dehydration. Writer advised patient's spouse of this information. She verbalized understanding.

## 2022-04-07 NOTE — Progress Notes (Signed)
PT Progress Note  SATURATION QUALIFICATIONS: (This note is used to comply with regulatory documentation for home oxygen)  Patient Saturations on Room Air at Rest = 88%  Patient Saturations on Room Air while Ambulating = 85%  Patient Saturations on 3 Liters of oxygen while Ambulating = 94%  Please briefly explain why patient needs home oxygen:  Supplemental O2 required to maintain SpO2 > 90% at rest and during mobility. SpO2 97% at rest on 2L.   Lorrin Goodell, PT  Office # [phone] Pager [phone]

## 2022-04-07 NOTE — TOC Initial Note (Signed)
Transition of Care [facility]) - Initial/Assessment Note    Patient Details  Name: Kyle Lewis MRN: [id number] Date of Birth: Jul 03, 1945  Transition of Care [facility] CM/SW Contact:    Tom-Johnson, Renea Ee, RN Phone Number: 04/07/2022, 2:26 PM  Clinical Narrative:                  CM spoke with patient at bedside about needs for post hospital transition. Admitted for COPD Exacerbation. Currently on 3L O2, does not use home O2. Uses CPAP at home and got machine from [facility]. Ambulation sats done by RN and PT, MD notified to put in order if home O2 needed.  Patient is from home with wife, has two supportive children. Has a cane, rollator, shower seat and CPAP at home.  Patient's current PCP is [facility] Heck, Lillette Boxer, MD.  Patient states he has been accepted at a new practice where they will be doing home visits but could not remember the name of practice.  Home health recommended and patient chose [facility] from Medicare.gov list. Claiborne Billings voiced acceptance, info on AVS.  CM will continue to follow as patient progresses with care towards discharge.   Expected Discharge Plan: [place] Barriers to Discharge: Continued Medical Work up   Patient Goals and CMS Choice Patient states their goals for this hospitalization and ongoing recovery are:: To return home CMS Medicare.gov Compare Post Acute Care list provided to:: Patient Choice offered to / list presented to : Patient  Expected Discharge Plan and Services Expected Discharge Plan: [place]   Discharge Planning Services: CM Consult Post Acute Care Choice: [place] arrangements for the past 2 months: Single Family Home                 DME Arranged: N/A DME Agency: NA       HH Arranged: PT HH Agency: [place] Date [place]: 04/07/22 Time HH Agency Contacted: 53 Representative spoke with at [place]: Claiborne Billings  Prior Living Arrangements/Services Living  arrangements for the past 2 months: [place] with:: Spouse Patient language and need for interpreter reviewed:: Yes Do you feel safe going back to the place where you live?: Yes      Need for Family Participation in Patient Care: Yes (Comment) Care giver support system in place?: Yes (comment) Current home services: DME (Cane, walker, shower seat, CPAP) Criminal Activity/Legal Involvement Pertinent to Current Situation/Hospitalization: No - Comment as needed  Activities of Daily Living Home Assistive Devices/Equipment: Shower chair with back ADL Screening (condition at time of admission) Patient's cognitive ability adequate to safely complete daily activities?: Yes Is the patient deaf or have difficulty hearing?: Yes Does the patient have difficulty seeing, even when wearing glasses/contacts?: Yes Does the patient have difficulty concentrating, remembering, or making decisions?: No Patient able to express need for assistance with ADLs?: Yes Does the patient have difficulty dressing or bathing?: No Independently performs ADLs?: Yes (appropriate for developmental age) Does the patient have difficulty walking or climbing stairs?: Yes Weakness of Legs: Both Weakness of Arms/Hands: None  Permission Sought/Granted Permission sought to share information with : Case Manager, Customer service manager, Family Supports Permission granted to share information with : Yes, Verbal Permission Granted              Emotional Assessment Appearance:: Appears stated age Attitude/Demeanor/Rapport: Engaged, Gracious Affect (typically observed): Accepting, Appropriate, Calm, Hopeful, Pleasant Orientation: : Oriented to Self, Oriented to Place, Oriented  to  Time, Oriented to Situation Alcohol / Substance Use: Not Applicable Psych Involvement: No (comment)  Admission diagnosis:  COPD exacerbation ([place]) [J44.1] Acute respiratory failure with hypoxemia (HCC) [J96.01] Patient  Active Problem List   Diagnosis Date Noted   Acute respiratory failure with hypoxemia ([place]) 04/07/2022   COPD exacerbation ([place]) 04/06/2022   History of bacteremia 04/06/2022   Atherosclerosis of aorta ([place]) 04/03/2022   Epidural abscess 01/26/2022   Pyogenic arthritis of shoulder region ([place]) 01/26/2022   Accretions on teeth    Chronic periodontitis    Teeth missing    [place]    Excessive dental attrition    Abfraction    Encounter for dental examination    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   Accelerated hypertension 12/01/2021   Acute respiratory failure with hypoxia ([place]) 12/01/2021   COPD with acute exacerbation ([place]) 12/01/2021   CAP (community acquired pneumonia) 11/30/2021   H/O abdominal aortic aneurysm repair 11/09/2021   Olecranon bursitis of left elbow 11/08/2021   AAA (abdominal aortic aneurysm) ([place]) 11/07/2021   Abscess of left leg 11/07/2021   Adrenal mass ([place]) 11/07/2021   Allergy 11/07/2021   Arthritis 11/07/2021   Carotid artery occlusion 11/07/2021   Degeneration of cervical intervertebral disc 11/07/2021   Diverticulosis 11/07/2021   Dyspnea 11/07/2021   Heart murmur 11/07/2021   History of back surgery 11/07/2021   History of left knee replacement 11/07/2021   Meningioma ([place]) 11/07/2021   Ringing in ear 11/07/2021   Impairment of balance 10/27/2021   Muscle weakness 10/27/2021   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   Mitral regurgitation 06/02/2021   Chronic diastolic CHF (congestive heart failure) ([place])    Acute respiratory failure with hypoxia and hypercapnia ([place]) 05/27/2021   Anxiety 04/08/2020   Body mass index (BMI) 34.0-34.9, adult 04/07/2020   Abnormal gait 07/31/2019   Inflammation of sacroiliac joint ([place]) 05/06/2019   COPD GOLD II 04/07/2019   Neck pain 03/25/2019   Preop cardiovascular exam 10/24/2018   Cigarette smoker 09/05/2018   CAD (coronary artery disease) 02/01/2018   Severe aortic  stenosis 02/01/2018   Status post AAA (abdominal aortic aneurysm) repair 02/01/2018   Pre-procedure lab exam 01/19/2018   Hyperlipidemia LDL goal <70 01/19/2018   History of craniotomy 01/11/2018   Blister of multiple sites of lower extremity 01/10/2018   Adenomatous colon polyp 01/10/2018   Other specified postprocedural states 09/25/2017   Osteoarthritis of right glenohumeral joint 08/14/2017   Lower urinary tract symptoms (LUTS) 08/06/2017   S/P lumbar spinal fusion 07/07/2015   Iliac artery aneurysm, left ([place]) 07/01/2015   Peripheral vascular disease ([place]) 07/01/2015   Degeneration of lumbar intervertebral disc 06/14/2015   History of right MCA stroke 07/21/2014   OAB (overactive bladder) 01/19/2014   Stroke ([place]) 01/01/2014   Exogenous obesity 11/08/2013   Hypertonicity of bladder 11/08/2013   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   Microscopic hematuria 11/08/2013   History of tobacco abuse 11/08/2013   Nocturia 11/08/2013   Ventral hernia 11/08/2013   Carotid arterial disease ([place]) 05/23/2012   Hypertension    OSA (obstructive sleep apnea)    Chronic back pain    BPH with obstruction/lower urinary tract symptoms    OA (osteoarthritis) of knee 04/01/2012   Depression 03/17/2010   DIVERTICULOSIS, COLON 03/17/2010   COLONIC POLYPS, ADENOMATOUS, HX OF 03/17/2010   PCP:  [name], MD Pharmacy:   [facility] [place], [place] [address] [place]  [place] [address] Phone: [phone] Fax: [phone]  [facility] [address] [place] [address] Phone: [phone] Fax: [phone]  [facility] [place], [place] - [address] AT [place] [address] [place] [address] Phone: [phone] Fax: [phone]  [facility] [place], [place] AT [address]  [address] Phone: [phone] Fax: [phone]  [id number] - [place], [address] AT [place] [address] [place] [address] Phone: [phone] Fax: [phone]     Social Determinants of Health (SDOH) Interventions    Readmission Risk Interventions    12/16/2021    2:11 PM 06/10/2021   10:04 AM  Readmission Risk Prevention Plan  Transportation Screening Complete Complete  PCP or Specialist Appt within 5-7 Days Complete Complete  Home Care Screening Complete Complete  Medication Review (RN CM) Complete Complete

## 2022-04-07 NOTE — Progress Notes (Signed)
RN progress note. Ambulating with front wheel walker. Pain 2/10 after PRN pain medication and scheduled Gabapentin.   SATURATION QUALIFICATIONS: (This note is used to comply with regulatory documentation for home oxygen)   Patient Saturations on Room Air at Rest = 90 %   Patient Saturations on Room Air while Ambulating = 83%   Patient Saturations on 2 Liters of oxygen while Ambulating = 99%   Please briefly explain why patient needs home oxygen:  Supplemental O2 required to maintain SpO2 > 90% at rest and during mobility. SpO2 98% at rest on 2L.

## 2022-04-08 DIAGNOSIS — I10 Essential (primary) hypertension: Secondary | ICD-10-CM | POA: Diagnosis not present

## 2022-04-08 DIAGNOSIS — J9601 Acute respiratory failure with hypoxia: Secondary | ICD-10-CM | POA: Diagnosis not present

## 2022-04-08 DIAGNOSIS — I5032 Chronic diastolic (congestive) heart failure: Secondary | ICD-10-CM | POA: Diagnosis not present

## 2022-04-08 DIAGNOSIS — J441 Chronic obstructive pulmonary disease with (acute) exacerbation: Secondary | ICD-10-CM | POA: Diagnosis not present

## 2022-04-08 MED ORDER — IPRATROPIUM-ALBUTEROL 0.5-2.5 (3) MG/3ML IN SOLN
3.0000 mL | Freq: Three times a day (TID) | RESPIRATORY_TRACT | Status: DC
  Administered 2022-04-08 – 2022-04-09 (×5): 3 mL via RESPIRATORY_TRACT
  Filled 2022-04-08 (×6): qty 3

## 2022-04-08 NOTE — Plan of Care (Signed)
  Problem: Health Behavior/Discharge Planning: Goal: Ability to manage health-related needs will improve Outcome: Progressing   Problem: Clinical Measurements: Goal: Respiratory complications will improve Outcome: Progressing   Problem: Activity: Goal: Risk for activity intolerance will decrease Outcome: Progressing   Problem: Coping: Goal: Level of anxiety will decrease Outcome: Progressing   Problem: Pain Managment: Goal: General experience of comfort will improve Outcome: Progressing

## 2022-04-08 NOTE — Progress Notes (Signed)
Physical Therapy Treatment Patient Details Name: Kyle Lewis MRN: [id number] DOB: August 30, 1945 Today's Date: 04/08/2022   History of Present Illness Pt is a 76 y.o. male admitted 11/2 for COPD exacerbation. PMH: HTN, COPD, OSA, CAD, AAA, chronic back pain, TVAR endocarditis, lumbar discitis with epidural abscess L5-S1.    PT Comments    Pt received in recliner, agreeable to therapy session and with good participation and tolerance for gait and transfer training. Pt needing up to min guard for safety and reminders for use of rollator brakes, pt SpO2 92% and above on 3L O2 [place] with exertional tasks and resting in chair. Reviewed seated/standing exercises with him and encouraged him to perform if another person in room to supervise for safety with OOB mobility. Pt able to demo back IS ~1,000 mL. Pt continues to benefit from PT services to progress toward functional mobility goals.   Recommendations for follow up therapy are one component of a multi-disciplinary discharge planning process, led by the attending physician.  Recommendations may be updated based on patient status, additional functional criteria and insurance authorization.  Follow Up Recommendations  Home health PT     Assistance Recommended at Discharge PRN  Patient can return home with the following Help with stairs or ramp for entrance   Equipment Recommendations  None recommended by PT    Recommendations for Other Services       Precautions / Restrictions Precautions Precautions: Fall;Other (comment) Precaution Comments: watch sats Restrictions Weight Bearing Restrictions: No     Mobility  Bed Mobility Overal bed mobility: Needs Assistance             General bed mobility comments: pt received in recliner    Transfers Overall transfer level: Needs assistance Equipment used: Rolling walker (2 wheels) Transfers: Sit to/from Stand Sit to Stand: Min guard           General transfer comment: pt  unlocked rollator after therapist locked it and placed it in front of him prior to standing; pt reminded to lock it prior to getting up and unlock it once standing    Ambulation/Gait Ambulation/Gait assistance: Min guard, Supervision Gait Distance (Feet): 100 Feet Assistive device: Rollator (4 wheels) Gait Pattern/deviations: Step-through pattern, Trunk flexed, Decreased stride length Gait velocity: decreased     General Gait Details: Amb on 3L/min O2 [place] reading 92%, DOE 2/4. DOE improves with seated break. mild fatigue (~3/10 modified RPE)   Stairs Stairs:  (pt has a ramp entry)           Wheelchair Mobility    Modified Rankin (Stroke Patients Only)       Balance Overall balance assessment: Mild deficits observed, not formally tested         Standing balance support: Reliant on assistive device for balance                                Cognition Arousal/Alertness: Awake/alert Behavior During Therapy: WFL for tasks assessed/performed Overall Cognitive Status: Within Functional Limits for tasks assessed                                 General Comments: needs safety cues (pt unlocks rollator prior to standing)        Exercises Other Exercises Other Exercises: chair push-ups x10 reps Other Exercises: seated BLE AROM: LAQ x10 reps ea Other Exercises: reviewed  standing/supine LE exercises IS ~1,000 mL x 5 reps       Pertinent Vitals/Pain Pain Assessment Pain Assessment: No/denies pain     PT Goals (current goals can now be found in the care plan section) Acute Rehab PT Goals Patient Stated Goal: home PT Goal Formulation: With patient Time For Goal Achievement: 04/21/22 Progress towards PT goals: Progressing toward goals    Frequency    Min 3X/week      PT Plan Current plan remains appropriate       AM-PAC PT "6 Clicks" Mobility   Outcome Measure  Help needed turning from your back to your side while in a flat  bed without using bedrails?: None Help needed moving from lying on your back to sitting on the side of a flat bed without using bedrails?: A Little Help needed moving to and from a bed to a chair (including a wheelchair)?: A Little Help needed standing up from a chair using your arms (e.g., wheelchair or bedside chair)?: A Little Help needed to walk in hospital room?: A Little Help needed climbing 3-5 steps with a railing? : A Little 6 Click Score: 19    End of Session Equipment Utilized During Treatment: Gait belt;Oxygen Activity Tolerance: Patient tolerated treatment well Patient left: in chair;with call bell/phone within reach;with family/visitor present (spouse and NT in room) Nurse Communication: Mobility status PT Visit Diagnosis: Muscle weakness (generalized) (M62.81);Difficulty in walking, not elsewhere classified (R26.2)     Time: 6712-4580 PT Time Calculation (min) (ACUTE ONLY): 12 min  Charges:  $Gait Training: 8-22 mins                     Dulcey Riederer P., PTA Acute Rehabilitation Services Secure Chat Preferred 9a-5:30pm Office: [place] 04/08/2022, 5:39 PM

## 2022-04-08 NOTE — Progress Notes (Signed)
[facility] PROGRESS NOTE    Progress Note  JEFERSON BOOZER  JEH:[id number] DOB: 1946/01/13 DOA: 04/06/2022 PCP: [name], MD     Brief Narrative:   Kyle Lewis is an 76 y.o. male past medical history significant for essential hypertension, chronic diastolic heart failure, COPD AAA history of tobacco abuse obstructive sleep apnea comes in complaining of progressive shortness of breath and productive cough that started about 2 weeks prior to admission.  EMS in route noted him to be hypoxic was placed on 2 L started on Solu-Medrol and nebs   Assessment/Plan:   Acute respiratory failure with hypoxia secondary to COPD exacerbation: Chest x-ray showed bilateral opacities likely atelectasis. Started on IV Solu-Medrol steroids and antibiotics. Still with 2 L of oxygen to keep saturations greater than 88%. Relates he still short of breath but significantly improved compared to yesterday. Try to wean to room air continue inhalers. Continues into spirometry and out of bed to chair.  Essential hypertension: Blood pressure was elevated on admission he was restarted on metoprolol and amlodipine, is improved this morning.  Chronic back pain: Continue narcotics for pain.  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 He was initially treated with penicillins and gentamicin. He is currently on amoxicillin with an undetermined stop date. Continue amoxicillin.  Chronic diastolic heart failure: Appears to be euvolemic.  BPH: Continue Proscar and Flomax, status post transurethral resection.  Hyperlipidemia: Continue statins.  History of tobacco abuse: He reports quitting about 2 years ago.  Obstructive sleep apnea: Continue BiPAP at night.  History of AAA repair: With TAVR's.  Obesity:  DVT prophylaxis: lovenox Family Communication:none Status is: Observation The patient will require  care spanning > 2 midnights and should be moved to inpatient because: Respiratory failure with hypoxia still requiring oxygen moving poor air    Code Status:     Code Status Orders  (From admission, onward)           Start     Ordered   04/06/22 1626  Full code  Continuous        04/06/22 1627           Code Status History     Date Active Date Inactive Code Status Order ID Comments User Context   11/30/2021 1621 12/16/2021 2145 Full Code [id number]  Clance Boll, MD ED   05/27/2021 2309 06/10/2021 1708 Full Code [id number]  Kristopher Oppenheim, DO Inpatient   05/27/2021 1931 05/27/2021 2309 Full Code [id number]  Kristopher Oppenheim, DO ED   01/24/2018 0834 01/24/2018 1420 Full Code [id number]  Nelva Bush, MD Inpatient   09/15/2016 0815 09/15/2016 1528 Full Code [id number]  Nelva Bush, MD Inpatient   04/01/2012 1432 04/04/2012 1518 Full Code [id number]  Cheek, Lester [place], RN Inpatient         IV Access:   Peripheral IV   Procedures and diagnostic studies:   DG Chest 2 View  Result Date: 04/06/2022 CLINICAL DATA:  Shortness of breath.  History of COPD. EXAM: CHEST - 2 VIEW COMPARISON:  Chest radiographs 03/23/2022 FINDINGS: Sequelae of transcatheter aortic valve replacement are again identified. The cardiomediastinal silhouette is unchanged with normal heart size. Aortic atherosclerosis is noted. There are mild opacities in both lung bases with those on the left being less than on the prior study. No pleural effusion or pneumothorax is identified. Severe degenerative changes are noted at the shoulders. IMPRESSION: Mild bibasilar opacities, improved on the  left compared to the prior study and favored to reflect atelectasis Electronically Signed   By: Logan Bores M.D.   On: 04/06/2022 13:50     Medical Consultants:   None.   Subjective:    HASEEB FIALLOS relates his breathing is significantly better than yesterday but still short of breath.  Objective:    Vitals:    04/07/22 1551 04/07/22 1929 04/08/22 0334 04/08/22 0748  BP: 135/78 124/76 (!) 135/97 (!) 148/80  Pulse: 64 66 (!) 56 (!) 55  Resp: '17 20 19 19  '$ Temp: 98.1 F (36.7 C) 98.3 F (36.8 C) 97.7 F (36.5 C) (!) 97.5 F (36.4 C)  TempSrc: Oral  Oral Oral  SpO2: 98% 99% 98% 97%  Weight:      Height:       SpO2: 97 % O2 Flow Rate (L/min): 2 L/min   Intake/Output Summary (Last 24 hours) at 04/08/2022 0901 Last data filed at 04/07/2022 2024 Gross per 24 hour  Intake 3 ml  Output 250 ml  Net -247 ml    Filed Weights   04/06/22 1302  Weight: 98.9 kg    Exam: General exam: In no acute distress. Respiratory system: Good air movement with wheezing bilaterally Cardiovascular system: S1 & S2 heard, RRR. No JVD. Gastrointestinal system: Abdomen is nondistended, soft and nontender.  Extremities: No pedal edema. Skin: No rashes, lesions or ulcers Psychiatry: Judgement and insight appear normal. Mood & affect appropriate.   Data Reviewed:    Labs: Basic Metabolic Panel: Recent Labs  Lab 04/06/22 1311 04/07/22 0304  NA 142 139  K 4.0 4.6  CL 104 103  CO2 27 27  GLUCOSE 116* 123*  BUN 18 23  CREATININE 0.83 1.03  CALCIUM 9.0 8.8*    GFR Estimated Creatinine Clearance: 69.4 mL/min (by C-G formula based on SCr of 1.03 mg/dL). Liver Function Tests: No results for input(s): "AST", "ALT", "ALKPHOS", "BILITOT", "PROT", "ALBUMIN" in the last 168 hours. No results for input(s): "LIPASE", "AMYLASE" in the last 168 hours. No results for input(s): "AMMONIA" in the last 168 hours. Coagulation profile No results for input(s): "INR", "PROTIME" in the last 168 hours. COVID-19 Labs  No results for input(s): "DDIMER", "FERRITIN", "LDH", "CRP" in the last 72 hours.  Lab Results  Component Value Date   SARSCOV2NAA NEGATIVE 04/06/2022   [place] NEGATIVE 11/30/2021   [place] NEGATIVE 05/27/2021   SARSCOV2NAA NOT DETECTED 05/20/2021    CBC: Recent Labs  Lab  04/06/22 1311 04/07/22 0304  WBC 8.1 5.9  NEUTROABS 5.7  --   HGB 13.9 13.8  HCT 41.7 40.9  MCV 93.1 91.5  PLT 174 170    Cardiac Enzymes: No results for input(s): "CKTOTAL", "CKMB", "CKMBINDEX", "TROPONINI" in the last 168 hours. BNP (last 3 results) No results for input(s): "PROBNP" in the last 8760 hours. CBG: No results for input(s): "GLUCAP" in the last 168 hours. D-Dimer: No results for input(s): "DDIMER" in the last 72 hours. Hgb A1c: No results for input(s): "HGBA1C" in the last 72 hours. Lipid Profile: No results for input(s): "CHOL", "HDL", "LDLCALC", "TRIG", "CHOLHDL", "LDLDIRECT" in the last 72 hours. Thyroid function studies: No results for input(s): "TSH", "T4TOTAL", "T3FREE", "THYROIDAB" in the last 72 hours.  Invalid input(s): "FREET3" Anemia work up: No results for input(s): "VITAMINB12", "FOLATE", "FERRITIN", "TIBC", "IRON", "RETICCTPCT" in the last 72 hours. Sepsis Labs: Recent Labs  Lab 04/06/22 1311 04/07/22 0304  WBC 8.1 5.9    Microbiology Recent Results (from the past 240 hour(s))  Resp Panel by RT-PCR (Flu A&B, Covid) Anterior Nasal Swab     Status: None   Collection Time: 04/06/22  1:11 PM   Specimen: Anterior Nasal Swab  Result Value Ref Range Status   SARS Coronavirus 2 by RT PCR NEGATIVE NEGATIVE Final    Comment: (NOTE) SARS-CoV-2 target nucleic acids are NOT DETECTED.  The SARS-CoV-2 RNA is generally detectable in upper respiratory specimens during the acute phase of infection. The lowest concentration of SARS-CoV-2 viral copies this assay can detect is 138 copies/mL. A negative result does not preclude SARS-Cov-2 infection and should not be used as the sole basis for treatment or other patient management decisions. A negative result may occur with  improper specimen collection/handling, submission of specimen other than nasopharyngeal swab, presence of viral mutation(s) within the areas targeted by this assay, and inadequate  number of viral copies(<138 copies/mL). A negative result must be combined with clinical observations, patient history, and epidemiological information. The expected result is Negative.  Fact Sheet for Patients:  EntrepreneurPulse.com.au  Fact Sheet for Healthcare Providers:  IncredibleEmployment.be  This test is no t yet approved or cleared by the [place] FDA and  has been authorized for detection and/or diagnosis of SARS-CoV-2 by FDA under an Emergency Use Authorization (EUA). This EUA will remain  in effect (meaning this test can be used) for the duration of the COVID-19 declaration under Section 564(b)(1) of the Act, 21 U.S.C.section 360bbb-3(b)(1), unless the authorization is terminated  or revoked sooner.       Influenza A by PCR NEGATIVE NEGATIVE Final   Influenza B by PCR NEGATIVE NEGATIVE Final    Comment: (NOTE) The Xpert Xpress SARS-CoV-2/FLU/RSV plus assay is intended as an aid in the diagnosis of influenza from Nasopharyngeal swab specimens and should not be used as a sole basis for treatment. Nasal washings and aspirates are unacceptable for Xpert Xpress SARS-CoV-2/FLU/RSV testing.  Fact Sheet for Patients: EntrepreneurPulse.com.au  Fact Sheet for Healthcare Providers: IncredibleEmployment.be  This test is not yet approved or cleared by the [place] FDA and has been authorized for detection and/or diagnosis of SARS-CoV-2 by FDA under an Emergency Use Authorization (EUA). This EUA will remain in effect (meaning this test can be used) for the duration of the COVID-19 declaration under Section 564(b)(1) of the Act, 21 U.S.C. section 360bbb-3(b)(1), unless the authorization is terminated or revoked.  Performed at [facility], [place] [address] [place], [place] [address]      Medications:    amLODipine  10 mg Oral Daily   amoxicillin  500 mg Oral TID   atorvastatin  40  mg Oral Daily   enoxaparin (LOVENOX) injection  0.5 mg/kg Subcutaneous Q24H   finasteride  5 mg Oral Daily   gabapentin  300 mg Oral BID   guaiFENesin  600 mg Oral BID   ipratropium-albuterol  3 mL Nebulization TID   metoprolol tartrate  25 mg Oral BID   predniSONE  40 mg Oral Q breakfast   sertraline  50 mg Oral Daily   sodium chloride flush  3 mL Intravenous Q12H   tamsulosin  0.4 mg Oral QPC supper   Continuous Infusions:  azithromycin 500 mg (04/07/22 1740)      LOS: 1 day   Charlynne Cousins  [facility]  04/08/2022, 9:01 AM

## 2022-04-09 DIAGNOSIS — I10 Essential (primary) hypertension: Secondary | ICD-10-CM | POA: Diagnosis not present

## 2022-04-09 DIAGNOSIS — J9601 Acute respiratory failure with hypoxia: Secondary | ICD-10-CM | POA: Diagnosis not present

## 2022-04-09 DIAGNOSIS — J441 Chronic obstructive pulmonary disease with (acute) exacerbation: Secondary | ICD-10-CM | POA: Diagnosis not present

## 2022-04-09 NOTE — Progress Notes (Signed)
[facility] PROGRESS NOTE    Progress Note  Kyle Lewis  FWY:[id number] DOB: 1945-09-13 DOA: 04/06/2022 PCP: [name], MD     Brief Narrative:   Kyle Lewis is an 76 y.o. male past medical history significant for essential hypertension, chronic diastolic heart failure, COPD AAA history of tobacco abuse obstructive sleep apnea comes in complaining of progressive shortness of breath and productive cough that started about 2 weeks prior to admission.  EMS in route noted him to be hypoxic was placed on 2 L started on Solu-Medrol and nebs   Assessment/Plan:   Acute respiratory failure with hypoxia secondary to COPD exacerbation: Chest x-ray showed bilateral opacities likely atelectasis. Started on IV Solu-Medrol steroids and antibiotics. Still with on 3 L this morning to keep saturations greater than 90%. He relates his breathing is better. Continues into spirometry out of bed to chair. Wean to room air. Sickle therapy evaluated the patient recommended home health PT.  Essential hypertension: Blood pressure was elevated on admission he was restarted on metoprolol and amlodipine, is improved this morning.  Chronic back pain: Continue narcotics for pain.  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 He was initially treated with penicillins and gentamicin. He is currently on amoxicillin with an undetermined stop date. Continue amoxicillin.  Chronic diastolic heart failure: Appears to be euvolemic.  BPH: Continue Proscar and Flomax, status post transurethral resection.  Hyperlipidemia: Continue statins.  History of tobacco abuse: He reports quitting about 2 years ago.  Obstructive sleep apnea: Continue BiPAP at night.  History of AAA repair: With TAVR's.  Obesity:  DVT prophylaxis: lovenox Family Communication:none Status is: Observation The patient will require care  spanning > 2 midnights and should be moved to inpatient because: Respiratory failure with hypoxia still requiring oxygen moving poor air    Code Status:     Code Status Orders  (From admission, onward)           Start     Ordered   04/06/22 1626  Full code  Continuous        04/06/22 1627           Code Status History     Date Active Date Inactive Code Status Order ID Comments User Context   11/30/2021 1621 12/16/2021 2145 Full Code [id number]  Clance Boll, MD ED   05/27/2021 2309 06/10/2021 1708 Full Code [id number]  Kristopher Oppenheim, DO Inpatient   05/27/2021 1931 05/27/2021 2309 Full Code [id number]  Kristopher Oppenheim, DO ED   01/24/2018 0834 01/24/2018 1420 Full Code [id number]  Nelva Bush, MD Inpatient   09/15/2016 0815 09/15/2016 1528 Full Code [id number]  Nelva Bush, MD Inpatient   04/01/2012 1432 04/04/2012 1518 Full Code [id number]  Cheek, Lester [place], RN Inpatient         IV Access:   Peripheral IV   Procedures and diagnostic studies:   No results found.   Medical Consultants:   None.   Subjective:    Kyle Lewis relates his breathing is unchanged compared to yesterday  Objective:    Vitals:   04/09/22 0426 04/09/22 0440 04/09/22 0734 04/09/22 0811  BP: (!) 151/72 (!) 149/78 125/72   Pulse:   66   Resp:   20   Temp:   98 F (36.7 C)   TempSrc:   Oral   SpO2:   90% 94%  Weight:      Height:  SpO2: 94 % O2 Flow Rate (L/min): 3 L/min   Intake/Output Summary (Last 24 hours) at 04/09/2022 0814 Last data filed at 04/08/2022 2200 Gross per 24 hour  Intake 3 ml  Output 375 ml  Net -372 ml    Filed Weights   04/06/22 1302  Weight: 98.9 kg    Exam: General exam: In no acute distress. Respiratory system: Good air movement and wheezing and rhonchi's bilaterally Cardiovascular system: S1 & S2 heard, RRR. No JVD. Gastrointestinal system: Abdomen is nondistended, soft and nontender.  Extremities: No pedal edema. Skin: No  rashes, lesions or ulcers Psychiatry: Judgement and insight appear normal. Mood & affect appropriate.   Data Reviewed:    Labs: Basic Metabolic Panel: Recent Labs  Lab 04/06/22 1311 04/07/22 0304  NA 142 139  K 4.0 4.6  CL 104 103  CO2 27 27  GLUCOSE 116* 123*  BUN 18 23  CREATININE 0.83 1.03  CALCIUM 9.0 8.8*    GFR Estimated Creatinine Clearance: 69.4 mL/min (by C-G formula based on SCr of 1.03 mg/dL). Liver Function Tests: No results for input(s): "AST", "ALT", "ALKPHOS", "BILITOT", "PROT", "ALBUMIN" in the last 168 hours. No results for input(s): "LIPASE", "AMYLASE" in the last 168 hours. No results for input(s): "AMMONIA" in the last 168 hours. Coagulation profile No results for input(s): "INR", "PROTIME" in the last 168 hours. COVID-19 Labs  No results for input(s): "DDIMER", "FERRITIN", "LDH", "CRP" in the last 72 hours.  Lab Results  Component Value Date   SARSCOV2NAA NEGATIVE 04/06/2022   [place] NEGATIVE 11/30/2021   [place] NEGATIVE 05/27/2021   SARSCOV2NAA NOT DETECTED 05/20/2021    CBC: Recent Labs  Lab 04/06/22 1311 04/07/22 0304  WBC 8.1 5.9  NEUTROABS 5.7  --   HGB 13.9 13.8  HCT 41.7 40.9  MCV 93.1 91.5  PLT 174 170    Cardiac Enzymes: No results for input(s): "CKTOTAL", "CKMB", "CKMBINDEX", "TROPONINI" in the last 168 hours. BNP (last 3 results) No results for input(s): "PROBNP" in the last 8760 hours. CBG: No results for input(s): "GLUCAP" in the last 168 hours. D-Dimer: No results for input(s): "DDIMER" in the last 72 hours. Hgb A1c: No results for input(s): "HGBA1C" in the last 72 hours. Lipid Profile: No results for input(s): "CHOL", "HDL", "LDLCALC", "TRIG", "CHOLHDL", "LDLDIRECT" in the last 72 hours. Thyroid function studies: No results for input(s): "TSH", "T4TOTAL", "T3FREE", "THYROIDAB" in the last 72 hours.  Invalid input(s): "FREET3" Anemia work up: No results for input(s): "VITAMINB12", "FOLATE",  "FERRITIN", "TIBC", "IRON", "RETICCTPCT" in the last 72 hours. Sepsis Labs: Recent Labs  Lab 04/06/22 1311 04/07/22 0304  WBC 8.1 5.9    Microbiology Recent Results (from the past 240 hour(s))  Resp Panel by RT-PCR (Flu A&B, Covid) Anterior Nasal Swab     Status: None   Collection Time: 04/06/22  1:11 PM   Specimen: Anterior Nasal Swab  Result Value Ref Range Status   SARS Coronavirus 2 by RT PCR NEGATIVE NEGATIVE Final    Comment: (NOTE) SARS-CoV-2 target nucleic acids are NOT DETECTED.  The SARS-CoV-2 RNA is generally detectable in upper respiratory specimens during the acute phase of infection. The lowest concentration of SARS-CoV-2 viral copies this assay can detect is 138 copies/mL. A negative result does not preclude SARS-Cov-2 infection and should not be used as the sole basis for treatment or other patient management decisions. A negative result may occur with  improper specimen collection/handling, submission of specimen other than nasopharyngeal swab, presence of viral mutation(s) within  the areas targeted by this assay, and inadequate number of viral copies(<138 copies/mL). A negative result must be combined with clinical observations, patient history, and epidemiological information. The expected result is Negative.  Fact Sheet for Patients:  EntrepreneurPulse.com.au  Fact Sheet for Healthcare Providers:  IncredibleEmployment.be  This test is no t yet approved or cleared by the [place] FDA and  has been authorized for detection and/or diagnosis of SARS-CoV-2 by FDA under an Emergency Use Authorization (EUA). This EUA will remain  in effect (meaning this test can be used) for the duration of the COVID-19 declaration under Section 564(b)(1) of the Act, 21 U.S.C.section 360bbb-3(b)(1), unless the authorization is terminated  or revoked sooner.       Influenza A by PCR NEGATIVE NEGATIVE Final   Influenza B by PCR  NEGATIVE NEGATIVE Final    Comment: (NOTE) The Xpert Xpress SARS-CoV-2/FLU/RSV plus assay is intended as an aid in the diagnosis of influenza from Nasopharyngeal swab specimens and should not be used as a sole basis for treatment. Nasal washings and aspirates are unacceptable for Xpert Xpress SARS-CoV-2/FLU/RSV testing.  Fact Sheet for Patients: EntrepreneurPulse.com.au  Fact Sheet for Healthcare Providers: IncredibleEmployment.be  This test is not yet approved or cleared by the [place] FDA and has been authorized for detection and/or diagnosis of SARS-CoV-2 by FDA under an Emergency Use Authorization (EUA). This EUA will remain in effect (meaning this test can be used) for the duration of the COVID-19 declaration under Section 564(b)(1) of the Act, 21 U.S.C. section 360bbb-3(b)(1), unless the authorization is terminated or revoked.  Performed at [facility], [place] [address] [place], [place] [address]      Medications:    amLODipine  10 mg Oral Daily   amoxicillin  500 mg Oral TID   atorvastatin  40 mg Oral Daily   enoxaparin (LOVENOX) injection  0.5 mg/kg Subcutaneous Q24H   finasteride  5 mg Oral Daily   gabapentin  300 mg Oral BID   guaiFENesin  600 mg Oral BID   ipratropium-albuterol  3 mL Nebulization TID   metoprolol tartrate  25 mg Oral BID   predniSONE  40 mg Oral Q breakfast   sertraline  50 mg Oral Daily   sodium chloride flush  3 mL Intravenous Q12H   tamsulosin  0.4 mg Oral QPC supper   Continuous Infusions:  azithromycin 500 mg (04/08/22 1656)      LOS: 2 days   Charlynne Cousins  [facility]  04/09/2022, 8:14 AM

## 2022-04-09 NOTE — Plan of Care (Signed)

## 2022-04-09 NOTE — Plan of Care (Signed)
  Problem: Health Behavior/Discharge Planning: Goal: Ability to manage health-related needs will improve Outcome: Progressing   Problem: Clinical Measurements: Goal: Will remain free from infection Outcome: Progressing   Problem: Activity: Goal: Risk for activity intolerance will decrease Outcome: Progressing   Problem: Coping: Goal: Level of anxiety will decrease Outcome: Progressing   Problem: Pain Managment: Goal: General experience of comfort will improve Outcome: Progressing

## 2022-04-10 DIAGNOSIS — J441 Chronic obstructive pulmonary disease with (acute) exacerbation: Secondary | ICD-10-CM | POA: Diagnosis not present

## 2022-04-10 DIAGNOSIS — J9601 Acute respiratory failure with hypoxia: Secondary | ICD-10-CM | POA: Diagnosis not present

## 2022-04-10 DIAGNOSIS — I10 Essential (primary) hypertension: Secondary | ICD-10-CM | POA: Diagnosis not present

## 2022-04-10 MED ORDER — PREDNISONE 20 MG PO TABS
50.0000 mg | ORAL_TABLET | Freq: Every day | ORAL | Status: DC
  Administered 2022-04-11 – 2022-04-12 (×2): 50 mg via ORAL
  Filled 2022-04-10 (×2): qty 1

## 2022-04-10 MED ORDER — UMECLIDINIUM BROMIDE 62.5 MCG/ACT IN AEPB
1.0000 | INHALATION_SPRAY | Freq: Every day | RESPIRATORY_TRACT | Status: DC
  Administered 2022-04-10 – 2022-04-12 (×3): 1 via RESPIRATORY_TRACT
  Filled 2022-04-10: qty 7

## 2022-04-10 MED ORDER — TIOTROPIUM BROMIDE MONOHYDRATE 18 MCG IN CAPS: 18.0000 ug | ORAL_CAPSULE | Freq: Every day | RESPIRATORY_TRACT | Status: DC

## 2022-04-10 MED ORDER — LISINOPRIL 5 MG PO TABS
5.0000 mg | ORAL_TABLET | Freq: Every day | ORAL | Status: DC
  Administered 2022-04-10 – 2022-04-12 (×3): 5 mg via ORAL
  Filled 2022-04-10 (×3): qty 1

## 2022-04-10 MED ORDER — ALBUTEROL SULFATE HFA 108 (90 BASE) MCG/ACT IN AERS
2.0000 | INHALATION_SPRAY | RESPIRATORY_TRACT | Status: DC
  Administered 2022-04-10 (×4): 2 via RESPIRATORY_TRACT
  Filled 2022-04-10: qty 6.7

## 2022-04-10 NOTE — Plan of Care (Signed)
0700: Pt resting bed at beginning of shift, pt alert and oriented x4, pt on room air, pt call bell within reach and bed in lowest position.  0900;RN assisted pt to bathroom, pt ambulated with walker, pt de-sated to 86% after walking and sitting on toilet pt taking awhile to recover, RN placed pt on 2L [place]

## 2022-04-10 NOTE — Plan of Care (Signed)

## 2022-04-10 NOTE — Progress Notes (Signed)
[facility] PROGRESS NOTE    Progress Note  Kyle Lewis  TOI:[id number] DOB: 1946-03-19 DOA: 04/06/2022 PCP: [name], MD     Brief Narrative:   Kyle Lewis is an 76 y.o. male past medical history significant for essential hypertension, chronic diastolic heart failure, COPD AAA history of tobacco abuse obstructive sleep apnea comes in complaining of progressive shortness of breath and productive cough that started about 2 weeks prior to admission.  EMS in route noted him to be hypoxic was placed on 2 L started on Solu-Medrol and nebs   Assessment/Plan:   Acute respiratory failure with hypoxia secondary to COPD exacerbation: Chest x-ray showed bilateral opacities likely atelectasis. Started on IV Solu-Medrol steroids and antibiotics. Still requiring 2 L of oxygen to keep saturations greater than 92%, he was weaned to room air was satting 83%. He relates his breathing is better. Continue signs of trauma treat, out of bed to chair, wean to room air. Physical therapy evaluated the patient, will go home with with home health PT. Ambulating check saturations with ambulation.  Essential hypertension: Blood pressure was elevated on admission he was restarted on metoprolol and amlodipine, is improved this morning. Start low-dose lisinopril we will need to check a basic metabolic panel in 1 week.  Chronic back pain: Continue narcotics for pain.  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 He was initially treated with penicillins and gentamicin. He is currently on amoxicillin with an undetermined stop date. Continue amoxicillin.  Chronic diastolic heart failure: Appears to be euvolemic.  BPH: Continue Proscar and Flomax, status post transurethral resection.  Hyperlipidemia: Continue statins.  History of tobacco abuse: He reports quitting about 2 years ago.  Obstructive sleep  apnea: Continue BiPAP at night.  History of AAA repair: With TAVR's.  Obesity:  DVT prophylaxis: lovenox Family Communication:none Status is: Observation The patient will require care spanning > 2 midnights and should be moved to inpatient because: Respiratory failure with hypoxia still requiring oxygen moving poor air    Code Status:     Code Status Orders  (From admission, onward)           Start     Ordered   04/06/22 1626  Full code  Continuous        04/06/22 1627           Code Status History     Date Active Date Inactive Code Status Order ID Comments User Context   11/30/2021 1621 12/16/2021 2145 Full Code [id number]  Clance Boll, MD ED   05/27/2021 2309 06/10/2021 1708 Full Code [id number]  Kristopher Oppenheim, DO Inpatient   05/27/2021 1931 05/27/2021 2309 Full Code [id number]  Kristopher Oppenheim, DO ED   01/24/2018 0834 01/24/2018 1420 Full Code [id number]  Nelva Bush, MD Inpatient   09/15/2016 0815 09/15/2016 1528 Full Code [id number]  Nelva Bush, MD Inpatient   04/01/2012 1432 04/04/2012 1518 Full Code [id number]  Cheek, Lester [place], RN Inpatient         IV Access:   Peripheral IV   Procedures and diagnostic studies:   No results found.   Medical Consultants:   None.   Subjective:    Kyle Lewis relates his breathing is unchanged compared yesterday he is very short of breath going to the bathroom  Objective:    Vitals:   04/09/22 2023 04/09/22 2211 04/09/22 2211 04/10/22 0738  BP:  99/70 99/70 (!) 143/61  Pulse:  74 72 65  Resp:   18 18  Temp:   98 F (36.7 C) 98.4 F (36.9 C)  TempSrc:   Oral   SpO2: 93%  95% (!) 83%  Weight:      Height:       SpO2: (!) 83 % O2 Flow Rate (L/min): 2 L/min   Intake/Output Summary (Last 24 hours) at 04/10/2022 0931 Last data filed at 04/10/2022 0916 Gross per 24 hour  Intake 606 ml  Output --  Net 606 ml    Filed Weights   04/06/22 1302  Weight: 98.9 kg    Exam: General exam:  In no acute distress. Respiratory system: Good air movement and wheezing bilateral Cardiovascular system: S1 & S2 heard, RRR. No JVD. Gastrointestinal system: Abdomen is nondistended, soft and nontender.  Extremities: No pedal edema. Skin: No rashes, lesions or ulcers Psychiatry: Judgement and insight appear normal. Mood & affect appropriate.   Data Reviewed:    Labs: Basic Metabolic Panel: Recent Labs  Lab 04/06/22 1311 04/07/22 0304  NA 142 139  K 4.0 4.6  CL 104 103  CO2 27 27  GLUCOSE 116* 123*  BUN 18 23  CREATININE 0.83 1.03  CALCIUM 9.0 8.8*    GFR Estimated Creatinine Clearance: 69.4 mL/min (by C-G formula based on SCr of 1.03 mg/dL). Liver Function Tests: No results for input(s): "AST", "ALT", "ALKPHOS", "BILITOT", "PROT", "ALBUMIN" in the last 168 hours. No results for input(s): "LIPASE", "AMYLASE" in the last 168 hours. No results for input(s): "AMMONIA" in the last 168 hours. Coagulation profile No results for input(s): "INR", "PROTIME" in the last 168 hours. COVID-19 Labs  No results for input(s): "DDIMER", "FERRITIN", "LDH", "CRP" in the last 72 hours.  Lab Results  Component Value Date   SARSCOV2NAA NEGATIVE 04/06/2022   [place] NEGATIVE 11/30/2021   [place] NEGATIVE 05/27/2021   SARSCOV2NAA NOT DETECTED 05/20/2021    CBC: Recent Labs  Lab 04/06/22 1311 04/07/22 0304  WBC 8.1 5.9  NEUTROABS 5.7  --   HGB 13.9 13.8  HCT 41.7 40.9  MCV 93.1 91.5  PLT 174 170    Cardiac Enzymes: No results for input(s): "CKTOTAL", "CKMB", "CKMBINDEX", "TROPONINI" in the last 168 hours. BNP (last 3 results) No results for input(s): "PROBNP" in the last 8760 hours. CBG: No results for input(s): "GLUCAP" in the last 168 hours. D-Dimer: No results for input(s): "DDIMER" in the last 72 hours. Hgb A1c: No results for input(s): "HGBA1C" in the last 72 hours. Lipid Profile: No results for input(s): "CHOL", "HDL", "LDLCALC", "TRIG", "CHOLHDL",  "LDLDIRECT" in the last 72 hours. Thyroid function studies: No results for input(s): "TSH", "T4TOTAL", "T3FREE", "THYROIDAB" in the last 72 hours.  Invalid input(s): "FREET3" Anemia work up: No results for input(s): "VITAMINB12", "FOLATE", "FERRITIN", "TIBC", "IRON", "RETICCTPCT" in the last 72 hours. Sepsis Labs: Recent Labs  Lab 04/06/22 1311 04/07/22 0304  WBC 8.1 5.9    Microbiology Recent Results (from the past 240 hour(s))  Resp Panel by RT-PCR (Flu A&B, Covid) Anterior Nasal Swab     Status: None   Collection Time: 04/06/22  1:11 PM   Specimen: Anterior Nasal Swab  Result Value Ref Range Status   SARS Coronavirus 2 by RT PCR NEGATIVE NEGATIVE Final    Comment: (NOTE) SARS-CoV-2 target nucleic acids are NOT DETECTED.  The SARS-CoV-2 RNA is generally detectable in upper respiratory specimens during the acute phase of infection. The lowest concentration of SARS-CoV-2 viral copies this assay can detect is  138 copies/mL. A negative result does not preclude SARS-Cov-2 infection and should not be used as the sole basis for treatment or other patient management decisions. A negative result may occur with  improper specimen collection/handling, submission of specimen other than nasopharyngeal swab, presence of viral mutation(s) within the areas targeted by this assay, and inadequate number of viral copies(<138 copies/mL). A negative result must be combined with clinical observations, patient history, and epidemiological information. The expected result is Negative.  Fact Sheet for Patients:  EntrepreneurPulse.com.au  Fact Sheet for Healthcare Providers:  IncredibleEmployment.be  This test is no t yet approved or cleared by the [place] FDA and  has been authorized for detection and/or diagnosis of SARS-CoV-2 by FDA under an Emergency Use Authorization (EUA). This EUA will remain  in effect (meaning this test can be used) for the  duration of the COVID-19 declaration under Section 564(b)(1) of the Act, 21 U.S.C.section 360bbb-3(b)(1), unless the authorization is terminated  or revoked sooner.       Influenza A by PCR NEGATIVE NEGATIVE Final   Influenza B by PCR NEGATIVE NEGATIVE Final    Comment: (NOTE) The Xpert Xpress SARS-CoV-2/FLU/RSV plus assay is intended as an aid in the diagnosis of influenza from Nasopharyngeal swab specimens and should not be used as a sole basis for treatment. Nasal washings and aspirates are unacceptable for Xpert Xpress SARS-CoV-2/FLU/RSV testing.  Fact Sheet for Patients: EntrepreneurPulse.com.au  Fact Sheet for Healthcare Providers: IncredibleEmployment.be  This test is not yet approved or cleared by the [place] FDA and has been authorized for detection and/or diagnosis of SARS-CoV-2 by FDA under an Emergency Use Authorization (EUA). This EUA will remain in effect (meaning this test can be used) for the duration of the COVID-19 declaration under Section 564(b)(1) of the Act, 21 U.S.C. section 360bbb-3(b)(1), unless the authorization is terminated or revoked.  Performed at [facility], [place] [address] [place], [place] [address]      Medications:    amLODipine  10 mg Oral Daily   amoxicillin  500 mg Oral TID   atorvastatin  40 mg Oral Daily   enoxaparin (LOVENOX) injection  0.5 mg/kg Subcutaneous Q24H   finasteride  5 mg Oral Daily   gabapentin  300 mg Oral BID   guaiFENesin  600 mg Oral BID   ipratropium-albuterol  3 mL Nebulization TID   metoprolol tartrate  25 mg Oral BID   predniSONE  40 mg Oral Q breakfast   sertraline  50 mg Oral Daily   sodium chloride flush  3 mL Intravenous Q12H   tamsulosin  0.4 mg Oral QPC supper   Continuous Infusions:  azithromycin 500 mg (04/09/22 1725)      LOS: 3 days   Charlynne Cousins  [facility]  04/10/2022, 9:31 AM

## 2022-04-10 NOTE — Progress Notes (Signed)
SATURATION QUALIFICATIONS: (This note is used to comply with regulatory documentation for home oxygen)  Patient Saturations on Room Air at Rest = 92%  Patient Saturations on Room Air while Ambulating = 90% briefly but mostly 92% and above (Sensor placed on ear for good waveform during gait as his finger was not reading well with noisy signal holding onto RW)   Please briefly explain why patient needs home oxygen: N/A

## 2022-04-10 NOTE — Progress Notes (Signed)
Physical Therapy Treatment Patient Details Name: Kyle Lewis MRN: [id number] DOB: 1946/03/24 Today's Date: 04/10/2022   History of Present Illness Pt is a 76 y.o. male admitted 11/2 for COPD exacerbation. PMH: HTN, COPD, OSA, CAD, AAA, chronic back pain, TVAR endocarditis, lumbar discitis with epidural abscess L5-S1.    PT Comments    Pt received in supine, agreeable to therapy session and with good participation and tolerance for transfer and gait training longer household distances this session. Pt with mild DOE 1-2/4 improves with seated break and SpO2 WFL on RA for gait trials with continuous pulse ox monitoring. Pt needing Supervision for safety cues with transfers and activity pacing. Pt with fair use of RW, has difficulty with upright posture due to hx of recent back sx. Reviewed seated LE exercises, gradual progression of activity within tolerance and importance of OOB to chair multiple times a day for improved pulmonary clearance along with IS. Pt continues to benefit from PT services to progress toward functional mobility goals.    Recommendations for follow up therapy are one component of a multi-disciplinary discharge planning process, led by the attending physician.  Recommendations may be updated based on patient status, additional functional criteria and insurance authorization.  Follow Up Recommendations  Home health PT     Assistance Recommended at Discharge PRN  Patient can return home with the following Help with stairs or ramp for entrance   Equipment Recommendations  None recommended by PT    Recommendations for Other Services       Precautions / Restrictions Precautions Precautions: Fall;Other (comment) Precaution Comments: watch sats Restrictions Weight Bearing Restrictions: No     Mobility  Bed Mobility Overal bed mobility: Needs Assistance Bed Mobility: Supine to Sit     Supine to sit: Supervision     General bed mobility comments: increased  time to perform; pt using bed features/rails and HOB elevated    Transfers Overall transfer level: Needs assistance Equipment used: Rolling walker (2 wheels) Transfers: Sit to/from Stand Sit to Stand: Supervision           General transfer comment: pt unlocked rollator after therapist locked it and placed it in front of him prior to standing; pt reminded to lock it prior to getting up and unlock it once standing; poor carryover of cues for use of brakes; EOB>Rollator and Rollator<> chair with and without armrests    Ambulation/Gait Ambulation/Gait assistance: Supervision Gait Distance (Feet): 150 Feet (x2 with seated break ~5 mins) Assistive device: Rollator (4 wheels) Gait Pattern/deviations: Step-through pattern, Trunk flexed, Decreased stride length Gait velocity: decreased Gait velocity interpretation: <1.8 ft/sec, indicate of risk for recurrent falls   General Gait Details: SpO2 90-96% on RA with exertional tasks and at rest, mostly 92% and above during ambulation. Sensor not reading well on his finger (not registering on monitor) and had to be placed on his right ear with mostly good waveform. Crouched posture during gait trial, does not improve with cues; seated break for recovery halfway through and DOE 2/4.   Stairs Stairs:  (pt has a ramp to enter home)           Wheelchair Mobility    Modified Rankin (Stroke Patients Only)       Balance Overall balance assessment: Mild deficits observed, not formally tested         Standing balance support: Reliant on assistive device for balance (can stand with U UE support but needs rollator for gait)  Cognition Arousal/Alertness: Awake/alert Behavior During Therapy: WFL for tasks assessed/performed Overall Cognitive Status: Within Functional Limits for tasks assessed                                 General Comments: needs safety cues (pt unlocks rollator  prior to standing)        Exercises Other Exercises Other Exercises: seated BLE AROM: LAQ and hip flexion x10 reps ea    General Comments        Pertinent Vitals/Pain Pain Assessment Pain Assessment: Faces Faces Pain Scale: Hurts little more Pain Location: low back pain (pt reports it is chronic and back surgery ~6 mos prior) Pain Descriptors / Indicators: Discomfort Pain Intervention(s): Monitored during session, Other (comment) (pt tending to flex trunk more during ambulation, offered to raise handles up one level on his rollator but he reports any higher exacerbates his back and R shoulder pain.)     PT Goals (current goals can now be found in the care plan section) Acute Rehab PT Goals Patient Stated Goal: home PT Goal Formulation: With patient Time For Goal Achievement: 04/21/22 Progress towards PT goals: Progressing toward goals    Frequency    Min 3X/week      PT Plan Current plan remains appropriate       AM-PAC PT "6 Clicks" Mobility   Outcome Measure  Help needed turning from your back to your side while in a flat bed without using bedrails?: None Help needed moving from lying on your back to sitting on the side of a flat bed without using bedrails?: A Little Help needed moving to and from a bed to a chair (including a wheelchair)?: A Little Help needed standing up from a chair using your arms (e.g., wheelchair or bedside chair)?: A Little Help needed to walk in hospital room?: A Little Help needed climbing 3-5 steps with a railing? : A Little 6 Click Score: 19    End of Session Equipment Utilized During Treatment: Gait belt Activity Tolerance: Patient tolerated treatment well Patient left: in chair;with call bell/phone within reach Nurse Communication: Mobility status;Other (comment) (SpO2 WFL on RA) PT Visit Diagnosis: Muscle weakness (generalized) (M62.81);Difficulty in walking, not elsewhere classified (R26.2)     Time: 1443-1540 PT Time  Calculation (min) (ACUTE ONLY): 32 min  Charges:  $Gait Training: 8-22 mins $Therapeutic Exercise: 8-22 mins                     Takeshi Teasdale P., PTA Acute Rehabilitation Services Secure Chat Preferred 9a-5:30pm Office: [place] 04/10/2022, 5:48 PM

## 2022-04-10 NOTE — Care Management Important Message (Signed)
Important Message  Patient Details  Name: Kyle Lewis MRN: [id number] Date of Birth: 1946/05/04   Medicare Important Message Given:  Yes     Hannah Beat 04/10/2022, 12:36 PM

## 2022-04-11 ENCOUNTER — Ambulatory Visit: Payer: Self-pay

## 2022-04-11 DIAGNOSIS — I10 Essential (primary) hypertension: Secondary | ICD-10-CM | POA: Diagnosis not present

## 2022-04-11 DIAGNOSIS — J441 Chronic obstructive pulmonary disease with (acute) exacerbation: Secondary | ICD-10-CM | POA: Diagnosis not present

## 2022-04-11 DIAGNOSIS — J9601 Acute respiratory failure with hypoxia: Secondary | ICD-10-CM | POA: Diagnosis not present

## 2022-04-11 MED ORDER — ALBUTEROL SULFATE HFA 108 (90 BASE) MCG/ACT IN AERS
2.0000 | INHALATION_SPRAY | Freq: Three times a day (TID) | RESPIRATORY_TRACT | Status: DC
  Administered 2022-04-11 – 2022-04-12 (×4): 2 via RESPIRATORY_TRACT
  Filled 2022-04-11: qty 6.7

## 2022-04-11 MED ORDER — PNEUMOCOCCAL 20-VAL CONJ VACC 0.5 ML IM SUSY
0.5000 mL | PREFILLED_SYRINGE | INTRAMUSCULAR | Status: AC
  Administered 2022-04-12: 0.5 mL via INTRAMUSCULAR
  Filled 2022-04-11: qty 0.5

## 2022-04-11 NOTE — Progress Notes (Signed)
[facility] PROGRESS NOTE    Progress Note  Kyle Lewis  TDH:[id number] DOB: May 20, 1946 DOA: 04/06/2022 PCP: [name], MD     Brief Narrative:   Kyle Lewis is an 76 y.o. male past medical history significant for essential hypertension, chronic diastolic heart failure, COPD AAA history of tobacco abuse obstructive sleep apnea comes in complaining of progressive shortness of breath and productive cough that started about 2 weeks prior to admission.  EMS in route noted him to be hypoxic was placed on 2 L started on Solu-Medrol and nebs   Assessment/Plan:   Acute respiratory failure with hypoxia secondary to COPD exacerbation: Chest x-ray showed bilateral opacities likely atelectasis. Transition to oral steroids and antibiotics. Breathing sounds sound worse today he relates his breathing is now better, he denies episodes of cough with eating. Probably need to go home on oxygen. Use flutter valve, out of bed to chair, wean to room air. Physical therapy evaluated the patient, will go home with with home health PT.  Essential hypertension: Blood pressure was elevated on admission he was restarted on metoprolol and amlodipine, is improved this morning. Start low-dose lisinopril we will need to check a basic metabolic panel in 1 week.  Chronic back pain: Continue narcotics for pain.  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 He was initially treated with penicillins and gentamicin. He is currently on amoxicillin with an undetermined stop date. Continue amoxicillin.  Chronic diastolic heart failure: Appears to be euvolemic.  BPH: Continue Proscar and Flomax, status post transurethral resection.  Hyperlipidemia: Continue statins.  History of tobacco abuse: He reports quitting about 2 years ago.  Obstructive sleep apnea: Continue BiPAP at night.  History of AAA repair: With  TAVR's.  Obesity:  DVT prophylaxis: lovenox Family Communication:none Status is: Observation The patient will require care spanning > 2 midnights and should be moved to inpatient because: Respiratory failure with hypoxia still requiring oxygen moving poor air    Code Status:     Code Status Orders  (From admission, onward)           Start     Ordered   04/06/22 1626  Full code  Continuous        04/06/22 1627           Code Status History     Date Active Date Inactive Code Status Order ID Comments User Context   11/30/2021 1621 12/16/2021 2145 Full Code [id number]  Clance Boll, MD ED   05/27/2021 2309 06/10/2021 1708 Full Code [id number]  Kristopher Oppenheim, DO Inpatient   05/27/2021 1931 05/27/2021 2309 Full Code [id number]  Kristopher Oppenheim, DO ED   01/24/2018 0834 01/24/2018 1420 Full Code [id number]  Nelva Bush, MD Inpatient   09/15/2016 0815 09/15/2016 1528 Full Code [id number]  Nelva Bush, MD Inpatient   04/01/2012 1432 04/04/2012 1518 Full Code [id number]  Cheek, Lester Axtell, RN Inpatient         IV Access:   Peripheral IV   Procedures and diagnostic studies:   No results found.   Medical Consultants:   None.   Subjective:    Kyle Lewis relates his breathing is not better than yesterday.  Objective:    Vitals:   04/10/22 1700 04/10/22 1942 04/10/22 1954 04/10/22 2328  BP:   134/85   Pulse:   (!) 55 (!) 56  Resp:   17 18  Temp:   97.9 F (  36.6 C)   TempSrc:   Oral   SpO2: 92% 94% 94% 99%  Weight:      Height:       SpO2: 99 % O2 Flow Rate (L/min): 2 L/min   Intake/Output Summary (Last 24 hours) at 04/11/2022 0759 Last data filed at 04/10/2022 0916 Gross per 24 hour  Intake 123 ml  Output --  Net 123 ml    Filed Weights   04/06/22 1302  Weight: 98.9 kg    Exam: General exam: In no acute distress. Respiratory system: Moderate air movement with wheezing bilaterally Cardiovascular system: S1 & S2 heard, RRR. No  JVD. Gastrointestinal system: Abdomen is nondistended, soft and nontender.  Extremities: No pedal edema. Skin: No rashes, lesions or ulcers Psychiatry: Judgement and insight appear normal. Mood & affect appropriate.   Data Reviewed:    Labs: Basic Metabolic Panel: Recent Labs  Lab 04/06/22 1311 04/07/22 0304  NA 142 139  K 4.0 4.6  CL 104 103  CO2 27 27  GLUCOSE 116* 123*  BUN 18 23  CREATININE 0.83 1.03  CALCIUM 9.0 8.8*    GFR Estimated Creatinine Clearance: 69.4 mL/min (by C-G formula based on SCr of 1.03 mg/dL). Liver Function Tests: No results for input(s): "AST", "ALT", "ALKPHOS", "BILITOT", "PROT", "ALBUMIN" in the last 168 hours. No results for input(s): "LIPASE", "AMYLASE" in the last 168 hours. No results for input(s): "AMMONIA" in the last 168 hours. Coagulation profile No results for input(s): "INR", "PROTIME" in the last 168 hours. COVID-19 Labs  No results for input(s): "DDIMER", "FERRITIN", "LDH", "CRP" in the last 72 hours.  Lab Results  Component Value Date   SARSCOV2NAA NEGATIVE 04/06/2022   [place] NEGATIVE 11/30/2021   [place] NEGATIVE 05/27/2021   SARSCOV2NAA NOT DETECTED 05/20/2021    CBC: Recent Labs  Lab 04/06/22 1311 04/07/22 0304  WBC 8.1 5.9  NEUTROABS 5.7  --   HGB 13.9 13.8  HCT 41.7 40.9  MCV 93.1 91.5  PLT 174 170    Cardiac Enzymes: No results for input(s): "CKTOTAL", "CKMB", "CKMBINDEX", "TROPONINI" in the last 168 hours. BNP (last 3 results) No results for input(s): "PROBNP" in the last 8760 hours. CBG: No results for input(s): "GLUCAP" in the last 168 hours. D-Dimer: No results for input(s): "DDIMER" in the last 72 hours. Hgb A1c: No results for input(s): "HGBA1C" in the last 72 hours. Lipid Profile: No results for input(s): "CHOL", "HDL", "LDLCALC", "TRIG", "CHOLHDL", "LDLDIRECT" in the last 72 hours. Thyroid function studies: No results for input(s): "TSH", "T4TOTAL", "T3FREE", "THYROIDAB" in the last  72 hours.  Invalid input(s): "FREET3" Anemia work up: No results for input(s): "VITAMINB12", "FOLATE", "FERRITIN", "TIBC", "IRON", "RETICCTPCT" in the last 72 hours. Sepsis Labs: Recent Labs  Lab 04/06/22 1311 04/07/22 0304  WBC 8.1 5.9    Microbiology Recent Results (from the past 240 hour(s))  Resp Panel by RT-PCR (Flu A&B, Covid) Anterior Nasal Swab     Status: None   Collection Time: 04/06/22  1:11 PM   Specimen: Anterior Nasal Swab  Result Value Ref Range Status   SARS Coronavirus 2 by RT PCR NEGATIVE NEGATIVE Final    Comment: (NOTE) SARS-CoV-2 target nucleic acids are NOT DETECTED.  The SARS-CoV-2 RNA is generally detectable in upper respiratory specimens during the acute phase of infection. The lowest concentration of SARS-CoV-2 viral copies this assay can detect is 138 copies/mL. A negative result does not preclude SARS-Cov-2 infection and should not be used as the sole basis for treatment or  other patient management decisions. A negative result may occur with  improper specimen collection/handling, submission of specimen other than nasopharyngeal swab, presence of viral mutation(s) within the areas targeted by this assay, and inadequate number of viral copies(<138 copies/mL). A negative result must be combined with clinical observations, patient history, and epidemiological information. The expected result is Negative.  Fact Sheet for Patients:  EntrepreneurPulse.com.au  Fact Sheet for Healthcare Providers:  IncredibleEmployment.be  This test is no t yet approved or cleared by the [place] FDA and  has been authorized for detection and/or diagnosis of SARS-CoV-2 by FDA under an Emergency Use Authorization (EUA). This EUA will remain  in effect (meaning this test can be used) for the duration of the COVID-19 declaration under Section 564(b)(1) of the Act, 21 U.S.C.section 360bbb-3(b)(1), unless the authorization is  terminated  or revoked sooner.       Influenza A by PCR NEGATIVE NEGATIVE Final   Influenza B by PCR NEGATIVE NEGATIVE Final    Comment: (NOTE) The Xpert Xpress SARS-CoV-2/FLU/RSV plus assay is intended as an aid in the diagnosis of influenza from Nasopharyngeal swab specimens and should not be used as a sole basis for treatment. Nasal washings and aspirates are unacceptable for Xpert Xpress SARS-CoV-2/FLU/RSV testing.  Fact Sheet for Patients: EntrepreneurPulse.com.au  Fact Sheet for Healthcare Providers: IncredibleEmployment.be  This test is not yet approved or cleared by the [place] FDA and has been authorized for detection and/or diagnosis of SARS-CoV-2 by FDA under an Emergency Use Authorization (EUA). This EUA will remain in effect (meaning this test can be used) for the duration of the COVID-19 declaration under Section 564(b)(1) of the Act, 21 U.S.C. section 360bbb-3(b)(1), unless the authorization is terminated or revoked.  Performed at [facility], [place] [address], [place], [place] [address]      Medications:    albuterol  2 puff Inhalation TID   amLODipine  10 mg Oral Daily   amoxicillin  500 mg Oral TID   atorvastatin  40 mg Oral Daily   enoxaparin (LOVENOX) injection  0.5 mg/kg Subcutaneous Q24H   finasteride  5 mg Oral Daily   gabapentin  300 mg Oral BID   guaiFENesin  600 mg Oral BID   lisinopril  5 mg Oral Daily   metoprolol tartrate  25 mg Oral BID   predniSONE  50 mg Oral Q breakfast   sertraline  50 mg Oral Daily   sodium chloride flush  3 mL Intravenous Q12H   tamsulosin  0.4 mg Oral QPC supper   umeclidinium bromide  1 puff Inhalation Daily   Continuous Infusions:      LOS: 4 days   Charlynne Cousins  [facility]  04/11/2022, 7:59 AM

## 2022-04-11 NOTE — Plan of Care (Signed)
  Problem: Education: Goal: Knowledge of General Education information will improve Description: Including pain rating scale, medication(s)/side effects and non-pharmacologic comfort measures Outcome: Progressing   Problem: Health Behavior/Discharge Planning: Goal: Ability to manage health-related needs will improve Outcome: Progressing   Problem: Clinical Measurements: Goal: Will remain free from infection Outcome: Progressing   Problem: Clinical Measurements: Goal: Respiratory complications will improve Outcome: Progressing   

## 2022-04-11 NOTE — Progress Notes (Signed)
Patient placed on CPAP at this time.  

## 2022-04-11 NOTE — Consult Note (Signed)
   [facility] Inpatient Consult   04/11/2022  Kyle Lewis 02/04/1946 [id number]  [employer] [ACO] Patient: Medicare ACO REACH  Primary Care Provider:  [name], MD, notes reveals patient has been accepted by [facility] for follow up and are planning a home visit.  Patient is currently active with [facility] for chronic disease management services.  Patient has been engaged by a [facility] RNCM and [facility] Education officer, museum.     Plan: Continue to follow for post hospital community care coordination needs.  Of note, [facility] Care Management services does not replace or interfere with any services that are needed or arranged by inpatient [facility] care management team.   For additional questions or referrals please contact:  Natividad Brood, RN BSN [place]  [phone] business mobile phone Toll free office [phone]  *[place]  [phone] Fax number: [phone] [email or web address]

## 2022-04-11 NOTE — Progress Notes (Signed)
Patient Saturations on Room Air at Rest = 97%  Patient Saturations on [employer] while Ambulating = 90%  Patient walked a lap in the hallway. Minimal shortness of breath with exertion. Recovered quickly once seated.

## 2022-04-11 NOTE — Patient Outreach (Signed)
  Care Coordination   Follow Up Visit Note   04/11/2022 Name: LARNELL GRANLUND MRN: [id number] DOB: 1946/03/16  ZEDEKIAH HINDERMAN is a 76 y.o. year old male who sees [name], MD for primary care. I spoke with wife Elis Sauber by phone today.  What matters to the patients health and wellness today?  Patient is currently in the hospital for a COPD exacerbation.     Goals Addressed             This Visit's Progress    He is spitting up clear sputum and his lungs do not sound good       Care Coordination Interventions: Evaluation of current treatment plan related to upper respiratory infection and patient's adherence to plan as established by provider Spoke with wife [name] regarding patient's inpatient admission to [facility] for COPD exacerbation Determined his planned discharge to home is tomorrow, 04/12/22 Determined [facility] was scheduled to visit the patient tomorrow to assess for primary care needs, wife [name] called this provider to reschedule patient's initial home visit Sent secure email to Reche Dixon RN advising of patient's current status Sent secure message to [place] with update of patient's current status         SDOH assessments and interventions completed:  No     Care Coordination Interventions Activated:  Yes  Care Coordination Interventions:  Yes, provided   Follow up plan: Follow up call scheduled for 04/18/22 '@1'$ :00 PM    Encounter Outcome:  Pt. Visit Completed

## 2022-04-11 NOTE — Patient Instructions (Signed)
Visit Information  Thank you for taking time to visit with me today. Please don't hesitate to contact me if I can be of assistance to you.   Following are the goals we discussed today:   Goals Addressed             This Visit's Progress    He is spitting up clear sputum and his lungs do not sound good       Care Coordination Interventions: Evaluation of current treatment plan related to upper respiratory infection and patient's adherence to plan as established by provider Spoke with wife [name] regarding patient's inpatient admission to [facility] for COPD exacerbation Determined his planned discharge to home is tomorrow, 04/12/22 Determined [facility] was scheduled to visit the patient tomorrow to assess for primary care needs, wife [name] called this provider to reschedule patient's initial home visit Sent secure email to Reche Dixon RN advising of patient's current status Sent secure message to [place] with update of patient's current status         Our next appointment is by telephone on 04/18/22 at 1:00 PM   Please call the care guide team at [phone] if you need to cancel or reschedule your appointment.   If you are experiencing a Mental Health or [place] or need someone to talk to, please call [phone]-TALK (toll free, 24 hour hotline)  Patient verbalizes understanding of instructions and care plan provided today and agrees to view in [place]. Active MyChart status and patient understanding of how to access instructions and care plan via MyChart confirmed with patient.     Barb Merino, RN, BSN, CCM Care Management Coordinator [facility] Care Management Direct Phone: [phone]

## 2022-04-12 ENCOUNTER — Other Ambulatory Visit (HOSPITAL_COMMUNITY): Payer: Self-pay

## 2022-04-12 DIAGNOSIS — I5032 Chronic diastolic (congestive) heart failure: Secondary | ICD-10-CM | POA: Diagnosis not present

## 2022-04-12 DIAGNOSIS — I10 Essential (primary) hypertension: Secondary | ICD-10-CM | POA: Diagnosis not present

## 2022-04-12 DIAGNOSIS — J441 Chronic obstructive pulmonary disease with (acute) exacerbation: Secondary | ICD-10-CM | POA: Diagnosis not present

## 2022-04-12 DIAGNOSIS — J9601 Acute respiratory failure with hypoxia: Secondary | ICD-10-CM | POA: Diagnosis not present

## 2022-04-12 MED ORDER — PREDNISONE 10 MG PO TABS
ORAL_TABLET | ORAL | 0 refills | Status: DC
  Filled 2022-04-12: qty 15, 5d supply, fill #0

## 2022-04-12 MED ORDER — LISINOPRIL 5 MG PO TABS
10.0000 mg | ORAL_TABLET | Freq: Every day | ORAL | 3 refills | Status: DC
  Filled 2022-04-12: qty 30, 15d supply, fill #0

## 2022-04-12 NOTE — Progress Notes (Signed)
Nursing dc note  Patient alert and oriented , verbalized understanding of dc instructions. Toc meds and all belongings given to patient. Patient denies cp or sob.

## 2022-04-12 NOTE — Plan of Care (Signed)
  Problem: Health Behavior/Discharge Planning: Goal: Ability to manage health-related needs will improve Outcome: Progressing   Problem: Clinical Measurements: Goal: Diagnostic test results will improve Outcome: Progressing   Problem: Clinical Measurements: Goal: Respiratory complications will improve Outcome: Progressing   Problem: Activity: Goal: Risk for activity intolerance will decrease Outcome: Progressing   Problem: Coping: Goal: Level of anxiety will decrease Outcome: Progressing   Problem: Pain Managment: Goal: General experience of comfort will improve Outcome: Progressing

## 2022-04-12 NOTE — Discharge Summary (Signed)
Physician Discharge Summary  Kyle Lewis BZJ:[id number] DOB: 1945-08-25 DOA: 04/06/2022  PCP: [name], MD  Admit date: 04/06/2022 Discharge date: 04/12/2022  Admitted From: Home Disposition:  Home  Recommendations for Outpatient Follow-up:  Follow up with PCP in 1-2 weeks Please obtain BMP/CBC in one week   Home Health:No Equipment/Devices:None  Discharge Condition:Stable CODE STATUS:Full Diet recommendation: Heart Healthy  Brief/Interim Summary: 76 y.o. male past medical history significant for essential hypertension, chronic diastolic heart failure, COPD AAA history of tobacco abuse obstructive sleep apnea comes in complaining of progressive shortness of breath and productive cough that started about 2 weeks prior to admission.  EMS in route noted him to be hypoxic was placed on 2 L started on Solu-Medrol and neb   Discharge Diagnoses:  Principal Problem:   COPD exacerbation ([place]) Active Problems:   Acute respiratory failure with hypoxia (HCC)   Hypertension   Chronic back pain   Epidural abscess   History of bacteremia   Chronic diastolic CHF (congestive heart failure) (HCC)   BPH with obstruction/lower urinary tract symptoms   Hyperlipidemia LDL goal <70   History of tobacco abuse   OSA (obstructive sleep apnea)   Body mass index (BMI) 34.0-34.9, adult   AAA (abdominal aortic aneurysm) (HCC)   Acute respiratory failure with hypoxemia (HCC)  Acute respiratory failure with hypoxia probably secondary to COPD exacerbation: Chest x-ray showed bilateral opacities likely atelectasis. He was started on IV steroids and IV antibiotics along with oxygen and inhalers. Initially he was short of breath even sitting up but he slowly improved. He completed his course of antibiotics in house he will go home on a long steroid taper and oxygen. Physical therapy evaluated the patient recommended home health PT.  Essential hypertension: He was restarted on amlodipine and  metoprolol but his blood pressure continues to be elevated he was started on low-dose lisinopril will he will follow-up with his PCP in 1 week and should be made.  Chronic back pain: Continue narcotics for pain no change made to his medication.  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 No changes were made.  Chronic diastolic heart failure: Appears euvolemic continue current regimen.  BPH: Continue Proscar and Flomax.  Hyperlipidemia: Continue statins.  History of tobacco abuse: Quit about 2 years ago.  Obstructive sleep apnea: Continue BiPAP at night.    History of AAA repair/with TAVR's: Noted.  Obesity: Noted.  Discharge Instructions  Discharge Instructions     Diet - low sodium heart healthy   Complete by: As directed    Increase activity slowly   Complete by: As directed       Allergies as of 04/12/2022       Reactions   Cefazolin Rash, Other (See Comments)   The patient had surgery and was given cefazolin intraop. ~ 10 days later he developed a rash confirmed by biopsy to be consistent w/ drug eruption. We cannot know for sure, but this is the most likely agent.         Medication List     TAKE these medications    acetaminophen 500 MG tablet Commonly known as: TYLENOL Take 1,000 mg by mouth every 6 (six) hours as needed for moderate pain.   amLODipine 10 MG tablet Commonly known as: NORVASC Take 1 tablet (10 mg total) by mouth daily.   amoxicillin 500 MG capsule Commonly known as: AMOXIL Take 1 capsule (500  mg total) by mouth 3 (three) times daily.   atorvastatin 40 MG tablet Commonly known as: LIPITOR Take 1 tablet (40 mg total) by mouth daily.   finasteride 5 MG tablet Commonly known as: PROSCAR Take 1 tablet (5 mg total) by mouth daily.   gabapentin 300 MG capsule Commonly known as: NEURONTIN Take 1 capsule (300 mg total) by mouth 2 (two)  times daily.   lisinopril 5 MG tablet Commonly known as: ZESTRIL Take 2 tablets (10 mg total) by mouth daily.   metoprolol tartrate 25 MG tablet Commonly known as: LOPRESSOR Take 1 tablet (25 mg total) by mouth 2 (two) times daily.   oxyCODONE 5 MG immediate release tablet Commonly known as: Oxy IR/ROXICODONE Take 1 tablet (5 mg total) by mouth every 6 (six) hours as needed for severe pain.   predniSONE 10 MG tablet Commonly known as: DELTASONE Takes 5 tablets for 1 days, then 4 tablets for 1 days, then 3 tablets for 1 days, then 2 tabs for 1 days, then 1 tab for 1 days, and then stop.   senna-docusate 8.6-50 MG tablet Commonly known as: Senokot-S Take 1 tablet by mouth 2 (two) times daily.   sertraline 50 MG tablet Commonly known as: Zoloft Take 1 tablet (50 mg total) by mouth daily.   tamsulosin 0.4 MG Caps capsule Commonly known as: FLOMAX TAKE 1 CAPSULE BY MOUTH ONCE DAILY AFTER SUPPER What changed: See the new instructions.               Durable Medical Equipment  (From admission, onward)           Start     Ordered   04/12/22 0715  For home use only DME oxygen  Once       Question Answer Comment  Length of Need 6 Months   Mode or (Route) Nasal cannula   Liters per Minute 2   Frequency Continuous (stationary and portable oxygen unit needed)   Oxygen delivery system Gas      04/12/22 0714            Follow-up Information     [facility].   Specialty: Home Health Services Why: Someone will call you to schedule first home visit. Contact information: [address] [place] [address] [phone]                Allergies  Allergen Reactions   Cefazolin Rash and Other (See Comments)    The patient had surgery and was given cefazolin intraop. ~ 10 days later he developed a rash confirmed by biopsy to be consistent w/ drug eruption. We cannot know for sure, but this is the most likely agent.       Consultations: None   Procedures/Studies: DG Chest 2 View  Result Date: 04/06/2022 CLINICAL DATA:  Shortness of breath.  History of COPD. EXAM: CHEST - 2 VIEW COMPARISON:  Chest radiographs 03/23/2022 FINDINGS: Sequelae of transcatheter aortic valve replacement are again identified. The cardiomediastinal silhouette is unchanged with normal heart size. Aortic atherosclerosis is noted. There are mild opacities in both lung bases with those on the left being less than on the prior study. No pleural effusion or pneumothorax is identified. Severe degenerative changes are noted at the shoulders. IMPRESSION: Mild bibasilar opacities, improved on the left compared to the prior study and favored to reflect atelectasis Electronically Signed   By: Logan Bores M.D.   On: 04/06/2022 13:50   DG Chest  2 View  Result Date: 03/23/2022 CLINICAL DATA:  Shortness of breath EXAM: CHEST - 2 VIEW COMPARISON:  Chest x-ray 11/30/2021., CT abdomen pelvis 01/23/2022 FINDINGS: The heart and mediastinal contours are within normal limits. Atherosclerotic plaque. Aortic valve replacement. Question developing airspace opacity within left lower lung zone. No pulmonary edema. No pleural effusion. No pneumothorax. No acute osseous abnormality. Severe bilateral shoulder degenerative changes. IMPRESSION: 1. Question developing airspace opacity within left lower lung zone. Followup PA and lateral chest X-ray is recommended in 3-4 weeks following therapy to ensure resolution and exclude underlying malignancy. 2.  Aortic Atherosclerosis (ICD10-I70.0). Electronically Signed   By: Iven Finn M.D.   On: 03/23/2022 16:25   MR Lumbar Spine W Wo Contrast  Result Date: 03/14/2022 CLINICAL DATA:  Discitis of lumbosacral region M46.47 (ICD-10-CM). EXAM: MRI LUMBAR SPINE WITHOUT AND WITH CONTRAST TECHNIQUE: Multiplanar and multiecho pulse sequences of the lumbar spine were obtained without and with intravenous contrast. CONTRAST:  37m  MULTIHANCE GADOBENATE DIMEGLUMINE 529 MG/ML IV SOLN COMPARISON:  MRI of the lumbar spine January 30, 2022. FINDINGS: Prominent susceptibility artifact from aortic graft partially degraded images at the level of the lumbar vertebral bodies. Susceptibility artifact from posterior spinal fusion partially degrade images at the level of the posterior elements. Segmentation:  Standard. Alignment: Stepwise small retrolisthesis from L1-2 through L3-4. Grade 1 anterolisthesis of L4 over L5. Vertebrae: Postsurgical changes from posterior decompression and instrumented fusion from L2 through L5 with interbody fusion at L4-5. There is no significant interval change of the L5-S1 subtle disc edema and contrast enhancement with associated L5 and S1 marrow edema and erosion of the L5 inferior endplate, consistent with sequela of discitis/osteomyelitis. Persistent mild prevertebral/presacral soft tissue edema. Interval decrease in size of the small nonenhancing structure in the left subarticular zone measuring up to 4 mm in the current study (compared to 6 mm on prior) which could represent residual abscess/phlegmon no new fluid collections identified. Conus medullaris and cauda equina: Conus extends to the L1 level. Conus and cauda equina appear normal. Paraspinal and other soft tissues: Status post aortic aneurysm repair with endoprosthesis. Postsurgical changes in the posterior paraspinal soft tissues. A 1.5 cm right renal cyst (Bosniak I). No follow-up imaging is recommended. [employer] 2021; [id number] Bosniak Classification of Cystic Renal Masses, Version 2019. Disc levels: T12-L1: No spinal canal or neural foraminal stenosis. L1-2: Disc bulge with osteophytic component and facet degenerative changes without significant spinal canal or neural foraminal stenosis. L2-3: Endplate osteophytic ridging. No significant spinal canal or neural foraminal stenosis. L3-4: No spinal canal stenosis. Neural foramina are partially obscured by  susceptibility artifact but appear sufficiently patent. L4-5: No spinal canal stenosis. Neural foramina are partially obscured by susceptibility artifact but appear sufficiently patent. L5-S1 disc bulge, prominent facet degenerative changes with bilateral joint effusion and ligamentum flavum redundancy result in moderate subarticular zone stenosis and bilateral neural foraminal stenosis. IMPRESSION: 1. 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 2. Degenerative changes at L5-S1 resulting in moderate subarticular zone stenosis and bilateral neural foraminal stenosis. Electronically Signed   By: KPedro EarlsM.D.   On: 03/14/2022 16:11   (Echo, Carotid, EGD, Colonoscopy, ERCP)    Subjective: No complaints  Discharge Exam: Vitals:   04/11/22 2309 04/11/22 2333  BP: (!) 142/73  Pulse: 62 62  Resp: 18 18  Temp: 98.5 F (36.9 C)   SpO2: 95% 97%   Vitals:   04/11/22 2041 04/11/22 2307 04/11/22 2309 04/11/22 2333  BP:  (!) 142/73 (!) 142/73   Pulse:  (!) 57 62 62  Resp:   18 18  Temp:   98.5 F (36.9 C)   TempSrc:   Oral   SpO2: 96%  95% 97%  Weight:      Height:        General: Pt is alert, awake, not in acute distress Cardiovascular: RRR, S1/S2 +, no rubs, no gallops Respiratory: CTA bilaterally, no wheezing, no rhonchi Abdominal: Soft, NT, ND, bowel sounds + Extremities: no edema, no cyanosis    The results of significant diagnostics from this hospitalization (including imaging, microbiology, ancillary and laboratory) are listed below for reference.     Microbiology: Recent Results (from the past 240 hour(s))  Resp Panel by RT-PCR (Flu A&B, Covid) Anterior Nasal Swab     Status: None   Collection Time: 04/06/22  1:11 PM    Specimen: Anterior Nasal Swab  Result Value Ref Range Status   SARS Coronavirus 2 by RT PCR NEGATIVE NEGATIVE Final    Comment: (NOTE) SARS-CoV-2 target nucleic acids are NOT DETECTED.  The SARS-CoV-2 RNA is generally detectable in upper respiratory specimens during the acute phase of infection. The lowest concentration of SARS-CoV-2 viral copies this assay can detect is 138 copies/mL. A negative result does not preclude SARS-Cov-2 infection and should not be used as the sole basis for treatment or other patient management decisions. A negative result may occur with  improper specimen collection/handling, submission of specimen other than nasopharyngeal swab, presence of viral mutation(s) within the areas targeted by this assay, and inadequate number of viral copies(<138 copies/mL). A negative result must be combined with clinical observations, patient history, and epidemiological information. The expected result is Negative.  Fact Sheet for Patients:  EntrepreneurPulse.com.au  Fact Sheet for Healthcare Providers:  IncredibleEmployment.be  This test is no t yet approved or cleared by the [place] FDA and  has been authorized for detection and/or diagnosis of SARS-CoV-2 by FDA under an Emergency Use Authorization (EUA). This EUA will remain  in effect (meaning this test can be used) for the duration of the COVID-19 declaration under Section 564(b)(1) of the Act, 21 U.S.C.section 360bbb-3(b)(1), unless the authorization is terminated  or revoked sooner.       Influenza A by PCR NEGATIVE NEGATIVE Final   Influenza B by PCR NEGATIVE NEGATIVE Final    Comment: (NOTE) The Xpert Xpress SARS-CoV-2/FLU/RSV plus assay is intended as an aid in the diagnosis of influenza from Nasopharyngeal swab specimens and should not be used as a sole basis for treatment. Nasal washings and aspirates are unacceptable for Xpert Xpress  SARS-CoV-2/FLU/RSV testing.  Fact Sheet for Patients: EntrepreneurPulse.com.au  Fact Sheet for Healthcare Providers: IncredibleEmployment.be  This test is not yet approved or cleared by the [place] FDA and has been authorized for detection and/or diagnosis of SARS-CoV-2 by FDA under an Emergency Use Authorization (EUA). This EUA will remain in effect (meaning this test can be used) for the duration of the COVID-19 declaration under Section 564(b)(1) of the Act, 21 U.S.C. section 360bbb-3(b)(1), unless the authorization is terminated or revoked.  Performed at [facility], [place] [address] [place], [place] [address]      Labs: BNP (last 3 results) Recent Labs    11/30/21 1000 03/23/22 1420 04/06/22  1311  BNP 175.1* 59.3 05.3   Basic Metabolic Panel: Recent Labs  Lab 04/06/22 1311 04/07/22 0304  NA 142 139  K 4.0 4.6  CL 104 103  CO2 27 27  GLUCOSE 116* 123*  BUN 18 23  CREATININE 0.83 1.03  CALCIUM 9.0 8.8*   Liver Function Tests: No results for input(s): "AST", "ALT", "ALKPHOS", "BILITOT", "PROT", "ALBUMIN" in the last 168 hours. No results for input(s): "LIPASE", "AMYLASE" in the last 168 hours. No results for input(s): "AMMONIA" in the last 168 hours. CBC: Recent Labs  Lab 04/06/22 1311 04/07/22 0304  WBC 8.1 5.9  NEUTROABS 5.7  --   HGB 13.9 13.8  HCT 41.7 40.9  MCV 93.1 91.5  PLT 174 170   Cardiac Enzymes: No results for input(s): "CKTOTAL", "CKMB", "CKMBINDEX", "TROPONINI" in the last 168 hours. BNP: Invalid input(s): "POCBNP" CBG: No results for input(s): "GLUCAP" in the last 168 hours. D-Dimer No results for input(s): "DDIMER" in the last 72 hours. Hgb A1c No results for input(s): "HGBA1C" in the last 72 hours. Lipid Profile No results for input(s): "CHOL", "HDL", "LDLCALC", "TRIG", "CHOLHDL", "LDLDIRECT" in the last 72 hours. Thyroid function studies No results for input(s): "TSH",  "T4TOTAL", "T3FREE", "THYROIDAB" in the last 72 hours.  Invalid input(s): "FREET3" Anemia work up No results for input(s): "VITAMINB12", "FOLATE", "FERRITIN", "TIBC", "IRON", "RETICCTPCT" in the last 72 hours. Urinalysis    Component Value Date/Time   COLORURINE YELLOW 06/07/2021 0445   APPEARANCEUR CLEAR 06/07/2021 0445   LABSPEC <1.005 (L) 06/07/2021 0445   PHURINE 7.0 06/07/2021 0445   GLUCOSEU NEGATIVE 06/07/2021 0445   HGBUR NEGATIVE 06/07/2021 0445   BILIRUBINUR Negative 06/14/2021 [place] 06/07/2021 0445   PROTEINUR Positive (A) 06/14/2021 1605   PROTEINUR NEGATIVE 06/07/2021 0445   UROBILINOGEN 0.2 06/14/2021 1605   UROBILINOGEN 0.2 03/25/2012 1501   NITRITE Negative 06/14/2021 1605   NITRITE NEGATIVE 06/07/2021 0445   LEUKOCYTESUR Small (1+) (A) 06/14/2021 1605   LEUKOCYTESUR NEGATIVE 06/07/2021 0445   Sepsis Labs Recent Labs  Lab 04/06/22 1311 04/07/22 0304  WBC 8.1 5.9   Microbiology Recent Results (from the past 240 hour(s))  Resp Panel by RT-PCR (Flu A&B, Covid) Anterior Nasal Swab     Status: None   Collection Time: 04/06/22  1:11 PM   Specimen: Anterior Nasal Swab  Result Value Ref Range Status   SARS Coronavirus 2 by RT PCR NEGATIVE NEGATIVE Final    Comment: (NOTE) SARS-CoV-2 target nucleic acids are NOT DETECTED.  The SARS-CoV-2 RNA is generally detectable in upper respiratory specimens during the acute phase of infection. The lowest concentration of SARS-CoV-2 viral copies this assay can detect is 138 copies/mL. A negative result does not preclude SARS-Cov-2 infection and should not be used as the sole basis for treatment or other patient management decisions. A negative result may occur with  improper specimen collection/handling, submission of specimen other than nasopharyngeal swab, presence of viral mutation(s) within the areas targeted by this assay, and inadequate number of viral copies(<138 copies/mL). A negative result  must be combined with clinical observations, patient history, and epidemiological information. The expected result is Negative.  Fact Sheet for Patients:  EntrepreneurPulse.com.au  Fact Sheet for Healthcare Providers:  IncredibleEmployment.be  This test is no t yet approved or cleared by the [place] FDA and  has been authorized for detection and/or diagnosis of SARS-CoV-2 by FDA under an Emergency Use Authorization (EUA). This EUA will remain  in effect (meaning this test  can be used) for the duration of the COVID-19 declaration under Section 564(b)(1) of the Act, 21 U.S.C.section 360bbb-3(b)(1), unless the authorization is terminated  or revoked sooner.       Influenza A by PCR NEGATIVE NEGATIVE Final   Influenza B by PCR NEGATIVE NEGATIVE Final    Comment: (NOTE) The Xpert Xpress SARS-CoV-2/FLU/RSV plus assay is intended as an aid in the diagnosis of influenza from Nasopharyngeal swab specimens and should not be used as a sole basis for treatment. Nasal washings and aspirates are unacceptable for Xpert Xpress SARS-CoV-2/FLU/RSV testing.  Fact Sheet for Patients: EntrepreneurPulse.com.au  Fact Sheet for Healthcare Providers: IncredibleEmployment.be  This test is not yet approved or cleared by the [place] FDA and has been authorized for detection and/or diagnosis of SARS-CoV-2 by FDA under an Emergency Use Authorization (EUA). This EUA will remain in effect (meaning this test can be used) for the duration of the COVID-19 declaration under Section 564(b)(1) of the Act, 21 U.S.C. section 360bbb-3(b)(1), unless the authorization is terminated or revoked.  Performed at [facility], [place] [address], [place], [place] [address]       SIGNED:   Charlynne Cousins, MD  [facility] 04/12/2022, 7:14 AM Pager   If 7PM-7AM, please contact night-coverage [email or web address] Password  [email or web address]

## 2022-04-12 NOTE — TOC Transition Note (Signed)
Transition of Care [facility]) - CM/SW Discharge Note   Patient Details  Name: Kyle Lewis MRN: [id number] Date of Birth: 12-Jan-1946  [facility] CM/SW Contact:  Sharin Mons, RN Phone Number: 04/12/2022, 1:19 PM   Clinical Narrative:     Patient will DC EF:EOFH Anticipated DC date: 04/12/2022 Family notified:yes Transport by: car              -  COPD exacerbation  Per MD patient ready for DC today. RN, patient, patient's family, and [place] notified of DC. Pt states wife will assist with care if needed once d/c. Order noted for home oxygen. Per walking sat. qualifier pt doesn't qualify for home oxygen, NCM made MD  and pt aware.  Post hospital f/u noted on AVS. Pt without  Rx MED concerns.  Wife to provide transportation to home .  RNCM will sign off for now as intervention is no longer needed. Please consult [place] again if new needs arise.   Final next level of care: [place] Barriers to Discharge: No Barriers Identified   Patient Goals and CMS Choice Patient states their goals for this hospitalization and ongoing recovery are:: To return home CMS Medicare.gov Compare Post Acute Care list provided to:: Patient Choice offered to / list presented to : Patient  Discharge Placement                       Discharge Plan and Services   Discharge Planning Services: CM Consult Post Acute Care Choice: Home Health          DME Arranged: N/A DME Agency: NA       HH Arranged: PT HH Agency: [facility] Date [place]: 04/07/22 Time [place]: 2197 Representative spoke with at [place]: [place] ([place]) Interventions     Readmission Risk Interventions    12/16/2021    2:11 PM 06/10/2021   10:04 AM  Readmission Risk Prevention Plan  Transportation Screening Complete Complete  PCP or Specialist Appt within 5-7 Days Complete Complete  Home Care Screening  Complete Complete  Medication Review (RN CM) Complete Complete

## 2022-04-12 NOTE — Progress Notes (Signed)
Physical Therapy Treatment Patient Details Name: Kyle Lewis MRN: [id number] DOB: August 22, 1945 Today's Date: 04/12/2022   History of Present Illness Pt is a 76 y.o. male admitted 11/2 for COPD exacerbation. PMH: HTN, COPD, OSA, CAD, AAA, chronic back pain, TVAR endocarditis, lumbar discitis with epidural abscess L5-S1.    PT Comments    Pt received in supine, agreeable to therapy session, with good participation in functional mobility tasks. Pt needing safety reminders for RW brakes with transfers, however is self-directed and states he prefers brakes off; pt spouse present and receptive. Pt needing up to min guard assist to perform functional mobility tasks with mostly min safety cues and performed seated/standing balance tasks while preparing for DC. Pt mildly dyspneic on exertion and able to pull ~700-1,000 mL on IS. Pt in care of NT and spouse to DC via WC at end of session. Pt continues to benefit from PT services to progress toward functional mobility goals.   Recommendations for follow up therapy are one component of a multi-disciplinary discharge planning process, led by the attending physician.  Recommendations may be updated based on patient status, additional functional criteria and insurance authorization.  Follow Up Recommendations  Home health PT     Assistance Recommended at Discharge PRN  Patient can return home with the following Help with stairs or ramp for entrance   Equipment Recommendations  None recommended by PT    Recommendations for Other Services       Precautions / Restrictions Precautions Precautions: Fall;Other (comment) Precaution Comments: watch sats Restrictions Weight Bearing Restrictions: No     Mobility  Bed Mobility Overal bed mobility: Needs Assistance Bed Mobility: Supine to Sit     Supine to sit: Supervision     General bed mobility comments: increased time to perform; pt using bed features/rails and HOB elevated     Transfers Overall transfer level: Needs assistance Equipment used: Rolling walker (2 wheels) Transfers: Sit to/from Stand Sit to Stand: Min guard           General transfer comment: from EOB and low toilet surfaces, cues to lock brakes on rollator prior to standing, pt prefers brakes unlocked per his report    Ambulation/Gait Ambulation/Gait assistance: Supervision Gait Distance (Feet): 15 Feet (x2 to/from bathroom) Assistive device: Rollator (4 wheels) Gait Pattern/deviations: Step-through pattern, Trunk flexed, Decreased stride length, Wide base of support Gait velocity: decreased     General Gait Details: significant trunk flexion throughout; fair RW use; distance limited as pt preparing for DC and transport tech arriving with wheelchair while pt exiting bathroom       Balance Overall balance assessment: Mild deficits observed, not formally tested         Standing balance support: Reliant on assistive device for balance (can stand with U UE support but needs rollator for gait) Standing balance-Leahy Scale: Poor (without UE support)                              Cognition Arousal/Alertness: Awake/alert Behavior During Therapy: WFL for tasks assessed/performed Overall Cognitive Status: Within Functional Limits for tasks assessed                                 General Comments: needs safety cues (pt unlocks rollator prior to standing)        Exercises Other Exercises Other Exercises: IS x 10 reps ~[id number]  mL    General Comments General comments (skin integrity, edema, etc.): VSS per chart review, mild DOE 1/4 with short distances      Pertinent Vitals/Pain Pain Assessment Pain Assessment: Faces Faces Pain Scale: Hurts a little bit Pain Location: low back pain (pt reports it is chronic and back surgery ~6 mos prior) Pain Descriptors / Indicators: Discomfort, Grimacing Pain Intervention(s): Monitored during session, Repositioned      PT Goals (current goals can now be found in the care plan section) Acute Rehab PT Goals Patient Stated Goal: home PT Goal Formulation: With patient Time For Goal Achievement: 04/21/22 Progress towards PT goals: Progressing toward goals    Frequency    Min 3X/week      PT Plan Current plan remains appropriate       AM-PAC PT "6 Clicks" Mobility   Outcome Measure  Help needed turning from your back to your side while in a flat bed without using bedrails?: None Help needed moving from lying on your back to sitting on the side of a flat bed without using bedrails?: A Little Help needed moving to and from a bed to a chair (including a wheelchair)?: A Little Help needed standing up from a chair using your arms (e.g., wheelchair or bedside chair)?: A Little Help needed to walk in hospital room?: A Little Help needed climbing 3-5 steps with a railing? : A Little 6 Click Score: 19    End of Session   Activity Tolerance: Patient tolerated treatment well;Other (comment) (time limited due to DC) Patient left: in chair;with nursing/sitter in room;with family/visitor present;Other (comment) (in care of NT to be transported home) Nurse Communication: Mobility status PT Visit Diagnosis: Muscle weakness (generalized) (M62.81);Difficulty in walking, not elsewhere classified (R26.2)     Time: 1300-1320 PT Time Calculation (min) (ACUTE ONLY): 20 min  Charges:  $Therapeutic Activity: 8-22 mins                     Meleah Demeyer P., PTA Acute Rehabilitation Services Secure Chat Preferred 9a-5:30pm Office: [facility] 04/12/2022, 1:27 PM

## 2022-04-13 ENCOUNTER — Telehealth: Payer: Self-pay | Admitting: *Deleted

## 2022-04-13 ENCOUNTER — Encounter: Payer: Self-pay | Admitting: *Deleted

## 2022-04-13 NOTE — Patient Outreach (Signed)
  Care Coordination TOC Note Transition Care Management Unsuccessful Follow-up Telephone Call  Date of discharge and from where:  Wednesday, 04/12/22 [facility]- COPD exacerbation  Attempts:  1st Attempt  Reason for unsuccessful TCM follow-up call:  Voice mail full  unable to leave voice message requesting call back  Oneta Rack, RN, BSN, CCRN Alumnus RN CM Care Coordination/ Transition of [place] Management [phone]: direct office

## 2022-04-14 ENCOUNTER — Telehealth: Payer: Self-pay | Admitting: *Deleted

## 2022-04-14 ENCOUNTER — Encounter: Payer: Self-pay | Admitting: *Deleted

## 2022-04-14 NOTE — Patient Outreach (Signed)
  Care Coordination TOC Note Transition Care Management Unsuccessful Follow-up Telephone Call  Date of discharge and from where:  Wednesday, 04/12/22[facility]- COPD exacerbation  Attempts:  2nd Attempt  Reason for unsuccessful TCM follow-up call:  Voice mail full unable to leave voice message requesting call back  Oneta Rack, RN, BSN, CCRN Alumnus RN CM Care Coordination/ Transition of [place] Management [phone]: direct office

## 2022-04-17 ENCOUNTER — Telehealth: Payer: Self-pay | Admitting: *Deleted

## 2022-04-17 ENCOUNTER — Encounter: Payer: Self-pay | Admitting: *Deleted

## 2022-04-17 NOTE — Patient Outreach (Signed)
Care Coordination TOC Note Transition Care Management Follow-up Telephone Call Date of discharge and from where: Wednesday, 04/12/22 [facility]- COPD exacerbation How have you been since you were released from the hospital? Per [name], spouse/ caregiver, on [place], "Overall he is doing okay since he got out of the hospital; the home health nurse is coming and they are wonderful; we have also switched PCP's as of Friday of last week, to [facility]-- they are coming to [place], coming to our home, and they are wonderful too!  They are coming back again this coming week.  My biggest concern, which is nothing new, is that I need help in the home caring for him.... but I am working on getting the [place] involved and hoping that they will give [place] some assistance" Any questions or concerns? Yes- wife reports very limited ability to care for patient; she reports patient is now established with [facility] as PCP-- she was encouraged to engage with LCSW at [facility] to assist in navigating arranging VA benefits/ care assistance at home-- reports had first PCP home visit with [facility] 04/14/22; reports [place] team to come again this week  Items Reviewed: Did the pt receive and understand the discharge instructions provided? Yes  Medications obtained and verified? No  declines medication review, reports no concerns Other? No  Any new allergies since your discharge? No  Dietary orders reviewed? No Do you have support at home? Yes  spouse/ caregiver provides assistance with all ADL's and iADL's  Home Care and Equipment/Supplies: Were home health services ordered? yes If so, what is the name of the agency? [facility]  Has the agency set up a time to come to the patient's home? Yes- spouse reports home health services well-established Were any new equipment or medical supplies ordered?  No What is the name of the medical supply agency? N/A Were you able to get the supplies/equipment? not  applicable Do you have any questions related to the use of the equipment or supplies? No N/A  Functional Questionnaire: (I = Independent and D = Dependent) ADLs: I  spouse/ caregiver provides assistance with all ADL's and iADL's  Bathing/Dressing- I  spouse/ caregiver provides assistance with all ADL's and iADL's  Meal Prep- I  spouse/ caregiver provides assistance with all ADL's and iADL's  Eating- I  Maintaining continence- I  spouse/ caregiver provides assistance with all ADL's and iADL's  Transferring/Ambulation- I  spouse/ caregiver provides assistance with all ADL's and iADL's  Managing Meds- I  spouse/ caregiver provides assistance with all ADL's and iADL's  Follow up appointments reviewed:  PCP Hospital f/u appt confirmed? Yes  Confirmed today with caregiver that patient is now established with [facility] as PCP and verified initial home visit was completed 04/14/22 [facility] Hospital f/u appt confirmed? No  Scheduled to see - on - @ - Are transportation arrangements needed? No  If their condition worsens, is the pt aware to call PCP or go to the Emergency Dept.? Yes Was the patient provided with contact information for the PCP's office or ED? No- caregiver confirms she has contact information Was to pt encouraged to call back with questions or concerns? Yes- verified RN CM from previous PCP office has scheduled telephone visit tomorrow 04/18/22- encouraged caregiver to engage with RN CM and she verbalizes understanding/ plans to do so  SDOH assessments and interventions completed:   Yes  Care Coordination Interventions Activated:  Yes   Care Coordination Interventions:  Reviewed recent PCP  office visit from new PCP home visit completed 04/14/22; encouraged caregiver to fully engage with new PCP team to assist with in-home care need assistance    Encounter Outcome:  Pt. Visit Completed    Oneta Rack, RN, BSN, CCRN Alumnus RN CM Care Coordination/ Transition  of [address] Management [phone]: direct office

## 2022-04-18 ENCOUNTER — Ambulatory Visit: Payer: Self-pay

## 2022-04-18 NOTE — Patient Outreach (Signed)
  Care Coordination   Follow Up Visit Note   04/18/2022 Name: Kyle Lewis MRN: [id number] DOB: [birth year]  Kyle Lewis is a 76 y.o. year old male who sees Kyle Honour, MD for primary care. I spoke with wife Kyle Lewis by phone today.  What matters to the patients health and wellness today?  Patient has transitioned his primary care to [facility].     Goals Addressed             This Visit's Progress    He is spitting up clear sputum and his lungs do not sound good       Care Coordination Interventions: Evaluation of current treatment plan related to upper respiratory infection and patient's adherence to plan as established by provider Determined patient was discharged home last week Discussed with wife Kyle Lewis, patient's COPD is stable and he was assessed by Kyle Lewis following his discharge Determined wife Kyle Lewis will call the [place] resources provided to request additional personal care services if patient is eligible  Determined patient and wife Kyle Lewis have transitioned their primary care to [facility] Routed note to Dr. Alain Lewis to make him aware        SDOH assessments and interventions completed:  No     Care Coordination Interventions Activated:  Yes  Care Coordination Interventions:  Yes, provided   Follow up plan: No further intervention required.   Encounter Outcome:  Pt. Visit Completed

## 2022-04-18 NOTE — Patient Instructions (Signed)
Visit Information  Thank you for taking time to visit with me today. Please don't hesitate to contact me if I can be of assistance to you.   Following are the goals we discussed today:   Goals Addressed             This Visit's Progress    He is spitting up clear sputum and his lungs do not sound good       Care Coordination Interventions: Evaluation of current treatment plan related to upper respiratory infection and patient's adherence to plan as established by provider Determined patient was discharged home last week Discussed with wife [name], patient's COPD is stable and he was assessed by [place] following his discharge Determined wife [name] will call the [place] resources provided to request additional personal care services if patient is eligible  Determined patient and wife [name] have transitioned their primary care to [facility] Routed note to Dr. [name] to make him aware       If you are experiencing a Mental Health or [place] or need someone to talk to, please call 1-800-273-TALK (toll free, 24 hour hotline)  Patient verbalizes understanding of instructions and care plan provided today and agrees to view in [place]. Active MyChart status and patient understanding of how to access instructions and care plan via MyChart confirmed with patient.     Barb Merino, RN, BSN, CCM Care Management Coordinator [facility] Care Management Direct Phone: [phone]

## 2022-05-10 ENCOUNTER — Telehealth: Payer: Self-pay

## 2022-05-10 ENCOUNTER — Other Ambulatory Visit: Payer: Self-pay | Admitting: Internal Medicine

## 2022-05-10 NOTE — Telephone Encounter (Signed)
Advised wife per Dr Juleen China we will send one month of Amoxicillin and discuss further refills at appt Monday. Sent to [facility].

## 2022-05-15 ENCOUNTER — Ambulatory Visit (INDEPENDENT_AMBULATORY_CARE_PROVIDER_SITE_OTHER): Payer: Medicare Other | Admitting: Internal Medicine

## 2022-05-15 ENCOUNTER — Encounter: Payer: Self-pay | Admitting: Internal Medicine

## 2022-05-15 ENCOUNTER — Other Ambulatory Visit: Payer: Self-pay

## 2022-05-15 VITALS — BP 171/85 | HR 63 | Temp 98.1°F | Ht 66.0 in | Wt 220.0 lb

## 2022-05-15 DIAGNOSIS — T826XXA Infection and inflammatory reaction due to cardiac valve prosthesis, initial encounter: Secondary | ICD-10-CM | POA: Diagnosis not present

## 2022-05-15 DIAGNOSIS — G062 Extradural and subdural abscess, unspecified: Secondary | ICD-10-CM | POA: Diagnosis not present

## 2022-05-15 DIAGNOSIS — T826XXD Infection and inflammatory reaction due to cardiac valve prosthesis, subsequent encounter: Secondary | ICD-10-CM | POA: Diagnosis present

## 2022-05-15 DIAGNOSIS — M00812 Arthritis due to other bacteria, left shoulder: Secondary | ICD-10-CM

## 2022-05-15 DIAGNOSIS — I38 Endocarditis, valve unspecified: Secondary | ICD-10-CM

## 2022-05-15 MED ORDER — AMOXICILLIN 500 MG PO CAPS: 500.0000 mg | ORAL_CAPSULE | Freq: Three times a day (TID) | ORAL | 5 refills | Status: AC

## 2022-05-15 NOTE — Progress Notes (Signed)
[facility]  CHIEF COMPLAINT:    Follow up for Gemella PVE  SUBJECTIVE:    Kyle Lewis is a 76 y.o. 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   He was admitted at [facility] from 6/8-7/14/23.  There was concern for endocarditis as patient has a TAVR, suspected 2/2 odontogenic source due to poor dentition.    TTE showed possible aortic valve vegetation, TEE showed concern for aortic valve vegetation as well.  CTA chest done showed no abscess.     MRI lumbar spine showed L5-S1 discitis/OM and epidural fluid collection.  Patient also complained of left shoulder pain, Ortho engaged and patient underwent aspiration 7/20 with WBC of 27 K and negative cultures on several days of antibiotics.     Seen by cardiology who did not think  findings were necessarily reflective of endocarditis and recommended antibiotics for management of epidural abscess.     Seen by neurosurgery Dr. Ronnald Ramp whom held off on decompression at the time, recommended observation and antibiotics.     Seen by dentistry and recommended outpatient follow-up as the patient needed radiographs at follow-up assessment.   His course of 8 weeks IV antibiotics concluded on 01/30/22.  PICC line removed.  Started on oral antibiotics with amoxicillin 1gm TID which he has continued since that time.  Had repeat MRI 01/30/22 showing L5-S1 discitis/OM however the phlegmon/abscess was decreased.  He had another MRI scheduled for 03/13/22 with Dr Ronnald Ramp which showed findings consistent with discitis/OM at L5-S1 with interval decrease in possible residual abscess/phlegmon.  He was admitted at [facility] from 11/2-11/8 due to COPD exacerbation.  He was continued on antibiotics following that hospital discharge.   Patient was last seen by me on 02/21/22 and continued on amoxicillin with transition to suppressive '500mg'$  TID for indefinite duration at that time.       Please see A&P for the details of today's visit and status of the patient's medical problems.   Patient's Medications  New Prescriptions   No medications on file  Previous Medications   ACETAMINOPHEN (TYLENOL) 500 MG TABLET    Take 1,000 mg by mouth every 6 (six) hours as needed for moderate pain.   AMLODIPINE (NORVASC) 10 MG TABLET    Take 1 tablet (10 mg total) by mouth daily.   ATORVASTATIN (LIPITOR) 40 MG TABLET    Take 1 tablet (40 mg total) by mouth daily.   BUPRENORPHINE (BUTRANS) 5 MCG/HR PTWK    1 patch once a week.   FINASTERIDE (PROSCAR) 5 MG TABLET    Take 1 tablet (5 mg total) by mouth daily.   GABAPENTIN (NEURONTIN) 300 MG CAPSULE    Take 1 capsule (300 mg total) by mouth 2 (two) times daily.   LISINOPRIL (ZESTRIL) 5 MG TABLET    Take 2 tablets (10 mg total) by mouth daily.   METOPROLOL TARTRATE (LOPRESSOR) 25 MG TABLET    Take 1 tablet (25 mg total) by mouth 2 (two) times daily.   OXYCODONE (OXY IR/ROXICODONE) 5 MG IMMEDIATE RELEASE TABLET    Take 1 tablet (5 mg total) by mouth every 6 (six) hours as needed for severe pain.   PREDNISONE (DELTASONE) 10 MG TABLET    Takes 5 tablets for 1 day, then 4 tablets for 1 day, then 3 tablets for 1 day, then 2 tabs for 1 day, then 1 tab for 1 day, and then  stop.   SENNA-DOCUSATE (SENOKOT-S) 8.6-50 MG TABLET    Take 1 tablet by mouth 2 (two) times daily.   SERTRALINE (ZOLOFT) 50 MG TABLET    Take 1 tablet (50 mg total) by mouth daily.   TAMSULOSIN (FLOMAX) 0.4 MG CAPS CAPSULE    TAKE 1 CAPSULE BY MOUTH ONCE DAILY AFTER SUPPER  Modified Medications   Modified Medication Previous Medication   AMOXICILLIN (AMOXIL) 500 MG CAPSULE amoxicillin (AMOXIL) 500 MG capsule      Take 1 capsule (500 mg total) by mouth 3 (three) times daily.    TAKE 1 CAPSULE BY MOUTH THREE TIMES DAILY  Discontinued Medications   No medications on file      Past Medical History:  Diagnosis Date   AAA (abdominal aortic aneurysm) (HCC)    5 cm AAA, 2.7 cm LCIA  aneurysm 05/2015   Abnormal gait 07/31/2019   Abscess of left leg    Acute on chronic diastolic heart failure ([place])    Acute respiratory failure with hypoxia and hypercapnia ([place]) 05/27/2021   HC03   06/14/21    =  31  - 06/23/2021   Walked 250 ft  at a slow pace with a cane. Complained of back and left knee hurting with lowest sat 89% so d/c'd 02    Adenomatous colon polyp    Adrenal mass ([place])    2 benign appearing left adrenal adenomas noted on 01/13/15 CT   Allergy    seasonal   Anxiety 04/08/2020   Arthritis    Blister of multiple sites of lower extremity 01/10/2018   Body mass index (BMI) 34.0-34.9, adult 04/07/2020   BPH with obstruction/lower urinary tract symptoms    Overview:  Probable based on symptoms   CAD (coronary artery disease) 02/01/2018   Carotid arterial disease ([place]) 05/23/2012   Carotid artery occlusion    Chronic back pain    Cigarette smoker 09/05/2018   COLONIC POLYPS, ADENOMATOUS, HX OF 03/17/2010   Qualifier: Diagnosis of  By: Harlon Ditty CMA (AAMA), [place]     COPD GOLD II 04/07/2019   Quit smoking 03/2019 - PFT's  04/07/2019  FEV1 1.58 (57 % ) ratio 0.54  p 0 % improvement from saba p nothing prior to study with DLCO  15.22 (66%) corrects to 3.05 (74%)  for alv volume and FV curve concave classically     Degeneration of cervical intervertebral disc    Degeneration of lumbar intervertebral disc 06/14/2015   Depression 03/17/2010   Qualifier: Diagnosis of  By: Nelson-Smith CMA (AAMA), Dottie     Diverticulosis    DIVERTICULOSIS, COLON 03/17/2010   Qualifier: Diagnosis of  By: Harlon Ditty CMA (AAMA), Dottie     Dyspnea    Exogenous obesity 11/08/2013   Overview:  With a nine pound weight gain since his last visit   Heart murmur    History of back surgery    Rods and Screws in back   History of craniotomy 01/11/2018   History of left knee replacement    History of right MCA stroke 07/21/2014   Hyperlipidemia LDL goal <70 01/19/2018   Hypertension    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 Oneida Alar and 06/10/2015 last visit   Impairment of balance 10/27/2021   Inflammation of sacroiliac joint ([place]) 05/06/2019   Lower urinary tract symptoms (LUTS) 08/06/2017   Meningioma ([place])    Microscopic hematuria 11/08/2013  Mitral regurgitation 06/02/2021   Muscle weakness 10/27/2021   Neck pain 03/25/2019   Nocturia    OA (osteoarthritis) of knee 04/01/2012   OAB (overactive bladder) 01/19/2014   OSA (obstructive sleep apnea)    Bipap per Chodri since ? 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 MAngelena Form& Dr. BCyndia Bent  Severe aortic stenosis 02/01/2018   ECHO 01/30/18 - Left ventricle: The cavity size was normal. There was moderate   focal basal hypertrophy of the septum. Systolic function was   vigorous. The estimated ejection fraction was in the range of 65%   to 70%. Wall motion was normal; there were no regional wall   motion abnormalities. Doppler parameters are consistent with   abnormal left ventricular relaxation (grade 1 diastolic   dysfun   Status post AAA (abdominal aortic aneurysm) repair 02/01/2018   Stroke ([place] 2013   tia no residual deficit from   Tobacco use 11/08/2013   Ventral hernia 11/08/2013   Overview:  S/p repair with alloderm mesh and s/p SB resection    Social History   Tobacco Use   Smoking status: Former    Packs/day: 0.75    Years: 40.00    Total pack years: 30.00    Types:  Cigarettes    Quit date: 05/05/2021    Years since quitting: 1.0   Smokeless tobacco: Never   Tobacco comments:    1/2 pack a day if that   Vaping Use   Vaping Use: Never used  Substance Use Topics   Alcohol use: Yes    Comment: rare   Drug use: No    Family History  Problem Relation Age of Onset   Heart disease Mother        Onset ~~3y/o   Hypertension Mother        Deceased from old age at 856  Hyperlipidemia Mother    Arthritis Mother    Diabetes Father        Deceased from old age at 913  Heart attack Father    Heart disease Father        CABG at age 76  Dementia Father 843  Arthritis Father    Prostate cancer Maternal Grandfather    Colon cancer Neg Hx    Esophageal cancer Neg Hx    Rectal cancer Neg Hx    Stomach cancer Neg Hx     Allergies  Allergen Reactions   Cefazolin Rash and Other (See Comments)    The patient had surgery and was given cefazolin intraop. ~ 10 days later he developed a rash confirmed by biopsy to be consistent w/ drug eruption. We cannot know for sure, but this is the most likely agent.      Review of Systems  All other systems reviewed and are negative.    OBJECTIVE:    Vitals:   05/15/22 1534 05/15/22 1537  BP:  (Marland Kitchen  171/85  Pulse:  63  Temp:  98.1 F (36.7 C)  TempSrc:  Temporal  SpO2:  93%  Weight: 220 lb (99.8 kg)   Height: '5\' 6"'$  (1.676 m)    Body mass index is 35.51 kg/m.  Physical Exam Constitutional:      Appearance: Normal appearance.  HENT:     Head: Normocephalic and atraumatic.     Mouth/Throat:     Mouth: Mucous membranes are moist.     Pharynx: Oropharynx is clear.  Eyes:     Extraocular Movements: Extraocular movements intact.     Conjunctiva/sclera: Conjunctivae normal.  Pulmonary:     Effort: Pulmonary effort is normal. No respiratory distress.  Abdominal:     General: There is no distension.     Palpations: Abdomen is soft.  Musculoskeletal:        General: Normal range of motion.      Cervical back: Normal range of motion and neck supple.  Skin:    General: Skin is warm and dry.  Neurological:     General: No focal deficit present.     Mental Status: He is alert and oriented to person, place, and time. Mental status is at baseline.  Psychiatric:        Mood and Affect: Mood normal.      Labs and Microbiology:    Latest Ref Rng & Units 04/07/2022    3:04 AM 04/06/2022    1:11 PM 03/23/2022    2:20 PM  CBC  WBC 4.0 - 10.5 K/uL 5.9  8.1  6.9   Hemoglobin 13.0 - 17.0 g/dL 13.8  13.9  13.9   Hematocrit 39.0 - 52.0 % 40.9  41.7  43.4   Platelets 150 - 400 K/uL 170  174  196       Latest Ref Rng & Units 04/07/2022    3:04 AM 04/06/2022    1:11 PM 03/23/2022    2:20 PM  CMP  Glucose 70 - 99 mg/dL 123  116  141   BUN 8 - 23 mg/dL '23  18  21   '$ Creatinine 0.61 - 1.24 mg/dL 1.03  0.83  0.80   Sodium 135 - 145 mmol/L 139  142  140   Potassium 3.5 - 5.1 mmol/L 4.6  4.0  4.2   Chloride 98 - 111 mmol/L 103  104  104   CO2 22 - 32 mmol/L '27  27  27   '$ Calcium 8.9 - 10.3 mg/dL 8.8  9.0  9.1   Total Protein 6.5 - 8.1 g/dL   6.6   Total Bilirubin 0.3 - 1.2 mg/dL   0.8   Alkaline Phos 38 - 126 U/L   100   AST 15 - 41 U/L   17   ALT 0 - 44 U/L   16       ASSESSMENT & PLAN:    No problem-specific Assessment & Plan notes found for this encounter.   No orders of the defined types were placed in this encounter.    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  -CTA on 7/11 showed no abscess. -Completed 8 weeks of IV antibiotics with PCN (plus 2 weeks of gentamicin for synergy) on 01/30/22 followed by high  dose amoxicillin x 4 weeks through 02/27/22 -Now on suppressive amoxicillin '500mg'$   TID since that time -Source likely dental as gemella is found in oral cavity. Dentistry recommend outpatient follow-up as they were not able to do radiographs.  #Epidural abscess, discitis/OM L5-S1   -Epidural abcsess present on MRI without contrast on 7/5 and 7/8 -Neurosurgery, Dr. Sherley Bounds was consulted, recommended observation on antibiotics.  Noted that intervention may involve fusion with instrumentation/defer decompression at that time. -Repeat MRI lumbar spine 01/30/22 shows decreasing phlegmon.  Not surprisingly still with changes of OM/discitis.  Similar results noted on 03/13/22. -Antibiotics as above and has now completed 12 weeks of treatment dosing with combination of IV/PO therapy followed by long term suppression   #Septic arthritis of left shoulder status post aspiration - Synovial cell count 27.5 K WBC, 92% neutrophils, 4% Lymphs, 4% monocytes, Cx NG -No new shoulder issues at this visit     Plan:   -Continue oral Amoxicillin '500mg'$  PO TID -Duration of therapy likely lifelong as long as tolerated since I think it is challenging to say he did not have TAVR endocarditis based on Echo findings and disseminated infection -Follow up in 6 months.   [facility] 05/15/2022, 3:57 PM

## 2022-06-12 ENCOUNTER — Other Ambulatory Visit: Payer: Self-pay | Admitting: Physician Assistant

## 2022-06-12 DIAGNOSIS — Z952 Presence of prosthetic heart valve: Secondary | ICD-10-CM

## 2022-06-14 ENCOUNTER — Other Ambulatory Visit (HOSPITAL_COMMUNITY): Payer: Medicare Other

## 2022-06-14 ENCOUNTER — Ambulatory Visit: Payer: Medicare Other

## 2022-06-20 ENCOUNTER — Ambulatory Visit (INDEPENDENT_AMBULATORY_CARE_PROVIDER_SITE_OTHER): Payer: Medicare Other | Admitting: Podiatry

## 2022-06-20 DIAGNOSIS — M79674 Pain in right toe(s): Secondary | ICD-10-CM | POA: Diagnosis not present

## 2022-06-20 DIAGNOSIS — D689 Coagulation defect, unspecified: Secondary | ICD-10-CM

## 2022-06-20 DIAGNOSIS — M79675 Pain in left toe(s): Secondary | ICD-10-CM

## 2022-06-20 DIAGNOSIS — I739 Peripheral vascular disease, unspecified: Secondary | ICD-10-CM

## 2022-06-20 DIAGNOSIS — B351 Tinea unguium: Secondary | ICD-10-CM

## 2022-06-20 NOTE — Progress Notes (Signed)
  Subjective:  Patient ID: Kyle Lewis, male    DOB: 1945-10-03,  MRN: [id number]  Chief Complaint  Patient presents with   Nail Problem    Routine Foot Care     77 y.o. male presents with the above complaint. History confirmed with patient. Patient presenting with pain related to dystrophic thickened elongated nails. Patient is unable to trim own nails related to nail dystrophy and/or mobility issues. Patient does not have a history of T2DM. Also reports 2nd toe deformity right foot and interested in toe spacer device to separate 2nd toe from hallux.   Objective:  Physical Exam: warm, good capillary refill, DP and PT pulses 2/4 bilateral nail exam onychomycosis of the toenails, onycholysis, and dystrophic nails DP pulses palpable, PT pulses palpable, and protective sensation intact Left Foot:  Pain with palpation of nails due to elongation and dystrophic growth.  Right Foot: Pain with palpation of nails due to elongation and dystrophic growth. Rigid 2nd hammertoe deformity with 2nd toe medially deviated and abutting the lateral right hallux.   Assessment:   1. Pain due to onychomycosis of toenails of both feet   2. Coagulation defect ([place])   3. Peripheral vascular disease ([place])       Plan:  Patient was evaluated and treated and all questions answered.  #Onychomycosis with pain  -Nails palliatively debrided as below. -Educated on self-care  Procedure: Nail Debridement Rationale: Pain Type of Debridement: manual, sharp debridement. Instrumentation: Nail nipper, rotary burr. Number of Nails: 10  No follow-ups on file.         Everitt Amber, DPM [facility]

## 2022-08-01 NOTE — Progress Notes (Unsigned)
HEART AND [place]                                     Cardiology Office Note:    Date:  08/03/2022   ID:  Kyle SARRACINO, DOB 22-May-1946, MRN [id number]  PCP:  Pcp, No  [facility] Cardiologist:  Jenean Lindau, MD  / Dr. Angelena Form & Dr. Cyndia Bent (TAVR)  [facility] Electrophysiologist:  None   Referring MD: [name], MD   Chief Complaint  Patient presents with   Follow-up    1 yr s/p TAVR    History of Present Illness:    Kyle Lewis is a 77 y.o. male with a hx of medically managed CAD, AAA s/p repair 2016, carotid artery disease with known RICA occlusion and [id number] LICA disease, chronic wounds, OSA on CPAP, hx of CVA/TIA, COPD on home 02 with ongoing tobacco use, HTN and severe AS s/p TAVR (06/07/21) who presents to clinic for follow up.    Mr. [name] was admitted for acute hypoxic respiratory failure which was multifactorial due to underlying COPD, aortic stenosis, and diastolic heart failure requiring IV diruesis. He was diagnosed with severe AS. [facility] 12/27 showed moderate CAD to be managed medically. He underwent successful TAVR with a 29 mm Edwards Sapien 3 THV via the TF approach on 06/07/21. Post op echo showed EF 60%, normally functioning TAVR with a mean gradient of 16.5 mmHg and no PVL as well as mild MR.    In follow up, he was doing ok with no specific complaints. Today he is here with his wife. He initially tells me that he is doing good however his wife states that his respiratory status continues to decline. He has [facility] RN and PCP who watch him closely. She tells me that he smoked several weeks back and has been doing worse sine that time. He is now O2 dependent. He does not do much because of his breathing. He has auditory wheezing from across the room today. He is currently on a steroid taper which his wife says is helping. He does not appear volume overloaded. He has followed with pulmonology in the past but has  not recently.   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   Abnormal gait 07/31/2019   Abscess of left leg    Acute on chronic diastolic heart failure (HCC)    Acute respiratory failure with hypoxia and hypercapnia ([place]) 05/27/2021   HC03   06/14/21    =  31  - 06/23/2021   Walked 250 ft  at a slow pace with a cane. Complained of back and left knee hurting with lowest sat 89% so d/c'd 02    Adenomatous colon polyp    Adrenal mass ([place])    2 benign appearing left adrenal adenomas noted on 01/13/15 CT   Allergy    seasonal   Anxiety 04/08/2020   Arthritis    Blister of multiple sites of lower extremity 01/10/2018   Body mass index (BMI) 34.0-34.9, adult 04/07/2020   BPH with obstruction/lower urinary tract symptoms    Overview:  Probable based on symptoms   CAD (coronary artery disease) 02/01/2018   Carotid arterial disease ([place]) 05/23/2012   Carotid artery occlusion    Chronic back pain  Cigarette smoker 09/05/2018   COLONIC POLYPS, ADENOMATOUS, HX OF 03/17/2010   Qualifier: Diagnosis of  By: Nelson-Smith CMA (AAMA), Dottie     COPD GOLD II 04/07/2019   Quit smoking 03/2019 - PFT's  04/07/2019  FEV1 1.58 (57 % ) ratio 0.54  p 0 % improvement from saba p nothing prior to study with DLCO  15.22 (66%) corrects to 3.05 (74%)  for alv volume and FV curve concave classically     Degeneration of cervical intervertebral disc    Degeneration of lumbar intervertebral disc 06/14/2015   Depression 03/17/2010   Qualifier: Diagnosis of  By: Nelson-Smith CMA (AAMA), Dottie     Diverticulosis    DIVERTICULOSIS, COLON 03/17/2010   Qualifier: Diagnosis of  By: Harlon Ditty CMA (AAMA), Dottie     Dyspnea    Exogenous obesity 11/08/2013   Overview:  With a nine pound weight gain since his last visit   Heart murmur    History of back surgery    Rods and Screws in back   History of craniotomy 01/11/2018   History of left knee  replacement    History of right MCA stroke 07/21/2014   Hyperlipidemia LDL goal <70 01/19/2018   Hypertension    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 Oneida Alar and 06/10/2015 last visit   Impairment of balance 10/27/2021   Inflammation of sacroiliac joint ([place]) 05/06/2019   Lower urinary tract symptoms (LUTS) 08/06/2017   Meningioma ([place])    Microscopic hematuria 11/08/2013   Mitral regurgitation 06/02/2021   Muscle weakness 10/27/2021   Neck pain 03/25/2019   Nocturia    OA (osteoarthritis) of knee 04/01/2012   OAB (overactive bladder) 01/19/2014   OSA (obstructive sleep apnea)    Bipap per [place] since ? 2018 - Download 09/05/2018 used > 4 h x > 92% of days and avg use 8 h 54mn with AHI 3.1 @ 6 ipap and 10 epap    Osteoarthritis of right glenohumeral joint 08/14/2017   Other specified postprocedural states 09/25/2017   Peripheral vascular disease ([place]    Pre-procedure lab exam 01/19/2018   Preop cardiovascular exam 10/24/2018   Ringing in ear    ([place])   S/P lumbar spinal fusion 07/07/2015   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   s/p TAVR with a 29 mm Edwards S3U via the TF approach by Dr. MAngelena Form& Dr. BCyndia Bent  Severe aortic stenosis 02/01/2018   ECHO 01/30/18 - Left ventricle: The cavity size was normal. There was moderate   focal basal hypertrophy of the septum. Systolic function was   vigorous. The estimated ejection fraction was in the range of 65%   to 70%. Wall motion was normal; there were no regional wall   motion abnormalities. Doppler parameters are consistent with   abnormal left ventricular relaxation (grade 1 diastolic   dysfun   Status post AAA (abdominal aortic aneurysm) repair 02/01/2018   Stroke ([place] 2013   tia no residual deficit from   Tobacco use 11/08/2013   Ventral hernia 11/08/2013   Overview:  S/p repair with alloderm mesh and s/p SB resection     Past Surgical History:  Procedure Laterality Date   ABDOMINAL AORTIC ANEURYSM REPAIR  2019   [place] as child   BACK SURGERY  2018   Rods and Screws in Back lower back   BRAIN MENINGIOMA EXCISION  [place]     left   EYE SURGERY Bilateral    Cataract   IRRIGATION AND DEBRIDEMENT ABSCESS Left 08/25/2020   Procedure: IRRIGATION AND DEBRIDEMENT ABSCESS LEFT LEG;  Surgeon: Evelina Bucy, DPM;  Location: [place];  Service: Podiatry;  Laterality: Left;   JOINT REPLACEMENT Left 04-01-12   Knee   LEFT HEART CATH AND CORONARY ANGIOGRAPHY N/A 09/15/2016   Procedure: Left Heart Cath and Coronary Angiography;  Surgeon: Nelva Bush, MD;  Location: [place] [facility];  Service: Cardiovascular;  Laterality: N/A;   MAXIMUM ACCESS (MAS)POSTERIOR LUMBAR INTERBODY FUSION (PLIF) 1 LEVEL N/A 07/07/2015   Procedure:  POSTERIOR LUMBAR INTERBODY FUSION (PLIF) Lumbar Four-Five with Pedicle Screw Fixation Lumbar Two-Five;Laminectomy Lumbar Two-Five;  Surgeon: Eustace Moore, MD;  Location: [place] [facility];  Service: Neurosurgery;  Laterality: N/A;   POSTERIOR LUMBAR INTERBODY FUSION (PLIF) Lumbar Four-Five with Pedicle Screw Fixation Lumbar Two-Five;Laminectomy Lumbar Two-Five   RIGHT/LEFT HEART CATH AND CORONARY ANGIOGRAPHY N/A 01/24/2018   Procedure: RIGHT/LEFT HEART CATH AND CORONARY ANGIOGRAPHY;  Surgeon: Nelva Bush, MD;  Location: [place] [facility];  Service: Cardiovascular;  Laterality: N/A;   RIGHT/LEFT HEART CATH AND CORONARY ANGIOGRAPHY N/A 05/31/2021   Procedure: RIGHT/LEFT HEART CATH AND CORONARY ANGIOGRAPHY;  Surgeon: Burnell Blanks, MD;  Location: [place] [facility];  Service: Cardiovascular;  Laterality: N/A;   TEE WITHOUT CARDIOVERSION N/A 06/07/2021   Procedure: TRANSESOPHAGEAL ECHOCARDIOGRAM (TEE);  Surgeon: Burnell Blanks, MD;  Location: [place];  Service: Open Heart Surgery;  Laterality: N/A;   TEE  WITHOUT CARDIOVERSION N/A 12/08/2021   Procedure: TRANSESOPHAGEAL ECHOCARDIOGRAM (TEE);  Surgeon: Berniece Salines, DO;  Location: [place] [facility];  Service: Cardiovascular;  Laterality: N/A;   TONSILLECTOMY  as child   TOTAL KNEE ARTHROPLASTY  04/01/2012   Procedure: TOTAL KNEE ARTHROPLASTY;  Surgeon: Gearlean Alf, MD;  Location: [facility];  Service: Orthopedics;  Laterality: Left;   TRANSCATHETER AORTIC VALVE REPLACEMENT, TRANSFEMORAL Bilateral 06/07/2021   Procedure: TRANSCATHETER AORTIC VALVE REPLACEMENT, RIGHT TRANSFEMORAL;  Surgeon: Burnell Blanks, MD;  Location: [place];  Service: Open Heart Surgery;  Laterality: Bilateral;   TRANSURETHRAL RESECTION OF PROSTATE     ULTRASOUND GUIDANCE FOR VASCULAR ACCESS Bilateral 06/07/2021   Procedure: ULTRASOUND GUIDANCE FOR VASCULAR ACCESS;  Surgeon: Burnell Blanks, MD;  Location: [place];  Service: Open Heart Surgery;  Laterality: Bilateral;   UMBILICAL HERNIA REPAIR  2011    Current Medications: Current Meds  Medication Sig   acetaminophen (TYLENOL) 500 MG tablet Take 1,000 mg by mouth every 6 (six) hours as needed for moderate pain.   albuterol (VENTOLIN HFA) 108 (90 Base) MCG/ACT inhaler Inhale into the lungs 3 (three) times daily.   amLODipine (NORVASC) 10 MG tablet Take 1 tablet (10 mg total) by mouth daily.   amoxicillin (AMOXIL) 500 MG capsule Take 1 capsule (500 mg total) by mouth 3 (three) times daily.   buprenorphine (BUTRANS) 5 MCG/HR PTWK 1 patch once a week.   finasteride (PROSCAR) 5 MG tablet Take 1 tablet (5 mg total) by mouth daily.   furosemide (LASIX) 20 MG tablet Take 1-2 tablets (20-40 mg total) by mouth as needed for fluid or edema.   gabapentin (NEURONTIN) 300 MG capsule Take 1 capsule (300 mg total) by mouth 2 (two) times daily.   ipratropium-albuterol (DUONEB) 0.5-2.5 (3) MG/3ML SOLN 3 (three) times daily.   metoprolol tartrate (LOPRESSOR) 25 MG tablet Take 1 tablet (25 mg  total) by mouth 2 (two) times daily.   oxyCODONE  (OXY IR/ROXICODONE) 5 MG immediate release tablet Take 1 tablet (5 mg total) by mouth every 6 (six) hours as needed for severe pain.   predniSONE (DELTASONE) 10 MG tablet Takes 5 tablets for 1 day, then 4 tablets for 1 day, then 3 tablets for 1 day, then 2 tabs for 1 day, then 1 tab for 1 day, and then stop.   sertraline (ZOLOFT) 50 MG tablet Take 1 tablet (50 mg total) by mouth daily.   tamsulosin (FLOMAX) 0.4 MG CAPS capsule TAKE 1 CAPSULE BY MOUTH ONCE DAILY AFTER SUPPER   [DISCONTINUED] furosemide (LASIX) 40 MG tablet Take 40 mg by mouth daily.     Allergies:   Cefazolin   Social History   Socioeconomic History   Marital status: Married    Spouse name: [name]    Number of children: 2   Years of education: College    Highest education level: Not on file  Occupational History    Comment: Self employed Administrator retired  Tobacco Use   Smoking status: Former    Packs/day: 0.75    Years: 40.00    Total pack years: 30.00    Types: Cigarettes    Quit date: 05/05/2021    Years since quitting: 1.2   Smokeless tobacco: Never   Tobacco comments:    1/2 pack a day if that   Vaping Use   Vaping Use: Never used  Substance and Sexual Activity   Alcohol use: Yes    Comment: rare   Drug use: No   Sexual activity: Not Currently  Other Topics Concern   Not on file  Social History Narrative   Patient lives at home with his wife. Rhonda    Patient has a Financial risk analyst.    Patient has 2 sons.    Patient smokes a half pack a day.          Social Determinants of Health   Financial Resource Strain: Low Risk  (02/13/2022)   Overall Financial Resource Strain (CARDIA)    Difficulty of Paying Living Expenses: Not very hard  Food Insecurity: No Food Insecurity (04/17/2022)   Hunger Vital Sign    Worried About Running Out of Food in the Last Year: Never true    Ran Out of Food in the Last Year: Never true  Transportation Needs: No Transportation Needs (04/17/2022)   PRAPARE -  Hydrologist (Medical): No    Lack of Transportation (Non-Medical): No  Physical Activity: Not on file  Stress: Not on file  Social Connections: Not on file     Family History: The patient's family history includes Arthritis in his father and mother; Dementia (age of onset: 18) in his father; Diabetes in his father; Heart attack in his father; Heart disease in his father and mother; Hyperlipidemia in his mother; Hypertension in his mother; Prostate cancer in his maternal grandfather. There is no history of Colon cancer, Esophageal cancer, Rectal cancer, or Stomach cancer.  ROS:   Please see the history of present illness.    All other systems reviewed and are negative.  EKGs/Labs/Other Studies Reviewed:    The following studies were reviewed today:  Echocardiogram 08/02/22:   1. Left ventricular ejection fraction, by estimation, is 60 to 65%. The  left ventricle has normal function. The left ventricle has no regional  wall motion abnormalities. There is moderate concentric left ventricular  hypertrophy. Left ventricular  diastolic parameters are consistent with Grade I diastolic dysfunction  (impaired relaxation).   2. Right ventricular systolic function is normal. The right ventricular  size is normal. There is normal pulmonary artery systolic pressure.   3. Left atrial size was moderately dilated.   4. The mitral valve is normal in structure. Trivial mitral valve  regurgitation. No evidence of mitral stenosis.   5. The aortic valve has been repaired/replaced. Aortic valve  regurgitation is not visualized. No aortic stenosis is present. Aortic  valve mean gradient measures 13.0 mmHg. Aortic valve Vmax measures 2.41  m/s.   6. The inferior vena cava is normal in size with greater than 50%  respiratory variability, suggesting right atrial pressure of 3 mmHg.      05/31/21 RIGHT/LEFT HEART CATH AND CORONARY ANGIOGRAPHY    Conclusion       Prox  LAD lesion is 25% stenosed.   Dist Cx lesion is 40% stenosed.   Ost 2nd Mrg to 2nd Mrg lesion is 70% stenosed.   Dist RCA lesion is 60% stenosed.   Ost RPDA to RPDA lesion is 40% stenosed.   The LAD is a large caliber vessel that courses to the apex. There is mild mid LAD stenosis The Circumflex is a large caliber vessel that gives off a small obtuse marginal branch then 2 moderate caliber obtuse marginal branches. The second obtuse marginal branch has a moderate stenosis, unchanged from last cath in 2019.  The RCA is a large dominant artery. The distal RCA has a moderate stenosis, unchanged from last cath in 2019.  Severe aortic stenosis (mean gradient 50.6 mmHg, 59 mmHg, AVA 0.93 cm2).    Recommendations: Will continue workup for TAVR. His right and left heart pressures are only mildly abnormal following inpatient diuresis. Continue diuresis. The structural heart team will provide a formal consultation regarding his candidacy for TAVR.      ______________________     TAVR OPERATIVE NOTE     Date of Procedure:                06/07/2021   Preoperative Diagnosis:      Severe Aortic Stenosis    Postoperative Diagnosis:    Same    Procedure:        Transcatheter Aortic Valve Replacement - Percutaneous Right Transfemoral Approach             Edwards Sapien 3 THV (size 29 mm, model # 9600TFX, serial # [id number])              Co-Surgeons:                        Gaye Pollack, MD and Lauree Chandler, MD     Anesthesiologist:                  Jenita Seashore, MD   Echocardiographer:              Edmonia James, MD   Pre-operative Echo Findings: Severe aortic stenosis Normal left ventricular systolic function   Post-operative Echo Findings: No paravalvular leak Normal left ventricular systolic function     __________________    Echo 06/08/21: IMPRESSIONS  1. The aortic valve has been repaired/replaced. There is a 29 mm Edwards  Ultra, stented (TAVR) valve present in the aortic  position. Echo findings  are consistent with normal structure and function of the aortic valve  prosthesis.      Mean gradient 76mHg, DI 0.5.  No paravalvular leak.   2. Left ventricular ejection fraction, by estimation, is 60 to 65%. The  left ventricle has normal function. The left ventricle has no regional  wall motion abnormalities. There is severe concentric left ventricular  hypertrophy. Indeterminate diastolic  filling due to E-A fusion.   3. Right ventricular systolic function is normal. The right ventricular  size is normal. There is normal pulmonary artery systolic pressure. The  estimated right ventricular systolic pressure is 0000000 mmHg.   4. Left atrial size was mildly dilated.   5. The mitral valve is degenerative. There is moderate mitral annular  calcification. Trivial-to-mild mitral valve regurgitation.   6. The inferior vena cava is dilated in size with >50% respiratory  variability, suggesting right atrial pressure of 8 mmHg.   Comparison(s): Compared to prior TTE on 05/28/21, the patient is now s/p  TAVR with normal functioning prosthesis on doppler interrogation. Mitral  regurgitation now appears improved to trivial-to-mild (previously  moderate).    EKG:  EKG is not ordered today.    Recent Labs: 12/13/2021: Magnesium 2.2 03/23/2022: ALT 16 04/06/2022: B Natriuretic Peptide 52.4 04/07/2022: BUN 23; Creatinine, Ser 1.03; Hemoglobin 13.8; Platelets 170; Potassium 4.6; Sodium 139  Recent Lipid Panel    Component Value Date/Time   CHOL 159 05/31/2021 0419   CHOL 144 02/01/2018 1101   TRIG 64 05/31/2021 0419   HDL 58 05/31/2021 0419   HDL 48 02/01/2018 1101   CHOLHDL 2.7 05/31/2021 0419   VLDL 13 05/31/2021 0419   LDLCALC 88 05/31/2021 0419   LDLCALC 95 04/08/2020 1506    Physical Exam:    VS:  BP (!) 152/80   Pulse 65   Ht '5\' 8"'$  (1.727 m)   Wt 222 lb (100.7 kg)   SpO2 90%   BMI 33.75 kg/m     Wt Readings from Last 3 Encounters:  08/02/22 222 lb  (100.7 kg)  05/15/22 220 lb (99.8 kg)  04/06/22 218 lb (98.9 kg)    General: Well developed, well nourished, NAD Lungs: Bilateral upper and lower lobe wheezes and rales throughout all lung fields. Breathing is unlabored. Cardiovascular: RRR with S1 S2. No murmurs Extremities: No edema.  Neuro: Alert and oriented. No focal deficits. No facial asymmetry. MAE spontaneously. Psych: Responds to questions appropriately with normal affect.    ASSESSMENT/PLAN:    Severe AS s/p TAVR: Patient with NYHA class III symptoms however I feel this is mainly being driven from progressing COPD. Echo today shows stable valve function with a mean gradient at 13.0 mmHg, peak at 23.2 mmHg, and AVA by VTI at 1.63 cm with no PVL. SBE prophylaxis with amoxicillin. Continue ASA '81mg'$ . Low suspicion worsening respiratory symptoms are coming from valve, suspect worsening COPD. I recommended that he re-establish with pulmonology. Plan to follow with primary cardiology.   Chronic diastolic CHF: Appears euvolemic on exam today. Can take Lasix 20-'40mg'$  PRN for weight gain or LE edema.    CAD: Pre TAVR cath showed stable moderate non obstructive CAD. Continued medical therapy.    COPD/Tobacco abuse/Chronic hypoxic respiratory failure: Appears to have been worsening. Has been followed very closely with PCP and HH with recent chest imaging and prednisone therapy. He now is on home O2 most of the time. Recommend that he re-establish with pulmonary. Continues to smoke spontaneously which worsens his symtpoms.    Renal lesion: pre TAVR CT showed a hypodense exophytic 1.6 cm anterior interpolar left renal cortical lesion, indeterminate for renal neoplasm, mildly increased from  1.2 cm on 10/25/2018 MRI, not well evaluated on prior MRI due to artifact. Suggest attention on follow-up renal mass protocol CT abdomen without and with IV contrast in 3-6 months. Results discussed with patient. He was previously followed by Dr. Jonette Eva with  [facility] in [facility] but he moved to [place].He was previously advised to call the office and make an apt with the provider taking over his patients to follow this.   Medication Adjustments/Labs and Tests Ordered: Current medicines are reviewed at length with the patient today.  Concerns regarding medicines are outlined above.  No orders of the defined types were placed in this encounter.  Meds ordered this encounter  Medications   furosemide (LASIX) 20 MG tablet    Sig: Take 1-2 tablets (20-40 mg total) by mouth as needed for fluid or edema.    Dispense:  90 tablet    Refill:  3    Patient Instructions  Medication Instructions:  Your physician has recommended you make the following change in your medication:  TAKE LASIX 20-40 MG AS NEEDED.  *If you need a refill on your cardiac medications before your next appointment, please call your pharmacy*   Lab Work: NONE If you have labs (blood work) drawn today and your tests are completely normal, you will receive your results only by: [place] (if you have MyChart) OR A paper copy in the mail If you have any lab test that is abnormal or we need to change your treatment, we will call you to review the results.   Testing/Procedures: NONE   Follow-Up: At [facility], you and your health needs are our priority.  As part of our continuing mission to provide you with exceptional heart care, we have created designated Provider Care Teams.  These Care Teams include your primary Cardiologist (physician) and Advanced Practice Providers (APPs -  Physician Assistants and Nurse Practitioners) who all work together to provide you with the care you need, when you need it.  We recommend signing up for the patient portal called "MyChart".  Sign up information is provided on this After Visit Summary.  MyChart is used to connect with patients for Virtual Visits (Telemedicine).  Patients are able to view lab/test results, encounter  notes, upcoming appointments, etc.  Non-urgent messages can be sent to your provider as well.   To learn more about what you can do with MyChart, go to [email or web address].    Your next appointment:   2 month(s)  Provider:   Jyl Heinz, MD    Signed, Kathyrn Drown, NP  08/03/2022 10:33 AM    [place]

## 2022-08-02 ENCOUNTER — Ambulatory Visit: Payer: Medicare Other | Attending: Cardiology | Admitting: Cardiology

## 2022-08-02 ENCOUNTER — Ambulatory Visit (HOSPITAL_BASED_OUTPATIENT_CLINIC_OR_DEPARTMENT_OTHER): Payer: Medicare Other

## 2022-08-02 VITALS — BP 152/80 | HR 65 | Ht 68.0 in | Wt 222.0 lb

## 2022-08-02 DIAGNOSIS — F17210 Nicotine dependence, cigarettes, uncomplicated: Secondary | ICD-10-CM

## 2022-08-02 DIAGNOSIS — J42 Unspecified chronic bronchitis: Secondary | ICD-10-CM | POA: Diagnosis present

## 2022-08-02 DIAGNOSIS — Z952 Presence of prosthetic heart valve: Secondary | ICD-10-CM

## 2022-08-02 DIAGNOSIS — I350 Nonrheumatic aortic (valve) stenosis: Secondary | ICD-10-CM | POA: Diagnosis present

## 2022-08-02 DIAGNOSIS — J449 Chronic obstructive pulmonary disease, unspecified: Secondary | ICD-10-CM | POA: Diagnosis not present

## 2022-08-02 DIAGNOSIS — I10 Essential (primary) hypertension: Secondary | ICD-10-CM | POA: Insufficient documentation

## 2022-08-02 DIAGNOSIS — I2510 Atherosclerotic heart disease of native coronary artery without angina pectoris: Secondary | ICD-10-CM | POA: Insufficient documentation

## 2022-08-02 LAB — ECHOCARDIOGRAM COMPLETE
AR max vel: 1.55 cm2
AV Area VTI: 1.63 cm2
AV Area mean vel: 1.37 cm2
AV Mean grad: 13 mmHg
AV Peak grad: 23.2 mmHg
Ao pk vel: 2.41 m/s
Area-P 1/2: 2.94 cm2
S' Lateral: 3 cm

## 2022-08-02 MED ORDER — FUROSEMIDE 20 MG PO TABS: 20.0000 mg | ORAL_TABLET | ORAL | 3 refills | Status: AC | PRN

## 2022-08-02 NOTE — Patient Instructions (Addendum)
Medication Instructions:  Your physician has recommended you make the following change in your medication:  TAKE LASIX 20-40 MG AS NEEDED.  *If you need a refill on your cardiac medications before your next appointment, please call your pharmacy*   Lab Work: NONE If you have labs (blood work) drawn today and your tests are completely normal, you will receive your results only by: [place] (if you have MyChart) OR A paper copy in the mail If you have any lab test that is abnormal or we need to change your treatment, we will call you to review the results.   Testing/Procedures: NONE   Follow-Up: At [facility], you and your health needs are our priority.  As part of our continuing mission to provide you with exceptional heart care, we have created designated Provider Care Teams.  These Care Teams include your primary Cardiologist (physician) and Advanced Practice Providers (APPs -  Physician Assistants and Nurse Practitioners) who all work together to provide you with the care you need, when you need it.  We recommend signing up for the patient portal called "MyChart".  Sign up information is provided on this After Visit Summary.  MyChart is used to connect with patients for Virtual Visits (Telemedicine).  Patients are able to view lab/test results, encounter notes, upcoming appointments, etc.  Non-urgent messages can be sent to your provider as well.   To learn more about what you can do with MyChart, go to [email or web address].    Your next appointment:   2 month(s)  Provider:   Jyl Heinz, MD

## 2022-08-24 ENCOUNTER — Other Ambulatory Visit: Payer: Self-pay

## 2022-08-24 DIAGNOSIS — N289 Disorder of kidney and ureter, unspecified: Secondary | ICD-10-CM

## 2022-08-24 DIAGNOSIS — N2889 Other specified disorders of kidney and ureter: Secondary | ICD-10-CM

## 2022-09-17 ENCOUNTER — Other Ambulatory Visit: Payer: Self-pay | Admitting: Physician Assistant

## 2022-09-18 ENCOUNTER — Other Ambulatory Visit: Payer: Self-pay

## 2022-09-19 ENCOUNTER — Ambulatory Visit: Payer: Medicare Other | Admitting: Podiatry

## 2022-09-19 DIAGNOSIS — Z91199 Patient's noncompliance with other medical treatment and regimen due to unspecified reason: Secondary | ICD-10-CM

## 2022-09-19 NOTE — Progress Notes (Signed)
Pt was a no show for apt, charge generated 

## 2022-09-25 ENCOUNTER — Other Ambulatory Visit: Payer: Self-pay

## 2022-09-25 NOTE — Telephone Encounter (Signed)
Looks like per her ov note when prescribed, K. Janee Morn recommends changes or titration of this medication be deferred to PCP.  I would send to PCP for refills as well.

## 2022-09-25 NOTE — Telephone Encounter (Signed)
Pt's pharmacy is requesting a refill on sertraline. Would Cline Crock, PA, like to refill this medication? Please address

## 2022-09-29 DIAGNOSIS — M4640 Discitis, unspecified, site unspecified: Secondary | ICD-10-CM

## 2022-09-29 DIAGNOSIS — M21379 Foot drop, unspecified foot: Secondary | ICD-10-CM

## 2022-09-29 DIAGNOSIS — I5033 Acute on chronic diastolic (congestive) heart failure: Secondary | ICD-10-CM | POA: Insufficient documentation

## 2022-09-29 HISTORY — DX: Foot drop, unspecified foot: M21.379

## 2022-09-29 HISTORY — DX: Discitis, unspecified, site unspecified: M46.40

## 2022-10-09 ENCOUNTER — Ambulatory Visit: Payer: Medicare Other | Admitting: Cardiology

## 2022-10-24 IMAGING — CT CT CTA ABD/PEL W/CM AND/OR W/O CM
2 of 7 series · 15 of 36 positions shown · IV contrast (APPLIED)
Comparison: 06/26/2017 CT angiogram of the chest, abdomen and
pelvis. 05/12/2018 unenhanced CT abdomen/pelvis. 10/25/2018 MRI
abdomen.

CLINICAL DATA: Inpatient. Aortic valve replacement (TAVR), pre-op
eval. Aortic stenosis.

EXAM:
CT ANGIOGRAPHY CHEST, ABDOMEN AND PELVIS
TECHNIQUE: Multidetector CT imaging through the chest, abdomen and pelvis was
performed using the standard protocol during bolus administration of
intravenous contrast. Multiplanar reconstructed images and MIPs were
obtained and reviewed to evaluate the vascular anatomy.
CONTRAST:  100mL OMNIPAQUE IOHEXOL 350 MG/ML SOLN

[Series 6: ax thins · axial · 0.59mm/px · z∈[-671,-22]mm · 14 of 731 slices shown]
[im 41/731  lung]
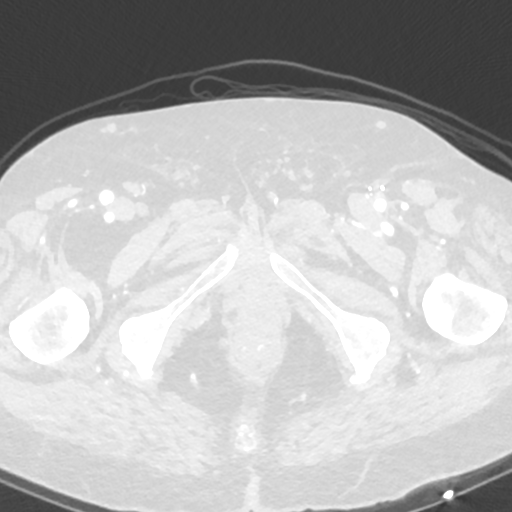
[im 82/731  mediastinal]
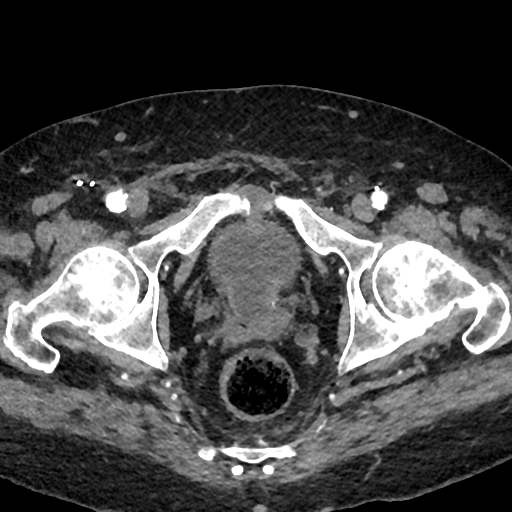
[im 163/731  lung]
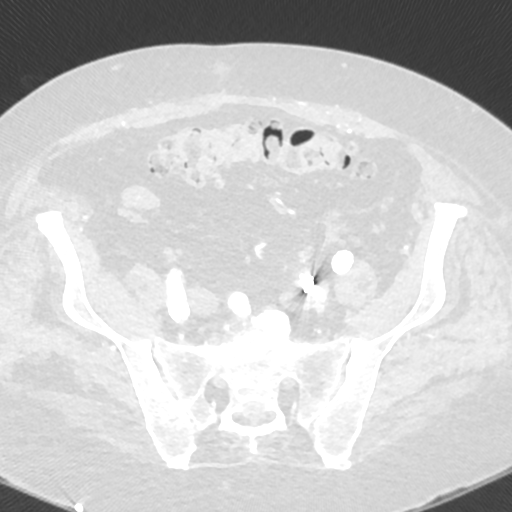
[im 203/731  mediastinal]
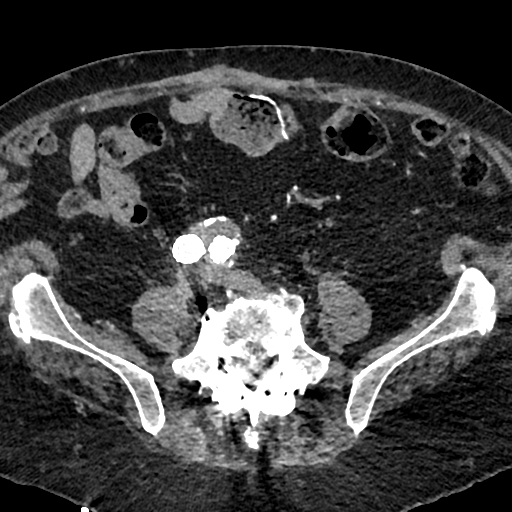
[im 244/731  lung]
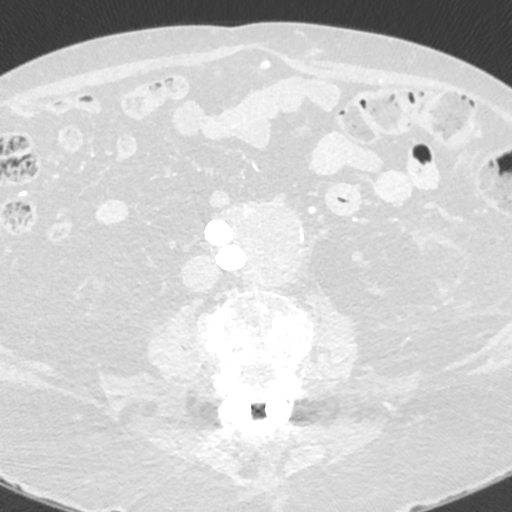
[im 284/731  mediastinal]
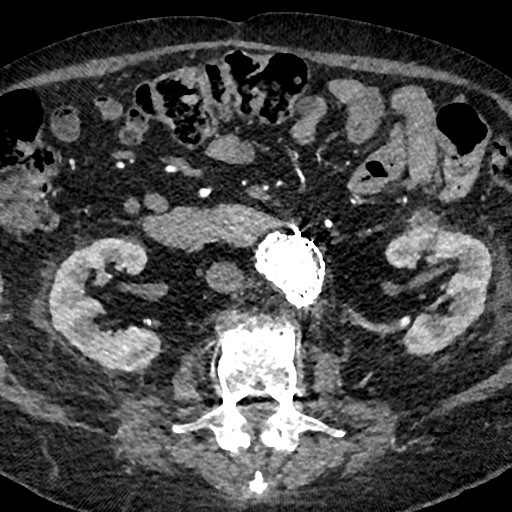
[im 325/731  lung]
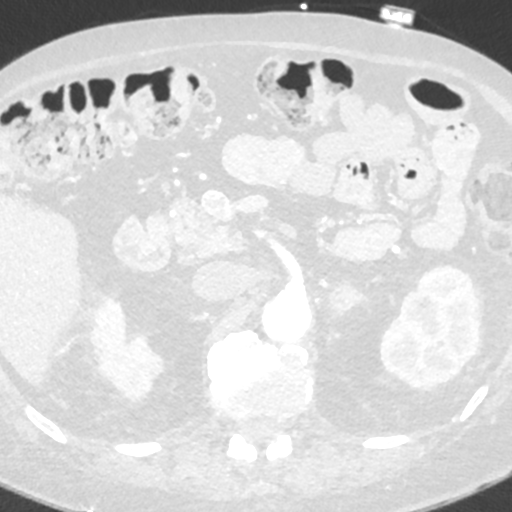
[im 406/731  mediastinal]
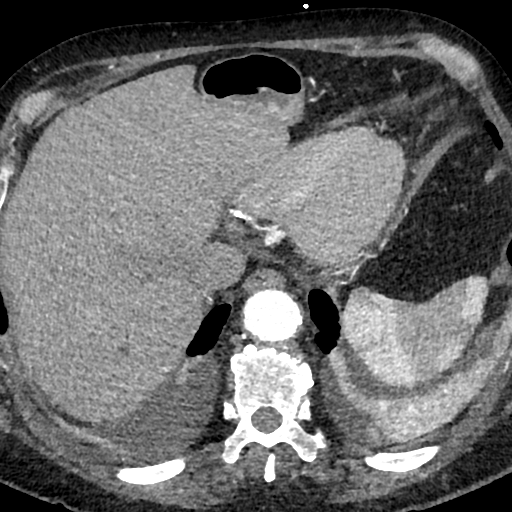
[im 447/731  lung]
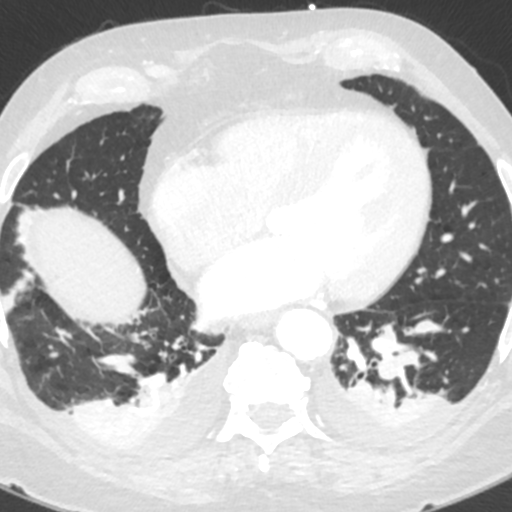
[im 487/731  mediastinal]
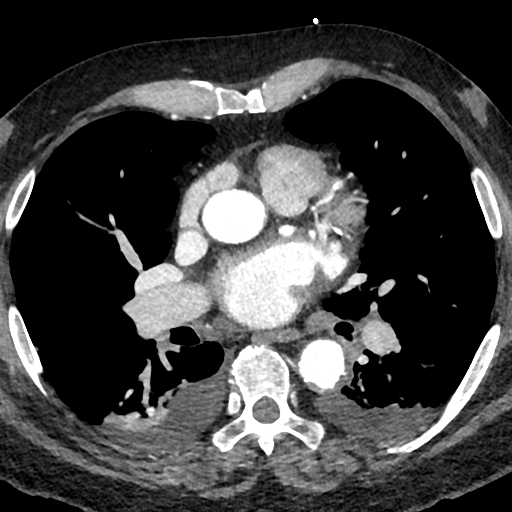
[im 528/731  lung]
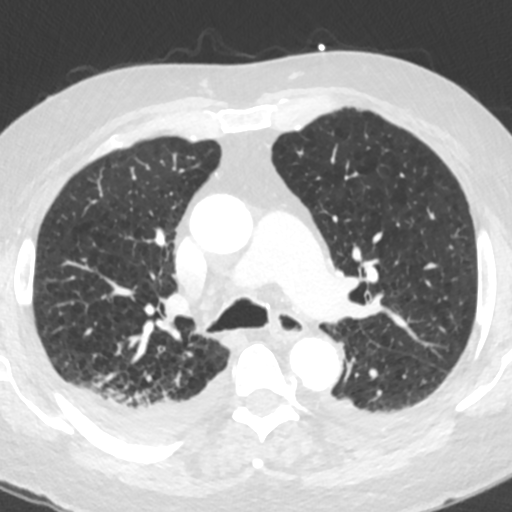
[im 568/731  mediastinal]
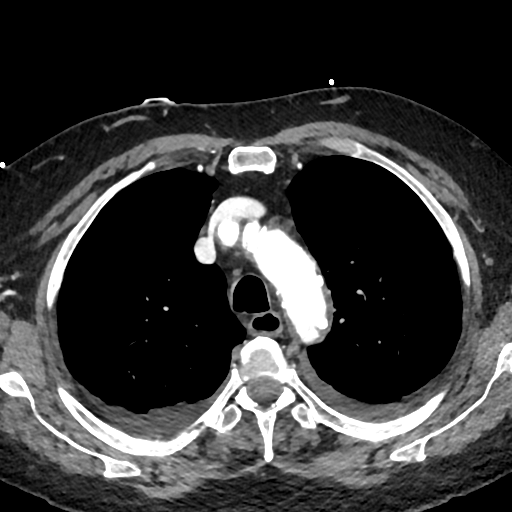
[im 649/731  lung]
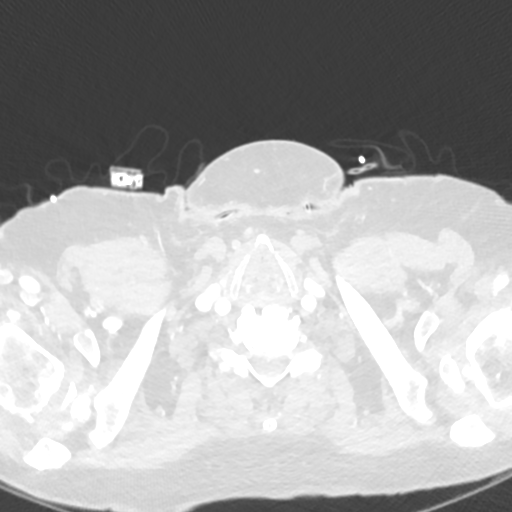
[im 690/731  mediastinal]
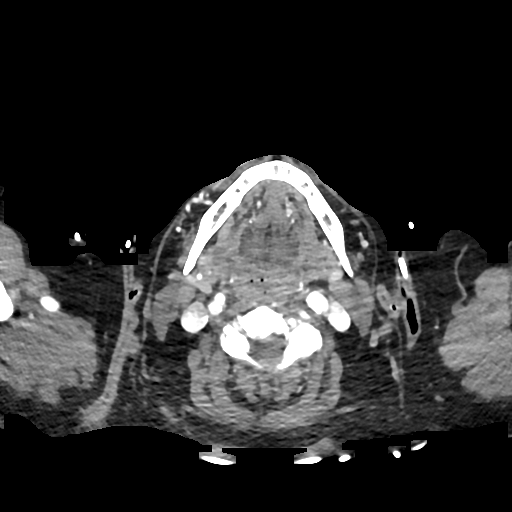

[Series 9: cor · coronal · 0.85mm/px · 1 of 158 slices shown]
[im 79/158  mediastinal]
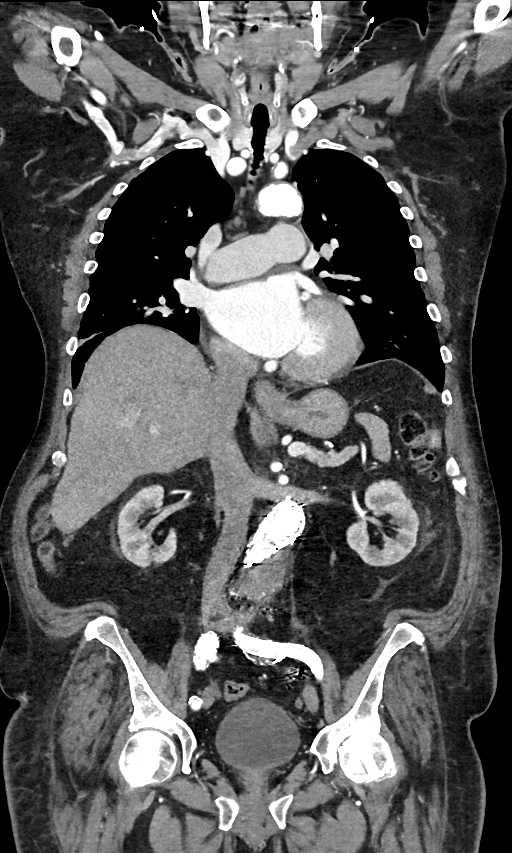

[15 of 36 positions shown; findings below may reference images not displayed]

FINDINGS: CTA CHEST FINDINGS

Cardiovascular: Borderline mild cardiomegaly. No significant
pericardial effusion/thickening. Diffuse thickening and coarse
calcification of the aortic valve. Three-vessel coronary
atherosclerosis. Atherosclerotic nonaneurysmal thoracic aorta.
Top-normal caliber main pulmonary artery (3.1 cm diameter). No
central pulmonary emboli.

Mediastinum/Nodes: No discrete thyroid nodules. Unremarkable
esophagus. No pathologically enlarged axillary, mediastinal or hilar
lymph nodes.

Lungs/Pleura: No pneumothorax. Small dependent bilateral pleural
effusions. Severe centrilobular emphysema with diffuse bronchial
wall thickening. Saber sheath trachea. Right upper lobe 0.3 cm solid
pulmonary nodule (series 8/image 64), stable since 06/26/2017 chest
CT. Mild-to-moderate dependent bilateral lower lobe atelectasis. No
lung masses or additional significant pulmonary nodules in the
aerated portions of the lungs.

Musculoskeletal: No aggressive appearing focal osseous lesions. Mild
thoracic spondylosis. Marked degenerative disc disease throughout
the cervical spine.

CTA ABDOMEN AND PELVIS FINDINGS

Hepatobiliary: Normal liver size. Several scattered subcentimeter
simple liver cysts throughout the liver, not appreciably changed
from 10/25/2018 MRI abdomen. No appreciable new liver lesions.
Cholelithiasis. No biliary ductal dilatation.

Pancreas: Normal, with no mass or duct dilation.

Spleen: Normal size. No mass.

Adrenals/Urinary Tract: Normal right adrenal. Left adrenal 2.3 cm
nodule with density 44 HU, stable since at least 05/12/2018
[address]enhanced CT abdomen study, compatible with an adenoma. Hypodense
exophytic 1.6 cm anterior interpolar left renal cortical lesion
(series 7/image 150), indeterminate, mildly increased from 1.2 cm on
10/25/2018 MRI, not well evaluated on prior MRI due to artifact.
Simple 2.2 cm anterior lower left renal cortical cyst. Additional
scattered subcentimeter hypodense bilateral renal cortical lesions
are too small to characterize. No hydronephrosis. Chronic mild
diffuse bladder wall thickening and trabeculation, similar. Small
superior bladder wall diverticula.

Stomach/Bowel: Normal non-distended stomach. Postsurgical changes
from prior small bowel resection with enteroenterostomy in the mid
abdomen. No unexpected small bowel dilatation. No small bowel wall
thickening. Marked diffuse colonic diverticulosis, most prominent in
the sigmoid colon, with no acute large bowel wall thickening or
pericolonic fat stranding.

Vascular/Lymphatic: Atherosclerotic abdominal aorta with 5.0 cm
infrarenal abdominal aortic aneurysm status post aorto bi-iliac
stent graft repair with patent stent graft. Ectatic 2.8 cm diameter
right common iliac artery, increased from 2.4 cm on 06/26/2017 CT.
No pathologically enlarged lymph nodes in the abdomen or pelvis.

Reproductive: Top-normal size prostate with postsurgical changes
from TURP in the right prostate. Nonspecific coarse internal
prostatic calcifications.

Other: No pneumoperitoneum, ascites or focal fluid collection.

Musculoskeletal: No aggressive appearing focal osseous lesions.
Bilateral posterior spinal fusion hardware L2-L5. Marked
degenerative disc disease throughout the lumbar spine.

VASCULAR MEASUREMENTS PERTINENT TO TAVR:

AORTA:

Minimal Aortic 2iameter-JK.J x 17.1 mm

Severity of Aortic Calcification-severe

RIGHT PELVIS:

Right Common Iliac Artery -

Minimal Qiameter-66.7 x 10.9 mm

Tortuosity-mild

Calcification-moderate (predominantly stented)

Right External Iliac Artery -

Minimal Yiameter-L.6 x 6.2 mm

Tortuosity-severe

Calcification-severe

Right Common Femoral Artery -

Minimal Niameter-5.4 x 8.5 mm

Tortuosity-mild

Calcification-severe

LEFT PELVIS:

Left Common Iliac Artery -

Minimal 0iameter-J.K x 9.1 mm

Tortuosity-mild-to-moderate

Calcification-severe (entirely stented)

Left External Iliac Artery -

Minimal 2iameter-Z.N x 6.6 mm

Tortuosity-mild

Calcification-severe (stented proximally)

Left Common Femoral Artery -

Minimal 4iameter-E.I x 7.8 mm

Tortuosity-mild

Calcification-severe

Review of the MIP images confirms the above findings.
IMPRESSION: 1. Vascular findings and measurements pertinent to potential TAVR
procedure, as detailed.
2. Diffuse thickening and coarse calcification of the aortic valve,
compatible with reported aortic stenosis.
3. Borderline mild cardiomegaly. Three-vessel coronary
atherosclerosis.
4. Small dependent bilateral pleural effusions.
5. Severe centrilobular emphysema with diffuse bronchial wall
thickening and saber sheath trachea, suggesting COPD.
6. Hypodense exophytic 1.6 cm anterior interpolar left renal
cortical lesion, indeterminate for renal neoplasm, mildly increased
from 1.2 cm on 10/25/2018 MRI, not well evaluated on prior MRI due
to artifact. Suggest attention on follow-up renal mass protocol CT
abdomen without and with IV contrast in 3-6 months.
7. Cholelithiasis.
8. Marked diffuse colonic diverticulosis.
9. Stable left adrenal adenoma.
10. Chronic mild diffuse bladder wall thickening and trabeculation
with small superior bladder wall diverticula, probably due to
chronic bladder outlet obstruction.
11. Aortic Atherosclerosis (VNHM3-54F.F) and Emphysema
([id number].F).

## 2022-10-24 IMAGING — CT CT HEART MORP W/ CTA COR W/ SCORE W/ CA W/CM &/OR W/O CM
2 of 7 series · 11 of 20 positions shown, 13 images · non-contrast
Comparison: 06/26/2017 chest CT angiogram.
COMPARISON: 06/26/2017 chest CT angiogram.

Addendum:
EXAM:
OVER-READ INTERPRETATION  CT CHEST

The following report is an over-read performed by radiologist Dr.
Georgio Patrick [[email or web address]] on 06/01/2021. This over-read
does not include interpretation of cardiac or coronary anatomy or
pathology. The coronary CTA interpretation by the cardiologist is
attached.
CLINICAL DATA: Severe Aortic Stenosis.
Cardiac TAVR CT
TECHNIQUE: A non-contrast, gated CT scan was obtained with axial slices of 3 mm
through the heart for aortic valve calcium scoring. A 110 kV
retrospective, gated, contrast cardiac scan was obtained. Gantry
rotation speed was 250 msecs and collimation was 0.6 mm.
Nitroglycerin was not given. The 3D data set was reconstructed in 5%
intervals of the 0-95% of the R-R cycle. Systolic and diastolic
phases were analyzed on a dedicated workstation using MPR, MIP, and
VRT modes. The patient received 100 cc of contrast.

[Series 6: 0-90% · axial · 0.44mm/px · z∈[-278,-141]mm · 5 of 3410 slices shown]
[im 569/3410  vessel]
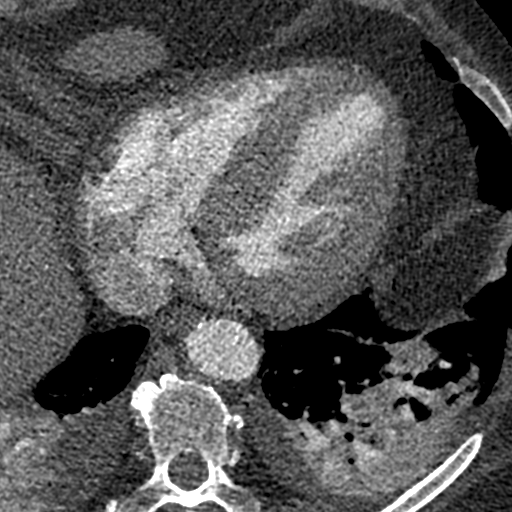
[im 1137/3410  vessel]
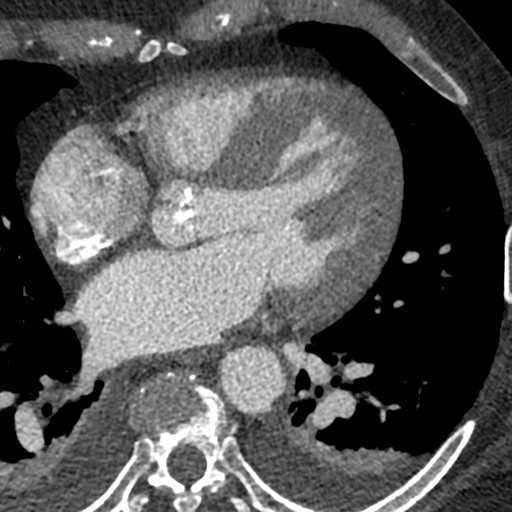
[im 1705/3410  vessel]
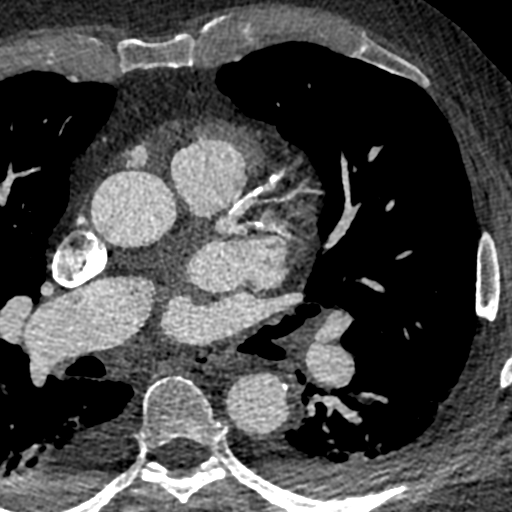
[im 2273/3410  vessel]
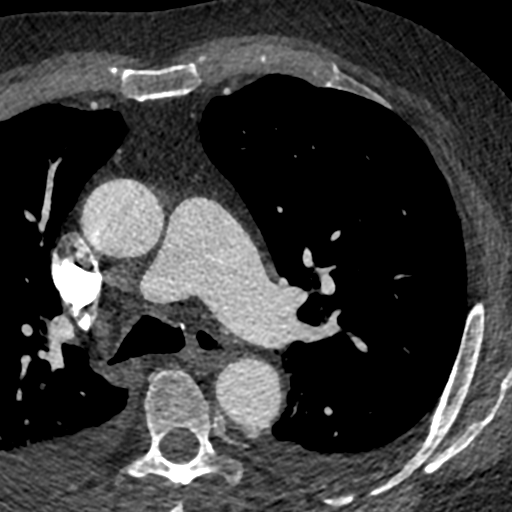
[im 2841/3410  vessel]
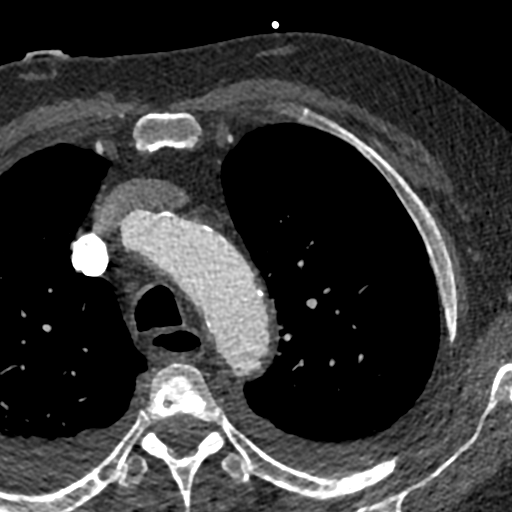

[Series 7: 5-95% · axial · 0.44mm/px · z∈[-283,-136]mm · 6 of 3410 slices shown, 8 images]
[im 488/3410  vessel]
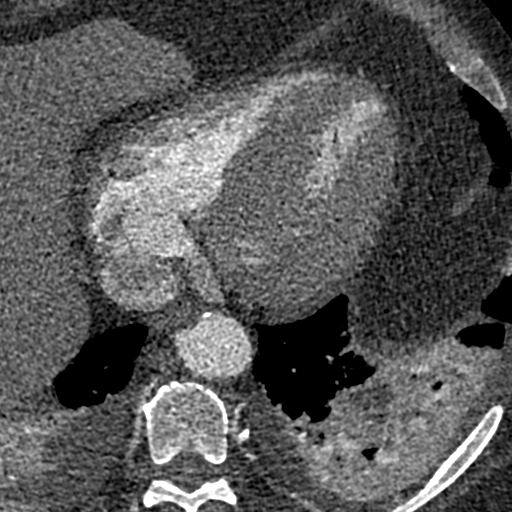
[im 488/3410  lung]
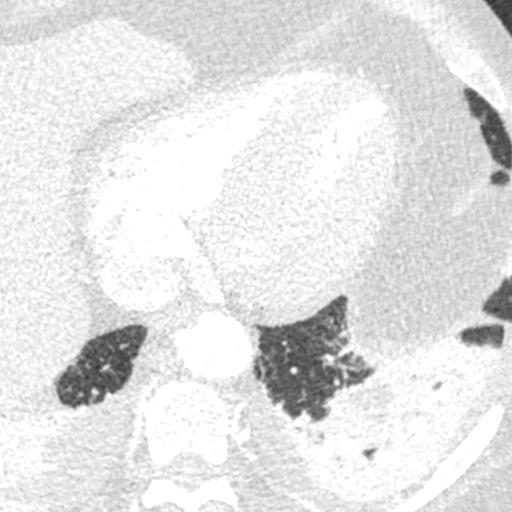
[im 975/3410  vessel]
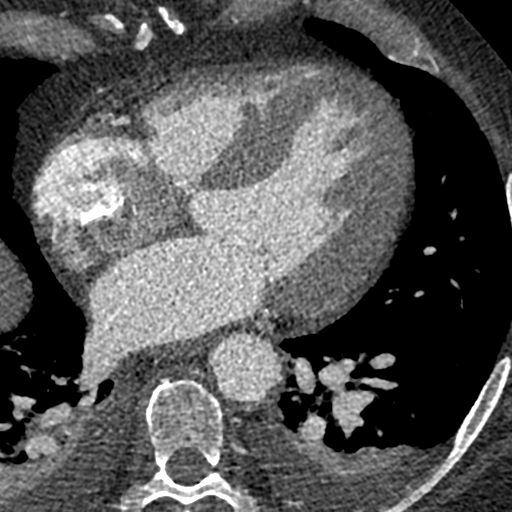
[im 1462/3410  vessel]
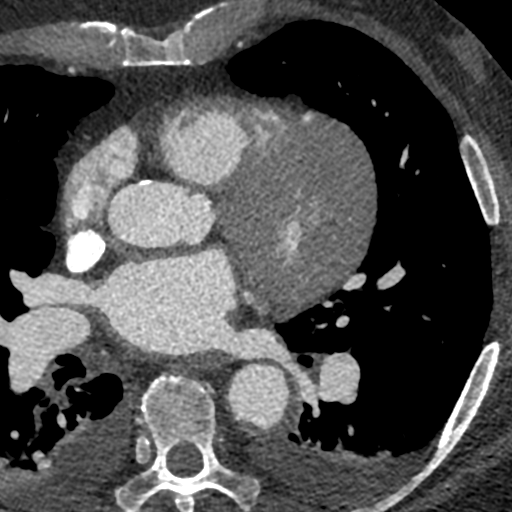
[im 1949/3410  vessel]
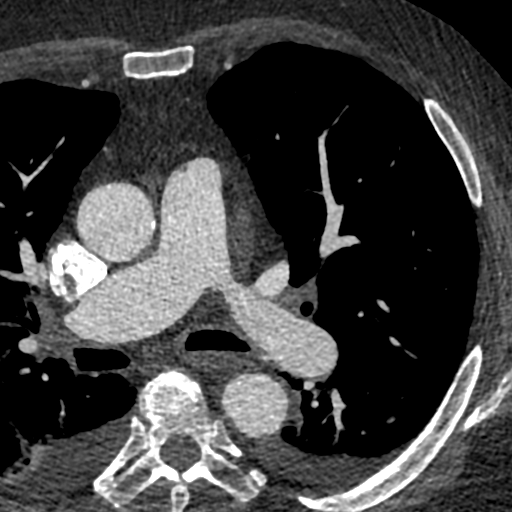
[im 2436/3410  vessel]
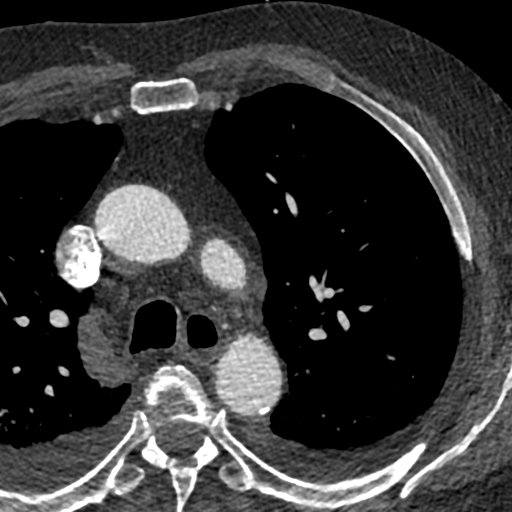
[im 2436/3410  lung]
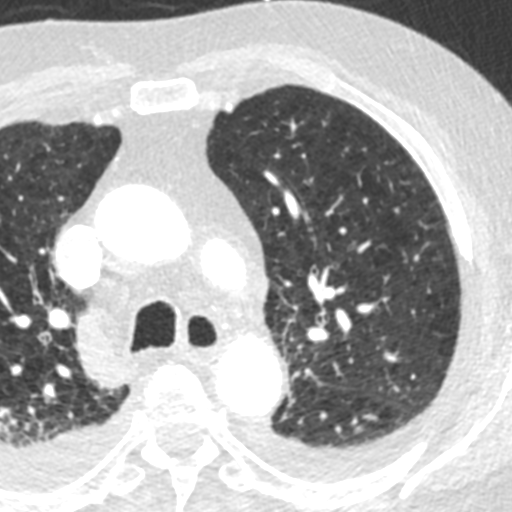
[im 2923/3410  vessel]
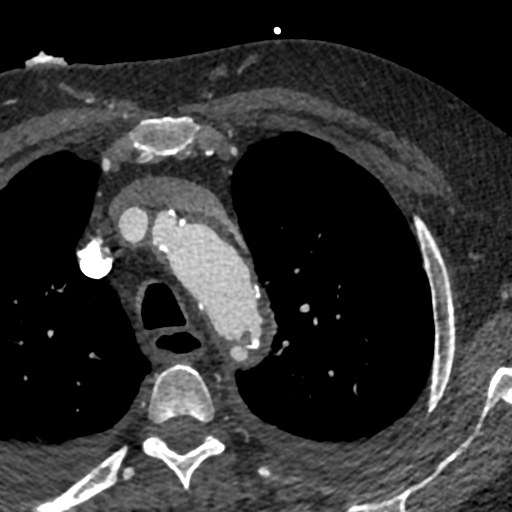

[11 of 20 positions shown; findings below may reference images not displayed]

FINDINGS: Please see the separate concurrent chest CT angiogram report for
details.
IMPRESSION: Please see the separate concurrent chest CT angiogram report for
details.
FINDINGS: Image quality: Poor. The contrast remained in the SVC and the study
had poor contrast opacification.

Noise artifact is: Moderate.

Valve Morphology: Tricuspid aortic valve that is severely calcified.
There is bulky calcification of the NCC.

Aortic Valve Calcium score: 8387

Aortic annular dimension:

Phase assessed: 25%

Annular area: 574 mm2

Annular perimeter: 86.2 mm

Max diameter: 30.1 mm

Min diameter: 24.6 mm

Membranous septum length: 13.4 mm

Annular and subannular calcification: None.

Optimal coplanar projection: RAO 3 MIKO-MARKUS 15

Coronary Artery Height above Annulus:

Left Main: 9.2 mm

Right Coronary: 16.0 mm

Sinus of Valsalva Measurements:

Non-coronary: 33.5 mm

Right-coronary: 33.6 mm

Left-coronary: 34.5 mm

Sinus of Valsalva Height:

Non-coronary: 25.3 mm

Right-coronary: 23.9 mm

Left-coronary: 19.5 mm

Sinotubular Junction: 28 mm

Ascending Thoracic Aorta: 35 mm

Coronary Arteries: Normal coronary origin. Right dominance. The
study was performed without use of NTG and is insufficient for
plaque evaluation. Please refer to recent cardiac catheterization
for coronary assessment. 3-vessel coronary calcifications noted.

Cardiac Morphology:

Right Atrium: Right atrial size is within normal limits. Contrast
reflux into the RA consistent with elevated RA pressure.

Right Ventricle: The right ventricular cavity is within normal
limits.

Left Atrium: Left atrial size is normal in size with no left atrial
appendage filling defect.

Left Ventricle: The ventricular cavity size is within normal limits.
There are no stigmata of prior infarction. There is no abnormal
filling defect. Normal left ventricular function, LVEF=56%. No
regional wall motion abnormalities.

Pulmonary arteries: Dilated suggestive of pulmonary hypertension.

Pulmonary veins: Normal pulmonary venous drainage.

Pericardium: Normal thickness with no significant effusion or
calcium present.

Mitral Valve: The mitral valve is normal structure without
significant calcification.

Extra-cardiac findings: See attached radiology report for
non-cardiac structures.
IMPRESSION: 1. Tricuspid aortic valve with severe aortic stenosis.

2. Annular measurements support a 29 mm S3 TAVR (574 mm2).

3. No significant annular or subannular calcifications.

4. Shallow left main height concerning for increased risk for
coronary obstruction (9.2 mm).

5. Optimal Fluoroscopic Angle for Delivery: RAO 3 MIKO-MARKUS 15

6. Dilated pulmonary artery suggestive of pulmonary hypertension.

*** End of Addendum ***
EXAM:
OVER-READ INTERPRETATION  CT CHEST

The following report is an over-read performed by radiologist Dr.
Georgio Patrick [[email or web address]] on 06/01/2021. This over-read
does not include interpretation of cardiac or coronary anatomy or
pathology. The coronary CTA interpretation by the cardiologist is
attached.
FINDINGS: Please see the separate concurrent chest CT angiogram report for
details.
IMPRESSION: Please see the separate concurrent chest CT angiogram report for
details.

## 2022-10-24 NOTE — Progress Notes (Signed)
 " Cardiology Office Note:    Date:  10/26/2022   ID:  Kyle Lewis, DOB 07-29-1945, MRN [id number]  PCP:  Freddrick Johns   [facility] Providers Cardiologist:  Mayson Sterbenz JONELLE Crape, MD     Referring MD: No ref. provider found   Chief Complaint  Patient presents with   Follow-up    History of Present Illness:    Kyle Lewis is a 77 y.o. male with a hx of hypertension, carotid artery disease known R ICA occlusion and moderate LICA stenosis, right MCA stroke, nonobstructive CAD, severe aortic stenosis s/p TAVR, TAVR endocarditis, MR, AAA s/p repair 2016, chronic diastolic heart failure, OSA on BiPAP, COPD on O2, pulmonary nodule noted on CT imaging in 2023.  In December 2022 he was admitted to the hospital with acute respiratory failure found to have severe aortic stenosis.  Underwent TAVR on 06/07/2021 with Dr. Lucas.  Coronary CTA in July 2023 revealing a calcium  score 1489.  Most recent echo in February 2024 revealed an EF 60 to 65%, moderate concentric LVH, grade 1 DD, moderately dilated LA, trivial MR, AV had been repaired/replaced, mean gradient 13 mmHg.  Most recently he was evaluated by Kate Minus, NP for surveillance of his TAVR.  He was doing well at this time and did not offer any specific complaints however his wife stated that his respiratory status had continued decline.   He presents today accompanied by his wife for follow-up of his CAD.  He states he has been doing well from a cardiac perspective.  He is working with home health PT OT and is almost complete with this.  His wife states he has been steadily improving over the last several months. He is compliant with his BiPAP, and typically wears his O2 with his BiPAP. He rarely needs to take his PRN dose of lasix  since his TAVR, wife gives it to him if he has pedal edema. He denies chest pain, palpitations, dyspnea, pnd, orthopnea, n, v, dizziness, syncope, edema, weight gain, or early satiety.    Past Medical  History:  Diagnosis Date   AAA (abdominal aortic aneurysm) (HCC)    5 cm AAA, 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 05/2015   Abfraction    Abnormal gait 07/31/2019   Abscess of left leg    Accelerated hypertension 12/01/2021   Accretions on teeth    Acute on chronic diastolic heart failure (HCC)    Acute respiratory failure with hypoxemia (HCC) 04/07/2022   Acute respiratory failure with hypoxia (HCC) 12/01/2021   Acute respiratory failure with hypoxia and hypercapnia (HCC) 05/27/2021   HC03   06/14/21    =  31  - 06/23/2021   Walked 250 ft  at a slow pace with a cane. Complained of back and left knee hurting with lowest sat 89% so d/c'd 02    Adenomatous colon polyp    Adrenal mass (HCC)    2 benign appearing left adrenal adenomas noted on 01/13/15 CT   Allergy    seasonal   Anxiety 04/08/2020   Arthritis    Atherosclerosis of aorta (HCC) 04/03/2022   Blister of multiple sites of lower extremity 01/10/2018   Body mass index (BMI) 34.0-34.9, adult 04/07/2020   BPH with obstruction/lower urinary tract symptoms    Overview:  Probable based on symptoms   CAD (coronary artery disease) 02/01/2018   CAP (community acquired pneumonia) 11/30/2021   Caries    Carotid arterial disease (HCC) 05/23/2012   Carotid artery occlusion  Chronic back pain    Chronic diastolic CHF (congestive heart failure) (HCC)    Chronic periodontitis    Cigarette smoker 09/05/2018   COLONIC POLYPS, ADENOMATOUS, HX OF 03/17/2010   Qualifier: Diagnosis of  By: Nelson-Smith CMA (AAMA), Dottie     COPD exacerbation (HCC) 04/06/2022   COPD GOLD II 04/07/2019   Quit smoking 03/2019 - PFT's  04/07/2019  FEV1 1.58 (57 % ) ratio 0.54  p 0 % improvement from saba p nothing prior to study with DLCO  15.22 (66%) corrects to 3.05 (74%)  for alv volume and FV curve concave classically     COPD with acute exacerbation (HCC) 12/01/2021   Degeneration of cervical intervertebral disc    Degeneration of lumbar intervertebral disc 06/14/2015    Depression 03/17/2010   Qualifier: Diagnosis of  By: Nelson-Smith CMA (AAMA), Dottie     Discitis 09/29/2022   Diverticulosis    DIVERTICULOSIS, COLON 03/17/2010   Qualifier: Diagnosis of  By: Marcelo CMA (AAMA), Dottie     Dyspnea    Encounter for dental examination    Epidural abscess 01/26/2022   Excessive dental attrition    Exogenous obesity 11/08/2013   Overview:  With a nine pound weight gain since his last visit   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 harvey and 06/10/2015 last visit   Impairment of balance 10/27/2021   Inflammation of sacroiliac joint (HCC) 05/06/2019   Lower urinary tract symptoms (LUTS) 08/06/2017   Meningioma (HCC)    Microscopic hematuria 11/08/2013   Mitral regurgitation 06/02/2021   Muscle weakness 10/27/2021   Neck pain 03/25/2019   Nocturia    OA (osteoarthritis) of knee 04/01/2012   OAB (overactive bladder) 01/19/2014   Olecranon bursitis of left elbow 11/08/2021   OSA (obstructive sleep apnea)    Bipap per Chodri since ? 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 Verlin & Dr. Lucas   Severe aortic stenosis 02/01/2018   ECHO 01/30/18 - Left ventricle: The cavity size was normal. There was moderate   focal basal hypertrophy of the septum. Systolic function was   vigorous. The estimated ejection fraction was in the range of 65%   to 70%. Wall motion was normal; there were no regional wall   motion  abnormalities. Doppler parameters are consistent with   abnormal left ventricular relaxation (grade 1 diastolic   dysfun   Status post AAA (abdominal aortic aneurysm) repair 02/01/2018   Stroke (HCC) 2013   tia no residual deficit from   Ventral hernia 11/08/2013   Overview:  S/p repair with alloderm mesh and s/p SB resection    Past Surgical History:  Procedure Laterality Date   ABDOMINAL AORTIC ANEURYSM REPAIR  2019   [place]   APPENDECTOMY  as child   BACK SURGERY  2018   Rods and Screws in Back lower back   BRAIN MENINGIOMA EXCISION  1991   menigioma   CARPAL TUNNEL RELEASE     left   EYE SURGERY Bilateral    Cataract   IRRIGATION AND DEBRIDEMENT ABSCESS Left 08/25/2020   Procedure: IRRIGATION AND DEBRIDEMENT ABSCESS LEFT LEG;  Surgeon: Gretel Ozell PARAS, DPM;  Location: [facility];  Service: Podiatry;  Laterality: Left;   JOINT REPLACEMENT Left 04-01-12   Knee   LEFT HEART CATH AND CORONARY ANGIOGRAPHY N/A 09/15/2016   Procedure: Left Heart Cath and Coronary Angiography;  Surgeon: Lonni Hanson, MD;  Location: [facility] INVASIVE CV LAB;  Service: Cardiovascular;  Laterality: N/A;   MAXIMUM ACCESS (MAS)POSTERIOR LUMBAR INTERBODY FUSION (PLIF) 1 LEVEL N/A 07/07/2015   Procedure:  POSTERIOR LUMBAR INTERBODY FUSION (PLIF) Lumbar Four-Five with Pedicle Screw Fixation Lumbar Two-Five;Laminectomy Lumbar Two-Five;  Surgeon:  Alm GORMAN Molt, MD;  Location: [facility] NEURO ORS;  Service: Neurosurgery;  Laterality: N/A;   POSTERIOR LUMBAR INTERBODY FUSION (PLIF) Lumbar Four-Five with Pedicle Screw Fixation Lumbar Two-Five;Laminectomy Lumbar Two-Five   RIGHT/LEFT HEART CATH AND CORONARY ANGIOGRAPHY N/A 01/24/2018   Procedure: RIGHT/LEFT HEART CATH AND CORONARY ANGIOGRAPHY;  Surgeon: Hanson Lonni, MD;  Location: [facility] INVASIVE CV LAB;  Service: Cardiovascular;  Laterality: N/A;   RIGHT/LEFT HEART CATH AND CORONARY ANGIOGRAPHY N/A 05/31/2021   Procedure: RIGHT/LEFT HEART CATH AND CORONARY ANGIOGRAPHY;  Surgeon: Verlin Lonni BIRCH, MD;  Location: [facility] INVASIVE CV LAB;  Service: Cardiovascular;  Laterality: N/A;   TEE WITHOUT CARDIOVERSION N/A 06/07/2021   Procedure: TRANSESOPHAGEAL ECHOCARDIOGRAM (TEE);  Surgeon: Verlin Lonni BIRCH, MD;  Location[facility];  Service: Open Heart Surgery;  Laterality: N/A;   TEE WITHOUT CARDIOVERSION N/A 12/08/2021   Procedure: TRANSESOPHAGEAL ECHOCARDIOGRAM (TEE);  Surgeon: Sheena Pugh, DO;  Location: [facility] ENDOSCOPY;  Service: Cardiovascular;  Laterality: N/A;   TONSILLECTOMY  as child   TOTAL KNEE ARTHROPLASTY  04/01/2012   Procedure: TOTAL KNEE ARTHROPLASTY;  Surgeon: Dempsey LULLA Moan, MD;  Location: [facility] ORS;  Service: Orthopedics;  Laterality: Left;   TRANSCATHETER AORTIC VALVE REPLACEMENT, TRANSFEMORAL Bilateral 06/07/2021   Procedure: TRANSCATHETER AORTIC VALVE REPLACEMENT, RIGHT TRANSFEMORAL;  Surgeon: Verlin Lonni BIRCH, MD;  Location: [facility] OR;  Service: Open Heart Surgery;  Laterality: Bilateral;   TRANSURETHRAL RESECTION OF PROSTATE     ULTRASOUND GUIDANCE FOR VASCULAR ACCESS Bilateral 06/07/2021   Procedure: ULTRASOUND GUIDANCE FOR VASCULAR ACCESS;  Surgeon: Verlin Lonni BIRCH, MD;  Location[facility];  Service: Open Heart Surgery;  Laterality: Bilateral;   UMBILICAL HERNIA REPAIR  2011    Current Medications: Current Meds  Medication Sig   acetaminophen  (TYLENOL ) 500 MG tablet Take  1,000 mg by mouth every 6 (six) hours as needed for moderate pain.   amLODipine  (NORVASC ) 10 MG tablet Take 1 tablet (10 mg total) by mouth daily.   amoxicillin  (AMOXIL ) 500 MG capsule Take 1 capsule (500 mg total) by mouth 3 (three) times daily.   finasteride  (  PROSCAR ) 5 MG tablet Take 1 tablet (5 mg total) by mouth daily.   furosemide  (LASIX ) 20 MG tablet Take 1-2 tablets (20-40 mg total) by mouth as needed for fluid or edema.   metoprolol  tartrate (LOPRESSOR ) 25 MG tablet Take 1 tablet (25 mg total) by mouth 2 (two) times daily.   oxyCODONE  (OXY IR/ROXICODONE ) 5 MG immediate release tablet Take 1 tablet (5 mg total) by mouth every 6 (six) hours as needed for severe pain.   sertraline  (ZOLOFT ) 50 MG tablet Take 1 tablet (50 mg total) by mouth daily.   tamsulosin  (FLOMAX ) 0.4 MG CAPS capsule TAKE 1 CAPSULE BY MOUTH ONCE DAILY AFTER SUPPER     Allergies:   Cefazolin    Social History   Socioeconomic History   Marital status: Married    Spouse name: Shona    Number of children: 2   Years of education: College    Highest education level: Not on file  Occupational History    Comment: Self employed Naval Architect retired  Tobacco Use   Smoking status: Former    Packs/day: 0.75    Years: 40.00    Additional pack years: 0.00    Total pack years: 30.00    Types: Cigarettes    Quit date: 05/05/2021    Years since quitting: 1.4   Smokeless tobacco: Never   Tobacco comments:    1/2 pack a day if that   Vaping Use   Vaping Use: Never used  Substance and Sexual Activity   Alcohol use: Yes    Comment: rare   Drug use: No   Sexual activity: Not Currently  Other Topics Concern   Not on file  Social History Narrative   Patient lives at home with his wife. Rhonda    Patient has a naval architect.    Patient has 2 sons.    Patient smokes a half pack a day.          Social Determinants of Health   Financial Resource Strain: Low Risk  (02/13/2022)   Overall Financial Resource Strain  (CARDIA)    Difficulty of Paying Living Expenses: Not very hard  Food Insecurity: No Food Insecurity (04/17/2022)   Hunger Vital Sign    Worried About Running Out of Food in the Last Year: Never true    Ran Out of Food in the Last Year: Never true  Transportation Needs: No Transportation Needs (04/17/2022)   PRAPARE - Administrator, Civil Service (Medical): No    Lack of Transportation (Non-Medical): No  Physical Activity: Not on file  Stress: Not on file  Social Connections: Not on file     Family History: The patient's family history includes Arthritis in his father and mother; Dementia (age of onset: 44) in his father; Diabetes in his father; Heart attack in his father; Heart disease in his father and mother; Hyperlipidemia in his mother; Hypertension in his mother; Prostate cancer in his maternal grandfather. There is no history of Colon cancer, Esophageal cancer, Rectal cancer, or Stomach cancer.  ROS:   Please see the history of present illness.     All other systems reviewed and are negative.  EKGs/Labs/Other Studies Reviewed:    The following studies were reviewed today: Cardiac Studies & Procedures   CARDIAC CATHETERIZATION  CARDIAC CATHETERIZATION 05/31/2021  Narrative   Prox LAD lesion is 25% stenosed.   Dist Cx lesion is 40% stenosed.   Ost 2nd Mrg to 2nd Mrg lesion is 70% stenosed.  Dist RCA lesion is 60% stenosed.   Ost RPDA to RPDA lesion is 40% stenosed.  The LAD is a large caliber vessel that courses to the apex. There is mild mid LAD stenosis The Circumflex is a large caliber vessel that gives off a small obtuse marginal branch then 2 moderate caliber obtuse marginal branches. The second obtuse marginal branch has a moderate stenosis, unchanged from last cath in 2019. The RCA is a large dominant artery. The distal RCA has a moderate stenosis, unchanged from last cath in 2019. Severe aortic stenosis (mean gradient 50.6 mmHg, 59 mmHg, AVA 0.93  cm2).  Recommendations: Will continue workup for TAVR. His right and left heart pressures are only mildly abnormal following inpatient diuresis. Continue diuresis. The structural heart team will provide a formal consultation regarding his candidacy for TAVR.  Findings Coronary Findings Diagnostic  Dominance: Right  Left Main Vessel is large. Vessel is angiographically normal.  Left Anterior Descending The vessel exhibits minimal luminal irregularities. Prox LAD lesion is 25% stenosed.  First Diagonal Branch Vessel is moderate in size.  Second Diagonal Branch Vessel is small in size.  Third Diagonal Branch Vessel is small in size.  Ramus Intermedius Vessel is moderate in size. Vessel is angiographically normal.  Left Circumflex Vessel is large. Dist Cx lesion is 40% stenosed. The lesion is irregular.  First Obtuse Marginal Branch Vessel is small in size.  Second Obtuse Marginal Branch Vessel is moderate in size. Ost 2nd Mrg to 2nd Mrg lesion is 70% stenosed. The lesion is focal and eccentric.  Third Obtuse Marginal Branch Vessel is small in size. Vessel is angiographically normal.  Right Coronary Artery Vessel is large. The vessel is moderately tortuous. Dist RCA lesion is 60% stenosed.  Right Posterior Descending Artery Vessel is moderate in size. Ost RPDA to RPDA lesion is 40% stenosed.  Right Posterior Atrioventricular Artery Vessel is small in size.  Intervention  No interventions have been documented.   CARDIAC CATHETERIZATION  CARDIAC CATHETERIZATION 01/24/2018  Narrative Conclusions: 1. Stable appearance of coronary arteries since 09/2016.  Moderate to severe disease involving the distal RCA/rPDA and OM2 is not significantly changed and involves relatively small vessels.  Recent troponin elevation in [place]  was likely due to supply-demand mismatch in the setting of heat stroke/hyperthermia, moderate aortic stenosis, and moderate to severe CAD  involving small branches. 2. Upper normal to mildly elevated left and right heart filling pressures. 3. Normal Fick cardiac output/index. 4. Moderate aortic stenosis.  Recommendations: 1. Continue medical therapy.  Given minimal symptoms and stable disease since prior catheterization in 09/2016, I do not recommend PCI to small to moderate sized OM2 and rPDA branches at this time. 2. Obtain outpatient echo to reassess aortic valve, given discrepant findings between recent outside echo (severe AS) and today's catheterization. 3. Patient wishes to follow-up with Dr. Edwyna in [place]; we will arrange for appointment in ~2 weeks.  If his symptoms remain stable or improve and echo confirms non-critical aortic stenosis, I think it is reasonable for Mr. Hiott to begin driving again.  Recommend continuation of clopidogrel  75 mg daily for moderate CAD and history of AAA status post endovascular repair.  Lonni Hanson, MD [facility] Pager: [phone]  Findings Coronary Findings Diagnostic  Dominance: Right  Left Main Vessel is large. Vessel is angiographically normal.  Left Anterior Descending The vessel exhibits minimal luminal irregularities. Prox LAD lesion is 25% stenosed.  First Diagonal Branch Vessel is moderate in size.  Second Diagonal Branch Vessel is  small in size.  Third Diagonal Branch Vessel is small in size.  Ramus Intermedius Vessel is moderate in size. Vessel is angiographically normal.  Left Circumflex Vessel is large. Dist Cx lesion is 40% stenosed. The lesion is irregular.  First Obtuse Marginal Branch Vessel is small in size.  Second Obtuse Marginal Branch Vessel is moderate in size. 2nd Mrg lesion is 70% stenosed. The lesion is focal and eccentric.  Third Obtuse Marginal Branch Vessel is small in size. The vessel exhibits minimal luminal irregularities.  Right Coronary Artery Vessel is large. The vessel is moderately tortuous. Dist RCA  lesion is 50% stenosed.  Right Posterior Descending Artery Vessel is moderate in size. RPDA lesion is 60% stenosed. The lesion is discrete.  Right Posterior Atrioventricular Artery Vessel is small in size.  Intervention  No interventions have been documented.   STRESS TESTS  MYOCARDIAL PERFUSION IMAGING 09/04/2016  Narrative  Nuclear stress EF: 56%.  No T wave inversion was noted during stress.  There was no ST segment deviation noted during stress.  Defect 1: There is a medium defect of moderate severity.  Findings consistent with ischemia.  This is an intermediate risk study.  Large size, moderate intensity partially reversible (SDS 4) inferior, inferoseptal, anteroseptal and inferolateral perfusion defect, suggestive of ischemia or less likely attenuation artifact. There is underlying bowel which attenuates. LVEF 56% with normal wall motion. Clinical correlation is advised. This is an intermediate risk study.   ECHOCARDIOGRAM  ECHOCARDIOGRAM COMPLETE 08/02/2022  Narrative ECHOCARDIOGRAM REPORT    Patient Name:   RONTAE INGLETT Date of Exam: 08/02/2022 Medical Rec #:  [id number]       Height:       66.0 in Accession #:    [id number]      Weight:       220.0 lb Date of Birth:  [birth year]      BSA:          2.082 m Patient Age:    76 years        BP:           158/89 mmHg Patient Gender: M               HR:           64 bpm. Exam Location:  [address]  Procedure: 2D Echo, Cardiac Doppler and Color Doppler  Indications:    Z95 S/p TAVR  History:        Patient has prior history of Echocardiogram examinations, most recent 02/03/2022. CAD, Stroke, Carotid Disease and AAA, Signs/Symptoms:Murmur; Risk Factors:Hypertension, HLD and Dyslipidemia.  Sonographer:    Waldo Guadalajara RCS Referring Phys: [id number] JILL D MCDANIEL  IMPRESSIONS   1. Left ventricular ejection fraction, by estimation, is 60 to 65%. The left ventricle has normal function. The left ventricle has  no regional wall motion abnormalities. There is moderate concentric left ventricular hypertrophy. Left ventricular diastolic parameters are consistent with Grade I diastolic dysfunction (impaired relaxation). 2. Right ventricular systolic function is normal. The right ventricular size is normal. There is normal pulmonary artery systolic pressure. 3. Left atrial size was moderately dilated. 4. The mitral valve is normal in structure. Trivial mitral valve regurgitation. No evidence of mitral stenosis. 5. The aortic valve has been repaired/replaced. Aortic valve regurgitation is not visualized. No aortic stenosis is present. Aortic valve mean gradient measures 13.0 mmHg. Aortic valve Vmax measures 2.41 m/s. 6. The inferior vena cava is normal in size with greater than 50% respiratory variability,  suggesting right atrial pressure of 3 mmHg.  FINDINGS Left Ventricle: Left ventricular ejection fraction, by estimation, is 60 to 65%. The left ventricle has normal function. The left ventricle has no regional wall motion abnormalities. The left ventricular internal cavity size was normal in size. There is moderate concentric left ventricular hypertrophy. Left ventricular diastolic parameters are consistent with Grade I diastolic dysfunction (impaired relaxation).  Right Ventricle: The right ventricular size is normal. No increase in right ventricular wall thickness. Right ventricular systolic function is normal. There is normal pulmonary artery systolic pressure. The tricuspid regurgitant velocity is 2.33 m/s, and with an assumed right atrial pressure of 3 mmHg, the estimated right ventricular systolic pressure is 24.7 mmHg.  Left Atrium: Left atrial size was moderately dilated.  Right Atrium: Right atrial size was normal in size.  Pericardium: There is no evidence of pericardial effusion.  Mitral Valve: The mitral valve is normal in structure. There is mild thickening of the mitral valve leaflet(s).  Trivial mitral valve regurgitation. No evidence of mitral valve stenosis.  Tricuspid Valve: The tricuspid valve is normal in structure. Tricuspid valve regurgitation is trivial. No evidence of tricuspid stenosis.  Aortic Valve: The aortic valve has been repaired/replaced. Aortic valve regurgitation is not visualized. No aortic stenosis is present. Aortic valve mean gradient measures 13.0 mmHg. Aortic valve peak gradient measures 23.2 mmHg. Aortic valve area, by VTI measures 1.63 cm. 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  Pulmonic Valve: The pulmonic valve was not well visualized. Pulmonic valve regurgitation is not visualized. No evidence of pulmonic stenosis.  Aorta: The aortic root is normal in size and structure.  Venous: The inferior vena cava is normal in size with greater than 50% respiratory variability, suggesting right atrial pressure of 3 mmHg.  IAS/Shunts: No atrial level shunt detected by color flow Doppler.   LEFT VENTRICLE PLAX 2D LVIDd:         4.20 cm   Diastology LVIDs:         3.00 cm   LV e' medial:    6.96 cm/s LV PW:         1.40 cm   LV E/e' medial:  14.2 LV IVS:        1.40 cm   LV e' lateral:   8.49 cm/s LVOT diam:     2.10 cm   LV E/e' lateral: 11.7 LV SV:         94 LV SV Index:   45 LVOT Area:     3.46 cm   RIGHT VENTRICLE RV Basal diam:  3.30 cm RV S prime:     17.00 cm/s TAPSE (M-mode): 2.3 cm  LEFT ATRIUM             Index        RIGHT ATRIUM           Index LA diam:        4.50 cm 2.16 cm/m   RA Area:     20.50 cm LA Vol (A2C):   45.5 ml 21.85 ml/m  RA Volume:   56.00 ml  26.89 ml/m LA Vol (A4C):   44.2 ml 21.22 ml/m LA Biplane Vol: 45.0 ml 21.61 ml/m AORTIC VALVE AV Area (Vmax):    1.55 cm AV Area (Vmean):   1.37 cm AV Area (VTI):     1.63 cm AV Vmax:           241.00 cm/s AV Vmean:  175.000 cm/s AV VTI:            0.572 m AV Peak Grad:      23.2 mmHg AV Mean Grad:       13.0 mmHg LVOT Vmax:         108.00 cm/s LVOT Vmean:        69.200 cm/s LVOT VTI:          0.270 m LVOT/AV VTI ratio: 0.47  AORTA Ao Root diam: 2.80 cm Ao Asc diam:  2.60 cm  MITRAL VALVE                TRICUSPID VALVE MV Area (PHT):              TR Peak grad:   21.7 mmHg MV Decel Time:              TR Vmax:        233.00 cm/s MV E velocity: 99.00 cm/s   TR Vmean:       300.0 cm/s MV A velocity: 122.00 cm/s MV E/A ratio:  0.81         SHUNTS Systemic VTI:  0.27 m Systemic Diam: 2.10 cm  Toribio Fuel MD Electronically signed by Toribio Fuel MD Signature Date/Time: 08/02/2022/4:32:36 PM    Final   TEE  ECHO TEE 12/08/2021  Narrative TRANSESOPHOGEAL ECHO REPORT    Patient Name:   JUSTINE COSSIN Date of Exam: 12/08/2021 Medical Rec #:  [id number]       Height:       68.0 in Accession #:    [id number]      Weight:       218.9 lb Date of Birth:  July 24, 1945      BSA:          2.124 m Patient Age:    75 years        BP:           91/42 mmHg Patient Gender: M               HR:           66 bpm. Exam Location:  Inpatient  Procedure: Transesophageal Echo, Color Doppler, 3D Echo and Cardiac Doppler  Indications:     bacteremia  History:         Patient has prior history of Echocardiogram examinations, most recent 12/06/2021. COPD; Risk Factors:Hypertension, Dyslipidemia, Former Smoker and Sleep Apnea. Aortic Valve: 29 mm Edwards TAVR valve is present in the aortic position.  Sonographer:     Tinnie Barefoot RDCS Referring Phys:  [id number] SHIRL FRUITS Diagnosing Phys: Kardie Tobb DO  PROCEDURE: After discussion of the risks and benefits of a TEE, an informed consent was obtained from the patient. The transesophogeal probe was passed without difficulty through the esophogus of the patient. Imaged were obtained with the patient in a left lateral decubitus position. Local oropharyngeal anesthetic was provided with viscous lidocaine . Sedation performed by different  physician. Image quality was adequate. The patient developed no complications during the procedure.  IMPRESSIONS   1. There is a highly mobile mass on the base of the left coronary cusp highly suspicious for a vegetation. No appreciation of an abcess. Consider cardiac CTA if need for further evaluation. The aortic valve has been repaired/replaced. Aortic valve regurgitation is trivial. No aortic stenosis is present. There is a 29 mm Edwards TAVR valve present in the aortic position. 2. Left ventricular ejection fraction, by estimation, is 60 to  65%. The left ventricle has normal function. 3. Right ventricular systolic function is normal. The right ventricular size is normal. 4. No left atrial/left atrial appendage thrombus was detected. The LAA emptying velocity was 59 cm/s. 5. The mitral valve is normal in structure. Mild mitral valve regurgitation. 6. Tricuspid leaflets appears to be mildly thickened. 7. There is mild (Grade II) layered plaque. 8. Mildly dilated pulmonary artery.  Conclusion(s)/Recommendation(s): Findings concerning for aortic valve vegetation. Consider cardiac cta for further clarification.  FINDINGS Left Ventricle: Left ventricular ejection fraction, by estimation, is 60 to 65%. The left ventricle has normal function. The left ventricular internal cavity size was normal in size.  Right Ventricle: The right ventricular size is normal. No increase in right ventricular wall thickness. Right ventricular systolic function is normal.  Left Atrium: Left atrial size was not well visualized. No left atrial/left atrial appendage thrombus was detected. The LAA emptying velocity was 59 cm/s. Right Atrium: Right atrial size was not well visualized.  Mitral Valve: The mitral valve is normal in structure. Mild mitral valve regurgitation.  Tricuspid Valve: Tricuspid leaflets appears to be mildly thickened. Tricuspid valve regurgitation is mild . No evidence of tricuspid  stenosis.  Aortic Valve: There is a highly mobile mass on the base of the left coronary cusp highly suspicious for a vegetation. No appreciation of an abcess. Consider cardiac CTA if need for further evaluation. The aortic valve has been repaired/replaced. Aortic valve regurgitation is trivial. No aortic stenosis is present. Aortic valve mean gradient measures 12.0 mmHg. Aortic valve peak gradient measures 23.4 mmHg. Aortic valve area, by VTI measures 1.75 cm. There is a 29 mm Edwards TAVR valve present in the aortic position.  Pulmonic Valve: The pulmonic valve was normal in structure. Pulmonic valve regurgitation is not visualized.  Aorta: The aortic root and ascending aorta are structurally normal, with no evidence of dilitation. There is mild (Grade II) layered plaque.  Pulmonary Artery: The pulmonary artery is mildly dilated.  Venous: The left upper pulmonary vein, left lower pulmonary vein, right upper pulmonary vein and right lower pulmonary vein are normal. A normal flow pattern is recorded from the left upper pulmonary vein.  IAS/Shunts: No atrial level shunt detected by color flow Doppler.   LEFT VENTRICLE PLAX 2D LVOT diam:     2.10 cm LV SV:         76 LV SV Index:   36 LVOT Area:     3.46 cm   AORTIC VALVE AV Area (Vmax):    1.50 cm AV Area (Vmean):   1.24 cm AV Area (VTI):     1.75 cm AV Vmax:           242.00 cm/s AV Vmean:          157.000 cm/s AV VTI:            0.431 m AV Peak Grad:      23.4 mmHg AV Mean Grad:      12.0 mmHg LVOT Vmax:         105.00 cm/s LVOT Vmean:        56.400 cm/s LVOT VTI:          0.218 m LVOT/AV VTI ratio: 0.51   SHUNTS Systemic VTI:  0.22 m Systemic Diam: 2.10 cm  Kardie Tobb DO Electronically signed by Dub Huntsman DO Signature Date/Time: 12/08/2021/3:22:09 PM    Final    CT SCANS  CT CORONARY MORPH W/CTA COR W/SCORE 12/13/2021  Addendum 12/13/2021 10:21  AM ADDENDUM REPORT: 12/13/2021 10:19  CLINICAL DATA:  89M  with AS s/p TAVR and echo concerning for endocarditis. Evaluate for aortic abscess  EXAM: Cardiac/Coronary CTA  TECHNIQUE: The patient was scanned on a [employer].  FINDINGS: A 100 kV retrospective scan was triggered in the descending thoracic aorta at 111 HU's. Axial non-contrast 3 mm slices were carried out through the heart. The data set was analyzed on a dedicated work station and scored using the Agatson method. Gantry rotation speed was 250 msecs and collimation was .6 mm. The 3D data set was reconstructed in 5% intervals of the R-R cycle. Phases were analyzed on a dedicated work station using MPR, MIP and VRT modes. The patient received 100 cc of contrast.  Aortic Valve: S/p TAVR with 29mm Sapien 3 valve. No aortic abscess seen.  Coronary Arteries: Normal coronary origin. Right dominance. Study was without use of NTG ans is not sufficient to evaluate coronary artery stenosis. Calcium  score 1489 (85th percentile)  Left Ventricle: Mild dilatation  Left Atrium: Moderate dilatation  Pulmonary Veins: Normal configuration  Right Ventricle: Mild dilatation  Right Atrium: Mild dilatation  Thoracic aorta: Normal size  Pulmonary Arteries: Dilated main PA measuring 30mm  Systemic Veins: Normal drainage  Pericardium: Normal thickness  IMPRESSION: 1. No aortic abscess seen  2. S/p TAVR with 29mm Sapien 3 valve  3. Coronary calcium  score 1489 (85th percentile)   Electronically Signed By: Lonni Nanas M.D. On: 12/13/2021 10:19  Narrative EXAM: OVER-READ INTERPRETATION  CT CHEST  The following report is a limited chest CT over-read performed by radiologist Dr. Rea Marc of [facility] on 12/13/2021. This over-read does not include interpretation of cardiac or coronary anatomy or pathology. The cardiac CTA interpretation by the cardiologist is attached.  COMPARISON:  Heart CT dated June 01, 2021  FINDINGS: Vascular:  Normal caliber thoracic aorta with severe calcified and noncalcified plaque. Prior transcatheter aortic valve replacement. Normal heart size. No pericardial effusion. No suspicious filling defects of the main pulmonary arteries.  Mediastinum/Nodes: Esophagus is unremarkable. No pathologically enlarged lymph nodes seen in the chest.  Lungs/Pleura: Central airways are patent. Moderate centrilobular emphysema. Small solid pulmonary nodule of the right middle lobe measuring 4 mm on series 10, image 76, new when compared with June 01, 2021 prior. Trace left pleural effusion and bibasilar atelectasis.  Upper Abdomen: No acute abnormality.  Musculoskeletal: No chest wall mass or suspicious bone lesions identified.  IMPRESSION: 1. No acute extracardiac abnormality. 2. Small solid pulmonary nodule of the right middle lobe measuring 4 mm, which is new when compared with June 01, 2021 prior. Recommend follow-up chest CT in 6 months.  Electronically Signed: By: Rea Marc M.D. On: 12/13/2021 08:43   CT SCANS  CT CORONARY MORPH W/CTA COR W/SCORE 06/01/2021  Addendum 06/01/2021 10:14 PM ADDENDUM REPORT: 06/01/2021 22:12  CLINICAL DATA:  Severe Aortic Stenosis.  EXAM: Cardiac TAVR CT  TECHNIQUE: A non-contrast, gated CT scan was obtained with axial slices of 3 mm through the heart for aortic valve calcium  scoring. A 110 kV retrospective, gated, contrast cardiac scan was obtained. Gantry rotation speed was 250 msecs and collimation was 0.6 mm. Nitroglycerin  was not given. The 3D data set was reconstructed in 5% intervals of the 0-95% of the R-R cycle. Systolic and diastolic phases were analyzed on a dedicated workstation using MPR, MIP, and VRT modes. The patient received 100 cc of contrast.  FINDINGS: Image quality: Poor. The contrast remained in the SVC and  the study had poor contrast opacification.  Noise artifact is: Moderate.  Valve Morphology: Tricuspid  aortic valve that is severely calcified. There is bulky calcification of the NCC.  Aortic Valve Calcium  score: 2634  Aortic annular dimension:  Phase assessed: 25%  Annular area: 574 mm2  Annular perimeter: 86.2 mm  Max diameter: 30.1 mm  Min diameter: 24.6 mm  Membranous septum length: 13.4 mm  Annular and subannular calcification: None.  Optimal coplanar projection: RAO 3 CAU 15  Coronary Artery Height above Annulus:  Left Main: 9.2 mm  Right Coronary: 16.0 mm  Sinus of Valsalva Measurements:  Non-coronary: 33.5 mm  Right-coronary: 33.6 mm  Left-coronary: 34.5 mm  Sinus of Valsalva Height:  Non-coronary: 25.3 mm  Right-coronary: 23.9 mm  Left-coronary: 19.5 mm  Sinotubular Junction: 28 mm  Ascending Thoracic Aorta: 35 mm  Coronary Arteries: Normal coronary origin. Right dominance. The study was performed without use of NTG and is insufficient for plaque evaluation. Please refer to recent cardiac catheterization for coronary assessment. 3-vessel coronary calcifications noted.  Cardiac Morphology:  Right Atrium: Right atrial size is within normal limits. Contrast reflux into the RA consistent with elevated RA pressure.  Right Ventricle: The right ventricular cavity is within normal limits.  Left Atrium: Left atrial size is normal in size with no left atrial appendage filling defect.  Left Ventricle: The ventricular cavity size is within normal limits. There are no stigmata of prior infarction. There is no abnormal filling defect. Normal left ventricular function, LVEF=56%. No regional wall motion abnormalities.  Pulmonary arteries: Dilated suggestive of pulmonary hypertension.  Pulmonary veins: Normal pulmonary venous drainage.  Pericardium: Normal thickness with no significant effusion or calcium  present.  Mitral Valve: The mitral valve is normal structure without significant calcification.  Extra-cardiac findings: See attached  radiology report for non-cardiac structures.  IMPRESSION: 1. 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  2. Annular measurements support a 29 mm S3 TAVR (574 mm2).  3. No significant annular or subannular calcifications.  4. Shallow left main height concerning for increased risk for coronary obstruction (9.2 mm).  5. Optimal Fluoroscopic Angle for Delivery: RAO 3 CAU 15  6. Dilated pulmonary artery suggestive of pulmonary hypertension.  Darryle T. Barbaraann, MD   Electronically Signed By: Darryle Barbaraann M.D. On: 06/01/2021 22:12  Narrative EXAM: OVER-READ INTERPRETATION  CT CHEST  The following report is an over-read performed by radiologist Dr. Selinda Blue of [facility] on 06/01/2021. This over-read does not include interpretation of cardiac or coronary anatomy or pathology. The coronary CTA interpretation by the cardiologist is attached.  COMPARISON:  06/26/2017 chest CT angiogram.  FINDINGS: Please see the separate concurrent chest CT angiogram report for details.  IMPRESSION: Please see the separate concurrent chest CT angiogram report for details.  Electronically Signed: By: Selinda DELENA Blue M.D. On: 06/01/2021 13:19          EKG:  EKG is not ordered today.    Recent Labs: 12/13/2021: Magnesium  2.2 03/23/2022: ALT 16 04/06/2022: B Natriuretic Peptide 52.4 04/07/2022: BUN 23; Creatinine, Ser 1.03; Hemoglobin 13.8; Platelets 170; Potassium 4.6; Sodium 139  Recent Lipid Panel    Component Value Date/Time   CHOL 159 05/31/2021 0419   CHOL 144 02/01/2018 1101   TRIG 64 05/31/2021 0419   HDL 58 05/31/2021 0419   HDL 48 02/01/2018 1101   CHOLHDL 2.7 05/31/2021 0419   VLDL 13 05/31/2021 0419   LDLCALC 88 05/31/2021 0419   LDLCALC 95 04/08/2020 1506  Risk Assessment/Calculations:                Physical Exam:    VS:  BP 110/70 (BP Location: Left Arm, Patient Position: Sitting, Cuff Size: Normal)   Pulse (!) 56   Ht 5' 8  (1.727 m)   Wt 220 lb (99.8 kg)   SpO2 96%   BMI 33.45 kg/m     Wt Readings from Last 3 Encounters:  10/26/22 220 lb (99.8 kg)  08/02/22 222 lb (100.7 kg)  05/15/22 220 lb (99.8 kg)     GEN:  Well nourished, well developed in no acute distress HEENT: Normal NECK: No JVD; No carotid bruits LYMPHATICS: No lymphadenopathy CARDIAC: RRR, no murmurs, rubs, gallops RESPIRATORY:  Clear to auscultation without rales, wheezing or rhonchi  ABDOMEN: Soft, non-tender, non-distended MUSCULOSKELETAL:  No edema; No deformity  SKIN: Warm and dry NEUROLOGIC:  Alert and oriented x 3 PSYCHIATRIC:  Normal affect   ASSESSMENT:    1. S/P TAVR (transcatheter aortic valve replacement)   2. Essential hypertension   3. Coronary artery disease involving native coronary artery of native heart without angina pectoris   4. Endocarditis, unspecified chronicity, unspecified endocarditis type   5. Chronic diastolic CHF (congestive heart failure) (HCC)    PLAN:    In order of problems listed above:  S/P TAVR - most recent echo in January 2023 showed stable placement of aortic valve with normal functioning common. Feels better, followed by Structural Heart Team.  TAVR endocarditis - recommended lifelong amoxicillin , he does take a probiotic daily. Chronic diastolic heart failure-NYHA class I-II, symptoms seem to be most related to his COPD, euvolemic.  Continue Lasix  as needed for pedal edema. CAD -none obstructive per [facility] in 2022, Stable with no anginal symptoms. No indication for ischemic evaluation.  Continue aspirin  81 mg daily. HTN - BP today is well controlled at 110/70, continue Norvasc  10 mg daily.  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    Disposition - Return in 3 months.           Medication Adjustments/Labs and Tests Ordered: Current medicines are reviewed at length with the patient today.  Concerns regarding medicines are outlined above.  No orders of the  defined types were placed in this encounter.  No orders of the defined types were placed in this encounter.   Patient Instructions  Medication Instructions:  Your physician recommends that you continue on your current medications as directed. Please refer to the Current Medication list given to you today.  *If you need a refill on your cardiac medications before your next appointment, please call your pharmacy*   Lab Work: NONE If you have labs (blood work) drawn today and your tests are completely normal, you will receive your results only by: MyChart Message (if you have MyChart) OR A paper copy in the mail If you have any lab test that is abnormal or we need to change your treatment, we will call you to review the results.   Testing/Procedures: NONE   Follow-Up: At [facility], you and your health needs are our priority.  As part of our continuing mission to provide you with exceptional heart care, we have created designated Provider Care Teams.  These Care Teams include your primary Cardiologist (physician) and Advanced Practice Providers (APPs -  Physician Assistants and Nurse Practitioners) who all work together to provide you with the care you need, when you need it.  We recommend signing up for  the patient portal called MyChart.  Sign up information is provided on this After Visit Summary.  MyChart is used to connect with patients for Virtual Visits (Telemedicine).  Patients are able to view lab/test results, encounter notes, upcoming appointments, etc.  Non-urgent messages can be sent to your provider as well.   To learn more about what you can do with MyChart, go to [email or web address].    Your next appointment:   3 month(s)  Provider:   Dalyce Renne Crape, MD    Other Instructions     Signed, Delon JAYSON Hoover, NP  10/26/2022 4:46 PM    [facility] "

## 2022-10-26 ENCOUNTER — Encounter: Payer: Self-pay | Admitting: Cardiology

## 2022-10-26 ENCOUNTER — Ambulatory Visit: Payer: Medicare Other | Attending: Cardiology | Admitting: Cardiology

## 2022-10-26 VITALS — BP 110/70 | HR 56 | Ht 68.0 in | Wt 220.0 lb

## 2022-10-26 DIAGNOSIS — I10 Essential (primary) hypertension: Secondary | ICD-10-CM | POA: Diagnosis present

## 2022-10-26 DIAGNOSIS — I5032 Chronic diastolic (congestive) heart failure: Secondary | ICD-10-CM

## 2022-10-26 DIAGNOSIS — I38 Endocarditis, valve unspecified: Secondary | ICD-10-CM | POA: Diagnosis present

## 2022-10-26 DIAGNOSIS — I2510 Atherosclerotic heart disease of native coronary artery without angina pectoris: Secondary | ICD-10-CM

## 2022-10-26 DIAGNOSIS — Z952 Presence of prosthetic heart valve: Secondary | ICD-10-CM

## 2022-10-26 NOTE — Patient Instructions (Signed)
Medication Instructions:  Your physician recommends that you continue on your current medications as directed. Please refer to the Current Medication list given to you today.  *If you need a refill on your cardiac medications before your next appointment, please call your pharmacy*   Lab Work: NONE If you have labs (blood work) drawn today and your tests are completely normal, you will receive your results only by: MyChart Message (if you have MyChart) OR A paper copy in the mail If you have any lab test that is abnormal or we need to change your treatment, we will call you to review the results.   Testing/Procedures: NONE   Follow-Up: At [facility], you and your health needs are our priority.  As part of our continuing mission to provide you with exceptional heart care, we have created designated Provider Care Teams.  These Care Teams include your primary Cardiologist (physician) and Advanced Practice Providers (APPs -  Physician Assistants and Nurse Practitioners) who all work together to provide you with the care you need, when you need it.  We recommend signing up for the patient portal called "MyChart".  Sign up information is provided on this After Visit Summary.  MyChart is used to connect with patients for Virtual Visits (Telemedicine).  Patients are able to view lab/test results, encounter notes, upcoming appointments, etc.  Non-urgent messages can be sent to your provider as well.   To learn more about what you can do with MyChart, go to [email or web address].    Your next appointment:   3 month(s)  Provider:   Belva Crome, MD    Other Instructions

## 2022-11-14 ENCOUNTER — Ambulatory Visit: Payer: Medicare Other | Admitting: Internal Medicine

## 2022-11-15 ENCOUNTER — Ambulatory Visit: Payer: Medicare Other | Admitting: Internal Medicine

## 2022-11-23 ENCOUNTER — Ambulatory Visit (INDEPENDENT_AMBULATORY_CARE_PROVIDER_SITE_OTHER): Payer: Medicare Other | Admitting: Urology

## 2022-11-23 ENCOUNTER — Encounter: Payer: Self-pay | Admitting: Urology

## 2022-11-23 VITALS — BP 142/74 | HR 59 | Ht 68.0 in | Wt 225.0 lb

## 2022-11-23 DIAGNOSIS — N3281 Overactive bladder: Secondary | ICD-10-CM | POA: Diagnosis not present

## 2022-11-23 DIAGNOSIS — N2889 Other specified disorders of kidney and ureter: Secondary | ICD-10-CM

## 2022-11-23 NOTE — Progress Notes (Signed)
Assessment: 1. OAB (overactive bladder)   2. Renal mass      Plan: Today had a long discussion with him regarding his refractory OAB.  He did previously respond to Myrbetriq but could not afford it.  Samples today of Gemtesa 75mg  daily were provided to see how he responds.  He will let us know.  May need urodynamics.  Concerning his small left renal mass we have elected to continue serial surveillance follow-up.  He is due for repeat imaging.  Will schedule CT abdomen renal mass protocol.  Will review and contact patient with findings and further recommendations for ongoing follow-up.  Chief Complaint: renal mass/oab  History of Present Illness:  Kyle Lewis is a 77 y.o. male with multiple serious medical comorbidities (see below) who is seen today regarding a number of urologic issues. Patient well known to me from prior care (last seen 10/2019) largely for refractory OAB type symptoms.  He states his symptoms have not changed.  He continues to have significant urinary frequency, urgency and urge incontinence and wears several pads per day.  He previously was improved on Myrbetriq but could not afford it.  I previously have recommended urodynamic but he did not follow through with testing.  Patient also has prior h/o USD.  No issues with FOS, straining etc to suggest recurrence.  Last year he was seen by Dr. Laverle Patter for incidental finding of a 1.5 cm anterior interpolar left renal mass most c/w small RCC.  Decision was made (given age and serious comorbidities to observe with serial imaging.  Initially imaged on CT 05/2021 and no growth noted on FU at time of last scan 01/2022.    Past Medical History:  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   Abfraction    Abnormal gait 07/31/2019   Abscess of left leg    Accelerated hypertension 12/01/2021   Accretions on teeth    Acute on chronic diastolic heart  failure (HCC)    Acute respiratory failure with hypoxemia (HCC) 04/07/2022   Acute respiratory failure with hypoxia (HCC) 12/01/2021   Acute respiratory failure with hypoxia and hypercapnia (HCC) 05/27/2021   HC03   06/14/21    =  31  - 06/23/2021   Walked 250 ft  at a slow pace with a cane. Complained of back and left knee hurting with lowest sat 89% so d/c'd 02    Adenomatous colon polyp    Adrenal mass (HCC)    2 benign appearing left adrenal adenomas noted on 01/13/15 CT   Allergy    seasonal   Anxiety 04/08/2020   Arthritis    Atherosclerosis of aorta (HCC) 04/03/2022   Blister of multiple sites of lower extremity 01/10/2018   Body mass index (BMI) 34.0-34.9, adult 04/07/2020   BPH with obstruction/lower urinary tract symptoms    Overview:  Probable based on symptoms   CAD (coronary artery disease) 02/01/2018   CAP (community acquired pneumonia) 11/30/2021   Caries    Carotid arterial disease (HCC) 05/23/2012   Carotid artery occlusion    Chronic back pain    Chronic diastolic CHF (congestive heart failure) (HCC)    Chronic periodontitis    Cigarette smoker 09/05/2018   COLONIC POLYPS, ADENOMATOUS, HX OF 03/17/2010   Qualifier: Diagnosis of  By: Nelson-Smith CMA (AAMA), Dottie     COPD exacerbation (HCC) 04/06/2022   COPD GOLD II 04/07/2019  Quit smoking 03/2019 - PFT's  04/07/2019  FEV1 1.58 (57 % ) ratio 0.54  p 0 % improvement from saba p nothing prior to study with DLCO  15.22 (66%) corrects to 3.05 (74%)  for alv volume and FV curve concave classically     COPD with acute exacerbation (HCC) 12/01/2021   Degeneration of cervical intervertebral disc    Degeneration of lumbar intervertebral disc 06/14/2015   Depression 03/17/2010   Qualifier: Diagnosis of  By: Nelson-Smith CMA (AAMA), Dottie     Discitis 09/29/2022   Diverticulosis    DIVERTICULOSIS, COLON 03/17/2010   Qualifier: Diagnosis of  By: Candice Camp CMA (AAMA), Dottie     Dyspnea    Encounter for dental  examination    Epidural abscess 01/26/2022   Excessive dental attrition    Exogenous obesity 11/08/2013   Overview:  With a nine pound weight gain since his last visit   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 Darrick Penna and 06/10/2015 last visit   Impairment of balance 10/27/2021   Inflammation of sacroiliac joint (HCC) 05/06/2019   Lower urinary tract symptoms (LUTS) 08/06/2017   Meningioma (HCC)    Microscopic hematuria 11/08/2013   Mitral regurgitation 06/02/2021   Muscle weakness 10/27/2021   Neck pain 03/25/2019   Nocturia    OA (osteoarthritis) of knee 04/01/2012   OAB (overactive bladder) 01/19/2014   Olecranon bursitis of left elbow 11/08/2021   OSA (obstructive sleep apnea)    Bipap per Chodri since ? 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 Clifton James & Dr. Laneta Simmers   Severe aortic  stenosis 02/01/2018   ECHO 01/30/18 - Left ventricle: The cavity size was normal. There was moderate   focal basal hypertrophy of the septum. Systolic function was   vigorous. The estimated ejection fraction was in the range of 65%   to 70%. Wall motion was normal; there were no regional wall   motion abnormalities. Doppler parameters are consistent with   abnormal left ventricular relaxation (grade 1 diastolic   dysfun   Status post AAA (abdominal aortic aneurysm) repair 02/01/2018   Stroke (HCC) 2013   tia no residual deficit from   Ventral hernia 11/08/2013   Overview:  S/p repair with alloderm mesh and s/p SB resection  Past Surgical History:  Past Surgical History:  Procedure Laterality Date   ABDOMINAL AORTIC ANEURYSM REPAIR  2019   [place]   APPENDECTOMY  as child   BACK SURGERY  2018   Rods and Screws in Back lower back   BRAIN MENINGIOMA EXCISION  1991   menigioma   CARPAL TUNNEL RELEASE     left   EYE SURGERY Bilateral    Cataract   IRRIGATION AND DEBRIDEMENT ABSCESS Left 08/25/2020   Procedure: IRRIGATION AND DEBRIDEMENT ABSCESS LEFT LEG;  Surgeon: Park Liter, DPM;  Location: [facility] [place];  Service: Podiatry;  Laterality: Left;   JOINT REPLACEMENT Left 04-01-12   Knee   LEFT HEART CATH AND CORONARY ANGIOGRAPHY N/A 09/15/2016   Procedure: Left Heart Cath and Coronary Angiography;  Surgeon: Yvonne Kendall, MD;  Location: [facility] INVASIVE CV LAB;  Service: Cardiovascular;  Laterality: N/A;   MAXIMUM ACCESS (MAS)POSTERIOR LUMBAR INTERBODY FUSION (PLIF) 1 LEVEL N/A 07/07/2015   Procedure:  POSTERIOR LUMBAR INTERBODY FUSION (PLIF) Lumbar Four-Five with Pedicle Screw Fixation Lumbar Two-Five;Laminectomy Lumbar Two-Five;  Surgeon: Tia Alert, MD;  Location: [facility] NEURO ORS;  Service: Neurosurgery;  Laterality: N/A;   POSTERIOR LUMBAR INTERBODY FUSION (PLIF) Lumbar Four-Five with Pedicle Screw Fixation Lumbar Two-Five;Laminectomy Lumbar Two-Five   RIGHT/LEFT HEART  CATH AND CORONARY ANGIOGRAPHY N/A 01/24/2018   Procedure: RIGHT/LEFT HEART CATH AND CORONARY ANGIOGRAPHY;  Surgeon: Yvonne Kendall, MD;  Location: [facility] INVASIVE CV LAB;  Service: Cardiovascular;  Laterality: N/A;   RIGHT/LEFT HEART CATH AND CORONARY ANGIOGRAPHY N/A 05/31/2021   Procedure: RIGHT/LEFT HEART CATH AND CORONARY ANGIOGRAPHY;  Surgeon: Kathleene Hazel, MD;  Location: [facility] INVASIVE CV LAB;  Service: Cardiovascular;  Laterality: N/A;   TEE WITHOUT CARDIOVERSION N/A 06/07/2021   Procedure: TRANSESOPHAGEAL ECHOCARDIOGRAM (TEE);  Surgeon: Kathleene Hazel, MD;  Location: [facility] OR;  Service: Open Heart Surgery;  Laterality: N/A;   TEE WITHOUT CARDIOVERSION N/A 12/08/2021   Procedure: TRANSESOPHAGEAL ECHOCARDIOGRAM (TEE);  Surgeon: Thomasene Ripple, DO;  Location: [facility] ENDOSCOPY;  Service: Cardiovascular;  Laterality: N/A;   TONSILLECTOMY  as child   TOTAL KNEE ARTHROPLASTY  04/01/2012   Procedure: TOTAL KNEE ARTHROPLASTY;  Surgeon: Loanne Drilling, MD;  Location: [facility] ORS;  Service: Orthopedics;  Laterality: Left;   TRANSCATHETER AORTIC VALVE REPLACEMENT, TRANSFEMORAL Bilateral 06/07/2021   Procedure: TRANSCATHETER AORTIC VALVE REPLACEMENT, RIGHT TRANSFEMORAL;  Surgeon: Kathleene Hazel, MD;  Location: [facility] OR;  Service: Open Heart Surgery;  Laterality: Bilateral;   TRANSURETHRAL RESECTION OF PROSTATE     ULTRASOUND GUIDANCE FOR VASCULAR ACCESS Bilateral 06/07/2021   Procedure: ULTRASOUND GUIDANCE FOR VASCULAR ACCESS;  Surgeon: Kathleene Hazel, MD;  Location: [facility] OR;  Service: Open Heart Surgery;  Laterality: Bilateral;   UMBILICAL HERNIA REPAIR  2011    Allergies:  Allergies  Allergen Reactions   Cefazolin Rash and Other (See Comments)    The patient had surgery and was given cefazolin intraop. ~ 10 days later he developed a rash confirmed by biopsy to be consistent w/ drug eruption. We cannot know for sure, but this is the most likely agent.      Family History:  Family History   Problem Relation Age of Onset   Heart disease Mother        Onset ~72 y/o   Hypertension Mother        Deceased from old age at 61   Hyperlipidemia Mother    Arthritis Mother    Diabetes Father  Deceased from old age at 80   Heart attack Father    Heart disease Father        CABG at age 72   Dementia Father 48   Arthritis Father    Prostate cancer Maternal Grandfather    Colon cancer Neg Hx    Esophageal cancer Neg Hx    Rectal cancer Neg Hx    Stomach cancer Neg Hx     Social History:  Social History   Tobacco Use   Smoking status: Former    Packs/day: 0.75    Years: 40.00    Additional pack years: 0.00    Total pack years: 30.00    Types: Cigarettes    Quit date: 05/05/2021    Years since quitting: 1.5   Smokeless tobacco: Never   Tobacco comments:    1/2 pack a day if that   Vaping Use   Vaping Use: Never used  Substance Use Topics   Alcohol use: Yes    Comment: rare   Drug use: No    Review of symptoms:  Constitutional:  Negative for unexplained weight loss, night sweats, fever, chills ENT:  Negative for nose bleeds, sinus pain, painful swallowing CV:  Negative for chest pain, shortness of breath, exercise intolerance, palpitations, loss of consciousness Resp:  Negative for cough, wheezing, shortness of breath GI:  Negative for nausea, vomiting, diarrhea, bloody stools GU:  Positives noted in HPI; otherwise negative for gross hematuria, dysuria, urinary incontinence Neuro:  Negative for seizures, poor balance, limb weakness, slurred speech Psych:  Negative for lack of energy, depression, anxiety Endocrine:  Negative for polydipsia, polyuria, symptoms of hypoglycemia (dizziness, hunger, sweating) Hematologic:  Negative for anemia, purpura, petechia, prolonged or excessive bleeding, use of anticoagulants  Allergic:  Negative for difficulty breathing or choking as a result of exposure to anything; no shellfish allergy; no allergic response (rash/itch) to  materials, foods  Physical exam: BP (!) 142/74   Pulse (!) 59   Ht 5\' 8"  (1.727 m)   Wt 225 lb (102.1 kg)   BMI 34.21 kg/m  GENERAL APPEARANCE:  Well appearing, well developed, well nourished, NAD   DRE: Normal sphincter tone; prostate is approximately 30 g without evidence of nodules or induration

## 2022-11-23 NOTE — Addendum Note (Signed)
Addended by: Carolin Coy on: 11/23/2022 03:22 PM   Modules accepted: Orders

## 2022-11-24 LAB — BASIC METABOLIC PANEL
BUN/Creatinine Ratio: 25 — ABNORMAL HIGH (ref 10–24)
BUN: 20 mg/dL (ref 8–27)
CO2: 27 mmol/L (ref 20–29)
Calcium: 9.2 mg/dL (ref 8.6–10.2)
Chloride: 103 mmol/L (ref 96–106)
Creatinine, Ser: 0.79 mg/dL (ref 0.76–1.27)
Glucose: 118 mg/dL — ABNORMAL HIGH (ref 70–99)
Potassium: 4.9 mmol/L (ref 3.5–5.2)
Sodium: 143 mmol/L (ref 134–144)
eGFR: 92 mL/min/{1.73_m2} (ref >59)

## 2022-11-29 ENCOUNTER — Ambulatory Visit (HOSPITAL_BASED_OUTPATIENT_CLINIC_OR_DEPARTMENT_OTHER)
Admission: RE | Admit: 2022-11-29 | Discharge: 2022-11-29 | Disposition: A | Discharge status: Home / Self Care | Payer: Medicare Other | Source: Ambulatory Visit | Attending: Urology | Admitting: Urology

## 2022-11-29 DIAGNOSIS — N2889 Other specified disorders of kidney and ureter: Secondary | ICD-10-CM | POA: Diagnosis present

## 2022-11-29 MED ORDER — IOHEXOL 300 MG/ML  SOLN
100.0000 mL | Freq: Once | INTRAMUSCULAR | Status: AC | PRN
  Administered 2022-11-29: 100 mL via INTRAVENOUS

## 2023-01-09 ENCOUNTER — Telehealth: Payer: Self-pay

## 2023-01-09 ENCOUNTER — Other Ambulatory Visit: Payer: Self-pay | Admitting: Urology

## 2023-01-09 DIAGNOSIS — N2889 Other specified disorders of kidney and ureter: Secondary | ICD-10-CM

## 2023-01-09 NOTE — Telephone Encounter (Signed)
-----   Message from Joline Maxcy sent at 01/09/2023  3:00 PM EDT ----- Regarding: RE: follow up Since the scan got done he does not need one until December. I dont need to see until then I placed order for repeat ct active 05/06/23 Set up to see me few weeks later thanks ----- Message ----- From: Carolin Coy, CMA Sent: 01/08/2023   9:40 AM EDT To: Joline Maxcy, MD Subject: follow up                                      This is the message I got about pt back in June:   Please schedule this patient for FU visit with me in early September. (02/13/23)  He has left renal mass on surveillance.  He needs a bmp few weeks prior to visit and CT- renal mass protocol few days before visit.   He had the CT in June. Does he need a repeat one in Sept? Does he still need the bloodwork prior to Sept appt?    N., CMA(AAMA)

## 2023-01-09 NOTE — Telephone Encounter (Signed)
Attempted to call patient, there was not answer.

## 2023-01-10 NOTE — Telephone Encounter (Signed)
Spoke with spouse, she is aware that pt does not need an appt in Sept and that we will call back in November to schedule an appt in December with a CT scan prior.

## 2023-02-06 ENCOUNTER — Ambulatory Visit: Payer: Medicare Other | Attending: Cardiology | Admitting: Cardiology

## 2023-02-06 ENCOUNTER — Ambulatory Visit: Payer: Medicare Other | Admitting: Cardiology

## 2023-02-13 ENCOUNTER — Ambulatory Visit: Payer: Medicare Other | Admitting: Urology

## 2023-03-19 ENCOUNTER — Encounter: Payer: Self-pay | Admitting: Podiatry

## 2023-03-19 ENCOUNTER — Ambulatory Visit (INDEPENDENT_AMBULATORY_CARE_PROVIDER_SITE_OTHER): Payer: Medicare Other | Admitting: Podiatry

## 2023-03-19 DIAGNOSIS — M79675 Pain in left toe(s): Secondary | ICD-10-CM | POA: Diagnosis not present

## 2023-03-19 DIAGNOSIS — M79674 Pain in right toe(s): Secondary | ICD-10-CM

## 2023-03-19 DIAGNOSIS — I739 Peripheral vascular disease, unspecified: Secondary | ICD-10-CM | POA: Diagnosis not present

## 2023-03-19 DIAGNOSIS — B353 Tinea pedis: Secondary | ICD-10-CM | POA: Diagnosis not present

## 2023-03-19 DIAGNOSIS — D689 Coagulation defect, unspecified: Secondary | ICD-10-CM

## 2023-03-19 DIAGNOSIS — B351 Tinea unguium: Secondary | ICD-10-CM

## 2023-03-19 MED ORDER — KETOCONAZOLE 2 % EX CREA: 1.0000 | TOPICAL_CREAM | Freq: Every day | CUTANEOUS | 0 refills | Status: AC

## 2023-03-19 NOTE — Progress Notes (Signed)
Subjective:  Patient ID: Kyle Lewis, male    DOB: [birth year],  MRN: [id number]  Chief Complaint  Patient presents with   Routine Foot Care    Onychomycosis, nail care. Also significant dry xerotic pedal skin    77 y.o. male presents with the above complaint. History confirmed with patient. Patient presenting with pain related to dystrophic thickened elongated nails. Patient is unable to trim own nails related to nail dystrophy and/or mobility issues. Patient does not have a history of T2DM.  Patient does have dry peeling skin on both feet  Objective:  Physical Exam: warm, good capillary refill, DP and PT pulses 2/4 bilateral nail exam onychomycosis of the toenails, onycholysis, and dystrophic nails spotty red rash present in moccasin distribution bilateral feet with dry xerotic skin peeling DP pulses palpable, PT pulses palpable, and protective sensation intact Left Foot:  Pain with palpation of nails due to elongation and dystrophic growth.  Right Foot: Pain with palpation of nails due to elongation and dystrophic growth. Rigid 2nd hammertoe deformity with 2nd toe medially deviated and abutting the lateral right hallux.   Assessment:   1. Tinea pedis of both feet   2. Pain due to onychomycosis of toenails of both feet   3. Coagulation defect (HCC)   4. Peripheral vascular disease (HCC)        Plan:  Patient was evaluated and treated and all questions answered.  # Tinea pedis bilateral Discussed the etiology and treatment options for tinea pedis.  Discussed topical and oral treatment.  Recommended topical treatment with 2% ketoconazole cream.  This was sent to the patient's pharmacy.  Also discussed appropriate foot hygiene, use of antifungal spray such as Tinactin in shoes, as well as cleaning her foot surfaces such as showers and bathroom floors with bleach. -I certify that this diagnosis represents a distinct and separate diagnosis that requires evaluation and treatment  separate from other procedures or diagnosis   #Onychomycosis with pain  -Nails palliatively debrided as below. -Educated on self-care  Procedure: Nail Debridement Rationale: Pain Type of Debridement: manual, sharp debridement. Instrumentation: Nail nipper, rotary burr. Number of Nails: 10  Return in about 3 months (around 06/19/2023) for RFC.         Corinna Gab, DPM [facility] / [facility]

## 2023-05-15 ENCOUNTER — Telehealth (HOSPITAL_BASED_OUTPATIENT_CLINIC_OR_DEPARTMENT_OTHER): Payer: Self-pay

## 2023-05-16 ENCOUNTER — Ambulatory Visit (HOSPITAL_BASED_OUTPATIENT_CLINIC_OR_DEPARTMENT_OTHER): Payer: Medicare Other

## 2023-05-23 ENCOUNTER — Ambulatory Visit: Payer: Medicare Other | Admitting: Urology

## 2023-05-23 NOTE — Progress Notes (Deleted)
Assessment: No diagnosis found.  Plan: ***  Chief Complaint: No chief complaint on file.   HPI: Kyle Lewis is a 77 y.o. male who presents for continued evaluation of a number of urologic issues. Patient has a long history of refractory OAB type symptoms.  He states his symptoms have not changed.  He continues to have significant urinary frequency, urgency and urge incontinence and wears several pads per day.  He previously was improved on Myrbetriq but could not afford it.  I previously have recommended urodynamics but he did not follow through with testing.   Patient also has prior h/o USD.  No issues with FOS, straining etc to suggest recurrence.   Last year he was seen by Dr. Laverle Patter at [facility] for incidental finding of a 1.5 cm anterior interpolar left renal mass most c/w small RCC.  Decision was made (given age and serious comorbidities to observe with serial imaging.  Initially imaged on CT 05/2021 and no growth noted on FU 01/2022 and 11/2022.  Portions of the above documentation were copied from a prior visit for review purposes only.  Allergies: Allergies  Allergen Reactions   Cefazolin Rash and Other (See Comments)    The patient had surgery and was given cefazolin intraop. ~ 10 days later he developed a rash confirmed by biopsy to be consistent w/ drug eruption. We cannot know for sure, but this is the most likely agent.      PMH: 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   Abfraction    Abnormal gait 07/31/2019   Abscess of left leg    Accelerated hypertension 12/01/2021   Accretions on teeth    Acute on chronic diastolic heart failure (HCC)    Acute respiratory failure with hypoxemia (HCC) 04/07/2022   Acute respiratory failure with hypoxia (HCC) 12/01/2021   Acute respiratory failure with hypoxia and hypercapnia (HCC) 05/27/2021   HC03   06/14/21    =  31  - 06/23/2021   Walked 250 ft   at a slow pace with a cane. Complained of back and left knee hurting with lowest sat 89% so d/c'd 02    Adenomatous colon polyp    Adrenal mass (HCC)    2 benign appearing left adrenal adenomas noted on 01/13/15 CT   Allergy    seasonal   Anxiety 04/08/2020   Arthritis    Atherosclerosis of aorta (HCC) 04/03/2022   Blister of multiple sites of lower extremity 01/10/2018   Body mass index (BMI) 34.0-34.9, adult 04/07/2020   BPH with obstruction/lower urinary tract symptoms    Overview:  Probable based on symptoms   CAD (coronary artery disease) 02/01/2018   CAP (community acquired pneumonia) 11/30/2021   Caries    Carotid arterial disease (HCC) 05/23/2012   Carotid artery occlusion    Chronic back pain    Chronic diastolic CHF (congestive heart failure) (HCC)    Chronic periodontitis    Cigarette smoker 09/05/2018   COLONIC POLYPS, ADENOMATOUS, HX OF 03/17/2010   Qualifier: Diagnosis of  By: Nelson-Smith CMA (AAMA), Dottie     COPD exacerbation (HCC) 04/06/2022   COPD GOLD II 04/07/2019   Quit smoking 03/2019 - PFT's  04/07/2019  FEV1 1.58 (57 % ) ratio 0.54  p 0 % improvement from saba p nothing prior to study with DLCO  15.22 (66%) corrects to 3.05 (74%)  for alv volume  and FV curve concave classically     COPD with acute exacerbation (HCC) 12/01/2021   Degeneration of cervical intervertebral disc    Degeneration of lumbar intervertebral disc 06/14/2015   Depression 03/17/2010   Qualifier: Diagnosis of  By: Nelson-Smith CMA (AAMA), Dottie     Discitis 09/29/2022   Diverticulosis    DIVERTICULOSIS, COLON 03/17/2010   Qualifier: Diagnosis of  By: Candice Camp CMA (AAMA), Dottie     Dyspnea    Encounter for dental examination    Epidural abscess 01/26/2022   Excessive dental attrition    Exogenous obesity 11/08/2013   Overview:  With a nine pound weight gain since his last visit   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 Darrick Penna and 06/10/2015 last visit   Impairment of balance 10/27/2021   Inflammation of sacroiliac joint (HCC) 05/06/2019   Lower urinary tract symptoms (LUTS) 08/06/2017   Meningioma (HCC)    Microscopic hematuria 11/08/2013   Mitral regurgitation 06/02/2021   Muscle weakness 10/27/2021   Neck pain 03/25/2019   OA (osteoarthritis) of knee 04/01/2012   OAB (overactive bladder) 01/19/2014   Olecranon bursitis of left elbow 11/08/2021   OSA (obstructive sleep apnea)    Bipap per Chodri since ? 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 Clifton James & Dr. Laneta Simmers   Severe aortic stenosis 02/01/2018   ECHO 01/30/18 - Left ventricle: The cavity size was normal. There was moderate   focal basal hypertrophy of the septum. Systolic function was   vigorous. The estimated ejection fraction was in the range of 65%   to 70%. Wall motion was normal; there were no  regional wall   motion abnormalities. Doppler parameters are consistent with   abnormal left ventricular relaxation (grade 1 diastolic   dysfun   Status post AAA (abdominal aortic aneurysm) repair 02/01/2018   Stroke (HCC) 2013   tia no residual deficit from   Teeth missing    Ventral hernia 11/08/2013   Overview:  S/p repair with alloderm mesh and s/p SB resection    PSH: Past Surgical History:  Procedure Laterality Date   ABDOMINAL AORTIC ANEURYSM REPAIR  2019   [facility]   APPENDECTOMY  as child   BACK SURGERY  2018   Rods and Screws  in Back lower back   BRAIN MENINGIOMA EXCISION  1991   menigioma   CARPAL TUNNEL RELEASE     left   EYE SURGERY Bilateral    Cataract   IRRIGATION AND DEBRIDEMENT ABSCESS Left 08/25/2020   Procedure: IRRIGATION AND DEBRIDEMENT ABSCESS LEFT LEG;  Surgeon: Park Liter, DPM;  Location: [facility];  Service: Podiatry;  Laterality: Left;   JOINT REPLACEMENT Left 04-01-12   Knee   LEFT HEART CATH AND CORONARY ANGIOGRAPHY N/A 09/15/2016   Procedure: Left Heart Cath and Coronary Angiography;  Surgeon: Yvonne Kendall, MD;  Location: [facility] INVASIVE CV LAB;  Service: Cardiovascular;  Laterality: N/A;   MAXIMUM ACCESS (MAS)POSTERIOR LUMBAR INTERBODY FUSION (PLIF) 1 LEVEL N/A 07/07/2015   Procedure:  POSTERIOR LUMBAR INTERBODY FUSION (PLIF) Lumbar Four-Five with Pedicle Screw Fixation Lumbar Two-Five;Laminectomy Lumbar Two-Five;  Surgeon: Tia Alert, MD;  Location: [facility] NEURO ORS;  Service: Neurosurgery;  Laterality: N/A;   POSTERIOR LUMBAR INTERBODY FUSION (PLIF) Lumbar Four-Five with Pedicle Screw Fixation Lumbar Two-Five;Laminectomy Lumbar Two-Five   RIGHT/LEFT HEART CATH AND CORONARY ANGIOGRAPHY N/A 01/24/2018   Procedure: RIGHT/LEFT HEART CATH AND CORONARY ANGIOGRAPHY;  Surgeon: Yvonne Kendall, MD;  Location: [facility] INVASIVE CV LAB;  Service: Cardiovascular;  Laterality: N/A;   RIGHT/LEFT HEART CATH AND CORONARY ANGIOGRAPHY N/A 05/31/2021    Procedure: RIGHT/LEFT HEART CATH AND CORONARY ANGIOGRAPHY;  Surgeon: Kathleene Hazel, MD;  Location: [facility] INVASIVE CV LAB;  Service: Cardiovascular;  Laterality: N/A;   TEE WITHOUT CARDIOVERSION N/A 06/07/2021   Procedure: TRANSESOPHAGEAL ECHOCARDIOGRAM (TEE);  Surgeon: Kathleene Hazel, MD;  Location: [facility] OR;  Service: Open Heart Surgery;  Laterality: N/A;   TEE WITHOUT CARDIOVERSION N/A 12/08/2021   Procedure: TRANSESOPHAGEAL ECHOCARDIOGRAM (TEE);  Surgeon: Thomasene Ripple, DO;  Location: [facility] ENDOSCOPY;  Service: Cardiovascular;  Laterality: N/A;   TONSILLECTOMY  as child   TOTAL KNEE ARTHROPLASTY  04/01/2012   Procedure: TOTAL KNEE ARTHROPLASTY;  Surgeon: Loanne Drilling, MD;  Location: [facility] ORS;  Service: Orthopedics;  Laterality: Left;   TRANSCATHETER AORTIC VALVE REPLACEMENT, TRANSFEMORAL Bilateral 06/07/2021   Procedure: TRANSCATHETER AORTIC VALVE REPLACEMENT, RIGHT TRANSFEMORAL;  Surgeon: Kathleene Hazel, MD;  Location: [facility] OR;  Service: Open Heart Surgery;  Laterality: Bilateral;   TRANSURETHRAL RESECTION OF PROSTATE     ULTRASOUND GUIDANCE FOR VASCULAR ACCESS Bilateral 06/07/2021   Procedure: ULTRASOUND GUIDANCE FOR VASCULAR ACCESS;  Surgeon: Kathleene Hazel, MD;  Location: [facility] OR;  Service: Open Heart Surgery;  Laterality: Bilateral;   UMBILICAL HERNIA REPAIR  2011    SH: Social History   Tobacco Use   Smoking status: Former    Current packs/day: 0.00    Average packs/day: 0.8 packs/day for 40.0 years (30.0 ttl pk-yrs)    Types: Cigarettes    Start date: 05/05/1981    Quit date: 05/05/2021    Years since quitting: 2.0   Smokeless tobacco: Never   Tobacco comments:    1/2 pack a day if that   Vaping Use   Vaping status: Never Used  Substance Use Topics   Alcohol use: Yes    Comment: rare   Drug use: No    ROS: Constitutional:  Negative for fever, chills, weight loss CV: Negative for chest pain, previous MI, hypertension Respiratory:  Negative for shortness  of breath, wheezing, sleep apnea, frequent cough GI:  Negative for nausea, vomiting, bloody stool, GERD  PE: There were no vitals taken for this visit. GENERAL APPEARANCE:  Well appearing, well developed, well nourished, NAD  HEENT:  Atraumatic, normocephalic, oropharynx clear NECK:  Supple without lymphadenopathy or thyromegaly ABDOMEN:  Soft, non-tender, no masses EXTREMITIES:  Moves all extremities well, without clubbing, cyanosis, or edema NEUROLOGIC:  Alert and oriented x 3, normal gait, CN II-XII grossly intact MENTAL STATUS:  appropriate BACK:  Non-tender to palpation, No CVAT SKIN:  Warm, dry, and intact   Results: No results found for this or any previous visit (from the past 24 hours).

## 2023-06-04 ENCOUNTER — Emergency Department (HOSPITAL_COMMUNITY): Payer: Medicare Other

## 2023-06-04 ENCOUNTER — Other Ambulatory Visit: Payer: Self-pay

## 2023-06-04 ENCOUNTER — Encounter (HOSPITAL_COMMUNITY): Payer: Self-pay | Admitting: Emergency Medicine

## 2023-06-04 ENCOUNTER — Emergency Department (HOSPITAL_COMMUNITY)
Admission: EM | Admit: 2023-06-04 | Discharge: 2023-06-04 | Disposition: A | Discharge status: Home / Self Care | Payer: Medicare Other | Attending: Emergency Medicine | Admitting: Emergency Medicine

## 2023-06-04 DIAGNOSIS — G4733 Obstructive sleep apnea (adult) (pediatric): Secondary | ICD-10-CM | POA: Insufficient documentation

## 2023-06-04 DIAGNOSIS — J449 Chronic obstructive pulmonary disease, unspecified: Secondary | ICD-10-CM | POA: Insufficient documentation

## 2023-06-04 DIAGNOSIS — R410 Disorientation, unspecified: Secondary | ICD-10-CM | POA: Insufficient documentation

## 2023-06-04 DIAGNOSIS — I10 Essential (primary) hypertension: Secondary | ICD-10-CM | POA: Insufficient documentation

## 2023-06-04 LAB — URINALYSIS, ROUTINE W REFLEX MICROSCOPIC
Bilirubin Urine: NEGATIVE
Glucose, UA: NEGATIVE mg/dL
Hgb urine dipstick: NEGATIVE
Ketones, ur: NEGATIVE mg/dL
Leukocytes,Ua: NEGATIVE
Nitrite: NEGATIVE
Protein, ur: NEGATIVE mg/dL
Specific Gravity, Urine: 1.005 (ref 1.005–1.030)
pH: 7 (ref 5.0–8.0)

## 2023-06-04 LAB — COMPREHENSIVE METABOLIC PANEL
ALT: 17 U/L (ref 0–44)
AST: 17 U/L (ref 15–41)
Albumin: 3.6 g/dL (ref 3.5–5.0)
Alkaline Phosphatase: 74 U/L (ref 38–126)
Anion gap: 9 (ref 5–15)
BUN: 19 mg/dL (ref 8–23)
CO2: 29 mmol/L (ref 22–32)
Calcium: 8.8 mg/dL — ABNORMAL LOW (ref 8.9–10.3)
Chloride: 102 mmol/L (ref 98–111)
Creatinine, Ser: 0.71 mg/dL (ref 0.61–1.24)
GFR, Estimated: >60 mL/min (ref >60)
Glucose, Bld: 108 mg/dL — ABNORMAL HIGH (ref 70–99)
Potassium: 3.9 mmol/L (ref 3.5–5.1)
Sodium: 140 mmol/L (ref 135–145)
Total Bilirubin: 0.7 mg/dL (ref 0.0–1.2)
Total Protein: 6 g/dL — ABNORMAL LOW (ref 6.5–8.1)

## 2023-06-04 LAB — CBC WITH DIFFERENTIAL/PLATELET
Abs Immature Granulocytes: 0.04 10*3/uL (ref 0.00–0.07)
Basophils Absolute: 0 10*3/uL (ref 0.0–0.1)
Basophils Relative: 0 %
Eosinophils Absolute: 0.2 10*3/uL (ref 0.0–0.5)
Eosinophils Relative: 3 %
HCT: 40.4 % (ref 39.0–52.0)
Hemoglobin: 13.7 g/dL (ref 13.0–17.0)
Immature Granulocytes: 1 %
Lymphocytes Relative: 17 %
Lymphs Abs: 1.1 10*3/uL (ref 0.7–4.0)
MCH: 31.1 pg (ref 26.0–34.0)
MCHC: 33.9 g/dL (ref 30.0–36.0)
MCV: 91.6 fL (ref 80.0–100.0)
Monocytes Absolute: 0.4 10*3/uL (ref 0.1–1.0)
Monocytes Relative: 7 %
Neutro Abs: 4.6 10*3/uL (ref 1.7–7.7)
Neutrophils Relative %: 72 %
Platelets: 189 10*3/uL (ref 150–400)
RBC: 4.41 MIL/uL (ref 4.22–5.81)
RDW: 13.2 % (ref 11.5–15.5)
WBC: 6.4 10*3/uL (ref 4.0–10.5)
nRBC: 0 % (ref 0.0–0.2)

## 2023-06-04 LAB — TROPONIN I (HIGH SENSITIVITY)
Troponin I (High Sensitivity): 10 ng/L (ref <18)
Troponin I (High Sensitivity): 11 ng/L (ref <18)

## 2023-06-04 NOTE — ED Provider Notes (Signed)
[place] EMERGENCY DEPARTMENT AT [facility] Provider Note   CSN: [id number] Arrival date & time: 06/04/23  1527     History Chief Complaint  Patient presents with   Agitation    HPI Kyle Lewis is a 77 y.o. male presenting with his son for a myriad of complaints.  77 year old male with COPD hypertension hyperlipidemia.  Family endorses over the last week he has been more somnolent than normal during the day spending more time sleeping. This daughter-in-law who is a Publishing rights manager discovered that he was not wearing his BiPAP at all over the past few weeks because he does not like how it feels.  He has a history of similar presentations in the past.  He denies fevers chills nausea vomiting shortness of breath he is taking an antibiotic he had at home for potential UTI since he does have similar presentations but does not have active urinary symptoms at this time..Otherwise ambulatory tolerating p.o. intake and son states he is at his mental status baseline at this time.   Patient's recorded medical, surgical, social, medication list and allergies were reviewed in the Snapshot window as part of the initial history.   Review of Systems   Review of Systems  Constitutional:  Positive for fatigue. Negative for chills and fever.  HENT:  Negative for ear pain and sore throat.   Eyes:  Negative for pain and visual disturbance.  Respiratory:  Positive for shortness of breath. Negative for cough.   Cardiovascular:  Negative for chest pain and palpitations.  Gastrointestinal:  Negative for abdominal pain and vomiting.  Genitourinary:  Negative for dysuria and hematuria.  Musculoskeletal:  Negative for arthralgias and back pain.  Skin:  Negative for color change and rash.  Neurological:  Negative for seizures and syncope.  Psychiatric/Behavioral:  Positive for confusion.   All other systems reviewed and are negative.   Physical Exam Updated Vital Signs BP (!) 130/43    Pulse 70   Temp 97.9 F (36.6 C) (Oral)   Resp (!) 23   Ht 5\' 8"  (1.727 m)   Wt 99.8 kg   SpO2 98%   BMI 33.45 kg/m  Physical Exam Vitals and nursing note reviewed.  Constitutional:      General: He is not in acute distress.    Appearance: He is well-developed.  HENT:     Head: Normocephalic and atraumatic.  Eyes:     Conjunctiva/sclera: Conjunctivae normal.  Cardiovascular:     Rate and Rhythm: Normal rate and regular rhythm.     Heart sounds: No murmur heard. Pulmonary:     Effort: Pulmonary effort is normal. No respiratory distress.     Breath sounds: Normal breath sounds.  Abdominal:     Palpations: Abdomen is soft.     Tenderness: There is no abdominal tenderness.  Musculoskeletal:        General: No swelling.     Cervical back: Neck supple.  Skin:    General: Skin is warm and dry.     Capillary Refill: Capillary refill takes less than 2 seconds.  Neurological:     Mental Status: He is alert.  Psychiatric:        Mood and Affect: Mood normal.      ED Course/ Medical Decision Making/ A&P    Procedures Procedures   Medications Ordered in ED Medications - No data to display Medical Decision Making:   Kyle Lewis is a 77 y.o. male who presented to the ED  today with altered mental status detailed above.    Handoff received from EMS.  Additional history discussed with patient's family/caregivers.  Patient placed on continuous vitals and telemetry monitoring while in ED which was reviewed periodically.  Complete initial physical exam performed, notably the patient  was HDS in NAD.    Reviewed and confirmed nursing documentation for past medical history, family history, social history.    Initial Assessment:   With the patient's presentation of altered mental status, most likely diagnosis is delerium 2/2 infectious etiology (UTI/CAP/URI) vs metabolic abnormality (Na/K/Mg/Ca) vs nonspecific etiology. Other diagnoses were considered including (but not limited  to) CVA, ICH, intracranial mass, critical dehydration, heptatic dysfunction, uremia, hypercarbia, intoxication, endrocrine abnormality, toxidrome. These are considered less likely due to history of present illness and physical exam findings.   This is most consistent with an acute life/limb threatening illness complicated by underlying chronic conditions.  Initial Plan:  Screening labs including CBC and Metabolic panel to evaluate for infectious or metabolic etiology of disease.  Urinalysis with reflex culture ordered to evaluate for UTI or relevant urologic/nephrologic pathology.  CXR to evaluate for structural/infectious intrathoracic pathology.  Troponin/EKG to evaluate for cardiac pathology Objective evaluation as below reviewed   Initial Study Results:   Laboratory  All laboratory results reviewed without evidence of clinically relevant pathology.   EKG EKG was reviewed independently. Rate, rhythm, axis, intervals all examined and without medically relevant abnormality. ST segments without concerns for elevations.    Radiology:  All images reviewed independently. Agree with radiology report at this time.   DG Chest Portable 1 View Result Date: 06/04/2023 CLINICAL DATA:  Altered mental status EXAM: PORTABLE CHEST - 1 VIEW COMPARISON:  04/06/2022. FINDINGS: Cardiac silhouette is unremarkable. No pneumothorax or pleural effusion. Bibasilar prominence of the pulmonary interstitium consistent with edema, inflammation or scarring. Aorta is calcified. Osseous structures grossly unremarkable. IMPRESSION: Bibasilar interstitial prominence consistent with scarring, edema or subsegmental atelectasis. Electronically Signed   By: Layla Maw M.D.   On: 06/04/2023 17:28     Final Assessment and Plan:   History of present illness physical exam findings are not consistent with any acute pathology.  Agree with likely daytime somnolence secondary to nonadherence to BiPAP.  Likely causing some of his  worsening blood pressures and daytime dyspnea as well.  Given resolution of all symptoms and patient's return to normal function he is requesting discharge with his son at bedside who is in agreement with outpatient care management follow-up with PCP.  No other acute pathology detected at this time.  Patient stable for outpatient management.    Clinical Impression:  1. Confusion   2. OSA (obstructive sleep apnea)      Discharge   Final Clinical Impression(s) / ED Diagnoses Final diagnoses:  Confusion  OSA (obstructive sleep apnea)    Rx / DC Orders ED Discharge Orders     None         Glyn Ade, MD 06/04/23 2151

## 2023-06-04 NOTE — Discharge Instructions (Signed)
Wear your BiPAP every night as able.  This will decrease your daytime somnolence and confusion and improve your overall quality of life.

## 2023-06-04 NOTE — ED Triage Notes (Addendum)
Pt BIB [facility] from home due to agitation.  Pt wife reports patient has been "acting weird" and not like himself.  Pt currently being treated for UTI without diagnosing.  Pt does have small skin tear on forehead.   Pt baseline 2L oxygen.   [facility] VS BP 128/84, HR 72, SpO2 94%

## 2023-06-04 NOTE — ED Notes (Signed)
Pt given coffee as requested, son at bedside, update given

## 2023-06-04 NOTE — ED Notes (Signed)
Sol Blazing [phone].  Partner would like updates.

## 2023-06-05 ENCOUNTER — Telehealth: Payer: Self-pay | Admitting: Primary Care

## 2023-06-05 DIAGNOSIS — G4733 Obstructive sleep apnea (adult) (pediatric): Secondary | ICD-10-CM

## 2023-06-05 NOTE — Telephone Encounter (Signed)
 Received call from patients wife.  Patient is on BiPAP for OSA.  Wife reports patient has been waking up confused at night recently when wearing BIPAP, feels is suffocating when wearing mask. He was seen in ED yesterday for confusion but not admitted.  Basic labs and UA were unremarkable. She tells me that he sleeps much better when not wearing PAP therapy. Advised he wear oxygen at night when not using BIPAP and sleep at incline.  Recommend getting in-lab titration study to reassess OSA and pressure settings (ordered)  Can we refer to sleep doctor, please schedule first available with either Dr. Neda or Dr. Neysa

## 2023-06-08 ENCOUNTER — Telehealth: Payer: Self-pay | Admitting: Primary Care

## 2023-06-08 NOTE — Telephone Encounter (Signed)
 Bjorn Loser wife states patient going to sleep doctor in [place]. Has appointment 06/12/2023. Would like sleep study moved to a sooner appointment. Rhonda phone number is [phone].

## 2023-06-08 NOTE — Telephone Encounter (Signed)
 Routing to front desk pool to schedule appt for sleep consult

## 2023-06-11 NOTE — Telephone Encounter (Signed)
 Kyle Lewis wife states cancelled appointment with [facility] sleep doctor. Would like sooner appointment with sleep doctor at our office. . Also for sleep study. Patient currently not using CPAP machine. Kyle Lewis phone number is [phone].

## 2023-06-11 NOTE — Telephone Encounter (Signed)
 Pt has been called and made aware   Pt has been rescheduled to 1/9   Nothing further needed

## 2023-06-11 NOTE — Telephone Encounter (Signed)
 Called and spoke with pt about Cpap titration study. Was wondering if she can get a earlier date due to bipap setting not being accurate and pt waking up cloudy. [facility] please advise if the sleep center has any earlier availability

## 2023-06-11 NOTE — Telephone Encounter (Signed)
ATC patient.  No answer and mailbox is currently full.

## 2023-06-13 ENCOUNTER — Other Ambulatory Visit: Payer: Self-pay | Admitting: Family Medicine

## 2023-06-13 DIAGNOSIS — R4182 Altered mental status, unspecified: Secondary | ICD-10-CM

## 2023-06-14 ENCOUNTER — Ambulatory Visit (HOSPITAL_BASED_OUTPATIENT_CLINIC_OR_DEPARTMENT_OTHER): Payer: Medicare Other | Attending: Primary Care | Admitting: Internal Medicine

## 2023-06-14 DIAGNOSIS — G4763 Sleep related bruxism: Secondary | ICD-10-CM | POA: Diagnosis not present

## 2023-06-14 DIAGNOSIS — G4733 Obstructive sleep apnea (adult) (pediatric): Secondary | ICD-10-CM | POA: Diagnosis present

## 2023-06-16 NOTE — Procedures (Signed)
 Patient Name: Kyle Lewis, Kyle Lewis Date: 06/14/2023 Gender: Male D.O.B: 02/21/1946 Age (years): 87 Referring Provider: Almarie Ferrari NP Height (inches): 68 Interpreting Physician: Reggy Salt MD, ABSM Weight (lbs): 220 RPSGT: Primus Norris BMI: 33 MRN: <br> Neck Size: 18.50  CLINICAL INFORMATION The patient is referred for a BiPAP titration to treat sleep apnea.  Date of NPSG, Split Night or HST: 10/06/17  NPSG  AHI 84.6/hr, mean O2 saturation 89.3%, body weight 236 lbs  SLEEP STUDY TECHNIQUE As per the AASM Manual for the Scoring of Sleep and Associated Events v2.3 (April 2016) with a hypopnea requiring 4% desaturations.  The channels recorded and monitored were frontal, central and occipital EEG, electrooculogram (EOG), submentalis EMG (chin), nasal and oral airflow, thoracic and abdominal wall motion, anterior tibialis EMG, snore microphone, electrocardiogram, and pulse oximetry. Bilevel positive airway pressure (BPAP) was initiated at the beginning of the study and titrated to treat sleep-disordered breathing.  MEDICATIONS Medications self-administered by patient taken the night of the study : OXYCODONE  HCL  RESPIRATORY PARAMETERS Optimal IPAP Pressure (cm): 20 AHI at Optimal Pressure (/hr) 0 Optimal EPAP Pressure (cm): 16   Overall Minimal O2 (%): 83.0 Minimal O2 at Optimal Pressure (%): 87.0 SLEEP ARCHITECTURE Start Time: 9:57:18 PM Stop Time: 4:13:02 AM Total Time (min): 375.7 Total Sleep Time (min): 242.5 Sleep Latency (min): 81.0 Sleep Efficiency (%): 64.5% REM Latency (min): 53.5 WASO (min): 52.3 Stage N1 (%): 8.5% Stage N2 (%): 39.0% Stage N3 (%): 12.8% Stage R (%): 39.8 Supine (%): 49.53 Arousal Index (/hr): 14.8   CARDIAC DATA The 2 lead EKG demonstrated sinus rhythm. The mean heart rate was 56.9 beats per minute. Other EKG findings include: None.  LEG MOVEMENT DATA The total Periodic Limb Movements of Sleep (PLMS) were 0. The PLMS index was 0.0. A PLMS  index of <15 is considered normal in adults.  IMPRESSIONS - CPAP titration did not provide adequate control at tolerated pressure and was changed to BIPAP titration. - An optimal BIPAP pressure was selected for this patient ( 20 /16 cm of water) - Central sleep apnea was not noted during this titration (CAI = 0.5/h). - Moderate oxygen desaturations were observed during this titration (min O2 = 83.0%). Supplemental O2 at 1L added per protocol at 01:48AM due to sustained O2 saturation in low 80%'s. On BIPAP 20/16, with O2 1L, minimum O2 saturation 87%, Mean 91%. - No snoring was audible during this study. - No cardiac abnormalities were observed during this study. - Clinically significant periodic limb movements were not noted during this study. Arousals associated with PLMs were rare. - Bruxism noted. - Sleep talking noted. - Patient had difficulty initiating sleep, then requested to end study early due to discomfort with equipment/ PAP intolerance.  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 - Patient wore a Medium size Resmed Full Face AirFit F20 mask and heated humidification. - Consider an oral guard for Bruxism. - Evaluate need for supplemental O2 on BIPAP. - Consider need for sleep aid to improve BIPAP tolerance. - Sleep hygiene should be reviewed to assess factors that may improve sleep quality. - Weight management and regular exercise should be initiated or continued.  [Electronically signed] 06/16/2023 01:31 PM  Reggy Salt MD, ABSM Diplomate, American Board of Sleep Medicine NPI: [id number]  Reggy Salt Diplomate, Biomedical Engineer of Sleep Medicine  ELECTRONICALLY SIGNED ON:  06/16/2023, 1:17 PM [facility] PH: [phone]   FX: [phone] ACCREDITED BY THE AMERICAN ACADEMY OF SLEEP MEDICINE

## 2023-06-19 ENCOUNTER — Ambulatory Visit: Payer: Medicare Other | Admitting: Podiatry

## 2023-06-20 ENCOUNTER — Telehealth: Payer: Self-pay | Admitting: Primary Care

## 2023-06-20 DIAGNOSIS — J9601 Acute respiratory failure with hypoxia: Secondary | ICD-10-CM

## 2023-06-20 DIAGNOSIS — J9611 Chronic respiratory failure with hypoxia: Secondary | ICD-10-CM

## 2023-06-20 DIAGNOSIS — G4733 Obstructive sleep apnea (adult) (pediatric): Secondary | ICD-10-CM

## 2023-06-20 NOTE — Telephone Encounter (Signed)
 Patient needs settings on Bipap machine to be adjusted. Wife states that he's getting too much oxygen or too much carbon because he is waking up confused. Please call and advise [phone]. Sending back as high priority due to the behavior responses he is having from lack of proper sleep.

## 2023-06-21 NOTE — Telephone Encounter (Signed)
Patient's wife is trying to return missed call. They need for the pressure settings on the bipap machine to be sent to [facility] for adjustment.

## 2023-06-21 NOTE — Telephone Encounter (Signed)
 ATC pt no answer, lvmm for pt to give the office a call back.

## 2023-06-21 NOTE — Telephone Encounter (Signed)
Atc pt no answer, left detailed message. Will await call back tomorrow.

## 2023-06-22 NOTE — Telephone Encounter (Signed)
Pt states her husband is confused and has been off the bipap due to pressure settings. Pt did a cpap titration study 06/15/23 can we read these results. Pts wife is requesting for settings to be changed on bipap. Dr.Young or Dr.Olalere can we please advise

## 2023-06-25 ENCOUNTER — Encounter: Payer: Self-pay | Admitting: Pulmonary Disease

## 2023-06-25 NOTE — Telephone Encounter (Signed)
Called and spoke with pts wife with bipap settings. Pt already has a bipap, once settings are changed pt will begin using again. ONO placed.   Pts wife wonders if pt can use bipap during the day as well or only at night ? Dr.O please advise

## 2023-06-25 NOTE — Telephone Encounter (Signed)
Can place BiPAP order  Settings of 20/16 Patient wore a Medium size Resmed Full Face AirFit F20 mask and heated humidification  add 2 L of oxygen to system(study was performed with 1 L but patient was still desaturating significantly)  Schedule for overnight oximetry with BiPAP and oxygen in place in about 1- 2 following starting BiPAP to ascertain adequate saturations  Early follow-up following initiation of BiPAP

## 2023-06-25 NOTE — Telephone Encounter (Signed)
 I called the pt's spouse and there was no answer- LMTCB.

## 2023-06-26 NOTE — Telephone Encounter (Signed)
Called and advised pt of Dr.Olalere recommendations. Pts wife verbalized understanding nfn

## 2023-07-03 ENCOUNTER — Telehealth: Payer: Self-pay | Admitting: Primary Care

## 2023-07-03 ENCOUNTER — Encounter: Payer: Self-pay | Admitting: Neurology

## 2023-07-03 NOTE — Telephone Encounter (Signed)
Wife states that Bipap is not working for her husband. She states that her husband (the patient) is interested in using inspire since he's been having a hard time using his bipap machine(mask doesn't fit, sometimes he gets too much air and sometimes not enough etc.). Please call and advise [phone]. If possible she would like to speak with Tammy Np about it.

## 2023-07-25 ENCOUNTER — Encounter (HOSPITAL_BASED_OUTPATIENT_CLINIC_OR_DEPARTMENT_OTHER): Payer: Medicare Other | Admitting: Internal Medicine

## 2023-08-06 ENCOUNTER — Ambulatory Visit
Admission: RE | Admit: 2023-08-06 | Discharge: 2023-08-06 | Disposition: A | Discharge status: Home / Self Care | Payer: Medicare Other | Source: Ambulatory Visit | Attending: Family Medicine | Admitting: Family Medicine

## 2023-08-06 DIAGNOSIS — R4182 Altered mental status, unspecified: Secondary | ICD-10-CM

## 2023-08-06 MED ORDER — GADOPICLENOL 0.5 MMOL/ML IV SOLN
10.0000 mL | Freq: Once | INTRAVENOUS | Status: AC | PRN
  Administered 2023-08-06: 10 mL via INTRAVENOUS

## 2023-08-10 ENCOUNTER — Telehealth: Payer: Self-pay | Admitting: Primary Care

## 2023-08-10 ENCOUNTER — Telehealth: Payer: Medicare Other | Admitting: Primary Care

## 2023-08-10 DIAGNOSIS — G4733 Obstructive sleep apnea (adult) (pediatric): Secondary | ICD-10-CM

## 2023-08-10 DIAGNOSIS — F17210 Nicotine dependence, cigarettes, uncomplicated: Secondary | ICD-10-CM | POA: Diagnosis not present

## 2023-08-10 NOTE — Telephone Encounter (Signed)
 error

## 2023-08-10 NOTE — Progress Notes (Signed)
 Virtual Visit via Video Note  I connected with Kyle Lewis on 08/10/23 at  3:00 PM EST by a video enabled telemedicine application and verified that I am speaking with the correct person using two identifiers.  Location: Patient: Home Provider: Office    I discussed the limitations of evaluation and management by telemedicine and the availability of in person appointments. The patient expressed understanding and agreed to proceed.  History of Present Illness: 78 year old male, current every day smoker (30+ pack year hx). PMH significant for OSA, moderate COPD, insomnia, aortic stenosis, PVD, stroke, CAD, hypertension, AAA repair, osteoarthritis, lumbar spinal fusion, BPH. Patient of Dr. Kirke Corin.  NPSG 10/06/17 showed AHI 84.6/ hr. BIPAP Max IPAP 25, Min EPAP 5, Min PS 4.  DME [facility]   Previous [facility] pulmonary encounter:  02/24/2019 Patient presents today for 6 month OSA follow-up. He is doing well, reports compliance with BIPAP everynight. He is sleeping well. No issues with pressure setting or mask. Breathing is at baseline. Occasional shortness of breath when walking long distanced. Reports some cough, brings up clear mucus. Seldom wheezing. He is not on a maintenance inhaler and does not have rescue inhaler. Works part time delivering school buses. States that he could not get physical renewed for more than 3 months at a time. DME is [facility].    Airview download -100% compliant with use - Averaging 8 hours - AHI 2.3.   06/23/21- Dr. Darnelle Bos f/u ov/Wert re: GOLD 2 just quit smoking 05/2021  maint on no rx at all      Chief Complaint  Patient presents with   Follow-up      PHOS-sob better,occass. Wheeze,not using oxygen at home   Dyspnea:  just walking inside  Cough: none  Sleeping: on bipap s 02 / electric bed  10 degrees / doing fine on present setting SABA use: has it but not using /  02: not using  Covid status:   vax x 2      No obvious day to day or daytime  variability or assoc excess/ purulent sputum or mucus plugs or hemoptysis or cp or chest tightness, subjective wheeze or overt sinus or hb symptoms.    Sleeping as abovde  without nocturnal  or early am exacerbation  of respiratory  c/o's or need for noct saba. Also denies any obvious fluctuation of symptoms with weather or environmental changes or other aggravating or alleviating factors except as outlined above    No unusual exposure hx or h/o childhood pna/ asthma or knowledge of premature birth.  06/05/23- Telephone encounter  Received call from patients wife.  Patient is on BiPAP for OSA.  Wife reports patient has been waking up confused at night recently when wearing BIPAP, feels is suffocating when wearing mask. He was seen in ED yesterday for confusion but not admitted.  Basic labs and UA were unremarkable. She tells me that he sleeps much better when not wearing PAP therapy. Advised he wear oxygen at night when not using BIPAP and sleep at incline.  Recommend getting in-lab titration study to reassess OSA and pressure settings (ordered)  Can we refer to sleep doctor, please schedule first available with either Dr. Wynona Neat or Dr. Maple Hudson    08/10/2023- Interim hx  Discussed the use of AI scribe software for clinical note transcription with the patient, who gave verbal consent to proceed.  History of Present Illness   The patient is a 77 year old with sleep apnea who presents  with difficulty tolerating BiPAP therapy. He is accompanied by his wife. Unable to connect to video portion of the call, transitioned to televisit.    - CPAP titration did not provide adequate control at tolerated pressure and was changed to BIPAP titration. - An optimal BIPAP pressure was selected for this patient ( 20 /16 cm of water) - Central sleep apnea was not noted during this titration (CAI = 0.5/h). - Moderate oxygen desaturations were observed during this titration (min O2 = 83.0%). Supplemental O2 at 1L added  per protocol at 01:48AM due to sustained O2 saturation in low 80%'s. On BIPAP 20/16, with O2 1L, minimum O2 saturation 87%, Mean 91%.  He has a history of sleep apnea and has been using a BiPAP machine. Recently, he underwent a titration study in the lab due to waking up at night feeling confused and suffocated while wearing the BiPAP mask. Despite adjustments, he continues to have difficulty tolerating the BiPAP, using it five to six nights a week for about five to six hours per night. The mask often feels uncomfortable and does not fit well, leading to issues such as the mask coming off or making noise.  There have been persistent issues with the fit of the BiPAP mask. He has a broad nose and a small bridge between his eyes and nose, making it difficult for the mask to fit properly. He has tried both medium and large sized full face masks, but neither seems to fit well, often coming off or leaking, particularly on the right side. Previously, he used a nasal mask but had issues with mouth breathing.  He has been identified as a mouth breather, which complicates the use of a nasal mask. However, he tolerated the nasal mask better than the full face mask. He uses 2L oxygen at night with the BiPAP at a rate of 2.5 liters. He has been prescribed Xanax, taking half of a 0.5 mg tablet at night to help with anxiety related to the mask. He also takes amlodipine and metoprolol at night to manage his blood pressure, as taking them in the morning causes him to feel fatigued in the afternoon.      Observations/Objective:   NPSG AHI 84.6/hr, mean O2 saturation 89.3%, body weight 236 lbs   Assessment and Plan:  1. OSA (obstructive sleep apnea) (Primary)     Obstructive Sleep Apnea Patient has difficulty tolerating BiPAP, waking up at night feeling suffocated. He underwent a recent titration study, optimal pressure found to be 20/16 with 2L oxygen. Patient currently using BiPAP 5-6 nights per week for 5-6  hours per night. Patient also uses supplemental oxygen at 2.5 liters with BiPAP. Patient has expressed interest in Inspire device, but candidacy is uncertain and requires further discussion with colleagues and additional workup including sleep endoscopy. -Request compliance report from [facility] to assess current BiPAP usage and settings. -Consider trial of nasal mask with chin strap for improved comfort and tolerance. -Continue Xanax 0.25mg  (half of 0.5mg  tablet) at bedtime for anxiety. -We discussed alterative treatment options today, unclear if he would be a good candidate for Inspire due to age and intermittent confusion, however, wife could likely help use device.   -Follow-up in 2-3 weeks to reassess BiPAP tolerance and discuss potential Inspire candidacy.      Follow Up Instructions:    I discussed the assessment and treatment plan with the patient. The patient was provided an opportunity to ask questions and all were answered. The patient agreed with the  plan and demonstrated an understanding of the instructions.   The patient was advised to call back or seek an in-person evaluation if the symptoms worsen or if the condition fails to improve as anticipated.  I provided 28 minutes of non-face-to-face time during this encounter.   Glenford Bayley, NP

## 2023-08-10 NOTE — Patient Instructions (Signed)
-  OBSTRUCTIVE SLEEP APNEA: Obstructive sleep apnea is a condition where your airway becomes blocked during sleep, causing breathing interruptions. We understand that you are having trouble with the BiPAP machine, feeling suffocated at night, and experiencing mask discomfort. We will request a compliance report from [facility] to check your current BiPAP usage and settings. We suggest trying a nasal mask with a chin strap to see if it improves your comfort. Continue taking Xanax 0.25mg  at bedtime to help with anxiety related to the mask. We will also consider the Inspire device as a potential treatment option, but this requires further discussion and additional tests, including a sleep endoscopy. Please follow up in 2-3 weeks to reassess your BiPAP tolerance and discuss the Inspire device further.  INSTRUCTIONS:  Please follow up in 2-3 weeks to reassess your BiPAP tolerance and discuss the potential candidacy for the [facility] device. We will also review the compliance report from [facility] at that time.

## 2023-08-10 NOTE — Telephone Encounter (Addendum)
 I need you to contact [facility] for BIPAP download. Please also schedule fu in 3-4 weeks

## 2023-08-13 NOTE — Telephone Encounter (Signed)
 I have an Oceanographer. Routing to front desk for scheduling.

## 2023-08-13 NOTE — Telephone Encounter (Signed)
 I called [facility] and spoke with Fritzi Mandes. Kirsten was able to find a report and will fax it to [facility]. NFN

## 2023-08-14 NOTE — Telephone Encounter (Signed)
  Airview download 07/14/2023 - 08/12/2023 Usage days 23/30 days (77%) Average usage per night 4 hours 53 minutes Pressure Max IPAP 20/min EPAP 16/pressure support 4 cm H2O Air leaks median 10.5 L/min; 95th percentile 40.4 L/min AHI 7.2

## 2023-08-22 NOTE — Telephone Encounter (Signed)
 [address] [place], [place] Donaldson; [place], Terri; [place], [place]; [place], [place]; [place], Shelton C, LPN; 1 other This pt is not eligible until may for a mask change. We are going to advise his wife. She can purchase through [place] if she prefers however insurance wont pay till may

## 2023-08-22 NOTE — Telephone Encounter (Signed)
 fyi

## 2023-08-22 NOTE — Telephone Encounter (Signed)
 Thanks so much.

## 2023-08-28 ENCOUNTER — Ambulatory Visit: Attending: Physician Assistant | Admitting: Physician Assistant

## 2023-08-28 VITALS — BP 149/78 | HR 61

## 2023-08-28 DIAGNOSIS — Z952 Presence of prosthetic heart valve: Secondary | ICD-10-CM

## 2023-08-28 DIAGNOSIS — R0609 Other forms of dyspnea: Secondary | ICD-10-CM | POA: Diagnosis present

## 2023-08-28 DIAGNOSIS — Z01818 Encounter for other preprocedural examination: Secondary | ICD-10-CM

## 2023-08-28 DIAGNOSIS — I2510 Atherosclerotic heart disease of native coronary artery without angina pectoris: Secondary | ICD-10-CM

## 2023-08-28 NOTE — Progress Notes (Unsigned)
 Cardiology Office Note:  .   Date:  08/30/2023  ID:  Kyle Lewis, DOB [birth year], MRN [id number] PCP: Aviva Kluver  [facility] Providers Cardiologist:  Garwin Brothers, MD     History of Present Illness: .   Kyle Lewis is a 78 y.o. male with PMH of HTN, carotid artery disease with known R ICA occlusion and moderate LICA stenosis, right MCA stroke, nonobstructive CAD, severe aortic stenosis s/p TAVR, TAVR endocarditis, MR, AAA s/p repair 2016, chronic diastolic heart failure, OSA on BiPAP, COPD on O2 and pulmonary nodule noted on CT imaging 2023.  He was admitted in December 2022 with acute respiratory failure and found to have severe aortic stenosis.  He eventually underwent TAVR procedure by Dr. Laneta Lewis on 06/07/2021.  A coronary CTA in July 2023 revealed calcium score 1489, which placed the patient at 85th percentile for age and sex matched control, the study was done without use of nitroglycerin and was not sufficient to evaluate coronary artery stenosis, stable TAVR valve, no evidence of aortic abscess.  4 mm right middle lobe lung nodule.  Echocardiogram in February 2024 showed EF 60 to 65%, moderate concentric LVH, grade 1 DD, moderate LAE, trivial MR, repair/replace the aortic valve with mean gradient 13 mmHg.  He was last seen by Janet Berlin, NP on 10/26/2022 at which time he was doing well.  He has a left renal carcinoma being followed by urology service.  This was seen on last CT of abdomen in June 2024.  He was seen in the ED in December 2024 for  increased somnolence.  Daughter who is a Publishing rights manager mention that he has not been wearing the BiPAP therapy at home.  There was no acute finding to explain the somnolence.  It was felt the somnolence was more related to nonadherence to BiPAP therapy.  Obstructive sleep apnea is being managed by pulmonology service.  Patient's functional ability is quite limited.  He still does not tolerate BiPAP therapy.  This will need to be  followed by pulmonology service.  He required left shoulder surgery.  He is overdue for repeat echocardiogram given his TAVR history.  I recommended repeat echocardiogram and a Lexiscan Myoview.  He denies any chest pain however he gets short of breath with minimal activity.  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  Risk and the benefit of the Lexiscan Myoview has been explained to the patient who is agreeable to proceed.  If study are normal, he can follow-up in 6 months.  ROS:   He denies chest pain, palpitations, dyspnea, pnd, orthopnea, n, v, dizziness, syncope, edema, weight gain, or early satiety. All other systems reviewed and are otherwise negative except as noted above.    Studies Reviewed: .        Cardiac Studies & Procedures   ______________________________________________________________________________________________ CARDIAC CATHETERIZATION  CARDIAC CATHETERIZATION 05/31/2021  Narrative   Prox LAD lesion is 25% stenosed.   Dist Cx lesion is 40% stenosed.   Ost 2nd Mrg to 2nd Mrg lesion is 70% stenosed.   Dist RCA lesion is 60% stenosed.   Ost RPDA to RPDA lesion is 40% stenosed.  The LAD is a large caliber vessel that courses to the apex. There is mild mid LAD stenosis The Circumflex is a large caliber vessel that gives off a small obtuse marginal branch then 2 moderate caliber obtuse marginal branches. The second obtuse marginal branch has a moderate stenosis, unchanged from last cath  in 2019. The RCA is a large dominant artery. The distal RCA has a moderate stenosis, unchanged from last cath in 2019. Severe aortic stenosis (mean gradient 50.6 mmHg, 59 mmHg, AVA 0.93 cm2).  Recommendations: Will continue workup for TAVR. His right and left heart pressures are only mildly abnormal following inpatient diuresis. Continue diuresis. The structural heart team will provide a formal consultation regarding his candidacy for  TAVR.  Findings Coronary Findings Diagnostic  Dominance: Right  Left Main Vessel is large. Vessel is angiographically normal.  Left Anterior Descending The vessel exhibits minimal luminal irregularities. Prox LAD lesion is 25% stenosed.  First Diagonal Branch Vessel is moderate in size.  Second Diagonal Branch Vessel is small in size.  Third Diagonal Branch Vessel is small in size.  Ramus Intermedius Vessel is moderate in size. Vessel is angiographically normal.  Left Circumflex Vessel is large. Dist Cx lesion is 40% stenosed. The lesion is irregular.  First Obtuse Marginal Branch Vessel is small in size.  Second Obtuse Marginal Branch Vessel is moderate in size. Ost 2nd Mrg to 2nd Mrg lesion is 70% stenosed. The lesion is focal and eccentric.  Third Obtuse Marginal Branch Vessel is small in size. Vessel is angiographically normal.  Right Coronary Artery Vessel is large. The vessel is moderately tortuous. Dist RCA lesion is 60% stenosed.  Right Posterior Descending Artery Vessel is moderate in size. Ost RPDA to RPDA lesion is 40% stenosed.  Right Posterior Atrioventricular Artery Vessel is small in size.  Intervention  No interventions have been documented.   CARDIAC CATHETERIZATION  CARDIAC CATHETERIZATION 01/24/2018  Narrative Conclusions: 1. Stable appearance of coronary arteries since 09/2016.  Moderate to severe disease involving the distal RCA/rPDA and OM2 is not significantly changed and involves relatively small vessels.  Recent troponin elevation in [place] was likely due to supply-demand mismatch in the setting of heat stroke/hyperthermia, moderate aortic stenosis, and moderate to severe CAD involving small branches. 2. Upper normal to mildly elevated left and right heart filling pressures. 3. Normal Fick cardiac output/index. 4. Moderate aortic stenosis.  Recommendations: 1. Continue medical therapy.  Given minimal symptoms and stable  disease since prior catheterization in 09/2016, I do not recommend PCI to small to moderate sized OM2 and rPDA branches at this time. 2. Obtain outpatient echo to reassess aortic valve, given discrepant findings between recent outside echo (severe AS) and today's catheterization. 3. Patient wishes to follow-up with Dr. Tomie China in [place]; we will arrange for appointment in ~2 weeks.  If his symptoms remain stable or improve and echo confirms non-critical aortic stenosis, I think it is reasonable for Mr. Oyola to begin driving again.  Recommend continuation of clopidogrel 75 mg daily for moderate CAD and history of AAA status post endovascular repair.  Yvonne Kendall, MD [facility] Pager: [phone]  Findings Coronary Findings Diagnostic  Dominance: Right  Left Main Vessel is large. Vessel is angiographically normal.  Left Anterior Descending The vessel exhibits minimal luminal irregularities. Prox LAD lesion is 25% stenosed.  First Diagonal Branch Vessel is moderate in size.  Second Diagonal Branch Vessel is small in size.  Third Diagonal Branch Vessel is small in size.  Ramus Intermedius Vessel is moderate in size. Vessel is angiographically normal.  Left Circumflex Vessel is large. Dist Cx lesion is 40% stenosed. The lesion is irregular.  First Obtuse Marginal Branch Vessel is small in size.  Second Obtuse Marginal Branch Vessel is moderate in size. 2nd Mrg lesion is 70% stenosed. The lesion is focal  and eccentric.  Third Obtuse Marginal Branch Vessel is small in size. The vessel exhibits minimal luminal irregularities.  Right Coronary Artery Vessel is large. The vessel is moderately tortuous. Dist RCA lesion is 50% stenosed.  Right Posterior Descending Artery Vessel is moderate in size. RPDA lesion is 60% stenosed. The lesion is discrete.  Right Posterior Atrioventricular Artery Vessel is small in size.  Intervention  No interventions have been  documented.   STRESS TESTS  MYOCARDIAL PERFUSION IMAGING 09/04/2016  Narrative  Nuclear stress EF: 56%.  No T wave inversion was noted during stress.  There was no ST segment deviation noted during stress.  Defect 1: There is a medium defect of moderate severity.  Findings consistent with ischemia.  This is an intermediate risk study.  Large size, moderate intensity partially reversible (SDS 4) inferior, inferoseptal, anteroseptal and inferolateral perfusion defect, suggestive of ischemia or less likely attenuation artifact. There is underlying bowel which attenuates. LVEF 56% with normal wall motion. Clinical correlation is advised. This is an intermediate risk study.   ECHOCARDIOGRAM  ECHOCARDIOGRAM COMPLETE 08/02/2022  Narrative ECHOCARDIOGRAM REPORT    Patient Name:   FRANCISO DIERKS Date of Exam: 08/02/2022 Medical Rec #:  [id number]       Height:       66.0 in Accession #:    [id number]      Weight:       220.0 lb Date of Birth:  14-Feb-1946      BSA:          2.082 m Patient Age:    76 years        BP:           158/89 mmHg Patient Gender: M               HR:           64 bpm. Exam Location:  [address]  Procedure: 2D Echo, Cardiac Doppler and Color Doppler  Indications:    Z95 S/p TAVR  History:        Patient has prior history of Echocardiogram examinations, most recent 02/03/2022. CAD, Stroke, Carotid Disease and AAA, Signs/Symptoms:Murmur; Risk Factors:Hypertension, HLD and Dyslipidemia.  Sonographer:    Clearence Ped RCS Referring Phys: [phone] JILL D MCDANIEL  IMPRESSIONS   1. Left ventricular ejection fraction, by estimation, is 60 to 65%. The left ventricle has normal function. The left ventricle has no regional wall motion abnormalities. There is moderate concentric left ventricular hypertrophy. Left ventricular diastolic parameters are consistent with Grade I diastolic dysfunction (impaired relaxation). 2. Right ventricular systolic function is  normal. The right ventricular size is normal. There is normal pulmonary artery systolic pressure. 3. Left atrial size was moderately dilated. 4. The mitral valve is normal in structure. Trivial mitral valve regurgitation. No evidence of mitral stenosis. 5. The aortic valve has been repaired/replaced. Aortic valve regurgitation is not visualized. No aortic stenosis is present. Aortic valve mean gradient measures 13.0 mmHg. Aortic valve Vmax measures 2.41 m/s. 6. The inferior vena cava is normal in size with greater than 50% respiratory variability, suggesting right atrial pressure of 3 mmHg.  FINDINGS Left Ventricle: Left ventricular ejection fraction, by estimation, is 60 to 65%. The left ventricle has normal function. The left ventricle has no regional wall motion abnormalities. The left ventricular internal cavity size was normal in size. There is moderate concentric left ventricular hypertrophy. Left ventricular diastolic parameters are consistent with Grade I diastolic dysfunction (impaired relaxation).  Right Ventricle: The  right ventricular size is normal. No increase in right ventricular wall thickness. Right ventricular systolic function is normal. There is normal pulmonary artery systolic pressure. The tricuspid regurgitant velocity is 2.33 m/s, and with an assumed right atrial pressure of 3 mmHg, the estimated right ventricular systolic pressure is 24.7 mmHg.  Left Atrium: Left atrial size was moderately dilated.  Right Atrium: Right atrial size was normal in size.  Pericardium: There is no evidence of pericardial effusion.  Mitral Valve: The mitral valve is normal in structure. There is mild thickening of the mitral valve leaflet(s). Trivial mitral valve regurgitation. No evidence of mitral valve stenosis.  Tricuspid Valve: The tricuspid valve is normal in structure. Tricuspid valve regurgitation is trivial. No evidence of tricuspid stenosis.  Aortic Valve: The aortic valve has been  repaired/replaced. Aortic valve regurgitation is not visualized. No aortic stenosis is present. Aortic valve mean gradient measures 13.0 mmHg. Aortic valve peak gradient measures 23.2 mmHg. Aortic valve area, by VTI measures 1.63 cm. 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  Pulmonic Valve: The pulmonic valve was not well visualized. Pulmonic valve regurgitation is not visualized. No evidence of pulmonic stenosis.  Aorta: The aortic root is normal in size and structure.  Venous: The inferior vena cava is normal in size with greater than 50% respiratory variability, suggesting right atrial pressure of 3 mmHg.  IAS/Shunts: No atrial level shunt detected by color flow Doppler.   LEFT VENTRICLE PLAX 2D LVIDd:         4.20 cm   Diastology LVIDs:         3.00 cm   LV e' medial:    6.96 cm/s LV PW:         1.40 cm   LV E/e' medial:  14.2 LV IVS:        1.40 cm   LV e' lateral:   8.49 cm/s LVOT diam:     2.10 cm   LV E/e' lateral: 11.7 LV SV:         94 LV SV Index:   45 LVOT Area:     3.46 cm   RIGHT VENTRICLE RV Basal diam:  3.30 cm RV S prime:     17.00 cm/s TAPSE (M-mode): 2.3 cm  LEFT ATRIUM             Index        RIGHT ATRIUM           Index LA diam:        4.50 cm 2.16 cm/m   RA Area:     20.50 cm LA Vol (A2C):   45.5 ml 21.85 ml/m  RA Volume:   56.00 ml  26.89 ml/m LA Vol (A4C):   44.2 ml 21.22 ml/m LA Biplane Vol: 45.0 ml 21.61 ml/m AORTIC VALVE AV Area (Vmax):    1.55 cm AV Area (Vmean):   1.37 cm AV Area (VTI):     1.63 cm AV Vmax:           241.00 cm/s AV Vmean:          175.000 cm/s AV VTI:            0.572 m AV Peak Grad:      23.2 mmHg AV Mean Grad:      13.0 mmHg LVOT Vmax:         108.00 cm/s LVOT Vmean:        69.200 cm/s LVOT  VTI:          0.270 m LVOT/AV VTI ratio: 0.47  AORTA Ao Root diam: 2.80 cm Ao Asc diam:  2.60 cm  MITRAL VALVE                TRICUSPID VALVE MV Area (PHT):               TR Peak grad:   21.7 mmHg MV Decel Time:              TR Vmax:        233.00 cm/s MV E velocity: 99.00 cm/s   TR Vmean:       300.0 cm/s MV A velocity: 122.00 cm/s MV E/A ratio:  0.81         SHUNTS Systemic VTI:  0.27 m Systemic Diam: 2.10 cm  Arvilla Meres MD Electronically signed by Arvilla Meres MD Signature Date/Time: 08/02/2022/4:32:36 PM    Final   TEE  ECHO TEE 12/08/2021  Narrative TRANSESOPHOGEAL ECHO REPORT    Patient Name:   DMARION PERFECT Date of Exam: 12/08/2021 Medical Rec #:  [id number]       Height:       68.0 in Accession #:    [id number]      Weight:       218.9 lb Date of Birth:  11/02/1945      BSA:          2.124 m Patient Age:    75 years        BP:           91/42 mmHg Patient Gender: M               HR:           66 bpm. Exam Location:  Inpatient  Procedure: Transesophageal Echo, Color Doppler, 3D Echo and Cardiac Doppler  Indications:     bacteremia  History:         Patient has prior history of Echocardiogram examinations, most recent 12/06/2021. COPD; Risk Factors:Hypertension, Dyslipidemia, Former Smoker and Sleep Apnea. Aortic Valve: 29 mm Edwards TAVR valve is present in the aortic position.  Sonographer:     Delcie Roch RDCS Referring Phys:  [id number] Cyndi Bender Diagnosing Phys: Thomasene Ripple DO  PROCEDURE: After discussion of the risks and benefits of a TEE, an informed consent was obtained from the patient. The transesophogeal probe was passed without difficulty through the esophogus of the patient. Imaged were obtained with the patient in a left lateral decubitus position. Local oropharyngeal anesthetic was provided with viscous lidocaine. Sedation performed by different physician. Image quality was adequate. The patient developed no complications during the procedure.  IMPRESSIONS   1. There is a highly mobile mass on the base of the left coronary cusp highly suspicious for a vegetation. No appreciation of an abcess. Consider  cardiac CTA if need for further evaluation. The aortic valve has been repaired/replaced. Aortic valve regurgitation is trivial. No aortic stenosis is present. There is a 29 mm Edwards TAVR valve present in the aortic position. 2. Left ventricular ejection fraction, by estimation, is 60 to 65%. The left ventricle has normal function. 3. Right ventricular systolic function is normal. The right ventricular size is normal. 4. No left atrial/left atrial appendage thrombus was detected. The LAA emptying velocity was 59 cm/s. 5. The mitral valve is normal in structure. Mild mitral valve regurgitation. 6. Tricuspid leaflets appears to be mildly thickened. 7. There is mild (Grade  II) layered plaque. 8. Mildly dilated pulmonary artery.  Conclusion(s)/Recommendation(s): Findings concerning for aortic valve vegetation. Consider cardiac cta for further clarification.  FINDINGS Left Ventricle: Left ventricular ejection fraction, by estimation, is 60 to 65%. The left ventricle has normal function. The left ventricular internal cavity size was normal in size.  Right Ventricle: The right ventricular size is normal. No increase in right ventricular wall thickness. Right ventricular systolic function is normal.  Left Atrium: Left atrial size was not well visualized. No left atrial/left atrial appendage thrombus was detected. The LAA emptying velocity was 59 cm/s. Right Atrium: Right atrial size was not well visualized.  Mitral Valve: The mitral valve is normal in structure. Mild mitral valve regurgitation.  Tricuspid Valve: Tricuspid leaflets appears to be mildly thickened. Tricuspid valve regurgitation is mild . No evidence of tricuspid stenosis.  Aortic Valve: There is a highly mobile mass on the base of the left coronary cusp highly suspicious for a vegetation. No appreciation of an abcess. Consider cardiac CTA if need for further evaluation. The aortic valve has been repaired/replaced. Aortic valve  regurgitation is trivial. No aortic stenosis is present. Aortic valve mean gradient measures 12.0 mmHg. Aortic valve peak gradient measures 23.4 mmHg. Aortic valve area, by VTI measures 1.75 cm. There is a 29 mm Edwards TAVR valve present in the aortic position.  Pulmonic Valve: The pulmonic valve was normal in structure. Pulmonic valve regurgitation is not visualized.  Aorta: The aortic root and ascending aorta are structurally normal, with no evidence of dilitation. There is mild (Grade II) layered plaque.  Pulmonary Artery: The pulmonary artery is mildly dilated.  Venous: The left upper pulmonary vein, left lower pulmonary vein, right upper pulmonary vein and right lower pulmonary vein are normal. A normal flow pattern is recorded from the left upper pulmonary vein.  IAS/Shunts: No atrial level shunt detected by color flow Doppler.   LEFT VENTRICLE PLAX 2D LVOT diam:     2.10 cm LV SV:         76 LV SV Index:   36 LVOT Area:     3.46 cm   AORTIC VALVE AV Area (Vmax):    1.50 cm AV Area (Vmean):   1.24 cm AV Area (VTI):     1.75 cm AV Vmax:           242.00 cm/s AV Vmean:          157.000 cm/s AV VTI:            0.431 m AV Peak Grad:      23.4 mmHg AV Mean Grad:      12.0 mmHg LVOT Vmax:         105.00 cm/s LVOT Vmean:        56.400 cm/s LVOT VTI:          0.218 m LVOT/AV VTI ratio: 0.51   SHUNTS Systemic VTI:  0.22 m Systemic Diam: 2.10 cm  Kardie Tobb DO Electronically signed by Thomasene Ripple DO Signature Date/Time: 12/08/2021/3:22:09 PM    Final    CT SCANS  CT CORONARY MORPH W/CTA COR W/SCORE 12/13/2021  Addendum 12/13/2021 10:21 AM ADDENDUM REPORT: 12/13/2021 10:19  CLINICAL DATA:  29M with AS s/p TAVR and echo concerning for endocarditis. Evaluate for aortic abscess  EXAM: Cardiac/Coronary CTA  TECHNIQUE: The patient was scanned on a [employer].  FINDINGS: A 100 kV retrospective scan was triggered in the descending thoracic aorta  at 111 HU's. Axial non-contrast 3 mm slices  were carried out through the heart. The data set was analyzed on a dedicated work station and scored using the Agatson method. Gantry rotation speed was 250 msecs and collimation was .6 mm. The 3D data set was reconstructed in 5% intervals of the R-R cycle. Phases were analyzed on a dedicated work station using MPR, MIP and VRT modes. The patient received 100 cc of contrast.  Aortic Valve: S/p TAVR with 29mm Sapien 3 valve. No aortic abscess seen.  Coronary Arteries: Normal coronary origin. Right dominance. Study was without use of NTG ans is not sufficient to evaluate coronary artery stenosis. Calcium score 1489 (85th percentile)  Left Ventricle: Mild dilatation  Left Atrium: Moderate dilatation  Pulmonary Veins: Normal configuration  Right Ventricle: Mild dilatation  Right Atrium: Mild dilatation  Thoracic aorta: Normal size  Pulmonary Arteries: Dilated main PA measuring 30mm  Systemic Veins: Normal drainage  Pericardium: Normal thickness  IMPRESSION: 1. No aortic abscess seen  2. S/p TAVR with 29mm Sapien 3 valve  3. Coronary calcium score 1489 (85th percentile)   Electronically Signed By: Epifanio Lesches M.D. On: 12/13/2021 10:19  Narrative EXAM: OVER-READ INTERPRETATION  CT CHEST  The following report is a limited chest CT over-read performed by radiologist Dr. Allegra Lai of [facility] on 12/13/2021. This over-read does not include interpretation of cardiac or coronary anatomy or pathology. The cardiac CTA interpretation by the cardiologist is attached.  COMPARISON:  Heart CT dated June 01, 2021  FINDINGS: Vascular: Normal caliber thoracic aorta with severe calcified and noncalcified plaque. Prior transcatheter aortic valve replacement. Normal heart size. No pericardial effusion. No suspicious filling defects of the main pulmonary arteries.  Mediastinum/Nodes: Esophagus is  unremarkable. No pathologically enlarged lymph nodes seen in the chest.  Lungs/Pleura: Central airways are patent. Moderate centrilobular emphysema. Small solid pulmonary nodule of the right middle lobe measuring 4 mm on series 10, image 76, new when compared with June 01, 2021 prior. Trace left pleural effusion and bibasilar atelectasis.  Upper Abdomen: No acute abnormality.  Musculoskeletal: No chest wall mass or suspicious bone lesions identified.  IMPRESSION: 1. No acute extracardiac abnormality. 2. Small solid pulmonary nodule of the right middle lobe measuring 4 mm, which is new when compared with June 01, 2021 prior. Recommend follow-up chest CT in 6 months.  Electronically Signed: By: Allegra Lai M.D. On: 12/13/2021 08:43   CT SCANS  CT CORONARY MORPH W/CTA COR W/SCORE 06/01/2021  Addendum 06/01/2021 10:14 PM ADDENDUM REPORT: 06/01/2021 22:12  CLINICAL DATA:  Severe Aortic Stenosis.  EXAM: Cardiac TAVR CT  TECHNIQUE: A non-contrast, gated CT scan was obtained with axial slices of 3 mm through the heart for aortic valve calcium scoring. A 110 kV retrospective, gated, contrast cardiac scan was obtained. Gantry rotation speed was 250 msecs and collimation was 0.6 mm. Nitroglycerin was not given. The 3D data set was reconstructed in 5% intervals of the 0-95% of the R-R cycle. Systolic and diastolic phases were analyzed on a dedicated workstation using MPR, MIP, and VRT modes. The patient received 100 cc of contrast.  FINDINGS: Image quality: Poor. The contrast remained in the SVC and the study had poor contrast opacification.  Noise artifact is: Moderate.  Valve Morphology: Tricuspid aortic valve that is severely calcified. There is bulky calcification of the NCC.  Aortic Valve Calcium score: 2634  Aortic annular dimension:  Phase assessed: 25%  Annular area: 574 mm2  Annular perimeter: 86.2 mm  Max diameter: 30.1 mm  Min diameter:  24.6 mm  Membranous septum length: 13.4 mm  Annular and subannular calcification: None.  Optimal coplanar projection: RAO 3 CAU 15  Coronary Artery Height above Annulus:  Left Main: 9.2 mm  Right Coronary: 16.0 mm  Sinus of Valsalva Measurements:  Non-coronary: 33.5 mm  Right-coronary: 33.6 mm  Left-coronary: 34.5 mm  Sinus of Valsalva Height:  Non-coronary: 25.3 mm  Right-coronary: 23.9 mm  Left-coronary: 19.5 mm  Sinotubular Junction: 28 mm  Ascending Thoracic Aorta: 35 mm  Coronary Arteries: Normal coronary origin. Right dominance. The study was performed without use of NTG and is insufficient for plaque evaluation. Please refer to recent cardiac catheterization for coronary assessment. 3-vessel coronary calcifications noted.  Cardiac Morphology:  Right Atrium: Right atrial size is within normal limits. Contrast reflux into the RA consistent with elevated RA pressure.  Right Ventricle: The right ventricular cavity is within normal limits.  Left Atrium: Left atrial size is normal in size with no left atrial appendage filling defect.  Left Ventricle: The ventricular cavity size is within normal limits. There are no stigmata of prior infarction. There is no abnormal filling defect. Normal left ventricular function, LVEF=56%. No regional wall motion abnormalities.  Pulmonary arteries: Dilated suggestive of pulmonary hypertension.  Pulmonary veins: Normal pulmonary venous drainage.  Pericardium: Normal thickness with no significant effusion or calcium present.  Mitral Valve: The mitral valve is normal structure without significant calcification.  Extra-cardiac findings: See attached radiology report for non-cardiac structures.  IMPRESSION: 1. 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  2. Annular measurements support a 29 mm S3 TAVR (574 mm2).  3. No significant annular or subannular calcifications.  4. Shallow left main height  concerning for increased risk for coronary obstruction (9.2 mm).  5. Optimal Fluoroscopic Angle for Delivery: RAO 3 CAU 15  6. Dilated pulmonary artery suggestive of pulmonary hypertension.  Gerri Spore T. Flora Lipps, MD   Electronically Signed By: Lennie Odor M.D. On: 06/01/2021 22:12  Narrative EXAM: OVER-READ INTERPRETATION  CT CHEST  The following report is an over-read performed by radiologist Dr. Cleone Slim of [facility] on 06/01/2021. This over-read does not include interpretation of cardiac or coronary anatomy or pathology. The coronary CTA interpretation by the cardiologist is attached.  COMPARISON:  06/26/2017 chest CT angiogram.  FINDINGS: Please see the separate concurrent chest CT angiogram report for details.  IMPRESSION: Please see the separate concurrent chest CT angiogram report for details.  Electronically Signed: By: Delbert Phenix M.D. On: 06/01/2021 13:19     ______________________________________________________________________________________________      Risk Assessment/Calculations:            Physical Exam:   VS:  BP (!) 149/78   Pulse 61   SpO2 94%    Wt Readings from Last 3 Encounters:  06/14/23 220 lb (99.8 kg)  06/04/23 220 lb (99.8 kg)  11/23/22 225 lb (102.1 kg)    GEN: Well nourished, well developed in no acute distress NECK: No JVD; No carotid bruits CARDIAC: RRR, no murmurs, rubs, gallops RESPIRATORY:  Clear to auscultation without rales, wheezing or rhonchi  ABDOMEN: Soft, non-tender, non-distended EXTREMITIES:  No edema; No deformity   ASSESSMENT AND PLAN: .    Preoperative clearance: Left shoulder surgery.  He is overdue for repeat echocardiogram given history of TAVR.  His functional ability is quite limited and he cannot achieve more than 4 METS of activity.  I recommend a Lexiscan Myoview to further assess  Dyspnea on exertion: He has a history of COPD.  He is also quite sedentary at baseline.  History  of TAVR: Overdue for repeat echocardiogram  History of nonobstructive CAD: Coronary CT in July 2023 revealed elevated calcium score, the study was done without use of nitroglycerin and was not sufficient to evaluate coronary artery stenosis.    Informed Consent   Shared Decision Making/Informed Consent The risks [chest pain, shortness of breath, cardiac arrhythmias, dizziness, blood pressure fluctuations, myocardial infarction, stroke/transient ischemic attack, nausea, vomiting, allergic reaction, radiation exposure, metallic taste sensation and life-threatening complications (estimated to be 1 in 10,000)], benefits (risk stratification, diagnosing coronary artery disease, treatment guidance) and alternatives of a nuclear stress test were discussed in detail with Mr. Galli and he agrees to proceed.     Dispo: Follow-up in 78-month if study is normal.  Signed, Azalee Course, PA

## 2023-08-28 NOTE — Patient Instructions (Signed)
 Medication Instructions:  NO CHANGES *If you need a refill on your cardiac medications before your next appointment, please call your pharmacy*   Lab Work: NO LABS If you have labs (blood work) drawn today and your tests are completely normal, you will receive your results only by: MyChart Message (if you have MyChart) OR A paper copy in the mail If you have any lab test that is abnormal or we need to change your treatment, we will call you to review the results.   Testing/Procedures:[address] Your physician has requested that you have an echocardiogram. Echocardiography is a painless test that uses sound waves to create images of your heart. It provides your doctor with information about the size and shape of your heart and how well your heart's chambers and valves are working. This procedure takes approximately one hour. There are no restrictions for this procedure. Please do NOT wear cologne, perfume, aftershave, or lotions (deodorant is allowed). Please arrive 15 minutes prior to your appointment time.  Please note: We ask at that you not bring children with you during ultrasound (echo/ vascular) testing. Due to room size and safety concerns, children are not allowed in the ultrasound rooms during exams. Our front office staff cannot provide observation of children in our lobby area while testing is being conducted. An adult accompanying a patient to their appointment will only be allowed in the ultrasound room at the discretion of the ultrasound technician under special circumstances. We apologize for any inconvenience.    Azalee Course, [place] has ordered a Lexiscan Myocardial Perfusion Imaging Study.  Please arrive 15 minutes prior to your appointment time for registration and insurance purposes.   The test will take approximately 3 to 4 hours to complete; you may bring reading material.  If someone comes with you to your appointment, they will need to remain in the main lobby  due to limited space in the testing area. **If you are pregnant or breastfeeding, please notify the nuclear lab prior to your appointment**   How to prepare for your Myocardial Perfusion Test: Do not eat or drink 3 hours prior to your test, except you may have water. Do not consume products containing caffeine (regular or decaffeinated) 12 hours prior to your test. (ex: coffee, chocolate, sodas, tea). Do wear comfortable clothes (no dresses or overalls) and walking shoes, tennis shoes preferred (No heels or open toe shoes are allowed). Do NOT wear cologne, perfume, aftershave, or lotions (deodorant is allowed). If you use an inhaler, use it the AM of your test and bring it with you.  If you use a nebulizer, use it the AM of your test.  If these instructions are not followed, your test will have to be rescheduled.    Follow-Up: At [facility], you and your health needs are our priority.  As part of our continuing mission to provide you with exceptional heart care, we have created designated Provider Care Teams.  These Care Teams include your primary Cardiologist (physician) and Advanced Practice Providers (APPs -  Physician Assistants and Nurse Practitioners) who all work together to provide you with the care you need, when you need it.   Your next appointment:   6 month(s)  Provider:   Garwin Brothers, MD   Other Instructions

## 2023-09-04 ENCOUNTER — Ambulatory Visit (HOSPITAL_COMMUNITY)

## 2023-09-05 ENCOUNTER — Ambulatory Visit (HOSPITAL_COMMUNITY)

## 2023-09-07 ENCOUNTER — Encounter (HOSPITAL_COMMUNITY): Payer: Self-pay

## 2023-09-07 ENCOUNTER — Other Ambulatory Visit: Payer: Self-pay | Admitting: Primary Care

## 2023-09-07 DIAGNOSIS — G4733 Obstructive sleep apnea (adult) (pediatric): Secondary | ICD-10-CM

## 2023-09-07 DIAGNOSIS — J9611 Chronic respiratory failure with hypoxia: Secondary | ICD-10-CM

## 2023-09-07 NOTE — Progress Notes (Signed)
 Can we ask [facility] to do a capnography/ONO test

## 2023-09-11 ENCOUNTER — Telehealth (HOSPITAL_COMMUNITY): Payer: Self-pay | Admitting: *Deleted

## 2023-09-11 NOTE — Telephone Encounter (Signed)
 Pt's wife given instructions for upcoming stress test on 09/12/23.  Ricky Ala

## 2023-09-12 ENCOUNTER — Ambulatory Visit (HOSPITAL_COMMUNITY): Attending: Cardiology

## 2023-09-12 VITALS — Ht 68.0 in | Wt 220.0 lb

## 2023-09-12 DIAGNOSIS — Z01810 Encounter for preprocedural cardiovascular examination: Secondary | ICD-10-CM | POA: Diagnosis not present

## 2023-09-12 DIAGNOSIS — Z01818 Encounter for other preprocedural examination: Secondary | ICD-10-CM | POA: Insufficient documentation

## 2023-09-12 DIAGNOSIS — Z952 Presence of prosthetic heart valve: Secondary | ICD-10-CM | POA: Diagnosis present

## 2023-09-12 DIAGNOSIS — R0609 Other forms of dyspnea: Secondary | ICD-10-CM | POA: Diagnosis present

## 2023-09-12 DIAGNOSIS — I2510 Atherosclerotic heart disease of native coronary artery without angina pectoris: Secondary | ICD-10-CM | POA: Diagnosis present

## 2023-09-12 DIAGNOSIS — I10 Essential (primary) hypertension: Secondary | ICD-10-CM | POA: Diagnosis present

## 2023-09-12 DIAGNOSIS — E785 Hyperlipidemia, unspecified: Secondary | ICD-10-CM | POA: Insufficient documentation

## 2023-09-12 LAB — MYOCARDIAL PERFUSION IMAGING
Base ST Depression (mm): 0 mm
LV dias vol: 83 mL (ref 62–150)
LV sys vol: 30 mL
Nuc Stress EF: 64 %
Peak HR: 71 {beats}/min
Rest HR: 52 {beats}/min
Rest Nuclear Isotope Dose: 10.4 mCi
SDS: 12
SRS: 2
SSS: 14
ST Depression (mm): 0 mm
Stress Nuclear Isotope Dose: 31.4 mCi
TID: 0.99

## 2023-09-12 MED ORDER — TECHNETIUM TC 99M TETROFOSMIN IV KIT
10.4000 | PACK | Freq: Once | INTRAVENOUS | Status: AC | PRN
Start: 2023-09-12 — End: 2023-09-12
  Administered 2023-09-12: 10.4 via INTRAVENOUS

## 2023-09-12 MED ORDER — TECHNETIUM TC 99M TETROFOSMIN IV KIT
31.4000 | PACK | Freq: Once | INTRAVENOUS | Status: AC | PRN
Start: 2023-09-12 — End: 2023-09-12
  Administered 2023-09-12: 31.4 via INTRAVENOUS

## 2023-09-12 MED ORDER — REGADENOSON 0.4 MG/5ML IV SOLN
0.4000 mg | Freq: Once | INTRAVENOUS | Status: AC
Start: 2023-09-12 — End: 2023-09-12
  Administered 2023-09-12: 0.4 mg via INTRAVENOUS

## 2023-09-13 ENCOUNTER — Other Ambulatory Visit

## 2023-09-13 ENCOUNTER — Ambulatory Visit (HOSPITAL_COMMUNITY)

## 2023-09-14 ENCOUNTER — Encounter: Payer: Self-pay | Admitting: Physician Assistant

## 2023-09-14 NOTE — Progress Notes (Signed)
 Please mail Mr. Kyle Lewis a copy of his stress test report and cardiac clearance letter (see under Chart Review --> letters)

## 2023-09-17 ENCOUNTER — Other Ambulatory Visit

## 2023-09-26 ENCOUNTER — Ambulatory Visit: Attending: Physician Assistant

## 2023-09-26 DIAGNOSIS — Z952 Presence of prosthetic heart valve: Secondary | ICD-10-CM | POA: Diagnosis present

## 2023-09-27 LAB — ECHOCARDIOGRAM COMPLETE
AR max vel: 1.38 cm2
AV Area VTI: 1.62 cm2
AV Area mean vel: 1.45 cm2
AV Mean grad: 13.8 mmHg
AV Peak grad: 25.9 mmHg
Ao pk vel: 2.55 m/s
S' Lateral: 3 cm

## 2023-09-28 ENCOUNTER — Encounter: Payer: Self-pay | Admitting: Physician Assistant

## 2023-10-01 ENCOUNTER — Ambulatory Visit: Admitting: Primary Care

## 2023-10-01 ENCOUNTER — Telehealth: Payer: Self-pay | Admitting: Primary Care

## 2023-10-01 NOTE — Telephone Encounter (Signed)
 Can we request ONO for this patient from [facility]. He completed test on Thursday last week. Consideration for NIV

## 2023-10-02 ENCOUNTER — Telehealth: Payer: Self-pay | Admitting: Primary Care

## 2023-10-02 DIAGNOSIS — J9611 Chronic respiratory failure with hypoxia: Secondary | ICD-10-CM

## 2023-10-02 NOTE — Telephone Encounter (Signed)
 Pt needs to be set up with Non Invasive Ventilator (NIV) with [facility] and set up with [facility]. I will place order and call [facility] to make sure what is needed and have them fax us  anything they need filled out. I will give them B pod's fax number to make this easier to grab. ( [phone] Bpod fax)   ATC [facility]. Unable to reach anyone and had to speak to a robot. I will try calling [facility] tomorrow to see what is needed.

## 2023-10-02 NOTE — Telephone Encounter (Signed)
 Overnight oximetry test with capnography on BiPAP with 2 L completed on 09/24/2023 showed patient spent 0 minutes with the oxygen level less than 88%.  Average ET CO2 indication low perfusion.  Patient's chronic respiratory failure due to COPD is life threatening. Patient would benefit from non-invasive ventilation.  Without this therapy, the patient is at high risk of ending up with worsening symptoms, worsened respiratory failure, need for ER visits and/or recurrent hospitalizations.  Bilevel device unable to adequately support patient's nocturnal ventilation needs.  Patient would benefit from NIV therapy with set tidal volumes and pressure.  Patient is having difficulty tolerating BIPAP Recommend trial Non-invasive ventilatory, PLEASE ORDER. Respiratory to assess setting need

## 2023-10-02 NOTE — Telephone Encounter (Signed)
 I called [facility] and spoke to [facility]. Kirsten found the ONO and would fax this to me. I gave her B pod's fax num,ber with my ATTN for it. NFN. Will give to Irby Mannan, NP once received.

## 2023-10-03 ENCOUNTER — Telehealth: Payer: Self-pay | Admitting: Primary Care

## 2023-10-03 NOTE — Telephone Encounter (Signed)
 I called [facility] and spoke to [place]. Blaise Bumps stated she would fax over the papers that are needed to be filled out. I gave her B pod's fax number and had her put the ATTN to me. I will fill this out with the provider and fax back.

## 2023-10-03 NOTE — Telephone Encounter (Signed)
 Per Anselmo Kings at [place]   I have faxed information and an order form for the NIV to [facility] for this patient.  Just wanted you to know

## 2023-10-08 NOTE — Telephone Encounter (Signed)
 I never received this fax. I called [facility] and spoke to [place]. She will fax this over again. I had her fax it to the front office instead of B Pod.

## 2023-10-10 NOTE — Telephone Encounter (Signed)
 I called and spoke to Terri per other note. A new fax should have been sent to the front desk instead of Bpod.

## 2023-10-11 ENCOUNTER — Encounter: Admitting: Podiatry

## 2023-10-11 ENCOUNTER — Other Ambulatory Visit: Payer: Self-pay | Admitting: Orthopedic Surgery

## 2023-10-11 DIAGNOSIS — M19012 Primary osteoarthritis, left shoulder: Secondary | ICD-10-CM

## 2023-10-14 NOTE — Progress Notes (Signed)
 Patient did not show for his scheduled appointment on 10/11/2023

## 2023-10-15 ENCOUNTER — Telehealth: Payer: Self-pay

## 2023-10-15 NOTE — Telephone Encounter (Signed)
   Pre-operative Risk Assessment    Patient Name: Kyle Lewis  DOB: 03-Aug-1945 MRN: [id number]   Date of last office visit: 08/28/23 Kyle MENG, PA Date of next office visit: NONE   Request for Surgical Clearance    Procedure:  LEFT  REVERSE SHOULDER ARTHROPLASTY  Date of Surgery:  Clearance TBD                                Surgeon:  DR Ernestina Headland SUPPLE Surgeon's Group or Practice Name:  [facility] Phone number:  [phone] Fax number:  [phone]  ATTN: Avanell Bob STONE   Type of Clearance Requested:   - Medical    Type of Anesthesia:  General    Additional requests/questions:    SignedCollin Deal   10/15/2023, 12:00 PM

## 2023-10-16 NOTE — Telephone Encounter (Signed)
   Name: [name]  DOB: 14-Jan-1946  MRN: [id number]   Primary Cardiologist: Nelia Balzarine, MD  Chart reviewed as part of pre-operative protocol coverage. Kyle Lewis was last seen on 08/28/2023 by Ervin Heath, PA-C.  Given his functional limitations and history of TAVR a repeat echo and nuclear stress test was ordered.  Echo demonstrated normal LV/RV function with no change to mean pressure gradient across TAVR.  Nuclear stress test was a low risk study.  Per Ervin Heath "Low risk stress test, patient is at acceptable risk to proceed with surgery given low risk stress test."  Therefore, based on ACC/AHA guidelines, the patient would be an acceptable risk for the planned procedure without further cardiovascular testing.   I will route this recommendation to the requesting party via Epic fax function and remove from pre-op pool. Please call with questions.  Morey Ar, NP 10/16/2023, 11:55 AM

## 2023-10-30 ENCOUNTER — Ambulatory Visit
Admission: RE | Admit: 2023-10-30 | Discharge: 2023-10-30 | Discharge status: Home / Self Care | Source: Ambulatory Visit | Attending: Orthopedic Surgery | Admitting: Orthopedic Surgery

## 2023-10-30 DIAGNOSIS — M19012 Primary osteoarthritis, left shoulder: Secondary | ICD-10-CM

## 2023-11-01 NOTE — Telephone Encounter (Signed)
 Received a faxed for this same clearance from [facility]. Will route notes to surgeons office.

## 2023-11-05 NOTE — Telephone Encounter (Signed)
 I have sent the fax number for the front desk to [facility] multiple times, but the front desk still has not received it. I also provided them with my office fax number and asked that the fax be addressed to my attention.

## 2023-11-06 NOTE — Telephone Encounter (Signed)
 This is ready for you in b pod.

## 2023-11-06 NOTE — Telephone Encounter (Signed)
 It has been picked up NFN

## 2023-11-06 NOTE — Telephone Encounter (Signed)
 I have received the NIV I will place it in [employer]. Please return to [facility] when filled out. Thanks!

## 2023-11-08 ENCOUNTER — Telehealth: Payer: Self-pay | Admitting: Primary Care

## 2023-11-08 NOTE — Telephone Encounter (Signed)
 Lewis, Kyle  [place], [place]; [place], Kyle Lewis General vent settings for our machines are AE MODE @ Vt=300-600, PSmin=5, PSMAX=26. BR=AUTO, EPAPmin=4, EPAP max=15, INSPIRATORY TIME=1-3, VOLUME SPEED=1-3, PEEP=5, WITH A MAX Pressure=35. ARS, Please let us  know if the provider gives verbal consent to this and we will fax over order form.  Or you can have the settings put on the order and refax to [phone]

## 2023-11-13 ENCOUNTER — Telehealth: Payer: Self-pay | Admitting: Primary Care

## 2023-11-13 NOTE — Telephone Encounter (Signed)
 Ok, please schedule with MD or APP first available

## 2023-11-13 NOTE — Telephone Encounter (Signed)
 Per Terri at [facility]-  In researching the non invasive ventilator a little further... insurance is also going to require the pt be seen F2F to discuss switching from Bi-pap to [facility] that the provider ordered. Insurance requires us  to show medical necessity.

## 2023-11-14 NOTE — Telephone Encounter (Signed)
 Patient has been scheduled

## 2023-11-14 NOTE — Telephone Encounter (Signed)
 Yes please document in apt note that I ok'd and only double book on a day that I am not already

## 2023-11-16 ENCOUNTER — Telehealth: Payer: Self-pay

## 2023-11-16 NOTE — Telephone Encounter (Signed)
 Pt is scheduled to see Irby Mannan, NP on Monday, 11-19-2023 regarding a switch from Bipap to NIV. I was unable to find a compliance report in [facility]. I called [facility] and spoke to [place]. Blaise Bumps stated that the pt Intermittently used it through February, 2 days in March and 1 day in April. Blaise Bumps stated that the pt may need to bring his SD card in if he has been using it. I also had Blaise Bumps fax over the paperwork stating what exactly they needed from our office to start the NIV process. She is going to fax that to me now.  ATC pt X1. LVM to pt advising he bring in his SD card or machine if he is using it. NFN

## 2023-11-19 ENCOUNTER — Ambulatory Visit: Admitting: Primary Care

## 2023-11-20 ENCOUNTER — Encounter: Payer: Self-pay | Admitting: Primary Care

## 2023-11-20 ENCOUNTER — Ambulatory Visit (INDEPENDENT_AMBULATORY_CARE_PROVIDER_SITE_OTHER): Admitting: Primary Care

## 2023-11-20 VITALS — BP 132/82 | HR 64 | Temp 97.7°F | Ht 67.0 in | Wt 220.0 lb

## 2023-11-20 DIAGNOSIS — F17210 Nicotine dependence, cigarettes, uncomplicated: Secondary | ICD-10-CM | POA: Diagnosis not present

## 2023-11-20 DIAGNOSIS — G4733 Obstructive sleep apnea (adult) (pediatric): Secondary | ICD-10-CM | POA: Diagnosis not present

## 2023-11-20 DIAGNOSIS — J9612 Chronic respiratory failure with hypercapnia: Secondary | ICD-10-CM

## 2023-11-20 NOTE — Progress Notes (Signed)
 @Patient  ID: Kyle Lewis, male    DOB: 11-27-1945, 78 y.o.   MRN: [id number]  Chief Complaint  Patient presents with   Follow-up    Getting off of Bipap and placed on NIV.     Referring provider: No ref. provider found  HPI:  78 year old male, current every day smoker (30+ pack year hx). PMH significant for OSA, moderate COPD, insomnia, aortic stenosis, PVD, stroke, CAD, hypertension, AAA repair, osteoarthritis, lumbar spinal fusion, BPH. Patient of Dr. Gerri Kras.  NPSG 10/06/17 showed AHI 84.6/ hr. BIPAP Max IPAP 25, Min EPAP 5, Min PS 4.  DME [facility]   Previous [facility] pulmonary encounter:  02/24/2019 Patient presents today for 6 month OSA follow-up. He is doing well, reports compliance with BIPAP everynight. He is sleeping well. No issues with pressure setting or mask. Breathing is at baseline. Occasional shortness of breath when walking long distanced. Reports some cough, brings up clear mucus. Seldom wheezing. He is not on a maintenance inhaler and does not have rescue inhaler. Works part time delivering school buses. States that he could not get physical renewed for more than 3 months at a time. DME is [facility].    Airview download -100% compliant with use - Averaging 8 hours - AHI 2.3.   06/23/21- Dr. Portia Brittle f/u ov/Wert re: GOLD 2 just quit smoking 05/2021  maint on no rx at all      Chief Complaint  Patient presents with   Follow-up      PHOS-sob better,occass. Wheeze,not using oxygen at home   Dyspnea:  just walking inside  Cough: none  Sleeping: on bipap s 02 / electric bed  10 degrees / doing fine on present setting SABA use: has it but not using /  02: not using  Covid status:   vax x 2      No obvious day to day or daytime variability or assoc excess/ purulent sputum or mucus plugs or hemoptysis or cp or chest tightness, subjective wheeze or overt sinus or hb symptoms.    Sleeping as abovde  without nocturnal  or early am exacerbation  of respiratory  c/o's or  need for noct saba. Also denies any obvious fluctuation of symptoms with weather or environmental changes or other aggravating or alleviating factors except as outlined above    No unusual exposure hx or h/o childhood pna/ asthma or knowledge of premature birth.  06/05/23- Telephone encounter  Received call from patients wife.  Patient is on BiPAP for OSA.  Wife reports patient has been waking up confused at night recently when wearing BIPAP, feels is suffocating when wearing mask. He was seen in ED yesterday for confusion but not admitted.  Basic labs and UA were unremarkable. She tells me that he sleeps much better when not wearing PAP therapy. Advised he wear oxygen at night when not using BIPAP and sleep at incline.  Recommend getting in-lab titration study to reassess OSA and pressure settings (ordered)  Can we refer to sleep doctor, please schedule first available with either Dr. Gaynell Keeler or Dr. Linder Revere    08/10/2023- Interim hx  Discussed the use of AI scribe software for clinical note transcription with the patient, who gave verbal consent to proceed.  History of Present Illness   The patient is a 78 year old with sleep apnea who presents with difficulty tolerating BiPAP therapy. He is accompanied by his wife. Unable to connect to video portion of the call, transitioned to televisit.    -  CPAP titration did not provide adequate control at tolerated pressure and was changed to BIPAP titration. - An optimal BIPAP pressure was selected for this patient ( 20 /16 cm of water) - Central sleep apnea was not noted during this titration (CAI = 0.5/h). - Moderate oxygen desaturations were observed during this titration (min O2 = 83.0%). Supplemental O2 at 1L added per protocol at 01:48AM due to sustained O2 saturation in low 80%'s. On BIPAP 20/16, with O2 1L, minimum O2 saturation 87%, Mean 91%.  He has a history of sleep apnea and has been using a BiPAP machine. Recently, he underwent a titration  study in the lab due to waking up at night feeling confused and suffocated while wearing the BiPAP mask. Despite adjustments, he continues to have difficulty tolerating the BiPAP, using it five to six nights a week for about five to six hours per night. The mask often feels uncomfortable and does not fit well, leading to issues such as the mask coming off or making noise.  There have been persistent issues with the fit of the BiPAP mask. He has a broad nose and a small bridge between his eyes and nose, making it difficult for the mask to fit properly. He has tried both medium and large sized full face masks, but neither seems to fit well, often coming off or leaking, particularly on the right side. Previously, he used a nasal mask but had issues with mouth breathing.  He has been identified as a mouth breather, which complicates the use of a nasal mask. However, he tolerated the nasal mask better than the full face mask. He uses 2L oxygen at night with the BiPAP at a rate of 2.5 liters. He has been prescribed Xanax, taking half of a 0.5 mg tablet at night to help with anxiety related to the mask. He also takes amlodipine  and metoprolol  at night to manage his blood pressure, as taking them in the morning causes him to feel fatigued in the afternoon.      11/20/2023- Interim hx  Discussed the use of AI scribe software for clinical note transcription with the patient, who gave verbal consent to proceed.  History of Present Illness   Kyle Lewis is a 78 year old male with COPD and chronic respiratory failure who presents for a follow-up regarding noninvasive ventilation. He is accompanied by a family member, possibly his spouse, who provides additional information about his condition.  He is experiencing difficulty tolerating BIPAP and has discussed using non-invasive ventilator. Recent overnight oximetry showed CO2 retention. No recent bronchitis symptoms, such as coughing up colored mucus or having  fevers. He does not use oxygen during the day but does at night. He takes mullin' gummies daily, which he believes helps with his respiratory symptoms.  He experienced a fall on Sunday at a friend's house, tripping over a bar stool. He did not hit his head but fell on his left side, resulting in pain in his left hip that has since radiated to the right side. No bruising is noted. He is not on blood thinners, only taking aspirin  in the morning. He has been using Tylenol  for pain management and has previously used Aleve.  He has a history of benign prostate hypertrophy and overactive bladder. There is mention of a growth on his kidney, but his kidney function is reportedly normal. He takes Xanax at bedtime for anxiety.      Allergies  Allergen Reactions   Cefazolin  Rash and Other (See  Comments)    The patient had surgery and was given cefazolin  intraop. ~ 10 days later he developed a rash confirmed by biopsy to be consistent w/ drug eruption. We cannot know for sure, but this is the most likely agent.      Immunization History  Administered Date(s) Administered   Fluad Quad(high Dose 65+) 04/08/2020, 05/05/2021   Influenza, High Dose Seasonal PF 05/07/2017, 04/03/2018, 02/17/2019   Influenza,inj,Quad PF,6+ Mos 07/08/2015   Influenza,inj,quad, With Preservative 04/03/2018   Influenza-Unspecified 04/18/2017, 05/05/2017, 02/20/2019, 03/05/2021   PFIZER(Purple Top)SARS-COV-2 Vaccination 01/04/2020, 02/04/2020   PNEUMOCOCCAL CONJUGATE-20 04/12/2022   Pneumococcal Polysaccharide-23 05/29/2011   Tdap 11/04/2007    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   Abfraction    Abnormal gait 07/31/2019   Abscess of left leg    Accelerated hypertension 12/01/2021   Accretions on teeth    Acute on chronic diastolic heart failure (HCC)    Acute respiratory failure with hypoxemia (HCC) 04/07/2022   Acute respiratory  failure with hypoxia (HCC) 12/01/2021   Acute respiratory failure with hypoxia and hypercapnia (HCC) 05/27/2021   HC03   06/14/21    =  31  - 06/23/2021   Walked 250 ft  at a slow pace with a cane. Complained of back and left knee hurting with lowest sat 89% so d/c'd 02    Adenomatous colon polyp    Adrenal mass (HCC)    2 benign appearing left adrenal adenomas noted on 01/13/15 CT   Allergy    seasonal   Anxiety 04/08/2020   Arthritis    Atherosclerosis of aorta (HCC) 04/03/2022   Blister of multiple sites of lower extremity 01/10/2018   Body mass index (BMI) 34.0-34.9, adult 04/07/2020   BPH with obstruction/lower urinary tract symptoms    Overview:  Probable based on symptoms   CAD (coronary artery disease) 02/01/2018   CAP (community acquired pneumonia) 11/30/2021   Caries    Carotid arterial disease (HCC) 05/23/2012   Carotid artery occlusion    Chronic back pain    Chronic diastolic CHF (congestive heart failure) (HCC)    Chronic periodontitis    Cigarette smoker 09/05/2018   COLONIC POLYPS, ADENOMATOUS, HX OF 03/17/2010   Qualifier: Diagnosis of  By: Misty Amour CMA (AAMA), Dottie     COPD exacerbation (HCC) 04/06/2022   COPD GOLD II 04/07/2019   Quit smoking 03/2019 - PFT's  04/07/2019  FEV1 1.58 (57 % ) ratio 0.54  p 0 % improvement from saba p nothing prior to study with DLCO  15.22 (66%) corrects to 3.05 (74%)  for alv volume and FV curve concave classically     COPD with acute exacerbation (HCC) 12/01/2021   Degeneration of cervical intervertebral disc    Degeneration of lumbar intervertebral disc 06/14/2015   Depression 03/17/2010   Qualifier: Diagnosis of  By: Nelson-Smith CMA (AAMA), Dottie     Discitis 09/29/2022   Diverticulosis    DIVERTICULOSIS, COLON 03/17/2010   Qualifier: Diagnosis of  By: Misty Amour CMA (AAMA), Dottie     Dyspnea    Encounter for dental examination    Epidural abscess 01/26/2022   Excessive dental attrition    Exogenous obesity  11/08/2013   Overview:  With a nine pound weight gain since his last visit   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   H/O abdominal aortic aneurysm repair 11/09/2021   Heart murmur    History of back  surgery    Rods and Screws in back   History of bacteremia 04/06/2022   History of craniotomy 01/11/2018   History of left knee replacement    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 Nolene Baumgarten and 06/10/2015 last visit   Impairment of balance 10/27/2021   Inflammation of sacroiliac joint (HCC) 05/06/2019   Lower urinary tract symptoms (LUTS) 08/06/2017   Meningioma (HCC)    Microscopic hematuria 11/08/2013   Mitral regurgitation 06/02/2021   Muscle weakness 10/27/2021   Neck pain 03/25/2019   OA (osteoarthritis) of knee 04/01/2012   OAB (overactive bladder) 01/19/2014   Olecranon bursitis of left elbow 11/08/2021   OSA (obstructive sleep apnea)    Bipap per Chodri since ? 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 Abel Hoe & Dr. Sherene Dilling   Severe aortic stenosis 02/01/2018   ECHO 01/30/18 - Left ventricle: The cavity size was normal. There was moderate   focal basal  hypertrophy of the septum. Systolic function was   vigorous. The estimated ejection fraction was in the range of 65%   to 70%. Wall motion was normal; there were no regional wall   motion abnormalities. Doppler parameters are consistent with   abnormal left ventricular relaxation (grade 1 diastolic   dysfun   Status post AAA (abdominal aortic aneurysm) repair 02/01/2018   Stroke (HCC) 2013   tia no residual deficit from   Teeth missing    Ventral hernia 11/08/2013   Overview:  S/p repair with alloderm mesh and s/p SB resection    Tobacco History: Social History   Tobacco Use  Smoking Status Former   Current packs/day: 0.00   Average packs/day: 0.8 packs/day for 40.0 years (30.0 ttl pk-yrs)   Types: Cigarettes   Start date: 05/05/1981   Quit date: 05/05/2021   Years since quitting: 2.5  Smokeless Tobacco Never  Tobacco Comments   1/2 pack a day if that    Counseling given: Not Answered Tobacco comments: 1/2 pack a day if that    Outpatient Medications Prior to Visit  Medication Sig Dispense Refill   acetaminophen  (TYLENOL ) 500 MG tablet Take 1,000 mg by mouth every 6 (six) hours as needed for moderate pain.     ALPRAZolam (XANAX) 0.25 MG tablet Take 0.25 mg by mouth at bedtime as needed for anxiety or sleep.     amLODipine  (NORVASC ) 10 MG tablet Take 1 tablet (10 mg total) by mouth daily. 90 tablet 1   finasteride  (PROSCAR ) 5 MG tablet Take 1 tablet (5 mg total) by mouth daily. 90 tablet 1  furosemide  (LASIX ) 20 MG tablet Take 1-2 tablets (20-40 mg total) by mouth as needed for fluid or edema. 90 tablet 3   ketoconazole  (NIZORAL ) 2 % cream Apply 1 Application topically daily. 60 g 0   metoprolol  tartrate (LOPRESSOR ) 25 MG tablet 1/2 tab to equal 12.5mg  dose with food Orally once a day for 30 days     oxyCODONE  (OXY IR/ROXICODONE ) 5 MG immediate release tablet Take 1 tablet (5 mg total) by mouth every 6 (six) hours as needed for severe pain. 60 tablet 0   pantoprazole (PROTONIX) 20  MG tablet TAKE 1 TABLET BY MOUTH DAILY 0.5 TO 1 HOUR BEFORE BREAKFAST Orally Once a day     sertraline  (ZOLOFT ) 50 MG tablet Take 1 tablet by mouth once daily 90 tablet 0   tamsulosin  (FLOMAX ) 0.4 MG CAPS capsule TAKE 1 CAPSULE BY MOUTH ONCE DAILY AFTER SUPPER 90 capsule 3   No facility-administered medications prior to visit.    Review of Systems  Review of Systems  Constitutional: Negative.   Respiratory: Negative.  Negative for cough, shortness of breath and wheezing.   Musculoskeletal:  Positive for arthralgias.       Left hip pain     Physical Exam  BP 132/82 (BP Location: Right Arm, Patient Position: Sitting, Cuff Size: Normal)   Pulse 64   Temp 97.7 F (36.5 C) (Temporal)   Ht 5' 7 (1.702 m)   Wt 220 lb (99.8 kg)   SpO2 92%   BMI 34.46 kg/m  Physical Exam Constitutional:      General: He is not in acute distress.    Appearance: Normal appearance. He is ill-appearing.  HENT:     Head: Normocephalic and atraumatic.   Cardiovascular:     Rate and Rhythm: Normal rate and regular rhythm.  Pulmonary:     Effort: Pulmonary effort is normal. No respiratory distress.     Breath sounds: Normal breath sounds.   Musculoskeletal:     Comments: Amb with walker    Skin:    Comments: Small bruising above left hip, no hematoma    Neurological:     General: No focal deficit present.     Mental Status: He is alert and oriented to person, place, and time. Mental status is at baseline.   Psychiatric:        Mood and Affect: Mood normal.        Behavior: Behavior normal.        Thought Content: Thought content normal.        Judgment: Judgment normal.      Lab Results:  CBC    Component Value Date/Time   WBC 6.4 06/04/2023 1543   RBC 4.41 06/04/2023 1543   HGB 13.7 06/04/2023 1543   HGB 13.1 01/17/2018 1520   HCT 40.4 06/04/2023 1543   HCT 38.8 01/17/2018 1520   PLT 189 06/04/2023 1543   PLT 357 01/17/2018 1520   MCV 91.6 06/04/2023 1543   MCV 91 01/17/2018  1520   MCH 31.1 06/04/2023 1543   MCHC 33.9 06/04/2023 1543   RDW 13.2 06/04/2023 1543   RDW 15.8 (H) 01/17/2018 1520   LYMPHSABS 1.1 06/04/2023 1543   LYMPHSABS 2.6 09/11/2016 1647   MONOABS 0.4 06/04/2023 1543   EOSABS 0.2 06/04/2023 1543   EOSABS 0.3 09/11/2016 1647   BASOSABS 0.0 06/04/2023 1543   BASOSABS 0.0 09/11/2016 1647    BMET    Component Value Date/Time   NA 140 06/04/2023 1543  NA 143 11/23/2022 1528   K 3.9 06/04/2023 1543   CL 102 06/04/2023 1543   CO2 29 06/04/2023 1543   GLUCOSE 108 (H) 06/04/2023 1543   BUN 19 06/04/2023 1543   BUN 20 11/23/2022 1528   CREATININE 0.71 06/04/2023 1543   CREATININE 0.79 06/14/2021 1523   CALCIUM  8.8 (L) 06/04/2023 1543   GFRNONAA >60 06/04/2023 1543   GFRNONAA 93 04/08/2020 1506   GFRAA 107 04/08/2020 1506    BNP    Component Value Date/Time   BNP 52.4 04/06/2022 1311    ProBNP No results found for: PROBNP  Imaging: No results found.   Assessment & Plan:   No problem-specific Assessment & Plan notes found for this encounter.  Obstructive sleep apnea with chronic respiratory failure with hypercarbia  - NPSG 10/06/17 showed AHI 84.6/ hr. BIPAP Max IPAP 25, Min EPAP 5, Min PS 4.  DME [facility]   - CPAP titration did not provide adequate control at tolerated pressure and was changed to BIPAP titration. - An optimal BIPAP pressure was selected for this patient ( 20 /16 cm of water) - Central sleep apnea was not noted during this titration (CAI = 0.5/h). - Moderate oxygen desaturations were observed during this titration (min O2 = 83.0%). Supplemental O2 at 1L added per protocol at 01:48AM due to sustained O2 saturation in low 80%'s. On BIPAP 20/16, with O2 1L, minimum O2 saturation 87%, Mean 91%.  Overnight oximetry test with capnography on BiPAP with 2 L completed on 09/24/2023 showed patient spent 0 minutes with the oxygen level less than 88%.  Average ET CO2 indication low perfusion.   Patient's chronic  respiratory failure due to OSA/COPD is life threatening. Patient would benefit from non-invasive ventilation.  Without this therapy, the patient is at high risk of ending up with worsening symptoms, worsened respiratory failure, need for ER visits and/or recurrent hospitalizations.  Bilevel device unable to adequately support patient's nocturnal ventilation needs.  Patient would benefit from NIV therapy with set tidal volumes and pressure.   Patient is having difficulty tolerating BIPAP, recommend trial Non-invasive ventilatory DME. Order has been placed.   COPD -Not currently on scheduled BD. No acute respiratory complaints. Address at follow-up   Follow-up 8 weeks for virtual visit TRIOLOGY compliance    Antonio Baumgarten, NP 11/20/2023

## 2023-11-20 NOTE — Patient Instructions (Addendum)
-  CHRONIC RESPIRATORY FAILURE WITH HYPERCAPNIA: Chronic respiratory failure with hypercapnia means that your lungs are not able to remove enough carbon dioxide from your blood. We plan to use a noninvasive ventilator with Trilogy to help you breathe better and reduce CO2 levels. Please use the ventilator for 4-6 hours daily, preferably during sleep, and consider using it during the day if you feel short of breath.  -CHRONIC OBSTRUCTIVE PULMONARY DISEASE (COPD): COPD is a chronic lung disease that makes it hard to breathe. We will continue managing your COPD with noninvasive ventilation to improve your breathing and comfort. You should continue using oxygen at night as needed.  -FALL WITH HIP PAIN: You recently fell and have been experiencing pain in your left hip, which may be musculoskeletal. We recommend light activity and range of motion exercises, alternating heat and ice application, and taking ibuprofen with food, alternating with acetaminophen  if needed. If the pain persists or worsens by Friday, we may need to do an x-ray.   INSTRUCTIONS: Please schedule a follow-up appointment in 8 weeks to evaluate your response to the noninvasive ventilation and your overall health status. This appointment can be virtual if you undergo shoulder surgery and are in a facility.  Follow-up 8-12 weeks/ virtual visit ok TRILOGY compliance

## 2023-12-25 ENCOUNTER — Telehealth: Payer: Self-pay | Admitting: Primary Care

## 2023-12-25 DIAGNOSIS — J9611 Chronic respiratory failure with hypoxia: Secondary | ICD-10-CM

## 2023-12-25 DIAGNOSIS — G4733 Obstructive sleep apnea (adult) (pediatric): Secondary | ICD-10-CM

## 2023-12-25 NOTE — Telephone Encounter (Signed)
 ABGS are scheduled with the front

## 2023-12-25 NOTE — Telephone Encounter (Signed)
 Ashlyn can you let patient know we need to drawn arterial blood gas per updated insurance requirement for Trilogy ventilator   Sonny can we schedule ABGs, I placed an order

## 2023-12-25 NOTE — Telephone Encounter (Signed)
 Per Terri at [facility] has just recently changed their guidelines on the non-invasive ventilators. We are having to go back to all patients set up with this equipment after 11/12/23 and have new ABGs drawn. Please have this completed on this patient as soon as possible so there are no interruptions in his service or any out of pocket expenses. Let us  know when he has an appointment scheduled. Thank you.   Please advise

## 2023-12-25 NOTE — Telephone Encounter (Signed)
 I do not schedule ABG  Routing to [facility] for this, thanks

## 2023-12-27 NOTE — Telephone Encounter (Signed)
 Spoke to wife. She will go to [facility] Tue at 1:00 for PFT.

## 2023-12-27 NOTE — Telephone Encounter (Signed)
 Called [facility]. They have availability Tue, the 29th of July at 9:00 or 10:00 AM for this specialized PFT. LM for PT to call, ask for me, and I will sched if this day is OK.

## 2024-01-01 ENCOUNTER — Inpatient Hospital Stay (HOSPITAL_COMMUNITY): Admission: RE | Admit: 2024-01-01 | Source: Ambulatory Visit

## 2024-01-30 ENCOUNTER — Telehealth: Payer: Self-pay | Admitting: Primary Care

## 2024-01-30 NOTE — Telephone Encounter (Signed)
 Per Terri at [facility] I am following up on this as it appears the patient never came in to get updates ABGs completed. If we cannot get an updated ABG completed, we will have to pick up his NIV as insurance is not paying for the equipment.

## 2024-01-31 ENCOUNTER — Telehealth: Payer: Self-pay | Admitting: Primary Care

## 2024-01-31 NOTE — Telephone Encounter (Signed)
 Routing to admin pool since they schedule the ABG's- please schedule prior to ov 02/05/24, thanks so much!

## 2024-01-31 NOTE — Telephone Encounter (Signed)
 Can we call patient and see if he can get ABGs done before his visit with me on 9/2

## 2024-01-31 NOTE — Telephone Encounter (Signed)
 [facility] calling to tell us  wife canceled the Blood Gas appt at the hospital stating that they do not need it. Joen said she has more appt's early Tue if the PT wants to come in to have it done before his appt . Please call if wife to clarify if he needs it or not. Thanks.

## 2024-01-31 NOTE — Telephone Encounter (Signed)
 Was able to speak with Kyle Lewis RT. They have the patient scheduled for September 2nd at 10am. Called to notify patient and wife answered the phone. Notified her that the patient need an ABG. She did not think that this would be best for the patient to have an ABG and appointment in the same day due to his limited mobility and that he had an ABG in the past. Told her I would call back to make sure that it was needed. Tried calling back and left a voicemail that it would be necessary for him to have this done so that equipment would not be taken.

## 2024-01-31 NOTE — Telephone Encounter (Signed)
Was this taken care of yet? 

## 2024-01-31 NOTE — Telephone Encounter (Signed)
 Left voicemail for Jolynn Pack RT to get patient to get scheduled for an AGB before the 2nd.

## 2024-01-31 NOTE — Telephone Encounter (Signed)
 I spoke to the pt's wife while I was in the front office with Tannessa. Pt's wife states she did not understand why an ABG was needed if the pt already had his equipment. I informed pt's wife that a new rule was placed that ABG's are required or NIV I informed the pt's wife that the equipment can be taken away if the DME/ Ins does not have what they need for the NIV order. Pt wife verbalized understanding, and she agreed to have the ABG scheduled for 2:00 pm on 02-05-24 before the appt with Landry Ferrari, NP. NFN

## 2024-02-01 ENCOUNTER — Telehealth: Payer: Self-pay

## 2024-02-01 NOTE — Telephone Encounter (Signed)
 Pt is coming in on Tuesday, 02-05-24 for an appt with Almarie Ferrari, NP. Pt is on a NIV so I called [facility] and spoke to [place] regarding a NIV download. Dewayne will have someone fax a copy to [facility]. NFN

## 2024-02-01 NOTE — Telephone Encounter (Signed)
 ABG and follow up scheduled. Nothing further needed.

## 2024-02-05 ENCOUNTER — Encounter (HOSPITAL_COMMUNITY)

## 2024-02-05 ENCOUNTER — Ambulatory Visit: Admitting: Primary Care

## 2024-02-05 DIAGNOSIS — J9601 Acute respiratory failure with hypoxia: Secondary | ICD-10-CM

## 2024-02-05 DIAGNOSIS — J9611 Chronic respiratory failure with hypoxia: Secondary | ICD-10-CM

## 2024-02-05 DIAGNOSIS — G4733 Obstructive sleep apnea (adult) (pediatric): Secondary | ICD-10-CM

## 2024-02-13 ENCOUNTER — Ambulatory Visit (HOSPITAL_COMMUNITY)
Admission: RE | Admit: 2024-02-13 | Discharge: 2024-02-13 | Disposition: A | Discharge status: Home / Self Care | Source: Ambulatory Visit | Attending: Primary Care | Admitting: Primary Care

## 2024-02-13 ENCOUNTER — Telehealth: Payer: Self-pay

## 2024-02-13 ENCOUNTER — Ambulatory Visit: Payer: Self-pay | Admitting: Primary Care

## 2024-02-13 DIAGNOSIS — J9611 Chronic respiratory failure with hypoxia: Secondary | ICD-10-CM | POA: Insufficient documentation

## 2024-02-13 DIAGNOSIS — G4733 Obstructive sleep apnea (adult) (pediatric): Secondary | ICD-10-CM | POA: Insufficient documentation

## 2024-02-13 LAB — BLOOD GAS, ARTERIAL
Acid-Base Excess: 3.2 mmol/L — ABNORMAL HIGH (ref 0.0–2.0)
Bicarbonate: 28.5 mmol/L — ABNORMAL HIGH (ref 20.0–28.0)
Drawn by: 56003
O2 Saturation: 97.8 %
Patient temperature: 36.6
pCO2 arterial: 44 mmHg (ref 32–48)
pH, Arterial: 7.42 (ref 7.35–7.45)
pO2, Arterial: 64 mmHg — ABNORMAL LOW (ref 83–108)

## 2024-02-13 NOTE — Progress Notes (Signed)
 RT NOTE:  Pt ABG results as followed:   Latest Reference Range & Units 02/13/24 13:53  pH, Arterial 7.35 - 7.45  7.42  pCO2 arterial 32 - 48 mmHg 44  pO2, Arterial 83 - 108 mmHg 64 (L)  Acid-Base Excess 0.0 - 2.0 mmol/L 3.2 (H)  Bicarbonate 20.0 - 28.0 mmol/L 28.5 (H)  O2 Saturation % 97.8  Patient temperature  36.6  Collection site  RIGHT RADIAL  Allens test (pass/fail) PASS  PASS  (L): Data is abnormally low (H): Data is abnormally high  Reported results to Hope, NP through [facility] secure chat and gave fax information to [employer] as well.

## 2024-02-13 NOTE — Progress Notes (Signed)
 02/13/24 13:53  ABGs>> pH, Arterial: 7.42 pCO2 arterial: 44 pO2, Arterial: 64 (L) Acid-Base Excess: 3.2 (H) Bicarbonate: 28.5 (H) O2 Saturation: 97.8 Patient temperature: 36.6 Collection site: RIGHT RADIAL Allens test (pass/fail): PASS   Please place order for NIV, I believe [employer] is his DME company. Can we call them and make sure they send a respiratory tech out to adjust settings

## 2024-02-13 NOTE — Telephone Encounter (Signed)
 Send  order for TRILOGY ventilator over to [employer]. Their fax number is [phone]

## 2024-02-13 NOTE — Telephone Encounter (Signed)
 Received incoming call from Kayla Brier and she was calling to verify if the pt needed a PFT & ABG or just an ABG.   I talked to [facility] in person and she verified the pt just needs an ABG. Nothing further needed.

## 2024-02-15 ENCOUNTER — Other Ambulatory Visit (HOSPITAL_BASED_OUTPATIENT_CLINIC_OR_DEPARTMENT_OTHER): Payer: Self-pay

## 2024-02-15 DIAGNOSIS — G4733 Obstructive sleep apnea (adult) (pediatric): Secondary | ICD-10-CM

## 2024-02-15 DIAGNOSIS — J9612 Chronic respiratory failure with hypercapnia: Secondary | ICD-10-CM

## 2024-02-15 NOTE — Progress Notes (Signed)
 Order placed

## 2024-02-15 NOTE — Addendum Note (Signed)
 Addended by: TRUDY WARREN CROME on: 02/15/2024 10:29 AM   Modules accepted: Orders

## 2024-02-18 NOTE — Telephone Encounter (Signed)
 Wife with patient-- Wife wanted to know what the results of his blood gas were and what the provider's plans are at this time

## 2024-02-22 NOTE — Progress Notes (Signed)
 This was done to qualify for Trilogy vent, without these results DME was going to take it away. Make sure patient is using ventilator at night. I needs a download to review his compliance from respiratory tech. ABG results are better than previous. He needs to establish with a  sleep doctor, first available- either Dr. Neysa Epley, [facility] in [place] who has availability or [place].  Patient did not tolerate BIPAP in the past, has severe sleep apnea along with resp failure and COPD.

## 2024-02-22 NOTE — Telephone Encounter (Signed)
 Copied from CRM [id number]. Topic: Clinical - Medical Advice >> Feb 21, 2024  3:46 PM Corean SAUNDERS wrote: Reason for CRM: Patients wife is requesting a call back as she is very concerned about the patients blood gas level results.   Please call wife Shona back [phone]   02/13/24 13:53  ABGs>> pH, Arterial: 7.42 pCO2 arterial: 44 pO2, Arterial: 64 (L) Acid-Base Excess: 3.2 (H) Bicarbonate: 28.5 (H) O2 Saturation: 97.8 Patient temperature: 36.6 Collection site: RIGHT RADIAL Allens test (pass/fail): PASS  Please place order for NIV, I believe [employer] is his DME company. Can we call them and make sure they send a respiratory tech out to adjust settings   Beth can you please advise of next steps & in depth ABG results as patients wife is very confused and concerned for what's next.

## 2024-02-22 NOTE — Progress Notes (Signed)
 Called and spoke with the patients wife (DPR). Pt is using ventilator. I offered to schedule pt appt with sleep doctor and pts wife stated she would call back later to schedule this.  Soonest avail is with Dr. Jess in [place].  Pts wife also stated they are thinking about pt having shoulder surgery for 03/20/24 and would like to know what Landry thinks about this.  Routing back to you [place].

## 2024-02-22 NOTE — Progress Notes (Signed)
 He would need surgical clearance which requires an office visit. If he is doing well I can see him in October but if he is having any issues at all he really needs to establish with Sleep MD and the earliest apt would most likely be in [place] with Dr. Jess who would be a great fit for the patient. He is complex and has severe OSA, COPD and respiratory failure previous intolerant to BIPAP now on Trilogy vent.

## 2024-02-29 NOTE — Progress Notes (Signed)
 Spoke with patient's wife, patient is not doing the shoulder surgery anymore and did not want to schedule with a sleep doctor at this time.

## 2024-03-06 NOTE — Telephone Encounter (Signed)
 FYI

## 2024-03-10 ENCOUNTER — Ambulatory Visit: Admitting: Primary Care

## 2024-03-10 ENCOUNTER — Encounter: Payer: Self-pay | Admitting: Primary Care

## 2024-03-10 ENCOUNTER — Encounter (HOSPITAL_COMMUNITY): Admission: RE | Admit: 2024-03-10 | Source: Ambulatory Visit

## 2024-03-12 ENCOUNTER — Ambulatory Visit: Admitting: Cardiology

## 2024-03-20 ENCOUNTER — Inpatient Hospital Stay (HOSPITAL_COMMUNITY): Admit: 2024-03-20 | Admitting: Orthopedic Surgery

## 2024-03-20 SURGERY — ARTHROPLASTY, SHOULDER, TOTAL, REVERSE
Anesthesia: General | Site: Shoulder | Laterality: Left

## 2024-03-26 ENCOUNTER — Encounter (HOSPITAL_COMMUNITY)

## 2024-03-26 ENCOUNTER — Ambulatory Visit: Admitting: Primary Care

## 2024-04-16 ENCOUNTER — Telehealth: Payer: Self-pay

## 2024-04-16 NOTE — Telephone Encounter (Signed)
 That's the only one  He can/ should request medical record Thanks

## 2024-04-16 NOTE — Telephone Encounter (Signed)
 Patient aware

## 2024-04-16 NOTE — Telephone Encounter (Signed)
 Patient wife called to ask what is the two bacteria that patient was treated for in 2023. Informed her of gemella and I would have to message a provider regarding the other.  Dr.Vu could you assist please?   Leola Fiore SHAUNNA Letters, CMA

## 2024-05-15 ENCOUNTER — Encounter: Payer: Self-pay | Admitting: Internal Medicine

## 2024-05-15 ENCOUNTER — Ambulatory Visit: Admitting: Internal Medicine

## 2024-05-15 ENCOUNTER — Other Ambulatory Visit: Payer: Self-pay

## 2024-05-15 VITALS — BP 140/66 | HR 62 | Temp 98.3°F | Resp 16

## 2024-05-15 DIAGNOSIS — M4646 Discitis, unspecified, lumbar region: Secondary | ICD-10-CM

## 2024-05-15 DIAGNOSIS — J449 Chronic obstructive pulmonary disease, unspecified: Secondary | ICD-10-CM | POA: Diagnosis not present

## 2024-05-15 DIAGNOSIS — Z87891 Personal history of nicotine dependence: Secondary | ICD-10-CM | POA: Diagnosis not present

## 2024-05-15 DIAGNOSIS — I10 Essential (primary) hypertension: Secondary | ICD-10-CM

## 2024-05-15 DIAGNOSIS — R197 Diarrhea, unspecified: Secondary | ICD-10-CM | POA: Diagnosis present

## 2024-05-15 DIAGNOSIS — R0902 Hypoxemia: Secondary | ICD-10-CM

## 2024-05-15 DIAGNOSIS — J439 Emphysema, unspecified: Secondary | ICD-10-CM

## 2024-05-15 NOTE — Progress Notes (Signed)
 RFV: asking if still needs to be chronic abtx  Patient ID: ABDULKAREEM BADOLATO, male   DOB: 27-Nov-1945, 78 y.o.   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   He also had MRi of lumbar spine showing L5-S1 discitis/OM with epidural abscess and possible left shoulder septic arthritis. He was treated with 8 wks of IV abtx that finished 01/30/22 then transitioned to oral high dose amoxicillin  igm TID. Follow up on lumbar spine MRI on 03/13/22 showed still some interval decrease in the residual abscess/phelgmon. In mid sept 2023, his last visit, he was transitioned to suppressive 500mg  dose BID indefinitely.   He returns with his wife wondering if he can stop abtx given that he has intermittent diarrhea and bloating that they associated with his abtx. He Has diarrhea -  2 x per week -- has some cramping, and watery diarrhea but gets better on it's own.  Denies any worsening back pain   He also has hx of copd, wears supp oxygen at night. Since ambulating from parking lot to clinic, his initial pulse oxymetry sats showed 88%. He is mildly tachypnic initially but then improves  by end of appt with supplemental oxygen given   Outpatient Encounter Medications as of 05/15/2024  Medication Sig   acetaminophen  (TYLENOL ) 500 MG tablet Take 1,000 mg by mouth every 6 (six) hours as needed for moderate pain.   ALPRAZolam (XANAX) 0.25 MG tablet Take 0.25 mg by mouth at bedtime as needed for anxiety or sleep.   amLODipine  (NORVASC ) 10 MG tablet Take 1 tablet (10 mg total) by mouth daily.   amoxicillin  (AMOXIL ) 500 MG capsule Take 500 mg by mouth 2 (two) times daily.   finasteride  (PROSCAR ) 5 MG tablet Take 1 tablet (5 mg total) by mouth daily.   furosemide  (LASIX ) 20 MG tablet Take 1-2 tablets (20-40 mg total) by mouth as needed  for fluid or edema.   ketoconazole  (NIZORAL ) 2 % cream Apply 1 Application topically daily.   metoprolol  tartrate (LOPRESSOR ) 25 MG tablet 1/2 tab to equal 12.5mg  dose with food Orally once a day for 30 days   oxyCODONE  (OXY IR/ROXICODONE ) 5 MG immediate release tablet Take 1 tablet (5 mg total) by mouth every 6 (six) hours as needed for severe pain.   pantoprazole (PROTONIX) 20 MG tablet TAKE 1 TABLET BY MOUTH DAILY 0.5 TO 1 HOUR BEFORE BREAKFAST Orally Once a day   sertraline  (ZOLOFT ) 50 MG tablet Take 1 tablet by mouth once daily   tamsulosin  (FLOMAX ) 0.4 MG CAPS capsule TAKE 1 CAPSULE BY MOUTH ONCE DAILY AFTER SUPPER   No facility-administered encounter medications on file as of 05/15/2024.     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   Accelerated hypertension 12/01/2021   Acute respiratory failure with hypoxia (HCC) 12/01/2021   COPD with acute exacerbation (HCC) 12/01/2021   CAP (community acquired pneumonia) 11/30/2021   H/O abdominal aortic aneurysm repair 11/09/2021   Olecranon bursitis of left elbow 11/08/2021   AAA (abdominal aortic aneurysm) 11/07/2021   Abscess of left leg 11/07/2021   Adrenal mass 11/07/2021   Allergy 11/07/2021   Arthritis 11/07/2021   Carotid artery occlusion 11/07/2021   Degeneration of cervical intervertebral disc 11/07/2021   Diverticulosis 11/07/2021   Dyspnea 11/07/2021    Heart murmur 11/07/2021   History of back surgery 11/07/2021   History of left knee replacement 11/07/2021   Meningioma (HCC) 11/07/2021   Ringing in ear 11/07/2021   Impairment of balance 10/27/2021   Muscle weakness 10/27/2021   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   Mitral regurgitation 06/02/2021   Chronic diastolic CHF (congestive heart failure) (HCC)    Acute respiratory failure with hypoxia and hypercapnia (HCC) 05/27/2021   Anxiety 04/08/2020   Body mass index (BMI) 34.0-34.9, adult 04/07/2020   Abnormal gait 07/31/2019   Inflammation of sacroiliac joint 05/06/2019   COPD GOLD II 04/07/2019   Neck pain 03/25/2019   Preop cardiovascular exam 10/24/2018   Cigarette smoker 09/05/2018   CAD (coronary artery disease) 02/01/2018   Severe aortic stenosis 02/01/2018   Status post AAA (abdominal aortic aneurysm) repair 02/01/2018   Pre-procedure lab exam 01/19/2018   Hyperlipidemia LDL goal <70 01/19/2018   History of craniotomy 01/11/2018   Blister of multiple sites of lower extremity 01/10/2018   Adenomatous colon polyp 01/10/2018   Renal lesion 11/19/2017   Other specified postprocedural states 09/25/2017   Osteoarthritis of right glenohumeral joint 08/14/2017   Lower urinary tract symptoms (LUTS) 08/06/2017   S/P lumbar spinal fusion 07/07/2015   Iliac artery aneurysm, left 07/01/2015   Peripheral vascular disease 07/01/2015   Degeneration of lumbar intervertebral disc 06/14/2015   History of right MCA stroke 07/21/2014   OAB (overactive bladder) 01/19/2014   Stroke (HCC) 01/01/2014   Exogenous obesity 11/08/2013   Hypertonicity of bladder 11/08/2013   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   Microscopic hematuria 11/08/2013   History of tobacco abuse 11/08/2013   Ventral hernia 11/08/2013   Carotid arterial disease (HCC) 05/23/2012   Hypertension    OSA (obstructive sleep apnea)    Chronic back pain    BPH with obstruction/lower urinary tract symptoms     OA (osteoarthritis) of knee 04/01/2012   Depression 03/17/2010   Diverticulosis of colon 03/17/2010   History of colonic polyps 03/17/2010     Health Maintenance Due  Topic Date Due   Medicare Annual Wellness (AWV)  Never done   Zoster Vaccines- Shingrix (1 of 2) Never done   DTaP/Tdap/Td (2 - Td or Tdap) 11/03/2017   Lung Cancer Screening  12/14/2022   Influenza Vaccine  01/04/2024   COVID-19 Vaccine (5 - 2025-26 season) 02/04/2024     Review of Systems 12 point ros is negative except what is mentioned above Physical Exam   Resp 16   Physical Exam  Constitutional: He is oriented to person, place, and time. He appears well-developed and well-nourished. No distress.  HENT:  Mouth/Throat: Oropharynx is clear and moist. No oropharyngeal exudate.  Cardiovascular: Normal rate, regular rhythm and normal heart sounds. Exam reveals no gallop and no friction rub.  No murmur heard.  Pulmonary/Chest: Effort normal and breath sounds normal. No respiratory distress. He has no  wheezes.  Abdominal: Soft. Bowel sounds are normal. He exhibits no distension. There is no tenderness.  Lymphadenopathy:  He has no cervical adenopathy.  Neurological: He is alert and oriented to person, place, and time.  Skin: Skin is warm and dry. No rash noted. No erythema.  Psychiatric: He has a normal mood and affect. His behavior is normal.   Lab Results  Component Value Date   LABRPR Non Reactive 09/25/2019    CBC Lab Results  Component Value Date   WBC 6.4 06/04/2023   RBC 4.41 06/04/2023   HGB 13.7 06/04/2023   HCT 40.4 06/04/2023   PLT 189 06/04/2023   MCV 91.6 06/04/2023   MCH 31.1 06/04/2023   MCHC 33.9 06/04/2023   RDW 13.2 06/04/2023   LYMPHSABS 1.1 06/04/2023   MONOABS 0.4 06/04/2023   EOSABS 0.2 06/04/2023    BMET Lab Results  Component Value Date   NA 140 06/04/2023   K 3.9 06/04/2023   CL 102 06/04/2023   CO2 29 06/04/2023   GLUCOSE 108 (H) 06/04/2023   BUN 19 06/04/2023    CREATININE 0.71 06/04/2023   CALCIUM  8.8 (L) 06/04/2023   GFRNONAA >60 06/04/2023   GFRAA 107 04/08/2020      Assessment and Plan Hx of gemella, nutritional deficient strep species, discitis/om treated for > 2 yrs. = has been stable. No worsening of back pain.  Will get baseline labs Will check sed rate and crp. We can consider stopping abtx and monitoring off of abtx. Plan for follow up visit in 6 wk - video visitis/ arrange [facility] in [place]  Diarrhea = it could be abtx associated, don't suspect it is cdifficile since it appears to wax and wane and not perfuse. We will see if his bowel movements change in nature after stopping abtx.  Hypoxia due to copd = gave supplemental oxygen. Patient reports he is at his baseline. Recommend to monitor his pulse ox and discuss with pcp for further evaluation/need of supp oxygen.  Hypertension = likely due to deconditioning/ambulation; will not adjust meds at this time  Health maintenance = vaccine hesistant -Declined vaccine despite many risk factors for severe flu/covid  I personally spent a total of 40 minutes in the care of the patient today including preparing to see the patient, getting/reviewing separately obtained history, performing a medically appropriate exam/evaluation, counseling and educating, placing orders, documenting clinical information in the EHR, independently interpreting results, and communicating results.

## 2024-05-16 LAB — C-REACTIVE PROTEIN: CRP: <3 mg/L (ref <8.0)

## 2024-05-16 LAB — SEDIMENTATION RATE: Sed Rate: 2 mm/h (ref 0–20)

## 2024-05-23 ENCOUNTER — Telehealth: Payer: Self-pay

## 2024-05-23 NOTE — Telephone Encounter (Signed)
 12/19 @ 11:26 Called patient's wife after received voicemail patient want to change appt time on 12/22, but patient has no appts with [facility].  Left voicemail for her to call Dr. Glenwood office in [place] were he is scheduled at [phone].

## 2024-05-26 ENCOUNTER — Ambulatory Visit: Admitting: Radiation Oncology

## 2024-06-04 NOTE — Progress Notes (Incomplete)
 " Radiation Oncology         [phone] ________________________________  Name: Kyle Lewis        MRN: [id number]  Date of Service: 06/06/2024 DOB: 1945/08/15  CC:Pcp, No  Hawley, Warren BRAVO, NP     REFERRING PHYSICIAN: Cathe Warren BRAVO, NP   DIAGNOSIS: The encounter diagnosis was Osteoarthritis of right glenohumeral joint. M19.011   HISTORY OF PRESENT ILLNESS: Kyle Lewis is a 78 y.o. male seen in consultation for radiation therapy.  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     PREVIOUS RADIATION THERAPY: {EXAM; YES/NO:19492::No}  AUTOIMMUNE DISEASE: {EXAM; YES/NO:19492::No}  MEDICAL DEVICES: {EXAM; YES/NO:19492::No}  PREGNANCY: {EXAM; YES/NO:19492::No}   PAST MEDICAL HISTORY:  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   Abfraction    Abnormal gait 07/31/2019   Abscess of left leg    Accelerated hypertension 12/01/2021   Accretions on teeth    Acute on chronic diastolic heart failure (HCC)    Acute respiratory failure with hypoxemia (HCC) 04/07/2022   Acute respiratory failure with hypoxia (HCC) 12/01/2021   Acute respiratory failure with hypoxia and hypercapnia (HCC) 05/27/2021   HC03   06/14/21    =  31  - 06/23/2021   Walked 250 ft  at a slow pace with a cane. Complained of back and left knee hurting with lowest sat 89% so d/c'd 02    Adenomatous colon polyp    Adrenal mass    2 benign appearing left adrenal adenomas noted on 01/13/15 CT   Allergy    seasonal   Anxiety 04/08/2020   Arthritis    Atherosclerosis of aorta 04/03/2022   Blister of multiple sites of lower extremity 01/10/2018   Body mass index (BMI) 34.0-34.9, adult 04/07/2020   BPH with obstruction/lower urinary tract symptoms    Overview:  Probable based on symptoms   CAD (coronary artery disease) 02/01/2018    CAP (community acquired pneumonia) 11/30/2021   Caries    Carotid arterial disease 05/23/2012   Carotid artery occlusion    Chronic back pain    Chronic diastolic CHF (congestive heart failure) (HCC)    Chronic periodontitis    Cigarette smoker 09/05/2018   COPD exacerbation (HCC) 04/06/2022   COPD GOLD II 04/07/2019   Quit smoking 03/2019 - PFT's  04/07/2019  FEV1 1.58 (57 % ) ratio 0.54  p 0 % improvement from saba p nothing prior to study with DLCO  15.22 (66%) corrects to 3.05 (74%)  for alv volume and FV curve concave classically     COPD with acute exacerbation (HCC) 12/01/2021   Degeneration of cervical intervertebral disc    Degeneration of lumbar intervertebral disc 06/14/2015   Depression 03/17/2010   Qualifier: Diagnosis of  By: Nelson-Smith CMA (AAMA), Dottie     Discitis 09/29/2022   Diverticulosis    Diverticulosis of colon 03/17/2010   Qualifier: Diagnosis of   By: Marcelo CMA LEODIS), Dottie      IMO SNOMED Dx Update Oct 2024     Dyspnea    Encounter for dental examination    Epidural abscess 01/26/2022   Excessive dental attrition    Exogenous obesity 11/08/2013   Overview:  With a nine pound weight gain since his last visit   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   H/O abdominal aortic aneurysm  repair 11/09/2021   Heart murmur    History of back surgery    Rods and Screws in back   History of bacteremia 04/06/2022   History of colonic polyps 03/17/2010   Qualifier: Diagnosis of   By: Marcelo CMA LEODIS), Dottie      IMO SNOMED Dx Update Oct 2024     History of craniotomy 01/11/2018   History of left knee replacement    History of right MCA stroke 07/21/2014   History of tobacco abuse 11/08/2013   Hyperlipidemia LDL goal <70 01/19/2018   Hypertension    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 harvey and 06/10/2015 last visit   Impairment of balance 10/27/2021    Inflammation of sacroiliac joint 05/06/2019   Lower urinary tract symptoms (LUTS) 08/06/2017   Meningioma (HCC)    Microscopic hematuria 11/08/2013   Mitral regurgitation 06/02/2021   Muscle weakness 10/27/2021   Neck pain 03/25/2019   OA (osteoarthritis) of knee 04/01/2012   OAB (overactive bladder) 01/19/2014   Olecranon bursitis of left elbow 11/08/2021   OSA (obstructive sleep apnea)    Bipap per Chodri since ? 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 Verlin & Dr. Lucas   Severe aortic stenosis 02/01/2018   ECHO 01/30/18 - Left ventricle: The cavity size was normal. There was moderate   focal basal hypertrophy of the septum. Systolic function was   vigorous. The estimated ejection fraction was in the range of 65%   to 70%. Wall motion was normal; there were no regional wall   motion abnormalities. Doppler parameters are consistent with   abnormal left ventricular relaxation (grade 1 diastolic   dysfun   Status post AAA (abdominal aortic aneurysm) repair 02/01/2018   Stroke (HCC) 2013   tia no residual deficit from   Teeth missing    Ventral hernia 11/08/2013   Overview:  S/p repair with alloderm mesh and s/p SB resection       PAST SURGICAL HISTORY: Past Surgical History:  Procedure Laterality Date   ABDOMINAL AORTIC ANEURYSM REPAIR  2019   [facility]   APPENDECTOMY  as child    BACK SURGERY  2018   Rods and Screws in Back lower back   BRAIN MENINGIOMA EXCISION  1991   menigioma   CARPAL TUNNEL RELEASE     left   EYE SURGERY Bilateral    Cataract   IRRIGATION AND DEBRIDEMENT ABSCESS Left 08/25/2020   Procedure: IRRIGATION AND DEBRIDEMENT ABSCESS LEFT LEG;  Surgeon: Gretel Ozell PARAS, DPM;  Location: [facility];  Service: Podiatry;  Laterality: Left;   JOINT REPLACEMENT Left 04-01-12   Knee   LEFT HEART CATH AND CORONARY ANGIOGRAPHY N/A 09/15/2016   Procedure: Left Heart  Cath and Coronary Angiography;  Surgeon: Lonni Hanson, MD;  Location: [facility] INVASIVE CV LAB;  Service: Cardiovascular;  Laterality: N/A;   MAXIMUM ACCESS (MAS)POSTERIOR LUMBAR INTERBODY FUSION (PLIF) 1 LEVEL N/A 07/07/2015   Procedure:  POSTERIOR LUMBAR INTERBODY FUSION (PLIF) Lumbar Four-Five with Pedicle Screw Fixation Lumbar Two-Five;Laminectomy Lumbar Two-Five;  Surgeon: Alm GORMAN Molt, MD;  Location: [facility] NEURO ORS;  Service: Neurosurgery;  Laterality: N/A;   POSTERIOR LUMBAR INTERBODY FUSION (PLIF) Lumbar Four-Five with Pedicle Screw Fixation Lumbar Two-Five;Laminectomy Lumbar Two-Five   RIGHT/LEFT HEART CATH AND CORONARY ANGIOGRAPHY N/A 01/24/2018   Procedure: RIGHT/LEFT HEART CATH AND CORONARY ANGIOGRAPHY;  Surgeon: Hanson Lonni, MD;  Location: [facility] INVASIVE CV LAB;  Service: Cardiovascular;  Laterality: N/A;   RIGHT/LEFT HEART CATH AND CORONARY ANGIOGRAPHY N/A 05/31/2021   Procedure: RIGHT/LEFT HEART CATH AND CORONARY ANGIOGRAPHY;  Surgeon: Verlin Lonni BIRCH, MD;  Location: [facility] INVASIVE CV LAB;  Service: Cardiovascular;  Laterality: N/A;   TEE WITHOUT CARDIOVERSION N/A 06/07/2021   Procedure: TRANSESOPHAGEAL ECHOCARDIOGRAM (TEE);  Surgeon: Verlin Lonni BIRCH, MD;  Location: [facility] OR;  Service: Open Heart Surgery;  Laterality: N/A;   TEE WITHOUT CARDIOVERSION N/A 12/08/2021   Procedure: TRANSESOPHAGEAL ECHOCARDIOGRAM (TEE);  Surgeon: Sheena Pugh, DO;  Location: [facility] ENDOSCOPY;   Service: Cardiovascular;  Laterality: N/A;   TONSILLECTOMY  as child   TOTAL KNEE ARTHROPLASTY  04/01/2012   Procedure: TOTAL KNEE ARTHROPLASTY;  Surgeon: Dempsey LULLA Moan, MD;  Location: [facility] ORS;  Service: Orthopedics;  Laterality: Left;   TRANSCATHETER AORTIC VALVE REPLACEMENT, TRANSFEMORAL Bilateral 06/07/2021   Procedure: TRANSCATHETER AORTIC VALVE REPLACEMENT, RIGHT TRANSFEMORAL;  Surgeon: Verlin Lonni BIRCH, MD;  Location: [facility] OR;  Service: Open Heart Surgery;  Laterality: Bilateral;   TRANSURETHRAL RESECTION OF PROSTATE     ULTRASOUND GUIDANCE FOR VASCULAR ACCESS Bilateral 06/07/2021   Procedure: ULTRASOUND GUIDANCE FOR VASCULAR ACCESS;  Surgeon: Verlin Lonni BIRCH, MD;  Location: [facility] OR;  Service: Open Heart Surgery;  Laterality: Bilateral;   UMBILICAL HERNIA REPAIR  2011     FAMILY HISTORY:  Family History  Problem Relation Age of Onset   Heart disease Mother        Onset ~40 y/o   Hypertension Mother        Deceased from old age at 57   Hyperlipidemia Mother    Arthritis Mother    Diabetes Father        Deceased from old age at 6   Heart attack Father    Heart disease Father        CABG at age 11   Dementia Father 20   Arthritis Father    Prostate cancer Maternal Grandfather    Colon cancer Neg Hx    Esophageal cancer Neg Hx    Rectal cancer Neg Hx    Stomach cancer Neg Hx      SOCIAL HISTORY:  reports that he quit smoking about 3 years ago. His smoking use included cigarettes. He started smoking about 43 years ago. He has a 30 pack-year smoking history. He has never used smokeless tobacco. He reports current alcohol use. He reports that he does not use drugs.   ALLERGIES: Cefazolin    MEDICATIONS:  Current Outpatient Medications  Medication Sig Dispense Refill   acetaminophen  (TYLENOL ) 500 MG tablet Take 1,000 mg by mouth every 6 (six) hours as needed for moderate pain.     ALPRAZolam (XANAX) 0.25 MG tablet Take 0.25 mg by mouth at bedtime as needed for  anxiety or sleep.  amLODipine  (NORVASC ) 10 MG tablet Take 1 tablet (10 mg total) by mouth daily. 90 tablet 1   amoxicillin  (AMOXIL ) 500 MG capsule Take 500 mg by mouth 2 (two) times daily.     finasteride  (PROSCAR ) 5 MG tablet Take 1 tablet (5 mg total) by mouth daily. 90 tablet 1   furosemide  (LASIX ) 20 MG tablet Take 1-2 tablets (20-40 mg total) by mouth as needed for fluid or edema. 90 tablet 3   ketoconazole  (NIZORAL ) 2 % cream Apply 1 Application topically daily. 60 g 0   metoprolol  tartrate (LOPRESSOR ) 25 MG tablet 1/2 tab to equal 12.5mg  dose with food Orally once a day for 30 days     oxyCODONE  (OXY IR/ROXICODONE ) 5 MG immediate release tablet Take 1 tablet (5 mg total) by mouth every 6 (six) hours as needed for severe pain. 60 tablet 0   pantoprazole (PROTONIX) 20 MG tablet TAKE 1 TABLET BY MOUTH DAILY 0.5 TO 1 HOUR BEFORE BREAKFAST Orally Once a day     sertraline  (ZOLOFT ) 50 MG tablet Take 1 tablet by mouth once daily 90 tablet 0   tamsulosin  (FLOMAX ) 0.4 MG CAPS capsule TAKE 1 CAPSULE BY MOUTH ONCE DAILY AFTER SUPPER 90 capsule 3   No current facility-administered medications for this visit.     REVIEW OF SYSTEMS: The patient reports that s***/he is doing well overall and a review of symptoms is otherwise negative.      PHYSICAL EXAM:  Wt Readings from Last 3 Encounters:  11/20/23 220 lb (99.8 kg)  09/12/23 220 lb (99.8 kg)  06/14/23 220 lb (99.8 kg)   Temp Readings from Last 3 Encounters:  05/15/24 98.3 F (36.8 C) (Temporal)  11/20/23 97.7 F (36.5 C) (Temporal)  06/04/23 98.2 F (36.8 C) (Oral)   BP Readings from Last 3 Encounters:  05/15/24 (!) 140/66  11/20/23 132/82  08/28/23 (!) 149/78   Pulse Readings from Last 3 Encounters:  05/15/24 62  11/20/23 64  08/28/23 61    /10   Physical Exam   ECOG = ***   LABORATORY DATA:  Lab Results  Component Value Date   WBC 6.4 06/04/2023   HGB 13.7 06/04/2023   HCT 40.4 06/04/2023   MCV 91.6 06/04/2023    PLT 189 06/04/2023   Lab Results  Component Value Date   NA 140 06/04/2023   K 3.9 06/04/2023   CL 102 06/04/2023   CO2 29 06/04/2023   Lab Results  Component Value Date   ALT 17 06/04/2023   AST 17 06/04/2023   ALKPHOS 74 06/04/2023   BILITOT 0.7 06/04/2023      RADIOGRAPHY:   CT Shoulder L WO Contrast 10/30/23:   FINDINGS: Mild centrilobular emphysema left lung. Visualized left lung is otherwise unremarkable. No fracture or dislocation. No erosions. Severe glenohumeral osteoarthritis. There is moderate loss of glenoid bony stump. Mild acromioclavicular osteoarthritis. Loose bodies in the axillary pouch measuring up to 2 cm. Mild to moderate bicipital tenosynovitis and mild/moderate bursal fluid.   IMPRESSION: Severe glenohumeral osteoarthritis. Moderate loss of glenoid bony stock.   Mild acromioclavicular osteoarthritis.   Loose bodies in the axillary pouch measuring up to 2 cm.   Mild changes of centrilobular emphysema. Further assessment of the lungs if clinically warranted.   Mild to moderate bicipital tenosynovitis and bursal fluid.   PATHOLOGY:     IMPRESSION/PLAN:   After a detailed discussion of the patients history, prior treatment response, and current goals, we reviewed the proposed protocol for LDRT  targeting the R shoulder. Our institutional approach follows a regimen of 3 Gy total, delivered in 6 fractions of 0.5 Gy on a Monday-Wednesday-Friday schedule over two weeks. This dosing is consistent with published guidelines and peer-reviewed data for non-malignant musculoskeletal conditions.  We reviewed:   Goals of care: symptom relief and improved mobility Expected timeline for therapeutic benefit: typically several weeks to months Potential risks, including rare but theoretical concerns regarding late tissue effects or secondary malignancy (risk estimated to be extremely low at this dose and age) Lack of immunosuppression and inactive  autoimmune history, minimizing concern for immune-mediated complications   My goal with low dose radiation therapy is to help reduce chronic joint pain and improve mobility, particularly for activities like ***. This treatment does not reverse joint damage, but it can calm the inflammation in and around the joint that contributes to pain. Based on published data, about 60-80% of patients with osteoarthritis experience meaningful pain relief within a few weeks to months after completing a short course of low-dose radiation. While not everyone responds, the majority of patients do report improvement, and the treatment is generally well tolerated.  The patient, ***, was agreeable to proceed. We will arrange CT simulation for planning and initiate treatment pending final review and setup. A follow-up visit will be scheduled approximately 8-12 weeks after completion of LDRT to assess clinical response.   We personally spent 60 minutes in this encounter including chart review, reviewing radiological studies, meeting face-to-face with the patient, entering orders and completing documentation.    Estefana HERO. Maritza, M.D.   "

## 2024-06-04 NOTE — Progress Notes (Incomplete)
 Site of osteoarthritis:M19.011 (ICD-10-CM) - Arthritis of right shoulder region   How long have you had pain?   What over the counter or prescription medications have you tried?   Does anything make the pain better or worse?       Ambulatory status? Walker? Wheelchair?: {VQI Ambulatory Status:20974}  SAFETY ISSUES: Prior radiation? {:18581} Pacemaker/ICD? {:18581} Possible current pregnancy? {:18581} Is the patient on methotrexate? {:18581}  Current Complaints / other details:  ***

## 2024-06-06 ENCOUNTER — Ambulatory Visit

## 2024-06-06 ENCOUNTER — Telehealth: Payer: Self-pay | Admitting: Radiation Oncology

## 2024-06-06 ENCOUNTER — Ambulatory Visit: Admitting: Radiation Oncology

## 2024-06-06 DIAGNOSIS — M19011 Primary osteoarthritis, right shoulder: Secondary | ICD-10-CM

## 2024-06-06 NOTE — Telephone Encounter (Signed)
 LVM to r/s missed appt with Dr. Maritza 1/2 per pt's spouse request.

## 2024-06-10 NOTE — Progress Notes (Signed)
 " Radiation Oncology         [phone] ________________________________  Name: Kyle Lewis        MRN: [id number]  Date of Service: 06/12/2024 DOB: 06/12/1945  CC:Pcp, No  Hawley, Warren BRAVO, NP     REFERRING PHYSICIAN: Cathe Warren BRAVO, NP   DIAGNOSIS: There were no encounter diagnoses. M19.011   HISTORY OF PRESENT ILLNESS: Kyle Lewis is a 78 y.o. male seen in consultation for radiation therapy.  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   He has suffered from arthritis for 10+ years and it is progressively disabling. His shoulders, hips, knees and back are all affected. He has had a total knee replacement on the L. The L shoulder and L hip are most severe. He is considering replacement surgery. He cannot raise his L arm above his shoulder due to limited ROM and pain. He uses a wheelchair due to pain in his L hip. He currently takes Aleve and oxycodone  for pain. He has received steroid shots in other joints but does not think he has tried them in his L shoulder or L hip.   He and his wife are concerned about his surgical candidacy and interested in non surgical options for relief.   He does having imaging from May of this year demonstrating severe glenohumeral osteoarthritis.  PREVIOUS RADIATION THERAPY: No  AUTOIMMUNE DISEASE: No  MEDICAL DEVICES: No  PREGNANCY: No   PAST MEDICAL HISTORY:  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   Abfraction    Abnormal gait 07/31/2019   Abscess of left leg    Accelerated hypertension 12/01/2021   Accretions on teeth    Acute on chronic diastolic heart failure (HCC)    Acute respiratory failure with hypoxemia (HCC) 04/07/2022   Acute respiratory failure with hypoxia (HCC) 12/01/2021   Acute respiratory failure with hypoxia and hypercapnia (HCC)  05/27/2021   HC03   06/14/21    =  31  - 06/23/2021   Walked 250 ft  at a slow pace with a cane. Complained of back and left knee hurting with lowest sat 89% so d/c'd 02    Adenomatous colon polyp    Adrenal mass    2 benign appearing left adrenal adenomas noted on 01/13/15 CT   Allergy    seasonal   Anxiety 04/08/2020   Arthritis    Atherosclerosis of aorta 04/03/2022   Blister of multiple sites of lower extremity 01/10/2018   Body mass index (BMI) 34.0-34.9, adult 04/07/2020   BPH with obstruction/lower urinary tract symptoms    Overview:  Probable based on symptoms   CAD (coronary artery disease) 02/01/2018   CAP (community acquired pneumonia) 11/30/2021   Caries    Carotid arterial disease 05/23/2012   Carotid artery occlusion    Chronic back pain    Chronic diastolic CHF (congestive heart failure) (HCC)    Chronic periodontitis    Cigarette smoker 09/05/2018   COPD exacerbation (HCC) 04/06/2022   COPD GOLD II 04/07/2019   Quit smoking 03/2019 - PFT's  04/07/2019  FEV1 1.58 (57 % ) ratio 0.54  p 0 % improvement from saba p nothing prior to study with DLCO  15.22 (66%) corrects to 3.05 (74%)  for alv volume and FV curve concave classically  COPD with acute exacerbation (HCC) 12/01/2021   Degeneration of cervical intervertebral disc    Degeneration of lumbar intervertebral disc 06/14/2015   Depression 03/17/2010   Qualifier: Diagnosis of  By: Nelson-Smith CMA (AAMA), Dottie     Discitis 09/29/2022   Diverticulosis    Diverticulosis of colon 03/17/2010   Qualifier: Diagnosis of   By: Marcelo CMA LEODIS), Dottie      IMO SNOMED Dx Update Oct 2024     Dyspnea    Encounter for dental examination    Epidural abscess 01/26/2022   Excessive dental attrition    Exogenous obesity 11/08/2013   Overview:  With a nine pound weight gain since his last visit   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   H/O abdominal aortic aneurysm repair 11/09/2021   Heart murmur    History of back surgery    Rods  and Screws in back   History of bacteremia 04/06/2022   History of colonic polyps 03/17/2010   Qualifier: Diagnosis of   By: Marcelo CMA LEODIS), Dottie      IMO SNOMED Dx Update Oct 2024     History of craniotomy 01/11/2018   History of left knee replacement    History of right MCA stroke 07/21/2014   History of tobacco abuse 11/08/2013   Hyperlipidemia LDL goal <70 01/19/2018   Hypertension    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 harvey and 06/10/2015 last visit   Impairment of balance 10/27/2021   Inflammation of sacroiliac joint 05/06/2019   Lower urinary tract symptoms (LUTS) 08/06/2017   Meningioma (HCC)    Microscopic hematuria 11/08/2013   Mitral regurgitation 06/02/2021   Muscle weakness 10/27/2021   Neck pain 03/25/2019   OA (osteoarthritis) of knee 04/01/2012   OAB (overactive bladder) 01/19/2014   Olecranon bursitis of left elbow 11/08/2021   OSA (obstructive sleep apnea)    Bipap per Chodri since ? 2018 - Download 09/05/2018 used > 4 h x > 92% of days and avg use 8 h with AHI 3.1 @ 6 ipap and 10 epap    Osteoarthritis of right glenohumeral joint 08/14/2017   Other specified postprocedural states 09/25/2017   Peripheral vascular disease    Pre-procedure lab exam 01/19/2018   Preop cardiovascular exam 10/24/2018   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   Pyogenic arthritis of shoulder region [facility]) 01/26/2022   Renal lesion 11/19/2017   Ringing in ear    (SLIGHT)   S/P lumbar spinal fusion 07/07/2015   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   s/p TAVR with a 29 mm Edwards S3U via the TF approach by Dr. Verlin & Dr. Lucas   Severe aortic stenosis 02/01/2018   ECHO 01/30/18 - Left ventricle: The cavity size was normal. There was moderate   focal basal hypertrophy of the septum. Systolic function was   vigorous. The estimated ejection  fraction was in the range of 65%   to 70%. Wall motion was normal; there were no regional wall   motion abnormalities. Doppler parameters are consistent with   abnormal left ventricular relaxation (grade 1 diastolic   dysfun   Status post AAA (abdominal aortic aneurysm) repair 02/01/2018   Stroke (HCC) 2013   tia no residual deficit from   Teeth missing    Ventral hernia 11/08/2013   Overview:  S/p repair with alloderm mesh and s/p SB resection       PAST SURGICAL  HISTORY: Past Surgical History:  Procedure Laterality Date   ABDOMINAL AORTIC ANEURYSM REPAIR  2019   [place] [facility]   APPENDECTOMY  as child   BACK SURGERY  2018   Rods and Screws in Back lower back   BRAIN MENINGIOMA EXCISION  1991   menigioma   CARPAL TUNNEL RELEASE     left   EYE SURGERY Bilateral    Cataract   IRRIGATION AND DEBRIDEMENT ABSCESS Left 08/25/2020   Procedure: IRRIGATION AND DEBRIDEMENT ABSCESS LEFT LEG;  Surgeon: Gretel Ozell PARAS, DPM;  Location: [facility];  Service: Podiatry;  Laterality: Left;   JOINT REPLACEMENT Left 04-01-12   Knee   LEFT HEART CATH AND CORONARY ANGIOGRAPHY N/A 09/15/2016   Procedure: Left Heart Cath and Coronary Angiography;  Surgeon: Lonni Hanson, MD;  Location: [facility] INVASIVE CV LAB;  Service: Cardiovascular;  Laterality: N/A;   MAXIMUM ACCESS (MAS)POSTERIOR LUMBAR INTERBODY FUSION (PLIF) 1 LEVEL N/A 07/07/2015   Procedure:  POSTERIOR LUMBAR INTERBODY FUSION (PLIF) Lumbar Four-Five with Pedicle Screw Fixation Lumbar Two-Five;Laminectomy Lumbar Two-Five;  Surgeon: Alm GORMAN Molt, MD;  Location: [facility] NEURO ORS;  Service: Neurosurgery;  Laterality: N/A;   POSTERIOR LUMBAR INTERBODY FUSION (PLIF) Lumbar Four-Five with Pedicle Screw Fixation Lumbar Two-Five;Laminectomy Lumbar Two-Five   RIGHT/LEFT HEART CATH AND CORONARY ANGIOGRAPHY N/A 01/24/2018   Procedure: RIGHT/LEFT HEART CATH AND CORONARY ANGIOGRAPHY;  Surgeon: Hanson Lonni, MD;  Location: [facility] INVASIVE CV LAB;  Service:  Cardiovascular;  Laterality: N/A;   RIGHT/LEFT HEART CATH AND CORONARY ANGIOGRAPHY N/A 05/31/2021   Procedure: RIGHT/LEFT HEART CATH AND CORONARY ANGIOGRAPHY;  Surgeon: Verlin Lonni BIRCH, MD;  Location: [facility] INVASIVE CV LAB;  Service: Cardiovascular;  Laterality: N/A;   TEE WITHOUT CARDIOVERSION N/A 06/07/2021   Procedure: TRANSESOPHAGEAL ECHOCARDIOGRAM (TEE);  Surgeon: Verlin Lonni BIRCH, MD;  Location[facility];  Service: Open Heart Surgery;  Laterality: N/A;   TEE WITHOUT CARDIOVERSION N/A 12/08/2021   Procedure: TRANSESOPHAGEAL ECHOCARDIOGRAM (TEE);  Surgeon: Sheena Pugh, DO;  Location: [facility] ENDOSCOPY;  Service: Cardiovascular;  Laterality: N/A;   TONSILLECTOMY  as child   TOTAL KNEE ARTHROPLASTY  04/01/2012   Procedure: TOTAL KNEE ARTHROPLASTY;  Surgeon: Dempsey LULLA Moan, MD;  Location: [facility] ORS;  Service: Orthopedics;  Laterality: Left;   TRANSCATHETER AORTIC VALVE REPLACEMENT, TRANSFEMORAL Bilateral 06/07/2021   Procedure: TRANSCATHETER AORTIC VALVE REPLACEMENT, RIGHT TRANSFEMORAL;  Surgeon: Verlin Lonni BIRCH, MD;  Location: [facility] OR;  Service: Open Heart Surgery;  Laterality: Bilateral;   TRANSURETHRAL RESECTION OF PROSTATE     ULTRASOUND GUIDANCE FOR VASCULAR ACCESS Bilateral 06/07/2021   Procedure: ULTRASOUND GUIDANCE FOR VASCULAR ACCESS;  Surgeon: Verlin Lonni BIRCH, MD;  Location: [facility] OR;  Service: Open Heart Surgery;  Laterality: Bilateral;   UMBILICAL HERNIA REPAIR  2011     FAMILY HISTORY:  Family History  Problem Relation Age of Onset   Heart disease Mother        Onset ~62 y/o   Hypertension Mother        Deceased from old age at 19   Hyperlipidemia Mother    Arthritis Mother    Diabetes Father        Deceased from old age at 58   Heart attack Father    Heart disease Father        CABG at age 57   Dementia Father 15   Arthritis Father    Prostate cancer Maternal Grandfather    Colon cancer Neg Hx    Esophageal cancer Neg Hx    Rectal  cancer Neg Hx    Stomach  cancer Neg Hx      SOCIAL HISTORY:  reports that he quit smoking about 3 years ago. His smoking use included cigarettes. He started smoking about 43 years ago. He has a 30 pack-year smoking history. He has never used smokeless tobacco. He reports current alcohol use. He reports that he does not use drugs.   ALLERGIES: Cefazolin    MEDICATIONS:  Current Outpatient Medications  Medication Sig Dispense Refill   acetaminophen  (TYLENOL ) 500 MG tablet Take 1,000 mg by mouth every 6 (six) hours as needed for moderate pain.     ALPRAZolam (XANAX) 0.25 MG tablet Take 0.25 mg by mouth at bedtime as needed for anxiety or sleep.     amLODipine  (NORVASC ) 10 MG tablet Take 1 tablet (10 mg total) by mouth daily. 90 tablet 1   amoxicillin  (AMOXIL ) 500 MG capsule Take 500 mg by mouth 2 (two) times daily. (Patient not taking: Reported on 06/12/2024)     finasteride  (PROSCAR ) 5 MG tablet Take 1 tablet (5 mg total) by mouth daily. 90 tablet 1   furosemide  (LASIX ) 20 MG tablet Take 1-2 tablets (20-40 mg total) by mouth as needed for fluid or edema. 90 tablet 3   ketoconazole  (NIZORAL ) 2 % cream Apply 1 Application topically daily. 60 g 0   metoprolol  tartrate (LOPRESSOR ) 25 MG tablet 1/2 tab to equal 12.5mg  dose with food Orally once a day for 30 days     oxyCODONE  (OXY IR/ROXICODONE ) 5 MG immediate release tablet Take 1 tablet (5 mg total) by mouth every 6 (six) hours as needed for severe pain. 60 tablet 0   pantoprazole (PROTONIX) 20 MG tablet TAKE 1 TABLET BY MOUTH DAILY 0.5 TO 1 HOUR BEFORE BREAKFAST Orally Once a day     sertraline  (ZOLOFT ) 50 MG tablet Take 1 tablet by mouth once daily 90 tablet 0   tamsulosin  (FLOMAX ) 0.4 MG CAPS capsule TAKE 1 CAPSULE BY MOUTH ONCE DAILY AFTER SUPPER 90 capsule 3   No current facility-administered medications for this encounter.     REVIEW OF SYSTEMS: ROS per HPI and otherwise negative.      PHYSICAL EXAM:  Wt Readings from Last 3 Encounters:  06/12/24 216 lb 3.2  oz (98.1 kg)  11/20/23 220 lb (99.8 kg)  09/12/23 220 lb (99.8 kg)   Temp Readings from Last 3 Encounters:  06/12/24 97.7 F (36.5 C)  05/15/24 98.3 F (36.8 C) (Temporal)  11/20/23 97.7 F (36.5 C) (Temporal)   BP Readings from Last 3 Encounters:  06/12/24 (!) 147/93  05/15/24 (!) 140/66  11/20/23 132/82   Pulse Readings from Last 3 Encounters:  06/12/24 (!) 58  05/15/24 62  11/20/23 64   Pain Assessment Pain Score: 6  Pain Loc: Shoulder/10   Physical Exam Vitals and nursing note reviewed.  Constitutional:      General: He is not in acute distress. HENT:     Head: Normocephalic.     Comments: Various scabs on visible scalp, bruising over visible R face (attributed to a fall) Eyes:     Extraocular Movements: Extraocular movements intact.  Cardiovascular:     Rate and Rhythm: Bradycardia present.  Pulmonary:     Effort: Pulmonary effort is normal. No respiratory distress.  Abdominal:     General: There is no distension.  Musculoskeletal:        General: No swelling or tenderness (no tenderness to palpation of L shoulder and hip joints).  Cervical back: Normal range of motion.     Comments: Limited LUE ROM, cannot raise arm above shoulder/head  Skin:    General: Skin is warm and dry.     Findings: Bruising (on R face) and lesion (various 0.5 - 1.0 cm lesions on scalp that are raised, erythematous and somewhat nodular appearing) present.  Neurological:     General: No focal deficit present.     Mental Status: He is alert.     Cranial Nerves: No cranial nerve deficit.     Comments: Requires wheelchair for ambulation  Psychiatric:        Mood and Affect: Mood normal.     LABORATORY DATA:  Lab Results  Component Value Date   WBC 6.4 06/04/2023   HGB 13.7 06/04/2023   HCT 40.4 06/04/2023   MCV 91.6 06/04/2023   PLT 189 06/04/2023   Lab Results  Component Value Date   NA 140 06/04/2023   K 3.9 06/04/2023   CL 102 06/04/2023   CO2 29 06/04/2023    Lab Results  Component Value Date   ALT 17 06/04/2023   AST 17 06/04/2023   ALKPHOS 74 06/04/2023   BILITOT 0.7 06/04/2023      RADIOGRAPHY:   CT Shoulder L WO Contrast 10/30/23:   FINDINGS: Mild centrilobular emphysema left lung. Visualized left lung is otherwise unremarkable. No fracture or dislocation. No erosions. Severe glenohumeral osteoarthritis. There is moderate loss of glenoid bony stump. Mild acromioclavicular osteoarthritis. Loose bodies in the axillary pouch measuring up to 2 cm. Mild to moderate bicipital tenosynovitis and mild/moderate bursal fluid.   IMPRESSION: Severe glenohumeral osteoarthritis. Moderate loss of glenoid bony stock.   Mild acromioclavicular osteoarthritis.   Loose bodies in the axillary pouch measuring up to 2 cm.   Mild changes of centrilobular emphysema. Further assessment of the lungs if clinically warranted.   Mild to moderate bicipital tenosynovitis and bursal fluid.   PATHOLOGY: no relevant pathology     IMPRESSION/PLAN:  Mr. Mines is a 79 yo M w/ severe osteoarthritis of the L hip and L shoulder not responsive to conservative measures. He is wary about undergoing surgery given his other medical comorbidities and is interested in a non-surgical treatment.   After a detailed discussion of the patients history, prior treatment response, and current goals, we reviewed the proposed protocol for LDRT targeting the L shoulder and L hip. Our institutional approach follows a regimen of 3 Gy total, delivered in 6 fractions of 0.5 Gy per fraction over 2-3 weeks. This dosing is consistent with published guidelines and peer-reviewed data for non-malignant musculoskeletal conditions.  We reviewed:   Goals of care: symptom relief and improved mobility Expected timeline for therapeutic benefit: typically several weeks to months Potential risks, including rare but theoretical concerns regarding late tissue effects or secondary malignancy  (risk estimated to be extremely low at this dose and age) Lack of immunosuppression and inactive autoimmune history, minimizing concern for immune-mediated complications   My goal with low dose radiation therapy is to help reduce chronic joint pain and improve mobility, particularly for activities like walking and other activities of independent living. This treatment does not reverse joint damage, but it can calm the inflammation in and around the joint that contributes to pain. Based on published data, about 60-80% of patients with osteoarthritis experience meaningful pain relief within a few weeks to months after completing a short course of low-dose radiation. While not everyone responds, the majority of patients do report improvement, and  the treatment is generally well tolerated.  The patient was agreeable to proceed. We will arrange CT simulation for planning and initiate treatment pending final review and setup. A follow-up visit will be scheduled approximately 8-12 weeks after completion of LDRT to assess clinical response.   We personally spent 60 minutes in this encounter including chart review, reviewing radiological studies, meeting face-to-face with the patient, entering orders and completing documentation.    Estefana HERO. Maritza, M.D.   "

## 2024-06-12 ENCOUNTER — Encounter: Payer: Self-pay | Admitting: Radiation Oncology

## 2024-06-12 ENCOUNTER — Ambulatory Visit
Admission: RE | Admit: 2024-06-12 | Discharge: 2024-06-12 | Disposition: A | Discharge status: Home / Self Care | Source: Ambulatory Visit | Attending: Radiation Oncology | Admitting: Radiation Oncology

## 2024-06-12 VITALS — BP 147/93 | HR 58 | Temp 97.7°F | Resp 20 | Ht 67.0 in | Wt 216.2 lb

## 2024-06-12 DIAGNOSIS — I739 Peripheral vascular disease, unspecified: Secondary | ICD-10-CM | POA: Diagnosis not present

## 2024-06-12 DIAGNOSIS — Z87891 Personal history of nicotine dependence: Secondary | ICD-10-CM | POA: Insufficient documentation

## 2024-06-12 DIAGNOSIS — I5033 Acute on chronic diastolic (congestive) heart failure: Secondary | ICD-10-CM | POA: Insufficient documentation

## 2024-06-12 DIAGNOSIS — I38 Endocarditis, valve unspecified: Secondary | ICD-10-CM | POA: Insufficient documentation

## 2024-06-12 DIAGNOSIS — Z8673 Personal history of transient ischemic attack (TIA), and cerebral infarction without residual deficits: Secondary | ICD-10-CM | POA: Diagnosis not present

## 2024-06-12 DIAGNOSIS — Z79899 Other long term (current) drug therapy: Secondary | ICD-10-CM | POA: Diagnosis not present

## 2024-06-12 DIAGNOSIS — I714 Abdominal aortic aneurysm, without rupture, unspecified: Secondary | ICD-10-CM | POA: Insufficient documentation

## 2024-06-12 DIAGNOSIS — T826XXA Infection and inflammatory reaction due to cardiac valve prosthesis, initial encounter: Secondary | ICD-10-CM | POA: Diagnosis not present

## 2024-06-12 DIAGNOSIS — I700 Atherosclerosis of aorta: Secondary | ICD-10-CM | POA: Diagnosis not present

## 2024-06-12 DIAGNOSIS — I340 Nonrheumatic mitral (valve) insufficiency: Secondary | ICD-10-CM | POA: Diagnosis not present

## 2024-06-12 DIAGNOSIS — Z860101 Personal history of adenomatous and serrated colon polyps: Secondary | ICD-10-CM | POA: Insufficient documentation

## 2024-06-12 DIAGNOSIS — Z9981 Dependence on supplemental oxygen: Secondary | ICD-10-CM | POA: Insufficient documentation

## 2024-06-12 DIAGNOSIS — M1612 Unilateral primary osteoarthritis, left hip: Secondary | ICD-10-CM | POA: Diagnosis present

## 2024-06-12 DIAGNOSIS — I110 Hypertensive heart disease with heart failure: Secondary | ICD-10-CM | POA: Insufficient documentation

## 2024-06-12 DIAGNOSIS — I350 Nonrheumatic aortic (valve) stenosis: Secondary | ICD-10-CM | POA: Diagnosis not present

## 2024-06-12 DIAGNOSIS — I10 Essential (primary) hypertension: Secondary | ICD-10-CM | POA: Diagnosis not present

## 2024-06-12 DIAGNOSIS — Z8042 Family history of malignant neoplasm of prostate: Secondary | ICD-10-CM | POA: Insufficient documentation

## 2024-06-12 DIAGNOSIS — M19012 Primary osteoarthritis, left shoulder: Secondary | ICD-10-CM | POA: Insufficient documentation

## 2024-06-12 DIAGNOSIS — K5730 Diverticulosis of large intestine without perforation or abscess without bleeding: Secondary | ICD-10-CM | POA: Insufficient documentation

## 2024-06-12 DIAGNOSIS — I2510 Atherosclerotic heart disease of native coronary artery without angina pectoris: Secondary | ICD-10-CM | POA: Insufficient documentation

## 2024-06-12 DIAGNOSIS — Z8679 Personal history of other diseases of the circulatory system: Secondary | ICD-10-CM | POA: Insufficient documentation

## 2024-06-12 DIAGNOSIS — G4733 Obstructive sleep apnea (adult) (pediatric): Secondary | ICD-10-CM | POA: Insufficient documentation

## 2024-06-12 DIAGNOSIS — M7520 Bicipital tendinitis, unspecified shoulder: Secondary | ICD-10-CM | POA: Insufficient documentation

## 2024-06-12 DIAGNOSIS — M19011 Primary osteoarthritis, right shoulder: Secondary | ICD-10-CM | POA: Insufficient documentation

## 2024-06-12 DIAGNOSIS — I5032 Chronic diastolic (congestive) heart failure: Secondary | ICD-10-CM | POA: Diagnosis not present

## 2024-06-12 DIAGNOSIS — J432 Centrilobular emphysema: Secondary | ICD-10-CM | POA: Diagnosis not present

## 2024-06-12 NOTE — Progress Notes (Signed)
 Site of osteoarthritis:  How long have you had pain? Approximately 10 years  What over the counter or prescription medications have you tried? Aleve, Tylenol  and corticosteroid injections.  Does anything make the pain better or worse? Getting out of the bed makes it worse.      Ambulatory status? Walker? Wheelchair?: Wheelchair  SAFETY ISSUES: Prior radiation? No Pacemaker/ICD? No Possible current pregnancy? Patient is male. Is the patient on methotrexate? No  BP Readings from Last 4 Encounters: 06/13/23          147/93  05/15/24 (!) 140/66  11/20/23 132/82  08/28/23 (!) 149/78   Wt Readings from Last 4 Encounters: 06/13/23          216.2 lb  11/20/23 220 lb (99.8 kg)  09/12/23 220 lb (99.8 kg)  06/14/23 220 lb (99.8 kg)   Current Complaints / other details:    BP (!) 147/93 (BP Location: Left Arm, Patient Position: Sitting, Cuff Size: Large)   Pulse (!) 58   Temp 97.7 F (36.5 C)   Resp 20   Ht 5' 7 (1.702 m)   Wt 216 lb 3.2 oz (98.1 kg)   SpO2 92%   BMI 33.86 kg/m

## 2024-06-16 ENCOUNTER — Ambulatory Visit
Admission: RE | Admit: 2024-06-16 | Discharge: 2024-06-16 | Disposition: A | Discharge status: Home / Self Care | Source: Ambulatory Visit | Attending: Radiation Oncology | Admitting: Radiation Oncology

## 2024-06-16 DIAGNOSIS — Z510 Encounter for antineoplastic radiation therapy: Secondary | ICD-10-CM | POA: Insufficient documentation

## 2024-06-16 DIAGNOSIS — M1612 Unilateral primary osteoarthritis, left hip: Secondary | ICD-10-CM | POA: Insufficient documentation

## 2024-06-16 DIAGNOSIS — M19012 Primary osteoarthritis, left shoulder: Secondary | ICD-10-CM | POA: Insufficient documentation

## 2024-06-16 DIAGNOSIS — M19011 Primary osteoarthritis, right shoulder: Secondary | ICD-10-CM | POA: Insufficient documentation

## 2024-06-19 ENCOUNTER — Ambulatory Visit: Admission: RE | Admit: 2024-06-19 | Admitting: Radiation Oncology

## 2024-06-19 ENCOUNTER — Ambulatory Visit: Admitting: Radiation Oncology

## 2024-06-23 ENCOUNTER — Ambulatory Visit

## 2024-06-23 ENCOUNTER — Other Ambulatory Visit: Payer: Self-pay

## 2024-06-23 ENCOUNTER — Ambulatory Visit
Admission: RE | Admit: 2024-06-23 | Discharge: 2024-06-23 | Disposition: A | Discharge status: Home / Self Care | Source: Ambulatory Visit | Attending: Radiation Oncology | Admitting: Radiation Oncology

## 2024-06-23 LAB — RAD ONC ARIA SESSION SUMMARY
Course Elapsed Days: 0
Plan Fractions Treated to Date: 1
Plan Fractions Treated to Date: 1
Plan Prescribed Dose Per Fraction: 0.5 Gy
Plan Prescribed Dose Per Fraction: 0.5 Gy
Plan Total Fractions Prescribed: 6
Plan Total Fractions Prescribed: 6
Plan Total Prescribed Dose: 3 Gy
Plan Total Prescribed Dose: 3 Gy
Reference Point Dosage Given to Date: 0.5 Gy
Reference Point Dosage Given to Date: 0.5 Gy
Reference Point Session Dosage Given: 0.5 Gy
Reference Point Session Dosage Given: 0.5 Gy
Session Number: 1

## 2024-06-25 ENCOUNTER — Ambulatory Visit
Admission: RE | Admit: 2024-06-25 | Discharge: 2024-06-25 | Disposition: A | Source: Ambulatory Visit | Attending: Radiation Oncology

## 2024-06-25 ENCOUNTER — Other Ambulatory Visit: Payer: Self-pay

## 2024-06-25 LAB — RAD ONC ARIA SESSION SUMMARY
Course Elapsed Days: 2
Plan Fractions Treated to Date: 2
Plan Fractions Treated to Date: 2
Plan Prescribed Dose Per Fraction: 0.5 Gy
Plan Prescribed Dose Per Fraction: 0.5 Gy
Plan Total Fractions Prescribed: 6
Plan Total Fractions Prescribed: 6
Plan Total Prescribed Dose: 3 Gy
Plan Total Prescribed Dose: 3 Gy
Reference Point Dosage Given to Date: 1 Gy
Reference Point Dosage Given to Date: 1 Gy
Reference Point Session Dosage Given: 0.5 Gy
Reference Point Session Dosage Given: 0.5 Gy
Session Number: 2

## 2024-06-26 ENCOUNTER — Ambulatory Visit

## 2024-06-26 ENCOUNTER — Telehealth: Payer: Self-pay | Admitting: Internal Medicine

## 2024-06-26 ENCOUNTER — Other Ambulatory Visit: Payer: Self-pay

## 2024-06-26 DIAGNOSIS — M4646 Discitis, unspecified, lumbar region: Secondary | ICD-10-CM

## 2024-06-26 NOTE — Progress Notes (Unsigned)
 "    Virtual Visit via Video Note  I connected with Kyle Lewis on 06/26/24 at  3:30 PM EST by a video enabled telemedicine application and verified that I am speaking with the correct person using two identifiers.  Location: Patient: *** Provider: ***   I discussed the limitations of evaluation and management by telemedicine and the availability of in person appointments. The patient expressed understanding and agreed to proceed.  History of Present Illness:    Observations/Objective:   Assessment and Plan:   Follow Up Instructions:    I discussed the assessment and treatment plan with the patient. The patient was provided an opportunity to ask questions and all were answered. The patient agreed with the plan and demonstrated an understanding of the instructions.   The patient was advised to call back or seek an in-person evaluation if the symptoms worsen or if the condition fails to improve as anticipated.  I provided *** minutes of non-face-to-face time during this encounter.   Montie Bologna, MD   Patient ID: Kyle Lewis, male   DOB: April 07, 1946, 79 y.o.   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 = has been stable. No worsening of back pain.  Will get baseline labs Will check sed rate and crp. We can consider stopping abtx and monitoring off of abtx. Plan for follow up visit in 6 wk - video visitis/ arrange [facility] in [place]  Difficulty getting onto video  Still having diarrhea- 1-3x per day. Started on probiotic that made it   Outpatient Encounter Medications as of 06/26/2024  Medication Sig   acetaminophen  (TYLENOL ) 500 MG tablet Take 1,000 mg by mouth every 6 (six) hours as needed for moderate pain.   ALPRAZolam (XANAX) 0.25 MG tablet Take 0.25 mg by mouth at bedtime as needed for anxiety or sleep.   amLODipine  (NORVASC ) 10 MG tablet Take 1 tablet (10 mg total) by mouth daily.   amoxicillin   (AMOXIL ) 500 MG capsule Take 500 mg by mouth 2 (two) times daily. (Patient not taking: Reported on 06/12/2024)   finasteride  (PROSCAR ) 5 MG tablet Take 1 tablet (5 mg total) by mouth daily.   furosemide  (LASIX ) 20 MG tablet Take 1-2 tablets (20-40 mg total) by mouth as needed for fluid or edema.   ketoconazole  (NIZORAL ) 2 % cream Apply 1 Application topically daily.   metoprolol  tartrate (LOPRESSOR ) 25 MG tablet 1/2 tab to equal 12.5mg  dose with food Orally once a day for 30 days   oxyCODONE  (OXY IR/ROXICODONE ) 5 MG immediate release tablet Take 1 tablet (5 mg total) by mouth every 6 (six) hours as needed for severe pain.   pantoprazole (PROTONIX) 20 MG tablet TAKE 1 TABLET BY MOUTH DAILY 0.5 TO 1 HOUR BEFORE BREAKFAST Orally Once a day   sertraline  (ZOLOFT ) 50 MG tablet Take 1 tablet by mouth once daily   tamsulosin  (FLOMAX ) 0.4 MG CAPS capsule TAKE 1 CAPSULE BY MOUTH ONCE DAILY AFTER SUPPER   No facility-administered encounter medications on file as of 06/26/2024.     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   Acute respiratory failure with hypoxemia (HCC) 04/07/2022   COPD exacerbation (HCC) 04/06/2022   History of bacteremia 04/06/2022   Atherosclerosis of aorta  04/03/2022   Epidural abscess 01/26/2022   Pyogenic arthritis of shoulder region (HCC) 01/26/2022   Accretions on teeth    Chronic periodontitis    Teeth missing    Caries    Excessive dental attrition    Abfraction    Encounter for dental examination    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   Accelerated hypertension 12/01/2021   Acute respiratory failure with hypoxia (HCC) 12/01/2021   COPD with acute exacerbation (HCC) 12/01/2021   CAP (community acquired pneumonia) 11/30/2021   H/O abdominal aortic  aneurysm repair 11/09/2021   Olecranon bursitis of left elbow 11/08/2021   AAA (abdominal aortic aneurysm) 11/07/2021   Abscess of left leg 11/07/2021   Adrenal mass 11/07/2021   Allergy 11/07/2021   Arthritis 11/07/2021   Carotid artery occlusion 11/07/2021   Degeneration of cervical intervertebral disc 11/07/2021   Diverticulosis 11/07/2021   Dyspnea 11/07/2021   Heart murmur 11/07/2021   History of back surgery 11/07/2021   History of left knee replacement 11/07/2021   Meningioma (HCC) 11/07/2021   Ringing in ear 11/07/2021   Impairment of balance 10/27/2021   Muscle weakness 10/27/2021   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   Mitral regurgitation 06/02/2021   Chronic diastolic CHF (congestive heart failure) (HCC)    Acute respiratory failure with hypoxia and hypercapnia (HCC) 05/27/2021   Anxiety 04/08/2020   Body mass index (BMI) 34.0-34.9, adult 04/07/2020   Abnormal gait 07/31/2019   Inflammation of sacroiliac joint 05/06/2019   COPD GOLD II 04/07/2019   Neck pain 03/25/2019   Preop cardiovascular exam 10/24/2018   Cigarette smoker 09/05/2018   CAD (coronary artery disease) 02/01/2018   Severe aortic stenosis 02/01/2018   Status post AAA (abdominal aortic aneurysm) repair 02/01/2018   Pre-procedure lab exam 01/19/2018   Hyperlipidemia LDL goal <70 01/19/2018   History of craniotomy 01/11/2018   Blister of multiple sites of lower extremity 01/10/2018   Adenomatous colon polyp 01/10/2018   Renal lesion 11/19/2017   Other specified postprocedural states 09/25/2017   Osteoarthritis of right glenohumeral joint 08/14/2017   Lower urinary tract symptoms (LUTS) 08/06/2017   S/P lumbar spinal fusion 07/07/2015   Iliac artery aneurysm, left 07/01/2015   Peripheral vascular disease 07/01/2015   Degeneration of lumbar intervertebral disc 06/14/2015   History of right MCA stroke 07/21/2014   OAB (overactive bladder) 01/19/2014   Stroke (HCC)  01/01/2014   Exogenous obesity 11/08/2013   Hypertonicity of bladder 11/08/2013   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   Microscopic hematuria 11/08/2013   History of tobacco abuse 11/08/2013   Ventral hernia 11/08/2013   Carotid arterial disease (HCC) 05/23/2012   Hypertension    OSA (obstructive sleep apnea)    Chronic back pain    BPH with obstruction/lower urinary tract symptoms    OA (osteoarthritis) of knee 04/01/2012   Depression 03/17/2010   Diverticulosis of colon 03/17/2010   History of colonic polyps 03/17/2010     Health Maintenance Due  Topic Date Due   Medicare Annual Wellness (AWV)  Never done   Zoster Vaccines- Shingrix (1 of 2) Never done   DTaP/Tdap/Td (2 - Td or Tdap) 11/03/2017   Lung Cancer Screening  12/14/2022   Influenza Vaccine  01/04/2024   COVID-19 Vaccine (5 - 2025-26 season) 02/04/2024     Review of Systems  Physical Exam   There were no vitals taken for this visit.   No results found for: CD4TCELL No results found for: CD4TABS No results found for: HIV1RNAQUANT No  results found for: HEPBSAB Lab Results  Component Value Date   LABRPR Non Reactive 09/25/2019    CBC Lab Results  Component Value Date   WBC 6.4 06/04/2023   RBC 4.41 06/04/2023   HGB 13.7 06/04/2023   HCT 40.4 06/04/2023   PLT 189 06/04/2023   MCV 91.6 06/04/2023   MCH 31.1 06/04/2023   MCHC 33.9 06/04/2023   RDW 13.2 06/04/2023   LYMPHSABS 1.1 06/04/2023   MONOABS 0.4 06/04/2023   EOSABS 0.2 06/04/2023    BMET Lab Results  Component Value Date   NA 140 06/04/2023   K 3.9 06/04/2023   CL 102 06/04/2023   CO2 29 06/04/2023   GLUCOSE 108 (H) 06/04/2023   BUN 19 06/04/2023   CREATININE 0.71 06/04/2023   CALCIUM  8.8 (L) 06/04/2023   GFRNONAA >60 06/04/2023   GFRAA 107 04/08/2020      Assessment and Plan   Diarrhea = metamucil trial  Hx of gemella = track down labs from [facility]-- if normal, return if needed  "

## 2024-06-30 ENCOUNTER — Ambulatory Visit

## 2024-07-01 ENCOUNTER — Ambulatory Visit: Admitting: Radiation Oncology

## 2024-07-02 ENCOUNTER — Ambulatory Visit

## 2024-07-03 ENCOUNTER — Ambulatory Visit

## 2024-07-04 ENCOUNTER — Ambulatory Visit

## 2024-07-07 ENCOUNTER — Ambulatory Visit

## 2024-07-08 ENCOUNTER — Ambulatory Visit

## 2024-07-09 ENCOUNTER — Ambulatory Visit

## 2024-07-10 ENCOUNTER — Ambulatory Visit

## 2024-07-10 ENCOUNTER — Telehealth: Payer: Self-pay

## 2024-07-10 NOTE — Telephone Encounter (Signed)
 Pt was admitted to [facility] today. He had been IP @ [facility] for fall, and UTI. He was discharged to [facility] for 1 night, as [facility] didn't have bed until today. Pt told staff there he had radiation appt here tomorrow. Sherry,LPN, is calling to confirm appt. Call transferred to Woodie Sheron [facility] nurse.

## 2024-07-14 ENCOUNTER — Ambulatory Visit

## 2024-07-15 ENCOUNTER — Ambulatory Visit

## 2024-07-16 ENCOUNTER — Ambulatory Visit

## 2024-07-17 ENCOUNTER — Ambulatory Visit

## 2024-07-21 ENCOUNTER — Ambulatory Visit

## 2024-07-22 ENCOUNTER — Ambulatory Visit

## 2024-07-23 ENCOUNTER — Ambulatory Visit

## 2024-07-24 ENCOUNTER — Ambulatory Visit

## 2024-07-28 ENCOUNTER — Ambulatory Visit

## 2024-07-30 ENCOUNTER — Ambulatory Visit
